# Patient Record
Sex: Female | Born: 1935 | Race: White | Hispanic: No | Smoking: Never smoker
Health system: Southern US, Community
[De-identification: ages and names within clinical notes are randomized; demographics above are authoritative.]

## PROBLEM LIST (undated history)

## (undated) DIAGNOSIS — R739 Hyperglycemia, unspecified: Secondary | ICD-10-CM

## (undated) DIAGNOSIS — I8390 Asymptomatic varicose veins of unspecified lower extremity: Secondary | ICD-10-CM

## (undated) DIAGNOSIS — G629 Polyneuropathy, unspecified: Secondary | ICD-10-CM

## (undated) DIAGNOSIS — K225 Diverticulum of esophagus, acquired: Secondary | ICD-10-CM

## (undated) DIAGNOSIS — R42 Dizziness and giddiness: Secondary | ICD-10-CM

## (undated) DIAGNOSIS — K219 Gastro-esophageal reflux disease without esophagitis: Secondary | ICD-10-CM

## (undated) DIAGNOSIS — I6529 Occlusion and stenosis of unspecified carotid artery: Secondary | ICD-10-CM

## (undated) DIAGNOSIS — R0609 Other forms of dyspnea: Secondary | ICD-10-CM

## (undated) DIAGNOSIS — L299 Pruritus, unspecified: Secondary | ICD-10-CM

## (undated) DIAGNOSIS — I4891 Unspecified atrial fibrillation: Secondary | ICD-10-CM

## (undated) DIAGNOSIS — H35039 Hypertensive retinopathy, unspecified eye: Secondary | ICD-10-CM

## (undated) DIAGNOSIS — R1313 Dysphagia, pharyngeal phase: Secondary | ICD-10-CM

## (undated) DIAGNOSIS — R51 Headache: Secondary | ICD-10-CM

## (undated) DIAGNOSIS — Y92009 Unspecified place in unspecified non-institutional (private) residence as the place of occurrence of the external cause: Secondary | ICD-10-CM

## (undated) DIAGNOSIS — R238 Other skin changes: Secondary | ICD-10-CM

## (undated) DIAGNOSIS — W19XXXA Unspecified fall, initial encounter: Secondary | ICD-10-CM

## (undated) DIAGNOSIS — R140 Abdominal distension (gaseous): Secondary | ICD-10-CM

## (undated) DIAGNOSIS — M419 Scoliosis, unspecified: Secondary | ICD-10-CM

## (undated) DIAGNOSIS — R233 Spontaneous ecchymoses: Secondary | ICD-10-CM

## (undated) DIAGNOSIS — E039 Hypothyroidism, unspecified: Secondary | ICD-10-CM

## (undated) DIAGNOSIS — H3530 Unspecified macular degeneration: Secondary | ICD-10-CM

## (undated) DIAGNOSIS — R490 Dysphonia: Secondary | ICD-10-CM

## (undated) DIAGNOSIS — E7800 Pure hypercholesterolemia, unspecified: Secondary | ICD-10-CM

## (undated) HISTORY — DX: Pruritus, unspecified: L29.9

## (undated) HISTORY — DX: Pure hypercholesterolemia, unspecified: E78.00

## (undated) HISTORY — DX: Hyperglycemia, unspecified: R73.9

## (undated) HISTORY — DX: Dysphonia: R49.0

## (undated) HISTORY — DX: Dizziness and giddiness: R42

## (undated) HISTORY — DX: Gastro-esophageal reflux disease without esophagitis: K21.9

## (undated) HISTORY — DX: Polyneuropathy, unspecified: G62.9

## (undated) HISTORY — DX: Occlusion and stenosis of unspecified carotid artery: I65.29

## (undated) HISTORY — DX: Unspecified macular degeneration: H35.30

## (undated) HISTORY — DX: Unspecified fall, initial encounter: W19.XXXA

## (undated) HISTORY — DX: Asymptomatic varicose veins of unspecified lower extremity: I83.90

## (undated) HISTORY — DX: Other skin changes: R23.8

## (undated) HISTORY — PX: EYE SURGERY: SHX253

## (undated) HISTORY — DX: Headache: R51

## (undated) HISTORY — DX: Spontaneous ecchymoses: R23.3

## (undated) HISTORY — DX: Scoliosis, unspecified: M41.9

## (undated) HISTORY — DX: Hypothyroidism, unspecified: E03.9

## (undated) HISTORY — DX: Other forms of dyspnea: R06.09

## (undated) HISTORY — DX: Hypertensive retinopathy, unspecified eye: H35.039

## (undated) HISTORY — DX: Dysphagia, pharyngeal phase: R13.13

## (undated) HISTORY — DX: Diverticulum of esophagus, acquired: K22.5

## (undated) HISTORY — PX: CATARACT EXTRACTION: SUR2

## (undated) HISTORY — DX: Unspecified atrial fibrillation: I48.91

## (undated) HISTORY — DX: Unspecified place in unspecified non-institutional (private) residence as the place of occurrence of the external cause: Y92.009

## (undated) HISTORY — DX: Abdominal distension (gaseous): R14.0

---
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
## 1995-01-12 HISTORY — PX: CHOLECYSTECTOMY: SHX55

## 1995-01-12 HISTORY — PX: SPINE SURGERY: SHX786

## 1997-06-14 ENCOUNTER — Other Ambulatory Visit: Admission: RE | Admit: 1997-06-14 | Discharge: 1997-06-14 | Payer: Self-pay | Admitting: Cardiology

## 1997-10-14 ENCOUNTER — Inpatient Hospital Stay (HOSPITAL_COMMUNITY): Admission: EM | Admit: 1997-10-14 | Discharge: 1997-10-16 | Payer: Self-pay | Admitting: Cardiology

## 1998-12-29 ENCOUNTER — Encounter: Payer: Self-pay | Admitting: Cardiology

## 1998-12-29 ENCOUNTER — Encounter: Admission: RE | Admit: 1998-12-29 | Discharge: 1998-12-29 | Payer: Self-pay | Admitting: Cardiology

## 1999-01-07 ENCOUNTER — Encounter: Payer: Self-pay | Admitting: Cardiology

## 1999-01-07 ENCOUNTER — Encounter: Admission: RE | Admit: 1999-01-07 | Discharge: 1999-01-07 | Payer: Self-pay | Admitting: Cardiology

## 2000-02-09 ENCOUNTER — Encounter: Admission: RE | Admit: 2000-02-09 | Discharge: 2000-02-09 | Payer: Self-pay | Admitting: Cardiology

## 2000-02-09 ENCOUNTER — Encounter: Payer: Self-pay | Admitting: Cardiology

## 2000-05-18 ENCOUNTER — Ambulatory Visit (HOSPITAL_BASED_OUTPATIENT_CLINIC_OR_DEPARTMENT_OTHER): Admission: RE | Admit: 2000-05-18 | Discharge: 2000-05-18 | Payer: Self-pay | Admitting: Plastic Surgery

## 2000-05-18 ENCOUNTER — Encounter (INDEPENDENT_AMBULATORY_CARE_PROVIDER_SITE_OTHER): Payer: Self-pay | Admitting: *Deleted

## 2000-07-04 ENCOUNTER — Other Ambulatory Visit: Admission: RE | Admit: 2000-07-04 | Discharge: 2000-07-04 | Payer: Self-pay | Admitting: Cardiology

## 2001-03-30 ENCOUNTER — Encounter: Admission: RE | Admit: 2001-03-30 | Discharge: 2001-03-30 | Payer: Self-pay | Admitting: Cardiology

## 2001-03-30 ENCOUNTER — Encounter: Payer: Self-pay | Admitting: Cardiology

## 2002-04-16 ENCOUNTER — Encounter: Payer: Self-pay | Admitting: Cardiology

## 2002-04-16 ENCOUNTER — Encounter: Admission: RE | Admit: 2002-04-16 | Discharge: 2002-04-16 | Payer: Self-pay | Admitting: Cardiology

## 2002-09-19 ENCOUNTER — Encounter: Payer: Self-pay | Admitting: Cardiology

## 2002-09-19 ENCOUNTER — Encounter: Admission: RE | Admit: 2002-09-19 | Discharge: 2002-09-19 | Payer: Self-pay | Admitting: Cardiology

## 2003-09-24 ENCOUNTER — Encounter: Admission: RE | Admit: 2003-09-24 | Discharge: 2003-09-24 | Payer: Self-pay | Admitting: Cardiology

## 2003-10-01 ENCOUNTER — Encounter: Admission: RE | Admit: 2003-10-01 | Discharge: 2003-10-01 | Payer: Self-pay | Admitting: Cardiology

## 2004-11-03 ENCOUNTER — Encounter: Admission: RE | Admit: 2004-11-03 | Discharge: 2004-11-03 | Payer: Self-pay | Admitting: Cardiology

## 2005-02-01 ENCOUNTER — Ambulatory Visit: Payer: Self-pay | Admitting: Physical Medicine & Rehabilitation

## 2005-02-01 ENCOUNTER — Encounter
Admission: RE | Admit: 2005-02-01 | Discharge: 2005-05-02 | Payer: Self-pay | Admitting: Physical Medicine & Rehabilitation

## 2005-03-08 ENCOUNTER — Ambulatory Visit: Payer: Self-pay | Admitting: Physical Medicine & Rehabilitation

## 2005-03-19 ENCOUNTER — Ambulatory Visit: Payer: Self-pay | Admitting: Physical Medicine & Rehabilitation

## 2005-05-07 ENCOUNTER — Encounter
Admission: RE | Admit: 2005-05-07 | Discharge: 2005-05-07 | Payer: Self-pay | Admitting: Physical Medicine and Rehabilitation

## 2005-08-13 ENCOUNTER — Ambulatory Visit (HOSPITAL_COMMUNITY): Admission: RE | Admit: 2005-08-13 | Discharge: 2005-08-13 | Payer: Self-pay | Admitting: Cardiology

## 2006-01-06 ENCOUNTER — Encounter: Admission: RE | Admit: 2006-01-06 | Discharge: 2006-01-06 | Payer: Self-pay | Admitting: Cardiology

## 2006-04-12 ENCOUNTER — Encounter: Admission: RE | Admit: 2006-04-12 | Discharge: 2006-04-12 | Payer: Self-pay | Admitting: Cardiology

## 2006-07-01 ENCOUNTER — Ambulatory Visit: Payer: Self-pay | Admitting: Vascular Surgery

## 2007-01-24 ENCOUNTER — Ambulatory Visit: Payer: Self-pay | Admitting: Vascular Surgery

## 2007-02-09 ENCOUNTER — Encounter: Admission: RE | Admit: 2007-02-09 | Discharge: 2007-02-09 | Payer: Self-pay | Admitting: Cardiology

## 2007-07-26 ENCOUNTER — Encounter: Admission: RE | Admit: 2007-07-26 | Discharge: 2007-07-26 | Payer: Self-pay | Admitting: Gastroenterology

## 2008-01-30 ENCOUNTER — Ambulatory Visit: Payer: Self-pay | Admitting: Vascular Surgery

## 2008-03-20 ENCOUNTER — Encounter: Admission: RE | Admit: 2008-03-20 | Discharge: 2008-03-20 | Payer: Self-pay | Admitting: Cardiology

## 2008-07-03 ENCOUNTER — Encounter: Admission: RE | Admit: 2008-07-03 | Discharge: 2008-07-03 | Payer: Self-pay | Admitting: Orthopedic Surgery

## 2008-09-19 ENCOUNTER — Encounter: Admission: RE | Admit: 2008-09-19 | Discharge: 2008-09-19 | Payer: Self-pay | Admitting: Cardiology

## 2009-01-30 ENCOUNTER — Ambulatory Visit: Payer: Self-pay | Admitting: Vascular Surgery

## 2009-08-06 ENCOUNTER — Encounter: Admission: RE | Admit: 2009-08-06 | Discharge: 2009-08-06 | Payer: Self-pay | Admitting: Emergency Medicine

## 2009-09-01 ENCOUNTER — Ambulatory Visit: Payer: Self-pay | Admitting: Cardiology

## 2009-09-11 ENCOUNTER — Ambulatory Visit: Payer: Self-pay | Admitting: Cardiology

## 2009-10-01 ENCOUNTER — Encounter: Admission: RE | Admit: 2009-10-01 | Discharge: 2009-10-01 | Payer: Self-pay | Admitting: Cardiology

## 2009-10-06 ENCOUNTER — Ambulatory Visit: Payer: Self-pay | Admitting: Cardiology

## 2009-10-13 ENCOUNTER — Ambulatory Visit: Payer: Self-pay | Admitting: Cardiology

## 2009-10-28 ENCOUNTER — Ambulatory Visit: Payer: Self-pay | Admitting: Cardiology

## 2009-11-12 ENCOUNTER — Ambulatory Visit: Payer: Self-pay | Admitting: Cardiology

## 2009-11-27 ENCOUNTER — Ambulatory Visit: Payer: Self-pay | Admitting: Cardiology

## 2009-12-29 ENCOUNTER — Ambulatory Visit: Payer: Self-pay | Admitting: Cardiology

## 2010-01-21 ENCOUNTER — Ambulatory Visit
Admission: RE | Admit: 2010-01-21 | Discharge: 2010-01-21 | Payer: Self-pay | Source: Home / Self Care | Attending: Vascular Surgery | Admitting: Vascular Surgery

## 2010-01-29 ENCOUNTER — Ambulatory Visit: Payer: Self-pay | Admitting: Cardiology

## 2010-01-31 NOTE — Procedures (Unsigned)
CAROTID DUPLEX EXAM  INDICATION:  Followup carotid artery disease.  HISTORY: Diabetes:  No. Cardiac:  Yes. Hypertension:  Yes. Smoking:  No. Previous Surgery:  No. CV History:  The patient is currently asymptomatic. Amaurosis Fugax No, Paresthesias No, Hemiparesis No                                      RIGHT             LEFT Brachial systolic pressure:         132               134 Brachial Doppler waveforms:         WNL               WNL Vertebral direction of flow:        Antegrade DUPLEX VELOCITIES (cm/sec) CCA peak systolic                   62 ECA peak systolic                   85 ICA peak systolic                   106 ICA end diastolic                   35 PLAQUE MORPHOLOGY:                  Heterogenous PLAQUE AMOUNT:                      Mild PLAQUE LOCATION:                    ICA  IMPRESSION:  No hemodynamically significant stenosis on the right internal carotid artery.  Tortuous ICA could be secondary to known occlusion of the left internal carotid artery.  Study is stable compared to previous.         ___________________________________________ Di Kindle. Edilia Bo, M.D.  OD/MEDQ  D:  01/21/2010  T:  01/21/2010  Job:  [id number]

## 2010-02-02 ENCOUNTER — Encounter: Payer: Self-pay | Admitting: Gastroenterology

## 2010-02-25 ENCOUNTER — Encounter (INDEPENDENT_AMBULATORY_CARE_PROVIDER_SITE_OTHER): Payer: Medicare Other

## 2010-02-25 DIAGNOSIS — Z7901 Long term (current) use of anticoagulants: Secondary | ICD-10-CM

## 2010-02-25 DIAGNOSIS — I4891 Unspecified atrial fibrillation: Secondary | ICD-10-CM

## 2010-03-25 ENCOUNTER — Encounter (INDEPENDENT_AMBULATORY_CARE_PROVIDER_SITE_OTHER): Payer: Medicare Other

## 2010-03-25 DIAGNOSIS — Z7901 Long term (current) use of anticoagulants: Secondary | ICD-10-CM

## 2010-03-25 DIAGNOSIS — I4891 Unspecified atrial fibrillation: Secondary | ICD-10-CM

## 2010-04-01 ENCOUNTER — Ambulatory Visit (INDEPENDENT_AMBULATORY_CARE_PROVIDER_SITE_OTHER): Payer: Medicare Other | Admitting: *Deleted

## 2010-04-01 DIAGNOSIS — I4891 Unspecified atrial fibrillation: Secondary | ICD-10-CM

## 2010-04-01 DIAGNOSIS — Z7901 Long term (current) use of anticoagulants: Secondary | ICD-10-CM

## 2010-04-01 LAB — POCT INR: INR: 2.2

## 2010-04-13 ENCOUNTER — Telehealth: Payer: Self-pay | Admitting: Cardiology

## 2010-04-13 NOTE — Telephone Encounter (Signed)
PT SAID HAS COME CONCERN, STAGE PAINS IN THE PAST FEW DAYS AND SOME OTHER THINGS. WANTS TO TALK TO MELINDA.

## 2010-04-14 NOTE — Telephone Encounter (Signed)
Called patient back yesterday (system down) and Dr Patty Sermons will see 4/4

## 2010-04-15 ENCOUNTER — Encounter: Payer: Self-pay | Admitting: Cardiology

## 2010-04-15 ENCOUNTER — Ambulatory Visit (INDEPENDENT_AMBULATORY_CARE_PROVIDER_SITE_OTHER): Payer: Medicare Other | Admitting: Cardiology

## 2010-04-15 VITALS — BP 120/72 | HR 84 | Wt 120.0 lb

## 2010-04-15 DIAGNOSIS — G609 Hereditary and idiopathic neuropathy, unspecified: Secondary | ICD-10-CM

## 2010-04-15 DIAGNOSIS — R233 Spontaneous ecchymoses: Secondary | ICD-10-CM | POA: Insufficient documentation

## 2010-04-15 DIAGNOSIS — R238 Other skin changes: Secondary | ICD-10-CM | POA: Insufficient documentation

## 2010-04-15 DIAGNOSIS — I4891 Unspecified atrial fibrillation: Secondary | ICD-10-CM | POA: Insufficient documentation

## 2010-04-15 DIAGNOSIS — R490 Dysphonia: Secondary | ICD-10-CM | POA: Insufficient documentation

## 2010-04-15 DIAGNOSIS — R140 Abdominal distension (gaseous): Secondary | ICD-10-CM | POA: Insufficient documentation

## 2010-04-15 DIAGNOSIS — L299 Pruritus, unspecified: Secondary | ICD-10-CM | POA: Insufficient documentation

## 2010-04-15 DIAGNOSIS — F419 Anxiety disorder, unspecified: Secondary | ICD-10-CM

## 2010-04-15 DIAGNOSIS — E7800 Pure hypercholesterolemia, unspecified: Secondary | ICD-10-CM

## 2010-04-15 DIAGNOSIS — R42 Dizziness and giddiness: Secondary | ICD-10-CM | POA: Insufficient documentation

## 2010-04-15 DIAGNOSIS — R519 Headache, unspecified: Secondary | ICD-10-CM | POA: Insufficient documentation

## 2010-04-15 DIAGNOSIS — G629 Polyneuropathy, unspecified: Secondary | ICD-10-CM | POA: Insufficient documentation

## 2010-04-15 DIAGNOSIS — E785 Hyperlipidemia, unspecified: Secondary | ICD-10-CM | POA: Insufficient documentation

## 2010-04-15 DIAGNOSIS — G589 Mononeuropathy, unspecified: Secondary | ICD-10-CM

## 2010-04-15 LAB — POCT INR: INR: 2.4

## 2010-04-15 MED ORDER — ALPRAZOLAM 0.25 MG PO TABS: 0.2500 mg | ORAL_TABLET | ORAL | Status: DC

## 2010-04-15 MED ORDER — GABAPENTIN 300 MG PO CAPS: 300.0000 mg | ORAL_CAPSULE | Freq: Two times a day (BID) | ORAL | Status: DC

## 2010-04-15 MED ORDER — ALPRAZOLAM 0.25 MG PO TABS: 0.2500 mg | ORAL_TABLET | Freq: Every day | ORAL | Status: AC

## 2010-04-15 MED ORDER — ALPRAZOLAM 0.25 MG PO TABS: 0.2500 mg | ORAL_TABLET | Freq: Every evening | ORAL | Status: DC | PRN

## 2010-04-15 NOTE — Assessment & Plan Note (Signed)
The patient remains on simvastatin for hypercholesterolemia.  She's not having any myalgias from The simvastatin.

## 2010-04-15 NOTE — Progress Notes (Signed)
History of Present Illness: This pleasant 77 year old widowed Caucasian female is seen for a scheduled followup office visit.  She has a history of established atrial fibrillation.  She has a past history of peripheral neuropathy.  She has a known left carotid artery occlusion.  She's had hypercholesterolemia.  She does not have any coronary disease and she had a normal Cardiolite stress test in 2008.  She's had a history of prior scoliosis and has a metal rod in her spine.  She stays hoarse.  She's had frequent headaches and some dizziness.  She notes occasional racing of her heart at night and takes an extra atenolol occasionally.  She's been having neuropathic shooting pains arising her feet and coming up her legs.  Current Outpatient Prescriptions  Medication Sig Dispense Refill  . aspirin 81 MG tablet Take 81 mg by mouth daily.        Marland Kitchen atenolol (TENORMIN) 25 MG tablet Take 25 mg by mouth daily. 25 MG IN THE AM AND 12.5 MG IN THE PM       . Calcium Carbonate (CALCIUM 500 PO) Take 500 mg by mouth daily.        . hydrOXYzine (ATARAX) 10 MG tablet Take 10 mg by mouth 3 (three) times daily.        . Ibuprofen (ADVIL PO) Take by mouth as needed.        . Multiple Vitamin (MULTIVITAMIN) tablet Take 1 tablet by mouth daily.        . simvastatin (ZOCOR) 20 MG tablet Take 20 mg by mouth at bedtime.        . triamcinolone (KENALOG) 0.1 % cream Apply topically as needed.        . warfarin (COUMADIN) 2 MG tablet Take 2 mg by mouth daily. AS DIRECTED       . DISCONTD: gabapentin (NEURONTIN) 300 MG capsule Take 300 mg by mouth daily.        Marland Kitchen ALPRAZolam (XANAX) 0.25 MG tablet Take 1 tablet (0.25 mg total) by mouth daily.  30 tablet  5  . gabapentin (NEURONTIN) 300 MG capsule Take 1 capsule (300 mg total) by mouth 2 (two) times daily.  60 capsule  11  . DISCONTD: ALPRAZolam (XANAX) 0.25 MG tablet Take 1 tablet (0.25 mg total) by mouth at bedtime as needed for sleep.  30 tablet  0  . DISCONTD: ALPRAZolam  (XANAX) 0.25 MG tablet Take 1 tablet (0.25 mg total) by mouth 1 dose over 46 hours.  30 tablet  5    No Known Allergies  Patient Active Problem List  Diagnoses  . Atrial fibrillation  . Neuropathy  . Carotid artery occlusion  . Hypercholesterolemia  . Scoliosis  . Hoarseness  . Headache  . Dizziness  . Abdominal bloating  . Bruises easily  . Pruritus    History  Smoking status  . Never Smoker   Smokeless tobacco  . Not on file    History  Alcohol Use No    Family History  Problem Relation Age of Onset  . Heart disease      Review of Systems: Constitutional: no fever chills diaphoresis or fatigue or change in weight.  Head and neck: no hearing loss, no epistaxis, no photophobia or visual disturbance. Respiratory: No cough, shortness of breath or wheezing. Cardiovascular: No chest pain peripheral edema, palpitations. Gastrointestinal: No abdominal distention, no abdominal pain, no change in bowel habits hematochezia or melena. Genitourinary: No dysuria, no frequency, no urgency, no nocturia. Musculoskeletal:No  arthralgias, no back pain, no gait disturbance or myalgias. Neurological: No dizziness, no headaches, no numbness, no seizures, no syncope, no weakness, no tremors. Hematologic: No lymphadenopathy, no easy bruising. Psychiatric: No confusion, no hallucinations, no sleep disturbance.    Physical Exam: Filed Vitals:   04/15/10 1506  BP: 120/72  Pulse: 84  Weight is 120, up 1 pound.  This is a well-developed thin woman in no acute distress.Pupils equal and reactive.   Extraocular Movements are full.  There is no scleral icterus.  The mouth and pharynx are normal.  The neck is supple.  The carotids reveal no bruits.  The jugular venous pressure is normal.  The thyroid is not enlarged.  There is no lymphadenopathy.The chest is clear to percussion and auscultation. There are no rales or rhonchi. Expansion of the chest is symmetrical.The precordium is quiet.  The  first heart sound is normal.  The second heart sound is physiologically split.  There is no murmur gallop rub or click.  There is no abnormal lift or heave.The abdomen is soft and nontender. Bowel sounds are normal. The liver and spleen are not enlarged. There Are no abdominal masses. There are no bruits. Normal extremity with good peripheral pulses.  She does have varicose veins bilaterally.  There is no significant edema.Strength is normal and symmetrical in all extremities.  There is no lateralizing weakness.  There are no sensory deficits.   Assessment / Plan: Continue same meds and be rechecked in one month for protime 2 months for protime and 3 months for an office visit and protime.  We did increase her gabapentin to 300 mg twice a day.

## 2010-04-15 NOTE — Assessment & Plan Note (Signed)
She is still having a lot of neuropathic symptoms in her legs.  We will try increasing her gabapentin to 300 mg twice a day

## 2010-04-15 NOTE — Assessment & Plan Note (Signed)
The patient has chronic atrial flutter fibrillation and is on long-term Coumadin.  INR today is therapeutic at 2.4 she has not had any TIA symptoms

## 2010-04-22 ENCOUNTER — Ambulatory Visit: Payer: PRIVATE HEALTH INSURANCE | Admitting: Cardiology

## 2010-04-28 ENCOUNTER — Telehealth: Payer: Self-pay | Admitting: *Deleted

## 2010-04-28 NOTE — Telephone Encounter (Signed)
Patient phoned stating she had bad night last night with her heart.  She said her heart was racing all night and could feel her heart pounding.  Today it is better.  Spoke with Dr. Patty Sermons and will increase her atenolol 25 mg from 1/2 am and 1 pm to 1/2 am, 1/2 at supper and 1 hs. Advised if no better or worse to call back

## 2010-04-29 NOTE — Telephone Encounter (Signed)
Agree with plan 

## 2010-05-13 ENCOUNTER — Ambulatory Visit (INDEPENDENT_AMBULATORY_CARE_PROVIDER_SITE_OTHER): Payer: Medicare Other | Admitting: *Deleted

## 2010-05-13 DIAGNOSIS — I4891 Unspecified atrial fibrillation: Secondary | ICD-10-CM

## 2010-05-13 LAB — POCT INR: INR: 3

## 2010-05-26 NOTE — Procedures (Signed)
CAROTID DUPLEX EXAM   INDICATION:  Followup, carotid artery disease.   HISTORY:  Diabetes:  No.  Cardiac:  Arrhythmia.  Hypertension:  Yes.  Smoking:  No.  Previous Surgery:  No.  CV History:  Amaurosis Fugax No, Paresthesias No, Hemiparesis No                                       RIGHT             LEFT  Brachial systolic pressure:         140               140  Brachial Doppler waveforms:         Biphasic          Biphasic  Vertebral direction of flow:        Antegrade         Antegrade  DUPLEX VELOCITIES (cm/sec)  CCA peak systolic                   102               56  ECA peak systolic                   71                50  ICA peak systolic                   67                Occluded  ICA end diastolic                   21  PLAQUE MORPHOLOGY:                  Mixed             Calcified  PLAQUE AMOUNT:                      Mild              Large  PLAQUE LOCATION:                    ICA               ICA   IMPRESSION:  1. 20-39% right internal carotid artery stenosis.  2. Known left internal carotid artery occlusion.  3. Study essentially unchanged from 02/09/2006.   ___________________________________________  Di Kindle. Edilia Bo, M.D.   DP/MEDQ  D:  01/24/2007  T:  01/24/2007  Job:  [id number]

## 2010-05-26 NOTE — Procedures (Signed)
CAROTID DUPLEX EXAM   INDICATION:  Known left ICA occlusion.   HISTORY:  Diabetes:  No  Cardiac:  Arrhythmia  Hypertension:  Yes  Smoking:  No  Previous Surgery:  No  CV History:  Currently asymptomatic  Amaurosis Fugax No, Paresthesias No, Hemiparesis No                                       RIGHT             LEFT  Brachial systolic pressure:         142               144  Brachial Doppler waveforms:         Normal            Normal  Vertebral direction of flow:        Antegrade         Antegrade  DUPLEX VELOCITIES (cm/sec)  CCA peak systolic                   104               54  ECA peak systolic                   81                56  ICA peak systolic                   66                Occluded  ICA end diastolic                   20  PLAQUE MORPHOLOGY:                  Heterogeneous     Mixed  PLAQUE AMOUNT:                      Mild              Occlusive  PLAQUE LOCATION:                    ICA               ICA   IMPRESSION:  1. 1-39% stenosis of the right internal carotid artery.  2. Known occlusion of the left internal carotid artery.  3. No significant change noted when compared to the previous      examination on January 24, 2007.      ___________________________________________  Di Kindle. Edilia Bo, M.D.   CH/MEDQ  D:  01/30/2008  T:  01/30/2008  Job:  [id number]

## 2010-05-26 NOTE — Consult Note (Signed)
VASCULAR SURGERY CONSULTATION   AMIRI, TRITCH H  DOB:  [birth year]                                       07/01/2006  GNFAO#:[id number]   VASCULAR SURGERY CONSULTATION   DATE OF CONSULTATION:  July 01, 2006.   REASON FOR VISIT:  Vanessa [name] presents today for evaluation of  bilateral venous varicosities.  She is an active, healthy 77 year old  white female with progressive varicosities over both legs.  These are  mostly pretibial.  She does have some new skin changes with diffuse  spotting over both legs from mainly her knees distally which I am not  sure is related to venous hypertension.  She reports no significant  swelling in her legs bilaterally.  She denies any history of  bleeding  or superficial thrombophlebitis.  She does have some generalized ankle  swelling at the end of the day.  She does have pain specifically over  the varicosities in the pretibial regions bilaterally.   PAST MEDICAL HISTORY:  Her past history is significant for cardiac  arrhythmias, no history of myocardial infarction.  She is not diabetic.  She does have hypertension.  She is followed by Dr. Edilia Bo in our  office for asymptomatic occluded left internal carotid artery.   PHYSICAL EXAMINATION:  General:  She is a well-developed, well-nourished  white female appearing younger than stated age of 67.  Vital signs:  Blood pressure 130/74, pulse 72, respirations 16.  Extremities:  Her  lower extremities are notable for no marked swelling.  She does have  tributary varicosities over both pretibial areas.   CLINICAL DATA:  She underwent bilateral venous duplex in our office  which showed no evidence of significant deep venous reflux and no deep  venous thrombosis.  Her saphenous vein was noted for reflux throughout  its course from the saphenofemoral junction from distally on the left.  She did not have any demonstration of reflux in the right leg.   I discussed this with Ms.  Otten with her husband present.  She had  worn compression garments in the past but none recently and I have  recommended the we initially try attempts at bilateral compression  garments to determine if this will show adequate relief.  She will begin  this now and we will see her in three months for further discussion of  this.  If she has adequate relief, we would stop at this.  She will also  elevate and try Advil for her discomfort.  If she does not have an  adequate response, I would recommend laser ablation of her left greater  saphenous vein and stab phlebectomy of her tributary varicosities.  Her  right leg would be treated with stab phlebectomies only since she is not  having any evidence of saphenous vein reflux.  She will attempt initial  conservative treatment and we will see her again in three months for  continued followup.   Larina Earthly, M.D.  Electronically Signed  TFE/MEDQ  D:  07/01/2006  T:  07/04/2006  Job:  [id number]   cc:   Cassell Clement, M.D.

## 2010-05-26 NOTE — Procedures (Signed)
CAROTID DUPLEX EXAM   INDICATION:  Carotid disease.   HISTORY:  Diabetes:  No  Cardiac:  Yes  Hypertension:  Yes  Smoking:  No  Previous Surgery:  No  CV History:  Currently asymptomatic  Amaurosis Fugax No, Paresthesias No, Hemiparesis No                                       RIGHT             LEFT  Brachial systolic pressure:         130               130  Brachial Doppler waveforms:         Normal            Normal  Vertebral direction of flow:        Antegrade         Antegrade  DUPLEX VELOCITIES (cm/sec)  CCA peak systolic                   80                43  ECA peak systolic                   77                103  ICA peak systolic                   72                0ccluded  ICA end diastolic                   17  PLAQUE MORPHOLOGY:                  Mild              Mixed  PLAQUE AMOUNT:                      Mild              Occlusive  PLAQUE LOCATION:                    ICA               ICA   IMPRESSION:  1. Stenosis, 1% to 39%, of the right internal carotid artery.  2. Known occlusion of the left internal carotid artery.  3. No significant change noted when compared to the previous exam on      01/30/2008.   ___________________________________________  Di Kindle. Edilia Bo, M.D.   CH/MEDQ  D:  02/03/2009  T:  02/03/2009  Job:  [id number]

## 2010-05-29 NOTE — Procedures (Signed)
Vanessa [name], Vanessa [name]             ACCOUNT NO.:  [id number]   MEDICAL RECORD NO.:  [id number]          PATIENT TYPE:  REC   LOCATION:  [facility]                          FACILITY:  [facility]   PHYSICIAN:  Erick Colace, M.D.DATE OF BIRTH:  Aug 28, 1920   DATE OF PROCEDURE:  03/22/2005  DATE OF DISCHARGE:                                 OPERATIVE REPORT   Acupuncture treatment #7.   She has low back and buttock pain, left posterior thigh and calf, 8/10 pain,  reduced after last visit to around 3/10 during the morning and 4 to 8 in the  afternoon.  Treatment consisted of bilateral BL21, BL23, BL25, BL27, and  bilateral GB30, electrical stimulation 4 Hz.  Patient tolerated the  procedure well. Weekly, will do five more visits.      Erick Colace, M.D.  Electronically Signed     AEK/MEDQ  D:  03/22/2005 13:44:17  T:  03/23/2005 14:06:15  Job:  [id number]   cc:   Caralyn Guile. Ethelene Hal, M.D.  Fax: [phone]   Cassell Clement, M.D.  Fax: [phone]

## 2010-05-29 NOTE — Procedures (Signed)
[name], [name]             ACCOUNT NO.:  [id number]   MEDICAL RECORD NO.:  [id number]          PATIENT TYPE:  REC   LOCATION:  [facility]                          FACILITY:  [facility]   PHYSICIAN:  Erick Colace, M.D.DATE OF BIRTH:  February 10, 1920   DATE OF PROCEDURE:  02/23/2005  DATE OF DISCHARGE:                                 OPERATIVE REPORT   PROCEDURE:  Acupuncture treatment #1.   Treatment consisted of bilateral BL21, bilateral BL23, BL25, BL27 and GB30.  Electrical stimulation between two sides at 4 Hz and additional stimulation  between GB30 and 27 with 5 Hz alternating with 25 Hz.  Treatment time 20  minutes.   Return next week for repeat treatment.      Erick Colace, M.D.  Electronically Signed     AEK/MEDQ  D:  02/23/2005 13:08:09  T:  02/24/2005 06:10:07  Job:  [id number]   cc:   Caralyn Guile. Ethelene Hal, M.D.  Fax: [phone]

## 2010-05-29 NOTE — Procedures (Signed)
Vanessa [name], Vanessa [name]             ACCOUNT NO.:  [id number]   MEDICAL RECORD NO.:  [id number]          PATIENT TYPE:  REC   LOCATION:  [facility]                          FACILITY:  [facility]   PHYSICIAN:  Erick Colace, M.D.DATE OF BIRTH:  1920-11-28   DATE OF PROCEDURE:  03/29/2005  DATE OF DISCHARGE:                                 OPERATIVE REPORT   Acupuncture treatment #8.   Low back and buttock pain.  Pain level 7 to 10/10.   Treatment consisted of bilateral BL21, BL23, BL25, BL27, bilateral BL30,  electrical stimulation 4 Hz.  Patient tolerated the procedure well, 30  minute treatment.  See her weekly x4 more visit.      Erick Colace, M.D.  Electronically Signed     AEK/MEDQ  D:  03/29/2005 14:54:52  T:  03/30/2005 14:28:19  Job:  [id number]   cc:   Caralyn Guile. Ethelene Hal, M.D.  Fax: [phone]   Cassell Clement, M.D.  Fax: [phone]

## 2010-05-29 NOTE — Procedures (Signed)
NAMEIVADELL, GAUL             ACCOUNT NO.:  [id number]   MEDICAL RECORD NO.:  [id number]          PATIENT TYPE:  REC   LOCATION:  [facility]                          FACILITY:  [facility]   PHYSICIAN:  Erick Colace, M.D.DATE OF BIRTH:  1920/10/13   DATE OF PROCEDURE:  03/04/2005  DATE OF DISCHARGE:                                 OPERATIVE REPORT   MEDICAL RECORD NUMBER:  [id number].   Ms. Whitefield returns for her third acupuncture treatment for pain. Levels  run 8 to 10/10, mainly across the back and left lower extremity. She drives.  She climbs steps with some difficulty.   Treatment today consists of needle placed bilaterally at BL21, BL23, BL25,  BL27 and BL53. Four hertz stimulation for 30 minutes. The patient tolerated  the procedure well. Will see her back next week. If she fails acupuncture  treatment, she may benefit from left sacroiliac joint injection under  fluoroscopic guidance.      Erick Colace, M.D.  Electronically Signed     AEK/MEDQ  D:  03/04/2005 17:14:26  T:  03/05/2005 11:38:45  Job:  [id number]

## 2010-05-29 NOTE — Procedures (Signed)
NAMELOUIS, GAW             ACCOUNT NO.:  [id number]   MEDICAL RECORD NO.:  [id number]          PATIENT TYPE:  REC   LOCATION:  [facility]                          FACILITY:  [facility]   PHYSICIAN:  Erick Colace, M.D.DATE OF BIRTH:  25-Jul-1920   DATE OF PROCEDURE:  04/05/2005  DATE OF DISCHARGE:                                 OPERATIVE REPORT   She follows up for low back, left upper buttock and left lower extremity  pain, pain complaining of 9-10/10.  Last visit it was 7/10.  She has some  increased lower extremity pain today.   Treatment consists of bilateral BL21, BL27, left BL23, left BL25, left BL37  and left BL60, electrical stimulation between needles at 4 Hz x30 minutes.  We will see her in two more weeks and then two more times after that.      Erick Colace, M.D.  Electronically Signed     AEK/MEDQ  D:  04/05/2005 14:27:28  T:  04/07/2005 06:43:54  Job:  [id number]   cc:   Caralyn Guile. Ethelene Hal, M.D.  Fax: [phone]

## 2010-05-29 NOTE — Procedures (Signed)
NAMELANDEN, BREELAND             ACCOUNT NO.:  [id number]   MEDICAL RECORD NO.:  [id number]          PATIENT TYPE:  REC   LOCATION:  [facility]                          FACILITY:  [facility]   PHYSICIAN:  Erick Colace, M.D.DATE OF BIRTH:  [birth year]   DATE OF PROCEDURE:  04/19/2005  DATE OF DISCHARGE:                                 OPERATIVE REPORT   PROCEDURE:  Acupuncture treatment #10.   INDICATIONS:  Low back pain, left buttock pain.  Last visit on March 26,  pain level was 9-10/10, currently 2/10 but averaging 8-9.   The patient placed prone on exam table.  A 40 mm acupuncture needle was  inserted to periosteal contact, PSIS on the left, as well as BL53 on the  left, additional point placed over the left greater trochanter, E-  stimulation 20 Hz alternating with 100 Hz x30 minutes.  The patient  tolerated the procedure well.  Postprocedure instructions given.  The  patient states she is going to [place] to have a neurosurgical  consultation and see if anything more can be done for her back pain.  She  planned to see Dr. Pryor Curia.  I will see her on a p.r.n. basis.  I have  recommended a left sacroiliac joint injection under fluoroscopic guidance as  next step should this last acupuncture treatment not prove to be beneficial.      Erick Colace, M.D.  Electronically Signed     AEK/MEDQ  D:  04/19/2005 15:33:10  T:  04/20/2005 09:07:26  Job:  [id number]   cc:   Caralyn Guile. Ethelene Hal, M.D.  Fax: [phone]

## 2010-05-29 NOTE — Procedures (Signed)
[name], Vanessa [name]             ACCOUNT NO.:  [id number]   MEDICAL RECORD NO.:  [id number]          PATIENT TYPE:  REC   LOCATION:  [facility]                          FACILITY:  [facility]   PHYSICIAN:  Erick Colace, M.D.DATE OF BIRTH:  1920-08-16   DATE OF PROCEDURE:  03/02/2005  DATE OF DISCHARGE:                                 OPERATIVE REPORT   PROCEDURE:  Acupuncture treatment #2 for left-sided low back and buttock  pain, left greater than right-sided buttock pain.   Needles placed bilaterally at BL21, BL23, BL25, BL27 and BL53, 4 Hz  stimulation x30 minutes.  The patient tolerated the procedure well.  Will  return later in the week for repeat treatment.      Erick Colace, M.D.  Electronically Signed     AEK/MEDQ  D:  03/02/2005 12:37:10  T:  03/03/2005 06:23:19  Job:  [id number]

## 2010-05-29 NOTE — Group Therapy Note (Signed)
Consult for acupuncture requested by Caralyn Guile. Ethelene Hal, M.D.   HISTORY OF PRESENT ILLNESS:  Vanessa [name] is a pleasant 77 year old female  who has a history of lumbar scoliosis under which an extensive L3 through  sacrum surgery in 1997 in [place] to correct her scoliosis and fuse her  spine.  She has had gradual recurrence of her back pain over time and has  had left S1 selective nerve root block per Dr. Ethelene Hal on October 19, 2004,  which reportedly was not very helpful.  She states that she had some other  injections (which I do not have records of) that were more helpful.  She has  had medication management including trial Duragesic patch which caused  excessive sedation.  She had ortho spine consult with Sharolyn Douglas, M.D. and no  further surgery was recommended other than perhaps trial of spinal cord  stimulator which the patient also did not want to pursue.  She was tried on  Lidoderm patch which was not particularly helpful for her.  She was  therefore referred for consideration for acupuncture treatment.   Last imaging studies with MRI of thoracic spine on January 13, 2005, minimal  central disk protrusion at T6-7, no cord changes, and no other significant  abnormalities.  MRI of the lumbar spine could not be interpreted due to her  instrumentation.   CURRENT MEDICATIONS:  1.  Atenolol 1/2 tablet p.o. daily.  2.  She has used Aleve.   Her pain is rated as 10 on average, currently 4, interferes with general  activity at 4/10 level, improves with resting, and worse with walking and  sitting.  Relief from medicines about 30%.  She climbs steps, she drives,  she is retired.  Does not require any help for her self-care skills.  Her  pain diagram indicates the pain down her left leg to her ankle and across  her low back buttock area.   PAST MEDICAL HISTORY:  In addition to above, hysterectomy and  cholecystectomy.   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   FAMILY  HISTORY:  Heart disease and high blood pressure.   PHYSICAL EXAMINATION:  GENERAL:  In no acute distress, mood and affect  appropriate.  Frail, elderly female.  BACK:  Shows a long midline scar which is nontender to palpation.  She has  no lumbar paraspinal tenderness.  She does have bilateral PSIS tenderness.  She has PSIS pain with Faber's maneuver bilaterally.  She has negative  straight leg raise test.  She has normal lower extremity reflexes and normal  lower extremity strength from knee and ankle range of motion.   IMPRESSION:  1.  Lumbar post laminectomy syndrome, chronic low back pain as well as left      lower extremity radiculitis.  Has a history of degenerative scoliosis.  2.  Buttock and PSIS pain.  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 Ethelene Hal.   PLAN:  I think it is reasonable to have a trial of acupuncture 4-6 visits at  1-2 per week frequency.  We will do a trial procedure today and see how she  tolerates it.  If acupuncture not successful, would pursue SI jt injections.  The plan was  discussed with Dr. Ethelene Hal.   ADDENDUM:  Needles placed at bilateral BL23, bilateral 52, muscle  stimulation between the two sides 2 HZ.  The patient tolerated the procedure  well, treatment time 20 minutes.  She will return next week.      Vanessa [name], M.D.  Electronically Signed     AEK/MedQ  D:  02/02/2005 13:46:27  T:  02/03/2005 00:53:30  Job #:  [id number]

## 2010-05-29 NOTE — Op Note (Signed)
NAMEDAMONI, ERKER             ACCOUNT NO.:  [id number]   MEDICAL RECORD NO.:  [id number]          PATIENT TYPE:  OIB   LOCATION:  2852                         FACILITY:  [facility]   PHYSICIAN:  Cassell Clement, M.D. DATE OF BIRTH:  04-Feb-1920   DATE OF PROCEDURE:  08/13/2005  DATE OF DISCHARGE:                                 OPERATIVE REPORT   PROCEDURE:  Elective cardioversion.   HISTORY:  This 77 year old woman was found to be in atrial fibrillation at  the end of May 2007.  She has been placed on Coumadin and has had  therapeutic levels.  She has failed to convert to sinus rhythm on outpatient  medications and is therefore brought in to short-stay for an elective  cardioversion.   After being given IV Pentothal by Dr. Sampson Goon and a suitable level of  anesthesia being obtained, the patient was given a single shock of 120  joules using the anterior-posterior pads. She converted promptly to normal  sinus rhythm with occasional PACs.  She tolerated the procedure well and  there were no immediate postanesthetic complications.           ______________________________  Cassell Clement, M.D.     TB/MEDQ  D:  08/13/2005  T:  08/13/2005  Job:  [id number]

## 2010-05-29 NOTE — Procedures (Signed)
NAMECOSTELLA, SCHWARZ             ACCOUNT NO.:  [id number]   MEDICAL RECORD NO.:  [id number]          PATIENT TYPE:  REC   LOCATION:  TPC                          FACILITY:  [facility]   PHYSICIAN:  Erick Colace, M.D.DATE OF BIRTH:  [birth year]   DATE OF PROCEDURE:  03/12/2005  DATE OF DISCHARGE:                                 OPERATIVE REPORT   MEDICAL RECORD NUMBER:  [id number].   Acupuncture treatment #6.   INDICATIONS:  Low back and left buttock pain going into the left posterior  thigh and calf, 8/10 pain, interference score.   Treatment today consisted of left BL21, BL23, BL25, BL27 as well as  bilateral BL53 and left GB30. Electrical stimulation between bilateral BL53  at 100 hertz and between BL21 and BL23, BL25, BL27 on the left at 4 hertz.  The patient tolerated the procedure well.   If no significant improvement after this treatment, would consider left  sacroiliac joint injection under fluoroscopic guidance to evaluate for SI  pain generator. Discussed other treatment options including RF of sacroiliac  joint should injections be temporary.      Erick Colace, M.D.  Electronically Signed     AEK/MEDQ  D:  03/12/2005 14:27:56  T:  03/13/2005 07:05:06  Job:  [id number]

## 2010-05-29 NOTE — Procedures (Signed)
Vanessa [name], FILLA             ACCOUNT NO.:  [id number]   MEDICAL RECORD NO.:  [id number]          PATIENT TYPE:  REC   LOCATION:  TPC                          FACILITY:  [facility]   PHYSICIAN:  Erick Colace, M.D.DATE OF BIRTH:  1920-09-26   DATE OF PROCEDURE:  03/09/2005  DATE OF DISCHARGE:                                 OPERATIVE REPORT   MEDICAL RECORD NUMBER:  [id number].   This is acupuncture treatment #5. Pain level still running around 8 to 10,  low back, buttock and left lower extremity. She continues to drive short  distances. She climbs steps. She ambulates without assistive device.   Treatment today consists of needles placed bilateral BL21 and BL27 and on  the left side at [facility] and GB30 and GB29. In addition, points placed at BL37  and BL40. Electrical stimulation 4 hertz x30 minutes. The patient tolerated  the procedure well and will return next week for another treatment. Should  she not start to show any reduction in her overall pain scores, would then  consider sacroiliac joint injection.      Erick Colace, M.D.  Electronically Signed     AEK/MEDQ  D:  03/09/2005 13:38:26  T:  03/10/2005 10:30:13  Job:  [id number]

## 2010-05-29 NOTE — Procedures (Signed)
NAMEKEMYA, SHED             ACCOUNT NO.:  [id number]   MEDICAL RECORD NO.:  [id number]          PATIENT TYPE:  REC   LOCATION:  TPC                          FACILITY:  [facility]   PHYSICIAN:  Erick Colace, M.D.DATE OF BIRTH:  11-15-1920   DATE OF PROCEDURE:  02/26/2005  DATE OF DISCHARGE:                                 OPERATIVE REPORT   MEDICAL RECORD NUMBER:  [id number].   Acupuncture treatment #2. Treatment consists of bilateral BL21, BL23, BL25,  BL27 and BL53. Electrical stimulation at 4 hertz for 30 minutes. The patient  tolerated the procedure well. Return next week.      Erick Colace, M.D.  Electronically Signed     AEK/MEDQ  D:  02/26/2005 13:31:31  T:  02/26/2005 15:15:37  Job:  [id number]

## 2010-06-10 ENCOUNTER — Ambulatory Visit (INDEPENDENT_AMBULATORY_CARE_PROVIDER_SITE_OTHER): Payer: Medicare Other | Admitting: *Deleted

## 2010-06-10 DIAGNOSIS — I4891 Unspecified atrial fibrillation: Secondary | ICD-10-CM

## 2010-06-10 LAB — POCT INR: INR: 2.5

## 2010-06-16 ENCOUNTER — Encounter: Payer: PRIVATE HEALTH INSURANCE | Admitting: *Deleted

## 2010-06-26 ENCOUNTER — Encounter: Payer: Self-pay | Admitting: Cardiology

## 2010-06-26 ENCOUNTER — Ambulatory Visit (INDEPENDENT_AMBULATORY_CARE_PROVIDER_SITE_OTHER): Payer: Medicare Other | Admitting: Cardiology

## 2010-06-26 DIAGNOSIS — G629 Polyneuropathy, unspecified: Secondary | ICD-10-CM

## 2010-06-26 DIAGNOSIS — M1990 Unspecified osteoarthritis, unspecified site: Secondary | ICD-10-CM | POA: Insufficient documentation

## 2010-06-26 DIAGNOSIS — I4891 Unspecified atrial fibrillation: Secondary | ICD-10-CM

## 2010-06-26 DIAGNOSIS — G589 Mononeuropathy, unspecified: Secondary | ICD-10-CM

## 2010-06-26 DIAGNOSIS — Z8673 Personal history of transient ischemic attack (TIA), and cerebral infarction without residual deficits: Secondary | ICD-10-CM

## 2010-06-26 DIAGNOSIS — I7300 Raynaud's syndrome without gangrene: Secondary | ICD-10-CM

## 2010-06-26 MED ORDER — ASPIRIN 81 MG PO TABS: 81.0000 mg | ORAL_TABLET | ORAL | Status: DC

## 2010-06-26 NOTE — Assessment & Plan Note (Signed)
The patient has established atrial flutter fibrillation.  She has not been expressing any chest pain or angina.  She's not having any symptoms of congestive heart failure.  She remains on long-term Coumadin.

## 2010-06-26 NOTE — Progress Notes (Signed)
Vanessa [name] Date of Birth:  11-03-1920 [facility] [address]   [address] [place], [place]  [address] [phone]           Fax   [phone]  History of Present Illness: This 77 year old Caucasian female returns for a four-month followup office visit.  She has a complex past medical history.  He has a history of established atrial fibrillation.  She also has a history of suspected peripheral neuropathy although she has not responded to gabapentin.  She's had a history of known left carotid artery occlusion which was asymptomatic.  She's had hypercholesterolemia.  She does not have any history of known coronary artery disease and she had a normal Cardiolite stress test in 2008.  She has a past history of scoliosis and has a metal rod in her spine for that.  She's had problems with her voice and she stays hoarse.  She's had occasional palpitations of her heart at night and she can take an extra beta blocker with relief.  Recently she's had some symptoms suggestive of Raynaud's phenomenon her last echocardiogram was 06/10/05 and showed an ejection fraction of 55-60% with normal systolic function and mild aortic sclerosis with trivial aortic insufficiency and normal pulmonary artery pressure.  Current Outpatient Prescriptions  Medication Sig Dispense Refill  . aspirin 81 MG tablet Take 1 tablet (81 mg total) by mouth every Monday, Wednesday, and Friday.      Marland Kitchen atenolol (TENORMIN) 25 MG tablet Take 25 mg by mouth daily. Taking 2 daily      . Calcium Carbonate (CALCIUM 500 PO) Take 500 mg by mouth daily.        . hydrOXYzine (ATARAX) 10 MG tablet Take 10 mg by mouth 3 (three) times daily.        . Ibuprofen (ADVIL PO) Take by mouth as needed.        . Multiple Vitamin (MULTIVITAMIN) tablet Take 1 tablet by mouth daily.        . simvastatin (ZOCOR) 20 MG tablet Take 20 mg by mouth at bedtime.        . triamcinolone (KENALOG) 0.1 % cream Apply topically as needed.         . warfarin (COUMADIN) 2 MG tablet Take 2 mg by mouth daily. AS DIRECTED       . DISCONTD: aspirin 81 MG tablet Take 81 mg by mouth daily.        Marland Kitchen DISCONTD: gabapentin (NEURONTIN) 300 MG capsule Take 1 capsule (300 mg total) by mouth 2 (two) times daily.  60 capsule  11  . DISCONTD: gabapentin (NEURONTIN) 300 MG capsule Take 300 mg by mouth 2 (two) times daily. Taking one at HS         No Known Allergies  Patient Active Problem List  Diagnoses  . Atrial fibrillation  . Neuropathy  . Carotid artery occlusion  . Hypercholesterolemia  . Scoliosis  . Hoarseness  . Headache  . Dizziness  . Abdominal bloating  . Bruises easily  . Pruritus  . Raynaud phenomenon  . Osteoarthritis    History  Smoking status  . Never Smoker   Smokeless tobacco  . Not on file    History  Alcohol Use No    Family History  Problem Relation Age of Onset  . Heart disease      Review of Systems: Constitutional: no fever chills diaphoresis or fatigue or change in weight.  Head and neck: no hearing loss,  no epistaxis, no photophobia or visual disturbance. Respiratory: No cough, shortness of breath or wheezing. Cardiovascular: No chest pain peripheral edema, palpitations. Gastrointestinal: No abdominal distention, no abdominal pain, no change in bowel habits hematochezia or melena. Genitourinary: No dysuria, no frequency, no urgency, no nocturia. Musculoskeletal:No arthralgias, no back pain, no gait disturbance or myalgias. Neurological: No dizziness, no headaches, no numbness, no seizures, no syncope, no weakness, no tremors. Hematologic: No lymphadenopathy, no easy bruising. Psychiatric: No confusion, no hallucinations, no sleep disturbance.    Physical Exam: Filed Vitals:   06/26/10 1439  BP: 120/80  Pulse: 84  The general appearance reveals a well-developed well-nourished thin woman in no acute distress.Pupils equal and reactive.   Extraocular Movements are full.  There is no scleral  icterus.  The mouth and pharynx are normal.  The neck is supple.  The carotids reveal no bruits.  The jugular venous pressure is normal.  The thyroid is not enlarged.  There is no lymphadenopathy.The chest is clear to percussion and auscultation. There are no rales or rhonchi. Expansion of the chest is symmetrical.  Heart reveals an irregular rhythm.  There is a soft systolic ejection murmur at the base.  No gallop or rub.The abdomen is soft and nontender. Bowel sounds are normal. The liver and spleen are not enlarged. There Are no abdominal masses. There are no bruits.  Extremities reveal trace edema.  Pedal pulses are present.  Integument reveals marked easy bruising.   Assessment / Plan: Continue same medication except stop gabapentin because it apparently has been ineffective in her symptoms.  Also we will cut back on her baby aspirin to just Monday Wednesday and Friday to cut back on easy spontaneous bruising.  Continue full dose Coumadin for atrial fibrillation

## 2010-06-26 NOTE — Assessment & Plan Note (Signed)
The patient has a history of osteoarthritis with painful bony protuberances in her feet.  She has an appointment soon to See Dr. Darrelyn Hillock concerning any further treatment which may be indicated.

## 2010-06-26 NOTE — Assessment & Plan Note (Signed)
The patient has been having recent symptoms suggestive of Raynaud's phenomenon in her fingers.  If she grabs a cold drink her terminal digits on the affected hand turn cold and white.  She has not been having any Raynaud's of her feet.  We talked about possibly adding amlodipine but decided against it because her blood pressure is not high enough to support it and she already has minimal ankle edema.  She will try to avoid cold objects

## 2010-06-26 NOTE — Assessment & Plan Note (Signed)
The patient has been told in the past that she has peripheral neuropathy.  We had placed her on gabapentin but she cannot tell that it has done any good.  It may also be making her sleepy.  We will stop the gabapentin at this point and observe.

## 2010-07-08 ENCOUNTER — Ambulatory Visit (INDEPENDENT_AMBULATORY_CARE_PROVIDER_SITE_OTHER): Payer: Medicare Other | Admitting: *Deleted

## 2010-07-08 DIAGNOSIS — I4891 Unspecified atrial fibrillation: Secondary | ICD-10-CM

## 2010-07-08 LAB — POCT INR: INR: 3.8

## 2010-07-16 ENCOUNTER — Ambulatory Visit (INDEPENDENT_AMBULATORY_CARE_PROVIDER_SITE_OTHER): Payer: Medicare Other | Admitting: *Deleted

## 2010-07-16 DIAGNOSIS — I4891 Unspecified atrial fibrillation: Secondary | ICD-10-CM

## 2010-07-16 LAB — POCT INR: INR: 2.4

## 2010-07-30 ENCOUNTER — Ambulatory Visit (INDEPENDENT_AMBULATORY_CARE_PROVIDER_SITE_OTHER): Payer: Medicare Other | Admitting: *Deleted

## 2010-07-30 DIAGNOSIS — I4891 Unspecified atrial fibrillation: Secondary | ICD-10-CM

## 2010-07-30 LAB — POCT INR: INR: 2.4

## 2010-08-06 ENCOUNTER — Telehealth: Payer: Self-pay | Admitting: Cardiology

## 2010-08-06 NOTE — Telephone Encounter (Signed)
PT SAID HAVING SOME ISSUES AND WANTS TO SEE DR BB BEFORE AUGUST. EXPLAIN NO AVAILABLE APPTS TO MOVE HER TO. SHE WANTS TO SW MELINDA. CALL PT BACK AT HOME # .

## 2010-08-06 NOTE — Telephone Encounter (Signed)
Patient just wanted to discuss several things.  Advised no appointment available.  Did offer her something a little sooner, but she will just keep scheduled appointment

## 2010-08-26 ENCOUNTER — Ambulatory Visit (INDEPENDENT_AMBULATORY_CARE_PROVIDER_SITE_OTHER): Payer: Medicare Other | Admitting: *Deleted

## 2010-08-26 DIAGNOSIS — I4891 Unspecified atrial fibrillation: Secondary | ICD-10-CM

## 2010-08-26 LAB — POCT INR: INR: 2.3

## 2010-09-18 ENCOUNTER — Telehealth: Payer: Self-pay | Admitting: Cardiology

## 2010-09-18 NOTE — Telephone Encounter (Signed)
Advised to try [facility]

## 2010-09-18 NOTE — Telephone Encounter (Signed)
Pt wants to let Juliette Alcide know that she got an appt at [facility].

## 2010-09-18 NOTE — Telephone Encounter (Signed)
Called wanting to know if you would give her a referral to an OB/GYN. Please call back. I have pulled her chart.

## 2010-09-30 ENCOUNTER — Ambulatory Visit (INDEPENDENT_AMBULATORY_CARE_PROVIDER_SITE_OTHER): Payer: Medicare Other | Admitting: *Deleted

## 2010-09-30 ENCOUNTER — Encounter: Payer: Self-pay | Admitting: Cardiology

## 2010-09-30 ENCOUNTER — Ambulatory Visit (INDEPENDENT_AMBULATORY_CARE_PROVIDER_SITE_OTHER): Payer: Medicare Other | Admitting: Cardiology

## 2010-09-30 ENCOUNTER — Encounter: Payer: Medicare Other | Admitting: *Deleted

## 2010-09-30 VITALS — BP 124/78 | HR 78 | Wt 118.0 lb

## 2010-09-30 DIAGNOSIS — R42 Dizziness and giddiness: Secondary | ICD-10-CM

## 2010-09-30 DIAGNOSIS — E7800 Pure hypercholesterolemia, unspecified: Secondary | ICD-10-CM

## 2010-09-30 DIAGNOSIS — I4891 Unspecified atrial fibrillation: Secondary | ICD-10-CM

## 2010-09-30 DIAGNOSIS — G589 Mononeuropathy, unspecified: Secondary | ICD-10-CM

## 2010-09-30 DIAGNOSIS — E785 Hyperlipidemia, unspecified: Secondary | ICD-10-CM

## 2010-09-30 DIAGNOSIS — G629 Polyneuropathy, unspecified: Secondary | ICD-10-CM

## 2010-09-30 LAB — POCT INR: INR: 2.5

## 2010-09-30 NOTE — Assessment & Plan Note (Signed)
Patient has a history of hypercholesterolemia.  She is on low dose simvastatin 20 mg daily.  She's not having any myalgias from the simvastatin.

## 2010-09-30 NOTE — Progress Notes (Signed)
Vanessa [name] Date of Birth:  September 14, 1920 [facility] / [facility] [address]   [address] [place], [place]  [address] [phone]           Fax   [phone]  History of Present Illness: This pleasant 77 year old Caucasian female is seen for a scheduled followup office visit.  She has a history of established atrial flutter fibrillation.  She is on long-term Coumadin.  Her INR today is therapeutic at 2.5.  She has not had any TIA symptoms.  She's had no recent chest pain.  She does complain of fatigue.  She's also had problems with vertigo.  She's also had problems with chronically enlarged bilateral varicose veins.  She was recently found to have a basal cell cancer in the right nasal labial fold and had surgery by Dr. Irene Limbo.  Lesion behind the left ear which apparently is going to require some future dermatologic surgery.  She said a painful right foot with a cystic growth on the dorsal surface of the right foot and she will see an orthopedic foot specialist.  She has seen a podiatrist who suggested surgery.  Current Outpatient Prescriptions  Medication Sig Dispense Refill  . ALPRAZolam (XANAX) 0.25 MG tablet Take 0.25 mg by mouth at bedtime as needed.        Marland Kitchen aspirin 81 MG tablet Take 1 tablet (81 mg total) by mouth every Monday, Wednesday, and Friday.      Marland Kitchen atenolol (TENORMIN) 25 MG tablet Take 25 mg by mouth daily. 1/2 in am and 1 pm      . Calcium Carbonate (CALCIUM 500 PO) Take 500 mg by mouth daily.        . hydrOXYzine (ATARAX) 10 MG tablet Take 10 mg by mouth 3 (three) times daily.        . Ibuprofen (ADVIL PO) Take by mouth as needed.        . Multiple Vitamin (MULTIVITAMIN) tablet Take 1 tablet by mouth daily.        . simvastatin (ZOCOR) 20 MG tablet Take 20 mg by mouth at bedtime.        . triamcinolone (KENALOG) 0.1 % cream Apply topically as needed.        . warfarin (COUMADIN) 2 MG tablet Take 2 mg by mouth daily. AS DIRECTED         No Known  Allergies  Patient Active Problem List  Diagnoses  . Atrial fibrillation  . Neuropathy  . Carotid artery occlusion  . Hypercholesterolemia  . Scoliosis  . Hoarseness  . Headache  . Dizziness  . Abdominal bloating  . Bruises easily  . Pruritus  . Raynaud phenomenon  . Osteoarthritis    History  Smoking status  . Never Smoker   Smokeless tobacco  . Not on file    History  Alcohol Use No    Family History  Problem Relation Age of Onset  . Heart disease      Review of Systems: Constitutional: no fever chills diaphoresis or fatigue or change in weight.  Head and neck: no hearing loss, no epistaxis, no photophobia or visual disturbance. Respiratory: No cough, shortness of breath or wheezing. Cardiovascular: No chest pain peripheral edema, palpitations. Gastrointestinal: No abdominal distention, no abdominal pain, no change in bowel habits hematochezia or melena. Genitourinary: No dysuria, no frequency, no urgency, no nocturia. Musculoskeletal:No arthralgias, no back pain, no gait disturbance or myalgias. Neurological: No dizziness, no headaches, no numbness, no seizures, no syncope,  no weakness, no tremors. Hematologic: No lymphadenopathy, no easy bruising. Psychiatric: No confusion, no hallucinations, no sleep disturbance.    Physical Exam: Filed Vitals:   09/30/10 1120  BP: 124/78  Pulse: 78  The general appearance feels a well-developed well-nourished thin woman in no acute distress.Pupils equal and reactive.   Extraocular Movements are full.  There is no scleral icterus.  The mouth and pharynx are normal.  The neck is supple.  The carotids reveal no bruits.  The jugular venous pressure is normal.  The thyroid is not enlarged.  There is no lymphadenopathy.The incision along the right nasolabial fold appears to be healing very well.The chest is clear to percussion and auscultation. There are no rales or rhonchi. Expansion of the chest is symmetrical.  The  precordium is quiet.  The first heart sound is normal.  The second heart sound is physiologically split.  There is no murmur gallop rub or click.  There is no abnormal lift or heave.The rhythm is irregular.The abdomen is soft and nontender. Bowel sounds are normal. The liver and spleen are not enlarged. There Are no abdominal masses. There are no bruits.  The pedal pulses are good.  There is no phlebitis or edema.  There is no cyanosis or clubbing.She has marked bilateral varicose veins but no phlebitis.  She has a small cystic growth on the dorsum of the right foot.Strength is normal and symmetrical in all extremities.  There is no lateralizing weakness.  There are no sensory deficits.           Assessment / Plan: Continue same medication.  Recheck in 4 months for followup office visit.

## 2010-09-30 NOTE — Assessment & Plan Note (Signed)
The patient continues to have spells of vertigo if she turns her neck quickly.  She has not had any recent falls.

## 2010-09-30 NOTE — Assessment & Plan Note (Signed)
She has a history of peripheral neuropathy.  She previously had been on gabapentin but was not convinced that it was helping at all and is presently off gabapentin per she will await the opinion of her orthopedic foot specialist before going on the medication again.

## 2010-10-05 ENCOUNTER — Telehealth: Payer: Self-pay | Admitting: Cardiology

## 2010-10-05 DIAGNOSIS — R42 Dizziness and giddiness: Secondary | ICD-10-CM

## 2010-10-05 MED ORDER — MECLIZINE HCL 25 MG PO TABS: 25.0000 mg | ORAL_TABLET | Freq: Three times a day (TID) | ORAL | Status: AC | PRN

## 2010-10-05 NOTE — Telephone Encounter (Signed)
Pt wants to talk to you about something wouldn't tell me what

## 2010-10-05 NOTE — Telephone Encounter (Signed)
Try adding meclizine 25 mg one every 8 hours as necessary for dizziness or vertigo #20 refill x2

## 2010-10-05 NOTE — Telephone Encounter (Signed)
Advised patient

## 2010-10-05 NOTE — Telephone Encounter (Signed)
Bad night last night.  Had a lot of dizziness last night when she would get up to go to restroom.  Stated you two discussed at last ov, but was worse last night.  Did happen sometimes when she would just roll over in bed.  The bad night was secondary to not being able to sleep.  Today seems to be some better.  Please advise

## 2010-10-12 ENCOUNTER — Other Ambulatory Visit: Payer: Self-pay | Admitting: Cardiology

## 2010-10-12 DIAGNOSIS — Z1231 Encounter for screening mammogram for malignant neoplasm of breast: Secondary | ICD-10-CM

## 2010-10-28 ENCOUNTER — Ambulatory Visit (INDEPENDENT_AMBULATORY_CARE_PROVIDER_SITE_OTHER): Payer: Medicare Other | Admitting: *Deleted

## 2010-10-28 DIAGNOSIS — I4891 Unspecified atrial fibrillation: Secondary | ICD-10-CM

## 2010-10-28 LAB — POCT INR: INR: 2.4

## 2010-11-04 ENCOUNTER — Ambulatory Visit
Admission: RE | Admit: 2010-11-04 | Discharge: 2010-11-04 | Disposition: A | Discharge status: Home / Self Care | Payer: Medicare Other | Source: Ambulatory Visit | Attending: Cardiology | Admitting: Cardiology

## 2010-11-04 DIAGNOSIS — Z1231 Encounter for screening mammogram for malignant neoplasm of breast: Secondary | ICD-10-CM

## 2010-11-10 ENCOUNTER — Telehealth: Payer: Self-pay | Admitting: *Deleted

## 2010-11-10 NOTE — Progress Notes (Signed)
Advised of mammogram results

## 2010-11-10 NOTE — Telephone Encounter (Signed)
Message copied by Burnell Blanks on Tue Nov 10, 2010 11:26 AM ------      Message from: Cassell Clement      Created: Sun Nov 08, 2010  3:28 PM       Please report.  The mammogram was normal.

## 2010-11-10 NOTE — Telephone Encounter (Signed)
Advised of mammogram results

## 2010-11-17 ENCOUNTER — Telehealth: Payer: Self-pay | Admitting: Cardiology

## 2010-11-17 NOTE — Telephone Encounter (Signed)
Advised would do for patient

## 2010-11-17 NOTE — Telephone Encounter (Signed)
Pt calling stating that her handicap renewal is coming up in December and pt wanted to check and make sure MD/nurse got it for her to sign.   If renewal form for handicap sticker is not received. Please return pt call to discuss further.

## 2010-11-25 ENCOUNTER — Other Ambulatory Visit: Payer: Self-pay | Admitting: Cardiology

## 2010-11-25 NOTE — Telephone Encounter (Signed)
Refilled warfarin

## 2010-12-09 ENCOUNTER — Ambulatory Visit (INDEPENDENT_AMBULATORY_CARE_PROVIDER_SITE_OTHER): Payer: Medicare Other | Admitting: *Deleted

## 2010-12-09 DIAGNOSIS — I4891 Unspecified atrial fibrillation: Secondary | ICD-10-CM

## 2010-12-09 LAB — POCT INR: INR: 2.4

## 2010-12-29 ENCOUNTER — Other Ambulatory Visit: Payer: Self-pay | Admitting: *Deleted

## 2010-12-29 DIAGNOSIS — F419 Anxiety disorder, unspecified: Secondary | ICD-10-CM

## 2010-12-29 MED ORDER — ALPRAZOLAM 0.25 MG PO TABS: 0.2500 mg | ORAL_TABLET | Freq: Every day | ORAL | Status: DC | PRN

## 2010-12-29 NOTE — Telephone Encounter (Signed)
Refill Rx per fax request

## 2011-01-12 ENCOUNTER — Ambulatory Visit (INDEPENDENT_AMBULATORY_CARE_PROVIDER_SITE_OTHER): Payer: Medicare Other

## 2011-01-12 DIAGNOSIS — J301 Allergic rhinitis due to pollen: Secondary | ICD-10-CM

## 2011-01-18 DIAGNOSIS — G909 Disorder of the autonomic nervous system, unspecified: Secondary | ICD-10-CM | POA: Diagnosis not present

## 2011-01-18 DIAGNOSIS — M19079 Primary osteoarthritis, unspecified ankle and foot: Secondary | ICD-10-CM | POA: Diagnosis not present

## 2011-01-18 DIAGNOSIS — L851 Acquired keratosis [keratoderma] palmaris et plantaris: Secondary | ICD-10-CM | POA: Diagnosis not present

## 2011-01-20 ENCOUNTER — Telehealth: Payer: Self-pay | Admitting: *Deleted

## 2011-01-20 ENCOUNTER — Other Ambulatory Visit: Payer: Self-pay | Admitting: *Deleted

## 2011-01-20 ENCOUNTER — Ambulatory Visit (INDEPENDENT_AMBULATORY_CARE_PROVIDER_SITE_OTHER): Payer: Medicare Other | Admitting: *Deleted

## 2011-01-20 DIAGNOSIS — I4891 Unspecified atrial fibrillation: Secondary | ICD-10-CM | POA: Diagnosis not present

## 2011-01-20 LAB — POCT INR: INR: 2.5

## 2011-01-20 NOTE — Telephone Encounter (Signed)
Advised patient

## 2011-01-20 NOTE — Telephone Encounter (Signed)
Patient stated the other evening she took her Metoprolol 25 mg and took a shower. When she got out of the shower she felt her heart was pounding very fast.  Episode lasted for about 5 minutes.  She takes Metoprolol 25 mg 1/2 in am and 1 pm.  Was concerned about this and frightened her. Any changes or suggestions

## 2011-01-20 NOTE — Telephone Encounter (Signed)
Try increasing the metoprolol to 25 mg tablet twice a day and see if rapid heart rate episodes subside.

## 2011-01-20 NOTE — Telephone Encounter (Signed)
Patient actually takes Atenolol 25 mg not metoprolol 25 mg as noted in previous phone call.  Will increase to Atenolol 25 mg twice daily per  Dr. Patty Sermons

## 2011-01-22 DIAGNOSIS — Z961 Presence of intraocular lens: Secondary | ICD-10-CM | POA: Diagnosis not present

## 2011-01-22 DIAGNOSIS — H02059 Trichiasis without entropian unspecified eye, unspecified eyelid: Secondary | ICD-10-CM | POA: Diagnosis not present

## 2011-01-22 DIAGNOSIS — D3130 Benign neoplasm of unspecified choroid: Secondary | ICD-10-CM | POA: Diagnosis not present

## 2011-02-02 ENCOUNTER — Ambulatory Visit (INDEPENDENT_AMBULATORY_CARE_PROVIDER_SITE_OTHER): Payer: Medicare Other | Admitting: Cardiology

## 2011-02-02 ENCOUNTER — Ambulatory Visit: Payer: Medicare Other | Admitting: Cardiology

## 2011-02-02 ENCOUNTER — Encounter: Payer: Self-pay | Admitting: Cardiology

## 2011-02-02 ENCOUNTER — Other Ambulatory Visit: Payer: Medicare Other | Admitting: *Deleted

## 2011-02-02 VITALS — BP 136/87 | HR 65 | Ht 62.0 in | Wt 122.0 lb

## 2011-02-02 DIAGNOSIS — E7800 Pure hypercholesterolemia, unspecified: Secondary | ICD-10-CM

## 2011-02-02 DIAGNOSIS — I4891 Unspecified atrial fibrillation: Secondary | ICD-10-CM | POA: Diagnosis not present

## 2011-02-02 DIAGNOSIS — L299 Pruritus, unspecified: Secondary | ICD-10-CM

## 2011-02-02 DIAGNOSIS — I6529 Occlusion and stenosis of unspecified carotid artery: Secondary | ICD-10-CM

## 2011-02-02 DIAGNOSIS — E785 Hyperlipidemia, unspecified: Secondary | ICD-10-CM

## 2011-02-02 LAB — LIPID PANEL
Cholesterol: 185 mg/dL (ref 0–200)
HDL: 76.9 mg/dL (ref >39.00)
LDL Cholesterol: 90 mg/dL (ref 0–99)
Total CHOL/HDL Ratio: 2
Triglycerides: 91 mg/dL (ref 0.0–149.0)
VLDL: 18.2 mg/dL (ref 0.0–40.0)

## 2011-02-02 LAB — CBC WITH DIFFERENTIAL/PLATELET
Basophils Absolute: 0.1 10*3/uL (ref 0.0–0.1)
Basophils Relative: 0.8 % (ref 0.0–3.0)
Eosinophils Absolute: 0.1 10*3/uL (ref 0.0–0.7)
Eosinophils Relative: 1.7 % (ref 0.0–5.0)
HCT: 38.5 % (ref 36.0–46.0)
Hemoglobin: 13.1 g/dL (ref 12.0–15.0)
Lymphocytes Relative: 20.2 % (ref 12.0–46.0)
Lymphs Abs: 1.5 10*3/uL (ref 0.7–4.0)
MCHC: 33.9 g/dL (ref 30.0–36.0)
MCV: 98.3 fl (ref 78.0–100.0)
Monocytes Absolute: 0.9 10*3/uL (ref 0.1–1.0)
Monocytes Relative: 12.6 % — ABNORMAL HIGH (ref 3.0–12.0)
Neutro Abs: 4.8 10*3/uL (ref 1.4–7.7)
Neutrophils Relative %: 64.7 % (ref 43.0–77.0)
Platelets: 218 10*3/uL (ref 150.0–400.0)
RBC: 3.91 Mil/uL (ref 3.87–5.11)
RDW: 14.1 % (ref 11.5–14.6)
WBC: 7.4 10*3/uL (ref 4.5–10.5)

## 2011-02-02 LAB — HEPATIC FUNCTION PANEL
ALT: 17 U/L (ref 0–35)
AST: 32 U/L (ref 0–37)
Albumin: 3.6 g/dL (ref 3.5–5.2)
Alkaline Phosphatase: 86 U/L (ref 39–117)
Bilirubin, Direct: 0 mg/dL (ref 0.0–0.3)
Total Bilirubin: 0.5 mg/dL (ref 0.3–1.2)
Total Protein: 6.8 g/dL (ref 6.0–8.3)

## 2011-02-02 LAB — BASIC METABOLIC PANEL
BUN: 19 mg/dL (ref 6–23)
CO2: 28 mEq/L (ref 19–32)
Calcium: 9.3 mg/dL (ref 8.4–10.5)
Chloride: 105 mEq/L (ref 96–112)
Creatinine, Ser: 0.9 mg/dL (ref 0.4–1.2)
GFR: 63.47 mL/min (ref >60.00)
Glucose, Bld: 83 mg/dL (ref 70–99)
Potassium: 4.5 mEq/L (ref 3.5–5.1)
Sodium: 140 mEq/L (ref 135–145)

## 2011-02-02 LAB — T4, FREE: Free T4: 0.81 ng/dL (ref 0.60–1.60)

## 2011-02-02 LAB — TSH: TSH: 3.34 u[IU]/mL (ref 0.35–5.50)

## 2011-02-02 NOTE — Assessment & Plan Note (Signed)
The patient has been seeing Dr. Amy Swaziland regarding her pruritus.  Presently she is using a cream containing Cetaphil and triamcinolone and 1 additional ingredient.  She has not had her thyroid functions checked in more than a year and we will update her thyroid function today.  She has also had hoarseness and we want to be sure that she is not hypothyroid.

## 2011-02-02 NOTE — Assessment & Plan Note (Signed)
She is followed on an annual basis by [facility] concerning followup Dopplers for her known left carotid artery occlusion.  No new neurologic signs or symptoms noted by patient

## 2011-02-02 NOTE — Progress Notes (Signed)
Vanessa [name] Date of Birth:  08/21/1920 [facility] [address] [place], [place]  [address] [phone]         Fax   [phone]  History of Present Illness: This pleasant 78 year old widowed Caucasian female is seen for a scheduled followup office visit.  She is a past history of established atrial flutter fibrillation.  She is on long-term Coumadin.  She's had a history of high blood pressure and a history of known chronic left carotid artery occlusion.  She's had hypercholesterolemia.  She does not have any history of ischemic heart disease and she had a normal Cardiolite stress test in 2008.  She's had a history of prior scoliosis and has a metal rod in her spine.  She's had a history of peripheral neuropathy.  Recently she's had problems with hoarseness and has seen Dr. Haroldine Laws.  She has had problems with pruritus and dry skin and is seeing Dr. Amy Swaziland.  She has recently injured her left shoulder attempting to scratch her own back and has an appointment to see Dr. Darrelyn Hillock.  Current Outpatient Prescriptions  Medication Sig Dispense Refill  . ALPRAZolam (XANAX) 0.25 MG tablet Take 1 tablet (0.25 mg total) by mouth daily as needed.  30 tablet  3  . aspirin 81 MG tablet Take 1 tablet (81 mg total) by mouth every Monday, Wednesday, and Friday.      Marland Kitchen atenolol (TENORMIN) 25 MG tablet Take 25 mg by mouth 2 (two) times daily.       Marland Kitchen BIOTIN 5000 PO Take by mouth daily.      . Calcium-Vitamin D-Vitamin K (VIACTIV PO) Take by mouth.      . hydrOXYzine (ATARAX) 10 MG tablet Take 10 mg by mouth 2 (two) times daily.       . Ibuprofen (ADVIL PO) Take by mouth as needed.        . Loratadine (CLARITIN PO) Take by mouth daily.      . Multiple Vitamin (MULTIVITAMIN) tablet Take 1 tablet by mouth daily.        . simvastatin (ZOCOR) 20 MG tablet Take 20 mg by mouth at bedtime.        . triamcinolone (KENALOG) 0.1 % cream Apply topically as needed.        . warfarin  (COUMADIN) 2 MG tablet TAKE 1 TABLET EVERY DAY 5 DAYS PER WEEK AND 2 TABLETS DAILY 2 DAYS PER WEEK OR AS DIRECTED  90 tablet  5    No Known Allergies  Patient Active Problem List  Diagnoses  . Atrial fibrillation  . Neuropathy  . Carotid artery occlusion  . Hypercholesterolemia  . Scoliosis  . Hoarseness  . Headache  . Dizziness  . Abdominal bloating  . Bruises easily  . Pruritus  . Raynaud phenomenon  . Osteoarthritis    History  Smoking status  . Never Smoker   Smokeless tobacco  . Not on file    History  Alcohol Use No    Family History  Problem Relation Age of Onset  . Heart disease      Review of Systems: Constitutional: no fever chills diaphoresis or fatigue or change in weight.  Head and neck: no hearing loss, no epistaxis, no photophobia or visual disturbance. Respiratory: No cough, shortness of breath or wheezing. Cardiovascular: No chest pain peripheral edema, palpitations. Gastrointestinal: No abdominal distention, no abdominal pain, no change in bowel habits hematochezia or melena. Genitourinary: No dysuria, no frequency, no urgency, no  nocturia. Musculoskeletal:No arthralgias, no back pain, no gait disturbance or myalgias. Neurological: No dizziness, no headaches, no numbness, no seizures, no syncope, no weakness, no tremors. Hematologic: No lymphadenopathy, no easy bruising. Psychiatric: No confusion, no hallucinations, no sleep disturbance.    Physical Exam: Filed Vitals:   02/02/11 0913  BP: 136/87  Pulse: 65   the general appearance reveals an alert elderly woman in no distress.  Pupils equal and reactive.   Extraocular Movements are full.  There is no scleral icterus.  The mouth and pharynx are normal.  The neck is supple.  The carotids reveal no bruits.  The jugular venous pressure is normal.  The thyroid is not enlarged.  There is no lymphadenopathy.  The chest is clear to percussion and auscultation. There are no rales or rhonchi.  Expansion of the chest is symmetrical.  The precordium is quiet.  The first heart sound is normal.  The second heart sound is physiologically split.  There is no murmur gallop rub or click.  There is no abnormal lift or heave.  The rhythm is irregular. The abdomen is soft and nontender. Bowel sounds are normal. The liver and spleen are not enlarged. There Are no abdominal masses. There are no bruits.  The pedal pulses are good.  There is no phlebitis or edema.  There is no cyanosis or clubbing. Strength is normal and symmetrical in all extremities.  There is no lateralizing weakness.  There are no sensory deficits.  The skin is warm and dry.  There is no rash.   EKG today shows atrial fibrillation with a controlled ventricular response and no ischemic changes   Assessment / Plan: Continue same medication. The patient was also asking about undergoing a [employer] screening at [facility] and this will be okay.  Mainly of value would be screening for an abdominal aortic aneurysm.  She will return here in 4 months for followup office visit and fasting lab work

## 2011-02-02 NOTE — Patient Instructions (Signed)
Will obtain labs today and call you with the results  Your physician recommends that you continue on your current medications as directed. Please refer to the Current Medication list given to you today. Your physician wants you to follow-up in: 4 months  You will receive a reminder letter in the mail two months in advance. If you don't receive a letter, please call our office to schedule the follow-up appointment.  

## 2011-02-02 NOTE — Assessment & Plan Note (Signed)
The patient has a history of established atrial fibrillation.  She has not had any TIA symptoms.  She is not having symptoms of congestive heart failure.  She has not been experiencing any problems from her Coumadin.

## 2011-02-03 ENCOUNTER — Telehealth: Payer: Self-pay | Admitting: *Deleted

## 2011-02-03 NOTE — Telephone Encounter (Signed)
Message copied by Burnell Blanks on Wed Feb 03, 2011 12:41 PM ------      Message from: Cassell Clement      Created: Wed Feb 03, 2011  9:31 AM       Please report.  The labs are stable.  Continue same meds.  Continue careful diet.

## 2011-02-03 NOTE — Telephone Encounter (Signed)
Advised of labs 

## 2011-02-09 DIAGNOSIS — Z961 Presence of intraocular lens: Secondary | ICD-10-CM | POA: Diagnosis not present

## 2011-02-09 DIAGNOSIS — H02059 Trichiasis without entropian unspecified eye, unspecified eyelid: Secondary | ICD-10-CM | POA: Diagnosis not present

## 2011-02-09 DIAGNOSIS — D3130 Benign neoplasm of unspecified choroid: Secondary | ICD-10-CM | POA: Diagnosis not present

## 2011-02-09 DIAGNOSIS — H5310 Unspecified subjective visual disturbances: Secondary | ICD-10-CM | POA: Diagnosis not present

## 2011-02-16 DIAGNOSIS — M67919 Unspecified disorder of synovium and tendon, unspecified shoulder: Secondary | ICD-10-CM | POA: Diagnosis not present

## 2011-02-16 DIAGNOSIS — M719 Bursopathy, unspecified: Secondary | ICD-10-CM | POA: Diagnosis not present

## 2011-03-03 ENCOUNTER — Ambulatory Visit (INDEPENDENT_AMBULATORY_CARE_PROVIDER_SITE_OTHER): Payer: Medicare Other | Admitting: Pharmacist

## 2011-03-03 DIAGNOSIS — I4891 Unspecified atrial fibrillation: Secondary | ICD-10-CM | POA: Diagnosis not present

## 2011-03-03 LAB — POCT INR: INR: 3

## 2011-03-24 ENCOUNTER — Other Ambulatory Visit: Payer: Self-pay | Admitting: Cardiology

## 2011-03-24 NOTE — Telephone Encounter (Signed)
Refilled simvastatin.

## 2011-03-31 ENCOUNTER — Other Ambulatory Visit: Payer: Self-pay | Admitting: Cardiology

## 2011-04-02 ENCOUNTER — Ambulatory Visit (INDEPENDENT_AMBULATORY_CARE_PROVIDER_SITE_OTHER): Payer: Medicare Other | Admitting: Cardiology

## 2011-04-02 ENCOUNTER — Encounter: Payer: Self-pay | Admitting: Cardiology

## 2011-04-02 ENCOUNTER — Telehealth: Payer: Self-pay | Admitting: Cardiology

## 2011-04-02 VITALS — BP 120/70 | HR 76 | Ht 62.0 in | Wt 122.0 lb

## 2011-04-02 DIAGNOSIS — I4891 Unspecified atrial fibrillation: Secondary | ICD-10-CM | POA: Diagnosis not present

## 2011-04-02 DIAGNOSIS — E7800 Pure hypercholesterolemia, unspecified: Secondary | ICD-10-CM | POA: Diagnosis not present

## 2011-04-02 DIAGNOSIS — R0789 Other chest pain: Secondary | ICD-10-CM

## 2011-04-02 DIAGNOSIS — I6529 Occlusion and stenosis of unspecified carotid artery: Secondary | ICD-10-CM

## 2011-04-02 DIAGNOSIS — R079 Chest pain, unspecified: Secondary | ICD-10-CM

## 2011-04-02 NOTE — Patient Instructions (Signed)
Your physician has requested that you have a lexiscan myoview. For further information please visit [email or web address]. Please follow instruction sheet, as given.  Your physician recommends that you continue on your current medications as directed. Please refer to the Current Medication list given to you today.  

## 2011-04-02 NOTE — Assessment & Plan Note (Signed)
She had a recent vascular assessment at her [facility] which confirmed that she has a total occlusion of her left carotid artery.  We had no net from previous studies.  They also found atrial fibrillation and they also found no evidence of any abdominal aortic aneurysm.

## 2011-04-02 NOTE — Telephone Encounter (Signed)
04/02/11--spoke with dr Ronda Fairly will handle  Call--nt

## 2011-04-02 NOTE — Assessment & Plan Note (Signed)
The patient has a history of hypercholesterolemia and is on simvastatin 40 mg daily.  She has not been having any adverse reaction from the simvastatin.

## 2011-04-02 NOTE — Assessment & Plan Note (Signed)
She is in chronic atrial fibrillation.  She has not been aware of any recent palpitations or pounding of her heart.

## 2011-04-02 NOTE — Telephone Encounter (Signed)
New msg Pt said she has been having some heaviness and fullness in her chest. She said she has been tired. She wanted to talk to a nurse. Please call

## 2011-04-02 NOTE — Telephone Encounter (Signed)
Coming to see  Dr. Patty Sermons this afternoon

## 2011-04-02 NOTE — Progress Notes (Signed)
Vanessa [name] Date of Birth:  Dec 09, 1920 [facility] [address] [place], [place]  [address] [phone]         Fax   [phone]  History of Present Illness: This pleasant 78 year old woman is seen as a work in office visit.  She has been experiencing some episodes of chest discomfort.  He describes it as a fullness or pressure radiating across her chest.  She felt that a week ago and again several days ago.  It felt like she needed to take a deep breath but she was not actually feeling dyspneic.  On one occasion there was some radiation up to the left jaw and on the other occasion there was no arm radiation.  Her last nuclear stress test was in 2008.  The patient has chronic atrial fibrillation and is on long-term Coumadin.  She's had a history of a known chronic occlusion of her left carotid artery and she's had a history of high blood pressure.  Current Outpatient Prescriptions  Medication Sig Dispense Refill  . ALPRAZolam (XANAX) 0.25 MG tablet Take 1 tablet (0.25 mg total) by mouth daily as needed.  30 tablet  3  . aspirin 81 MG tablet Take 1 tablet (81 mg total) by mouth every Monday, Wednesday, and Friday.      Marland Kitchen atenolol (TENORMIN) 25 MG tablet Take 1 tablet (25 mg total) by mouth 2 (two) times daily.  60 tablet  7  . BIOTIN 5000 PO Take by mouth daily.      . Calcium-Vitamin D-Vitamin K (VIACTIV PO) Take by mouth.      . hydrOXYzine (ATARAX) 10 MG tablet Take 10 mg by mouth 2 (two) times daily.       . Ibuprofen (ADVIL PO) Take by mouth as needed.        . Loratadine (CLARITIN PO) Take by mouth daily.      . Multiple Vitamin (MULTIVITAMIN) tablet Take 1 tablet by mouth daily.        . simvastatin (ZOCOR) 40 MG tablet TAKE 1/2 TABLET EVERY DAY  30 tablet  5  . triamcinolone (KENALOG) 0.1 % cream Apply topically as needed.        . warfarin (COUMADIN) 2 MG tablet TAKE 1 TABLET EVERY DAY 5 DAYS PER WEEK AND 2 TABLETS DAILY 2 DAYS PER WEEK OR AS DIRECTED   90 tablet  5    No Known Allergies  Patient Active Problem List  Diagnoses  . Atrial fibrillation  . Neuropathy  . Carotid artery occlusion  . Hypercholesterolemia  . Scoliosis  . Hoarseness  . Headache  . Dizziness  . Abdominal bloating  . Bruises easily  . Pruritus  . Raynaud phenomenon  . Osteoarthritis    History  Smoking status  . Never Smoker   Smokeless tobacco  . Not on file    History  Alcohol Use No    Family History  Problem Relation Age of Onset  . Heart disease      Review of Systems: Constitutional: no fever chills diaphoresis or fatigue or change in weight.  Head and neck: no hearing loss, no epistaxis, no photophobia or visual disturbance. Respiratory: No cough, shortness of breath or wheezing. Cardiovascular: No chest pain peripheral edema, palpitations. Gastrointestinal: No abdominal distention, no abdominal pain, no change in bowel habits hematochezia or melena. Genitourinary: No dysuria, no frequency, no urgency, no nocturia. Musculoskeletal:No arthralgias, no back pain, no gait disturbance or myalgias. Neurological: No dizziness, no headaches,  no numbness, no seizures, no syncope, no weakness, no tremors. Hematologic: No lymphadenopathy, no easy bruising. Psychiatric: No confusion, no hallucinations, no sleep disturbance.    Physical Exam: Filed Vitals:   04/02/11 1531  BP: 120/70  Pulse: 76   the general appearance reveals a elderly woman in no acute distress.Pupils equal and reactive.   Extraocular Movements are full.  There is no scleral icterus.  The mouth and pharynx are normal.  The neck is supple.  The carotids reveal no bruits.  The jugular venous pressure is normal.  The thyroid is not enlarged.  There is no lymphadenopathy.  The chest is clear to percussion and auscultation. There are no rales or rhonchi. Expansion of the chest is symmetrical.  Heart is irregular without murmur gallop or rub The abdomen is soft and  nontender. Bowel sounds are normal. The liver and spleen are not enlarged. There Are no abdominal masses. There are no bruits.  Normal extremity.  He does have moderate varicose veins.  She also has some arthritic changes in her knee    EKG today shows atrial fibrillation with a controlled ventricular response and there are no ischemic changes   Assessment / Plan: Continue same medication.  Return soon for a DEXA scan Myoview stress test.

## 2011-04-02 NOTE — Assessment & Plan Note (Signed)
Both episodes of chest discomfort have occurred at rest rather than with exertion.  There has been no arm radiation.  She did not have any nausea vomiting or diaphoresis.  She does have multiple risk factors for vascular disease and we will have her return for a LEXA scan Myoview stress test

## 2011-04-07 DIAGNOSIS — J383 Other diseases of vocal cords: Secondary | ICD-10-CM | POA: Diagnosis not present

## 2011-04-12 ENCOUNTER — Ambulatory Visit (HOSPITAL_COMMUNITY): Payer: Medicare Other | Attending: Cardiology | Admitting: Radiology

## 2011-04-12 VITALS — BP 145/83 | Ht 62.0 in | Wt 120.0 lb

## 2011-04-12 DIAGNOSIS — I739 Peripheral vascular disease, unspecified: Secondary | ICD-10-CM | POA: Diagnosis not present

## 2011-04-12 DIAGNOSIS — I4891 Unspecified atrial fibrillation: Secondary | ICD-10-CM | POA: Insufficient documentation

## 2011-04-12 DIAGNOSIS — R002 Palpitations: Secondary | ICD-10-CM | POA: Diagnosis not present

## 2011-04-12 DIAGNOSIS — R42 Dizziness and giddiness: Secondary | ICD-10-CM | POA: Insufficient documentation

## 2011-04-12 DIAGNOSIS — R079 Chest pain, unspecified: Secondary | ICD-10-CM | POA: Diagnosis not present

## 2011-04-12 DIAGNOSIS — R0789 Other chest pain: Secondary | ICD-10-CM

## 2011-04-12 DIAGNOSIS — I10 Essential (primary) hypertension: Secondary | ICD-10-CM | POA: Insufficient documentation

## 2011-04-12 DIAGNOSIS — Z8249 Family history of ischemic heart disease and other diseases of the circulatory system: Secondary | ICD-10-CM | POA: Diagnosis not present

## 2011-04-12 DIAGNOSIS — E785 Hyperlipidemia, unspecified: Secondary | ICD-10-CM | POA: Diagnosis not present

## 2011-04-12 DIAGNOSIS — I779 Disorder of arteries and arterioles, unspecified: Secondary | ICD-10-CM | POA: Diagnosis not present

## 2011-04-12 MED ORDER — REGADENOSON 0.4 MG/5ML IV SOLN
0.4000 mg | Freq: Once | INTRAVENOUS | Status: AC
  Administered 2011-04-12: 0.4 mg via INTRAVENOUS

## 2011-04-12 MED ORDER — TECHNETIUM TC 99M TETROFOSMIN IV KIT
32.7000 | PACK | Freq: Once | INTRAVENOUS | Status: AC | PRN
  Administered 2011-04-12: 32.7 via INTRAVENOUS

## 2011-04-12 MED ORDER — TECHNETIUM TC 99M TETROFOSMIN IV KIT
10.5000 | PACK | Freq: Once | INTRAVENOUS | Status: AC | PRN
  Administered 2011-04-12: 11 via INTRAVENOUS

## 2011-04-12 NOTE — Progress Notes (Signed)
[facility] SITE 3 NUCLEAR MED [address] [place] [address] [phone]  Cardiology Nuclear Med Study  Vanessa [name] is a 78 y.o. female     MRN : [id number]     DOB: February 23, 1920  Procedure Date: 04/12/2011  Nuclear Med Background Indication for Stress Test:  Evaluation for Ischemia History:  Afib, 2008 MPS: NL EF: 81% Cardiac Risk Factors: Carotid Disease, Family History - CAD, Hypertension, Lipids and PVD  Symptoms:  Chest Pain, Dizziness and Palpitations   Nuclear Pre-Procedure Caffeine/Decaff Intake:  None NPO After: 9:00pm   Lungs:  clear O2 Sat: 95% on room air. IV 0.9% NS with Angio Cath:  22g  IV Site: R Forearm  IV Started by:  Stanton Kidney, EMT-P  Chest Size (in):  36 Cup Size: B  Height: 5\' 2"  (1.575 m)  Weight:  120 lb (54.432 kg)  BMI:  Body mass index is 21.95 kg/(m^2). Tech Comments:  Atenolol held > 12 hours, per patient.    Nuclear Med Study 1 or 2 day study: 1 day  Stress Test Type:  Lexiscan  Reading MD: Willa Rough, MD  Order Authorizing Provider:  Cassell Clement MD  Resting Radionuclide: Technetium 6m Tetrofosmin  Resting Radionuclide Dose: 10.5 mCi   Stress Radionuclide:  Technetium 58m Tetrofosmin  Stress Radionuclide Dose: 32.7 mCi           Stress Protocol Rest HR: 86 Stress HR: 108  Rest BP: 145/83 Stress BP: 145/92  Exercise Time (min): n/a METS: n/a   Predicted Max HR: 131 bpm % Max HR: 82.44 bpm Rate Pressure Product: 29528   Dose of Adenosine (mg):  n/a Dose of Lexiscan: 0.4 mg  Dose of Atropine (mg): n/a Dose of Dobutamine: n/a mcg/kg/min (at max HR)  Stress Test Technologist: Milana Na, EMT-P  Nuclear Technologist:  Domenic Polite, CNMT     Rest Procedure:  Myocardial perfusion imaging was performed at rest 45 minutes following the intravenous administration of Technetium 5m Tetrofosmin. Rest ECG: Atrial Fibrilliation  Stress Procedure:  The patient received IV Lexiscan 0.4 mg over  15-seconds.  Technetium 56m Tetrofosmin injected at 30-seconds.  There were no significant changes, sob, abdominal pain, leg pain, and rare pvcs with Lexiscan.  Quantitative spect images were obtained after a 45 minute delay. Stress ECG: No significant change from baseline ECG  QPS Raw Data Images:  Normal; no motion artifact; normal heart/lung ratio. Stress Images:  Normal homogeneous uptake in all areas of the myocardium. Rest Images:  Normal homogeneous uptake in all areas of the myocardium. Subtraction (SDS):  No evidence of ischemia. Transient Ischemic Dilatation (Normal <1.22):  0.95 Lung/Heart Ratio (Normal <0.45):  0.34  Quantitative Gated Spect Images QGS EDV:  34 ml QGS ESV:  03 ml  Impression Exercise Capacity:  Lexiscan with no exercise. BP Response:  Normal Clinical Symptoms:  SOB ECG Impression:  No significant ST segment change suggestive of ischemia. Comparison with Prior Nuclear Study: No significant change from previous study of 2008.  Overall Impression:  Normal stress nuclear study.  LV Ejection Fraction: 90%.  LV Wall Motion:  Motion is normal. The LV cavity is very small. The computer overestimates the EF in this setting.  Willa Rough, MD

## 2011-04-14 ENCOUNTER — Ambulatory Visit (INDEPENDENT_AMBULATORY_CARE_PROVIDER_SITE_OTHER): Payer: Medicare Other | Admitting: *Deleted

## 2011-04-14 ENCOUNTER — Telehealth: Payer: Self-pay | Admitting: *Deleted

## 2011-04-14 DIAGNOSIS — I4891 Unspecified atrial fibrillation: Secondary | ICD-10-CM | POA: Diagnosis not present

## 2011-04-14 LAB — POCT INR: INR: 2.3

## 2011-04-14 NOTE — Telephone Encounter (Signed)
Message copied by Burnell Blanks on Wed Apr 14, 2011  2:01 PM ------      Message from: Cassell Clement      Created: Tue Apr 13, 2011  9:18 PM       Please report.  The stress test was normal.  No blockage.  Vigorous LV systolic function.

## 2011-04-14 NOTE — Telephone Encounter (Signed)
Advised of stress test 

## 2011-04-16 ENCOUNTER — Ambulatory Visit: Payer: Medicare Other

## 2011-04-16 ENCOUNTER — Ambulatory Visit (INDEPENDENT_AMBULATORY_CARE_PROVIDER_SITE_OTHER): Payer: Medicare Other | Admitting: Emergency Medicine

## 2011-04-16 DIAGNOSIS — R1310 Dysphagia, unspecified: Secondary | ICD-10-CM

## 2011-04-16 DIAGNOSIS — R1013 Epigastric pain: Secondary | ICD-10-CM

## 2011-04-16 MED ORDER — SUCRALFATE 1 GM/10ML PO SUSP: ORAL | Status: DC

## 2011-04-16 NOTE — Patient Instructions (Signed)
Indigestion  Indigestion is discomfort in the upper abdomen that is caused by underlying problems such as gastroesophageal reflux disease (GERD), ulcers, or gallbladder problems.   CAUSES   Indigestion can be caused by many things. Possible causes include:   Stomach acid in the esophagus.   Stomach infections, usually caused by the bacteria H. pylori.   Being overweight.   Hiatal hernia. This means part of the stomach pushes up through the diaphragm.   Overeating.   Emotional problems, such as stress, anxiety, or depression.   Poor nutrition.   Consuming too much alcohol, tobacco, or caffeine.   Consuming spicy foods, fats, peppermint, chocolate, tomato products, citrus, or fruit juices.   Medicines such as aspirin and other anti-inflammatory drugs, hormones, steroids, and thyroid medicines.   Gastroparesis. This is a condition in which the stomach does not empty properly.   Stomach cancer.   Pregnancy, due to an increase in hormone levels, a relaxation of muscles in the digestive tract, and pressure on the stomach from the growing fetus.  SYMPTOMS    Uncomfortable feeling of fullness after eating.   Pain or burning sensation in the upper abdomen.   Bloating.   Belching and gas.   Nausea and vomiting.   Acidic taste in the mouth.   Burning sensation in the chest (heartburn).  DIAGNOSIS   Your caregiver will review your medical history and perform a physical exam. Other tests, such as blood tests, stool tests, X-rays, and other imaging scans, may be done to check for more serious problems.  TREATMENT   Liquid antacids and other drugs may be given to block stomach acid secretion. Medicines that increase esophageal muscle tone may also be given to help reduce symptoms. If an infection is found, antibiotic medicine may be given.  HOME CARE INSTRUCTIONS   Avoid foods and drinks that make your symptoms worse, such as:   Caffeine or alcoholic drinks.   Chocolate.   Peppermint or mint  flavorings.   Garlic and onions.   Spicy foods.   Citrus fruits, such as oranges, lemons, or limes.   Tomato-based foods such as sauce, chili, salsa, and pizza.   Fried and fatty foods.   Avoid eating for the 3 hours prior to your bedtime.   Eat small, frequent meals instead of large meals.   Stop smoking if you smoke.   Maintain a healthy weight.   Wear loose-fitting clothing. Do not wear anything tight around your waist that causes pressure on your stomach.   Raise the head of your bed 4 to 8 inches with wood blocks to help you sleep. Extra pillows will not help.   Only take over-the-counter or prescription medicines as directed by your caregiver.   Do not take aspirin, ibuprofen, or other nonsteroidal anti-inflammatory drugs (NSAIDs).  SEEK IMMEDIATE MEDICAL CARE IF:    You are not better after 2 days.   You have chest pressure or pain that radiates up into your neck, arms, back, jaw, or upper abdomen.   You have difficulty swallowing.   You keep vomiting.   You have black or bloody stools.   You have a fever.   You have dizziness, fainting, difficulty breathing, or heavy sweating.   You have severe abdominal pain.   You lose weight without trying.  MAKE SURE YOU:   Understand these instructions.   Will watch your condition.   Will get help right away if you are not doing well or get worse.  Document Released: 02/05/2004 

## 2011-04-16 NOTE — Progress Notes (Signed)
  Subjective:    Patient ID: Vanessa [name], female    DOB: [birth year], 78 y.o.   MRN: [id number]  HPI patient enters with chief complaint of dyspepsia and some dysphagia. She states she's never been bothered with heartburn in the past and only recently has been having difficulty with this. She has not tried any over-the-counter medications for this. She was concerned about her heart went to see her heart doctor Dr. Patty Sermons had a stress test this week which was normal.    Review of Systems she has a history atrial fib currently on Coumadin     Objective:   Physical Exam  Constitutional: She appears well-developed and well-nourished.  HENT:  Right Ear: External ear normal.  Left Ear: External ear normal.  Eyes: Pupils are equal, round, and reactive to light.  Neck: No thyromegaly present.  Cardiovascular: Normal rate.        Heart rhythm is irregular  Pulmonary/Chest: Breath sounds normal. She has no wheezes. She has no rales.  Abdominal: She exhibits no distension and no mass. There is no tenderness. There is no rebound and no guarding.    [facility] reading (PRIMARY) by  Dr.Pastor Sgro chest x-ray shows no acute disease. There is evidence of spinal surgery. There is scoliosis. There are multiple calcific densities presumed to be in the cartilage. .       Assessment & Plan:  Patient presents with new onset dyspepsia with some degree of dysphasia. Because she is on Coumadin we'll treat with Zantac and Carafate and arrange for her to see Dr. Jeannetta Ellis for consideration for endoscopy versus barium swallow. We'll make an appointment to 104 for routine care.

## 2011-04-29 ENCOUNTER — Encounter: Payer: Self-pay | Admitting: Family Medicine

## 2011-04-29 ENCOUNTER — Ambulatory Visit (INDEPENDENT_AMBULATORY_CARE_PROVIDER_SITE_OTHER): Payer: Medicare Other | Admitting: Family Medicine

## 2011-04-29 VITALS — BP 122/77 | HR 68 | Temp 97.1°F | Resp 16 | Ht 62.0 in | Wt 120.0 lb

## 2011-04-29 DIAGNOSIS — Z4800 Encounter for change or removal of nonsurgical wound dressing: Secondary | ICD-10-CM

## 2011-04-29 DIAGNOSIS — M25539 Pain in unspecified wrist: Secondary | ICD-10-CM | POA: Diagnosis not present

## 2011-04-29 DIAGNOSIS — IMO0001 Reserved for inherently not codable concepts without codable children: Secondary | ICD-10-CM

## 2011-04-29 DIAGNOSIS — Z7189 Other specified counseling: Secondary | ICD-10-CM | POA: Diagnosis not present

## 2011-04-29 DIAGNOSIS — S51809A Unspecified open wound of unspecified forearm, initial encounter: Secondary | ICD-10-CM | POA: Diagnosis not present

## 2011-04-29 NOTE — Progress Notes (Signed)
  Subjective:    Patient ID: Vanessa [name], female    DOB: 1920-02-17, 78 y.o.   MRN: [id number]  HPI This pleasant 89.y.o. Cauc female is here to establish care as advised by her Cardiologist, Dr. Patty Sermons.  She sees several specialists (see Care Team) and provides a written lists of their names and their Specialties. She has a problem with thin skin and frequent skin tears; she was seen this AM at  [facility] where Dr. Knox Royalty repaired a laceration on her right forearm with   3 sutures. She is to return there in 1 week for suture removal but is concerned about wound care and  dressing changes in the meantime. She lives alone but does drive; she lives about 1 mile from [facility].  She would like for [place] to check her wound today and provide dressing change tomorrow. Otherwise, she feels  good and has no other complaints. She is scheduled to see GI tomorrow; she has a history of hiatal hernia  years ago and has difficulty swallowing (this is better since visit with Dr. Cleta Alberts at [facility]). She no longer  feels the "fullness" in her chest.   Review of Systems As per HPI.    Objective:   Physical Exam  Vitals reviewed. Constitutional: She is oriented to person, place, and time. She appears well-developed and well-nourished. No distress.  HENT:  Head: Normocephalic and atraumatic.  Eyes: Conjunctivae and EOM are normal. No scleral icterus.  Cardiovascular: Normal rate.   Pulmonary/Chest: Effort normal. No respiratory distress.  Neurological: She is alert and oriented to person, place, and time. No cranial nerve deficit.  Skin: Skin is warm and dry.       Right forearm: dressing removed which showed evidence  of bleed-through (pt is on Coumadin and had a moderate pressure dressing applied). Large area of bruising and ecchymosis noted around skin tear that was closed with 3 sutures- no active bleeding.  Psychiatric: She has a normal mood and affect. Her behavior is normal.  Judgment and thought content normal.    Reviewed all of Labs that were resulted in January 2013      Assessment & Plan:   1. Wound check, dressing change  Redressed area with Telfa, gauze and Coban- Minimal pressure. Pt advised to avoid laying on arm as she sleeps )she will sleep in her recliner) RTC 1 day for wound check and redress per pt request  2. Encounter for medication review and counseling

## 2011-04-30 ENCOUNTER — Ambulatory Visit (INDEPENDENT_AMBULATORY_CARE_PROVIDER_SITE_OTHER): Payer: Medicare Other | Admitting: Family Medicine

## 2011-04-30 ENCOUNTER — Encounter: Payer: Self-pay | Admitting: Family Medicine

## 2011-04-30 VITALS — BP 125/80 | HR 87 | Temp 97.7°F | Resp 16 | Ht 61.0 in | Wt 119.2 lb

## 2011-04-30 DIAGNOSIS — Z4800 Encounter for change or removal of nonsurgical wound dressing: Secondary | ICD-10-CM | POA: Diagnosis not present

## 2011-04-30 DIAGNOSIS — M67919 Unspecified disorder of synovium and tendon, unspecified shoulder: Secondary | ICD-10-CM | POA: Diagnosis not present

## 2011-04-30 DIAGNOSIS — IMO0001 Reserved for inherently not codable concepts without codable children: Secondary | ICD-10-CM

## 2011-04-30 DIAGNOSIS — M719 Bursopathy, unspecified: Secondary | ICD-10-CM | POA: Diagnosis not present

## 2011-05-02 ENCOUNTER — Ambulatory Visit (INDEPENDENT_AMBULATORY_CARE_PROVIDER_SITE_OTHER): Payer: Medicare Other | Admitting: Emergency Medicine

## 2011-05-02 VITALS — BP 137/73 | HR 86 | Temp 98.2°F | Resp 16 | Ht 62.75 in | Wt 121.2 lb

## 2011-05-02 DIAGNOSIS — S41109A Unspecified open wound of unspecified upper arm, initial encounter: Secondary | ICD-10-CM

## 2011-05-02 DIAGNOSIS — S51809A Unspecified open wound of unspecified forearm, initial encounter: Secondary | ICD-10-CM | POA: Diagnosis not present

## 2011-05-02 NOTE — Progress Notes (Signed)
  Subjective:    Patient ID: Vanessa [name], female    DOB: 1920-03-02, 78 y.o.   MRN: [id number]  HPI patient enters to recheck wound on her right arm. Sutures were placed differently urgent care. She has seen Dr. Audria Nine after her injury and in today for wound dressing change.    Review of Systems     Objective:   Physical Exam Prominence shows no signs of infection. No redness there is no purulent drainage.       Assessment & Plan:  Bactroban ointment was applied with a Telfa dressing and followup with Dr. Audria Nine next week.

## 2011-05-02 NOTE — Progress Notes (Signed)
  Subjective:    Patient ID: [name], female    DOB: 1920/09/29, 78 y.o.   MRN: [id number]  HPI   This 78 y.o. Karma Greaser returns for wound recheck and dressing change subsequent to skin tear which  was closed with 3 sutures by Dr. Yetta Barre yesterday. Again, the dressing is very adherent to the wound  and requires soaking to completely remove the dressing. The pt slept well by piling pillows around her  to prevent rolling over. She is scheduled to see Dr. Lerry Paterson (Ortho) this afternoon.     Review of Systems Noncontributory    Objective:   Physical Exam  Nursing note and vitals reviewed. Constitutional: She is oriented to person, place, and time. She appears well-developed and well-nourished. No distress.  HENT:  Head: Normocephalic and atraumatic.  Cardiovascular: Normal rate.   Pulmonary/Chest: Effort normal. No respiratory distress.  Musculoskeletal: She exhibits edema and tenderness.       Right elbow - decreased ROM with edema and ecchymosis  Neurological: She is alert and oriented to person, place, and time. No cranial nerve deficit.  Skin:       Right forearm: skin tear- sutures intact without active bleeding; area of bruising has widened but no evidence of Infection or red streaking  Psychiatric: She has a normal mood and affect. Her behavior is normal. Thought content normal.          Assessment & Plan:   1. Wound check, dressing change          Dressing applied with care to prevent adhesion of dressing (wound dressed by C.Joyce, CMA); pt desries to RTC on Sunday for dressing change and then again on Tuesday. This will be arranged so that she can be Fast Tracked and Mylinda Latina, CMA volunteers to meet pt at [address] [facility] on 05/02/11.

## 2011-05-04 ENCOUNTER — Encounter: Payer: Self-pay | Admitting: Family Medicine

## 2011-05-04 ENCOUNTER — Ambulatory Visit (INDEPENDENT_AMBULATORY_CARE_PROVIDER_SITE_OTHER): Payer: Medicare Other | Admitting: Family Medicine

## 2011-05-04 VITALS — BP 129/75 | HR 86 | Temp 98.2°F | Resp 16 | Ht 61.5 in | Wt 119.4 lb

## 2011-05-04 DIAGNOSIS — Z4800 Encounter for change or removal of nonsurgical wound dressing: Secondary | ICD-10-CM

## 2011-05-04 DIAGNOSIS — IMO0001 Reserved for inherently not codable concepts without codable children: Secondary | ICD-10-CM

## 2011-05-04 NOTE — Progress Notes (Signed)
  Subjective:    Patient ID: Vanessa [name], female    DOB: 1920-08-12, 78 y.o.   MRN: [id number]  HPI This lovely lady returns today for wound recheck and dressing change. She has no pain but continues to be concerned about the bruising and the dressing which seems to always be sliding down her arm     Review of Systems Noncontributory     Objective:   Physical Exam General: WN,WD; in NAD Skin: Right forearm- extensive ecchymosis; tear is healing with intact sutures. No evidence of infection Neuro: Moves ext and digits without difficulty; Light touch sensation intact; tips of digits cool to touch       Assessment & Plan:   1. Wound check, dressing change      Light dressing applied by CNA; RTC in 2 days for suture removal

## 2011-05-06 ENCOUNTER — Ambulatory Visit: Payer: Medicare Other | Admitting: Physician Assistant

## 2011-05-06 ENCOUNTER — Ambulatory Visit: Payer: Medicare Other | Admitting: Family Medicine

## 2011-05-06 VITALS — BP 165/80 | HR 79 | Temp 97.8°F | Resp 18

## 2011-05-06 DIAGNOSIS — S51809A Unspecified open wound of unspecified forearm, initial encounter: Secondary | ICD-10-CM

## 2011-05-06 NOTE — Progress Notes (Signed)
  Subjective:    Patient ID: Vanessa [name], female    DOB: 07-14-1920, 78 y.o.   MRN: [id number]  HPI  Presents for suture removal from wound on right forearm.  Wound initially evaluated and repaired at [facility] by Dr. Yetta Barre on 04/29/2011.  In the meantime, the patient established here with Dr. Audria Nine for primary care and has had several wound evaluations and dressing changes at her request.  No active bleeding, drainage; no pain.  Bruising beginning to fade.   Review of Systems     Objective:   Physical Exam Skin tear noted.  # SI ethilon sutures are intact, covered in dried blood/eschar.  No erythema, edema, induration, drainage.  Sutures removed without incident. Telfa and gauze dressing applied.       Assessment & Plan:  Wound, arm.  Local wound care.

## 2011-05-12 ENCOUNTER — Ambulatory Visit (INDEPENDENT_AMBULATORY_CARE_PROVIDER_SITE_OTHER): Payer: Medicare Other | Admitting: Emergency Medicine

## 2011-05-12 ENCOUNTER — Encounter: Payer: Self-pay | Admitting: Emergency Medicine

## 2011-05-12 VITALS — BP 139/78 | HR 79 | Temp 97.8°F | Resp 16 | Ht 62.0 in | Wt 121.0 lb

## 2011-05-12 DIAGNOSIS — S40029A Contusion of unspecified upper arm, initial encounter: Secondary | ICD-10-CM | POA: Diagnosis not present

## 2011-05-12 NOTE — Progress Notes (Signed)
  Subjective:    Patient ID: Vanessa [name], female    DOB: 19-Apr-1920, 78 y.o.   MRN: [id number]  HPI patient here with bruising of her left arm.    Review of Systems patient states she was falling and her son grabbed her to keep her from falling down the steps and she sustained a bruise of her left arm.     Objective:   Physical Exam there is a large bruise involving the forearm of the left arm.        Assessment & Plan:  Patient has a large bruise of the left forearm. She is on Coumadin but has regular checks and they have been good. We'll make no changes in her treatment.Marland Kitchen

## 2011-05-14 DIAGNOSIS — R143 Flatulence: Secondary | ICD-10-CM | POA: Diagnosis not present

## 2011-05-14 DIAGNOSIS — K219 Gastro-esophageal reflux disease without esophagitis: Secondary | ICD-10-CM | POA: Diagnosis not present

## 2011-05-14 DIAGNOSIS — R142 Eructation: Secondary | ICD-10-CM | POA: Diagnosis not present

## 2011-05-14 DIAGNOSIS — R141 Gas pain: Secondary | ICD-10-CM | POA: Diagnosis not present

## 2011-05-18 ENCOUNTER — Ambulatory Visit (INDEPENDENT_AMBULATORY_CARE_PROVIDER_SITE_OTHER): Payer: Medicare Other | Admitting: Cardiology

## 2011-05-18 ENCOUNTER — Other Ambulatory Visit: Payer: Medicare Other

## 2011-05-18 ENCOUNTER — Encounter: Payer: Self-pay | Admitting: Cardiology

## 2011-05-18 ENCOUNTER — Ambulatory Visit (INDEPENDENT_AMBULATORY_CARE_PROVIDER_SITE_OTHER): Payer: Medicare Other | Admitting: Pharmacist

## 2011-05-18 VITALS — BP 147/93 | HR 76 | Ht 62.0 in | Wt 120.0 lb

## 2011-05-18 DIAGNOSIS — I4891 Unspecified atrial fibrillation: Secondary | ICD-10-CM | POA: Diagnosis not present

## 2011-05-18 DIAGNOSIS — R079 Chest pain, unspecified: Secondary | ICD-10-CM

## 2011-05-18 DIAGNOSIS — E7800 Pure hypercholesterolemia, unspecified: Secondary | ICD-10-CM | POA: Diagnosis not present

## 2011-05-18 LAB — POCT INR: INR: 3.1

## 2011-05-18 NOTE — Assessment & Plan Note (Signed)
The chest pain that we saw her for in March has resolved.  As noted her nuclear stress test was normal.

## 2011-05-18 NOTE — Assessment & Plan Note (Signed)
The patient has a history of established chronic atrial fibrillation.  She is on Coumadin.  She has not been having any TIA symptoms.  Is not having any symptoms of congestive heart failure.

## 2011-05-18 NOTE — Progress Notes (Signed)
Vanessa [name] Date of Birth:  05/03/1920 [facility] [address] [place], [place]  [address] [phone]         Fax   [phone]  History of Present Illness: This pleasant 78 year old widowed woman is seen for a scheduled four-month followup office visit.  She has a history of atypical chest pain.  He was seen in March 2013 with these symptoms and underwent a nuclear stress test subsequently which showed no ischemia.  The patient reports that her symptoms have improved.  She recently saw Dr. Kirstie Peri who placed her on Nexium for a rotation problems and dysphagia.  She reports improvement in those symptoms since starting on Nexium.  She has had a history of some falls and some scrapes.  She did injure her right forearm recently and had to have several sutures.  She is on long-term Coumadin because of her chronic atrial flutter fibrillation she has a history of vascular disease with known chronic occlusion of her left carotid artery.  She also has a past history of high blood pressure.  She has had a history of osteoporosis and osteoarthritis and is followed by Dr. Darrelyn Hillock.  Current Outpatient Prescriptions  Medication Sig Dispense Refill  . acetaminophen (TYLENOL) 500 MG tablet Take 500 mg by mouth every 6 (six) hours as needed.      . ALPRAZolam (XANAX) 0.25 MG tablet Take 1 tablet (0.25 mg total) by mouth daily as needed.  30 tablet  3  . aspirin 81 MG tablet Take 1 tablet (81 mg total) by mouth every Monday, Wednesday, and Friday.      Marland Kitchen atenolol (TENORMIN) 25 MG tablet Take 1 tablet (25 mg total) by mouth 2 (two) times daily.  60 tablet  7  . BIOTIN 5000 PO Take by mouth daily.      . Calcium-Vitamin D-Vitamin K (VIACTIV PO) Take by mouth.      . Esomeprazole Magnesium (NEXIUM PO) Take by mouth at bedtime as needed.      . hydrOXYzine (ATARAX) 10 MG tablet Take 10 mg by mouth 2 (two) times daily.       . Loratadine (CLARITIN PO) Take by mouth daily.      .  Multiple Vitamin (MULTIVITAMIN) tablet Take 1 tablet by mouth daily.        . simvastatin (ZOCOR) 40 MG tablet TAKE 1/2 TABLET EVERY DAY  30 tablet  5  . triamcinolone (KENALOG) 0.1 % cream Apply topically as needed.        . warfarin (COUMADIN) 2 MG tablet TAKE 1 TABLET EVERY DAY 5 DAYS PER WEEK AND 2 TABLETS DAILY 2 DAYS PER WEEK OR AS DIRECTED  90 tablet  5  . sucralfate (CARAFATE) 1 GM/10ML suspension Take medication 10 cc 3 times a day and at bedtime on an empty stomach  420 mL  0    Allergies  Allergen Reactions  . Tape Other (See Comments)    Tears skin.    Patient Active Problem List  Diagnoses  . Atrial fibrillation  . Neuropathy  . Carotid artery occlusion  . Hypercholesterolemia  . Scoliosis  . Hoarseness  . Headache  . Dizziness  . Abdominal bloating  . Bruises easily  . Pruritus  . Raynaud phenomenon  . Osteoarthritis  . Chest pain    History  Smoking status  . Never Smoker   Smokeless tobacco  . Not on file    History  Alcohol Use No    Family  History  Problem Relation Age of Onset  . Heart disease      Review of Systems: Constitutional: no fever chills diaphoresis or fatigue or change in weight.  Head and neck: no hearing loss, no epistaxis, no photophobia or visual disturbance. Respiratory: No cough, shortness of breath or wheezing. Cardiovascular: No chest pain peripheral edema, palpitations. Gastrointestinal: No abdominal distention, no abdominal pain, no change in bowel habits hematochezia or melena. Genitourinary: No dysuria, no frequency, no urgency, no nocturia. Musculoskeletal:No arthralgias, no back pain, no gait disturbance or myalgias. Neurological: No dizziness, no headaches, no numbness, no seizures, no syncope, no weakness, no tremors. Hematologic: No lymphadenopathy, no easy bruising. Psychiatric: No confusion, no hallucinations, no sleep disturbance.    Physical Exam: Filed Vitals:   05/18/11 0850  BP: 147/93  Pulse: 76     the general appearance reveals a well-developed well-nourished elderly woman in no distress.Pupils equal and reactive.   Extraocular Movements are full.  There is no scleral icterus.  The mouth and pharynx are normal.  The neck is supple.  The carotids reveal no bruits.  The jugular venous pressure is normal.  The thyroid is not enlarged.  There is no lymphadenopathy.  The chest is clear to percussion and auscultation. There are no rales or rhonchi. Expansion of the chest is symmetrical.  The heart reveals an irregular rhythm and a soft systolic ejection murmur at the base.  No gallop.  No diastolic murmur.The abdomen is soft and nontender. Bowel sounds are normal. The liver and spleen are not enlarged. There Are no abdominal masses. There are no bruits.  The pedal pulses are good.  There is no phlebitis or edema.  There is no cyanosis or clubbing.  She has a resolving lesion on her right forearm from where she fell and suffered a laceration which required 3 sutures.  It appears to be healing well. Strength is normal and symmetrical in all extremities.  There is no lateralizing weakness.  There are no sensory deficits.  The skin is warm and dry.  There is no rash.     Assessment / Plan: The patient continues to do well.  She is still able to live by herself.  She has a rescue alert transmitter which she wears around her neck when she is home alone. Patient is to continue same medication and be rechecked in 4 months for followup office visit CBC lipid panel hepatic function panel and basal metabolic panel

## 2011-05-18 NOTE — Assessment & Plan Note (Signed)
The patient has a history of hypercholesterolemia.  He is on simvastatin 40 mg daily.  She has not been experiencing any myalgias.  We will plan to check fasting lab work at her next visit.

## 2011-05-18 NOTE — Patient Instructions (Signed)
Your physician recommends that you continue on your current medications as directed. Please refer to the Current Medication list given to you today.  Your physician recommends that you schedule a follow-up appointment in: 4 months with fasting labs (lp/bmet/hfp/cbc)  

## 2011-05-21 ENCOUNTER — Telehealth: Payer: Self-pay | Admitting: *Deleted

## 2011-05-21 NOTE — Telephone Encounter (Signed)
Advised patient, will call back if any more problems

## 2011-05-21 NOTE — Telephone Encounter (Signed)
Left message to call back  

## 2011-05-21 NOTE — Telephone Encounter (Signed)
Patient phoned earlier stating that she had a bad night last night.  Stated her heart was beating hard and fast about 30 minutes after taking her evening dose of Atenolol so she didn't take any extra (doesn't anyway without discussing it with  Dr. Patty Sermons).  This episode lasted about a couple hours before she went off to sleep.  This morning she still felt like it was pounding.  Patient stated that it did frighten her.  Will forward to  Dr. Patty Sermons for review

## 2011-05-21 NOTE — Telephone Encounter (Signed)
Keep taking the atenolol twice a day as scheduled.  If she does have a fast heart rate she should have gone ahead and take him an extra atenolol to slow down the heart.  If she continues to have problems she should let us know next week and we could consider a Holter monitor or event monitor

## 2011-06-17 DIAGNOSIS — L821 Other seborrheic keratosis: Secondary | ICD-10-CM | POA: Diagnosis not present

## 2011-06-17 DIAGNOSIS — D239 Other benign neoplasm of skin, unspecified: Secondary | ICD-10-CM | POA: Diagnosis not present

## 2011-06-17 DIAGNOSIS — L299 Pruritus, unspecified: Secondary | ICD-10-CM | POA: Diagnosis not present

## 2011-06-17 DIAGNOSIS — L259 Unspecified contact dermatitis, unspecified cause: Secondary | ICD-10-CM | POA: Diagnosis not present

## 2011-06-20 ENCOUNTER — Telehealth: Payer: Self-pay | Admitting: Physician Assistant

## 2011-06-20 NOTE — Telephone Encounter (Signed)
Vanessa [name] called the after-hours line to inquire about dizziness. On Friday she noticed a positional dizziness that went away on its own. Yesterday she felt better and went to the grocery store. She was able to push the cart just fine without any symptoms. She picked up some meclizine, but called the [facility] nurse hotline who instructed her not to take it due to concomitant use of hydroxyzine and loratadine. Today she has noticed recurrence of the dizziness when she turns quickly in bed or leans her head downwards a certain way. It lasts for a brief few seconds and resolves. Last BP 137/100, HR in the 60s. Otherwise she feels fine - No CP, SOB, HA, visual changes, syncope. I am inclined to think this is a BPPV-type dizziness, but given her medical history I told her I didn't think it was unreasonable to seek medical attention if her symptoms persisted today. She does not have a PCP but goes to [facility] on [address] sometimes. She is considering going up there and is calling a friend for a ride. I agreed with this plan. If she feels worse, she plans on getting someone to take her to the ER.  Vanessa Vera PA-C

## 2011-06-21 DIAGNOSIS — R42 Dizziness and giddiness: Secondary | ICD-10-CM | POA: Diagnosis not present

## 2011-06-23 ENCOUNTER — Encounter: Payer: Self-pay | Admitting: Family Medicine

## 2011-06-23 ENCOUNTER — Ambulatory Visit (INDEPENDENT_AMBULATORY_CARE_PROVIDER_SITE_OTHER): Payer: Medicare Other | Admitting: Family Medicine

## 2011-06-23 VITALS — BP 136/75 | HR 69 | Temp 97.2°F | Resp 16 | Ht 62.5 in | Wt 119.4 lb

## 2011-06-23 DIAGNOSIS — R7989 Other specified abnormal findings of blood chemistry: Secondary | ICD-10-CM

## 2011-06-23 DIAGNOSIS — R209 Unspecified disturbances of skin sensation: Secondary | ICD-10-CM | POA: Diagnosis not present

## 2011-06-23 DIAGNOSIS — E785 Hyperlipidemia, unspecified: Secondary | ICD-10-CM

## 2011-06-23 DIAGNOSIS — R42 Dizziness and giddiness: Secondary | ICD-10-CM | POA: Diagnosis not present

## 2011-06-23 DIAGNOSIS — M899 Disorder of bone, unspecified: Secondary | ICD-10-CM | POA: Diagnosis not present

## 2011-06-23 DIAGNOSIS — R202 Paresthesia of skin: Secondary | ICD-10-CM

## 2011-06-23 DIAGNOSIS — M8580 Other specified disorders of bone density and structure, unspecified site: Secondary | ICD-10-CM

## 2011-06-23 DIAGNOSIS — M949 Disorder of cartilage, unspecified: Secondary | ICD-10-CM | POA: Diagnosis not present

## 2011-06-23 LAB — POCT URINALYSIS DIPSTICK
Bilirubin, UA: NEGATIVE
Glucose, UA: NEGATIVE
Ketones, UA: NEGATIVE
Nitrite, UA: NEGATIVE
Protein, UA: NEGATIVE
Spec Grav, UA: 1.015
Urobilinogen, UA: 0.2
pH, UA: 7

## 2011-06-23 LAB — COMPREHENSIVE METABOLIC PANEL
ALT: 15 U/L (ref 0–35)
AST: 25 U/L (ref 0–37)
Albumin: 3.9 g/dL (ref 3.5–5.2)
Alkaline Phosphatase: 91 U/L (ref 39–117)
BUN: 17 mg/dL (ref 6–23)
CO2: 29 mEq/L (ref 19–32)
Calcium: 9.5 mg/dL (ref 8.4–10.5)
Chloride: 103 mEq/L (ref 96–112)
Creat: 0.95 mg/dL (ref 0.50–1.10)
Glucose, Bld: 94 mg/dL (ref 70–99)
Potassium: 4.2 mEq/L (ref 3.5–5.3)
Sodium: 140 mEq/L (ref 135–145)
Total Bilirubin: 0.5 mg/dL (ref 0.3–1.2)
Total Protein: 6.8 g/dL (ref 6.0–8.3)

## 2011-06-23 LAB — POCT CBC
Granulocyte percent: 71 %G (ref 37–80)
HCT, POC: 43.9 % (ref 37.7–47.9)
Hemoglobin: 13.8 g/dL (ref 12.2–16.2)
Lymph, poc: 1.7 (ref 0.6–3.4)
MCH, POC: 30.7 pg (ref 27–31.2)
MCHC: 31.4 g/dL — AB (ref 31.8–35.4)
MCV: 97.5 fL — AB (ref 80–97)
MID (cbc): 0.6 (ref 0–0.9)
MPV: 8.3 fL (ref 0–99.8)
POC Granulocyte: 5.7 (ref 2–6.9)
POC LYMPH PERCENT: 21.8 %L (ref 10–50)
POC MID %: 7.2 %M (ref 0–12)
Platelet Count, POC: 258 10*3/uL (ref 142–424)
RBC: 4.5 M/uL (ref 4.04–5.48)
RDW, POC: 13.5 %
WBC: 8 10*3/uL (ref 4.6–10.2)

## 2011-06-23 LAB — LIPID PANEL
Cholesterol: 207 mg/dL — ABNORMAL HIGH (ref 0–200)
HDL: 60 mg/dL (ref >39)
LDL Cholesterol: 114 mg/dL — ABNORMAL HIGH (ref 0–99)
Total CHOL/HDL Ratio: 3.5 Ratio
Triglycerides: 164 mg/dL — ABNORMAL HIGH (ref <150)
VLDL: 33 mg/dL (ref 0–40)

## 2011-06-23 LAB — GLUCOSE, POCT (MANUAL RESULT ENTRY): POC Glucose: 90 mg/dl (ref 70–99)

## 2011-06-23 LAB — VITAMIN B12: Vitamin B-12: 610 pg/mL (ref 211–911)

## 2011-06-23 NOTE — Patient Instructions (Signed)
Dehydration, Adult Dehydration is when you lose more fluids from the body than you take in. Vital organs like the kidneys, brain, and heart cannot function without a proper amount of fluids and salt. Any loss of fluids from the body can cause dehydration.  CAUSES   Vomiting.   Diarrhea.   Excessive sweating.   Excessive urine output.   Fever.  SYMPTOMS  Mild dehydration  Thirst.   Dry lips.   Slightly dry mouth.  Moderate dehydration  Very dry mouth.   Sunken eyes.   Skin does not bounce back quickly when lightly pinched and released.   Dark urine and decreased urine production.   Decreased tear production.   Headache.  Severe dehydration  Very dry mouth.   Extreme thirst.   Rapid, weak pulse (more than 100 beats per minute at rest).   Cold hands and feet.   Not able to sweat in spite of heat and temperature.   Rapid breathing.   Blue lips.   Confusion and lethargy.   Difficulty being awakened.   Minimal urine production.   No tears.  DIAGNOSIS  Your caregiver will diagnose dehydration based on your symptoms and your exam. Blood and urine tests will help confirm the diagnosis. The diagnostic evaluation should also identify the cause of dehydration. TREATMENT  Treatment of mild or moderate dehydration can often be done at home by increasing the amount of fluids that you drink. It is best to drink small amounts of fluid more often. Drinking too much at one time can make vomiting worse. Refer to the home care instructions below. Severe dehydration needs to be treated at the hospital where you will probably be given intravenous (IV) fluids that contain water and electrolytes. HOME CARE INSTRUCTIONS   Ask your caregiver about specific rehydration instructions.   Drink enough fluids to keep your urine clear or pale yellow.   Drink small amounts frequently if you have nausea and vomiting.   Eat as you normally do.   Avoid:   Foods or drinks high in  sugar.   Carbonated drinks.   Juice.   Extremely hot or cold fluids.   Drinks with caffeine.   Fatty, greasy foods.   Alcohol.   Tobacco.   Overeating.   Gelatin desserts.   Wash your hands well to avoid spreading bacteria and viruses.   Only take over-the-counter or prescription medicines for pain, discomfort, or fever as directed by your caregiver.   Ask your caregiver if you should continue all prescribed and over-the-counter medicines.   Keep all follow-up appointments with your caregiver.  SEEK MEDICAL CARE IF:  You have abdominal pain and it increases or stays in one area (localizes).   You have a rash, stiff neck, or severe headache.   You are irritable, sleepy, or difficult to awaken.   You are weak, dizzy, or extremely thirsty.  SEEK IMMEDIATE MEDICAL CARE IF:   You are unable to keep fluids down or you get worse despite treatment.   You have frequent episodes of vomiting or diarrhea.   You have blood or green matter (bile) in your vomit.   You have blood in your stool or your stool looks black and tarry.   You have not urinated in 6 to 8 hours, or you have only urinated a small amount of very dark urine.   You have a fever.   You faint.  MAKE SURE YOU:   Understand these instructions.   Will watch your condition.     Will get help right away if you are not doing well or get worse.  Document Released: 12/28/2004 Document Revised: 12/17/2010 Document Reviewed: 08/17/2010 [employer] Patient Information 2012 [employer]. 

## 2011-06-23 NOTE — Progress Notes (Signed)
  Subjective:    Patient ID: [name], female    DOB: 05/18/1920, 78 y.o.   MRN: [id number]  HPI    78 y.o. Cauc female c/o dizziness with nausea, sudden onset on Friday AM (5 days ago); pt  thinks it may be due to Nexium which was started ~ 3 weeks ago by Dr. Matthias Hughs.  Took last pill  yesterday AM. She was having vertigo-like symptoms which have resolved. Was evaluated by  Dr. Haroldine Laws ( ENT) 2 days ago and he could not find any problem and told her that it maybe "viral".  Always has a headache. Hydration not very good. Appetite- she  nibbles throughout the day.  She sees Dr. Annabell Howells next week for chronic bladder issues.    Review of Systems  Constitutional: Negative for fever, chills and fatigue.  Cardiovascular: Negative for chest pain and palpitations.  Gastrointestinal: Negative.   Neurological: Positive for dizziness and headaches. Negative for syncope, weakness and numbness.  Psychiatric/Behavioral: Negative.        Objective:   Physical Exam  Nursing note and vitals reviewed. Constitutional: She is oriented to person, place, and time. She appears well-developed and well-nourished. No distress.  HENT:  Head: Normocephalic and atraumatic.  Right Ear: External ear normal.  Left Ear: External ear normal.  Mouth/Throat: Oropharynx is clear and moist.  Eyes: Conjunctivae and EOM are normal. No scleral icterus.  Neck: No thyromegaly present.  Cardiovascular:       Irreg rate c/w Chronic Atrial Fib  Pulmonary/Chest: Effort normal and breath sounds normal. No respiratory distress.  Abdominal: Soft. She exhibits no distension and no mass. There is no tenderness.  Musculoskeletal:       Degenerative changes in majority of joints  Lymphadenopathy:    She has no cervical adenopathy.  Neurological: She is alert and oriented to person, place, and time. No cranial nerve deficit. Coordination normal.  Skin: Skin is warm and dry.  Psychiatric: She has a normal mood and affect.  Her behavior is normal.          Assessment & Plan:   1. Dizziness - pt feels better off Nexium; she notes that dizziness is one of the side effects of this medication POCT glucose (manual entry), POCT urinalysis dipstick, POCT CBC, Vitamin B12, Vitamin D, 25-hydroxy, Comprehensive metabolic panel  2. Abnormal CBC  Vitamin B12  3. Osteopenia  Vitamin D, 25-hydroxy  4. Hyperlipidemia  Comprehensive metabolic panel, Lipid panel  5. Paresthesia  Vitamin B12   Pt had  A screening done 02/24/2011 at [facility] which showed significant Carotid Artery Disease, ATrial Fibrillation, No Abdominal Aortic Aneurysm, and low risk for Osteoporosis. Thsi report is scanned into EMR.

## 2011-06-24 LAB — VITAMIN D 25 HYDROXY (VIT D DEFICIENCY, FRACTURES): Vit D, 25-Hydroxy: 42 ng/mL (ref 30–89)

## 2011-06-28 ENCOUNTER — Telehealth: Payer: Self-pay

## 2011-06-28 NOTE — Progress Notes (Signed)
Quick Note:  Please call pt and advise that the following labs are abnormal... Lipids (cholesterol and triglycerides) are a little above normal. Other labs are normal. No changes in health management except eating as healthy as possible.   Copy to pt. ______

## 2011-06-28 NOTE — Telephone Encounter (Signed)
PT WOULD LIKE FURTHER INSTRUCTIONS ON LAST WEEKS DIAGNOSIS   PLEASE ADVISE

## 2011-06-29 ENCOUNTER — Telehealth: Payer: Self-pay

## 2011-06-29 NOTE — Telephone Encounter (Signed)
PT STILL FEELS DIZZY--SHE SAYS IT ISN'T CONSTANT, BUT IT HASN'T COMPLETELY GONE AWAY. WHAT SHOULD SHE DO? IS THERE ANYTHING SHE CAN DO AT HOME TO HELP WITH THIS?

## 2011-06-29 NOTE — Telephone Encounter (Signed)
Pt still feels dizzy. She does seem to feel a little better but still has spells when she turns or gets up.  She wonders if there is anything else she can do to help feel better.

## 2011-06-30 ENCOUNTER — Ambulatory Visit (INDEPENDENT_AMBULATORY_CARE_PROVIDER_SITE_OTHER): Payer: Medicare Other | Admitting: *Deleted

## 2011-06-30 DIAGNOSIS — I4891 Unspecified atrial fibrillation: Secondary | ICD-10-CM | POA: Diagnosis not present

## 2011-06-30 DIAGNOSIS — N3941 Urge incontinence: Secondary | ICD-10-CM | POA: Diagnosis not present

## 2011-06-30 DIAGNOSIS — N83209 Unspecified ovarian cyst, unspecified side: Secondary | ICD-10-CM | POA: Diagnosis not present

## 2011-06-30 LAB — POCT INR: INR: 1.9

## 2011-06-30 NOTE — Telephone Encounter (Signed)
LMOM to call back

## 2011-06-30 NOTE — Telephone Encounter (Signed)
It looks like pt has an appt on 07/01/11 for re-check; perhaps I will have some advise or treatment options after tomorrow's visit.

## 2011-07-01 ENCOUNTER — Ambulatory Visit (INDEPENDENT_AMBULATORY_CARE_PROVIDER_SITE_OTHER): Payer: Medicare Other | Admitting: Family Medicine

## 2011-07-01 VITALS — BP 134/74 | HR 75 | Temp 98.0°F | Resp 16 | Ht 62.5 in | Wt 119.0 lb

## 2011-07-01 DIAGNOSIS — I679 Cerebrovascular disease, unspecified: Secondary | ICD-10-CM

## 2011-07-01 DIAGNOSIS — H8110 Benign paroxysmal vertigo, unspecified ear: Secondary | ICD-10-CM | POA: Diagnosis not present

## 2011-07-01 DIAGNOSIS — R1013 Epigastric pain: Secondary | ICD-10-CM

## 2011-07-01 DIAGNOSIS — J3489 Other specified disorders of nose and nasal sinuses: Secondary | ICD-10-CM | POA: Diagnosis not present

## 2011-07-01 DIAGNOSIS — K3189 Other diseases of stomach and duodenum: Secondary | ICD-10-CM | POA: Diagnosis not present

## 2011-07-01 NOTE — Telephone Encounter (Signed)
PT SEEN IN OFFICE AT [facility]

## 2011-07-01 NOTE — Patient Instructions (Signed)
I have given you some information about VERTIGO; You may need to have another Brain CT done because you do have small blood vessel disease throughout your brain matter.  Stop taking the Claritin and try OTC Cetirizine 5 mg chewable tablet to see if this helps your runny nose better than the Claritin. Continue the Hydroxyzine at bedtime for now. Contact Dr. Matthias Hughs about resuming the Nexium- maybe at a lower dose(20 mg instead of 40 mg).

## 2011-07-01 NOTE — Progress Notes (Signed)
  Subjective:    Patient ID: Vanessa [name], female    DOB: 10/31/1920, 78 y.o.   MRN: [id number]  HPI 78 y.o. Cauc female returns because of persistent dizziness, somewhat improved- only bothers  her in the AM and at night before bed. Notes symptom with sudden turning of head and with positions  changes in the bed at night. No nausea or vision disturbances. This is a chronic problem dating back  a few years (pt has been on Meclizine in past for this).   She is also concerned about symptoms related to ineffective medications- itching not relieved with  Claritin but making rhinorrhea worse, Hydroxyzine at bedtime not really effective for itching.  She is scheduled to see DERM within next month. Skin better with OTC topicals(Aveeno, etc).   Also GI symptoms slightly worse with bad taste in back of throat since stopping Nexium and sulcrafate.  She is scheduled to see Dr. Matthias Hughs soon     Review of Systems  Constitutional: Positive for appetite change. Negative for fever, chills, diaphoresis, activity change and unexpected weight change.  HENT: Positive for rhinorrhea, trouble swallowing and postnasal drip. Negative for ear pain, congestion, sore throat, sneezing and sinus pressure.   Eyes: Negative.   Respiratory: Negative for choking, chest tightness, shortness of breath and wheezing.   Cardiovascular: Negative.   Gastrointestinal: Negative for nausea, vomiting and abdominal pain.       Reflux since not taking Nexium   Skin: Positive for rash.       Pruritis  Neurological: Positive for dizziness. Negative for syncope, speech difficulty, weakness, light-headedness, numbness and headaches.  Psychiatric/Behavioral: The patient is nervous/anxious.        Objective:   Physical Exam  Nursing note and vitals reviewed. Constitutional: She is oriented to person, place, and time. She appears well-developed and well-nourished. No distress.  HENT:  Head: Normocephalic and atraumatic.    Right Ear: External ear normal.  Left Ear: External ear normal.  Mouth/Throat: Oropharynx is clear and moist.  Eyes: Conjunctivae and EOM are normal. No scleral icterus.  Pulmonary/Chest: Effort normal. No respiratory distress.  Musculoskeletal: She exhibits no edema.  Neurological: She is alert and oriented to person, place, and time. No cranial nerve deficit. Coordination normal.  Skin: Skin is warm and dry.  Psychiatric: She has a normal mood and affect. Her behavior is normal. Judgment and thought content normal.    Review of Imaging study from July 2011: CT head shows chronic microvascular ischemia and stable infarct                                                                     in right putamen      Assessment & Plan:   1. Vertigo, benign positional - symptoms improving   2. Cerebrovascular small vessel disease - pt has carotid disease which is monitored by Dr. Edilia Bo CT Head Wo Contrast for comparison with previous study; pt had small infarct noted 2 years ago.  3. Rhinorrhea - pt attributes this to medication; recently had eval by ENT Discontinue Claritin Try OTC Cetirizine 5 mg chewable tab available as pt has trouble with tablets  4. Dyspepsia  Follow-up with GI as scheduled

## 2011-07-03 ENCOUNTER — Encounter: Payer: Self-pay | Admitting: Family Medicine

## 2011-07-04 NOTE — Telephone Encounter (Signed)
Does not look like patient has been contacted, msg taken 6.18

## 2011-07-04 NOTE — Telephone Encounter (Signed)
Pt was seen in office on 07/01/11

## 2011-07-07 DIAGNOSIS — R42 Dizziness and giddiness: Secondary | ICD-10-CM | POA: Diagnosis not present

## 2011-07-16 DIAGNOSIS — R42 Dizziness and giddiness: Secondary | ICD-10-CM | POA: Diagnosis not present

## 2011-07-16 DIAGNOSIS — H903 Sensorineural hearing loss, bilateral: Secondary | ICD-10-CM | POA: Diagnosis not present

## 2011-07-21 ENCOUNTER — Ambulatory Visit
Admission: RE | Admit: 2011-07-21 | Discharge: 2011-07-21 | Disposition: A | Discharge status: Home / Self Care | Payer: Medicare Other | Source: Ambulatory Visit | Attending: Family Medicine | Admitting: Family Medicine

## 2011-07-21 ENCOUNTER — Ambulatory Visit (INDEPENDENT_AMBULATORY_CARE_PROVIDER_SITE_OTHER): Payer: Medicare Other | Admitting: *Deleted

## 2011-07-21 DIAGNOSIS — I4891 Unspecified atrial fibrillation: Secondary | ICD-10-CM

## 2011-07-21 DIAGNOSIS — R42 Dizziness and giddiness: Secondary | ICD-10-CM | POA: Diagnosis not present

## 2011-07-21 DIAGNOSIS — R51 Headache: Secondary | ICD-10-CM | POA: Diagnosis not present

## 2011-07-21 DIAGNOSIS — I679 Cerebrovascular disease, unspecified: Secondary | ICD-10-CM

## 2011-07-21 DIAGNOSIS — I6789 Other cerebrovascular disease: Secondary | ICD-10-CM | POA: Diagnosis not present

## 2011-07-21 LAB — POCT INR: INR: 1.9

## 2011-07-22 DIAGNOSIS — R42 Dizziness and giddiness: Secondary | ICD-10-CM | POA: Diagnosis not present

## 2011-07-22 NOTE — Progress Notes (Signed)
Quick Note:  Please call pt and advise her that head CT is the same as 2 years ago. ______

## 2011-07-27 ENCOUNTER — Ambulatory Visit: Payer: Medicare Other

## 2011-07-27 ENCOUNTER — Ambulatory Visit (INDEPENDENT_AMBULATORY_CARE_PROVIDER_SITE_OTHER): Payer: Medicare Other | Admitting: Family Medicine

## 2011-07-27 VITALS — BP 112/62 | HR 97 | Temp 98.5°F | Resp 17 | Ht 62.0 in | Wt 119.0 lb

## 2011-07-27 DIAGNOSIS — T148XXA Other injury of unspecified body region, initial encounter: Secondary | ICD-10-CM | POA: Diagnosis not present

## 2011-07-27 DIAGNOSIS — Z79899 Other long term (current) drug therapy: Secondary | ICD-10-CM | POA: Diagnosis not present

## 2011-07-27 DIAGNOSIS — W19XXXA Unspecified fall, initial encounter: Secondary | ICD-10-CM

## 2011-07-27 LAB — POCT CBC
Granulocyte percent: 69 %G (ref 37–80)
HCT, POC: 45.1 % (ref 37.7–47.9)
Hemoglobin: 14 g/dL (ref 12.2–16.2)
Lymph, poc: 1.8 (ref 0.6–3.4)
MCH, POC: 31.3 pg — AB (ref 27–31.2)
MCHC: 31 g/dL — AB (ref 31.8–35.4)
MCV: 101 fL — AB (ref 80–97)
MID (cbc): 0.7 (ref 0–0.9)
MPV: 9 fL (ref 0–99.8)
POC Granulocyte: 5.6 (ref 2–6.9)
POC LYMPH PERCENT: 21.9 %L (ref 10–50)
POC MID %: 9.1 %M (ref 0–12)
Platelet Count, POC: 263 10*3/uL (ref 142–424)
RBC: 4.47 M/uL (ref 4.04–5.48)
RDW, POC: 14.1 %
WBC: 8.1 10*3/uL (ref 4.6–10.2)

## 2011-07-27 LAB — PROTIME-INR
INR: 1.98 — ABNORMAL HIGH (ref <1.50)
Prothrombin Time: 23.2 seconds — ABNORMAL HIGH (ref 11.6–15.2)

## 2011-07-27 NOTE — Patient Instructions (Signed)
Home Safety and Preventing Falls Falls are a leading cause of injury and while they affect all age groups, falls have greater short-term and long-term impact on older age groups. However, falls should not be a part of life or aging. It is possible for individuals and their families to use preventive measures to significantly decrease the likelihood that anyone, especially an older adult, will fall. There are many simple measures which can make your home safer with respect to preventing falls. The following actions can help reduce falls among all members of your family and are especially important as you age, when your balance, lower limb strength, coordination, and eyesight may be declining. The use of preventive measures will help to reduce you and your family's risk of falls and serious medical consequences. OUTDOORS  Repair cracks and edges of walkways and driveways.   Remove high doorway thresholds and trim shrubbery on the main path into your home.   Ensure there is good outside lighting at main entrances and along main walkways.   Clear walkways of tools, rocks, debris, and clutter.   Check that handrails are not broken and are securely fastened. Both sides of steps should have handrails.   In the garage, be attentive to and clean up grease or oil spills on the cement. This can make the surface extremely slippery.   In winter, have leaves, snow, and ice cleared regularly.   Use sand or salt on walkways during winter months.  BATHROOM  Install grab bars by the toilet and in the tub and shower.   Use non-skid mats or decals in the tub or shower.   If unable to easily stand unsupported while showering, place a plastic non slip stool in the shower to sit on when needed.   Install night lights.   Keep floors dry and clean up all water on the floor immediately.   Remove soap buildup in tub or shower on a regular basis.   Secure bath mats with non-slip, double-sided rug tape.    Remove tripping hazards from the floors.  BEDROOMS  Install night lights.   Do not use oversized bedding.   Make sure a bedside light is easy to reach.   Keep a telephone by your bedside.   Make sure that you can get in and out of your bed easily.   Have a firm chair, with side arms, to use for getting dressed.   Remove clutter from around closets.   Store clothing, bed coverings, and other household items where you can reach them comfortably.   Remove tripping hazards from the floor.  LIVING AREAS AND STAIRWAYS  Turn on lights to avoid having to walk through dark areas.   Keep lighting uniform in each room. Place brighter lightbulbs in darker areas, including stairways.   Replace lightbulbs that burn out in stairways immediately.   Arrange furniture to provide for clear pathways.   Keep furniture in the same place.   Eliminate or tape down electrical cables in high traffic areas.   Place handrails on both sides of stairways. Use handrails when going up or down stairs.   Most falls occur on the top or bottom 3 steps.   Fix any loose handrails. Make sure handrails on both sides of the stairways are as long as the stairs.   Remove all walkway obstacles.   Coil or tape electrical cords off to the side of walking areas and out of the way. If using many extension cords, have an electrician   put in a new wall outlet to reduce or eliminate them.   Make sure spills are cleaned up quickly and allow time for drying before walking on freshly cleaned floors.   Firmly attach carpet with non-skid or two-sided tape.   Keep frequently used items within easy reach.   Remove tripping hazards such as throw rugs and clutter in walkways. Never leave objects on stairs.   Get rid of throw rugs elsewhere if possible.   Eliminate uneven floor surfaces.   Make sure couches and chairs are easy to get into and out of.   Check carpeting to make sure it is firmly attached along stairs.    Make repairs to worn or loose carpet promptly.   Select a carpet pattern that does not visually hide the edge of steps.   Avoid placing throw rugs or scatter rugs at the top or bottom of stairways, or properly secure with carpet tape to prevent slippage.   Have an electrician put in a light switch at the top and bottom of the stairs.   Get light switches that glow.   Avoid the following practices: hurrying, inattention, obscured vision, carrying large loads, and wearing slip-on shoes.   Be aware of all pets.  KITCHEN  Place items that are used frequently, such as dishes and food, within easy reach.   Keep handles on pots and pans toward the center of the stove. Use back burners when possible.   Make sure spills are cleaned up quickly and allow time for drying.   Avoid walking on wet floors.   Avoid hot utensils and knives.   Position shelves so they are not too high or low.   Place commonly used objects within easy reach.   If necessary, use a sturdy step stool with a grab bar when reaching.   Make sure electrical cables are out of the way.   Do not use floor polish or wax that makes floors slippery.  OTHER HOME FALL PREVENTION STRATEGIES  Wear low heel or rubber sole shoes that are supportive and fit well.   Wear closed toe shoes.   Know and watch for side effects of medications. Have your caregiver or pharmacist look at all your medicines, even over-the-counter medicines. Some medicines can make you sleepy or dizzy.   Exercise regularly. Exercise makes you stronger and improves your balance and coordination.   Limit use of alcohol.   Use eyeglasses if necessary and keep them clean. Have your vision checked every year.   Organize your household in a manner that minimizes the need to walk distances when hurried, or go up and down stairs unnecessarily. For example, have a phone placed on at least each floor of your home. If possible, have a phone beside each sitting  or lying area where you spend the most time at home. Keep emergency numbers posted at all phones.   Use non-skid floor wax.   When using a ladder, make sure:   The base is firm.   All ladder feet are on level ground.   The ladder is angled against the wall properly.   When climbing a ladder, face the ladder and hold the ladder rungs firmly.   If reaching, always keep your hips and body weight centered between the rails.   When using a stepladder, make sure it is fully opened and both spreaders are firmly locked.   Do not climb a closed stepladder.   Avoid climbing beyond the second step from the top   of a stepladder and the 4th rung from the top of an extension ladder.   Learn and use mobility aids as needed.   Change positions slowly. Arise slowly from sitting and lying positions. Sit on the edge of your bed before getting to your feet.   If you have a history of falls, ask someone to add color or contrast paint or tape to grab bars and handrails in your home.   If you have a history of falls, ask someone to place contrasting color strips on first and last steps.   Install an electrical emergency response system if you need one, and know how to use it.   If you have a medical or other condition that causes you to have limited physical strength, it is important that you reach out to family and friends for occasional help.  FOR CHILDREN:  If young children are in the home, use safety gates. At the top of stairs use screw-mounted gates; use pressure-mounted gates for the bottom of the stairs and doorways between rooms.   Young children should be taught to descend stairs on their stomachs, feet first, and later using the handrail.   Keep drawers fully closed to prevent them from being climbed on or pulled out entirely.   Move chairs, cribs, beds and other furniture away from windows.   Consider installing window guards on windows ground floor and up, unless they are emergency  fire exits. Make sure they have easy release mechanisms.   Consider installing special locks that only allow the window to be opened to a certain height.   Never rely on window screens to prevent falls.   Never leave babies alone on changing tables, beds or sofas. Use a changing table that has a restraining strap.   When a child can pull to a standing position, the crib mattress should be adjusted to its lowest position. There should be at least 26 inches between the top rails of the crib drop side and the mattress. Toys, bumper pads, and other objects that can be used as steps to climb out should be removed from the crib.   On bunk beds never allow a child under age 6 to sleep on the top bunk. For older children, if the upper bunk is not against a wall, use guard rails on both sides. No matter how old a child is, keep the guard rails in place on the top bunk since children roll during sleep. Do not permit horseplay on bunks.   Grass and soil surfaces beneath backyard playground equipment should be replaced with hardwood chips, shredded wood mulch, sand, pea gravel, rubber, crushed stone, or another safer material at depths of at least 9 to 12 inches.   When riding bikes or using skates, skateboards, skis, or snowboards, require children to wear helmets. Look for those that have stickers stating that they meet or exceed safety standards.   Vertical posts or pickets in deck, balcony, and stairway railings should be no more than 3 1/2 inches apart if a young baby will have access to the area. The space between horizontal rails or bars, and between the floor and the first horizontal rail or bar, should be no more than 3 1/2 inches.  Document Released: 12/18/2001 Document Revised: 12/17/2010 Document Reviewed: 10/17/2008 [employer] Patient Information 2012 [employer]. 

## 2011-07-27 NOTE — Progress Notes (Signed)
  Subjective:    Patient ID: Vanessa [name], female    DOB: [birth year], 78 y.o.   MRN: [id number]  HPI Pt presents with chief complaint of L leg pain s/p fall Pt fell out of chair last night while watching TV Pt currently lives by herself.  EMS was called.  Pt did not go to ER.  Pt also on coumadin.  Per pt, last INR 1.9 approx 4 weeks ago.  No head trauma.  No significant swelling. Has had anterior L shin pain with mild bruising.  Also with mild R elbow bruising. No elbow pain.   Review of Systems See HPI, otherwise ROS negative     Objective:   Physical Exam Gen: up in chair, NAD HEENT: NCAT, EOMI, TMs clear bilaterally CV: RRR, no murmurs auscultated PULM: CTAB, no wheezes, rales, rhoncii ABD: S/NT/+ bowel sounds  EXT: 2+ peripheral pulses + TTP across L distal shin + 5x3 area of ecchymoses + 2x3 cm area of ecchymoses across R elbow, no pain  Full elbow ROM    [facility] reading (PRIMARY) by  Dr. Alvester Morin. R elbow xray and L tib fib preliminarily negative for fracture.    Assessment & Plan:  Xrays preliminarily negative for fracture s/p fall.  CBC reassuring.  Will also check INR.  Will also refer for formal HH assessment as pt may need home health assistant at home.  Discussed general red flags.  Follow up with PCP  Handout given.     The patient and/or caregiver has been counseled thoroughly with regard to treatment plan and/or medications prescribed including dosage, schedule, interactions, rationale for use, and possible side effects and they verbalize understanding. Diagnoses and expected course of recovery discussed and will return if not improved as expected or if the condition worsens. Patient and/or caregiver verbalized understanding.

## 2011-07-28 ENCOUNTER — Ambulatory Visit (INDEPENDENT_AMBULATORY_CARE_PROVIDER_SITE_OTHER): Payer: Medicare Other | Admitting: Family Medicine

## 2011-07-28 ENCOUNTER — Encounter: Payer: Self-pay | Admitting: Family Medicine

## 2011-07-28 VITALS — BP 98/61 | HR 74 | Temp 98.6°F | Resp 16 | Ht 62.5 in | Wt 119.0 lb

## 2011-07-28 DIAGNOSIS — S8010XA Contusion of unspecified lower leg, initial encounter: Secondary | ICD-10-CM | POA: Diagnosis not present

## 2011-07-28 DIAGNOSIS — I10 Essential (primary) hypertension: Secondary | ICD-10-CM | POA: Diagnosis not present

## 2011-07-30 ENCOUNTER — Encounter: Payer: Self-pay | Admitting: Family Medicine

## 2011-07-30 NOTE — Progress Notes (Signed)
S:  This 78 y.o. Cauc female had a fall in her bedroom on the evening of 07/26/11 (she sat on the corner of the mattress at the foot of her bed and slid off). She has a large hematoma on left anterior shin; she states it has decreased a lot and is not     as painful. She has concerns in general about skin discolorations, thinnest of her skin and multiple lesions. She knows that this is partly due to being on Coumadin and because of her advanced age.     A Home Health evaluation (PT) was ordered when she was at [facility] on 07/27/11; pt does not feel that this is necessary or appropriate. She can perform all ADLs, drives daily, has "resources" (friends and neighbors who check on her) in the   community. (I advised her that when [facility] calls to set up an appointment to come to her home to do the assessment, she can inform the agency of her health status and her ability for self-care, etc. She thinks this is reasonable).  O: Vital signs reviewed   IN NAD; WN,WD   Weight is stable  Pt is A&O x 3      Left leg: large tender hematoma anterior lower shin; skin is intact (noted to be very thin and fragile with multiple areas of discoloration)   Neurovascular intact.      Pt can ambulate without difficult; is steady on feet.       Reviewed labs from yesterday. PT/INR is monitored every 3-4 weeks at Coumadin clinic by Cardiology Practice  A:P 1. Hematoma of leg - stable and slowly improving; expect slow resolution over several weeks                                  Pt acknowledges need to be very careful because of skin fragility/tears  +                                   Coumadin  2. HTN (hypertension) - maintain good hydration during this hot weather; continue current meds and follow-up with Cardiology as scheduled

## 2011-08-01 ENCOUNTER — Ambulatory Visit (INDEPENDENT_AMBULATORY_CARE_PROVIDER_SITE_OTHER): Payer: Medicare Other | Admitting: Family Medicine

## 2011-08-01 VITALS — BP 130/72 | HR 76 | Temp 97.8°F | Resp 16 | Ht 62.5 in | Wt 119.6 lb

## 2011-08-01 DIAGNOSIS — T148XXA Other injury of unspecified body region, initial encounter: Secondary | ICD-10-CM | POA: Diagnosis not present

## 2011-08-01 NOTE — Progress Notes (Signed)
[facility]:  Office Visit  Chief Complaint:  Chief Complaint  Patient presents with  . Follow-up     from fall  left lower  eg       HPI: Vanessa [name] is a 78 y.o. female who complains of check-up for left lower leg hematoma s/p fall. On warfarin for A. Fib. Xrays s/p fall negative  Past Medical History  Diagnosis Date  . Atrial fibrillation   . Neuropathy     PERIPHERAL  . Carotid artery occlusion     LEFT  . Hypercholesterolemia   . Scoliosis   . Hoarseness   . Headache   . Dizziness   . Abdominal bloating   . Bruises easily   . Pruritus    No past surgical history on file. History   Social History  . Marital Status: Widowed    Spouse Name: N/A    Number of Children: N/A  . Years of Education: N/A   Social History Main Topics  . Smoking status: Never Smoker   . Smokeless tobacco: None  . Alcohol Use: No  . Drug Use: No  . Sexually Active:    Other Topics Concern  . None   Social History Narrative  . None   Family History  Problem Relation Age of Onset  . Heart disease     Allergies  Allergen Reactions  . Tape Other (See Comments)    Tears skin.   Prior to Admission medications   Medication Sig Start Date End Date Taking? Authorizing Provider  acetaminophen (TYLENOL) 500 MG tablet Take 500 mg by mouth every 6 (six) hours as needed.   Yes Historical Provider, MD  ALPRAZolam (XANAX) 0.25 MG tablet Take 1 tablet (0.25 mg total) by mouth daily as needed. 12/29/10  Yes Cassell Clement, MD  aspirin 81 MG tablet Take 1 tablet (81 mg total) by mouth every Monday, Wednesday, and Friday. 06/26/10  Yes Cassell Clement, MD  atenolol (TENORMIN) 25 MG tablet Take 1 tablet (25 mg total) by mouth 2 (two) times daily. 03/31/11  Yes Cassell Clement, MD  BIOTIN 5000 PO Take by mouth daily.   Yes Historical Provider, MD  Calcium-Vitamin D-Vitamin K (VIACTIV PO) Take by mouth.   Yes Historical Provider, MD  cetirizine (ZYRTEC) 10 MG tablet  Take 10 mg by mouth daily.   Yes Historical Provider, MD  Esomeprazole Magnesium (NEXIUM PO) Take by mouth at bedtime as needed.   Yes Historical Provider, MD  hydrOXYzine (ATARAX) 10 MG tablet Take 10 mg by mouth 2 (two) times daily.    Yes Historical Provider, MD  Loratadine (CLARITIN PO) Take by mouth daily.   Yes Historical Provider, MD  Multiple Vitamin (MULTIVITAMIN) tablet Take 1 tablet by mouth daily.     Yes Historical Provider, MD  simvastatin (ZOCOR) 40 MG tablet TAKE 1/2 TABLET EVERY DAY 03/24/11  Yes Cassell Clement, MD  sucralfate (CARAFATE) 1 GM/10ML suspension Take medication 10 cc 3 times a day and at bedtime on an empty stomach 04/16/11  Yes Collene Gobble, MD  triamcinolone (KENALOG) 0.1 % cream Apply topically as needed.     Yes Historical Provider, MD  warfarin (COUMADIN) 2 MG tablet TAKE 1 TABLET EVERY DAY 5 DAYS PER WEEK AND 2 TABLETS DAILY 2 DAYS PER WEEK OR AS DIRECTED 11/25/10  Yes Cassell Clement, MD     ROS: The patient denies fevers, chills, night sweats, unintentional weight loss, chest pain, palpitations, wheezing, dyspnea on exertion, nausea,  vomiting, abdominal pain, dysuria, hematuria, melena, numbness, weakness, or tingling.   All other systems have been reviewed and were otherwise negative with the exception of those mentioned in the HPI and as above.    PHYSICAL EXAM: Filed Vitals:   08/01/11 1214  BP: 130/72  Pulse: 76  Temp: 97.8 F (36.6 C)  Resp: 16   Filed Vitals:   08/01/11 1214  Height: 5' 2.5" (1.588 m)  Weight: 119 lb 9.6 oz (54.25 kg)   Body mass index is 21.53 kg/(m^2).  General: Alert, no acute distress HEENT:  Normocephalic, atraumatic, oropharynx patent.  Cardiovascular:  Irregular rate and rhythm;  radial pulse intact. No pedal edema. + varicose veins Respiratory: Clear to auscultation bilaterally.  No wheezes, rales, or rhonchi.  No cyanosis, no use of accessory musculature GI: No organomegaly, abdomen is soft and non-tender,  positive bowel sounds.  No masses. Skin: + thin, ecchymosis Neurologic: Facial musculature symmetric. Psychiatric: Patient is appropriate throughout our interaction. Lymphatic: No cervical lymphadenopathy Musculoskeletal: Gait intact. Left lower leg warfarin blister, soft, no tenting of skin. No signs of infection.  LABS: Results for orders placed in visit on 07/27/11  POCT CBC      Component Value Range   WBC 8.1  4.6 - 10.2 K/uL   Lymph, poc 1.8  0.6 - 3.4   POC LYMPH PERCENT 21.9  10 - 50 %L   MID (cbc) 0.7  0 - 0.9   POC MID % 9.1  0 - 12 %M   POC Granulocyte 5.6  2 - 6.9   Granulocyte percent 69.0  37 - 80 %G   RBC 4.47  4.04 - 5.48 M/uL   Hemoglobin 14.0  12.2 - 16.2 g/dL   HCT, POC 16.1  09.6 - 47.9 %   MCV 101 (*) 80 - 97 fL   MCH, POC 31.3 (*) 27 - 31.2 pg   MCHC 31.0 (*) 31.8 - 35.4 g/dL   RDW, POC 04.5     Platelet Count, POC 263  142 - 424 K/uL   MPV 9.0  0 - 99.8 fL  PROTIME-INR      Component Value Range   Prothrombin Time 23.2 (*) 11.6 - 15.2 seconds   INR 1.98 (*) <1.50     EKG/XRAY:   Primary read interpreted by Dr. Conley Rolls at [facility].   ASSESSMENT/PLAN: Encounter Diagnosis  Name Primary?  . Hematoma Yes   Small warfarin hematoma Decline needle aspiration Monitor for s/sx infection Wrap with Kerlex and Nanetta Batty PHUONG, DO 08/01/2011 12:44 PM

## 2011-08-05 ENCOUNTER — Ambulatory Visit (INDEPENDENT_AMBULATORY_CARE_PROVIDER_SITE_OTHER): Payer: Medicare Other | Admitting: Family Medicine

## 2011-08-05 ENCOUNTER — Encounter: Payer: Self-pay | Admitting: Family Medicine

## 2011-08-05 VITALS — BP 132/60 | HR 89 | Temp 98.7°F | Resp 16 | Ht 61.0 in | Wt 118.6 lb

## 2011-08-05 DIAGNOSIS — M25569 Pain in unspecified knee: Secondary | ICD-10-CM | POA: Diagnosis not present

## 2011-08-05 DIAGNOSIS — S8010XA Contusion of unspecified lower leg, initial encounter: Secondary | ICD-10-CM

## 2011-08-05 NOTE — Patient Instructions (Signed)
Bone Bruise  A bone bruise is a small hidden fracture of the bone. It typically occurs with bones located close to the surface of the skin.  SYMPTOMS  The pain lasts longer than a normal bruise.   The bruised area is difficult to use.   There may be discoloration or swelling of the bruised area.   When a bone bruise is found with injury to the anterior cruciate ligament (in the knee) there is often an increased:   Amount of fluid in the knee   Time the fluid in the knee lasts.   Number of days until you are walking normally and regaining the motion you had before the injury.   Number of days with pain from the injury.  DIAGNOSIS  It can only be seen on X-rays known as MRIs. This stands for magnetic resonance imaging. A regular X-ray taken of a bone bruise would appear to be normal. A bone bruise is a common injury in the knee and the heel bone (calcaneus). The problems are similar to those produced by stress fractures, which are bone injuries caused by overuse. A bone bruise may also be a sign of other injuries. For example, bone bruises are commonly found where an anterior cruciate ligament (ACL) in the knee has been pulled away from the bone (ruptured). A ligament is a tough fibrous material that connects bones together to make our joints stable. Bruises of the bone last a lot longer than bruises of the muscle or tissues beneath the skin. Bone bruises can last from days to months and are often more severe and painful than other bruises. TREATMENT Because bone bruises are sudden injuries you cannot often prevent them, other than by being extremely careful. Some things you can do to improve the condition are:  Apply ice to the sore area for 15 to 20 minutes, 3 to 4 times per day while awake for the first 2 days. Put the ice in a plastic bag, and place a towel between the bag of ice and your skin.   Keep your bruised area raised (elevated) when possible to lessen swelling.   For activity:     Use crutches when necessary; do not put weight on the injured leg until you are no longer tender.   You may walk on your affected part as the pain allows, or as instructed.   Start weight bearing gradually on the bruised part.   Continue to use crutches or a cane until you can stand without causing pain, or as instructed.   If a plaster splint was applied, wear the splint until you are seen for a follow-up examination. Rest it on nothing harder than a pillow the first 24 hours. Do not put weight on it. Do not get it wet. You may take it off to take a shower or bath.   If an air splint was applied, more air may be blown into or out of the splint as needed for comfort. You may take it off at night and to take a shower or bath.   Wiggle your toes in the splint several times per day if you are able.   You may have been given an elastic bandage to use with the plaster splint or alone. The splint is too tight if you have numbness, tingling or if your foot becomes cold and blue. Adjust the bandage to make it comfortable.   Only take over-the-counter or prescription medicines for pain, discomfort, or fever as directed by   your caregiver.   Follow all instructions for follow up with your caregiver. This includes any orthopedic referrals, physical therapy, and rehabilitation. Any delay in obtaining necessary care could result in a delay or failure of the bones to heal.  SEEK MEDICAL CARE IF:   You have an increase in bruising, swelling, or pain.   You notice coldness of your toes.   You do not get pain relief with medications.  SEEK IMMEDIATE MEDICAL CARE IF:   Your toes are numb or blue.   You have severe pain not controlled with medications.   If any of the problems that caused you to seek care are becoming worse.  Document Released: 03/20/2003 Document Revised: 12/17/2010 Document Reviewed: 08/02/2007 [facility] Patient Information 2012 [place], [place].      For pain, you may try  Arnica gel which can be applied topically to help reduce pain.

## 2011-08-08 NOTE — Progress Notes (Signed)
S: This pleasant 78 y.o. Cauc female was seen one week ago for recheck of large anterior left leg contusion/hematoma. She takes Coumadin for cardiac disease. She is very concerned that the area will not improve and that it may spontaneously  bleed. She is very active but does try to get off her feet and elevated them for about 20-30 minutes every day. Otherwise, she has felt fine. No fever, CP or tightness, palpitations, dizziness, lightheadedness or weakness.  O: Vital signs and nurse notes reviewed      In NAD; WN, WD; A&O x3; dress and demeanor are appropriate      HENT: benign      NEURO: Nonfocal      EXT: Left anterior lower leg with egg-sized hematoma which appears to be resolving; bluish discoloration around medial aspect of ankle. Hematoma tender to light palpation. Severe varicosities again noted.No calf tenderness.  A/P:      1. Hematoma of leg - reassurance. Continue current care plan and moderate activity. Try OTC Arnica gel topically for discomfort. RTC 10 days for recheck.

## 2011-08-11 ENCOUNTER — Ambulatory Visit (INDEPENDENT_AMBULATORY_CARE_PROVIDER_SITE_OTHER): Payer: Medicare Other | Admitting: *Deleted

## 2011-08-11 DIAGNOSIS — I4891 Unspecified atrial fibrillation: Secondary | ICD-10-CM | POA: Diagnosis not present

## 2011-08-11 DIAGNOSIS — D239 Other benign neoplasm of skin, unspecified: Secondary | ICD-10-CM | POA: Diagnosis not present

## 2011-08-11 DIAGNOSIS — L821 Other seborrheic keratosis: Secondary | ICD-10-CM | POA: Diagnosis not present

## 2011-08-11 DIAGNOSIS — L819 Disorder of pigmentation, unspecified: Secondary | ICD-10-CM | POA: Diagnosis not present

## 2011-08-11 LAB — POCT INR: INR: 2.4

## 2011-08-13 ENCOUNTER — Encounter: Payer: Self-pay | Admitting: Family Medicine

## 2011-08-13 ENCOUNTER — Ambulatory Visit (INDEPENDENT_AMBULATORY_CARE_PROVIDER_SITE_OTHER): Payer: Medicare Other | Admitting: Family Medicine

## 2011-08-13 VITALS — BP 98/62 | HR 84 | Temp 98.0°F | Resp 16 | Ht 62.0 in | Wt 120.0 lb

## 2011-08-13 DIAGNOSIS — S8010XA Contusion of unspecified lower leg, initial encounter: Secondary | ICD-10-CM | POA: Diagnosis not present

## 2011-08-17 ENCOUNTER — Encounter: Payer: Self-pay | Admitting: Family Medicine

## 2011-08-17 NOTE — Progress Notes (Signed)
S: Pt returns today for re-evaluation of left lower leg/ contusion. Sh states it is very sore; she tries to elevate legs for more than 30 minutes a few times a day and is limiting ambulation. SHe is accompanied today by a friend, Dois Davenport, who has     some clinical experience. She monitors pt's activity and offers advise about care of skin lesion. Pt has been applying Arnica gel which helps relieve discomfort.   O:  Filed Vitals:   08/13/11 1406  BP: 98/62  Pulse: 84  Temp: 98 F (36.7 C)  Resp: 16   GEN: Pt in NAD; WN,WD. SKIN: Left lower leg- ecchymotic area about size of egg; blood-blister size of large kidney bean. Tender with lightest touch. No signs of cellulitis or infection. NEURO: CNs intact; nonfocal. Pt ambulating with cane. (Her friend, Dois Davenport, is instructing pt in best use of this device).   A/P: 1. Hematoma of leg - continue to monitor ; reassurance.

## 2011-08-19 ENCOUNTER — Ambulatory Visit (INDEPENDENT_AMBULATORY_CARE_PROVIDER_SITE_OTHER): Payer: Medicare Other | Admitting: Family Medicine

## 2011-08-19 VITALS — BP 110/90 | HR 80 | Temp 97.6°F | Resp 18 | Ht 61.5 in | Wt 120.0 lb

## 2011-08-19 DIAGNOSIS — S8010XA Contusion of unspecified lower leg, initial encounter: Secondary | ICD-10-CM | POA: Diagnosis not present

## 2011-08-19 NOTE — Progress Notes (Signed)
S: Pt worried because hematoma seems to have expands after shower yesterday. Pt applied a dressing and comes in today for assessment. The area is less tender to touch. She has had no fever. She is elevating legs as directed.  O:  Filed Vitals:   08/19/11 1053  BP: 110/90  Pulse: 80  Temp: 97.6 F (36.4 C)  Resp: 18   GEN: In NAD; WN, WD; well dressed and neat in appearance HENT: [place]/AT; EOMI, conj/scl clear; otherwise benign COR: RRR; no edema LUNGS: Normal resp rate and effort NEURO: A&O x 3; Nonfocal. SKIN: Left leg: anterior shin- hematoma is larger ( about size and color of plum); brawny edema surrounds  hematoma; no active bleeding or warmth or streaky redness.   A/P:    1. Hematoma of leg - reassurance; nonadhesive dressing applied today; continue to elevate legs (Chronic Coumadin therapy contributes to slow resolution) RTC as scheduled

## 2011-08-22 ENCOUNTER — Encounter: Payer: Self-pay | Admitting: Family Medicine

## 2011-08-24 ENCOUNTER — Ambulatory Visit (INDEPENDENT_AMBULATORY_CARE_PROVIDER_SITE_OTHER): Payer: Medicare Other | Admitting: Family Medicine

## 2011-08-24 ENCOUNTER — Telehealth: Payer: Self-pay

## 2011-08-24 ENCOUNTER — Encounter: Payer: Self-pay | Admitting: Family Medicine

## 2011-08-24 VITALS — BP 124/66 | HR 86 | Temp 97.7°F | Resp 17 | Ht 62.0 in | Wt 120.0 lb

## 2011-08-24 DIAGNOSIS — S8010XA Contusion of unspecified lower leg, initial encounter: Secondary | ICD-10-CM | POA: Diagnosis not present

## 2011-08-24 NOTE — Patient Instructions (Signed)
Return if the place opens up.

## 2011-08-24 NOTE — Progress Notes (Signed)
Subjective: Patient started oozing a little bit of blood from that hematoma on her shin and it concerned her so she came back in here. Waiting an hour and a half in the lobby she says her ankle and foot started swelling more she has been being followed for this for several weeks.  Objective: Hematoma right shin, clean, noninfected. It looks like the skin is going to use slough a little more soon and probably open the cavity into the hematoma. There is a small to moderate amount of edema of the foot.  Assessment: Hematoma right shin  Plan: Reassurance. Keep it clean and dry with a stick redraws on it. If it opens up and they clotted blood starts draining out of there she should come on back as we may need to try and express the hematoma then. I did not choose to open, though it looks like it is close to the articular as long as it is intact and not infected it was left along.

## 2011-08-26 ENCOUNTER — Ambulatory Visit (INDEPENDENT_AMBULATORY_CARE_PROVIDER_SITE_OTHER): Payer: Medicare Other | Admitting: Family Medicine

## 2011-08-26 ENCOUNTER — Telehealth: Payer: Self-pay

## 2011-08-26 VITALS — BP 106/62 | HR 62 | Temp 97.7°F | Resp 18 | Ht 62.0 in | Wt 121.0 lb

## 2011-08-26 DIAGNOSIS — L97909 Non-pressure chronic ulcer of unspecified part of unspecified lower leg with unspecified severity: Secondary | ICD-10-CM | POA: Diagnosis not present

## 2011-08-26 DIAGNOSIS — S8010XA Contusion of unspecified lower leg, initial encounter: Secondary | ICD-10-CM

## 2011-08-26 DIAGNOSIS — S81009A Unspecified open wound, unspecified knee, initial encounter: Secondary | ICD-10-CM | POA: Diagnosis not present

## 2011-08-26 DIAGNOSIS — S81809A Unspecified open wound, unspecified lower leg, initial encounter: Secondary | ICD-10-CM | POA: Diagnosis not present

## 2011-08-26 NOTE — Telephone Encounter (Signed)
I have advised patient if the blood is clotted or looks old she needs to come back, she is having increased swelling of her leg, I have told her to come in this afternoon to see Dr Alwyn Ren.

## 2011-08-26 NOTE — Progress Notes (Signed)
Patient is here for recheck of her leg which is draining more.  Objective: The wounds it looks like the skin is ready to peel-off and therefore black bloody drainage.  Wound was debrided. The surface skin was peeled off. Underneath there was a scar approximately 2.5 x 1.5 cm. This was debrided away. Underneath that was a large hematoma which is organized. It was pulled out gradually but was very jellylike and didn't want to come out. Irrigation and pressure irrigation was used to clean it on out. I had to numb it for a while with topical lidocaine, which helped considerably. The wound was then packed with wet to dry 1/2 inch gauze. She tolerated the procedure well.  Assessment: Leg ulceration and hematoma Debridement of ulceration  Plan: Return tomorrow for recheck.

## 2011-08-26 NOTE — Telephone Encounter (Signed)
The patient called to request return call concerning her leg hematoma.  The patient stated that the leg is bleeding and she is concerned.  Please call the patient at [phone].

## 2011-08-26 NOTE — Patient Instructions (Signed)
We will keep the wound dressed with wet to dry dressings for a few days.  Return in the a.m. to see Benny Lennert PA  [place] PA is on duty on Saturday and will be here all day Saturday and Sunday.  When your sitting around try to keep the leg elevated.  Tylenol for pain.

## 2011-08-27 ENCOUNTER — Ambulatory Visit (INDEPENDENT_AMBULATORY_CARE_PROVIDER_SITE_OTHER): Payer: Medicare Other | Admitting: Physician Assistant

## 2011-08-27 VITALS — BP 108/64 | HR 72 | Temp 98.1°F | Resp 18 | Ht 62.0 in | Wt 121.0 lb

## 2011-08-27 DIAGNOSIS — S8010XA Contusion of unspecified lower leg, initial encounter: Secondary | ICD-10-CM

## 2011-08-27 DIAGNOSIS — S91009A Unspecified open wound, unspecified ankle, initial encounter: Secondary | ICD-10-CM | POA: Diagnosis not present

## 2011-08-27 DIAGNOSIS — S81809A Unspecified open wound, unspecified lower leg, initial encounter: Secondary | ICD-10-CM

## 2011-08-27 DIAGNOSIS — S81009A Unspecified open wound, unspecified knee, initial encounter: Secondary | ICD-10-CM | POA: Diagnosis not present

## 2011-08-27 DIAGNOSIS — S81802A Unspecified open wound, left lower leg, initial encounter: Secondary | ICD-10-CM

## 2011-08-27 DIAGNOSIS — M79605 Pain in left leg: Secondary | ICD-10-CM

## 2011-08-27 DIAGNOSIS — M79609 Pain in unspecified limb: Secondary | ICD-10-CM | POA: Diagnosis not present

## 2011-08-27 NOTE — Progress Notes (Signed)
  Subjective:    Patient ID: Vanessa [name], female    DOB: [birth year], 78 y.o.   MRN: [id number]  HPI  Pt presents to clinic for wound recheck.  She bled through her drsg last pm.  She is still having pain in her leg.  She did try to keep her leg elevated today.  She came in with her neighbor who is a CMA who has worked in wound care to hopefully be able to help her with drsg changes.  Review of Systems     Objective:   Physical Exam  Constitutional: She is oriented to person, place, and time. She appears well-developed and well-nourished.  HENT:  Head: Normocephalic and atraumatic.  Right Ear: External ear normal.  Left Ear: External ear normal.  Pulmonary/Chest: Effort normal.  Neurological: She is alert and oriented to person, place, and time.  Skin: Skin is warm and dry.       Tegaderm drsg removed.  On L leg she has a  3-4 cm wound from a hematoma that was debrided yesterday.  There is small amount of thick yellow eschar on edges that is removed.  Wound is irrigated with 2% lido and then hematoma is debrided some more.  A wet to dry drsg placed.  Psychiatric: She has a normal mood and affect. Her behavior is normal. Judgment and thought content normal.       Assessment & Plan:   1. Traumatic hematoma of lower leg   2. Leg pain, left   3. Wound of left leg    The drsg I removed was not an affective wet to dry drsg due to tegaderm.  Pt should do wet to dry drsg q12h if possible to keep the dead tissue removed and allow the new granulation tissue to form.  Showed CMA with her how to do the drsg and when to do them.  Pt to either do these drsg at home tomorrow or rtc for [place] to do.  Pt should definitely RTC Monday to recheck with Dr. Alwyn Ren.

## 2011-08-28 ENCOUNTER — Ambulatory Visit (INDEPENDENT_AMBULATORY_CARE_PROVIDER_SITE_OTHER): Payer: Medicare Other | Admitting: Physician Assistant

## 2011-08-28 VITALS — BP 122/59 | HR 78 | Temp 97.4°F | Resp 18

## 2011-08-28 DIAGNOSIS — T148XXA Other injury of unspecified body region, initial encounter: Secondary | ICD-10-CM

## 2011-08-28 NOTE — Progress Notes (Signed)
  Subjective:    Patient ID: Vanessa [name], female    DOB: 1920/04/24, 78 y.o.   MRN: [id number]  HPI here for wound care.  See last OVs. She is having less pain and is doing well.  "Dois Davenport," a neighbor CMA is here with her and she is doing the dressings at home for the patient q 12hrs.    Review of Systems  All other systems reviewed and are negative.       Objective:   Physical Exam  Nursing note and vitals reviewed. Skin:       Wet to dry dressing removed and was effective in exposing viable tissue.  Granulation tissue appearing healthy lining the opening.  No black skin/tissue.  New wet to dry placed and ACE wrap on top to avoid tape.       Assessment & Plan:  Traumatic Hematoma-improving.  Continue with aggressive elevation.  Wet to dry dressings every 12 hrs and see Dr. Alwyn Ren after 2 pm [facility] sooner if needed.

## 2011-08-30 ENCOUNTER — Ambulatory Visit (INDEPENDENT_AMBULATORY_CARE_PROVIDER_SITE_OTHER): Payer: Medicare Other | Admitting: Family Medicine

## 2011-08-30 VITALS — BP 124/64 | HR 70 | Temp 98.1°F | Resp 16 | Ht 61.75 in | Wt 122.8 lb

## 2011-08-30 DIAGNOSIS — L97909 Non-pressure chronic ulcer of unspecified part of unspecified lower leg with unspecified severity: Secondary | ICD-10-CM | POA: Insufficient documentation

## 2011-08-30 DIAGNOSIS — S8010XA Contusion of unspecified lower leg, initial encounter: Secondary | ICD-10-CM | POA: Diagnosis not present

## 2011-08-30 DIAGNOSIS — S81009A Unspecified open wound, unspecified knee, initial encounter: Secondary | ICD-10-CM

## 2011-08-30 DIAGNOSIS — S81809A Unspecified open wound, unspecified lower leg, initial encounter: Secondary | ICD-10-CM

## 2011-08-30 DIAGNOSIS — S91009A Unspecified open wound, unspecified ankle, initial encounter: Secondary | ICD-10-CM | POA: Diagnosis not present

## 2011-08-30 NOTE — Progress Notes (Signed)
Subjective: Patient is here for recheck of the wound of her right leg. She has been changing the dressing every day.  Objective: Ulcerated area right shin is a clean. There is a little yellow off discharge in the base of it. This would not irrigate out. Areas purplish discoloration of the skin surrounding the wound but no cellulitis appearance.  Assessment: Wound right leg, improving  Plan: Return in one week. Continue same treatment at home.

## 2011-08-30 NOTE — Patient Instructions (Signed)
Return in one week. Continue same care. Come sooner if problems arise.

## 2011-09-01 ENCOUNTER — Telehealth: Payer: Self-pay

## 2011-09-01 NOTE — Telephone Encounter (Signed)
For Dr. Audria Nine: patient has appt this Friday 8/23, but has been seeing Dr. Alwyn Ren at [facility] for leg injury (is on Coumadin). Saw him last week and has fast track card to see him next week. He ordered CNA to come to her home for wound care and that is going well. She wants to know if she should still come in Friday for appt (was only for leg wound), or should she just skip it since she is getting home treatment and care from Dr. Alwyn Ren? Please call [phone]

## 2011-09-01 NOTE — Telephone Encounter (Signed)
I have called her to advise and have had appt scheduling cancel this

## 2011-09-01 NOTE — Telephone Encounter (Signed)
Since pt has seen Dr. Alwyn Ren at [address] and she has a Fast Track appt with him, it is okay to cancel the follow-up with me. I hope her leg is getting better.

## 2011-09-01 NOTE — Telephone Encounter (Signed)
I called patient she has two appts for a recheck on her leg. One is with Dr Audria Nine the other with Dr Alwyn Ren, she advised she prefers to see Dr Alwyn Ren for the leg since he has seen her most recently. She asked if there is anything else she needs to be seen for with Dr Audria Nine, I told her I will find out. It looks like the only reason she is due for follow up is the leg, is it okay for her to cancel the appt with you? (her cardiologist manages her Coumadin).

## 2011-09-03 ENCOUNTER — Ambulatory Visit: Payer: Medicare Other | Admitting: Family Medicine

## 2011-09-08 ENCOUNTER — Ambulatory Visit (INDEPENDENT_AMBULATORY_CARE_PROVIDER_SITE_OTHER): Payer: Medicare Other | Admitting: *Deleted

## 2011-09-08 DIAGNOSIS — I4891 Unspecified atrial fibrillation: Secondary | ICD-10-CM

## 2011-09-08 LAB — POCT INR: INR: 3.2

## 2011-09-09 ENCOUNTER — Ambulatory Visit (INDEPENDENT_AMBULATORY_CARE_PROVIDER_SITE_OTHER): Payer: Medicare Other | Admitting: Family Medicine

## 2011-09-09 VITALS — BP 138/76 | HR 77 | Temp 98.1°F | Resp 16 | Ht 61.5 in | Wt 122.6 lb

## 2011-09-09 DIAGNOSIS — L97309 Non-pressure chronic ulcer of unspecified ankle with unspecified severity: Secondary | ICD-10-CM | POA: Diagnosis not present

## 2011-09-09 DIAGNOSIS — M25579 Pain in unspecified ankle and joints of unspecified foot: Secondary | ICD-10-CM | POA: Diagnosis not present

## 2011-09-09 NOTE — Progress Notes (Signed)
Subjective: Patient is here for recheck of the ulcer ankle. He continues to hurt quite a lot. It is not draining. Her friend is is it every day for her. She is a Chief Strategy Officer, and would like to use some Silvadene on it. I think it's reasonable.  Objective: One now measures 2.4 x 1.5 cm. Very tender. Only a little yellowish debris in the base of it. Good granulation tissue. Surrounding tissues looking better.  Assessment: Leg ulcer and pain of ankle  Plan: Same treatment. See her back in 2 weeks.

## 2011-09-23 ENCOUNTER — Ambulatory Visit (INDEPENDENT_AMBULATORY_CARE_PROVIDER_SITE_OTHER): Payer: Medicare Other | Admitting: Family Medicine

## 2011-09-23 VITALS — BP 97/63 | HR 87 | Temp 97.7°F | Resp 16

## 2011-09-23 DIAGNOSIS — L97909 Non-pressure chronic ulcer of unspecified part of unspecified lower leg with unspecified severity: Secondary | ICD-10-CM

## 2011-09-23 NOTE — Progress Notes (Signed)
Subjective: Patient is here for recheck with regard to her leg ulcer  Objective: Ulcer now measures 1.0 cm x 2.0 cm. The proximal portion still has a deeper cavity, but overall he continues to heal in.  Assessment: Leg ulcer, much improved  Plan: Change dressing every other day. Return in 4 weeks for recheck.

## 2011-09-23 NOTE — Patient Instructions (Addendum)
Change dressing every other day. Return in 4 weeks.

## 2011-09-29 ENCOUNTER — Ambulatory Visit (INDEPENDENT_AMBULATORY_CARE_PROVIDER_SITE_OTHER): Payer: Medicare Other | Admitting: *Deleted

## 2011-09-29 DIAGNOSIS — I4891 Unspecified atrial fibrillation: Secondary | ICD-10-CM

## 2011-09-29 LAB — POCT INR: INR: 2.5

## 2011-10-06 ENCOUNTER — Encounter: Payer: Self-pay | Admitting: Cardiology

## 2011-10-06 ENCOUNTER — Other Ambulatory Visit (INDEPENDENT_AMBULATORY_CARE_PROVIDER_SITE_OTHER): Payer: Medicare Other

## 2011-10-06 ENCOUNTER — Ambulatory Visit (INDEPENDENT_AMBULATORY_CARE_PROVIDER_SITE_OTHER): Payer: Medicare Other | Admitting: Cardiology

## 2011-10-06 ENCOUNTER — Ambulatory Visit: Payer: Medicare Other | Admitting: Cardiology

## 2011-10-06 VITALS — BP 148/66 | HR 62 | Ht 61.5 in | Wt 121.8 lb

## 2011-10-06 DIAGNOSIS — I6529 Occlusion and stenosis of unspecified carotid artery: Secondary | ICD-10-CM

## 2011-10-06 DIAGNOSIS — R5381 Other malaise: Secondary | ICD-10-CM | POA: Insufficient documentation

## 2011-10-06 DIAGNOSIS — E7800 Pure hypercholesterolemia, unspecified: Secondary | ICD-10-CM

## 2011-10-06 DIAGNOSIS — I4891 Unspecified atrial fibrillation: Secondary | ICD-10-CM

## 2011-10-06 DIAGNOSIS — R5383 Other fatigue: Secondary | ICD-10-CM

## 2011-10-06 DIAGNOSIS — I4892 Unspecified atrial flutter: Secondary | ICD-10-CM

## 2011-10-06 LAB — LIPID PANEL
Cholesterol: 185 mg/dL (ref 0–200)
HDL: 70.3 mg/dL (ref >39.00)
LDL Cholesterol: 95 mg/dL (ref 0–99)
Total CHOL/HDL Ratio: 3
Triglycerides: 97 mg/dL (ref 0.0–149.0)
VLDL: 19.4 mg/dL (ref 0.0–40.0)

## 2011-10-06 LAB — HEPATIC FUNCTION PANEL
ALT: 17 U/L (ref 0–35)
AST: 33 U/L (ref 0–37)
Albumin: 3.8 g/dL (ref 3.5–5.2)
Alkaline Phosphatase: 87 U/L (ref 39–117)
Bilirubin, Direct: 0.1 mg/dL (ref 0.0–0.3)
Total Bilirubin: 0.8 mg/dL (ref 0.3–1.2)
Total Protein: 7.3 g/dL (ref 6.0–8.3)

## 2011-10-06 LAB — CBC WITH DIFFERENTIAL/PLATELET
Basophils Absolute: 0.1 10*3/uL (ref 0.0–0.1)
Basophils Relative: 0.6 % (ref 0.0–3.0)
Eosinophils Absolute: 0.1 10*3/uL (ref 0.0–0.7)
Eosinophils Relative: 1.3 % (ref 0.0–5.0)
HCT: 41.9 % (ref 36.0–46.0)
Hemoglobin: 13.7 g/dL (ref 12.0–15.0)
Lymphocytes Relative: 16.4 % (ref 12.0–46.0)
Lymphs Abs: 1.3 10*3/uL (ref 0.7–4.0)
MCHC: 32.7 g/dL (ref 30.0–36.0)
MCV: 99.5 fl (ref 78.0–100.0)
Monocytes Absolute: 0.8 10*3/uL (ref 0.1–1.0)
Monocytes Relative: 9.5 % (ref 3.0–12.0)
Neutro Abs: 5.7 10*3/uL (ref 1.4–7.7)
Neutrophils Relative %: 72.2 % (ref 43.0–77.0)
Platelets: 247 10*3/uL (ref 150.0–400.0)
RBC: 4.21 Mil/uL (ref 3.87–5.11)
RDW: 14.4 % (ref 11.5–14.6)
WBC: 7.9 10*3/uL (ref 4.5–10.5)

## 2011-10-06 LAB — BASIC METABOLIC PANEL
BUN: 13 mg/dL (ref 6–23)
CO2: 29 mEq/L (ref 19–32)
Calcium: 9.5 mg/dL (ref 8.4–10.5)
Chloride: 102 mEq/L (ref 96–112)
Creatinine, Ser: 0.9 mg/dL (ref 0.4–1.2)
GFR: 62.56 mL/min (ref >60.00)
Glucose, Bld: 89 mg/dL (ref 70–99)
Potassium: 4.8 mEq/L (ref 3.5–5.1)
Sodium: 140 mEq/L (ref 135–145)

## 2011-10-06 NOTE — Assessment & Plan Note (Signed)
Patient remains in atrial flutter fibrillation with a controlled ventricular response.  She has not been experiencing any TIA symptoms.  She has not been on any recent chest pain or angina

## 2011-10-06 NOTE — Assessment & Plan Note (Signed)
The patient has been feeling more fatigued.  She is tired in the morning but usually feels better in the afternoons.  She tends to sleep late in the morning.  We are checking lab work today

## 2011-10-06 NOTE — Progress Notes (Signed)
Vanessa [name] Date of Birth:  07-Jun-1920 [facility] [address] [place], [place]  [address] [phone]         Fax   [phone]  History of Present Illness: This pleasant 78 year old widowed woman is seen for a scheduled four-month followup office visit. She has a history of atypical chest pain. He was seen in March 2013 with these symptoms and underwent a nuclear stress test subsequently which showed no ischemia. The patient reports that her symptoms have improved. She recently saw Dr. Kirstie Peri who placed her on Nexium for a rotation problems and dysphagia. She reports improvement in those symptoms since starting on Nexium. She has had a history of some falls and some scrapes. She did injure her right forearm recently and had to have several sutures. She is on long-term Coumadin because of her chronic atrial flutter fibrillation she has a history of vascular disease with known chronic occlusion of her left carotid artery. She also has a past history of high blood pressure. She has had a history of osteoporosis and osteoarthritis and is followed by Dr. Darrelyn Hillock.  Since we last saw her she fell about 13 weeks ago and injured her left lower leg and has been under the care of her primary care physician Dr. Janace Hoard.  A home health CNA has been coming out to her house every other day to dress the wound and it is improving.   Current Outpatient Prescriptions  Medication Sig Dispense Refill  . ALPRAZolam (XANAX) 0.25 MG tablet Take 1 tablet (0.25 mg total) by mouth daily as needed.  30 tablet  3  . aspirin 81 MG tablet Take 1 tablet (81 mg total) by mouth every Monday, Wednesday, and Friday.      Marland Kitchen atenolol (TENORMIN) 25 MG tablet Take 1 tablet (25 mg total) by mouth 2 (two) times daily.  60 tablet  7  . BIOTIN 5000 PO Take by mouth daily.      . Calcium-Vitamin D-Vitamin K (VIACTIV PO) Take by mouth.      . hydrocortisone cream 1 % Apply topically 2 (two) times daily.       . hydrOXYzine (ATARAX) 10 MG tablet Take 10 mg by mouth 2 (two) times daily as needed.       . Multiple Vitamin (MULTIVITAMIN) tablet Take 1 tablet by mouth daily.        . simvastatin (ZOCOR) 40 MG tablet TAKE 1/2 TABLET EVERY DAY  30 tablet  5  . triamcinolone (KENALOG) 0.1 % cream Apply topically as needed.        . warfarin (COUMADIN) 2 MG tablet TAKE 1 TABLET EVERY DAY 5 DAYS PER WEEK AND 2 TABLETS DAILY 2 DAYS PER WEEK OR AS DIRECTED  90 tablet  5  . sucralfate (CARAFATE) 1 GM/10ML suspension Take medication 10 cc 3 times a day and at bedtime on an empty stomach  420 mL  0    Allergies  Allergen Reactions  . Tape Other (See Comments)    Tears skin.    Patient Active Problem List  Diagnosis  . Atrial fibrillation  . Neuropathy  . Carotid artery occlusion  . Hypercholesterolemia  . Scoliosis  . Hoarseness  . Headache  . Dizziness  . Abdominal bloating  . Bruises easily  . Pruritus  . Raynaud phenomenon  . Osteoarthritis  . Chest pain  . Leg ulcer  . Pain, ankle  . Ankle ulcer    History  Smoking status  .  Never Smoker   Smokeless tobacco  . Not on file    History  Alcohol Use No    Family History  Problem Relation Age of Onset  . Heart disease      Review of Systems: Constitutional: no fever chills diaphoresis or fatigue or change in weight.  Head and neck: no hearing loss, no epistaxis, no photophobia or visual disturbance. Respiratory: No cough, shortness of breath or wheezing. Cardiovascular: No chest pain peripheral edema, palpitations. Gastrointestinal: No abdominal distention, no abdominal pain, no change in bowel habits hematochezia or melena. Genitourinary: No dysuria, no frequency, no urgency, no nocturia. Musculoskeletal:No arthralgias, no back pain, no gait disturbance or myalgias. Neurological: No dizziness, no headaches, no numbness, no seizures, no syncope, no weakness, no tremors. Hematologic: No lymphadenopathy, no easy  bruising. Psychiatric: No confusion, no hallucinations, no sleep disturbance.    Physical Exam: Filed Vitals:   10/06/11 0900  BP: 148/66  Pulse: 62   the general appearance reveals a well-developed alert woman in no acute distress.The head and neck exam reveals pupils equal and reactive.  Extraocular movements are full.  There is no scleral icterus.  The mouth and pharynx are normal.  The neck is supple.  The carotids reveal no bruits.  The jugular venous pressure is normal.  The  thyroid is not enlarged.  There is no lymphadenopathy.  The chest is clear to percussion and auscultation.  There are no rales or rhonchi.  Expansion of the chest is symmetrical.  The precordium is quiet.  The rhythm is irregular The first heart sound is normal.  The second heart sound is physiologically split.  There is no murmur gallop rub or click.  There is no abnormal lift or heave.  The abdomen is soft and nontender.  The bowel sounds are normal.  The liver and spleen are not enlarged.  There are no abdominal masses.  There are no abdominal bruits.  Extremities reveal good pedal pulses.  There is no phlebitis or edema.  There is no cyanosis or clubbing.  Strength is normal and symmetrical in all extremities.  There is no lateralizing weakness.  There are no sensory deficits.  The skin is warm and dry.  There is no rash.     Assessment / Plan: Continue same medication.  She continues to have problems with a weak voice and plans to see her ENT physician Dr. Haroldine Laws again.  She states that the Zyrtec has not helped her and in may be contributing to some of her lethargy and she will stop the Zyrtec at this time.  She also remains on hydroxyzine twice a day from her dermatologist to treat pruritus.  She continues under the care of Dr. Matthias Hughs regarding her swallowing difficulties.  She finds that if she takes her pills with applesauce she does better. She will recheck here in 4 months for followup office visit lipid  panel hepatic function panel basal metabolic panel and TSH

## 2011-10-06 NOTE — Patient Instructions (Addendum)
Stop the Cetirizine (zyrtec)  Your physician wants you to follow-up in: 4 months with fasting labs (lp/bmet/hfp/tsh)  You will receive a reminder letter in the mail two months in advance. If you don't receive a letter, please call our office to schedule the follow-up appointment.

## 2011-10-06 NOTE — Assessment & Plan Note (Signed)
The patient has a past history of a carotid artery total occlusion of her left carotid artery.  She has not had any new symptoms referable to that.

## 2011-10-07 NOTE — Progress Notes (Signed)
Quick Note:  Please report to patient. The recent labs are stable. Continue same medication and careful diet. ______ 

## 2011-10-11 ENCOUNTER — Telehealth: Payer: Self-pay | Admitting: *Deleted

## 2011-10-11 NOTE — Telephone Encounter (Signed)
Advised patient of lab results  

## 2011-10-11 NOTE — Telephone Encounter (Signed)
Message copied by Burnell Blanks on Mon Oct 11, 2011  2:38 PM ------      Message from: Cassell Clement      Created: Thu Oct 07, 2011  7:13 PM       Please report to patient.  The recent labs are stable. Continue same medication and careful diet.

## 2011-10-20 ENCOUNTER — Ambulatory Visit (INDEPENDENT_AMBULATORY_CARE_PROVIDER_SITE_OTHER): Payer: Medicare Other | Admitting: Family Medicine

## 2011-10-20 VITALS — BP 114/80 | HR 82 | Temp 97.2°F | Resp 18 | Ht 62.0 in | Wt 120.0 lb

## 2011-10-20 DIAGNOSIS — Z23 Encounter for immunization: Secondary | ICD-10-CM | POA: Diagnosis not present

## 2011-10-20 DIAGNOSIS — L97909 Non-pressure chronic ulcer of unspecified part of unspecified lower leg with unspecified severity: Secondary | ICD-10-CM | POA: Diagnosis not present

## 2011-10-20 NOTE — Progress Notes (Signed)
Subjective: Patient is here for recheck of the ankle. It is continued to improve. She's been using a gel dressing on it.  Objective: The wound is almost completely healed. There is still is some scar that needs to firm up, however is not erythematous any longer. Just lateral to the wound is a varicosity that keeps a little swelling on the age of it, but I do not think this is from the wound or in the tissue infection in there.  Assessment: Abscess left shin, much improved  Plan: Return when necessary

## 2011-10-20 NOTE — Patient Instructions (Signed)
No special care he is needed at this time. Return if problems arise.

## 2011-10-27 ENCOUNTER — Ambulatory Visit (INDEPENDENT_AMBULATORY_CARE_PROVIDER_SITE_OTHER): Payer: Medicare Other | Admitting: *Deleted

## 2011-10-27 ENCOUNTER — Ambulatory Visit (INDEPENDENT_AMBULATORY_CARE_PROVIDER_SITE_OTHER): Payer: Medicare Other | Admitting: Family Medicine

## 2011-10-27 VITALS — BP 104/64 | HR 80 | Temp 98.0°F | Resp 16 | Ht 62.0 in | Wt 120.0 lb

## 2011-10-27 DIAGNOSIS — I4891 Unspecified atrial fibrillation: Secondary | ICD-10-CM

## 2011-10-27 DIAGNOSIS — R5381 Other malaise: Secondary | ICD-10-CM | POA: Diagnosis not present

## 2011-10-27 DIAGNOSIS — R5383 Other fatigue: Secondary | ICD-10-CM

## 2011-10-27 LAB — POCT INR: INR: 2.5

## 2011-10-28 ENCOUNTER — Encounter: Payer: Self-pay | Admitting: Family Medicine

## 2011-10-28 NOTE — Progress Notes (Signed)
S:  This pleasant 78 y.o. Cauc female is here to follow-up re: HTN and medications and chronic left anterior shin wound. She is doing every- other -day home care and she states the wound is slowly healing. She has no new wounds (these skin tears occur when pt has a minor accident or fall and skin is so fragile). She is more careful now as she ambulates. She is still driving w/o incident. Self-care is very good. Appetite is good. She st  ROS: Noncontributory; she denies diaphoresis, CP or tightness, palpitations, dizziness, numbness, weakness or syncope.   O: Filed Vitals:   10/27/11 1129  BP: 104/64                                      Weight is stable  Pulse: 80  Temp: 98 F (36.7 C)  Resp: 16   GEN: In NAD; WN,WD. HENT: [place]/AT; otherwise unremarkable. COR: RRR; no edema. LUNGS: Unlabored. SKIN: W&D; Thin and multiple areas of discoloration. Left lower leg wound is covered. NEURO: A&O x 3; CNs intact. Otherwise nonfocal.  A/P: 1. Fatigue      Reassurance; reviewed all labs from Sept 2013.  Encouraged to continue good self-care. RTC prn.

## 2011-11-02 DIAGNOSIS — J0100 Acute maxillary sinusitis, unspecified: Secondary | ICD-10-CM | POA: Diagnosis not present

## 2011-11-20 ENCOUNTER — Ambulatory Visit: Payer: Medicare Other

## 2011-11-23 ENCOUNTER — Other Ambulatory Visit: Payer: Self-pay | Admitting: *Deleted

## 2011-11-23 MED ORDER — ATENOLOL 25 MG PO TABS: 25.0000 mg | ORAL_TABLET | Freq: Two times a day (BID) | ORAL | Status: DC

## 2011-11-24 ENCOUNTER — Ambulatory Visit (INDEPENDENT_AMBULATORY_CARE_PROVIDER_SITE_OTHER): Payer: Medicare Other | Admitting: *Deleted

## 2011-11-24 ENCOUNTER — Ambulatory Visit (INDEPENDENT_AMBULATORY_CARE_PROVIDER_SITE_OTHER): Payer: Medicare Other | Admitting: Family Medicine

## 2011-11-24 ENCOUNTER — Other Ambulatory Visit: Payer: Self-pay | Admitting: Family Medicine

## 2011-11-24 ENCOUNTER — Telehealth: Payer: Self-pay

## 2011-11-24 VITALS — BP 124/74 | HR 80 | Temp 98.4°F | Resp 18 | Wt 122.0 lb

## 2011-11-24 DIAGNOSIS — I8390 Asymptomatic varicose veins of unspecified lower extremity: Secondary | ICD-10-CM

## 2011-11-24 DIAGNOSIS — I4891 Unspecified atrial fibrillation: Secondary | ICD-10-CM

## 2011-11-24 DIAGNOSIS — I83009 Varicose veins of unspecified lower extremity with ulcer of unspecified site: Secondary | ICD-10-CM

## 2011-11-24 LAB — POCT INR: INR: 2.5

## 2011-11-24 NOTE — Progress Notes (Signed)
78 yo woman with recent 3 month recovery from hematoma right lower shin.  She is noting swelling coming on without further bleeding or ecchymosis.  Objective:  NAD Varicose vein identified.  Nontender.  Hemosiderosis surrounds the previous wound which has healed.  Assessment:  Varicose vein threatening further problems  Plan:  C[place]an dressing Patient to call Dr. Durwin Nora, her circulation doctor.

## 2011-11-24 NOTE — Telephone Encounter (Signed)
Pt states she is unable to make an appointment with a vascular dr, Dr Durwin Nora, herself. She is requesting Dr Milus Glazier to make the referral on her behalf. States dr Dixon's number is [phone]

## 2011-11-24 NOTE — Telephone Encounter (Signed)
Dr L, is it OK to refer pt to Dr Durwin Nora?

## 2011-11-24 NOTE — Patient Instructions (Addendum)
Keep leg bandaged during the day and remove bandage at night

## 2011-11-25 NOTE — Addendum Note (Signed)
Addended byCaffie Damme on: 11/25/2011 01:17 PM   Modules accepted: Orders

## 2011-12-03 ENCOUNTER — Other Ambulatory Visit: Payer: Self-pay | Admitting: *Deleted

## 2011-12-03 DIAGNOSIS — I83009 Varicose veins of unspecified lower extremity with ulcer of unspecified site: Secondary | ICD-10-CM

## 2011-12-04 ENCOUNTER — Ambulatory Visit (INDEPENDENT_AMBULATORY_CARE_PROVIDER_SITE_OTHER): Payer: Medicare Other | Admitting: Family Medicine

## 2011-12-04 VITALS — BP 118/78 | HR 80 | Temp 97.8°F | Resp 16 | Ht 62.0 in | Wt 122.0 lb

## 2011-12-04 DIAGNOSIS — I8390 Asymptomatic varicose veins of unspecified lower extremity: Secondary | ICD-10-CM | POA: Diagnosis not present

## 2011-12-04 NOTE — Patient Instructions (Signed)
Keep your appointment with the other Dr. for his opinion.

## 2011-12-04 NOTE — Progress Notes (Signed)
Subjective: Patient is here for me to recheck that place on her left leg. The ulcer finally healed up completely. She saw Dr. Faustino Congress for a vein that it popped up just adjacent to where the wound had been. He referred to a vascular doctor, but she cannot get the appointment for them until 2 weeks from now. She wanted it looked at again to make sure it was doing okay.  Objective: Leg ulcer is healed Small varicosity is prominent just adjacent to the wound on the lateral aspect of the wound. She's been wearing a wrap on it.  Assessment: Varicose vein, new onset, and the patient has numerous other varicosities  Plan: I do not feel like anything urgent is necessary at this time. My opinion is that just cautious waiting and watching of this is all that she needs. Gave her an Ace wrap to use on it.

## 2011-12-21 ENCOUNTER — Encounter: Payer: Self-pay | Admitting: Vascular Surgery

## 2011-12-22 ENCOUNTER — Encounter (INDEPENDENT_AMBULATORY_CARE_PROVIDER_SITE_OTHER): Payer: Medicare Other | Admitting: *Deleted

## 2011-12-22 ENCOUNTER — Encounter: Payer: Self-pay | Admitting: Vascular Surgery

## 2011-12-22 ENCOUNTER — Ambulatory Visit (INDEPENDENT_AMBULATORY_CARE_PROVIDER_SITE_OTHER): Payer: Medicare Other | Admitting: Vascular Surgery

## 2011-12-22 VITALS — BP 128/61 | HR 77 | Ht 62.0 in | Wt 120.5 lb

## 2011-12-22 DIAGNOSIS — I83009 Varicose veins of unspecified lower extremity with ulcer of unspecified site: Secondary | ICD-10-CM

## 2011-12-22 DIAGNOSIS — I83893 Varicose veins of bilateral lower extremities with other complications: Secondary | ICD-10-CM | POA: Diagnosis not present

## 2011-12-22 DIAGNOSIS — I8390 Asymptomatic varicose veins of unspecified lower extremity: Secondary | ICD-10-CM | POA: Insufficient documentation

## 2011-12-22 DIAGNOSIS — J383 Other diseases of vocal cords: Secondary | ICD-10-CM | POA: Diagnosis not present

## 2011-12-22 NOTE — Progress Notes (Signed)
Vascular and Vein Specialist of [facility]  Patient name: Vanessa [name] MRN: [id number] DOB: [birth year] Sex: female  REASON FOR CONSULT: bilateral lower extremity varicose veins  HPI: Vanessa [name] is a 78 y.o. female who has had varicose veins for many years. She fell earlier in the year and developed a hematoma on her left pretibial area with a small wound. This wound took approximately 4 months to heal but ultimately healed. She's had some discoloration around this area with an adjacent varicose vein was sent for vascular consultation. She states that her varicose veins really do not cause her any significant symptoms. She denies aching pain heaviness or significant swelling. She is unable to wear compression stockings because she cannot bend over to put them on. She does elevate her legs intermittently. She has never had any bleeding problems from her varicose veins.  She does state that she had a carotid duplex scan elsewhere and brought that report to our office. I am unable to find that currently but she states that it showed that carotid was occluded and there was no significant problem on the opposite side. She denies any history of stroke, TIAs, expressive or receptive aphasia, or amaurosis fugax.  Past Medical History  Diagnosis Date  . Atrial fibrillation   . Neuropathy     PERIPHERAL  . Carotid artery occlusion     LEFT  . Scoliosis   . Hoarseness   . Headache   . Dizziness   . Abdominal bloating   . Bruises easily   . Pruritus   . Hypercholesterolemia     Family History  Problem Relation Age of Onset  . Heart disease    . Diabetes Mother   . Heart attack Mother   . Heart attack Father   . Cancer Sister     SOCIAL HISTORY: History  Substance Use Topics  . Smoking status: Never Smoker   . Smokeless tobacco: Never Used  . Alcohol Use: No    Allergies  Allergen Reactions  . Tape Other (See Comments)    Tears skin.    Current Outpatient Prescriptions   Medication Sig Dispense Refill  . aspirin 81 MG tablet Take 1 tablet (81 mg total) by mouth every Monday, Wednesday, and Friday.      Marland Kitchen atenolol (TENORMIN) 25 MG tablet Take 1 tablet (25 mg total) by mouth 2 (two) times daily.  60 tablet  11  . BIOTIN 5000 PO Take by mouth daily.      . Calcium-Vitamin D-Vitamin K (VIACTIV PO) Take by mouth.      . hydrOXYzine (ATARAX) 10 MG tablet Take 10 mg by mouth 2 (two) times daily as needed.       . Multiple Vitamin (MULTIVITAMIN) tablet Take 1 tablet by mouth daily.        . simvastatin (ZOCOR) 40 MG tablet TAKE 1/2 TABLET EVERY DAY  30 tablet  5  . triamcinolone (KENALOG) 0.1 % cream Apply topically as needed.        . warfarin (COUMADIN) 2 MG tablet TAKE 1 TABLET EVERY DAY 5 DAYS PER WEEK AND 2 TABLETS DAILY 2 DAYS PER WEEK OR AS DIRECTED  90 tablet  5  . ALPRAZolam (XANAX) 0.25 MG tablet Take 1 tablet (0.25 mg total) by mouth daily as needed.  30 tablet  3  . hydrocortisone cream 1 % Apply topically 2 (two) times daily.      . sucralfate (CARAFATE) 1 GM/10ML suspension Take medication 10 cc 3  times a day and at bedtime on an empty stomach  420 mL  0    REVIEW OF SYSTEMS: Arly.Keller ] denotes positive finding; [  ] denotes negative finding  CARDIOVASCULAR:  [ ]  chest pain   [ ]  chest pressure   [ ]  palpitations   [ ]  orthopnea   [ ]  dyspnea on exertion   [ ]  claudication   [ ]  rest pain   [ ]  DVT   [ ]  phlebitis PULMONARY:   [ ]  productive cough   [ ]  asthma   [ ]  wheezing NEUROLOGIC:   [ ]  weakness  [ ]  paresthesias  [ ]  aphasia  [ ]  amaurosis  [ ]  dizziness HEMATOLOGIC:   [ ]  bleeding problems   [ ]  clotting disorders MUSCULOSKELETAL:  [ ]  joint pain   [ ]  joint swelling [ ]  leg swelling GASTROINTESTINAL: [ ]   blood in stool  [ ]   hematemesis GENITOURINARY:  [ ]   dysuria  [ ]   hematuria PSYCHIATRIC:  [ ]  history of major depression INTEGUMENTARY:  [ ]  rashes  [ ]  ulcers CONSTITUTIONAL:  [ ]  fever   [ ]  chills  PHYSICAL EXAM: Filed Vitals:    12/22/11 0937  BP: 128/61  Pulse: 77  Height: 5\' 2"  (1.575 m)  Weight: 120 lb 8 oz (54.658 kg)  SpO2: 98%   Body mass index is 22.04 kg/(m^2). GENERAL: The patient is a well-nourished female, in no acute distress. The vital signs are documented above. CARDIOVASCULAR: There is a regular rate and rhythm. I do not detect carotid bruits. She has palpable femoral and dorsalis pedis pulses. PULMONARY: There is good air exchange bilaterally without wheezing or rales. ABDOMEN: Soft and non-tender with normal pitched bowel sounds.  MUSCULOSKELETAL: There are no major deformities or cyanosis. NEUROLOGIC: No focal weakness or paresthesias are detected. SKIN: There are no ulcers or rashes noted. PSYCHIATRIC: The patient has a normal affect.  DATA:  I have independently interpreted her venous duplex scan. This shows some incompetence of the proximal left greater saphenous vein and some incompetence in the deep system on the left. There is no evidence of DVT.  Our office was able to track down a carotid duplex report from August of 2013 which showed that the left carotid was occluded with no significant disease on the right side.  MEDICAL ISSUES: Varicose veins of lower extremities with other complications This patient does have bilateral varicose veins. She had a wound on her left leg which took approximately 4 months to heal but at this point it has completely healed. I do not see any evidence of active phlebitis. She does have some incompetence in her proximal left greater saphenous vein but given her age I would not recommend an aggressive approach to this. We have discussed the importance of keeping her skin well lubricated and also the importance of intermittent leg elevation. I was hoping she would wear a mild compression stocking however she states that she is unable to put on the stockings that she can't bend over that far. If she did develop bleeding problems from her varicose veins in the left  leg or a nonhealing ulcer she could be considered for laser ablation of the proximal left greater saphenous vein. She does tell me that she had a carotid duplex scan within the last year that showed 1 carotid artery was occluded with no disease on the other side. I am unable to find that study but we were are going  to track back down and then arrange for a carotid duplex scan in 1 year from that time. At that time of continue to keep an eye on her varicose veins.   DICKSON,CHRISTOPHER S [facility] [place] [facility]: [phone]

## 2011-12-22 NOTE — Assessment & Plan Note (Signed)
This patient does have bilateral varicose veins. She had a wound on her left leg which took approximately 4 months to heal but at this point it has completely healed. I do not see any evidence of active phlebitis. She does have some incompetence in her proximal left greater saphenous vein but given her age I would not recommend an aggressive approach to this. We have discussed the importance of keeping her skin well lubricated and also the importance of intermittent leg elevation. I was hoping she would wear a mild compression stocking however she states that she is unable to put on the stockings that she can't bend over that far. If she did develop bleeding problems from her varicose veins in the left leg or a nonhealing ulcer she could be considered for laser ablation of the proximal left greater saphenous vein. She does tell me that she had a carotid duplex scan within the last year that showed 1 carotid artery was occluded with no disease on the other side. I am unable to find that study but we were are going to track back down and then arrange for a carotid duplex scan in 1 year from that time. At that time of continue to keep an eye on her varicose veins.

## 2011-12-23 DIAGNOSIS — L821 Other seborrheic keratosis: Secondary | ICD-10-CM | POA: Diagnosis not present

## 2011-12-23 DIAGNOSIS — L299 Pruritus, unspecified: Secondary | ICD-10-CM | POA: Diagnosis not present

## 2011-12-23 DIAGNOSIS — Z85828 Personal history of other malignant neoplasm of skin: Secondary | ICD-10-CM | POA: Diagnosis not present

## 2011-12-23 DIAGNOSIS — L819 Disorder of pigmentation, unspecified: Secondary | ICD-10-CM | POA: Diagnosis not present

## 2011-12-23 DIAGNOSIS — D1801 Hemangioma of skin and subcutaneous tissue: Secondary | ICD-10-CM | POA: Diagnosis not present

## 2011-12-23 DIAGNOSIS — D239 Other benign neoplasm of skin, unspecified: Secondary | ICD-10-CM | POA: Diagnosis not present

## 2011-12-24 DIAGNOSIS — B351 Tinea unguium: Secondary | ICD-10-CM | POA: Diagnosis not present

## 2011-12-24 DIAGNOSIS — M19079 Primary osteoarthritis, unspecified ankle and foot: Secondary | ICD-10-CM | POA: Diagnosis not present

## 2011-12-29 ENCOUNTER — Ambulatory Visit (HOSPITAL_COMMUNITY)
Admission: RE | Admit: 2011-12-29 | Discharge: 2011-12-29 | Disposition: A | Discharge status: Home / Self Care | Payer: Medicare Other | Source: Ambulatory Visit | Attending: Otolaryngology | Admitting: Otolaryngology

## 2011-12-29 DIAGNOSIS — R1319 Other dysphagia: Secondary | ICD-10-CM | POA: Insufficient documentation

## 2011-12-29 DIAGNOSIS — K225 Diverticulum of esophagus, acquired: Secondary | ICD-10-CM | POA: Diagnosis not present

## 2011-12-29 DIAGNOSIS — R1310 Dysphagia, unspecified: Secondary | ICD-10-CM

## 2011-12-29 NOTE — Procedures (Signed)
Objective Swallowing Evaluation:    Patient Details  Name: Vanessa [name] MRN: [id number] Date of Birth: 04/25/1920  Today's Date: 12/29/2011 Time:  -     Past Medical History:  Past Medical History  Diagnosis Date  . Atrial fibrillation   . Neuropathy     PERIPHERAL  . Carotid artery occlusion     LEFT  . Scoliosis   . Hoarseness   . Headache   . Dizziness   . Abdominal bloating   . Bruises easily   . Pruritus   . Hypercholesterolemia    Past Surgical History:  Past Surgical History  Procedure Date  . Abdominal hysterectomy   . Spine surgery 1997  . Gallbladder surgery    HPI:  This 78 year old female was referred by Dr. Haroldine Laws secondary to concern for aspiration.  Patient reports having changes in her voice (hoarsness) and states she has a h/o reflux/indigestion, and was evaluated several years ago by Dr. Matthias Hughs.  Patient states she has problems swallowing pills.  Other PMH: Nonspecific esophageal motility disorder; HTN;  Neuropathy, atipical chest pain, cervical spondylosiswith anterior subluxation of C3on C4, and C4 on C5     Recommendation/Prognosis  Clinical Impression Dysphagia Diagnosis: Moderate cervical esophageal phase dysphagia Clinical impression: Patient exhibits a normal orophayrngeal swallow, but a moderated cervical esophageal dysphagia, with a prominent cricopharyngeus, and a small Zinkker's diverticulm below the UES.  The pill lodged sideways near the level of the Zinker's, causing discomfort and anxiety to the patient.  Patient reports this occurs with meats and occassionally pills at home.  After 5 minutes and multiple swallows of puree, water, and nectar, the pill finally dissolved enough to pass.  Previously documented esophageal motility disorder persists.  GI f/u with Dr. Matthias Hughs may be beneficial.   Dr. Gery Pray, Radiologist, observed view of pill that was lodged, and observed patient take 3 swallows of barium after pill passed.  He agrees with  above. Swallow Evaluation Recommendations Recommended Consults: Consider GI evaluation;Consider esophageal assessment Diet Recommendations: Dysphagia 3 (Mechanical Soft);Thin liquid Liquid Administration via: Cup;Straw Medication Administration: Crushed with puree Supervision: Patient able to self feed Compensations: Slow rate;Small sips/bites;Follow solids with liquid Postural Changes and/or Swallow Maneuvers: Seated upright 90 degrees;Upright 30-60 min after meal Oral Care Recommendations: Oral care BID;Patient independent with oral care Other Recommendations: Clarify dietary restrictions Follow up Recommendations: None   Individuals Consulted Consulted and Agree with Results and Recommendations: Patient  SLP Assessment/Plan Dysphagia Diagnosis: Moderate cervical esophageal phase dysphagia Clinical impression: Patient exhibits a normal orophayrngeal swallow, but a moderated cervical esophageal dysphagia, with a prominent cricopharyngeus, and a small Zinkker's diverticulm below the UES.  The pill lodged sideways near the level of the Zinker's, causing discomfort and anxiety to the patient.  Patient reports this occurs with meats and occassionally pills at home.  After 5 minutes and multiple swallows of puree, water, and nectar, the pill finally dissolved enough to pass.  Previously documented esophageal motility disorder persists.  GI f/u with Dr. Matthias Hughs may be beneficial.   Dr. Gery Pray, Radiologist, observed view of pill that was lodged, and observed patient take 3 swallows of barium after pill passed.  He agrees with above.    General:  HPI: This 78 year old female was referred by Dr. Haroldine Laws secondary to concern for aspiration.  Patient reports having changes in her voice (hoarsness) and states she has a h/o reflux/indigestion, and was evaluated several years ago by Dr. Matthias Hughs.  Patient states she has problems swallowing  pills.  Other PMH: Nonspecific esophageal motility disorder; HTN;   Neuropathy, atipical chest pain, cervical spondylosiswith anterior subluxation of C3on C4, and C4 on C5 Previous Swallow Assessment: UGI with Dr. Matthias Hughs 07/26/2007:  Non-specific esophageal motility d/o; Small Zenker's Diverticulum' small diverticulum near the GE juncture; Probable narrowing of the distal esophagus at the GE- juntion; spondylosis. Diet Prior to this Study: Thin liquids;Dysphagia 3 (soft) (Eats several small meals per day.) Temperature Spikes Noted: No Respiratory Status: Room air History of Recent Intubation: No Behavior/Cognition: Alert;Cooperative;Pleasant mood Oral Cavity - Dentition: Adequate natural dentition Oral Motor / Sensory Function: Within functional limits Self-Feeding Abilities: Able to feed self Patient Positioning: Upright in chair Baseline Vocal Quality: Hoarse;Low vocal intensity Volitional Cough: Weak Volitional Swallow: Able to elicit Anatomy: Within functional limits Pharyngeal Secretions: Not observed secondary MBS  Reason for Referral:      Oral Phase Oral Preparation/Oral Phase Oral Phase: WFL Pharyngeal Phase  Pharyngeal Phase Pharyngeal Phase: Within functional limits Cervical Esophageal Phase  Cervical Esophageal Phase Cervical Esophageal Phase: Impaired Cervical Esophageal Phase - Solids Pill: Prominent cricopharyngeal segment;Reduced cricopharyngeal relaxation (Lodged below the CP/UES (? in Zinker's)) Cervical Esophageal Phase - Comment Cervical Esophageal Comment: The 12.5 mm barium tablet lodged below the UES and was extremely uncomfortable for the patient.  After numerous swallows of puree, water, nectar thick barium, and over 5 minutes, the pill finally cleared (probably had begun to dissolve).    Vanessa [name] T 12/29/2011, 3:33 PM

## 2011-12-31 ENCOUNTER — Ambulatory Visit (INDEPENDENT_AMBULATORY_CARE_PROVIDER_SITE_OTHER): Payer: Medicare Other | Admitting: *Deleted

## 2011-12-31 DIAGNOSIS — I4891 Unspecified atrial fibrillation: Secondary | ICD-10-CM | POA: Diagnosis not present

## 2011-12-31 DIAGNOSIS — R1310 Dysphagia, unspecified: Secondary | ICD-10-CM | POA: Diagnosis not present

## 2011-12-31 LAB — POCT INR: INR: 2.6

## 2012-01-05 DIAGNOSIS — S79929A Unspecified injury of unspecified thigh, initial encounter: Secondary | ICD-10-CM | POA: Diagnosis not present

## 2012-01-05 DIAGNOSIS — M25559 Pain in unspecified hip: Secondary | ICD-10-CM | POA: Diagnosis not present

## 2012-01-05 DIAGNOSIS — S7000XA Contusion of unspecified hip, initial encounter: Secondary | ICD-10-CM | POA: Diagnosis not present

## 2012-01-05 DIAGNOSIS — S79919A Unspecified injury of unspecified hip, initial encounter: Secondary | ICD-10-CM | POA: Diagnosis not present

## 2012-01-10 ENCOUNTER — Ambulatory Visit (INDEPENDENT_AMBULATORY_CARE_PROVIDER_SITE_OTHER): Payer: Medicare Other | Admitting: Emergency Medicine

## 2012-01-10 VITALS — BP 120/58 | HR 72 | Temp 97.4°F | Resp 16 | Ht 61.25 in | Wt 122.2 lb

## 2012-01-10 DIAGNOSIS — S8010XA Contusion of unspecified lower leg, initial encounter: Secondary | ICD-10-CM | POA: Diagnosis not present

## 2012-01-10 DIAGNOSIS — S41109A Unspecified open wound of unspecified upper arm, initial encounter: Secondary | ICD-10-CM

## 2012-01-10 DIAGNOSIS — S41119A Laceration without foreign body of unspecified upper arm, initial encounter: Secondary | ICD-10-CM

## 2012-01-10 MED ORDER — TRAMADOL HCL 50 MG PO TABS: 50.0000 mg | ORAL_TABLET | Freq: Four times a day (QID) | ORAL | Status: DC | PRN

## 2012-01-10 NOTE — Patient Instructions (Signed)

## 2012-01-10 NOTE — Progress Notes (Signed)
[facility] [address], [place] [address] [phone]  Date:  01/10/2012   Name:  Vanessa [name]   DOB:  [birth year]   MRN:  [id number]  PCP:  Cassell Clement, MD    Chief Complaint: Follow-up   History of Present Illness:  Vanessa [name] is a 78 y.o. very pleasant female patient who presents with the following:  Tripped and fell on Christmas Eve and was seen in ER in [place].  Told to follow up on return home and has come in here.  Not taking prescribed vicodin.  Not sleeping due to pain and is not receiving pain relief with ibuprofen and aleve.  Denies injury other than substantial bruising on right hip and thigh.  Uses cane and walker for ambulation.  Denies syncope or dizziness as cause of fall, says she tripped.  Has an avulsion on right arm and is up to date on tetanus.  Patient Active Problem List  Diagnosis  . Atrial fibrillation  . Neuropathy  . Carotid artery occlusion  . Hypercholesterolemia  . Scoliosis  . Hoarseness  . Headache  . Dizziness  . Abdominal bloating  . Bruises easily  . Pruritus  . Raynaud phenomenon  . Osteoarthritis  . Chest pain  . Leg ulcer  . Pain, ankle  . Ankle ulcer  . Malaise and fatigue  . Varicose veins of lower extremities with other complications    Past Medical History  Diagnosis Date  . Atrial fibrillation   . Neuropathy     PERIPHERAL  . Carotid artery occlusion     LEFT  . Scoliosis   . Hoarseness   . Headache   . Dizziness   . Abdominal bloating   . Bruises easily   . Pruritus   . Hypercholesterolemia     Past Surgical History  Procedure Date  . Abdominal hysterectomy   . Spine surgery 1997  . Gallbladder surgery     History  Substance Use Topics  . Smoking status: Never Smoker   . Smokeless tobacco: Never Used  . Alcohol Use: No    Family History  Problem Relation Age of Onset  . Heart disease    . Diabetes Mother   . Heart attack Mother   . Heart attack Father    . Cancer Sister     Allergies  Allergen Reactions  . Tape Other (See Comments)    Tears skin.    Medication list has been reviewed and updated.  Current Outpatient Prescriptions on File Prior to Visit  Medication Sig Dispense Refill  . aspirin 81 MG tablet Take 1 tablet (81 mg total) by mouth every Monday, Wednesday, and Friday.      Marland Kitchen atenolol (TENORMIN) 25 MG tablet Take 1 tablet (25 mg total) by mouth 2 (two) times daily.  60 tablet  11  . BIOTIN 5000 PO Take by mouth daily.      . Calcium-Vitamin D-Vitamin K (VIACTIV PO) Take by mouth.      . simvastatin (ZOCOR) 40 MG tablet TAKE 1/2 TABLET EVERY DAY  30 tablet  5  . triamcinolone (KENALOG) 0.1 % cream Apply topically as needed.        . warfarin (COUMADIN) 2 MG tablet TAKE 1 TABLET EVERY DAY 5 DAYS PER WEEK AND 2 TABLETS DAILY 2 DAYS PER WEEK OR AS DIRECTED  90 tablet  5  . hydrocortisone cream 1 % Apply topically 2 (two) times daily.      Marland Kitchen  Multiple Vitamin (MULTIVITAMIN) tablet Take 1 tablet by mouth daily.          Review of Systems:  As per HPI, otherwise negative.    Physical Examination: Filed Vitals:   01/10/12 0845  BP: 120/58  Pulse: 72  Temp: 97.4 F (36.3 C)  Resp: 16   Filed Vitals:   01/10/12 0845  Height: 5' 1.25" (1.556 m)  Weight: 122 lb 3.2 oz (55.43 kg)   Body mass index is 22.90 kg/(m^2). Ideal Body Weight: Weight in (lb) to have BMI = 25: 133.1    GEN: WDWN, NAD, Non-toxic, Alert & Oriented x 3 HEENT: Atraumatic, Normocephalic.  Ears and Nose: No external deformity. EXTR: No clubbing/cyanosis/edema NEURO: Normal gait.  PSYCH: Normally interactive. Conversant. Not depressed or anxious appearing.  Calm demeanor.  RIGHT ARM:  Skin avulsion. No evidence cellulitis RIGHT LEG:  Massive bruising right thigh descending into calf.  Assessment and Plan: Multiple contusions Avulsion arm Tramadol Heating pad  Carmelina Dane, MD

## 2012-01-17 ENCOUNTER — Ambulatory Visit (INDEPENDENT_AMBULATORY_CARE_PROVIDER_SITE_OTHER): Payer: Medicare Other | Admitting: Emergency Medicine

## 2012-01-17 VITALS — BP 110/69 | HR 65 | Temp 97.6°F | Resp 16 | Ht 62.0 in | Wt 121.0 lb

## 2012-01-17 DIAGNOSIS — S8010XA Contusion of unspecified lower leg, initial encounter: Secondary | ICD-10-CM

## 2012-01-17 NOTE — Progress Notes (Signed)
[facility] [address], [place] [address] [phone]  Date:  01/17/2012   Name:  Vanessa [name]   DOB:  06-Dec-1920   MRN:  [id number]  PCP:  Cassell Clement, MD    Chief Complaint: Follow-up   History of Present Illness:  Vanessa [name] is a 79 y.o. very pleasant female patient who presents with the following:  Interval history of improvement in bruising in right leg after a fall.  No other complaints.  Ambulatory and not falling.    Patient Active Problem List  Diagnosis  . Atrial fibrillation  . Neuropathy  . Carotid artery occlusion  . Hypercholesterolemia  . Scoliosis  . Hoarseness  . Headache  . Dizziness  . Abdominal bloating  . Bruises easily  . Pruritus  . Raynaud phenomenon  . Osteoarthritis  . Chest pain  . Leg ulcer  . Pain, ankle  . Ankle ulcer  . Malaise and fatigue  . Varicose veins of lower extremities with other complications    Past Medical History  Diagnosis Date  . Atrial fibrillation   . Neuropathy     PERIPHERAL  . Carotid artery occlusion     LEFT  . Scoliosis   . Hoarseness   . Headache   . Dizziness   . Abdominal bloating   . Bruises easily   . Pruritus   . Hypercholesterolemia     Past Surgical History  Procedure Date  . Abdominal hysterectomy   . Spine surgery 1997  . Gallbladder surgery     History  Substance Use Topics  . Smoking status: Never Smoker   . Smokeless tobacco: Never Used  . Alcohol Use: No    Family History  Problem Relation Age of Onset  . Heart disease    . Diabetes Mother   . Heart attack Mother   . Heart attack Father   . Cancer Sister     Allergies  Allergen Reactions  . Tape Other (See Comments)    Tears skin.    Medication list has been reviewed and updated.  Current Outpatient Prescriptions on File Prior to Visit  Medication Sig Dispense Refill  . aspirin 81 MG tablet Take 1 tablet (81 mg total) by mouth every Monday, Wednesday, and Friday.       Marland Kitchen atenolol (TENORMIN) 25 MG tablet Take 1 tablet (25 mg total) by mouth 2 (two) times daily.  60 tablet  11  . BIOTIN 5000 PO Take by mouth daily.      . Calcium-Vitamin D-Vitamin K (VIACTIV PO) Take by mouth.      . hydrocortisone cream 1 % Apply topically 2 (two) times daily.      . Multiple Vitamin (MULTIVITAMIN) tablet Take 1 tablet by mouth daily.        Marland Kitchen OVER THE COUNTER MEDICATION Cetaphil Cream for skin      . simvastatin (ZOCOR) 40 MG tablet TAKE 1/2 TABLET EVERY DAY  30 tablet  5  . traMADol (ULTRAM) 50 MG tablet Take 1-2 tablets (50-100 mg total) by mouth every 6 (six) hours as needed for pain.  50 tablet  0  . triamcinolone (KENALOG) 0.1 % cream Apply topically as needed.        . warfarin (COUMADIN) 2 MG tablet TAKE 1 TABLET EVERY DAY 5 DAYS PER WEEK AND 2 TABLETS DAILY 2 DAYS PER WEEK OR AS DIRECTED  90 tablet  5    Review of Systems:  As per  HPI, otherwise negative.    Physical Examination: Filed Vitals:   01/17/12 1411  BP: 110/69  Pulse: 65  Temp: 97.6 F (36.4 C)  Resp: 16   Filed Vitals:   01/17/12 1411  Height: 5\' 2"  (1.575 m)  Weight: 121 lb (54.885 kg)   Body mass index is 22.13 kg/(m^2). Ideal Body Weight: Weight in (lb) to have BMI = 25: 136.4    GEN: WDWN, NAD, Non-toxic, Alert & Oriented x 3 HEENT: Atraumatic, Normocephalic.  Ears and Nose: No external deformity. EXTR: No clubbing/cyanosis/edema NEURO: Normal gait.  PSYCH: Normally interactive. Conversant. Not depressed or anxious appearing.  Calm demeanor.  Right Leg:  Tender and ecchymotic right thigh.  Ecchymosis coursing down leg.  Assessment and Plan: Contusion leg Follow up as needed  Vanessa Dane, MD

## 2012-01-24 ENCOUNTER — Ambulatory Visit (INDEPENDENT_AMBULATORY_CARE_PROVIDER_SITE_OTHER): Payer: Medicare Other | Admitting: Emergency Medicine

## 2012-01-24 VITALS — BP 116/66 | HR 83 | Temp 97.5°F | Resp 17 | Ht 62.5 in | Wt 118.0 lb

## 2012-01-24 DIAGNOSIS — S7000XA Contusion of unspecified hip, initial encounter: Secondary | ICD-10-CM | POA: Diagnosis not present

## 2012-01-24 NOTE — Progress Notes (Signed)
[facility] [address] [place] [address] [phone]  Date:  01/24/2012   Name:  Vanessa [name]   DOB:  July 26, 1920   MRN:  [id number]  PCP:  Cassell Clement, MD    Chief Complaint: Wound Check   History of Present Illness:  Vanessa [name] is a 79 y.o. very pleasant female patient who presents with the following:  Larey Seat with large contusion right hip.  Interval history of persistent pain but improvement.  Hematoma less tender and diminishing.  Patient Active Problem List  Diagnosis  . Atrial fibrillation  . Neuropathy  . Carotid artery occlusion  . Hypercholesterolemia  . Scoliosis  . Hoarseness  . Headache  . Dizziness  . Abdominal bloating  . Bruises easily  . Pruritus  . Raynaud phenomenon  . Osteoarthritis  . Chest pain  . Leg ulcer  . Pain, ankle  . Ankle ulcer  . Malaise and fatigue  . Varicose veins of lower extremities with other complications    Past Medical History  Diagnosis Date  . Atrial fibrillation   . Neuropathy     PERIPHERAL  . Carotid artery occlusion     LEFT  . Scoliosis   . Hoarseness   . Headache   . Dizziness   . Abdominal bloating   . Bruises easily   . Pruritus   . Hypercholesterolemia     Past Surgical History  Procedure Date  . Abdominal hysterectomy   . Spine surgery 1997  . Gallbladder surgery     History  Substance Use Topics  . Smoking status: Never Smoker   . Smokeless tobacco: Never Used  . Alcohol Use: No    Family History  Problem Relation Age of Onset  . Heart disease    . Diabetes Mother   . Heart attack Mother   . Heart attack Father   . Cancer Sister     Allergies  Allergen Reactions  . Tape Other (See Comments)    Tears skin.    Medication list has been reviewed and updated.  Current Outpatient Prescriptions on File Prior to Visit  Medication Sig Dispense Refill  . aspirin 81 MG tablet Take 1 tablet (81 mg total) by mouth every Monday, Wednesday, and  Friday.      Marland Kitchen atenolol (TENORMIN) 25 MG tablet Take 1 tablet (25 mg total) by mouth 2 (two) times daily.  60 tablet  11  . BIOTIN 5000 PO Take by mouth daily.      . Calcium-Vitamin D-Vitamin K (VIACTIV PO) Take by mouth.      . Multiple Vitamin (MULTIVITAMIN) tablet Take 1 tablet by mouth daily.        . simvastatin (ZOCOR) 40 MG tablet TAKE 1/2 TABLET EVERY DAY  30 tablet  5  . traMADol (ULTRAM) 50 MG tablet Take 1-2 tablets (50-100 mg total) by mouth every 6 (six) hours as needed for pain.  50 tablet  0  . warfarin (COUMADIN) 2 MG tablet TAKE 1 TABLET EVERY DAY 5 DAYS PER WEEK AND 2 TABLETS DAILY 2 DAYS PER WEEK OR AS DIRECTED  90 tablet  5  . hydrocortisone cream 1 % Apply topically 2 (two) times daily.      Marland Kitchen OVER THE COUNTER MEDICATION Cetaphil Cream for skin      . triamcinolone (KENALOG) 0.1 % cream Apply topically as needed.          Review of Systems:  As per HPI, otherwise  negative.    Physical Examination: Filed Vitals:   01/24/12 1055  BP: 116/66  Pulse: 83  Temp: 97.5 F (36.4 C)  Resp: 17   Filed Vitals:   01/24/12 1055  Height: 5' 2.5" (1.588 m)  Weight: 118 lb (53.524 kg)   Body mass index is 21.24 kg/(m^2). Ideal Body Weight: Weight in (lb) to have BMI = 25: 138.6    GEN: WDWN, NAD, Non-toxic, Alert & Oriented x 3 HEENT: Atraumatic, Normocephalic.  Ears and Nose: No external deformity. EXTR: No clubbing/cyanosis/edema NEURO: Normal gait.  PSYCH: Normally interactive. Conversant. Not depressed or anxious appearing.  Calm demeanor.  RIGHT HIP:  Improved range of motion  Assessment and Plan: Contusion right hip Follow up as needed  Carmelina Dane, MD

## 2012-01-28 ENCOUNTER — Other Ambulatory Visit: Payer: Self-pay

## 2012-01-28 MED ORDER — WARFARIN SODIUM 2 MG PO TABS: 2.0000 mg | ORAL_TABLET | ORAL | Status: DC

## 2012-01-31 ENCOUNTER — Other Ambulatory Visit: Payer: Self-pay | Admitting: *Deleted

## 2012-01-31 DIAGNOSIS — I6529 Occlusion and stenosis of unspecified carotid artery: Secondary | ICD-10-CM

## 2012-02-07 ENCOUNTER — Other Ambulatory Visit: Payer: Self-pay | Admitting: *Deleted

## 2012-02-07 DIAGNOSIS — I4891 Unspecified atrial fibrillation: Secondary | ICD-10-CM

## 2012-02-07 DIAGNOSIS — R5381 Other malaise: Secondary | ICD-10-CM

## 2012-02-07 DIAGNOSIS — R5383 Other fatigue: Secondary | ICD-10-CM

## 2012-02-07 DIAGNOSIS — E7800 Pure hypercholesterolemia, unspecified: Secondary | ICD-10-CM

## 2012-02-07 DIAGNOSIS — I6529 Occlusion and stenosis of unspecified carotid artery: Secondary | ICD-10-CM

## 2012-02-08 ENCOUNTER — Ambulatory Visit (INDEPENDENT_AMBULATORY_CARE_PROVIDER_SITE_OTHER): Payer: Medicare Other | Admitting: *Deleted

## 2012-02-08 ENCOUNTER — Other Ambulatory Visit (INDEPENDENT_AMBULATORY_CARE_PROVIDER_SITE_OTHER): Payer: Medicare Other

## 2012-02-08 ENCOUNTER — Ambulatory Visit (INDEPENDENT_AMBULATORY_CARE_PROVIDER_SITE_OTHER): Payer: Medicare Other | Admitting: Cardiology

## 2012-02-08 ENCOUNTER — Encounter: Payer: Self-pay | Admitting: Cardiology

## 2012-02-08 VITALS — BP 110/70 | HR 66 | Resp 18 | Ht 61.0 in | Wt 118.0 lb

## 2012-02-08 DIAGNOSIS — E785 Hyperlipidemia, unspecified: Secondary | ICD-10-CM

## 2012-02-08 DIAGNOSIS — I4892 Unspecified atrial flutter: Secondary | ICD-10-CM | POA: Diagnosis not present

## 2012-02-08 DIAGNOSIS — S0003XD Contusion of scalp, subsequent encounter: Secondary | ICD-10-CM | POA: Insufficient documentation

## 2012-02-08 DIAGNOSIS — R5383 Other fatigue: Secondary | ICD-10-CM | POA: Diagnosis not present

## 2012-02-08 DIAGNOSIS — R5381 Other malaise: Secondary | ICD-10-CM

## 2012-02-08 DIAGNOSIS — T148XXA Other injury of unspecified body region, initial encounter: Secondary | ICD-10-CM | POA: Insufficient documentation

## 2012-02-08 DIAGNOSIS — R238 Other skin changes: Secondary | ICD-10-CM | POA: Diagnosis not present

## 2012-02-08 DIAGNOSIS — I6529 Occlusion and stenosis of unspecified carotid artery: Secondary | ICD-10-CM | POA: Diagnosis not present

## 2012-02-08 DIAGNOSIS — E7800 Pure hypercholesterolemia, unspecified: Secondary | ICD-10-CM

## 2012-02-08 DIAGNOSIS — I4891 Unspecified atrial fibrillation: Secondary | ICD-10-CM | POA: Diagnosis not present

## 2012-02-08 LAB — LIPID PANEL
Cholesterol: 171 mg/dL (ref 0–200)
HDL: 64.1 mg/dL (ref >39.00)
LDL Cholesterol: 83 mg/dL (ref 0–99)
Total CHOL/HDL Ratio: 3
Triglycerides: 120 mg/dL (ref 0.0–149.0)
VLDL: 24 mg/dL (ref 0.0–40.0)

## 2012-02-08 LAB — BASIC METABOLIC PANEL
BUN: 18 mg/dL (ref 6–23)
CO2: 29 mEq/L (ref 19–32)
Calcium: 9.2 mg/dL (ref 8.4–10.5)
Chloride: 102 mEq/L (ref 96–112)
Creatinine, Ser: 0.9 mg/dL (ref 0.4–1.2)
GFR: 65.01 mL/min (ref >60.00)
Glucose, Bld: 91 mg/dL (ref 70–99)
Potassium: 4.3 mEq/L (ref 3.5–5.1)
Sodium: 137 mEq/L (ref 135–145)

## 2012-02-08 LAB — HEPATIC FUNCTION PANEL
ALT: 17 U/L (ref 0–35)
AST: 32 U/L (ref 0–37)
Albumin: 3.6 g/dL (ref 3.5–5.2)
Alkaline Phosphatase: 85 U/L (ref 39–117)
Bilirubin, Direct: 0.1 mg/dL (ref 0.0–0.3)
Total Bilirubin: 0.9 mg/dL (ref 0.3–1.2)
Total Protein: 6.8 g/dL (ref 6.0–8.3)

## 2012-02-08 LAB — TSH: TSH: 4.58 u[IU]/mL (ref 0.35–5.50)

## 2012-02-08 LAB — POCT INR: INR: 3.4

## 2012-02-08 NOTE — Assessment & Plan Note (Signed)
The patient has a history of atrial flutter fibrillation and is followed in our Coumadin clinic.  She has not been experiencing any TIA symptoms.  He does not have any history of syncope.

## 2012-02-08 NOTE — Assessment & Plan Note (Signed)
The patient has a history of asymptomatic total occlusion of the left internal carotid artery.  She is followed for this by vascular surgery

## 2012-02-08 NOTE — Progress Notes (Signed)
Quick Note:  Please report to patient. The recent labs are stable. Continue same medication and careful diet.Lipids are better. ______ 

## 2012-02-08 NOTE — Patient Instructions (Addendum)
Your physician has recommended you make the following change in your medication:  STOP ASPIRIN DUE TO FREQUESNT NOSE BLEEDS  Your physician recommends that you schedule a follow-up appointment in: 4 MONTHS  Your physician recommends that you return for lab work in: 4 MONTHS FOR FASTING LABS

## 2012-02-08 NOTE — Assessment & Plan Note (Signed)
The patient has a history of hypercholesterolemia and is on simvastatin 40 mg daily.  She's not having any myalgias from the simvastatin.  Blood work today is pending.

## 2012-02-08 NOTE — Assessment & Plan Note (Signed)
Her hematoma is now about the size of a softball on her right thigh.  She is unable to sleep on her right side because it is tender.  It is gradually getting smaller in size.  She needs to stay on her Coumadin as directed.we will stop her baby aspirin at this point since she does have a history of falls and has a history of easy bruising and does not have a strong indication for aspirin while she is on Coumadin.  She does not have ischemic heart disease.

## 2012-02-08 NOTE — Progress Notes (Signed)
[name] Date of Birth:  30-Dec-1920 [facility] [address] [place], [place]  [address] [phone]         Fax   [phone]  History of Present Illness: This pleasant 79 year old widowed woman is seen for a scheduled four-month followup office visit. She has a history of atypical chest pain.  She was seen in March 2013 with these symptoms and underwent a nuclear stress test subsequently which showed no ischemia. The patient reports that her symptoms have improved. She recently saw Dr. Matthias Hughs  who placed her on Nexium for dysphagia. She reports improvement in those symptoms since starting on Nexium. She has had a history of some falls and some scrapes. She did injure her right forearm recently and had to have several sutures. She is on long-term Coumadin because of her chronic atrial flutter fibrillation.  She has a history of vascular disease with known chronic occlusion of her left carotid artery. She also has a past history of high blood pressure. She has had a history of osteoporosis and osteoarthritis and is followed by Dr. Darrelyn Hillock.  On Christmas Eve she was visiting her son in [place].  She lost her balance and fell landing on her right lateral thigh.  Fortunately she did not break any bones.  They took her to a local emergency room.  However she did develop a very large hematoma on the right thigh which has been very slow to resolve.  She has been on baby aspirin and Coumadin.   Current Outpatient Prescriptions  Medication Sig Dispense Refill  . atenolol (TENORMIN) 25 MG tablet Take 1 tablet (25 mg total) by mouth 2 (two) times daily.  60 tablet  11  . BIOTIN 5000 PO Take by mouth daily.      . Calcium-Vitamin D-Vitamin K (VIACTIV PO) Take by mouth.      . Emollient (EUCERIN) lotion Apply topically as needed.      . hydrocortisone cream 1 % Apply topically 2 (two) times daily.      . Multiple Vitamin (MULTIVITAMIN) tablet Take 1 tablet by mouth daily.         Marland Kitchen OVER THE COUNTER MEDICATION Cetaphil Cream for skin      . simvastatin (ZOCOR) 40 MG tablet TAKE 1/2 TABLET EVERY DAY  30 tablet  5  . traMADol (ULTRAM) 50 MG tablet Take 1-2 tablets (50-100 mg total) by mouth every 6 (six) hours as needed for pain.  50 tablet  0  . triamcinolone (KENALOG) 0.1 % cream Apply topically as needed.        . warfarin (COUMADIN) 2 MG tablet Take 1 tablet (2 mg total) by mouth as directed.  90 tablet  1    Allergies  Allergen Reactions  . Tape Other (See Comments)    Tears skin.    Patient Active Problem List  Diagnosis  . Atrial fibrillation  . Neuropathy  . Carotid artery occlusion  . Hypercholesterolemia  . Scoliosis  . Hoarseness  . Headache  . Dizziness  . Abdominal bloating  . Bruises easily  . Pruritus  . Raynaud phenomenon  . Osteoarthritis  . Chest pain  . Leg ulcer  . Pain, ankle  . Ankle ulcer  . Malaise and fatigue  . Varicose veins of lower extremities with other complications    History  Smoking status  . Never Smoker   Smokeless tobacco  . Never Used    History  Alcohol Use No  Family History  Problem Relation Age of Onset  . Heart disease    . Diabetes Mother   . Heart attack Mother   . Heart attack Father   . Cancer Sister     Review of Systems: Constitutional: no fever chills diaphoresis or fatigue or change in weight.  Head and neck: no hearing loss, no epistaxis, no photophobia or visual disturbance. Respiratory: No cough, shortness of breath or wheezing. Cardiovascular: No chest pain peripheral edema, palpitations. Gastrointestinal: No abdominal distention, no abdominal pain, no change in bowel habits hematochezia or melena. Genitourinary: No dysuria, no frequency, no urgency, no nocturia. Musculoskeletal:No arthralgias, no back pain, no gait disturbance or myalgias. Neurological: No dizziness, no headaches, no numbness, no seizures, no syncope, no weakness, no tremors. Hematologic: No  lymphadenopathy, no easy bruising. Psychiatric: No confusion, no hallucinations, no sleep disturbance.    Physical Exam: Filed Vitals:   02/08/12 1002  BP: 110/70  Pulse: 66  Resp: 18   the general appearance reveals a well-developed well-nourished petite elderly woman in no distress.The head and neck exam reveals pupils equal and reactive.  Extraocular movements are full.  There is no scleral icterus.  The mouth and pharynx are normal.  The neck is supple.  The carotids reveal no bruits.  The jugular venous pressure is normal.  The  thyroid is not enlarged.  There is no lymphadenopathy.  The chest is clear to percussion and auscultation.  There are no rales or rhonchi.  Expansion of the chest is symmetrical.  The pulse is irregularly irregular The precordium is quiet.  The first heart sound is normal.  The second heart sound is physiologically split.  There is no murmur gallop rub or click.  There is no abnormal lift or heave.  The abdomen is soft and nontender.  The bowel sounds are normal.  The liver and spleen are not enlarged.  There are no abdominal masses.  There are no abdominal bruits.  Extremities reveal good pedal pulses.  There is no phlebitis or edema.  There is no cyanosis or clubbing.  Strength is normal and symmetrical in all extremities.  There is no lateralizing weakness.  There are no sensory deficits.  The skin is warm and dry.  There is a prominent chronic slowly resolving hematoma on the right hip which is the size of a softball.  There is evidence of migration of the subcutaneous blood down the lateral aspect of the right leg because of gravity causing an extended area of ecchymosis     Assessment / Plan: The patient was reassured.  We are stopping her baby aspirin today.  Continue Coumadin as directed.  Recheck in 4 months for followup office visit EKG lipid panel hepatic function panel basal metabolic panel and CBC

## 2012-02-09 ENCOUNTER — Telehealth: Payer: Self-pay | Admitting: Cardiology

## 2012-02-09 NOTE — Telephone Encounter (Signed)
Pt wanted to confirm that she was to dc asa, reconfirmed to her that yes she was to stop. Pt agreed to plan.

## 2012-02-09 NOTE — Telephone Encounter (Signed)
New Problem:    Patient called in wanting to verify her medications.  Please call back.

## 2012-02-10 ENCOUNTER — Telehealth: Payer: Self-pay | Admitting: *Deleted

## 2012-02-10 NOTE — Telephone Encounter (Signed)
Advised patient of lab results  

## 2012-02-10 NOTE — Telephone Encounter (Signed)
Message copied by Burnell Blanks on Thu Feb 10, 2012  4:18 PM ------      Message from: Cassell Clement      Created: Tue Feb 08, 2012  8:05 PM       Please report to patient.  The recent labs are stable. Continue same medication and careful diet. Lipids are better.

## 2012-02-15 DIAGNOSIS — J029 Acute pharyngitis, unspecified: Secondary | ICD-10-CM | POA: Diagnosis not present

## 2012-02-21 DIAGNOSIS — L723 Sebaceous cyst: Secondary | ICD-10-CM | POA: Diagnosis not present

## 2012-02-21 DIAGNOSIS — L819 Disorder of pigmentation, unspecified: Secondary | ICD-10-CM | POA: Diagnosis not present

## 2012-02-21 DIAGNOSIS — L299 Pruritus, unspecified: Secondary | ICD-10-CM | POA: Diagnosis not present

## 2012-02-21 DIAGNOSIS — L821 Other seborrheic keratosis: Secondary | ICD-10-CM | POA: Diagnosis not present

## 2012-02-21 DIAGNOSIS — D1801 Hemangioma of skin and subcutaneous tissue: Secondary | ICD-10-CM | POA: Diagnosis not present

## 2012-02-21 DIAGNOSIS — D239 Other benign neoplasm of skin, unspecified: Secondary | ICD-10-CM | POA: Diagnosis not present

## 2012-02-29 ENCOUNTER — Ambulatory Visit (INDEPENDENT_AMBULATORY_CARE_PROVIDER_SITE_OTHER): Payer: Medicare Other | Admitting: *Deleted

## 2012-02-29 DIAGNOSIS — J029 Acute pharyngitis, unspecified: Secondary | ICD-10-CM | POA: Diagnosis not present

## 2012-02-29 DIAGNOSIS — I4891 Unspecified atrial fibrillation: Secondary | ICD-10-CM | POA: Diagnosis not present

## 2012-02-29 LAB — POCT INR: INR: 3.2

## 2012-03-07 DIAGNOSIS — D3130 Benign neoplasm of unspecified choroid: Secondary | ICD-10-CM | POA: Diagnosis not present

## 2012-03-07 DIAGNOSIS — Z961 Presence of intraocular lens: Secondary | ICD-10-CM | POA: Diagnosis not present

## 2012-03-07 DIAGNOSIS — H40059 Ocular hypertension, unspecified eye: Secondary | ICD-10-CM | POA: Diagnosis not present

## 2012-03-13 ENCOUNTER — Ambulatory Visit (INDEPENDENT_AMBULATORY_CARE_PROVIDER_SITE_OTHER): Payer: Medicare Other | Admitting: *Deleted

## 2012-03-13 DIAGNOSIS — I4891 Unspecified atrial fibrillation: Secondary | ICD-10-CM

## 2012-03-13 LAB — POCT INR: INR: 2.8

## 2012-03-17 ENCOUNTER — Ambulatory Visit: Payer: Medicare Other | Admitting: Family Medicine

## 2012-03-20 ENCOUNTER — Other Ambulatory Visit: Payer: Self-pay | Admitting: *Deleted

## 2012-03-20 MED ORDER — SIMVASTATIN 40 MG PO TABS: 20.0000 mg | ORAL_TABLET | Freq: Every day | ORAL | Status: DC

## 2012-03-31 ENCOUNTER — Ambulatory Visit (INDEPENDENT_AMBULATORY_CARE_PROVIDER_SITE_OTHER): Payer: Medicare Other | Admitting: Pharmacist

## 2012-03-31 DIAGNOSIS — I4891 Unspecified atrial fibrillation: Secondary | ICD-10-CM

## 2012-03-31 LAB — POCT INR: INR: 3.7

## 2012-04-05 DIAGNOSIS — M1710 Unilateral primary osteoarthritis, unspecified knee: Secondary | ICD-10-CM | POA: Diagnosis not present

## 2012-04-05 DIAGNOSIS — M5030 Other cervical disc degeneration, unspecified cervical region: Secondary | ICD-10-CM | POA: Diagnosis not present

## 2012-04-05 DIAGNOSIS — IMO0002 Reserved for concepts with insufficient information to code with codable children: Secondary | ICD-10-CM | POA: Diagnosis not present

## 2012-04-06 ENCOUNTER — Telehealth: Payer: Self-pay

## 2012-04-06 NOTE — Telephone Encounter (Signed)
Phone call from pt. Reported she had episode of a " terrible shooting pain" to the left lower leg @ previous hematoma site on 3/25 and 3/26.  Denies any localized redness, warmth, or swelling.  Does report swelling of left ankle; states ankle swelling is not new.  Denies pain with standing.  Wanted to discuss w/ Dr. Edilia Bo.  Advised pt. would require an office exam for evaluation.  Stated her brother, out of state, is critically ill, and she cannot commit to an appt. At this time.  Advised to call and report any localized inflammation/tenderness, increase in swelling of left lower leg, or open sore.  Pt. Verb. Understanding, and will call back to schedule appt.

## 2012-04-09 ENCOUNTER — Ambulatory Visit (INDEPENDENT_AMBULATORY_CARE_PROVIDER_SITE_OTHER): Payer: Medicare Other | Admitting: Emergency Medicine

## 2012-04-09 VITALS — BP 155/72 | HR 85 | Temp 97.5°F | Resp 18 | Ht 62.0 in | Wt 120.0 lb

## 2012-04-09 DIAGNOSIS — S61409A Unspecified open wound of unspecified hand, initial encounter: Secondary | ICD-10-CM | POA: Diagnosis not present

## 2012-04-09 DIAGNOSIS — S61412A Laceration without foreign body of left hand, initial encounter: Secondary | ICD-10-CM

## 2012-04-09 NOTE — Patient Instructions (Signed)

## 2012-04-09 NOTE — Progress Notes (Signed)
[facility] [address], [place] [address] [phone]  Date:  04/09/2012   Name:  Vanessa [name]   DOB:  [birth year]   MRN:  [id number]  PCP:  Cassell Clement, MD    Chief Complaint: Fall   History of Present Illness:  Vanessa [name] is a 79 y.o. very pleasant female patient who presents with the following:  Injured her left hand and has a skin tear on the left dorsal hand.  Current on TD.  Denies other complaints.    Patient Active Problem List  Diagnosis  . Atrial fibrillation  . Neuropathy  . Carotid artery occlusion  . Hypercholesterolemia  . Scoliosis  . Hoarseness  . Headache  . Dizziness  . Abdominal bloating  . Bruises easily  . Pruritus  . Raynaud phenomenon  . Osteoarthritis  . Chest pain  . Leg ulcer  . Pain, ankle  . Ankle ulcer  . Malaise and fatigue  . Varicose veins of lower extremities with other complications  . Hematoma    Past Medical History  Diagnosis Date  . Atrial fibrillation   . Neuropathy     PERIPHERAL  . Carotid artery occlusion     LEFT  . Scoliosis   . Hoarseness   . Headache   . Dizziness   . Abdominal bloating   . Bruises easily   . Pruritus   . Hypercholesterolemia     Past Surgical History  Procedure Laterality Date  . Abdominal hysterectomy    . Spine surgery  1997  . Gallbladder surgery      History  Substance Use Topics  . Smoking status: Never Smoker   . Smokeless tobacco: Never Used  . Alcohol Use: No    Family History  Problem Relation Age of Onset  . Heart disease    . Diabetes Mother   . Heart attack Mother   . Heart attack Father   . Cancer Sister     Allergies  Allergen Reactions  . Tape Other (See Comments)    Tears skin.    Medication list has been reviewed and updated.  Current Outpatient Prescriptions on File Prior to Visit  Medication Sig Dispense Refill  . atenolol (TENORMIN) 25 MG tablet Take 1 tablet (25 mg total) by mouth 2 (two) times  daily.  60 tablet  11  . BIOTIN 5000 PO Take by mouth daily.      . Calcium-Vitamin D-Vitamin K (VIACTIV PO) Take by mouth.      . Emollient (EUCERIN) lotion Apply topically as needed.      . Multiple Vitamin (MULTIVITAMIN) tablet Take 1 tablet by mouth daily.        Marland Kitchen OVER THE COUNTER MEDICATION Cetaphil Cream for skin      . simvastatin (ZOCOR) 40 MG tablet Take 0.5 tablets (20 mg total) by mouth daily.  30 tablet  5  . traMADol (ULTRAM) 50 MG tablet Take 1-2 tablets (50-100 mg total) by mouth every 6 (six) hours as needed for pain.  50 tablet  0  . triamcinolone (KENALOG) 0.1 % cream Apply topically as needed.        . warfarin (COUMADIN) 2 MG tablet Take 1 tablet (2 mg total) by mouth as directed.  90 tablet  1   No current facility-administered medications on file prior to visit.    Review of Systems:  As per HPI, otherwise negative.    Physical Examination: Filed Vitals:   04/09/12  1412  BP: 155/72  Pulse: 85  Temp: 97.5 F (36.4 C)  Resp: 18   Filed Vitals:   04/09/12 1412  Height: 5\' 2"  (1.575 m)  Weight: 120 lb (54.432 kg)   Body mass index is 21.94 kg/(m^2). Ideal Body Weight: Weight in (lb) to have BMI = 25: 136.4   GEN: WDWN, NAD, Non-toxic, Alert & Oriented x 3 HEENT: Atraumatic, Normocephalic.  Ears and Nose: No external deformity. EXTR: No clubbing/cyanosis/edema NEURO: Normal gait.  PSYCH: Normally interactive. Conversant. Not depressed or anxious appearing.  Calm demeanor.  SKIN:  Three corner laceration of left hand dorsum.  NATI.  No foreign body.  Assessment and Plan: Laceration hand Treated with benzoin and steri strips Change dressing daily Follow up in one week   Signed,  Phillips Odor, MD

## 2012-04-10 ENCOUNTER — Telehealth: Payer: Self-pay

## 2012-04-10 NOTE — Telephone Encounter (Signed)
Patient states Dr Dareen Piano glued it for her then gave her bandage supplies. Her bandage is stuck,she is advised to try some water mixed with 1/4 peroxide. She will try this if not helpful she will call me back or come in.

## 2012-04-10 NOTE — Telephone Encounter (Signed)
PT REALLY WOULD LIKE TO SPEAK WITH SOMEONE REGARDING HER VISIT. PLEASE CALL [phone]. IT IS ABOUT HER FINGERS BEING GLUED

## 2012-04-13 ENCOUNTER — Telehealth: Payer: Self-pay

## 2012-04-13 NOTE — Telephone Encounter (Signed)
Pt had questions about her bandages.  Advised her to keep air to it on her down time and not doing anything and to keep it cover if she is out so it will not get dirty. Her brother just past away today and she is not sure if she be in to follow up on Sunday.  Gave her Dr. Dareen Piano schedule for the week just in case she could not make it on Sunday.

## 2012-04-13 NOTE — Telephone Encounter (Signed)
Needs some advice about bandages on her fingers. See previous message from 3/31. She is going out of town in the a.m. Please call cell phone on Friday [phone].  If calling on Thursday call her at home [phone]

## 2012-04-14 ENCOUNTER — Ambulatory Visit (INDEPENDENT_AMBULATORY_CARE_PROVIDER_SITE_OTHER): Payer: Medicare Other | Admitting: *Deleted

## 2012-04-14 DIAGNOSIS — I4891 Unspecified atrial fibrillation: Secondary | ICD-10-CM | POA: Diagnosis not present

## 2012-04-14 LAB — POCT INR: INR: 1.9

## 2012-04-16 ENCOUNTER — Ambulatory Visit (INDEPENDENT_AMBULATORY_CARE_PROVIDER_SITE_OTHER): Payer: Medicare Other | Admitting: Emergency Medicine

## 2012-04-16 ENCOUNTER — Encounter: Payer: Self-pay | Admitting: Emergency Medicine

## 2012-04-16 VITALS — BP 130/76 | HR 86 | Temp 98.5°F | Resp 16 | Ht 62.0 in | Wt 120.0 lb

## 2012-04-16 DIAGNOSIS — Z4801 Encounter for change or removal of surgical wound dressing: Secondary | ICD-10-CM

## 2012-04-16 MED ORDER — TRAMADOL HCL 50 MG PO TABS: 50.0000 mg | ORAL_TABLET | Freq: Four times a day (QID) | ORAL | Status: DC | PRN

## 2012-04-16 NOTE — Progress Notes (Signed)
[facility] [address] [place] [address] [phone]  Date:  04/16/2012   Name:  Vanessa [name]   DOB:  13-May-1920   MRN:  [id number]  PCP:  Vanessa Clement, MD    Chief Complaint: Follow-up   History of Present Illness:  Vanessa [name] is a 79 y.o. very pleasant female patient who presents with the following:  Treated a week ago for large left hand skin tear.  Now for recheck.  No evidence further bleeding.  No fever or chills. No pain or tenderness.  Denies other complaint or health concern today.   Patient Active Problem List  Diagnosis  . Atrial fibrillation  . Neuropathy  . Carotid artery occlusion  . Hypercholesterolemia  . Scoliosis  . Hoarseness  . Headache  . Dizziness  . Abdominal bloating  . Bruises easily  . Pruritus  . Raynaud phenomenon  . Osteoarthritis  . Chest pain  . Leg ulcer  . Pain, ankle  . Ankle ulcer  . Malaise and fatigue  . Varicose veins of lower extremities with other complications  . Hematoma    Past Medical History  Diagnosis Date  . Atrial fibrillation   . Neuropathy     PERIPHERAL  . Carotid artery occlusion     LEFT  . Scoliosis   . Hoarseness   . Headache   . Dizziness   . Abdominal bloating   . Bruises easily   . Pruritus   . Hypercholesterolemia     Past Surgical History  Procedure Laterality Date  . Abdominal hysterectomy    . Spine surgery  1997  . Gallbladder surgery      History  Substance Use Topics  . Smoking status: Never Smoker   . Smokeless tobacco: Never Used  . Alcohol Use: No    Family History  Problem Relation Age of Onset  . Heart disease    . Diabetes Mother   . Heart attack Mother   . Heart attack Father   . Cancer Sister     Allergies  Allergen Reactions  . Tape Other (See Comments)    Tears skin.    Medication list has been reviewed and updated.  Current Outpatient Prescriptions on File Prior to Visit  Medication Sig Dispense Refill  .  atenolol (TENORMIN) 25 MG tablet Take 1 tablet (25 mg total) by mouth 2 (two) times daily.  60 tablet  11  . BIOTIN 5000 PO Take by mouth daily.      . Calcium-Vitamin D-Vitamin K (VIACTIV PO) Take by mouth.      . Emollient (EUCERIN) lotion Apply topically as needed.      . Multiple Vitamin (MULTIVITAMIN) tablet Take 1 tablet by mouth daily.        Marland Kitchen OVER THE COUNTER MEDICATION Cetaphil Cream for skin      . simvastatin (ZOCOR) 40 MG tablet Take 0.5 tablets (20 mg total) by mouth daily.  30 tablet  5  . traMADol (ULTRAM) 50 MG tablet Take 1-2 tablets (50-100 mg total) by mouth every 6 (six) hours as needed for pain.  50 tablet  0  . triamcinolone (KENALOG) 0.1 % cream Apply topically as needed.        . warfarin (COUMADIN) 2 MG tablet Take 1 tablet (2 mg total) by mouth as directed.  90 tablet  1   No current facility-administered medications on file prior to visit.    Review of Systems:  As per  HPI, otherwise negative.    Physical Examination: Filed Vitals:   04/16/12 1109  BP: 130/76  Pulse: 86  Temp: 98.5 F (36.9 C)  Resp: 16   Filed Vitals:   04/16/12 1109  Height: 5\' 2"  (1.575 m)  Weight: 120 lb (54.432 kg)   Body mass index is 21.94 kg/(m^2). Ideal Body Weight: Weight in (lb) to have BMI = 25: 136.4   GEN: WDWN, NAD, Non-toxic, Alert & Oriented x 3 HEENT: Atraumatic, Normocephalic.  Ears and Nose: No external deformity. EXTR: No clubbing/cyanosis/edema NEURO: Normal gait.  PSYCH: Normally interactive. Conversant. Not depressed or anxious appearing.  Calm demeanor.  Left hand:  Wound dry with no erythema or swelling.  Assessment and Plan: Wound recheck  Signed,  Phillips Odor, MD

## 2012-04-16 NOTE — Patient Instructions (Addendum)

## 2012-04-20 ENCOUNTER — Encounter: Payer: Self-pay | Admitting: Family Medicine

## 2012-04-20 ENCOUNTER — Ambulatory Visit (INDEPENDENT_AMBULATORY_CARE_PROVIDER_SITE_OTHER): Payer: Medicare Other | Admitting: Family Medicine

## 2012-04-20 VITALS — BP 114/62 | HR 76 | Temp 99.1°F | Resp 16 | Ht 62.5 in | Wt 117.2 lb

## 2012-04-20 DIAGNOSIS — Z9181 History of falling: Secondary | ICD-10-CM | POA: Diagnosis not present

## 2012-04-20 DIAGNOSIS — I8390 Asymptomatic varicose veins of unspecified lower extremity: Secondary | ICD-10-CM | POA: Diagnosis not present

## 2012-04-20 DIAGNOSIS — M216X9 Other acquired deformities of unspecified foot: Secondary | ICD-10-CM | POA: Diagnosis not present

## 2012-04-20 DIAGNOSIS — Z0000 Encounter for general adult medical examination without abnormal findings: Secondary | ICD-10-CM | POA: Diagnosis not present

## 2012-04-20 DIAGNOSIS — R296 Repeated falls: Secondary | ICD-10-CM

## 2012-04-20 DIAGNOSIS — I8393 Asymptomatic varicose veins of bilateral lower extremities: Secondary | ICD-10-CM

## 2012-04-20 LAB — POCT URINALYSIS DIPSTICK
Bilirubin, UA: NEGATIVE
Glucose, UA: NEGATIVE
Nitrite, UA: NEGATIVE
Protein, UA: NEGATIVE
Spec Grav, UA: 1.02
Urobilinogen, UA: 0.2
pH, UA: 6.5

## 2012-04-20 NOTE — Progress Notes (Signed)
Subjective:    Patient ID: Vanessa [name], female    DOB: Feb 10, 1920, 79 y.o.   MRN: [id number]  HPI  This 79 y.o. Cauc female is her for annual [facility] CPE; she has several chronic medical  issues that are managed by various specialists. Most recently, she has been seen at [facility]  for treratment of skin tears and lacerations due to falls. She lives alone and has a walker that she  uses at times. She also ambulates w/ a cane when outside the home (she left it in the car today).  Pt recently seen by Podiatry for treatment of foot pain and callouses; she is wearing [employer]  tennis shoes today; this is the type of footwear she has been advised to wear.   Pt still drives and takes care of ADLs. She has a fair appetite and eats 2 meals daily w/ snacks.  She has reasonable concerns about aging. She has an electric chair lift in her home so that   she can navigate the stairs.    Review of Systems  Constitutional: Negative.   HENT: Positive for hearing loss, trouble swallowing, neck pain and dental problem.        Slight difficult swallowing-sees ENT Neck - arthritis (ORTHO advised her that she has severe DDD)  Eyes: Negative.   Respiratory: Negative.   Cardiovascular: Positive for palpitations.       "Occasional"- pt has Atrial fibrillation followed by Cardiology  Gastrointestinal: Negative.   Endocrine: Negative.   Genitourinary: Positive for frequency.        medicine  Musculoskeletal: Positive for back pain and gait problem.       Knee (deg joint disease)- sees ORTHO and had injection recently.  Skin: Positive for wound.       Itching dry skin with easy tears and laceration repair to L wrist on 04/09/12  Allergic/Immunologic: Negative.   Neurological: Negative.   Hematological: Bruises/bleeds easily.       Chronic anti-coagulation w/ Warfarin.  Psychiatric/Behavioral:       Recent death of 71 y.o. Brother this past week.       Objective:   Physical Exam  Nursing note and  vitals reviewed. Constitutional: She is oriented to person, place, and time. Vital signs are normal. She appears well-developed and well-nourished. No distress.  HENT:  Head: Normocephalic and atraumatic.  Right Ear: Hearing, external ear and ear canal normal. Tympanic membrane is scarred. Tympanic membrane is not injected, not erythematous and not retracted. No middle ear effusion.  Left Ear: Hearing, external ear and ear canal normal. Tympanic membrane is scarred. Tympanic membrane is not injected, not erythematous and not retracted.  No middle ear effusion.  Nose: Nose normal. No mucosal edema, nasal deformity or septal deviation.  Mouth/Throat: Uvula is midline and mucous membranes are normal. No oral lesions. Normal dentition. No dental caries. Posterior oropharyngeal erythema present. No oropharyngeal exudate or posterior oropharyngeal edema.  Eyes: Conjunctivae and EOM are normal. Pupils are equal, round, and reactive to light. No scleral icterus.  Wears corrective lenses.  Neck: No JVD present. No thyromegaly present.  Decreased ROM w/ rotation and extension.   Cardiovascular: Normal rate, normal heart sounds and intact distal pulses.  Exam reveals no gallop.   No murmur heard. Irreg rhythm.  Pulmonary/Chest: Effort normal and breath sounds normal. No respiratory distress. She has no wheezes. She has no rales.  Abdominal: Soft. Bowel sounds are normal. She exhibits no distension and no mass. There is  no tenderness. There is no guarding.  Musculoskeletal: She exhibits tenderness. She exhibits no edema.  Arthritic changes most notable in hands, wrists, knees and feet. R upper lateral thigh has fading bruise; firm 3-4 cm subcutaneous mass in area of previous large bruise.  Lymphadenopathy:    She has no cervical adenopathy.  Neurological: She is alert and oriented to person, place, and time. She has normal reflexes. No cranial nerve deficit. She exhibits normal muscle tone. Coordination  normal.  Skin: Skin is warm and dry.  Skin turgor is fair w/ paper-thin integrity. Lower ext w/ ropey varicose veins. Several vascular lesion (<5 mm) and Seb Ks on trunk/back- pt sees a DERM regularly.  Psychiatric: She has a normal mood and affect. Her behavior is normal. Judgment and thought content normal.    Results for orders placed in visit on 04/20/12  POCT URINALYSIS DIPSTICK      Result Value Range   Color, UA yellow     Clarity, UA clear     Glucose, UA neg     Bilirubin, UA neg     Ketones, UA trace     Spec Grav, UA 1.020     Blood, UA trace     pH, UA 6.5     Protein, UA neg     Urobilinogen, UA 0.2     Nitrite, UA neg     Leukocytes, UA small (1+)         Assessment & Plan:  Routine general medical examination at a health care facility - Plan: Follow-up w/ Specialists  Multiple falls- have advised pt have PT assessment for "Falls Risk"; she will "think about it". She will call if she feels the problem is getting worse.  Superficial varicosities, bilateral- pt will try to purchase some support hose that she can get on and off with ease.

## 2012-04-20 NOTE — Patient Instructions (Addendum)
For Healthier Skin- get an over-the-counter Magnesium and Zinc oral supplement to take along with your multivitamin and Vivactiv. Vitamin C is important for skin integrity; you can get an oral supplement (like in a chewy vitamin, lozenge or powder to add to liquid).  Also get a topical oil called Bio-Oil which you can add to your favorite body cream or just use it alone to help keep your skin healthy. This may prevent future skin tears and help healing.  Good nutrition is most important so make sure to get in enough calories every day; this includes healthy sources of protein.   Whenyou come back in 3 months, we will decide if you need to go to physical therapy for assessment for frequent falls.

## 2012-04-25 ENCOUNTER — Ambulatory Visit (INDEPENDENT_AMBULATORY_CARE_PROVIDER_SITE_OTHER): Payer: Medicare Other | Admitting: Emergency Medicine

## 2012-04-25 VITALS — BP 102/60 | HR 58 | Temp 97.8°F | Resp 16 | Ht 62.0 in | Wt 106.0 lb

## 2012-04-25 DIAGNOSIS — Z4800 Encounter for change or removal of nonsurgical wound dressing: Secondary | ICD-10-CM

## 2012-04-25 DIAGNOSIS — Z4889 Encounter for other specified surgical aftercare: Secondary | ICD-10-CM

## 2012-04-25 NOTE — Patient Instructions (Signed)

## 2012-04-25 NOTE — Progress Notes (Signed)
[facility] [address] [place] [address] [phone]  Date:  04/25/2012   Name:  Vanessa [name]   DOB:  1920/08/23   MRN:  [id number]  PCP:  Cassell Clement, MD    Chief Complaint: Hand Injury   History of Present Illness:  Vanessa [name] is a 79 y.o. very pleasant female patient who presents with the following:  2 weeks after steristrip wound left hand.   No sequelae.  No infection.    Patient Active Problem List  Diagnosis  . Atrial fibrillation  . Neuropathy  . Carotid artery occlusion  . Hypercholesterolemia  . Scoliosis  . Hoarseness  . Headache  . Dizziness  . Abdominal bloating  . Bruises easily  . Pruritus  . Raynaud phenomenon  . Osteoarthritis  . Chest pain  . Leg ulcer  . Pain, ankle  . Ankle ulcer  . Malaise and fatigue  . Varicose veins of lower extremities with other complications  . Hematoma    Past Medical History  Diagnosis Date  . Atrial fibrillation   . Neuropathy     PERIPHERAL  . Carotid artery occlusion     LEFT  . Scoliosis   . Hoarseness   . Headache   . Dizziness   . Abdominal bloating   . Bruises easily   . Pruritus   . Hypercholesterolemia     Past Surgical History  Procedure Laterality Date  . Abdominal hysterectomy    . Spine surgery  1997  . Gallbladder surgery      History  Substance Use Topics  . Smoking status: Never Smoker   . Smokeless tobacco: Never Used  . Alcohol Use: No    Family History  Problem Relation Age of Onset  . Heart disease    . Diabetes Mother   . Heart attack Mother   . Heart disease Mother   . Heart attack Father   . Stroke Father   . Cancer Sister   . Heart disease Maternal Grandmother     Allergies  Allergen Reactions  . Tape Other (See Comments)    Tears skin.    Medication list has been reviewed and updated.  Current Outpatient Prescriptions on File Prior to Visit  Medication Sig Dispense Refill  . atenolol (TENORMIN) 25 MG tablet  Take 1 tablet (25 mg total) by mouth 2 (two) times daily.  60 tablet  11  . BIOTIN 5000 PO Take by mouth daily.      . Calcium-Vitamin D-Vitamin K (VIACTIV PO) Take by mouth.      . Emollient (EUCERIN) lotion Apply topically as needed.      . Multiple Vitamin (MULTIVITAMIN) tablet Take 1 tablet by mouth daily.        Marland Kitchen OVER THE COUNTER MEDICATION Cetaphil Cream for skin      . simvastatin (ZOCOR) 40 MG tablet Take 0.5 tablets (20 mg total) by mouth daily.  30 tablet  5  . traMADol (ULTRAM) 50 MG tablet Take 1-2 tablets (50-100 mg total) by mouth every 6 (six) hours as needed for pain.  50 tablet  0  . triamcinolone (KENALOG) 0.1 % cream Apply topically as needed.        . warfarin (COUMADIN) 2 MG tablet Take 1 tablet (2 mg total) by mouth as directed.  90 tablet  1   No current facility-administered medications on file prior to visit.    Review of Systems:  As per HPI, otherwise  negative.    Physical Examination: Filed Vitals:   04/25/12 1103  BP: 102/60  Pulse: 58  Temp: 97.8 F (36.6 C)  Resp: 16   Filed Vitals:   04/25/12 1103  Height: 5\' 2"  (1.575 m)  Weight: 106 lb (48.081 kg)   Body mass index is 19.38 kg/(m^2). Ideal Body Weight: Weight in (lb) to have BMI = 25: 136.4   GEN: WDWN, NAD, Non-toxic, Alert & Oriented x 3 HEENT: Atraumatic, Normocephalic.  Ears and Nose: No external deformity. EXTR: No clubbing/cyanosis/edema NEURO: Normal gait.  PSYCH: Normally interactive. Conversant. Not depressed or anxious appearing.  Calm demeanor.  Healed wound  Assessment and Plan: steristrips removed  Follow up as needed   Signed,  Phillips Odor, MD

## 2012-04-30 ENCOUNTER — Encounter: Payer: Self-pay | Admitting: Emergency Medicine

## 2012-04-30 ENCOUNTER — Ambulatory Visit (INDEPENDENT_AMBULATORY_CARE_PROVIDER_SITE_OTHER): Payer: Medicare Other | Admitting: Emergency Medicine

## 2012-04-30 VITALS — BP 168/80 | HR 87 | Temp 97.8°F | Resp 16 | Ht 61.0 in | Wt 115.6 lb

## 2012-04-30 DIAGNOSIS — Z4889 Encounter for other specified surgical aftercare: Secondary | ICD-10-CM

## 2012-04-30 NOTE — Progress Notes (Signed)
[facility] [address] [place] [address] [phone]  Date:  04/30/2012   Name:  Vanessa [name]   DOB:  April 16, 1920   MRN:  [id number]  PCP:  Cassell Clement, MD    Chief Complaint: Follow-up   History of Present Illness:  Vanessa [name] is a 79 y.o. very pleasant female patient who presents with the following:  Follow up for wound on her left hand.  No fever or chills.  No drainage.  Patient Active Problem List  Diagnosis  . Atrial fibrillation  . Neuropathy  . Carotid artery occlusion  . Hypercholesterolemia  . Scoliosis  . Hoarseness  . Headache  . Dizziness  . Abdominal bloating  . Bruises easily  . Pruritus  . Raynaud phenomenon  . Osteoarthritis  . Chest pain  . Leg ulcer  . Pain, ankle  . Ankle ulcer  . Malaise and fatigue  . Varicose veins of lower extremities with other complications  . Hematoma    Past Medical History  Diagnosis Date  . Atrial fibrillation   . Neuropathy     PERIPHERAL  . Carotid artery occlusion     LEFT  . Scoliosis   . Hoarseness   . Headache   . Dizziness   . Abdominal bloating   . Bruises easily   . Pruritus   . Hypercholesterolemia     Past Surgical History  Procedure Laterality Date  . Abdominal hysterectomy    . Spine surgery  1997  . Gallbladder surgery      History  Substance Use Topics  . Smoking status: Never Smoker   . Smokeless tobacco: Never Used  . Alcohol Use: No    Family History  Problem Relation Age of Onset  . Heart disease    . Diabetes Mother   . Heart attack Mother   . Heart disease Mother   . Heart attack Father   . Stroke Father   . Cancer Sister   . Heart disease Maternal Grandmother     Allergies  Allergen Reactions  . Tape Other (See Comments)    Tears skin.    Medication list has been reviewed and updated.  Current Outpatient Prescriptions on File Prior to Visit  Medication Sig Dispense Refill  . atenolol (TENORMIN) 25 MG tablet  Take 1 tablet (25 mg total) by mouth 2 (two) times daily.  60 tablet  11  . BIOTIN 5000 PO Take by mouth daily.      . Calcium-Vitamin D-Vitamin K (VIACTIV PO) Take by mouth.      . Emollient (EUCERIN) lotion Apply topically as needed.      . Multiple Vitamin (MULTIVITAMIN) tablet Take 1 tablet by mouth daily.        Marland Kitchen OVER THE COUNTER MEDICATION Cetaphil Cream for skin      . simvastatin (ZOCOR) 40 MG tablet Take 0.5 tablets (20 mg total) by mouth daily.  30 tablet  5  . traMADol (ULTRAM) 50 MG tablet Take 1-2 tablets (50-100 mg total) by mouth every 6 (six) hours as needed for pain.  50 tablet  0  . triamcinolone (KENALOG) 0.1 % cream Apply topically as needed.        . warfarin (COUMADIN) 2 MG tablet Take 1 tablet (2 mg total) by mouth as directed.  90 tablet  1   No current facility-administered medications on file prior to visit.    Review of Systems:  As per HPI, otherwise negative.  Physical Examination: Filed Vitals:   04/30/12 1236  BP: 168/80  Pulse: 87  Temp: 97.8 F (36.6 C)  Resp: 16   Filed Vitals:   04/30/12 1236  Height: 5\' 1"  (1.549 m)  Weight: 115 lb 9.6 oz (52.436 kg)   Body mass index is 21.85 kg/(m^2). Ideal Body Weight: Weight in (lb) to have BMI = 25: 132   GEN: WDWN, NAD, Non-toxic, Alert & Oriented x 3 HEENT: Atraumatic, Normocephalic.  Ears and Nose: No external deformity. EXTR: No clubbing/cyanosis/edema NEURO: Normal gait.  PSYCH: Normally interactive. Conversant. Not depressed or anxious appearing.  Calm demeanor.  Left Hand:  Wound healing well no infection  Assessment and Plan: Wound check   Signed,  Phillips Odor, MD

## 2012-04-30 NOTE — Patient Instructions (Addendum)

## 2012-05-02 ENCOUNTER — Telehealth: Payer: Self-pay

## 2012-05-02 NOTE — Telephone Encounter (Signed)
We have rx'd Zinc for this pt. She has been "googling" and is concerned that she should not be taking zinc with her blood thinner. Also uses cough drops with zinc occasionally is concerned about this also.  Home  [phone] or cell [phone]

## 2012-05-02 NOTE — Telephone Encounter (Signed)
Ok to d/c zinc

## 2012-05-03 NOTE — Telephone Encounter (Signed)
Spoke to pt about discontinuing her zinc- she was concerned about a side effect of fatigue with taking both a blood thinner and zinc- per Lanora Manis ok to stop taking it. Pt reports no complaints or adverse effects. She was just concerned.

## 2012-05-05 ENCOUNTER — Ambulatory Visit (INDEPENDENT_AMBULATORY_CARE_PROVIDER_SITE_OTHER): Payer: Medicare Other

## 2012-05-05 DIAGNOSIS — I4891 Unspecified atrial fibrillation: Secondary | ICD-10-CM | POA: Diagnosis not present

## 2012-05-05 LAB — POCT INR: INR: 2.4

## 2012-05-15 ENCOUNTER — Telehealth: Payer: Self-pay

## 2012-05-15 NOTE — Telephone Encounter (Signed)
Called her, Dr Audria Nine is not in the office. Patient is asking about the Zinc and the Magnesium. She wants you to call at your convenience, states there is nothing I can help with.

## 2012-05-15 NOTE — Telephone Encounter (Signed)
Patient would like to talk with Dr. Audria Nine regarding her medicines.   Best#: [phone]

## 2012-05-16 NOTE — Telephone Encounter (Signed)
Left a message for pt letting her know that I got her message and will try calling again tomorrow.

## 2012-05-17 ENCOUNTER — Telehealth: Payer: Self-pay | Admitting: Family Medicine

## 2012-05-17 NOTE — Telephone Encounter (Signed)
Pt concerned about taking zinc and magnesium tablets; she has had choking on 1/2 tablet and wants to know if tablets can be crushed. She has discussed these supplements w/ [employer] nurse and her pharmacist who reassured her that these were safe to take. I advised that she check w/ pharmacist about crushing supplements for ease of swallowing; if this is not contraindicated, pt can do that and take supplement w/ applesauce or pudding, etc. She can hold these supplements until she has checked w/ pharmacist. She understands.

## 2012-05-18 DIAGNOSIS — R39198 Other difficulties with micturition: Secondary | ICD-10-CM | POA: Diagnosis not present

## 2012-05-18 DIAGNOSIS — R82998 Other abnormal findings in urine: Secondary | ICD-10-CM | POA: Diagnosis not present

## 2012-05-22 DIAGNOSIS — L821 Other seborrheic keratosis: Secondary | ICD-10-CM | POA: Diagnosis not present

## 2012-05-22 DIAGNOSIS — Z85828 Personal history of other malignant neoplasm of skin: Secondary | ICD-10-CM | POA: Diagnosis not present

## 2012-05-22 DIAGNOSIS — D1801 Hemangioma of skin and subcutaneous tissue: Secondary | ICD-10-CM | POA: Diagnosis not present

## 2012-05-22 DIAGNOSIS — D239 Other benign neoplasm of skin, unspecified: Secondary | ICD-10-CM | POA: Diagnosis not present

## 2012-05-22 DIAGNOSIS — L739 Follicular disorder, unspecified: Secondary | ICD-10-CM | POA: Diagnosis not present

## 2012-05-22 DIAGNOSIS — I789 Disease of capillaries, unspecified: Secondary | ICD-10-CM | POA: Diagnosis not present

## 2012-05-25 DIAGNOSIS — N760 Acute vaginitis: Secondary | ICD-10-CM | POA: Diagnosis not present

## 2012-06-01 ENCOUNTER — Ambulatory Visit (INDEPENDENT_AMBULATORY_CARE_PROVIDER_SITE_OTHER): Payer: Medicare Other | Admitting: Emergency Medicine

## 2012-06-01 ENCOUNTER — Ambulatory Visit: Payer: Medicare Other

## 2012-06-01 VITALS — BP 125/82 | HR 70 | Temp 98.2°F | Resp 18 | Wt 114.0 lb

## 2012-06-01 DIAGNOSIS — M25559 Pain in unspecified hip: Secondary | ICD-10-CM

## 2012-06-01 DIAGNOSIS — M25551 Pain in right hip: Secondary | ICD-10-CM

## 2012-06-01 NOTE — Progress Notes (Signed)
  Subjective:    Patient ID: Vanessa [name], female    DOB: 23-Dec-1920, 79 y.o.   MRN: [id number]  HPI patient states she fell back in December. She sustained a large hematoma over her right hip. She has been doing well until recently when she noticed increasing pain and swelling at the previous site of her injury.    Review of Systems     Objective:   Physical Exam physical exam reveals some discoloration to the skin over the greater trochanter of the right hip. She has no pain with flexion-extension. There is a firm area underneath the skin which measures approximately 2 x 3 cm. There is no large seroma noted.  [facility] reading (PRIMARY) by  Dr. Cleta Alberts patient has had previous back surgery. There is no myositis dissecans no fracture seen around the right hip  She also has an ecchymotic area over the left elbow but full range of motion of the left elbow.      Assessment & Plan:  Patient reassured about her situation.

## 2012-06-08 ENCOUNTER — Emergency Department (HOSPITAL_COMMUNITY): Payer: Medicare Other

## 2012-06-08 ENCOUNTER — Emergency Department (HOSPITAL_COMMUNITY)
Admission: EM | Admit: 2012-06-08 | Discharge: 2012-06-09 | Disposition: A | Discharge status: Home / Self Care | Payer: Medicare Other | Attending: Emergency Medicine | Admitting: Emergency Medicine

## 2012-06-08 ENCOUNTER — Encounter (HOSPITAL_COMMUNITY): Payer: Self-pay | Admitting: Emergency Medicine

## 2012-06-08 DIAGNOSIS — Z79899 Other long term (current) drug therapy: Secondary | ICD-10-CM | POA: Diagnosis not present

## 2012-06-08 DIAGNOSIS — S0003XA Contusion of scalp, initial encounter: Secondary | ICD-10-CM | POA: Insufficient documentation

## 2012-06-08 DIAGNOSIS — S6990XA Unspecified injury of unspecified wrist, hand and finger(s), initial encounter: Secondary | ICD-10-CM | POA: Diagnosis not present

## 2012-06-08 DIAGNOSIS — E7800 Pure hypercholesterolemia, unspecified: Secondary | ICD-10-CM | POA: Diagnosis not present

## 2012-06-08 DIAGNOSIS — S62639A Displaced fracture of distal phalanx of unspecified finger, initial encounter for closed fracture: Secondary | ICD-10-CM | POA: Insufficient documentation

## 2012-06-08 DIAGNOSIS — Z8669 Personal history of other diseases of the nervous system and sense organs: Secondary | ICD-10-CM | POA: Insufficient documentation

## 2012-06-08 DIAGNOSIS — Z8719 Personal history of other diseases of the digestive system: Secondary | ICD-10-CM | POA: Insufficient documentation

## 2012-06-08 DIAGNOSIS — Z8739 Personal history of other diseases of the musculoskeletal system and connective tissue: Secondary | ICD-10-CM | POA: Insufficient documentation

## 2012-06-08 DIAGNOSIS — W108XXA Fall (on) (from) other stairs and steps, initial encounter: Secondary | ICD-10-CM | POA: Insufficient documentation

## 2012-06-08 DIAGNOSIS — IMO0002 Reserved for concepts with insufficient information to code with codable children: Secondary | ICD-10-CM

## 2012-06-08 DIAGNOSIS — S6292XA Unspecified fracture of left wrist and hand, initial encounter for closed fracture: Secondary | ICD-10-CM

## 2012-06-08 DIAGNOSIS — Z7901 Long term (current) use of anticoagulants: Secondary | ICD-10-CM | POA: Diagnosis not present

## 2012-06-08 DIAGNOSIS — S0180XA Unspecified open wound of other part of head, initial encounter: Secondary | ICD-10-CM | POA: Insufficient documentation

## 2012-06-08 DIAGNOSIS — S0510XA Contusion of eyeball and orbital tissues, unspecified eye, initial encounter: Secondary | ICD-10-CM | POA: Diagnosis not present

## 2012-06-08 DIAGNOSIS — S60219A Contusion of unspecified wrist, initial encounter: Secondary | ICD-10-CM | POA: Insufficient documentation

## 2012-06-08 DIAGNOSIS — S0993XA Unspecified injury of face, initial encounter: Secondary | ICD-10-CM | POA: Diagnosis not present

## 2012-06-08 DIAGNOSIS — I4891 Unspecified atrial fibrillation: Secondary | ICD-10-CM | POA: Diagnosis not present

## 2012-06-08 DIAGNOSIS — T148XXA Other injury of unspecified body region, initial encounter: Secondary | ICD-10-CM

## 2012-06-08 DIAGNOSIS — S59909A Unspecified injury of unspecified elbow, initial encounter: Secondary | ICD-10-CM | POA: Diagnosis not present

## 2012-06-08 DIAGNOSIS — S298XXA Other specified injuries of thorax, initial encounter: Secondary | ICD-10-CM | POA: Diagnosis not present

## 2012-06-08 DIAGNOSIS — Z872 Personal history of diseases of the skin and subcutaneous tissue: Secondary | ICD-10-CM | POA: Diagnosis not present

## 2012-06-08 DIAGNOSIS — S59919A Unspecified injury of unspecified forearm, initial encounter: Secondary | ICD-10-CM | POA: Diagnosis not present

## 2012-06-08 DIAGNOSIS — S064X9A Epidural hemorrhage with loss of consciousness of unspecified duration, initial encounter: Secondary | ICD-10-CM | POA: Diagnosis not present

## 2012-06-08 DIAGNOSIS — T1490XA Injury, unspecified, initial encounter: Secondary | ICD-10-CM | POA: Diagnosis not present

## 2012-06-08 DIAGNOSIS — Y9389 Activity, other specified: Secondary | ICD-10-CM | POA: Insufficient documentation

## 2012-06-08 DIAGNOSIS — Y929 Unspecified place or not applicable: Secondary | ICD-10-CM | POA: Insufficient documentation

## 2012-06-08 LAB — PROTIME-INR
INR: 3.28 — ABNORMAL HIGH (ref 0.00–1.49)
Prothrombin Time: 31.6 seconds — ABNORMAL HIGH (ref 11.6–15.2)

## 2012-06-08 LAB — CBC WITH DIFFERENTIAL/PLATELET
Basophils Absolute: 0 10*3/uL (ref 0.0–0.1)
Basophils Relative: 0 % (ref 0–1)
Eosinophils Absolute: 0 10*3/uL (ref 0.0–0.7)
Eosinophils Relative: 0 % (ref 0–5)
HCT: 37 % (ref 36.0–46.0)
Hemoglobin: 12.7 g/dL (ref 12.0–15.0)
Lymphocytes Relative: 13 % (ref 12–46)
Lymphs Abs: 1.4 10*3/uL (ref 0.7–4.0)
MCH: 32.2 pg (ref 26.0–34.0)
MCHC: 34.3 g/dL (ref 30.0–36.0)
MCV: 93.7 fL (ref 78.0–100.0)
Monocytes Absolute: 1 10*3/uL (ref 0.1–1.0)
Monocytes Relative: 10 % (ref 3–12)
Neutro Abs: 8.2 10*3/uL — ABNORMAL HIGH (ref 1.7–7.7)
Neutrophils Relative %: 77 % (ref 43–77)
Platelets: 216 10*3/uL (ref 150–400)
RBC: 3.95 MIL/uL (ref 3.87–5.11)
RDW: 14.7 % (ref 11.5–15.5)
WBC: 10.6 10*3/uL — ABNORMAL HIGH (ref 4.0–10.5)

## 2012-06-08 NOTE — ED Notes (Addendum)
Per ems-- pt from home. Pt reports she fell over some steps earlier today with hematoma above left eye that was bleeding. Pt also reports pain to L wrist with swelling and bruising. Pt taking blood thinners. No loc. No back pain or sob. Pt is a&ox4.

## 2012-06-08 NOTE — ED Provider Notes (Signed)
History     CSN: [id number]  Arrival date & time 06/08/12  2258   First MD Initiated Contact with Patient 06/08/12 2304      Chief Complaint  Patient presents with  . Fall    (Consider location/radiation/quality/duration/timing/severity/associated sxs/prior treatment) Patient is a 79 y.o. female presenting with fall. The history is provided by the patient.  Fall This is a new problem. The current episode started 1 to 2 hours ago. The problem occurs constantly. The problem has not changed since onset.Pertinent negatives include no chest pain, no abdominal pain, no headaches and no shortness of breath. Nothing aggravates the symptoms. Nothing relieves the symptoms. She has tried nothing for the symptoms. The treatment provided no relief.    Past Medical History  Diagnosis Date  . Atrial fibrillation   . Neuropathy     PERIPHERAL  . Carotid artery occlusion     LEFT  . Scoliosis   . Hoarseness   . Headache(784.0)   . Dizziness   . Abdominal bloating   . Bruises easily   . Pruritus   . Hypercholesterolemia     Past Surgical History  Procedure Laterality Date  . Abdominal hysterectomy    . Spine surgery  1997  . Gallbladder surgery      Family History  Problem Relation Age of Onset  . Heart disease    . Diabetes Mother   . Heart attack Mother   . Heart disease Mother   . Heart attack Father   . Stroke Father   . Cancer Sister   . Heart disease Maternal Grandmother     History  Substance Use Topics  . Smoking status: Never Smoker   . Smokeless tobacco: Never Used  . Alcohol Use: No    OB History   Grav Para Term Preterm Abortions TAB SAB Ect Mult Living                  Review of Systems  Respiratory: Negative for shortness of breath.   Cardiovascular: Negative for chest pain.  Gastrointestinal: Negative for abdominal pain.  Neurological: Negative for headaches.  All other systems reviewed and are negative.    Allergies  Tape  Home  Medications   Current Outpatient Rx  Name  Route  Sig  Dispense  Refill  . atenolol (TENORMIN) 25 MG tablet   Oral   Take 1 tablet (25 mg total) by mouth 2 (two) times daily.   60 tablet   11   . BIOTIN 5000 PO   Oral   Take by mouth daily.         . Calcium-Vitamin D-Vitamin K (VIACTIV PO)   Oral   Take by mouth.         . Emollient (EUCERIN) lotion   Topical   Apply topically as needed.         . magnesium 30 MG tablet   Oral   Take 30 mg by mouth 2 (two) times daily.         . Multiple Vitamin (MULTIVITAMIN) tablet   Oral   Take 1 tablet by mouth daily.           . simvastatin (ZOCOR) 40 MG tablet   Oral   Take 0.5 tablets (20 mg total) by mouth daily.   30 tablet   5   . traMADol (ULTRAM) 50 MG tablet   Oral   Take 1-2 tablets (50-100 mg total) by mouth every 6 (six) hours as needed  for pain.   50 tablet   0   . triamcinolone (KENALOG) 0.1 % cream   Topical   Apply topically as needed.           . warfarin (COUMADIN) 2 MG tablet   Oral   Take 1 tablet (2 mg total) by mouth as directed.   90 tablet   1     90 tabs is 2 mth supply     BP 169/85  Pulse 62  Temp(Src) 97.6 F (36.4 C) (Oral)  Resp 24  SpO2 100%  Physical Exam  Constitutional: She appears well-developed and well-nourished.  HENT:  Head: Head is without raccoon's eyes and without Battle's sign.    Right Ear: No hemotympanum.  Left Ear: No hemotympanum.  Mouth/Throat: Oropharynx is clear and moist.  Eyes: Conjunctivae and EOM are normal. Pupils are equal, round, and reactive to light.  Neck: Normal range of motion. Neck supple. No tracheal deviation present.  Cardiovascular: Normal rate, regular rhythm and intact distal pulses.   Pulmonary/Chest: Effort normal and breath sounds normal. She has no wheezes. She has no rales.  Abdominal: Soft. There is no tenderness. There is no rebound and no guarding.  Musculoskeletal: Normal range of motion. She exhibits no edema.    bruising and pain of the left wrist  Neurological: She is alert. She has normal reflexes.  Skin: Skin is warm and dry.  Psychiatric: She has a normal mood and affect.    ED Course  Procedures (including critical care time)  Labs Reviewed  CBC WITH DIFFERENTIAL  PROTIME-INR   No results found.   No diagnosis found.    MDM  LACERATION REPAIR Performed by: Jasmine Awe Authorized by: Jasmine Awe Consent: Verbal consent obtained. Risks and benefits: risks, benefits and alternatives were discussed Consent given by: patient Patient identity confirmed: provided demographic data Prepped and Draped in normal sterile fashion Wound explored  Laceration Location: left forehead  Laceration Length: .5 cm  No Foreign Bodies seen or palpated  Irrigation method: syringe Amount of cleaning: standard  Skin closure: dermabond  Patient tolerance: Patient tolerated the procedure well with no immediate complications.      Follow up with your doctor in am and hand surgery next week regarding fracture of hand   Farhaan Mabee K Messi Twedt-Rasch, MD 06/09/12 0139

## 2012-06-09 ENCOUNTER — Ambulatory Visit: Payer: Medicare Other | Admitting: Cardiology

## 2012-06-09 ENCOUNTER — Other Ambulatory Visit: Payer: Medicare Other

## 2012-06-09 ENCOUNTER — Emergency Department (HOSPITAL_COMMUNITY): Payer: Medicare Other

## 2012-06-09 DIAGNOSIS — S0510XA Contusion of eyeball and orbital tissues, unspecified eye, initial encounter: Secondary | ICD-10-CM | POA: Diagnosis not present

## 2012-06-09 DIAGNOSIS — S6990XA Unspecified injury of unspecified wrist, hand and finger(s), initial encounter: Secondary | ICD-10-CM | POA: Diagnosis not present

## 2012-06-09 DIAGNOSIS — S0993XA Unspecified injury of face, initial encounter: Secondary | ICD-10-CM | POA: Diagnosis not present

## 2012-06-09 DIAGNOSIS — S298XXA Other specified injuries of thorax, initial encounter: Secondary | ICD-10-CM | POA: Diagnosis not present

## 2012-06-09 DIAGNOSIS — S59909A Unspecified injury of unspecified elbow, initial encounter: Secondary | ICD-10-CM | POA: Diagnosis not present

## 2012-06-09 LAB — POCT I-STAT, CHEM 8
BUN: 17 mg/dL (ref 6–23)
Calcium, Ion: 1.21 mmol/L (ref 1.13–1.30)
Chloride: 104 mEq/L (ref 96–112)
Creatinine, Ser: 0.7 mg/dL (ref 0.50–1.10)
Glucose, Bld: 120 mg/dL — ABNORMAL HIGH (ref 70–99)
HCT: 40 % (ref 36.0–46.0)
Hemoglobin: 13.6 g/dL (ref 12.0–15.0)
Potassium: 3.9 mEq/L (ref 3.5–5.1)
Sodium: 140 mEq/L (ref 135–145)
TCO2: 29 mmol/L (ref 0–100)

## 2012-06-09 MED ORDER — TRAMADOL HCL 50 MG PO TABS: 50.0000 mg | ORAL_TABLET | Freq: Four times a day (QID) | ORAL | Status: DC | PRN

## 2012-06-09 MED ORDER — TRAMADOL HCL 50 MG PO TABS
50.0000 mg | ORAL_TABLET | Freq: Once | ORAL | Status: DC
  Filled 2012-06-09: qty 1

## 2012-06-09 NOTE — ED Notes (Signed)
Pt. Will call neighbor in the morning to take her home.

## 2012-06-09 NOTE — Progress Notes (Signed)
Orthopedic Tech Progress Note Patient Details:  Vanessa [name] [birth year] [id number]  Ortho Devices Type of Ortho Device: Ulna gutter splint   Haskell Flirt 06/09/2012, 1:36 AM

## 2012-06-13 ENCOUNTER — Telehealth: Payer: Self-pay | Admitting: Pharmacist

## 2012-06-13 DIAGNOSIS — S1093XA Contusion of unspecified part of neck, initial encounter: Secondary | ICD-10-CM | POA: Diagnosis not present

## 2012-06-13 DIAGNOSIS — M169 Osteoarthritis of hip, unspecified: Secondary | ICD-10-CM | POA: Diagnosis not present

## 2012-06-13 DIAGNOSIS — S0003XA Contusion of scalp, initial encounter: Secondary | ICD-10-CM | POA: Diagnosis not present

## 2012-06-13 NOTE — Telephone Encounter (Signed)
Pt called to report fall last Thursday.  She went to ER and INR was 3.3.   She did not take her Coumadin on 5/29 nor 6/1.  She was scheduled to check INR on 5/30 as well as Dr. Patty Sermons.  Will have her continue current dose of Coumadin as INR is normally within range.  Rescheduled Dr. Yevonne Pax appt to 6/12.

## 2012-06-16 ENCOUNTER — Ambulatory Visit (INDEPENDENT_AMBULATORY_CARE_PROVIDER_SITE_OTHER): Payer: Medicare Other | Admitting: Emergency Medicine

## 2012-06-16 ENCOUNTER — Ambulatory Visit: Payer: Medicare Other

## 2012-06-16 VITALS — BP 108/64 | HR 85 | Temp 98.0°F | Resp 17 | Ht 62.5 in | Wt 117.0 lb

## 2012-06-16 DIAGNOSIS — M79642 Pain in left hand: Secondary | ICD-10-CM

## 2012-06-16 DIAGNOSIS — M79609 Pain in unspecified limb: Secondary | ICD-10-CM | POA: Diagnosis not present

## 2012-06-16 DIAGNOSIS — T07XXXA Unspecified multiple injuries, initial encounter: Secondary | ICD-10-CM

## 2012-06-16 NOTE — Patient Instructions (Addendum)
Contusion A contusion is a deep bruise. Contusions are the result of an injury that caused bleeding under the skin. The contusion may turn blue, purple, or yellow. Minor injuries will give you a painless contusion, but more severe contusions may stay painful and swollen for a few weeks.  CAUSES  A contusion is usually caused by a blow, trauma, or direct force to an area of the body. SYMPTOMS   Swelling and redness of the injured area.  Bruising of the injured area.  Tenderness and soreness of the injured area.  Pain. DIAGNOSIS  The diagnosis can be made by taking a history and physical exam. An X-ray, CT scan, or MRI may be needed to determine if there were any associated injuries, such as fractures. TREATMENT  Specific treatment will depend on what area of the body was injured. In general, the best treatment for a contusion is resting, icing, elevating, and applying cold compresses to the injured area. Over-the-counter medicines may also be recommended for pain control. Ask your caregiver what the best treatment is for your contusion. HOME CARE INSTRUCTIONS   Put ice on the injured area.  Put ice in a plastic bag.  Place a towel between your skin and the bag.  Leave the ice on for 15-20 minutes, 3-4 times a day.  Only take over-the-counter or prescription medicines for pain, discomfort, or fever as directed by your caregiver. Your caregiver may recommend avoiding anti-inflammatory medicines (aspirin, ibuprofen, and naproxen) for 48 hours because these medicines may increase bruising.  Rest the injured area.  If possible, elevate the injured area to reduce swelling. SEEK IMMEDIATE MEDICAL CARE IF:   You have increased bruising or swelling.  You have pain that is getting worse.  Your swelling or pain is not relieved with medicines. MAKE SURE YOU:   Understand these instructions.  Will watch your condition.  Will get help right away if you are not doing well or get  worse. Document Released: 10/07/2004 Document Revised: 03/22/2011 Document Reviewed: 11/02/2010 [employer] Patient Information 2014 [employer].  

## 2012-06-16 NOTE — Progress Notes (Signed)
[facility] [address] [place] [address] [id number]  Date:  06/16/2012   Name:  Vanessa [name]   DOB:  [birth year]   MRN:  [id number]  PCP:  Cassell Clement, MD    Chief Complaint: Follow-up   History of Present Illness:  Vanessa [name] is a 79 y.o. very pleasant female patient who presents with the following:  Injured in a fall and seen in ER and in follow up by ortho.  Has persistent hematoma left forehead and pain in left hand and fifth finger.  Has pain in the hand despite the splint.  Draining blood from hematoma on forehead.  Patient Active Problem List   Diagnosis Date Noted  . Hematoma 02/08/2012  . Varicose veins of lower extremities with other complications 12/22/2011  . Malaise and fatigue 10/06/2011  . Pain, ankle 09/09/2011  . Ankle ulcer 09/09/2011  . Leg ulcer 08/30/2011  . Chest pain 04/02/2011  . Raynaud phenomenon 06/26/2010  . Osteoarthritis 06/26/2010  . Atrial fibrillation   . Neuropathy   . Carotid artery occlusion   . Hypercholesterolemia   . Scoliosis   . Hoarseness   . Headache   . Dizziness   . Abdominal bloating   . Bruises easily   . Pruritus     Past Medical History  Diagnosis Date  . Atrial fibrillation   . Neuropathy     PERIPHERAL  . Carotid artery occlusion     LEFT  . Scoliosis   . Hoarseness   . Headache(784.0)   . Dizziness   . Abdominal bloating   . Bruises easily   . Pruritus   . Hypercholesterolemia     Past Surgical History  Procedure Laterality Date  . Abdominal hysterectomy    . Spine surgery  1997  . Gallbladder surgery      History  Substance Use Topics  . Smoking status: Never Smoker   . Smokeless tobacco: Never Used  . Alcohol Use: No    Family History  Problem Relation Age of Onset  . Heart disease    . Diabetes Mother   . Heart attack Mother   . Heart disease Mother   . Heart attack Father   . Stroke Father   . Cancer Sister   . Heart disease Maternal  Grandmother     Allergies  Allergen Reactions  . Tape Other (See Comments)    Tears skin.    Medication list has been reviewed and updated.  Current Outpatient Prescriptions on File Prior to Visit  Medication Sig Dispense Refill  . atenolol (TENORMIN) 25 MG tablet Take 1 tablet (25 mg total) by mouth 2 (two) times daily.  60 tablet  11  . BIOTIN 5000 PO Take by mouth daily.      . Calcium-Vitamin D-Vitamin K (VIACTIV PO) Take by mouth.      . Emollient (EUCERIN) lotion Apply topically as needed.      . magnesium 30 MG tablet Take 30 mg by mouth 2 (two) times daily.      . Multiple Vitamin (MULTIVITAMIN) tablet Take 1 tablet by mouth daily.        . simvastatin (ZOCOR) 40 MG tablet Take 0.5 tablets (20 mg total) by mouth daily.  30 tablet  5  . traMADol (ULTRAM) 50 MG tablet Take 1-2 tablets (50-100 mg total) by mouth every 6 (six) hours as needed for pain.  50 tablet  0  . traMADol (ULTRAM) 50 MG  tablet Take 1 tablet (50 mg total) by mouth every 6 (six) hours as needed for pain.  15 tablet  0  . triamcinolone (KENALOG) 0.1 % cream Apply topically as needed.        . warfarin (COUMADIN) 2 MG tablet Take 1 tablet (2 mg total) by mouth as directed.  90 tablet  1   No current facility-administered medications on file prior to visit.    Review of Systems:  As per HPI, otherwise negative.    Physical Examination: Filed Vitals:   06/16/12 0947  BP: 108/64  Pulse: 85  Temp: 98 F (36.7 C)  Resp: 17   Filed Vitals:   06/16/12 0947  Height: 5' 2.5" (1.588 m)  Weight: 117 lb (53.071 kg)   Body mass index is 21.05 kg/(m^2). Ideal Body Weight: Weight in (lb) to have BMI = 25: 138.6   GEN: WDWN, NAD, Non-toxic, Alert & Oriented x 3 HEENT: hematoma left forehead, Normocephalic.  Ears and Nose: No external deformity. EXTR: No clubbing/cyanosis.  Has tenderness in base of fifth finger and fifth MC NEURO: Normal gait.  PSYCH: Normally interactive. Conversant. Not depressed or  anxious appearing.  Calm demeanor.    Assessment and Plan: Multiple contusions Wrist sprain Hematoma face  Signed,  Phillips Odor, MD   [facility] reading (PRIMARY) by  Dr. Dareen Piano.  No osseous injury.  Rather extensive arthritis.

## 2012-06-18 ENCOUNTER — Ambulatory Visit (INDEPENDENT_AMBULATORY_CARE_PROVIDER_SITE_OTHER): Payer: Medicare Other | Admitting: Emergency Medicine

## 2012-06-18 VITALS — BP 114/78 | HR 92 | Temp 98.0°F | Resp 17 | Ht 63.0 in | Wt 116.0 lb

## 2012-06-18 DIAGNOSIS — M79609 Pain in unspecified limb: Secondary | ICD-10-CM | POA: Diagnosis not present

## 2012-06-18 DIAGNOSIS — T07XXXA Unspecified multiple injuries, initial encounter: Secondary | ICD-10-CM

## 2012-06-18 NOTE — Progress Notes (Signed)
[facility] [address], [place] [address] [phone]  Date:  06/18/2012   Name:  Vanessa [name]   DOB:  1920/02/14   MRN:  [id number]  PCP:  Cassell Clement, MD    Chief Complaint: Follow-up   History of Present Illness:  Vanessa [name] is a 79 y.o. very pleasant female patient who presents with the following:  Follow up following fall. Concerned that her hematoma on her forehead has continued to drain at night.  No fever or chills, no neuro or visual symptoms.  Ambulating normally.  No improvement with over the counter medications or other home remedies. Denies other complaint or health concern today.   Patient Active Problem List   Diagnosis Date Noted  . Hematoma 02/08/2012  . Varicose veins of lower extremities with other complications 12/22/2011  . Malaise and fatigue 10/06/2011  . Pain, ankle 09/09/2011  . Ankle ulcer 09/09/2011  . Leg ulcer 08/30/2011  . Chest pain 04/02/2011  . Raynaud phenomenon 06/26/2010  . Osteoarthritis 06/26/2010  . Atrial fibrillation   . Neuropathy   . Carotid artery occlusion   . Hypercholesterolemia   . Scoliosis   . Hoarseness   . Headache   . Dizziness   . Abdominal bloating   . Bruises easily   . Pruritus     Past Medical History  Diagnosis Date  . Atrial fibrillation   . Neuropathy     PERIPHERAL  . Carotid artery occlusion     LEFT  . Scoliosis   . Hoarseness   . Headache(784.0)   . Dizziness   . Abdominal bloating   . Bruises easily   . Pruritus   . Hypercholesterolemia     Past Surgical History  Procedure Laterality Date  . Abdominal hysterectomy    . Spine surgery  1997  . Gallbladder surgery      History  Substance Use Topics  . Smoking status: Never Smoker   . Smokeless tobacco: Never Used  . Alcohol Use: No    Family History  Problem Relation Age of Onset  . Heart disease    . Diabetes Mother   . Heart attack Mother   . Heart disease Mother   . Heart attack  Father   . Stroke Father   . Cancer Sister   . Heart disease Maternal Grandmother     Allergies  Allergen Reactions  . Tape Other (See Comments)    Tears skin.    Medication list has been reviewed and updated.  Current Outpatient Prescriptions on File Prior to Visit  Medication Sig Dispense Refill  . alum & mag hydroxide-simeth (GELUSIL) 200-200-25 MG suspension Chew by mouth every 6 (six) hours as needed for indigestion.      Marland Kitchen atenolol (TENORMIN) 25 MG tablet Take 1 tablet (25 mg total) by mouth 2 (two) times daily.  60 tablet  11  . BIOTIN 5000 PO Take by mouth daily.      . Calcium-Vitamin D-Vitamin K (VIACTIV PO) Take by mouth.      . Emollient (EUCERIN) lotion Apply topically as needed.      . magnesium 30 MG tablet Take 30 mg by mouth 2 (two) times daily.      . Multiple Vitamin (MULTIVITAMIN) tablet Take 1 tablet by mouth daily.        . simvastatin (ZOCOR) 40 MG tablet Take 0.5 tablets (20 mg total) by mouth daily.  30 tablet  5  . traMADol (  ULTRAM) 50 MG tablet Take 1-2 tablets (50-100 mg total) by mouth every 6 (six) hours as needed for pain.  50 tablet  0  . traMADol (ULTRAM) 50 MG tablet Take 1 tablet (50 mg total) by mouth every 6 (six) hours as needed for pain.  15 tablet  0  . triamcinolone (KENALOG) 0.1 % cream Apply topically as needed.        . warfarin (COUMADIN) 2 MG tablet Take 1 tablet (2 mg total) by mouth as directed.  90 tablet  1  . zinc gluconate 50 MG tablet Take 50 mg by mouth daily.       No current facility-administered medications on file prior to visit.    Review of Systems:  As per HPI, otherwise negative.    Physical Examination: Filed Vitals:   06/18/12 1409  BP: 114/78  Pulse: 92  Temp: 98 F (36.7 C)  Resp: 17   Filed Vitals:   06/18/12 1409  Height: 5\' 3"  (1.6 m)  Weight: 116 lb (52.617 kg)   Body mass index is 20.55 kg/(m^2). Ideal Body Weight: Weight in (lb) to have BMI = 25: 140.8   GEN: WDWN, NAD, Non-toxic, Alert &  Oriented x 3 HEENT: multiple contusions face and hematoma forehead, Normocephalic.  Ears and Nose: No external deformity. EXTR: No clubbing/cyanosis/edema NEURO: Normal gait with cane.  PSYCH: Normally interactive. Conversant. Not depressed or anxious appearing.  Calm demeanor.    Assessment and Plan: Multiple contusions face and forehead Local heat  Signed,  Phillips Odor, MD

## 2012-06-22 ENCOUNTER — Telehealth: Payer: Self-pay | Admitting: Cardiology

## 2012-06-22 ENCOUNTER — Encounter: Payer: Self-pay | Admitting: Cardiology

## 2012-06-22 ENCOUNTER — Ambulatory Visit (INDEPENDENT_AMBULATORY_CARE_PROVIDER_SITE_OTHER): Payer: Medicare Other

## 2012-06-22 ENCOUNTER — Ambulatory Visit (INDEPENDENT_AMBULATORY_CARE_PROVIDER_SITE_OTHER): Payer: Medicare Other | Admitting: Cardiology

## 2012-06-22 VITALS — BP 124/66 | HR 92 | Ht 62.0 in | Wt 114.0 lb

## 2012-06-22 DIAGNOSIS — L97309 Non-pressure chronic ulcer of unspecified ankle with unspecified severity: Secondary | ICD-10-CM

## 2012-06-22 DIAGNOSIS — L97329 Non-pressure chronic ulcer of left ankle with unspecified severity: Secondary | ICD-10-CM

## 2012-06-22 DIAGNOSIS — I4891 Unspecified atrial fibrillation: Secondary | ICD-10-CM

## 2012-06-22 DIAGNOSIS — E7800 Pure hypercholesterolemia, unspecified: Secondary | ICD-10-CM

## 2012-06-22 LAB — POCT INR: INR: 2.4

## 2012-06-22 NOTE — Progress Notes (Addendum)
Vanessa [name] Date of Birth:  12-29-1920 [facility] [address] [place], [place]  [address] [phone]         Fax   [phone]  History of Present Illness: This pleasant 79 year old widowed woman is seen for a scheduled four-month followup office visit. She has a history of atypical chest pain. She was seen in March 2013 with these symptoms and underwent a nuclear stress test subsequently which showed no ischemia. The patient reports that her symptoms have improved. She recently saw Dr. Matthias Hughs who placed her on Nexium for dysphagia. She reports improvement in those symptoms since starting on Nexium. She has had a history of frequent falls.  She is on long-term Coumadin because of her chronic atrial flutter fibrillation. She has a history of vascular disease with known chronic occlusion of her left carotid artery.  She has a past history of high blood pressure.  The patient had another fall recently.  She tripped across the threshold of her home as she was returning home.  She suffered an abrasion of the left side of her face.  She went to the emergency room where x-rays were taken which were negative.  She suffered a moderate amount of blood loss from the laceration is well. Her CHADSSVASC  score is 5 (for age 77, female sex, high blood pressure, and vascular disease with occluded left internal carotid artery).   Current Outpatient Prescriptions  Medication Sig Dispense Refill  . atenolol (TENORMIN) 25 MG tablet Take 1 tablet (25 mg total) by mouth 2 (two) times daily.  60 tablet  11  . BIOTIN 5000 PO Take by mouth daily.      . Calcium-Vitamin D-Vitamin K (VIACTIV PO) Take by mouth.      . cetaphil (CETAPHIL) cream Apply topically as needed.      . magnesium 30 MG tablet Take 30 mg by mouth 2 (two) times daily.      . Multiple Vitamin (MULTIVITAMIN) tablet Take 1 tablet by mouth daily.        . simvastatin (ZOCOR) 40 MG tablet Take 0.5 tablets (20 mg total) by  mouth daily.  30 tablet  5  . traMADol (ULTRAM) 50 MG tablet Take 1 tablet (50 mg total) by mouth every 6 (six) hours as needed for pain.  15 tablet  0  . warfarin (COUMADIN) 2 MG tablet Take 1 tablet (2 mg total) by mouth as directed.  90 tablet  1  . zinc gluconate 50 MG tablet Take 50 mg by mouth daily.       No current facility-administered medications for this visit.    Allergies  Allergen Reactions  . Tape Other (See Comments)    Tears skin.    Patient Active Problem List   Diagnosis Date Noted  . Raynaud phenomenon 06/26/2010    Priority: High  . Osteoarthritis 06/26/2010    Priority: High  . Neuropathy     Priority: High  . Hematoma 02/08/2012  . Varicose veins of lower extremities with other complications 12/22/2011  . Malaise and fatigue 10/06/2011  . Pain, ankle 09/09/2011  . Ankle ulcer 09/09/2011  . Leg ulcer 08/30/2011  . Chest pain 04/02/2011  . Atrial fibrillation   . Carotid artery occlusion   . Hypercholesterolemia   . Scoliosis   . Hoarseness   . Headache   . Dizziness   . Abdominal bloating   . Bruises easily   . Pruritus     History  Smoking  status  . Never Smoker   Smokeless tobacco  . Never Used    History  Alcohol Use No    Family History  Problem Relation Age of Onset  . Heart disease    . Diabetes Mother   . Heart attack Mother   . Heart disease Mother   . Heart attack Father   . Stroke Father   . Cancer Sister   . Heart disease Maternal Grandmother     Review of Systems: Constitutional: no fever chills diaphoresis or fatigue or change in weight.  Head and neck: no hearing loss, no epistaxis, no photophobia or visual disturbance. Respiratory: No cough, shortness of breath or wheezing. Cardiovascular: No chest pain peripheral edema, palpitations. Gastrointestinal: No abdominal distention, no abdominal pain, no change in bowel habits hematochezia or melena. Genitourinary: No dysuria, no frequency, no urgency, no  nocturia. Musculoskeletal:No arthralgias, no back pain, no gait disturbance or myalgias. Neurological: No dizziness, no headaches, no numbness, no seizures, no syncope, no weakness, no tremors. Hematologic: No lymphadenopathy, no easy bruising. Psychiatric: No confusion, no hallucinations, no sleep disturbance.    Physical Exam: Filed Vitals:   06/22/12 1427  BP: 124/66  Pulse: 92   the general appearance reveals an elderly woman in no distress.  She has an aide with her.  She has resolving ecchymosis of the left side of her face.The head and neck exam reveals pupils equal and reactive.  Extraocular movements are full.  There is no scleral icterus.  The mouth and pharynx are normal.  The neck is supple.  The carotids reveal no bruits.  The jugular venous pressure is normal.  The  thyroid is not enlarged.  There is no lymphadenopathy.  The chest is clear to percussion and auscultation.  There are no rales or rhonchi.  Expansion of the chest is symmetrical.  The precordium is quiet.  The pulse is irregularly irregular The first heart sound is normal.  The second heart sound is physiologically split.  There is no murmur gallop rub or click.  There is no abnormal lift or heave.  The abdomen is soft and nontender.  The bowel sounds are normal.  The liver and spleen are not enlarged.  There are no abdominal masses.  There are no abdominal bruits.  Extremities reveal good pedal pulses.  There is no phlebitis or edema.  There is no cyanosis or clubbing.  Strength is normal and symmetrical in all extremities.  There is no lateralizing weakness.  There are no sensory deficits.  The skin is warm and dry.  There is no rash.  EKG shows atrial fibrillation with controlled ventricular response.  Assessment / Plan: We discussed the pros and cons of anticoagulation with warfarin versus no anticoagulation.  She has a chadssvasc  score of 5.  I recommend continuing warfarin but if she continues to have falls we will  need to rethink this strategy. Recheck in 4 months for followup office visit and fasting lab work and a CBC

## 2012-06-22 NOTE — Assessment & Plan Note (Signed)
She has seen vascular surgery regarding her ankle ulcer.  No surgery is planned.  She is putting moisturizing cream on the lesion and this seems to be helping.

## 2012-06-22 NOTE — Assessment & Plan Note (Signed)
EKG today shows atrial fibrillation with a controlled ventricular response.  The patient has not been experiencing any TIA or stroke symptoms.  Protime today is pending

## 2012-06-22 NOTE — Telephone Encounter (Signed)
New Problem  Pt has a question about her visit today. She said Dr Patty Sermons told her what her risk factors are for a stroke.

## 2012-06-22 NOTE — Telephone Encounter (Signed)
Discussed with patient and her stopping Warfarin having her at increased risk of stroke

## 2012-06-22 NOTE — Patient Instructions (Addendum)
Your physician recommends that you continue on your current medications as directed. Please refer to the Current Medication list given to you today.  Your physician wants you to follow-up in: 4 months with fasting labs (lp/bmet/hfp/cbc)  You will receive a reminder letter in the mail two months in advance. If you don't receive a letter, please call our office to schedule the follow-up appointment.  

## 2012-06-22 NOTE — Assessment & Plan Note (Signed)
The patient has not been experiencing any chest pain or angina pectoris. 

## 2012-06-29 ENCOUNTER — Other Ambulatory Visit: Payer: Self-pay | Admitting: Cardiology

## 2012-06-30 ENCOUNTER — Telehealth: Payer: Self-pay | Admitting: Cardiology

## 2012-06-30 NOTE — Progress Notes (Signed)
ST Evaluation Addendum   12/29/11 1448  SLP G-Codes **NOT FOR INPATIENT CLASS**  Functional Assessment Tool Used MBS; skilled observation  Functional Limitations Swallowing  Swallow Current Status (Z6109) CI  Swallow Goal Status (U0454) CI  Swallow Discharge Status (U9811) CI  SLP Evaluations  $ SLP Speech Visit 1 Procedure  SLP Evaluations  $Swallowing Treatment 1 Procedure  $MBS Swallow Outpatient 1 Procedure  THIS NOTE IS A LATE ENTRY

## 2012-06-30 NOTE — Addendum Note (Signed)
Encounter addended by: Royce Macadamia, CCC-SLP on: 06/30/2012  3:40 PM<BR>     Documentation filed: Clinical Notes, Inpatient Document Flowsheet

## 2012-06-30 NOTE — Telephone Encounter (Signed)
New Problem  Pt wants to know what she can safely take to help her sleep at night.

## 2012-06-30 NOTE — Telephone Encounter (Signed)
Patient states the last couple of nights she has had difficulty sustaining sleep greater than two hours at a time due to restlessness. States she had been ordered Ultram but does not like how it makes her feel "funny" and nervous. Denies any other specific symptoms or complaints at this time. Requesting suggestion of what other medication she can take as a sleep aid.  Advised patient to call her PCP for recommendation. Offered her to call back today should she not be able to get in touch with her PCP for this problem. Patient verbalized understanding that she should go through her primary care MD first to see if they have recommendations.

## 2012-07-19 ENCOUNTER — Encounter: Payer: Self-pay | Admitting: Family Medicine

## 2012-07-19 ENCOUNTER — Ambulatory Visit (INDEPENDENT_AMBULATORY_CARE_PROVIDER_SITE_OTHER): Payer: Medicare Other | Admitting: Family Medicine

## 2012-07-19 VITALS — BP 138/74 | HR 69 | Temp 97.7°F | Resp 16 | Ht 62.0 in | Wt 115.2 lb

## 2012-07-19 DIAGNOSIS — T07XXXA Unspecified multiple injuries, initial encounter: Secondary | ICD-10-CM | POA: Diagnosis not present

## 2012-07-19 DIAGNOSIS — M25539 Pain in unspecified wrist: Secondary | ICD-10-CM | POA: Diagnosis not present

## 2012-07-19 DIAGNOSIS — F411 Generalized anxiety disorder: Secondary | ICD-10-CM

## 2012-07-19 DIAGNOSIS — F419 Anxiety disorder, unspecified: Secondary | ICD-10-CM

## 2012-07-19 DIAGNOSIS — F489 Nonpsychotic mental disorder, unspecified: Secondary | ICD-10-CM

## 2012-07-19 DIAGNOSIS — M25532 Pain in left wrist: Secondary | ICD-10-CM

## 2012-07-19 DIAGNOSIS — R202 Paresthesia of skin: Secondary | ICD-10-CM

## 2012-07-19 DIAGNOSIS — R209 Unspecified disturbances of skin sensation: Secondary | ICD-10-CM

## 2012-07-19 DIAGNOSIS — F5105 Insomnia due to other mental disorder: Secondary | ICD-10-CM

## 2012-07-19 MED ORDER — ALPRAZOLAM 0.25 MG PO TABS: ORAL_TABLET | ORAL | Status: DC

## 2012-07-19 NOTE — Patient Instructions (Addendum)
Take Tramadol with a meal or snack to avoid the stomach upset and dizziness that can occur with this medication.  You can take Alprazolam 1/2 tablet in the evening as it starts to get dark so that you feel less anxious and can relax better.  You can stop taking the magnesium and zinc once you have finished these tablets. There are traces of magnesium and zinc in your other vitamin supplements.

## 2012-07-20 ENCOUNTER — Ambulatory Visit (INDEPENDENT_AMBULATORY_CARE_PROVIDER_SITE_OTHER): Payer: Medicare Other | Admitting: *Deleted

## 2012-07-20 ENCOUNTER — Other Ambulatory Visit (INDEPENDENT_AMBULATORY_CARE_PROVIDER_SITE_OTHER): Payer: Medicare Other

## 2012-07-20 DIAGNOSIS — I4891 Unspecified atrial fibrillation: Secondary | ICD-10-CM

## 2012-07-20 DIAGNOSIS — R238 Other skin changes: Secondary | ICD-10-CM

## 2012-07-20 DIAGNOSIS — I4892 Unspecified atrial flutter: Secondary | ICD-10-CM

## 2012-07-20 DIAGNOSIS — E7800 Pure hypercholesterolemia, unspecified: Secondary | ICD-10-CM

## 2012-07-20 DIAGNOSIS — E785 Hyperlipidemia, unspecified: Secondary | ICD-10-CM | POA: Diagnosis not present

## 2012-07-20 LAB — HEPATIC FUNCTION PANEL
ALT: 17 U/L (ref 0–35)
AST: 27 U/L (ref 0–37)
Albumin: 3.7 g/dL (ref 3.5–5.2)
Alkaline Phosphatase: 95 U/L (ref 39–117)
Bilirubin, Direct: 0 mg/dL (ref 0.0–0.3)
Total Bilirubin: 0.8 mg/dL (ref 0.3–1.2)
Total Protein: 7.1 g/dL (ref 6.0–8.3)

## 2012-07-20 LAB — CBC WITH DIFFERENTIAL/PLATELET
Basophils Absolute: 0 10*3/uL (ref 0.0–0.1)
Basophils Relative: 0.5 % (ref 0.0–3.0)
Eosinophils Absolute: 0.1 10*3/uL (ref 0.0–0.7)
Eosinophils Relative: 1.1 % (ref 0.0–5.0)
HCT: 37.8 % (ref 36.0–46.0)
Hemoglobin: 12.5 g/dL (ref 12.0–15.0)
Lymphocytes Relative: 18.2 % (ref 12.0–46.0)
Lymphs Abs: 1.5 10*3/uL (ref 0.7–4.0)
MCHC: 33 g/dL (ref 30.0–36.0)
MCV: 97.2 fl (ref 78.0–100.0)
Monocytes Absolute: 1 10*3/uL (ref 0.1–1.0)
Monocytes Relative: 13.1 % — ABNORMAL HIGH (ref 3.0–12.0)
Neutro Abs: 5.3 10*3/uL (ref 1.4–7.7)
Neutrophils Relative %: 67.1 % (ref 43.0–77.0)
Platelets: 249 10*3/uL (ref 150.0–400.0)
RBC: 3.89 Mil/uL (ref 3.87–5.11)
RDW: 15.2 % — ABNORMAL HIGH (ref 11.5–14.6)
WBC: 8 10*3/uL (ref 4.5–10.5)

## 2012-07-20 LAB — BASIC METABOLIC PANEL
BUN: 15 mg/dL (ref 6–23)
CO2: 34 mEq/L — ABNORMAL HIGH (ref 19–32)
Calcium: 9 mg/dL (ref 8.4–10.5)
Chloride: 102 mEq/L (ref 96–112)
Creatinine, Ser: 0.9 mg/dL (ref 0.4–1.2)
GFR: 64.09 mL/min (ref >60.00)
Glucose, Bld: 87 mg/dL (ref 70–99)
Potassium: 4 mEq/L (ref 3.5–5.1)
Sodium: 134 mEq/L — ABNORMAL LOW (ref 135–145)

## 2012-07-20 LAB — POCT INR: INR: 2.5

## 2012-07-20 LAB — LIPID PANEL
Cholesterol: 174 mg/dL (ref 0–200)
HDL: 71.1 mg/dL (ref >39.00)
LDL Cholesterol: 73 mg/dL (ref 0–99)
Total CHOL/HDL Ratio: 2
Triglycerides: 148 mg/dL (ref 0.0–149.0)
VLDL: 29.6 mg/dL (ref 0.0–40.0)

## 2012-07-20 NOTE — Progress Notes (Signed)
S:  This 79 y.o. Cauc female returns for follow-up after a fall and eval at [facility] ED in late May 2014 and later at [facility]. When questioned about the fall, the pt denies any pre-syncopal symptoms; her sandal caught in the entry-way and she fell forward, sustaining a hematoma over L eyebrow and L wrist sprain. She continue to have L hand and wrist pain w/ paresthesia of thumb. Pt reports she has scheduled an eval w/ Dr. Teressa Senter. Sleep is interrupted by pain, not relieved by Tramadol. Pt stopped taking Tramadol because of "weird feeling" experienced one night after taking it ~ 1-2 AM. She agrees that taking this med on an empty stomach cause her to have medication side effect.  Pt  Is concerned about the resolving hematoma over L eye and the residual swelling that is still evident. Her L eyelid seems to be drooping since the fall. Vision is at baseline as is hearing.  Pt having mild anxiety which seems to increase in the evening hours darkness approaches.She lives alone and many of her friends are moving to assisted living or retirement homes. She is not interested in making this kind of transition. She values her independence but requests medication to help her relax at night so that she can sleep better.    PMHX, Soc Hx and Problem List reviewed.  O: Filed Vitals:   07/19/12 1145  BP: 138/74  Pulse: 69  Temp: 97.7 F (36.5 C)  Resp: 16   GEN: in NAD; slender body habitus (not frail). HENT: [place]/AT. EOMI w/ clear conj/sclerae. EACs/nose/ oroph normal. L eyebrow slightly swollen and minimally tender. COR: RRR. LUNGS: Normal resp rate and effort. SKIN: W&D;L eyebrow slightly discolored w/ fading bruise. Lower ext varicosities; hyperpigmentation and healed scars. MS: MAEs. No major deformities. L wrist- good ROM w/ minimal tenderness; deg changes in digits. Neurovasc intact. NEURO: A&O x 3; CNs intact. Ambulates w/ a cane. PSYCH: Pleasant demeanor, calm. Briefly tearful and anxious w/ discussion of  family death and living alone. Speech pattern and thought content normal (some difficulty w/ word finding and concentration). Judgement sound.  A/P: Wrist pain, left- Sprain resolving but advised to keep appt w/ Dr. Teressa Senter.  Insomnia secondary to anxiety- RX: Alprazolam 0.25 mg  1/2 tablet up to twice a day prn anxniety.  Multiple contusions- Pt advised to take Tramadol at bedtime w/ a snack. Do not take Alprazolam at same time.  Paresthesia of hand- See ORTHO as scheduled.

## 2012-07-21 NOTE — Progress Notes (Signed)
Quick Note:  Please report to patient. The recent labs are stable. Continue same medication and careful diet. ______ 

## 2012-07-24 DIAGNOSIS — M19039 Primary osteoarthritis, unspecified wrist: Secondary | ICD-10-CM | POA: Diagnosis not present

## 2012-07-25 ENCOUNTER — Telehealth: Payer: Self-pay | Admitting: Cardiology

## 2012-07-25 NOTE — Telephone Encounter (Signed)
Follow Up  Pt is calling regarding her lab results.

## 2012-07-25 NOTE — Telephone Encounter (Signed)
Advised patient of lab results  

## 2012-07-25 NOTE — Telephone Encounter (Signed)
Message copied by Burnell Blanks on Tue Jul 25, 2012 11:53 AM ------      Message from: Cassell Clement      Created: Fri Jul 21, 2012 12:26 PM       Please report to patient.  The recent labs are stable. Continue same medication and careful diet. ------

## 2012-08-03 ENCOUNTER — Telehealth: Payer: Self-pay

## 2012-08-03 NOTE — Telephone Encounter (Signed)
Called patient

## 2012-08-03 NOTE — Telephone Encounter (Signed)
Dr Audria Nine prescribed xanax for pt. Now she wants to wean off it and wants to talk to someone about this. She said the directions said not to stop taking all at once. I think she has just taken it for a few days, but wanted to go by the directions.

## 2012-08-03 NOTE — Telephone Encounter (Signed)
Called her advised she is okay to d/c this medication. No need to wean or decrease.

## 2012-08-04 DIAGNOSIS — H40059 Ocular hypertension, unspecified eye: Secondary | ICD-10-CM | POA: Diagnosis not present

## 2012-08-04 DIAGNOSIS — H5315 Visual distortions of shape and size: Secondary | ICD-10-CM | POA: Diagnosis not present

## 2012-08-04 DIAGNOSIS — D3130 Benign neoplasm of unspecified choroid: Secondary | ICD-10-CM | POA: Diagnosis not present

## 2012-08-04 DIAGNOSIS — Z961 Presence of intraocular lens: Secondary | ICD-10-CM | POA: Diagnosis not present

## 2012-08-11 ENCOUNTER — Telehealth: Payer: Self-pay

## 2012-08-11 NOTE — Telephone Encounter (Signed)
Have you gotten a note from Dr Dione Booze? Pended neurology appt.

## 2012-08-11 NOTE — Telephone Encounter (Signed)
Pt has been seen by dr Dione Booze and dr Dione Booze wants pt to see dr Audria Nine for what he suspects is a neurological issue. Pt wants a referral from dr Audria Nine and wants to talk with her directly

## 2012-08-15 ENCOUNTER — Encounter: Payer: Self-pay | Admitting: Vascular Surgery

## 2012-08-15 NOTE — Telephone Encounter (Signed)
I have no note from Dr. Dione Booze; pt was seen in the past at [facility]; I called and left a message that I would try calling back later to discuss this with her.

## 2012-08-15 NOTE — Telephone Encounter (Signed)
Spoke w/ pt about infrequent vision disturbance; recent exam by Dr. Dione Booze unremarkable. He suggested considering MRI or Neurology evaluation. Pt thinks that she should not undergo expensive testing at this time, given that episodic vision disturbance is not accompanied by other symptoms and they are occuring so infrequently. She agrees to monitor this symptoms. We reviewed recent CT of head at end of May 2014 (imaging done after pt had a fall with contusion to head). 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 Pt will schedule office visit in early Oct 2014 and come in sooner if needed.

## 2012-08-16 ENCOUNTER — Encounter: Payer: Self-pay | Admitting: Vascular Surgery

## 2012-08-16 ENCOUNTER — Other Ambulatory Visit (INDEPENDENT_AMBULATORY_CARE_PROVIDER_SITE_OTHER): Payer: Medicare Other | Admitting: *Deleted

## 2012-08-16 ENCOUNTER — Ambulatory Visit (INDEPENDENT_AMBULATORY_CARE_PROVIDER_SITE_OTHER): Payer: Medicare Other | Admitting: Vascular Surgery

## 2012-08-16 DIAGNOSIS — I8310 Varicose veins of unspecified lower extremity with inflammation: Secondary | ICD-10-CM | POA: Diagnosis not present

## 2012-08-16 DIAGNOSIS — I6529 Occlusion and stenosis of unspecified carotid artery: Secondary | ICD-10-CM

## 2012-08-16 DIAGNOSIS — I6522 Occlusion and stenosis of left carotid artery: Secondary | ICD-10-CM | POA: Insufficient documentation

## 2012-08-16 NOTE — Progress Notes (Signed)
Vascular and Vein Specialist of [facility]  Patient name: Vanessa [name] MRN: [id number] DOB: [birth year] Sex: female  REASON FOR VISIT: follow up of carotid disease.  HPI: Vanessa [name] is a 79 y.o. female who has a known left internal carotid artery occlusion. We follow her right carotid closely for this reason. She comes in for a yearly follow up visit. Since I saw her last she denies any history of stroke, TIAs, expressive or receptive aphasia, or amaurosis fugax. Her only complaint is some pain in her left leg especially related to her varicose veins. These seem to be more symptomatic over the last 6 months.  Past Medical History  Diagnosis Date  . Atrial fibrillation   . Neuropathy     PERIPHERAL  . Carotid artery occlusion     LEFT  . Scoliosis   . Hoarseness   . Headache(784.0)   . Dizziness   . Abdominal bloating   . Bruises easily   . Pruritus   . Hypercholesterolemia   . Fall at home Sept 2013, Dec. 2013  Jun 08, 2012   Family History  Problem Relation Age of Onset  . Heart disease    . Diabetes Mother   . Heart attack Mother   . Heart disease Mother   . Heart attack Father   . Stroke Father   . Cancer Sister   . Heart disease Maternal Grandmother    SOCIAL HISTORY: History  Substance Use Topics  . Smoking status: Never Smoker   . Smokeless tobacco: Never Used  . Alcohol Use: No   Allergies  Allergen Reactions  . Tape Other (See Comments)    Tears skin.   Current Outpatient Prescriptions  Medication Sig Dispense Refill  . ALPRAZolam (XANAX) 0.25 MG tablet Take 1/2 tablet at bedtime as needed for sleep.  30 tablet  1  . atenolol (TENORMIN) 25 MG tablet Take 1 tablet (25 mg total) by mouth 2 (two) times daily.  60 tablet  11  . BIOTIN 5000 PO Take by mouth daily.      . Calcium-Vitamin D-Vitamin K (VIACTIV PO) Take by mouth.      . cetaphil (CETAPHIL) cream Apply topically as needed.      . Multiple Vitamin (MULTIVITAMIN) tablet Take 1 tablet by  mouth daily.        . simvastatin (ZOCOR) 40 MG tablet Take 0.5 tablets (20 mg total) by mouth daily.  30 tablet  5  . traMADol (ULTRAM) 50 MG tablet Take 1 tablet (50 mg total) by mouth every 6 (six) hours as needed for pain.  15 tablet  0  . warfarin (COUMADIN) 2 MG tablet TAKE 1 TABLET BY MOUTH AS DIRECTED.  105 tablet  1  . zinc gluconate 50 MG tablet Take 50 mg by mouth daily.      . magnesium 30 MG tablet Take 30 mg by mouth 2 (two) times daily.       No current facility-administered medications for this visit.   REVIEW OF SYSTEMS: Arly.Keller ] denotes positive finding; [  ] denotes negative finding  CARDIOVASCULAR:  [ ]  chest pain   [ ]  chest pressure   Arly.Keller ] palpitations   [ ]  orthopnea   [ ]  dyspnea on exertion   [ ]  claudication   [ ]  rest pain   [ ]  DVT   [ ]  phlebitis PULMONARY:   [ ]  productive cough   [ ]  asthma   [ ]  wheezing NEUROLOGIC:   [ ]   weakness  [ ]  paresthesias  [ ]  aphasia  [ ]  amaurosis  [ ]  dizziness HEMATOLOGIC:   [ ]  bleeding problems   [ ]  clotting disorders MUSCULOSKELETAL:  [ ]  joint pain   [ ]  joint swelling Arly.Keller ] leg swelling GASTROINTESTINAL: [ ]   blood in stool  [ ]   hematemesis GENITOURINARY:  [ ]   dysuria  [ ]   hematuria PSYCHIATRIC:  [ ]  history of major depression INTEGUMENTARY:  [ ]  rashes  [ ]  ulcers CONSTITUTIONAL:  [ ]  fever   [ ]  chills  PHYSICAL EXAM: Filed Vitals:   08/16/12 1230 08/16/12 1233  BP: 140/80 130/62  Pulse: 73 76  Resp: 18   Height: 5\' 2"  (1.575 m)   Weight: 115 lb (52.164 kg)   SpO2: 94%    Body mass index is 21.03 kg/(m^2). GENERAL: The patient is a well-nourished female, in no acute distress. The vital signs are documented above. CARDIOVASCULAR: There is a regular rate and rhythm. I do not detect carotid bruits. PULMONARY: There is good air exchange bilaterally without wheezing or rales. ABDOMEN: Soft and non-tender with normal pitched bowel sounds.  MUSCULOSKELETAL: There are no major deformities or cyanosis. NEUROLOGIC: No  focal weakness or paresthesias are detected. SKIN: She has large varicose veins of both lower extremities. He has hyperpigmentation bilaterally consistent with chronic venous insufficiency. PSYCHIATRIC: The patient has a normal affect.  DATA:  I have independently interpret her carotid duplex scan which shows a less than 40% right carotid stenosis with a known left internal carotid artery occlusion. Both vertebral arteries are patent with normally directed flow.  MEDICAL ISSUES: She has a less than 40% right carotid stenosis. Given the left internal carotid artery occlusion, I think we should continue with yearly carotid duplex scans. I've ordered a carotid duplex can in one year and I'll see her back at that time. He knows to call sooner she has problems. She is on Coumadin and for this reason is not taking aspirin. She is on Zocor.  With respect to her varicose veins, we've discussed the importance of intermittent leg elevation. She has a problem getting on her compression stockings have not written her a prescription for compression stockings. I had previously seen her in December 2013 at that time she did have some incompetence of the proximal left greater saphenous vein and also some incompetence in the deep system on the left. That time I did not recommend aggressive approach to her incompetent greater saphenous vein on the left. I would still favor a conservative approach. I will see her back in one year. She knows to call sooner if she has problems.  Rubby Barbary S [facility] [place] [facility]: [phone]

## 2012-08-17 NOTE — Addendum Note (Signed)
Addended by: Sharee Pimple on: 08/17/2012 08:18 AM   Modules accepted: Orders

## 2012-08-22 DIAGNOSIS — N3941 Urge incontinence: Secondary | ICD-10-CM | POA: Diagnosis not present

## 2012-08-23 ENCOUNTER — Ambulatory Visit (INDEPENDENT_AMBULATORY_CARE_PROVIDER_SITE_OTHER): Payer: Medicare Other | Admitting: *Deleted

## 2012-08-23 DIAGNOSIS — I4891 Unspecified atrial fibrillation: Secondary | ICD-10-CM

## 2012-08-23 LAB — POCT INR: INR: 2.5

## 2012-08-25 DIAGNOSIS — M19039 Primary osteoarthritis, unspecified wrist: Secondary | ICD-10-CM | POA: Diagnosis not present

## 2012-08-28 DIAGNOSIS — Z85828 Personal history of other malignant neoplasm of skin: Secondary | ICD-10-CM | POA: Diagnosis not present

## 2012-08-28 DIAGNOSIS — L299 Pruritus, unspecified: Secondary | ICD-10-CM | POA: Diagnosis not present

## 2012-08-28 DIAGNOSIS — D239 Other benign neoplasm of skin, unspecified: Secondary | ICD-10-CM | POA: Diagnosis not present

## 2012-08-28 DIAGNOSIS — L821 Other seborrheic keratosis: Secondary | ICD-10-CM | POA: Diagnosis not present

## 2012-08-28 DIAGNOSIS — L723 Sebaceous cyst: Secondary | ICD-10-CM | POA: Diagnosis not present

## 2012-09-12 ENCOUNTER — Telehealth: Payer: Self-pay

## 2012-09-12 NOTE — Telephone Encounter (Signed)
Patient walked into [address] requesting that Dr. Audria Nine please complete her nerve study form. Patient says she dropped it off on Thursday at [address] Dr. Audria Nine is aware. Patient was checking on status of it so it can be complete.

## 2012-09-13 ENCOUNTER — Telehealth: Payer: Self-pay | Admitting: Cardiology

## 2012-09-13 NOTE — Telephone Encounter (Signed)
Patient had a recent fall and is still having problems with her wrist. Saw Dr Teressa Senter and he has recommended a nerve conduction study. Patient researched nerve conduction study and it said to make sure you check with your doctor if you take Warfarin. Patient wants to make sure ok to proceed since  Dr. Patty Sermons Rx'd Warfarin. Will forward to  Dr. Patty Sermons for review

## 2012-09-13 NOTE — Telephone Encounter (Signed)
Okay to proceed with nerve conduction study. Leave off warfarin for 2 days before the study to be sure no excessive bleeding.

## 2012-09-13 NOTE — Telephone Encounter (Signed)
New Problem  Pt states she has a question that she does not feel comfortable disclosing w/ Sch rep.

## 2012-09-13 NOTE — Telephone Encounter (Signed)
I called pt to discuss her request for NCV study. It is not clear from the print out (from [employer]) why the pt thinks she needs this test. I wll try contacting her again on Thursday (09/14/12).

## 2012-09-14 NOTE — Telephone Encounter (Signed)
Left message to call back  

## 2012-09-14 NOTE — Telephone Encounter (Signed)
Follow Up  ° °Returned call  °

## 2012-09-14 NOTE — Telephone Encounter (Signed)
Advised patient

## 2012-09-14 NOTE — Telephone Encounter (Signed)
I spoke with Vanessa [name] about NCS that ORTHO Engineer, building services) wanted to perform at her last visit. Pt felt uncomfortable about this nad test was scheduled for later date; in the meantime, pt read some info on [employer] that she wanted to discuss. She spoke w/ Dr. Patty Sermons (Cardiology) about having to stop Warfarin prior to having this test. Other concerns have evolved and pt eventually cancelled test. She has persistent numbness in her thumb since falling in May 2014. She feels like she can live with the "strange sensation" in her thumb and does not want to have any unnecessary testing or potential surgery. She will schedule a follow-up appt with ORTHO and inquire about long term prognosis of this problem. Certainly, she is advised to seek evaluation if weak grip or muscle atrophy develops or numbness or pain worsens. She understands and prefers "watchful waiting".

## 2012-09-29 ENCOUNTER — Ambulatory Visit (INDEPENDENT_AMBULATORY_CARE_PROVIDER_SITE_OTHER): Payer: Medicare Other | Admitting: Emergency Medicine

## 2012-09-29 VITALS — BP 118/84 | HR 82 | Temp 98.0°F | Resp 16 | Ht 61.0 in | Wt 114.0 lb

## 2012-09-29 DIAGNOSIS — L299 Pruritus, unspecified: Secondary | ICD-10-CM

## 2012-09-29 DIAGNOSIS — R21 Rash and other nonspecific skin eruption: Secondary | ICD-10-CM

## 2012-09-29 DIAGNOSIS — Z23 Encounter for immunization: Secondary | ICD-10-CM

## 2012-09-29 NOTE — Patient Instructions (Signed)
Please tricalm cream to apply in the morning and at bedtime. Please gets Zyrtec 10 mg and take one at night. Only use aveeno or dove soap

## 2012-09-29 NOTE — Progress Notes (Deleted)
  Subjective:    Patient ID: Vanessa [name], female    DOB: 04/10/1920, 79 y.o.   MRN: [id number]  HPI    Review of Systems     Objective:   Physical Exam        Assessment & Plan:

## 2012-09-29 NOTE — Progress Notes (Signed)
  Subjective:    Patient ID: Vanessa [name], female    DOB: [birth year], 79 y.o.   MRN: [id number]  Back Pain      Review of Systems  Musculoskeletal: Positive for back pain.   pt presents with chronic itchy back. Sx started four years ago . Cetaphil relieves the itching for 3-4 hours. Seen her dermatologist 2-3 weeks ago for this at [facility]. Was prescribed creams, but no relief. OTC does not help. Claritin didn't help.      Objective:   Physical Exam patient has some mild redness of her back. She has a seborrheic keratosis on right side of her 4 had an isolated seborrheic keratoses on the rest of her body.        Assessment & Plan:  She will try tricalm cream to apply twice a day. She will take Zyrtec 10 mg one at bedtime. Would consider a low dose Periactin at night if she does not respond to the above the

## 2012-10-04 ENCOUNTER — Ambulatory Visit (INDEPENDENT_AMBULATORY_CARE_PROVIDER_SITE_OTHER): Payer: Medicare Other | Admitting: *Deleted

## 2012-10-04 DIAGNOSIS — I4891 Unspecified atrial fibrillation: Secondary | ICD-10-CM | POA: Diagnosis not present

## 2012-10-04 LAB — POCT INR: INR: 2.3

## 2012-10-06 ENCOUNTER — Telehealth: Payer: Self-pay | Admitting: Radiology

## 2012-10-06 NOTE — Telephone Encounter (Signed)
She needs to just stay on them if she is doing well.

## 2012-10-06 NOTE — Telephone Encounter (Signed)
Patient state she is better with her back the tricalm and the zyrtec have been helpful. She wants to know how long she is to use these. Please advise. Phone [phone] or [phone]

## 2012-10-08 NOTE — Telephone Encounter (Signed)
Patient advised to continue for another couple weeks.

## 2012-10-11 ENCOUNTER — Ambulatory Visit (INDEPENDENT_AMBULATORY_CARE_PROVIDER_SITE_OTHER): Payer: Medicare Other | Admitting: Cardiology

## 2012-10-11 ENCOUNTER — Encounter: Payer: Self-pay | Admitting: Cardiology

## 2012-10-11 ENCOUNTER — Ambulatory Visit: Payer: Medicare Other | Admitting: Cardiology

## 2012-10-11 VITALS — BP 122/62 | HR 72 | Ht 62.25 in | Wt 115.0 lb

## 2012-10-11 DIAGNOSIS — E7800 Pure hypercholesterolemia, unspecified: Secondary | ICD-10-CM | POA: Diagnosis not present

## 2012-10-11 DIAGNOSIS — M19039 Primary osteoarthritis, unspecified wrist: Secondary | ICD-10-CM | POA: Diagnosis not present

## 2012-10-11 DIAGNOSIS — I4891 Unspecified atrial fibrillation: Secondary | ICD-10-CM | POA: Diagnosis not present

## 2012-10-11 DIAGNOSIS — I6529 Occlusion and stenosis of unspecified carotid artery: Secondary | ICD-10-CM | POA: Diagnosis not present

## 2012-10-11 DIAGNOSIS — I6522 Occlusion and stenosis of left carotid artery: Secondary | ICD-10-CM

## 2012-10-11 DIAGNOSIS — L97909 Non-pressure chronic ulcer of unspecified part of unspecified lower leg with unspecified severity: Secondary | ICD-10-CM | POA: Diagnosis not present

## 2012-10-11 DIAGNOSIS — G5600 Carpal tunnel syndrome, unspecified upper limb: Secondary | ICD-10-CM | POA: Diagnosis not present

## 2012-10-11 NOTE — Assessment & Plan Note (Signed)
Patient has history of hypercholesterolemia.  Her LDL cholesterol is at goal on low-dose simvastatin.  She is not having any clear cut myalgias.

## 2012-10-11 NOTE — Patient Instructions (Signed)
Your physician recommends that you continue on your current medications as directed. Please refer to the Current Medication list given to you today.  Your physician wants you to follow-up in: 4 months with fasting labs (lp/bmet/hfp) You will receive a reminder letter in the mail two months in advance. If you don't receive a letter, please call our office to schedule the follow-up appointment.  

## 2012-10-11 NOTE — Assessment & Plan Note (Signed)
The patient has not been experiencing any TIA symptoms.  She remains on long-term Coumadin

## 2012-10-11 NOTE — Assessment & Plan Note (Signed)
The patient remains in atrial fibrillation with a controlled ventricular response.  We did not do an EKG today.  She is not having any TIA symptoms.  She is not having any symptoms of CHF.

## 2012-10-11 NOTE — Progress Notes (Signed)
Vanessa [name] Date of Birth:  May 30, 1920 [facility] [address] [place], [place]  [address] [phone]         Fax   [phone]  History of Present Illness: This pleasant 79 year old widowed woman is seen for a scheduled four-month followup office visit. She has a history of atypical chest pain. She was seen in March 2013 with these symptoms and underwent a nuclear stress test subsequently which showed no ischemia. The patient reports that her symptoms have improved. She recently saw Dr. Matthias Hughs who placed her on Nexium for dysphagia. She reports improvement in those symptoms since starting on Nexium. She has had a history of frequent falls.  She has not fallen since we last saw her 4 months ago. She is on long-term Coumadin because of her chronic atrial flutter fibrillation. She has a history of vascular disease with known chronic occlusion of her left carotid artery.  She has a past history of high blood pressure.    Her CHADSSVASC  score is 5 (for age 11, female sex, high blood pressure, and vascular disease with occluded left internal carotid artery).  Since we last saw her she has been having a lot of dermatologic problems and has an appointment to see her new dermatologist Dr. Margo Aye soon.   Current Outpatient Prescriptions  Medication Sig Dispense Refill  . ALPRAZolam (XANAX) 0.25 MG tablet Take 1/2 tablet at bedtime as needed for sleep.  30 tablet  1  . atenolol (TENORMIN) 25 MG tablet Take 1 tablet (25 mg total) by mouth 2 (two) times daily.  60 tablet  11  . BIOTIN 5000 PO Take by mouth daily.      . Calcium-Vitamin D-Vitamin K (VIACTIV PO) Take by mouth.      . cetaphil (CETAPHIL) cream Apply topically as needed.      . Multiple Vitamin (MULTIVITAMIN) tablet Take 1 tablet by mouth daily.        . simvastatin (ZOCOR) 40 MG tablet Take 0.5 tablets (20 mg total) by mouth daily.  30 tablet  5  . traMADol (ULTRAM) 50 MG tablet Take 1 tablet (50 mg total) by  mouth every 6 (six) hours as needed for pain.  15 tablet  0  . warfarin (COUMADIN) 2 MG tablet Take as directed by coumadin clinic       No current facility-administered medications for this visit.    Allergies  Allergen Reactions  . Tape Other (See Comments)    Tears skin.    Patient Active Problem List   Diagnosis Date Noted  . Raynaud phenomenon 06/26/2010    Priority: High  . Osteoarthritis 06/26/2010    Priority: High  . Neuropathy     Priority: High  . Occlusion and stenosis of carotid artery without mention of cerebral infarction 08/16/2012  . Hematoma 02/08/2012  . Varicose veins of lower extremities with other complications 12/22/2011  . Malaise and fatigue 10/06/2011  . Pain, ankle 09/09/2011  . Ankle ulcer 09/09/2011  . Leg ulcer 08/30/2011  . Chest pain 04/02/2011  . Atrial fibrillation   . Carotid artery occlusion   . Hypercholesterolemia   . Scoliosis   . Hoarseness   . Headache   . Dizziness   . Abdominal bloating   . Bruises easily   . Pruritus     History  Smoking status  . Never Smoker   Smokeless tobacco  . Never Used    History  Alcohol Use No    Family  History  Problem Relation Age of Onset  . Heart disease    . Diabetes Mother   . Heart attack Mother   . Heart disease Mother   . Heart attack Father   . Stroke Father   . Cancer Sister   . Heart disease Maternal Grandmother     Review of Systems: Constitutional: no fever chills diaphoresis or fatigue or change in weight.  Head and neck: no hearing loss, no epistaxis, no photophobia or visual disturbance. Respiratory: No cough, shortness of breath or wheezing. Cardiovascular: No chest pain peripheral edema, palpitations. Gastrointestinal: No abdominal distention, no abdominal pain, no change in bowel habits hematochezia or melena. Genitourinary: No dysuria, no frequency, no urgency, no nocturia. Musculoskeletal:No arthralgias, no back pain, no gait disturbance or  myalgias. Neurological: No dizziness, no headaches, no numbness, no seizures, no syncope, no weakness, no tremors. Hematologic: No lymphadenopathy, no easy bruising. Psychiatric: No confusion, no hallucinations, no sleep disturbance.    Physical Exam: Filed Vitals:   10/11/12 1112  BP: 122/62  Pulse: 72   the general appearance reveals an elderly woman in no distress.  She has an aide with her.  She has resolving ecchymosis of the left side of her face.The head and neck exam reveals pupils equal and reactive.  Extraocular movements are full.  There is no scleral icterus.  The mouth and pharynx are normal.  The neck is supple.  The carotids reveal no bruits.  The jugular venous pressure is normal.  The  thyroid is not enlarged.  There is no lymphadenopathy.  The chest is clear to percussion and auscultation.  There are no rales or rhonchi.  Expansion of the chest is symmetrical.  The precordium is quiet.  The pulse is irregularly irregular The first heart sound is normal.  The second heart sound is physiologically split.  There is no murmur gallop rub or click.  There is no abnormal lift or heave.  The abdomen is soft and nontender.  The bowel sounds are normal.  The liver and spleen are not enlarged.  There are no abdominal masses.  There are no abdominal bruits.  Extremities reveal good pedal pulses.  There is no phlebitis or edema.  There is no cyanosis or clubbing.  Strength is normal and symmetrical in all extremities.  There is no lateralizing weakness.  There are no sensory deficits.  The skin is warm and dry.  There is no rash.  She does have several actinic areas on the face that she will be discussing with Dr. Margo Aye, her dermatologist    Assessment / Plan: We discussed the pros and cons of anticoagulation with warfarin versus no anticoagulation.  She has a chadssvasc  score of 5.  I recommend continuing warfarin.  Fortunately she has not had any further falls Continue same medication.   Return in 4 months for followup office visit fasting lipid panel hepatic function panel i and basal metabolic panel and CBC and EKG.

## 2012-10-11 NOTE — Assessment & Plan Note (Signed)
Since last visit her leg ulcer has healed.

## 2012-10-13 ENCOUNTER — Encounter: Payer: Self-pay | Admitting: Family Medicine

## 2012-10-13 ENCOUNTER — Ambulatory Visit (INDEPENDENT_AMBULATORY_CARE_PROVIDER_SITE_OTHER): Payer: Medicare Other | Admitting: Family Medicine

## 2012-10-13 VITALS — BP 118/63 | HR 85 | Temp 98.5°F | Resp 16 | Ht 62.0 in | Wt 116.0 lb

## 2012-10-13 DIAGNOSIS — L299 Pruritus, unspecified: Secondary | ICD-10-CM

## 2012-10-13 DIAGNOSIS — L989 Disorder of the skin and subcutaneous tissue, unspecified: Secondary | ICD-10-CM | POA: Diagnosis not present

## 2012-10-13 DIAGNOSIS — Z23 Encounter for immunization: Secondary | ICD-10-CM

## 2012-10-13 MED ORDER — ZOSTER VACCINE LIVE 19400 UNT/0.65ML ~~LOC~~ SOLR: 0.6500 mL | Freq: Once | SUBCUTANEOUS | Status: DC

## 2012-10-13 MED ORDER — CETIRIZINE HCL 5 MG/5ML PO SYRP: 5.0000 mg | ORAL_SOLUTION | Freq: Every day | ORAL | Status: DC

## 2012-10-13 NOTE — Patient Instructions (Addendum)
I have prescribed an antihistamine at a low dose for you to take in the evening; it is to help reduce or eliminate the itching. I will call you when I have the results of your labs.

## 2012-10-14 LAB — TSH: TSH: 1.746 u[IU]/mL (ref 0.350–4.500)

## 2012-10-14 LAB — T4, FREE: Free T4: 1.11 ng/dL (ref 0.80–1.80)

## 2012-10-14 LAB — T3, FREE: T3, Free: 2.7 pg/mL (ref 2.3–4.2)

## 2012-10-14 NOTE — Progress Notes (Signed)
S:  This 79 y.o. Cauc female is here for re-eval of persistent itchy rash on back, first evaluated by Dr. Cleta Alberts at [facility] on 09/29/12. Pt followed his advise and has used Tricalm topical for 2-3 weeks. She has not been using an antihistamine. Symptoms relief initially w/ cream after 1 week; pt discontinued use as per package instructions. Itchy returned within days so pt sought advise about resuming use of cream. Itchy subsided w/ repeated use of Tricalm. Pt aware of various skin lesions especially on back; DERM specialist periodically sees pt for sin problems. This new problem has not been evaluated by that physician). Pt is interested in Shingles vaccine but wants to be sure this is not shingles since itching and rash are localized to the mid upper back area. Pt does ot reports fever/chills, anorexia, sore throat, swollen glands, GI upset, myalgias/arthralgias, generalized rash, HA, dizziness, lightheadedness or weakness.  PMHx, Soc Hx and Problem List reviewed. Medications reconciled.  ROS: As per HPI.  O: Filed Vitals:   10/13/12 1119  BP: 118/63  Pulse: 85  Temp: 98.5 F (36.9 C)  Resp: 16   GEN: In NAD; WN,WD. HENT: [place]/AT; EOMI w/ clear conj/sclerae. EACs/ nose/ oroph unremarkable. NECK: No LAN or TMG. COR: RRR. LUNGS. Normal resp rate and effort. SKIN: W&D; intact but thin and translucent. Multiple lesion- Seb Ks, strawberry hemagiomas and nevi.            Mid-back area between scapulae/ mid thoracic region- faint red macules w/o vesicular appearance; larger                 Salmon-pink lesion measuring 1x2 cm below L scapula. Increased dryness in general. NEURO: A&O x 3; CNs intact. Nonfocal.  A/P: Pruritus - RX: Cetirizine syrup  5 mg/5 mL  1 tsp (5 mL) every evening for itching.  Plan: TSH, T3, Free, T4, free  Skin lesions, generalized- Atopic dermatitis vs Neurodermatitis vs Pityriasis rosea.  Reassured pt that this did not look like Shingles.  Need for zoster vaccination -  Plan: zoster vaccine live, PF, (ZOSTAVAX) 40347 UNT/0.65ML injection  Meds ordered this encounter  Medications  . zoster vaccine live, PF, (ZOSTAVAX) 42595 UNT/0.65ML injection    Sig: Inject 19,400 Units into the skin once.    Dispense:  1 each    Refill:  0  . cetirizine HCl (ZYRTEC) 5 MG/5ML SYRP    Sig: Take 5 mLs (5 mg total) by mouth daily. Take this medication in the evening.    Dispense:  300 mL    Refill:  1

## 2012-10-15 NOTE — Progress Notes (Signed)
Quick Note:  Please notify pt that results are normal.   Provide pt with copy of labs. ______ 

## 2012-10-20 DIAGNOSIS — N3941 Urge incontinence: Secondary | ICD-10-CM | POA: Diagnosis not present

## 2012-10-20 DIAGNOSIS — N949 Unspecified condition associated with female genital organs and menstrual cycle: Secondary | ICD-10-CM | POA: Diagnosis not present

## 2012-10-20 DIAGNOSIS — N83209 Unspecified ovarian cyst, unspecified side: Secondary | ICD-10-CM | POA: Diagnosis not present

## 2012-10-24 DIAGNOSIS — L821 Other seborrheic keratosis: Secondary | ICD-10-CM | POA: Diagnosis not present

## 2012-10-24 DIAGNOSIS — L723 Sebaceous cyst: Secondary | ICD-10-CM | POA: Diagnosis not present

## 2012-10-24 DIAGNOSIS — D235 Other benign neoplasm of skin of trunk: Secondary | ICD-10-CM | POA: Diagnosis not present

## 2012-10-24 DIAGNOSIS — L299 Pruritus, unspecified: Secondary | ICD-10-CM | POA: Diagnosis not present

## 2012-11-01 ENCOUNTER — Ambulatory Visit: Payer: Medicare Other | Admitting: Family Medicine

## 2012-11-03 ENCOUNTER — Telehealth: Payer: Self-pay | Admitting: Cardiology

## 2012-11-03 NOTE — Telephone Encounter (Signed)
Follow up    Cancel call---pt has to leave

## 2012-11-03 NOTE — Telephone Encounter (Signed)
Left message to call back  

## 2012-11-03 NOTE — Telephone Encounter (Signed)
New Problem  Pt states that she has had Vibrations over her body starting with her feet and legs.. When she sits down she vibrates all over and she wants to know if it has to do with her heart.. She requests a call back to discuss.Marland Kitchen

## 2012-11-03 NOTE — Telephone Encounter (Signed)
Advised patient keep ov for her neuropathy

## 2012-11-03 NOTE — Telephone Encounter (Signed)
Has appointment with Dr Hewitt's PA 11/16/12. Vibrations all over for a week or so and wanted to make sure with  Dr. Patty Sermons that she is seeing correct doctor. Will forward

## 2012-11-03 NOTE — Telephone Encounter (Signed)
Is she seeing him about her feet? He is an excellent foot orthopedist.

## 2012-11-06 ENCOUNTER — Encounter: Payer: Self-pay | Admitting: Family Medicine

## 2012-11-15 ENCOUNTER — Ambulatory Visit (INDEPENDENT_AMBULATORY_CARE_PROVIDER_SITE_OTHER): Payer: Medicare Other | Admitting: *Deleted

## 2012-11-15 DIAGNOSIS — I4891 Unspecified atrial fibrillation: Secondary | ICD-10-CM | POA: Diagnosis not present

## 2012-11-15 LAB — POCT INR: INR: 2.4

## 2012-11-16 DIAGNOSIS — M169 Osteoarthritis of hip, unspecified: Secondary | ICD-10-CM | POA: Diagnosis not present

## 2012-11-21 ENCOUNTER — Other Ambulatory Visit: Payer: Self-pay | Admitting: Cardiology

## 2012-12-04 DIAGNOSIS — IMO0002 Reserved for concepts with insufficient information to code with codable children: Secondary | ICD-10-CM | POA: Diagnosis not present

## 2012-12-04 DIAGNOSIS — M5137 Other intervertebral disc degeneration, lumbosacral region: Secondary | ICD-10-CM | POA: Diagnosis not present

## 2012-12-26 ENCOUNTER — Other Ambulatory Visit: Payer: Self-pay | Admitting: *Deleted

## 2012-12-26 DIAGNOSIS — K3189 Other diseases of stomach and duodenum: Secondary | ICD-10-CM | POA: Diagnosis not present

## 2012-12-26 DIAGNOSIS — R1013 Epigastric pain: Secondary | ICD-10-CM | POA: Diagnosis not present

## 2012-12-26 MED ORDER — WARFARIN SODIUM 2 MG PO TABS: 2.0000 mg | ORAL_TABLET | ORAL | Status: DC

## 2012-12-27 ENCOUNTER — Ambulatory Visit (INDEPENDENT_AMBULATORY_CARE_PROVIDER_SITE_OTHER): Payer: Medicare Other | Admitting: *Deleted

## 2012-12-27 DIAGNOSIS — I4891 Unspecified atrial fibrillation: Secondary | ICD-10-CM

## 2012-12-27 LAB — POCT INR: INR: 2.8

## 2012-12-29 ENCOUNTER — Encounter: Payer: Self-pay | Admitting: Neurology

## 2012-12-29 ENCOUNTER — Ambulatory Visit (INDEPENDENT_AMBULATORY_CARE_PROVIDER_SITE_OTHER): Payer: Medicare Other | Admitting: Neurology

## 2012-12-29 VITALS — BP 156/73 | HR 81 | Ht 62.0 in | Wt 116.0 lb

## 2012-12-29 DIAGNOSIS — H539 Unspecified visual disturbance: Secondary | ICD-10-CM

## 2012-12-29 NOTE — Patient Instructions (Signed)
Overall you are doing fairly well but I do want to suggest a few things today:   Remember to drink plenty of fluid, eat healthy meals and do not skip any meals. Try to eat protein with a every meal and eat a healthy snack such as fruit or nuts in between meals. Try to keep a regular sleep-wake schedule and try to exercise daily, particularly in the form of walking, 20-30 minutes a day, if you can.   Your symptoms may be related to a small stroke you had in the back part of your brain  As far as your medications are concerned, I would like to suggest  As far as diagnostic testing:   I would like to see you back in XXXXX, sooner if we need to. Please call us with any interim questions, concerns, problems, updates or refill requests.   Please also call us for any test results so we can go over those with you on the phone.  My clinical assistant and will answer any of your questions and relay your messages to me and also relay most of my messages to you.   Our phone number is [phone]. We also have an after hours call service for urgent matters and there is a physician on-call for urgent questions. For any emergencies you know to call 911 or go to the nearest emergency room

## 2012-12-29 NOTE — Progress Notes (Signed)
[facility]    Provider:  Dr Hosie Poisson Referring Provider: Maurice March, MD Primary Care Physician:  Dow Adolph, MD  CC:  Visual changes  HPI:  Vanessa [name] is a 79 y.o. female here as a referral from Dr. Audria Nine for visual changes. Has not had an episode in 2 weeks.   For the past few months has been noticing episodes of seeing wavy lines, like ocean waves lasting <1 minute, also seeing checkerboard pattern. Was happening a few times per week. Altered mental status during event, no oral ultimate systems no eye blinking no lip smacking no extremity twitching. No focal weakness or motor or sensory changes. No vision changes otherwise during this event or other times. Had formal eye exam recently which was unremarkable.  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   Review of Systems: Out of a complete 14 system review, the patient complains of only the following symptoms, and all other reviewed systems are negative. Positive joint pain hearing loss allergies runny nose skin sensitivity itching  History   Social History  . Marital Status: Widowed    Spouse Name: N/A    Number of Children: N/A  . Years of Education: 12+   Occupational History  . Not on file.   Social History Main Topics  . Smoking status: Never Smoker   . Smokeless tobacco: Never Used  . Alcohol Use: No  . Drug Use: No  . Sexual Activity: No   Other Topics Concern  . Not on file   Social History Narrative   Patient lives at home alone.    Patient is a widowed.    Patient is retired.    Patient has no children but adopted twin sons.    Patient has a college degree.     Family History  Problem Relation Age of Onset  . Heart disease    . Diabetes Mother   . Heart attack Mother   . Heart disease Mother   . Heart attack Father   . Stroke Father   . Cancer Sister   . Heart disease Maternal  Grandmother     Past Medical History  Diagnosis Date  . Atrial fibrillation   . Neuropathy     PERIPHERAL  . Carotid artery occlusion     LEFT  . Scoliosis   . Hoarseness   . Headache(784.0)   . Dizziness   . Abdominal bloating   . Bruises easily   . Pruritus   . Hypercholesterolemia   . Fall at home Sept 2013, Dec. 2013  Jun 08, 2012    Past Surgical History  Procedure Laterality Date  . Abdominal hysterectomy    . Spine surgery  1997  . Gallbladder surgery    . Cholecystectomy      Gal Bladder    Current Outpatient Prescriptions  Medication Sig Dispense Refill  . ALPRAZolam (XANAX) 0.25 MG tablet Take 1/2 tablet at bedtime as needed for sleep.  30 tablet  1  . atenolol (TENORMIN) 25 MG tablet TAKE 1 TABLET TWICE A DAY  60 tablet  2  . BIOTIN 5000 PO Take by mouth daily.      . Calcium-Vitamin D-Vitamin K (VIACTIV PO) Take by mouth.      . cetaphil (CETAPHIL) cream Apply topically as needed.      . cetirizine HCl (ZYRTEC) 5 MG/5ML SYRP Take 5 mLs (5 mg total) by mouth daily. Take  this medication in the evening.  300 mL  1  . Multiple Vitamin (MULTIVITAMIN) tablet Take 1 tablet by mouth daily.        . simvastatin (ZOCOR) 40 MG tablet Take 0.5 tablets (20 mg total) by mouth daily.  30 tablet  5  . traMADol (ULTRAM) 50 MG tablet Take 1 tablet (50 mg total) by mouth every 6 (six) hours as needed for pain.  15 tablet  0  . warfarin (COUMADIN) 2 MG tablet Take 1 tablet (2 mg total) by mouth as directed.  40 tablet  1  . zoster vaccine live, PF, (ZOSTAVAX) [address] UNT/0.65ML injection Inject 19,400 Units into the skin once.  1 each  0   No current facility-administered medications for this visit.    Allergies as of 12/29/2012 - Review Complete 12/29/2012  Allergen Reaction Noted  . Tape Other (See Comments) 04/29/2011    Vitals: BP 156/73  Pulse 81  Ht 5\' 2"  (1.575 m)  Wt 116 lb (52.617 kg)  BMI 21.21 kg/m2 Last Weight:  Wt Readings from Last 1 Encounters:    12/29/12 116 lb (52.617 kg)   Last Height:   Ht Readings from Last 1 Encounters:  12/29/12 5\' 2"  (1.575 m)     Physical exam: Exam: Gen: NAD, conversant Eyes: anicteric sclerae, moist conjunctivae HENT: Atraumatic, oropharynx clear Neck: Trachea midline; supple,  Lungs: CTA, no wheezing, rales, rhonic                          CV: RRR, no MRG Abdomen: Soft, non-tender;  Extremities: No peripheral edema  Skin: Normal temperature, no rash,  Psych: Appropriate affect, pleasant  Neuro: MS: AA&Ox3, appropriately interactive, normal affect   Speech: fluent w/o paraphasic error  Memory: good recent and remote recall  CN: PERRL, EOMI no nystagmus, visual fields full to finger count bilaterally, unable to fully visualize fundus due to pupil size, no ptosis, sensation intact to LT V1-V3 bilat, face symmetric, no weakness, hearing grossly intact, palate elevates symmetrically, shoulder shrug 5/5 bilat,  tongue protrudes midline, no fasiculations noted.  Motor: normal bulk and tone Strength: 5/5  In all extremities  Coord: rapid alternating and point-to-point (FNF, HTS) movements intact.  Reflexes: symmetrical, bilat downgoing toes  Sens: LT intact in all extremities  Gait: posture, stance, stride and arm-swing normal. Tandem gait intact. Able to walk on heels and toes. Romberg absent.   Assessment:  After physical and neurologic examination, review of laboratory studies, imaging, neurophysiology testing and pre-existing records, assessment will be reviewed on the problem list.  Plan:  Treatment plan and additional workup will be reviewed under Problem List.  1)visual changes  37 -year-old woman presenting for initial evaluation of visual changes, described as transient episodes of wavy lines and checkerboard pattern. Typically last seconds to less than 1 minute. She has not had one of these episodes in 2 weeks. Prior to that they were happening a few times a day. No altered  mental status during these events. She does have a  history of a left parietal occipital infarct based on old MRI imaging. Reports having a unremarkable eye exam recently. From a neurological standpoint the differential would include new posterior infarct or partial seizure related to prior occipital parietal infarct. Discussed these possibilities with the patient, as she is currently asymptomatic we will not proceed with further testing at this time. If symptoms return can consider EEG and or brain MRI. Followup as needed.  Jim Like, DO  [facility] [address] [place], [place] [address]  Phone [phone] Fax [phone]

## 2013-01-08 DIAGNOSIS — M19079 Primary osteoarthritis, unspecified ankle and foot: Secondary | ICD-10-CM | POA: Diagnosis not present

## 2013-01-24 DIAGNOSIS — M549 Dorsalgia, unspecified: Secondary | ICD-10-CM | POA: Diagnosis not present

## 2013-01-24 DIAGNOSIS — R82998 Other abnormal findings in urine: Secondary | ICD-10-CM | POA: Diagnosis not present

## 2013-02-05 ENCOUNTER — Ambulatory Visit (INDEPENDENT_AMBULATORY_CARE_PROVIDER_SITE_OTHER): Payer: Medicare Other | Admitting: Cardiology

## 2013-02-05 ENCOUNTER — Encounter: Payer: Self-pay | Admitting: Cardiology

## 2013-02-05 ENCOUNTER — Ambulatory Visit (INDEPENDENT_AMBULATORY_CARE_PROVIDER_SITE_OTHER): Payer: Medicare Other

## 2013-02-05 VITALS — BP 124/58 | HR 84 | Ht 62.0 in | Wt 117.0 lb

## 2013-02-05 DIAGNOSIS — I6529 Occlusion and stenosis of unspecified carotid artery: Secondary | ICD-10-CM

## 2013-02-05 DIAGNOSIS — E7800 Pure hypercholesterolemia, unspecified: Secondary | ICD-10-CM

## 2013-02-05 DIAGNOSIS — R5381 Other malaise: Secondary | ICD-10-CM | POA: Diagnosis not present

## 2013-02-05 DIAGNOSIS — I4891 Unspecified atrial fibrillation: Secondary | ICD-10-CM

## 2013-02-05 DIAGNOSIS — Z5181 Encounter for therapeutic drug level monitoring: Secondary | ICD-10-CM

## 2013-02-05 DIAGNOSIS — R5383 Other fatigue: Secondary | ICD-10-CM

## 2013-02-05 DIAGNOSIS — Z7901 Long term (current) use of anticoagulants: Secondary | ICD-10-CM

## 2013-02-05 LAB — POCT INR: INR: 2.8

## 2013-02-05 NOTE — Assessment & Plan Note (Signed)
The patient has had no TIA symptoms.  She remains in atrial fibrillation controlled ventricular response.  Chadsvasc score is 5

## 2013-02-05 NOTE — Assessment & Plan Note (Signed)
Patient continues to have a lot of malaise and fatigue although for her age she does accomplish quite a lot for herself.  She is still able to drive and handle her own affairs.

## 2013-02-05 NOTE — Patient Instructions (Signed)
Your physician recommends that you continue on your current medications as directed. Please refer to the Current Medication list given to you today.  Your physician wants you to follow-up in: 4 months with fasting labs (lp/bmet/hfp/cbc)  You will receive a reminder letter in the mail two months in advance. If you don't receive a letter, please call our office to schedule the follow-up appointment.  

## 2013-02-05 NOTE — Assessment & Plan Note (Signed)
Patient has hypercholesterolemia and is on simvastatin 20 mg daily.  She is not having any myalgias from the simvastatin.

## 2013-02-05 NOTE — Progress Notes (Signed)
Vanessa [name] Date of Birth:  [birth year] [address] [place], [place]  [address] [phone]         Fax   [phone]  History of Present Illness: This pleasant 80 year old widowed woman is seen for a scheduled four-month followup office visit. She has a history of atypical chest pain. She was seen in March 2013 with these symptoms and underwent a nuclear stress test subsequently which showed no ischemia. The patient reports that her symptoms have improved. She recently saw Dr. Cristina Gong who placed her on Nexium for dysphagia. She reports improvement in those symptoms since starting on Nexium. She has had a history of frequent falls.  She has not fallen since we last saw her 4 months ago. She is on long-term Coumadin because of her chronic atrial flutter fibrillation. She has a history of vascular disease with known chronic occlusion of her left carotid artery.  She has a past history of high blood pressure.    Her CHADSSVASC  score is 5 (for age 34, female sex, high blood pressure, and vascular disease with occluded left internal carotid artery).  Since last visit she has been having a lot of problems with her varicose veins in with her peripheral neuropathy which frequently wakes her up at night.  She has also been having some problems with her teeth and is going to have to have some additional dental work.  Since we last saw her she has not had any more severe falls.   Current Outpatient Prescriptions  Medication Sig Dispense Refill  . ALPRAZolam (XANAX) 0.25 MG tablet Take 1/2 tablet at bedtime as needed for sleep.  30 tablet  1  . atenolol (TENORMIN) 25 MG tablet TAKE 1 TABLET TWICE A DAY  60 tablet  2  . BIOTIN 5000 PO Take by mouth daily.      . Calcium-Vitamin D-Vitamin K (VIACTIV PO) Take by mouth.      . cetaphil (CETAPHIL) cream Apply topically as needed.      . cetirizine HCl (ZYRTEC) 5 MG/5ML SYRP Take 5 mLs (5 mg total) by mouth daily. Take this medication in  the evening.  300 mL  1  . Multiple Vitamin (MULTIVITAMIN) tablet Take 1 tablet by mouth daily.        Marland Kitchen omeprazole (PRILOSEC) 20 MG capsule Take 20 mg by mouth daily.      . simvastatin (ZOCOR) 40 MG tablet Take 0.5 tablets (20 mg total) by mouth daily.  30 tablet  5  . traMADol (ULTRAM) 50 MG tablet Take 1 tablet (50 mg total) by mouth every 6 (six) hours as needed for pain.  15 tablet  0  . warfarin (COUMADIN) 2 MG tablet Take 1 tablet (2 mg total) by mouth as directed.  40 tablet  1  . zoster vaccine live, PF, (ZOSTAVAX) 29562 UNT/0.65ML injection Inject 19,400 Units into the skin once.  1 each  0   No current facility-administered medications for this visit.    Allergies  Allergen Reactions  . Tape Other (See Comments)    Tears skin.    Patient Active Problem List   Diagnosis Date Noted  . Raynaud phenomenon 06/26/2010    Priority: High  . Osteoarthritis 06/26/2010    Priority: High  . Neuropathy     Priority: High  . Long term (current) use of anticoagulants 02/05/2013  . Encounter for therapeutic drug monitoring 02/05/2013  . Occlusion and stenosis of carotid artery without mention of cerebral  infarction 08/16/2012  . Varicose veins of lower extremities with other complications 07/04/7626  . Malaise and fatigue 10/06/2011  . Chest pain 04/02/2011  . Atrial fibrillation   . Hypercholesterolemia   . Scoliosis   . Hoarseness   . Headache   . Dizziness   . Bruises easily   . Pruritus     History  Smoking status  . Never Smoker   Smokeless tobacco  . Never Used    History  Alcohol Use No    Family History  Problem Relation Age of Onset  . Heart disease    . Diabetes Mother   . Heart attack Mother   . Heart disease Mother   . Heart attack Father   . Stroke Father   . Cancer Sister   . Heart disease Maternal Grandmother     Review of Systems: Constitutional: no fever chills diaphoresis or fatigue or change in weight.  Head and neck: no hearing loss,  no epistaxis, no photophobia or visual disturbance. Respiratory: No cough, shortness of breath or wheezing. Cardiovascular: No chest pain peripheral edema, palpitations. Gastrointestinal: No abdominal distention, no abdominal pain, no change in bowel habits hematochezia or melena. Genitourinary: No dysuria, no frequency, no urgency, no nocturia. Musculoskeletal:No arthralgias, no back pain, no gait disturbance or myalgias. Neurological: No dizziness, no headaches, no numbness, no seizures, no syncope, no weakness, no tremors. Hematologic: No lymphadenopathy, no easy bruising. Psychiatric: No confusion, no hallucinations, no sleep disturbance.    Physical Exam: Filed Vitals:   02/05/13 1428  BP: 124/58  Pulse: 84   the general appearance reveals an elderly woman in no distress.  She has an aide with her.  She has resolving ecchymosis of the left side of her face.The head and neck exam reveals pupils equal and reactive.  Extraocular movements are full.  There is no scleral icterus.  The mouth and pharynx are normal.  The neck is supple.  The carotids reveal no bruits.  The jugular venous pressure is normal.  The  thyroid is not enlarged.  There is no lymphadenopathy.  The chest is clear to percussion and auscultation.  There are no rales or rhonchi.  Expansion of the chest is symmetrical.  The precordium is quiet.  The pulse is irregularly irregular The first heart sound is normal.  The second heart sound is physiologically split.  There is no murmur gallop rub or click.  There is no abnormal lift or heave.  The abdomen is soft and nontender.  The bowel sounds are normal.  The liver and spleen are not enlarged.  There are no abdominal masses.  There are no abdominal bruits.  Extremities reveal good pedal pulses.  There is no phlebitis or edema.  There is no cyanosis or clubbing.  Strength is normal and symmetrical in all extremities.  There is no lateralizing weakness.  There are no sensory deficits.   The skin is warm and dry.  There is no rash.  She does have several actinic areas on the face that she will be discussing with Dr. Nevada Crane, her dermatologist    Assessment / Plan: We discussed the pros and cons of anticoagulation with warfarin versus no anticoagulation.  She has a chadssvasc  score of 5.  I recommend continuing warfarin.  Fortunately she has not had any further falls Continue same medication.  Return in 4 months for followup office visit fasting lipid panel hepatic function panel i and basal metabolic panel and CBC and EKG.  Vanessa [name] Date of Birth:  Jan 01, 1921 [facility] [address] [place], [place]  [address] [phone]         Fax   [phone]  History of Present Illness: This pleasant 80 year old widowed woman is seen for a scheduled four-month followup office visit. She has a history of atypical chest pain. She was seen in March 2013 with these symptoms and underwent a nuclear stress test subsequently which showed no ischemia. The patient reports that her symptoms have improved. She recently saw Dr. Cristina Gong who placed her on Nexium for dysphagia. She reports improvement in those symptoms since starting on Nexium. She has had a history of frequent falls.  She has not fallen since we last saw her 4 months ago. She is on long-term Coumadin because of her chronic atrial flutter fibrillation. She has a history of vascular disease with known chronic occlusion of her left carotid artery.  She has a past history of high blood pressure.    Her CHADSSVASC  score is 5 (for age 84, female sex, high blood pressure, and vascular disease with occluded left internal carotid artery).  Since we last saw her she has been having a lot of dermatologic problems and has an appointment to see her new dermatologist Dr. Nevada Crane soon.   Current Outpatient Prescriptions  Medication Sig Dispense Refill  . ALPRAZolam (XANAX) 0.25 MG tablet Take 1/2 tablet at bedtime  as needed for sleep.  30 tablet  1  . atenolol (TENORMIN) 25 MG tablet TAKE 1 TABLET TWICE A DAY  60 tablet  2  . BIOTIN 5000 PO Take by mouth daily.      . Calcium-Vitamin D-Vitamin K (VIACTIV PO) Take by mouth.      . cetaphil (CETAPHIL) cream Apply topically as needed.      . cetirizine HCl (ZYRTEC) 5 MG/5ML SYRP Take 5 mLs (5 mg total) by mouth daily. Take this medication in the evening.  300 mL  1  . Multiple Vitamin (MULTIVITAMIN) tablet Take 1 tablet by mouth daily.        Marland Kitchen omeprazole (PRILOSEC) 20 MG capsule Take 20 mg by mouth daily.      . simvastatin (ZOCOR) 40 MG tablet Take 0.5 tablets (20 mg total) by mouth daily.  30 tablet  5  . traMADol (ULTRAM) 50 MG tablet Take 1 tablet (50 mg total) by mouth every 6 (six) hours as needed for pain.  15 tablet  0  . warfarin (COUMADIN) 2 MG tablet Take 1 tablet (2 mg total) by mouth as directed.  40 tablet  1  . zoster vaccine live, PF, (ZOSTAVAX) 13086 UNT/0.65ML injection Inject 19,400 Units into the skin once.  1 each  0   No current facility-administered medications for this visit.    Allergies  Allergen Reactions  . Tape Other (See Comments)    Tears skin.    Patient Active Problem List   Diagnosis Date Noted  . Raynaud phenomenon 06/26/2010    Priority: High  . Osteoarthritis 06/26/2010    Priority: High  . Neuropathy     Priority: High  . Long term (current) use of anticoagulants 02/05/2013  . Encounter for therapeutic drug monitoring 02/05/2013  . Occlusion and stenosis of carotid artery without mention of cerebral infarction 08/16/2012  . Varicose veins of lower extremities with other complications 57/84/6962  . Malaise and fatigue 10/06/2011  . Chest pain 04/02/2011  . Atrial fibrillation   . Hypercholesterolemia   . Scoliosis   .  Hoarseness   . Headache   . Dizziness   . Bruises easily   . Pruritus     History  Smoking status  . Never Smoker   Smokeless tobacco  . Never Used    History  Alcohol Use  No    Family History  Problem Relation Age of Onset  . Heart disease    . Diabetes Mother   . Heart attack Mother   . Heart disease Mother   . Heart attack Father   . Stroke Father   . Cancer Sister   . Heart disease Maternal Grandmother     Review of Systems: Constitutional: no fever chills diaphoresis or fatigue or change in weight.  Head and neck: no hearing loss, no epistaxis, no photophobia or visual disturbance. Respiratory: No cough, shortness of breath or wheezing. Cardiovascular: No chest pain peripheral edema, palpitations. Gastrointestinal: No abdominal distention, no abdominal pain, no change in bowel habits hematochezia or melena. Genitourinary: No dysuria, no frequency, no urgency, no nocturia. Musculoskeletal:No arthralgias, no back pain, no gait disturbance or myalgias. Neurological: No dizziness, no headaches, no numbness, no seizures, no syncope, no weakness, no tremors. Hematologic: No lymphadenopathy, no easy bruising. Psychiatric: No confusion, no hallucinations, no sleep disturbance.    Physical Exam: Filed Vitals:   02/05/13 1428  BP: 124/58  Pulse: 84   the general appearance reveals an elderly woman in no distress.  She has an aide with her.  She has resolving ecchymosis of the left side of her face.The head and neck exam reveals pupils equal and reactive.  Extraocular movements are full.  There is no scleral icterus.  The mouth and pharynx are normal.  The neck is supple.  The carotids reveal no bruits.  The jugular venous pressure is normal.  The  thyroid is not enlarged.  There is no lymphadenopathy.  The chest is clear to percussion and auscultation.  There are no rales or rhonchi.  Expansion of the chest is symmetrical.  The precordium is quiet.  The pulse is irregularly irregular The first heart sound is normal.  The second heart sound is physiologically split.  There is no murmur gallop rub or click.  There is no abnormal lift or heave.  The abdomen  is soft and nontender.  The bowel sounds are normal.  The liver and spleen are not enlarged.  There are no abdominal masses.  There are no abdominal bruits.  Extremities reveal good pedal pulses.  She does have significant varicose veins on her lower legs and knees are being followed by vascular There is no phlebitis or edema.  There is no cyanosis or clubbing.  Strength is normal and symmetrical in all extremities.  There is no lateralizing weakness.  There are no sensory deficits.  The skin is warm and dry.  There is no rash.  She does have several actinic areas on the face that she will be discussing with Dr. Nevada Crane, her dermatologist  EKG today shows atrial fibrillation with moderate voltage criteria for LVH.  Assessment / Plan: Overall the patient has been stable.  Continue same medication.  Recheck in 4 months for office visit CBC lipid panel hepatic function panel and basal metabolic panel

## 2013-02-08 ENCOUNTER — Telehealth: Payer: Self-pay | Admitting: Cardiology

## 2013-02-08 ENCOUNTER — Telehealth: Payer: Self-pay

## 2013-02-08 DIAGNOSIS — I739 Peripheral vascular disease, unspecified: Secondary | ICD-10-CM

## 2013-02-08 NOTE — Telephone Encounter (Signed)
New message  Patient wants to speak with you regarding a upcoming dental surgery she is having. She wouldn't give me a lot of information other than she needed to speak with you.

## 2013-02-08 NOTE — Telephone Encounter (Signed)
Phone call from pt. with c/o a sensation like blood is "pumping/ gushing through the legs".  States the pumping/gushing sensation is also noticeable up to the buttocks, at times, and "it keeps me awake at night".  Denies any pain associated with this.  Denies any other symptoms.  Reports she is scheduled to undergo a major dental procedure next week, and questions if there is a reason to postpone this, with the symptoms in her legs.  Discussed with Dr. Oneida Alar, in the office today, and    he recommended to schedule a new patient visit, and ABI's to evaluate for PAD; he advised she did not need to postpone her upcoming dental procedure.  Attempted to contact the pt.  Left voice message re: Dr. Oneida Alar recommendation, and also that she will be contacted with an appt.

## 2013-02-08 NOTE — Telephone Encounter (Signed)
Spoke with patient and she concerned about the dental surgery and the leg problems she is having. Discussed with  Dr. Mare Ferrari and should not be any problems. Left message to call back

## 2013-02-12 ENCOUNTER — Telehealth: Payer: Self-pay | Admitting: *Deleted

## 2013-02-12 NOTE — Telephone Encounter (Signed)
Follow Up:  Pt is returning Melinda's call.

## 2013-02-12 NOTE — Telephone Encounter (Signed)
Follow up:  Pt is calling again to speak w/ Rip Harbour.

## 2013-02-12 NOTE — Telephone Encounter (Signed)
Patient wants to know if ok to have dental procedure done this week.  Last office visit was 12/29/2012.  Please advise.

## 2013-02-12 NOTE — Telephone Encounter (Signed)
To WID____Patient wants to know if she is cleared to have a dental procedure on Wednesday.  Patient states she has a tooth that has broken off and they will be digging it out.  Last office visit was 12/29/2012.  Please call back today or early tomorrow morning has another appointment tomorrow at 10:00 am.

## 2013-02-12 NOTE — Telephone Encounter (Signed)
Spoke with patient and  Dr. Sherryl Barters recommendations given. Also scheduled her INR for Wednesday before dental procedure

## 2013-02-12 NOTE — Telephone Encounter (Signed)
F/u   Pt need to speak to [facility] about coumadin check.

## 2013-02-13 NOTE — Telephone Encounter (Signed)
Pt called and would like to speak with Dr. Janann Colonel regarding her eye problem.  She stated that Saturday morning while she was eating breakfast everything in the room seemed jumbled up.  She does have dental surgery tomorrow and will not be able to talk.  So if someone could please call her today.

## 2013-02-14 ENCOUNTER — Ambulatory Visit (INDEPENDENT_AMBULATORY_CARE_PROVIDER_SITE_OTHER): Payer: Medicare Other | Admitting: Pharmacist

## 2013-02-14 DIAGNOSIS — I4891 Unspecified atrial fibrillation: Secondary | ICD-10-CM | POA: Diagnosis not present

## 2013-02-14 DIAGNOSIS — Z5181 Encounter for therapeutic drug level monitoring: Secondary | ICD-10-CM | POA: Diagnosis not present

## 2013-02-14 LAB — POCT INR: INR: 2.5

## 2013-02-14 NOTE — Telephone Encounter (Signed)
PT called back again.  The spell she had on Saturday morning really frightened her and she wants to know what she should do.  Her ophthalmologist stated that her eyes were ok.  The spell last maybe 2 minutes.  She would like someone, either the nurse or Dr. Janann Colonel to call her and let her know what she should do. She asked if someone could call her today or the very first thing in the morning.  Thank you

## 2013-02-19 ENCOUNTER — Ambulatory Visit (INDEPENDENT_AMBULATORY_CARE_PROVIDER_SITE_OTHER): Payer: Medicare Other | Admitting: Neurology

## 2013-02-19 ENCOUNTER — Encounter: Payer: Self-pay | Admitting: Neurology

## 2013-02-19 VITALS — BP 152/73 | HR 77 | Ht 62.0 in | Wt 116.0 lb

## 2013-02-19 DIAGNOSIS — H538 Other visual disturbances: Secondary | ICD-10-CM | POA: Diagnosis not present

## 2013-02-19 NOTE — Progress Notes (Signed)
[facility]    Provider:  Dr Janann Colonel Referring Provider: Barton Fanny, MD Primary Care Physician:  Ellsworth Lennox, MD  CC:  Visual changes  HPI:  Vanessa [name] is a 80 y.o. female here as a follow up from Dr. Leward Quan for visual changes. Returns today after having a further episode of visual disturbance. Continues to describe episode as a "checkerboard" pattern that disrupted her vision. Lasted around 1 minute, no LOC, no focal motor or sensory changes. No other acute concerns at this time.      Initial visit: For the past few months has been noticing episodes of seeing wavy lines, like ocean waves lasting <1 minute, also seeing checkerboard pattern. Was happening a few times per week. Altered mental status during event, no oral ultimate systems no eye blinking no lip smacking no extremity twitching. No focal weakness or motor or sensory changes. No vision changes otherwise during this event or other times. Had formal eye exam recently which was unremarkable.  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   Review of Systems: Out of a complete 14 system review, the patient complains of only the following symptoms, and all other reviewed systems are negative. Positive cold intolerance, allergies, bruise, incontinence of bladder  History   Social History  . Marital Status: Widowed    Spouse Name: N/A    Number of Children: 2  . Years of Education: 12+   Occupational History  . Not on file.   Social History Main Topics  . Smoking status: Never Smoker   . Smokeless tobacco: Never Used  . Alcohol Use: No  . Drug Use: No  . Sexual Activity: No   Other Topics Concern  . Not on file   Social History Narrative   Patient lives at home alone.    Patient is a widowed.    Patient is retired.    Patient has no children but adopted twin sons.    Patient has a college degree.      Family History  Problem Relation Age of Onset  . Heart disease    . Diabetes Mother   . Heart attack Mother   . Heart disease Mother   . Heart attack Father   . Stroke Father   . Cancer Sister   . Heart disease Maternal Grandmother     Past Medical History  Diagnosis Date  . Atrial fibrillation   . Neuropathy     PERIPHERAL  . Carotid artery occlusion     LEFT  . Scoliosis   . Hoarseness   . Headache(784.0)   . Dizziness   . Abdominal bloating   . Bruises easily   . Pruritus   . Hypercholesterolemia   . Fall at home Sept 2013, Dec. 2013  Jun 08, 2012    Past Surgical History  Procedure Laterality Date  . Abdominal hysterectomy    . Spine surgery  1997  . Gallbladder surgery    . Cholecystectomy      Gal Bladder    Current Outpatient Prescriptions  Medication Sig Dispense Refill  . ALPRAZolam (XANAX) 0.25 MG tablet Take 1/2 tablet at bedtime as needed for sleep.  30 tablet  1  . atenolol (TENORMIN) 25 MG tablet TAKE 1 TABLET TWICE A DAY  60 tablet  2  . BIOTIN 5000 PO Take by mouth daily.      . Calcium-Vitamin D-Vitamin K (VIACTIV PO) Take by  mouth.      . cetaphil (CETAPHIL) cream Apply topically as needed.      . cetirizine HCl (ZYRTEC) 5 MG/5ML SYRP Take 5 mLs (5 mg total) by mouth daily. Take this medication in the evening.  300 mL  1  . Multiple Vitamin (MULTIVITAMIN) tablet Take 1 tablet by mouth daily.        Marland Kitchen omeprazole (PRILOSEC) 20 MG capsule Take 20 mg by mouth daily.      . simvastatin (ZOCOR) 40 MG tablet Take 0.5 tablets (20 mg total) by mouth daily.  30 tablet  5  . traMADol (ULTRAM) 50 MG tablet Take 1 tablet (50 mg total) by mouth every 6 (six) hours as needed for pain.  15 tablet  0  . warfarin (COUMADIN) 2 MG tablet Take 1 tablet (2 mg total) by mouth as directed.  40 tablet  1  . zoster vaccine live, PF, (ZOSTAVAX) 01093 UNT/0.65ML injection Inject 19,400 Units into the skin once.  1 each  0   No current facility-administered  medications for this visit.    Allergies as of 02/19/2013 - Review Complete 02/19/2013  Allergen Reaction Noted  . Tape Other (See Comments) 04/29/2011    Vitals: BP 152/73  Pulse 77  Ht 5\' 2"  (1.575 m)  Wt 116 lb (52.617 kg)  BMI 21.21 kg/m2 Last Weight:  Wt Readings from Last 1 Encounters:  02/19/13 116 lb (52.617 kg)   Last Height:   Ht Readings from Last 1 Encounters:  02/19/13 5\' 2"  (1.575 m)     Physical exam: Exam: Gen: NAD, conversant Eyes: anicteric sclerae, moist conjunctivae HENT: Atraumatic, oropharynx clear Neck: Trachea midline; supple,  Lungs: CTA, no wheezing, rales, rhonic                          CV: RRR, no MRG Abdomen: Soft, non-tender;  Extremities: No peripheral edema  Skin: Normal temperature, no rash,  Psych: Appropriate affect, pleasant  Neuro: MS: AA&Ox3, appropriately interactive, normal affect   Speech: fluent w/o paraphasic error  Memory: good recent and remote recall  CN: PERRL, EOMI no nystagmus, visual fields full to finger count bilaterally, unable to fully visualize fundus due to pupil size, no ptosis, sensation intact to LT V1-V3 bilat, face symmetric, no weakness, hearing grossly intact, palate elevates symmetrically, shoulder shrug 5/5 bilat,  tongue protrudes midline, no fasiculations noted.  Motor: normal bulk and tone Strength: 5/5  In all extremities  Coord: rapid alternating and point-to-point (FNF, HTS) movements intact.  Reflexes: symmetrical, bilat downgoing toes  Sens: LT intact in all extremities  Gait: posture, stance, stride and arm-swing normal. Tandem gait intact. Able to walk on heels and toes. Romberg absent.   Assessment:  After physical and neurologic examination, review of laboratory studies, imaging, neurophysiology testing and pre-existing records, assessment will be reviewed on the problem list.  Plan:  Treatment plan and additional workup will be reviewed under Problem List.  1)visual  changes  46 -year-old woman presenting for follow up evaluation of visual changes, described as transient episodes of wavy lines and checkerboard pattern. Typically last seconds to less than 1 minute. Returns today after having had a repeat episode. Had recent eye exam which was unremarkable. From a neurological standpoint the differential would include new posterior infarct or partial seizure related to prior occipital parietal infarct. Discussed these possibilities with the patient, at this time she wishes to proceed with further testing. Will order CT head  and EEG. Follow up once workup completed.    Jim Like, DO  [facility] [address] [place], [place] [address]  Phone [phone] Fax [phone]

## 2013-02-19 NOTE — Patient Instructions (Signed)
Overall you are doing fairly well but I do want to suggest a few things today:   As far as diagnostic testing:  1)Please schedule an EEG when you checkout 2)I would like you to have a CT scan of your head. Someone will call you to schedule this  We will follow up once the above is completed. Please call us with any interim questions, concerns, problems, updates or refill requests.   Please also call us for any test results so we can go over those with you on the phone.  My clinical assistant and will answer any of your questions and relay your messages to me and also relay most of my messages to you.   Our phone number is [phone]. We also have an after hours call service for urgent matters and there is a physician on-call for urgent questions. For any emergencies you know to call 911 or go to the nearest emergency room

## 2013-02-20 ENCOUNTER — Encounter: Payer: Self-pay | Admitting: Vascular Surgery

## 2013-02-21 ENCOUNTER — Encounter: Payer: Self-pay | Admitting: Vascular Surgery

## 2013-02-21 ENCOUNTER — Ambulatory Visit (INDEPENDENT_AMBULATORY_CARE_PROVIDER_SITE_OTHER): Payer: Medicare Other | Admitting: Vascular Surgery

## 2013-02-21 ENCOUNTER — Ambulatory Visit (HOSPITAL_COMMUNITY)
Admission: RE | Admit: 2013-02-21 | Discharge: 2013-02-21 | Disposition: A | Discharge status: Home / Self Care | Payer: Medicare Other | Source: Ambulatory Visit | Attending: Vascular Surgery | Admitting: Vascular Surgery

## 2013-02-21 VITALS — BP 125/74 | HR 65 | Ht 62.0 in | Wt 117.0 lb

## 2013-02-21 DIAGNOSIS — I739 Peripheral vascular disease, unspecified: Secondary | ICD-10-CM

## 2013-02-21 DIAGNOSIS — I10 Essential (primary) hypertension: Secondary | ICD-10-CM | POA: Insufficient documentation

## 2013-02-21 DIAGNOSIS — I6529 Occlusion and stenosis of unspecified carotid artery: Secondary | ICD-10-CM

## 2013-02-21 DIAGNOSIS — I83893 Varicose veins of bilateral lower extremities with other complications: Secondary | ICD-10-CM | POA: Diagnosis not present

## 2013-02-21 DIAGNOSIS — E785 Hyperlipidemia, unspecified: Secondary | ICD-10-CM | POA: Insufficient documentation

## 2013-02-21 NOTE — Progress Notes (Signed)
[facility]  Patient name: Vanessa [name] MRN: [id number] DOB: Jan 20, 1920 Sex: female  REASON FOR VISIT: Pain in the legs  HPI: Vanessa [name] is a 80 y.o. female who I last saw on 08/16/2012. She has varicose veins and when I saw her last discussed the importance of intermittent leg elevation. She is unable to wear compression stockings as she has a difficult time getting them on. She comes in today because of multiple complaints in her lower extremities. She describes pain in her right hip which sounds like arthritic pain. She describes an aching pain in her legs likely related to her chronic venous insufficiency. There is no history of rest pain or nonhealing ulcers.  In addition, she has a known left internal carotid artery occlusion. We've been following her right carotid artery closely. When I last saw her on 08/16/2012, she had a less than 40% right internal carotid artery stenosis. She was to come back in one year for a duplex scan which would be August of 2015. She remains asymptomatic. She denies any history of stroke, TIAs, expressive or receptive aphasia, or amaurosis fugax.  Past Medical History  Diagnosis Date  . Atrial fibrillation   . Neuropathy     PERIPHERAL  . Carotid artery occlusion     LEFT  . Scoliosis   . Hoarseness   . Headache(784.0)   . Dizziness   . Abdominal bloating   . Bruises easily   . Pruritus   . Hypercholesterolemia   . Fall at home Sept 2013, Dec. 2013  Jun 08, 2012   Family History  Problem Relation Age of Onset  . Heart disease    . Diabetes Mother   . Heart attack Mother   . Heart disease Mother   . Heart attack Father   . Stroke Father   . Cancer Sister   . Heart disease Maternal Grandmother    SOCIAL HISTORY: History  Substance Use Topics  . Smoking status: Never Smoker   . Smokeless tobacco: Never Used  . Alcohol Use: No   Allergies  Allergen Reactions  . Tape Other (See Comments)   Tears skin.   Current Outpatient Prescriptions  Medication Sig Dispense Refill  . ALPRAZolam (XANAX) 0.25 MG tablet Take 1/2 tablet at bedtime as needed for sleep.  30 tablet  1  . atenolol (TENORMIN) 25 MG tablet TAKE 1 TABLET TWICE A DAY  60 tablet  2  . BIOTIN 5000 PO Take by mouth daily.      . Calcium-Vitamin D-Vitamin K (VIACTIV PO) Take by mouth.      . cetaphil (CETAPHIL) cream Apply topically as needed.      . cetirizine HCl (ZYRTEC) 5 MG/5ML SYRP Take 5 mLs (5 mg total) by mouth daily. Take this medication in the evening.  300 mL  1  . Multiple Vitamin (MULTIVITAMIN) tablet Take 1 tablet by mouth daily.        Marland Kitchen omeprazole (PRILOSEC) 20 MG capsule Take 20 mg by mouth daily.      . simvastatin (ZOCOR) 40 MG tablet Take 0.5 tablets (20 mg total) by mouth daily.  30 tablet  5  . traMADol (ULTRAM) 50 MG tablet Take 1 tablet (50 mg total) by mouth every 6 (six) hours as needed for pain.  15 tablet  0  . warfarin (COUMADIN) 2 MG tablet Take 1 tablet (2 mg total) by mouth as directed.  40 tablet  1  . zoster  vaccine live, PF, (ZOSTAVAX) 19147 UNT/0.65ML injection Inject 19,400 Units into the skin once.  1 each  0   No current facility-administered medications for this visit.   REVIEW OF SYSTEMS: Valu.Nieves ] denotes positive finding; [  ] denotes negative finding  CARDIOVASCULAR:  [ ]  chest pain   [ ]  chest pressure   [ ]  palpitations   [ ]  orthopnea   [ ]  dyspnea on exertion   [ ]  claudication   [ ]  rest pain   [ ]  DVT   [ ]  phlebitis PULMONARY:   [ ]  productive cough   [ ]  asthma   [ ]  wheezing NEUROLOGIC:   [ ]  weakness  [ ]  paresthesias  [ ]  aphasia  [ ]  amaurosis  [ ]  dizziness HEMATOLOGIC:   [ ]  bleeding problems   [ ]  clotting disorders MUSCULOSKELETAL:  [ ]  joint pain   [ ]  joint swelling [ ]  leg swelling GASTROINTESTINAL: [ ]   blood in stool  [ ]   hematemesis GENITOURINARY:  [ ]   dysuria  [ ]   hematuria PSYCHIATRIC:  [ ]  history of major depression INTEGUMENTARY:  Valu.Nieves ] rashes  [ ]   ulcers CONSTITUTIONAL:  [ ]  fever   [ ]  chills  PHYSICAL EXAM: Filed Vitals:   02/21/13 1422  BP: 125/74  Pulse: 65  Height: 5\' 2"  (1.575 m)  Weight: 117 lb (53.071 kg)  SpO2: 100%   Body mass index is 21.39 kg/(m^2). GENERAL: The patient is a well-nourished female, in no acute distress. The vital signs are documented above. CARDIOVASCULAR: There is a regular rate and rhythm. I do not detect carotid bruits. PULMONARY: There is good air exchange bilaterally without wheezing or rales. ABDOMEN: Soft and non-tender with normal pitched bowel sounds.  MUSCULOSKELETAL: There are no major deformities or cyanosis. NEUROLOGIC: No focal weakness or paresthesias are detected. SKIN: she has multiple varicosities bilaterally but especially on the right lower extremity. There is no evidence of active phlebitis. PSYCHIATRIC: The patient has a normal affect.  DATA:  I have independently interpreted her arterial Doppler study which shows biphasic Doppler signals in the right posterior tibial and dorsalis pedis positions. ABI on the right is 100%. On the left side she has a triphasic dorsalis pedis and posterior tibial position. ABI on the left is 100%.  MEDICAL ISSUES:  Occlusion and stenosis of carotid artery without mention of cerebral infarction She remains asymptomatic. She will be due for her follow up carotid duplex scan in August of this year. She knows to call sooner if she has problems.  PVD (peripheral vascular disease) I reassured her that she has excellent arterial flow bilaterally with no evidence of significant peripheral vascular disease.  Varicose veins of lower extremities with other complications She has known chronic venous insufficiency. We have again discussed the importance of intermittent leg elevation. She is unable to get on compression stockings. I would be reluctant to consider a more aggressive approach to her varicose veins given her age.   [facility] [place]: [phone]

## 2013-02-21 NOTE — Assessment & Plan Note (Signed)
I reassured her that she has excellent arterial flow bilaterally with no evidence of significant peripheral vascular disease.

## 2013-02-21 NOTE — Assessment & Plan Note (Signed)
She remains asymptomatic. She will be due for her follow up carotid duplex scan in August of this year. She knows to call sooner if she has problems.

## 2013-02-21 NOTE — Assessment & Plan Note (Signed)
She has known chronic venous insufficiency. We have again discussed the importance of intermittent leg elevation. She is unable to get on compression stockings. I would be reluctant to consider a more aggressive approach to her varicose veins given her age.

## 2013-02-22 ENCOUNTER — Other Ambulatory Visit: Payer: Self-pay

## 2013-02-22 MED ORDER — ATENOLOL 25 MG PO TABS: ORAL_TABLET | ORAL | Status: DC

## 2013-02-26 ENCOUNTER — Inpatient Hospital Stay: Admission: RE | Admit: 2013-02-26 | Payer: Medicare Other | Source: Ambulatory Visit

## 2013-02-28 ENCOUNTER — Other Ambulatory Visit: Payer: Self-pay | Admitting: Vascular Surgery

## 2013-02-28 DIAGNOSIS — I6529 Occlusion and stenosis of unspecified carotid artery: Secondary | ICD-10-CM

## 2013-03-02 ENCOUNTER — Other Ambulatory Visit: Payer: Medicare Other

## 2013-03-05 ENCOUNTER — Ambulatory Visit (INDEPENDENT_AMBULATORY_CARE_PROVIDER_SITE_OTHER): Payer: Medicare Other | Admitting: Radiology

## 2013-03-05 ENCOUNTER — Other Ambulatory Visit: Payer: Medicare Other

## 2013-03-05 DIAGNOSIS — H538 Other visual disturbances: Secondary | ICD-10-CM

## 2013-03-05 NOTE — Procedures (Signed)
    History:  Vanessa [name] is a 80 year old patient with a history of episodes of visual disturbance associated with wavy lines and a "checkerboard pattern". The episodes may last several seconds to up to 1 minute. The patient being evaluated for these events.  This is a routine EEG. No skull defects are noted. Medications include alprazolam, atenolol, calcium, Zyrtec, multivitamins, Prilosec, simvastatin, Coumadin, and Ultram.   EEG classification: Normal awake  Description of the recording: The background rhythms of this recording consists of a fairly well modulated medium amplitude alpha rhythm of 9 Hz that is reactive to eye opening and closure. As the record progresses, the patient appears to remain in the waking state throughout the recording. Photic stimulation was performed, resulting in a bilateral and symmetric photic driving response. Hyperventilation was not performed. At no time during the recording does there appear to be evidence of spike or spike wave discharges or evidence of focal slowing. EKG monitor shows evidence of an irregular heart rhythm, with a heart rate of 72.  Impression: This is a normal EEG recording in the waking state. No evidence of ictal or interictal discharges are seen.

## 2013-03-06 ENCOUNTER — Other Ambulatory Visit: Payer: Medicare Other

## 2013-03-06 NOTE — Progress Notes (Signed)
Quick Note:  Called patient to inform her of normal EEG, patient states that she will get the CT scan done as soon as she can, no further questions or concerns. ______

## 2013-03-09 DIAGNOSIS — D485 Neoplasm of uncertain behavior of skin: Secondary | ICD-10-CM | POA: Diagnosis not present

## 2013-03-09 DIAGNOSIS — L820 Inflamed seborrheic keratosis: Secondary | ICD-10-CM | POA: Diagnosis not present

## 2013-03-09 DIAGNOSIS — Z85828 Personal history of other malignant neoplasm of skin: Secondary | ICD-10-CM | POA: Diagnosis not present

## 2013-03-11 ENCOUNTER — Other Ambulatory Visit: Payer: Self-pay | Admitting: Cardiology

## 2013-03-12 ENCOUNTER — Other Ambulatory Visit: Payer: Medicare Other

## 2013-03-14 ENCOUNTER — Ambulatory Visit
Admission: RE | Admit: 2013-03-14 | Discharge: 2013-03-14 | Disposition: A | Discharge status: Home / Self Care | Payer: Medicare Other | Source: Ambulatory Visit | Attending: Neurology | Admitting: Neurology

## 2013-03-14 DIAGNOSIS — H538 Other visual disturbances: Secondary | ICD-10-CM

## 2013-03-16 ENCOUNTER — Telehealth: Payer: Self-pay | Admitting: Neurology

## 2013-03-16 NOTE — Telephone Encounter (Signed)
Called patient and informed her of normal CT results, no MRI was ordered by Dr. Janann Colonel, patient expressed understanding, per Dr. Hazle Quant notes patient is to f/u with him after testing complete, patient states she will call back to schedule f/u appointment

## 2013-03-16 NOTE — Progress Notes (Signed)
Quick Note:  Called patient to inform her of normal CT (head) scan, per Dr. Hazle Quant notes he wants to see her once testing was complete, patient states that she will call back the first of the week to schedule an appointment. ______

## 2013-03-16 NOTE — Telephone Encounter (Signed)
Pt called to get the results of the MRI she had on 03-14-13.  She asked if Dr. Janann Colonel or his nurse would give her a call sometime this morning with them.  Please contact. Thank you.

## 2013-03-21 DIAGNOSIS — R1013 Epigastric pain: Secondary | ICD-10-CM | POA: Diagnosis not present

## 2013-03-21 DIAGNOSIS — K3189 Other diseases of stomach and duodenum: Secondary | ICD-10-CM | POA: Diagnosis not present

## 2013-03-28 ENCOUNTER — Ambulatory Visit (INDEPENDENT_AMBULATORY_CARE_PROVIDER_SITE_OTHER): Payer: Medicare Other | Admitting: *Deleted

## 2013-03-28 DIAGNOSIS — Z5181 Encounter for therapeutic drug level monitoring: Secondary | ICD-10-CM

## 2013-03-28 DIAGNOSIS — I4891 Unspecified atrial fibrillation: Secondary | ICD-10-CM

## 2013-03-28 LAB — POCT INR: INR: 3.5

## 2013-04-04 DIAGNOSIS — Z961 Presence of intraocular lens: Secondary | ICD-10-CM | POA: Diagnosis not present

## 2013-04-04 DIAGNOSIS — H43819 Vitreous degeneration, unspecified eye: Secondary | ICD-10-CM | POA: Diagnosis not present

## 2013-04-04 DIAGNOSIS — D3130 Benign neoplasm of unspecified choroid: Secondary | ICD-10-CM | POA: Diagnosis not present

## 2013-04-04 DIAGNOSIS — H40059 Ocular hypertension, unspecified eye: Secondary | ICD-10-CM | POA: Diagnosis not present

## 2013-04-11 ENCOUNTER — Ambulatory Visit (INDEPENDENT_AMBULATORY_CARE_PROVIDER_SITE_OTHER): Payer: Medicare Other | Admitting: *Deleted

## 2013-04-11 DIAGNOSIS — I4891 Unspecified atrial fibrillation: Secondary | ICD-10-CM

## 2013-04-11 DIAGNOSIS — Z5181 Encounter for therapeutic drug level monitoring: Secondary | ICD-10-CM | POA: Diagnosis not present

## 2013-04-11 LAB — POCT INR: INR: 2.2

## 2013-04-25 DIAGNOSIS — Z85828 Personal history of other malignant neoplasm of skin: Secondary | ICD-10-CM | POA: Diagnosis not present

## 2013-04-25 DIAGNOSIS — D1801 Hemangioma of skin and subcutaneous tissue: Secondary | ICD-10-CM | POA: Diagnosis not present

## 2013-04-25 DIAGNOSIS — L739 Follicular disorder, unspecified: Secondary | ICD-10-CM | POA: Diagnosis not present

## 2013-04-25 DIAGNOSIS — D239 Other benign neoplasm of skin, unspecified: Secondary | ICD-10-CM | POA: Diagnosis not present

## 2013-04-25 DIAGNOSIS — L299 Pruritus, unspecified: Secondary | ICD-10-CM | POA: Diagnosis not present

## 2013-04-25 DIAGNOSIS — L821 Other seborrheic keratosis: Secondary | ICD-10-CM | POA: Diagnosis not present

## 2013-04-30 ENCOUNTER — Ambulatory Visit (INDEPENDENT_AMBULATORY_CARE_PROVIDER_SITE_OTHER): Payer: Medicare Other | Admitting: Pharmacist

## 2013-04-30 DIAGNOSIS — I4891 Unspecified atrial fibrillation: Secondary | ICD-10-CM

## 2013-04-30 DIAGNOSIS — Z5181 Encounter for therapeutic drug level monitoring: Secondary | ICD-10-CM

## 2013-04-30 LAB — POCT INR: INR: 2.9

## 2013-05-25 DIAGNOSIS — J309 Allergic rhinitis, unspecified: Secondary | ICD-10-CM | POA: Diagnosis not present

## 2013-05-30 ENCOUNTER — Ambulatory Visit (INDEPENDENT_AMBULATORY_CARE_PROVIDER_SITE_OTHER): Payer: Medicare Other | Admitting: Surgery

## 2013-05-30 DIAGNOSIS — Z5181 Encounter for therapeutic drug level monitoring: Secondary | ICD-10-CM | POA: Diagnosis not present

## 2013-05-30 DIAGNOSIS — I4891 Unspecified atrial fibrillation: Secondary | ICD-10-CM | POA: Diagnosis not present

## 2013-05-30 LAB — POCT INR: INR: 2.3

## 2013-06-02 ENCOUNTER — Ambulatory Visit (INDEPENDENT_AMBULATORY_CARE_PROVIDER_SITE_OTHER): Payer: Medicare Other | Admitting: Emergency Medicine

## 2013-06-02 VITALS — BP 132/74 | HR 96 | Temp 98.1°F | Resp 16 | Ht 64.0 in | Wt 114.6 lb

## 2013-06-02 DIAGNOSIS — IMO0002 Reserved for concepts with insufficient information to code with codable children: Secondary | ICD-10-CM

## 2013-06-02 NOTE — Progress Notes (Signed)
[facility] [address] [place] [address] [phone]  Date:  06/02/2013   Name:  Vanessa [name]   DOB:  Sep 16, 1920   MRN:  [id number]  PCP:  Ellsworth Lennox, MD    Chief Complaint: Fall   History of Present Illness:  Vanessa [name] is a 80 y.o. very pleasant female patient who presents with the following:  Patient was visiting her husband's grave and she lost her balance and fell and struck her head on the grass.  She had no LOC and now has no neuro or visual symptoms.  No neck or back pain.  No improvement with over the counter medications or other home remedies. Denies other complaint or health concern today.   Did not pass out and has no chest pain, tightness, pressure in her chest.    Patient Active Problem List   Diagnosis Date Noted  . PVD (peripheral vascular disease) 02/21/2013  . Long term (current) use of anticoagulants 02/05/2013  . Encounter for therapeutic drug monitoring 02/05/2013  . Occlusion and stenosis of carotid artery without mention of cerebral infarction 08/16/2012  . Varicose veins of lower extremities with other complications 33/29/5188  . Malaise and fatigue 10/06/2011  . Chest pain 04/02/2011  . Raynaud phenomenon 06/26/2010  . Osteoarthritis 06/26/2010  . Atrial fibrillation   . Neuropathy   . Hypercholesterolemia   . Scoliosis   . Hoarseness   . Headache   . Dizziness   . Bruises easily   . Pruritus     Past Medical History  Diagnosis Date  . Atrial fibrillation   . Neuropathy     PERIPHERAL  . Carotid artery occlusion     LEFT  . Scoliosis   . Hoarseness   . Headache(784.0)   . Dizziness   . Abdominal bloating   . Bruises easily   . Pruritus   . Hypercholesterolemia   . Fall at home Sept 2013, Dec. 2013  Jun 08, 2012    Past Surgical History  Procedure Laterality Date  . Abdominal hysterectomy    . Spine surgery  1997  . Gallbladder surgery    . Cholecystectomy      Gal Bladder     History  Substance Use Topics  . Smoking status: Never Smoker   . Smokeless tobacco: Never Used  . Alcohol Use: No    Family History  Problem Relation Age of Onset  . Heart disease    . Diabetes Mother   . Heart attack Mother   . Heart disease Mother   . Heart attack Father   . Stroke Father   . Cancer Sister   . Heart disease Maternal Grandmother     Allergies  Allergen Reactions  . Tape Other (See Comments)    Tears skin.    Medication list has been reviewed and updated.  Current Outpatient Prescriptions on File Prior to Visit  Medication Sig Dispense Refill  . ALPRAZolam (XANAX) 0.25 MG tablet Take 1/2 tablet at bedtime as needed for sleep.  30 tablet  1  . atenolol (TENORMIN) 25 MG tablet TAKE 1 TABLET TWICE A DAY  60 tablet  6  . BIOTIN 5000 PO Take by mouth daily.      . Calcium-Vitamin D-Vitamin K (VIACTIV PO) Take by mouth.      . cetaphil (CETAPHIL) cream Apply topically as needed.      . cetirizine HCl (ZYRTEC) 5 MG/5ML SYRP Take 5 mLs (5 mg total) by  mouth daily. Take this medication in the evening.  300 mL  1  . fexofenadine (ALLEGRA) 180 MG tablet Take 90 mg by mouth daily.      Marland Kitchen lactobacillus acidophilus & bulgar (LACTINEX) chewable tablet Chew 1 tablet by mouth 2 (two) times daily.      . Multiple Vitamin (MULTIVITAMIN) tablet Take 1 tablet by mouth daily.        Marland Kitchen omeprazole (PRILOSEC) 20 MG capsule Take 20 mg by mouth daily.      . simvastatin (ZOCOR) 40 MG tablet TAKE 0.5 TABLETS BY MOUTH DAILY.  30 tablet  3  . traMADol (ULTRAM) 50 MG tablet Take 1 tablet (50 mg total) by mouth every 6 (six) hours as needed for pain.  15 tablet  0  . warfarin (COUMADIN) 2 MG tablet Take as directed by coumadin clinic  40 tablet  3  . zoster vaccine live, PF, (ZOSTAVAX) 91478 UNT/0.65ML injection Inject 19,400 Units into the skin once.  1 each  0   No current facility-administered medications on file prior to visit.    Review of Systems:  As per HPI, otherwise  negative.    Physical Examination: Filed Vitals:   06/02/13 1643  BP: 132/74  Pulse: 96  Temp: 98.1 F (36.7 C)  Resp: 16   Filed Vitals:   06/02/13 1643  Height: 5\' 4"  (1.626 m)  Weight: 114 lb 9.6 oz (51.982 kg)   Body mass index is 19.66 kg/(m^2). Ideal Body Weight: Weight in (lb) to have BMI = 25: 145.3  GEN: WDWN, NAD, Non-toxic, A & O x 3 HEENT: abrasion left malar eminence, Normocephalic. Neck supple. No masses, No LAD. Ears and Nose: No external deformity. CV: RRR, No M/G/R. No JVD. No thrill. No extra heart sounds. PULM: CTA B, no wheezes, crackles, rhonchi. No retractions. No resp. distress. No accessory muscle use. ABD: S, NT, ND, +BS. No rebound. No HSM. EXTR: No c/c/e NEURO Normal gait.  PSYCH: Normally interactive. Conversant. Not depressed or anxious appearing.  Calm demeanor.    Assessment and Plan: Abrasion cheek Ice  Signed,  Ellison Carwin, MD

## 2013-06-02 NOTE — Patient Instructions (Signed)
Head Injury, Adult  You have received a head injury. It does not appear serious at this time. Headaches and vomiting are common following head injury. It should be easy to awaken from sleeping. Sometimes it is necessary for you to stay in the emergency department for a while for observation. Sometimes admission to the hospital may be needed. After injuries such as yours, most problems occur within the first 24 hours, but side effects may occur up to 7 10 days after the injury. It is important for you to carefully monitor your condition and contact your health care provider or seek immediate medical care if there is a change in your condition.  WHAT ARE THE TYPES OF HEAD INJURIES?  Head injuries can be as minor as a bump. Some head injuries can be more severe. More severe head injuries include:  · A jarring injury to the brain (concussion).  · A bruise of the brain (contusion). This mean there is bleeding in the brain that can cause swelling.  · A cracked skull (skull fracture).  · Bleeding in the brain that collects, clots, and forms a bump (hematoma).  WHAT CAUSES A HEAD INJURY?  A serious head injury is most likely to happen to someone who is in a car wreck and is not wearing a seat belt. Other causes of major head injuries include bicycle or motorcycle accidents, sports injuries, and falls.  HOW ARE HEAD INJURIES DIAGNOSED?  A complete history of the event leading to the injury and your current symptoms will be helpful in diagnosing head injuries. Many times, pictures of the brain, such as CT or MRI are needed to see the extent of the injury. Often, an overnight hospital stay is necessary for observation.   WHEN SHOULD I SEEK IMMEDIATE MEDICAL CARE?   You should get help right away if:  · You have confusion or drowsiness.  · You feel sick to your stomach (nauseous) or have continued, forceful vomiting.  · You have dizziness or unsteadiness that is getting worse.  · You have severe, continued headaches not  relieved by medicine. Only take over-the-counter or prescription medicines for pain, fever, or discomfort as directed by your health care provider.  · You do not have normal function of the arms or legs or are unable to walk.  · You notice changes in the black spots in the center of the colored part of your eye (pupil).  · You have a clear or bloody fluid coming from your nose or ears.  · You have a loss of vision.  During the next 24 hours after the injury, you must stay with someone who can watch you for the warning signs. This person should contact local emergency services (911 in the [place] if you have seizures, you become unconscious, or you are unable to wake up.  HOW CAN I PREVENT A HEAD INJURY IN THE FUTURE?  The most important factor for preventing major head injuries is avoiding motor vehicle accidents.  To minimize the potential for damage to your head, it is crucial to wear seat belts while riding in motor vehicles. Wearing helmets while bike riding and playing collision sports (like football) is also helpful. Also, avoiding dangerous activities around the house will further help reduce your risk of head injury.   WHEN CAN I RETURN TO NORMAL ACTIVITIES AND ATHLETICS?  You should be reevaluated by your health care provider before returning to these activities. If you have any of the following symptoms, you should not return   to activities or contact sports until 1 week after the symptoms have stopped:  · Persistent headache.  · Dizziness or vertigo.  · Poor attention and concentration.  · Confusion.  · Memory problems.  · Nausea or vomiting.  · Fatigue or tire easily.  · Irritability.  · Intolerant of bright lights or loud noises.  · Anxiety or depression.  · Disturbed sleep.  MAKE SURE YOU:   · Understand these instructions.  · Will watch your condition.  · Will get help right away if you are not doing well or get worse.  Document Released: 12/28/2004 Document Revised: 10/18/2012 Document Reviewed:  09/04/2012  [employer]® Patient Information ©2014 [employer].

## 2013-06-04 ENCOUNTER — Ambulatory Visit: Payer: Medicare Other

## 2013-06-05 DIAGNOSIS — J322 Chronic ethmoidal sinusitis: Secondary | ICD-10-CM | POA: Diagnosis not present

## 2013-06-08 ENCOUNTER — Ambulatory Visit
Admission: RE | Admit: 2013-06-08 | Discharge: 2013-06-08 | Disposition: A | Discharge status: Home / Self Care | Payer: Medicare Other | Source: Ambulatory Visit | Attending: Emergency Medicine | Admitting: Emergency Medicine

## 2013-06-08 ENCOUNTER — Telehealth: Payer: Self-pay

## 2013-06-08 DIAGNOSIS — R51 Headache: Secondary | ICD-10-CM | POA: Diagnosis not present

## 2013-06-08 DIAGNOSIS — S0990XA Unspecified injury of head, initial encounter: Secondary | ICD-10-CM | POA: Diagnosis not present

## 2013-06-08 NOTE — Telephone Encounter (Signed)
Error/ pt decided to come in

## 2013-06-27 ENCOUNTER — Ambulatory Visit (INDEPENDENT_AMBULATORY_CARE_PROVIDER_SITE_OTHER): Payer: Medicare Other | Admitting: *Deleted

## 2013-06-27 DIAGNOSIS — Z5181 Encounter for therapeutic drug level monitoring: Secondary | ICD-10-CM | POA: Diagnosis not present

## 2013-06-27 DIAGNOSIS — I4891 Unspecified atrial fibrillation: Secondary | ICD-10-CM | POA: Diagnosis not present

## 2013-06-27 LAB — POCT INR: INR: 2.2

## 2013-06-29 ENCOUNTER — Ambulatory Visit: Payer: Medicare Other

## 2013-06-29 DIAGNOSIS — I6529 Occlusion and stenosis of unspecified carotid artery: Secondary | ICD-10-CM

## 2013-07-05 ENCOUNTER — Telehealth: Payer: Self-pay

## 2013-07-05 DIAGNOSIS — I83893 Varicose veins of bilateral lower extremities with other complications: Secondary | ICD-10-CM

## 2013-07-05 NOTE — Telephone Encounter (Signed)
Pt. called to report episode of increased "sensitivity" in area of varicose veins protruding (L) inner ankle.  Denies any open sores.  Reports has "some" (L) ankle swelling that "goes down at night."  Stated she elevates her legs, but has not been able to apply compression hose, due to difficulty of putting them on.  Requesting to have someone look at the varicose veins today.  Advised that this would not be considered an emergent situation, that would be necessary to work-in today.  Encouraged to wrap her (L) leg with an Ace Bandage, from the left foot to just below the knee, elevate her legs above the level of the heart, and to apply ice to the tender area.  Pt. is not able to take Ibuprofen, due to being on a blood thinner.   Advised will schedule a future appt., with Dr. Scot Dock to check her varicose veins.  Verb. Understanding. Agrees w/ plan.

## 2013-07-07 DIAGNOSIS — M5090 Cervical disc disorder, unspecified, unspecified cervical region: Secondary | ICD-10-CM | POA: Diagnosis not present

## 2013-07-09 ENCOUNTER — Other Ambulatory Visit (INDEPENDENT_AMBULATORY_CARE_PROVIDER_SITE_OTHER): Payer: Medicare Other

## 2013-07-09 DIAGNOSIS — R5383 Other fatigue: Secondary | ICD-10-CM

## 2013-07-09 DIAGNOSIS — R5381 Other malaise: Secondary | ICD-10-CM

## 2013-07-09 DIAGNOSIS — E7800 Pure hypercholesterolemia, unspecified: Secondary | ICD-10-CM

## 2013-07-09 LAB — HEPATIC FUNCTION PANEL
ALT: 14 U/L (ref 0–35)
AST: 28 U/L (ref 0–37)
Albumin: 3.9 g/dL (ref 3.5–5.2)
Alkaline Phosphatase: 92 U/L (ref 39–117)
Bilirubin, Direct: 0.1 mg/dL (ref 0.0–0.3)
Total Bilirubin: 0.9 mg/dL (ref 0.2–1.2)
Total Protein: 7.5 g/dL (ref 6.0–8.3)

## 2013-07-09 LAB — BASIC METABOLIC PANEL
BUN: 18 mg/dL (ref 6–23)
CO2: 27 mEq/L (ref 19–32)
Calcium: 9.8 mg/dL (ref 8.4–10.5)
Chloride: 103 mEq/L (ref 96–112)
Creatinine, Ser: 0.9 mg/dL (ref 0.4–1.2)
GFR: 63.96 mL/min (ref >60.00)
Glucose, Bld: 97 mg/dL (ref 70–99)
Potassium: 4.4 mEq/L (ref 3.5–5.1)
Sodium: 140 mEq/L (ref 135–145)

## 2013-07-09 LAB — CBC WITH DIFFERENTIAL/PLATELET
Basophils Absolute: 0 10*3/uL (ref 0.0–0.1)
Basophils Relative: 0.3 % (ref 0.0–3.0)
Eosinophils Absolute: 0.1 10*3/uL (ref 0.0–0.7)
Eosinophils Relative: 1.1 % (ref 0.0–5.0)
HCT: 39.8 % (ref 36.0–46.0)
Hemoglobin: 13.2 g/dL (ref 12.0–15.0)
Lymphocytes Relative: 18.3 % (ref 12.0–46.0)
Lymphs Abs: 1.3 10*3/uL (ref 0.7–4.0)
MCHC: 33.1 g/dL (ref 30.0–36.0)
MCV: 95.8 fl (ref 78.0–100.0)
Monocytes Absolute: 0.9 10*3/uL (ref 0.1–1.0)
Monocytes Relative: 12.6 % — ABNORMAL HIGH (ref 3.0–12.0)
Neutro Abs: 4.9 10*3/uL (ref 1.4–7.7)
Neutrophils Relative %: 67.7 % (ref 43.0–77.0)
Platelets: 196 10*3/uL (ref 150.0–400.0)
RBC: 4.16 Mil/uL (ref 3.87–5.11)
RDW: 14.6 % (ref 11.5–15.5)
WBC: 7.2 10*3/uL (ref 4.0–10.5)

## 2013-07-09 LAB — LIPID PANEL
Cholesterol: 197 mg/dL (ref 0–200)
HDL: 77.2 mg/dL (ref >39.00)
LDL Cholesterol: 95 mg/dL (ref 0–99)
NonHDL: 119.8
Total CHOL/HDL Ratio: 3
Triglycerides: 126 mg/dL (ref 0.0–149.0)
VLDL: 25.2 mg/dL (ref 0.0–40.0)

## 2013-07-09 NOTE — Progress Notes (Signed)
Quick Note:  Please make copy of labs for patient visit. ______ 

## 2013-07-11 ENCOUNTER — Encounter: Payer: Self-pay | Admitting: Cardiology

## 2013-07-11 ENCOUNTER — Other Ambulatory Visit: Payer: Self-pay | Admitting: *Deleted

## 2013-07-11 ENCOUNTER — Ambulatory Visit (INDEPENDENT_AMBULATORY_CARE_PROVIDER_SITE_OTHER): Payer: Medicare Other | Admitting: Cardiology

## 2013-07-11 VITALS — BP 133/68 | HR 52 | Ht 62.5 in | Wt 115.0 lb

## 2013-07-11 DIAGNOSIS — I482 Chronic atrial fibrillation, unspecified: Secondary | ICD-10-CM

## 2013-07-11 DIAGNOSIS — I4891 Unspecified atrial fibrillation: Secondary | ICD-10-CM

## 2013-07-11 DIAGNOSIS — E7800 Pure hypercholesterolemia, unspecified: Secondary | ICD-10-CM | POA: Diagnosis not present

## 2013-07-11 DIAGNOSIS — I6529 Occlusion and stenosis of unspecified carotid artery: Secondary | ICD-10-CM

## 2013-07-11 DIAGNOSIS — I6522 Occlusion and stenosis of left carotid artery: Secondary | ICD-10-CM

## 2013-07-11 DIAGNOSIS — I83893 Varicose veins of bilateral lower extremities with other complications: Secondary | ICD-10-CM

## 2013-07-11 NOTE — Assessment & Plan Note (Signed)
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  She is not having any new neurologic symptoms.

## 2013-07-11 NOTE — Progress Notes (Signed)
Vanessa [name] Date of Birth:  05/30/1920 [facility] [address] [place], [place]  [address] [phone]        Fax   [phone]   History of Present Illness: This pleasant 80 year old widowed woman is seen for a scheduled four-month followup office visit. She has a history of atypical chest pain. She was seen in March 2013 with these symptoms and underwent a nuclear stress test subsequently which showed no ischemia. The patient reports that her symptoms have improved. She recently saw Dr. Cristina Gong who placed her on Nexium for dysphagia. She reports improvement in those symptoms since starting on Nexium. She has had a history of frequent falls. She has a cane but sometimes forget to lose a cane and she does notice that her balance is not as good as it used to be. She is on long-term Coumadin because of her chronic atrial flutter fibrillation. She has a history of vascular disease with known chronic occlusion of her left carotid artery. She has a past history of high blood pressure.  Her CHADSSVASC score is 5 (for age 26, female sex, high blood pressure, and vascular disease with occluded left internal carotid artery). Since last visit she has been having a lot of problems with her varicose veins in with her peripheral neuropathy which frequently wakes her up at night. She has also been having some problems with her teeth and is going to have to have some additional dental work. Since we last saw her she has not had any more severe falls. She is followed by Dr. Gladstone Lighter for cervical arthritis, by Dr. Gae Gallop and by Dr. Si Gaul for her GI issues.  She uses,[facility] for her PCP.   Current Outpatient Prescriptions  Medication Sig Dispense Refill  . ALPRAZolam (XANAX) 0.25 MG tablet Take 1/2 tablet at bedtime as needed for sleep.  30 tablet  1  . atenolol (TENORMIN) 25 MG tablet TAKE 1 TABLET TWICE A DAY  60 tablet  6  . BIOTIN 5000 PO Take by mouth daily.       . Calcium-Vitamin D-Vitamin K (VIACTIV PO) Take by mouth.      . cetaphil (CETAPHIL) cream Apply topically as needed.      . Multiple Vitamin (MULTIVITAMIN) tablet Take 1 tablet by mouth daily.        Marland Kitchen omeprazole (PRILOSEC) 20 MG capsule Take 20 mg by mouth daily.      . simvastatin (ZOCOR) 40 MG tablet TAKE 0.5 TABLETS BY MOUTH DAILY.  30 tablet  3  . warfarin (COUMADIN) 2 MG tablet Take as directed by coumadin clinic  40 tablet  3  . zoster vaccine live, PF, (ZOSTAVAX) 42683 UNT/0.65ML injection Inject 19,400 Units into the skin once.  1 each  0   No current facility-administered medications for this visit.    Allergies  Allergen Reactions  . Tape Other (See Comments)    Tears skin.    Patient Active Problem List   Diagnosis Date Noted  . Raynaud phenomenon 06/26/2010    Priority: High  . Osteoarthritis 06/26/2010    Priority: High  . Neuropathy     Priority: High  . PVD (peripheral vascular disease) 02/21/2013  . Long term (current) use of anticoagulants 02/05/2013  . Encounter for therapeutic drug monitoring 02/05/2013  . Occlusion and stenosis of carotid artery without mention of cerebral infarction 08/16/2012  . Varicose veins of lower extremities with other complications 41/96/2229  . Malaise  and fatigue 10/06/2011  . Chest pain 04/02/2011  . Atrial fibrillation   . Hypercholesterolemia   . Scoliosis   . Hoarseness   . Headache   . Dizziness   . Bruises easily   . Pruritus     History  Smoking status  . Never Smoker   Smokeless tobacco  . Never Used    History  Alcohol Use No    Family History  Problem Relation Age of Onset  . Heart disease    . Diabetes Mother   . Heart attack Mother   . Heart disease Mother   . Heart attack Father   . Stroke Father   . Cancer Sister   . Heart disease Maternal Grandmother     Review of Systems: Constitutional: no fever chills diaphoresis or fatigue or change in weight.  Head and neck: no hearing loss, no  epistaxis, no photophobia or visual disturbance. Respiratory: No cough, shortness of breath or wheezing. Cardiovascular: No chest pain peripheral edema, palpitations. Gastrointestinal: No abdominal distention, no abdominal pain, no change in bowel habits hematochezia or melena. Genitourinary: No dysuria, no frequency, no urgency, no nocturia. Musculoskeletal:No arthralgias, no back pain, no gait disturbance or myalgias. Neurological: No dizziness, no headaches, no numbness, no seizures, no syncope, no weakness, no tremors. Hematologic: No lymphadenopathy, no easy bruising. Psychiatric: No confusion, no hallucinations, no sleep disturbance.    Physical Exam: Filed Vitals:   07/11/13 1022  BP: 133/68  Pulse: 52   the general appearance reveals an alert elderly woman in no acute distress.The head and neck exam reveals pupils equal and reactive.  Extraocular movements are full.  There is no scleral icterus.  The mouth and pharynx are normal.  The neck is supple.  The carotids reveal no bruits.  The jugular venous pressure is normal.  The  thyroid is not enlarged.  There is no lymphadenopathy.  The chest is clear to percussion and auscultation.  There are no rales or rhonchi.  Expansion of the chest is symmetrical.  The precordium is quiet.  The pulse is irregularly irregular  The first heart sound is normal.  The second heart sound is physiologically split.  There is soft basilar systolic murmur. There is no abnormal lift or heave.  The abdomen is soft and nontender.  The bowel sounds are normal.  The liver and spleen are not enlarged.  There are no abdominal masses.  There are no abdominal bruits.  Extremities reveal good pedal pulses.  There is no phlebitis or edema.  She does have significant varicose veins on both lower extremities. There is no cyanosis or clubbing.  Strength is normal and symmetrical in all extremities.  There is no lateralizing weakness.  There are no sensory deficits.  The skin  is warm and dry.  There is no rash.    Assessment / Plan: 1.  Prominent atrial fibrillation.  Chadsvasc score is 5 2. known left carotid artery occlusion. 3. Hypercholesterolemia. 4.  Essential hypertension without heart failure 5.  Osteoarthritis  Continue same medication.  She mentioned that she will be moving soon  to friend's home [place] to an apartment there. Recheck here in 4 months for office visit lipid panel hepatic function panel and basal metabolic panel.

## 2013-07-11 NOTE — Assessment & Plan Note (Signed)
She remains on long-term Coumadin for her atrial fibrillation.  She has not been having any TIA or stroke symptoms.  She has not had any signs of GI or GU bleeding from her anticoagulation.

## 2013-07-11 NOTE — Patient Instructions (Signed)
Your physician recommends that you continue on your current medications as directed. Please refer to the Current Medication list given to you today.  Your physician wants you to follow-up in: 4 months with fasting labs (lp/bmet/hfp) You will receive a reminder letter in the mail two months in advance. If you don't receive a letter, please call our office to schedule the follow-up appointment.  

## 2013-07-11 NOTE — Assessment & Plan Note (Signed)
The patient has a history of hypercholesterolemia.  She is on simvastatin.  Lab work is satisfactory and we reviewed that with her today.  She is not having any side effects from the simvastatin in terms of myalgias.

## 2013-07-19 ENCOUNTER — Encounter: Payer: Self-pay | Admitting: Cardiology

## 2013-07-24 DIAGNOSIS — K219 Gastro-esophageal reflux disease without esophagitis: Secondary | ICD-10-CM | POA: Diagnosis not present

## 2013-07-25 ENCOUNTER — Encounter: Payer: Self-pay | Admitting: Vascular Surgery

## 2013-07-26 ENCOUNTER — Ambulatory Visit (INDEPENDENT_AMBULATORY_CARE_PROVIDER_SITE_OTHER): Payer: Medicare Other | Admitting: Vascular Surgery

## 2013-07-26 ENCOUNTER — Encounter: Payer: Self-pay | Admitting: Vascular Surgery

## 2013-07-26 ENCOUNTER — Ambulatory Visit (HOSPITAL_COMMUNITY)
Admission: RE | Admit: 2013-07-26 | Discharge: 2013-07-26 | Disposition: A | Discharge status: Home / Self Care | Payer: Medicare Other | Source: Ambulatory Visit | Attending: Vascular Surgery | Admitting: Vascular Surgery

## 2013-07-26 VITALS — BP 137/52 | HR 71 | Resp 18 | Ht 62.5 in | Wt 115.0 lb

## 2013-07-26 DIAGNOSIS — I739 Peripheral vascular disease, unspecified: Secondary | ICD-10-CM | POA: Diagnosis not present

## 2013-07-26 DIAGNOSIS — I83893 Varicose veins of bilateral lower extremities with other complications: Secondary | ICD-10-CM

## 2013-07-26 NOTE — Progress Notes (Signed)
[facility]  Patient name: [name] MRN: [id number] DOB: 10-28-1920 Sex: female  REASON FOR VISIT: Follow up of varicose veins  HPI: Vanessa [name] is a 80 y.o. female who I last saw on 02/21/2013 with pain in her legs. At that time, she describes pain in her right hip which sounded like arthritic pain. She described aching pain in her legs likely consistent with chronic venous insufficiency. At that time we discussed the importance of intermittent leg elevation. She's not able to wear compression stockings. I did not want to consider a more aggressive approach to her varicose veins at that time.  In addition, she was being followed with asymptomatic carotid disease. She has a known left internal carotid artery occlusion and we have been continuing to follow the right side closely which has a less than 40% stenosis. Her next carotid duplex scan was due in August of 2015.  She has been a little bit anxious recently had some mild chest tightness which resolved quickly.  Past Medical History  Diagnosis Date  . Atrial fibrillation   . Neuropathy     PERIPHERAL  . Carotid artery occlusion     LEFT  . Scoliosis   . Hoarseness   . Headache(784.0)   . Dizziness   . Abdominal bloating   . Bruises easily   . Pruritus   . Hypercholesterolemia   . Fall at home Sept 2013, Dec. 2013  Jun 08, 2012  . Varicose veins    Family History  Problem Relation Age of Onset  . Heart disease    . Diabetes Mother   . Heart attack Mother   . Heart disease Mother   . Heart attack Father   . Stroke Father   . Cancer Sister   . Heart disease Maternal Grandmother    SOCIAL HISTORY: History  Substance Use Topics  . Smoking status: Never Smoker   . Smokeless tobacco: Never Used  . Alcohol Use: No   Allergies  Allergen Reactions  . Tape Other (See Comments)    Tears skin.   Current Outpatient Prescriptions  Medication Sig Dispense Refill  . ALPRAZolam  (XANAX) 0.25 MG tablet Take 1/2 tablet at bedtime as needed for sleep.  30 tablet  1  . atenolol (TENORMIN) 25 MG tablet TAKE 1 TABLET TWICE A DAY  60 tablet  6  . BIOTIN 5000 PO Take by mouth daily.      . Calcium-Vitamin D-Vitamin K (VIACTIV PO) Take by mouth.      . cetaphil (CETAPHIL) cream Apply topically as needed.      . Multiple Vitamin (MULTIVITAMIN) tablet Take 1 tablet by mouth daily.        Marland Kitchen omeprazole (PRILOSEC) 20 MG capsule Take 20 mg by mouth daily.      . simvastatin (ZOCOR) 40 MG tablet TAKE 0.5 TABLETS BY MOUTH DAILY.  30 tablet  3  . warfarin (COUMADIN) 2 MG tablet Take as directed by coumadin clinic  40 tablet  3  . zoster vaccine live, PF, (ZOSTAVAX) 43154 UNT/0.65ML injection Inject 19,400 Units into the skin once.  1 each  0   No current facility-administered medications for this visit.   REVIEW OF SYSTEMS: Valu.Nieves ] denotes positive finding; [  ] denotes negative finding  CARDIOVASCULAR:  [ ]  chest pain   [ ]  chest pressure   [ ]  palpitations   [ ]  orthopnea   [ ]  dyspnea on exertion   [ ]   claudication   [ ]  rest pain   [ ]  DVT   [ ]  phlebitis PULMONARY:   [ ]  productive cough   [ ]  asthma   [ ]  wheezing NEUROLOGIC:   [ ]  weakness  [ ]  paresthesias  [ ]  aphasia  [ ]  amaurosis  [ ]  dizziness HEMATOLOGIC:   [ ]  bleeding problems   [ ]  clotting disorders MUSCULOSKELETAL:  [ ]  joint pain   [ ]  joint swelling [ ]  leg swelling GASTROINTESTINAL: [ ]   blood in stool  [ ]   hematemesis GENITOURINARY:  [ ]   dysuria  [ ]   hematuria PSYCHIATRIC:  [ ]  history of major depression INTEGUMENTARY:  [ ]  rashes  [ ]  ulcers CONSTITUTIONAL:  [ ]  fever   [ ]  chills  PHYSICAL EXAM: Filed Vitals:   07/26/13 1608  BP: 137/52  Pulse: 71  Resp: 18  Height: 5' 2.5" (1.588 m)  Weight: 115 lb (52.164 kg)   Body mass index is 20.69 kg/(m^2). GENERAL: The patient is a well-nourished female, in no acute distress. The vital signs are documented above. CARDIOVASCULAR: There is a regular rate  and rhythm. She has a left carotid bruit. PULMONARY: There is good air exchange bilaterally without wheezing or rales. ABDOMEN: Soft and non-tender with normal pitched bowel sounds.  MUSCULOSKELETAL: There are no major deformities or cyanosis. NEUROLOGIC: No focal weakness or paresthesias are detected. SKIN: There are no ulcers or rashes noted. She has significant varicose veins bilaterally. PSYCHIATRIC: The patient has a normal affect.  DATA:  I have independently interpreted her venous duplex scan of the left lower extremity which shows no evidence of DVT and no deep vein reflux on the left. 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 There is a segment of reflux in the greater saphenous vein around the knee and also a segment of the small saphenous vein with reflux.  MEDICAL ISSUES: With respect to her venous disease, we have again discussed the importance of intermittent leg elevation. She did find some stockings with a zipper and she was given a try wearing these which I think is reasonable. He's had only a mild gradient and so should be easier to get on.  She is due for a one year carotid duplex scan in August and assuming that there has been no significant change at that time will continue with yearly follow up studies of the right carotid artery given that she has a known left carotid occlusion.  I have encouraged her to follow up with Dr. Carmel Sacramento if she ever has any chest pain.  I plan on seeing her back in one year. She knows to call sooner if she has problems.   [facility]: [phone]

## 2013-07-29 ENCOUNTER — Other Ambulatory Visit: Payer: Self-pay | Admitting: Cardiology

## 2013-07-30 ENCOUNTER — Telehealth: Payer: Self-pay | Admitting: Cardiology

## 2013-07-30 NOTE — Telephone Encounter (Signed)
New message     Patient is on warfarin-----can she take tylenol extra strength for a headache?

## 2013-07-30 NOTE — Telephone Encounter (Signed)
Patient advised that it would be okay to take extra strength tylenol 2-3 times daily for pain.

## 2013-08-01 ENCOUNTER — Encounter: Payer: Self-pay | Admitting: Cardiology

## 2013-08-01 DIAGNOSIS — M5090 Cervical disc disorder, unspecified, unspecified cervical region: Secondary | ICD-10-CM | POA: Diagnosis not present

## 2013-08-08 ENCOUNTER — Ambulatory Visit (INDEPENDENT_AMBULATORY_CARE_PROVIDER_SITE_OTHER): Payer: Medicare Other | Admitting: Surgery

## 2013-08-08 DIAGNOSIS — Z5181 Encounter for therapeutic drug level monitoring: Secondary | ICD-10-CM

## 2013-08-08 DIAGNOSIS — I4891 Unspecified atrial fibrillation: Secondary | ICD-10-CM | POA: Diagnosis not present

## 2013-08-08 DIAGNOSIS — M542 Cervicalgia: Secondary | ICD-10-CM | POA: Diagnosis not present

## 2013-08-08 LAB — POCT INR: INR: 2.6

## 2013-08-11 DIAGNOSIS — M5090 Cervical disc disorder, unspecified, unspecified cervical region: Secondary | ICD-10-CM | POA: Diagnosis not present

## 2013-08-21 ENCOUNTER — Ambulatory Visit (INDEPENDENT_AMBULATORY_CARE_PROVIDER_SITE_OTHER): Payer: Medicare Other | Admitting: Family Medicine

## 2013-08-21 ENCOUNTER — Encounter: Payer: Self-pay | Admitting: Family Medicine

## 2013-08-21 ENCOUNTER — Encounter: Payer: Self-pay | Admitting: Family

## 2013-08-21 VITALS — BP 115/56 | HR 80 | Temp 98.2°F | Resp 16 | Ht 61.0 in | Wt 115.4 lb

## 2013-08-21 DIAGNOSIS — L819 Disorder of pigmentation, unspecified: Secondary | ICD-10-CM | POA: Diagnosis not present

## 2013-08-21 DIAGNOSIS — Z111 Encounter for screening for respiratory tuberculosis: Secondary | ICD-10-CM | POA: Diagnosis not present

## 2013-08-21 DIAGNOSIS — Z23 Encounter for immunization: Secondary | ICD-10-CM

## 2013-08-21 DIAGNOSIS — Z0000 Encounter for general adult medical examination without abnormal findings: Secondary | ICD-10-CM

## 2013-08-21 NOTE — Progress Notes (Signed)
   Subjective:    Patient ID: Vanessa [name], female    DOB: 1920/04/29, 80 y.o.   MRN: [id number]  HPI    Review of Systems     Objective:   Physical Exam        Assessment & Plan:   Tuberculosis Risk Questionnaire  1. no Were you born outside the [place] in one of the following parts of the world: [place], [place], [place], [place] or [place]?    2. No Have you traveled outside the [place] and lived for more than one month in one of the following parts of the world: [place], [place], [place], [place] or [place]?    3. No Do you have a compromised immune system such as from any of the following conditions:HIV/AIDS, organ or bone marrow transplantation, diabetes, immunosuppressive medicines (e.g. Prednisone, Remicaide), leukemia, lymphoma, cancer of the head or neck, gastrectomy or jejunal bypass, end-stage renal disease (on dialysis), or silicosis?     4. No Have you ever or do you plan on working in: a residential care center, a health care facility, a jail or prison or homeless shelter?    5. No Have you ever: injected illegal drugs, used crack cocaine, lived in a homeless shelter  or been in jail or prison?     6. No Have you ever been exposed to anyone with infectious tuberculosis?    Tuberculosis Symptom Questionnaire  Do you currently have any of the following symptoms?  1. No Unexplained cough lasting more than 3 weeks?   2. No Unexplained fever lasting more than 3 weeks.   3. No Night Sweats (sweating that leaves the bedclothes and sheets wet)     4.no} Shortness of Breath   5. No Chest Pain   6. No Unintentional weight loss    7. No Unexplained fatigue (very tired for no reason)

## 2013-08-21 NOTE — Patient Instructions (Signed)
For lip lesions- Try BLISTEX which you can get at [facility]; it is usually near the check out and comes in a little pink pot. Wash your hands thoroughly before applying this product to your lips.  I have discussed with you the fact that your symptoms of itching and multiple skin lesions may be an indication of some underlying disease process. The diagnosis and treatment may be more probative and costly than coping with these symptoms. You agree that you do not want a lot of diagnostic testing going forward.   I will see you in October prior to administration of the Flu vaccine this fall. We will try to have all documentation completed in order for you to transition into Assisted Living as smoothly as possible.

## 2013-08-21 NOTE — Progress Notes (Signed)
Subjective:    Patient ID: Vanessa [name], female    DOB: Mar 05, 1920, 80 y.o.   MRN: [id number]  HPI  This 80 y.o. 9 female is here for [facility] Subsequent CPE; she recently had OV with Dr. Mare Ferrari for Atrial Fib follow-up and complete cardiac assessment and labs. Pt is compliant w/ all medications. Her main concern is about chronic skin lesions and condition of her skin; DERM has not satisfactorily relieved her itching or agreed to remove large "unsightly" seb Ks. She had temporary relief of itching w/ topical steroid and TriCalm.   Pt has "specialist for every body part" and is scheduled for visit w/ Vascular Physician later this week.  Pt is planning to move into a retirement community later this year. She reports paperwork has been faxed to our facility twice for completion (I have not seen any forms from [facility]). Pt requests PPD today and any necessary vaccines.  Patient Active Problem List   Diagnosis Date Noted  . PVD (peripheral vascular disease) 02/21/2013  . Long term (current) use of anticoagulants 02/05/2013  . Encounter for therapeutic drug monitoring 02/05/2013  . Occlusion and stenosis of carotid artery without mention of cerebral infarction 08/16/2012  . Varicose veins of lower extremities with other complications 15/17/6160  . Malaise and fatigue 10/06/2011  . Chest pain 04/02/2011  . Raynaud phenomenon 06/26/2010  . Osteoarthritis 06/26/2010  . Atrial fibrillation   . Neuropathy   . Hypercholesterolemia   . Scoliosis   . Hoarseness   . Headache   . Dizziness   . Bruises easily   . Pruritus     Prior to Admission medications   Medication Sig Start Date End Date Taking? Authorizing Provider  ALPRAZolam Duanne Moron) 0.25 MG tablet Take 1/2 tablet at bedtime as needed for sleep. 07/19/12  Yes Barton Fanny, MD  atenolol (TENORMIN) 25 MG tablet TAKE 1 TABLET TWICE A DAY 02/22/13  Yes [name], MD  BIOTIN 5000 PO Take by mouth daily.   Yes Historical  Provider, MD  Calcium-Vitamin D-Vitamin K (VIACTIV PO) Take by mouth.   Yes Historical Provider, MD  cetaphil (CETAPHIL) cream Apply topically as needed.   Yes Historical Provider, MD  Multiple Vitamin (MULTIVITAMIN) tablet Take 1 tablet by mouth daily.     Yes Historical Provider, MD  simvastatin (ZOCOR) 40 MG tablet TAKE 0.5 TABLETS BY MOUTH DAILY.   Yes [name], MD  warfarin (COUMADIN) 2 MG tablet TAKE AS DIRECTED BY COUMADIN CLINIC   Yes [name], MD  zoster vaccine live, PF, (ZOSTAVAX) 73710 UNT/0.65ML injection Inject 19,400 Units into the skin once. 10/13/12  Yes Barton Fanny, MD  omeprazole (PRILOSEC) 20 MG capsule Take 20 mg by mouth daily.    Historical Provider, MD    Allergies  Allergen Reactions  . Tape Other (See Comments)    Tears skin.    Past Surgical History  Procedure Laterality Date  . Abdominal hysterectomy    . Spine surgery  1997  . Gallbladder surgery    . Cholecystectomy      Gal Bladder    History   Social History  . Marital Status: Widowed    Spouse Name: N/A    Number of Children: 2  . Years of Education: 12+   Occupational History  . Not on file.   Social History Main Topics  . Smoking status: Never Smoker   . Smokeless tobacco: Never Used  . Alcohol Use: No  . Drug Use:  No  . Sexual Activity: No   Other Topics Concern  . Not on file   Social History Narrative   Patient lives at home alone.    Patient is a widowed.    Patient is retired.    Patient has no children but adopted twin sons.    Patient has a college degree.     Family History  Problem Relation Age of Onset  . Heart disease    . Diabetes Mother   . Heart attack Mother   . Heart disease Mother   . Heart attack Father   . Stroke Father   . Cancer Sister   . Heart disease Maternal Grandmother     Review of Systems  Constitutional: Negative.   HENT: Positive for dental problem, hearing loss and postnasal drip.   Eyes: Negative.     Respiratory: Negative.   Cardiovascular: Positive for palpitations and leg swelling.       Has ankle edema and chronic A.Fib.  Endocrine: Negative.   Genitourinary: Positive for frequency.  Musculoskeletal: Positive for arthralgias, back pain and neck pain.       Has C- spine DDD and mild T-Spine scoliosis.  Skin: Positive for color change.  Allergic/Immunologic: Negative.   Neurological: Positive for numbness and headaches.  Hematological: Bruises/bleeds easily.       Objective:   Physical Exam  Nursing note and vitals reviewed. Constitutional: She is oriented to person, place, and time. Vital signs are normal. She appears well-developed and well-nourished. No distress.  HENT:  Head: Normocephalic and atraumatic.  Right Ear: Hearing, tympanic membrane and external ear normal.  Left Ear: Hearing, tympanic membrane, external ear and ear canal normal.  Nose: Nose normal. No nasal deformity or septal deviation.  Mouth/Throat: Uvula is midline, oropharynx is clear and moist and mucous membranes are normal. No oral lesions. Normal dentition.  Eyes: Conjunctivae, EOM and lids are normal. Pupils are equal, round, and reactive to light. No scleral icterus.  Neck: Trachea normal. Neck supple. No spinous process tenderness and no muscular tenderness present. Decreased range of motion present. No mass and no thyromegaly present.  Cardiovascular: Normal rate, S1 normal, S2 normal, normal heart sounds and normal pulses.  An irregularly irregular rhythm present.  No extrasystoles are present. PMI is not displaced.  Exam reveals no friction rub.   No murmur heard. Pulmonary/Chest: Effort normal and breath sounds normal. No respiratory distress. She has no decreased breath sounds. She has no wheezes. She has no rales.  Abdominal: Soft. Normal appearance and bowel sounds are normal. She exhibits no distension, no pulsatile midline mass and no mass. There is no hepatosplenomegaly. There is no  tenderness. There is no guarding and no CVA tenderness.  Genitourinary:  Deferred.  Musculoskeletal:       Cervical back: She exhibits decreased range of motion and deformity. She exhibits no bony tenderness, no pain and no spasm.       Thoracic back: She exhibits deformity. She exhibits no tenderness, no bony tenderness, no pain and no spasm.       Lumbar back: Normal.  General DJD changes in major joints and digits.  Lymphadenopathy:       Head (right side): No submental, no submandibular, no tonsillar, no preauricular, no posterior auricular and no occipital adenopathy present.       Head (left side): No submental, no submandibular, no tonsillar, no preauricular, no posterior auricular and no occipital adenopathy present.    She has no cervical adenopathy.  She has no axillary adenopathy.       Right: No inguinal and no supraclavicular adenopathy present.       Left: No inguinal and no supraclavicular adenopathy present.  Neurological: She is alert and oriented to person, place, and time. She has normal strength. She displays no tremor. No cranial nerve deficit or sensory deficit. She exhibits normal muscle tone. Coordination normal. She displays no Babinski's sign on the right side. She displays no Babinski's sign on the left side.  Reflex Scores:      Tricep reflexes are 1+ on the right side and 1+ on the left side.      Bicep reflexes are 2+ on the right side and 2+ on the left side.      Brachioradialis reflexes are 1+ on the right side and 1+ on the left side.      Patellar reflexes are 1+ on the right side and 1+ on the left side. Skin: Skin is warm, dry and intact. She is not diaphoretic.  Skin is very thin and fragile; turgor is fair. Multiple Seb K lesions on ext and trunk; 2 lesions on R forehead. Large varicosities on lower extremities.  Psychiatric: She has a normal mood and affect. Her speech is normal and behavior is normal. Judgment and thought content normal. Cognition and  memory are normal.  Pt is anxious when speaking about skin issues and changes.    REVIEWED LAB RESULTS DRAWN AT DR. BRACKBILL'S OFFICE on 07/09/2013.     Assessment & Plan:  Routine general medical examination at a health care facility  Pigmented skin lesions- Mostly likely benign Seb Ks but other scarring noted.  Discussed with pt that chronic itching and skin lesions could be an indication of an internal problem that would require further testing and unknown treatment. She declines additional blood work or other testing at this time or in the future. She may fllow-up w/ DERM re: itching.  Screening for tuberculosis - Plan: TB Skin Test  Need for prophylactic vaccination against Streptococcus pneumoniae (pneumococcus) - Plan: Pneumococcal conjugate vaccine 13-valent IM

## 2013-08-22 ENCOUNTER — Ambulatory Visit (INDEPENDENT_AMBULATORY_CARE_PROVIDER_SITE_OTHER): Payer: Medicare Other | Admitting: Family

## 2013-08-22 ENCOUNTER — Encounter: Payer: Self-pay | Admitting: Family

## 2013-08-22 ENCOUNTER — Ambulatory Visit (HOSPITAL_COMMUNITY)
Admission: RE | Admit: 2013-08-22 | Discharge: 2013-08-22 | Disposition: A | Discharge status: Home / Self Care | Payer: Medicare Other | Source: Ambulatory Visit | Attending: Family | Admitting: Family

## 2013-08-22 VITALS — BP 100/68 | HR 70 | Resp 16 | Ht 62.5 in | Wt 116.0 lb

## 2013-08-22 DIAGNOSIS — I6529 Occlusion and stenosis of unspecified carotid artery: Secondary | ICD-10-CM

## 2013-08-22 DIAGNOSIS — I6523 Occlusion and stenosis of bilateral carotid arteries: Secondary | ICD-10-CM

## 2013-08-22 NOTE — Addendum Note (Signed)
Addended by: Mena Goes on: 08/22/2013 05:23 PM   Modules accepted: Orders

## 2013-08-22 NOTE — Progress Notes (Signed)
Established Carotid Patient   History of Present Illness  Vanessa [name] is a 80 y.o. female patient that Dr. Scot Dock has been monitoring for carotid artery stenosis; she has a known left internal carotid artery occlusion and minimal right ICA stenosis. She returns today for follow up. She spoke with our vein clinic nurse in the past re her varicosities and was given a pair of compression hose, but cannot get them on due to the weakness in her wrists.  Patient has not had previous carotid artery intervention.  Patient has Negative history of TIA or stroke symptom.  The patient denies amaurosis fugax or monocular blindness.  The patient  denies facial drooping.  Pt. denies hemiplegia.  The patient denies receptive or expressive aphasia.    Pt reports New Medical or Surgical History: development of extreme arthritis in her neck, finished taking prednisone for this.  The hematoma on her left lower leg has healed. She also has varicose veins in her legs.  Pt Diabetic: No Pt smoker: non-smoker  Pt meds include: Statin : Yes ASA: No Other anticoagulants/antiplatelets: coumadin for atrial fib   Past Medical History  Diagnosis Date  . Atrial fibrillation   . Neuropathy     PERIPHERAL  . Carotid artery occlusion     LEFT  . Scoliosis   . Hoarseness   . Headache(784.0)   . Dizziness   . Abdominal bloating   . Bruises easily   . Pruritus   . Hypercholesterolemia   . Fall at home Sept 2013, Dec. 2013  Jun 08, 2012  . Varicose veins     Social History History  Substance Use Topics  . Smoking status: Never Smoker   . Smokeless tobacco: Never Used  . Alcohol Use: No    Family History Family History  Problem Relation Age of Onset  . Heart disease    . Diabetes Mother   . Heart attack Mother   . Heart disease Mother   . Heart attack Father   . Stroke Father   . Cancer Sister   . Heart disease Maternal Grandmother     Surgical History Past Surgical History   Procedure Laterality Date  . Abdominal hysterectomy    . Spine surgery  1997  . Gallbladder surgery    . Cholecystectomy      Gal Bladder    Allergies  Allergen Reactions  . Tape Other (See Comments)    Tears skin.    Current Outpatient Prescriptions  Medication Sig Dispense Refill  . ALPRAZolam (XANAX) 0.25 MG tablet Take 1/2 tablet at bedtime as needed for sleep.  30 tablet  1  . atenolol (TENORMIN) 25 MG tablet TAKE 1 TABLET TWICE A DAY  60 tablet  6  . BIOTIN 5000 PO Take by mouth daily.      . Calcium-Vitamin D-Vitamin K (VIACTIV PO) Take by mouth.      . cetaphil (CETAPHIL) cream Apply topically as needed.      . Multiple Vitamin (MULTIVITAMIN) tablet Take 1 tablet by mouth daily.        Marland Kitchen omeprazole (PRILOSEC) 20 MG capsule Take 20 mg by mouth daily.      . simvastatin (ZOCOR) 40 MG tablet TAKE 0.5 TABLETS BY MOUTH DAILY.  30 tablet  3  . warfarin (COUMADIN) 2 MG tablet TAKE AS DIRECTED BY COUMADIN CLINIC  40 tablet  3  . zoster vaccine live, PF, (ZOSTAVAX) 44034 UNT/0.65ML injection Inject 19,400 Units into the skin once.  1  each  0   No current facility-administered medications for this visit.    Review of Systems : See HPI for pertinent positives and negatives.  Physical Examination  Filed Vitals:   08/22/13 1444 08/22/13 1455  BP: 110/70 100/68  Pulse: 70 70  Resp:  16  Height:  5' 2.5" (1.588 m)  Weight:  116 lb (52.617 kg)  SpO2:  100%   Body mass index is 20.87 kg/(m^2).  General: WDWN female in NAD GAIT: normal Eyes: PERRLA Pulmonary:  Non-labored, CTAB, Negative  Rales, Negative rhonchi, & Negative wheezing.  Cardiac: irregular Rhythm ,  Negative detected murmur.  VASCULAR EXAM Carotid Bruits Right Left   Negative Negative    Radial pulses are 2+ palpable and equal.                                                                                                                            LE Pulses Right Left       POPLITEAL  not palpable    not palpable       POSTERIOR TIBIAL  1+ palpable   2+ palpable        DORSALIS PEDIS      ANTERIOR TIBIAL 2+ palpable  2+ palpable     Gastrointestinal: soft, nontender, BS WNL, no r/g,  negative masses.  Musculoskeletal: Negative muscle atrophy/wasting. M/S 4/5 throughout, Extremities without ischemic changes, varicosities in both lower legs.  Neurologic: A&O X 3; Appropriate Affect ; SENSATION ;normal;  Speech is normal CN 2-12 intact, mild hearing loss, Pain and light touch intact in extremities, Motor exam as listed above.   Non-Invasive Vascular Imaging CAROTID DUPLEX 08/22/2013   CEREBROVASCULAR DUPLEX EVALUATION    INDICATION: Follow-up known carotid disease     PREVIOUS INTERVENTION(S): Left internal carotid artery occlusion    DUPLEX EXAM:     RIGHT  LEFT  Peak Systolic Velocities (cm/s) End Diastolic Velocities (cm/s) Plaque LOCATION Peak Systolic Velocities (cm/s) End Diastolic Velocities (cm/s) Plaque  127 20  CCA PROXIMAL 84 0   108 24  CCA MID 47 0   84 18  CCA DISTAL 34 0   83 0  ECA 68 0   96 24 HT ICA PROXIMAL 0 0 Occluded  66 25  ICA MID 0 0   97 26  ICA DISTAL 0 0     1.1 ICA / CCA Ratio (PSV) NA  Antegrade  Vertebral Flow Antegrade   962 Brachial Systolic Pressure (mmHg) 836  Within normal limits  Brachial Artery Waveforms Within normal limits     Plaque Morphology:  HM = Homogeneous, HT = Heterogeneous, CP = Calcific Plaque, SP = Smooth Plaque, IP = Irregular Plaque     ADDITIONAL FINDINGS:     IMPRESSION: 1. Minimal plaque (<40%) in the right internal carotid artery. 2. Confirmed left internal carotid artery occlusion. 3. Bilateral vertebral artery is within normal limits.    Compared to the previous exam:  No significant change compared to prior exam.     Assessment: Vanessa [name] is a 80 y.o. female who presents with asymptomatic minimal right ICA stenosis and confirmed left internal carotid artery occlusion. No significant change  compared to prior exam.   Plan: Follow-up in 1 year with Carotid Duplex scan.   I discussed in depth with the patient the nature of atherosclerosis, and emphasized the importance of maximal medical management including strict control of blood pressure, blood glucose, and lipid levels, obtaining regular exercise, and continued cessation of smoking.  The patient is aware that without maximal medical management the underlying atherosclerotic disease process will progress, limiting the benefit of any interventions. The patient was given information about stroke prevention and what symptoms should prompt the patient to seek immediate medical care. Thank you for allowing [place] to participate in this patient's care.  Clemon Chambers, RN, MSN, FNP-C [facility] Office: [phone]  Clinic Physician: Scot Dock  08/22/2013 2:05 PM

## 2013-08-22 NOTE — Patient Instructions (Signed)
Stroke Prevention Some medical conditions and behaviors are associated with an increased chance of having a stroke. You may prevent a stroke by making healthy choices and managing medical conditions. HOW CAN I REDUCE MY RISK OF HAVING A STROKE?   Stay physically active. Get at least 30 minutes of activity on most or all days.  Do not smoke. It may also be helpful to avoid exposure to secondhand smoke.  Limit alcohol use. Moderate alcohol use is considered to be:  No more than 2 drinks per day for men.  No more than 1 drink per day for nonpregnant women.  Eat healthy foods. This involves:  Eating 5 or more servings of fruits and vegetables a day.  Making dietary changes that address high blood pressure (hypertension), high cholesterol, diabetes, or obesity.  Manage your cholesterol levels.  Making food choices that are high in fiber and low in saturated fat, trans fat, and cholesterol may control cholesterol levels.  Take any prescribed medicines to control cholesterol as directed by your health care provider.  Manage your diabetes.  Controlling your carbohydrate and sugar intake is recommended to manage diabetes.  Take any prescribed medicines to control diabetes as directed by your health care provider.  Control your hypertension.  Making food choices that are low in salt (sodium), saturated fat, trans fat, and cholesterol is recommended to manage hypertension.  Take any prescribed medicines to control hypertension as directed by your health care provider.  Maintain a healthy weight.  Reducing calorie intake and making food choices that are low in sodium, saturated fat, trans fat, and cholesterol are recommended to manage weight.  Stop drug abuse.  Avoid taking birth control pills.  Talk to your health care provider about the risks of taking birth control pills if you are over 35 years old, smoke, get migraines, or have ever had a blood clot.  Get evaluated for sleep  disorders (sleep apnea).  Talk to your health care provider about getting a sleep evaluation if you snore a lot or have excessive sleepiness.  Take medicines only as directed by your health care provider.  For some people, aspirin or blood thinners (anticoagulants) are helpful in reducing the risk of forming abnormal blood clots that can lead to stroke. If you have the irregular heart rhythm of atrial fibrillation, you should be on a blood thinner unless there is a good reason you cannot take them.  Understand all your medicine instructions.  Make sure that other conditions (such as anemia or atherosclerosis) are addressed. SEEK IMMEDIATE MEDICAL CARE IF:   You have sudden weakness or numbness of the face, arm, or leg, especially on one side of the body.  Your face or eyelid droops to one side.  You have sudden confusion.  You have trouble speaking (aphasia) or understanding.  You have sudden trouble seeing in one or both eyes.  You have sudden trouble walking.  You have dizziness.  You have a loss of balance or coordination.  You have a sudden, severe headache with no known cause.  You have new chest pain or an irregular heartbeat. Any of these symptoms may represent a serious problem that is an emergency. Do not wait to see if the symptoms will go away. Get medical help at once. Call your local emergency services (911 in [place] Do not drive yourself to the hospital. Document Released: 02/05/2004 Document Revised: 05/14/2013 Document Reviewed: 06/30/2012 [employer] Patient Information 2015 [employer]. This information is not intended to replace advice given   to you by your health care provider. Make sure you discuss any questions you have with your health care provider.  

## 2013-08-24 ENCOUNTER — Telehealth: Payer: Self-pay | Admitting: *Deleted

## 2013-08-24 ENCOUNTER — Ambulatory Visit (INDEPENDENT_AMBULATORY_CARE_PROVIDER_SITE_OTHER): Payer: Medicare Other | Admitting: *Deleted

## 2013-08-24 DIAGNOSIS — Z111 Encounter for screening for respiratory tuberculosis: Secondary | ICD-10-CM

## 2013-08-24 LAB — TB SKIN TEST
Induration: 0 mm
TB Skin Test: NEGATIVE

## 2013-08-24 NOTE — Telephone Encounter (Signed)
Pt was in the office today for TB skin test read, results negative with 0 induration. She had a question regarding paperwork that will need to be filled out for living facility that she will need.

## 2013-08-28 ENCOUNTER — Inpatient Hospital Stay (HOSPITAL_COMMUNITY)
Admission: EM | Admit: 2013-08-28 | Discharge: 2013-09-03 | DRG: 183 | Disposition: A | Discharge status: Skilled Nursing Facility | Payer: Medicare Other | Attending: Surgery | Admitting: Surgery

## 2013-08-28 ENCOUNTER — Encounter (HOSPITAL_COMMUNITY): Payer: Self-pay | Admitting: Emergency Medicine

## 2013-08-28 ENCOUNTER — Emergency Department (HOSPITAL_COMMUNITY): Payer: Medicare Other

## 2013-08-28 DIAGNOSIS — Z823 Family history of stroke: Secondary | ICD-10-CM

## 2013-08-28 DIAGNOSIS — I739 Peripheral vascular disease, unspecified: Secondary | ICD-10-CM | POA: Diagnosis present

## 2013-08-28 DIAGNOSIS — I4891 Unspecified atrial fibrillation: Secondary | ICD-10-CM | POA: Diagnosis present

## 2013-08-28 DIAGNOSIS — Z79899 Other long term (current) drug therapy: Secondary | ICD-10-CM | POA: Diagnosis not present

## 2013-08-28 DIAGNOSIS — Z8249 Family history of ischemic heart disease and other diseases of the circulatory system: Secondary | ICD-10-CM

## 2013-08-28 DIAGNOSIS — G589 Mononeuropathy, unspecified: Secondary | ICD-10-CM | POA: Diagnosis present

## 2013-08-28 DIAGNOSIS — Z5189 Encounter for other specified aftercare: Secondary | ICD-10-CM | POA: Diagnosis not present

## 2013-08-28 DIAGNOSIS — E7800 Pure hypercholesterolemia, unspecified: Secondary | ICD-10-CM | POA: Diagnosis present

## 2013-08-28 DIAGNOSIS — R071 Chest pain on breathing: Secondary | ICD-10-CM | POA: Diagnosis not present

## 2013-08-28 DIAGNOSIS — S2249XA Multiple fractures of ribs, unspecified side, initial encounter for closed fracture: Principal | ICD-10-CM | POA: Diagnosis present

## 2013-08-28 DIAGNOSIS — Z9181 History of falling: Secondary | ICD-10-CM | POA: Diagnosis not present

## 2013-08-28 DIAGNOSIS — I509 Heart failure, unspecified: Secondary | ICD-10-CM | POA: Diagnosis not present

## 2013-08-28 DIAGNOSIS — G609 Hereditary and idiopathic neuropathy, unspecified: Secondary | ICD-10-CM | POA: Diagnosis not present

## 2013-08-28 DIAGNOSIS — R52 Pain, unspecified: Secondary | ICD-10-CM | POA: Diagnosis not present

## 2013-08-28 DIAGNOSIS — J811 Chronic pulmonary edema: Secondary | ICD-10-CM | POA: Diagnosis not present

## 2013-08-28 DIAGNOSIS — I209 Angina pectoris, unspecified: Secondary | ICD-10-CM | POA: Diagnosis not present

## 2013-08-28 DIAGNOSIS — M1990 Unspecified osteoarthritis, unspecified site: Secondary | ICD-10-CM | POA: Diagnosis present

## 2013-08-28 DIAGNOSIS — S271XXA Traumatic hemothorax, initial encounter: Secondary | ICD-10-CM | POA: Diagnosis present

## 2013-08-28 DIAGNOSIS — F411 Generalized anxiety disorder: Secondary | ICD-10-CM | POA: Diagnosis not present

## 2013-08-28 DIAGNOSIS — D62 Acute posthemorrhagic anemia: Secondary | ICD-10-CM | POA: Diagnosis present

## 2013-08-28 DIAGNOSIS — S2239XA Fracture of one rib, unspecified side, initial encounter for closed fracture: Secondary | ICD-10-CM | POA: Diagnosis not present

## 2013-08-28 DIAGNOSIS — W010XXA Fall on same level from slipping, tripping and stumbling without subsequent striking against object, initial encounter: Secondary | ICD-10-CM | POA: Diagnosis present

## 2013-08-28 DIAGNOSIS — Z833 Family history of diabetes mellitus: Secondary | ICD-10-CM

## 2013-08-28 DIAGNOSIS — Z7901 Long term (current) use of anticoagulants: Secondary | ICD-10-CM

## 2013-08-28 DIAGNOSIS — J9819 Other pulmonary collapse: Secondary | ICD-10-CM | POA: Diagnosis not present

## 2013-08-28 DIAGNOSIS — R51 Headache: Secondary | ICD-10-CM | POA: Diagnosis not present

## 2013-08-28 DIAGNOSIS — I6529 Occlusion and stenosis of unspecified carotid artery: Secondary | ICD-10-CM | POA: Diagnosis present

## 2013-08-28 DIAGNOSIS — R1310 Dysphagia, unspecified: Secondary | ICD-10-CM | POA: Diagnosis not present

## 2013-08-28 DIAGNOSIS — M4120 Other idiopathic scoliosis, site unspecified: Secondary | ICD-10-CM | POA: Diagnosis present

## 2013-08-28 DIAGNOSIS — W19XXXA Unspecified fall, initial encounter: Secondary | ICD-10-CM | POA: Diagnosis present

## 2013-08-28 DIAGNOSIS — D649 Anemia, unspecified: Secondary | ICD-10-CM | POA: Diagnosis not present

## 2013-08-28 DIAGNOSIS — T1490XA Injury, unspecified, initial encounter: Secondary | ICD-10-CM | POA: Diagnosis not present

## 2013-08-28 DIAGNOSIS — R079 Chest pain, unspecified: Secondary | ICD-10-CM | POA: Diagnosis not present

## 2013-08-28 DIAGNOSIS — S2232XA Fracture of one rib, left side, initial encounter for closed fracture: Secondary | ICD-10-CM

## 2013-08-28 DIAGNOSIS — J90 Pleural effusion, not elsewhere classified: Secondary | ICD-10-CM | POA: Diagnosis not present

## 2013-08-28 DIAGNOSIS — IMO0001 Reserved for inherently not codable concepts without codable children: Secondary | ICD-10-CM | POA: Diagnosis not present

## 2013-08-28 LAB — PROTIME-INR
INR: 2.4 — ABNORMAL HIGH (ref 0.00–1.49)
Prothrombin Time: 26.2 seconds — ABNORMAL HIGH (ref 11.6–15.2)

## 2013-08-28 LAB — CBC WITH DIFFERENTIAL/PLATELET
Basophils Absolute: 0 10*3/uL (ref 0.0–0.1)
Basophils Relative: 0 % (ref 0–1)
Eosinophils Absolute: 0.1 10*3/uL (ref 0.0–0.7)
Eosinophils Relative: 1 % (ref 0–5)
HCT: 41 % (ref 36.0–46.0)
Hemoglobin: 13.6 g/dL (ref 12.0–15.0)
Lymphocytes Relative: 14 % (ref 12–46)
Lymphs Abs: 1.1 10*3/uL (ref 0.7–4.0)
MCH: 31.9 pg (ref 26.0–34.0)
MCHC: 33.2 g/dL (ref 30.0–36.0)
MCV: 96 fL (ref 78.0–100.0)
Monocytes Absolute: 0.9 10*3/uL (ref 0.1–1.0)
Monocytes Relative: 11 % (ref 3–12)
Neutro Abs: 5.7 10*3/uL (ref 1.7–7.7)
Neutrophils Relative %: 74 % (ref 43–77)
Platelets: 194 10*3/uL (ref 150–400)
RBC: 4.27 MIL/uL (ref 3.87–5.11)
RDW: 14.3 % (ref 11.5–15.5)
WBC: 7.7 10*3/uL (ref 4.0–10.5)

## 2013-08-28 LAB — BASIC METABOLIC PANEL
Anion gap: 11 (ref 5–15)
BUN: 17 mg/dL (ref 6–23)
CO2: 27 mEq/L (ref 19–32)
Calcium: 9.5 mg/dL (ref 8.4–10.5)
Chloride: 103 mEq/L (ref 96–112)
Creatinine, Ser: 0.87 mg/dL (ref 0.50–1.10)
GFR calc Af Amer: 65 mL/min — ABNORMAL LOW (ref >90)
GFR calc non Af Amer: 57 mL/min — ABNORMAL LOW (ref >90)
Glucose, Bld: 145 mg/dL — ABNORMAL HIGH (ref 70–99)
Potassium: 4.3 mEq/L (ref 3.7–5.3)
Sodium: 141 mEq/L (ref 137–147)

## 2013-08-28 LAB — MRSA PCR SCREENING: MRSA by PCR: NEGATIVE

## 2013-08-28 LAB — I-STAT TROPONIN, ED: Troponin i, poc: 0 ng/mL (ref 0.00–0.08)

## 2013-08-28 MED ORDER — ATENOLOL 25 MG PO TABS
25.0000 mg | ORAL_TABLET | Freq: Two times a day (BID) | ORAL | Status: DC
  Administered 2013-08-28 – 2013-09-03 (×6): 25 mg via ORAL
  Filled 2013-08-28 (×18): qty 1

## 2013-08-28 MED ORDER — HYDROCODONE-ACETAMINOPHEN 5-325 MG PO TABS: 1.0000 | ORAL_TABLET | ORAL | Status: DC | PRN

## 2013-08-28 MED ORDER — ONDANSETRON HCL 4 MG/2ML IJ SOLN: 4.0000 mg | Freq: Four times a day (QID) | INTRAMUSCULAR | Status: DC | PRN

## 2013-08-28 MED ORDER — MORPHINE SULFATE 2 MG/ML IJ SOLN
1.0000 mg | INTRAMUSCULAR | Status: DC | PRN
  Administered 2013-08-28: 4 mg via INTRAVENOUS
  Filled 2013-08-28: qty 2

## 2013-08-28 MED ORDER — DOCUSATE SODIUM 100 MG PO CAPS
100.0000 mg | ORAL_CAPSULE | Freq: Two times a day (BID) | ORAL | Status: DC
  Administered 2013-08-28 – 2013-08-31 (×4): 100 mg via ORAL
  Filled 2013-08-28 (×9): qty 1

## 2013-08-28 MED ORDER — SODIUM CHLORIDE 0.9 % IV SOLN
INTRAVENOUS | Status: DC
  Administered 2013-08-28: 50 mL/h via INTRAVENOUS

## 2013-08-28 MED ORDER — MORPHINE SULFATE 4 MG/ML IJ SOLN
4.0000 mg | Freq: Once | INTRAMUSCULAR | Status: AC
  Administered 2013-08-28: 4 mg via INTRAVENOUS
  Filled 2013-08-28: qty 1

## 2013-08-28 MED ORDER — SODIUM CHLORIDE 0.9 % IJ SOLN: 9.0000 mL | INTRAMUSCULAR | Status: DC | PRN

## 2013-08-28 MED ORDER — NALOXONE HCL 0.4 MG/ML IJ SOLN: 0.4000 mg | INTRAMUSCULAR | Status: DC | PRN

## 2013-08-28 MED ORDER — DIPHENHYDRAMINE HCL 12.5 MG/5ML PO ELIX
12.5000 mg | ORAL_SOLUTION | Freq: Four times a day (QID) | ORAL | Status: DC | PRN
  Filled 2013-08-28: qty 5

## 2013-08-28 MED ORDER — SIMVASTATIN 20 MG PO TABS
20.0000 mg | ORAL_TABLET | Freq: Every day | ORAL | Status: DC
  Administered 2013-08-29 – 2013-09-02 (×5): 20 mg via ORAL
  Filled 2013-08-28: qty 1
  Filled 2013-08-28: qty 2
  Filled 2013-08-28 (×5): qty 1

## 2013-08-28 MED ORDER — ONDANSETRON HCL 4 MG PO TABS: 4.0000 mg | ORAL_TABLET | Freq: Four times a day (QID) | ORAL | Status: DC | PRN

## 2013-08-28 MED ORDER — DIPHENHYDRAMINE HCL 50 MG/ML IJ SOLN: 12.5000 mg | Freq: Four times a day (QID) | INTRAMUSCULAR | Status: DC | PRN

## 2013-08-28 MED ORDER — HYDROMORPHONE 0.3 MG/ML IV SOLN
INTRAVENOUS | Status: DC
  Administered 2013-08-28: 0.3 mg via INTRAVENOUS
  Administered 2013-08-29: 0.4 mg via INTRAVENOUS
  Administered 2013-08-29: 0.2 mg via INTRAVENOUS
  Administered 2013-08-29: 0.999 mg via INTRAVENOUS
  Filled 2013-08-28: qty 25

## 2013-08-28 MED ORDER — ALPRAZOLAM 0.25 MG PO TABS
0.2500 mg | ORAL_TABLET | Freq: Every evening | ORAL | Status: DC | PRN
  Administered 2013-08-31 – 2013-09-02 (×2): 0.25 mg via ORAL
  Filled 2013-08-28 (×2): qty 1

## 2013-08-28 NOTE — ED Notes (Signed)
Arrives [facility] from home. Pt got up to answer her cell phone, lost her balance, fell and hit her left flank on a chair. No LOC. GCS 15.

## 2013-08-28 NOTE — H&P (Signed)
History   Vanessa [name] is an 80 y.o. female.   Chief Complaint:  Chief Complaint  Patient presents with  . Fall    Fall Associated symptoms include chest pain.  this is a 80 year old female who presents status post falling. She got up from her chair to answer the phone and fell against a chair hitting her left side. She did not hurt her neck. There was no loss of consciousness or head trauma. She presents only complaining of left chest pain. She does have mild shortness of breath as well. She reports chronic headaches.  Past Medical History  Diagnosis Date  . Atrial fibrillation   . Neuropathy     PERIPHERAL  . Carotid artery occlusion     LEFT  . Scoliosis   . Hoarseness   . Headache(784.0)   . Dizziness   . Abdominal bloating   . Bruises easily   . Pruritus   . Hypercholesterolemia   . Fall at home Sept 2013, Dec. 2013  Jun 08, 2012  . Varicose veins     Past Surgical History  Procedure Laterality Date  . Gallbladder surgery    . Abdominal hysterectomy  1954  . Spine surgery  1997  . Cholecystectomy  1997    GalL Bladder    Family History  Problem Relation Age of Onset  . Heart disease    . Diabetes Mother   . Heart attack Mother   . Heart disease Mother   . Heart attack Father   . Stroke Father   . Cancer Sister   . Heart disease Maternal Grandmother    Social History:  reports that she has never smoked. She has never used smokeless tobacco. She reports that she does not drink alcohol or use illicit drugs.  Allergies   Allergies  Allergen Reactions  . Tape Other (See Comments)    Tears skin. Band-Aids    Home Medications   (Not in a hospital admission)  Trauma Course   Results for orders placed during the hospital encounter of 08/28/13 (from the past 48 hour(s))  CBC WITH DIFFERENTIAL     Status: None   Collection Time    08/28/13  3:47 PM      Result Value Ref Range   WBC 7.7  4.0 - 10.5 K/uL   RBC 4.27  3.87 - 5.11 MIL/uL    Hemoglobin 13.6  12.0 - 15.0 g/dL   HCT 41.0  36.0 - 46.0 %   MCV 96.0  78.0 - 100.0 fL   MCH 31.9  26.0 - 34.0 pg   MCHC 33.2  30.0 - 36.0 g/dL   RDW 14.3  11.5 - 15.5 %   Platelets 194  150 - 400 K/uL   Neutrophils Relative % 74  43 - 77 %   Neutro Abs 5.7  1.7 - 7.7 K/uL   Lymphocytes Relative 14  12 - 46 %   Lymphs Abs 1.1  0.7 - 4.0 K/uL   Monocytes Relative 11  3 - 12 %   Monocytes Absolute 0.9  0.1 - 1.0 K/uL   Eosinophils Relative 1  0 - 5 %   Eosinophils Absolute 0.1  0.0 - 0.7 K/uL   Basophils Relative 0  0 - 1 %   Basophils Absolute 0.0  0.0 - 0.1 K/uL  BASIC METABOLIC PANEL     Status: Abnormal   Collection Time    08/28/13  3:47 PM      Result Value Ref  Range   Sodium 141  137 - 147 mEq/L   Potassium 4.3  3.7 - 5.3 mEq/L   Chloride 103  96 - 112 mEq/L   CO2 27  19 - 32 mEq/L   Glucose, Bld 145 (*) 70 - 99 mg/dL   BUN 17  6 - 23 mg/dL   Creatinine, Ser 0.87  0.50 - 1.10 mg/dL   Calcium 9.5  8.4 - 10.5 mg/dL   GFR calc non Af Amer 57 (*) >90 mL/min   GFR calc Af Amer 65 (*) >90 mL/min   Comment: (NOTE)     The eGFR has been calculated using the CKD EPI equation.     This calculation has not been validated in all clinical situations.     eGFR's persistently <90 mL/min signify possible Chronic Kidney     Disease.   Anion gap 11  5 - 15  PROTIME-INR     Status: Abnormal   Collection Time    08/28/13  3:47 PM      Result Value Ref Range   Prothrombin Time 26.2 (*) 11.6 - 15.2 seconds   INR 2.40 (*) 0.00 - 1.49  I-STAT TROPOININ, ED     Status: None   Collection Time    08/28/13  4:22 PM      Result Value Ref Range   Troponin i, poc 0.00  0.00 - 0.08 ng/mL   Comment 3            Comment: Due to the release kinetics of cTnI,     a negative result within the first hours     of the onset of symptoms does not rule out     myocardial infarction with certainty.     If myocardial infarction is still suspected,     repeat the test at appropriate intervals.   Dg  Ribs Unilateral W/chest Left  08/28/2013   CLINICAL DATA:  [place] today at after losing her balance, LEFT flank pain  EXAM: LEFT RIBS AND CHEST - 3+ VIEW  COMPARISON:  Chest radiographs 06/09/2012  FINDINGS: Enlargement of cardiac silhouette.  Mild tortuosity of thoracic aorta.  Mediastinal contours and pulmonary vascularity otherwise normal.  Chronic bronchitic changes with minimal LEFT basilar atelectasis.  No acute infiltrate, pleural effusion or pneumothorax.  Diffuse osseous demineralization.  BB placed at site of symptoms lower lateral LEFT chest.  Multiple LEFT rib fractures identified at the LEFT fifth, sixth, seventh, eighth and ninth ribs, lateral to posterolateral in location.  Degenerative disc disease changes and scoliosis of the thoracolumbar spine with evidence of prior lumbar spinal fixation.  Superimposed costal cartilaginous calcifications identified.  IMPRESSION: Chronic bronchitic changes and minimal LEFT basilar atelectasis.  Displaced fractures of the LEFT fifth through ninth ribs as above.   Electronically Signed   By: Lavonia Dana M.D.   On: 08/28/2013 16:54    Review of Systems  Respiratory: Positive for shortness of breath.   Cardiovascular: Positive for chest pain.  All other systems reviewed and are negative.   Blood pressure 112/69, pulse 96, temperature 98.2 F (36.8 C), temperature source Oral, resp. rate 17, height _0  (1.575 m), weight 115 lb (52.164 kg), SpO2 94.00%. Physical Exam  Constitutional: She is oriented to person, place, and time. She appears distressed.  Elderly, thin female mildly uncomfortable  HENT:  Head: Normocephalic and atraumatic.  Right Ear: External ear normal.  Left Ear: External ear normal.  Nose: Nose normal.  Mouth/Throat: Oropharynx is clear and moist.  Eyes: Conjunctivae are normal. Pupils are equal, round, and reactive to light.  Neck: Normal range of motion. Neck supple. No tracheal deviation present.  No cervical tenderness   Cardiovascular: Normal heart sounds and intact distal pulses.   No murmur heard. Irregular rhythm  Respiratory: Breath sounds normal. No respiratory distress. She has no wheezes. She exhibits tenderness.  GI: Soft. There is no tenderness.  Musculoskeletal: Normal range of motion. She exhibits no edema and no tenderness.  Neurological: She is alert and oriented to person, place, and time.  Skin: Skin is warm and dry.  Psychiatric: Her behavior is normal. Judgment normal.     Assessment/Plan 80 year old female status post fall with multiple left rib fractures including ribs 5 through 9.  Currently, there is no obvious pneumothorax. She appears to have no other injuries. Because of her advanced age and pulmonary status, she needs admission to the intensive care unit for close pulmonary monitoring and pain control. She may need her ribs injected or and epidural if she develops any respiratory distress. I explained this to her in detail.  Chrissy Ealey A 08/28/2013, 6:57 PM   Procedures

## 2013-08-28 NOTE — ED Notes (Signed)
Patient transported to X-ray 

## 2013-08-28 NOTE — ED Provider Notes (Signed)
CSN: [id number]     Arrival date & time 08/28/13  1544 History   First MD Initiated Contact with Patient 08/28/13 1545     Chief Complaint  Patient presents with  . Fall     (Consider location/radiation/quality/duration/timing/severity/associated sxs/prior Treatment) The history is provided by the patient.  Vanessa [name] is a 80 y.o. female hx of afib on coumadin, frequent falls here with fall. She got up to answer the phone and lost her balance and hit left side of her ribs on a chair. Denies head or neck injury. Denies passing out. Denies abdominal pain. Denies other extremity trauma.    Past Medical History  Diagnosis Date  . Atrial fibrillation   . Neuropathy     PERIPHERAL  . Carotid artery occlusion     LEFT  . Scoliosis   . Hoarseness   . Headache(784.0)   . Dizziness   . Abdominal bloating   . Bruises easily   . Pruritus   . Hypercholesterolemia   . Fall at home Sept 2013, Dec. 2013  Jun 08, 2012  . Varicose veins    Past Surgical History  Procedure Laterality Date  . Gallbladder surgery    . Abdominal hysterectomy  1954  . Spine surgery  1997  . Cholecystectomy  1997    GalL Bladder   Family History  Problem Relation Age of Onset  . Heart disease    . Diabetes Mother   . Heart attack Mother   . Heart disease Mother   . Heart attack Father   . Stroke Father   . Cancer Sister   . Heart disease Maternal Grandmother    History  Substance Use Topics  . Smoking status: Never Smoker   . Smokeless tobacco: Never Used  . Alcohol Use: No   OB History   Grav Para Term Preterm Abortions TAB SAB Ect Mult Living                 Review of Systems  Musculoskeletal:       L rib pain   All other systems reviewed and are negative.     Allergies  Tape  Home Medications   Prior to Admission medications   Medication Sig Start Date End Date Taking? Authorizing Provider  ALPRAZolam Duanne Moron) 0.25 MG tablet Take 0.25 mg by mouth at bedtime as needed for  anxiety.   Yes Historical Provider, MD  atenolol (TENORMIN) 25 MG tablet Take 25 mg by mouth 2 (two) times daily.   Yes Historical Provider, MD  BIOTIN 5000 PO Take by mouth daily.   Yes Historical Provider, MD  Calcium-Vitamin D-Vitamin K (VIACTIV PO) Take 1 tablet by mouth daily.    Yes Historical Provider, MD  cetaphil (CETAPHIL) cream Apply topically as needed.   Yes Historical Provider, MD  Multiple Vitamin (MULTIVITAMIN) tablet Take 1 tablet by mouth daily.     Yes Historical Provider, MD  simvastatin (ZOCOR) 40 MG tablet Take 20 mg by mouth daily.   Yes Historical Provider, MD  warfarin (COUMADIN) 2 MG tablet Take 2-4 mg by mouth daily. 2mg  daily except 4mg  on Sunday   Yes Historical Provider, MD   BP 112/69  Pulse 96  Temp(Src) 98.2 F (36.8 C) (Oral)  Resp 17  Ht 5\' 2"  (1.575 m)  Wt 115 lb (52.164 kg)  BMI 21.03 kg/m2  SpO2 94% Physical Exam  Nursing note and vitals reviewed. Constitutional: She is oriented to person, place, and time.  Uncomfortable  HENT:  Head: Normocephalic and atraumatic.  Mouth/Throat: Oropharynx is clear and moist.  Eyes: Conjunctivae and EOM are normal. Pupils are equal, round, and reactive to light.  Neck: Normal range of motion. Neck supple.  Cardiovascular: Normal rate, regular rhythm and normal heart sounds.   Pulmonary/Chest:  Dec breath sounds L side. + L chest tenderness. ? Flail segment.   Abdominal: Soft. Bowel sounds are normal. She exhibits no distension. There is no tenderness. There is no rebound and no guarding.  Musculoskeletal: Normal range of motion. She exhibits no edema and no tenderness.  Neurological: She is alert and oriented to person, place, and time. No cranial nerve deficit. Coordination normal.  Skin: Skin is warm and dry.  Psychiatric: She has a normal mood and affect. Her behavior is normal. Judgment and thought content normal.    ED Course  Procedures (including critical care time) Labs Review Labs Reviewed   BASIC METABOLIC PANEL - Abnormal; Notable for the following:    Glucose, Bld 145 (*)    GFR calc non Af Amer 57 (*)    GFR calc Af Amer 65 (*)    All other components within normal limits  PROTIME-INR - Abnormal; Notable for the following:    Prothrombin Time 26.2 (*)    INR 2.40 (*)    All other components within normal limits  CBC WITH DIFFERENTIAL  I-STAT TROPOININ, ED    Imaging Review Dg Ribs Unilateral W/chest Left  08/28/2013   CLINICAL DATA:  [place] today at after losing her balance, LEFT flank pain  EXAM: LEFT RIBS AND CHEST - 3+ VIEW  COMPARISON:  Chest radiographs 06/09/2012  FINDINGS: Enlargement of cardiac silhouette.  Mild tortuosity of thoracic aorta.  Mediastinal contours and pulmonary vascularity otherwise normal.  Chronic bronchitic changes with minimal LEFT basilar atelectasis.  No acute infiltrate, pleural effusion or pneumothorax.  Diffuse osseous demineralization.  BB placed at site of symptoms lower lateral LEFT chest.  Multiple LEFT rib fractures identified at the LEFT fifth, sixth, seventh, eighth and ninth ribs, lateral to posterolateral in location.  Degenerative disc disease changes and scoliosis of the thoracolumbar spine with evidence of prior lumbar spinal fixation.  Superimposed costal cartilaginous calcifications identified.  IMPRESSION: Chronic bronchitic changes and minimal LEFT basilar atelectasis.  Displaced fractures of the LEFT fifth through ninth ribs as above.   Electronically Signed   By: Lavonia Dana M.D.   On: 08/28/2013 16:54     EKG Interpretation   Date/Time:  Tuesday August 28 2013 15:52:36 EDT Ventricular Rate:  76 PR Interval:    QRS Duration: 86 QT Interval:  384 QTC Calculation: 432 R Axis:   80 Text Interpretation:  Atrial fibrillation RSR' in V1 or V2, probably  normal variant No significant change since last tracing Confirmed by Phares Zaccone   MD, Zyad Boomer ([id number]) on 08/28/2013 4:05:56 PM      MDM   Final diagnoses:  None    Vanessa [name] is a 80 y.o. female here with fall. Will get xray to r/o fracture. Will give pain meds.   6pm Xray showed displaced fracture 5th through 9th. Trauma called and will admit.     Wandra Arthurs, MD 08/28/13 [phone]

## 2013-08-28 NOTE — ED Notes (Signed)
MD at bedside. Dr. Darl Householder.

## 2013-08-29 ENCOUNTER — Inpatient Hospital Stay (HOSPITAL_COMMUNITY): Payer: Medicare Other

## 2013-08-29 LAB — BASIC METABOLIC PANEL
Anion gap: 10 (ref 5–15)
BUN: 17 mg/dL (ref 6–23)
CO2: 27 mEq/L (ref 19–32)
Calcium: 9.3 mg/dL (ref 8.4–10.5)
Chloride: 101 mEq/L (ref 96–112)
Creatinine, Ser: 0.85 mg/dL (ref 0.50–1.10)
GFR calc Af Amer: 67 mL/min — ABNORMAL LOW (ref >90)
GFR calc non Af Amer: 58 mL/min — ABNORMAL LOW (ref >90)
Glucose, Bld: 144 mg/dL — ABNORMAL HIGH (ref 70–99)
Potassium: 4.7 mEq/L (ref 3.7–5.3)
Sodium: 138 mEq/L (ref 137–147)

## 2013-08-29 LAB — CBC
HCT: 37.5 % (ref 36.0–46.0)
Hemoglobin: 12.3 g/dL (ref 12.0–15.0)
MCH: 31.1 pg (ref 26.0–34.0)
MCHC: 32.8 g/dL (ref 30.0–36.0)
MCV: 94.9 fL (ref 78.0–100.0)
Platelets: 191 10*3/uL (ref 150–400)
RBC: 3.95 MIL/uL (ref 3.87–5.11)
RDW: 14.5 % (ref 11.5–15.5)
WBC: 13.3 10*3/uL — ABNORMAL HIGH (ref 4.0–10.5)

## 2013-08-29 LAB — PROTIME-INR
INR: 2.33 — ABNORMAL HIGH (ref 0.00–1.49)
Prothrombin Time: 25.6 seconds — ABNORMAL HIGH (ref 11.6–15.2)

## 2013-08-29 MED ORDER — TRAMADOL HCL 50 MG PO TABS
100.0000 mg | ORAL_TABLET | Freq: Four times a day (QID) | ORAL | Status: DC
  Administered 2013-08-29 – 2013-09-01 (×10): 100 mg via ORAL
  Filled 2013-08-29 (×11): qty 2

## 2013-08-29 MED ORDER — HYDROCODONE-ACETAMINOPHEN 5-325 MG PO TABS: 0.5000 | ORAL_TABLET | ORAL | Status: DC | PRN

## 2013-08-29 MED ORDER — VITAMIN K1 10 MG/ML IJ SOLN
5.0000 mg | Freq: Once | INTRAVENOUS | Status: AC
  Administered 2013-08-29: 5 mg via INTRAVENOUS
  Filled 2013-08-29: qty 0.5

## 2013-08-29 MED ORDER — NAPROXEN 500 MG PO TABS
500.0000 mg | ORAL_TABLET | Freq: Two times a day (BID) | ORAL | Status: DC
  Administered 2013-08-29 – 2013-09-03 (×8): 500 mg via ORAL
  Filled 2013-08-29 (×2): qty 1
  Filled 2013-08-29: qty 2
  Filled 2013-08-29 (×10): qty 1
  Filled 2013-08-29: qty 2

## 2013-08-29 MED ORDER — WARFARIN SODIUM 2 MG PO TABS: 2.0000 mg | ORAL_TABLET | Freq: Every day | ORAL | Status: DC

## 2013-08-29 MED ORDER — HYDROCODONE-ACETAMINOPHEN 5-325 MG PO TABS: 2.0000 | ORAL_TABLET | ORAL | Status: DC | PRN

## 2013-08-29 MED ORDER — MORPHINE SULFATE 2 MG/ML IJ SOLN
2.0000 mg | INTRAMUSCULAR | Status: DC | PRN
  Administered 2013-08-29: 2 mg via INTRAVENOUS
  Filled 2013-08-29: qty 1

## 2013-08-29 MED ORDER — HYDROCODONE-ACETAMINOPHEN 5-325 MG PO TABS
1.0000 | ORAL_TABLET | ORAL | Status: DC | PRN
  Administered 2013-08-29: 1 via ORAL
  Filled 2013-08-29: qty 1

## 2013-08-29 NOTE — Progress Notes (Signed)
UR completed.  Daxx Tiggs, RN BSN [facility] CCM Trauma/Neuro ICU Case Manager [phone]  

## 2013-08-29 NOTE — Progress Notes (Signed)
Patient still having a significant amount of pain, not taking deep breaths.  There has been a significant increase in the hemothorax on the left.  Not doing well with IS.  Not using PCA.  Patient to have meds adjusted to control pain better.  Aggressive with PT and IS, get OOB with PT.  Reverse coumadin.  This patient has been seen and I agree with the findings and treatment plan.  Kathryne Eriksson. Dahlia Bailiff, MD, [facility] [phone] (pager) [phone] (direct pager) Trauma Surgeon

## 2013-08-29 NOTE — Progress Notes (Signed)
Pt OOB to chair with assist. C/o pain. Explained plan of pain meds to pt. Stepped out of room and heard pt struggling to cough. Pt had secretions dripping from mouth, was leaning forward with hands on chest. Asked pt if she was having difficulty breathing. She nodded yes. Noticed pt had moved fork from fruit cup. Asked pt if she was choking. She nodded yes. Attempted to dislodge from chair position. Yelled for assistance. Pt was helped by other nursing staff to standing position. She began speaking and said she was having a hard time getting oxygen. [place] placed back on pt. PT returned to chair. Pt states she has had difficultly with eating and was being seen by ENT. PA at bedside. Orders written. Son informed in waiting room and brought to bedside. Will cont to monitor.

## 2013-08-29 NOTE — Progress Notes (Signed)
Patient ID: Vanessa [name], female   DOB: 1920-08-09, 80 y.o.   MRN: [id number]   LOS: 1 day   Subjective: C/o severe pain.   Objective: Vital signs in last 24 hours: Temp:  [97.5 F (36.4 C)-98.4 F (36.9 C)] 97.5 F (36.4 C) (08/19 0800) Pulse Rate:  [74-111] 79 (08/19 1000) Resp:  [12-27] 16 (08/19 0800) BP: (80-155)/(45-93) 80/60 mmHg (08/19 1000) SpO2:  [93 %-99 %] 96 % (08/19 1000) Weight:  [115 lb (52.164 kg)-117 lb 4.6 oz (53.2 kg)] 117 lb 4.6 oz (53.2 kg) (08/18 2000) Last BM Date: 08/28/13   IS: 246ml   Laboratory  CBC  Recent Labs  08/28/13 1547 08/29/13 0223  WBC 7.7 13.3*  HGB 13.6 12.3  HCT 41.0 37.5  PLT 194 191   BMET  Recent Labs  08/28/13 1547 08/29/13 0223  NA 141 138  K 4.3 4.7  CL 103 101  CO2 27 27  GLUCOSE 145* 144*  BUN 17 17  CREATININE 0.87 0.85  CALCIUM 9.5 9.3    Radiology Results PORTABLE CHEST - 1 VIEW  COMPARISON: 08/28/2013; 06/09/2012  FINDINGS:  Grossly unchanged enlarged cardiac silhouette and mediastinal  contours with atherosclerotic plaque within the thoracic aorta. The  pulmonary vasculature appears less distinct on present examination  with cephalization of flow. Grossly unchanged small bilateral  pleural effusions. Slight worsening of bibasilar heterogeneous  opacities, left greater than right. No pneumothorax. Minimally  displaced fractures involving the posterior and lateral aspects of  the left 5th through 9th ribs are re- demonstrated.  IMPRESSION:  1. Findings suggestive of worsening pulmonary edema and bibasilar  opacities, left greater than right, atelectasis versus infiltrate.  2. Re- demonstrated minimally displaced fractures involving the  posterior and lateral aspects of the left 5th through 9th ribs.  Electronically Signed  By: Sandi Mariscal M.D.  On: 08/29/2013 07:39   Physical Exam General appearance: alert and mild distress Resp: clear to auscultation bilaterally Cardio: irregularly  irregular rhythm GI: normal findings: bowel sounds normal and soft, non-tender   Assessment/Plan: Fall Multiple left rib fxs -- Aggressive pulmonary toilet. D/C PCA, maximize non-narcotic analgesia. PT/OT. Multiple medical problems -- Home meds except coumadin FEN -- SL IV VTE -- SCD's Dispo -- Continue ICU today for aggressive pulmonary toilet    Lisette Abu, PA-C Pager: [phone] General Trauma PA Pager: [phone]  08/29/2013

## 2013-08-30 ENCOUNTER — Inpatient Hospital Stay (HOSPITAL_COMMUNITY): Payer: Medicare Other

## 2013-08-30 DIAGNOSIS — R1310 Dysphagia, unspecified: Secondary | ICD-10-CM

## 2013-08-30 LAB — CBC
HCT: 33.5 % — ABNORMAL LOW (ref 36.0–46.0)
Hemoglobin: 11 g/dL — ABNORMAL LOW (ref 12.0–15.0)
MCH: 31.3 pg (ref 26.0–34.0)
MCHC: 32.8 g/dL (ref 30.0–36.0)
MCV: 95.4 fL (ref 78.0–100.0)
Platelets: 155 10*3/uL (ref 150–400)
RBC: 3.51 MIL/uL — ABNORMAL LOW (ref 3.87–5.11)
RDW: 14.8 % (ref 11.5–15.5)
WBC: 11.4 10*3/uL — ABNORMAL HIGH (ref 4.0–10.5)

## 2013-08-30 LAB — BASIC METABOLIC PANEL
Anion gap: 10 (ref 5–15)
BUN: 18 mg/dL (ref 6–23)
CO2: 25 mEq/L (ref 19–32)
Calcium: 9 mg/dL (ref 8.4–10.5)
Chloride: 101 mEq/L (ref 96–112)
Creatinine, Ser: 0.86 mg/dL (ref 0.50–1.10)
GFR calc Af Amer: 66 mL/min — ABNORMAL LOW (ref >90)
GFR calc non Af Amer: 57 mL/min — ABNORMAL LOW (ref >90)
Glucose, Bld: 141 mg/dL — ABNORMAL HIGH (ref 70–99)
Potassium: 4.2 mEq/L (ref 3.7–5.3)
Sodium: 136 mEq/L — ABNORMAL LOW (ref 137–147)

## 2013-08-30 LAB — PROTIME-INR
INR: 1.55 — ABNORMAL HIGH (ref 0.00–1.49)
Prothrombin Time: 18.6 seconds — ABNORMAL HIGH (ref 11.6–15.2)

## 2013-08-30 MED ORDER — IPRATROPIUM-ALBUTEROL 0.5-2.5 (3) MG/3ML IN SOLN
3.0000 mL | Freq: Four times a day (QID) | RESPIRATORY_TRACT | Status: DC
  Administered 2013-08-30: 3 mL via RESPIRATORY_TRACT
  Filled 2013-08-30: qty 3

## 2013-08-30 MED ORDER — IPRATROPIUM-ALBUTEROL 0.5-2.5 (3) MG/3ML IN SOLN: 3.0000 mL | RESPIRATORY_TRACT | Status: DC | PRN

## 2013-08-30 MED ORDER — POTASSIUM CHLORIDE 2 MEQ/ML IV SOLN
INTRAVENOUS | Status: DC
  Administered 2013-08-30 – 2013-09-02 (×2): via INTRAVENOUS
  Filled 2013-08-30 (×5): qty 1000

## 2013-08-30 NOTE — Clinical Social Work Placement (Addendum)
    Clinical Social Work Department CLINICAL SOCIAL WORK PLACEMENT NOTE 08/30/2013  Patient:  Vanessa [name], Vanessa [name]  Account Number:  [id number] Admit date:  08/28/2013  Clinical Social Worker:  Butch Penny CROWDER, LCSWA  Date/time:  08/30/2013 10:05 PM  Clinical Social Work is seeking post-discharge placement for this patient at the following level of care:      (*CSW will update this form in Epic as items are completed)   08/30/2013  Patient/family provided with [place] Department of Clinical Social Work's list of facilities offering this level of care within the geographic area requested by the patient (or if unable, by the patient's family).  08/30/2013  Patient/family informed of their freedom to choose among providers that offer the needed level of care, that participate in Medicare, Medicaid or managed care program needed by the patient, have an available bed and are willing to accept the patient.    Patient/family informed of [facility]' ownership interest in [facility], as well as of the fact that they are under no obligation to receive care at this facility.  PASARR submitted to EDS on  PASARR number received on   FL2 transmitted to all facilities in geographic area requested by pt/family on  08/31/13 FL2 transmitted to all facilities within larger geographic area on   Patient informed that his/her managed care company has contracts with or will negotiate with  certain facilities, including the following:     Patient/family informed of bed offers received:  08/31/13 - 09/03/13 ([facility] [place], [place], [place]) Patient chooses bed at [facility] 09/03/13 [facility] [place], [place], [place]) Physician recommends and patient chooses bed at    Patient to be transferred to [facility] on  09/03/13 [facility] [place], [place], [place]) Patient to be transferred to facility by [facility] 09/03/13 [facility] [place], [place], [phone]) Patient and family notified of transfer on 09/03/13(Latondra Gebhart  [place], [place], [phone]) Name of family member notified:  Emree Locicero 09/03/13 [facility] [place], [place], [phone])  The following physician request were entered in Epic:   Additional Comments:   CSW called for [place],  [place] signing off.  Vanessa [name], Vanessa [name] Social Worker [phone]

## 2013-08-30 NOTE — Evaluation (Signed)
Clinical/Bedside Swallow Evaluation Patient Details  Name: Vanessa [name] MRN: [id number] Date of Birth: 07/17/1920  Today's Date: 08/30/2013 Time: 0952-1020 SLP Time Calculation (min): 28 min  Past Medical History:  Past Medical History  Diagnosis Date  . Atrial fibrillation   . Neuropathy     PERIPHERAL  . Carotid artery occlusion     LEFT  . Scoliosis   . Hoarseness   . Headache(784.0)   . Dizziness   . Abdominal bloating   . Bruises easily   . Pruritus   . Hypercholesterolemia   . Fall at home Sept 2013, Dec. 2013  Jun 08, 2012  . Varicose veins    Past Surgical History:  Past Surgical History  Procedure Laterality Date  . Gallbladder surgery    . Abdominal hysterectomy  1954  . Spine surgery  1997  . Cholecystectomy  1997    GalL Bladder   HPI:  80 year old female status post fall with multiple left rib fractures including ribs 5 through 9. Pt started on a diet after admit, but observed to choke with a piece of fruit. Pt has a history of dysphagia with OP MBS on 12/23/11 documenting normal oropharyngeal swallow, but with a moderate cervical esophageal dysphagia, with a prominent cricopharyngeus, and a small Zenker's diverticulm below the UES. Pt recommended to consume a dys 3 diet/thin liquids and meds crushed in puree. Other PMH: Nonspecific esophageal motility disorder; HTN; Neuropathy, atipical chest pain, cervical spondylosiswith anterior subluxation of C3on C4, and C4 on C5 Previous Swallow Assessment: UGI with Dr. Cristina Gong 07/26/2007: Non-specific esophageal motility d/o; Small Zenker's Diverticulum' small diverticulum near the GE juncture; Probable narrowing of the distal esophagus at the [place]- juntion; spondylosis.   Assessment / Plan / Recommendation Clinical Impression  Vanessa [name] shows evidence of a moderate esophageal dysphagia consistent with hx. Pt exhibits an intermittently wet voice and multiple swallows/ throat clearing indicating probable backflow from  the cervical esophagus. Pt has multiple broken ribs resulting in a decreased ability to cough and protect her airway. Pt is medically deconditioned due to injury. Furthermore, pt unable to masticate solids sufficiently due dental abscess and inability to wear dentures putting pt at a moderate to high risk of aspiration with solid textures.  Pt was recommended for a dysphagia 1(puree) diet with thin liquids until respiratory function and ability to masticate improve. SLP educated pt about clinical reasoning and precautions. Recommend dietary checks to ensure adherence and tolerance as well as to assess the  appropriateness of texture progression.       Aspiration Risk  Mild    Diet Recommendation Dysphagia 1 (Puree);Thin liquid   Liquid Administration via: Cup;Straw Medication Administration: Crushed with puree Supervision: Patient able to self feed;Intermittent supervision to cue for compensatory strategies Postural Changes and/or Swallow Maneuvers: Seated upright 90 degrees;Out of bed for meals    Other  Recommendations Oral Care Recommendations: Oral care Q4 per protocol   Follow Up Recommendations  24 hour supervision/assistance    Frequency and Duration min 2x/week  1 week   Pertinent Vitals/Pain rib pain    SLP Swallow Goals     Swallow Study Prior Functional Status       General HPI: 80 year old female status post fall with multiple left rib fractures including ribs 5 through 9. Pt started on a diet after admit, but observed to choke with a piece of fruit. Pt has a history of dysphagia with OP MBS on 12/23/11 documenting normal oropharyngeal swallow, but with a moderate cervical  esophageal dysphagia, with a prominent cricopharyngeus, and a small Zinkker's diverticulm below the UES. Pt recommended to consume a dys 3 diet/thin liquids and meds crushed in puree. Other PMH: Nonspecific esophageal motility disorder; HTN; Neuropathy, atipical chest pain, cervical spondylosiswith  anterior subluxation of C3on C4, and C4 on C5 Previous Swallow Assessment: UGI with Dr. Cristina Gong 07/26/2007: Non-specific esophageal motility d/o; Small Zenker's Diverticulum' small diverticulum near the GE juncture; Probable narrowing of the distal esophagus at the [place]- juntion; spondylosis. Type of Study: Bedside swallow evaluation Previous Swallow Assessment: see HPI Diet Prior to this Study: Dysphagia 3 (soft);Thin liquids Temperature Spikes Noted: No Respiratory Status: Nasal cannula Behavior/Cognition: Alert;Cooperative;Pleasant mood Oral Cavity - Dentition: Dentures, not available (bottom partial ) Self-Feeding Abilities: Able to feed self Patient Positioning: Upright in bed Baseline Vocal Quality: Low vocal intensity;Clear Volitional Cough: Weak (impaired due to broken ribs) Volitional Swallow: Able to elicit    Oral/Motor/Sensory Function Overall Oral Motor/Sensory Function: Appears within functional limits for tasks assessed Labial ROM: Within Functional Limits Labial Symmetry: Abnormal symmetry left (slight droop) Labial Strength: Within Functional Limits Labial Sensation: Within Functional Limits Lingual ROM: Within Functional Limits Lingual Symmetry: Within Functional Limits Lingual Strength: Within Functional Limits Lingual Sensation: Within Functional Limits Facial ROM: Within Functional Limits Facial Symmetry: Within Functional Limits Facial Strength: Within Functional Limits Facial Sensation: Within Functional Limits Velum: Within Functional Limits Mandible: Within Functional Limits   Ice Chips     Thin Liquid Thin Liquid: Impaired Presentation: Straw Pharyngeal  Phase Impairments: Wet Vocal Quality;Throat Clearing - Delayed;Multiple swallows    Nectar Thick Nectar Thick Liquid: Not tested   Honey Thick Honey Thick Liquid: Not tested   Puree Puree: Impaired Presentation: Self Fed;Spoon Pharyngeal Phase Impairments: Multiple swallows;Throat Clearing - Delayed    Solid   GO    Solid: Not tested       Eden Emms 08/30/2013,11:42 AM

## 2013-08-30 NOTE — Progress Notes (Signed)
Occupational Therapy Evaluation Patient Details Name: Vanessa [name] MRN: [id number] DOB: [birth year] Today's Date: 08/30/2013    History of Present Illness 80 yo s/p fall with resulting L 5-9 rib fractures   Clinical Impression   PTA, pt lived alone and was independent with ADL and mod I with mobility @ RW/cane level. Pt had caregiver assistance if needed, but still drove and did her own cooking. Pt states that she has had difficulty with her balance and last fall was 5/15. Pt presents with deficits listed below and will need rehab at SNF to facilitate return home @ mod I level. Pt will benefit from skilled OT services to facilitate D/C to next venue due to below deficits. Pt in agreement to SNF and would like to look into [facility] as she has talked to them about living at their facility.    Follow Up Recommendations  SNF;Supervision/Assistance - 24 hour    Equipment Recommendations  3 in 1 bedside comode    Recommendations for Other Services       Precautions / Restrictions Precautions Precautions: Fall      Mobility Bed Mobility Overal bed mobility: Needs Assistance Bed Mobility: Supine to Sit     Supine to sit: Max assist;HOB elevated     General bed mobility comments: encouraged holding pillow/splinting L side for pain control  Transfers Overall transfer level: Needs assistance Equipment used: Rolling walker (2 wheeled) Transfers: Sit to/from [facility] Sit to Stand: Mod assist;+2 physical assistance Stand pivot transfers: Mod assist;+2 physical assistance       General transfer comment: significant posterior lean during mobility    Balance Overall balance assessment: Needs assistance Sitting-balance support: Feet supported;Bilateral upper extremity supported Sitting balance-Leahy Scale: Poor   Postural control: Posterior lean Standing balance support: During functional activity;Bilateral upper extremity supported Standing  balance-Leahy Scale: Poor Standing balance comment: posterior lean                            ADL Overall ADL's : Needs assistance/impaired Eating/Feeding: Set up;Supervision/ safety;Sitting (dysphagia 1 diet)   Grooming: Minimal assistance;Sitting   Upper Body Bathing: Minimal assitance;Sitting   Lower Body Bathing: Moderate assistance;Sit to/from stand   Upper Body Dressing : Minimal assistance;Sitting   Lower Body Dressing: Moderate assistance;Sit to/from stand   Toilet Transfer: Moderate assistance;+2 for physical assistance   Toileting- Clothing Manipulation and Hygiene: Moderate assistance;Sit to/from stand       Functional mobility during ADLs: Moderate assistance;+2 for physical assistance General ADL Comments: limited by pain and apparent balance deficits     Vision                     Perception     Praxis      Pertinent Vitals/Pain Pain Assessment: 0-10 Pain Score: 5  Pain Location: L side Pain Descriptors / Indicators: Aching Pain Intervention(s): Monitored during session;Limited activity within patient's tolerance;Repositioned     Hand Dominance     Extremity/Trunk Assessment Upper Extremity Assessment Upper Extremity Assessment: Generalized weakness   Lower Extremity Assessment Lower Extremity Assessment: Generalized weakness   Cervical / Trunk Assessment Cervical / Trunk Assessment: Kyphotic;Other exceptions (accomodating for rib pain)   Communication Communication Communication: HOH (wears glasses for distance)   Cognition Arousal/Alertness: Awake/alert Behavior During Therapy: WFL for tasks assessed/performed Overall Cognitive Status: Within Functional Limits for tasks assessed       Memory: Decreased short-term memory (most likely STM  deficits at [place])             General Comments       Exercises       Shoulder Instructions      Home Living Family/patient expects to be discharged to:: Skilled  nursing facility Living Arrangements: Alone Available Help at Discharge: Available 24 hours/day;Personal care attendant Type of Home: House Home Access: Stairs to enter [employer] of Steps: 2 Entrance Stairs-Rails: Can reach both Home Layout: Two level Alternate Level Stairs-Number of Steps: 12             Home Equipment: Clinical cytogeneticist - 2 wheels;Cane - single point (chair lift)          Prior Functioning/Environment Level of Independence: Independent with assistive device(s)        Comments: walker at night, cane for community (has assistance as needed)    OT Diagnosis: Generalized weakness;Acute pain   OT Problem List: Decreased strength;Decreased activity tolerance;Impaired balance (sitting and/or standing);Decreased safety awareness;Decreased knowledge of use of DME or AE;Decreased knowledge of precautions;Cardiopulmonary status limiting activity;Pain   OT Treatment/Interventions: Self-care/ADL training;Therapeutic exercise;Energy conservation;DME and/or AE instruction;Therapeutic activities;Patient/family education;Balance training    OT Goals(Current goals can be found in the care plan section) Acute Rehab OT Goals Patient Stated Goal: to be able to take care of myself again OT Goal Formulation: With patient Time For Goal Achievement: 09/13/13 Potential to Achieve Goals: Good  OT Frequency: Min 2X/week   Barriers to D/C:            Co-evaluation PT/OT/SLP Co-Evaluation/Treatment: Yes Reason for Co-Treatment: For patient/therapist safety          End of Session Equipment Utilized During Treatment: Rolling walker;Oxygen Nurse Communication: Mobility status  Activity Tolerance: Patient limited by pain Patient left: in chair;with call bell/phone within reach;with family/visitor present   Time: 0093-8182 OT Time Calculation (min): 31 min Charges:  OT General Charges $OT Visit: 1 Procedure OT Evaluation $Initial OT Evaluation Tier I: 1  Procedure OT Treatments $Self Care/Home Management : 8-22 mins G-Codes:    Mry Lamia,HILLARY 2013/09/09, 12:58 PM   [facility], OTR/L  [phone] Sep 09, 2013

## 2013-08-30 NOTE — Progress Notes (Addendum)
Patient ID: Vanessa [name], female   DOB: 02-02-1920, 80 y.o.   MRN: [id number]    Subjective: Sore L ribs, has chronic difficulty swallowing  Objective: Vital signs in last 24 hours: Temp:  [97.2 F (36.2 C)-98.2 F (36.8 C)] 97.2 F (36.2 C) (08/20 0757) Pulse Rate:  [46-88] 46 (08/20 0700) Resp:  [10-22] 11 (08/20 0700) BP: (79-116)/(35-79) 97/49 mmHg (08/20 0700) SpO2:  [87 %-100 %] 100 % (08/20 0700) Last BM Date: 08/28/13  Intake/Output from previous day: 08/19 0701 - 08/20 0700 In: 316.3 [P.O.:160; I.V.:106.3; IV Piggyback:50] Out: 75 [Urine:75] Intake/Output this shift:    General appearance: alert and cooperative Resp: clear to auscultation bilaterally and decreased at bases Chest wall: left sided chest wall tenderness Cardio: regular rate and rhythm GI: soft, NT, active BS Extremities: calves soft IS < 500  Lab Results: CBC   Recent Labs  08/29/13 0223 08/30/13 0250  WBC 13.3* 11.4*  HGB 12.3 11.0*  HCT 37.5 33.5*  PLT 191 155   BMET  Recent Labs  08/29/13 0223 08/30/13 0250  NA 138 136*  K 4.7 4.2  CL 101 101  CO2 27 25  GLUCOSE 144* 141*  BUN 17 18  CREATININE 0.85 0.86  CALCIUM 9.3 9.0   PT/INR  Recent Labs  08/29/13 0223 08/30/13 0250  LABPROT 25.6* 18.6*  INR 2.33* 1.55*   ABG No results found for this basename: PHART, PCO2, PO2, HCO3,  in the last 72 hours  Studies/Results: Dg Ribs Unilateral W/chest Left  08/28/2013   CLINICAL DATA:  [place] today at after losing her balance, LEFT flank pain  EXAM: LEFT RIBS AND CHEST - 3+ VIEW  COMPARISON:  Chest radiographs 06/09/2012  FINDINGS: Enlargement of cardiac silhouette.  Mild tortuosity of thoracic aorta.  Mediastinal contours and pulmonary vascularity otherwise normal.  Chronic bronchitic changes with minimal LEFT basilar atelectasis.  No acute infiltrate, pleural effusion or pneumothorax.  Diffuse osseous demineralization.  BB placed at site of symptoms lower lateral LEFT chest.   Multiple LEFT rib fractures identified at the LEFT fifth, sixth, seventh, eighth and ninth ribs, lateral to posterolateral in location.  Degenerative disc disease changes and scoliosis of the thoracolumbar spine with evidence of prior lumbar spinal fixation.  Superimposed costal cartilaginous calcifications identified.  IMPRESSION: Chronic bronchitic changes and minimal LEFT basilar atelectasis.  Displaced fractures of the LEFT fifth through ninth ribs as above.   Electronically Signed   By: Lavonia Dana M.D.   On: 08/28/2013 16:54   Dg Chest Port 1 View  08/30/2013   CLINICAL DATA:  Rib fractures.  EXAM: PORTABLE CHEST - 1 VIEW  COMPARISON:  Single view of the chest 08/29/2013.  FINDINGS: Moderately large bilateral pleural effusions, worse on the left, have increased. There is associated compressive atelectasis. No pneumothorax identified. Heart size is enlarged. Left rib fractures are again seen.  IMPRESSION: Increased left greater than right pleural effusions and atelectasis.   Electronically Signed   By: Inge Rise M.D.   On: 08/30/2013 07:51   Dg Chest Port 1 View  08/29/2013   CLINICAL DATA:  follow rib fractures  EXAM: PORTABLE CHEST - 1 VIEW  COMPARISON:  08/28/2013; 06/09/2012  FINDINGS: Grossly unchanged enlarged cardiac silhouette and mediastinal contours with atherosclerotic plaque within the thoracic aorta. The pulmonary vasculature appears less distinct on present examination with cephalization of flow. Grossly unchanged small bilateral pleural effusions. Slight worsening of bibasilar heterogeneous opacities, left greater than right. No pneumothorax. Minimally displaced fractures involving  the posterior and lateral aspects of the left 5th through 9th ribs are re- demonstrated.  IMPRESSION: 1. Findings suggestive of worsening pulmonary edema and bibasilar opacities, left greater than right, atelectasis versus infiltrate. 2. Re- demonstrated minimally displaced fractures involving the  posterior and lateral aspects of the left 5th through 9th ribs.   Electronically Signed   By: Sandi Mariscal M.D.   On: 08/29/2013 07:39    Anti-infectives: Anti-infectives   None      Assessment/Plan: Fall Multiple left rib fxs -- B effusions L>R. Aggressive pulmonary toilet. Add bronchodilators. PT/OT. Multiple medical problems -- Home meds except coumadin FEN -- add IVF as not taking in enough PO. Speech to see for chronic swallowing issue. VTE -- SCD's Dispo -- ICU as was on NRB mask overnight   LOS: 2 days    Georganna Skeans, MD, MPH, FACS Trauma: [phone] General Surgery: [phone]  08/30/2013

## 2013-08-30 NOTE — Clinical Social Work Psychosocial (Signed)
Clinical Social Work Department BRIEF PSYCHOSOCIAL ASSESSMENT 08/30/2013  Patient:  Vanessa [name],Vanessa [name]     Account Number:  [id number]     Admit date:  08/28/2013  Clinical Social Worker:  ,, LCSWA  Date/Time:  08/30/2013 04:30 PM  Referred by:  RN  Date Referred:  08/30/2013 Referred for  Other - See comment   Other Referral:   SNF vs ALF   Interview type:  Other - See comment Other interview type:   Patient and friend Sandra at bedside,  telephone call to son Ruffin    PSYCHOSOCIAL DATA Living Status:  ALONE Admitted from facility:   Level of care:   Primary support name:  Ruffin [place] (c) [phone] Primary support relationship to patient:  CHILD, ADULT Degree of support available:   Strong support    Patient also has a son in [place] who is very supportive    CURRENT CONCERNS Current Concerns  Post-Acute Placement   Other Concerns:    SOCIAL WORK ASSESSMENT / PLAN 80 year old female admitted through trauma services from home following a fall resulting in rib fractures.  CSW met with patient and with her permission- her friend and part time sitter Sandra. Patient related that she has been living alone but has been considering care options at [facility]. She states that she has had several past falls but "would prefer not to discuss them further." CSW discussed OT's recommendation for short term SNF; awaiting PT's recommendation with patient. She states that she has been in frequent contact with [facility] and has been assured that she would have an apartment there.  CSW discussed benefits of short term SNF and she was very hesitant.  She continued to talk about the apartments and the ALF.  During the visit- patient's friend contacted her son Ruffin via telephone.  He stated that all arrangements have been made for patient to go into the ALF unit at [facility]. CSW later spoke with patient about this and it was confirmed.  Also discussed OT's  recommendation for SNF and probaby PT's recommendation for same.  Son is "open" to this idea but is doubtful that his mother will agree.  CSW will need to discuss with admissions at [facility] tomorrow as well as await PT's recommendation.  Patient and son were agreeable with this. Patient relates that she wants to go to AL at [facility] and will have hired helped and home health as needed. She plans to eventually go into independent living at facility.  Fl2 initiated and will complete once final level of care has been determined. SBIRT completed and documented.   Assessment/plan status:  Psychosocial Support/Ongoing Assessment of Needs Other assessment/ plan:   Information/referral to community resources:   SNF list left for patient to review and consider    PATIENTS/FAMILYS RESPONSE TO PLAN OF CARE: Patient is alert, oriented and very pleasant. She stated that she has had extensive head and rib pain since she was admitted but has requested to cut back on the pain medication as she feels that it makes her more confused. "I don't like that feeling".  Patient was very talkative and is very proud of her past level of independence.  She only wants to be placed at [facility]. Her son is very supportive of whatever level of care she requires.        

## 2013-08-30 NOTE — Evaluation (Signed)
Reviewed by Herbie Baltimore, [place] CCC-SLP [phone]

## 2013-08-30 NOTE — Progress Notes (Signed)
CSW met briefly with patient and per her request- her friend and part time sitter Katharine Look this afternoon. After long discussion and call to her son Novella Rob- it appears that the family has arranged for placement at [facility] in their ALF unit.  OT is currently recommending SNF and awaiting PT eval/note.  Patient remains adamant that the ALF is the only arrangement that she wants. Son is to call CSW when he gets to the hospital and will need to follow up with admissions at [facility] tomorrow.  SW Psychosocial Assessment to follow.  Fl2 has been initiated.  Lorie Phenix. Pauline Good, [place]

## 2013-08-31 ENCOUNTER — Inpatient Hospital Stay (HOSPITAL_COMMUNITY): Payer: Medicare Other

## 2013-08-31 NOTE — Clinical Social Work Note (Signed)
CSW spoke with Novella Rob, son, who confirms choice of [place], SNF.  CSW spoke with Claiborne Billings at [facility] who tentative has offered a bed pending review (electronic system down- clinicals hardfaxed).  CSW has arranged possible weekend discharge with SNF pending medical stability and MD order.    Nonnie Done, [place] [phone]  Clinical Social Work

## 2013-08-31 NOTE — Progress Notes (Signed)
Pt has no IV access.I called IV to attempt restart. IV team said they had stuck her multiple  every day and that the IV blows soon after. I will discuss with team at rounds.

## 2013-08-31 NOTE — Progress Notes (Signed)
Trauma Service Note  Subjective: Patient is awake and alert.  For the most part seems to be very oriented.  Objective: Vital signs in last 24 hours: Temp:  [97.6 F (36.4 C)-98.9 F (37.2 C)] 97.9 F (36.6 C) (08/21 0800) Pulse Rate:  [70-104] 104 (08/21 0800) Resp:  [9-23] 19 (08/21 0800) BP: (72-143)/(34-123) 97/50 mmHg (08/21 0800) SpO2:  [89 %-100 %] 99 % (08/21 0800) Last BM Date: 08/28/13  Intake/Output from previous day: 08/20 0701 - 08/21 0700 In: 337.5 [P.O.:30; I.V.:307.5] Out: 300 [Urine:300] Intake/Output this shift:    General: No acute distress.  Eating breakfast.  Lungs: Clear.  Sats are okay with 2L being delivered.  Abd: Benign  Extremities: No changes  Neuro: Intact  Lab Results: CBC   Recent Labs  08/29/13 0223 08/30/13 0250  WBC 13.3* 11.4*  HGB 12.3 11.0*  HCT 37.5 33.5*  PLT 191 155   BMET  Recent Labs  08/29/13 0223 08/30/13 0250  NA 138 136*  K 4.7 4.2  CL 101 101  CO2 27 25  GLUCOSE 144* 141*  BUN 17 18  CREATININE 0.85 0.86  CALCIUM 9.3 9.0   PT/INR  Recent Labs  08/29/13 0223 08/30/13 0250  LABPROT 25.6* 18.6*  INR 2.33* 1.55*   ABG No results found for this basename: PHART, PCO2, PO2, HCO3,  in the last 72 hours  Studies/Results: Dg Chest Port 1 View  08/31/2013   CLINICAL DATA:  Pleural effusion.  Left rib fractures.  EXAM: PORTABLE CHEST - 1 VIEW  COMPARISON:  08/30/2013.  FINDINGS: Mediastinum hilar structures normal. Cardiomegaly with pulmonary vascular prominence, interstitial prominence and bilateral pleural effusions again noted. These findings consistent with congestive heart failure. There has been progression of pulmonary edema on today's exam. No pneumothorax. Multiple left lower rib fractures are again noted.  IMPRESSION: 1. Findings consistent with congestive heart failure with pulmonary edema and bilateral pleural effusions. Pulmonary edema has increased slightly on today's exam .  2.  Stable left  lower rib fractures.  No pneumothorax.   Electronically Signed   By: Marcello Moores  Register   On: 08/31/2013 07:54   Dg Chest Port 1 View  08/30/2013   CLINICAL DATA:  Rib fractures.  EXAM: PORTABLE CHEST - 1 VIEW  COMPARISON:  Single view of the chest 08/29/2013.  FINDINGS: Moderately large bilateral pleural effusions, worse on the left, have increased. There is associated compressive atelectasis. No pneumothorax identified. Heart size is enlarged. Left rib fractures are again seen.  IMPRESSION: Increased left greater than right pleural effusions and atelectasis.   Electronically Signed   By: Inge Rise M.D.   On: 08/30/2013 07:51    Anti-infectives: Anti-infectives   None      Assessment/Plan: s/p  Transfer to floor. DC IV  LOS: 3 days   Kathryne Eriksson. Dahlia Bailiff, MD, FACS [phone] Trauma Surgeon 08/31/2013

## 2013-08-31 NOTE — Progress Notes (Signed)
Pt transferred by NT to 6N bed 14. LM with son Novella Rob with room #. Report called to [facility] RN. Personal items including dentures and wallet transferred with pt.

## 2013-09-01 MED ORDER — DOCUSATE SODIUM 50 MG/5ML PO LIQD
100.0000 mg | Freq: Two times a day (BID) | ORAL | Status: DC
  Administered 2013-09-01 – 2013-09-03 (×4): 100 mg via ORAL
  Filled 2013-09-01 (×8): qty 10

## 2013-09-01 MED ORDER — ACETAMINOPHEN 160 MG/5ML PO SOLN
650.0000 mg | Freq: Four times a day (QID) | ORAL | Status: DC | PRN
  Administered 2013-09-01 – 2013-09-03 (×6): 650 mg via ORAL
  Filled 2013-09-01 (×6): qty 20.3

## 2013-09-01 NOTE — Telephone Encounter (Signed)
I will review forms that need to be completed and find out whta her question is. Pt is in hospital as of 08/28/2013.

## 2013-09-01 NOTE — Progress Notes (Signed)
  Subjective: Reporting rib pain Denies SOB  Objective: Vital signs in last 24 hours: Temp:  [97.9 F (36.6 C)-98.3 F (36.8 C)] 98.1 F (36.7 C) (08/22 0639) Pulse Rate:  [92-104] 93 (08/22 0639) Resp:  [15-19] 16 (08/22 0639) BP: (97-151)/(43-86) 151/75 mmHg (08/22 0639) SpO2:  [97 %-99 %] 97 % (08/22 0639) Last BM Date: 08/28/13  Intake/Output from previous day: 08/21 0701 - 08/22 0700 In: 200 [P.O.:200] Out: -  Intake/Output this shift:    Lungs mostly clear with decrease in left base cv IRR  Lab Results:   Recent Labs  08/30/13 0250  WBC 11.4*  HGB 11.0*  HCT 33.5*  PLT 155   BMET  Recent Labs  08/30/13 0250  NA 136*  K 4.2  CL 101  CO2 25  GLUCOSE 141*  BUN 18  CREATININE 0.86  CALCIUM 9.0   PT/INR  Recent Labs  08/30/13 0250  LABPROT 18.6*  INR 1.55*   ABG No results found for this basename: PHART, PCO2, PO2, HCO3,  in the last 72 hours  Studies/Results: Dg Chest Port 1 View  08/31/2013   CLINICAL DATA:  Pleural effusion.  Left rib fractures.  EXAM: PORTABLE CHEST - 1 VIEW  COMPARISON:  08/30/2013.  FINDINGS: Mediastinum hilar structures normal. Cardiomegaly with pulmonary vascular prominence, interstitial prominence and bilateral pleural effusions again noted. These findings consistent with congestive heart failure. There has been progression of pulmonary edema on today's exam. No pneumothorax. Multiple left lower rib fractures are again noted.  IMPRESSION: 1. Findings consistent with congestive heart failure with pulmonary edema and bilateral pleural effusions. Pulmonary edema has increased slightly on today's exam .  2.  Stable left lower rib fractures.  No pneumothorax.   Electronically Signed   By: Marcello Moores  Register   On: 08/31/2013 07:54    Anti-infectives: Anti-infectives   None      Assessment/Plan: S/p fall with multiple left rib fractures  Continue pain control and pulmonary toilet PT Repeat CXR in the am  LOS: 4 days     Daylah Sayavong A 09/01/2013

## 2013-09-02 ENCOUNTER — Inpatient Hospital Stay (HOSPITAL_COMMUNITY): Payer: Medicare Other

## 2013-09-02 LAB — GLUCOSE, CAPILLARY: Glucose-Capillary: 123 mg/dL — ABNORMAL HIGH (ref 70–99)

## 2013-09-02 MED ORDER — WARFARIN - PHARMACIST DOSING INPATIENT
Freq: Every day | Status: DC
  Administered 2013-09-02: 4

## 2013-09-02 MED ORDER — WARFARIN SODIUM 4 MG PO TABS
4.0000 mg | ORAL_TABLET | Freq: Once | ORAL | Status: AC
  Administered 2013-09-02: 4 mg via ORAL
  Filled 2013-09-02: qty 1

## 2013-09-02 NOTE — Progress Notes (Signed)
ANTICOAGULATION CONSULT NOTE - Initial Consult  Pharmacy Consult for warfarin Indication: atrial fibrillation  Allergies  Allergen Reactions  . Tape Other (See Comments)    Tears skin. Band-Aids    Patient Measurements: Height: 5\' 2"  (157.5 cm) Weight: 117 lb 4.6 oz (53.2 kg) IBW/kg (Calculated) : 50.1  Vital Signs: Temp: 98.1 F (36.7 C) (08/23 0631) Temp src: Oral (08/23 0631) BP: 142/83 mmHg (08/23 0631) Pulse Rate: 93 (08/23 0631)  Labs: No results found for this basename: HGB, HCT, PLT, APTT, LABPROT, INR, HEPARINUNFRC, CREATININE, CKTOTAL, CKMB, TROPONINI,  in the last 72 hours  Estimated Creatinine Clearance: 33.7 ml/min (by C-G formula based on Cr of 0.86).   Medical History: Past Medical History  Diagnosis Date  . Atrial fibrillation   . Neuropathy     PERIPHERAL  . Carotid artery occlusion     LEFT  . Scoliosis   . Hoarseness   . Headache(784.0)   . Dizziness   . Abdominal bloating   . Bruises easily   . Pruritus   . Hypercholesterolemia   . Fall at home Sept 2013, Dec. 2013  Jun 08, 2012  . Varicose veins     Medications:  Warfarin 2mg  daily except 4mg  on Sundays  Assessment: 29 yof on chronic coumadin for afib, presented to the hospital s/p fall resulting in a hemothorax. Coumadin had been on hold but will be resumed today. Last INR was 1.55 on 8/20. H/H was 11/33.5 and ptls were 155 at that time. No overt bleeding noted. Pt received vitamin K 5mg  IV on 8/19.   Of note, pt has been started on naproxen during her hospitalization. The combination of NSAIDs + warfarin may increase the risk of bleeding.   Goal of Therapy:  INR 2-3   Plan:  1. Warfarin 4mg  PO x 1 tonight 2. Daily INR 3. CBC in AM 4. Consider DC naproxen  Tinika Bucknam, Rande Lawman 09/02/2013,8:54 AM

## 2013-09-02 NOTE — Progress Notes (Signed)
  Subjective: Complains of nausea Minimal SOB, but still with considerable rib pain  Objective: Vital signs in last 24 hours: Temp:  [97.7 F (36.5 C)-98.8 F (37.1 C)] 98.1 F (36.7 C) (08/23 0631) Pulse Rate:  [79-105] 93 (08/23 0631) Resp:  [16-19] 19 (08/23 0631) BP: (114-146)/(66-92) 142/83 mmHg (08/23 0631) SpO2:  [96 %-97 %] 96 % (08/23 0631) Last BM Date: 08/28/13  Intake/Output from previous day:   Intake/Output this shift:    Lungs mostly clear except at bases Not in resp distress CV tachy  Lab Results:  No results found for this basename: WBC, HGB, HCT, PLT,  in the last 72 hours BMET No results found for this basename: NA, K, CL, CO2, GLUCOSE, BUN, CREATININE, CALCIUM,  in the last 72 hours PT/INR No results found for this basename: LABPROT, INR,  in the last 72 hours ABG No results found for this basename: PHART, PCO2, PO2, HCO3,  in the last 72 hours  Studies/Results: No results found.  Anti-infectives: Anti-infectives   None      Assessment/Plan:  S/p fall with multiple left rib fractures  Continue pain control and pulmonary toilet Suspect she will need placement eventually Resume coumadin  LOS: 5 days    Vanessa [name] 09/02/2013

## 2013-09-03 DIAGNOSIS — Z9181 History of falling: Secondary | ICD-10-CM | POA: Diagnosis not present

## 2013-09-03 DIAGNOSIS — G9349 Other encephalopathy: Secondary | ICD-10-CM | POA: Diagnosis not present

## 2013-09-03 DIAGNOSIS — E785 Hyperlipidemia, unspecified: Secondary | ICD-10-CM | POA: Diagnosis present

## 2013-09-03 DIAGNOSIS — Z7901 Long term (current) use of anticoagulants: Secondary | ICD-10-CM | POA: Diagnosis not present

## 2013-09-03 DIAGNOSIS — T7589XS Other specified effects of external causes, sequela: Secondary | ICD-10-CM | POA: Diagnosis not present

## 2013-09-03 DIAGNOSIS — IMO0001 Reserved for inherently not codable concepts without codable children: Secondary | ICD-10-CM | POA: Diagnosis not present

## 2013-09-03 DIAGNOSIS — M1990 Unspecified osteoarthritis, unspecified site: Secondary | ICD-10-CM | POA: Diagnosis not present

## 2013-09-03 DIAGNOSIS — T45515A Adverse effect of anticoagulants, initial encounter: Secondary | ICD-10-CM | POA: Diagnosis present

## 2013-09-03 DIAGNOSIS — K922 Gastrointestinal hemorrhage, unspecified: Secondary | ICD-10-CM | POA: Diagnosis not present

## 2013-09-03 DIAGNOSIS — N83209 Unspecified ovarian cyst, unspecified side: Secondary | ICD-10-CM | POA: Diagnosis present

## 2013-09-03 DIAGNOSIS — K2970 Gastritis, unspecified, without bleeding: Secondary | ICD-10-CM | POA: Diagnosis not present

## 2013-09-03 DIAGNOSIS — J189 Pneumonia, unspecified organism: Secondary | ICD-10-CM | POA: Diagnosis present

## 2013-09-03 DIAGNOSIS — I369 Nonrheumatic tricuspid valve disorder, unspecified: Secondary | ICD-10-CM | POA: Diagnosis not present

## 2013-09-03 DIAGNOSIS — R52 Pain, unspecified: Secondary | ICD-10-CM | POA: Diagnosis not present

## 2013-09-03 DIAGNOSIS — I6529 Occlusion and stenosis of unspecified carotid artery: Secondary | ICD-10-CM | POA: Diagnosis present

## 2013-09-03 DIAGNOSIS — Z515 Encounter for palliative care: Secondary | ICD-10-CM | POA: Diagnosis not present

## 2013-09-03 DIAGNOSIS — J90 Pleural effusion, not elsewhere classified: Secondary | ICD-10-CM | POA: Diagnosis present

## 2013-09-03 DIAGNOSIS — J9819 Other pulmonary collapse: Secondary | ICD-10-CM | POA: Diagnosis not present

## 2013-09-03 DIAGNOSIS — R0789 Other chest pain: Secondary | ICD-10-CM | POA: Diagnosis present

## 2013-09-03 DIAGNOSIS — G609 Hereditary and idiopathic neuropathy, unspecified: Secondary | ICD-10-CM | POA: Diagnosis present

## 2013-09-03 DIAGNOSIS — R110 Nausea: Secondary | ICD-10-CM | POA: Diagnosis not present

## 2013-09-03 DIAGNOSIS — E7800 Pure hypercholesterolemia, unspecified: Secondary | ICD-10-CM | POA: Diagnosis not present

## 2013-09-03 DIAGNOSIS — Z5189 Encounter for other specified aftercare: Secondary | ICD-10-CM | POA: Diagnosis not present

## 2013-09-03 DIAGNOSIS — R6510 Systemic inflammatory response syndrome (SIRS) of non-infectious origin without acute organ dysfunction: Secondary | ICD-10-CM | POA: Diagnosis present

## 2013-09-03 DIAGNOSIS — D72829 Elevated white blood cell count, unspecified: Secondary | ICD-10-CM | POA: Diagnosis present

## 2013-09-03 DIAGNOSIS — R079 Chest pain, unspecified: Secondary | ICD-10-CM | POA: Diagnosis not present

## 2013-09-03 DIAGNOSIS — M4120 Other idiopathic scoliosis, site unspecified: Secondary | ICD-10-CM | POA: Diagnosis not present

## 2013-09-03 DIAGNOSIS — S2239XA Fracture of one rib, unspecified side, initial encounter for closed fracture: Secondary | ICD-10-CM | POA: Diagnosis not present

## 2013-09-03 DIAGNOSIS — R10819 Abdominal tenderness, unspecified site: Secondary | ICD-10-CM | POA: Diagnosis not present

## 2013-09-03 DIAGNOSIS — I9589 Other hypotension: Secondary | ICD-10-CM | POA: Diagnosis not present

## 2013-09-03 DIAGNOSIS — D62 Acute posthemorrhagic anemia: Secondary | ICD-10-CM | POA: Diagnosis not present

## 2013-09-03 DIAGNOSIS — I209 Angina pectoris, unspecified: Secondary | ICD-10-CM | POA: Diagnosis not present

## 2013-09-03 DIAGNOSIS — R51 Headache: Secondary | ICD-10-CM | POA: Diagnosis not present

## 2013-09-03 DIAGNOSIS — Z79899 Other long term (current) drug therapy: Secondary | ICD-10-CM | POA: Diagnosis not present

## 2013-09-03 DIAGNOSIS — F411 Generalized anxiety disorder: Secondary | ICD-10-CM | POA: Diagnosis not present

## 2013-09-03 DIAGNOSIS — S2249XA Multiple fractures of ribs, unspecified side, initial encounter for closed fracture: Secondary | ICD-10-CM | POA: Diagnosis not present

## 2013-09-03 DIAGNOSIS — K921 Melena: Secondary | ICD-10-CM | POA: Diagnosis not present

## 2013-09-03 DIAGNOSIS — E8809 Other disorders of plasma-protein metabolism, not elsewhere classified: Secondary | ICD-10-CM | POA: Diagnosis present

## 2013-09-03 DIAGNOSIS — D473 Essential (hemorrhagic) thrombocythemia: Secondary | ICD-10-CM | POA: Diagnosis present

## 2013-09-03 DIAGNOSIS — I959 Hypotension, unspecified: Secondary | ICD-10-CM | POA: Diagnosis present

## 2013-09-03 DIAGNOSIS — W19XXXA Unspecified fall, initial encounter: Secondary | ICD-10-CM | POA: Diagnosis present

## 2013-09-03 DIAGNOSIS — Z452 Encounter for adjustment and management of vascular access device: Secondary | ICD-10-CM | POA: Diagnosis not present

## 2013-09-03 DIAGNOSIS — K254 Chronic or unspecified gastric ulcer with hemorrhage: Secondary | ICD-10-CM | POA: Diagnosis present

## 2013-09-03 DIAGNOSIS — T394X5A Adverse effect of antirheumatics, not elsewhere classified, initial encounter: Secondary | ICD-10-CM | POA: Diagnosis present

## 2013-09-03 DIAGNOSIS — Z66 Do not resuscitate: Secondary | ICD-10-CM | POA: Diagnosis not present

## 2013-09-03 DIAGNOSIS — K2961 Other gastritis with bleeding: Secondary | ICD-10-CM | POA: Diagnosis present

## 2013-09-03 DIAGNOSIS — D649 Anemia, unspecified: Secondary | ICD-10-CM | POA: Diagnosis not present

## 2013-09-03 DIAGNOSIS — R0682 Tachypnea, not elsewhere classified: Secondary | ICD-10-CM | POA: Diagnosis present

## 2013-09-03 DIAGNOSIS — IMO0002 Reserved for concepts with insufficient information to code with codable children: Secondary | ICD-10-CM | POA: Diagnosis not present

## 2013-09-03 DIAGNOSIS — J9600 Acute respiratory failure, unspecified whether with hypoxia or hypercapnia: Secondary | ICD-10-CM | POA: Diagnosis present

## 2013-09-03 DIAGNOSIS — I4891 Unspecified atrial fibrillation: Secondary | ICD-10-CM | POA: Diagnosis present

## 2013-09-03 LAB — PROTIME-INR
INR: 1.21 (ref 0.00–1.49)
Prothrombin Time: 15.3 seconds — ABNORMAL HIGH (ref 11.6–15.2)

## 2013-09-03 LAB — CBC
HCT: 36.5 % (ref 36.0–46.0)
Hemoglobin: 11.9 g/dL — ABNORMAL LOW (ref 12.0–15.0)
MCH: 31.1 pg (ref 26.0–34.0)
MCHC: 32.6 g/dL (ref 30.0–36.0)
MCV: 95.3 fL (ref 78.0–100.0)
Platelets: 248 10*3/uL (ref 150–400)
RBC: 3.83 MIL/uL — ABNORMAL LOW (ref 3.87–5.11)
RDW: 14.7 % (ref 11.5–15.5)
WBC: 7.6 10*3/uL (ref 4.0–10.5)

## 2013-09-03 MED ORDER — ACETAMINOPHEN 160 MG/5ML PO SOLN: 1000.0000 mg | Freq: Four times a day (QID) | ORAL | Status: DC | PRN

## 2013-09-03 MED ORDER — NAPROXEN 500 MG PO TABS: 500.0000 mg | ORAL_TABLET | Freq: Two times a day (BID) | ORAL | Status: DC

## 2013-09-03 NOTE — Progress Notes (Signed)
Physical Therapy Treatment Patient Details Name: DUBLIN CANTERO MRN: [id number] DOB: 1920-06-05 Today's Date: 09/03/2013    History of Present Illness 80 yo s/p fall with resulting L 5-9 rib fractures    PT Comments    *Pt ambulated 6' x 2 with RW and min assist for balance. Pain/fatigue limit activity tolerance. **  Follow Up Recommendations  SNF;Supervision/Assistance - 24 hour     Equipment Recommendations  Other (comment) (tbd)    Recommendations for Other Services       Precautions / Restrictions Precautions Precautions: Fall Restrictions Weight Bearing Restrictions: No    Mobility  Bed Mobility Overal bed mobility: Needs Assistance Bed Mobility: Sit to Supine       Sit to supine: Mod assist   General bed mobility comments: assist to control trunk descent and to bring BLEs into bed  Transfers Overall transfer level: Needs assistance Equipment used: Rolling walker (2 wheeled) Transfers: Sit to/from Stand Sit to Stand: Mod assist;+2 physical assistance         General transfer comment:  posterior lean in standing, pt fatigues quickly, cues for hand placement  Ambulation/Gait Ambulation/Gait assistance: Min assist Ambulation Distance (Feet): 12 Feet (6' x 2) Assistive device: Rolling walker (2 wheeled) Gait Pattern/deviations: Decreased step length - right;Decreased step length - left;Trunk flexed;Leaning posteriorly;Narrow base of support Gait velocity: decreased   General Gait Details: posterior lean requiring min A; pain limits activity tolerance, pt had been up in chair 2 hours prior to session and stated she was tired. Assisted pt to [facility], then to bed.    Stairs            Wheelchair Mobility    Modified Rankin (Stroke Patients Only)       Balance   Sitting-balance support: Feet supported Sitting balance-Leahy Scale: Fair       Standing balance-Leahy Scale: Poor Standing balance comment: posterior lean                     Cognition Arousal/Alertness: Awake/alert Behavior During Therapy: WFL for tasks assessed/performed Overall Cognitive Status: Within Functional Limits for tasks assessed                      Exercises      General Comments General comments (skin integrity, edema, etc.): bruising L flank      Pertinent Vitals/Pain Pain Score: 6  Pain Location: L side Pain Descriptors / Indicators: Sore Pain Intervention(s): Monitored during session;Repositioned;Premedicated before session;Patient requesting pain meds-RN notified    Home Living                      Prior Function            PT Goals (current goals can now be found in the care plan section) Acute Rehab PT Goals Patient Stated Goal: to be able to take care of myself again PT Goal Formulation: With patient Time For Goal Achievement: 09/13/13 Potential to Achieve Goals: Good Progress towards PT goals: Progressing toward goals    Frequency  Min 3X/week    PT Plan Frequency needs to be updated    Co-evaluation             End of Session Equipment Utilized During Treatment: Gait belt Activity Tolerance: Patient limited by pain Patient left: with call bell/phone within reach;with family/visitor present;in bed     Time: 4944-9675 PT Time Calculation (min): 18 min  Charges:  $Gait Training: 8-22  mins                    G Codes:      Philomena Doheny 09/03/2013, 11:05 AM [phone]

## 2013-09-03 NOTE — Discharge Summary (Signed)
Physician Discharge Summary  Patient ID: Vanessa [name] MRN: [id number] DOB/AGE: 1920/11/14 80 y.o.  Admit date: 08/28/2013 Discharge date: 09/03/2013  Discharge Diagnoses Patient Active Problem List   Diagnosis Date Noted  . Fall 09/03/2013  . Multiple rib fractures 08/28/2013  . PVD (peripheral vascular disease) 02/21/2013  . Long term (current) use of anticoagulants 02/05/2013  . Encounter for therapeutic drug monitoring 02/05/2013  . Occlusion and stenosis of carotid artery without mention of cerebral infarction 08/16/2012  . Varicose veins of lower extremities with other complications 79/02/4095  . Malaise and fatigue 10/06/2011  . Chest pain 04/02/2011  . Raynaud phenomenon 06/26/2010  . Osteoarthritis 06/26/2010  . Atrial fibrillation   . Neuropathy   . Hypercholesterolemia   . Scoliosis   . Hoarseness   . Headache   . Dizziness   . Bruises easily   . Pruritus     Consultants None   Procedures None   HPI: Vanessa [name] presented status post falling. She got up from her chair to answer the phone and fell against a chair hitting her left side. She did not hurt her neck. There was no loss of consciousness or head trauma. Plain radiographs of her chest showed multiple rib fractures and she was admitted to the trauma service for pulmonary toilet and mobilization.   Hospital Course: The patient had an unremarkable hospital course. Initially her coumadin was held but since she dropped her hemoglobin she was given vitamin K as well. Her anemia stabilized and she did not require transfusion. Despite a significant amount of pain she did not suffer any respiratory compromise from her fractures. Her pain was initially controlled with tramadol and NSAID's though the tramadol had to be stopped as she did not tolerate it well. She was mobilized with physical and occupational therapies who recommended skilled nursing facility placement. She was transferred there in stable condition. Of  note, her coumadin was restarted but as this represents her second fall in 3 months I will encourage her PCP to have a conversation regarding risk vs benefit of continued anticoagulation.      Medication List         acetaminophen 160 MG/5ML solution  Commonly known as:  TYLENOL  Take 31.3 mLs (1,000 mg total) by mouth every 6 (six) hours as needed (Pain).     ALPRAZolam 0.25 MG tablet  Commonly known as:  XANAX  Take 0.25 mg by mouth at bedtime as needed for anxiety.     atenolol 25 MG tablet  Commonly known as:  TENORMIN  Take 25 mg by mouth 2 (two) times daily.     BIOTIN 5000 PO  Take by mouth daily.     cetaphil cream  Apply topically as needed.     multivitamin tablet  Take 1 tablet by mouth daily.     naproxen 500 MG tablet  Commonly known as:  NAPROSYN  Take 1 tablet (500 mg total) by mouth 2 (two) times daily with a meal.     simvastatin 40 MG tablet  Commonly known as:  ZOCOR  Take 20 mg by mouth daily.     VIACTIV PO  Take 1 tablet by mouth daily.     warfarin 2 MG tablet  Commonly known as:  COUMADIN  Take 2-4 mg by mouth daily. 2mg  daily except 4mg  on Sunday             Follow-up Information   Schedule an appointment as soon as possible for a visit with  Vanessa Lennox, MD.   Specialty:  Family Medicine   Contact information:   [place] [address] [phone]       Call [place]. (As needed)    Contact information:   [address] [place] [address] [phone]       Signed: Lisette Abu, PA-C Pager: [phone] General Trauma PA Pager: [phone] 09/03/2013, 1:01 PM

## 2013-09-03 NOTE — Progress Notes (Signed)
Attempted to give report to [facility] nurse but went to voicemail.  I left my name and number for the nurse to call me back for report.  Will try calling later if I have not heard anything. Syliva Overman

## 2013-09-03 NOTE — Progress Notes (Signed)
Resting comfortably without oxygen in place.  SNF placement soon.  FL-2 has been signed.  This patient has been seen and I agree with the findings and treatment plan.  Kathryne Eriksson. Dahlia Bailiff, MD, [place] [phone] (pager) [phone] (direct pager) Trauma Surgeon

## 2013-09-03 NOTE — Clinical Social Work Note (Signed)
Patient going to [facility] on 09/03/13 Patient being transported by [facility]- called at 2pm Report #: [place], [place] Worker [phone]

## 2013-09-03 NOTE — Progress Notes (Signed)
Patient ID: Vanessa [name], female   DOB: 26-Feb-1920, 80 y.o.   MRN: [id number]   LOS: 6 days   Subjective: Feels terrible, NSC   Objective: Vital signs in last 24 hours: Temp:  [98.1 F (36.7 C)] 98.1 F (36.7 C) (08/24 0511) Pulse Rate:  [67-91] 85 (08/24 0511) Resp:  [17-19] 18 (08/24 0511) BP: (136-153)/(75-86) 152/75 mmHg (08/24 0511) SpO2:  [96 %-98 %] 96 % (08/24 0511) Last BM Date: 09/02/13   IS: 527ml (+277ml)   Laboratory  CBC  Recent Labs  09/03/13 0605  WBC 7.6  HGB 11.9*  HCT 36.5  PLT 248   Lab Results  Component Value Date   INR 1.21 09/03/2013   INR 1.55* 08/30/2013   INR 2.33* 08/29/2013    Physical Exam General appearance: alert and no distress Resp: clear to auscultation bilaterally Cardio: irregularly irregular rhythm GI: normal findings: bowel sounds normal and soft, non-tender   Assessment/Plan: Fall  Multiple left rib fxs -- Aggressive pulmonary toilet Multiple medical problems -- Home meds. Will need to have a discussion with PCP on risk/benefit ratio of continuing coumadin with increased fall risk. Note pharmacy concerns with NSAID + coumadin but think benefit outweighs risk in short term. Only plan to continue that 1-2 months. ABL anemia -- Improved FEN -- SL IV  VTE -- SCD's  Dispo -- SNF placement when bed available    Lisette Abu, PA-C Pager: [phone] General Trauma PA Pager: [phone]  09/03/2013

## 2013-09-03 NOTE — Progress Notes (Signed)
Report called to Mardene Celeste, RN at [facility]. RN denies any questions at this time but was instructed to call back if she thinks of any.  IV removed by NT.  VSS.  All documents sent with pt. Pt discharged via ambulance to [facility]. Syliva Overman

## 2013-09-03 NOTE — Progress Notes (Signed)
Speech Language Pathology Treatment: Dysphagia  Patient Details Name: Vanessa [name] MRN: [id number] DOB: Mar 18, 1920 Today's Date: 09/03/2013 Time: 4696-2952 SLP Time Calculation (min): 26 min  Assessment / Plan / Recommendation Clinical Impression  SLP provided skilled observation of advanced Dys 2 textures. Pt appears to have been tolerating current Dys 1 diet, although reports that she does not like the food and has limited intake. Demonstrated an intermittent second swallow, throughout trials, but without overt signs of aspiration. Pt utilized recommended swallowing strategies to maximize safety with Mod I. Recommend to advance textures to Dys 2 with continued SLP f/u to assess for tolerance.   HPI HPI: 80 year old female status post fall with multiple left rib fractures including ribs 5 through 9. Pt started on a diet after admit, but observed to choke with a piece of fruit. Pt has a history of dysphagia with OP MBS on 12/23/11 documenting normal oropharyngeal swallow, but with a moderate cervical esophageal dysphagia, with a prominent cricopharyngeus, and a small Zinkker's diverticulm below the UES. Pt recommended to consume a dys 3 diet/thin liquids and meds crushed in puree. Other PMH: Nonspecific esophageal motility disorder; HTN; Neuropathy, atipical chest pain, cervical spondylosiswith anterior subluxation of C3on C4, and C4 on C5 Previous Swallow Assessment: UGI with Dr. Cristina Gong 07/26/2007: Non-specific esophageal motility d/o; Small Zenker's Diverticulum' small diverticulum near the GE juncture; Probable narrowing of the distal esophagus at the Wineglass- juntion; spondylosis.   Pertinent Vitals Pain Assessment: No/denies pain Pain Score: 6  Pain Location: L side Pain Descriptors / Indicators: Sore Pain Intervention(s): Monitored during session;Repositioned;Premedicated before session;Patient requesting pain meds-RN notified  SLP Plan  Continue with current plan of care    Recommendations  Diet recommendations: Dysphagia 2 (fine chop);Thin liquid Liquids provided via: Cup;Straw Medication Administration: Crushed with puree Supervision: Patient able to self feed;Intermittent supervision to cue for compensatory strategies Compensations: Slow rate;Small sips/bites;Follow solids with liquid Postural Changes and/or Swallow Maneuvers: Out of bed for meals;Seated upright 90 degrees;Upright 30-60 min after meal              Oral Care Recommendations: Oral care BID Follow up Recommendations: 24 hour supervision/assistance;Home health SLP Plan: Continue with current plan of care    GO      Germain Osgood, M.A. CCC-SLP [phone]  Germain Osgood 09/03/2013, 12:52 PM

## 2013-09-04 DIAGNOSIS — IMO0001 Reserved for inherently not codable concepts without codable children: Secondary | ICD-10-CM | POA: Diagnosis not present

## 2013-09-04 DIAGNOSIS — I4891 Unspecified atrial fibrillation: Secondary | ICD-10-CM | POA: Diagnosis not present

## 2013-09-10 ENCOUNTER — Telehealth: Payer: Self-pay | Admitting: Family Medicine

## 2013-09-10 ENCOUNTER — Encounter (HOSPITAL_COMMUNITY): Payer: Self-pay | Admitting: Emergency Medicine

## 2013-09-10 ENCOUNTER — Inpatient Hospital Stay (HOSPITAL_COMMUNITY)
Admission: EM | Admit: 2013-09-10 | Discharge: 2013-09-15 | DRG: 377 | Disposition: A | Discharge status: Hospice | Payer: Medicare Other | Attending: Internal Medicine | Admitting: Internal Medicine

## 2013-09-10 ENCOUNTER — Inpatient Hospital Stay (HOSPITAL_COMMUNITY): Payer: Medicare Other

## 2013-09-10 ENCOUNTER — Emergency Department (HOSPITAL_COMMUNITY): Payer: Medicare Other

## 2013-09-10 DIAGNOSIS — Z9181 History of falling: Secondary | ICD-10-CM | POA: Diagnosis not present

## 2013-09-10 DIAGNOSIS — I739 Peripheral vascular disease, unspecified: Secondary | ICD-10-CM | POA: Diagnosis not present

## 2013-09-10 DIAGNOSIS — R0609 Other forms of dyspnea: Secondary | ICD-10-CM | POA: Diagnosis not present

## 2013-09-10 DIAGNOSIS — R5383 Other fatigue: Secondary | ICD-10-CM | POA: Diagnosis not present

## 2013-09-10 DIAGNOSIS — Z515 Encounter for palliative care: Secondary | ICD-10-CM | POA: Diagnosis not present

## 2013-09-10 DIAGNOSIS — I959 Hypotension, unspecified: Secondary | ICD-10-CM | POA: Diagnosis present

## 2013-09-10 DIAGNOSIS — E8809 Other disorders of plasma-protein metabolism, not elsewhere classified: Secondary | ICD-10-CM | POA: Diagnosis present

## 2013-09-10 DIAGNOSIS — F411 Generalized anxiety disorder: Secondary | ICD-10-CM | POA: Diagnosis not present

## 2013-09-10 DIAGNOSIS — R1310 Dysphagia, unspecified: Secondary | ICD-10-CM | POA: Diagnosis not present

## 2013-09-10 DIAGNOSIS — T45515A Adverse effect of anticoagulants, initial encounter: Secondary | ICD-10-CM | POA: Diagnosis present

## 2013-09-10 DIAGNOSIS — E785 Hyperlipidemia, unspecified: Secondary | ICD-10-CM | POA: Diagnosis present

## 2013-09-10 DIAGNOSIS — T394X5A Adverse effect of antirheumatics, not elsewhere classified, initial encounter: Secondary | ICD-10-CM | POA: Diagnosis present

## 2013-09-10 DIAGNOSIS — J189 Pneumonia, unspecified organism: Secondary | ICD-10-CM | POA: Diagnosis present

## 2013-09-10 DIAGNOSIS — Z452 Encounter for adjustment and management of vascular access device: Secondary | ICD-10-CM | POA: Diagnosis not present

## 2013-09-10 DIAGNOSIS — D72829 Elevated white blood cell count, unspecified: Secondary | ICD-10-CM | POA: Diagnosis present

## 2013-09-10 DIAGNOSIS — J9600 Acute respiratory failure, unspecified whether with hypoxia or hypercapnia: Secondary | ICD-10-CM

## 2013-09-10 DIAGNOSIS — R5381 Other malaise: Secondary | ICD-10-CM | POA: Diagnosis not present

## 2013-09-10 DIAGNOSIS — Z66 Do not resuscitate: Secondary | ICD-10-CM | POA: Diagnosis not present

## 2013-09-10 DIAGNOSIS — R0789 Other chest pain: Secondary | ICD-10-CM | POA: Diagnosis present

## 2013-09-10 DIAGNOSIS — K254 Chronic or unspecified gastric ulcer with hemorrhage: Secondary | ICD-10-CM | POA: Diagnosis present

## 2013-09-10 DIAGNOSIS — I9589 Other hypotension: Secondary | ICD-10-CM

## 2013-09-10 DIAGNOSIS — Z5181 Encounter for therapeutic drug level monitoring: Secondary | ICD-10-CM

## 2013-09-10 DIAGNOSIS — D62 Acute posthemorrhagic anemia: Secondary | ICD-10-CM

## 2013-09-10 DIAGNOSIS — S2242XS Multiple fractures of ribs, left side, sequela: Secondary | ICD-10-CM

## 2013-09-10 DIAGNOSIS — R6510 Systemic inflammatory response syndrome (SIRS) of non-infectious origin without acute organ dysfunction: Secondary | ICD-10-CM | POA: Diagnosis present

## 2013-09-10 DIAGNOSIS — W19XXXS Unspecified fall, sequela: Secondary | ICD-10-CM

## 2013-09-10 DIAGNOSIS — G9349 Other encephalopathy: Secondary | ICD-10-CM | POA: Diagnosis not present

## 2013-09-10 DIAGNOSIS — R531 Weakness: Secondary | ICD-10-CM

## 2013-09-10 DIAGNOSIS — D473 Essential (hemorrhagic) thrombocythemia: Secondary | ICD-10-CM | POA: Diagnosis present

## 2013-09-10 DIAGNOSIS — R0682 Tachypnea, not elsewhere classified: Secondary | ICD-10-CM | POA: Diagnosis present

## 2013-09-10 DIAGNOSIS — G609 Hereditary and idiopathic neuropathy, unspecified: Secondary | ICD-10-CM | POA: Diagnosis present

## 2013-09-10 DIAGNOSIS — J9601 Acute respiratory failure with hypoxia: Secondary | ICD-10-CM | POA: Diagnosis present

## 2013-09-10 DIAGNOSIS — Z7901 Long term (current) use of anticoagulants: Secondary | ICD-10-CM

## 2013-09-10 DIAGNOSIS — J90 Pleural effusion, not elsewhere classified: Secondary | ICD-10-CM | POA: Diagnosis not present

## 2013-09-10 DIAGNOSIS — R42 Dizziness and giddiness: Secondary | ICD-10-CM

## 2013-09-10 DIAGNOSIS — D500 Iron deficiency anemia secondary to blood loss (chronic): Secondary | ICD-10-CM | POA: Diagnosis not present

## 2013-09-10 DIAGNOSIS — R10819 Abdominal tenderness, unspecified site: Secondary | ICD-10-CM | POA: Diagnosis not present

## 2013-09-10 DIAGNOSIS — I482 Chronic atrial fibrillation, unspecified: Secondary | ICD-10-CM

## 2013-09-10 DIAGNOSIS — IMO0002 Reserved for concepts with insufficient information to code with codable children: Secondary | ICD-10-CM | POA: Diagnosis not present

## 2013-09-10 DIAGNOSIS — K921 Melena: Secondary | ICD-10-CM

## 2013-09-10 DIAGNOSIS — I4891 Unspecified atrial fibrillation: Secondary | ICD-10-CM | POA: Diagnosis not present

## 2013-09-10 DIAGNOSIS — E46 Unspecified protein-calorie malnutrition: Secondary | ICD-10-CM | POA: Diagnosis not present

## 2013-09-10 DIAGNOSIS — N83209 Unspecified ovarian cyst, unspecified side: Secondary | ICD-10-CM | POA: Diagnosis not present

## 2013-09-10 DIAGNOSIS — T7589XS Other specified effects of external causes, sequela: Secondary | ICD-10-CM

## 2013-09-10 DIAGNOSIS — I5030 Unspecified diastolic (congestive) heart failure: Secondary | ICD-10-CM | POA: Diagnosis not present

## 2013-09-10 DIAGNOSIS — I6529 Occlusion and stenosis of unspecified carotid artery: Secondary | ICD-10-CM | POA: Diagnosis present

## 2013-09-10 DIAGNOSIS — R110 Nausea: Secondary | ICD-10-CM | POA: Diagnosis not present

## 2013-09-10 DIAGNOSIS — K922 Gastrointestinal hemorrhage, unspecified: Secondary | ICD-10-CM | POA: Diagnosis present

## 2013-09-10 DIAGNOSIS — K2961 Other gastritis with bleeding: Principal | ICD-10-CM | POA: Diagnosis present

## 2013-09-10 DIAGNOSIS — S2249XA Multiple fractures of ribs, unspecified side, initial encounter for closed fracture: Secondary | ICD-10-CM | POA: Diagnosis present

## 2013-09-10 DIAGNOSIS — I4819 Other persistent atrial fibrillation: Secondary | ICD-10-CM

## 2013-09-10 DIAGNOSIS — I369 Nonrheumatic tricuspid valve disorder, unspecified: Secondary | ICD-10-CM

## 2013-09-10 DIAGNOSIS — R0600 Dyspnea, unspecified: Secondary | ICD-10-CM

## 2013-09-10 DIAGNOSIS — K2970 Gastritis, unspecified, without bleeding: Secondary | ICD-10-CM | POA: Diagnosis not present

## 2013-09-10 DIAGNOSIS — J9819 Other pulmonary collapse: Secondary | ICD-10-CM | POA: Diagnosis not present

## 2013-09-10 LAB — URINALYSIS, ROUTINE W REFLEX MICROSCOPIC
Bilirubin Urine: NEGATIVE
Glucose, UA: NEGATIVE mg/dL
Ketones, ur: NEGATIVE mg/dL
Leukocytes, UA: NEGATIVE
Nitrite: NEGATIVE
Protein, ur: NEGATIVE mg/dL
Specific Gravity, Urine: >1.046 — ABNORMAL HIGH (ref 1.005–1.030)
Urobilinogen, UA: 0.2 mg/dL (ref 0.0–1.0)
pH: 5.5 (ref 5.0–8.0)

## 2013-09-10 LAB — BASIC METABOLIC PANEL
Anion gap: 11 (ref 5–15)
BUN: 37 mg/dL — ABNORMAL HIGH (ref 6–23)
CO2: 25 mEq/L (ref 19–32)
Calcium: 8.3 mg/dL — ABNORMAL LOW (ref 8.4–10.5)
Chloride: 105 mEq/L (ref 96–112)
Creatinine, Ser: 0.76 mg/dL (ref 0.50–1.10)
GFR calc Af Amer: 83 mL/min — ABNORMAL LOW (ref >90)
GFR calc non Af Amer: 72 mL/min — ABNORMAL LOW (ref >90)
Glucose, Bld: 118 mg/dL — ABNORMAL HIGH (ref 70–99)
Potassium: 4.4 mEq/L (ref 3.7–5.3)
Sodium: 141 mEq/L (ref 137–147)

## 2013-09-10 LAB — COMPREHENSIVE METABOLIC PANEL
ALT: 15 U/L (ref 0–35)
AST: 19 U/L (ref 0–37)
Albumin: 2.4 g/dL — ABNORMAL LOW (ref 3.5–5.2)
Alkaline Phosphatase: 105 U/L (ref 39–117)
Anion gap: 11 (ref 5–15)
BUN: 28 mg/dL — ABNORMAL HIGH (ref 6–23)
CO2: 30 mEq/L (ref 19–32)
Calcium: 9.1 mg/dL (ref 8.4–10.5)
Chloride: 98 mEq/L (ref 96–112)
Creatinine, Ser: 0.88 mg/dL (ref 0.50–1.10)
GFR calc Af Amer: 65 mL/min — ABNORMAL LOW (ref >90)
GFR calc non Af Amer: 56 mL/min — ABNORMAL LOW (ref >90)
Glucose, Bld: 163 mg/dL — ABNORMAL HIGH (ref 70–99)
Potassium: 4.5 mEq/L (ref 3.7–5.3)
Sodium: 139 mEq/L (ref 137–147)
Total Bilirubin: 0.3 mg/dL (ref 0.3–1.2)
Total Protein: 6 g/dL (ref 6.0–8.3)

## 2013-09-10 LAB — CBC WITH DIFFERENTIAL/PLATELET
Basophils Absolute: 0 10*3/uL (ref 0.0–0.1)
Basophils Absolute: 0 10*3/uL (ref 0.0–0.1)
Basophils Relative: 0 % (ref 0–1)
Basophils Relative: 0 % (ref 0–1)
Eosinophils Absolute: 0.1 10*3/uL (ref 0.0–0.7)
Eosinophils Absolute: 0.1 10*3/uL (ref 0.0–0.7)
Eosinophils Relative: 1 % (ref 0–5)
Eosinophils Relative: 1 % (ref 0–5)
HCT: 30.3 % — ABNORMAL LOW (ref 36.0–46.0)
HCT: 21.1 % — ABNORMAL LOW (ref 36.0–46.0)
Hemoglobin: 10.1 g/dL — ABNORMAL LOW (ref 12.0–15.0)
Hemoglobin: 7.1 g/dL — ABNORMAL LOW (ref 12.0–15.0)
Lymphocytes Relative: 5 % — ABNORMAL LOW (ref 12–46)
Lymphocytes Relative: 11 % — ABNORMAL LOW (ref 12–46)
Lymphs Abs: 1 10*3/uL (ref 0.7–4.0)
Lymphs Abs: 2 10*3/uL (ref 0.7–4.0)
MCH: 31.7 pg (ref 26.0–34.0)
MCH: 32.1 pg (ref 26.0–34.0)
MCHC: 33.3 g/dL (ref 30.0–36.0)
MCHC: 33.6 g/dL (ref 30.0–36.0)
MCV: 95 fL (ref 78.0–100.0)
MCV: 95.5 fL (ref 78.0–100.0)
Monocytes Absolute: 1.4 10*3/uL — ABNORMAL HIGH (ref 0.1–1.0)
Monocytes Absolute: 1.7 10*3/uL — ABNORMAL HIGH (ref 0.1–1.0)
Monocytes Relative: 8 % (ref 3–12)
Monocytes Relative: 9 % (ref 3–12)
Neutro Abs: 15.3 10*3/uL — ABNORMAL HIGH (ref 1.7–7.7)
Neutro Abs: 14.8 10*3/uL — ABNORMAL HIGH (ref 1.7–7.7)
Neutrophils Relative %: 86 % — ABNORMAL HIGH (ref 43–77)
Neutrophils Relative %: 79 % — ABNORMAL HIGH (ref 43–77)
Platelets: 444 10*3/uL — ABNORMAL HIGH (ref 150–400)
Platelets: 379 10*3/uL (ref 150–400)
RBC: 3.19 MIL/uL — ABNORMAL LOW (ref 3.87–5.11)
RBC: 2.21 MIL/uL — ABNORMAL LOW (ref 3.87–5.11)
RDW: 15 % (ref 11.5–15.5)
RDW: 15.4 % (ref 11.5–15.5)
WBC: 17.9 10*3/uL — ABNORMAL HIGH (ref 4.0–10.5)
WBC: 18.7 10*3/uL — ABNORMAL HIGH (ref 4.0–10.5)

## 2013-09-10 LAB — PREPARE RBC (CROSSMATCH)

## 2013-09-10 LAB — HEMOGLOBIN AND HEMATOCRIT, BLOOD
HCT: 25.1 % — ABNORMAL LOW (ref 36.0–46.0)
HCT: 21.2 % — ABNORMAL LOW (ref 36.0–46.0)
Hemoglobin: 8.4 g/dL — ABNORMAL LOW (ref 12.0–15.0)
Hemoglobin: 7 g/dL — ABNORMAL LOW (ref 12.0–15.0)

## 2013-09-10 LAB — I-STAT CG4 LACTIC ACID, ED: Lactic Acid, Venous: 1.41 mmol/L (ref 0.5–2.2)

## 2013-09-10 LAB — PROTIME-INR
INR: 3.02 — ABNORMAL HIGH (ref 0.00–1.49)
INR: 1.93 — ABNORMAL HIGH (ref 0.00–1.49)
Prothrombin Time: 31.3 seconds — ABNORMAL HIGH (ref 11.6–15.2)
Prothrombin Time: 22.1 seconds — ABNORMAL HIGH (ref 11.6–15.2)

## 2013-09-10 LAB — URINE MICROSCOPIC-ADD ON

## 2013-09-10 LAB — SAMPLE TO BLOOD BANK

## 2013-09-10 LAB — ABO/RH: ABO/RH(D): B NEG

## 2013-09-10 LAB — LACTIC ACID, PLASMA: Lactic Acid, Venous: 0.9 mmol/L (ref 0.5–2.2)

## 2013-09-10 LAB — MRSA PCR SCREENING: MRSA by PCR: NEGATIVE

## 2013-09-10 LAB — MAGNESIUM: Magnesium: 1.8 mg/dL (ref 1.5–2.5)

## 2013-09-10 LAB — POC OCCULT BLOOD, ED: Fecal Occult Bld: POSITIVE — AB

## 2013-09-10 MED ORDER — PANTOPRAZOLE SODIUM 40 MG IV SOLR
40.0000 mg | Freq: Once | INTRAVENOUS | Status: AC
  Administered 2013-09-10: 40 mg via INTRAVENOUS
  Filled 2013-09-10: qty 40

## 2013-09-10 MED ORDER — PANTOPRAZOLE SODIUM 40 MG IV SOLR: 40.0000 mg | Freq: Two times a day (BID) | INTRAVENOUS | Status: DC

## 2013-09-10 MED ORDER — ADULT MULTIVITAMIN W/MINERALS CH
1.0000 | ORAL_TABLET | Freq: Every day | ORAL | Status: DC
  Administered 2013-09-10: 1 via ORAL
  Filled 2013-09-10: qty 1

## 2013-09-10 MED ORDER — ONDANSETRON HCL 4 MG/2ML IJ SOLN
4.0000 mg | Freq: Once | INTRAMUSCULAR | Status: AC
  Administered 2013-09-10: 4 mg via INTRAVENOUS
  Filled 2013-09-10: qty 2

## 2013-09-10 MED ORDER — VITAMIN K1 10 MG/ML IJ SOLN
5.0000 mg | Freq: Once | INTRAMUSCULAR | Status: AC
  Administered 2013-09-10: 5 mg via SUBCUTANEOUS
  Filled 2013-09-10: qty 0.5

## 2013-09-10 MED ORDER — SODIUM CHLORIDE 0.9 % IV SOLN: Freq: Once | INTRAVENOUS | Status: DC

## 2013-09-10 MED ORDER — SUCRALFATE 1 GM/10ML PO SUSP
1.0000 g | Freq: Three times a day (TID) | ORAL | Status: DC
  Administered 2013-09-10 – 2013-09-13 (×10): 1 g via ORAL
  Filled 2013-09-10 (×20): qty 10

## 2013-09-10 MED ORDER — SODIUM CHLORIDE 0.9 % IJ SOLN
3.0000 mL | Freq: Two times a day (BID) | INTRAMUSCULAR | Status: DC
  Administered 2013-09-10 – 2013-09-13 (×5): 3 mL via INTRAVENOUS

## 2013-09-10 MED ORDER — IOHEXOL 300 MG/ML  SOLN
100.0000 mL | Freq: Once | INTRAMUSCULAR | Status: AC | PRN
  Administered 2013-09-10: 100 mL via INTRAVENOUS

## 2013-09-10 MED ORDER — ONDANSETRON HCL 4 MG/2ML IJ SOLN: 4.0000 mg | Freq: Four times a day (QID) | INTRAMUSCULAR | Status: DC | PRN

## 2013-09-10 MED ORDER — SODIUM CHLORIDE 0.9 % IV SOLN
80.0000 mg | Freq: Once | INTRAVENOUS | Status: DC
  Filled 2013-09-10: qty 80

## 2013-09-10 MED ORDER — ALPRAZOLAM 0.25 MG PO TABS: 0.2500 mg | ORAL_TABLET | Freq: Every evening | ORAL | Status: DC | PRN

## 2013-09-10 MED ORDER — FENTANYL CITRATE 0.05 MG/ML IJ SOLN: 12.5000 ug | INTRAMUSCULAR | Status: DC | PRN

## 2013-09-10 MED ORDER — SODIUM CHLORIDE 0.9 % IV SOLN
80.0000 mg | Freq: Two times a day (BID) | INTRAVENOUS | Status: DC
  Administered 2013-09-11: 80 mg via INTRAVENOUS
  Filled 2013-09-10 (×4): qty 80

## 2013-09-10 MED ORDER — ONDANSETRON HCL 4 MG PO TABS: 4.0000 mg | ORAL_TABLET | Freq: Four times a day (QID) | ORAL | Status: DC | PRN

## 2013-09-10 MED ORDER — OXYCODONE HCL 5 MG PO TABS: 5.0000 mg | ORAL_TABLET | ORAL | Status: DC | PRN

## 2013-09-10 MED ORDER — SODIUM CHLORIDE 0.9 % IV SOLN
8.0000 mg/h | INTRAVENOUS | Status: DC
  Administered 2013-09-10: 8 mg/h via INTRAVENOUS
  Filled 2013-09-10 (×3): qty 80

## 2013-09-10 MED ORDER — IOHEXOL 300 MG/ML  SOLN
50.0000 mL | Freq: Once | INTRAMUSCULAR | Status: AC | PRN
  Administered 2013-09-10: 50 mL via ORAL

## 2013-09-10 MED ORDER — ACETAMINOPHEN 160 MG/5ML PO SOLN: 1000.0000 mg | Freq: Four times a day (QID) | ORAL | Status: DC | PRN

## 2013-09-10 MED ORDER — SIMVASTATIN 10 MG PO TABS
20.0000 mg | ORAL_TABLET | Freq: Every day | ORAL | Status: DC
  Administered 2013-09-10: 20 mg via ORAL
  Filled 2013-09-10: qty 1

## 2013-09-10 MED ORDER — SODIUM CHLORIDE 0.9 % IV SOLN: 20.0000 mL | INTRAVENOUS | Status: DC

## 2013-09-10 MED ORDER — SODIUM CHLORIDE 0.9 % IV BOLUS (SEPSIS)
500.0000 mL | Freq: Once | INTRAVENOUS | Status: AC
  Administered 2013-09-10: 500 mL via INTRAVENOUS

## 2013-09-10 MED ORDER — PHYTONADIONE 5 MG PO TABS
5.0000 mg | ORAL_TABLET | Freq: Every day | ORAL | Status: DC
  Administered 2013-09-11 – 2013-09-13 (×3): 5 mg via ORAL
  Filled 2013-09-10 (×3): qty 1

## 2013-09-10 NOTE — ED Notes (Signed)
Per EMS pt presents d/t c/o abdominal pain, tarry stools, nausea and coffee ground emesis.  Pt is A&O x 4.  Pt is from [facility].

## 2013-09-10 NOTE — Consult Note (Signed)
Subjective:   HPI  The patient is a 80 year old female who sees Dr. Cristina Gong who is her gastroenterologist. She has a history of chronic atrial fibrillation and has been on chronic anticoagulation with Coumadin. She was admitted to the hospital from August 18 2 August 24 after falling and suffering multiple rib fractures. Her Coumadin was reversed at that time but it was presumed at the time of discharge. She took Tylenol as well as Naprosyn for rib cage pain after discharge. Last evening she started to experience melena and coffee-ground emesis. She has no complaints of abdominal pain. Her most recent hemoglobin and hematocrit were 8.4 and 25.1 respectively. Her prothrombin time is 31.3 with INR 3.02. Her Coumadin has been stopped. I spoke with Dr. Sheran Fava who asked that we come and evaluate. She is going to be transfused blood as well as fresh frozen plasma and transferred to step down.  Review of Systems Denies chest pain or shortness of breath at this time.  Past Medical History  Diagnosis Date  . Atrial fibrillation   . Neuropathy     PERIPHERAL  . Carotid artery occlusion     LEFT  . Scoliosis   . Hoarseness   . Headache(784.0)   . Dizziness   . Abdominal bloating   . Bruises easily   . Pruritus   . Hypercholesterolemia   . Fall at home Sept 2013, Dec. 2013  Jun 08, 2012  . Varicose veins    Past Surgical History  Procedure Laterality Date  . Gallbladder surgery    . Abdominal hysterectomy  1954  . Spine surgery  1997  . Cholecystectomy  1997    GalL Bladder   History   Social History  . Marital Status: Widowed    Spouse Name: N/A    Number of Children: 2  . Years of Education: 12+   Occupational History  . Not on file.   Social History Main Topics  . Smoking status: Never Smoker   . Smokeless tobacco: Never Used  . Alcohol Use: No  . Drug Use: No  . Sexual Activity: No   Other Topics Concern  . Not on file   Social History Narrative   Patient lives at home  alone.    Patient is a widowed.    Patient is retired.    Patient has no children but adopted twin sons.    Patient has a college degree.    family history includes Cancer in her sister; Diabetes in her mother; Heart attack in her father and mother; Heart disease in her maternal grandmother, mother, and another family member; Stroke in her father. Current facility-administered medications:0.9 %  sodium chloride infusion, , Intravenous, Once, Janece Canterbury, MD;  0.9 %  sodium chloride infusion, , Intravenous, Once, Janece Canterbury, MD;  acetaminophen (TYLENOL) solution 1,000 mg, 1,000 mg, Oral, Q6H PRN, Janece Canterbury, MD;  ALPRAZolam Duanne Moron) tablet 0.25 mg, 0.25 mg, Oral, QHS PRN, Janece Canterbury, MD multivitamin with minerals tablet 1 tablet, 1 tablet, Oral, Daily, Janece Canterbury, MD, 1 tablet at 09/10/13 1357;  ondansetron (ZOFRAN) injection 4 mg, 4 mg, Intravenous, Q6H PRN, Janece Canterbury, MD;  ondansetron [facility]) tablet 4 mg, 4 mg, Oral, Q6H PRN, Janece Canterbury, MD;  oxyCODONE (Oxy IR/ROXICODONE) immediate release tablet 5 mg, 5 mg, Oral, Q4H PRN, Janece Canterbury, MD pantoprazole (PROTONIX) 80 mg in sodium chloride 0.9 % 100 mL IVPB, 80 mg, Intravenous, Once, Janece Canterbury, MD;  pantoprazole (PROTONIX) 80 mg in sodium chloride 0.9 % 250  mL (0.32 mg/mL) infusion, 8 mg/hr, Intravenous, Continuous, Janece Canterbury, MD, Last Rate: 25 mL/hr at 09/10/13 1353, 8 mg/hr at 09/10/13 1353;  [START ON 09/13/2013] pantoprazole (PROTONIX) injection 40 mg, 40 mg, Intravenous, Q12H, Janece Canterbury, MD Derrill Memo ON 09/11/2013] phytonadione (VITAMIN K) tablet 5 mg, 5 mg, Oral, Daily, Janece Canterbury, MD;  simvastatin (ZOCOR) tablet 20 mg, 20 mg, Oral, Daily, Janece Canterbury, MD, 20 mg at 09/10/13 1357;  sodium chloride 0.9 % injection 3 mL, 3 mL, Intravenous, Q12H, Janece Canterbury, MD;  sucralfate (CARAFATE) 1 GM/10ML suspension 1 g, 1 g, Oral, TID WC & HS, Janece Canterbury, MD, 1 g at 09/10/13 1357 Allergies   Allergen Reactions  . Tape Other (See Comments)    Tears skin. Band-Aids     Objective:     BP 108/49  Pulse 91  Temp(Src) 99.2 F (37.3 C) (Oral)  Resp 20  Ht 5\' 2"  (1.575 m)  Wt 50.803 kg (112 lb)  BMI 20.48 kg/m2  SpO2 96%  She is in no distress  Heart irregular rhythm  Lungs with rales at bases  Abdomen: All sounds present, soft, nontender  Laboratory No components found with this basename: d1      Assessment:     #1. Upper GI bleed characterized by coffee-ground emesis and melena  #2. Coagulopathy secondary to Coumadin  #3. Multiple medical problems as mentioned above      Plan:     Agree with transfer to step down, PPI therapy, reversing effects of Coumadin with FFP, blood transfusion as necessary. Monitor closely for continued active bleeding. She will probably need EGD, but I will wait at this time until coagulopathy is corrected. We will keep n.p.o. for now in case emergent endoscopy is required.    Component Value Date/Time   WBC 17.9* 09/10/2013 0449   WBC 8.1 07/27/2011 1149   HGB 8.4* 09/10/2013 1152   HGB 14.0 07/27/2011 1149   HCT 25.1* 09/10/2013 1152   HCT 45.1 07/27/2011 1149   PLT 444* 09/10/2013 0449   ALT 15 09/10/2013 0449   AST 19 09/10/2013 0449   NA 139 09/10/2013 0449   K 4.5 09/10/2013 0449   CL 98 09/10/2013 0449   CREATININE 0.88 09/10/2013 0449   CREATININE 0.95 06/23/2011 1056   BUN 28* 09/10/2013 0449   CO2 30 09/10/2013 0449   CALCIUM 9.1 09/10/2013 0449   ALKPHOS 105 09/10/2013 0449    Lab Results  Component Value Date   HGB 8.4* 09/10/2013   HGB 10.1* 09/10/2013   HGB 11.9* 09/03/2013   HGB 14.0 07/27/2011   HGB 13.8 06/23/2011   HCT 25.1* 09/10/2013   HCT 30.3* 09/10/2013   HCT 36.5 09/03/2013   HCT 45.1 07/27/2011   HCT 43.9 06/23/2011   ALKPHOS 105 09/10/2013   ALKPHOS 92 07/09/2013   ALKPHOS 95 07/20/2012   AST 19 09/10/2013   AST 28 07/09/2013   AST 27 07/20/2012   ALT 15 09/10/2013   ALT 14 07/09/2013   ALT 17 07/20/2012

## 2013-09-10 NOTE — ED Provider Notes (Signed)
Results for orders placed during the hospital encounter of 09/10/13  CBC WITH DIFFERENTIAL      Result Value Ref Range   WBC 17.9 (*) 4.0 - 10.5 K/uL   RBC 3.19 (*) 3.87 - 5.11 MIL/uL   Hemoglobin 10.1 (*) 12.0 - 15.0 g/dL   HCT 30.3 (*) 36.0 - 46.0 %   MCV 95.0  78.0 - 100.0 fL   MCH 31.7  26.0 - 34.0 pg   MCHC 33.3  30.0 - 36.0 g/dL   RDW 15.0  11.5 - 15.5 %   Platelets 444 (*) 150 - 400 K/uL   Neutrophils Relative % 86 (*) 43 - 77 %   Neutro Abs 15.3 (*) 1.7 - 7.7 K/uL   Lymphocytes Relative 5 (*) 12 - 46 %   Lymphs Abs 1.0  0.7 - 4.0 K/uL   Monocytes Relative 8  3 - 12 %   Monocytes Absolute 1.4 (*) 0.1 - 1.0 K/uL   Eosinophils Relative 1  0 - 5 %   Eosinophils Absolute 0.1  0.0 - 0.7 K/uL   Basophils Relative 0  0 - 1 %   Basophils Absolute 0.0  0.0 - 0.1 K/uL  COMPREHENSIVE METABOLIC PANEL      Result Value Ref Range   Sodium 139  137 - 147 mEq/L   Potassium 4.5  3.7 - 5.3 mEq/L   Chloride 98  96 - 112 mEq/L   CO2 30  19 - 32 mEq/L   Glucose, Bld 163 (*) 70 - 99 mg/dL   BUN 28 (*) 6 - 23 mg/dL   Creatinine, Ser 0.88  0.50 - 1.10 mg/dL   Calcium 9.1  8.4 - 10.5 mg/dL   Total Protein 6.0  6.0 - 8.3 g/dL   Albumin 2.4 (*) 3.5 - 5.2 g/dL   AST 19  0 - 37 U/L   ALT 15  0 - 35 U/L   Alkaline Phosphatase 105  39 - 117 U/L   Total Bilirubin 0.3  0.3 - 1.2 mg/dL   GFR calc non Af Amer 56 (*) >90 mL/min   GFR calc Af Amer 65 (*) >90 mL/min   Anion gap 11  5 - 15  PROTIME-INR      Result Value Ref Range   Prothrombin Time 31.3 (*) 11.6 - 15.2 seconds   INR 3.02 (*) 0.00 - 1.49  POC OCCULT BLOOD, ED      Result Value Ref Range   Fecal Occult Bld POSITIVE (*) NEGATIVE  I-STAT CG4 LACTIC ACID, ED      Result Value Ref Range   Lactic Acid, Venous 1.41  0.5 - 2.2 mmol/L  SAMPLE TO BLOOD BANK      Result Value Ref Range   Blood Bank Specimen SAMPLE AVAILABLE FOR TESTING     Sample Expiration 09/13/2013     Dg Ribs Unilateral W/chest Left  08/28/2013   CLINICAL DATA:   [place] today at after losing her balance, LEFT flank pain  EXAM: LEFT RIBS AND CHEST - 3+ VIEW  COMPARISON:  Chest radiographs 06/09/2012  FINDINGS: Enlargement of cardiac silhouette.  Mild tortuosity of thoracic aorta.  Mediastinal contours and pulmonary vascularity otherwise normal.  Chronic bronchitic changes with minimal LEFT basilar atelectasis.  No acute infiltrate, pleural effusion or pneumothorax.  Diffuse osseous demineralization.  BB placed at site of symptoms lower lateral LEFT chest.  Multiple LEFT rib fractures identified at the LEFT fifth, sixth, seventh, eighth and ninth ribs, lateral to posterolateral in  location.  Degenerative disc disease changes and scoliosis of the thoracolumbar spine with evidence of prior lumbar spinal fixation.  Superimposed costal cartilaginous calcifications identified.  IMPRESSION: Chronic bronchitic changes and minimal LEFT basilar atelectasis.  Displaced fractures of the LEFT fifth through ninth ribs as above.   Electronically Signed   By: Lavonia Dana M.D.   On: 08/28/2013 16:54   Ct Abdomen Pelvis W Contrast  09/10/2013   CLINICAL DATA:  Elevated white blood cell count. Abdominal distention. Tarry stools  EXAM: CT ABDOMEN AND PELVIS WITH CONTRAST  TECHNIQUE: Multidetector CT imaging of the abdomen and pelvis was performed using the standard protocol following bolus administration of intravenous contrast.  CONTRAST:  159mL OMNIPAQUE IOHEXOL 300 MG/ML  SOLN  COMPARISON:  Chest radiograph 09/02/2013  FINDINGS: Left lateral rib fractures again noted. There bilateral pleural effusions greater on the left. There is bibasilar atelectasis associated these moderate effusions. No pericardial fluid.  There is no focal hepatic lesion. Gallbladder is not identified. Pancreas, spleen, adrenal glands, kidneys are normal. There is significant streak artifact through the abdomen from the posterior lumbar fixation.  Stomach, small bowel, and cecum are normal. Appendix is not identified.  The sigmoid colon has multiple diverticula. No acute diverticulitis.  Abdominal aorta is normal caliber with intimal calcification. No retroperitoneal periportal lymphadenopathy.  No free fluid in the pelvis. There is a 5.7 cm cyst associated with the right ovary. Post hysterectomy anatomy. Bladder is normal. No aggressive osseous lesion. Posterior lumbar fusion again noted.  IMPRESSION: 1. No acute abdominal or pelvic findings. No explanation for elevated white blood cell count. 2. Bilateral moderate to large pleural effusions with associated atelectasis. 3. Posterior left rib fractures. 4. Large benign appearing cyst associated with the right ovary. In late postmenopausal female, recommend abdominal ultrasound further evaluation. This recommendation follows ACR consensus guidelines: White Paper of the ACR Incidental Findings Committee II on Adnexal Findings. J Am Coll Radiol [phone].   Electronically Signed   By: Suzy Bouchard M.D.   On: 09/10/2013 08:36   Dg Chest Port 1 View  09/02/2013   CLINICAL DATA:  Left fifth rib fracture with pleural effusion and CHF  EXAM: PORTABLE CHEST - 1 VIEW  COMPARISON:  Portable chest x-ray of August 31, 2013  FINDINGS: The lungs are adequately inflated. There is a moderate-sized left pleural effusion and small right pleural effusion which have increased slightly in volume. There is no pneumothorax. There is apical pleural thickening bilaterally. There are fractures of the lateral aspects of the fourth, fifth the mediastinum is normal in width. The cardiac silhouette is not enlarged. The pulmonary vascularity is not clearly engorged. , sixth, and seventh ribs laterally on the left.  IMPRESSION: Multiple left lateral rib fractures are present with bilateral pleural effusions but no pneumothorax or pneumomediastinum. When compared to yesterday's study allowing for differences in positioning there has been slight increase in the volume of the pleural effusions.    Electronically Signed   By: David  Martinique   On: 09/02/2013 08:51   Dg Chest Port 1 View  08/31/2013   CLINICAL DATA:  Pleural effusion.  Left rib fractures.  EXAM: PORTABLE CHEST - 1 VIEW  COMPARISON:  08/30/2013.  FINDINGS: Mediastinum hilar structures normal. Cardiomegaly with pulmonary vascular prominence, interstitial prominence and bilateral pleural effusions again noted. These findings consistent with congestive heart failure. There has been progression of pulmonary edema on today's exam. No pneumothorax. Multiple left lower rib fractures are again noted.  IMPRESSION: 1. Findings consistent  with congestive heart failure with pulmonary edema and bilateral pleural effusions. Pulmonary edema has increased slightly on today's exam .  2.  Stable left lower rib fractures.  No pneumothorax.   Electronically Signed   By: Marcello Moores  Register   On: 08/31/2013 07:54   Dg Chest Port 1 View  08/30/2013   CLINICAL DATA:  Rib fractures.  EXAM: PORTABLE CHEST - 1 VIEW  COMPARISON:  Single view of the chest 08/29/2013.  FINDINGS: Moderately large bilateral pleural effusions, worse on the left, have increased. There is associated compressive atelectasis. No pneumothorax identified. Heart size is enlarged. Left rib fractures are again seen.  IMPRESSION: Increased left greater than right pleural effusions and atelectasis.   Electronically Signed   By: Inge Rise M.D.   On: 08/30/2013 07:51   Dg Chest Port 1 View  08/29/2013   CLINICAL DATA:  follow rib fractures  EXAM: PORTABLE CHEST - 1 VIEW  COMPARISON:  08/28/2013; 06/09/2012  FINDINGS: Grossly unchanged enlarged cardiac silhouette and mediastinal contours with atherosclerotic plaque within the thoracic aorta. The pulmonary vasculature appears less distinct on present examination with cephalization of flow. Grossly unchanged small bilateral pleural effusions. Slight worsening of bibasilar heterogeneous opacities, left greater than right. No pneumothorax. Minimally  displaced fractures involving the posterior and lateral aspects of the left 5th through 9th ribs are re- demonstrated.  IMPRESSION: 1. Findings suggestive of worsening pulmonary edema and bibasilar opacities, left greater than right, atelectasis versus infiltrate. 2. Re- demonstrated minimally displaced fractures involving the posterior and lateral aspects of the left 5th through 9th ribs.   Electronically Signed   By: Sandi Mariscal M.D.   On: 08/29/2013 07:39    Pt with melena, small drop in hgb.  Ct neg for acute findings.  Discussed with Dr. Sheran Fava who will admit pt.  Malvin Johns, MD 09/10/13 [phone]

## 2013-09-10 NOTE — Telephone Encounter (Signed)
Pt wanted Dr. Leward Quan to know she is being admitted into [facility] on 09/10/2013 for GI Bleeding - Dr. Janece Canterbury is the admitting Dr. Abbott Pao was also admitted to [facility] on 08/28/2013 for fracture ribs, but did not follow up after visit.  If Dr. Leward Quan needs any clinical data she can call Kim @ [facility]  Pt will be in [facility]

## 2013-09-10 NOTE — H&P (Signed)
[facility] History and Physical  Vanessa [name] QQV:[id number] DOB: 1920-05-18 DOA: 09/10/2013  Referring physician:  Jerilynn Mages. [place] PCP:  Ellsworth Lennox, MD   Chief Complaint:  Melena, coffee ground emesis  HPI:  The patient is a 80 y.o. year-old female with history of a-fib on chronic anticoagulation, carotid artery occlusion, HLD, and recurrent falls who presents with hematemesis and melena.  The patient was admitted from 8/18 to 8/24 after a fall which resulted in multiple rib fractures.  Her coumadin was stopped and reversed at that time but resumed at discharge.  She continued to have rib pain and was receiving tylenol and naproxen 500mg  BID for her pain.  She has had abdominal bloating x 1 week.  Overnight last night, she developed dark tarry stools and coffee ground emesis, multiple episodes.  She denies abdominal pain, fevers, chills.  She did not eat well yesterday.  Denies dysuria.  Has new wheeze/SOB today.    In the ER, mild hypotension to 94/61, HR 125, R 30, 89% on RA.  WBC 17.9, hgb 10.1 (down from 12.2 on 8/26), plt 444, BUN 28, INR 30.2.  CT abd/pelvis demonstrated no acute finding, bilateral moderate to large pleural effusions, posterior left rib fractures, and a benign appearing right ovarian cyst.     Review of Systems:  General:  Denies fevers, chills, weight loss or gain HEENT:  Denies changes to hearing and vision, rhinorrhea, sinus congestion, sore throat CV:  Denies chest pain and palpitations, lower extremity edema.  PULM:  + SOB, wheezing, today.  denies cough.   GI:  Per HPI   GU:  Denies dysuria, frequency, urgency ENDO:  Denies polyuria, polydipsia.   HEME:  Denies hematemesis, blood in stools, melena, abnormal bruising or bleeding.  LYMPH:  Denies lymphadenopathy.   MSK:  Severe posterior left rib pain DERM:  Denies skin rash or ulcer.   NEURO:  Denies focal numbness, weakness, slurred speech, confusion, facial droop.  PSYCH:  Denies anxiety and  depression.    Past Medical History  Diagnosis Date  . Atrial fibrillation   . Neuropathy     PERIPHERAL  . Carotid artery occlusion     LEFT  . Scoliosis   . Hoarseness   . Headache(784.0)   . Dizziness   . Abdominal bloating   . Bruises easily   . Pruritus   . Hypercholesterolemia   . Fall at home Sept 2013, Dec. 2013  Jun 08, 2012  . Varicose veins    Past Surgical History  Procedure Laterality Date  . Gallbladder surgery    . Abdominal hysterectomy  1954  . Spine surgery  1997  . Cholecystectomy  1997    GalL Bladder   Social History:  reports that she has never smoked. She has never used smokeless tobacco. She reports that she does not drink alcohol or use illicit drugs. Patient lives at home alone.  Patient is a widowed.  Patient is retired.  Patient has no children but adopted twin sons.  Patient has a college degree.   Allergies  Allergen Reactions  . Tape Other (See Comments)    Tears skin. Band-Aids    Family History  Problem Relation Age of Onset  . Heart disease    . Diabetes Mother   . Heart attack Mother   . Heart disease Mother   . Heart attack Father   . Stroke Father   . Cancer Sister   . Heart disease Maternal Grandmother  Prior to Admission medications   Medication Sig Start Date End Date Taking? Authorizing Provider  acetaminophen (TYLENOL) 160 MG/5ML solution Take 31.3 mLs (1,000 mg total) by mouth every 6 (six) hours as needed (Pain). 09/03/13  Yes Lisette Abu, PA-C  ALPRAZolam Duanne Moron) 0.25 MG tablet Take 0.25 mg by mouth at bedtime as needed for anxiety.   Yes Historical Provider, MD  atenolol (TENORMIN) 25 MG tablet Take 25 mg by mouth 2 (two) times daily.   Yes Historical Provider, MD  Calcium-Vitamin D-Vitamin K (VIACTIV PO) Take 1 tablet by mouth daily.    Yes Historical Provider, MD  Multiple Vitamin (MULTIVITAMIN) tablet Take 1 tablet by mouth daily.     Yes Historical Provider, MD  naproxen (NAPROSYN) 500 MG tablet  Take 1 tablet (500 mg total) by mouth 2 (two) times daily with a meal. 09/03/13  Yes Lisette Abu, PA-C  simvastatin (ZOCOR) 40 MG tablet Take 20 mg by mouth daily.   Yes Historical Provider, MD  warfarin (COUMADIN) 2 MG tablet Take 2-4 mg by mouth daily. 2mg  daily except 4mg  on Sunday   Yes Historical Provider, MD   Physical Exam: Filed Vitals:   09/10/13 0657 09/10/13 0730 09/10/13 0800 09/10/13 0900  BP: 112/47 108/58 112/55 124/56  Pulse:  125 95   Temp:      TempSrc:      Resp: 21 25 21 30   Height:      Weight:      SpO2: 93% 97% 97%      General:  Thin WF, NAD  Eyes:  PERRL, anicteric, non-injected.  ENT:  Nares clear.  OP with blood staining, non-erythematous without plaques or exudates.  Dry lips.  MMM but eating ice chips.  Neck:  Supple without TM or JVD.    Lymph:  No cervical, supraclavicular, or submandibular LAD.  Cardiovascular:  IRRR and tachycardic, normal S1, S2, without m/r/g.  2+ pulses, warm extremities  Respiratory:  Diminished with rales at bilateral bases and faint wheeze, no rhonchi, no increased WOB.  Abdomen:  NABS.  Soft, mildly distended, NT.    Skin:  No rashes or focal lesions.  Musculoskeletal:  Normal bulk and tone.  No LE edema.  Psychiatric:  A & O x 4.  Appropriate affect.  Neurologic:  CN 3-12 intact.  5-/5 strength.  Sensation intact.  Labs on Admission:  Basic Metabolic Panel:  Recent Labs Lab 09/10/13 0449  NA 139  K 4.5  CL 98  CO2 30  GLUCOSE 163*  BUN 28*  CREATININE 0.88  CALCIUM 9.1   Liver Function Tests:  Recent Labs Lab 09/10/13 0449  AST 19  ALT 15  ALKPHOS 105  BILITOT 0.3  PROT 6.0  ALBUMIN 2.4*   No results found for this basename: LIPASE, AMYLASE,  in the last 168 hours No results found for this basename: AMMONIA,  in the last 168 hours CBC:  Recent Labs Lab 09/10/13 0449  WBC 17.9*  NEUTROABS 15.3*  HGB 10.1*  HCT 30.3*  MCV 95.0  PLT 444*   Cardiac Enzymes: No results found  for this basename: CKTOTAL, CKMB, CKMBINDEX, TROPONINI,  in the last 168 hours  BNP (last 3 results) No results found for this basename: PROBNP,  in the last 8760 hours CBG: No results found for this basename: GLUCAP,  in the last 168 hours  Radiological Exams on Admission: Ct Abdomen Pelvis W Contrast  09/10/2013   CLINICAL DATA:  Elevated white blood cell count.  Abdominal distention. Tarry stools  EXAM: CT ABDOMEN AND PELVIS WITH CONTRAST  TECHNIQUE: Multidetector CT imaging of the abdomen and pelvis was performed using the standard protocol following bolus administration of intravenous contrast.  CONTRAST:  156mL OMNIPAQUE IOHEXOL 300 MG/ML  SOLN  COMPARISON:  Chest radiograph 09/02/2013  FINDINGS: Left lateral rib fractures again noted. There bilateral pleural effusions greater on the left. There is bibasilar atelectasis associated these moderate effusions. No pericardial fluid.  There is no focal hepatic lesion. Gallbladder is not identified. Pancreas, spleen, adrenal glands, kidneys are normal. There is significant streak artifact through the abdomen from the posterior lumbar fixation.  Stomach, small bowel, and cecum are normal. Appendix is not identified. The sigmoid colon has multiple diverticula. No acute diverticulitis.  Abdominal aorta is normal caliber with intimal calcification. No retroperitoneal periportal lymphadenopathy.  No free fluid in the pelvis. There is a 5.7 cm cyst associated with the right ovary. Post hysterectomy anatomy. Bladder is normal. No aggressive osseous lesion. Posterior lumbar fusion again noted.  IMPRESSION: 1. No acute abdominal or pelvic findings. No explanation for elevated white blood cell count. 2. Bilateral moderate to large pleural effusions with associated atelectasis. 3. Posterior left rib fractures. 4. Large benign appearing cyst associated with the right ovary. In late postmenopausal female, recommend abdominal ultrasound further evaluation. This  recommendation follows ACR consensus guidelines: White Paper of the ACR Incidental Findings Committee II on Adnexal Findings. J Am Coll Radiol [phone].   Electronically Signed   By: Suzy Bouchard M.D.   On: 09/10/2013 08:36    EKG: Independently reviewed. A-fib, rate in low 100s  Assessment/Plan Active Problems:   GI bleed  ---  Upper GIB likely due to gastritis or peptic ulcer from naproxen  -  Protonix gtt -  CLD -  hgb q6 hour for now -  Consider GI consult  -  Vitamin K given in ER -  Hold coumadin  -  Dr. Cristina Gong is primary GI MD  Mild hypotension -  Hold BP medications -  As needed IVF  Acute hypoxic respiratory failure likely secondary to bilateral pleural effusions noted on CT which developed post-rib fractures during last admission.  Mild hypoalbuminemia -  CXR -  Consider diagnostic thoracentesis once INR < 1.6 -  D/c IVF unless needed for blood pressure and consider diuresis if BP improve -  ECHO -  IS  Ovarian cyst, will need pelvic US eventually.  Hold due to acute illness   Rib fractures -  Tylenol and narcotics as needed for pain  Recurrent falls -  PT/OT  A-fib, tachycardic.  Consider digoxin temporarily for rate control while borderline hypotensive -  Hold warfarin -  Consider FFP if needed -  Continue vitamin K -  Recommend against resume warfarin anytime soon  HLD, stable, continue statin  Peripheral neuropathy, stable, continue tylenol prn  Anxiety, stable continue prn xanax  Leukocytosis likely reactive from acute anemia -  CXR -  UA  Acute blood loss anemia -  Transfuse for symptomatic anemia or hgb < 7  Thrombocytosis, likely reactive from GIB -  Trend platelets  Diet:  CLD Access:  PIV IVF:  off Proph:  SCD  Code Status: Full (but will confer with sons and living will) Family Communication: patient and her medical assistant Disposition Plan: Admit to telemetry  Time spent: 60 min Janece Canterbury [facility] Pager [phone]  If 7PM-7AM, please contact night-coverage [email or web address] Password [facility] 09/10/2013, 9:45 AM

## 2013-09-10 NOTE — ED Provider Notes (Signed)
CSN: [id number]     Arrival date & time 09/10/13  0407 History   First MD Initiated Contact with Patient 09/10/13 [phone]     Chief Complaint  Patient presents with  . GI Bleeding     (Consider location/radiation/quality/duration/timing/severity/associated sxs/prior Treatment) HPI 80 year old female presents to the emergency department via EMS from nursing facility with complaint of onset of nausea vomiting of dark black material and passage of dark melenic stools around 2 AM today.  Over the last few days, patient is complaining of abdominal distention and pain.  Patient has been having bowel movements.  Patient recently discharged from the hospital on August 25 after having a fall with 5 rib fractures on the left side.  Patient is on Coumadin for A. fib, this was held and reversed with vitamin K secondary to drop in hemoglobin and hematocrit.  She has been restarted on her Coumadin.  No prior history of GI bleed. Past Medical History  Diagnosis Date  . Atrial fibrillation   . Neuropathy     PERIPHERAL  . Carotid artery occlusion     LEFT  . Scoliosis   . Hoarseness   . Headache(784.0)   . Dizziness   . Abdominal bloating   . Bruises easily   . Pruritus   . Hypercholesterolemia   . Fall at home Sept 2013, Dec. 2013  Jun 08, 2012  . Varicose veins    Past Surgical History  Procedure Laterality Date  . Gallbladder surgery    . Abdominal hysterectomy  1954  . Spine surgery  1997  . Cholecystectomy  1997    GalL Bladder   Family History  Problem Relation Age of Onset  . Heart disease    . Diabetes Mother   . Heart attack Mother   . Heart disease Mother   . Heart attack Father   . Stroke Father   . Cancer Sister   . Heart disease Maternal Grandmother    History  Substance Use Topics  . Smoking status: Never Smoker   . Smokeless tobacco: Never Used  . Alcohol Use: No   OB History   Grav Para Term Preterm Abortions TAB SAB Ect Mult Living                 Review of  Systems   See History of Present Illness; otherwise all other systems are reviewed and negative  Allergies  Tape  Home Medications   Prior to Admission medications   Medication Sig Start Date End Date Taking? Authorizing Provider  acetaminophen (TYLENOL) 160 MG/5ML solution Take 31.3 mLs (1,000 mg total) by mouth every 6 (six) hours as needed (Pain). 09/03/13  Yes Lisette Abu, PA-C  ALPRAZolam Duanne Moron) 0.25 MG tablet Take 0.25 mg by mouth at bedtime as needed for anxiety.   Yes Historical Provider, MD  atenolol (TENORMIN) 25 MG tablet Take 25 mg by mouth 2 (two) times daily.   Yes Historical Provider, MD  Calcium-Vitamin D-Vitamin K (VIACTIV PO) Take 1 tablet by mouth daily.    Yes Historical Provider, MD  Multiple Vitamin (MULTIVITAMIN) tablet Take 1 tablet by mouth daily.     Yes Historical Provider, MD  naproxen (NAPROSYN) 500 MG tablet Take 1 tablet (500 mg total) by mouth 2 (two) times daily with a meal. 09/03/13  Yes Lisette Abu, PA-C  simvastatin (ZOCOR) 40 MG tablet Take 20 mg by mouth daily.   Yes Historical Provider, MD  warfarin (COUMADIN) 2 MG tablet Take 2-4 mg  by mouth daily. 2mg  daily except 4mg  on Sunday   Yes Historical Provider, MD   BP 94/61  Temp(Src) 98.4 F (36.9 C) (Oral)  Resp 25  Ht 5\' 2"  (1.575 m)  Wt 112 lb (50.803 kg)  BMI 20.48 kg/m2  SpO2 93% Physical Exam  Nursing note and vitals reviewed. Constitutional: She is oriented to person, place, and time. She appears well-developed and well-nourished.  HENT:  Head: Normocephalic and atraumatic.  Nose: Nose normal.  Mouth/Throat: Oropharynx is clear and moist.  Eyes: Conjunctivae and EOM are normal. Pupils are equal, round, and reactive to light.  Neck: Normal range of motion. Neck supple. No JVD present. No tracheal deviation present. No thyromegaly present.  Cardiovascular: Normal rate, regular rhythm, normal heart sounds and intact distal pulses.  Exam reveals no gallop and no friction rub.    No murmur heard. Pulmonary/Chest: Effort normal and breath sounds normal. No stridor. No respiratory distress. She has no wheezes. She has no rales. She exhibits tenderness (tenderness over the left rib cage).  Abdominal: Soft. She exhibits no distension and no mass. There is tenderness. There is no rebound and no guarding.  Hypoactive bowel sounds.  Patient has diffuse tenderness to palpation worse in left upper quadrant.  Musculoskeletal: Normal range of motion. She exhibits no edema and no tenderness.  Lymphadenopathy:    She has no cervical adenopathy.  Neurological: She is alert and oriented to person, place, and time. She exhibits normal muscle tone. Coordination normal.  Skin: Skin is warm and dry. No rash noted. No erythema. No pallor.  Psychiatric: She has a normal mood and affect. Her behavior is normal. Judgment and thought content normal.    ED Course  Procedures (including critical care time) Labs Review Labs Reviewed  CBC WITH DIFFERENTIAL - Abnormal; Notable for the following:    WBC 17.9 (*)    RBC 3.19 (*)    Hemoglobin 10.1 (*)    HCT 30.3 (*)    Platelets 444 (*)    Neutrophils Relative % 86 (*)    Neutro Abs 15.3 (*)    Lymphocytes Relative 5 (*)    Monocytes Absolute 1.4 (*)    All other components within normal limits  PROTIME-INR - Abnormal; Notable for the following:    Prothrombin Time 31.3 (*)    INR 3.02 (*)    All other components within normal limits  COMPREHENSIVE METABOLIC PANEL  I-STAT CG4 LACTIC ACID, ED  POC OCCULT BLOOD, ED  SAMPLE TO BLOOD BANK    Imaging Review No results found.   EKG Interpretation None      Date: 09/10/2013  Rate: 105  Rhythm: atrial fibrillation  QRS Axis: normal  Intervals: afib  ST/T Wave abnormalities: normal  Conduction Disutrbances:afib  Narrative Interpretation:   Old EKG Reviewed: unchanged    MDM   Final diagnoses:  None   80 year old female history of A. fib on Coumadin, recent falls with  left-sided rib fractures who presents with possible GI bleed with vomiting and stool with dark color.  Plan for labs, Protonix, CT abdomen pelvis.  Pt to receive vit K for elevated inr, slight drop in h/h and melenotic stools.  Awaiting CT scan.  Care passed to Dr Tamera Punt, will need admission.  Pt is a [facility] patient.    Kalman Drape, MD 09/11/13 [phone]

## 2013-09-10 NOTE — Progress Notes (Signed)
UR completed 

## 2013-09-10 NOTE — Progress Notes (Signed)
  Echocardiogram 2D Echocardiogram has been performed.  Vanessa [name] 09/10/2013, 3:43 PM

## 2013-09-10 NOTE — ED Notes (Signed)
Admitting MD at bedside.

## 2013-09-10 NOTE — Consult Note (Signed)
PULMONARY / CRITICAL CARE MEDICINE   Name: Vanessa [name] MRN: [id number] DOB: 1920/06/22    ADMISSION DATE:  09/10/2013 CONSULTATION DATE:  09/10/2013  REFERRING MD :  Sheran Fava  CHIEF COMPLAINT:  GI bleed  INITIAL PRESENTATION: 80 year old female presented 8/31 to [facility] ED for dark stools and coffee ground emesis. She takes coumadin for AF. In ED hemoglobin was stable and she was admitted to tele. Now hemoglobin 7. She is being moved to ICU, PCCM asked to see.   STUDIES:  CT abd 8/31 > No acute abdominal or pelvic findings. Bilateral moderate to large pleural effusions with associated atelectasis.Posterior left rib fractures. Large benign appearing cyst associated with the right ovary.   SIGNIFICANT EVENTS: 8/30 admitted 8/31 hgb 7, to ICU. Transfused.  HISTORY OF PRESENT ILLNESS:  80 y.o. femalewhich includes a-fib on chronic anticoagulation, carotid artery occlusion, and recurrent falls .   The patient was admitted from 8/18 to 8/24 after a fall which resulted in multiple rib fractures. Her coumadin was stopped and reversed at that time but resumed at discharge. She has had abdominal bloating x 1 week. She now presents with hematemesis and melena.  8/30 she developed dark tarry stools and coffee ground emesis, multiple episodes. She presented to ED 8/31. She was stable and admitted to tele. 8/31 PM hemoglobin continued to fall (7) with bp/hr instability but improved with fluids. . She was moved to ICU and PCCM asked to see and time of evaluation had not received any blood products and has difficult access. Of note, CXR at admission 09/10/2013 showed new left pleural effusion since 08/30/13  compared to 08/28/13 when she was admitted with fractures on same side   PAST MEDICAL HISTORY :  Past Medical History  Diagnosis Date  . Atrial fibrillation   . Neuropathy     PERIPHERAL  . Carotid artery occlusion     LEFT  . Scoliosis   . Hoarseness   . Headache(784.0)   . Dizziness   . Abdominal  bloating   . Bruises easily   . Pruritus   . Hypercholesterolemia   . Fall at home Sept 2013, Dec. 2013  Jun 08, 2012  . Varicose veins    Past Surgical History  Procedure Laterality Date  . Gallbladder surgery    . Abdominal hysterectomy  1954  . Spine surgery  1997  . Cholecystectomy  1997    GalL Bladder   Prior to Admission medications   Medication Sig Start Date End Date Taking? Authorizing Provider  acetaminophen (TYLENOL) 160 MG/5ML solution Take 31.3 mLs (1,000 mg total) by mouth every 6 (six) hours as needed (Pain). 09/03/13  Yes Lisette Abu, PA-C  ALPRAZolam Duanne Moron) 0.25 MG tablet Take 0.25 mg by mouth at bedtime as needed for anxiety.   Yes Historical Provider, MD  atenolol (TENORMIN) 25 MG tablet Take 25 mg by mouth 2 (two) times daily.   Yes Historical Provider, MD  Calcium-Vitamin D-Vitamin K (VIACTIV PO) Take 1 tablet by mouth daily.    Yes Historical Provider, MD  Multiple Vitamin (MULTIVITAMIN) tablet Take 1 tablet by mouth daily.     Yes Historical Provider, MD  naproxen (NAPROSYN) 500 MG tablet Take 1 tablet (500 mg total) by mouth 2 (two) times daily with a meal. 09/03/13  Yes Lisette Abu, PA-C  simvastatin (ZOCOR) 40 MG tablet Take 20 mg by mouth daily.   Yes Historical Provider, MD  warfarin (COUMADIN) 2 MG tablet Take 2-4 mg by  mouth daily. 2mg  daily except 4mg  on Sunday   Yes Historical Provider, MD   Allergies  Allergen Reactions  . Tape Other (See Comments)    Tears skin. Band-Aids    FAMILY HISTORY:  Family History  Problem Relation Age of Onset  . Heart disease    . Diabetes Mother   . Heart attack Mother   . Heart disease Mother   . Heart attack Father   . Stroke Father   . Cancer Sister   . Heart disease Maternal Grandmother    SOCIAL HISTORY:  reports that she has never smoked. She has never used smokeless tobacco. She reports that she does not drink alcohol or use illicit drugs.  REVIEW OF SYSTEMS:   Bolds are positive   Constitutional: weight loss, gain, night sweats, Fevers, chills, fatigue .  HEENT: headaches, Sore throat, sneezing, nasal congestion, post nasal drip, Difficulty swallowing, Tooth/dental problems, visual complaints visual changes, ear ache CV:  chest pain, radiates: ,Orthopnea, PND, swelling in lower extremities, dizziness, palpitations, syncope.  GI  heartburn, indigestion, abdominal pain, nausea, vomiting, melena diarrhea, change in bowel habits, loss of appetite. Resp: cough, productive: , hemoptysis, dyspnea, chest pain, pleuritic.  Skin: rash or itching or icterus GU: dysuria, change in color of urine, urgency or frequency. flank pain, hematuria  MS: joint pain or swelling. decreased range of motion  Psych: change in mood or affect. depression or anxiety.  Neuro: difficulty with speech, weakness, numbness, ataxia     VITAL SIGNS: Temp:  [98.4 F (36.9 C)-99.2 F (37.3 C)] 99.2 F (37.3 C) (08/31 1400) Pulse Rate:  [91-125] 91 (08/31 1440) Resp:  [20-30] 20 (08/31 1400) BP: (82-124)/(47-80) 108/49 mmHg (08/31 1440) SpO2:  [89 %-98 %] 96 % (08/31 1400) Weight:  [50.803 kg (112 lb)] 50.803 kg (112 lb) (08/31 1053) HEMODYNAMICS:   VENTILATOR SETTINGS:   INTAKE / OUTPUT:  Intake/Output Summary (Last 24 hours) at 09/10/13 1756 Last data filed at 09/10/13 1702  Gross per 24 hour  Intake      0 ml  Output    585 ml  Net   -585 ml    PHYSICAL EXAMINATION: General:  Elderly female in NAD Neuro:  Alert, oriented. Responds appropriately HEENT:  [place]/AT, no JVD noted Cardiovascular:  RRR Lungs:  Resp's even, unlabored. Diminished L>R Abdomen:  Soft, non-tender, mildly distended, hyperactive BS.  Musculoskeletal:  No acute deformity Skin:  Intact  LABS: PULMONARY No results found for this basename: PHART, PCO2, PCO2ART, PO2, PO2ART, HCO3, TCO2, O2SAT,  in the last 168 hours  CBC  Recent Labs Lab 09/10/13 0449 09/10/13 1152 09/10/13 1711  HGB 10.1* 8.4* 7.0*  HCT  30.3* 25.1* 21.2*  WBC 17.9*  --   --   PLT 444*  --   --     COAGULATION  Recent Labs Lab 09/10/13 0449  INR 3.02*    CARDIAC  No results found for this basename: TROPONINI,  in the last 168 hours No results found for this basename: PROBNP,  in the last 168 hours   CHEMISTRY  Recent Labs Lab 09/10/13 0449  NA 139  K 4.5  CL 98  CO2 30  GLUCOSE 163*  BUN 28*  CREATININE 0.88  CALCIUM 9.1   Estimated Creatinine Clearance: 32.9 ml/min (by C-G formula based on Cr of 0.88).   LIVER  Recent Labs Lab 09/10/13 0449  AST 19  ALT 15  ALKPHOS 105  BILITOT 0.3  PROT 6.0  ALBUMIN 2.4*  INR  3.02*     INFECTIOUS  Recent Labs Lab 09/10/13 0507  LATICACIDVEN 1.41     ENDOCRINE CBG (last 3)  No results found for this basename: GLUCAP,  in the last 72 hours       IMAGING x48h Ct Abdomen Pelvis W Contrast  09/10/2013   CLINICAL DATA:  Elevated white blood cell count. Abdominal distention. Tarry stools  EXAM: CT ABDOMEN AND PELVIS WITH CONTRAST  TECHNIQUE: Multidetector CT imaging of the abdomen and pelvis was performed using the standard protocol following bolus administration of intravenous contrast.  CONTRAST:  171mL OMNIPAQUE IOHEXOL 300 MG/ML  SOLN  COMPARISON:  Chest radiograph 09/02/2013  FINDINGS: Left lateral rib fractures again noted. There bilateral pleural effusions greater on the left. There is bibasilar atelectasis associated these moderate effusions. No pericardial fluid.  There is no focal hepatic lesion. Gallbladder is not identified. Pancreas, spleen, adrenal glands, kidneys are normal. There is significant streak artifact through the abdomen from the posterior lumbar fixation.  Stomach, small bowel, and cecum are normal. Appendix is not identified. The sigmoid colon has multiple diverticula. No acute diverticulitis.  Abdominal aorta is normal caliber with intimal calcification. No retroperitoneal periportal lymphadenopathy.  No free fluid in the  pelvis. There is a 5.7 cm cyst associated with the right ovary. Post hysterectomy anatomy. Bladder is normal. No aggressive osseous lesion. Posterior lumbar fusion again noted.  IMPRESSION: 1. No acute abdominal or pelvic findings. No explanation for elevated white blood cell count. 2. Bilateral moderate to large pleural effusions with associated atelectasis. 3. Posterior left rib fractures. 4. Large benign appearing cyst associated with the right ovary. In late postmenopausal female, recommend abdominal ultrasound further evaluation. This recommendation follows ACR consensus guidelines: White Paper of the ACR Incidental Findings Committee II on Adnexal Findings. J Am Coll Radiol [phone].   Electronically Signed   By: Suzy Bouchard M.D.   On: 09/10/2013 08:36   Dg Chest Port 1 View  09/10/2013   CLINICAL DATA:  Recent left-sided rib fractures.  EXAM: PORTABLE CHEST - 1 VIEW  COMPARISON:  09/02/2013.  FINDINGS: Interval increase in left-sided pleural effusion noted with fluid now seen in the left apex. Ms. raises the question of a degree of loculation. Numerous left-sided rib fractures are evident. There is right base atelectasis with probable small right pleural effusion associated. Interstitial markings are diffusely coarsened with chronic features. The cardiopericardial silhouette is within normal limits for size. Bones are diffusely demineralized. Telemetry leads overlie the chest.  IMPRESSION: Interval increase in left-sided pleural effusion which is now moderate to large in size and may be loculated. No evidence for associated pneumothorax.  Persistent right base atelectasis with small right pleural effusion.   Electronically Signed   By: Misty Stanley M.D.   On: 09/10/2013 11:02     ASSESSMENT / PLAN:  PULMONARY A: Bilateral pleural effusions L>R since 08/30/13 following left rib fracture on 08/28/13  - since then dyspnea and pain   - suspect left sided new hemothorax  P:    Supplemental O2 as needed to keep SpO2 > 92% Consider diagnostic thora once coagulopathy resolves. And depending on result possibly chest tube Follow CXR  CARDIOVASCULAR CVL A:  Hypotension 2nd to hypovolemia and acute blood loss (GIB) Chronic AF on coumadin  P:  IVF hydration Transfuse per ICU guidelines Check lactic acid Place CVL  RENAL A:   No acute issues but at risk for ATN  P:   Follow Bmet  GASTROINTESTINAL A:  Acute upper GI bleed - has melena  P:   Protonix gtt Transfuse as below  GI following, will scope once coagulopathy is corrected. May need sooner GI intervention if bp/hr instability get worse NPO  HEMATOLOGIC A:   Acute blood loss anemia 2nd to GIB Coumadin coagulopathy (Vit K given in ED)  P:  Hold coumadin Transfuse per normal ICU guidelines 2 units PRBC, 1 unit FFP now Trend H&H, q4h Trend Coags PRBC for hgb < 7gm% or unless there is hemodynamic instability  INFECTIOUS A:   SIRS, no obvious source  P:   UC 8/31 >>> Monitor clinically   ENDOCRINE A:   No acute issues    P:   Monitor  NEUROLOGIC A:   Acute encephalopathy, lethargy  P:   RASS goal: 0 Monitor   Family 09/10/13: Updated by NP Celene Skeen, ACNP [place] [facility] Pager [phone] or [phone]    STAFF NOTE: I, Dr Ann Lions have personally reviewed patient's available data, including medical history, events of note, physical examination and test results as part of my evaluation. I have discussed with resident/NP and other care providers such as pharmacist, RN and RRT.  In addition,  I personally evaluated patient and elicited key findings of  A) acute GI bleed with 3g% hgb drop in 12h and transient bp/hr instability resuscitated with fluids. CVL placed by [place] and will give PRBC/FFP. D/w Dr Watt Climes of GI: if continued bp/hr instability or active bleed - [facility] will scope tonight. B) new left pleural effusion  - suspect  hemothorax but stable since 08/30/13. Will need thora once coags corrected and depending on results chest tube.  Rest per NP/medical resident whose note is outlined above and that I agree with  The patient is critically ill with multiple organ systems failure and requires high complexity decision making for assessment and support, frequent evaluation and titration of therapies, application of advanced monitoring technologies and extensive interpretation of multiple databases.   Critical Care Time devoted to patient care services described in this note is  45  Minutes.  Dr. Brand Males, M.D., [facility].C.P Pulmonary and Critical Care Medicine Staff Physician [place] [facility] Pager: [phone], If no answer or between  15:00h - 7:00h: call [phone]  09/10/2013 7:36 PM

## 2013-09-10 NOTE — Procedures (Signed)
Central Venous Catheter Insertion Procedure Note CARALEE MOREA [id number] 08/15/1920  Procedure: Insertion of Central Venous Catheter Indications: Drug and/or fluid administration  Procedure Details Consent: Risks of procedure as well as the alternatives and risks of each were explained to the (patient/caregiver).  Consent for procedure obtained. Time Out: Verified patient identification, verified procedure, site/side was marked, verified correct patient position, special equipment/implants available, medications/allergies/relevent history reviewed, required imaging and test results available.  Performed  Maximum sterile technique was used including antiseptics, cap, gloves, gown, hand hygiene, mask and sheet. Skin prep: Chlorhexidine; local anesthetic administered A antimicrobial bonded/coated triple lumen catheter was placed in the left internal jugular vein using the Seldinger technique. Ultrasound guidance used.Yes.   Catheter placed to 18 cm. Blood aspirated via all 3 ports and then flushed x 3. Line sutured x 2 and dressing applied.  Evaluation Blood flow good Complications: No apparent complications Patient did tolerate procedure well. Chest X-ray ordered to verify placement.  CXR: pending.  Georgann Housekeeper, ACNP [place] [facility] Pager [phone] or [phone]    STAFF NOTE   Supervised procedure   Real time 2D ultrasound used for vein site selection, patency assessment, and needle entry. A record of image was made but could not be submitted for filing due to malfunction of printing device  Dr. Brand Males, M.D., [facility].C.P Pulmonary and Critical Care Medicine Staff Physician [place] [facility] Pager: [phone], If no answer or between  15:00h - 7:00h: call [phone]  09/10/2013 7:56 PM

## 2013-09-10 NOTE — ED Notes (Signed)
Bed: PJ12 Expected date:  Expected time:  Means of arrival:  Comments: EMS 31F GIB

## 2013-09-11 ENCOUNTER — Inpatient Hospital Stay (HOSPITAL_COMMUNITY): Payer: Medicare Other

## 2013-09-11 DIAGNOSIS — R5381 Other malaise: Secondary | ICD-10-CM

## 2013-09-11 DIAGNOSIS — Z515 Encounter for palliative care: Secondary | ICD-10-CM

## 2013-09-11 DIAGNOSIS — R5383 Other fatigue: Secondary | ICD-10-CM

## 2013-09-11 DIAGNOSIS — F411 Generalized anxiety disorder: Secondary | ICD-10-CM

## 2013-09-11 LAB — PREPARE FRESH FROZEN PLASMA: Unit division: 0

## 2013-09-11 LAB — CBC WITH DIFFERENTIAL/PLATELET
Basophils Absolute: 0 10*3/uL (ref 0.0–0.1)
Basophils Relative: 0 % (ref 0–1)
Eosinophils Absolute: 0.3 10*3/uL (ref 0.0–0.7)
Eosinophils Relative: 2 % (ref 0–5)
HCT: 28.3 % — ABNORMAL LOW (ref 36.0–46.0)
Hemoglobin: 9.6 g/dL — ABNORMAL LOW (ref 12.0–15.0)
Lymphocytes Relative: 15 % (ref 12–46)
Lymphs Abs: 1.9 10*3/uL (ref 0.7–4.0)
MCH: 31.2 pg (ref 26.0–34.0)
MCHC: 33.9 g/dL (ref 30.0–36.0)
MCV: 91.9 fL (ref 78.0–100.0)
Monocytes Absolute: 1.4 10*3/uL — ABNORMAL HIGH (ref 0.1–1.0)
Monocytes Relative: 11 % (ref 3–12)
Neutro Abs: 9.5 10*3/uL — ABNORMAL HIGH (ref 1.7–7.7)
Neutrophils Relative %: 73 % (ref 43–77)
Platelets: 258 10*3/uL (ref 150–400)
RBC: 3.08 MIL/uL — ABNORMAL LOW (ref 3.87–5.11)
RDW: 16.3 % — ABNORMAL HIGH (ref 11.5–15.5)
WBC: 13 10*3/uL — ABNORMAL HIGH (ref 4.0–10.5)

## 2013-09-11 LAB — HEMOGLOBIN AND HEMATOCRIT, BLOOD
HCT: 31.4 % — ABNORMAL LOW (ref 36.0–46.0)
HCT: 28 % — ABNORMAL LOW (ref 36.0–46.0)
Hemoglobin: 10.6 g/dL — ABNORMAL LOW (ref 12.0–15.0)
Hemoglobin: 9.8 g/dL — ABNORMAL LOW (ref 12.0–15.0)

## 2013-09-11 LAB — PROTIME-INR
INR: 1.27 (ref 0.00–1.49)
Prothrombin Time: 15.9 seconds — ABNORMAL HIGH (ref 11.6–15.2)

## 2013-09-11 LAB — BASIC METABOLIC PANEL
Anion gap: 9 (ref 5–15)
BUN: 30 mg/dL — ABNORMAL HIGH (ref 6–23)
CO2: 27 mEq/L (ref 19–32)
Calcium: 8.5 mg/dL (ref 8.4–10.5)
Chloride: 106 mEq/L (ref 96–112)
Creatinine, Ser: 0.75 mg/dL (ref 0.50–1.10)
GFR calc Af Amer: 83 mL/min — ABNORMAL LOW (ref >90)
GFR calc non Af Amer: 72 mL/min — ABNORMAL LOW (ref >90)
Glucose, Bld: 98 mg/dL (ref 70–99)
Potassium: 4 mEq/L (ref 3.7–5.3)
Sodium: 142 mEq/L (ref 137–147)

## 2013-09-11 LAB — URINE CULTURE: Colony Count: 50000

## 2013-09-11 LAB — APTT: aPTT: 32 seconds (ref 24–37)

## 2013-09-11 LAB — PHOSPHORUS: Phosphorus: 2.6 mg/dL (ref 2.3–4.6)

## 2013-09-11 LAB — TROPONIN I: Troponin I: <0.3 ng/mL (ref <0.30)

## 2013-09-11 LAB — MAGNESIUM: Magnesium: 1.9 mg/dL (ref 1.5–2.5)

## 2013-09-11 MED ORDER — FUROSEMIDE 10 MG/ML IJ SOLN
40.0000 mg | Freq: Once | INTRAMUSCULAR | Status: AC
  Administered 2013-09-11: 40 mg via INTRAVENOUS

## 2013-09-11 MED ORDER — MORPHINE SULFATE 2 MG/ML IJ SOLN
1.0000 mg | INTRAMUSCULAR | Status: DC | PRN
  Administered 2013-09-11 (×2): 2 mg via INTRAVENOUS
  Filled 2013-09-11: qty 1

## 2013-09-11 MED ORDER — FUROSEMIDE 10 MG/ML IJ SOLN
40.0000 mg | Freq: Once | INTRAMUSCULAR | Status: AC
  Administered 2013-09-11: 40 mg via INTRAVENOUS
  Filled 2013-09-11: qty 4

## 2013-09-11 MED ORDER — ONDANSETRON HCL 4 MG/2ML IJ SOLN
4.0000 mg | Freq: Four times a day (QID) | INTRAMUSCULAR | Status: DC | PRN
  Administered 2013-09-11: 4 mg via INTRAVENOUS

## 2013-09-11 MED ORDER — PANTOPRAZOLE SODIUM 40 MG IV SOLR
40.0000 mg | Freq: Two times a day (BID) | INTRAVENOUS | Status: DC
Start: 2013-09-14 — End: 2013-09-13

## 2013-09-11 MED ORDER — FUROSEMIDE 10 MG/ML IJ SOLN
INTRAMUSCULAR | Status: AC
  Filled 2013-09-11: qty 4

## 2013-09-11 MED ORDER — ONDANSETRON HCL 4 MG/2ML IJ SOLN
INTRAMUSCULAR | Status: AC
  Filled 2013-09-11: qty 2

## 2013-09-11 MED ORDER — SODIUM CHLORIDE 0.9 % IV SOLN
8.0000 mg/h | INTRAVENOUS | Status: DC
  Administered 2013-09-11 – 2013-09-13 (×5): 8 mg/h via INTRAVENOUS
  Filled 2013-09-11 (×11): qty 80

## 2013-09-11 MED ORDER — FUROSEMIDE 10 MG/ML IJ SOLN
20.0000 mg | Freq: Once | INTRAMUSCULAR | Status: AC
  Administered 2013-09-11: 20 mg via INTRAVENOUS
  Filled 2013-09-11: qty 2

## 2013-09-11 MED ORDER — MORPHINE SULFATE 2 MG/ML IJ SOLN
INTRAMUSCULAR | Status: AC
  Filled 2013-09-11: qty 1

## 2013-09-11 NOTE — Progress Notes (Addendum)
[facility] PROGRESS NOTE  Vanessa [name] GEX:[id number] DOB: December 15, 1920 DOA: 09/10/2013 PCP: Ellsworth Lennox, MD  Assessment/Plan  Upper GIB likely due to gastritis or peptic ulcer from naproxen, hgb now increased to 9.6 after blood transfusion - resume protonix infusion now that IV access established - NPO - hgb q6 hour for now  - Appreciate GI assistance - Vitamin K given in ER  - continue to hold coumadin   Mild hypotension, now resolved after blood transfusions - Hold BP medications   Acute hypoxic respiratory failure likely secondary to bilateral pleural effusions noted on CT which developed post-rib fractures during last admission. Mild hypoalbuminemia.  Placed on bipap overnight and received lasix. - CXR with left greater than right pleural effusions and airspace consolidations, possible pneumonia - Consider diagnostic thoracentesis once INR < 1.6  - ECHO:  EF 60-65% with normal wall motion and difficult to discern diastolic dysfunction - IS  -  Lasix 40mg  IV once:  Responded well but still on 4L so will redose lasix 40mg  IV once now (4PM)  Ovarian cyst, will need pelvic US eventually. Hold due to acute illness   Rib fractures, still in pain - IV morphine as needed   Recurrent falls  - PT/OT if she clinically improves  A-fib, Tele:  A-fib around 100bpm -  Consider digoxin temporarily for rate control  - Hold warfarin  - received 1 unit FFP - Continue vitamin K  - Recommend against resuming warfarin anytime soon   HLD, stable, continue statin   Peripheral neuropathy, stable, continue tylenol prn   Anxiety, stable continue prn xanax   Leukocytosis likely reactive from acute anemia  - CXR:  Possible pneumonia on repeat CXR (although more likely due to pulmonary edema) - UA:  Neg  Acute blood loss anemia, improved with blood transfusions - Transfuse for symptomatic anemia or hgb < 7   Thrombocytosis, likely reactive from GIB, resolved.  Diet:   NPO Access:  PIV IVF:  off Proph:  SCD  Code Status: DNR/DNI Family Communication: patient and her two sons.  We discussed code status at length and patient and family are in agreement about do not resuscitate because of pain from already existing rib fractures and her large left pleural effusion.  They were also in agreement about do not intubate.  Explained bipap mask can be life saving but some people do not tolerate the mask.  The alternative is using medications like morphine to treat symptoms of SOB but they do not treat the underlying problem and this can lead to death.  Patient repeatedly asked for face mask to be removed.  Sons would still like her to recover and are worried about what will happen when the mask is removed.  They asked if the preacher should come and I told them yes.  I assured the patient that she ultimately has the final say in her care and her sons share her healthcare POA and if she wants to try the mask again she can.   Disposition Plan: continue SDU   Consultants:  GI, Dr. Penelope Coop  PCCM, Dr. Chase Caller  Procedures:  CVC left IJ on 8/31  Bipap trial 9/1  Antibiotics:  None   HPI/Subjective:  Respiratory distress overnight and had bipap mask placed.  Wants face mask off.  Has some abdominal tenderness and nausea.  No Bms since yesterday and has not vomited.  Has posterior left rib pain.    Objective: Filed Vitals:   09/11/13 0400 09/11/13 0500 09/11/13 0600  09/11/13 0700  BP: 118/51 132/50 153/86 144/68  Pulse: 88 91 103 112  Temp: 98.1 F (36.7 C) 98.2 F (36.8 C) 98.2 F (36.8 C)   TempSrc: Axillary Axillary    Resp: 19 28 31 27   Height:      Weight:      SpO2: 95% 99% 97% 99%    Intake/Output Summary (Last 24 hours) at 09/11/13 0746 Last data filed at 09/11/13 0600  Gross per 24 hour  Intake 1168.75 ml  Output   1635 ml  Net -466.25 ml   Filed Weights   09/10/13 0417 09/10/13 1053  Weight: 50.803 kg (112 lb) 50.803 kg (112 lb)     Exam:   General:  Thin WF, clearly uncomfortable with face mask in place  HEENT:  NCAT, cracked lips  Cardiovascular:  IRRR, nl S1, S2 no mrg, 2+ pulses, warm extremities  Respiratory:  bipap in place.  Diminished at left base, no obvious rales heard, no increased WOB  Abdomen:   Hyperactive BS, soft, mildly distended and diffuse TTP without rebound or guarding.  Has nausea with abdominal palpation.  MSK:   Normal tone and bulk, no LEE.  Pain with movement of the left shoulder due to posterior rib pain.  Neuro:  Grossly moves all extremities  Data Reviewed: Basic Metabolic Panel:  Recent Labs Lab 09/10/13 0449 09/10/13 1921 09/11/13 0500  NA 139 141 142  K 4.5 4.4 4.0  CL 98 105 106  CO2 30 25 27   GLUCOSE 163* 118* 98  BUN 28* 37* 30*  CREATININE 0.88 0.76 0.75  CALCIUM 9.1 8.3* 8.5  MG  --  1.8 1.9  PHOS  --   --  2.6   Liver Function Tests:  Recent Labs Lab 09/10/13 0449  AST 19  ALT 15  ALKPHOS 105  BILITOT 0.3  PROT 6.0  ALBUMIN 2.4*   No results found for this basename: LIPASE, AMYLASE,  in the last 168 hours No results found for this basename: AMMONIA,  in the last 168 hours CBC:  Recent Labs Lab 09/10/13 0449 09/10/13 1152 09/10/13 1711 09/10/13 1921 09/11/13 0500  WBC 17.9*  --   --  18.7* 13.0*  NEUTROABS 15.3*  --   --  14.8* 9.5*  HGB 10.1* 8.4* 7.0* 7.1* 9.6*  HCT 30.3* 25.1* 21.2* 21.1* 28.3*  MCV 95.0  --   --  95.5 91.9  PLT 444*  --   --  379 258   Cardiac Enzymes:  Recent Labs Lab 09/11/13 0500  TROPONINI <0.30   BNP (last 3 results) No results found for this basename: PROBNP,  in the last 8760 hours CBG: No results found for this basename: GLUCAP,  in the last 168 hours  Recent Results (from the past 240 hour(s))  MRSA PCR SCREENING     Status: None   Collection Time    09/10/13  6:43 PM      Result Value Ref Range Status   MRSA by PCR NEGATIVE  NEGATIVE Final   Comment:            The GeneXpert MRSA Assay  (FDA     approved for NASAL specimens     only), is one component of a     comprehensive MRSA colonization     surveillance program. It is not     intended to diagnose MRSA     infection nor to guide or     monitor treatment for  MRSA infections.     Studies: Ct Abdomen Pelvis W Contrast  09/10/2013   CLINICAL DATA:  Elevated white blood cell count. Abdominal distention. Tarry stools  EXAM: CT ABDOMEN AND PELVIS WITH CONTRAST  TECHNIQUE: Multidetector CT imaging of the abdomen and pelvis was performed using the standard protocol following bolus administration of intravenous contrast.  CONTRAST:  145mL OMNIPAQUE IOHEXOL 300 MG/ML  SOLN  COMPARISON:  Chest radiograph 09/02/2013  FINDINGS: Left lateral rib fractures again noted. There bilateral pleural effusions greater on the left. There is bibasilar atelectasis associated these moderate effusions. No pericardial fluid.  There is no focal hepatic lesion. Gallbladder is not identified. Pancreas, spleen, adrenal glands, kidneys are normal. There is significant streak artifact through the abdomen from the posterior lumbar fixation.  Stomach, small bowel, and cecum are normal. Appendix is not identified. The sigmoid colon has multiple diverticula. No acute diverticulitis.  Abdominal aorta is normal caliber with intimal calcification. No retroperitoneal periportal lymphadenopathy.  No free fluid in the pelvis. There is a 5.7 cm cyst associated with the right ovary. Post hysterectomy anatomy. Bladder is normal. No aggressive osseous lesion. Posterior lumbar fusion again noted.  IMPRESSION: 1. No acute abdominal or pelvic findings. No explanation for elevated white blood cell count. 2. Bilateral moderate to large pleural effusions with associated atelectasis. 3. Posterior left rib fractures. 4. Large benign appearing cyst associated with the right ovary. In late postmenopausal female, recommend abdominal ultrasound further evaluation. This recommendation  follows ACR consensus guidelines: White Paper of the ACR Incidental Findings Committee II on Adnexal Findings. J Am Coll Radiol [phone].   Electronically Signed   By: Suzy Bouchard M.D.   On: 09/10/2013 08:36   Dg Chest Port 1 View  09/11/2013   CLINICAL DATA:  Respiratory distress  EXAM: PORTABLE CHEST - 1 VIEW  COMPARISON:  09/10/2013  FINDINGS: Moderate to large left pleural effusion and small to moderate right pleural effusion with associated airspace consolidations, similar to prior. Interstitial and hazy airspace opacities, similar to prior. Mild upper lobe bronchiectasis. No pneumothorax. Left IJ catheter tip projects over the mid SVC. No interval osseous change. Multilevel degenerative changes and curvature of the spine. Multiple left lateral rib fractures.  IMPRESSION: Similar appearance to prior with left greater than right pleural effusions and associated airspace consolidations ; atelectasis versus pneumonia.  Interstitial and hazy airspace opacities may reflect edema superimposed on chronic changes.   Electronically Signed   By: Carlos Levering M.D.   On: 09/11/2013 06:38   Dg Chest Port 1 View  09/10/2013   CLINICAL DATA:  Central line placement.  EXAM: PORTABLE CHEST - 1 VIEW  COMPARISON:  09/10/2013 at 10:44 a.m.  FINDINGS: Left IJ central line tip:  SVC.  Large left pleural effusion, bilateral airspace opacities, and obscured bilateral hemidiaphragms again noted. Left cardiac margin obscured.  IMPRESSION: 1. Left IJ line tip: Lower SVC. Otherwise stable appearance of the chest.   Electronically Signed   By: Sherryl Barters M.D.   On: 09/10/2013 19:57   Dg Chest Port 1 View  09/10/2013   CLINICAL DATA:  Recent left-sided rib fractures.  EXAM: PORTABLE CHEST - 1 VIEW  COMPARISON:  09/02/2013.  FINDINGS: Interval increase in left-sided pleural effusion noted with fluid now seen in the left apex. Ms. raises the question of a degree of loculation. Numerous left-sided rib fractures  are evident. There is right base atelectasis with probable small right pleural effusion associated. Interstitial markings are diffusely coarsened with chronic features.  The cardiopericardial silhouette is within normal limits for size. Bones are diffusely demineralized. Telemetry leads overlie the chest.  IMPRESSION: Interval increase in left-sided pleural effusion which is now moderate to large in size and may be loculated. No evidence for associated pneumothorax.  Persistent right base atelectasis with small right pleural effusion.   Electronically Signed   By: Misty Stanley M.D.   On: 09/10/2013 11:02    Scheduled Meds: . sodium chloride   Intravenous Once  . sodium chloride   Intravenous Once  . pantoprazole (PROTONIX) IV  80 mg Intravenous BID  . phytonadione  5 mg Oral Daily  . sodium chloride  3 mL Intravenous Q12H  . sucralfate  1 g Oral TID WC & HS   Continuous Infusions:   Active Problems:   Atrial fibrillation   Multiple rib fractures   GI bleed   Acute blood loss anemia   Pleural effusion   Acute respiratory failure with hypoxia   Other and unspecified ovarian cyst   Hypotension (arterial)   Pleural effusion, left    Time spent: 35 min    Othella Slappey, [facility]  [facility] Pager [phone]. If 7PM-7AM, please contact night-coverage at [email or web address], password [facility] 09/11/2013, 7:46 AM  LOS: 1 day

## 2013-09-11 NOTE — Progress Notes (Signed)
Patient seen and examined with NP S. Minor.  Lab, images, and vitals reviewed. Agree with NP S. Minor assessment and plan with the following exceptions:   1. Dyspnea - multifactorial - rib fx, deconditioning, b\l pleural effusion, anemia, possible L hemothorax - cont with supplemental O2, currently on anticoagulation for A.fib, currently being reversed. - will consider thora once INR is corrected, and possibly chest tube (this was discussed with patient, she does not want CT at this time) - patient is now DNR  2. Anemia - blood loss - possible GIB and hemothorax - s\p 2u prbc - follow H\H    Initial Consult Time = 70mins  Vilinda Boehringer, MD [facility] Pager [phone] On Call Pager [phone]

## 2013-09-11 NOTE — Progress Notes (Signed)
Update Spoke with patient and her sons Education officer, community). Discussed current diagnosis and differentials. Patient with recent fall and has a steady decline in health status over the past 2 weeks. She does not want any prolonged life saving measures. Currently admitted with suspected blood loss anemia, rib fractures, possible GIB, increasing pleural effusion with concern for hemothorax.  We did place an U\S probe on her posterior thorax and visualized a pocket of fluid, but the patient stated that getting to the bedside was uncomfortable and she would not be able to tolerate a thoracentesis or chest tube placement.  Discussed thoracentesis with possible chest tube placement (if needed) with patient and her sons; patient has declined this intervention, and her sons are in agreement.   At this time from pulmonary standpoint, pain management would be best suited to assist with her breathing and her level of comfort, given her rib fractures.   Discussed comfort\hospice care with family, they would requested further information.  Palliative care consult placed.  Vilinda Boehringer, MD [facility] Pager [phone] On Call Pager [phone]   Critical Care time - 69mins

## 2013-09-11 NOTE — Progress Notes (Signed)
RT placed patient on BIPAP via Servo i per MD for respiratory distress. Patient is on NIV pressure control 10/5 . Patient is tolerating well. RT will continue to monitor.

## 2013-09-11 NOTE — Consult Note (Signed)
Patient Vanessa [name]      DOB: 1920-04-21      DPO:[id number]     Consult Note from the Palliative Medicine Team at [facility] Requested by: Dr Sheran Fava     PCP: Ellsworth Lennox, MD Reason for Consultation:Clarification of [place] and options     Phone [phone]  Assessment of patients Current state:  Patient is faced with decisions regarding agressiveness of  medical treatment options.    Patient and family faced with advanced directive decisions and anticipatory care needs.  Consult is for review of medical treatment options, clarification of goals of care and end of life issues, disposition and options, and symptom recommendation.  This NP Wadie Lessen reviewed medical records, received report from team, assessed the patient and then meet at the patient's bedside along with her two sons  to discuss diagnosis prognosis, [place], EOL wishes disposition and options.  A detailed discussion was had today regarding advanced directives.  Concepts specific to code status, artifical feeding and hydration, continued IV antibiotics and rehospitalization was had.  The difference between a aggressive medical intervention path  and a palliative comfort care path for this patient at this time was had.  Values and goals of care important to patient and family were attempted to be elicited.  Concept of Hospice and Palliative Care were discussed  Natural trajectory and expectations at EOL were discussed.  Questions and concerns addressed.  Hard Choices booklet left for review. Family encouraged to call with questions or concerns.  PMT will continue to support holistically.   Goals of Care: 1.  Code Status: DNR/DNi   2. Scope of Treatment:  Continue present medical interventions, hopeful for improvement    1. Vital Signs: per unit 2. Respiratory/Oxygen:yes 3. Nutritional Support/Tube Feeds:no artifical feeding now or in the future 4. Antibiotics: yes 5. Blood Products:  yes 6. IVF:yes 7. Labs:yes 8. Telemetry:yes    3. Disposition:  Dependant on outcomes.  Family is considering home with hospice services   4. Symptom Management:   Weakness:  Medical management of treatable illness  5. Psychosocial:    Emotional support offered to patient and her family.  Patient clearly verbalizes her understanding that  her life expectany is limited, "I am 80 years old".   Both sons are hopeful for improvement but understand that her prognosis is likely limited and that the focus of care is comfort and quality  6. Spiritual:  Strong community church support    Brief HPI: The patient is a 80 y.o. year-old female with history of a-fib on chronic anticoagulation, carotid artery occlusion, HLD, and recurrent falls who presents with hematemesis and melena. The patient was admitted from 8/18 to 8/24 after a fall which resulted in multiple rib fractures. Her coumadin was stopped and reversed at that time but resumed at discharge. She continued to have rib pain and was receiving tylenol and naproxen 500mg  BID for her pain. She has had abdominal bloating x 1 week. Overnight last night, she developed dark tarry stools and coffee ground emesis, multiple episodes. She denies abdominal pain, fevers, chills. She did not eat well yesterday. Denies dysuria. Has new wheeze/SOB today.  In the ER, mild hypotension to 94/61, HR 125, R 30, 89% on RA. WBC 17.9, hgb 10.1 (down from 12.2 on 8/26), plt 444, BUN 28, INR 30.2. CT abd/pelvis demonstrated no acute finding, bilateral moderate to large pleural effusions, posterior left rib fractures, and a benign appearing right ovarian cyst.   Treatments  to stabilize in place.  Watchful waiting, hopeful for improvement.  Disposition dependant on outcomes    ROS: weakness, dry mouth   PMH:  Past Medical History  Diagnosis Date  . Atrial fibrillation   . Neuropathy     PERIPHERAL  . Carotid artery occlusion     LEFT  . Scoliosis   .  Hoarseness   . Headache(784.0)   . Dizziness   . Abdominal bloating   . Bruises easily   . Pruritus   . Hypercholesterolemia   . Fall at home Sept 2013, Dec. 2013  Jun 08, 2012  . Varicose veins      PSH: Past Surgical History  Procedure Laterality Date  . Gallbladder surgery    . Abdominal hysterectomy  1954  . Spine surgery  1997  . Cholecystectomy  1997    GalL Bladder   I have reviewed the FH and SH and  If appropriate update it with new information. Allergies  Allergen Reactions  . Tape Other (See Comments)    Tears skin. Band-Aids   Scheduled Meds: . sodium chloride   Intravenous Once  . sodium chloride   Intravenous Once  . furosemide      . furosemide  40 mg Intravenous Once  . morphine      . ondansetron      . [START ON 09/14/2013] pantoprazole (PROTONIX) IV  40 mg Intravenous Q12H  . phytonadione  5 mg Oral Daily  . sodium chloride  3 mL Intravenous Q12H  . sucralfate  1 g Oral TID WC & HS   Continuous Infusions: . pantoprozole (PROTONIX) infusion 8 mg/hr (09/11/13 1030)   PRN Meds:.morphine injection, ondansetron (ZOFRAN) IV    BP 129/36  Pulse 97  Temp(Src) 97.9 F (36.6 C) (Axillary)  Resp 27  Ht 5\' 2"  (1.575 m)  Wt 50.803 kg (112 lb)  BMI 20.48 kg/m2  SpO2 100%   PPS:30 %    Intake/Output Summary (Last 24 hours) at 09/11/13 1611 Last data filed at 09/11/13 1500  Gross per 24 hour  Intake 1408.75 ml  Output   3180 ml  Net -1771.25 ml     Physical Exam:  General: chronically ill appearing HEENT:  Dry buccal membranes, no exudate Chest:   Decreased in bases, CTA CVS: RRR Abdomen:soft NT +BS Ext: without edema Neuro: alert and oriented X3, with insight into her situation  Labs: CBC    Component Value Date/Time   WBC 13.0* 09/11/2013 0500   WBC 8.1 07/27/2011 1149   RBC 3.08* 09/11/2013 0500   RBC 4.47 07/27/2011 1149   HGB 10.6* 09/11/2013 0911   HGB 14.0 07/27/2011 1149   HCT 31.4* 09/11/2013 0911   HCT 45.1 07/27/2011 1149    PLT 258 09/11/2013 0500   MCV 91.9 09/11/2013 0500   MCV 101* 07/27/2011 1149   MCH 31.2 09/11/2013 0500   MCH 31.3* 07/27/2011 1149   MCHC 33.9 09/11/2013 0500   MCHC 31.0* 07/27/2011 1149   RDW 16.3* 09/11/2013 0500   LYMPHSABS 1.9 09/11/2013 0500   MONOABS 1.4* 09/11/2013 0500   EOSABS 0.3 09/11/2013 0500   BASOSABS 0.0 09/11/2013 0500    BMET    Component Value Date/Time   NA 142 09/11/2013 0500   K 4.0 09/11/2013 0500   CL 106 09/11/2013 0500   CO2 27 09/11/2013 0500   GLUCOSE 98 09/11/2013 0500   BUN 30* 09/11/2013 0500   CREATININE 0.75 09/11/2013 0500   CREATININE 0.95 06/23/2011 1056  CALCIUM 8.5 09/11/2013 0500   GFRNONAA 72* 09/11/2013 0500   GFRAA 83* 09/11/2013 0500    CMP     Component Value Date/Time   NA 142 09/11/2013 0500   K 4.0 09/11/2013 0500   CL 106 09/11/2013 0500   CO2 27 09/11/2013 0500   GLUCOSE 98 09/11/2013 0500   BUN 30* 09/11/2013 0500   CREATININE 0.75 09/11/2013 0500   CREATININE 0.95 06/23/2011 1056   CALCIUM 8.5 09/11/2013 0500   PROT 6.0 09/10/2013 0449   ALBUMIN 2.4* 09/10/2013 0449   AST 19 09/10/2013 0449   ALT 15 09/10/2013 0449   ALKPHOS 105 09/10/2013 0449   BILITOT 0.3 09/10/2013 0449   GFRNONAA 72* 09/11/2013 0500   GFRAA 83* 09/11/2013 0500     Time In Time Out Total Time Spent with Patient Total Overall Time  1230 1345 70 min 75 min    Greater than 50%  of this time was spent counseling and coordinating care related to the above assessment and plan.   Wadie Lessen NP  Palliative Medicine Team Team Phone # [phone] Pager [phone]  Discussed with Dr Sheran Fava

## 2013-09-11 NOTE — Progress Notes (Signed)
INITIAL NUTRITION ASSESSMENT  DOCUMENTATION CODES Per approved criteria  -Not Applicable   INTERVENTION: - Diet advancement per MD, recommend dysphagia 1/thin as pt on this diet PTA - RD to continue to monitor   NUTRITION DIAGNOSIS: Inadequate oral intake related to inability to eat as evidenced by NPO.    Goal: Advance diet as tolerated to dysphagia/1thin  Monitor:  Weights, labs, diet advancement  Reason for Assessment: Malnutrition screening tool   80 y.o. female  Admitting Dx: Melena, coffee ground emesis   ASSESSMENT: Pt with history of a-fib on chronic anticoagulation, carotid artery occlusion, HLD, and recurrent falls who presents with hematemesis and melena. The patient was admitted from 8/18 to 8/24 after a fall which resulted in multiple rib fractures. She has had abdominal bloating x 1 week. On the night PTA, she developed dark tarry stools and coffee ground emesis, multiple episodes. Had new wheeze/SOB day of admission.   - Found to have GI bleed and bilateral pleural effusions - GI following - Met with pt and caregiver who report pt's intake has been down x 3 weeks - Pt on puree/thin diet PTA with 25% meal intake - Was drinking 1 Ensure/day - Caregiver reports pt's weight relatively stable    Height: Ht Readings from Last 1 Encounters:  09/10/13 '5\' 2"'  (1.575 m)    Weight: Wt Readings from Last 1 Encounters:  09/10/13 112 lb (50.803 kg)    Ideal Body Weight: 110 lbs   % Ideal Body Weight: 102%  Wt Readings from Last 10 Encounters:  09/10/13 112 lb (50.803 kg)  08/28/13 117 lb 4.6 oz (53.2 kg)  08/22/13 116 lb (52.617 kg)  08/21/13 115 lb 6.4 oz (52.345 kg)  07/26/13 115 lb (52.164 kg)  07/11/13 115 lb (52.164 kg)  06/29/13 117 lb (53.071 kg)  06/02/13 114 lb 9.6 oz (51.982 kg)  02/21/13 117 lb (53.071 kg)  02/19/13 116 lb (52.617 kg)    Usual Body Weight: 117 lbs earlier this month  % Usual Body Weight: 96%  BMI:  Body mass index is  20.48 kg/(m^2).  Estimated Nutritional Needs: Kcal: 1250-1450 Protein: 60-80g Fluid: 1.2-1.4L/day   Skin: intact   Diet Order: NPO  EDUCATION NEEDS: -No education needs identified at this time   Intake/Output Summary (Last 24 hours) at 09/11/13 1351 Last data filed at 09/11/13 1200  Gross per 24 hour  Intake 1303.75 ml  Output   2480 ml  Net -1176.25 ml    Last BM: 8/30  Labs:   Recent Labs Lab 09/10/13 0449 09/10/13 1921 09/11/13 0500  NA 139 141 142  K 4.5 4.4 4.0  CL 98 105 106  CO2 '30 25 27  ' BUN 28* 37* 30*  CREATININE 0.88 0.76 0.75  CALCIUM 9.1 8.3* 8.5  MG  --  1.8 1.9  PHOS  --   --  2.6  GLUCOSE 163* 118* 98    CBG (last 3)  No results found for this basename: GLUCAP,  in the last 72 hours  Scheduled Meds: . sodium chloride   Intravenous Once  . sodium chloride   Intravenous Once  . furosemide      . morphine      . ondansetron      . [START ON 09/14/2013] pantoprazole (PROTONIX) IV  40 mg Intravenous Q12H  . phytonadione  5 mg Oral Daily  . sodium chloride  3 mL Intravenous Q12H  . sucralfate  1 g Oral TID WC & HS    Continuous  Infusions: . pantoprozole (PROTONIX) infusion 8 mg/hr (09/11/13 1030)    Past Medical History  Diagnosis Date  . Atrial fibrillation   . Neuropathy     PERIPHERAL  . Carotid artery occlusion     LEFT  . Scoliosis   . Hoarseness   . Headache(784.0)   . Dizziness   . Abdominal bloating   . Bruises easily   . Pruritus   . Hypercholesterolemia   . Fall at home Sept 2013, Dec. 2013  Jun 08, 2012  . Varicose veins     Past Surgical History  Procedure Laterality Date  . Gallbladder surgery    . Abdominal hysterectomy  1954  . Spine surgery  1997  . Cholecystectomy  1997    GalL Bladder    [place], [place], [place] Pager [phone] Weekend/After Hours Pager

## 2013-09-11 NOTE — Progress Notes (Signed)
PCCM Interval Progress Note  S:  Asked by Warren Lacy MD to assess pt at bedside for respiratory distress.  Per RN, pt has been experiencing SOB and tachypnea since roughly 5:30AM.  Is now having moderate respiratory distress.  She received 2u PRBC and 1u FFP overnight.  O:  BP 132/50  Pulse 91  Temp(Src) 98.2 F (36.8 C) (Axillary)  Resp 28  Ht 5\' 2"  (1.575 m)  Wt 50.803 kg (112 lb)  BMI 20.48 kg/m2  SpO2 99%  Gen: Elderly, ill appearing female, in moderate respiratory distress. Neuro:  A&O x 3, MAE's. Heart:  Tachy but regular, no M/R/G. Lungs:  Respirations shallow and rapid, tachypneic into high 30's, paradoxical breathing. Abd: BS hypoactive, soft, NT/ND. Ext: No edema.   CXR 8/31:  Bilateral pleural effusions, L > R, possible hemothorax.  A: Acute respiratory failure Bilateral pleural effusions L > R Concern for pulmonary edema following PRBC transfusions  P: Lasix 20mg  administered. Will start BiPAP now. STAT CXR. If worsens, will need to discuss goals of care further with family (see discussion below).  Global:  Pt admitted with GI bleed, received 2u PRBC and 1u FFP overnight.  Developed acute SOB and respiratory distress early AM 9/1.  Received lasix 20mg  x 1 and started on BiPAP.  CXR obtained.  Note, pt is listed as full code; however, when asked whether she wished for intubation if BiPAP fails, she adamantly refused.  Additionally, when asked if she wished for CPR / electric shocks if her heart were to stop, she once again refused.   Phone call to pt's son, Novella Rob attempted at 6:00AM by RN >>> no answer.  AM team to try again as goals of care discussion is needed.   Montey Hora, [facility] Pgr: [phone]  or [phone]

## 2013-09-11 NOTE — Progress Notes (Signed)
**Note De-Identified Vanessa [name] Obfuscation** Clinical Social Work Department BRIEF PSYCHOSOCIAL ASSESSMENT 09/11/2013  Patient:  Vanessa [name], Vanessa [name]     Account Number:  [id number]     Admit date:  09/10/2013  Clinical Social Worker:  Earlie Server  Date/Time:  09/11/2013 09:30 AM  Referred by:  Physician  Date Referred:  09/11/2013 Referred for  SNF Placement   Other Referral:   Interview type:  Family Other interview type:    PSYCHOSOCIAL DATA Living Status:  FACILITY Admitted from facility:  [facility] Level of care:  [place] Primary support name:  Novella Rob Primary support relationship to patient:  CHILD, ADULT Degree of support available:   Strong    CURRENT CONCERNS Current Concerns  Post-Acute Placement   Other Concerns:    SOCIAL WORK ASSESSMENT / PLAN CSW received referral due to patient being admitted from SNF. CSW reviewed chart and spoke with bedside RN prior to meeting with patient and family. RN entered room to complete procedure on patient so CSW met with sons outside of room. CSW introduced myself and explained role.    Patient was living at [facility] prior to admission. Patient has two sons who live out of town. One son lives in [place] and one son lives in [place] but both were at the hospital to visit patient. Sons report that this is a difficult time and they are hopeful that patient will recover. Sons report they plan on staying to provide support for patient but they also have a close family friend that has assisted patient as well. CSW offered emotional support and agreeable to continue to follow throughout patient's stay.    CSW completed FL2 and placed on chart. CSW spoke with SNF who is agreeable to accept patient when medically stable and son has completed a bed hold.   Assessment/plan status:  Psychosocial Support/Ongoing Assessment of Needs Other assessment/ plan:   Information/referral to community resources:   Will return to SNF    PATIENT'S/FAMILY'S  RESPONSE TO PLAN OF CARE: Patient working with RN so did not participate in assessment. Son's have flat affect and worried about patient's wellbeing. Son reports being tired and working over 20 hours yesterday in order to be able to get to the hospital. Sons report they will support one another and will continue to visit patient. Sons thanked CSW for visit.       Sindy Messing, LCSW (Coverage for [employer])

## 2013-09-11 NOTE — Progress Notes (Signed)
[facility] Progress Note  Subjective: Patient feels short of breath. RN reports she had melena but no hematemesis.HGB up to 9.6 after transfusion of PRCs. INR down to 1.93 after vitamin K and FFP. CXR shows large left pleural effusion.  Objective: Vital signs in last 24 hours: Temp:  [97.8 F (36.6 C)-99.2 F (37.3 C)] 98.2 F (36.8 C) (09/01 0600) Pulse Rate:  [50-121] 107 (09/01 0900) Resp:  [19-31] 19 (09/01 0900) BP: (82-153)/(22-86) 109/47 mmHg (09/01 0900) SpO2:  [91 %-100 %] 93 % (09/01 0900) FiO2 (%):  [40 %] 40 % (09/01 0618) Weight:  [50.803 kg (112 lb)] 50.803 kg (112 lb) (08/31 1053) Weight change: 0 kg (0 lb)   PE  Short of breath Heart irregular. Lungs with decreased breath sounds on left. Abdomen non tender.  Lab Results: Results for orders placed during the hospital encounter of 09/10/13 (from the past 24 hour(s))  URINALYSIS, ROUTINE W REFLEX MICROSCOPIC     Status: Abnormal   Collection Time    09/10/13 11:09 AM      Result Value Ref Range   Color, Urine YELLOW  YELLOW   APPearance CLOUDY (*) CLEAR   Specific Gravity, Urine >1.046 (*) 1.005 - 1.030   pH 5.5  5.0 - 8.0   Glucose, UA NEGATIVE  NEGATIVE mg/dL   Hgb urine dipstick SMALL (*) NEGATIVE   Bilirubin Urine NEGATIVE  NEGATIVE   Ketones, ur NEGATIVE  NEGATIVE mg/dL   Protein, ur NEGATIVE  NEGATIVE mg/dL   Urobilinogen, UA 0.2  0.0 - 1.0 mg/dL   Nitrite NEGATIVE  NEGATIVE   Leukocytes, UA NEGATIVE  NEGATIVE  URINE MICROSCOPIC-ADD ON     Status: Abnormal   Collection Time    09/10/13 11:09 AM      Result Value Ref Range   Squamous Epithelial / LPF FEW (*) RARE   WBC, UA 0-2  <3 WBC/hpf   RBC / HPF 3-6  <3 RBC/hpf   Bacteria, UA FEW (*) RARE   Crystals CA OXALATE CRYSTALS (*) NEGATIVE  TYPE AND SCREEN     Status: None   Collection Time    09/10/13 11:30 AM      Result Value Ref Range   ABO/RH(D) B NEG     Antibody Screen NEG     Sample Expiration 09/13/2013     Unit Number  [id number]     Blood Component Type RED CELLS,LR     Unit division 00     Status of Unit ISSUED,FINAL     Transfusion Status OK TO TRANSFUSE     Crossmatch Result Compatible     Unit Number [id number]     Blood Component Type RBC LR PHER1     Unit division 00     Status of Unit ISSUED     Transfusion Status OK TO TRANSFUSE     Crossmatch Result Compatible    ABO/RH     Status: None   Collection Time    09/10/13 11:30 AM      Result Value Ref Range   ABO/RH(D) B NEG    HEMOGLOBIN AND HEMATOCRIT, BLOOD     Status: Abnormal   Collection Time    09/10/13 11:52 AM      Result Value Ref Range   Hemoglobin 8.4 (*) 12.0 - 15.0 g/dL   HCT 25.1 (*) 36.0 - 46.0 %  PREPARE RBC (CROSSMATCH)     Status: None   Collection Time    09/10/13  3:00 PM  Result Value Ref Range   Order Confirmation ORDER PROCESSED BY BLOOD BANK    PREPARE FRESH FROZEN PLASMA     Status: None   Collection Time    09/10/13  3:00 PM      Result Value Ref Range   Unit Number [id number]     Blood Component Type THAWED PLASMA     Unit division 00     Status of Unit ISSUED,FINAL     Transfusion Status OK TO TRANSFUSE    HEMOGLOBIN AND HEMATOCRIT, BLOOD     Status: Abnormal   Collection Time    09/10/13  5:11 PM      Result Value Ref Range   Hemoglobin 7.0 (*) 12.0 - 15.0 g/dL   HCT 21.2 (*) 36.0 - 46.0 %  MRSA PCR SCREENING     Status: None   Collection Time    09/10/13  6:43 PM      Result Value Ref Range   MRSA by PCR NEGATIVE  NEGATIVE  PROTIME-INR     Status: Abnormal   Collection Time    09/10/13  7:21 PM      Result Value Ref Range   Prothrombin Time 22.1 (*) 11.6 - 15.2 seconds   INR 1.93 (*) 0.00 - 1.49  CBC WITH DIFFERENTIAL     Status: Abnormal   Collection Time    09/10/13  7:21 PM      Result Value Ref Range   WBC 18.7 (*) 4.0 - 10.5 K/uL   RBC 2.21 (*) 3.87 - 5.11 MIL/uL   Hemoglobin 7.1 (*) 12.0 - 15.0 g/dL   HCT 21.1 (*) 36.0 - 46.0 %   MCV 95.5  78.0 - 100.0 fL   MCH  32.1  26.0 - 34.0 pg   MCHC 33.6  30.0 - 36.0 g/dL   RDW 15.4  11.5 - 15.5 %   Platelets 379  150 - 400 K/uL   Neutrophils Relative % 79 (*) 43 - 77 %   Neutro Abs 14.8 (*) 1.7 - 7.7 K/uL   Lymphocytes Relative 11 (*) 12 - 46 %   Lymphs Abs 2.0  0.7 - 4.0 K/uL   Monocytes Relative 9  3 - 12 %   Monocytes Absolute 1.7 (*) 0.1 - 1.0 K/uL   Eosinophils Relative 1  0 - 5 %   Eosinophils Absolute 0.1  0.0 - 0.7 K/uL   Basophils Relative 0  0 - 1 %   Basophils Absolute 0.0  0.0 - 0.1 K/uL  BASIC METABOLIC PANEL     Status: Abnormal   Collection Time    09/10/13  7:21 PM      Result Value Ref Range   Sodium 141  137 - 147 mEq/L   Potassium 4.4  3.7 - 5.3 mEq/L   Chloride 105  96 - 112 mEq/L   CO2 25  19 - 32 mEq/L   Glucose, Bld 118 (*) 70 - 99 mg/dL   BUN 37 (*) 6 - 23 mg/dL   Creatinine, Ser 0.76  0.50 - 1.10 mg/dL   Calcium 8.3 (*) 8.4 - 10.5 mg/dL   GFR calc non Af Amer 72 (*) >90 mL/min   GFR calc Af Amer 83 (*) >90 mL/min   Anion gap 11  5 - 15  MAGNESIUM     Status: None   Collection Time    09/10/13  7:21 PM      Result Value Ref Range   Magnesium 1.8  1.5 -  2.5 mg/dL  LACTIC ACID, PLASMA     Status: None   Collection Time    09/10/13  7:22 PM      Result Value Ref Range   Lactic Acid, Venous 0.9  0.5 - 2.2 mmol/L  TROPONIN I     Status: None   Collection Time    09/11/13  5:00 AM      Result Value Ref Range   Troponin I <0.30  <0.30 ng/mL  PHOSPHORUS     Status: None   Collection Time    09/11/13  5:00 AM      Result Value Ref Range   Phosphorus 2.6  2.3 - 4.6 mg/dL  BASIC METABOLIC PANEL     Status: Abnormal   Collection Time    09/11/13  5:00 AM      Result Value Ref Range   Sodium 142  137 - 147 mEq/L   Potassium 4.0  3.7 - 5.3 mEq/L   Chloride 106  96 - 112 mEq/L   CO2 27  19 - 32 mEq/L   Glucose, Bld 98  70 - 99 mg/dL   BUN 30 (*) 6 - 23 mg/dL   Creatinine, Ser 0.75  0.50 - 1.10 mg/dL   Calcium 8.5  8.4 - 10.5 mg/dL   GFR calc non Af Amer 72 (*) >90  mL/min   GFR calc Af Amer 83 (*) >90 mL/min   Anion gap 9  5 - 15  MAGNESIUM     Status: None   Collection Time    09/11/13  5:00 AM      Result Value Ref Range   Magnesium 1.9  1.5 - 2.5 mg/dL  CBC WITH DIFFERENTIAL     Status: Abnormal   Collection Time    09/11/13  5:00 AM      Result Value Ref Range   WBC 13.0 (*) 4.0 - 10.5 K/uL   RBC 3.08 (*) 3.87 - 5.11 MIL/uL   Hemoglobin 9.6 (*) 12.0 - 15.0 g/dL   HCT 28.3 (*) 36.0 - 46.0 %   MCV 91.9  78.0 - 100.0 fL   MCH 31.2  26.0 - 34.0 pg   MCHC 33.9  30.0 - 36.0 g/dL   RDW 16.3 (*) 11.5 - 15.5 %   Platelets 258  150 - 400 K/uL   Neutrophils Relative % 73  43 - 77 %   Neutro Abs 9.5 (*) 1.7 - 7.7 K/uL   Lymphocytes Relative 15  12 - 46 %   Lymphs Abs 1.9  0.7 - 4.0 K/uL   Monocytes Relative 11  3 - 12 %   Monocytes Absolute 1.4 (*) 0.1 - 1.0 K/uL   Eosinophils Relative 2  0 - 5 %   Eosinophils Absolute 0.3  0.0 - 0.7 K/uL   Basophils Relative 0  0 - 1 %   Basophils Absolute 0.0  0.0 - 0.1 K/uL  HEMOGLOBIN AND HEMATOCRIT, BLOOD     Status: Abnormal   Collection Time    09/11/13  9:11 AM      Result Value Ref Range   Hemoglobin 10.6 (*) 12.0 - 15.0 g/dL   HCT 31.4 (*) 36.0 - 46.0 %    Studies/Results: Ct Abdomen Pelvis W Contrast  09/10/2013   CLINICAL DATA:  Elevated white blood cell count. Abdominal distention. Tarry stools  EXAM: CT ABDOMEN AND PELVIS WITH CONTRAST  TECHNIQUE: Multidetector CT imaging of the abdomen and pelvis was performed using the standard protocol following  bolus administration of intravenous contrast.  CONTRAST:  111mL OMNIPAQUE IOHEXOL 300 MG/ML  SOLN  COMPARISON:  Chest radiograph 09/02/2013  FINDINGS: Left lateral rib fractures again noted. There bilateral pleural effusions greater on the left. There is bibasilar atelectasis associated these moderate effusions. No pericardial fluid.  There is no focal hepatic lesion. Gallbladder is not identified. Pancreas, spleen, adrenal glands, kidneys are normal.  There is significant streak artifact through the abdomen from the posterior lumbar fixation.  Stomach, small bowel, and cecum are normal. Appendix is not identified. The sigmoid colon has multiple diverticula. No acute diverticulitis.  Abdominal aorta is normal caliber with intimal calcification. No retroperitoneal periportal lymphadenopathy.  No free fluid in the pelvis. There is a 5.7 cm cyst associated with the right ovary. Post hysterectomy anatomy. Bladder is normal. No aggressive osseous lesion. Posterior lumbar fusion again noted.  IMPRESSION: 1. No acute abdominal or pelvic findings. No explanation for elevated white blood cell count. 2. Bilateral moderate to large pleural effusions with associated atelectasis. 3. Posterior left rib fractures. 4. Large benign appearing cyst associated with the right ovary. In late postmenopausal female, recommend abdominal ultrasound further evaluation. This recommendation follows ACR consensus guidelines: White Paper of the ACR Incidental Findings Committee II on Adnexal Findings. J Am Coll Radiol [phone].   Electronically Signed   By: Suzy Bouchard M.D.   On: 09/10/2013 08:36   Dg Chest Port 1 View  09/10/2013   CLINICAL DATA:  Central line placement.  EXAM: PORTABLE CHEST - 1 VIEW  COMPARISON:  09/10/2013 at 10:44 a.m.  FINDINGS: Left IJ central line tip:  SVC.  Large left pleural effusion, bilateral airspace opacities, and obscured bilateral hemidiaphragms again noted. Left cardiac margin obscured.  IMPRESSION: 1. Left IJ line tip: Lower SVC. Otherwise stable appearance of the chest.   Electronically Signed   By: Sherryl Barters M.D.   On: 09/10/2013 19:57   Dg Chest Port 1 View  09/10/2013   CLINICAL DATA:  Recent left-sided rib fractures.  EXAM: PORTABLE CHEST - 1 VIEW  COMPARISON:  09/02/2013.  FINDINGS: Interval increase in left-sided pleural effusion noted with fluid now seen in the left apex. Ms. raises the question of a degree of loculation.  Numerous left-sided rib fractures are evident. There is right base atelectasis with probable small right pleural effusion associated. Interstitial markings are diffusely coarsened with chronic features. The cardiopericardial silhouette is within normal limits for size. Bones are diffusely demineralized. Telemetry leads overlie the chest.  IMPRESSION: Interval increase in left-sided pleural effusion which is now moderate to large in size and may be loculated. No evidence for associated pneumothorax.  Persistent right base atelectasis with small right pleural effusion.   Electronically Signed   By: Misty Stanley M.D.   On: 09/10/2013 11:02      Assessment: UGI bleed. Coagulopathy due to Coumadin improving. Dyspnea with large left pleural effusion  Plan: From a GI standpoint we will continue to treat medically. I discussed EGD with her but she does not want it, and with her shortness of breath sedation poses increased risk. Continue PPI therapy.     Wonda Horner 09/11/2013, 9:36 AM  Lab Results  Component Value Date   HGB 10.6* 09/11/2013   HGB 9.6* 09/11/2013   HGB 7.1* 09/10/2013   HGB 14.0 07/27/2011   HGB 13.8 06/23/2011   HCT 31.4* 09/11/2013   HCT 28.3* 09/11/2013   HCT 21.1* 09/10/2013   HCT 45.1 07/27/2011   HCT 43.9 06/23/2011  ALKPHOS 105 09/10/2013   ALKPHOS 92 07/09/2013   ALKPHOS 95 07/20/2012   AST 19 09/10/2013   AST 28 07/09/2013   AST 27 07/20/2012   ALT 15 09/10/2013   ALT 14 07/09/2013   ALT 17 07/20/2012

## 2013-09-11 NOTE — Progress Notes (Signed)
PULMONARY / CRITICAL CARE MEDICINE   Name: Vanessa [name] MRN: [id number] DOB: 06/10/1920    ADMISSION DATE:  09/10/2013 CONSULTATION DATE:  09/10/2013  REFERRING MD :  Sheran Fava  CHIEF COMPLAINT:  GI bleed  INITIAL PRESENTATION: 80 year old female presented 8/31 to [facility] ED for dark stools and coffee ground emesis. She takes coumadin for AF. In ED hemoglobin was stable and she was admitted to tele. Now hemoglobin 7. She is being moved to ICU, PCCM asked to see.   STUDIES:  CT abd 8/31 > No acute abdominal or pelvic findings. Bilateral moderate to large pleural effusions with associated atelectasis.Posterior left rib fractures. Large benign appearing cyst associated with the right ovary.   SIGNIFICANT EVENTS: 8/30 admitted 8/31 hgb 7, to ICU. Transfused. 9/1 placed on nimvs 9/1 code staus changed to DNR  HISTORY OF PRESENT ILLNESS:  [name] with extensive PMH which includes a-fib on chronic anticoagulation, carotid artery occlusion, and recurrent falls .   The patient was admitted from 8/18 to 8/24 after a fall which resulted in multiple rib fractures. Her coumadin was stopped and reversed at that time but resumed at discharge. She has had abdominal bloating x 1 week. She now presents with hematemesis and melena.  8/30 she developed dark tarry stools and coffee ground emesis, multiple episodes. She presented to ED 8/31. She was stable and admitted to tele. 8/31 PM hemoglobin continued to fall (7) with bp/hr instability but improved with fluids. . She was moved to ICU and PCCM asked to see and time of evaluation had not received any blood products and has difficult access. Of note, CXR at admission 09/10/2013 showed new left pleural effusion since 08/30/13  compared to 08/28/13 when she was admitted with fractures on same side   VITAL SIGNS: Temp:  [97.8 F (36.6 C)-99.2 F (37.3 C)] 98.2 F (36.8 C) (09/01 0600) Pulse Rate:  [50-121] 99 (09/01 1000) Resp:  [18-31] 18 (09/01 1000) BP:  (82-153)/(22-86) 119/52 mmHg (09/01 1000) SpO2:  [90 %-100 %] 90 % (09/01 1000) FiO2 (%):  [40 %] 40 % (09/01 0618) Weight:  [112 lb (50.803 kg)] 112 lb (50.803 kg) (08/31 1053) HEMODYNAMICS:   VENTILATOR SETTINGS: Vent Mode:  [-] BIPAP FiO2 (%):  [40 %] 40 % Set Rate:  [15 bmp] 15 bmp PEEP:  [5 cmH20] 5 cmH20 INTAKE / OUTPUT:  Intake/Output Summary (Last 24 hours) at 09/11/13 1045 Last data filed at 09/11/13 1000  Gross per 24 hour  Intake 1208.75 ml  Output   2590 ml  Net -1381.25 ml    PHYSICAL EXAMINATION: General:  Elderly female in NAD, weak, off nimvs. Neuro:  Alert, oriented. Responds appropriately HEENT:  [place]/AT, no JVD noted Cardiovascular:  RRR Lungs:  Resp's even, unlabored. Diminished L>R, sob with extensive conversations. Abdomen:  Soft, non-tender, mildly distended, hyperactive BS.  Musculoskeletal:  No acute deformity Skin:  Intact, multiple ecchymotic areas.   LABS: PULMONARY No results found for this basename: PHART, PCO2, PCO2ART, PO2, PO2ART, HCO3, TCO2, O2SAT,  in the last 168 hours  CBC  Recent Labs Lab 09/10/13 0449  09/10/13 1921 09/11/13 0500 09/11/13 0911  HGB 10.1*  < > 7.1* 9.6* 10.6*  HCT 30.3*  < > 21.1* 28.3* 31.4*  WBC 17.9*  --  18.7* 13.0*  --   PLT 444*  --  379 258  --   < > = values in this interval not displayed.  COAGULATION  Recent Labs Lab 09/10/13 0449 09/10/13 1921  INR 3.02* 1.93*    CARDIAC    Recent Labs Lab 09/11/13 0500  TROPONINI <0.30   No results found for this basename: PROBNP,  in the last 168 hours   CHEMISTRY  Recent Labs Lab 09/10/13 0449 09/10/13 1921 09/11/13 0500  NA 139 141 142  K 4.5 4.4 4.0  CL 98 105 106  CO2 30 25 27   GLUCOSE 163* 118* 98  BUN 28* 37* 30*  CREATININE 0.88 0.76 0.75  CALCIUM 9.1 8.3* 8.5  MG  --  1.8 1.9  PHOS  --   --  2.6   Estimated Creatinine Clearance: 36.2 ml/min (by C-G formula based on Cr of 0.75).   LIVER  Recent Labs Lab 09/10/13 0449  09/10/13 1921  AST 19  --   ALT 15  --   ALKPHOS 105  --   BILITOT 0.3  --   PROT 6.0  --   ALBUMIN 2.4*  --   INR 3.02* 1.93*     INFECTIOUS  Recent Labs Lab 09/10/13 0507 09/10/13 1922  LATICACIDVEN 1.41 0.9     ENDOCRINE CBG (last 3)  No results found for this basename: GLUCAP,  in the last 72 hours       IMAGING x48h Ct Abdomen Pelvis W Contrast  09/10/2013   CLINICAL DATA:  Elevated white blood cell count. Abdominal distention. Tarry stools  EXAM: CT ABDOMEN AND PELVIS WITH CONTRAST  TECHNIQUE: Multidetector CT imaging of the abdomen and pelvis was performed using the standard protocol following bolus administration of intravenous contrast.  CONTRAST:  154mL OMNIPAQUE IOHEXOL 300 MG/ML  SOLN  COMPARISON:  Chest radiograph 09/02/2013  FINDINGS: Left lateral rib fractures again noted. There bilateral pleural effusions greater on the left. There is bibasilar atelectasis associated these moderate effusions. No pericardial fluid.  There is no focal hepatic lesion. Gallbladder is not identified. Pancreas, spleen, adrenal glands, kidneys are normal. There is significant streak artifact through the abdomen from the posterior lumbar fixation.  Stomach, small bowel, and cecum are normal. Appendix is not identified. The sigmoid colon has multiple diverticula. No acute diverticulitis.  Abdominal aorta is normal caliber with intimal calcification. No retroperitoneal periportal lymphadenopathy.  No free fluid in the pelvis. There is a 5.7 cm cyst associated with the right ovary. Post hysterectomy anatomy. Bladder is normal. No aggressive osseous lesion. Posterior lumbar fusion again noted.  IMPRESSION: 1. No acute abdominal or pelvic findings. No explanation for elevated white blood cell count. 2. Bilateral moderate to large pleural effusions with associated atelectasis. 3. Posterior left rib fractures. 4. Large benign appearing cyst associated with the right ovary. In late postmenopausal  female, recommend abdominal ultrasound further evaluation. This recommendation follows ACR consensus guidelines: White Paper of the ACR Incidental Findings Committee II on Adnexal Findings. J Am Coll Radiol [id number].   Electronically Signed   By: Suzy Bouchard M.D.   On: 09/10/2013 08:36   Dg Chest Port 1 View  09/11/2013   CLINICAL DATA:  Respiratory distress  EXAM: PORTABLE CHEST - 1 VIEW  COMPARISON:  09/10/2013  FINDINGS: Moderate to large left pleural effusion and small to moderate right pleural effusion with associated airspace consolidations, similar to prior. Interstitial and hazy airspace opacities, similar to prior. Mild upper lobe bronchiectasis. No pneumothorax. Left IJ catheter tip projects over the mid SVC. No interval osseous change. Multilevel degenerative changes and curvature of the spine. Multiple left lateral rib fractures.  IMPRESSION: Similar appearance to prior with left greater than right pleural  effusions and associated airspace consolidations ; atelectasis versus pneumonia.  Interstitial and hazy airspace opacities may reflect edema superimposed on chronic changes.   Electronically Signed   By: Carlos Levering M.D.   On: 09/11/2013 06:38   Dg Chest Port 1 View  09/10/2013   CLINICAL DATA:  Central line placement.  EXAM: PORTABLE CHEST - 1 VIEW  COMPARISON:  09/10/2013 at 10:44 a.m.  FINDINGS: Left IJ central line tip:  SVC.  Large left pleural effusion, bilateral airspace opacities, and obscured bilateral hemidiaphragms again noted. Left cardiac margin obscured.  IMPRESSION: 1. Left IJ line tip: Lower SVC. Otherwise stable appearance of the chest.   Electronically Signed   By: Sherryl Barters M.D.   On: 09/10/2013 19:57   Dg Chest Port 1 View  09/10/2013   CLINICAL DATA:  Recent left-sided rib fractures.  EXAM: PORTABLE CHEST - 1 VIEW  COMPARISON:  09/02/2013.  FINDINGS: Interval increase in left-sided pleural effusion noted with fluid now seen in the left apex. Ms.  raises the question of a degree of loculation. Numerous left-sided rib fractures are evident. There is right base atelectasis with probable small right pleural effusion associated. Interstitial markings are diffusely coarsened with chronic features. The cardiopericardial silhouette is within normal limits for size. Bones are diffusely demineralized. Telemetry leads overlie the chest.  IMPRESSION: Interval increase in left-sided pleural effusion which is now moderate to large in size and may be loculated. No evidence for associated pneumothorax.  Persistent right base atelectasis with small right pleural effusion.   Electronically Signed   By: Misty Stanley M.D.   On: 09/10/2013 11:02     ASSESSMENT / PLAN:  PULMONARY A: Bilateral pleural effusions L>R since 08/30/13 following left rib fracture on 08/28/13  - since then dyspnea and pain   - suspect left sided new hemothorax  P:   Supplemental O2 as needed to keep SpO2 > 92% Consider diagnostic thora once coagulopathy resolves. And depending on result possibly chest tube if patient can tolerate. Now DNR and comfort may be a better focus. No procedures till INR <1.3. Follow CXR  CARDIOVASCULAR CVL A:  Hypotension 2nd to hypovolemia and acute blood loss (GIB) Chronic AF on coumadin  P:  IVF hydration Transfuse per ICU guidelines Check lactic acid  CVL 8/31>>  RENAL A:   No acute issues but at risk for ATN  P:   Follow Bmet  GASTROINTESTINAL A:   Acute upper GI bleed - has melena  P:   Protonix gtt Transfuse as below  GI following, will scope once coagulopathy is corrected. May need sooner GI intervention if bp/hr instability get worse unless comfort is main focus NPO  HEMATOLOGIC Lab Results  Component Value Date   INR 1.93* 09/10/2013   INR 3.02* 09/10/2013   INR 1.21 09/03/2013    A:   Acute blood loss anemia 2nd to GIB Coumadin coagulopathy (Vit K given in ED)  P:  Hold coumadin Transfuse per normal ICU  guidelines 2 units PRBC, 1 unit FFP  Trend H&H, q4h Trend Coags PRBC for hgb < 7gm% or unless there is hemodynamic instability  INFECTIOUS A:   SIRS, no obvious source  P:   UC 8/31 >>> Monitor clinically   ENDOCRINE A:   No acute issues    P:   Monitor  NEUROLOGIC A:   Acute encephalopathy, lethargy(resolved 9/1) awake and alert but weak  P:   RASS goal: 0 Monitor   Discussion: Very difficult case in extremely  frail 80 yo female who has been declining since a fall last month. Now a DNR and does not want a chest tube. If we do a thoracentesis and she has a pnx then would expire most likely without chest tube. Comfort may be a better focus for her but MD will discus with family and patient.  Richardson Landry Minor ACNP [facility] PCCM Pager [phone] till 3 pm If no answer page [phone] 09/11/2013, 10:52 AM

## 2013-09-11 NOTE — Progress Notes (Signed)
eLink Physician-Brief Progress Note Patient Name: Vanessa [name] DOB: 09/03/1920 MRN: [id number]   Date of Service  09/11/2013  HPI/Events of Note  Patient admitted with GIB.  Received 3 units pRBC.  Now with some SOB and wheezing.  RR os 28 with sats of 97%.  BP earlier had been soft, now 132/50 (81)  eICU Interventions  Plan: 20 mg lasix IV times one dose now Continue to monitor     Intervention Category Intermediate Interventions: Respiratory distress - evaluation and management  DETERDING,ELIZABETH 09/11/2013, 5:30 AM

## 2013-09-11 NOTE — Progress Notes (Signed)
09/11/13 0500  Vitals  Temp 98.2 F (36.8 C)  Temp src Axillary  BP ! 132/50 mmHg  MAP (mmHg) 81  Pulse Rate 91  ECG Heart Rate ! 101  Resp ! 28  Oxygen Therapy  SpO2 99 %   Dr. Jimmy Footman notified of patient experiencing episode of shortness of breath, tachypnea and wheezing. 3 units of blood products given during shift. 20 mg of IV Lasix ordered. Will give med and continue to monitor patient respiratory status.

## 2013-09-12 DIAGNOSIS — Z5181 Encounter for therapeutic drug level monitoring: Secondary | ICD-10-CM

## 2013-09-12 DIAGNOSIS — Z515 Encounter for palliative care: Secondary | ICD-10-CM

## 2013-09-12 DIAGNOSIS — F411 Generalized anxiety disorder: Secondary | ICD-10-CM

## 2013-09-12 DIAGNOSIS — R42 Dizziness and giddiness: Secondary | ICD-10-CM

## 2013-09-12 DIAGNOSIS — R531 Weakness: Secondary | ICD-10-CM

## 2013-09-12 LAB — TYPE AND SCREEN
ABO/RH(D): B NEG
Antibody Screen: NEGATIVE
Unit division: 0
Unit division: 0

## 2013-09-12 LAB — HEMOGLOBIN AND HEMATOCRIT, BLOOD
HCT: 26.6 % — ABNORMAL LOW (ref 36.0–46.0)
Hemoglobin: 9 g/dL — ABNORMAL LOW (ref 12.0–15.0)

## 2013-09-12 LAB — CBC WITH DIFFERENTIAL/PLATELET
Basophils Absolute: 0 10*3/uL (ref 0.0–0.1)
Basophils Relative: 0 % (ref 0–1)
Eosinophils Absolute: 0.4 10*3/uL (ref 0.0–0.7)
Eosinophils Relative: 3 % (ref 0–5)
HCT: 26.6 % — ABNORMAL LOW (ref 36.0–46.0)
Hemoglobin: 9.1 g/dL — ABNORMAL LOW (ref 12.0–15.0)
Lymphocytes Relative: 13 % (ref 12–46)
Lymphs Abs: 1.8 10*3/uL (ref 0.7–4.0)
MCH: 30.8 pg (ref 26.0–34.0)
MCHC: 34.2 g/dL (ref 30.0–36.0)
MCV: 90.2 fL (ref 78.0–100.0)
Monocytes Absolute: 1.4 10*3/uL — ABNORMAL HIGH (ref 0.1–1.0)
Monocytes Relative: 10 % (ref 3–12)
Neutro Abs: 10.4 10*3/uL — ABNORMAL HIGH (ref 1.7–7.7)
Neutrophils Relative %: 74 % (ref 43–77)
Platelets: 309 10*3/uL (ref 150–400)
RBC: 2.95 MIL/uL — ABNORMAL LOW (ref 3.87–5.11)
RDW: 16.6 % — ABNORMAL HIGH (ref 11.5–15.5)
WBC: 13.9 10*3/uL — ABNORMAL HIGH (ref 4.0–10.5)

## 2013-09-12 LAB — BASIC METABOLIC PANEL
Anion gap: 9 (ref 5–15)
BUN: 33 mg/dL — ABNORMAL HIGH (ref 6–23)
CO2: 30 mEq/L (ref 19–32)
Calcium: 8.6 mg/dL (ref 8.4–10.5)
Chloride: 105 mEq/L (ref 96–112)
Creatinine, Ser: 0.9 mg/dL (ref 0.50–1.10)
GFR calc Af Amer: 63 mL/min — ABNORMAL LOW (ref >90)
GFR calc non Af Amer: 54 mL/min — ABNORMAL LOW (ref >90)
Glucose, Bld: 90 mg/dL (ref 70–99)
Potassium: 3.7 mEq/L (ref 3.7–5.3)
Sodium: 144 mEq/L (ref 137–147)

## 2013-09-12 LAB — MAGNESIUM: Magnesium: 2 mg/dL (ref 1.5–2.5)

## 2013-09-12 LAB — PHOSPHORUS: Phosphorus: 3.2 mg/dL (ref 2.3–4.6)

## 2013-09-12 MED ORDER — ACETAMINOPHEN 325 MG PO TABS: 650.0000 mg | ORAL_TABLET | ORAL | Status: DC | PRN

## 2013-09-12 MED ORDER — ACETAMINOPHEN 160 MG/5ML PO SOLN
650.0000 mg | ORAL | Status: DC | PRN
  Administered 2013-09-12: 650 mg via ORAL
  Filled 2013-09-12: qty 20.3

## 2013-09-12 MED ORDER — ACETAMINOPHEN 160 MG/5ML PO SOLN
650.0000 mg | ORAL | Status: DC | PRN
  Administered 2013-09-12 – 2013-09-14 (×3): 650 mg via ORAL
  Filled 2013-09-12 (×3): qty 20.3

## 2013-09-12 MED ORDER — ENSURE COMPLETE PO LIQD
237.0000 mL | Freq: Two times a day (BID) | ORAL | Status: DC
  Administered 2013-09-12 – 2013-09-15 (×4): 237 mL via ORAL

## 2013-09-12 MED ORDER — LORAZEPAM 0.5 MG PO TABS
0.5000 mg | ORAL_TABLET | Freq: Four times a day (QID) | ORAL | Status: DC | PRN
  Administered 2013-09-12 – 2013-09-15 (×6): 0.5 mg via ORAL
  Filled 2013-09-12 (×6): qty 1

## 2013-09-12 NOTE — Progress Notes (Signed)
PULMONARY / CRITICAL CARE MEDICINE   Name: Vanessa [name] MRN: [id number] DOB: 04-06-1920    ADMISSION DATE:  09/10/2013 CONSULTATION DATE:  09/10/2013  REFERRING MD :  Sheran Fava  CHIEF COMPLAINT:  GI bleed  INITIAL PRESENTATION: 80 year old female presented 8/31 to [facility] ED for dark stools and coffee ground emesis. She takes coumadin for AF. In ED hemoglobin was stable and she was admitted to tele. Now hemoglobin 7. She is being moved to ICU, PCCM asked to see.   STUDIES:  CT abd 8/31 > No acute abdominal or pelvic findings. Bilateral moderate to large pleural effusions with associated atelectasis.Posterior left rib fractures. Large benign appearing cyst associated with the right ovary.   SIGNIFICANT EVENTS: 8/30 admitted 8/31 hgb 7, to ICU. Transfused. 9/1 placed on nimvs 9/1 code staus changed to DNR 9/1 refuses invasive procedures.  HISTORY OF PRESENT ILLNESS:  [name] with extensive PMH which includes a-fib on chronic anticoagulation, carotid artery occlusion, and recurrent falls .   The patient was admitted from 8/18 to 8/24 after a fall which resulted in multiple rib fractures. Her coumadin was stopped and reversed at that time but resumed at discharge. She has had abdominal bloating x 1 week. She now presents with hematemesis and melena.  8/30 she developed dark tarry stools and coffee ground emesis, multiple episodes. She presented to ED 8/31. She was stable and admitted to tele. 8/31 PM hemoglobin continued to fall (7) with bp/hr instability but improved with fluids. . She was moved to ICU and PCCM asked to see and time of evaluation had not received any blood products and has difficult access. Of note, CXR at admission 09/10/2013 showed new left pleural effusion since 08/30/13  compared to 08/28/13 when she was admitted with fractures on same side   VITAL SIGNS: Temp:  [97.5 F (36.4 C)-98.6 F (37 C)] 97.5 F (36.4 C) (09/02 0400) Pulse Rate:  [81-119] 97 (09/02  0800) Resp:  [18-27] 20 (09/02 0800) BP: (92-159)/(34-116) 148/64 mmHg (09/02 0800) SpO2:  [90 %-100 %] 100 % (09/02 0800) Weight:  [115 lb 15.4 oz (52.6 kg)] 115 lb 15.4 oz (52.6 kg) (09/02 0400) HEMODYNAMICS:   VENTILATOR SETTINGS:   INTAKE / OUTPUT:  Intake/Output Summary (Last 24 hours) at 09/12/13 0955 Last data filed at 09/12/13 0900  Gross per 24 hour  Intake    720 ml  Output   2155 ml  Net  -1435 ml    PHYSICAL EXAMINATION: General:  Elderly female in NAD, weak. Neuro:  Alert, oriented. Responds appropriately HEENT:  [place]/AT, no JVD noted Cardiovascular:  RRR Lungs:  Resp's even, unlabored. Diminished L>R, sob with extensive conversations. Abdomen:  Soft, non-tender, mildly distended, hyperactive BS.  Musculoskeletal:  No acute deformity Skin:  Intact, multiple ecchymotic areas.   LABS: PULMONARY No results found for this basename: PHART, PCO2, PCO2ART, PO2, PO2ART, HCO3, TCO2, O2SAT,  in the last 168 hours  CBC  Recent Labs Lab 09/10/13 1921 09/11/13 0500 09/11/13 0911 09/11/13 1600 09/12/13 0530  HGB 7.1* 9.6* 10.6* 9.8* 9.1*  HCT 21.1* 28.3* 31.4* 28.0* 26.6*  WBC 18.7* 13.0*  --   --  13.9*  PLT 379 258  --   --  309    COAGULATION  Recent Labs Lab 09/10/13 0449 09/10/13 1921 09/11/13 1022  INR 3.02* 1.93* 1.27    CARDIAC    Recent Labs Lab 09/11/13 0500  TROPONINI <0.30   No results found for this basename: PROBNP,  in  the last 168 hours   CHEMISTRY  Recent Labs Lab 09/10/13 0449 09/10/13 1921 09/11/13 0500 09/12/13 0530  NA 139 141 142 144  K 4.5 4.4 4.0 3.7  CL 98 105 106 105  CO2 30 25 27 30   GLUCOSE 163* 118* 98 90  BUN 28* 37* 30* 33*  CREATININE 0.88 0.76 0.75 0.90  CALCIUM 9.1 8.3* 8.5 8.6  MG  --  1.8 1.9 2.0  PHOS  --   --  2.6 3.2   Estimated Creatinine Clearance: 32.2 ml/min (by C-G formula based on Cr of 0.9).   LIVER  Recent Labs Lab 09/10/13 0449 09/10/13 1921 09/11/13 1022  AST 19  --   --    ALT 15  --   --   ALKPHOS 105  --   --   BILITOT 0.3  --   --   PROT 6.0  --   --   ALBUMIN 2.4*  --   --   INR 3.02* 1.93* 1.27     INFECTIOUS  Recent Labs Lab 09/10/13 0507 09/10/13 1922  LATICACIDVEN 1.41 0.9     ENDOCRINE CBG (last 3)  No results found for this basename: GLUCAP,  in the last 72 hours       IMAGING x48h Dg Chest Port 1 View  09/11/2013   CLINICAL DATA:  Respiratory distress  EXAM: PORTABLE CHEST - 1 VIEW  COMPARISON:  09/10/2013  FINDINGS: Moderate to large left pleural effusion and small to moderate right pleural effusion with associated airspace consolidations, similar to prior. Interstitial and hazy airspace opacities, similar to prior. Mild upper lobe bronchiectasis. No pneumothorax. Left IJ catheter tip projects over the mid SVC. No interval osseous change. Multilevel degenerative changes and curvature of the spine. Multiple left lateral rib fractures.  IMPRESSION: Similar appearance to prior with left greater than right pleural effusions and associated airspace consolidations ; atelectasis versus pneumonia.  Interstitial and hazy airspace opacities may reflect edema superimposed on chronic changes.   Electronically Signed   By: Carlos Levering M.D.   On: 09/11/2013 06:38   Dg Chest Port 1 View  09/10/2013   CLINICAL DATA:  Central line placement.  EXAM: PORTABLE CHEST - 1 VIEW  COMPARISON:  09/10/2013 at 10:44 a.m.  FINDINGS: Left IJ central line tip:  SVC.  Large left pleural effusion, bilateral airspace opacities, and obscured bilateral hemidiaphragms again noted. Left cardiac margin obscured.  IMPRESSION: 1. Left IJ line tip: Lower SVC. Otherwise stable appearance of the chest.   Electronically Signed   By: Sherryl Barters M.D.   On: 09/10/2013 19:57   Dg Chest Port 1 View  09/10/2013   CLINICAL DATA:  Recent left-sided rib fractures.  EXAM: PORTABLE CHEST - 1 VIEW  COMPARISON:  09/02/2013.  FINDINGS: Interval increase in left-sided pleural effusion  noted with fluid now seen in the left apex. Ms. raises the question of a degree of loculation. Numerous left-sided rib fractures are evident. There is right base atelectasis with probable small right pleural effusion associated. Interstitial markings are diffusely coarsened with chronic features. The cardiopericardial silhouette is within normal limits for size. Bones are diffusely demineralized. Telemetry leads overlie the chest.  IMPRESSION: Interval increase in left-sided pleural effusion which is now moderate to large in size and may be loculated. No evidence for associated pneumothorax.  Persistent right base atelectasis with small right pleural effusion.   Electronically Signed   By: Misty Stanley M.D.   On: 09/10/2013 11:02  ASSESSMENT / PLAN:  PULMONARY A: Bilateral pleural effusions L>R since 08/30/13 following left rib fracture on 08/28/13  - since then dyspnea and pain   - suspect left sided new hemothorax  P:   Supplemental O2 as needed to keep SpO2 > 92% Consider diagnostic thora once coagulopathy resolves. And depending on result possibly chest tube if patient can tolerate. Now DNR and comfort may be a better focus. No procedures per Pt request Follow CXR  CARDIOVASCULAR CVL A:  Hypotension 2nd to hypovolemia and acute blood loss (GIB) Chronic AF on coumadin  P:  IVF hydration Transfuse per ICU guidelines  CVL 8/31>>  RENAL A:   No acute issues but at risk for ATN  P:   Follow Bmet  GASTROINTESTINAL A:   Acute upper GI bleed - has melena  P:   Protonix gtt Transfuse as below  GI following, no scope planned   HEMATOLOGIC Lab Results  Component Value Date   INR 1.27 09/11/2013   INR 1.93* 09/10/2013   INR 3.02* 09/10/2013    A:   Acute blood loss anemia 2nd to GIB Coumadin coagulopathy (Vit K given in ED)  P:  Hold coumadin Transfuse per normal ICU guidelines 2 units PRBC, 1 unit FFP  Trend H&H, q4h Trend Coags PRBC for hgb < 7gm% or unless  there is hemodynamic instability  INFECTIOUS A:   SIRS, no obvious source  P:   UC 8/31 >>>neg Monitor clinically   ENDOCRINE A:   No acute issues    P:   Monitor  NEUROLOGIC A:   Acute encephalopathy, lethargy(resolved 9/1) awake and alert but weak  P:   RASS goal: 0 Monitor   Discussion: Very difficult case in extremely frail 80 yo female who has been declining since a fall last month. Now a DNR and does not want a chest tube. No further invasive procedures. PCCM will; sign off.   Richardson Landry Lysle Yero ACNP [facility] P[facility] Pager [phone] till 3 pm If no answer page [phone] 09/12/2013, 9:55 AM

## 2013-09-12 NOTE — Progress Notes (Addendum)
Patient seen and examined with NP S. Minor.  Lab, images, and vitals reviewed. Agree with NP S. Minor assessment and plan with the following exceptions:  1. Dyspnea - multifactorial  - rib fx, deconditioning, b\l pleural effusion, anemia, possible L hemothorax  - cont with supplemental O2, currently on anticoagulation for A.fib, now reversed - patient and family does not want any further intervention (see previous note for family discussion). - pain management for rib fx  - patient is now DNR   2. Anemia - blood loss  - possible GIB and hemothorax  - s\p 2u prbc  - follow H\H  3. A.fib - stop coumadin - consult cardiology   Thank you for consulting [facility], we will signoff today, please feel free to contact us with any questions.    Consult Time = 35 mins  Vilinda Boehringer, MD [facility] Pager [phone] On Call Pager [phone]

## 2013-09-12 NOTE — Progress Notes (Signed)
NUTRITION FOLLOW UP  Intervention:   - Ensure Complete BID - Diet advancement per MD - RD to continue to monitor   Nutrition Dx:   Inadequate oral intake related to inability to eat as evidenced by NPO - ongoing now related to poor appetite as evidenced by 25% meal intake   Goal:   Advance diet as tolerated to dysphagia/1thin - not met, only advanced to full liquids  New goal: Pt to consume >90% of meals/supplements    Monitor:   Weights, labs, intake, diet advancement, nausea   Assessment:   Pt with history of a-fib on chronic anticoagulation, carotid artery occlusion, HLD, and recurrent falls who presents with hematemesis and melena. The patient was admitted from 8/18 to 8/24 after a fall which resulted in multiple rib fractures. She has had abdominal bloating x 1 week. On the night PTA, she developed dark tarry stools and coffee ground emesis, multiple episodes. Had new wheeze/SOB day of admission.   9/1: - Found to have GI bleed and bilateral pleural effusions  - GI following  - Met with pt and caregiver who report pt's intake has been down x 3 weeks  - Pt on puree/thin diet PTA with 25% meal intake  - Was drinking 1 Ensure/day  - Caregiver reports pt's weight relatively stable   9/2: - Palliative care met with pt this morning - Plan is for DNR/DNI with no artifical feeding now or in the future - Diet advanced to full liquid yesterday  - Met with pt who reports all she wanted this morning was some milk which she was consuming - Denied any nausea   Height: Ht Readings from Last 1 Encounters:  09/10/13 5' 2" (1.575 m)    Weight Status:   Wt Readings from Last 1 Encounters:  09/12/13 115 lb 15.4 oz (52.6 kg)  Admit wt         112 lb (50.8 kg)  Re-estimated needs:  Kcal: 1250-1450  Protein: 60-80g  Fluid: 1.2-1.4L/day    Skin: intact    Diet Order: Full Liquid   Intake/Output Summary (Last 24 hours) at 09/12/13 1102 Last data filed at 09/12/13 1000  Gross per 24 hour  Intake   1130 ml  Output   2280 ml  Net  -1150 ml    Last BM: 9/1   Labs:   Recent Labs Lab 09/10/13 1921 09/11/13 0500 09/12/13 0530  NA 141 142 144  K 4.4 4.0 3.7  CL 105 106 105  CO2 _0 BUN 37* 30* 33*  CREATININE 0.76 0.75 0.90  CALCIUM 8.3* 8.5 8.6  MG 1.8 1.9 2.0  PHOS  --  2.6 3.2  GLUCOSE 118* 98 90    CBG (last 3)  No results found for this basename: GLUCAP,  in the last 72 hours  Scheduled Meds: . sodium chloride   Intravenous Once  . sodium chloride   Intravenous Once  . [START ON 09/14/2013] pantoprazole (PROTONIX) IV  40 mg Intravenous Q12H  . phytonadione  5 mg Oral Daily  . sodium chloride  3 mL Intravenous Q12H  . sucralfate  1 g Oral TID WC & HS    Continuous Infusions: . pantoprozole (PROTONIX) infusion 8 mg/hr (09/12/13 0600)     Carlis Stable MS, RD, LDN [phone] Pager [phone] Weekend/After Hours Pager

## 2013-09-12 NOTE — Progress Notes (Signed)
[facility] Progress Note  Subjective: No signs of active GI bleeding today. Discussed medical management of GI bleeding with patient and her son. They are in agreement and we also to avoid endoscopy. We will not plan endoscopy unless acute intervention needed.  Objective: Vital signs in last 24 hours: Temp:  [97.5 F (36.4 C)-98.6 F (37 C)] 97.5 F (36.4 C) (09/02 0400) Pulse Rate:  [81-119] 97 (09/02 0800) Resp:  [18-27] 20 (09/02 0800) BP: (92-159)/(34-116) 148/64 mmHg (09/02 0800) SpO2:  [94 %-100 %] 99 % (09/02 1000) Weight:  [52.6 kg (115 lb 15.4 oz)] 52.6 kg (115 lb 15.4 oz) (09/02 0400) Weight change: 1.797 kg (3 lb 15.4 oz)   PE:  Alert.  Heart irregular rhythm  Lungs clear  Abdomen soft nontender    Lab Results: Results for orders placed during the hospital encounter of 09/10/13 (from the past 24 hour(s))  HEMOGLOBIN AND HEMATOCRIT, BLOOD     Status: Abnormal   Collection Time    09/11/13  4:00 PM      Result Value Ref Range   Hemoglobin 9.8 (*) 12.0 - 15.0 g/dL   HCT 28.0 (*) 36.0 - 46.0 %  PHOSPHORUS     Status: None   Collection Time    09/12/13  5:30 AM      Result Value Ref Range   Phosphorus 3.2  2.3 - 4.6 mg/dL  BASIC METABOLIC PANEL     Status: Abnormal   Collection Time    09/12/13  5:30 AM      Result Value Ref Range   Sodium 144  137 - 147 mEq/L   Potassium 3.7  3.7 - 5.3 mEq/L   Chloride 105  96 - 112 mEq/L   CO2 30  19 - 32 mEq/L   Glucose, Bld 90  70 - 99 mg/dL   BUN 33 (*) 6 - 23 mg/dL   Creatinine, Ser 0.90  0.50 - 1.10 mg/dL   Calcium 8.6  8.4 - 10.5 mg/dL   GFR calc non Af Amer 54 (*) >90 mL/min   GFR calc Af Amer 63 (*) >90 mL/min   Anion gap 9  5 - 15  MAGNESIUM     Status: None   Collection Time    09/12/13  5:30 AM      Result Value Ref Range   Magnesium 2.0  1.5 - 2.5 mg/dL  CBC WITH DIFFERENTIAL     Status: Abnormal   Collection Time    09/12/13  5:30 AM      Result Value Ref Range   WBC 13.9 (*) 4.0 -  10.5 K/uL   RBC 2.95 (*) 3.87 - 5.11 MIL/uL   Hemoglobin 9.1 (*) 12.0 - 15.0 g/dL   HCT 26.6 (*) 36.0 - 46.0 %   MCV 90.2  78.0 - 100.0 fL   MCH 30.8  26.0 - 34.0 pg   MCHC 34.2  30.0 - 36.0 g/dL   RDW 16.6 (*) 11.5 - 15.5 %   Platelets 309  150 - 400 K/uL   Neutrophils Relative % 74  43 - 77 %   Neutro Abs 10.4 (*) 1.7 - 7.7 K/uL   Lymphocytes Relative 13  12 - 46 %   Lymphs Abs 1.8  0.7 - 4.0 K/uL   Monocytes Relative 10  3 - 12 %   Monocytes Absolute 1.4 (*) 0.1 - 1.0 K/uL   Eosinophils Relative 3  0 - 5 %   Eosinophils Absolute 0.4  0.0 - 0.7 K/uL   Basophils Relative 0  0 - 1 %   Basophils Absolute 0.0  0.0 - 0.1 K/uL    Studies/Results: Dg Chest Port 1 View  09/11/2013   CLINICAL DATA:  Respiratory distress  EXAM: PORTABLE CHEST - 1 VIEW  COMPARISON:  09/10/2013  FINDINGS: Moderate to large left pleural effusion and small to moderate right pleural effusion with associated airspace consolidations, similar to prior. Interstitial and hazy airspace opacities, similar to prior. Mild upper lobe bronchiectasis. No pneumothorax. Left IJ catheter tip projects over the mid SVC. No interval osseous change. Multilevel degenerative changes and curvature of the spine. Multiple left lateral rib fractures.  IMPRESSION: Similar appearance to prior with left greater than right pleural effusions and associated airspace consolidations ; atelectasis versus pneumonia.  Interstitial and hazy airspace opacities may reflect edema superimposed on chronic changes.   Electronically Signed   By: Carlos Levering M.D.   On: 09/11/2013 06:38      Assessment: Gastrointestinal bleeding. This is most likely secondary to irritation in the stomach from NSAIDs coupled with the fact that she was on Coumadin.  Plan: Continue medical management. PPI therapy. Avoid endoscopy unless acute therapeutic intervention required    Eston Heslin F 09/12/2013, 11:22 AM  Lab Results  Component Value Date   HGB 9.1* 09/12/2013    HGB 9.8* 09/11/2013   HGB 10.6* 09/11/2013   HGB 14.0 07/27/2011   HGB 13.8 06/23/2011   HCT 26.6* 09/12/2013   HCT 28.0* 09/11/2013   HCT 31.4* 09/11/2013   HCT 45.1 07/27/2011   HCT 43.9 06/23/2011   ALKPHOS 105 09/10/2013   ALKPHOS 92 07/09/2013   ALKPHOS 95 07/20/2012   AST 19 09/10/2013   AST 28 07/09/2013   AST 27 07/20/2012   ALT 15 09/10/2013   ALT 14 07/09/2013   ALT 17 07/20/2012

## 2013-09-12 NOTE — Progress Notes (Signed)
Progress Note from the Palliative Medicine Team at [facility]:  -pateint is alert and oriented, c/o "feeling very nervous"  -son Michaella Imai at bedside, questions and concerns addressed, family remains hopeful for improvement   -PMT will continue to support holistically  Objective: Allergies  Allergen Reactions  . Tape Other (See Comments)    Tears skin. Band-Aids   Scheduled Meds: . sodium chloride   Intravenous Once  . sodium chloride   Intravenous Once  . [START ON 09/14/2013] pantoprazole (PROTONIX) IV  40 mg Intravenous Q12H  . phytonadione  5 mg Oral Daily  . sodium chloride  3 mL Intravenous Q12H  . sucralfate  1 g Oral TID WC & HS   Continuous Infusions: . pantoprozole (PROTONIX) infusion 8 mg/hr (09/12/13 0600)   PRN Meds:.acetaminophen (TYLENOL) oral liquid 160 mg/5 mL, acetaminophen, LORazepam, morphine injection, ondansetron (ZOFRAN) IV  BP 148/64  Pulse 97  Temp(Src) 97.5 F (36.4 C) (Oral)  Resp 20  Ht 5\' 2"  (1.575 m)  Wt 52.6 kg (115 lb 15.4 oz)  BMI 21.20 kg/m2  SpO2 99%   PPS:30 % at best  Pain Score:denies    Intake/Output Summary (Last 24 hours) at 09/12/13 1104 Last data filed at 09/12/13 1000  Gross per 24 hour  Intake   1130 ml  Output   2280 ml  Net  -1150 ml       Physical Exam:  General: chronically ill appearing, restless  HEENT: Dry buccal membranes, no exudate  Chest: Decreased in bases, CTA  CVS: tachycardic Abdomen:soft NT +BS  Ext: without edema  Neuro: alert and oriented X3, with insight into her situation   Labs: CBC    Component Value Date/Time   WBC 13.9* 09/12/2013 0530   WBC 8.1 07/27/2011 1149   RBC 2.95* 09/12/2013 0530   RBC 4.47 07/27/2011 1149   HGB 9.1* 09/12/2013 0530   HGB 14.0 07/27/2011 1149   HCT 26.6* 09/12/2013 0530   HCT 45.1 07/27/2011 1149   PLT 309 09/12/2013 0530   MCV 90.2 09/12/2013 0530   MCV 101* 07/27/2011 1149   MCH 30.8 09/12/2013 0530   MCH 31.3* 07/27/2011 1149   MCHC 34.2  09/12/2013 0530   MCHC 31.0* 07/27/2011 1149   RDW 16.6* 09/12/2013 0530   LYMPHSABS 1.8 09/12/2013 0530   MONOABS 1.4* 09/12/2013 0530   EOSABS 0.4 09/12/2013 0530   BASOSABS 0.0 09/12/2013 0530    BMET    Component Value Date/Time   NA 144 09/12/2013 0530   K 3.7 09/12/2013 0530   CL 105 09/12/2013 0530   CO2 30 09/12/2013 0530   GLUCOSE 90 09/12/2013 0530   BUN 33* 09/12/2013 0530   CREATININE 0.90 09/12/2013 0530   CREATININE 0.95 06/23/2011 1056   CALCIUM 8.6 09/12/2013 0530   GFRNONAA 54* 09/12/2013 0530   GFRAA 63* 09/12/2013 0530    CMP     Component Value Date/Time   NA 144 09/12/2013 0530   K 3.7 09/12/2013 0530   CL 105 09/12/2013 0530   CO2 30 09/12/2013 0530   GLUCOSE 90 09/12/2013 0530   BUN 33* 09/12/2013 0530   CREATININE 0.90 09/12/2013 0530   CREATININE 0.95 06/23/2011 1056   CALCIUM 8.6 09/12/2013 0530   PROT 6.0 09/10/2013 0449   ALBUMIN 2.4* 09/10/2013 0449   AST 19 09/10/2013 0449   ALT 15 09/10/2013 0449   ALKPHOS 105 09/10/2013 0449   BILITOT 0.3 09/10/2013 0449   GFRNONAA 54* 09/12/2013 0530  GFRAA 63* 09/12/2013 0530     Assessment and Plan: 1. Code Status:DNR/DNI 2. Symptom Control:         Anxiety: Ativan 0.5 mg every 6 hrs prn  3. Psycho/Social: Emotional support offered to patient and her son 4. Spiritual:  strong community church support 5. Disposition: Dependant on outcomes  Patient Documents Completed or Given: Document Given Completed  Advanced Directives Pkt    MOST    DNR    Gone from My Sight    Hard Choices yes     Time In Time Out Total Time Spent with Patient Total Overall Time  1025 1045 20 min 20 min    Greater than 50%  of this time was spent counseling and coordinating care related to the above assessment and plan.  Wadie Lessen NP  Palliative Medicine Team Team Phone # [phone] Pager [phone]  Discussed with Dr Hartford Poli 1

## 2013-09-12 NOTE — Progress Notes (Signed)
[facility] PROGRESS NOTE  Vanessa [name] QIO:[id number] DOB: 1920-12-20 DOA: 09/10/2013 PCP: Ellsworth Lennox, MD  HPI/Subjective: Seen with son at bedside, denies any complaints. Await palliative medicine team recommendations.  Assessment/Plan  Upper GIB likely due to gastritis or peptic ulcer from naproxen, hgb now increased to 9.6 after blood transfusion - resume protonix infusion now that IV access established - NPO - hgb q6 hour for now  - Appreciate GI assistance, patient does not want EGD to be done. - Vitamin K given in ER, continue to hold coumadin, INR is 1.27 today.  Mild hypotension, now resolved after blood transfusions - Hold BP medications   Acute hypoxic respiratory failure likely secondary to bilateral pleural effusions noted on CT which developed post-rib fractures during last admission. Mild hypoalbuminemia.  Placed on bipap overnight and received lasix. - CXR with left greater than right pleural effusions and airspace consolidations, possible pneumonia - Consider diagnostic thoracentesis once INR < 1.6  - ECHO:  EF 60-65% with normal wall motion and difficult to discern diastolic dysfunction - IS  -1 dose of IV Lasix was given yesterday, oxygen list of his today.  Ovarian cyst, will need pelvic US eventually. Hold due to acute illness   Rib fractures, still in pain - IV morphine as needed   Recurrent falls  - PT/OT if she clinically improves  A-fib, Tele:  A-fib around 100bpm -  Consider digoxin temporarily for rate control  - Hold warfarin  - received 1 unit FFP - Continue vitamin K  - Recommend against resuming warfarin anytime soon   HLD, stable, continue statin   Peripheral neuropathy, stable, continue tylenol prn   Anxiety, stable continue prn xanax   Leukocytosis likely reactive from acute anemia  - CXR:  Possible pneumonia on repeat CXR (although more likely due to pulmonary edema) - UA:  Neg  Acute blood loss anemia, improved  with blood transfusions - Transfuse for symptomatic anemia or hgb < 7   Thrombocytosis, likely reactive from GIB, resolved.  Diet:  NPO Access:  PIV IVF:  off Proph:  SCD  Code Status: DNR/DNI Family Communication: Spoke with the son at bedside, waiting palliative care recommendation Disposition Plan: continue SDU   Consultants:  GI, Dr. Penelope Coop  PCCM, Dr. Chase Caller  Procedures:  CVC left IJ on 8/31  Bipap trial 9/1  Antibiotics:  None     Objective: Filed Vitals:   09/12/13 0000 09/12/13 0400 09/12/13 0600 09/12/13 0800  BP: 150/116 92/35 159/75 148/64  Pulse: 100 81 114 97  Temp: 98.6 F (37 C) 97.5 F (36.4 C)    TempSrc: Oral Oral    Resp: 21 18 22 20   Height:      Weight:  52.6 kg (115 lb 15.4 oz)    SpO2: 100% 96% 100% 100%    Intake/Output Summary (Last 24 hours) at 09/12/13 1043 Last data filed at 09/12/13 0956  Gross per 24 hour  Intake   1040 ml  Output   2155 ml  Net  -1115 ml   Filed Weights   09/10/13 0417 09/10/13 1053 09/12/13 0400  Weight: 50.803 kg (112 lb) 50.803 kg (112 lb) 52.6 kg (115 lb 15.4 oz)    Exam:   General:  Thin WF, clearly uncomfortable with face mask in place  HEENT:  NCAT, cracked lips  Cardiovascular:  IRRR, nl S1, S2 no mrg, 2+ pulses, warm extremities  Respiratory:  bipap in place.  Diminished at left base, no obvious rales heard, no increased  WOB  Abdomen:   Hyperactive BS, soft, mildly distended and diffuse TTP without rebound or guarding.  Has nausea with abdominal palpation.  MSK:   Normal tone and bulk, no LEE.  Pain with movement of the left shoulder due to posterior rib pain.  Neuro:  Grossly moves all extremities  Data Reviewed: Basic Metabolic Panel:  Recent Labs Lab 09/10/13 0449 09/10/13 1921 09/11/13 0500 09/12/13 0530  NA 139 141 142 144  K 4.5 4.4 4.0 3.7  CL 98 105 106 105  CO2 30 25 27 30   GLUCOSE 163* 118* 98 90  BUN 28* 37* 30* 33*  CREATININE 0.88 0.76 0.75 0.90  CALCIUM  9.1 8.3* 8.5 8.6  MG  --  1.8 1.9 2.0  PHOS  --   --  2.6 3.2   Liver Function Tests:  Recent Labs Lab 09/10/13 0449  AST 19  ALT 15  ALKPHOS 105  BILITOT 0.3  PROT 6.0  ALBUMIN 2.4*   No results found for this basename: LIPASE, AMYLASE,  in the last 168 hours No results found for this basename: AMMONIA,  in the last 168 hours CBC:  Recent Labs Lab 09/10/13 0449  09/10/13 1921 09/11/13 0500 09/11/13 0911 09/11/13 1600 09/12/13 0530  WBC 17.9*  --  18.7* 13.0*  --   --  13.9*  NEUTROABS 15.3*  --  14.8* 9.5*  --   --  10.4*  HGB 10.1*  < > 7.1* 9.6* 10.6* 9.8* 9.1*  HCT 30.3*  < > 21.1* 28.3* 31.4* 28.0* 26.6*  MCV 95.0  --  95.5 91.9  --   --  90.2  PLT 444*  --  379 258  --   --  309  < > = values in this interval not displayed. Cardiac Enzymes:  Recent Labs Lab 09/11/13 0500  TROPONINI <0.30   BNP (last 3 results) No results found for this basename: PROBNP,  in the last 8760 hours CBG: No results found for this basename: GLUCAP,  in the last 168 hours  Recent Results (from the past 240 hour(s))  URINE CULTURE     Status: None   Collection Time    09/10/13 11:09 AM      Result Value Ref Range Status   Specimen Description URINE, CLEAN CATCH   Final   Special Requests NONE   Final   Culture  Setup Time     Final   Value: 09/10/2013 16:00     Performed at [facility]     Final   Value: 50,000 COLONIES/ML     Performed at [facility]   Culture     Final   Value: Multiple bacterial morphotypes present, none predominant. Suggest appropriate recollection if clinically indicated.     Performed at [facility]   Report Status 09/11/2013 FINAL   Final  MRSA PCR SCREENING     Status: None   Collection Time    09/10/13  6:43 PM      Result Value Ref Range Status   MRSA by PCR NEGATIVE  NEGATIVE Final   Comment:            The GeneXpert MRSA Assay (FDA     approved for NASAL specimens     only), is one component of  a     comprehensive MRSA colonization     surveillance program. It is not     intended to diagnose MRSA  infection nor to guide or     monitor treatment for     MRSA infections.     Studies: Dg Chest Port 1 View  09/11/2013   CLINICAL DATA:  Respiratory distress  EXAM: PORTABLE CHEST - 1 VIEW  COMPARISON:  09/10/2013  FINDINGS: Moderate to large left pleural effusion and small to moderate right pleural effusion with associated airspace consolidations, similar to prior. Interstitial and hazy airspace opacities, similar to prior. Mild upper lobe bronchiectasis. No pneumothorax. Left IJ catheter tip projects over the mid SVC. No interval osseous change. Multilevel degenerative changes and curvature of the spine. Multiple left lateral rib fractures.  IMPRESSION: Similar appearance to prior with left greater than right pleural effusions and associated airspace consolidations ; atelectasis versus pneumonia.  Interstitial and hazy airspace opacities may reflect edema superimposed on chronic changes.   Electronically Signed   By: Carlos Levering M.D.   On: 09/11/2013 06:38   Dg Chest Port 1 View  09/10/2013   CLINICAL DATA:  Central line placement.  EXAM: PORTABLE CHEST - 1 VIEW  COMPARISON:  09/10/2013 at 10:44 a.m.  FINDINGS: Left IJ central line tip:  SVC.  Large left pleural effusion, bilateral airspace opacities, and obscured bilateral hemidiaphragms again noted. Left cardiac margin obscured.  IMPRESSION: 1. Left IJ line tip: Lower SVC. Otherwise stable appearance of the chest.   Electronically Signed   By: Sherryl Barters M.D.   On: 09/10/2013 19:57   Dg Chest Port 1 View  09/10/2013   CLINICAL DATA:  Recent left-sided rib fractures.  EXAM: PORTABLE CHEST - 1 VIEW  COMPARISON:  09/02/2013.  FINDINGS: Interval increase in left-sided pleural effusion noted with fluid now seen in the left apex. Ms. raises the question of a degree of loculation. Numerous left-sided rib fractures are evident. There is  right base atelectasis with probable small right pleural effusion associated. Interstitial markings are diffusely coarsened with chronic features. The cardiopericardial silhouette is within normal limits for size. Bones are diffusely demineralized. Telemetry leads overlie the chest.  IMPRESSION: Interval increase in left-sided pleural effusion which is now moderate to large in size and may be loculated. No evidence for associated pneumothorax.  Persistent right base atelectasis with small right pleural effusion.   Electronically Signed   By: Misty Stanley M.D.   On: 09/10/2013 11:02    Scheduled Meds: . sodium chloride   Intravenous Once  . sodium chloride   Intravenous Once  . [START ON 09/14/2013] pantoprazole (PROTONIX) IV  40 mg Intravenous Q12H  . phytonadione  5 mg Oral Daily  . sodium chloride  3 mL Intravenous Q12H  . sucralfate  1 g Oral TID WC & HS   Continuous Infusions: . pantoprozole (PROTONIX) infusion 8 mg/hr (09/12/13 0600)    Active Problems:   Atrial fibrillation   Multiple rib fractures   GI bleed   Acute blood loss anemia   Pleural effusion   Acute respiratory failure with hypoxia   Other and unspecified ovarian cyst   Hypotension (arterial)   Pleural effusion, left    Time spent: 35 min    [facility] Pager [phone]. If 7PM-7AM, please contact night-coverage at [email or web address], password [facility] 09/12/2013, 10:43 AM  LOS: 2 days

## 2013-09-13 DIAGNOSIS — R0609 Other forms of dyspnea: Secondary | ICD-10-CM

## 2013-09-13 DIAGNOSIS — K922 Gastrointestinal hemorrhage, unspecified: Secondary | ICD-10-CM

## 2013-09-13 DIAGNOSIS — R0989 Other specified symptoms and signs involving the circulatory and respiratory systems: Secondary | ICD-10-CM

## 2013-09-13 LAB — BASIC METABOLIC PANEL
Anion gap: 9 (ref 5–15)
BUN: 25 mg/dL — ABNORMAL HIGH (ref 6–23)
CO2: 28 mEq/L (ref 19–32)
Calcium: 8.7 mg/dL (ref 8.4–10.5)
Chloride: 105 mEq/L (ref 96–112)
Creatinine, Ser: 0.78 mg/dL (ref 0.50–1.10)
GFR calc Af Amer: 82 mL/min — ABNORMAL LOW (ref >90)
GFR calc non Af Amer: 71 mL/min — ABNORMAL LOW (ref >90)
Glucose, Bld: 109 mg/dL — ABNORMAL HIGH (ref 70–99)
Potassium: 3.6 mEq/L — ABNORMAL LOW (ref 3.7–5.3)
Sodium: 142 mEq/L (ref 137–147)

## 2013-09-13 LAB — CBC WITH DIFFERENTIAL/PLATELET
Basophils Absolute: 0 10*3/uL (ref 0.0–0.1)
Basophils Relative: 0 % (ref 0–1)
Eosinophils Absolute: 0.4 10*3/uL (ref 0.0–0.7)
Eosinophils Relative: 3 % (ref 0–5)
HCT: 25.5 % — ABNORMAL LOW (ref 36.0–46.0)
Hemoglobin: 8.6 g/dL — ABNORMAL LOW (ref 12.0–15.0)
Lymphocytes Relative: 10 % — ABNORMAL LOW (ref 12–46)
Lymphs Abs: 1.1 10*3/uL (ref 0.7–4.0)
MCH: 31 pg (ref 26.0–34.0)
MCHC: 33.7 g/dL (ref 30.0–36.0)
MCV: 92.1 fL (ref 78.0–100.0)
Monocytes Absolute: 1 10*3/uL (ref 0.1–1.0)
Monocytes Relative: 9 % (ref 3–12)
Neutro Abs: 9.1 10*3/uL — ABNORMAL HIGH (ref 1.7–7.7)
Neutrophils Relative %: 78 % — ABNORMAL HIGH (ref 43–77)
Platelets: 293 10*3/uL (ref 150–400)
RBC: 2.77 MIL/uL — ABNORMAL LOW (ref 3.87–5.11)
RDW: 16.2 % — ABNORMAL HIGH (ref 11.5–15.5)
WBC: 11.7 10*3/uL — ABNORMAL HIGH (ref 4.0–10.5)

## 2013-09-13 LAB — MAGNESIUM: Magnesium: 1.9 mg/dL (ref 1.5–2.5)

## 2013-09-13 LAB — PHOSPHORUS: Phosphorus: 3.1 mg/dL (ref 2.3–4.6)

## 2013-09-13 MED ORDER — PANTOPRAZOLE SODIUM 40 MG PO PACK
40.0000 mg | PACK | Freq: Two times a day (BID) | ORAL | Status: DC
  Administered 2013-09-13 – 2013-09-14 (×2): 40 mg
  Filled 2013-09-13 (×8): qty 20

## 2013-09-13 MED ORDER — POTASSIUM CHLORIDE 20 MEQ/15ML (10%) PO LIQD: 40.0000 meq | Freq: Once | ORAL | Status: DC

## 2013-09-13 MED ORDER — MORPHINE SULFATE (CONCENTRATE) 10 MG /0.5 ML PO SOLN: 5.0000 mg | ORAL | Status: DC | PRN

## 2013-09-13 MED ORDER — FUROSEMIDE 10 MG/ML IJ SOLN
40.0000 mg | Freq: Once | INTRAMUSCULAR | Status: AC
Start: 2013-09-13 — End: 2013-09-13
  Administered 2013-09-13: 40 mg via INTRAVENOUS
  Filled 2013-09-13: qty 4

## 2013-09-13 MED ORDER — POTASSIUM CHLORIDE CRYS ER 20 MEQ PO TBCR: 40.0000 meq | EXTENDED_RELEASE_TABLET | Freq: Once | ORAL | Status: DC

## 2013-09-13 MED ORDER — PANTOPRAZOLE SODIUM 40 MG PO TBEC: 40.0000 mg | DELAYED_RELEASE_TABLET | Freq: Two times a day (BID) | ORAL | Status: DC

## 2013-09-13 NOTE — Progress Notes (Signed)
Pt trans from ICU/Stepdown to 1522 today. Pt is from [facility]. Bed is not being held. PN reviewed. Palliative Care Team following. MD notes on 9/3 pt will most likely d/c home with hospice/palliative care  when stable. CSW will continue to follow in case plan changes and placement back at [facility] is needed.  Werner Lean LCSW [phone]

## 2013-09-13 NOTE — Progress Notes (Signed)
CARE MANAGEMENT NOTE 09/13/2013  Patient:  Vanessa [name], Vanessa [name]   Account Number:  [id number]  Date Initiated:  09/13/2013  Documentation initiated by:  Olivene Cookston  Subjective/Objective Assessment:   80 year old female with active gi bleed and anemia requiring bld products     Action/Plan:   [facility]   Anticipated DC Date:  09/14/2013   Anticipated DC Plan:  ASSISTED LIVING / REST HOME  In-house referral  Clinical Social Worker      DC Planning Services  NA      [facility] Choice  NA   Choice offered to / List presented to:  NA   DME arranged  NA      DME agency  NA     [place] arranged  NA      [place] agency  NA   Status of service:  In process, will continue to follow Medicare Important Message given?  NA - LOS <3 / Initial given by admissions (If response is "NO", the following Medicare IM given date fields will be blank) Date Medicare IM given:   Medicare IM given by:   Date Additional Medicare IM given:   Additional Medicare IM given by:    Discharge Disposition:    Per UR Regulation:  Reviewed for med. necessity/level of care/duration of stay  If discussed at [place] of Stay Meetings, dates discussed:    Comments:  Suanne Marker Rosey Eide,RN,BSN,CCM

## 2013-09-13 NOTE — Progress Notes (Signed)
[facility] PROGRESS NOTE  Vanessa [name] KDT:[id number] DOB: 10-Oct-1920 DOA: 09/10/2013 PCP: Ellsworth Lennox, MD  HPI/Subjective: Patient complaining about pain in the left side. She is also hungry and wants it more food.  Assessment/Plan  Upper GIB likely due to gastritis or peptic ulcer from naproxen, hgb now increased to 9.6 after blood transfusion - On Protonix drip, will switch to oral twice a day. - NPO - Check hemoglobin in a.m. It appears that she is not actively bleeding. - Appreciate GI assistance, patient does not want EGD to be done. - Vitamin K given in ER, continue to hold coumadin, INR is 1.27 today.  Acute blood loss anemia, improved with blood transfusions - Transfuse for symptomatic anemia or hgb < 7   Mild hypotension, now resolved after blood transfusions - Hold BP medications   Acute hypoxic respiratory failure likely secondary to bilateral pleural effusions noted on CT which developed post-rib fractures during last admission. Mild hypoalbuminemia.  Placed on bipap overnight and received lasix. - CXR with left greater than right pleural effusions and airspace consolidations, possible pneumonia - Consider diagnostic thoracentesis once INR < 1.6  - ECHO:  EF 60-65% with normal wall motion and difficult to discern diastolic dysfunction  Ovarian cyst, will need pelvic US eventually. Hold due to acute illness   Rib fractures, still in pain - IV morphine as needed   Recurrent falls  - PT/OT if she clinically improves  A-fib, Tele:  A-fib around 100bpm -  Consider digoxin temporarily for rate control  - Hold warfarin  - received 1 unit FFP - Continue vitamin K  - Recommend against resuming warfarin anytime soon   Peripheral neuropathy, stable, continue tylenol prn   Anxiety, stable continue prn xanax   Leukocytosis likely reactive from acute anemia  - CXR:  Possible pneumonia on repeat CXR (although more likely due to pulmonary edema) - UA:   Neg  Thrombocytosis, likely reactive from GIB, resolved.  End-of-life, goals of care -Patient seen by palliative and hospice team, discussed with the family. -Patient will probably go home with palliative/hospice care, palliative team following. -DNR/DNI for now.  Diet:  Will advance to soft diet. Access:  PIV IVF:  off Proph:  SCD  Code Status: DNR/DNI Family Communication:  Disposition Plan: Transfer to [facility].   Consultants:  GI, Dr. Penelope Coop  PCCM, Dr. Chase Caller  Procedures:  CVC left IJ on 8/31  Bipap trial 9/1  Antibiotics:  None     Objective: Filed Vitals:   09/13/13 0452 09/13/13 0643 09/13/13 0800 09/13/13 1000  BP: 148/49 112/37 132/50 134/61  Pulse:      Temp:  97.9 F (36.6 C) 97.9 F (36.6 C)   TempSrc:  Oral Oral   Resp: 21 23 18 30   Height:      Weight:      SpO2: 99% 100% 100% 98%    Intake/Output Summary (Last 24 hours) at 09/13/13 1127 Last data filed at 09/13/13 1000  Gross per 24 hour  Intake   1015 ml  Output    245 ml  Net    770 ml   Filed Weights   09/10/13 1053 09/12/13 0400 09/13/13 0400  Weight: 50.803 kg (112 lb) 52.6 kg (115 lb 15.4 oz) 53.1 kg (117 lb 1 oz)    Exam:   General:  Thin WF, clearly uncomfortable with face mask in place  HEENT:  NCAT, cracked lips  Cardiovascular:  IRRR, nl S1, S2 no mrg, 2+ pulses, warm extremities  Respiratory:  bipap in place.  Diminished at left base, no obvious rales heard, no increased WOB  Abdomen:   Hyperactive BS, soft, mildly distended and diffuse TTP without rebound or guarding.  Has nausea with abdominal palpation.  MSK:   Normal tone and bulk, no LEE.  Pain with movement of the left shoulder due to posterior rib pain.  Neuro:  Grossly moves all extremities  Data Reviewed: Basic Metabolic Panel:  Recent Labs Lab 09/10/13 0449 09/10/13 1921 09/11/13 0500 09/12/13 0530 09/13/13 0600  NA 139 141 142 144 142  K 4.5 4.4 4.0 3.7 3.6*  CL 98 105 106 105 105  CO2  30 25 27 30 28   GLUCOSE 163* 118* 98 90 109*  BUN 28* 37* 30* 33* 25*  CREATININE 0.88 0.76 0.75 0.90 0.78  CALCIUM 9.1 8.3* 8.5 8.6 8.7  MG  --  1.8 1.9 2.0 1.9  PHOS  --   --  2.6 3.2 3.1   Liver Function Tests:  Recent Labs Lab 09/10/13 0449  AST 19  ALT 15  ALKPHOS 105  BILITOT 0.3  PROT 6.0  ALBUMIN 2.4*   No results found for this basename: LIPASE, AMYLASE,  in the last 168 hours No results found for this basename: AMMONIA,  in the last 168 hours CBC:  Recent Labs Lab 09/10/13 0449  09/10/13 1921 09/11/13 0500 09/11/13 0911 09/11/13 1600 09/12/13 0530 09/12/13 1600 09/13/13 0600  WBC 17.9*  --  18.7* 13.0*  --   --  13.9*  --  11.7*  NEUTROABS 15.3*  --  14.8* 9.5*  --   --  10.4*  --  9.1*  HGB 10.1*  < > 7.1* 9.6* 10.6* 9.8* 9.1* 9.0* 8.6*  HCT 30.3*  < > 21.1* 28.3* 31.4* 28.0* 26.6* 26.6* 25.5*  MCV 95.0  --  95.5 91.9  --   --  90.2  --  92.1  PLT 444*  --  379 258  --   --  309  --  293  < > = values in this interval not displayed. Cardiac Enzymes:  Recent Labs Lab 09/11/13 0500  TROPONINI <0.30   BNP (last 3 results) No results found for this basename: PROBNP,  in the last 8760 hours CBG: No results found for this basename: GLUCAP,  in the last 168 hours  Recent Results (from the past 240 hour(s))  URINE CULTURE     Status: None   Collection Time    09/10/13 11:09 AM      Result Value Ref Range Status   Specimen Description URINE, CLEAN CATCH   Final   Special Requests NONE   Final   Culture  Setup Time     Final   Value: 09/10/2013 16:00     Performed at [place]     Final   Value: 50,000 COLONIES/ML     Performed at [facility]   Culture     Final   Value: Multiple bacterial morphotypes present, none predominant. Suggest appropriate recollection if clinically indicated.     Performed at [facility]   Report Status 09/11/2013 FINAL   Final  MRSA PCR SCREENING     Status: None   Collection  Time    09/10/13  6:43 PM      Result Value Ref Range Status   MRSA by PCR NEGATIVE  NEGATIVE Final   Comment:  The GeneXpert MRSA Assay (FDA     approved for NASAL specimens     only), is one component of a     comprehensive MRSA colonization     surveillance program. It is not     intended to diagnose MRSA     infection nor to guide or     monitor treatment for     MRSA infections.     Studies: No results found.  Scheduled Meds: . sodium chloride   Intravenous Once  . sodium chloride   Intravenous Once  . feeding supplement (ENSURE COMPLETE)  237 mL Oral BID BM  . [START ON 09/14/2013] pantoprazole (PROTONIX) IV  40 mg Intravenous Q12H  . phytonadione  5 mg Oral Daily  . sodium chloride  3 mL Intravenous Q12H  . sucralfate  1 g Oral TID WC & HS   Continuous Infusions: . pantoprozole (PROTONIX) infusion 8 mg/hr (09/13/13 0600)    Principal Problem:   GI bleed Active Problems:   Atrial fibrillation   Multiple rib fractures   Acute blood loss anemia   Pleural effusion   Acute respiratory failure with hypoxia   Other and unspecified ovarian cyst   Hypotension (arterial)   Pleural effusion, left   Palliative care encounter   Weakness generalized   Anxiety state, unspecified    Time spent: 35 min    [facility] Pager [phone]. If 7PM-7AM, please contact night-coverage at [email or web address], password [facility] 09/13/2013, 11:27 AM  LOS: 3 days

## 2013-09-13 NOTE — Progress Notes (Signed)
[facility] Progress Note  Subjective: No signs of active GI bleeding  Objective: Vital signs in last 24 hours: Temp:  [97.7 F (36.5 C)-99.2 F (37.3 C)] 97.9 F (36.6 C) (09/03 0800) Pulse Rate:  [99-102] 99 (09/02 2234) Resp:  [18-23] 18 (09/03 0800) BP: (109-148)/(36-50) 132/50 mmHg (09/03 0800) SpO2:  [98 %-100 %] 100 % (09/03 0800) Weight:  [53.1 kg (117 lb 1 oz)] 53.1 kg (117 lb 1 oz) (09/03 0400) Weight change: 0.5 kg (1 lb 1.6 oz)   PE: No distress, but short of breath Abdomen soft  Lab Results: Results for orders placed during the hospital encounter of 09/10/13 (from the past 24 hour(s))  HEMOGLOBIN AND HEMATOCRIT, BLOOD     Status: Abnormal   Collection Time    09/12/13  4:00 PM      Result Value Ref Range   Hemoglobin 9.0 (*) 12.0 - 15.0 g/dL   HCT 26.6 (*) 36.0 - 46.0 %  PHOSPHORUS     Status: None   Collection Time    09/13/13  6:00 AM      Result Value Ref Range   Phosphorus 3.1  2.3 - 4.6 mg/dL  BASIC METABOLIC PANEL     Status: Abnormal   Collection Time    09/13/13  6:00 AM      Result Value Ref Range   Sodium 142  137 - 147 mEq/L   Potassium 3.6 (*) 3.7 - 5.3 mEq/L   Chloride 105  96 - 112 mEq/L   CO2 28  19 - 32 mEq/L   Glucose, Bld 109 (*) 70 - 99 mg/dL   BUN 25 (*) 6 - 23 mg/dL   Creatinine, Ser 0.78  0.50 - 1.10 mg/dL   Calcium 8.7  8.4 - 10.5 mg/dL   GFR calc non Af Amer 71 (*) >90 mL/min   GFR calc Af Amer 82 (*) >90 mL/min   Anion gap 9  5 - 15  MAGNESIUM     Status: None   Collection Time    09/13/13  6:00 AM      Result Value Ref Range   Magnesium 1.9  1.5 - 2.5 mg/dL  CBC WITH DIFFERENTIAL     Status: Abnormal   Collection Time    09/13/13  6:00 AM      Result Value Ref Range   WBC 11.7 (*) 4.0 - 10.5 K/uL   RBC 2.77 (*) 3.87 - 5.11 MIL/uL   Hemoglobin 8.6 (*) 12.0 - 15.0 g/dL   HCT 25.5 (*) 36.0 - 46.0 %   MCV 92.1  78.0 - 100.0 fL   MCH 31.0  26.0 - 34.0 pg   MCHC 33.7  30.0 - 36.0 g/dL   RDW 16.2 (*) 11.5 -  15.5 %   Platelets 293  150 - 400 K/uL   Neutrophils Relative % 78 (*) 43 - 77 %   Neutro Abs 9.1 (*) 1.7 - 7.7 K/uL   Lymphocytes Relative 10 (*) 12 - 46 %   Lymphs Abs 1.1  0.7 - 4.0 K/uL   Monocytes Relative 9  3 - 12 %   Monocytes Absolute 1.0  0.1 - 1.0 K/uL   Eosinophils Relative 3  0 - 5 %   Eosinophils Absolute 0.4  0.0 - 0.7 K/uL   Basophils Relative 0  0 - 1 %   Basophils Absolute 0.0  0.0 - 0.1 K/uL    Studies/Results: No results found.    Assessment: GI bleed due  to gastric irritation from NSAIDS, and Coumadin.   Plan: Continue to treat medically. Avoid endoscopy unless acute therapeutic intervention needed. Discussed with family and they agree.    Vanessa [name] F 09/13/2013, 10:33 AM

## 2013-09-13 NOTE — Progress Notes (Signed)
Progress Note from the Palliative Medicine Team at [facility]:  -pateint is lethargic and sleepy  -meet with sons Donnah Levert and Ashanna Heinsohn at bedside, continued conversation regarding [place] and options  -plan is to shift to full comfort path     Objective: Allergies  Allergen Reactions  . Tape Other (See Comments)    Tears skin. Band-Aids   Scheduled Meds: . sodium chloride   Intravenous Once  . sodium chloride   Intravenous Once  . feeding supplement (ENSURE COMPLETE)  237 mL Oral BID BM  . [START ON 09/14/2013] pantoprazole (PROTONIX) IV  40 mg Intravenous Q12H  . phytonadione  5 mg Oral Daily  . sodium chloride  3 mL Intravenous Q12H  . sucralfate  1 g Oral TID WC & HS   Continuous Infusions: . pantoprozole (PROTONIX) infusion 8 mg/hr (09/13/13 0600)   PRN Meds:.acetaminophen (TYLENOL) oral liquid 160 mg/5 mL, acetaminophen, LORazepam, morphine injection, ondansetron (ZOFRAN) IV  BP 132/50  Pulse 99  Temp(Src) 97.9 F (36.6 C) (Oral)  Resp 18  Ht 5\' 2"  (1.575 m)  Wt 53.1 kg (117 lb 1 oz)  BMI 21.41 kg/m2  SpO2 100%   PPS:30 % at best  Pain Score:denies    Intake/Output Summary (Last 24 hours) at 09/13/13 1029 Last data filed at 09/13/13 0900  Gross per 24 hour  Intake    980 ml  Output    125 ml  Net    855 ml       Physical Exam:  General: chronically ill appearing, lethargic HEENT: Dry buccal membranes, no exudate  Chest: Decreased in bases, CTA  CVS: tachycardic Abdomen:soft NT +BS  Ext: without edema  Neuro: alert and oriented X3, with insight into her situation   Labs: CBC    Component Value Date/Time   WBC 11.7* 09/13/2013 0600   WBC 8.1 07/27/2011 1149   RBC 2.77* 09/13/2013 0600   RBC 4.47 07/27/2011 1149   HGB 8.6* 09/13/2013 0600   HGB 14.0 07/27/2011 1149   HCT 25.5* 09/13/2013 0600   HCT 45.1 07/27/2011 1149   PLT 293 09/13/2013 0600   MCV 92.1 09/13/2013 0600   MCV 101* 07/27/2011 1149   MCH 31.0 09/13/2013 0600   MCH  31.3* 07/27/2011 1149   MCHC 33.7 09/13/2013 0600   MCHC 31.0* 07/27/2011 1149   RDW 16.2* 09/13/2013 0600   LYMPHSABS 1.1 09/13/2013 0600   MONOABS 1.0 09/13/2013 0600   EOSABS 0.4 09/13/2013 0600   BASOSABS 0.0 09/13/2013 0600    BMET    Component Value Date/Time   NA 142 09/13/2013 0600   K 3.6* 09/13/2013 0600   CL 105 09/13/2013 0600   CO2 28 09/13/2013 0600   GLUCOSE 109* 09/13/2013 0600   BUN 25* 09/13/2013 0600   CREATININE 0.78 09/13/2013 0600   CREATININE 0.95 06/23/2011 1056   CALCIUM 8.7 09/13/2013 0600   GFRNONAA 71* 09/13/2013 0600   GFRAA 82* 09/13/2013 0600    CMP     Component Value Date/Time   NA 142 09/13/2013 0600   K 3.6* 09/13/2013 0600   CL 105 09/13/2013 0600   CO2 28 09/13/2013 0600   GLUCOSE 109* 09/13/2013 0600   BUN 25* 09/13/2013 0600   CREATININE 0.78 09/13/2013 0600   CREATININE 0.95 06/23/2011 1056   CALCIUM 8.7 09/13/2013 0600   PROT 6.0 09/10/2013 0449   ALBUMIN 2.4* 09/10/2013 0449   AST 19 09/10/2013 0449   ALT 15 09/10/2013 0449  ALKPHOS 105 09/10/2013 0449   BILITOT 0.3 09/10/2013 0449   GFRNONAA 71* 09/13/2013 0600   GFRAA 82* 09/13/2013 0600     Assessment and Plan: 1. Code Status:DNR/DNI-  Focus of care is full comfort--MOST form completed -no further diagnostics, IV fluids, labs -minimize medications, dc central line, leave foley in place for comfort -diet as tolerated  2. Symptom Control:         Anxiety: Ativan 0.5 mg every 6 hrs prn         Pain/Dyspnea: Roxanol 5 mg evry 2 hrs prn  3. Psycho/Social: Emotional support offered to patient and her son 4. Spiritual:  strong community church support 5. Disposition: Evaluate in morning, assess for hospice eligibility.  Family is contemplating logistics of variuos disposition plans. Home with hospice vs residential vs SNF  Patient Documents Completed or Given: Document Given Completed  Advanced Directives Pkt    MOST  yes  DNR    Gone from My Sight    Hard Choices yes     Time In Time Out Total Time Spent with Patient  Total Overall Time  0800 0900 60 min 60 min    Greater than 50%  of this time was spent counseling and coordinating care related to the above assessment and plan.  Wadie Lessen NP  Palliative Medicine Team Team Phone # [phone] Pager [phone]  Discussed with Dr Hartford Poli 1

## 2013-09-14 MED ORDER — MAGIC MOUTHWASH
5.0000 mL | Freq: Three times a day (TID) | ORAL | Status: DC
  Administered 2013-09-14 – 2013-09-15 (×3): 5 mL via ORAL
  Filled 2013-09-14 (×5): qty 5

## 2013-09-14 NOTE — Telephone Encounter (Signed)
I phoned Hospice to speak w/ Vanessa [name] about Vanessa [name]; left a message. Will call back on Monday during their regular operating hours during the week.

## 2013-09-14 NOTE — Telephone Encounter (Signed)
Hospice called and wanted to ask if you could be this pt's attending physician for Hosp[facility]. Please call Vickie from Hospice back at [phone]. Thanks

## 2013-09-14 NOTE — Progress Notes (Signed)
Telephone order from [facility] for magic mouthwash, order entered Neta Mends RN 09-14-2013 16:33pm

## 2013-09-14 NOTE — Progress Notes (Signed)
Notified by Deneen Harts, patient and family request services of [facility]) after discharge.   Spoke with patient at bedside and son Delsa Bern via telephone to initiate education related to hospice services, philosophy and team approach to care; they voiced good understanding of information provided.  Son Delsa Bern voiced they have decided on comfort care only at this time and have arranged 24/7 caregivers from [place] to start once patient arrives home Per notes and discussion plan is to d/c home tomorrow, Saturday 09/15/13 by Corey Harold  *Discussed with son Hospice admission RN is available to be at the home, Saturday between 12-12:30 pm and he is hopeful staff can arrange for pt to discharge to be at the home by this time * Per PMT 09/14/13 symptom management recommendations please send RX home with pt for:  Ativan 0.5 mg q 6 hrs prn anxiety and Liquid Conc. Morphine 10 mg /0.5 ml, give 5 mg q 2 hours PRN pain, SOB, * Dispense Quantity = 30 ml* *Please send completed GOLD DNR Form home with pt;  *Pt/family requests Foley to be left in place for comfort at discharge  DME needs discussed pt currently on Oxygen at [facility] - this was discussed with attending Dr Hartford Poli who will order this to continue at discharge for comfort; has electric Tempurpedic bed in the home; she declines a hospital bed at this time- son has asked about guard rails for the bed - [facility] equipment manager Jewel Ysidro Evert to contact [facility] with requests  *[facility] representative to contact son Dannah Ryles at [phone] to arrange delivery time this evening  Initial paperwork faxed to [place]. * Completed d/c summary will need to be faxed to [place] @ [phone] when final Please notify [facility] when patient is ready to leave unit at d/c call [phone]   [facility] information and contact numbers also given to son Ruffin during visit.   Above information shared with Dr Hartford Poli and staff RN  Gaylyn Rong Please call with any questions or concerns   Danton Sewer, RN 09/14/2013, 1:45 PM [facility] Liaison [phone]

## 2013-09-14 NOTE — Care Management Note (Unsigned)
    Page 1 of 1   09/14/2013     1:10:39 PM CARE MANAGEMENT NOTE 09/14/2013  Patient:  Vanessa [name], Vanessa [name]   Account Number:  [id number]  Date Initiated:  09/13/2013  Documentation initiated by:  DAVIS,RHONDA  Subjective/Objective Assessment:   80 year old female with active gi bleed and anemia requiring bld products     Action/Plan:   Home with home hospice services.   Anticipated DC Date:  09/17/2013   Anticipated DC Plan:  [place] referral  Clinical Social Worker      DC Planning Services  CM consult      Choice offered to / List presented to:             Status of service:  In process, will continue to follow Medicare Important Message given?  YES (If response is "NO", the following Medicare IM given date fields will be blank) Date Medicare IM given:  09/14/2013 Medicare IM given by:  [facility] Date Additional Medicare IM given:   Additional Medicare IM given by:    Discharge Disposition:    Per UR Regulation:  Reviewed for med. necessity/level of care/duration of stay  If discussed at [place] of Stay Meetings, dates discussed:    Comments:  09/14/13 Allene Dillon RN BSN [phone] CM received referral for home hospice choice. I met with pt briefly at bedside and she asked me to speak with her sons. Called and spoke with Tressie Stalker, he informed me they want to use [facility] for the services. Referral made to [facility] with [facility].

## 2013-09-14 NOTE — Progress Notes (Signed)
Progress Note from the Palliative Medicine Team at [facility]:  -pateint is lethargic and sleepy  -spoke  with son Kyria Bumgardner and Shavonn Convey at bedside, continued conversation regarding [place] and options  -plan is for  full comfort path and to dc home with hospice services if eligible, family will provide 24/7 caregivers, they have already set this up  (poor po intake, no antibiotic use, nonambulatory, h/o GI bleed and continued dark stools, weakness/fatigue, recent falls and respiratory failure I believe she has a prognosis of less than six months) Will write for choice     Objective: Allergies  Allergen Reactions  . Tape Other (See Comments)    Tears skin. Band-Aids   Scheduled Meds: . feeding supplement (ENSURE COMPLETE)  237 mL Oral BID BM  . pantoprazole sodium  40 mg Per Tube BID  . sodium chloride  3 mL Intravenous Q12H  . sucralfate  1 g Oral TID WC & HS   Continuous Infusions:   PRN Meds:.acetaminophen (TYLENOL) oral liquid 160 mg/5 mL, LORazepam, morphine injection, morphine CONCENTRATE, ondansetron (ZOFRAN) IV  BP 135/79  Pulse 106  Temp(Src) 98.1 F (36.7 C) (Oral)  Resp 16  Ht 5\' 2"  (1.575 m)  Wt 53.1 kg (117 lb 1 oz)  BMI 21.41 kg/m2  SpO2 96%   PPS:30 % at best  Pain Score:denies    Intake/Output Summary (Last 24 hours) at 09/14/13 1001 Last data filed at 09/14/13 0600  Gross per 24 hour  Intake     60 ml  Output   1402 ml  Net  -1342 ml       Physical Exam:  General: chronically ill appearing, more lethargic today HEENT: Dry buccal membranes, no exudate  Chest: Decreased in bases, CTA  CVS: tachycardic Abdomen:soft NT +BS  Ext: without edema  Neuro: alert and oriented X3, much weaker today  Labs: CBC    Component Value Date/Time   WBC 11.7* 09/13/2013 0600   WBC 8.1 07/27/2011 1149   RBC 2.77* 09/13/2013 0600   RBC 4.47 07/27/2011 1149   HGB 8.6* 09/13/2013 0600   HGB 14.0 07/27/2011 1149   HCT 25.5* 09/13/2013 0600   HCT 45.1 07/27/2011 1149   PLT 293 09/13/2013 0600   MCV 92.1 09/13/2013 0600   MCV 101* 07/27/2011 1149   MCH 31.0 09/13/2013 0600   MCH 31.3* 07/27/2011 1149   MCHC 33.7 09/13/2013 0600   MCHC 31.0* 07/27/2011 1149   RDW 16.2* 09/13/2013 0600   LYMPHSABS 1.1 09/13/2013 0600   MONOABS 1.0 09/13/2013 0600   EOSABS 0.4 09/13/2013 0600   BASOSABS 0.0 09/13/2013 0600    BMET    Component Value Date/Time   NA 142 09/13/2013 0600   K 3.6* 09/13/2013 0600   CL 105 09/13/2013 0600   CO2 28 09/13/2013 0600   GLUCOSE 109* 09/13/2013 0600   BUN 25* 09/13/2013 0600   CREATININE 0.78 09/13/2013 0600   CREATININE 0.95 06/23/2011 1056   CALCIUM 8.7 09/13/2013 0600   GFRNONAA 71* 09/13/2013 0600   GFRAA 82* 09/13/2013 0600    CMP     Component Value Date/Time   NA 142 09/13/2013 0600   K 3.6* 09/13/2013 0600   CL 105 09/13/2013 0600   CO2 28 09/13/2013 0600   GLUCOSE 109* 09/13/2013 0600   BUN 25* 09/13/2013 0600   CREATININE 0.78 09/13/2013 0600   CREATININE 0.95 06/23/2011 1056   CALCIUM 8.7 09/13/2013 0600   PROT 6.0 09/10/2013 0449  ALBUMIN 2.4* 09/10/2013 0449   AST 19 09/10/2013 0449   ALT 15 09/10/2013 0449   ALKPHOS 105 09/10/2013 0449   BILITOT 0.3 09/10/2013 0449   GFRNONAA 71* 09/13/2013 0600   GFRAA 82* 09/13/2013 0600     Assessment and Plan: 1. Code Status:DNR/DNI-  Focus of care is full comfort--MOST form completed -no further diagnostics, IV fluids, labs -minimize medications, dc central line, leave foley in place for comfort -diet as tolerated  2. Symptom Control:         Anxiety: Ativan 0.5 mg every 6 hrs prn         Pain/Dyspnea: Roxanol 5 mg evry 2 hrs prn   3. Psycho/Social: Emotional support offered to patient and her son  4. Spiritual:  strong community church support  Disposition: Home with hospice in the am   Patient Documents Completed or Given: Document Given Completed  Advanced Directives Pkt    MOST  yes  DNR    Gone from My Sight    Hard Choices yes     Time In Time Out Total Time Spent  with Patient Total Overall Time  1035 1100 25  min 25 min    Greater than 50%  of this time was spent counseling and coordinating care related to the above assessment and plan.  Wadie Lessen NP  Palliative Medicine Team Team Phone # [phone] Pager [phone]  Discussed with Dr Hartford Poli 1

## 2013-09-14 NOTE — Progress Notes (Signed)
[facility] PROGRESS NOTE  Vanessa [name] CZY:[id number] DOB: Sep 21, 1920 DOA: 09/10/2013 PCP: Ellsworth Lennox, MD  HPI/Subjective: Seen with her 2 sons at bedside, denies significant complaints. Wants to go home not to a facility. had dark bowel movement, DC in a.m. to home with hospice  Assessment/Plan  Upper GIB likely due to gastritis or peptic ulcer from naproxen, hgb now increased to 9.6 after blood transfusion - On Protonix drip, will switch to oral twice a day. - NPO - Check hemoglobin in a.m. It appears that she is not actively bleeding. - Appreciate GI assistance, patient does not want EGD to be done. - Vitamin K given in ER, continue to hold coumadin, INR is 1.27 today.  Acute blood loss anemia, improved with blood transfusions - Transfuse for symptomatic anemia or hgb < 7   Mild hypotension, now resolved after blood transfusions - Hold BP medications   Acute hypoxic respiratory failure likely secondary to bilateral pleural effusions noted on CT which developed post-rib fractures during last admission. Mild hypoalbuminemia.  Placed on bipap overnight and received lasix. - CXR with left greater than right pleural effusions and airspace consolidations, possible pneumonia - Consider diagnostic thoracentesis once INR < 1.6  - ECHO:  EF 60-65% with normal wall motion and difficult to discern diastolic dysfunction  Ovarian cyst, will need pelvic US eventually. Hold due to acute illness   Rib fractures, still in pain - IV morphine as needed   Recurrent falls  - PT/OT if she clinically improves  A-fib, Tele:  A-fib around 100bpm -  Consider digoxin temporarily for rate control  - Hold warfarin  - received 1 unit FFP - Continue vitamin K  - Recommend against resuming warfarin anytime soon   Peripheral neuropathy, stable, continue tylenol prn   Anxiety, stable continue prn xanax   Leukocytosis likely reactive from acute anemia  - CXR:  Possible pneumonia on  repeat CXR (although more likely due to pulmonary edema) - UA:  Neg  Thrombocytosis, likely reactive from GIB, resolved.  End-of-life, goals of care -Patient seen by palliative and hospice team, discussed with the family. -Patient will probably go home with palliative/hospice care, palliative team following. -DNR/DNI for now.  Diet:  Will advance to soft diet. Access:  PIV IVF:  off Proph:  SCD  Code Status: DNR/DNI Family Communication:  Disposition Plan: Transfer to [facility].   Consultants:  GI, Dr. Penelope Coop  PCCM, Dr. Chase Caller  Procedures:  CVC left IJ on 8/31  Bipap trial 9/1  Antibiotics:  None     Objective: Filed Vitals:   09/13/13 1200 09/13/13 1347 09/13/13 2126 09/14/13 0600  BP: 147/72 150/54 124/106 135/79  Pulse:   107 106  Temp:  98.7 F (37.1 C) 98.5 F (36.9 C) 98.1 F (36.7 C)  TempSrc:  Oral Oral Oral  Resp: 20  16 16   Height:      Weight:      SpO2: 98% 100% 95% 96%    Intake/Output Summary (Last 24 hours) at 09/14/13 1451 Last data filed at 09/14/13 0900  Gross per 24 hour  Intake      0 ml  Output    903 ml  Net   -903 ml   Filed Weights   09/10/13 1053 09/12/13 0400 09/13/13 0400  Weight: 50.803 kg (112 lb) 52.6 kg (115 lb 15.4 oz) 53.1 kg (117 lb 1 oz)    Exam:   General:  Thin WF, clearly uncomfortable with face mask in place  HEENT:  NCAT, cracked lips  Cardiovascular:  IRRR, nl S1, S2 no mrg, 2+ pulses, warm extremities  Respiratory:  bipap in place.  Diminished at left base, no obvious rales heard, no increased WOB  Abdomen:   Hyperactive BS, soft, mildly distended and diffuse TTP without rebound or guarding.  Has nausea with abdominal palpation.  MSK:   Normal tone and bulk, no LEE.  Pain with movement of the left shoulder due to posterior rib pain.  Neuro:  Grossly moves all extremities  Data Reviewed: Basic Metabolic Panel:  Recent Labs Lab 09/10/13 0449 09/10/13 1921 09/11/13 0500 09/12/13 0530  09/13/13 0600  NA 139 141 142 144 142  K 4.5 4.4 4.0 3.7 3.6*  CL 98 105 106 105 105  CO2 30 25 27 30 28   GLUCOSE 163* 118* 98 90 109*  BUN 28* 37* 30* 33* 25*  CREATININE 0.88 0.76 0.75 0.90 0.78  CALCIUM 9.1 8.3* 8.5 8.6 8.7  MG  --  1.8 1.9 2.0 1.9  PHOS  --   --  2.6 3.2 3.1   Liver Function Tests:  Recent Labs Lab 09/10/13 0449  AST 19  ALT 15  ALKPHOS 105  BILITOT 0.3  PROT 6.0  ALBUMIN 2.4*   No results found for this basename: LIPASE, AMYLASE,  in the last 168 hours No results found for this basename: AMMONIA,  in the last 168 hours CBC:  Recent Labs Lab 09/10/13 0449  09/10/13 1921 09/11/13 0500 09/11/13 0911 09/11/13 1600 09/12/13 0530 09/12/13 1600 09/13/13 0600  WBC 17.9*  --  18.7* 13.0*  --   --  13.9*  --  11.7*  NEUTROABS 15.3*  --  14.8* 9.5*  --   --  10.4*  --  9.1*  HGB 10.1*  < > 7.1* 9.6* 10.6* 9.8* 9.1* 9.0* 8.6*  HCT 30.3*  < > 21.1* 28.3* 31.4* 28.0* 26.6* 26.6* 25.5*  MCV 95.0  --  95.5 91.9  --   --  90.2  --  92.1  PLT 444*  --  379 258  --   --  309  --  293  < > = values in this interval not displayed. Cardiac Enzymes:  Recent Labs Lab 09/11/13 0500  TROPONINI <0.30   BNP (last 3 results) No results found for this basename: PROBNP,  in the last 8760 hours CBG: No results found for this basename: GLUCAP,  in the last 168 hours  Recent Results (from the past 240 hour(s))  URINE CULTURE     Status: None   Collection Time    09/10/13 11:09 AM      Result Value Ref Range Status   Specimen Description URINE, CLEAN CATCH   Final   Special Requests NONE   Final   Culture  Setup Time     Final   Value: 09/10/2013 16:00     Performed at [facility]     Final   Value: 50,000 COLONIES/ML     Performed at [facility]   Culture     Final   Value: Multiple bacterial morphotypes present, none predominant. Suggest appropriate recollection if clinically indicated.     Performed at [facility]   Report Status 09/11/2013 FINAL   Final  MRSA PCR SCREENING     Status: None   Collection Time    09/10/13  6:43 PM      Result Value Ref Range Status   MRSA by PCR  NEGATIVE  NEGATIVE Final   Comment:            The GeneXpert MRSA Assay (FDA     approved for NASAL specimens     only), is one component of a     comprehensive MRSA colonization     surveillance program. It is not     intended to diagnose MRSA     infection nor to guide or     monitor treatment for     MRSA infections.     Studies: No results found.  Scheduled Meds: . feeding supplement (ENSURE COMPLETE)  237 mL Oral BID BM  . pantoprazole sodium  40 mg Per Tube BID  . sodium chloride  3 mL Intravenous Q12H  . sucralfate  1 g Oral TID WC & HS   Continuous Infusions:    Principal Problem:   GI bleed Active Problems:   Atrial fibrillation   Multiple rib fractures   Acute blood loss anemia   Pleural effusion   Acute respiratory failure with hypoxia   Other and unspecified ovarian cyst   Hypotension (arterial)   Pleural effusion, left   Palliative care encounter   Weakness generalized   Anxiety state, unspecified    Time spent: 35 min    [facility] Pager [phone]. If 7PM-7AM, please contact night-coverage at [email or web address], password [facility] 09/14/2013, 2:51 PM  LOS: 4 days

## 2013-09-14 NOTE — Progress Notes (Signed)
Paged MD Elmahi that patient complaining of painful red tongue and would like something ordered, awaiting callback Neta Mends RN 09-14-2013 15:59pm

## 2013-09-14 NOTE — Progress Notes (Signed)
[facility] Progress Note  Subjective: No major complaints. Patient transferred to floor. Nurse reports stools are dark.  Objective: Vital signs in last 24 hours: Temp:  [98.1 F (36.7 C)-98.7 F (37.1 C)] 98.1 F (36.7 C) (09/04 0600) Pulse Rate:  [106-107] 106 (09/04 0600) Resp:  [16-30] 16 (09/04 0600) BP: (124-150)/(54-106) 135/79 mmHg (09/04 0600) SpO2:  [95 %-100 %] 96 % (09/04 0600) Weight change:    PE:  She is in no distress  Abdomen soft nontender  Lab Results: No results found for this or any previous visit (from the past 24 hour(s)).  Studies/Results: No results found.    Assessment: GI bleed due to gastric irritation from NSAIDs and effects of Coumadin  Plan: Continue medical treatment. As per prior notes avoid endoscopy unless acute intervention needed.    Caytlin Better F 09/14/2013, 9:21 AM

## 2013-09-15 MED ORDER — MORPHINE SULFATE (CONCENTRATE) 10 MG /0.5 ML PO SOLN: 5.0000 mg | ORAL | Status: DC | PRN

## 2013-09-15 MED ORDER — MAGIC MOUTHWASH: 5.0000 mL | Freq: Three times a day (TID) | ORAL | Status: DC

## 2013-09-15 MED ORDER — PANTOPRAZOLE SODIUM 40 MG PO PACK: 40.0000 mg | PACK | Freq: Two times a day (BID) | ORAL | Status: DC

## 2013-09-15 NOTE — Clinical Social Work Note (Signed)
Family has requested ambulance transport pick patient up between 11AM and 11:30AM. CSW has scheduled transport for this time. DC packet is on patient's chart with signed DNR. Family in room aware of DC. CSW signing off at this time.  Liz Beach MSW, [place], [place], [phone]

## 2013-09-15 NOTE — Progress Notes (Signed)
Assessment unchanged. Family and pt verbalized understanding of dc instructions through teach back. Foley catheter left in place per palliative care recommendation based on family request for discharge home. Order obtained from Dr. Hartford Poli to leave foley in for dc home. Hospice to follow pt upon arrival home. Perineal and catheter care done this am. Dressing removed from left neck at IJ IV site dc'd 3 days ago. Skin red yet intact. Cool cloth applied as well as lotion. Discharged via stretcher accompanied by PTAR attendants x 2 to meet awaiting ambulance to transport back to home. Sons x 2 to meet ambulance and pt at the home.

## 2013-09-15 NOTE — Discharge Summary (Signed)
Physician Discharge Summary  Vanessa [name]:[id number] DOB: 16-May-1920 DOA: 09/10/2013  PCP: Ellsworth Lennox, MD  Admit date: 09/10/2013 Discharge date: 09/15/2013  Time spent: 40 minutes  Recommendations for Outpatient Follow-up:  1. Followup with hospice service at home.  Discharge Diagnoses:  Principal Problem:   GI bleed Active Problems:   Atrial fibrillation   Multiple rib fractures   Acute blood loss anemia   Pleural effusion   Acute respiratory failure with hypoxia   Other and unspecified ovarian cyst   Hypotension (arterial)   Pleural effusion, left   Palliative care encounter   Weakness generalized   Anxiety state, unspecified   Discharge Condition: Fair  Diet recommendation: Regular soft diet  Filed Weights   09/10/13 1053 09/12/13 0400 09/13/13 0400  Weight: 50.803 kg (112 lb) 52.6 kg (115 lb 15.4 oz) 53.1 kg (117 lb 1 oz)    History of present illness:  The patient is a 80 y.o. year-old female with history of a-fib on chronic anticoagulation, carotid artery occlusion, HLD, and recurrent falls who presents with hematemesis and melena. The patient was admitted from 8/18 to 8/24 after a fall which resulted in multiple rib fractures. Her coumadin was stopped and reversed at that time but resumed at discharge. She continued to have rib pain and was receiving tylenol and naproxen 500mg  BID for her pain. She has had abdominal bloating x 1 week. Overnight last night, she developed dark tarry stools and coffee ground emesis, multiple episodes. She denies abdominal pain, fevers, chills. She did not eat well yesterday. Denies dysuria. Has new wheeze/SOB today.  In the ER, mild hypotension to 94/61, HR 125, R 30, 89% on RA. WBC 17.9, hgb 10.1 (down from 12.2 on 8/26), plt 444, BUN 28, INR 30.2. CT abd/pelvis demonstrated no acute finding, bilateral moderate to large pleural effusions, posterior left rib fractures, and a benign appearing right ovarian cyst.    Hospital  Course:   Upper GIB -Likely due to gastritis or peptic ulcer from naproxen.  -Started initially on Protonix drip, was n.p.o. -GI consulted, patient and family declined EGD to be done. -Bleeding improved after her INR normalized with vitamin K and twice a day Protonix -Discharge on Protonix twice a day. Warfarin discontinued at the time of discharge.  Acute blood loss anemia -Baseline of 10, presents to the hospital with hemoglobin of 7.1 -Improved after blood transfusion.  Mild hypotension -Likely secondary to the bleeding, hypotension resolved after bleeding stopped and transfusion.  Acute hypoxic respiratory failure -Likely secondary to bilateral pleural effusions noted on CT which developed post-rib fractures during last admission. Mild hypoalbuminemia. Placed on bipap overnight and received lasix.  - CXR with left greater than right pleural effusions and airspace consolidations, possible pneumonia  - Consider diagnostic thoracentesis once INR < 1.6  - ECHO: EF 60-65% with normal wall motion and difficult to discern diastolic dysfunction.   Ovarian cyst -Will need pelvic US eventually. Hold due to acute illness.   Rib fractures, still in pain  - IV morphine as needed   Recurrent falls  - PT/OT if she clinically improves   A-fib - Hold warfarin  - received 1 unit FFP  - Coumadin discontinued - Recommend against resuming warfarin anytime soon   Peripheral neuropathy, stable, continue tylenol prn   Anxiety, stable continue prn xanax   Leukocytosis likely reactive from acute anemia  - CXR: Possible pneumonia on repeat CXR (although more likely due to pulmonary edema)  - UA: Neg   Thrombocytosis, likely  reactive from GIB, resolved.   End-of-life, goals of care  -Patient seen by palliative and hospice medicine team, after discussion with the patient and family patient will be going home with home hospice.  -DNR/DNI, the goal of health care is  comfort.   Procedures:  None  Consultations:  Pulmonary and critical care medicine.  Gastroenterology.  Palliative and hospice medicine  Discharge Exam: Filed Vitals:   09/15/13 0600  BP: 150/73  Pulse: 109  Temp: 98.3 F (36.8 C)  Resp: 16   General: Alert and awake, oriented x3, not in any acute distress. HEENT: anicteric sclera, pupils reactive to light and accommodation, EOMI CVS: S1-S2 clear, no murmur rubs or gallops Chest: clear to auscultation bilaterally, no wheezing, rales or rhonchi Abdomen: soft nontender, nondistended, normal bowel sounds, no organomegaly Extremities: no cyanosis, clubbing or edema noted bilaterally Neuro: Cranial nerves II-XII intact, no focal neurological deficits  Discharge Instructions You were cared for by a hospitalist during your hospital stay. If you have any questions about your discharge medications or the care you received while you were in the hospital after you are discharged, you can call the unit and asked to speak with the hospitalist on call if the hospitalist that took care of you is not available. Once you are discharged, your primary care physician will handle any further medical issues. Please note that NO REFILLS for any discharge medications will be authorized once you are discharged, as it is imperative that you return to your primary care physician (or establish a relationship with a primary care physician if you do not have one) for your aftercare needs so that they can reassess your need for medications and monitor your lab values.  Discharge Instructions   Diet - low sodium heart healthy    Complete by:  As directed      Increase activity slowly    Complete by:  As directed           Current Discharge Medication List    START taking these medications   Details  Alum & Mag Hydroxide-Simeth (MAGIC MOUTHWASH) SOLN Take 5 mLs by mouth 3 (three) times daily. Qty: 15 mL, Refills: 30    Morphine Sulfate (MORPHINE  CONCENTRATE) 10 mg / 0.5 ml concentrated solution Take 0.25 mLs (5 mg total) by mouth every 2 (two) hours as needed for moderate pain or shortness of breath. Qty: 30 mL, Refills: 0    pantoprazole sodium (PROTONIX) 40 mg/20 mL PACK Place 20 mLs (40 mg total) into feeding tube 2 (two) times daily. Qty: 30 each, Refills: 0      CONTINUE these medications which have NOT CHANGED   Details  acetaminophen (TYLENOL) 160 MG/5ML solution Take 31.3 mLs (1,000 mg total) by mouth every 6 (six) hours as needed (Pain). Qty: 120 mL, Refills: 0    ALPRAZolam (XANAX) 0.25 MG tablet Take 0.25 mg by mouth at bedtime as needed for anxiety.    atenolol (TENORMIN) 25 MG tablet Take 25 mg by mouth 2 (two) times daily.    Calcium-Vitamin D-Vitamin K (VIACTIV PO) Take 1 tablet by mouth daily.     Multiple Vitamin (MULTIVITAMIN) tablet Take 1 tablet by mouth daily.      simvastatin (ZOCOR) 40 MG tablet Take 20 mg by mouth daily.      STOP taking these medications     naproxen (NAPROSYN) 500 MG tablet      warfarin (COUMADIN) 2 MG tablet        Allergies  Allergen Reactions  . Tape Other (See Comments)    Tears skin. Band-Aids   Follow-up Information   Follow up with [facility], MD On 09/15/2013.   Specialty:  Family Medicine   Contact information:   [place] [address] [phone]        The results of significant diagnostics from this hospitalization (including imaging, microbiology, ancillary and laboratory) are listed below for reference.    Significant Diagnostic Studies: Dg Ribs Unilateral W/chest Left  08/28/2013   CLINICAL DATA:  [place] today at after losing her balance, LEFT flank pain  EXAM: LEFT RIBS AND CHEST - 3+ VIEW  COMPARISON:  Chest radiographs 06/09/2012  FINDINGS: Enlargement of cardiac silhouette.  Mild tortuosity of thoracic aorta.  Mediastinal contours and pulmonary vascularity otherwise normal.  Chronic bronchitic changes with minimal LEFT  basilar atelectasis.  No acute infiltrate, pleural effusion or pneumothorax.  Diffuse osseous demineralization.  BB placed at site of symptoms lower lateral LEFT chest.  Multiple LEFT rib fractures identified at the LEFT fifth, sixth, seventh, eighth and ninth ribs, lateral to posterolateral in location.  Degenerative disc disease changes and scoliosis of the thoracolumbar spine with evidence of prior lumbar spinal fixation.  Superimposed costal cartilaginous calcifications identified.  IMPRESSION: Chronic bronchitic changes and minimal LEFT basilar atelectasis.  Displaced fractures of the LEFT fifth through ninth ribs as above.   Electronically Signed   By: Lavonia Dana M.D.   On: 08/28/2013 16:54   Ct Abdomen Pelvis W Contrast  09/10/2013   CLINICAL DATA:  Elevated white blood cell count. Abdominal distention. Tarry stools  EXAM: CT ABDOMEN AND PELVIS WITH CONTRAST  TECHNIQUE: Multidetector CT imaging of the abdomen and pelvis was performed using the standard protocol following bolus administration of intravenous contrast.  CONTRAST:  113mL OMNIPAQUE IOHEXOL 300 MG/ML  SOLN  COMPARISON:  Chest radiograph 09/02/2013  FINDINGS: Left lateral rib fractures again noted. There bilateral pleural effusions greater on the left. There is bibasilar atelectasis associated these moderate effusions. No pericardial fluid.  There is no focal hepatic lesion. Gallbladder is not identified. Pancreas, spleen, adrenal glands, kidneys are normal. There is significant streak artifact through the abdomen from the posterior lumbar fixation.  Stomach, small bowel, and cecum are normal. Appendix is not identified. The sigmoid colon has multiple diverticula. No acute diverticulitis.  Abdominal aorta is normal caliber with intimal calcification. No retroperitoneal periportal lymphadenopathy.  No free fluid in the pelvis. There is a 5.7 cm cyst associated with the right ovary. Post hysterectomy anatomy. Bladder is normal. No aggressive  osseous lesion. Posterior lumbar fusion again noted.  IMPRESSION: 1. No acute abdominal or pelvic findings. No explanation for elevated white blood cell count. 2. Bilateral moderate to large pleural effusions with associated atelectasis. 3. Posterior left rib fractures. 4. Large benign appearing cyst associated with the right ovary. In late postmenopausal female, recommend abdominal ultrasound further evaluation. This recommendation follows ACR consensus guidelines: White Paper of the ACR Incidental Findings Committee II on Adnexal Findings. J Am Coll Radiol [phone].   Electronically Signed   By: Suzy Bouchard M.D.   On: 09/10/2013 08:36   Dg Chest Port 1 View  09/11/2013   CLINICAL DATA:  Respiratory distress  EXAM: PORTABLE CHEST - 1 VIEW  COMPARISON:  09/10/2013  FINDINGS: Moderate to large left pleural effusion and small to moderate right pleural effusion with associated airspace consolidations, similar to prior. Interstitial and hazy airspace opacities, similar to prior. Mild upper lobe bronchiectasis. No pneumothorax. Left  IJ catheter tip projects over the mid SVC. No interval osseous change. Multilevel degenerative changes and curvature of the spine. Multiple left lateral rib fractures.  IMPRESSION: Similar appearance to prior with left greater than right pleural effusions and associated airspace consolidations ; atelectasis versus pneumonia.  Interstitial and hazy airspace opacities may reflect edema superimposed on chronic changes.   Electronically Signed   By: Carlos Levering M.D.   On: 09/11/2013 06:38   Dg Chest Port 1 View  09/10/2013   CLINICAL DATA:  Central line placement.  EXAM: PORTABLE CHEST - 1 VIEW  COMPARISON:  09/10/2013 at 10:44 a.m.  FINDINGS: Left IJ central line tip:  SVC.  Large left pleural effusion, bilateral airspace opacities, and obscured bilateral hemidiaphragms again noted. Left cardiac margin obscured.  IMPRESSION: 1. Left IJ line tip: Lower SVC. Otherwise stable  appearance of the chest.   Electronically Signed   By: Sherryl Barters M.D.   On: 09/10/2013 19:57   Dg Chest Port 1 View  09/10/2013   CLINICAL DATA:  Recent left-sided rib fractures.  EXAM: PORTABLE CHEST - 1 VIEW  COMPARISON:  09/02/2013.  FINDINGS: Interval increase in left-sided pleural effusion noted with fluid now seen in the left apex. Ms. raises the question of a degree of loculation. Numerous left-sided rib fractures are evident. There is right base atelectasis with probable small right pleural effusion associated. Interstitial markings are diffusely coarsened with chronic features. The cardiopericardial silhouette is within normal limits for size. Bones are diffusely demineralized. Telemetry leads overlie the chest.  IMPRESSION: Interval increase in left-sided pleural effusion which is now moderate to large in size and may be loculated. No evidence for associated pneumothorax.  Persistent right base atelectasis with small right pleural effusion.   Electronically Signed   By: Misty Stanley M.D.   On: 09/10/2013 11:02   Dg Chest Port 1 View  09/02/2013   CLINICAL DATA:  Left fifth rib fracture with pleural effusion and CHF  EXAM: PORTABLE CHEST - 1 VIEW  COMPARISON:  Portable chest x-ray of August 31, 2013  FINDINGS: The lungs are adequately inflated. There is a moderate-sized left pleural effusion and small right pleural effusion which have increased slightly in volume. There is no pneumothorax. There is apical pleural thickening bilaterally. There are fractures of the lateral aspects of the fourth, fifth the mediastinum is normal in width. The cardiac silhouette is not enlarged. The pulmonary vascularity is not clearly engorged. , sixth, and seventh ribs laterally on the left.  IMPRESSION: Multiple left lateral rib fractures are present with bilateral pleural effusions but no pneumothorax or pneumomediastinum. When compared to yesterday's study allowing for differences in positioning there has been  slight increase in the volume of the pleural effusions.   Electronically Signed   By: David  Martinique   On: 09/02/2013 08:51   Dg Chest Port 1 View  08/31/2013   CLINICAL DATA:  Pleural effusion.  Left rib fractures.  EXAM: PORTABLE CHEST - 1 VIEW  COMPARISON:  08/30/2013.  FINDINGS: Mediastinum hilar structures normal. Cardiomegaly with pulmonary vascular prominence, interstitial prominence and bilateral pleural effusions again noted. These findings consistent with congestive heart failure. There has been progression of pulmonary edema on today's exam. No pneumothorax. Multiple left lower rib fractures are again noted.  IMPRESSION: 1. Findings consistent with congestive heart failure with pulmonary edema and bilateral pleural effusions. Pulmonary edema has increased slightly on today's exam .  2.  Stable left lower rib fractures.  No pneumothorax.  Electronically Signed   By: Marcello Moores  Register   On: 08/31/2013 07:54   Dg Chest Port 1 View  08/30/2013   CLINICAL DATA:  Rib fractures.  EXAM: PORTABLE CHEST - 1 VIEW  COMPARISON:  Single view of the chest 08/29/2013.  FINDINGS: Moderately large bilateral pleural effusions, worse on the left, have increased. There is associated compressive atelectasis. No pneumothorax identified. Heart size is enlarged. Left rib fractures are again seen.  IMPRESSION: Increased left greater than right pleural effusions and atelectasis.   Electronically Signed   By: Inge Rise M.D.   On: 08/30/2013 07:51   Dg Chest Port 1 View  08/29/2013   CLINICAL DATA:  follow rib fractures  EXAM: PORTABLE CHEST - 1 VIEW  COMPARISON:  08/28/2013; 06/09/2012  FINDINGS: Grossly unchanged enlarged cardiac silhouette and mediastinal contours with atherosclerotic plaque within the thoracic aorta. The pulmonary vasculature appears less distinct on present examination with cephalization of flow. Grossly unchanged small bilateral pleural effusions. Slight worsening of bibasilar heterogeneous  opacities, left greater than right. No pneumothorax. Minimally displaced fractures involving the posterior and lateral aspects of the left 5th through 9th ribs are re- demonstrated.  IMPRESSION: 1. Findings suggestive of worsening pulmonary edema and bibasilar opacities, left greater than right, atelectasis versus infiltrate. 2. Re- demonstrated minimally displaced fractures involving the posterior and lateral aspects of the left 5th through 9th ribs.   Electronically Signed   By: Sandi Mariscal M.D.   On: 08/29/2013 07:39    Microbiology: Recent Results (from the past 240 hour(s))  URINE CULTURE     Status: None   Collection Time    09/10/13 11:09 AM      Result Value Ref Range Status   Specimen Description URINE, CLEAN CATCH   Final   Special Requests NONE   Final   Culture  Setup Time     Final   Value: 09/10/2013 16:00     Performed at [facility] Count     Final   Value: 50,000 COLONIES/ML     Performed at [facility]   Culture     Final   Value: Multiple bacterial morphotypes present, none predominant. Suggest appropriate recollection if clinically indicated.     Performed at [facility]   Report Status 09/11/2013 FINAL   Final  MRSA PCR SCREENING     Status: None   Collection Time    09/10/13  6:43 PM      Result Value Ref Range Status   MRSA by PCR NEGATIVE  NEGATIVE Final   Comment:            The GeneXpert MRSA Assay (FDA     approved for NASAL specimens     only), is one component of a     comprehensive MRSA colonization     surveillance program. It is not     intended to diagnose MRSA     infection nor to guide or     monitor treatment for     MRSA infections.     Labs: Basic Metabolic Panel:  Recent Labs Lab 09/10/13 0449 09/10/13 1921 09/11/13 0500 09/12/13 0530 09/13/13 0600  NA 139 141 142 144 142  K 4.5 4.4 4.0 3.7 3.6*  CL 98 105 106 105 105  CO2 30 25 27 30 28   GLUCOSE 163* 118* 98 90 109*  BUN 28* 37* 30* 33*  25*  CREATININE 0.88 0.76 0.75 0.90 0.78  CALCIUM 9.1 8.3*  8.5 8.6 8.7  MG  --  1.8 1.9 2.0 1.9  PHOS  --   --  2.6 3.2 3.1   Liver Function Tests:  Recent Labs Lab 09/10/13 0449  AST 19  ALT 15  ALKPHOS 105  BILITOT 0.3  PROT 6.0  ALBUMIN 2.4*   No results found for this basename: LIPASE, AMYLASE,  in the last 168 hours No results found for this basename: AMMONIA,  in the last 168 hours CBC:  Recent Labs Lab 09/10/13 0449  09/10/13 1921 09/11/13 0500 09/11/13 0911 09/11/13 1600 09/12/13 0530 09/12/13 1600 09/13/13 0600  WBC 17.9*  --  18.7* 13.0*  --   --  13.9*  --  11.7*  NEUTROABS 15.3*  --  14.8* 9.5*  --   --  10.4*  --  9.1*  HGB 10.1*  < > 7.1* 9.6* 10.6* 9.8* 9.1* 9.0* 8.6*  HCT 30.3*  < > 21.1* 28.3* 31.4* 28.0* 26.6* 26.6* 25.5*  MCV 95.0  --  95.5 91.9  --   --  90.2  --  92.1  PLT 444*  --  379 258  --   --  309  --  293  < > = values in this interval not displayed. Cardiac Enzymes:  Recent Labs Lab 09/11/13 0500  TROPONINI <0.30   BNP: BNP (last 3 results) No results found for this basename: PROBNP,  in the last 8760 hours CBG: No results found for this basename: GLUCAP,  in the last 168 hours     Signed:  Donold Marotto A  [facility] 09/15/2013, 10:17 AM

## 2013-09-16 NOTE — Progress Notes (Signed)
09/16/2013 1450 Faxed dc summary to [facility]. Jonnie Finner RN CCM Case Mgmt phone [phone]

## 2013-09-18 NOTE — Telephone Encounter (Signed)
Completed authorization process to be listed as PCP of record for this pt. Hospice MDs will provide management of symptoms/ comfort care, etc

## 2013-09-26 DIAGNOSIS — S2249XA Multiple fractures of ribs, unspecified side, initial encounter for closed fracture: Secondary | ICD-10-CM | POA: Diagnosis not present

## 2013-09-26 DIAGNOSIS — R634 Abnormal weight loss: Secondary | ICD-10-CM | POA: Diagnosis not present

## 2013-09-26 DIAGNOSIS — R110 Nausea: Secondary | ICD-10-CM | POA: Diagnosis not present

## 2013-09-26 DIAGNOSIS — I4891 Unspecified atrial fibrillation: Secondary | ICD-10-CM | POA: Diagnosis not present

## 2013-10-09 DIAGNOSIS — I6789 Other cerebrovascular disease: Secondary | ICD-10-CM | POA: Diagnosis not present

## 2013-10-11 DIAGNOSIS — D500 Iron deficiency anemia secondary to blood loss (chronic): Secondary | ICD-10-CM | POA: Diagnosis not present

## 2013-10-11 DIAGNOSIS — I4891 Unspecified atrial fibrillation: Secondary | ICD-10-CM | POA: Diagnosis not present

## 2013-10-11 DIAGNOSIS — J9600 Acute respiratory failure, unspecified whether with hypoxia or hypercapnia: Secondary | ICD-10-CM | POA: Diagnosis not present

## 2013-10-11 DIAGNOSIS — J90 Pleural effusion, not elsewhere classified: Secondary | ICD-10-CM | POA: Diagnosis not present

## 2013-10-11 DIAGNOSIS — K922 Gastrointestinal hemorrhage, unspecified: Secondary | ICD-10-CM | POA: Diagnosis not present

## 2013-10-11 DIAGNOSIS — R1310 Dysphagia, unspecified: Secondary | ICD-10-CM | POA: Diagnosis not present

## 2013-10-11 DIAGNOSIS — E785 Hyperlipidemia, unspecified: Secondary | ICD-10-CM | POA: Diagnosis not present

## 2013-10-11 DIAGNOSIS — E46 Unspecified protein-calorie malnutrition: Secondary | ICD-10-CM | POA: Diagnosis not present

## 2013-10-11 DIAGNOSIS — I5030 Unspecified diastolic (congestive) heart failure: Secondary | ICD-10-CM | POA: Diagnosis not present

## 2013-10-11 DIAGNOSIS — I739 Peripheral vascular disease, unspecified: Secondary | ICD-10-CM | POA: Diagnosis not present

## 2013-10-13 ENCOUNTER — Other Ambulatory Visit: Payer: Self-pay | Admitting: Cardiology

## 2013-10-23 ENCOUNTER — Ambulatory Visit: Payer: Medicare Other | Admitting: Family Medicine

## 2013-10-31 DIAGNOSIS — R0902 Hypoxemia: Secondary | ICD-10-CM | POA: Diagnosis not present

## 2013-10-31 DIAGNOSIS — R2681 Unsteadiness on feet: Secondary | ICD-10-CM | POA: Diagnosis not present

## 2013-10-31 DIAGNOSIS — I480 Paroxysmal atrial fibrillation: Secondary | ICD-10-CM | POA: Diagnosis not present

## 2013-10-31 DIAGNOSIS — R634 Abnormal weight loss: Secondary | ICD-10-CM | POA: Diagnosis not present

## 2013-11-06 DIAGNOSIS — R2689 Other abnormalities of gait and mobility: Secondary | ICD-10-CM | POA: Diagnosis not present

## 2013-11-06 DIAGNOSIS — R296 Repeated falls: Secondary | ICD-10-CM | POA: Diagnosis not present

## 2013-11-06 DIAGNOSIS — I482 Chronic atrial fibrillation: Secondary | ICD-10-CM | POA: Diagnosis not present

## 2013-11-06 DIAGNOSIS — Z9981 Dependence on supplemental oxygen: Secondary | ICD-10-CM | POA: Diagnosis not present

## 2013-11-06 DIAGNOSIS — R634 Abnormal weight loss: Secondary | ICD-10-CM | POA: Diagnosis not present

## 2013-11-06 DIAGNOSIS — S2242XS Multiple fractures of ribs, left side, sequela: Secondary | ICD-10-CM | POA: Diagnosis not present

## 2013-11-07 DIAGNOSIS — R2689 Other abnormalities of gait and mobility: Secondary | ICD-10-CM | POA: Diagnosis not present

## 2013-11-07 DIAGNOSIS — R634 Abnormal weight loss: Secondary | ICD-10-CM | POA: Diagnosis not present

## 2013-11-07 DIAGNOSIS — S2242XS Multiple fractures of ribs, left side, sequela: Secondary | ICD-10-CM | POA: Diagnosis not present

## 2013-11-07 DIAGNOSIS — R296 Repeated falls: Secondary | ICD-10-CM | POA: Diagnosis not present

## 2013-11-07 DIAGNOSIS — I482 Chronic atrial fibrillation: Secondary | ICD-10-CM | POA: Diagnosis not present

## 2013-11-07 DIAGNOSIS — Z9981 Dependence on supplemental oxygen: Secondary | ICD-10-CM | POA: Diagnosis not present

## 2013-11-09 ENCOUNTER — Ambulatory Visit (INDEPENDENT_AMBULATORY_CARE_PROVIDER_SITE_OTHER): Payer: Medicare Other

## 2013-11-09 DIAGNOSIS — Z23 Encounter for immunization: Secondary | ICD-10-CM | POA: Diagnosis not present

## 2013-11-12 ENCOUNTER — Other Ambulatory Visit: Payer: Self-pay | Admitting: Cardiology

## 2013-11-13 DIAGNOSIS — Z9981 Dependence on supplemental oxygen: Secondary | ICD-10-CM | POA: Diagnosis not present

## 2013-11-13 DIAGNOSIS — S2242XS Multiple fractures of ribs, left side, sequela: Secondary | ICD-10-CM | POA: Diagnosis not present

## 2013-11-13 DIAGNOSIS — I482 Chronic atrial fibrillation: Secondary | ICD-10-CM | POA: Diagnosis not present

## 2013-11-13 DIAGNOSIS — R2689 Other abnormalities of gait and mobility: Secondary | ICD-10-CM | POA: Diagnosis not present

## 2013-11-13 DIAGNOSIS — R634 Abnormal weight loss: Secondary | ICD-10-CM | POA: Diagnosis not present

## 2013-11-13 DIAGNOSIS — R296 Repeated falls: Secondary | ICD-10-CM | POA: Diagnosis not present

## 2013-11-14 DIAGNOSIS — Z9981 Dependence on supplemental oxygen: Secondary | ICD-10-CM | POA: Diagnosis not present

## 2013-11-14 DIAGNOSIS — R2689 Other abnormalities of gait and mobility: Secondary | ICD-10-CM | POA: Diagnosis not present

## 2013-11-14 DIAGNOSIS — I482 Chronic atrial fibrillation: Secondary | ICD-10-CM | POA: Diagnosis not present

## 2013-11-14 DIAGNOSIS — S2242XS Multiple fractures of ribs, left side, sequela: Secondary | ICD-10-CM | POA: Diagnosis not present

## 2013-11-14 DIAGNOSIS — R296 Repeated falls: Secondary | ICD-10-CM | POA: Diagnosis not present

## 2013-11-14 DIAGNOSIS — R634 Abnormal weight loss: Secondary | ICD-10-CM | POA: Diagnosis not present

## 2013-11-16 ENCOUNTER — Encounter: Payer: Self-pay | Admitting: Family Medicine

## 2013-11-16 ENCOUNTER — Encounter (HOSPITAL_COMMUNITY): Payer: Self-pay | Admitting: Emergency Medicine

## 2013-11-16 ENCOUNTER — Ambulatory Visit (INDEPENDENT_AMBULATORY_CARE_PROVIDER_SITE_OTHER): Payer: Medicare Other | Admitting: Family Medicine

## 2013-11-16 ENCOUNTER — Ambulatory Visit (INDEPENDENT_AMBULATORY_CARE_PROVIDER_SITE_OTHER): Payer: Medicare Other

## 2013-11-16 ENCOUNTER — Emergency Department (HOSPITAL_COMMUNITY)
Admission: EM | Admit: 2013-11-16 | Discharge: 2013-11-16 | Disposition: A | Discharge status: Home / Self Care | Payer: Medicare Other | Attending: Emergency Medicine | Admitting: Emergency Medicine

## 2013-11-16 VITALS — BP 120/71 | HR 80 | Temp 98.1°F | Resp 16 | Ht 62.5 in | Wt 103.6 lb

## 2013-11-16 DIAGNOSIS — R0602 Shortness of breath: Secondary | ICD-10-CM | POA: Diagnosis not present

## 2013-11-16 DIAGNOSIS — Z7982 Long term (current) use of aspirin: Secondary | ICD-10-CM | POA: Insufficient documentation

## 2013-11-16 DIAGNOSIS — Z8679 Personal history of other diseases of the circulatory system: Secondary | ICD-10-CM | POA: Diagnosis not present

## 2013-11-16 DIAGNOSIS — Z9981 Dependence on supplemental oxygen: Secondary | ICD-10-CM | POA: Insufficient documentation

## 2013-11-16 DIAGNOSIS — Z79899 Other long term (current) drug therapy: Secondary | ICD-10-CM | POA: Insufficient documentation

## 2013-11-16 DIAGNOSIS — I509 Heart failure, unspecified: Secondary | ICD-10-CM | POA: Diagnosis not present

## 2013-11-16 DIAGNOSIS — R072 Precordial pain: Secondary | ICD-10-CM

## 2013-11-16 DIAGNOSIS — Z872 Personal history of diseases of the skin and subcutaneous tissue: Secondary | ICD-10-CM | POA: Diagnosis not present

## 2013-11-16 DIAGNOSIS — R197 Diarrhea, unspecified: Secondary | ICD-10-CM

## 2013-11-16 DIAGNOSIS — J90 Pleural effusion, not elsewhere classified: Secondary | ICD-10-CM | POA: Insufficient documentation

## 2013-11-16 DIAGNOSIS — Z8739 Personal history of other diseases of the musculoskeletal system and connective tissue: Secondary | ICD-10-CM | POA: Diagnosis not present

## 2013-11-16 DIAGNOSIS — I6529 Occlusion and stenosis of unspecified carotid artery: Secondary | ICD-10-CM | POA: Diagnosis not present

## 2013-11-16 DIAGNOSIS — E780 Pure hypercholesterolemia: Secondary | ICD-10-CM | POA: Insufficient documentation

## 2013-11-16 DIAGNOSIS — Z8669 Personal history of other diseases of the nervous system and sense organs: Secondary | ICD-10-CM | POA: Insufficient documentation

## 2013-11-16 LAB — I-STAT TROPONIN, ED: Troponin i, poc: 0 ng/mL (ref 0.00–0.08)

## 2013-11-16 LAB — CBC
HCT: 36.5 % (ref 36.0–46.0)
Hemoglobin: 11.4 g/dL — ABNORMAL LOW (ref 12.0–15.0)
MCH: 28.6 pg (ref 26.0–34.0)
MCHC: 31.2 g/dL (ref 30.0–36.0)
MCV: 91.5 fL (ref 78.0–100.0)
Platelets: 298 10*3/uL (ref 150–400)
RBC: 3.99 MIL/uL (ref 3.87–5.11)
RDW: 14.3 % (ref 11.5–15.5)
WBC: 9.7 10*3/uL (ref 4.0–10.5)

## 2013-11-16 LAB — BASIC METABOLIC PANEL
Anion gap: 14 (ref 5–15)
BUN: 13 mg/dL (ref 6–23)
CO2: 26 mEq/L (ref 19–32)
Calcium: 10 mg/dL (ref 8.4–10.5)
Chloride: 99 mEq/L (ref 96–112)
Creatinine, Ser: 0.86 mg/dL (ref 0.50–1.10)
GFR calc Af Amer: 66 mL/min — ABNORMAL LOW (ref >90)
GFR calc non Af Amer: 57 mL/min — ABNORMAL LOW (ref >90)
Glucose, Bld: 94 mg/dL (ref 70–99)
Potassium: 4.1 mEq/L (ref 3.7–5.3)
Sodium: 139 mEq/L (ref 137–147)

## 2013-11-16 LAB — PRO B NATRIURETIC PEPTIDE: Pro B Natriuretic peptide (BNP): 878.2 pg/mL — ABNORMAL HIGH (ref 0–450)

## 2013-11-16 NOTE — Progress Notes (Signed)
Chief Complaint:  Chief Complaint  Patient presents with  . Diarrhea    took a coloase last night  . chest congestion    started today felt like she has congestion building up  . Nasal Congestion    HPI: Vanessa [name] is a 80 y.o. female who is here for 4 day history rhinorrhea, has had loose stools for the last 2 days She had a flu vaccine 11/09/13 and has felt not herself since then. She is here today because she was worried about not feeling well over the weekend since she lives alone and has had some midepigastric CP, she denies cough or facial pain or ear pain.  She deneis any leg swelling, denies weight gain, she normally has SOB. She was previously on Lasix 40 and per patient has been off Lasix 40 mg , and is on Lasix 20 mg She denies CP but has had some "chest congestio"n. She has had chronic SOB so does not know if she is having acute onset of SOB, she does use oxygen at home prn  She is a DNI/DNR   Of pertinent interest she was at [facility] for recent hospitalization from 09/10/2013-09/2013  for GIB due to NSAID use after  rib fracture, she also had right >left pleural effusion vs PNA, Acute respiratory failure  CBC Latest Ref Rng 09/13/2013 09/12/2013 09/12/2013  WBC 4.0 - 10.5 K/uL 11.7(H) - 13.9(H)  Hemoglobin 12.0 - 15.0 g/dL 8.6(L) 9.0(L) 9.1(L)  Hematocrit 36.0 - 46.0 % 25.5(L) 26.6(L) 26.6(L)  Platelets 150 - 400 K/uL 293 - 309        Discharge Hospital Course from 8/31-09/15/13 The patient is a 80 y.o. year-old female with history of a-fib on chronic anticoagulation, carotid artery occlusion, HLD, and recurrent falls who presents with hematemesis and melena. The patient was admitted from 8/18 to 8/24 after a fall which resulted in multiple rib fractures. Her coumadin was stopped and reversed at that time but resumed at discharge. She continued to have rib pain and was receiving tylenol and naproxen 500mg  BID for her pain. She has had abdominal bloating x 1  week. Overnight last night, she developed dark tarry stools and coffee ground emesis, multiple episodes. She denies abdominal pain, fevers, chills. She did not eat well yesterday. Denies dysuria. Has new wheeze/SOB today.  In the ER, mild hypotension to 94/61, HR 125, R 30, 89% on RA. WBC 17.9, hgb 10.1 (down from 12.2 on 8/26), plt 444, BUN 28, INR 30.2. CT abd/pelvis demonstrated no acute finding, bilateral moderate to large pleural effusions, posterior left rib fractures, and a benign appearing right ovarian cyst.    Hospital Course:   Upper GIB -Likely due to gastritis or peptic ulcer from naproxen.  -Started initially on Protonix drip, was n.p.o. -GI consulted, patient and family declined EGD to be done. -Bleeding improved after her INR normalized with vitamin K and twice a day Protonix -Discharge on Protonix twice a day. Warfarin discontinued at the time of discharge.  Acute blood loss anemia -Baseline of 10, presents to the hospital with hemoglobin of 7.1 -Improved after blood transfusion.  Mild hypotension -Likely secondary to the bleeding, hypotension resolved after bleeding stopped and transfusion.  Acute hypoxic respiratory failure -Likely secondary to bilateral pleural effusions noted on CT which developed post-rib fractures during last admission. Mild hypoalbuminemia. Placed on bipap overnight and received lasix.  - CXR with left greater than right pleural effusions and airspace consolidations, possible pneumonia  -  Consider diagnostic thoracentesis once INR < 1.6  - ECHO: EF 60-65% with normal wall motion and difficult to discern diastolic dysfunction.  Ovarian cyst -Will need pelvic US eventually. Hold due to acute illness.  Rib fractures, still in pain  - IV morphine as needed   Recurrent falls  - PT/OT if she clinically improves   A-fib - Hold warfarin  - received 1 unit FFP  - Coumadin discontinued - Recommend against resuming warfarin anytime soon    Peripheral neuropathy, stable, continue tylenol prn   Anxiety, stable continue prn xanax   Leukocytosis likely reactive from acute anemia  - CXR: Possible pneumonia on repeat CXR (although more likely due to pulmonary edema)  - UA: Neg   Thrombocytosis, likely reactive from GIB, resolved.   End-of-life, goals of care  -Patient seen by palliative and hospice medicine team, after discussion with the patient and family patient will be going home with home hospice.  -DNR/DNI, the goal of health care is comfort.   Procedures:  None  Consultations:  Pulmonary and critical care medicine.  Gastroenterology.  Palliative and hospice medicine    Wt Readings from Last 3 Encounters:  11/16/13 103 lb 9.6 oz (46.993 kg)  09/13/13 117 lb 1 oz (53.1 kg)  08/28/13 117 lb 4.6 oz (53.2 kg)   SpO2 Readings from Last 3 Encounters:  11/16/13 95%  09/15/13 96%  09/03/13 96%     Past Medical History  Diagnosis Date  . Atrial fibrillation   . Neuropathy     PERIPHERAL  . Carotid artery occlusion     LEFT  . Scoliosis   . Hoarseness   . Headache(784.0)   . Dizziness   . Abdominal bloating   . Bruises easily   . Pruritus   . Hypercholesterolemia   . Fall at home Sept 2013, Dec. 2013  Jun 08, 2012  . Varicose veins    Past Surgical History  Procedure Laterality Date  . Gallbladder surgery    . Abdominal hysterectomy  1954  . Spine surgery  1997  . Cholecystectomy  1997    GalL Bladder   History   Social History  . Marital Status: Widowed    Spouse Name: N/A    Number of Children: 2  . Years of Education: 12+   Social History Main Topics  . Smoking status: Never Smoker   . Smokeless tobacco: Never Used  . Alcohol Use: No  . Drug Use: No  . Sexual Activity: No   Other Topics Concern  . None   Social History Narrative   Patient lives at home alone.    Patient is a widowed.    Patient is retired.    Patient has no children but adopted twin sons.     Patient has a college degree.    Family History  Problem Relation Age of Onset  . Heart disease    . Diabetes Mother   . Heart attack Mother   . Heart disease Mother   . Heart attack Father   . Stroke Father   . Cancer Sister   . Heart disease Maternal Grandmother    Allergies  Allergen Reactions  . Tape Other (See Comments)    Tears skin. Band-Aids   Prior to Admission medications   Medication Sig Start Date End Date Taking? Authorizing Provider  acetaminophen (TYLENOL) 160 MG/5ML solution Take 31.3 mLs (1,000 mg total) by mouth every 6 (six) hours as needed (Pain). 09/03/13  Yes Lisette Abu,  PA-C  ALPRAZolam (XANAX) 0.25 MG tablet Take 0.25 mg by mouth at bedtime as needed for anxiety.   Yes Historical Provider, MD  Alum & Mag Hydroxide-Simeth (MAGIC MOUTHWASH) SOLN Take 5 mLs by mouth 3 (three) times daily. 09/15/13  Yes Verlee Monte, MD  atenolol (TENORMIN) 25 MG tablet TAKE 1 TABLET TWICE A DAY 11/13/13  Yes [name], MD  simvastatin (ZOCOR) 40 MG tablet Take 20 mg by mouth daily.   Yes Historical Provider, MD  Calcium-Vitamin D-Vitamin K (VIACTIV PO) Take 1 tablet by mouth daily.     Historical Provider, MD  Morphine Sulfate (MORPHINE CONCENTRATE) 10 mg / 0.5 ml concentrated solution Take 0.25 mLs (5 mg total) by mouth every 2 (two) hours as needed for moderate pain or shortness of breath. 09/15/13   Verlee Monte, MD  Multiple Vitamin (MULTIVITAMIN) tablet Take 1 tablet by mouth daily.      Historical Provider, MD  pantoprazole sodium (PROTONIX) 40 mg/20 mL PACK Place 20 mLs (40 mg total) into feeding tube 2 (two) times daily. 09/15/13   Verlee Monte, MD     ROS: The patient denies fevers, chills, night sweats, unintentional weight loss, chest pain, palpitations, wheezing,  nausea, vomiting, abdominal pain, dysuria, hematuria, melena, numbness, weakness, or tingling.   All other systems have been reviewed and were otherwise negative with the exception of those mentioned  in the HPI and as above.    PHYSICAL EXAM: Filed Vitals:   11/16/13 1559  BP: 120/71  Pulse: 80  Temp: 98.1 F (36.7 C)  Resp: 16   Filed Vitals:   11/16/13 1559  Height: 5' 2.5" (1.588 m)  Weight: 103 lb 9.6 oz (46.993 kg)  spo2 95% Body mass index is 18.64 kg/(m^2).  General: Alert, no acute distress HEENT:  Normocephalic, atraumatic, oropharynx patent. EOMI, PERRLA Cardiovascular:   no rubs murmurs or gallops.  Radial pulse intact. No pedal edema.  Respiratory: Decrease BS at bases otherwise  No wheezes, rales, or rhonchi.  No cyanosis, no use of accessory musculature GI: No organomegaly, abdomen is soft and non-tender, positive bowel sounds.  No masses. Skin: No rashes. Neurologic: Facial musculature symmetric. Psychiatric: Patient is appropriate throughout our interaction. Lymphatic: No cervical lymphadenopathy Musculoskeletal: Gait uses walker   LABS:    EKG/XRAY:   Primary read interpreted by Dr. Marin Comment at [facility] Bilateral pleural effusion, acute vs less  Possible chronic    ASSESSMENT/PLAN: Encounter Diagnoses  Name Primary?  . chest congestion   . Diarrhea   . Pleural effusion   . Acute on chronic congestive heart failure, unspecified congestive heart failure type Yes   This is a 80 y/o female with a PMH of CHF, Hyperlipidemia, Afib who was on coumadin but has not been on it since recent hospitalization from 8/31/-95/2015 for GIB d/t NSAID use for rib fracture from fall in August 2015 who is here for a 4 day history of worsening chest congestion, baseline vs acute SOB , and also rhinorrhea. She has not followed up with her PCP since her dc from 09/15/13.   She Has Afib and has not been on coumadin d/t GIB She was anemic d/t GIB and has not had her Hgb rechecked,last Hgb on 9/3 was 8.6  She has a hx of CHF and on today's  Chest xray images showed bilateral pleural effusion which I think may be acute  With early sxs of CHF, vs residual from prior  hospitalization She needs further evaluation in ER for labs to  see if she is in early failure and if she needs IV lasix or be put back on Lasix, anemia, creatinine fxn etc  She declines to go by EMS to ER which is ok at this time due to fact that her VSS,  She is not sure of her code status at this point since she had to decided her code status under duress last hospitalization.  I am unable to get stat labs at this time of day, Patient lives alone and has home aide . For all these factors I am sending her to get evaluated in ER. Patient prefers to go to ER.  F/u prn    Gross sideeffects, risk and benefits, and alternatives of medications d/w patient. Patient is aware that all medications have potential sideeffects and we are unable to predict every sideeffect or drug-drug interaction that may occur.  LE, [place], DO 11/16/2013 5:13 PM

## 2013-11-16 NOTE — Discharge Instructions (Signed)
Follow-up with your primary care doctor.  Return here as needed °

## 2013-11-16 NOTE — ED Provider Notes (Signed)
The patient presented to the emergency room with complaints of runny nose, shortness of breath and some chest discomfort. She denied any chest pain. She thought she was coming down with a cold she went to urgent care. They noted an abnormal chest x-ray and sent her to the emergency department Physical Exam  BP 152/73 mmHg  Pulse 85  Temp(Src) 98.9 F (37.2 C) (Oral)  Resp 18  SpO2 100%  Physical Exam  Constitutional: She appears well-developed and well-nourished. No distress.  HENT:  Head: Normocephalic and atraumatic.  Right Ear: External ear normal.  Left Ear: External ear normal.  Eyes: Conjunctivae are normal. Right eye exhibits no discharge. Left eye exhibits no discharge. No scleral icterus.  Neck: Neck supple. No tracheal deviation present.  Cardiovascular: Normal rate.   Pulmonary/Chest: Effort normal. No stridor. No respiratory distress. She has no wheezes. She has no rales.  Musculoskeletal: She exhibits no edema.  Neurological: She is alert. Cranial nerve deficit: no gross deficits.  Skin: Skin is warm and dry. No rash noted.  Psychiatric: She has a normal mood and affect.  Nursing note and vitals reviewed.   ED Course  Procedures  MDM The patient has minimal symptoms. It is most suggestive of an upper respiratory infection. The urgent care was worried about the chest x-ray findings. Comparison of her x-rays show that this is significantly better compared to the x-ray in September. Patient still has some mild system pleural effusions.  Slight increase BNP but overall doubt congestive heart failure. She has no pedal edema. Stable for outpatient follow up     Dorie Rank, MD 11/16/13 2056

## 2013-11-16 NOTE — ED Provider Notes (Signed)
CSN: [id number]     Arrival date & time 11/16/13  1726 History   First MD Initiated Contact with Patient 11/16/13 1746     Chief Complaint  Patient presents with  . Shortness of Breath     (Consider location/radiation/quality/duration/timing/severity/associated sxs/prior Treatment) HPI Patient presents to the emergency department with some mild shortness of breath and heaviness when she breathes deep.  The patient states she went to an urgent care for an evaluation of this sensation.  The urgent care, found that she has bilateral effusions on chest x-ray and sent her into the emergency department for further evaluation.  Patient states that she normally wears oxygen at night and she has the discretion to use it during the day as needed. The patient states that she broke 5 ribs in September and since that time has had some discomfort with breathing. the patient denies headache, blurred vision, neck pain, back pain, fever, cough, rash, dizziness, lightheadedness, anorexia or syncope.  The patient states that she never had true chest pain with this episode.  Past Medical History  Diagnosis Date  . Atrial fibrillation   . Neuropathy     PERIPHERAL  . Carotid artery occlusion     LEFT  . Scoliosis   . Hoarseness   . Headache(784.0)   . Dizziness   . Abdominal bloating   . Bruises easily   . Pruritus   . Hypercholesterolemia   . Fall at home Sept 2013, Dec. 2013  Jun 08, 2012  . Varicose veins    Past Surgical History  Procedure Laterality Date  . Gallbladder surgery    . Abdominal hysterectomy  1954  . Spine surgery  1997  . Cholecystectomy  1997    GalL Bladder   Family History  Problem Relation Age of Onset  . Heart disease    . Diabetes Mother   . Heart attack Mother   . Heart disease Mother   . Heart attack Father   . Stroke Father   . Cancer Sister   . Heart disease Maternal Grandmother    History  Substance Use Topics  . Smoking status: Never Smoker   . Smokeless  tobacco: Never Used  . Alcohol Use: No   OB History    No data available     Review of Systems  All other systems negative except as documented in the HPI. All pertinent positives and negatives as reviewed in the HPI.  Allergies  Tape  Home Medications   Prior to Admission medications   Medication Sig Start Date End Date Taking? Authorizing Provider  ALPRAZolam Duanne Moron) 0.25 MG tablet Take 0.25 mg by mouth at bedtime as needed for anxiety.   Yes Historical Provider, MD  aspirin 81 MG chewable tablet Chew 81 mg by mouth daily.   Yes Historical Provider, MD  atenolol (TENORMIN) 25 MG tablet Take 25 mg by mouth 2 (two) times daily.   Yes Historical Provider, MD  Calcium-Vitamin D-Vitamin K (VIACTIV PO) Take 1 tablet by mouth daily.    Yes Historical Provider, MD  furosemide (LASIX) 20 MG tablet Take 20 mg by mouth every morning.   Yes Historical Provider, MD  Multiple Vitamin (MULTIVITAMIN) tablet Take 1 tablet by mouth daily.     Yes Historical Provider, MD  omeprazole (PRILOSEC) 20 MG capsule Take 20 mg by mouth daily.   Yes Historical Provider, MD  simvastatin (ZOCOR) 40 MG tablet Take 20 mg by mouth every evening.    Yes Historical Provider, MD  acetaminophen (TYLENOL) 160 MG/5ML solution Take 31.3 mLs (1,000 mg total) by mouth every 6 (six) hours as needed (Pain). 09/03/13   Lisette Abu, PA-C  Alum & Mag Hydroxide-Simeth (MAGIC MOUTHWASH) SOLN Take 5 mLs by mouth 3 (three) times daily. 09/15/13   Verlee Monte, MD  atenolol (TENORMIN) 25 MG tablet TAKE 1 TABLET TWICE A DAY 11/13/13   [name], MD  Morphine Sulfate (MORPHINE CONCENTRATE) 10 mg / 0.5 ml concentrated solution Take 0.25 mLs (5 mg total) by mouth every 2 (two) hours as needed for moderate pain or shortness of breath. 09/15/13   Verlee Monte, MD  pantoprazole sodium (PROTONIX) 40 mg/20 mL PACK Place 20 mLs (40 mg total) into feeding tube 2 (two) times daily. 09/15/13   Mutaz Elmahi, MD   BP 152/73 mmHg  Pulse 85   Temp(Src) 98.9 F (37.2 C) (Oral)  Resp 18  SpO2 100% Physical Exam  Constitutional: She is oriented to person, place, and time. She appears well-developed and well-nourished. No distress.  HENT:  Head: Normocephalic and atraumatic.  Mouth/Throat: Oropharynx is clear and moist.  Eyes: Pupils are equal, round, and reactive to light.  Neck: Normal range of motion. Neck supple.  Cardiovascular: Normal rate, regular rhythm and normal heart sounds.  Exam reveals no gallop and no friction rub.   No murmur heard. Pulmonary/Chest: Effort normal and breath sounds normal. No respiratory distress. She has no wheezes. She exhibits no tenderness.  Musculoskeletal: She exhibits no edema.  Neurological: She is alert and oriented to person, place, and time. She exhibits normal muscle tone. Coordination normal.  Skin: Skin is warm and dry.  Psychiatric: She has a normal mood and affect. Her behavior is normal. Judgment and thought content normal.  Nursing note and vitals reviewed.   ED Course  Procedures (including critical care time) Labs Review Labs Reviewed  CBC - Abnormal; Notable for the following:    Hemoglobin 11.4 (*)    All other components within normal limits  BASIC METABOLIC PANEL - Abnormal; Notable for the following:    GFR calc non Af Amer 57 (*)    GFR calc Af Amer 66 (*)    All other components within normal limits  PRO B NATRIURETIC PEPTIDE - Abnormal; Notable for the following:    Pro B Natriuretic peptide (BNP) 878.2 (*)    All other components within normal limits  I-STAT TROPOININ, ED    Imaging Review Dg Chest 2 View  11/16/2013   CLINICAL DATA:  80 year old female with chest congestion and shortness of breath  EXAM: CHEST  2 VIEW  COMPARISON:  Prior chest x-ray 09/11/2013  FINDINGS: Markedly decreased left pleural effusion compared to the prior radiograph. There are moderate bilateral layering effusions and associated bibasilar atelectasis. Interstitial prominence and  central bronchitic changes consistent with a background of COPD are again noted. Biapical pleural parenchymal scarring. No acute osseous abnormality. Suspect subacute fracture of the left eighth rib. Cardiac and mediastinal contours are within normal limits. No evidence of pulmonary edema.  IMPRESSION: 1. Significantly improved left pleural effusion compared to 09/11/2013. 2. Moderate bilateral residual layering pleural effusions with associated bibasilar atelectasis. 3. Suspect subacute left eighth rib fracture.   Electronically Signed   By: Jacqulynn Cadet M.D.   On: 11/16/2013 17:45     EKG Interpretation   Date/Time:  Friday November 16 2013 17:57:03 EST Ventricular Rate:  90 PR Interval:    QRS Duration: 76 QT Interval:  367 QTC Calculation: 449 R  Axis:   77 Text Interpretation:  Atrial fibrillation RSR' in V1 or V2, probably  normal variant No significant change since last tracing Confirmed by KNAPP   MD-J, JON ([id number]) on 11/16/2013 6:31:18 PM      Patient has been stable here in the emergency department.  She is advised to follow-up with her primary care doctor.  I advised her that the pleural effusions have proved since her x-ray in September.  The patient agrees to the plan at all questions were answered.  The patient never had chest pressure and heaviness was more with deep breathing and not a central chest pressure or heaviness. This presentation seems atypical for cardiac chest pain but cannot be totally excluded. The patient is advised to follow up with her primary doctor along with her cardiologist    The patient has been seen by the attending Physician.  MDM   Final diagnoses:  None       Brent General, PA-C 11/16/13 2037

## 2013-11-16 NOTE — ED Notes (Signed)
Irena Cords PA at bedside speaking with patient and caregiver

## 2013-11-16 NOTE — ED Notes (Addendum)
Pt reports heaviness in chest, shortness of breath, and runny nose starting today around noon. Pt denies CP. Pt reports flu shot given last Friday. Pt hx of CHF. Pt was seen at urgent care and told to be evaluated here for fluid on lungs.

## 2013-11-19 DIAGNOSIS — R2689 Other abnormalities of gait and mobility: Secondary | ICD-10-CM | POA: Diagnosis not present

## 2013-11-19 DIAGNOSIS — S2242XS Multiple fractures of ribs, left side, sequela: Secondary | ICD-10-CM | POA: Diagnosis not present

## 2013-11-19 DIAGNOSIS — R634 Abnormal weight loss: Secondary | ICD-10-CM | POA: Diagnosis not present

## 2013-11-19 DIAGNOSIS — R296 Repeated falls: Secondary | ICD-10-CM | POA: Diagnosis not present

## 2013-11-19 DIAGNOSIS — I482 Chronic atrial fibrillation: Secondary | ICD-10-CM | POA: Diagnosis not present

## 2013-11-19 DIAGNOSIS — Z9981 Dependence on supplemental oxygen: Secondary | ICD-10-CM | POA: Diagnosis not present

## 2013-11-21 DIAGNOSIS — S2242XS Multiple fractures of ribs, left side, sequela: Secondary | ICD-10-CM | POA: Diagnosis not present

## 2013-11-21 DIAGNOSIS — Z9981 Dependence on supplemental oxygen: Secondary | ICD-10-CM | POA: Diagnosis not present

## 2013-11-21 DIAGNOSIS — I482 Chronic atrial fibrillation: Secondary | ICD-10-CM | POA: Diagnosis not present

## 2013-11-21 DIAGNOSIS — R296 Repeated falls: Secondary | ICD-10-CM | POA: Diagnosis not present

## 2013-11-21 DIAGNOSIS — R2689 Other abnormalities of gait and mobility: Secondary | ICD-10-CM | POA: Diagnosis not present

## 2013-11-21 DIAGNOSIS — R634 Abnormal weight loss: Secondary | ICD-10-CM | POA: Diagnosis not present

## 2013-11-23 DIAGNOSIS — R2689 Other abnormalities of gait and mobility: Secondary | ICD-10-CM | POA: Diagnosis not present

## 2013-11-23 DIAGNOSIS — S2242XS Multiple fractures of ribs, left side, sequela: Secondary | ICD-10-CM | POA: Diagnosis not present

## 2013-11-23 DIAGNOSIS — R634 Abnormal weight loss: Secondary | ICD-10-CM | POA: Diagnosis not present

## 2013-11-23 DIAGNOSIS — I482 Chronic atrial fibrillation: Secondary | ICD-10-CM | POA: Diagnosis not present

## 2013-11-23 DIAGNOSIS — R296 Repeated falls: Secondary | ICD-10-CM | POA: Diagnosis not present

## 2013-11-23 DIAGNOSIS — Z9981 Dependence on supplemental oxygen: Secondary | ICD-10-CM | POA: Diagnosis not present

## 2013-11-28 DIAGNOSIS — S2242XS Multiple fractures of ribs, left side, sequela: Secondary | ICD-10-CM | POA: Diagnosis not present

## 2013-11-28 DIAGNOSIS — I482 Chronic atrial fibrillation: Secondary | ICD-10-CM | POA: Diagnosis not present

## 2013-11-28 DIAGNOSIS — I480 Paroxysmal atrial fibrillation: Secondary | ICD-10-CM | POA: Diagnosis not present

## 2013-11-28 DIAGNOSIS — Z9981 Dependence on supplemental oxygen: Secondary | ICD-10-CM | POA: Diagnosis not present

## 2013-11-28 DIAGNOSIS — R634 Abnormal weight loss: Secondary | ICD-10-CM | POA: Diagnosis not present

## 2013-11-28 DIAGNOSIS — R0902 Hypoxemia: Secondary | ICD-10-CM | POA: Diagnosis not present

## 2013-11-28 DIAGNOSIS — R296 Repeated falls: Secondary | ICD-10-CM | POA: Diagnosis not present

## 2013-11-28 DIAGNOSIS — S2242XA Multiple fractures of ribs, left side, initial encounter for closed fracture: Secondary | ICD-10-CM | POA: Diagnosis not present

## 2013-11-28 DIAGNOSIS — R2689 Other abnormalities of gait and mobility: Secondary | ICD-10-CM | POA: Diagnosis not present

## 2013-11-28 DIAGNOSIS — F419 Anxiety disorder, unspecified: Secondary | ICD-10-CM | POA: Diagnosis not present

## 2013-11-30 DIAGNOSIS — R2689 Other abnormalities of gait and mobility: Secondary | ICD-10-CM | POA: Diagnosis not present

## 2013-11-30 DIAGNOSIS — Z9981 Dependence on supplemental oxygen: Secondary | ICD-10-CM | POA: Diagnosis not present

## 2013-11-30 DIAGNOSIS — I482 Chronic atrial fibrillation: Secondary | ICD-10-CM | POA: Diagnosis not present

## 2013-11-30 DIAGNOSIS — R296 Repeated falls: Secondary | ICD-10-CM | POA: Diagnosis not present

## 2013-11-30 DIAGNOSIS — R634 Abnormal weight loss: Secondary | ICD-10-CM | POA: Diagnosis not present

## 2013-11-30 DIAGNOSIS — S2242XS Multiple fractures of ribs, left side, sequela: Secondary | ICD-10-CM | POA: Diagnosis not present

## 2013-12-04 DIAGNOSIS — Z9981 Dependence on supplemental oxygen: Secondary | ICD-10-CM | POA: Diagnosis not present

## 2013-12-04 DIAGNOSIS — I482 Chronic atrial fibrillation: Secondary | ICD-10-CM | POA: Diagnosis not present

## 2013-12-04 DIAGNOSIS — R2689 Other abnormalities of gait and mobility: Secondary | ICD-10-CM | POA: Diagnosis not present

## 2013-12-04 DIAGNOSIS — R296 Repeated falls: Secondary | ICD-10-CM | POA: Diagnosis not present

## 2013-12-04 DIAGNOSIS — R634 Abnormal weight loss: Secondary | ICD-10-CM | POA: Diagnosis not present

## 2013-12-04 DIAGNOSIS — S2242XS Multiple fractures of ribs, left side, sequela: Secondary | ICD-10-CM | POA: Diagnosis not present

## 2013-12-07 DIAGNOSIS — S2242XS Multiple fractures of ribs, left side, sequela: Secondary | ICD-10-CM | POA: Diagnosis not present

## 2013-12-07 DIAGNOSIS — R296 Repeated falls: Secondary | ICD-10-CM | POA: Diagnosis not present

## 2013-12-07 DIAGNOSIS — Z9981 Dependence on supplemental oxygen: Secondary | ICD-10-CM | POA: Diagnosis not present

## 2013-12-07 DIAGNOSIS — R634 Abnormal weight loss: Secondary | ICD-10-CM | POA: Diagnosis not present

## 2013-12-07 DIAGNOSIS — R2689 Other abnormalities of gait and mobility: Secondary | ICD-10-CM | POA: Diagnosis not present

## 2013-12-07 DIAGNOSIS — I482 Chronic atrial fibrillation: Secondary | ICD-10-CM | POA: Diagnosis not present

## 2013-12-09 ENCOUNTER — Other Ambulatory Visit: Payer: Self-pay | Admitting: Cardiology

## 2013-12-10 DIAGNOSIS — R2689 Other abnormalities of gait and mobility: Secondary | ICD-10-CM | POA: Diagnosis not present

## 2013-12-10 DIAGNOSIS — R296 Repeated falls: Secondary | ICD-10-CM | POA: Diagnosis not present

## 2013-12-10 DIAGNOSIS — Z9981 Dependence on supplemental oxygen: Secondary | ICD-10-CM | POA: Diagnosis not present

## 2013-12-10 DIAGNOSIS — R634 Abnormal weight loss: Secondary | ICD-10-CM | POA: Diagnosis not present

## 2013-12-10 DIAGNOSIS — I482 Chronic atrial fibrillation: Secondary | ICD-10-CM | POA: Diagnosis not present

## 2013-12-10 DIAGNOSIS — S2242XS Multiple fractures of ribs, left side, sequela: Secondary | ICD-10-CM | POA: Diagnosis not present

## 2013-12-12 DIAGNOSIS — R634 Abnormal weight loss: Secondary | ICD-10-CM | POA: Diagnosis not present

## 2013-12-12 DIAGNOSIS — S2242XS Multiple fractures of ribs, left side, sequela: Secondary | ICD-10-CM | POA: Diagnosis not present

## 2013-12-12 DIAGNOSIS — R296 Repeated falls: Secondary | ICD-10-CM | POA: Diagnosis not present

## 2013-12-12 DIAGNOSIS — R2689 Other abnormalities of gait and mobility: Secondary | ICD-10-CM | POA: Diagnosis not present

## 2013-12-12 DIAGNOSIS — Z9981 Dependence on supplemental oxygen: Secondary | ICD-10-CM | POA: Diagnosis not present

## 2013-12-12 DIAGNOSIS — I482 Chronic atrial fibrillation: Secondary | ICD-10-CM | POA: Diagnosis not present

## 2013-12-14 DIAGNOSIS — R296 Repeated falls: Secondary | ICD-10-CM | POA: Diagnosis not present

## 2013-12-14 DIAGNOSIS — R634 Abnormal weight loss: Secondary | ICD-10-CM | POA: Diagnosis not present

## 2013-12-14 DIAGNOSIS — Z9981 Dependence on supplemental oxygen: Secondary | ICD-10-CM | POA: Diagnosis not present

## 2013-12-14 DIAGNOSIS — S2242XS Multiple fractures of ribs, left side, sequela: Secondary | ICD-10-CM | POA: Diagnosis not present

## 2013-12-14 DIAGNOSIS — I482 Chronic atrial fibrillation: Secondary | ICD-10-CM | POA: Diagnosis not present

## 2013-12-14 DIAGNOSIS — R2689 Other abnormalities of gait and mobility: Secondary | ICD-10-CM | POA: Diagnosis not present

## 2013-12-17 DIAGNOSIS — S2242XS Multiple fractures of ribs, left side, sequela: Secondary | ICD-10-CM | POA: Diagnosis not present

## 2013-12-17 DIAGNOSIS — R2689 Other abnormalities of gait and mobility: Secondary | ICD-10-CM | POA: Diagnosis not present

## 2013-12-17 DIAGNOSIS — R634 Abnormal weight loss: Secondary | ICD-10-CM | POA: Diagnosis not present

## 2013-12-17 DIAGNOSIS — Z9981 Dependence on supplemental oxygen: Secondary | ICD-10-CM | POA: Diagnosis not present

## 2013-12-17 DIAGNOSIS — I482 Chronic atrial fibrillation: Secondary | ICD-10-CM | POA: Diagnosis not present

## 2013-12-17 DIAGNOSIS — R296 Repeated falls: Secondary | ICD-10-CM | POA: Diagnosis not present

## 2013-12-19 ENCOUNTER — Other Ambulatory Visit: Payer: Self-pay | Admitting: *Deleted

## 2013-12-19 ENCOUNTER — Ambulatory Visit: Payer: Medicare Other | Admitting: Cardiology

## 2013-12-19 DIAGNOSIS — Z9981 Dependence on supplemental oxygen: Secondary | ICD-10-CM | POA: Diagnosis not present

## 2013-12-19 DIAGNOSIS — R634 Abnormal weight loss: Secondary | ICD-10-CM | POA: Diagnosis not present

## 2013-12-19 DIAGNOSIS — R296 Repeated falls: Secondary | ICD-10-CM | POA: Diagnosis not present

## 2013-12-19 DIAGNOSIS — I482 Chronic atrial fibrillation: Secondary | ICD-10-CM | POA: Diagnosis not present

## 2013-12-19 DIAGNOSIS — R2689 Other abnormalities of gait and mobility: Secondary | ICD-10-CM | POA: Diagnosis not present

## 2013-12-19 DIAGNOSIS — S2242XS Multiple fractures of ribs, left side, sequela: Secondary | ICD-10-CM | POA: Diagnosis not present

## 2013-12-19 NOTE — Progress Notes (Signed)
ERROR

## 2013-12-20 DIAGNOSIS — I482 Chronic atrial fibrillation: Secondary | ICD-10-CM | POA: Diagnosis not present

## 2013-12-20 DIAGNOSIS — R2689 Other abnormalities of gait and mobility: Secondary | ICD-10-CM | POA: Diagnosis not present

## 2013-12-20 DIAGNOSIS — R634 Abnormal weight loss: Secondary | ICD-10-CM | POA: Diagnosis not present

## 2013-12-20 DIAGNOSIS — R296 Repeated falls: Secondary | ICD-10-CM | POA: Diagnosis not present

## 2013-12-20 DIAGNOSIS — S2242XS Multiple fractures of ribs, left side, sequela: Secondary | ICD-10-CM | POA: Diagnosis not present

## 2013-12-20 DIAGNOSIS — Z9981 Dependence on supplemental oxygen: Secondary | ICD-10-CM | POA: Diagnosis not present

## 2013-12-20 NOTE — Progress Notes (Signed)
error 

## 2013-12-20 NOTE — Progress Notes (Deleted)
error 

## 2013-12-24 DIAGNOSIS — R2689 Other abnormalities of gait and mobility: Secondary | ICD-10-CM | POA: Diagnosis not present

## 2013-12-24 DIAGNOSIS — S2242XS Multiple fractures of ribs, left side, sequela: Secondary | ICD-10-CM | POA: Diagnosis not present

## 2013-12-24 DIAGNOSIS — I482 Chronic atrial fibrillation: Secondary | ICD-10-CM | POA: Diagnosis not present

## 2013-12-24 DIAGNOSIS — R634 Abnormal weight loss: Secondary | ICD-10-CM | POA: Diagnosis not present

## 2013-12-24 DIAGNOSIS — R296 Repeated falls: Secondary | ICD-10-CM | POA: Diagnosis not present

## 2013-12-24 DIAGNOSIS — Z9981 Dependence on supplemental oxygen: Secondary | ICD-10-CM | POA: Diagnosis not present

## 2013-12-25 ENCOUNTER — Other Ambulatory Visit: Payer: Self-pay | Admitting: *Deleted

## 2013-12-25 ENCOUNTER — Telehealth: Payer: Self-pay | Admitting: *Deleted

## 2013-12-25 NOTE — Telephone Encounter (Signed)
Dr Leward Quan this chart has been opened in error

## 2013-12-26 DIAGNOSIS — R634 Abnormal weight loss: Secondary | ICD-10-CM | POA: Diagnosis not present

## 2013-12-26 DIAGNOSIS — I482 Chronic atrial fibrillation: Secondary | ICD-10-CM | POA: Diagnosis not present

## 2013-12-26 DIAGNOSIS — S2242XS Multiple fractures of ribs, left side, sequela: Secondary | ICD-10-CM | POA: Diagnosis not present

## 2013-12-26 DIAGNOSIS — R2689 Other abnormalities of gait and mobility: Secondary | ICD-10-CM | POA: Diagnosis not present

## 2013-12-26 DIAGNOSIS — Z9981 Dependence on supplemental oxygen: Secondary | ICD-10-CM | POA: Diagnosis not present

## 2013-12-26 DIAGNOSIS — R296 Repeated falls: Secondary | ICD-10-CM | POA: Diagnosis not present

## 2013-12-28 DIAGNOSIS — Z9981 Dependence on supplemental oxygen: Secondary | ICD-10-CM | POA: Diagnosis not present

## 2013-12-28 DIAGNOSIS — S2242XS Multiple fractures of ribs, left side, sequela: Secondary | ICD-10-CM | POA: Diagnosis not present

## 2013-12-28 DIAGNOSIS — R634 Abnormal weight loss: Secondary | ICD-10-CM | POA: Diagnosis not present

## 2013-12-28 DIAGNOSIS — R2689 Other abnormalities of gait and mobility: Secondary | ICD-10-CM | POA: Diagnosis not present

## 2013-12-28 DIAGNOSIS — R296 Repeated falls: Secondary | ICD-10-CM | POA: Diagnosis not present

## 2013-12-28 DIAGNOSIS — I482 Chronic atrial fibrillation: Secondary | ICD-10-CM | POA: Diagnosis not present

## 2013-12-29 DIAGNOSIS — R2689 Other abnormalities of gait and mobility: Secondary | ICD-10-CM | POA: Diagnosis not present

## 2013-12-29 DIAGNOSIS — S2242XS Multiple fractures of ribs, left side, sequela: Secondary | ICD-10-CM | POA: Diagnosis not present

## 2013-12-29 DIAGNOSIS — Z9981 Dependence on supplemental oxygen: Secondary | ICD-10-CM | POA: Diagnosis not present

## 2013-12-29 DIAGNOSIS — I482 Chronic atrial fibrillation: Secondary | ICD-10-CM | POA: Diagnosis not present

## 2013-12-29 DIAGNOSIS — R296 Repeated falls: Secondary | ICD-10-CM | POA: Diagnosis not present

## 2013-12-29 DIAGNOSIS — R634 Abnormal weight loss: Secondary | ICD-10-CM | POA: Diagnosis not present

## 2014-01-02 DIAGNOSIS — R2681 Unsteadiness on feet: Secondary | ICD-10-CM | POA: Diagnosis not present

## 2014-01-02 DIAGNOSIS — F419 Anxiety disorder, unspecified: Secondary | ICD-10-CM | POA: Diagnosis not present

## 2014-01-02 DIAGNOSIS — I509 Heart failure, unspecified: Secondary | ICD-10-CM | POA: Diagnosis not present

## 2014-01-02 DIAGNOSIS — M5489 Other dorsalgia: Secondary | ICD-10-CM | POA: Diagnosis not present

## 2014-01-05 ENCOUNTER — Other Ambulatory Visit: Payer: Self-pay | Admitting: Cardiology

## 2014-01-07 NOTE — Telephone Encounter (Signed)
Rx refill sent to patient pharmacy   

## 2014-01-14 DIAGNOSIS — R296 Repeated falls: Secondary | ICD-10-CM | POA: Diagnosis not present

## 2014-01-14 DIAGNOSIS — I482 Chronic atrial fibrillation: Secondary | ICD-10-CM | POA: Diagnosis not present

## 2014-01-14 DIAGNOSIS — R2689 Other abnormalities of gait and mobility: Secondary | ICD-10-CM | POA: Diagnosis not present

## 2014-01-14 DIAGNOSIS — Z9981 Dependence on supplemental oxygen: Secondary | ICD-10-CM | POA: Diagnosis not present

## 2014-01-14 DIAGNOSIS — R634 Abnormal weight loss: Secondary | ICD-10-CM | POA: Diagnosis not present

## 2014-01-14 DIAGNOSIS — S2242XS Multiple fractures of ribs, left side, sequela: Secondary | ICD-10-CM | POA: Diagnosis not present

## 2014-01-16 DIAGNOSIS — R2689 Other abnormalities of gait and mobility: Secondary | ICD-10-CM | POA: Diagnosis not present

## 2014-01-16 DIAGNOSIS — Z9981 Dependence on supplemental oxygen: Secondary | ICD-10-CM | POA: Diagnosis not present

## 2014-01-16 DIAGNOSIS — S2242XS Multiple fractures of ribs, left side, sequela: Secondary | ICD-10-CM | POA: Diagnosis not present

## 2014-01-16 DIAGNOSIS — R296 Repeated falls: Secondary | ICD-10-CM | POA: Diagnosis not present

## 2014-01-16 DIAGNOSIS — R634 Abnormal weight loss: Secondary | ICD-10-CM | POA: Diagnosis not present

## 2014-01-16 DIAGNOSIS — I482 Chronic atrial fibrillation: Secondary | ICD-10-CM | POA: Diagnosis not present

## 2014-01-17 ENCOUNTER — Encounter: Payer: Self-pay | Admitting: Cardiology

## 2014-01-17 ENCOUNTER — Ambulatory Visit (INDEPENDENT_AMBULATORY_CARE_PROVIDER_SITE_OTHER): Payer: Medicare Other | Admitting: Cardiology

## 2014-01-17 VITALS — BP 122/60 | HR 72 | Ht 62.5 in | Wt 101.1 lb

## 2014-01-17 DIAGNOSIS — I482 Chronic atrial fibrillation, unspecified: Secondary | ICD-10-CM

## 2014-01-17 NOTE — Progress Notes (Addendum)
Vanessa [name] Date of Birth:  [birth year] [place] [address] [place], [place]  [address] [phone]        Fax   [phone]   History of Present Illness: This pleasant 81 year old widowed woman is seen for a scheduled four-month followup office visit. She has a history of atypical chest pain. She was seen in March 2013 with these symptoms and underwent a nuclear stress test subsequently which showed no ischemia. The patient reports that her symptoms have improved. She recently saw Dr. Cristina Gong who placed her on Nexium for dysphagia. She reports improvement in those symptoms since starting on Nexium.  She has a history of chronic established atrial fibrillation.  He previously had been on Coumadin for many years.  However she has had several devastating falls.  He had another fall this summer and was hospitalized and then transferred to skilled nursing facility for rehabilitation.  As a result of that fall she broke 5 ribs.  The fall occurred when she just lost her balance.  She has a history of vascular disease with known chronic occlusion of her left carotid artery. She has a past history of high blood pressure.  Her CHADSSVASC score is 5 (for age 86, female sex, high blood pressure, and vascular disease with occluded left internal carotid artery).  However she is no longer on warfarin because of her falls.  She has a known left carotid artery chronic occlusion.  She is followed for  vascular disease by Dr. Gae Gallop.  She also has a history of varicose veins. She brought in a list of her blood pressure readings from home for the last several weeks.  Her blood pressure has been remaining stable. She has 24-hour care presently in her home.  She is getting ready to move [facility] the [facility] living [facility] of [facility] [place].   Current Outpatient Prescriptions  Medication Sig Dispense Refill  . acetaminophen (TYLENOL) 160 MG/5ML solution Take 31.3 mLs (1,000 mg  total) by mouth every 6 (six) hours as needed (Pain). 120 mL 0  . ALPRAZolam (XANAX) 0.25 MG tablet Take 0.25 mg by mouth at bedtime as needed for anxiety.    Marland Kitchen aspirin 81 MG chewable tablet Chew 81 mg by mouth daily.    Marland Kitchen atenolol (TENORMIN) 25 MG tablet TAKE 1 TABLET TWICE A DAY 60 tablet 2  . furosemide (LASIX) 20 MG tablet Take 20 mg by mouth every morning.    . Morphine Sulfate (MORPHINE CONCENTRATE) 10 mg / 0.5 ml concentrated solution Take 0.25 mLs (5 mg total) by mouth every 2 (two) hours as needed for moderate pain or shortness of breath. 30 mL 0  . omeprazole (PRILOSEC) 20 MG capsule Take 20 mg by mouth daily.    . potassium chloride (K-DUR) 10 MEQ tablet Take 10 mEq by mouth daily.    . simvastatin (ZOCOR) 40 MG tablet Take 20 mg by mouth every evening.     . Calcium-Vitamin D-Vitamin K (VIACTIV PO) Take 1 tablet by mouth daily.     . Multiple Vitamin (MULTIVITAMIN) tablet Take 1 tablet by mouth daily.       No current facility-administered medications for this visit.    Allergies  Allergen Reactions  . Tape Other (See Comments)    Tears skin. Band-Aids    Patient Active Problem List   Diagnosis Date Noted  . Raynaud phenomenon 06/26/2010    Priority: High  . Osteoarthritis 06/26/2010    Priority:  High  . Neuropathy     Priority: High  . Palliative care encounter 09/12/2013  . Weakness generalized 09/12/2013  . Anxiety state, unspecified 09/12/2013  . GI bleed 09/10/2013  . Acute blood loss anemia 09/10/2013  . Pleural effusion 09/10/2013  . Acute respiratory failure with hypoxia 09/10/2013  . Other and unspecified ovarian cyst 09/10/2013  . Hypotension (arterial) 09/10/2013  . Pleural effusion, left 09/10/2013  . Fall 09/03/2013  . Multiple rib fractures 08/28/2013  . PVD (peripheral vascular disease) 02/21/2013  . Long term (current) use of anticoagulants 02/05/2013  . Encounter for therapeutic drug monitoring 02/05/2013  . Occlusion and stenosis of carotid  artery without mention of cerebral infarction 08/16/2012  . Varicose veins of lower extremities with other complications 19/50/9326  . Malaise and fatigue 10/06/2011  . Chest pain 04/02/2011  . Atrial fibrillation   . Hypercholesterolemia   . Scoliosis   . Hoarseness   . Headache   . Dizziness   . Bruises easily   . Pruritus     History  Smoking status  . Never Smoker   Smokeless tobacco  . Never Used    History  Alcohol Use No    Family History  Problem Relation Age of Onset  . Heart disease    . Diabetes Mother   . Heart attack Mother   . Heart disease Mother   . Heart attack Father   . Stroke Father   . Cancer Sister   . Heart disease Maternal Grandmother     Review of Systems: Constitutional: no fever chills diaphoresis or fatigue or change in weight.  Head and neck: no hearing loss, no epistaxis, no photophobia or visual disturbance. Respiratory: No cough, shortness of breath or wheezing. Cardiovascular: No chest pain peripheral edema, palpitations. Gastrointestinal: No abdominal distention, no abdominal pain, no change in bowel habits hematochezia or melena. Genitourinary: No dysuria, no frequency, no urgency, no nocturia. Musculoskeletal:No arthralgias, no back pain, no gait disturbance or myalgias. Neurological: No dizziness, no headaches, no numbness, no seizures, no syncope, no weakness, no tremors. Hematologic: No lymphadenopathy, no easy bruising. Psychiatric: No confusion, no hallucinations, no sleep disturbance.    Physical Exam: Filed Vitals:   01/17/14 1603  BP: 122/60  Pulse: 72   the general appearance reveals an alert elderly woman in no acute distress.The head and neck exam reveals pupils equal and reactive.  Extraocular movements are full.  There is no scleral icterus.  The mouth and pharynx are normal.  The neck is supple.  The carotids reveal no bruits.  The jugular venous pressure is normal.  The  thyroid is not enlarged.  There is no  lymphadenopathy.  The chest is clear to percussion and auscultation.  There are no rales or rhonchi.  Expansion of the chest is symmetrical.  The precordium is quiet.  The pulse is irregularly irregular  The first heart sound is normal.  The second heart sound is physiologically split.  There is soft basilar systolic murmur. There is no abnormal lift or heave.  The abdomen is soft and nontender.  The bowel sounds are normal.  The liver and spleen are not enlarged.  There are no abdominal masses.  There are no abdominal bruits.  Extremities reveal good pedal pulses.  There is no phlebitis or edema.  She does have significant varicose veins on both lower extremities. There is no cyanosis or clubbing.  Strength is normal and symmetrical in all extremities.  There is no lateralizing weakness.  There are no sensory deficits.  The skin is warm and dry.  There is no rash.  EKG today shows atrial fibrillation with controlled ventricular rate of 85/m.  No ischemic changes.  Assessment / Plan: 1.  Permanent atrial fibrillation.  Chadsvasc score is 5.  Not an anticoagulation candidate because of repeated severe falls.  Continue baby aspirin daily 2. known left carotid artery occlusion. 3. Hypercholesterolemia. 4.  Essential hypertension without heart failure 5.  Osteoarthritis  Continue same medication.  Recheck in 4 months for follow-up office visit.  When she moves to [facility] [place],, Dr. Jeanmarie Hubert will be her PCP.

## 2014-01-17 NOTE — Patient Instructions (Signed)
Your physician recommends that you continue on your current medications as directed. Please refer to the Current Medication list given to you today.  Your physician wants you to follow-up in: 4 month ov You will receive a reminder letter in the mail two months in advance. If you don't receive a letter, please call our office to schedule the follow-up appointment.  

## 2014-01-18 ENCOUNTER — Telehealth: Payer: Self-pay | Admitting: Cardiology

## 2014-01-18 DIAGNOSIS — R2689 Other abnormalities of gait and mobility: Secondary | ICD-10-CM | POA: Diagnosis not present

## 2014-01-18 DIAGNOSIS — Z9981 Dependence on supplemental oxygen: Secondary | ICD-10-CM | POA: Diagnosis not present

## 2014-01-18 DIAGNOSIS — R296 Repeated falls: Secondary | ICD-10-CM | POA: Diagnosis not present

## 2014-01-18 DIAGNOSIS — I482 Chronic atrial fibrillation: Secondary | ICD-10-CM | POA: Diagnosis not present

## 2014-01-18 DIAGNOSIS — S2242XS Multiple fractures of ribs, left side, sequela: Secondary | ICD-10-CM | POA: Diagnosis not present

## 2014-01-18 DIAGNOSIS — R634 Abnormal weight loss: Secondary | ICD-10-CM | POA: Diagnosis not present

## 2014-01-18 NOTE — Telephone Encounter (Signed)
Blood pressure from yesterday given to patient

## 2014-01-18 NOTE — Telephone Encounter (Signed)
New Msg           Please contact pt to inform her of BP reading from yesterday.

## 2014-01-21 DIAGNOSIS — Z9981 Dependence on supplemental oxygen: Secondary | ICD-10-CM | POA: Diagnosis not present

## 2014-01-21 DIAGNOSIS — R2689 Other abnormalities of gait and mobility: Secondary | ICD-10-CM | POA: Diagnosis not present

## 2014-01-21 DIAGNOSIS — S2242XS Multiple fractures of ribs, left side, sequela: Secondary | ICD-10-CM | POA: Diagnosis not present

## 2014-01-21 DIAGNOSIS — I482 Chronic atrial fibrillation: Secondary | ICD-10-CM | POA: Diagnosis not present

## 2014-01-21 DIAGNOSIS — R634 Abnormal weight loss: Secondary | ICD-10-CM | POA: Diagnosis not present

## 2014-01-21 DIAGNOSIS — R296 Repeated falls: Secondary | ICD-10-CM | POA: Diagnosis not present

## 2014-01-22 ENCOUNTER — Emergency Department (HOSPITAL_COMMUNITY)
Admission: EM | Admit: 2014-01-22 | Discharge: 2014-01-22 | Disposition: A | Discharge status: Home / Self Care | Payer: Medicare Other | Attending: Emergency Medicine | Admitting: Emergency Medicine

## 2014-01-22 ENCOUNTER — Emergency Department (HOSPITAL_COMMUNITY): Payer: Medicare Other

## 2014-01-22 ENCOUNTER — Encounter (HOSPITAL_COMMUNITY): Payer: Self-pay | Admitting: Emergency Medicine

## 2014-01-22 DIAGNOSIS — Z79899 Other long term (current) drug therapy: Secondary | ICD-10-CM | POA: Insufficient documentation

## 2014-01-22 DIAGNOSIS — E780 Pure hypercholesterolemia: Secondary | ICD-10-CM | POA: Diagnosis not present

## 2014-01-22 DIAGNOSIS — Z872 Personal history of diseases of the skin and subcutaneous tissue: Secondary | ICD-10-CM | POA: Diagnosis not present

## 2014-01-22 DIAGNOSIS — Z8669 Personal history of other diseases of the nervous system and sense organs: Secondary | ICD-10-CM | POA: Diagnosis not present

## 2014-01-22 DIAGNOSIS — R42 Dizziness and giddiness: Secondary | ICD-10-CM | POA: Diagnosis not present

## 2014-01-22 DIAGNOSIS — Z7982 Long term (current) use of aspirin: Secondary | ICD-10-CM | POA: Diagnosis not present

## 2014-01-22 DIAGNOSIS — Z8739 Personal history of other diseases of the musculoskeletal system and connective tissue: Secondary | ICD-10-CM | POA: Insufficient documentation

## 2014-01-22 DIAGNOSIS — Z8679 Personal history of other diseases of the circulatory system: Secondary | ICD-10-CM | POA: Insufficient documentation

## 2014-01-22 LAB — URINALYSIS, ROUTINE W REFLEX MICROSCOPIC
Bilirubin Urine: NEGATIVE
Glucose, UA: NEGATIVE mg/dL
Ketones, ur: NEGATIVE mg/dL
Nitrite: NEGATIVE
Protein, ur: NEGATIVE mg/dL
Specific Gravity, Urine: 1.014 (ref 1.005–1.030)
Urobilinogen, UA: 0.2 mg/dL (ref 0.0–1.0)
pH: 5.5 (ref 5.0–8.0)

## 2014-01-22 LAB — URINE MICROSCOPIC-ADD ON

## 2014-01-22 LAB — BASIC METABOLIC PANEL
Anion gap: 5 (ref 5–15)
BUN: 15 mg/dL (ref 6–23)
CO2: 27 mmol/L (ref 19–32)
Calcium: 8.8 mg/dL (ref 8.4–10.5)
Chloride: 103 mEq/L (ref 96–112)
Creatinine, Ser: 0.76 mg/dL (ref 0.50–1.10)
GFR calc Af Amer: 83 mL/min — ABNORMAL LOW (ref >90)
GFR calc non Af Amer: 72 mL/min — ABNORMAL LOW (ref >90)
Glucose, Bld: 178 mg/dL — ABNORMAL HIGH (ref 70–99)
Potassium: 3.9 mmol/L (ref 3.5–5.1)
Sodium: 135 mmol/L (ref 135–145)

## 2014-01-22 LAB — CBC
HCT: 34.8 % — ABNORMAL LOW (ref 36.0–46.0)
Hemoglobin: 10.7 g/dL — ABNORMAL LOW (ref 12.0–15.0)
MCH: 27 pg (ref 26.0–34.0)
MCHC: 30.7 g/dL (ref 30.0–36.0)
MCV: 87.9 fL (ref 78.0–100.0)
Platelets: 366 10*3/uL (ref 150–400)
RBC: 3.96 MIL/uL (ref 3.87–5.11)
RDW: 15.7 % — ABNORMAL HIGH (ref 11.5–15.5)
WBC: 8.8 10*3/uL (ref 4.0–10.5)

## 2014-01-22 LAB — I-STAT TROPONIN, ED: Troponin i, poc: 0 ng/mL (ref 0.00–0.08)

## 2014-01-22 MED ORDER — SODIUM CHLORIDE 0.9 % IV BOLUS (SEPSIS)
250.0000 mL | Freq: Once | INTRAVENOUS | Status: AC
  Administered 2014-01-22: 250 mL via INTRAVENOUS

## 2014-01-22 MED ORDER — MECLIZINE HCL 50 MG PO TABS: 50.0000 mg | ORAL_TABLET | Freq: Three times a day (TID) | ORAL | Status: DC | PRN

## 2014-01-22 NOTE — ED Provider Notes (Signed)
CSN: [id number]     Arrival date & time 01/22/14  1611 History   First MD Initiated Contact with Patient 01/22/14 1707     Chief Complaint  Patient presents with  . Dizziness     (Consider location/radiation/quality/duration/timing/severity/associated sxs/prior Treatment) Patient is a 81 y.o. female presenting with dizziness. The history is provided by the patient.  Dizziness Quality:  Head spinning Severity:  Moderate Onset quality:  Sudden Duration: several weeks. Timing:  Intermittent Progression:  Improving Chronicity:  Recurrent Context comment:  Worse with standing and rolling over in bed. Relieved by:  Nothing Exacerbated by: walking, standing, rolling over in bed. Ineffective treatments: ativan. Associated symptoms: no chest pain, no diarrhea, no headaches, no nausea, no shortness of breath and no vomiting     Past Medical History  Diagnosis Date  . Atrial fibrillation   . Neuropathy     PERIPHERAL  . Carotid artery occlusion     LEFT  . Scoliosis   . Hoarseness   . Headache(784.0)   . Dizziness   . Abdominal bloating   . Bruises easily   . Pruritus   . Hypercholesterolemia   . Fall at home Sept 2013, Dec. 2013  Jun 08, 2012  . Varicose veins    Past Surgical History  Procedure Laterality Date  . Gallbladder surgery    . Abdominal hysterectomy  1954  . Spine surgery  1997  . Cholecystectomy  1997    GalL Bladder   Family History  Problem Relation Age of Onset  . Heart disease    . Diabetes Mother   . Heart attack Mother   . Heart disease Mother   . Heart attack Father   . Stroke Father   . Cancer Sister   . Heart disease Maternal Grandmother    History  Substance Use Topics  . Smoking status: Never Smoker   . Smokeless tobacco: Never Used  . Alcohol Use: No   OB History    No data available     Review of Systems  Constitutional: Negative for fever and fatigue.  HENT: Negative for congestion and drooling.   Eyes: Negative for pain.   Respiratory: Negative for cough and shortness of breath.   Cardiovascular: Negative for chest pain.  Gastrointestinal: Negative for nausea, vomiting, abdominal pain and diarrhea.  Genitourinary: Negative for dysuria and hematuria.  Musculoskeletal: Negative for back pain, gait problem and neck pain.  Skin: Negative for color change.  Neurological: Positive for dizziness. Negative for headaches.  Hematological: Negative for adenopathy.  Psychiatric/Behavioral: Negative for behavioral problems.  All other systems reviewed and are negative.     Allergies  Tape  Home Medications   Prior to Admission medications   Medication Sig Start Date End Date Taking? Authorizing Provider  acetaminophen (TYLENOL) 160 MG/5ML solution Take 31.3 mLs (1,000 mg total) by mouth every 6 (six) hours as needed (Pain). 09/03/13  Yes Lisette Abu, PA-C  ALPRAZolam Duanne Moron) 0.25 MG tablet Take 0.25 mg by mouth at bedtime as needed for anxiety.   Yes Historical Provider, MD  aspirin 81 MG chewable tablet Chew 81 mg by mouth daily.   Yes Historical Provider, MD  atenolol (TENORMIN) 25 MG tablet TAKE 1 TABLET TWICE A DAY 11/13/13  Yes [name], MD  furosemide (LASIX) 20 MG tablet Take 20 mg by mouth every morning.   Yes Historical Provider, MD  Morphine Sulfate (MORPHINE CONCENTRATE) 10 mg / 0.5 ml concentrated solution Take 0.25 mLs (5 mg total) by  mouth every 2 (two) hours as needed for moderate pain or shortness of breath. 09/15/13  Yes Verlee Monte, MD  omeprazole (PRILOSEC) 20 MG capsule Take 20 mg by mouth daily.   Yes Historical Provider, MD  potassium chloride (K-DUR) 10 MEQ tablet Take 10 mEq by mouth daily. 01/05/14  Yes Historical Provider, MD  simvastatin (ZOCOR) 40 MG tablet Take 20 mg by mouth every evening.    Yes Historical Provider, MD   BP 133/55 mmHg  Pulse 107  Temp(Src) 97.6 F (36.4 C) (Oral)  Resp 19  SpO2 100% Physical Exam  Constitutional: She is oriented to person, place,  and time. She appears well-developed and well-nourished.  HENT:  Head: Normocephalic.  Mouth/Throat: Oropharynx is clear and moist. No oropharyngeal exudate.  Tympanic membranes clear bilaterally.  Eyes: Conjunctivae and EOM are normal. Pupils are equal, round, and reactive to light.  Neck: Normal range of motion. Neck supple.  Cardiovascular: Normal rate, regular rhythm, normal heart sounds and intact distal pulses.  Exam reveals no gallop and no friction rub.   No murmur heard. Pulmonary/Chest: Effort normal and breath sounds normal. No respiratory distress. She has no wheezes.  Abdominal: Soft. Bowel sounds are normal. There is no tenderness. There is no rebound and no guarding.  Musculoskeletal: Normal range of motion. She exhibits no edema or tenderness.  Neurological: She is alert and oriented to person, place, and time.  alert, oriented x3 speech: normal in context and clarity memory: intact grossly cranial nerves II-XII: intact motor strength: full proximally and distally no involuntary movements or tremors sensation: intact to light touch diffusely  cerebellar: finger-to-nose and heel-to-shin intact gait: normal forwards w minimal assistance  Skin: Skin is warm and dry.  Psychiatric: She has a normal mood and affect. Her behavior is normal.  Nursing note and vitals reviewed.   ED Course  Procedures (including critical care time) Labs Review Labs Reviewed  CBC - Abnormal; Notable for the following:    Hemoglobin 10.7 (*)    HCT 34.8 (*)    RDW 15.7 (*)    All other components within normal limits  BASIC METABOLIC PANEL - Abnormal; Notable for the following:    Glucose, Bld 178 (*)    GFR calc non Af Amer 72 (*)    GFR calc Af Amer 83 (*)    All other components within normal limits  URINALYSIS, ROUTINE W REFLEX MICROSCOPIC - Abnormal; Notable for the following:    Hgb urine dipstick TRACE (*)    Leukocytes, UA MODERATE (*)    All other components within normal  limits  URINE CULTURE  URINE MICROSCOPIC-ADD ON  Randolm Idol, ED    Imaging Review Ct Head Wo Contrast  01/22/2014   CLINICAL DATA:  Intermittent dizziness for 2 weeks, worsening today.  EXAM: CT HEAD WITHOUT CONTRAST  TECHNIQUE: Contiguous axial images were obtained from the base of the skull through the vertex without intravenous contrast.  COMPARISON:  06/08/2013  FINDINGS: The brainstem, cerebellum, cerebral peduncles, thalamus, basal ganglia, basilar cisterns, and ventricular system appear within normal limits. Periventricular white matter and corona radiata hypodensities favor chronic ischemic microvascular white matter disease.  No intracranial hemorrhage, mass lesion, or acute CVA. Middle ears unremarkable. Mastoid air cells appear normal. Internal auditory canals normal and symmetric.  IMPRESSION: 1. No findings to explain the patient's intermittent dizziness. 2. Periventricular white matter and corona radiata hypodensities favor chronic ischemic microvascular white matter disease.   Electronically Signed   By: Sherryl Barters  M.D.   On: 01/22/2014 19:03     EKG Interpretation   Date/Time:  Tuesday January 22 2014 18:06:40 EST Ventricular Rate:  88 PR Interval:    QRS Duration: 83 QT Interval:  390 QTC Calculation: 472 R Axis:   84 Text Interpretation:  Atrial fibrillation Borderline right axis deviation  RSR' in V1 or V2, probably normal variant No significant change since last  tracing Confirmed by Deserea Bordley  MD, Aleksandr Pellow ([id number]) on 01/22/2014 6:18:23 PM      MDM   Final diagnoses:  Dizziness    5:46 PM 81 y.o. female w hx of afib, dizziness, HLP who presents with intermittent dizziness over the last several weeks. She denies any headaches, fevers, vomiting, or diarrhea. She woke this morning with dizziness consistent with the dizziness she's been having intermittently over the last few weeks. Has persisted throughout the day but seems to be improving. She does ambulate  at baseline with a walker and is able to ambulate without a walker and minimal assistance with me. She is afebrile and vital signs are unremarkable on my exam. We'll get screening labs and imaging. Low suspicion for TIA versus CVA  given the intermittent symptoms. She also notes that it seems to be worse and prompted by rolling over in bed. It also seems to be positional and worse with movement. Likely peripheral cause of vertigo.   8:11 PM: Pt continues to appear well. She has an ENT but requested a new referral. I prescribed meclizine but warned her and her caregiver that this medication is sedating, should only be used on a prn basis, and can lead to falls if she is ambulatory on this medication and is concurrently dizzy. They understand.  I have discussed the diagnosis/risks/treatment options with the patient and caregiver and believe the pt to be eligible for discharge home to follow-up with ENT. We also discussed returning to the ED immediately if new or worsening sx occur. We discussed the sx which are most concerning (e.g., worsening dizziness, weakness, numbness, fever, HA) that necessitate immediate return. Medications administered to the patient during their visit and any new prescriptions provided to the patient are listed below.  Medications given during this visit Medications  sodium chloride 0.9 % bolus 250 mL (0 mLs Intravenous Stopped 01/22/14 2010)    New Prescriptions   MECLIZINE (ANTIVERT) 50 MG TABLET    Take 1 tablet (50 mg total) by mouth 3 (three) times daily as needed for dizziness.     Pamella Pert, MD 01/23/14 1048

## 2014-01-22 NOTE — ED Notes (Signed)
AVS explained in detail. Given number for medical records for access to all health information for assisted living transfer. No other questions/concerns. Ambulatory with walker with home health assist.

## 2014-01-22 NOTE — Discharge Instructions (Signed)

## 2014-01-22 NOTE — ED Notes (Signed)
Pt /o dizziness that has been off and on, cleared up and this past week has had some more dizziness but today got worse and decided to come in. Denies pain, SOB, n/v.

## 2014-01-23 DIAGNOSIS — F419 Anxiety disorder, unspecified: Secondary | ICD-10-CM | POA: Diagnosis not present

## 2014-01-23 DIAGNOSIS — I350 Nonrheumatic aortic (valve) stenosis: Secondary | ICD-10-CM | POA: Diagnosis not present

## 2014-01-23 DIAGNOSIS — I480 Paroxysmal atrial fibrillation: Secondary | ICD-10-CM | POA: Diagnosis not present

## 2014-01-23 DIAGNOSIS — I509 Heart failure, unspecified: Secondary | ICD-10-CM | POA: Diagnosis not present

## 2014-01-23 LAB — URINE CULTURE
Colony Count: NO GROWTH
Culture: NO GROWTH

## 2014-01-28 DIAGNOSIS — Z9981 Dependence on supplemental oxygen: Secondary | ICD-10-CM | POA: Diagnosis not present

## 2014-01-28 DIAGNOSIS — I482 Chronic atrial fibrillation: Secondary | ICD-10-CM | POA: Diagnosis not present

## 2014-01-28 DIAGNOSIS — R2689 Other abnormalities of gait and mobility: Secondary | ICD-10-CM | POA: Diagnosis not present

## 2014-01-28 DIAGNOSIS — R634 Abnormal weight loss: Secondary | ICD-10-CM | POA: Diagnosis not present

## 2014-01-28 DIAGNOSIS — S2242XS Multiple fractures of ribs, left side, sequela: Secondary | ICD-10-CM | POA: Diagnosis not present

## 2014-01-28 DIAGNOSIS — R296 Repeated falls: Secondary | ICD-10-CM | POA: Diagnosis not present

## 2014-02-04 DIAGNOSIS — R296 Repeated falls: Secondary | ICD-10-CM | POA: Diagnosis not present

## 2014-02-04 DIAGNOSIS — R634 Abnormal weight loss: Secondary | ICD-10-CM | POA: Diagnosis not present

## 2014-02-04 DIAGNOSIS — R2689 Other abnormalities of gait and mobility: Secondary | ICD-10-CM | POA: Diagnosis not present

## 2014-02-04 DIAGNOSIS — S2242XS Multiple fractures of ribs, left side, sequela: Secondary | ICD-10-CM | POA: Diagnosis not present

## 2014-02-04 DIAGNOSIS — Z9981 Dependence on supplemental oxygen: Secondary | ICD-10-CM | POA: Diagnosis not present

## 2014-02-04 DIAGNOSIS — I482 Chronic atrial fibrillation: Secondary | ICD-10-CM | POA: Diagnosis not present

## 2014-02-05 ENCOUNTER — Other Ambulatory Visit: Payer: Self-pay | Admitting: Cardiology

## 2014-02-06 DIAGNOSIS — R296 Repeated falls: Secondary | ICD-10-CM | POA: Diagnosis not present

## 2014-02-06 DIAGNOSIS — R634 Abnormal weight loss: Secondary | ICD-10-CM | POA: Diagnosis not present

## 2014-02-06 DIAGNOSIS — S2242XS Multiple fractures of ribs, left side, sequela: Secondary | ICD-10-CM | POA: Diagnosis not present

## 2014-02-06 DIAGNOSIS — I482 Chronic atrial fibrillation: Secondary | ICD-10-CM | POA: Diagnosis not present

## 2014-02-06 DIAGNOSIS — Z9981 Dependence on supplemental oxygen: Secondary | ICD-10-CM | POA: Diagnosis not present

## 2014-02-06 DIAGNOSIS — R2689 Other abnormalities of gait and mobility: Secondary | ICD-10-CM | POA: Diagnosis not present

## 2014-02-08 DIAGNOSIS — Z9981 Dependence on supplemental oxygen: Secondary | ICD-10-CM | POA: Diagnosis not present

## 2014-02-08 DIAGNOSIS — R2689 Other abnormalities of gait and mobility: Secondary | ICD-10-CM | POA: Diagnosis not present

## 2014-02-08 DIAGNOSIS — I482 Chronic atrial fibrillation: Secondary | ICD-10-CM | POA: Diagnosis not present

## 2014-02-08 DIAGNOSIS — S2242XS Multiple fractures of ribs, left side, sequela: Secondary | ICD-10-CM | POA: Diagnosis not present

## 2014-02-08 DIAGNOSIS — R634 Abnormal weight loss: Secondary | ICD-10-CM | POA: Diagnosis not present

## 2014-02-08 DIAGNOSIS — R296 Repeated falls: Secondary | ICD-10-CM | POA: Diagnosis not present

## 2014-02-11 DIAGNOSIS — R634 Abnormal weight loss: Secondary | ICD-10-CM | POA: Diagnosis not present

## 2014-02-11 DIAGNOSIS — R296 Repeated falls: Secondary | ICD-10-CM | POA: Diagnosis not present

## 2014-02-11 DIAGNOSIS — I482 Chronic atrial fibrillation: Secondary | ICD-10-CM | POA: Diagnosis not present

## 2014-02-11 DIAGNOSIS — Z9981 Dependence on supplemental oxygen: Secondary | ICD-10-CM | POA: Diagnosis not present

## 2014-02-11 DIAGNOSIS — R2689 Other abnormalities of gait and mobility: Secondary | ICD-10-CM | POA: Diagnosis not present

## 2014-02-11 DIAGNOSIS — S2242XS Multiple fractures of ribs, left side, sequela: Secondary | ICD-10-CM | POA: Diagnosis not present

## 2014-02-13 DIAGNOSIS — S2242XS Multiple fractures of ribs, left side, sequela: Secondary | ICD-10-CM | POA: Diagnosis not present

## 2014-02-13 DIAGNOSIS — Z9981 Dependence on supplemental oxygen: Secondary | ICD-10-CM | POA: Diagnosis not present

## 2014-02-13 DIAGNOSIS — I482 Chronic atrial fibrillation: Secondary | ICD-10-CM | POA: Diagnosis not present

## 2014-02-13 DIAGNOSIS — R2689 Other abnormalities of gait and mobility: Secondary | ICD-10-CM | POA: Diagnosis not present

## 2014-02-13 DIAGNOSIS — R634 Abnormal weight loss: Secondary | ICD-10-CM | POA: Diagnosis not present

## 2014-02-13 DIAGNOSIS — R296 Repeated falls: Secondary | ICD-10-CM | POA: Diagnosis not present

## 2014-02-14 ENCOUNTER — Telehealth: Payer: Self-pay | Admitting: Cardiology

## 2014-02-14 NOTE — Telephone Encounter (Signed)
Patient needing FL2 filled out and signed unitl she gets into see Dr Nyoka Cowden at [facility] Discussed with  Dr. Mare Ferrari and he will fill out form if she brings to our office

## 2014-02-14 NOTE — Telephone Encounter (Signed)
New message      Talk to [facility] before you leave for the day.  Pt would not tell me what she wanted.

## 2014-02-15 DIAGNOSIS — S2242XS Multiple fractures of ribs, left side, sequela: Secondary | ICD-10-CM | POA: Diagnosis not present

## 2014-02-15 DIAGNOSIS — R2689 Other abnormalities of gait and mobility: Secondary | ICD-10-CM | POA: Diagnosis not present

## 2014-02-15 DIAGNOSIS — I482 Chronic atrial fibrillation: Secondary | ICD-10-CM | POA: Diagnosis not present

## 2014-02-15 DIAGNOSIS — R634 Abnormal weight loss: Secondary | ICD-10-CM | POA: Diagnosis not present

## 2014-02-15 DIAGNOSIS — Z9981 Dependence on supplemental oxygen: Secondary | ICD-10-CM | POA: Diagnosis not present

## 2014-02-15 DIAGNOSIS — R296 Repeated falls: Secondary | ICD-10-CM | POA: Diagnosis not present

## 2014-02-18 ENCOUNTER — Telehealth: Payer: Self-pay | Admitting: Cardiology

## 2014-02-18 DIAGNOSIS — R296 Repeated falls: Secondary | ICD-10-CM | POA: Diagnosis not present

## 2014-02-18 DIAGNOSIS — S2242XS Multiple fractures of ribs, left side, sequela: Secondary | ICD-10-CM | POA: Diagnosis not present

## 2014-02-18 DIAGNOSIS — Z9981 Dependence on supplemental oxygen: Secondary | ICD-10-CM | POA: Diagnosis not present

## 2014-02-18 DIAGNOSIS — I482 Chronic atrial fibrillation: Secondary | ICD-10-CM | POA: Diagnosis not present

## 2014-02-18 DIAGNOSIS — R634 Abnormal weight loss: Secondary | ICD-10-CM | POA: Diagnosis not present

## 2014-02-18 DIAGNOSIS — R2689 Other abnormalities of gait and mobility: Secondary | ICD-10-CM | POA: Diagnosis not present

## 2014-02-18 NOTE — Telephone Encounter (Signed)
New message      Want to know status on Dr Mare Ferrari completing paper for pt to move to an assistant living facility

## 2014-02-18 NOTE — Telephone Encounter (Signed)
FL2 form faxed to [facility], confirmation received Patient aware

## 2014-02-26 DIAGNOSIS — Z9981 Dependence on supplemental oxygen: Secondary | ICD-10-CM | POA: Diagnosis not present

## 2014-02-26 DIAGNOSIS — I482 Chronic atrial fibrillation: Secondary | ICD-10-CM | POA: Diagnosis not present

## 2014-02-26 DIAGNOSIS — R634 Abnormal weight loss: Secondary | ICD-10-CM | POA: Diagnosis not present

## 2014-02-26 DIAGNOSIS — S2242XS Multiple fractures of ribs, left side, sequela: Secondary | ICD-10-CM | POA: Diagnosis not present

## 2014-02-26 DIAGNOSIS — R296 Repeated falls: Secondary | ICD-10-CM | POA: Diagnosis not present

## 2014-02-26 DIAGNOSIS — R2689 Other abnormalities of gait and mobility: Secondary | ICD-10-CM | POA: Diagnosis not present

## 2014-03-01 DIAGNOSIS — R2689 Other abnormalities of gait and mobility: Secondary | ICD-10-CM | POA: Diagnosis not present

## 2014-03-01 DIAGNOSIS — Z9981 Dependence on supplemental oxygen: Secondary | ICD-10-CM | POA: Diagnosis not present

## 2014-03-01 DIAGNOSIS — S2242XS Multiple fractures of ribs, left side, sequela: Secondary | ICD-10-CM | POA: Diagnosis not present

## 2014-03-01 DIAGNOSIS — I482 Chronic atrial fibrillation: Secondary | ICD-10-CM | POA: Diagnosis not present

## 2014-03-01 DIAGNOSIS — R634 Abnormal weight loss: Secondary | ICD-10-CM | POA: Diagnosis not present

## 2014-03-01 DIAGNOSIS — R296 Repeated falls: Secondary | ICD-10-CM | POA: Diagnosis not present

## 2014-03-04 ENCOUNTER — Other Ambulatory Visit: Payer: Self-pay | Admitting: *Deleted

## 2014-03-04 MED ORDER — FUROSEMIDE 20 MG PO TABS: 20.0000 mg | ORAL_TABLET | Freq: Every morning | ORAL | Status: DC

## 2014-03-07 ENCOUNTER — Telehealth: Payer: Self-pay | Admitting: Cardiology

## 2014-03-07 DIAGNOSIS — R2689 Other abnormalities of gait and mobility: Secondary | ICD-10-CM | POA: Diagnosis not present

## 2014-03-07 DIAGNOSIS — S2242XS Multiple fractures of ribs, left side, sequela: Secondary | ICD-10-CM | POA: Diagnosis not present

## 2014-03-07 DIAGNOSIS — R634 Abnormal weight loss: Secondary | ICD-10-CM | POA: Diagnosis not present

## 2014-03-07 DIAGNOSIS — Z9981 Dependence on supplemental oxygen: Secondary | ICD-10-CM | POA: Diagnosis not present

## 2014-03-07 DIAGNOSIS — I482 Chronic atrial fibrillation: Secondary | ICD-10-CM | POA: Diagnosis not present

## 2014-03-07 DIAGNOSIS — R296 Repeated falls: Secondary | ICD-10-CM | POA: Diagnosis not present

## 2014-03-07 NOTE — Telephone Encounter (Signed)
Advised evening nurse ok to mail. Patient does have appointment with Dr Carlota Raspberry 03/12/14

## 2014-03-07 NOTE — Telephone Encounter (Signed)
New Msg       Elmyra Ricks from [facility] calling, can pt admitting orders be mailed to Dr. Mare Ferrari for completion?   Please call at [phone] ext [phone]. Or receptionist can direct call to assisted living.

## 2014-03-11 NOTE — Progress Notes (Deleted)
   Subjective:    Patient ID: Vanessa [name], female    DOB: May 25, 1920, 81 y.o.   MRN: [id number]  HPI    Review of Systems     Objective:   Physical Exam        Assessment & Plan:

## 2014-03-12 ENCOUNTER — Encounter: Payer: PRIVATE HEALTH INSURANCE | Admitting: Internal Medicine

## 2014-03-14 DIAGNOSIS — D509 Iron deficiency anemia, unspecified: Secondary | ICD-10-CM | POA: Insufficient documentation

## 2014-04-02 ENCOUNTER — Encounter: Payer: Self-pay | Admitting: Internal Medicine

## 2014-04-02 ENCOUNTER — Non-Acute Institutional Stay: Payer: Medicare Other | Admitting: Internal Medicine

## 2014-04-02 VITALS — BP 126/68 | HR 60 | Temp 97.5°F | Ht 60.0 in | Wt 98.5 lb

## 2014-04-02 DIAGNOSIS — R1310 Dysphagia, unspecified: Secondary | ICD-10-CM | POA: Insufficient documentation

## 2014-04-02 DIAGNOSIS — M4120 Other idiopathic scoliosis, site unspecified: Secondary | ICD-10-CM | POA: Diagnosis not present

## 2014-04-02 DIAGNOSIS — D62 Acute posthemorrhagic anemia: Secondary | ICD-10-CM | POA: Diagnosis not present

## 2014-04-02 DIAGNOSIS — R1314 Dysphagia, pharyngoesophageal phase: Secondary | ICD-10-CM

## 2014-04-02 DIAGNOSIS — L820 Inflamed seborrheic keratosis: Secondary | ICD-10-CM | POA: Diagnosis not present

## 2014-04-02 DIAGNOSIS — I482 Chronic atrial fibrillation, unspecified: Secondary | ICD-10-CM

## 2014-04-02 DIAGNOSIS — R0609 Other forms of dyspnea: Secondary | ICD-10-CM | POA: Diagnosis not present

## 2014-04-02 DIAGNOSIS — H9190 Unspecified hearing loss, unspecified ear: Secondary | ICD-10-CM | POA: Insufficient documentation

## 2014-04-02 DIAGNOSIS — F411 Generalized anxiety disorder: Secondary | ICD-10-CM | POA: Diagnosis not present

## 2014-04-02 DIAGNOSIS — E7800 Pure hypercholesterolemia, unspecified: Secondary | ICD-10-CM

## 2014-04-02 DIAGNOSIS — R42 Dizziness and giddiness: Secondary | ICD-10-CM

## 2014-04-02 DIAGNOSIS — R739 Hyperglycemia, unspecified: Secondary | ICD-10-CM

## 2014-04-02 DIAGNOSIS — R634 Abnormal weight loss: Secondary | ICD-10-CM | POA: Diagnosis not present

## 2014-04-02 DIAGNOSIS — H9193 Unspecified hearing loss, bilateral: Secondary | ICD-10-CM

## 2014-04-02 DIAGNOSIS — R0600 Dyspnea, unspecified: Secondary | ICD-10-CM

## 2014-04-02 HISTORY — DX: Other forms of dyspnea: R06.09

## 2014-04-02 HISTORY — DX: Dyspnea, unspecified: R06.00

## 2014-04-02 HISTORY — DX: Hyperglycemia, unspecified: R73.9

## 2014-04-02 NOTE — Progress Notes (Signed)
Patient ID: Vanessa [name], female   DOB: [birth year], 81 y.o.   MRN: [id number]    HISTORY AND PHYSICAL  Location:  [facility] Room Number: 1 Place of Service: Clinic (12)   Extended Emergency Contact Information Primary Emergency Contact: Voigt,Russ Address: [address] [place] [address] Johnnette Litter of [place] Phone: [phone] Relation: Son Secondary Emergency Contact: Larina Bras [place] of [place] Phone: [phone] Mobile Phone: [phone] Relation: Son  Advanced Directive information Does patient have an advance directive?: Yes, Type of Advance Directive: Living will, Does patient want to make changes to advanced directive?: No - Patient declined  Chief Complaint  Patient presents with  . Medical Management of Chronic Issues    New Patient - switching doctors    HPI:  Patient has been under the care of Dr. Mare Ferrari for both general medical care as well as cardiology follow-up. She is adding [place] now as her PCP. He will continue to see her in cardiology.  Patient moved to [facility] assisted living to 2416. Patient fell August 2015. She fractured 5 ribs and was hospitalized and then went to [facility]. Following discharge from there she had home care at her own home 24/7. Costs increased and it was causing her $3000 a week for her caretakers. She moved to assisted living at [facility] 03/05/2014. Her plan currently is to move to independent living once she is more stable.  Dizziness: Chronic condition. Worse with movement. Turning over in bed can cause her to have problems. Has used meclizine in the past, but it was no help.  Chronic atrial fibrillation: Had been anticoagulated with warfarin, but this was stopped during the course of her hospitalization 09/10/2013 through 09/15/2013. She is now on aspirin 81 mg daily. Because of a GI bleed during her hospitalization, it is not  anticipated that she will resume taking warfarin.  Acute blood loss anemia: Hemoglobin fell to 8.6 g percent during her hospitalization. It substantively rose to 11.4, but in January 2016 was noted a 10.7 g percent. There has been no further acute blood loss that was noted.  Anxiety state: Mild and response to Xanax.  Hypercholesterolemia: Currently on simvastatin 20 mg daily  Hyperglycemia: Glucose 178 mg percent in January 2016.  Loss of weight: Has lost 18 pounds in the last 6-8 months.  DOE (dyspnea on exertion): Comfortable at rest but gets short of breath if she " does too much". This would seem to be walking more than 2-300 feet.  Idiopathic scoliosis: Remote history of surgery for partial correction of adult-onset idiopathic scoliosis. Continues to have some scoliosis evident and chronic back pains.  Hearing loss, bilateral: Wearing aids  Dysphagia, pharyngoesophageal phase: Capsules and large tablets stick in the posterior pharynx.  Seborrheic keratoses, inflamed: Chronic pruritus of the back and irritated multiple seborrheic keratoses. None bleeding.    Past Medical History  Diagnosis Date  . Atrial fibrillation   . Neuropathy     PERIPHERAL  . Carotid artery occlusion     LEFT  . Scoliosis   . Hoarseness   . Headache(784.0)   . Dizziness   . Abdominal bloating   . Bruises easily   . Pruritus   . Hypercholesterolemia   . Fall at home Sept 2013, Dec. 2013  Jun 08, 2012  . Varicose veins     Past Surgical History  Procedure Laterality Date  .  Abdominal hysterectomy  1954  . Spine surgery  1997    correct scoliosis  . Cholecystectomy  1997    Patient Care Team: Estill Dooms, MD as PCP - General (Internal Medicine) Angelia Mould, MD as Attending Physician (Vascular Surgery) Ronald Lobo, MD (Gastroenterology) Harle Battiest, MD as Attending Physician (Obstetrics and Gynecology) Amy Martinique, MD as Consulting Physician (Dermatology) Thornell Sartorius, MD (Otolaryngology) [name], MD (Orthopedic Surgery) [name], MD (Ophthalmology) [name], MD as Consulting Physician (Urology) [name], MD as Consulting Physician (Cardiology)  History   Social History  . Marital Status: Widowed    Spouse Name: N/A  . Number of Children: 2  . Years of Education: 12+   Occupational History  . 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 Smoking status: Never Smoker   . Smokeless tobacco: Never Used  . Alcohol Use: No  . Drug Use: No  . Sexual Activity: No   Other Topics Concern  . Not on file   Social History Narrative   Patient lives at [facility] 03/05/2014   Patient is a widowed.    Patient is retired.    Patient has no children but adopted twin sons.    Patient has a college degree.    Never smoked   Alcohol none   Exercise with therapy   Wants with cane   POA      reports that she has never smoked. She has never used smokeless tobacco. She reports that she does not drink alcohol or use illicit drugs.  Family History  Problem Relation Age of Onset  . Heart disease    . Diabetes Mother   . Heart attack Mother   . Heart disease Mother   . Heart attack Father   . Stroke Father   . Cancer Sister   . Heart disease Maternal Grandmother    Family Status  Relation Status Death Age  . Mother Deceased 7  . Father Deceased 59  . Sister Deceased 41  . Maternal Grandmother Deceased   . Brother Deceased 54    per health survey form-multi  . Maternal Grandfather Deceased   . Paternal Grandmother Deceased   . Paternal Grandfather Deceased     Immunization History  Administered Date(s) Administered  . Influenza Split 10/20/2011, 11/03/2012  . Influenza,inj,Quad PF,36+ Mos 09/29/2012, 11/09/2013  . PPD Test 08/21/2013  . Pneumococcal Conjugate-13 08/21/2013  . Tdap 05/20/2008    Allergies  Allergen Reactions  . Tape Other (See  Comments)    Tears skin. Band-Aids    Medications: Patient's Medications  New Prescriptions   No medications on file  Previous Medications   ALPRAZOLAM (XANAX) 0.25 MG TABLET    Take 0.25 mg by mouth at bedtime as needed for anxiety.   ASPIRIN 81 MG CHEWABLE TABLET    Chew 81 mg by mouth daily.   ATENOLOL (TENORMIN) 25 MG TABLET    TAKE 1 TABLET TWICE A DAY   CALCIUM CARBONATE 200 MG CAPSULE    Take 250 mg by mouth.   DOCUSATE SODIUM (COLACE) 100 MG CAPSULE    Take 100 mg by mouth. As needed for constipation   FUROSEMIDE (LASIX) 20 MG TABLET    Take 1 tablet (20 mg total) by mouth every morning.   MULTIPLE VITAMIN (MULTIVITAMIN) TABLET    Take 1 tablet by mouth daily.   OMEPRAZOLE (PRILOSEC) 20 MG CAPSULE  Take 20 mg by mouth daily.   POTASSIUM CHLORIDE (K-DUR) 10 MEQ TABLET    Take 10 mEq by mouth daily.   SIMVASTATIN (ZOCOR) 40 MG TABLET    Take 20 mg by mouth every evening.   Modified Medications   No medications on file  Discontinued Medications   ACETAMINOPHEN (TYLENOL) 160 MG/5ML SOLUTION    Take 31.3 mLs (1,000 mg total) by mouth every 6 (six) hours as needed (Pain).   MECLIZINE (ANTIVERT) 50 MG TABLET    Take 1 tablet (50 mg total) by mouth 3 (three) times daily as needed for dizziness.   MORPHINE SULFATE (MORPHINE CONCENTRATE) 10 MG / 0.5 ML CONCENTRATED SOLUTION    Take 0.25 mLs (5 mg total) by mouth every 2 (two) hours as needed for moderate pain or shortness of breath.    Review of Systems  Constitutional: Positive for appetite change, fatigue and unexpected weight change. Negative for fever, chills, diaphoresis and activity change.  HENT: Positive for hearing loss and trouble swallowing. Negative for congestion, ear discharge, ear pain, postnasal drip, rhinorrhea, sore throat, tinnitus and voice change.        Chronic sinus congestion. Dentures.  Eyes: Negative for pain, redness, itching and visual disturbance (Corrective lenses).  Respiratory: Positive for shortness  of breath (On exertion). Negative for cough, choking and wheezing.   Cardiovascular: Positive for palpitations. Negative for chest pain and leg swelling.       Hx AF  Gastrointestinal: Negative for nausea, abdominal pain, diarrhea, constipation and abdominal distention.       HX GI bleed In Sept 2015.  Endocrine: Negative for cold intolerance, heat intolerance, polydipsia, polyphagia and polyuria.       Elevated glucose 178 in Jan 2017.  Genitourinary: Negative for dysuria, urgency, frequency, hematuria, flank pain, vaginal discharge, difficulty urinating and pelvic pain.  Musculoskeletal: Positive for back pain and gait problem. Negative for myalgias, arthralgias, neck pain and neck stiffness.       Hx scoliosis and surgery of the lower back to partially correct it.  Skin: Negative for color change, pallor and rash.       Chronic pruritus the specimen back. Has multiple lesions of the back including irritated seborrheic keratoses, atrophic plaques, multiple angiomas.  Allergic/Immunologic: Negative.   Neurological: Positive for dizziness. Negative for tremors, seizures, syncope, weakness, numbness and headaches.       History of neuropathy with some numbness in the feet.  Hematological: Negative for adenopathy. Does not bruise/bleed easily.       Anemia for the last 6 months. Initially blamed on acute blood loss anemia secondary to GI bleed. Anemia has persisted since September 2015.  Psychiatric/Behavioral: Negative for suicidal ideas, hallucinations, behavioral problems, confusion, sleep disturbance, dysphoric mood and agitation. The patient is not nervous/anxious and is not hyperactive.     Filed Vitals:   04/02/14 1135  BP: 126/68  Pulse: 60  Temp: 97.5 F (36.4 C)  TempSrc: Oral  Height: 5' (1.524 m)  Weight: 98 lb 8 oz (44.679 kg)  SpO2: 91%   Body mass index is 19.24 kg/(m^2).  Physical Exam  Constitutional: She is oriented to person, place, and time. She appears  well-developed and well-nourished. No distress.  Thin, frail.  HENT:  Right Ear: External ear normal.  Left Ear: External ear normal.  Nose: Nose normal.  Mouth/Throat: Oropharynx is clear and moist. No oropharyngeal exudate.  Eyes: Conjunctivae and EOM are normal. Pupils are equal, round, and reactive to light. No scleral  icterus.  Neck: No JVD present. No tracheal deviation present. No thyromegaly present.  Cardiovascular: Normal rate, normal heart sounds and intact distal pulses.  Exam reveals no gallop and no friction rub.   No murmur heard. Atrial fibrillation. Controlled rate.  Pulmonary/Chest: Effort normal. No respiratory distress. She has no wheezes. She has no rales. She exhibits no tenderness.  Abdominal: She exhibits no distension and no mass. There is no tenderness.  Musculoskeletal: Normal range of motion. She exhibits no edema or tenderness.  Scar in lumbar area secondary prior surgery. Some difficulty lying down and getting up from the exam table.  Lymphadenopathy:    She has no cervical adenopathy.  Neurological: She is alert and oriented to person, place, and time. No cranial nerve deficit. Coordination normal.  Skin: No rash noted. She is not diaphoretic. No erythema. No pallor.  Psychiatric: She has a normal mood and affect. Her behavior is normal. Judgment and thought content normal.     Labs reviewed: Admission on 01/22/2014, Discharged on 01/22/2014  Component Date Value Ref Range Status  . WBC 01/22/2014 8.8  4.0 - 10.5 K/uL Final  . RBC 01/22/2014 3.96  3.87 - 5.11 MIL/uL Final  . Hemoglobin 01/22/2014 10.7* 12.0 - 15.0 g/dL Final  . HCT 01/22/2014 34.8* 36.0 - 46.0 % Final  . MCV 01/22/2014 87.9  78.0 - 100.0 fL Final  . MCH 01/22/2014 27.0  26.0 - 34.0 pg Final  . MCHC 01/22/2014 30.7  30.0 - 36.0 g/dL Final  . RDW 01/22/2014 15.7* 11.5 - 15.5 % Final  . Platelets 01/22/2014 366  150 - 400 K/uL Final  . Sodium 01/22/2014 135  135 - 145 mmol/L Final    Please note change in reference range.  . Potassium 01/22/2014 3.9  3.5 - 5.1 mmol/L Final   Please note change in reference range.  . Chloride 01/22/2014 103  96 - 112 mEq/L Final  . CO2 01/22/2014 27  19 - 32 mmol/L Final  . Glucose, Bld 01/22/2014 178* 70 - 99 mg/dL Final  . BUN 01/22/2014 15  6 - 23 mg/dL Final  . Creatinine, Ser 01/22/2014 0.76  0.50 - 1.10 mg/dL Final  . Calcium 01/22/2014 8.8  8.4 - 10.5 mg/dL Final  . GFR calc non Af Amer 01/22/2014 72* >90 mL/min Final  . GFR calc Af Amer 01/22/2014 83* >90 mL/min Final   Comment: (NOTE) The eGFR has been calculated using the CKD EPI equation. This calculation has not been validated in all clinical situations. eGFR's persistently <90 mL/min signify possible Chronic Kidney Disease.   . Anion gap 01/22/2014 5  5 - 15 Final  . Troponin i, poc 01/22/2014 0.00  0.00 - 0.08 ng/mL Final  . Comment 3 01/22/2014          Final   Comment: Due to the release kinetics of cTnI, a negative result within the first hours of the onset of symptoms does not rule out myocardial infarction with certainty. If myocardial infarction is still suspected, repeat the test at appropriate intervals.   . Color, Urine 01/22/2014 YELLOW  YELLOW Final  . APPearance 01/22/2014 CLEAR  CLEAR Final  . Specific Gravity, Urine 01/22/2014 1.014  1.005 - 1.030 Final  . pH 01/22/2014 5.5  5.0 - 8.0 Final  . Glucose, UA 01/22/2014 NEGATIVE  NEGATIVE mg/dL Final  . Hgb urine dipstick 01/22/2014 TRACE* NEGATIVE Final  . Bilirubin Urine 01/22/2014 NEGATIVE  NEGATIVE Final  . Ketones, ur 01/22/2014 NEGATIVE  NEGATIVE mg/dL  Final  . Protein, ur 01/22/2014 NEGATIVE  NEGATIVE mg/dL Final  . Urobilinogen, UA 01/22/2014 0.2  0.0 - 1.0 mg/dL Final  . Nitrite 01/22/2014 NEGATIVE  NEGATIVE Final  . Leukocytes, UA 01/22/2014 MODERATE* NEGATIVE Final  . Squamous Epithelial / LPF 01/22/2014 RARE  RARE Final  . WBC, UA 01/22/2014 21-50  <3 WBC/hpf Final  . RBC / HPF  01/22/2014 0-2  <3 RBC/hpf Final  . Bacteria, UA 01/22/2014 RARE  RARE Final  . Specimen Description 01/22/2014 URINE, CLEAN CATCH   Final  . Special Requests 01/22/2014 NONE   Final  . Colony Count 01/22/2014    Final                   Value:NO GROWTH Performed at [facility]   . Culture 01/22/2014    Final                   Value:NO GROWTH Performed at [facility]   . Report Status 01/22/2014 01/23/2014 FINAL   Final     Assessment/Plan  1. Dizziness History most consistent with BPPV  2. Chronic atrial fibrillation Controlled rate. Not anticoagulated secondary to GI bleed September 2015  3. Acute blood loss anemia Uncertain as to the etiology of her anemia. -CBC, B12, folate, reticulocyte count, serum iron, iron-binding capacity  4. Anxiety state Continue Xanax as needed  5. Hypercholesterolemia Continue simvastatin 20 mg daily -Lipid panel  6. Hyperglycemia -A1c, CMP  7. Loss of weight -CMP, TSH, A1c  8. DOE (dyspnea on exertion) Follow-up lab work. Had pleural effusion following fractured ribs in September. May need follow-up chest x-ray and spirometry.  9. Idiopathic scoliosis Unchanged  10. Hearing loss, bilateral Stable  11. Dysphagia, pharyngoesophageal phase Consider whether she needs esophagogram/ MBSS  12. Seborrheic keratoses, inflamed -1% hydrocortisone cream applied daily sparingly  I discontinued furosemide. She has been off KCl for at least a month because she cannot swallow the tablet.

## 2014-04-04 ENCOUNTER — Telehealth: Payer: Self-pay | Admitting: *Deleted

## 2014-04-04 DIAGNOSIS — D649 Anemia, unspecified: Secondary | ICD-10-CM | POA: Diagnosis not present

## 2014-04-04 DIAGNOSIS — R739 Hyperglycemia, unspecified: Secondary | ICD-10-CM | POA: Diagnosis not present

## 2014-04-04 DIAGNOSIS — E039 Hypothyroidism, unspecified: Secondary | ICD-10-CM | POA: Diagnosis not present

## 2014-04-04 DIAGNOSIS — J069 Acute upper respiratory infection, unspecified: Secondary | ICD-10-CM | POA: Diagnosis not present

## 2014-04-05 LAB — HEPATIC FUNCTION PANEL
ALT: 9 U/L (ref 7–35)
AST: 18 U/L (ref 13–35)
Alkaline Phosphatase: 102 U/L (ref 25–125)
Bilirubin, Total: 0.5 mg/dL

## 2014-04-05 LAB — CBC AND DIFFERENTIAL
HCT: 32 % — AB (ref 36–46)
Hemoglobin: 10.1 g/dL — AB (ref 12.0–16.0)
Platelets: 283 10*3/uL (ref 150–399)
WBC: 5.9 10*3/mL

## 2014-04-05 LAB — BASIC METABOLIC PANEL WITH GFR
BUN: 16 mg/dL (ref 4–21)
Creatinine: 0.8 mg/dL (ref 0.5–1.1)
Glucose: 85 mg/dL
Potassium: 4.2 mmol/L (ref 3.4–5.3)
Sodium: 138 mmol/L (ref 137–147)

## 2014-04-05 LAB — TSH: TSH: 2.22 u[IU]/mL (ref 0.41–5.90)

## 2014-04-05 LAB — LIPID PANEL
Cholesterol: 148 mg/dL (ref 0–200)
HDL: 76 mg/dL — AB (ref 35–70)
LDL Cholesterol: 53 mg/dL
Triglycerides: 94 mg/dL (ref 40–160)

## 2014-04-05 LAB — HEMOGLOBIN A1C: Hgb A1c MFr Bld: 6.6 % — AB (ref 4.0–6.0)

## 2014-04-10 NOTE — Telephone Encounter (Signed)
Opened in error--[place]

## 2014-04-11 ENCOUNTER — Encounter: Payer: Self-pay | Admitting: *Deleted

## 2014-04-11 NOTE — Telephone Encounter (Signed)
This encounter was created in error - please disregard.

## 2014-04-12 ENCOUNTER — Other Ambulatory Visit: Payer: Self-pay | Admitting: Internal Medicine

## 2014-04-12 ENCOUNTER — Non-Acute Institutional Stay: Payer: Medicare Other | Admitting: Nurse Practitioner

## 2014-04-12 DIAGNOSIS — I482 Chronic atrial fibrillation, unspecified: Secondary | ICD-10-CM

## 2014-04-12 DIAGNOSIS — F411 Generalized anxiety disorder: Secondary | ICD-10-CM

## 2014-04-12 DIAGNOSIS — D62 Acute posthemorrhagic anemia: Secondary | ICD-10-CM | POA: Diagnosis not present

## 2014-04-12 DIAGNOSIS — K922 Gastrointestinal hemorrhage, unspecified: Secondary | ICD-10-CM

## 2014-04-12 DIAGNOSIS — E7800 Pure hypercholesterolemia, unspecified: Secondary | ICD-10-CM

## 2014-04-12 DIAGNOSIS — R739 Hyperglycemia, unspecified: Secondary | ICD-10-CM

## 2014-04-12 NOTE — Progress Notes (Signed)
Patient ID: Vanessa [name], female   DOB: [birth year], 81 y.o.   MRN: [id number]   Code Status: DNR  Allergies  Allergen Reactions  . Tape Other (See Comments)    Tears skin. Band-Aids    Chief Complaint  Patient presents with  . Medical Management of Chronic Issues    HPI: Patient is a 81 y.o. female seen in the AL at [facility] today for  evaluation of chronic medical conditions Problem List Items Addressed This Visit    Acute blood loss anemia    04/05/14 Hgb 10.1, Fe 30 Adding Fe 325mg  po daily update CBC one month.        Anxiety state    Daily prn Xanax 0.25mg  is adequate for her.       Atrial fibrillation    Heart rate is in control, takes Atenolol 25mg  bid.       GI bleed - Primary    Hx of GI bleed. Low serum Fe 30 04/05/14, continue Omeprazole 20mg  bid po.       Hypercholesterolemia    04/05/14 LDL 53, continue Simvastatin 20mg  daily.        Hyperglycemia    Glucose 178 mg percent on 01/22/2014 04/05/14 Hgb A1c 6.6 Diet controlled.          Review of Systems:  Review of Systems  Constitutional: Positive for appetite change, fatigue and unexpected weight change. Negative for fever, chills, diaphoresis and activity change.  HENT: Positive for hearing loss and trouble swallowing. Negative for congestion, ear discharge, ear pain, postnasal drip, rhinorrhea, sore throat, tinnitus and voice change.        Chronic sinus congestion. Dentures.  Eyes: Negative for pain, redness, itching and visual disturbance (Corrective lenses).  Respiratory: Positive for shortness of breath (On exertion). Negative for cough, choking and wheezing.   Cardiovascular: Positive for palpitations. Negative for chest pain and leg swelling.       Hx AF  Gastrointestinal: Negative for nausea, abdominal pain, diarrhea, constipation and abdominal distention.       HX GI bleed In Sept 2015.  Endocrine: Negative for cold intolerance, heat intolerance, polydipsia, polyphagia and  polyuria.       Elevated glucose 178 in Jan 2017.  Genitourinary: Negative for dysuria, urgency, frequency, hematuria, flank pain, vaginal discharge, difficulty urinating and pelvic pain.  Musculoskeletal: Positive for back pain and gait problem. Negative for myalgias, arthralgias, neck pain and neck stiffness.       Hx scoliosis and surgery of the lower back to partially correct it.  Skin: Negative for color change, pallor and rash.       Chronic pruritus the specimen back. Has multiple lesions of the back including irritated seborrheic keratoses, atrophic plaques, multiple angiomas.  Allergic/Immunologic: Negative.   Neurological: Positive for dizziness. Negative for tremors, seizures, syncope, weakness, numbness and headaches.       History of neuropathy with some numbness in the feet.  Hematological: Negative for adenopathy. Does not bruise/bleed easily.       Anemia for the last 6 months. Initially blamed on acute blood loss anemia secondary to GI bleed. Anemia has persisted since September 2015.  Psychiatric/Behavioral: Negative for suicidal ideas, hallucinations, behavioral problems, confusion, sleep disturbance, dysphoric mood and agitation. The patient is not nervous/anxious and is not hyperactive.      Past Medical History  Diagnosis Date  . Atrial fibrillation   . Neuropathy     PERIPHERAL  . Carotid artery occlusion  LEFT  . Scoliosis   . Hoarseness   . Headache(784.0)   . Dizziness   . Abdominal bloating   . Bruises easily   . Pruritus   . Hypercholesterolemia   . Fall at home Sept 2013, Dec. 2013  Jun 08, 2012  . Varicose veins    Past Surgical History  Procedure Laterality Date  . Abdominal hysterectomy  1954  . Spine surgery  1997    correct scoliosis  . Cholecystectomy  1997   Social History:   reports that she has never smoked. She has never used smokeless tobacco. She reports that she does not drink alcohol or use illicit drugs.  Family History    Problem Relation Age of Onset  . Heart disease    . Diabetes Mother   . Heart attack Mother   . Heart disease Mother   . Heart attack Father   . Stroke Father   . Cancer Sister   . Heart disease Maternal Grandmother     Medications: Patient's Medications  New Prescriptions   No medications on file  Previous Medications   ALPRAZOLAM (XANAX) 0.25 MG TABLET    Take 0.25 mg by mouth at bedtime as needed for anxiety.   ASPIRIN 81 MG CHEWABLE TABLET    Chew 81 mg by mouth daily.   ATENOLOL (TENORMIN) 25 MG TABLET    TAKE 1 TABLET TWICE A DAY   CALCIUM CARBONATE 200 MG CAPSULE    Take 250 mg by mouth.   DOCUSATE SODIUM (COLACE) 100 MG CAPSULE    Take 100 mg by mouth. As needed for constipation   FERROUS SULFATE 220 (44 FE) MG/5ML SOLUTION    TAKE 7ML ONCE DAILY TO GIVE EQUIVALENT DOSE OF FESO4 325MG ( 65MG  OF ELEMENTAL IRON).   MULTIPLE VITAMIN (MULTIVITAMIN) TABLET    Take 1 tablet by mouth daily.   OMEPRAZOLE (PRILOSEC) 20 MG CAPSULE    Take 20 mg by mouth daily.   SIMVASTATIN (ZOCOR) 40 MG TABLET    Take 20 mg by mouth every evening.   Modified Medications   No medications on file  Discontinued Medications   No medications on file     Physical Exam: Physical Exam  Constitutional: She is oriented to person, place, and time. She appears well-developed and well-nourished. No distress.  Thin, frail.  HENT:  Right Ear: External ear normal.  Left Ear: External ear normal.  Nose: Nose normal.  Mouth/Throat: Oropharynx is clear and moist. No oropharyngeal exudate.  Eyes: Conjunctivae and EOM are normal. Pupils are equal, round, and reactive to light. No scleral icterus.  Neck: No JVD present. No tracheal deviation present. No thyromegaly present.  Cardiovascular: Normal rate, normal heart sounds and intact distal pulses.  Exam reveals no gallop and no friction rub.   No murmur heard. Atrial fibrillation. Controlled rate.  Pulmonary/Chest: Effort normal. No respiratory distress. She  has no wheezes. She has no rales. She exhibits no tenderness.  Abdominal: She exhibits no distension and no mass. There is no tenderness.  Musculoskeletal: Normal range of motion. She exhibits no edema or tenderness.  Scar in lumbar area secondary prior surgery. Some difficulty lying down and getting up from the exam table.  Lymphadenopathy:    She has no cervical adenopathy.  Neurological: She is alert and oriented to person, place, and time. No cranial nerve deficit. Coordination normal.  Skin: No rash noted. She is not diaphoretic. No erythema. No pallor.  Psychiatric: She has a normal mood and affect.  Her behavior is normal. Judgment and thought content normal.    Filed Vitals:   04/12/14 1538  BP: 126/80  Pulse: 80  Temp: 97.2 F (36.2 C)  TempSrc: Tympanic  Resp: 20      Labs reviewed: Basic Metabolic Panel:  Recent Labs  09/11/13 0500 09/12/13 0530 09/13/13 0600 11/16/13 1754 01/22/14 1632 04/05/14  NA 142 144 142 139 135 138  K 4.0 3.7 3.6* 4.1 3.9 4.2  CL 106 105 105 99 103  --   CO2 27 30 28 26 27   --   GLUCOSE 98 90 109* 94 178*  --   BUN 30* 33* 25* 13 15 16   CREATININE 0.75 0.90 0.78 0.86 0.76 0.8  CALCIUM 8.5 8.6 8.7 10.0 8.8  --   MG 1.9 2.0 1.9  --   --   --   PHOS 2.6 3.2 3.1  --   --   --   TSH  --   --   --   --   --  2.22   Liver Function Tests:  Recent Labs  07/09/13 0849 09/10/13 0449 04/05/14  AST 28 19 18   ALT 14 15 9   ALKPHOS 92 105 102  BILITOT 0.9 0.3  --   PROT 7.5 6.0  --   ALBUMIN 3.9 2.4*  --    No results for input(s): LIPASE, AMYLASE in the last 8760 hours. No results for input(s): AMMONIA in the last 8760 hours. CBC:  Recent Labs  09/11/13 0500  09/12/13 0530  09/13/13 0600 11/16/13 1754 01/22/14 1632 04/05/14  WBC 13.0*  --  13.9*  --  11.7* 9.7 8.8 5.9  NEUTROABS 9.5*  --  10.4*  --  9.1*  --   --   --   HGB 9.6*  < > 9.1*  < > 8.6* 11.4* 10.7* 10.1*  HCT 28.3*  < > 26.6*  < > 25.5* 36.5 34.8* 32*  MCV 91.9   --  90.2  --  92.1 91.5 87.9  --   PLT 258  --  309  --  293 298 366 283  < > = values in this interval not displayed. Lipid Panel:  Recent Labs  07/09/13 0849 04/05/14  CHOL 197 148  HDL 77.20 76*  LDLCALC 95 53  TRIG 126.0 94  CHOLHDL 3  --      Past Procedures:  09/10/13 CT abd and pelvis with contrast:   IMPRESSION: 1. No acute abdominal or pelvic findings. No explanation for elevated white blood cell count. 2. Bilateral moderate to large pleural effusions with associated atelectasis. 3. Posterior left rib fractures. 4. Large benign appearing cyst associated with the right ovary. In late postmenopausal female, recommend abdominal ultrasound further evaluation.   09/11/14 echocardiogram: EF 60-65%  01/22/14 CT head wo contrast:   IMPRESSION: 1. No findings to explain the patient's intermittent dizziness. 2. Periventricular white matter and corona radiata hypodensities favor chronic ischemic microvascular white matter disease.   Assessment/Plan GI bleed Hx of GI bleed. Low serum Fe 30 04/05/14, continue Omeprazole 20mg  bid po.    Acute blood loss anemia 04/05/14 Hgb 10.1, Fe 30 Adding Fe 325mg  po daily update CBC one month.     Hyperglycemia Glucose 178 mg percent on 01/22/2014 04/05/14 Hgb A1c 6.6 Diet controlled.    Hypercholesterolemia 04/05/14 LDL 53, continue Simvastatin 20mg  daily.     Atrial fibrillation Heart rate is in control, takes Atenolol 25mg  bid.    Anxiety state Daily prn Xanax  0.25mg  is adequate for her.      Family/ Staff Communication: observe the patient.   Goals of Care: AL  Labs/tests ordered: CBC one month.

## 2014-04-18 ENCOUNTER — Encounter: Payer: Self-pay | Admitting: *Deleted

## 2014-04-18 NOTE — Telephone Encounter (Signed)
This encounter was created in error - please disregard.

## 2014-04-21 ENCOUNTER — Encounter: Payer: Self-pay | Admitting: Nurse Practitioner

## 2014-04-21 NOTE — Assessment & Plan Note (Signed)
Glucose 178 mg percent on 01/22/2014 04/05/14 Hgb A1c 6.6 Diet controlled.

## 2014-04-21 NOTE — Assessment & Plan Note (Signed)
04/05/14 LDL 53, continue Simvastatin 20mg  daily.

## 2014-04-21 NOTE — Assessment & Plan Note (Signed)
Heart rate is in control, takes Atenolol 25mg  bid.

## 2014-04-21 NOTE — Assessment & Plan Note (Signed)
Daily prn Xanax 0.25mg  is adequate for her.

## 2014-04-21 NOTE — Assessment & Plan Note (Signed)
04/05/14 Hgb 10.1, Fe 30 Adding Fe 325mg  po daily update CBC one month.

## 2014-04-21 NOTE — Assessment & Plan Note (Signed)
Hx of GI bleed. Low serum Fe 30 04/05/14, continue Omeprazole 20mg  bid po.

## 2014-04-29 DIAGNOSIS — J069 Acute upper respiratory infection, unspecified: Secondary | ICD-10-CM | POA: Diagnosis not present

## 2014-04-29 LAB — CBC AND DIFFERENTIAL
HCT: 36 % (ref 36–46)
Hemoglobin: 11.6 g/dL — AB (ref 12.0–16.0)
Platelets: 306 10*3/uL (ref 150–399)
WBC: 7.9 10^3/mL

## 2014-04-30 ENCOUNTER — Encounter: Payer: Self-pay | Admitting: Internal Medicine

## 2014-04-30 ENCOUNTER — Non-Acute Institutional Stay: Payer: Medicare Other | Admitting: Internal Medicine

## 2014-04-30 VITALS — BP 140/82 | HR 80 | Temp 97.5°F | Wt 99.0 lb

## 2014-04-30 DIAGNOSIS — L820 Inflamed seborrheic keratosis: Secondary | ICD-10-CM

## 2014-04-30 DIAGNOSIS — K922 Gastrointestinal hemorrhage, unspecified: Secondary | ICD-10-CM | POA: Diagnosis not present

## 2014-04-30 DIAGNOSIS — R739 Hyperglycemia, unspecified: Secondary | ICD-10-CM | POA: Diagnosis not present

## 2014-04-30 DIAGNOSIS — D509 Iron deficiency anemia, unspecified: Secondary | ICD-10-CM

## 2014-04-30 DIAGNOSIS — R42 Dizziness and giddiness: Secondary | ICD-10-CM

## 2014-04-30 DIAGNOSIS — E7800 Pure hypercholesterolemia, unspecified: Secondary | ICD-10-CM

## 2014-04-30 DIAGNOSIS — K5900 Constipation, unspecified: Secondary | ICD-10-CM | POA: Insufficient documentation

## 2014-04-30 DIAGNOSIS — K5901 Slow transit constipation: Secondary | ICD-10-CM

## 2014-04-30 DIAGNOSIS — Z7901 Long term (current) use of anticoagulants: Secondary | ICD-10-CM | POA: Diagnosis not present

## 2014-04-30 MED ORDER — PSYLLIUM 58.6 % PO POWD: ORAL | Status: DC

## 2014-04-30 NOTE — Progress Notes (Signed)
Patient ID: Vanessa [name], female   DOB: 1920-12-22, 81 y.o.   MRN: [id number]    Wise Room Number: 01  Place of Service: Clinic (12)     Allergies  Allergen Reactions  . Tape Other (See Comments)    Tears skin. Band-Aids    Chief Complaint  Patient presents with  . Medical Management of Chronic Issues    3 week follow-up dizziness, anemia, weight loss  . Constipation    since on iron pills. Stools are little round balls, passing gas.     HPI:  Iron deficiency anemia: Hemoglobin 11.6. Has risen a gram. Back to normal.  Long term current use of anticoagulant therapy: Therapeutic values  Seborrheic keratoses, inflamed: Several on the face and trunk. She thinks she would like to go to the [facility].  Slow transit constipation: Insufficient fluid intake. Her sulfate may be aggravating the condition.  Dizziness: Improved  Hyperglycemia: Glucose 85 mg percent on March lab  Hypercholesterolemia    Medications: Patient's Medications  New Prescriptions   No medications on file  Previous Medications   ALPRAZOLAM (XANAX) 0.25 MG TABLET    Take 0.25 mg by mouth at bedtime as needed for anxiety.   ASPIRIN 81 MG CHEWABLE TABLET    Chew 81 mg by mouth daily.   ATENOLOL (TENORMIN) 25 MG TABLET    TAKE 1 TABLET TWICE A DAY   CALCIUM CARBONATE 200 MG CAPSULE    Take 250 mg by mouth.   DOCUSATE SODIUM (COLACE) 100 MG CAPSULE    Take 100 mg by mouth. As needed for constipation   FERROUS SULFATE 220 (44 FE) MG/5ML SOLUTION    TAKE 7ML ONCE DAILY TO GIVE EQUIVALENT DOSE OF FESO4 325MG ( 65MG  OF ELEMENTAL IRON).   MULTIPLE VITAMIN (MULTIVITAMIN) TABLET    Take 1 tablet by mouth daily.   OMEPRAZOLE (PRILOSEC) 20 MG CAPSULE    Take 20 mg by mouth daily.   SIMVASTATIN (ZOCOR) 40 MG TABLET    Take 20 mg by mouth every evening.   Modified Medications   No medications on file  Discontinued Medications   No medications on file     Review  of Systems  Constitutional: Positive for appetite change, fatigue and unexpected weight change. Negative for fever, chills, diaphoresis and activity change.  HENT: Positive for hearing loss and trouble swallowing. Negative for congestion, ear discharge, ear pain, postnasal drip, rhinorrhea, sore throat, tinnitus and voice change.        Chronic sinus congestion. Dentures.  Eyes: Negative for pain, redness, itching and visual disturbance (Corrective lenses).  Respiratory: Positive for shortness of breath (On exertion). Negative for cough, choking and wheezing.   Cardiovascular: Positive for palpitations. Negative for chest pain and leg swelling.       Hx AF  Gastrointestinal: Negative for nausea, abdominal pain, diarrhea, constipation and abdominal distention.       HX GI bleed In Sept 2015.  Endocrine: Negative for cold intolerance, heat intolerance, polydipsia, polyphagia and polyuria.       Elevated glucose 178 in Jan 2017.  Genitourinary: Negative for dysuria, urgency, frequency, hematuria, flank pain, vaginal discharge, difficulty urinating and pelvic pain.  Musculoskeletal: Positive for back pain and gait problem. Negative for myalgias, arthralgias, neck pain and neck stiffness.       Hx scoliosis and surgery of the lower back to partially correct it.  Skin: Negative for color change, pallor and rash.  Chronic pruritus the specimen back. Has multiple lesions of the back including irritated seborrheic keratoses, atrophic plaques, multiple angiomas.  Allergic/Immunologic: Negative.   Neurological: Positive for dizziness. Negative for tremors, seizures, syncope, weakness, numbness and headaches.       History of neuropathy with some numbness in the feet.  Hematological: Negative for adenopathy. Does not bruise/bleed easily.       Anemia for the last 6 months. Initially blamed on acute blood loss anemia secondary to GI bleed. Anemia has persisted since September 2015.    Psychiatric/Behavioral: Negative for suicidal ideas, hallucinations, behavioral problems, confusion, sleep disturbance, dysphoric mood and agitation. The patient is not nervous/anxious and is not hyperactive.     Filed Vitals:   04/30/14 1145  BP: 140/82  Pulse: 80  Temp: 97.5 F (36.4 C)  TempSrc: Oral  Weight: 99 lb (44.906 kg)  SpO2: 91%   Body mass index is 19.33 kg/(m^2).  Physical Exam  Constitutional: She is oriented to person, place, and time. She appears well-developed and well-nourished. No distress.  Thin, frail.  HENT:  Right Ear: External ear normal.  Left Ear: External ear normal.  Nose: Nose normal.  Mouth/Throat: Oropharynx is clear and moist. No oropharyngeal exudate.  Eyes: Conjunctivae and EOM are normal. Pupils are equal, round, and reactive to light. No scleral icterus.  Neck: No JVD present. No tracheal deviation present. No thyromegaly present.  Cardiovascular: Normal rate, normal heart sounds and intact distal pulses.  Exam reveals no gallop and no friction rub.   No murmur heard. Atrial fibrillation. Controlled rate.  Pulmonary/Chest: Effort normal. No respiratory distress. She has no wheezes. She has no rales. She exhibits no tenderness.  Abdominal: She exhibits no distension and no mass. There is no tenderness.  Musculoskeletal: Normal range of motion. She exhibits no edema or tenderness.  Scar in lumbar area secondary prior surgery. Some difficulty lying down and getting up from the exam table.  Lymphadenopathy:    She has no cervical adenopathy.  Neurological: She is alert and oriented to person, place, and time. No cranial nerve deficit. Coordination normal.  Skin: No rash noted. She is not diaphoretic. No erythema. No pallor.  Psychiatric: She has a normal mood and affect. Her behavior is normal. Judgment and thought content normal.     Labs reviewed: Nursing Home on 04/30/2014  Component Date Value Ref Range Status  . Hemoglobin 04/29/2014  11.6* 12.0 - 16.0 g/dL Final  . HCT 04/29/2014 36  36 - 46 % Final  . Platelets 04/29/2014 306  150 - 399 K/L Final  . WBC 04/29/2014 7.9   Final  Nursing Home on 04/12/2014  Component Date Value Ref Range Status  . Hemoglobin 04/05/2014 10.1* 12.0 - 16.0 g/dL Final  . HCT 04/05/2014 32* 36 - 46 % Final  . Platelets 04/05/2014 283  150 - 399 K/L Final  . WBC 04/05/2014 5.9   Final  . Glucose 04/05/2014 85   Final  . BUN 04/05/2014 16  4 - 21 mg/dL Final  . Creatinine 04/05/2014 0.8  0.5 - 1.1 mg/dL Final  . Potassium 04/05/2014 4.2  3.4 - 5.3 mmol/L Final  . Sodium 04/05/2014 138  137 - 147 mmol/L Final  . Triglycerides 04/05/2014 94  40 - 160 mg/dL Final  . Cholesterol 04/05/2014 148  0 - 200 mg/dL Final  . HDL 04/05/2014 76* 35 - 70 mg/dL Final  . LDL Cholesterol 04/05/2014 53   Final  . Alkaline Phosphatase 04/05/2014 102  25 - 125 U/L Final  . ALT 04/05/2014 9  7 - 35 U/L Final  . AST 04/05/2014 18  13 - 35 U/L Final  . Bilirubin, Total 04/05/2014 0.5   Final  . Hgb A1c MFr Bld 04/05/2014 6.6* 4.0 - 6.0 % Final  . TSH 04/05/2014 2.22  0.41 - 5.90 uIU/mL Final     Assessment/Plan  1. Iron deficiency anemia Discontinue ferrous sulfate CBC prior to next visit  2. Long term current use of anticoagulant therapy Continue current medication: Aspirin daily  3. Seborrheic keratoses, inflamed She will try castor oil daily for a month and then decide on referral to dermatology surgeon.  4. Slow transit constipation Increase fluid intake. Add Metamucil 1 tablespoon in 8 ounces of fluid daily  5. Dizziness Resolved  6. Hyperglycemia Last value normal in March 2016  7. Hypercholesterolemia Satisfactorily controlled in March 2016  8. Gastrointestinal hemorrhage, unspecified gastritis, unspecified gastrointestinal hemorrhage type Patient seems to be doing okay on omeprazole 20 mg once daily. She has questions not on it twice daily anymore and I advised her about relative  contraindications the lady her age. She seems satisfied to try to continue take it once daily but says that she feels bloated. I think this may be more due to her constipation. She will continue once daily omeprazole and call if she is not feeling better in a few weeks since we are discontinuing the ferrous sulfate.

## 2014-05-07 ENCOUNTER — Other Ambulatory Visit: Payer: Self-pay | Admitting: Cardiology

## 2014-05-09 ENCOUNTER — Other Ambulatory Visit: Payer: Self-pay | Admitting: Cardiology

## 2014-05-22 ENCOUNTER — Encounter: Payer: Self-pay | Admitting: Internal Medicine

## 2014-05-23 NOTE — Progress Notes (Unsigned)
This encounter was created in error - please disregard.  This encounter was created in error - please disregard.

## 2014-05-27 DIAGNOSIS — I240 Acute coronary thrombosis not resulting in myocardial infarction: Secondary | ICD-10-CM | POA: Diagnosis not present

## 2014-05-27 DIAGNOSIS — I482 Chronic atrial fibrillation: Secondary | ICD-10-CM | POA: Diagnosis not present

## 2014-05-27 LAB — CBC AND DIFFERENTIAL
HCT: 38 % (ref 36–46)
Hemoglobin: 12.2 g/dL (ref 12.0–16.0)
Platelets: 288 10*3/uL (ref 150–399)
WBC: 6.9 10^3/mL

## 2014-05-28 ENCOUNTER — Other Ambulatory Visit: Payer: Self-pay | Admitting: Nurse Practitioner

## 2014-05-28 ENCOUNTER — Encounter: Payer: Self-pay | Admitting: Cardiology

## 2014-05-28 ENCOUNTER — Ambulatory Visit (INDEPENDENT_AMBULATORY_CARE_PROVIDER_SITE_OTHER): Payer: Medicare Other | Admitting: Cardiology

## 2014-05-28 VITALS — BP 120/42 | HR 69 | Ht 62.0 in | Wt 101.1 lb

## 2014-05-28 DIAGNOSIS — I482 Chronic atrial fibrillation, unspecified: Secondary | ICD-10-CM

## 2014-05-28 DIAGNOSIS — E7800 Pure hypercholesterolemia, unspecified: Secondary | ICD-10-CM

## 2014-05-28 DIAGNOSIS — D62 Acute posthemorrhagic anemia: Secondary | ICD-10-CM

## 2014-05-28 DIAGNOSIS — I6522 Occlusion and stenosis of left carotid artery: Secondary | ICD-10-CM | POA: Diagnosis not present

## 2014-05-28 NOTE — Progress Notes (Signed)
Cardiology Office Note   Date:  05/28/2014   ID:  Vanessa [name], DOB 04-05-1920, MRN [id number]  PCP:  Estill Dooms, MD  Cardiologist: [name] MD  No chief complaint on file.     History of Present Illness: Vanessa [name] is a 81 y.o. female who presents for scheduled follow-up office visit  This pleasant 81 year old widowed woman is seen for a scheduled four-month followup office visit. She has a history of atypical chest pain. She was seen in March 2013 with these symptoms and underwent a nuclear stress test subsequently which showed no ischemia. The patient reports that her symptoms have improved. She recently saw Dr. Cristina Gong who placed her on Nexium for dysphagia. She reports improvement in those symptoms since starting on Nexium.  The Nexium was subsequently switched to omeprazole. She has a history of chronic established atrial fibrillation. He previously had been on Coumadin for many years. However she has had several devastating falls. He had another fall this summer and was hospitalized and then transferred to skilled nursing facility for rehabilitation. As a result of that fall she broke 5 ribs. The fall occurred when she just lost her balance. She has a history of vascular disease with known chronic occlusion of her left carotid artery. She has a past history of high blood pressure.  Her CHADSSVASC score is 5 (for age 32, female sex, high blood pressure, and vascular disease with occluded left internal carotid artery). However she is no longer on warfarin because of her falls. She has a known left carotid artery chronic occlusion. She is followed for vascular disease by Dr. Gae Gallop. Since we last saw her she has moved to a [facility] [place] [facility].  She still drives her car back to her other home each day to pick up her mail and check on her other home.  She takes her meals at [facility] [place]. Since last visit patient has  not had any severe chest discomfort.  Has had a lot of problems with skin irritation and has seen 3 different dermatologists.  She has also seen Dr. Ernesto Rutherford about her sinuses and Dr. Cristina Gong  checks her about her GI tract and recently decreased her omeprazole to just one a day Past Medical History  Diagnosis Date  . Atrial fibrillation   . Neuropathy     PERIPHERAL  . Carotid artery occlusion     LEFT  . Scoliosis   . Hoarseness   . Headache(784.0)   . Dizziness   . Abdominal bloating   . Bruises easily   . Pruritus   . Hypercholesterolemia   . Fall at home Sept 2013, Dec. 2013  Jun 08, 2012  . Varicose veins     Past Surgical History  Procedure Laterality Date  . Abdominal hysterectomy  1954  . Spine surgery  1997    correct scoliosis  . Cholecystectomy  1997     Current Outpatient Prescriptions  Medication Sig Dispense Refill  . ALPRAZolam (XANAX) 0.25 MG tablet Take 0.25 mg by mouth at bedtime as needed for anxiety.    Marland Kitchen aspirin 81 MG chewable tablet Chew 81 mg by mouth daily.    Marland Kitchen atenolol (TENORMIN) 25 MG tablet TAKE 1 TABLET TWICE DAILY. 60 tablet 6  . calcium carbonate 200 MG capsule Take 250 mg by mouth.    . docusate sodium (COLACE) 100 MG capsule Take 100 mg by mouth. As needed for constipation    .  Multiple Vitamin (MULTIVITAMIN) tablet Take 1 tablet by mouth daily.    Marland Kitchen omeprazole (PRILOSEC) 20 MG capsule Take 20 mg by mouth daily.    . simvastatin (ZOCOR) 40 MG tablet Take 20 mg by mouth every evening.      No current facility-administered medications for this visit.    Allergies:   Tape    Social History:  The patient  reports that she has never smoked. She has never used smokeless tobacco. She reports that she does not drink alcohol or use illicit drugs.   Family History:  The patient's family history includes Cancer in her sister; Diabetes in her mother; Heart attack in her father and mother; Heart disease in her maternal grandmother, mother, and  another family member; Stroke in her father.    ROS:  Please see the history of present illness.   Otherwise, review of systems are positive for none.   All other systems are reviewed and negative.    PHYSICAL EXAM: VS:  BP 120/42 mmHg  Pulse 69  Ht 5\' 2"  (1.575 m)  Wt 101 lb 1.9 oz (45.868 kg)  BMI 18.49 kg/m2 , BMI Body mass index is 18.49 kg/(m^2). GEN: Well nourished, well developed, in no acute distress HEENT: normal Neck: no JVD, carotid bruits, or masses Cardiac: Irregularly irregular.;  Soft systolic murmur at base. No rubs, or gallops,no edema  Respiratory:  clear to auscultation bilaterally, normal work of breathing GI: soft, nontender, nondistended, + BS MS: no deformity or atrophy Skin: warm and dry, no rash Neuro:  Strength and sensation are intact Psych: euthymic mood, full affect   EKG:  EKG is ordered today. The ekg ordered today demonstrates atrial fibrillation with controlled ventricular response.  Heart rate is 69 bpm.  No ischemic changes.    Recent Labs: 09/13/2013: Magnesium 1.9 11/16/2013: Pro B Natriuretic peptide (BNP) 878.2* 04/05/2014: ALT 9; BUN 16; Creatinine 0.8; Potassium 4.2; Sodium 138; TSH 2.22 05/27/2014: Hemoglobin 12.2; Platelets 288    Lipid Panel    Component Value Date/Time   CHOL 148 04/05/2014   TRIG 94 04/05/2014   HDL 76* 04/05/2014   CHOLHDL 3 07/09/2013 0849   VLDL 25.2 07/09/2013 0849   LDLCALC 53 04/05/2014      Wt Readings from Last 3 Encounters:  05/28/14 101 lb 1.9 oz (45.868 kg)  04/30/14 99 lb (44.906 kg)  04/02/14 98 lb 8 oz (44.679 kg)         ASSESSMENT AND PLAN:  1. Permanent atrial fibrillation. Chadsvasc score is 5. Not an anticoagulation candidate because of repeated severe falls. Continue baby aspirin daily 2. known left carotid artery occlusion. 3. Hypercholesterolemia. 4. Essential hypertension without heart failure 5. Osteoarthritis  Continue same medication. Recheck in 4 months for  follow-up office visit. Dr. Jeanmarie Hubert is now her new PCP at [facility] [place]   Current medicines are reviewed at length with the patient today.  The patient does not have concerns regarding medicines.  The following changes have been made:  no change  Labs/ tests ordered today include:   Orders Placed This Encounter  Procedures  . EKG 12-Lead       Signed, [name] MD 05/28/2014 5:12 PM    [facility] [address], [place], [place]  [address] Phone: [phone]; Fax: [phone]

## 2014-05-28 NOTE — Patient Instructions (Signed)
Medication Instructions:  Your physician recommends that you continue on your current medications as directed. Please refer to the Current Medication list given to you today.  Labwork: none  Testing/Procedures: none  Follow-Up: Your physician recommends that you schedule a follow-up appointment in: 4 month ov   

## 2014-06-04 ENCOUNTER — Telehealth: Payer: Self-pay | Admitting: *Deleted

## 2014-06-04 NOTE — Progress Notes (Unsigned)
This encounter was created in error - please disregard.

## 2014-06-04 NOTE — Telephone Encounter (Signed)
Vanessa [name] states she experienced an intermittent "electrical shock feeling" in her left leg 06-03-2014 (evening hours) into the early morning hours of 06-04-2014.  She said she has not experienced the sensation today but is very concerned and would like to be seen and evaluated.  She denies left leg pain or swelling.  She is not currently wearing compression hose as she has difficulty putting them on.  Recommended that she have caregiver at the facility where she lives to put compression hose on her and keep her left leg elevated.  Appointment made with Cresenciano Lick NP on 06-05-2014 at 10:30 AM.  [name] appears reassured and states she will be able to come to [facility] for the appointment tomorrow.

## 2014-06-05 ENCOUNTER — Ambulatory Visit (INDEPENDENT_AMBULATORY_CARE_PROVIDER_SITE_OTHER): Payer: Medicare Other | Admitting: Family

## 2014-06-05 ENCOUNTER — Encounter: Payer: Self-pay | Admitting: Family

## 2014-06-05 VITALS — BP 140/62 | HR 45 | Temp 97.7°F | Resp 12 | Ht 62.0 in | Wt 99.0 lb

## 2014-06-05 DIAGNOSIS — I6521 Occlusion and stenosis of right carotid artery: Secondary | ICD-10-CM | POA: Diagnosis not present

## 2014-06-05 DIAGNOSIS — I6522 Occlusion and stenosis of left carotid artery: Secondary | ICD-10-CM | POA: Diagnosis not present

## 2014-06-05 DIAGNOSIS — R202 Paresthesia of skin: Secondary | ICD-10-CM | POA: Insufficient documentation

## 2014-06-05 DIAGNOSIS — I8393 Asymptomatic varicose veins of bilateral lower extremities: Secondary | ICD-10-CM

## 2014-06-05 DIAGNOSIS — M25473 Effusion, unspecified ankle: Secondary | ICD-10-CM | POA: Insufficient documentation

## 2014-06-05 NOTE — Progress Notes (Signed)
Established Carotid Patient   History of Present Illness  Vanessa [name] is a 81 y.o. female  patient that Dr. Scot Dock has been monitoring for carotid artery stenosis; she has a known left internal carotid artery occlusion and minimal right ICA stenosis. She returns today with c/o one episode of feeling a transient shock feeling in her left lower leg while lying in bed, did not happen again. Has lower legs varicosities with no edema. She spoke with our vein clinic nurse in the past re her varicosities and was given a pair of compression hose, but cannot get them on due to the weakness in her wrists. Vanessa [name] spoke with pt again today and discussed donning compression hose.  Patient has not had previous carotid artery intervention.  Patient has Negative history of TIA or stroke symptom. The patient denies amaurosis fugax or monocular blindness. The patient denies facial drooping. Pt. denies hemiplegia. The patient denies receptive or expressive aphasia.   Pt reports New Medical or Surgical History: development of extreme arthritis in her neck, finished taking prednisone for this.  The hematoma on her left lower leg has healed.   Pt Diabetic: No Pt smoker: non-smoker  Pt meds include: Statin : Yes ASA: No Other anticoagulants/antiplatelets: coumadin for atrial fib   Past Medical History  Diagnosis Date  . Atrial fibrillation   . Neuropathy     PERIPHERAL  . Carotid artery occlusion     LEFT  . Scoliosis   . Hoarseness   . Headache(784.0)   . Dizziness   . Abdominal bloating   . Bruises easily   . Pruritus   . Hypercholesterolemia   . Fall at home Sept 2013, Dec. 2013  Jun 08, 2012  . Varicose veins     Social History History  Substance Use Topics  . Smoking status: Never Smoker   . Smokeless tobacco: Never Used  . Alcohol Use: No    Family History Family History  Problem Relation Age of Onset  . Heart disease    . Diabetes Mother   . Heart attack  Mother   . Heart disease Mother     After age 41  . Heart attack Father   . Stroke Father   . Cancer Sister     Breast  . Heart disease Maternal Grandmother     Surgical History Past Surgical History  Procedure Laterality Date  . Abdominal hysterectomy  1954  . Spine surgery  1997    correct scoliosis  . Cholecystectomy  1997    Allergies  Allergen Reactions  . Tape Other (See Comments)    Tears skin. Band-Aids    Current Outpatient Prescriptions  Medication Sig Dispense Refill  . ALPRAZolam (XANAX) 0.25 MG tablet Take 0.25 mg by mouth at bedtime as needed for anxiety.    Marland Kitchen aspirin 81 MG chewable tablet Chew 81 mg by mouth daily.    Marland Kitchen atenolol (TENORMIN) 25 MG tablet TAKE 1 TABLET TWICE DAILY. 60 tablet 6  . BIOTIN 5000 PO Take by mouth daily.    . calcium carbonate 200 MG capsule Take 250 mg by mouth.    . docusate sodium (COLACE) 100 MG capsule Take 100 mg by mouth. As needed for constipation    . Multiple Vitamin (MULTIVITAMIN) tablet Take 1 tablet by mouth daily.    Marland Kitchen omeprazole (PRILOSEC) 20 MG capsule Take 20 mg by mouth daily.    . simvastatin (ZOCOR) 40 MG tablet Take 20 mg by mouth every evening.  No current facility-administered medications for this visit.    Review of Systems : See HPI for pertinent positives and negatives.  Physical Examination  Filed Vitals:   06/05/14 1056  BP: 140/62  Pulse: 45  Temp: 97.7 F (36.5 C)  Resp: 12  Height: 5\' 2"  (1.575 m)  Weight: 99 lb (44.906 kg)   Body mass index is 18.1 kg/(m^2).  General: WDWN elderly female in NAD GAIT: normal Eyes: PERRLA Pulmonary: Non-labored, CTAB, Negative Rales, Negative rhonchi, & Negative wheezing.  Cardiac: irregular Rhythm , Negative detected murmur.  VASCULAR EXAM Carotid Bruits Right Left   Negative Negative   Radial pulses are 2+ palpable and equal.       LE Pulses Right Left   POPLITEAL not palpable  not palpable   POSTERIOR TIBIAL 1+ palpable  2+ palpable    DORSALIS PEDIS  ANTERIOR TIBIAL 2+ palpable  2+ palpable     Gastrointestinal: soft, nontender, BS WNL, no r/g, no palpable masses.  Musculoskeletal: Negative muscle atrophy/wasting. M/S 4/5 throughout, Extremities without ischemic changes, varicosities in both lower legs.  Neurologic: A&O X 3; Appropriate Affect, Speech is normal CN 2-12 intact, mild hearing loss, Pain and light touch intact in extremities, Motor exam as listed above.          Assessment: Vanessa [name] is a 81 y.o. female who has a known left internal carotid artery occlusion and minimal right ICA stenosis. She returns today with c/o one episode of feeling a transient shock feeling in her left lower leg while lying in bed, did not happen again. Has lower legs varicosities with no edema. She spoke with our vein clinic nurse in the past re her varicosities and was given a pair of compression hose, but cannot get them on due to the weakness in her wrists. Vanessa [name] spoke with pt again today and discussed donning compression hose. Since pt spends the night in an assisted living facility, and is elsewhere during the day, the staff at the facility will assist pt to donn the compression hose in the mornings and remove at bedtime. Pt was reassured that the one time feeling of shock that she had in her leg was not due to poor arterial perfusion nor venous ulcers as she has no venous ulcers, no open wounds on her lower legs; her pedal pulses are strongly palpable.   Plan: Follow-up in August 2016 as already scheduled.   I discussed in depth with the patient the nature of atherosclerosis, and emphasized the importance of maximal medical management including strict control of blood  pressure, blood glucose, and lipid levels, obtaining regular exercise, and continued cessation of smoking.  The patient is aware that without maximal medical management the underlying atherosclerotic disease process will progress, limiting the benefit of any interventions. The patient was given information about stroke prevention and what symptoms should prompt the patient to seek immediate medical care. Thank you for allowing [place] to participate in this patient's care.  Clemon Chambers, RN, MSN, FNP-C [facility] Office: [phone]  Clinic Physician: Scot Dock  06/05/2014 11:32 AM

## 2014-06-12 DIAGNOSIS — R490 Dysphonia: Secondary | ICD-10-CM | POA: Diagnosis not present

## 2014-06-12 DIAGNOSIS — J310 Chronic rhinitis: Secondary | ICD-10-CM | POA: Diagnosis not present

## 2014-06-12 DIAGNOSIS — H903 Sensorineural hearing loss, bilateral: Secondary | ICD-10-CM | POA: Diagnosis not present

## 2014-06-12 DIAGNOSIS — R1312 Dysphagia, oropharyngeal phase: Secondary | ICD-10-CM | POA: Diagnosis not present

## 2014-06-18 ENCOUNTER — Encounter: Payer: Self-pay | Admitting: Internal Medicine

## 2014-06-18 ENCOUNTER — Non-Acute Institutional Stay: Payer: Medicare Other | Admitting: Internal Medicine

## 2014-06-18 VITALS — BP 100/62 | HR 60 | Wt 99.0 lb

## 2014-06-18 DIAGNOSIS — L03011 Cellulitis of right finger: Secondary | ICD-10-CM | POA: Diagnosis not present

## 2014-06-18 DIAGNOSIS — M25472 Effusion, left ankle: Secondary | ICD-10-CM

## 2014-06-18 DIAGNOSIS — R682 Dry mouth, unspecified: Secondary | ICD-10-CM

## 2014-06-18 DIAGNOSIS — L299 Pruritus, unspecified: Secondary | ICD-10-CM

## 2014-06-18 DIAGNOSIS — I8390 Asymptomatic varicose veins of unspecified lower extremity: Secondary | ICD-10-CM

## 2014-06-18 DIAGNOSIS — IMO0002 Reserved for concepts with insufficient information to code with codable children: Secondary | ICD-10-CM | POA: Insufficient documentation

## 2014-06-18 DIAGNOSIS — I6522 Occlusion and stenosis of left carotid artery: Secondary | ICD-10-CM

## 2014-06-18 DIAGNOSIS — K5901 Slow transit constipation: Secondary | ICD-10-CM | POA: Diagnosis not present

## 2014-06-18 DIAGNOSIS — K117 Disturbances of salivary secretion: Secondary | ICD-10-CM

## 2014-06-18 DIAGNOSIS — I868 Varicose veins of other specified sites: Secondary | ICD-10-CM

## 2014-06-18 MED ORDER — PRAMOXINE-HC 1-1 % EX LOTN: TOPICAL_LOTION | CUTANEOUS | Status: DC

## 2014-06-18 NOTE — Progress Notes (Signed)
Patient ID: Vanessa [name], female   DOB: [birth year], 81 y.o.   MRN: [id number]    [facility] Room Number: AL 01  Place of Service: Clinic (12)     Allergies  Allergen Reactions  . Tape Other (See Comments)    Tears skin. Band-Aids    Chief Complaint  Patient presents with  . Medical Management of Chronic Issues    anemia, constipation  . Edema    left ankle at night  . Recurrent Skin Infections    ring finger right hand, gets infected off and on  . dry mouth  . Pruritis    on back been to 3 dermatiologist -no help  . toes    feel swollen and numb, but they aren't   . Varicose Veins    veins in legs large, concern her, told to wear Ted hose by vascular doctor, only wore them 2 days    HPI:  Pruritus: Worse on the back. Has been a problem for many years. Currently using 1% hydrocortisone in Aveeno cream.  Varicose veins: Bilateral and was quite large veins at times. Denies pain.  Paronychia, right: Right third finger. Healing.  Xerostomia: Over-the-counter saliva substitutes have not been helpful.  Slow transit constipation: Resolved  Swelling of ankle, left: As a result of her varicose veins and upright position during the day. No swelling in the morning.    Medications: Patient's Medications  New Prescriptions   No medications on file  Previous Medications   ALPRAZOLAM (XANAX) 0.25 MG TABLET    Take 0.25 mg by mouth at bedtime as needed for anxiety.   ASPIRIN 81 MG CHEWABLE TABLET    Chew 81 mg by mouth daily.   ATENOLOL (TENORMIN) 25 MG TABLET    TAKE 1 TABLET TWICE DAILY.   CALCIUM-VITAMIN D-VITAMIN K [id number] MG-UNT-MCG CHEW    Chew by mouth. One at bedtime for calcium   DOCUSATE SODIUM (COLACE) 100 MG CAPSULE    Take 100 mg by mouth. As needed for constipation   MULTIPLE VITAMIN (MULTIVITAMIN) TABLET    Take 1 tablet by mouth daily.   OMEPRAZOLE (PRILOSEC) 20 MG CAPSULE    Take 20 mg by mouth daily.   PSYLLIUM  (METAMUCIL PO)    Take by mouth. Take one tablespoon with 8 oz of water daily for constipation   SIMVASTATIN (ZOCOR) 40 MG TABLET    Take 20 mg by mouth every evening.   Modified Medications   No medications on file  Discontinued Medications   BIOTIN 5000 PO    Take by mouth daily.   CALCIUM CARBONATE 200 MG CAPSULE    Take 250 mg by mouth.     Review of Systems  Constitutional: Positive for appetite change, fatigue and unexpected weight change. Negative for fever, chills, diaphoresis and activity change.  HENT: Positive for hearing loss and trouble swallowing. Negative for congestion, ear discharge, ear pain, postnasal drip, rhinorrhea, sore throat, tinnitus and voice change.        Chronic sinus congestion. Dentures. Xerostomia.  Eyes: Negative for pain, redness, itching and visual disturbance (Corrective lenses).  Respiratory: Positive for shortness of breath (On exertion). Negative for cough, choking and wheezing.   Cardiovascular: Positive for palpitations. Negative for chest pain and leg swelling.       Hx AF  Gastrointestinal: Negative for nausea, abdominal pain, diarrhea, constipation and abdominal distention.       HX GI bleed In Sept 2015.  Endocrine: Negative  for cold intolerance, heat intolerance, polydipsia, polyphagia and polyuria.       Elevated glucose 178 in Jan 2017.  Genitourinary: Negative for dysuria, urgency, frequency, hematuria, flank pain, vaginal discharge, difficulty urinating and pelvic pain.  Musculoskeletal: Positive for back pain and gait problem. Negative for myalgias, arthralgias, neck pain and neck stiffness.       Hx scoliosis and surgery of the lower back to partially correct it.  Skin: Negative for color change, pallor and rash.       Chronic pruritus the specimen back. Has multiple lesions of the back including irritated seborrheic keratoses, atrophic plaques, multiple angiomas. Healing pattern issue of the right third finger.  Allergic/Immunologic:  Negative.   Neurological: Positive for dizziness. Negative for tremors, seizures, syncope, weakness, numbness and headaches.       History of neuropathy with some numbness in the feet.  Hematological: Negative for adenopathy. Does not bruise/bleed easily.       Anemia for the last 6 months. Initially blamed on acute blood loss anemia secondary to GI bleed. Anemia has persisted since September 2015.  Psychiatric/Behavioral: Negative for suicidal ideas, hallucinations, behavioral problems, confusion, sleep disturbance, dysphoric mood and agitation. The patient is not nervous/anxious and is not hyperactive.     Filed Vitals:   06/18/14 1022  BP: 100/62  Pulse: 60  Weight: 99 lb (44.906 kg)  SpO2: 91%   Body mass index is 18.1 kg/(m^2).  Physical Exam  Constitutional: She is oriented to person, place, and time. She appears well-developed and well-nourished. No distress.  Thin, frail.  HENT:  Right Ear: External ear normal.  Left Ear: External ear normal.  Nose: Nose normal.  Mouth/Throat: Oropharynx is clear and moist. No oropharyngeal exudate.  Eyes: Conjunctivae and EOM are normal. Pupils are equal, round, and reactive to light. No scleral icterus.  Neck: No JVD present. No tracheal deviation present. No thyromegaly present.  Cardiovascular: Normal rate, normal heart sounds and intact distal pulses.  Exam reveals no gallop and no friction rub.   No murmur heard. Atrial fibrillation. Controlled rate.  Pulmonary/Chest: Effort normal. No respiratory distress. She has no wheezes. She has no rales. She exhibits no tenderness.  Abdominal: She exhibits no distension and no mass. There is no tenderness.  Musculoskeletal: Normal range of motion. She exhibits no edema or tenderness.  Scar in lumbar area secondary prior surgery. Some difficulty lying down and getting up from the exam table.  Lymphadenopathy:    She has no cervical adenopathy.  Neurological: She is alert and oriented to person,  place, and time. No cranial nerve deficit. Coordination normal.  Skin: No rash noted. She is not diaphoretic. No erythema. No pallor.  Healing paronychia at the right third finger. Multiple seborrheic keratoses the temple and on the back.  Psychiatric: She has a normal mood and affect. Her behavior is normal. Judgment and thought content normal.     Labs reviewed: Lab on 05/28/2014  Component Date Value Ref Range Status  . Hemoglobin 05/27/2014 12.2  12.0 - 16.0 g/dL Final  . HCT 05/27/2014 38  36 - 46 % Final  . Platelets 05/27/2014 288  150 - 399 K/L Final  . WBC 05/27/2014 6.9   Final  Nursing Home on 04/30/2014  Component Date Value Ref Range Status  . Hemoglobin 04/29/2014 11.6* 12.0 - 16.0 g/dL Final  . HCT 04/29/2014 36  36 - 46 % Final  . Platelets 04/29/2014 306  150 - 399 K/L Final  . WBC  04/29/2014 7.9   Final  Nursing Home on 04/12/2014  Component Date Value Ref Range Status  . Hemoglobin 04/05/2014 10.1* 12.0 - 16.0 g/dL Final  . HCT 04/05/2014 32* 36 - 46 % Final  . Platelets 04/05/2014 283  150 - 399 K/L Final  . WBC 04/05/2014 5.9   Final  . Glucose 04/05/2014 85   Final  . BUN 04/05/2014 16  4 - 21 mg/dL Final  . Creatinine 04/05/2014 0.8  0.5 - 1.1 mg/dL Final  . Potassium 04/05/2014 4.2  3.4 - 5.3 mmol/L Final  . Sodium 04/05/2014 138  137 - 147 mmol/L Final  . Triglycerides 04/05/2014 94  40 - 160 mg/dL Final  . Cholesterol 04/05/2014 148  0 - 200 mg/dL Final  . HDL 04/05/2014 76* 35 - 70 mg/dL Final  . LDL Cholesterol 04/05/2014 53   Final  . Alkaline Phosphatase 04/05/2014 102  25 - 125 U/L Final  . ALT 04/05/2014 9  7 - 35 U/L Final  . AST 04/05/2014 18  13 - 35 U/L Final  . Bilirubin, Total 04/05/2014 0.5   Final  . Hgb A1c MFr Bld 04/05/2014 6.6* 4.0 - 6.0 % Final  . TSH 04/05/2014 2.22  0.41 - 5.90 uIU/mL Final     Assessment/Plan  1. Pruritus - pramoxine-hydrocortisone 1-1 % lotion; Apply to areas that itch up to twice daily  Dispense: 118  mL; Refill: 5  2. Varicose veins Use light to medium weight compression stockings  3. Paronychia, right Used liquid soap to 3 times daily  4. Xerostomia Advised patient this was going to persist  5. Slow transit constipation - Psyllium (METAMUCIL PO); Take by mouth. Take one tablespoon with 8 oz of water daily for constipation  6. Swelling of ankle, left Due to varicose veins. Use compression stockings.

## 2014-07-12 ENCOUNTER — Encounter: Payer: Self-pay | Admitting: Nurse Practitioner

## 2014-07-12 ENCOUNTER — Non-Acute Institutional Stay: Payer: Medicare Other | Admitting: Nurse Practitioner

## 2014-07-12 DIAGNOSIS — I482 Chronic atrial fibrillation, unspecified: Secondary | ICD-10-CM

## 2014-07-12 DIAGNOSIS — R42 Dizziness and giddiness: Secondary | ICD-10-CM | POA: Diagnosis not present

## 2014-07-12 DIAGNOSIS — I10 Essential (primary) hypertension: Secondary | ICD-10-CM | POA: Diagnosis not present

## 2014-07-12 DIAGNOSIS — R1314 Dysphagia, pharyngoesophageal phase: Secondary | ICD-10-CM | POA: Diagnosis not present

## 2014-07-12 DIAGNOSIS — F411 Generalized anxiety disorder: Secondary | ICD-10-CM

## 2014-07-12 NOTE — Assessment & Plan Note (Signed)
Heart rate is in control, takes Atenolol 25mg  bid. Update EKG

## 2014-07-12 NOTE — Assessment & Plan Note (Signed)
Not new, last episode was 2-3 days ago, denied change in vision, HA, palpitation, focal weakness, nausea, vomiting, urinary frequency, or constipation associated with the event. Will update CBC, CMP, TSH, UA C/S, and EKG. May consider CT head or carotid doppler or neurology if recurs.

## 2014-07-12 NOTE — Progress Notes (Signed)
Patient ID: Vanessa [name], female   DOB: 07-26-1920, 81 y.o.   MRN: [id number]   Code Status: DNR  Allergies  Allergen Reactions  . Tape Other (See Comments)    Tears skin. Band-Aids    Chief Complaint  Patient presents with  . Medical Management of Chronic Issues  . Acute Visit    dizziness episode    HPI: Patient is a 81 y.o. female seen in the [facility] at [facility] today for  evaluation of dizziness episode and chronic medical conditions Problem List Items Addressed This Visit    Atrial fibrillation    Heart rate is in control, takes Atenolol 25mg  bid. Update EKG       Dizziness - Primary    Not new, last episode was 2-3 days ago, denied change in vision, HA, palpitation, focal weakness, nausea, vomiting, urinary frequency, or constipation associated with the event. Will update CBC, CMP, TSH, UA C/S, and EKG. May consider CT head or carotid doppler or neurology if recurs.       Anxiety state    Daily prn Xanax 0.25mg  is adequate for her.       Dysphagia, pharyngoesophageal phase    Pending barium swallow study, saw ENT         Review of Systems:  Review of Systems  Constitutional: Positive for fatigue and unexpected weight change. Negative for fever, chills, diaphoresis, activity change and appetite change.  HENT: Positive for hearing loss and trouble swallowing. Negative for congestion, ear discharge, ear pain, postnasal drip, rhinorrhea, sore throat, tinnitus and voice change.        Chronic sinus congestion. Dentures.  Eyes: Negative for pain, redness, itching and visual disturbance (Corrective lenses).  Respiratory: Positive for shortness of breath (On exertion). Negative for cough, choking and wheezing.   Cardiovascular: Positive for palpitations. Negative for chest pain and leg swelling.       Hx AF  Gastrointestinal: Negative for nausea, abdominal pain, diarrhea, constipation and abdominal distention.       HX GI bleed In Sept 2015.  Endocrine:  Negative for cold intolerance, heat intolerance, polydipsia, polyphagia and polyuria.       Elevated glucose 178 in Jan 2017.  Genitourinary: Negative for dysuria, urgency, frequency, hematuria, flank pain, vaginal discharge, difficulty urinating and pelvic pain.  Musculoskeletal: Positive for back pain and gait problem. Negative for myalgias, arthralgias, neck pain and neck stiffness.       Hx scoliosis and surgery of the lower back to partially correct it.  Skin: Negative for color change, pallor and rash.       Chronic pruritus the specimen back. Has multiple lesions of the back including irritated seborrheic keratoses, atrophic plaques, multiple angiomas.  Allergic/Immunologic: Negative.   Neurological: Positive for dizziness. Negative for tremors, seizures, syncope, weakness, numbness and headaches.       History of neuropathy with some numbness in the feet.  Hematological: Negative for adenopathy. Does not bruise/bleed easily.       Anemia for the last 6 months. Initially blamed on acute blood loss anemia secondary to GI bleed. Anemia has persisted since September 2015.  Psychiatric/Behavioral: Negative for suicidal ideas, hallucinations, behavioral problems, confusion, sleep disturbance, dysphoric mood and agitation. The patient is not nervous/anxious and is not hyperactive.      Past Medical History  Diagnosis Date  . Atrial fibrillation   . Neuropathy     PERIPHERAL  . Carotid artery occlusion     LEFT  . Scoliosis   .  Hoarseness   . Headache(784.0)   . Dizziness   . Abdominal bloating   . Bruises easily   . Pruritus   . Hypercholesterolemia   . Fall at home Sept 2013, Dec. 2013  Jun 08, 2012  . Varicose veins    Past Surgical History  Procedure Laterality Date  . Abdominal hysterectomy  1954  . Spine surgery  1997    correct scoliosis  . Cholecystectomy  1997   Social History:   reports that she has never smoked. She has never used smokeless tobacco. She reports  that she does not drink alcohol or use illicit drugs.  Family History  Problem Relation Age of Onset  . Heart disease    . Diabetes Mother   . Heart attack Mother   . Heart disease Mother     After age 36  . Heart attack Father   . Stroke Father   . Cancer Sister     Breast  . Heart disease Maternal Grandmother     Medications: Patient's Medications  New Prescriptions   No medications on file  Previous Medications   ALPRAZOLAM (XANAX) 0.25 MG TABLET    Take 0.25 mg by mouth at bedtime as needed for anxiety.   ASPIRIN 81 MG CHEWABLE TABLET    Chew 81 mg by mouth daily.   ATENOLOL (TENORMIN) 25 MG TABLET    TAKE 1 TABLET TWICE DAILY.   CALCIUM-VITAMIN D-VITAMIN K 235-573-22 MG-UNT-MCG CHEW    Chew by mouth. One at bedtime for calcium   DOCUSATE SODIUM (COLACE) 100 MG CAPSULE    Take 100 mg by mouth. As needed for constipation   MULTIPLE VITAMIN (MULTIVITAMIN) TABLET    Take 1 tablet by mouth daily.   OMEPRAZOLE (PRILOSEC) 20 MG CAPSULE    Take 20 mg by mouth daily.   PRAMOXINE-HYDROCORTISONE 1-1 % LOTION    Apply to areas that itch up to twice daily   PSYLLIUM (METAMUCIL PO)    Take by mouth. Take one tablespoon with 8 oz of water daily for constipation   SIMVASTATIN (ZOCOR) 40 MG TABLET    Take 20 mg by mouth every evening.   Modified Medications   No medications on file  Discontinued Medications   No medications on file     Physical Exam: Physical Exam  Constitutional: She is oriented to person, place, and time. She appears well-developed and well-nourished. No distress.  Thin, frail.  HENT:  Right Ear: External ear normal.  Left Ear: External ear normal.  Nose: Nose normal.  Mouth/Throat: Oropharynx is clear and moist. No oropharyngeal exudate.  Eyes: Conjunctivae and EOM are normal. Pupils are equal, round, and reactive to light. No scleral icterus.  Neck: No JVD present. No tracheal deviation present. No thyromegaly present.  Cardiovascular: Normal rate, normal  heart sounds and intact distal pulses.  Exam reveals no gallop and no friction rub.   No murmur heard. Atrial fibrillation. Controlled rate.  Pulmonary/Chest: Effort normal. No respiratory distress. She has no wheezes. She has no rales. She exhibits no tenderness.  Abdominal: She exhibits no distension and no mass. There is no tenderness.  Musculoskeletal: Normal range of motion. She exhibits no edema or tenderness.  Scar in lumbar area secondary prior surgery. Some difficulty lying down and getting up from the exam table.  Lymphadenopathy:    She has no cervical adenopathy.  Neurological: She is alert and oriented to person, place, and time. No cranial nerve deficit. Coordination normal.  Skin: No rash  noted. She is not diaphoretic. No erythema. No pallor.  Psychiatric: She has a normal mood and affect. Her behavior is normal. Judgment and thought content normal.    Filed Vitals:   07/12/14 1252  BP: 120/72  Pulse: 62  Temp: 97.5 F (36.4 C)  TempSrc: Tympanic  Resp: 18      Labs reviewed: Basic Metabolic Panel:  Recent Labs  09/11/13 0500 09/12/13 0530 09/13/13 0600 11/16/13 1754 01/22/14 1632 04/05/14  NA 142 144 142 139 135 138  K 4.0 3.7 3.6* 4.1 3.9 4.2  CL 106 105 105 99 103  --   CO2 27 30 28 26 27   --   GLUCOSE 98 90 109* 94 178*  --   BUN 30* 33* 25* 13 15 16   CREATININE 0.75 0.90 0.78 0.86 0.76 0.8  CALCIUM 8.5 8.6 8.7 10.0 8.8  --   MG 1.9 2.0 1.9  --   --   --   PHOS 2.6 3.2 3.1  --   --   --   TSH  --   --   --   --   --  2.22   Liver Function Tests:  Recent Labs  09/10/13 0449 04/05/14  AST 19 18  ALT 15 9  ALKPHOS 105 102  BILITOT 0.3  --   PROT 6.0  --   ALBUMIN 2.4*  --    No results for input(s): LIPASE, AMYLASE in the last 8760 hours. No results for input(s): AMMONIA in the last 8760 hours. CBC:  Recent Labs  09/11/13 0500  09/12/13 0530  09/13/13 0600 11/16/13 1754 01/22/14 1632 04/05/14 04/29/14 05/27/14  WBC 13.0*  --   13.9*  --  11.7* 9.7 8.8 5.9 7.9 6.9  NEUTROABS 9.5*  --  10.4*  --  9.1*  --   --   --   --   --   HGB 9.6*  < > 9.1*  < > 8.6* 11.4* 10.7* 10.1* 11.6* 12.2  HCT 28.3*  < > 26.6*  < > 25.5* 36.5 34.8* 32* 36 38  MCV 91.9  --  90.2  --  92.1 91.5 87.9  --   --   --   PLT 258  --  309  --  293 298 366 283 306 288  < > = values in this interval not displayed. Lipid Panel:  Recent Labs  04/05/14  CHOL 148  HDL 76*  LDLCALC 53  TRIG 94     Past Procedures:  09/10/13 CT abd and pelvis with contrast:   IMPRESSION: 1. No acute abdominal or pelvic findings. No explanation for elevated white blood cell count. 2. Bilateral moderate to large pleural effusions with associated atelectasis. 3. Posterior left rib fractures. 4. Large benign appearing cyst associated with the right ovary. In late postmenopausal female, recommend abdominal ultrasound further evaluation.   09/11/14 echocardiogram: EF 60-65%  01/22/14 CT head wo contrast:   IMPRESSION: 1. No findings to explain the patient's intermittent dizziness. 2. Periventricular white matter and corona radiata hypodensities favor chronic ischemic microvascular white matter disease.  Assessment/Plan Dizziness Not new, last episode was 2-3 days ago, denied change in vision, HA, palpitation, focal weakness, nausea, vomiting, urinary frequency, or constipation associated with the event. Will update CBC, CMP, TSH, UA C/S, and EKG. May consider CT head or carotid doppler or neurology if recurs.   Atrial fibrillation Heart rate is in control, takes Atenolol 25mg  bid. Update EKG   Anxiety state Daily prn Xanax  0.25mg  is adequate for her.   Dysphagia, pharyngoesophageal phase Pending barium swallow study, saw ENT    Family/ Staff Communication: observe the patient.   Goals of Care: AL  Labs/tests ordered: CBC, CMP, TSH, UA C/S, EKG.

## 2014-07-12 NOTE — Assessment & Plan Note (Signed)
Pending barium swallow study, saw ENT

## 2014-07-12 NOTE — Assessment & Plan Note (Signed)
Daily prn Xanax 0.25mg  is adequate for her.

## 2014-07-13 DIAGNOSIS — N390 Urinary tract infection, site not specified: Secondary | ICD-10-CM | POA: Diagnosis not present

## 2014-07-16 ENCOUNTER — Other Ambulatory Visit: Payer: Self-pay | Admitting: Nurse Practitioner

## 2014-07-16 DIAGNOSIS — I4891 Unspecified atrial fibrillation: Secondary | ICD-10-CM | POA: Diagnosis not present

## 2014-07-16 DIAGNOSIS — K922 Gastrointestinal hemorrhage, unspecified: Secondary | ICD-10-CM

## 2014-07-16 DIAGNOSIS — R42 Dizziness and giddiness: Secondary | ICD-10-CM | POA: Diagnosis not present

## 2014-07-16 LAB — HEPATIC FUNCTION PANEL
ALT: 10 U/L (ref 7–35)
AST: 20 U/L (ref 13–35)
Alkaline Phosphatase: 99 U/L (ref 25–125)
Bilirubin, Total: 0.5 mg/dL

## 2014-07-16 LAB — CBC AND DIFFERENTIAL
HCT: 35 % — AB (ref 36–46)
Hemoglobin: 11.4 g/dL — AB (ref 12.0–16.0)
Platelets: 265 10*3/uL (ref 150–399)
WBC: 7 10^3/mL

## 2014-07-16 LAB — TSH: TSH: 3.38 u[IU]/mL (ref 0.41–5.90)

## 2014-07-16 LAB — BASIC METABOLIC PANEL
BUN: 15 mg/dL (ref 4–21)
Creatinine: 0.5 mg/dL (ref 0.5–1.1)
Glucose: 83 mg/dL
Potassium: 4.6 mmol/L (ref 3.4–5.3)
Sodium: 138 mmol/L (ref 137–147)

## 2014-07-17 DIAGNOSIS — N390 Urinary tract infection, site not specified: Secondary | ICD-10-CM | POA: Diagnosis not present

## 2014-07-29 DIAGNOSIS — Z85828 Personal history of other malignant neoplasm of skin: Secondary | ICD-10-CM | POA: Diagnosis not present

## 2014-07-29 DIAGNOSIS — L821 Other seborrheic keratosis: Secondary | ICD-10-CM | POA: Diagnosis not present

## 2014-07-29 DIAGNOSIS — Z86018 Personal history of other benign neoplasm: Secondary | ICD-10-CM | POA: Diagnosis not present

## 2014-07-29 DIAGNOSIS — R202 Paresthesia of skin: Secondary | ICD-10-CM | POA: Diagnosis not present

## 2014-08-05 ENCOUNTER — Other Ambulatory Visit (INDEPENDENT_AMBULATORY_CARE_PROVIDER_SITE_OTHER): Payer: Self-pay | Admitting: Otolaryngology

## 2014-08-05 DIAGNOSIS — R1310 Dysphagia, unspecified: Secondary | ICD-10-CM

## 2014-08-09 ENCOUNTER — Ambulatory Visit (HOSPITAL_COMMUNITY)
Admission: RE | Admit: 2014-08-09 | Discharge: 2014-08-09 | Disposition: A | Discharge status: Home / Self Care | Payer: Medicare Other | Source: Ambulatory Visit | Attending: Otolaryngology | Admitting: Otolaryngology

## 2014-08-09 ENCOUNTER — Ambulatory Visit (HOSPITAL_COMMUNITY): Payer: Medicare Other

## 2014-08-09 DIAGNOSIS — K219 Gastro-esophageal reflux disease without esophagitis: Secondary | ICD-10-CM | POA: Insufficient documentation

## 2014-08-09 DIAGNOSIS — R1310 Dysphagia, unspecified: Secondary | ICD-10-CM | POA: Insufficient documentation

## 2014-08-12 DIAGNOSIS — R070 Pain in throat: Secondary | ICD-10-CM | POA: Diagnosis not present

## 2014-08-12 DIAGNOSIS — R1312 Dysphagia, oropharyngeal phase: Secondary | ICD-10-CM | POA: Diagnosis not present

## 2014-08-12 DIAGNOSIS — R490 Dysphonia: Secondary | ICD-10-CM | POA: Diagnosis not present

## 2014-08-13 ENCOUNTER — Non-Acute Institutional Stay: Payer: Medicare Other | Admitting: Internal Medicine

## 2014-08-13 ENCOUNTER — Encounter: Payer: Self-pay | Admitting: Internal Medicine

## 2014-08-13 VITALS — BP 122/64 | HR 73 | Temp 98.0°F | Resp 20 | Ht 62.0 in | Wt 99.0 lb

## 2014-08-13 DIAGNOSIS — L299 Pruritus, unspecified: Secondary | ICD-10-CM | POA: Diagnosis not present

## 2014-08-13 DIAGNOSIS — G8929 Other chronic pain: Secondary | ICD-10-CM | POA: Insufficient documentation

## 2014-08-13 DIAGNOSIS — M545 Low back pain, unspecified: Secondary | ICD-10-CM

## 2014-08-13 DIAGNOSIS — R490 Dysphonia: Secondary | ICD-10-CM | POA: Diagnosis not present

## 2014-08-13 DIAGNOSIS — I6523 Occlusion and stenosis of bilateral carotid arteries: Secondary | ICD-10-CM | POA: Diagnosis not present

## 2014-08-13 DIAGNOSIS — R1314 Dysphagia, pharyngoesophageal phase: Secondary | ICD-10-CM | POA: Diagnosis not present

## 2014-08-13 DIAGNOSIS — I6529 Occlusion and stenosis of unspecified carotid artery: Secondary | ICD-10-CM | POA: Insufficient documentation

## 2014-08-13 NOTE — Progress Notes (Signed)
Patient ID: Vanessa [name], female   DOB: 1920/07/08, 81 y.o.   MRN: [id number]    [place] Room Number: AL 1  Place of Service:  [facility]    Allergies  Allergen Reactions  . Tape Other (See Comments)    Tears skin. Band-Aids    Chief Complaint  Patient presents with  . Medical Management of Chronic Issues    medication?    HPI:   PruritusSaw Dr. Raiford Simmonds. Gave her sample of Atrapro. It helped the way her skin feels. It is expensive. Has also used Aveeno with hydrocortisone. Sarna burned her skin.  Dysphagia, pharyngoesophageal phase; has seen Dr. Benjamine Mola. He recommended barium swallow. This was done 08/09/2014. It showed normal swallowing function without aspiration. She also has a tight cricopharyngeus muscle with narrowing of the lumen which was smooth and benign appearing. There was no significant diverticulum. Barium tablet did not pass this area for approximately 5 minutes despite multiple sips of barium and water. There was mild distal esophageal dysmotility. No hiatal hernia. Mild gastroesophageal reflux.  Midline low back pain without sciatica - chronic problem for which she liked take something mild. She can use liquid Tylenol which does seem to help.  Carotid stenosis, bilateral- routine follow-up of a 40% right carotid occlusion with ultrasound of the carotids has been scheduled for late August 2016. The last ultrasound of the carotid was in 2014. It disclosed a total left carotid occlusion as well as the 40% right carotid occlusion. Patient has had no recent symptoms of dizziness.  Hoarse - she believes this is related to the type cricopharyngeus muscle. Is equally possible to be related to the GERD recently disclosed on barium swallow.    Medications: Patient's Medications  New Prescriptions   No medications on file  Previous Medications   ACETAMINOPHEN-CODEINE 120-12 MG/5ML SOLUTION       ALPRAZOLAM (XANAX) 0.25 MG TABLET     Take 0.25 mg by mouth at bedtime as needed for anxiety.   ASPIRIN 81 MG CHEWABLE TABLET    Chew 81 mg by mouth daily.   ATENOLOL (TENORMIN) 25 MG TABLET    TAKE 1 TABLET TWICE DAILY.   CALCIUM-VITAMIN D-VITAMIN K 272-536-64 MG-UNT-MCG CHEW    Chew by mouth. One at bedtime for calcium   DOCUSATE SODIUM (COLACE) 100 MG CAPSULE    Take 100 mg by mouth. As needed for constipation   MULTIPLE VITAMIN (MULTIVITAMIN) TABLET    Take 1 tablet by mouth daily.   OMEPRAZOLE (PRILOSEC) 20 MG CAPSULE    Take 20 mg by mouth daily.   PRAMOXINE-HYDROCORTISONE 1-1 % LOTION    Apply to areas that itch up to twice daily   PSYLLIUM (METAMUCIL PO)    Take by mouth. Take one tablespoon with 8 oz of water daily for constipation   SIMVASTATIN (ZOCOR) 40 MG TABLET    Take 20 mg by mouth every evening.   Modified Medications   No medications on file  Discontinued Medications   No medications on file     Review of Systems  Constitutional: Positive for appetite change, fatigue and unexpected weight change. Negative for fever, chills, diaphoresis and activity change.  HENT: Positive for hearing loss and trouble swallowing. Negative for congestion, ear discharge, ear pain, postnasal drip, rhinorrhea, sore throat, tinnitus and voice change.        Chronic sinus congestion. Dentures. Xerostomia.  Eyes: Negative for pain, redness, itching and visual disturbance (Corrective lenses).  Respiratory: Positive  for shortness of breath (On exertion). Negative for cough, choking and wheezing.   Cardiovascular: Positive for palpitations. Negative for chest pain and leg swelling.       Hx AF  Gastrointestinal: Negative for nausea, abdominal pain, diarrhea, constipation and abdominal distention.       HX GI bleed In Sept 2015. Difficulty swallowing solids. Avoids beef. Recent barium swallow 08/09/14 showed tight cricopharyngeus muscle, GERD, and mild esophageal dysmotility.  Endocrine: Negative for cold intolerance, heat intolerance,  polydipsia, polyphagia and polyuria.       Elevated glucose 178 in Jan 2017.  Genitourinary: Negative for dysuria, urgency, frequency, hematuria, flank pain, vaginal discharge, difficulty urinating and pelvic pain.  Musculoskeletal: Positive for back pain and gait problem. Negative for myalgias, arthralgias, neck pain and neck stiffness.       Hx scoliosis and surgery of the lower back to partially correct it.  Skin: Negative for color change, pallor and rash.       Chronic pruritus.  Has multiple lesions of the back including irritated seborrheic keratoses, atrophic plaques, multiple angiomas.  Allergic/Immunologic: Negative.   Neurological: Positive for dizziness. Negative for tremors, seizures, syncope, weakness, numbness and headaches.       History of neuropathy with some numbness in the feet.  Hematological: Negative for adenopathy. Does not bruise/bleed easily.       Anemia resolved in May 2016  Psychiatric/Behavioral: Negative for suicidal ideas, hallucinations, behavioral problems, confusion, sleep disturbance, dysphoric mood and agitation. The patient is not nervous/anxious and is not hyperactive.     Filed Vitals:   08/13/14 1122  BP: 122/64  Pulse: 73  Temp: 98 F (36.7 C)  TempSrc: Oral  Resp: 20  Height: 5\' 2"  (1.575 m)  Weight: 99 lb (44.906 kg)  SpO2: 91%   Body mass index is 18.1 kg/(m^2).  Physical Exam  Constitutional: She is oriented to person, place, and time. She appears well-developed and well-nourished. No distress.  Thin, frail.  HENT:  Right Ear: External ear normal.  Left Ear: External ear normal.  Nose: Nose normal.  Mouth/Throat: Oropharynx is clear and moist. No oropharyngeal exudate.  Eyes: Conjunctivae and EOM are normal. Pupils are equal, round, and reactive to light. No scleral icterus.  Neck: No JVD present. No tracheal deviation present. No thyromegaly present.  Cardiovascular: Normal rate, normal heart sounds and intact distal pulses.  Exam  reveals no gallop and no friction rub.   No murmur heard. Atrial fibrillation. Controlled rate.  Pulmonary/Chest: Effort normal. No respiratory distress. She has no wheezes. She has no rales. She exhibits no tenderness.  Hoarse   Abdominal: She exhibits no distension and no mass. There is no tenderness.  Musculoskeletal: Normal range of motion. She exhibits no edema or tenderness.  Scar in lumbar area secondary prior surgery. Some difficulty lying down and getting up from the exam table.  Lymphadenopathy:    She has no cervical adenopathy.  Neurological: She is alert and oriented to person, place, and time. No cranial nerve deficit. Coordination normal.  Skin: No rash noted. She is not diaphoretic. No erythema. No pallor.  Multiple seborrheic keratoses the temple and on the back.  Psychiatric: She has a normal mood and affect. Her behavior is normal. Judgment and thought content normal.     Labs reviewed: Lab on 07/16/2014  Component Date Value Ref Range Status  . Hemoglobin 07/16/2014 11.4* 12.0 - 16.0 g/dL Final  . HCT 07/16/2014 35* 36 - 46 % Final  . Platelets 07/16/2014  265  150 - 399 K/L Final  . WBC 07/16/2014 7.0   Final  . Glucose 07/16/2014 83   Final  . BUN 07/16/2014 15  4 - 21 mg/dL Final  . Creatinine 07/16/2014 0.5  0.5 - 1.1 mg/dL Final  . Potassium 07/16/2014 4.6  3.4 - 5.3 mmol/L Final  . Sodium 07/16/2014 138  137 - 147 mmol/L Final  . Alkaline Phosphatase 07/16/2014 99  25 - 125 U/L Final  . ALT 07/16/2014 10  7 - 35 U/L Final  . AST 07/16/2014 20  13 - 35 U/L Final  . Bilirubin, Total 07/16/2014 0.5   Final  . TSH 07/16/2014 3.38  0.41 - 5.90 uIU/mL Final  Lab on 05/28/2014  Component Date Value Ref Range Status  . Hemoglobin 05/27/2014 12.2  12.0 - 16.0 g/dL Final  . HCT 05/27/2014 38  36 - 46 % Final  . Platelets 05/27/2014 288  150 - 399 K/L Final  . WBC 05/27/2014 6.9   Final     Assessment/Plan  1. Pruritus Patient will continue the Aveeno  with hydrocortisone until she can work out something better for pricing on the Atrapro  2. Dysphagia, pharyngoesophageal phase I recommended patient follow through with seeing a specialist in [facility] that was recommended by Dr. Benjamine Mola.  3. Midline low back pain without sciatica Continue Tylenol  4. Carotid stenosis, bilateral Advised her to show up for the follow-up carotid ultrasound in late August 2016  5. Hoarse No further advice at this time.

## 2014-08-15 ENCOUNTER — Other Ambulatory Visit: Payer: Self-pay | Admitting: Cardiology

## 2014-08-27 DIAGNOSIS — Z90710 Acquired absence of both cervix and uterus: Secondary | ICD-10-CM | POA: Diagnosis not present

## 2014-08-27 DIAGNOSIS — Z79899 Other long term (current) drug therapy: Secondary | ICD-10-CM | POA: Diagnosis not present

## 2014-08-27 DIAGNOSIS — Z9181 History of falling: Secondary | ICD-10-CM | POA: Diagnosis not present

## 2014-08-27 DIAGNOSIS — Z9049 Acquired absence of other specified parts of digestive tract: Secondary | ICD-10-CM | POA: Diagnosis not present

## 2014-08-27 DIAGNOSIS — I6523 Occlusion and stenosis of bilateral carotid arteries: Secondary | ICD-10-CM | POA: Diagnosis not present

## 2014-08-27 DIAGNOSIS — R490 Dysphonia: Secondary | ICD-10-CM | POA: Insufficient documentation

## 2014-08-27 DIAGNOSIS — J383 Other diseases of vocal cords: Secondary | ICD-10-CM | POA: Diagnosis not present

## 2014-08-27 DIAGNOSIS — IMO0001 Reserved for inherently not codable concepts without codable children: Secondary | ICD-10-CM | POA: Insufficient documentation

## 2014-08-27 DIAGNOSIS — R634 Abnormal weight loss: Secondary | ICD-10-CM | POA: Diagnosis not present

## 2014-08-27 DIAGNOSIS — Z8249 Family history of ischemic heart disease and other diseases of the circulatory system: Secondary | ICD-10-CM | POA: Diagnosis not present

## 2014-08-27 DIAGNOSIS — Z809 Family history of malignant neoplasm, unspecified: Secondary | ICD-10-CM | POA: Diagnosis not present

## 2014-08-27 DIAGNOSIS — T17908A Unspecified foreign body in respiratory tract, part unspecified causing other injury, initial encounter: Secondary | ICD-10-CM | POA: Diagnosis not present

## 2014-08-27 DIAGNOSIS — J392 Other diseases of pharynx: Secondary | ICD-10-CM | POA: Diagnosis not present

## 2014-08-27 DIAGNOSIS — I4891 Unspecified atrial fibrillation: Secondary | ICD-10-CM | POA: Diagnosis not present

## 2014-08-27 DIAGNOSIS — Z823 Family history of stroke: Secondary | ICD-10-CM | POA: Diagnosis not present

## 2014-08-27 DIAGNOSIS — R479 Unspecified speech disturbances: Secondary | ICD-10-CM | POA: Insufficient documentation

## 2014-08-27 DIAGNOSIS — Z7982 Long term (current) use of aspirin: Secondary | ICD-10-CM | POA: Diagnosis not present

## 2014-08-27 DIAGNOSIS — R1310 Dysphagia, unspecified: Secondary | ICD-10-CM | POA: Diagnosis not present

## 2014-08-27 DIAGNOSIS — R1314 Dysphagia, pharyngoesophageal phase: Secondary | ICD-10-CM | POA: Diagnosis not present

## 2014-09-03 ENCOUNTER — Encounter: Payer: Self-pay | Admitting: Internal Medicine

## 2014-09-03 ENCOUNTER — Other Ambulatory Visit: Payer: Self-pay | Admitting: Family

## 2014-09-03 ENCOUNTER — Encounter: Payer: Self-pay | Admitting: Family

## 2014-09-03 DIAGNOSIS — I6522 Occlusion and stenosis of left carotid artery: Secondary | ICD-10-CM

## 2014-09-04 ENCOUNTER — Ambulatory Visit (HOSPITAL_COMMUNITY)
Admission: RE | Admit: 2014-09-04 | Discharge: 2014-09-04 | Disposition: A | Discharge status: Home / Self Care | Payer: Medicare Other | Source: Ambulatory Visit | Attending: Family | Admitting: Family

## 2014-09-04 ENCOUNTER — Encounter: Payer: Self-pay | Admitting: Family

## 2014-09-04 ENCOUNTER — Ambulatory Visit (INDEPENDENT_AMBULATORY_CARE_PROVIDER_SITE_OTHER): Payer: Medicare Other | Admitting: Family

## 2014-09-04 VITALS — BP 126/72 | HR 75 | Temp 97.8°F | Resp 16 | Ht 62.0 in | Wt 102.0 lb

## 2014-09-04 DIAGNOSIS — I8393 Asymptomatic varicose veins of bilateral lower extremities: Secondary | ICD-10-CM

## 2014-09-04 DIAGNOSIS — I6523 Occlusion and stenosis of bilateral carotid arteries: Secondary | ICD-10-CM | POA: Diagnosis not present

## 2014-09-04 DIAGNOSIS — I6522 Occlusion and stenosis of left carotid artery: Secondary | ICD-10-CM | POA: Diagnosis not present

## 2014-09-04 DIAGNOSIS — I6521 Occlusion and stenosis of right carotid artery: Secondary | ICD-10-CM | POA: Diagnosis not present

## 2014-09-04 NOTE — Progress Notes (Signed)
Established Carotid Patient   History of Present Illness  Vanessa [name] is a 81 y.o. female patient that Dr. Scot Dock has been monitoring for carotid artery stenosis; she has a known left internal carotid artery occlusion and minimal right ICA stenosis. She returns today for routine follow up. Has lower legs varicosities with no edema. She spoke with our vein clinic nurse in the past re her varicosities and was given a pair of compression hose, but cannot get them on due to the weakness in her wrists. [name] spoke with pt again today and discussed donning compression hose.  Patient has not had previous carotid artery intervention.  Patient has Negative history of TIA or stroke symptom. The patient denies amaurosis fugax or monocular blindness. The patient denies facial drooping. Pt. denies hemiplegia. The patient denies receptive or expressive aphasia.   Pt reports New Medical or Surgical History: tooth extracted. Also has had some recent presyncopal episodes; states she has not seen Dr. Mare Ferrari since this has happened. 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  The hematoma on her left lower leg has healed.  Pt Diabetic: No Pt smoker: non-smoker  Pt meds include: Statin : Yes ASA: No Other anticoagulants/antiplatelets: coumadin for atrial fib  Past Medical History  Diagnosis Date  . Atrial fibrillation   . Neuropathy     PERIPHERAL  . Carotid artery occlusion     LEFT  . Scoliosis   . Hoarseness   . Headache(784.0)   . Dizziness   . Abdominal bloating   . Bruises easily   . Pruritus   . Hypercholesterolemia   . Fall at home Sept 2013, Dec. 2013  Jun 08, 2012  . Varicose veins     Social History Social History  Substance Use Topics  . Smoking status: Never Smoker   . Smokeless tobacco: Never Used  . Alcohol Use: No    Family History Family History  Problem Relation Age of Onset  . Heart disease    .  Diabetes Mother   . Heart attack Mother   . Heart disease Mother     After age 54  . Heart attack Father   . Stroke Father   . Cancer Sister     Breast  . Heart disease Maternal Grandmother     Surgical History Past Surgical History  Procedure Laterality Date  . Abdominal hysterectomy  1954  . Spine surgery  1997    correct scoliosis  . Cholecystectomy  1997    Allergies  Allergen Reactions  . Tape Other (See Comments)    Tears skin. Band-Aids    Current Outpatient Prescriptions  Medication Sig Dispense Refill  . acetaminophen-codeine 120-12 MG/5ML solution     . ALPRAZolam (XANAX) 0.25 MG tablet Take 0.25 mg by mouth at bedtime as needed for anxiety.    Marland Kitchen aspirin 81 MG chewable tablet Chew 81 mg by mouth daily.    Marland Kitchen atenolol (TENORMIN) 25 MG tablet TAKE 1 TABLET TWICE DAILY. 60 tablet 6  . Calcium-Vitamin D-Vitamin K 017-510-25 MG-UNT-MCG CHEW Chew by mouth. One at bedtime for calcium    . docusate sodium (COLACE) 100 MG capsule Take 100 mg by mouth. As needed for constipation    . Multiple Vitamin (MULTIVITAMIN) tablet Take 1 tablet by mouth daily.    Marland Kitchen omeprazole (PRILOSEC) 20 MG capsule Take 20 mg by mouth daily.    . pramoxine-hydrocortisone 1-1 % lotion Apply to areas that itch up to twice  daily (Patient not taking: Reported on 08/13/2014) 118 mL 5  . Psyllium (METAMUCIL PO) Take by mouth. Take one tablespoon with 8 oz of water daily for constipation    . simvastatin (ZOCOR) 40 MG tablet TAKE (1/2) TABLET DAILY. 15 tablet 2   No current facility-administered medications for this visit.    Review of Systems : See HPI for pertinent positives and negatives.  Physical Examination  Filed Vitals:   09/04/14 1345  BP: 137/62  Pulse: 78  Temp: 97.8 F (36.6 C)  Resp: 16  Height: 5\' 2"  (1.575 m)  Weight: 102 lb (46.267 kg)   Body mass index is 18.65 kg/(m^2).   General: WDWN elderly female in NAD GAIT: normal Eyes: PERRLA Pulmonary: Non-labored, CTAB, no  rales, no rhonchi, & no wheezing.  Cardiac: irregular Rhythm, no detected murmur.  VASCULAR EXAM Carotid Bruits Right Left   Negative Negative   Radial pulses are 2+ palpable and equal.      LE Pulses Right Left   POPLITEAL not palpable  not palpable   POSTERIOR TIBIAL 1+ palpable  2+ palpable    DORSALIS PEDIS  ANTERIOR TIBIAL 2+ palpable  2+ palpable     Gastrointestinal: soft, nontender, BS WNL, no r/g, no palpable masses.  Musculoskeletal: Negative muscle atrophy/wasting. M/S 4/5 throughout, Extremities without ischemic changes, varicosities in both lower legs.  Neurologic: A&O X 3; Appropriate Affect, Speech is normal CN 2-12 intact, mild hearing loss, Pain and light touch intact in extremities, Motor exam as listed above.                Non-Invasive Vascular Imaging CAROTID DUPLEX 09/04/2014   CEREBROVASCULAR DUPLEX EVALUATION    INDICATION: Left internal carotid artery occlusion    PREVIOUS INTERVENTION(S): NA    DUPLEX EXAM:     RIGHT  LEFT  Peak Systolic Velocities (cm/s) End Diastolic Velocities (cm/s) Plaque LOCATION Peak Systolic Velocities (cm/s) End Diastolic Velocities (cm/s) Plaque  107 30  CCA PROXIMAL 79 0   72 18  CCA MID 46 0   75 21 HT CCA DISTAL 26 0 HT  77 5 HT ECA 62 6 HT  93 28 HT ICA PROXIMAL 53 0 occluded  88 26  ICA MID 11 0 occluded  110 24  ICA DISTAL 14 0 occluded     ICA / CCA Ratio (PSV) ICA occlusion  Antegrade Vertebral Flow Antegrade  324 Brachial Systolic Pressure (mmHg) 401  Triphasic Brachial Artery Waveforms Triphasic    Plaque Morphology:  HM = Homogeneous, HT = Heterogeneous, CP = Calcific Plaque, SP = Smooth Plaque, IP = Irregular Plaque     ADDITIONAL FINDINGS: Right subclavian artery PSV124cm/sec; Left subclavian  artery PSV130cm/sec    IMPRESSION: Right internal carotid artery stenosis present in the less than 40% range. Known left internal carotid artery occlusion present with minimal abnormal blunted flow intermittently present.    Compared to the previous exam:  No change since previous study on 08/22/2013.      Assessment: Vanessa [name] is a 81 y.o. female who has a known left internal carotid artery occlusion and minimal right ICA stenosis. She has no history of stroke or TIA. Today's carotid Duplex suggests  less than 40% right ICA stenosis and known left internal carotid artery occlusion present with minimal abnormal blunted flow intermittently present. No change since previous study on 08/22/2013.  Pt advised to see Dr. Mare Ferrari ASAP re her recent pre-syncopal episodes.   Plan: Follow-up in 1 year with  Carotid Duplex.   I discussed in depth with the patient the nature of atherosclerosis, and emphasized the importance of maximal medical management including strict control of blood pressure, blood glucose, and lipid levels, obtaining regular exercise, and continued cessation of smoking.  The patient is aware that without maximal medical management the underlying atherosclerotic disease process will progress, limiting the benefit of any interventions. The patient was given information about stroke prevention and what symptoms should prompt the patient to seek immediate medical care. Thank you for allowing [place] to participate in this patient's care.  Clemon Chambers, RN, MSN, FNP-C [facility] Office: [phone]  Clinic Physician: Scot Dock  09/04/2014 1:42 PM

## 2014-09-04 NOTE — Patient Instructions (Signed)
Stroke Prevention Some medical conditions and behaviors are associated with an increased chance of having a stroke. You may prevent a stroke by making healthy choices and managing medical conditions. HOW CAN I REDUCE MY RISK OF HAVING A STROKE?   Stay physically active. Get at least 30 minutes of activity on most or all days.  Do not smoke. It may also be helpful to avoid exposure to secondhand smoke.  Limit alcohol use. Moderate alcohol use is considered to be:  No more than 2 drinks per day for men.  No more than 1 drink per day for nonpregnant women.  Eat healthy foods. This involves:  Eating 5 or more servings of fruits and vegetables a day.  Making dietary changes that address high blood pressure (hypertension), high cholesterol, diabetes, or obesity.  Manage your cholesterol levels.  Making food choices that are high in fiber and low in saturated fat, trans fat, and cholesterol may control cholesterol levels.  Take any prescribed medicines to control cholesterol as directed by your health care provider.  Manage your diabetes.  Controlling your carbohydrate and sugar intake is recommended to manage diabetes.  Take any prescribed medicines to control diabetes as directed by your health care provider.  Control your hypertension.  Making food choices that are low in salt (sodium), saturated fat, trans fat, and cholesterol is recommended to manage hypertension.  Take any prescribed medicines to control hypertension as directed by your health care provider.  Maintain a healthy weight.  Reducing calorie intake and making food choices that are low in sodium, saturated fat, trans fat, and cholesterol are recommended to manage weight.  Stop drug abuse.  Avoid taking birth control pills.  Talk to your health care provider about the risks of taking birth control pills if you are over 35 years old, smoke, get migraines, or have ever had a blood clot.  Get evaluated for sleep  disorders (sleep apnea).  Talk to your health care provider about getting a sleep evaluation if you snore a lot or have excessive sleepiness.  Take medicines only as directed by your health care provider.  For some people, aspirin or blood thinners (anticoagulants) are helpful in reducing the risk of forming abnormal blood clots that can lead to stroke. If you have the irregular heart rhythm of atrial fibrillation, you should be on a blood thinner unless there is a good reason you cannot take them.  Understand all your medicine instructions.  Make sure that other conditions (such as anemia or atherosclerosis) are addressed. SEEK IMMEDIATE MEDICAL CARE IF:   You have sudden weakness or numbness of the face, arm, or leg, especially on one side of the body.  Your face or eyelid droops to one side.  You have sudden confusion.  You have trouble speaking (aphasia) or understanding.  You have sudden trouble seeing in one or both eyes.  You have sudden trouble walking.  You have dizziness.  You have a loss of balance or coordination.  You have a sudden, severe headache with no known cause.  You have new chest pain or an irregular heartbeat. Any of these symptoms may represent a serious problem that is an emergency. Do not wait to see if the symptoms will go away. Get medical help at once. Call your local emergency services (911 in [place] Do not drive yourself to the hospital. Document Released: 02/05/2004 Document Revised: 05/14/2013 Document Reviewed: 06/30/2012 [employer] Patient Information 2015 [employer]. This information is not intended to replace advice given   to you by your health care provider. Make sure you discuss any questions you have with your health care provider.  

## 2014-09-05 ENCOUNTER — Telehealth: Payer: Self-pay | Admitting: Cardiology

## 2014-09-05 NOTE — Telephone Encounter (Signed)
Follow up   Pt is calling to speak to [facility], pt states she will be here at 11:00am 09/06/14  i do not see her scheduled for that time, she states that the RN will fit her in.  Please call pt, thank you

## 2014-09-05 NOTE — Telephone Encounter (Signed)
Pt c/o Shortness Of Breath: STAT if SOB developed within the last 24 hours or pt is noticeably SOB on the phone   1. Are you currently SOB (can you hear that pt is SOB on the phone)? Yes  2. How long have you been experiencing SOB? Yesterday at Dr. Toney Sang office   3. Are you SOB when sitting or when up moving around? both  4. Are you currently experiencing any other symptoms? Light headed

## 2014-09-05 NOTE — Telephone Encounter (Signed)
Patient saw Dr. Scot Dock yesterday, and in office visit note he stated " Pt advised to see Dr. Mare Ferrari ASAP for her recent pre-syncopal episodes." Patient has given [place] her cell phone number in case she can't be reached with other number, [phone]. Patient stated she has dizziness when she goes from laying on her back to standing up. Patient is not SOB at this time, but stated that she gets SOB at times. Patient wants to be seen by Dr. Mare Ferrari ASAP. Patient is living in an assisted living community and stated that her HR and BP is fine according to readings this am. Will forward to Dr. Mare Ferrari and his nurse, Rip Harbour, to see if they can squeeze her in. Patient can get a ride tomorrow after 1 pm.

## 2014-09-05 NOTE — Telephone Encounter (Signed)
F/u         Pt wants to add to her previous message, pt can get a ride tomorrow afternoon after 1pm

## 2014-09-05 NOTE — Addendum Note (Signed)
Addended by: Dorthula Rue L on: 09/05/2014 09:47 AM   Modules accepted: Orders

## 2014-09-05 NOTE — Telephone Encounter (Signed)
Patient coming tomorrow

## 2014-09-06 ENCOUNTER — Ambulatory Visit (INDEPENDENT_AMBULATORY_CARE_PROVIDER_SITE_OTHER): Payer: Medicare Other | Admitting: Cardiology

## 2014-09-06 ENCOUNTER — Encounter: Payer: Self-pay | Admitting: Cardiology

## 2014-09-06 VITALS — BP 110/56 | HR 71 | Ht 62.0 in | Wt 100.8 lb

## 2014-09-06 DIAGNOSIS — I482 Chronic atrial fibrillation, unspecified: Secondary | ICD-10-CM

## 2014-09-06 DIAGNOSIS — I6522 Occlusion and stenosis of left carotid artery: Secondary | ICD-10-CM

## 2014-09-06 DIAGNOSIS — I6523 Occlusion and stenosis of bilateral carotid arteries: Secondary | ICD-10-CM

## 2014-09-06 DIAGNOSIS — R42 Dizziness and giddiness: Secondary | ICD-10-CM | POA: Diagnosis not present

## 2014-09-06 NOTE — Progress Notes (Signed)
Cardiology Office Note   Date:  09/06/2014   ID:  Vanessa [name], DOB 01-22-1920, MRN [id number]  PCP:  Vanessa Dooms, MD  Cardiologist: [name] MD  No chief complaint on file.     History of Present Illness: Vanessa [name] is a 81 y.o. female who presents for a work in office visit.  She is being seen because of unusual dizzy spells.  She has intolerable dizziness if she tries to lie flat on her back.  She had a spell several weeks ago in the dentist office when they tried to lay her back to work on her teeth.  She had another spell yesterday at the vascular surgeon office as she was getting her carotid Dopplers and had to lie flat on her back.  Today she had a similar milder episode again when I had her lie back.  There is no associated nystagmus during the episode today.  There is no sensation of spinning of the room to suggest vertigo.  She feels as if there is a pressure at the back of her cervical spine when she tries to lie flat. . She has a history of atypical chest pain. She was seen in March 2013 with these symptoms and underwent a nuclear stress test subsequently which showed no ischemia. The patient reports that her symptoms have improved. She recently saw Dr. Cristina [name] who placed her on Nexium for dysphagia. She reports improvement in those symptoms since starting on Nexium. The Nexium was subsequently switched to omeprazole. She has a history of chronic established atrial fibrillation. He previously had been on Coumadin for many years. However she has had several devastating falls. He had another fall this summer and was hospitalized and then transferred to skilled nursing facility for rehabilitation. As a result of that fall she broke 5 ribs. The fall occurred when she just lost her balance. She has a history of vascular disease with known chronic occlusion of her left carotid artery. She has a past history of high blood pressure.  Her CHADSSVASC score is 5  (for age 48, female sex, high blood pressure, and vascular disease with occluded left internal carotid artery). However she is no longer on warfarin because of her falls. She has a known left carotid artery chronic occlusion. She is followed for vascular disease by Dr. Gae [name]. Since we last saw her she has moved to a [facility] [place] assisted living facility. She still drives her car back to her other home each day to pick up her mail and check on her other home. She takes her meals at [facility] [place]. Since last visit patient has not had any severe chest discomfort. Has had a lot of problems with skin irritation and has seen 3 different dermatologists. She has also seen Dr. Ernesto [name] about her sinuses and Dr. Cristina [name] checks her about her GI tract and recently decreased her omeprazole to just one a day   Past Medical History  Diagnosis Date  . Atrial fibrillation   . Neuropathy     PERIPHERAL  . Carotid artery occlusion     LEFT  . Scoliosis   . Hoarseness   . Headache(784.0)   . Dizziness   . Abdominal bloating   . Bruises easily   . Pruritus   . Hypercholesterolemia   . Fall at home Sept 2013, Dec. 2013  Jun 08, 2012  . Varicose veins     Past Surgical History  Procedure Laterality Date  .  Abdominal hysterectomy  1954  . Spine surgery  1997    correct scoliosis  . Cholecystectomy  1997     Current Outpatient Prescriptions  Medication Sig Dispense Refill  . ALPRAZolam (XANAX) 0.25 MG tablet Take 0.25 mg by mouth at bedtime as needed for anxiety.    Marland Kitchen aspirin 81 MG chewable tablet Chew 81 mg by mouth daily.    Marland Kitchen atenolol (TENORMIN) 25 MG tablet TAKE 1 TABLET TWICE DAILY. 60 tablet 6  . Biotin (BIOTIN 5000) 5 MG CAPS Take 5,000 mg by mouth daily.    . Calcium-Vitamin D-Vitamin K 254-270-62 MG-UNT-MCG CHEW Chew by mouth. One at bedtime for calcium    . docusate sodium (COLACE) 100 MG capsule Take 100 mg by mouth. As needed for constipation    . Multiple  Vitamin (MULTIVITAMIN) tablet Take 1 tablet by mouth daily.    . Multiple Vitamins-Minerals (ANTIOXIDANT FORMULA SG) capsule Take 1 capsule by mouth daily.    Marland Kitchen omeprazole (PRILOSEC) 20 MG capsule Take 20 mg by mouth daily.    . pramoxine-hydrocortisone 1-1 % lotion Apply to areas that itch up to twice daily 118 mL 5  . Psyllium (METAMUCIL PO) Take by mouth. Take one tablespoon with 8 oz of water daily for constipation    . simvastatin (ZOCOR) 40 MG tablet TAKE (1/2) TABLET DAILY. 15 tablet 2   No current facility-administered medications for this visit.    Allergies:   Tape    Social History:  The patient  reports that she has never smoked. She has never used smokeless tobacco. She reports that she does not drink alcohol or use illicit drugs.   Family History:  The patient's family history includes Cancer in her sister; Diabetes in her mother; Heart attack in her father and mother; Heart disease in her maternal grandmother, mother, and another family member; Stroke in her father.    ROS:  Please see the history of present illness.   Otherwise, review of systems are positive for none.   All other systems are reviewed and negative.    PHYSICAL EXAM: VS:  BP 110/56 mmHg  Pulse 71  Ht 5\' 2"  (1.575 m)  Wt 100 lb 12.8 oz (45.723 kg)  BMI 18.43 kg/m2  SpO2 98% , BMI Body mass index is 18.43 kg/(m^2). GEN: Well nourished, well developed, in no acute distress HEENT: normal Neck: no JVD, carotid bruits, or masses Cardiac: Is irregularly irregular rhythm and atrial fibrillation with ventricular response 71 bpm. no murmurs, rubs, or gallops,no edema  Respiratory:  clear to auscultation bilaterally, normal work of breathing GI: soft, nontender, nondistended, + BS MS: no deformity or atrophy Skin: warm and dry, no rash Neuro:  Strength and sensation are intact Psych: euthymic mood, full affect   EKG:  EKG is ordered today. The ekg ordered today demonstrates atrial fibrillation with  controlled ventricular response.  No ischemic changes.   Recent Labs: 09/13/2013: Magnesium 1.9 11/16/2013: Pro B Natriuretic peptide (BNP) 878.2* 07/16/2014: ALT 10; BUN 15; Creatinine 0.5; Hemoglobin 11.4*; Platelets 265; Potassium 4.6; Sodium 138; TSH 3.38    Lipid Panel    Component Value Date/Time   CHOL 148 04/05/2014   TRIG 94 04/05/2014   HDL 76* 04/05/2014   CHOLHDL 3 07/09/2013 0849   VLDL 25.2 07/09/2013 0849   LDLCALC 53 04/05/2014      Wt Readings from Last 3 Encounters:  09/06/14 100 lb 12.8 oz (45.723 kg)  09/04/14 102 lb (46.267 kg)  08/13/14 99 lb (  44.906 kg)        ASSESSMENT AND PLAN:  1. Permanent atrial fibrillation. Chadsvasc score is 5. Not an anticoagulation candidate because of repeated severe falls. Continue baby aspirin daily 2. known left carotid artery occlusion. 3. Hypercholesterolemia. 4. Essential hypertension without heart failure 5. Osteoarthritis 6.  Dizziness when patient lies flat on her back.  Continue same medication. Recheck in 4 months for follow-up office visit. Dr. Jeanmarie Hubert is now her new PCP at [facility] [place]. The episodes of intolerable sensation of dizziness and weakness when she lies flat on her back are of uncertain etiology.  They do not appear to be cardiac.  Her vital signs remained stable.  She did not have any visible nystagmus.. We will refer her to neurology for further evaluation. The patient is also in the middle of an evaluation by Dr.Teoh regarding the need for esophageal dilatation.  The patient has difficulty swallowing pills.  Dr. Benjamine Mola has referred her to an endoscopist at [facility].  She is very concerned that she will not be able to lie flat for any endoscopy procedure. Her vital signs are stable today.  No change in medications.   Current medicines are reviewed at length with the patient today.  The patient does not have concerns regarding medicines.  The following changes have been made:   no change  Labs/ tests ordered today include:   Orders Placed This Encounter  Procedures  . Ambulatory referral to Neurology  . EKG 12-Lead    Disposition: Recheck in 3 months for office visit  Signed, [name] MD 09/06/2014 1:37 PM    [address], [place], [place]  [id number] Phone: [phone]; Fax: [phone]

## 2014-09-06 NOTE — Patient Instructions (Signed)
Medication Instructions:  Your physician recommends that you continue on your current medications as directed. Please refer to the Current Medication list given to you today.  Labwork: NONE  Testing/Procedures: NONE  Follow-Up: Your physician recommends that you schedule a follow-up appointment in: 3 MONTH OV   Any Other Special Instructions Will Be Listed Below (If Applicable). REFERRAL HAS BEEN MADE FOR YOU TO [facility]. IF YOU DO NOT HEAR FROM THE OFFICE BY MID WEEK NEXT WEEK CALL Marice Guidone AT [phone]

## 2014-09-17 ENCOUNTER — Encounter: Payer: Self-pay | Admitting: Internal Medicine

## 2014-09-18 ENCOUNTER — Encounter: Payer: Self-pay | Admitting: Neurology

## 2014-09-18 ENCOUNTER — Ambulatory Visit (INDEPENDENT_AMBULATORY_CARE_PROVIDER_SITE_OTHER): Payer: Medicare Other | Admitting: Neurology

## 2014-09-18 VITALS — BP 140/72 | HR 64 | Ht 62.0 in | Wt 102.0 lb

## 2014-09-18 DIAGNOSIS — R42 Dizziness and giddiness: Secondary | ICD-10-CM

## 2014-09-18 DIAGNOSIS — I6523 Occlusion and stenosis of bilateral carotid arteries: Secondary | ICD-10-CM | POA: Diagnosis not present

## 2014-09-18 NOTE — Progress Notes (Signed)
Reason for visit: Dizziness  Referring physician: Dr. Joycelyn Das is a 81 y.o. female  History of present illness:  [name] is a 81 year old right-handed white female with a history of cerebrovascular disease, and a history of left internal carotid artery occlusion. She has had episodes of dizziness off and on over the last several years, at least since 2013. The patient herself does not actually recall this, but she has had several CT head scan evaluations of the brain for dizziness and headaches. The patient indicates that one month ago, she began having episodes of dizziness again. She reports that the dizziness is not a true spinning sensation, but it is positional in nature. She gets dizzy when she lies flat on her back. She sleeps at night on one side or the other, and she is able to avoid the dizziness. She has had dizziness while doing her hair, during a dental appointment, and while getting a carotid Doppler study. The patient has a history of atrial fibrillation. She indicates that if she is still, the dizziness may go away in several moments. She is not bothered by dizziness while driving. She denies any blackout episodes. She has not had any focal numbness or weakness of the face, arms, or legs. She does have some chronic gait instability, she last fell in August 2015 fracturing some ribs. She uses a cane for ambulation. The patient does have some decrease in hearing, no ear pain or ringing in the ears. She is sent to this office for an evaluation.  Past Medical History  Diagnosis Date  . Atrial fibrillation   . Neuropathy     PERIPHERAL  . Carotid artery occlusion     LEFT  . Scoliosis   . Hoarseness   . Headache(784.0)   . Dizziness   . Abdominal bloating   . Bruises easily   . Pruritus   . Hypercholesterolemia   . Fall at home Sept 2013, Dec. 2013  Jun 08, 2012  . Varicose veins     Past Surgical History  Procedure Laterality Date  . Abdominal  hysterectomy  1954  . Spine surgery  1997    correct scoliosis  . Cholecystectomy  1997    Family History  Problem Relation Age of Onset  . Heart disease    . Diabetes Mother   . Heart attack Mother   . Heart disease Mother     After age 8  . Heart attack Father   . Stroke Father   . Cancer Sister     Breast  . Heart disease Maternal Grandmother     Social history:  reports that she has never smoked. She has never used smokeless tobacco. She reports that she does not drink alcohol or use illicit drugs.  Medications:  Prior to Admission medications   Medication Sig Start Date End Date Taking? Authorizing Provider  ALPRAZolam Duanne Moron) 0.25 MG tablet Take 0.25 mg by mouth at bedtime as needed for anxiety.    Historical Provider, MD  aspirin 81 MG chewable tablet Chew 81 mg by mouth daily.    Historical Provider, MD  atenolol (TENORMIN) 25 MG tablet TAKE 1 TABLET TWICE DAILY. 05/10/14   [name], MD  Biotin (BIOTIN 5000) 5 MG CAPS Take 5,000 mg by mouth daily.    Historical Provider, MD  Calcium-Vitamin D-Vitamin K [id number] MG-UNT-MCG CHEW Chew by mouth. One at bedtime for calcium    Historical Provider, MD  docusate sodium (COLACE) 100  MG capsule Take 100 mg by mouth. As needed for constipation    Historical Provider, MD  Multiple Vitamin (MULTIVITAMIN) tablet Take 1 tablet by mouth daily.    Historical Provider, MD  Multiple Vitamins-Minerals (ANTIOXIDANT FORMULA SG) capsule Take 1 capsule by mouth daily.    Historical Provider, MD  omeprazole (PRILOSEC) 20 MG capsule Take 20 mg by mouth daily.    Historical Provider, MD  pramoxine-hydrocortisone 1-1 % lotion Apply to areas that itch up to twice daily 06/18/14   Estill Dooms, MD  Psyllium (METAMUCIL PO) Take by mouth. Take one tablespoon with 8 oz of water daily for constipation    Historical Provider, MD  simvastatin (ZOCOR) 40 MG tablet TAKE (1/2) TABLET DAILY. 08/15/14   [name], MD      Allergies  Allergen  Reactions  . Tape Other (See Comments)    Tears skin. Band-Aids    ROS:  Out of a complete 14 system review of symptoms, the patient complains only of the following symptoms, and all other reviewed systems are negative.  Palpitations of the heart Swelling of the legs Hearing loss, difficulty swallowing Easy bruising Feeling cold Joint pain  Blood pressure 140/72, pulse 64, height 5\' 2"  (1.575 m), weight 102 lb (46.267 kg).  Physical Exam  General: The patient is alert and cooperative at the time of the examination.  Eyes: Pupils are equal, round, and reactive to light. Discs are flat bilaterally.  Ears: Tympanic membranes are clear.  Neck: The neck is supple, no carotid bruits are noted.  Respiratory: The respiratory examination is clear.  Cardiovascular: The cardiovascular examination reveals an irregular heart rhythm, no obvious murmurs or rubs are noted.  Skin: Extremities are without significant edema. The patient has varicose veins on both legs.  Neurologic Exam  Mental status: The patient is alert and oriented x 3 at the time of the examination. The patient has apparent normal recent and remote memory, with an apparently normal attention span and concentration ability.  Cranial nerves: Facial symmetry is present. There is good sensation of the face to pinprick and soft touch bilaterally. The strength of the facial muscles and the muscles to head turning and shoulder shrug are normal bilaterally. Speech is well enunciated, no aphasia or dysarthria is noted. Extraocular movements are full. Visual fields are full. The tongue is midline, and the patient has symmetric elevation of the soft palate. No obvious hearing deficits are noted.  Motor: The motor testing reveals 5 over 5 strength of all 4 extremities. Good symmetric motor tone is noted throughout.  Sensory: Sensory testing is intact to pinprick, soft touch, vibration sensation, and position sense on all 4  extremities, with exception of some decrease in position sense on the right foot. No evidence of extinction is noted.  Coordination: Cerebellar testing reveals good finger-nose-finger and heel-to-shin bilaterally. The Nyan-Barrany procedure was performed, the patient reported dizziness with the head tilted back, minimal horizontal and rotatory nystagmus was seen when looking to the right. The dizziness passed after sitting after 3 or 4 minutes.  Gait and station: Gait is slightly wide-based, slightly unsteady. Tandem gait was not attempted. Romberg is negative. No drift is seen.  Reflexes: Deep tendon reflexes are symmetric, but are depressed bilaterally. Toes are downgoing bilaterally.   CT head 01/22/14:  IMPRESSION: 1. No findings to explain the patient's intermittent dizziness. 2. Periventricular white matter and corona radiata hypodensities favor chronic ischemic microvascular white matter disease.  * CT scan images were reviewed online.  I agree with the written report.     Assessment/Plan:  1. Positional vertigo  2. Cerebrovascular disease  The patient is having episodes of dizziness that are clearly positional. This likely resents benign positional vertigo, but the patient does not describe true vertigo. She has had episodes over the last several years. She will be set up for MRI of the brain, MRA of the head. If the studies are relatively unremarkable, we will refer her for physical therapy for vestibular rehabilitation. The patient will follow-up if needed.  Jill Alexanders MD 09/18/2014 8:07 PM  [facility] [address] [place], [place] [address]  Phone [phone] Fax [phone]

## 2014-09-18 NOTE — Patient Instructions (Addendum)
We will set you up for MRI evaluation of the brain, and do MRA of the head looking at the blood vessel circulation to the head. If this is unremarkable, we will consider physical therapy for vestibular rehabilitation.  Dizziness Dizziness is a common problem. It is a feeling of unsteadiness or light-headedness. You may feel like you are about to faint. Dizziness can lead to injury if you stumble or fall. A person of any age group can suffer from dizziness, but dizziness is more common in older adults. CAUSES  Dizziness can be caused by many different things, including:  Middle ear problems.  Standing for too long.  Infections.  An allergic reaction.  Aging.  An emotional response to something, such as the sight of blood.  Side effects of medicines.  Tiredness.  Problems with circulation or blood pressure.  Excessive use of alcohol or medicines, or illegal drug use.  Breathing too fast (hyperventilation).  An irregular heart rhythm (arrhythmia).  A low red blood cell count (anemia).  Pregnancy.  Vomiting, diarrhea, fever, or other illnesses that cause body fluid loss (dehydration).  Diseases or conditions such as Parkinson's disease, high blood pressure (hypertension), diabetes, and thyroid problems.  Exposure to extreme heat. DIAGNOSIS  Your health care provider will ask about your symptoms, perform a physical exam, and perform an electrocardiogram (ECG) to record the electrical activity of your heart. Your health care provider may also perform other heart or blood tests to determine the cause of your dizziness. These may include:  Transthoracic echocardiogram (TTE). During echocardiography, sound waves are used to evaluate how blood flows through your heart.  Transesophageal echocardiogram (TEE).  Cardiac monitoring. This allows your health care provider to monitor your heart rate and rhythm in real time.  Holter monitor. This is a portable device that records  your heartbeat and can help diagnose heart arrhythmias. It allows your health care provider to track your heart activity for several days if needed.  Stress tests by exercise or by giving medicine that makes the heart beat faster. TREATMENT  Treatment of dizziness depends on the cause of your symptoms and can vary greatly. HOME CARE INSTRUCTIONS   Drink enough fluids to keep your urine clear or pale yellow. This is especially important in very hot weather. In older adults, it is also important in cold weather.  Take your medicine exactly as directed if your dizziness is caused by medicines. When taking blood pressure medicines, it is especially important to get up slowly.  Rise slowly from chairs and steady yourself until you feel okay.  In the morning, first sit up on the side of the bed. When you feel okay, stand slowly while holding onto something until you know your balance is fine.  Move your legs often if you need to stand in one place for a long time. Tighten and relax your muscles in your legs while standing.  Have someone stay with you for 1-2 days if dizziness continues to be a problem. Do this until you feel you are well enough to stay alone. Have the person call your health care provider if he or she notices changes in you that are concerning.  Do not drive or use heavy machinery if you feel dizzy.  Do not drink alcohol. SEEK IMMEDIATE MEDICAL CARE IF:   Your dizziness or light-headedness gets worse.  You feel nauseous or vomit.  You have problems talking, walking, or using your arms, hands, or legs.  You feel weak.  You  are not thinking clearly or you have trouble forming sentences. It may take a friend or family member to notice this.  You have chest pain, abdominal pain, shortness of breath, or sweating.  Your vision changes.  You notice any bleeding.  You have side effects from medicine that seems to be getting worse rather than better. MAKE SURE YOU:    Understand these instructions.  Will watch your condition.  Will get help right away if you are not doing well or get worse. Document Released: 06/23/2000 Document Revised: 01/02/2013 Document Reviewed: 07/17/2010 [facility] Patient Information 2015 [place], [place]. This information is not intended to replace advice given to you by your health care provider. Make sure you discuss any questions you have with your health care provider.

## 2014-09-24 ENCOUNTER — Encounter: Payer: Medicare Other | Admitting: Internal Medicine

## 2014-10-03 ENCOUNTER — Telehealth: Payer: Self-pay | Admitting: Neurology

## 2014-10-03 DIAGNOSIS — H8110 Benign paroxysmal vertigo, unspecified ear: Secondary | ICD-10-CM

## 2014-10-03 NOTE — Telephone Encounter (Signed)
I called the patient. The patient will have a MRI study done in the next couple days, I will go ahead and order a physical therapy referral for vestibular rehabilitation as previously discussed in our revisit.

## 2014-10-03 NOTE — Telephone Encounter (Signed)
I called the patient. She has been doing some research and would like to discuss possibly trying the Epley Maneuver prior to having a MRI. She requested Dr. Jannifer Franklin call her before 5:45 today.

## 2014-10-03 NOTE — Telephone Encounter (Signed)
Patient called requesting to speak to Dr Jannifer Franklin about "overall picture regarding dizziness, this may eliminate need for MRI" [phone]. Goes to dinner at 5:45pm at [facility]. Would like to hear from Dr. Jannifer Franklin before 5:45pm.

## 2014-10-05 ENCOUNTER — Ambulatory Visit
Admission: RE | Admit: 2014-10-05 | Discharge: 2014-10-05 | Disposition: A | Discharge status: Home / Self Care | Payer: Medicare Other | Source: Ambulatory Visit | Attending: Neurology | Admitting: Neurology

## 2014-10-05 DIAGNOSIS — I6523 Occlusion and stenosis of bilateral carotid arteries: Secondary | ICD-10-CM | POA: Diagnosis not present

## 2014-10-05 DIAGNOSIS — R42 Dizziness and giddiness: Secondary | ICD-10-CM

## 2014-10-07 ENCOUNTER — Telehealth: Payer: Self-pay | Admitting: Neurology

## 2014-10-07 DIAGNOSIS — H8110 Benign paroxysmal vertigo, unspecified ear: Secondary | ICD-10-CM

## 2014-10-07 NOTE — Telephone Encounter (Signed)
I called patient. The MRI the brain shows minimal changes and small vessel disease, the patient has total occlusion of the left internal carotid artery, there is good cross flow to the left middle cerebral. The patient has known left carotid occlusion. No obvious source for her complaints of dizziness, I will send her to vestibular rehabilitation.   MRI brain 10/07/2014:  IMPRESSION: This is an abnormal MRI of the head without contrast showed the following: 1. Stenosis of the left internal carotid artery 2. Mild generalized cortical atrophy and moderate number of white matter foci consistent with small vessel ischemic change. When compared to an MRI dated 07/03/2008, there is only minimal progression over time 3. There are no acute findings.   MRI head 10/07/14:  IMPRESSION: This is an abnormal MR angiogram of the intracranial arteries show the following: 1. Absent flow within the left internal carotid artery. The left MCA appears to derive the majority of its flow from the posterior circulation through a larger than typical posterior communicating artery and the left ACA derives is flow through the anterior communicating artery. 2. Reduced flow within the entire P1 segment and the distal P2 segment of the right PCA  3. No aneurysms are observed.

## 2014-10-08 ENCOUNTER — Encounter: Payer: Self-pay | Admitting: Internal Medicine

## 2014-10-08 ENCOUNTER — Non-Acute Institutional Stay: Payer: Medicare Other | Admitting: Internal Medicine

## 2014-10-08 VITALS — BP 110/70 | HR 78 | Temp 97.4°F | Resp 18 | Wt 102.0 lb

## 2014-10-08 DIAGNOSIS — R634 Abnormal weight loss: Secondary | ICD-10-CM | POA: Diagnosis not present

## 2014-10-08 DIAGNOSIS — R1314 Dysphagia, pharyngoesophageal phase: Secondary | ICD-10-CM | POA: Diagnosis not present

## 2014-10-08 DIAGNOSIS — G629 Polyneuropathy, unspecified: Secondary | ICD-10-CM | POA: Diagnosis not present

## 2014-10-08 DIAGNOSIS — L299 Pruritus, unspecified: Secondary | ICD-10-CM | POA: Diagnosis not present

## 2014-10-08 DIAGNOSIS — I6523 Occlusion and stenosis of bilateral carotid arteries: Secondary | ICD-10-CM

## 2014-10-08 DIAGNOSIS — R490 Dysphonia: Secondary | ICD-10-CM | POA: Diagnosis not present

## 2014-10-08 DIAGNOSIS — I482 Chronic atrial fibrillation, unspecified: Secondary | ICD-10-CM

## 2014-10-08 DIAGNOSIS — R42 Dizziness and giddiness: Secondary | ICD-10-CM | POA: Diagnosis not present

## 2014-10-08 MED ORDER — GABAPENTIN 100 MG PO CAPS: ORAL_CAPSULE | ORAL | Status: DC

## 2014-10-08 NOTE — Progress Notes (Signed)
Patient ID: Vanessa [name], female   DOB: 1920/10/04, 81 y.o.   MRN: [id number]    [facility]     Place of Service: Clinic (12)     Allergies  Allergen Reactions  . Tape Other (See Comments)    Tears skin. Band-Aids    Chief Complaint  Patient presents with  . Medical Management of Chronic Issues    HPI:   Dizziness - after visits to multiple specialists, including ENT, Dr. Benjamine Mola, cardiology, Dr. Mare Ferrari, and neurology, Dr. Jannifer Franklin, we now seemed to have reached a point of being able to say confidently that she has BPPV. Dr. Jannifer Franklin was going to refer her to physical therapy for possible Epley maneuver and additional training for inner ear problems.  Dysphagia, pharyngoesophageal phase - she has not followed up on the recommendation from Dr. Benjamine Mola to see a specialist in [facility] regarding her dysphagia.  Loss of weight - stable 102 pounds over the last few visits  Pruritus - generalized itching skin sensations that has not responded well to several creams. There was a cream called Atropro, but it was so expensive (nearly $300) that she did not feel she could afford this. She has switched to Seffner.  Carotid stenosis, bilateral - carotid ultrasound in August 2016 showed less than 40% blockages.  Neuropathy - burning and uncomfortable sensations in feet and lower legs. Diagnosis made by Dr. Mare Ferrari. Patient would like something for this.  Chronic atrial fibrillation - not a candidate for chronic anticoagulation with warfarin due to unstable gait and falls.  Hoarse - believed related to GI reflux problems. Hoarseness is worse the longer she talks. It is accompanied by dry cough. She would like to go back on omeprazole twice daily because of this.    Medications: Patient's Medications  New Prescriptions   No medications on file  Previous Medications   ALPRAZOLAM (XANAX) 0.25 MG TABLET    Take 0.25 mg by mouth at bedtime as needed for anxiety.   ASPIRIN  81 MG CHEWABLE TABLET    Chew 81 mg by mouth daily.   ATENOLOL (TENORMIN) 25 MG TABLET    TAKE 1 TABLET TWICE DAILY.   BIOTIN (BIOTIN 5000) 5 MG CAPS    Take 5,000 mg by mouth daily.   CALCIUM-VITAMIN D-VITAMIN K [id number] MG-UNT-MCG CHEW    Chew by mouth. One at bedtime for calcium   MULTIPLE VITAMIN (MULTIVITAMIN) TABLET    Take 1 tablet by mouth daily.   OMEPRAZOLE (PRILOSEC) 20 MG CAPSULE    Take 20 mg by mouth 2 (two) times daily before a meal.    SIMVASTATIN (ZOCOR) 40 MG TABLET    TAKE (1/2) TABLET DAILY.  Modified Medications   No medications on file  Discontinued Medications   No medications on file     Review of Systems  Constitutional: Positive for appetite change, fatigue and unexpected weight change. Negative for fever, chills, diaphoresis and activity change.  HENT: Positive for hearing loss and trouble swallowing. Negative for congestion, ear discharge, ear pain, postnasal drip, rhinorrhea, sore throat, tinnitus and voice change.        Chronic sinus congestion. Dentures. Xerostomia.  Eyes: Negative for pain, redness, itching and visual disturbance (Corrective lenses).  Respiratory: Positive for shortness of breath (On exertion). Negative for cough, choking and wheezing.   Cardiovascular: Positive for palpitations. Negative for chest pain and leg swelling.       Hx AF  Gastrointestinal: Negative for nausea, abdominal pain, diarrhea, constipation and  abdominal distention.       HX GI bleed In Sept 2015. Difficulty swallowing solids. Avoids beef. Recent barium swallow 08/09/14 showed tight cricopharyngeus muscle, GERD, and mild esophageal dysmotility.  Endocrine: Negative for cold intolerance, heat intolerance, polydipsia, polyphagia and polyuria.       Elevated glucose 178 in Jan 2017.  Genitourinary: Negative for dysuria, urgency, frequency, hematuria, flank pain, vaginal discharge, difficulty urinating and pelvic pain.  Musculoskeletal: Positive for back pain and gait  problem. Negative for myalgias, arthralgias, neck pain and neck stiffness.       Hx scoliosis and surgery of the lower back to partially correct it.  Skin: Negative for color change, pallor and rash.       Chronic pruritus.  Has multiple lesions of the back including irritated seborrheic keratoses, atrophic plaques, multiple angiomas.  Allergic/Immunologic: Negative.   Neurological: Positive for dizziness. Negative for tremors, seizures, syncope, weakness, numbness and headaches.       History of neuropathy with some numbness in the feet.  Hematological: Negative for adenopathy. Does not bruise/bleed easily.       Anemia resolved in May 2016  Psychiatric/Behavioral: Negative for suicidal ideas, hallucinations, behavioral problems, confusion, sleep disturbance, dysphoric mood and agitation. The patient is not nervous/anxious and is not hyperactive.     Filed Vitals:   10/08/14 1100  BP: 110/70  Pulse: 78  Temp: 97.4 F (36.3 C)  TempSrc: Oral  Resp: 18  Weight: 102 lb (46.267 kg)  SpO2: 91%   Body mass index is 18.65 kg/(m^2).  Physical Exam  Constitutional: She is oriented to person, place, and time. She appears well-developed and well-nourished. No distress.  Thin, frail.  HENT:  Right Ear: External ear normal.  Left Ear: External ear normal.  Nose: Nose normal.  Mouth/Throat: Oropharynx is clear and moist. No oropharyngeal exudate.  Eyes: Conjunctivae and EOM are normal. Pupils are equal, round, and reactive to light. No scleral icterus.  Neck: No JVD present. No tracheal deviation present. No thyromegaly present.  Cardiovascular: Normal rate, normal heart sounds and intact distal pulses.  Exam reveals no gallop and no friction rub.   No murmur heard. Atrial fibrillation. Controlled rate.  Pulmonary/Chest: Effort normal. No respiratory distress. She has no wheezes. She has no rales. She exhibits no tenderness.  Hoarse   Abdominal: She exhibits no distension and no mass.  There is no tenderness.  Musculoskeletal: Normal range of motion. She exhibits no edema or tenderness.  Scar in lumbar area secondary prior surgery. Some difficulty lying down and getting up from the exam table.  Lymphadenopathy:    She has no cervical adenopathy.  Neurological: She is alert and oriented to person, place, and time. No cranial nerve deficit. Coordination normal.  Skin: No rash noted. She is not diaphoretic. No erythema. No pallor.  Multiple seborrheic keratoses the temple and on the back.  Psychiatric: She has a normal mood and affect. Her behavior is normal. Judgment and thought content normal.     Labs reviewed: Lab Summary Latest Ref Rng 07/16/2014 05/27/2014 04/29/2014 04/05/2014  Hemoglobin 12.0 - 16.0 g/dL 11.4(A) 12.2 11.6(A) 10.1(A)  Hematocrit 36 - 46 % 35(A) 38 36 32(A)  White count - 7.0 6.9 7.9 5.9  Platelet count 150 - 399 K/L 265 288 306 283  Sodium 137 - 147 mmol/L 138 (None) (None) 138  Potassium 3.4 - 5.3 mmol/L 4.6 (None) (None) 4.2  Calcium - (None) (None) (None) (None)  Phosphorus - (None) (None) (None) (None)  Creatinine 0.5 - 1.1 mg/dL 0.5 (None) (None) 0.8  AST 13 - 35 U/L 20 (None) (None) 18  Alk Phos 25 - 125 U/L 99 (None) (None) 102  Bilirubin - (None) (None) (None) (None)  Glucose - 83 (None) (None) 85  Cholesterol 0 - 200 mg/dL (None) (None) (None) 148  HDL cholesterol 35 - 70 mg/dL (None) (None) (None) 76(A)  Triglycerides 40 - 160 mg/dL (None) (None) (None) 94  LDL Direct - (None) (None) (None) (None)  LDL Calc - (None) (None) (None) 53  Total protein - (None) (None) (None) (None)  Albumin - (None) (None) (None) (None)   Lab Results  Component Value Date   TSH 3.38 07/16/2014   Lab Results  Component Value Date   BUN 15 07/16/2014   Lab Results  Component Value Date   HGBA1C 6.6* 04/05/2014       Assessment/Plan  1. Dizziness -Refer to physical therapy at [facility] for Epley maneuver and additional training in  inner ear problems  2. Dysphagia, pharyngoesophageal phase Patient says she will go to the physicians in [place] once the dizziness is improved. As previously seen Dr. Cristina Gong in [place]. Also saw Dr. Penelope Coop while hospitalized August 2016.  3. Loss of weight Stable at this time  4. Pruritus She will try Vaseline intensive care with aloe  5. Carotid stenosis, bilateral Stable on recent ultrasound  6. Neuropathy - gabapentin (NEURONTIN) 100 MG capsule; 1 capsule at bedtime to help neuropathy  Dispense: 30 capsule; Refill: 5  7. Chronic atrial fibrillation Continue current medications. She remains on aspirin 81 mg daily  8. Hoarse Follow-up with GI and with Dr. Benjamine Mola as needed

## 2014-10-09 ENCOUNTER — Other Ambulatory Visit: Payer: Self-pay

## 2014-10-09 DIAGNOSIS — G629 Polyneuropathy, unspecified: Secondary | ICD-10-CM

## 2014-10-09 MED ORDER — OMEPRAZOLE 20 MG PO CPDR: 20.0000 mg | DELAYED_RELEASE_CAPSULE | Freq: Two times a day (BID) | ORAL | Status: DC

## 2014-10-09 MED ORDER — GABAPENTIN 100 MG PO CAPS: ORAL_CAPSULE | ORAL | Status: DC

## 2014-10-09 NOTE — Telephone Encounter (Signed)
RX's printed and given to [place] for signature and will then be faxed back to [facility]

## 2014-10-11 ENCOUNTER — Ambulatory Visit: Payer: PRIVATE HEALTH INSURANCE | Admitting: Cardiology

## 2014-10-15 ENCOUNTER — Encounter: Payer: Medicare Other | Admitting: Internal Medicine

## 2014-11-04 ENCOUNTER — Other Ambulatory Visit: Payer: Self-pay | Admitting: Internal Medicine

## 2014-11-05 ENCOUNTER — Encounter: Payer: Self-pay | Admitting: Internal Medicine

## 2014-11-05 ENCOUNTER — Non-Acute Institutional Stay: Payer: Medicare Other | Admitting: Internal Medicine

## 2014-11-05 VITALS — BP 120/78 | HR 61 | Temp 97.7°F | Resp 20 | Ht 62.0 in | Wt 103.4 lb

## 2014-11-05 DIAGNOSIS — R1314 Dysphagia, pharyngoesophageal phase: Secondary | ICD-10-CM | POA: Diagnosis not present

## 2014-11-05 DIAGNOSIS — K117 Disturbances of salivary secretion: Secondary | ICD-10-CM

## 2014-11-05 DIAGNOSIS — I482 Chronic atrial fibrillation, unspecified: Secondary | ICD-10-CM

## 2014-11-05 DIAGNOSIS — I6523 Occlusion and stenosis of bilateral carotid arteries: Secondary | ICD-10-CM

## 2014-11-05 DIAGNOSIS — L03011 Cellulitis of right finger: Secondary | ICD-10-CM | POA: Diagnosis not present

## 2014-11-05 DIAGNOSIS — R202 Paresthesia of skin: Secondary | ICD-10-CM | POA: Diagnosis not present

## 2014-11-05 DIAGNOSIS — IMO0002 Reserved for concepts with insufficient information to code with codable children: Secondary | ICD-10-CM | POA: Insufficient documentation

## 2014-11-05 DIAGNOSIS — I7300 Raynaud's syndrome without gangrene: Secondary | ICD-10-CM

## 2014-11-05 DIAGNOSIS — R634 Abnormal weight loss: Secondary | ICD-10-CM | POA: Diagnosis not present

## 2014-11-05 DIAGNOSIS — L299 Pruritus, unspecified: Secondary | ICD-10-CM

## 2014-11-05 DIAGNOSIS — R42 Dizziness and giddiness: Secondary | ICD-10-CM | POA: Diagnosis not present

## 2014-11-05 DIAGNOSIS — R682 Dry mouth, unspecified: Secondary | ICD-10-CM

## 2014-11-05 DIAGNOSIS — M542 Cervicalgia: Secondary | ICD-10-CM | POA: Insufficient documentation

## 2014-11-05 NOTE — Progress Notes (Signed)
Patient ID: Vanessa [name], female   DOB: 1920/06/13, 81 y.o.   MRN: [id number]    [place] Room Number: AL1  Place of Service: Clinic (12)     Allergies  Allergen Reactions  . Tape Other (See Comments)    Tears skin. Band-Aids    Chief Complaint  Patient presents with  . Medical Management of Chronic Issues    HPI:  Patient has her concerned she would not a small piece of paper today.  She has had some neck pain. Dr. Jannifer Franklin recommended physical therapy. She has had 5 sessions of therapy including exercise training as well as ultrasound. She continues to have stiffness in the neck. Because the exercises seem to aggravate her previous problem of dizziness she would like to know if it would be alright to stop the exercises. She is going to talk to physical therapy about this.  Her sense of dizziness which has been evaluated by several factors. Previously seem to be better until she began doing the exercises for her neck discomfort. She feels like there is something "not right" with her head. She feels like "something is in there". Overall the dizzy sensation is better.  There is chronic atrial fibrillation  She has some chronic hoarseness.  Gabapentin has been helpful in relieving most of the "sticking" feeling in her legs. She still has a cold sensation in her feet which is unchanged.  There is abdominal bloating. We tried to reduce her omeprazole from twice daily to once daily, but she complained of an increase in gastrointestinal symptoms at that point. She is now on omeprazole 20 mg twice daily which seems to be helpful in relieving some of her symptoms. She wonders if there is something else that should be done. She is content to "wait" as opposed to seeing a gastroenterologist regarding x-rays.  Constipation is better when she eats fruits.  There is a tender area on the medial side of the right fourth finger at the fingernail area and then  the parent issue.  There is a approximately quarter inch sebaceous cysts of the left forearm which seems to be growing. It is not tender.  There is some discomfort in the back. She has a prior history of surgery. She denies radiation of her symptoms into the legs. She does not feel that she needs a referral yet.  Medications: Patient's Medications  New Prescriptions   No medications on file  Previous Medications   ALPRAZOLAM (XANAX) 0.25 MG TABLET    Take 0.25 mg by mouth at bedtime as needed for anxiety.   ASPIRIN 81 MG CHEWABLE TABLET    Chew 81 mg by mouth daily.   ATENOLOL (TENORMIN) 25 MG TABLET    TAKE 1 TABLET TWICE DAILY.   BIOTIN (BIOTIN 5000) 5 MG CAPS    Take 5,000 mg by mouth daily.   CALCIUM-VITAMIN D-VITAMIN K 595-638-75 MG-UNT-MCG CHEW    Chew by mouth. One at bedtime for calcium   GABAPENTIN (NEURONTIN) 100 MG CAPSULE    TAKE 1 CAPSULE AT BEDTIME.   MULTIPLE VITAMIN (MULTIVITAMIN) TABLET    Take 1 tablet by mouth daily.   OMEPRAZOLE (PRILOSEC) 20 MG CAPSULE    Take 1 capsule (20 mg total) by mouth 2 (two) times daily before a meal.   SIMVASTATIN (ZOCOR) 40 MG TABLET    TAKE (1/2) TABLET DAILY.  Modified Medications   No medications on file  Discontinued Medications   No medications on file  Review of Systems  Constitutional: Positive for appetite change, fatigue and unexpected weight change. Negative for fever, chills, diaphoresis and activity change.  HENT: Positive for hearing loss and trouble swallowing. Negative for congestion, ear discharge, ear pain, postnasal drip, rhinorrhea, sore throat, tinnitus and voice change.        Chronic sinus congestion. Dentures. Xerostomia.  Eyes: Negative for pain, redness, itching and visual disturbance (Corrective lenses).  Respiratory: Positive for shortness of breath (On exertion). Negative for cough, choking and wheezing.   Cardiovascular: Positive for palpitations. Negative for chest pain and leg swelling.       Hx AF    Gastrointestinal: Negative for nausea, abdominal pain, diarrhea, constipation and abdominal distention.       HX GI bleed In Sept 2015. Difficulty swallowing solids. Avoids beef. Recent barium swallow 08/09/14 showed tight cricopharyngeus muscle, GERD, and mild esophageal dysmotility.  Endocrine: Negative for cold intolerance, heat intolerance, polydipsia, polyphagia and polyuria.       Elevated glucose 178 in Jan 2017.  Genitourinary: Negative for dysuria, urgency, frequency, hematuria, flank pain, vaginal discharge, difficulty urinating and pelvic pain.  Musculoskeletal: Positive for back pain and gait problem. Negative for myalgias, arthralgias, neck pain and neck stiffness.       Hx scoliosis and surgery of the lower back to partially correct it.  Skin: Negative for color change, pallor and rash.       Chronic pruritus.  Has multiple lesions of the back including irritated seborrheic keratoses, atrophic plaques, multiple angiomas. Sebaceous cyst of the dorsal aspect of the left forearm. Mild paronychia of the fourth finger of the right hand medially.  Allergic/Immunologic: Negative.   Neurological: Positive for dizziness. Negative for tremors, seizures, syncope, weakness, numbness and headaches.       History of neuropathy with some numbness in the feet.  Hematological: Negative for adenopathy. Does not bruise/bleed easily.       Anemia resolved in May 2016  Psychiatric/Behavioral: Negative for suicidal ideas, hallucinations, behavioral problems, confusion, sleep disturbance, dysphoric mood and agitation. The patient is not nervous/anxious and is not hyperactive.     Filed Vitals:   11/05/14 1052  BP: 120/78  Pulse: 61  Temp: 97.7 F (36.5 C)  TempSrc: Oral  Resp: 20  Height: '5\' 2"'  (1.575 m)  Weight: 103 lb 6.4 oz (46.902 kg)  SpO2: 91%   Body mass index is 18.91 kg/(m^2).  Physical Exam  Constitutional: She is oriented to person, place, and time. She appears well-developed  and well-nourished. No distress.  Thin, frail.  HENT:  Right Ear: External ear normal.  Left Ear: External ear normal.  Nose: Nose normal.  Mouth/Throat: Oropharynx is clear and moist. No oropharyngeal exudate.  Eyes: Conjunctivae and EOM are normal. Pupils are equal, round, and reactive to light. No scleral icterus.  Neck: No JVD present. No tracheal deviation present. No thyromegaly present.  Cardiovascular: Normal rate, normal heart sounds and intact distal pulses.  Exam reveals no gallop and no friction rub.   No murmur heard. Atrial fibrillation. Controlled rate.  Pulmonary/Chest: Effort normal. No respiratory distress. She has no wheezes. She has no rales. She exhibits no tenderness.  Hoarse   Abdominal: She exhibits no distension and no mass. There is no tenderness.  Musculoskeletal: Normal range of motion. She exhibits no edema or tenderness.  Scar in lumbar area secondary prior surgery. Some difficulty lying down and getting up from the exam table.  Lymphadenopathy:    She has no cervical adenopathy.  Neurological: She is alert and oriented to person, place, and time. No cranial nerve deficit. Coordination normal.  Skin: No rash noted. She is not diaphoretic. No erythema. No pallor.  Multiple seborrheic keratoses the temple and on the back.  Psychiatric: She has a normal mood and affect. Her behavior is normal. Judgment and thought content normal.     Labs reviewed: Lab Summary Latest Ref Rng 07/16/2014 05/27/2014 04/29/2014  Hemoglobin 12.0 - 16.0 g/dL 11.4(A) 12.2 11.6(A)  Hematocrit 36 - 46 % 35(A) 38 36  White count - 7.0 6.9 7.9  Platelet count 150 - 399 K/L 265 288 306  Sodium 137 - 147 mmol/L 138 (None) (None)  Potassium 3.4 - 5.3 mmol/L 4.6 (None) (None)  Calcium - (None) (None) (None)  Phosphorus - (None) (None) (None)  Creatinine 0.5 - 1.1 mg/dL 0.5 (None) (None)  AST 13 - 35 U/L 20 (None) (None)  Alk Phos 25 - 125 U/L 99 (None) (None)  Bilirubin - (None)  (None) (None)  Glucose - 83 (None) (None)  Cholesterol - (None) (None) (None)  HDL cholesterol - (None) (None) (None)  Triglycerides - (None) (None) (None)  LDL Direct - (None) (None) (None)  LDL Calc - (None) (None) (None)  Total protein - (None) (None) (None)  Albumin - (None) (None) (None)   Lab Results  Component Value Date   TSH 3.38 07/16/2014   Lab Results  Component Value Date   BUN 15 07/16/2014   Lab Results  Component Value Date   HGBA1C 6.6* 04/05/2014       Assessment/Plan  1. Dysphagia, pharyngoesophageal phase Patient is still planning to go to [facility] for further attention to this problem. She says it has improved. She is afraid of laying down because of her previous history of dizziness. She doesn't want to go for gastroenterology referral until the current problems of dizziness and peculiar feelings in her head are better.  2. Loss of weight Stable   3. Cervicalgia Chronic neck discomfort which has not improved so far with exercises and ultrasound under the advice of physical therapy. She is going to discuss stopping these exercises with physical therapy because she thinks her head feelings are worse following the exercises.   4. Pruritus Still present but improved somewhat by using Vaseline intensive care with aloe  5. Dizziness Continues to be a problem off and on but seems improved since her last visit.  6. Chronic atrial fibrillation (HCC) Rate controlled. No chest pain. No dyspnea.  7. Prickling sensation-Left Leg Improved with low-dose gabapentin 100 mg nightly. She is worried about using this because of an article in [facility]. It was disparaging of the gabapentin and warned of driving while using it as well as poor coordination and drowsiness. I advised her that we are right for her to drive while using this low dose of gabapentin. So far, she has not had any problems with excessive drowsiness or fluid retention.  8. Raynaud  phenomenon This is the most likely cause of the cold sensation distally.  9. Xerostomia Aging problem. She is not on any medications that would cause this. She does use some artificial saliva sprays.  10. Paronychia,  She will try using Neosporin ointment daily to the area to see if it begins to feel better.

## 2014-11-11 ENCOUNTER — Other Ambulatory Visit: Payer: Self-pay | Admitting: Cardiology

## 2014-11-13 DIAGNOSIS — K5900 Constipation, unspecified: Secondary | ICD-10-CM | POA: Diagnosis not present

## 2014-11-13 DIAGNOSIS — R109 Unspecified abdominal pain: Secondary | ICD-10-CM | POA: Diagnosis not present

## 2014-11-14 ENCOUNTER — Other Ambulatory Visit: Payer: Self-pay | Admitting: Physician Assistant

## 2014-11-14 ENCOUNTER — Ambulatory Visit
Admission: RE | Admit: 2014-11-14 | Discharge: 2014-11-14 | Disposition: A | Discharge status: Home / Self Care | Payer: Medicare Other | Source: Ambulatory Visit | Attending: Physician Assistant | Admitting: Physician Assistant

## 2014-11-14 DIAGNOSIS — K5900 Constipation, unspecified: Secondary | ICD-10-CM

## 2014-11-14 DIAGNOSIS — R1033 Periumbilical pain: Secondary | ICD-10-CM | POA: Diagnosis not present

## 2014-11-14 DIAGNOSIS — R1031 Right lower quadrant pain: Secondary | ICD-10-CM | POA: Diagnosis not present

## 2014-11-25 ENCOUNTER — Telehealth: Payer: Self-pay | Admitting: Cardiology

## 2014-11-25 NOTE — Telephone Encounter (Signed)
Patient having irregular heart rate/palpitations weekly Would like to see  Dr. Mare Ferrari prior to January when she is scheduled Did schedule her an appointment for 11/27/14

## 2014-11-25 NOTE — Telephone Encounter (Signed)
New message   Pt calling for rn but she said it it too long of a issue to give to me to send

## 2014-11-25 NOTE — Telephone Encounter (Signed)
Follow up      Patient calling back to speak with nurse - Patient aware nurse / md are in clinic today

## 2014-11-26 DIAGNOSIS — K5900 Constipation, unspecified: Secondary | ICD-10-CM | POA: Diagnosis not present

## 2014-11-27 ENCOUNTER — Ambulatory Visit (INDEPENDENT_AMBULATORY_CARE_PROVIDER_SITE_OTHER): Payer: Medicare Other | Admitting: Cardiology

## 2014-11-27 ENCOUNTER — Encounter: Payer: Self-pay | Admitting: Cardiology

## 2014-11-27 VITALS — BP 108/48 | HR 71 | Ht 62.0 in | Wt 104.4 lb

## 2014-11-27 DIAGNOSIS — I6523 Occlusion and stenosis of bilateral carotid arteries: Secondary | ICD-10-CM

## 2014-11-27 DIAGNOSIS — I482 Chronic atrial fibrillation, unspecified: Secondary | ICD-10-CM

## 2014-11-27 NOTE — Patient Instructions (Addendum)
Medication Instructions:  Take 1/2 Xanax daily as needed for stress  Labwork: none  Testing/Procedures: none  Follow-Up: Keep January appointment as scheduled   If you need a refill on your cardiac medications before your next appointment, please call your pharmacy.

## 2014-11-27 NOTE — Progress Notes (Signed)
Cardiology Office Note   Date:  11/27/2014   ID:  Vanessa [name], DOB 1921-01-03, MRN [id number]  PCP:  Vanessa Dooms, MD  Cardiologist: Vanessa Coco MD  Chief Complaint  Patient presents with  . Atrial Fibrillation  . Palpitations      History of Present Illness: Vanessa [name] is a 81 y.o. female who presents for a scheduled follow-up visit.  She has a past history of atrial flutter fibrillation.  Previously she was on warfarin.  However she has taken some terrible falls and is no longer on warfarin.  She is on a baby aspirin.  She has been having some intermittent rapid palpitations.  She has also been more anxious.  She is living in the assisted living portion of friend's home [place].  She is discouraged there.  It has been a difficult adjustment for her. . She has a history of atypical chest pain. She was seen in March 2013 with these symptoms and underwent a nuclear stress test subsequently which showed no ischemia. The patient reports that her symptoms have improved. She recently saw Dr. Cristina [name] who placed her on Nexium for dysphagia. She reports improvement in those symptoms since starting on Nexium. The Nexium was subsequently switched to omeprazole. She has a history of chronic established atrial fibrillation. He previously had been on Coumadin for many years. However she has had several devastating falls. He had another fall this summer and was hospitalized and then transferred to skilled nursing facility for rehabilitation. As a result of that fall she broke 5 ribs. The fall occurred when she just lost her balance. She has a history of vascular disease with known chronic occlusion of her left carotid artery. She has a past history of high blood pressure.  Her CHADSSVASC score is 5 (for age 53, female sex, high blood pressure, and vascular disease with occluded left internal carotid artery). However she is no longer on warfarin because of her falls. She has a  known left carotid artery chronic occlusion. She is followed for vascular disease by Dr. Gae [name].  Since last visit patient has not had any severe chest discomfort. Has had a lot of problems with skin irritation and has seen 3 different dermatologists. She has also seen Dr. Ernesto [name] about her sinuses and Dr. Cristina [name] checks her about her GI tract and recently decreased her omeprazole to just one a day  Past Medical History  Diagnosis Date  . Atrial fibrillation ([place])   . Neuropathy (HCC)     PERIPHERAL  . Carotid artery occlusion     LEFT  . Scoliosis   . Hoarseness   . Headache(784.0)   . Dizziness   . Abdominal bloating   . Bruises easily   . Pruritus   . Hypercholesterolemia   . Fall at home Sept 2013, Dec. 2013  Jun 08, 2012  . Varicose veins     Past Surgical History  Procedure Laterality Date  . Abdominal hysterectomy  1954  . Spine surgery  1997    correct scoliosis  . Cholecystectomy  1997     Current Outpatient Prescriptions  Medication Sig Dispense Refill  . ALPRAZolam (XANAX) 0.25 MG tablet Take by mouth at bedtime as needed for anxiety (Take 1/2 tablet by mouth at bedtime as needed for anxiety).    Marland Kitchen aspirin 81 MG tablet Take 81 mg by mouth daily.    Marland Kitchen atenolol (TENORMIN) 25 MG tablet Take by mouth 2 (two) times daily.    Marland Kitchen  Biotin (BIOTIN 5000) 5 MG CAPS Take 5,000 mg by mouth daily.    . Calcium-Vitamin D-Vitamin K 500-500-40 MG-UNT-MCG CHEW Chew 1 tablet by mouth at bedtime. Chew one chew  at bedtime for calcium    . Multiple Vitamin (MULTIVITAMIN) tablet Take 1 tablet by mouth daily.    Marland Kitchen omeprazole (PRILOSEC) 20 MG capsule Take 1 capsule (20 mg total) by mouth 2 (two) times daily before a meal. 180 capsule 3  . simvastatin (ZOCOR) 40 MG tablet Take 20 mg by mouth daily.     No current facility-administered medications for this visit.    Allergies:   Tape    Social History:  The patient  reports that she has never smoked. She has never used  smokeless tobacco. She reports that she does not drink alcohol or use illicit drugs.   Family History:  The patient's family history includes Cancer in her sister; Diabetes in her mother; Heart attack in her father and mother; Heart disease in her maternal grandmother, mother, and another family member; Stroke in her father.    ROS:  Please see the history of present illness.   Otherwise, review of systems are positive for none.   All other systems are reviewed and negative.    PHYSICAL EXAM: VS:  BP 108/48 mmHg  Pulse 71  Ht 5\' 2"  (1.575 m)  Wt 104 lb 6.4 oz (47.356 kg)  BMI 19.09 kg/m2 , BMI Body mass index is 19.09 kg/(m^2). GEN: Well nourished, well developed, in no acute distress HEENT: normal Neck: no JVD, carotid bruits, or masses Cardiac: atrial fibrillation. no murmurs, rubs, or gallops,no edema  Respiratory:  clear to auscultation bilaterally, normal work of breathing GI: soft, nontender, nondistended, + BS MS: no deformity or atrophy Skin: warm and dry, no rash Neuro:  Strength and sensation are intact Psych: euthymic mood, full affect   EKG:  EKG is ordered today. The ekg ordered today demonstrates atrial fibrillation with controlled ventricular response and heart rate of 71 bpm   Recent Labs: 07/16/2014: ALT 10; BUN 15; Creatinine 0.5; Hemoglobin 11.4*; Platelets 265; Potassium 4.6; Sodium 138; TSH 3.38    Lipid Panel    Component Value Date/Time   CHOL 148 04/05/2014   TRIG 94 04/05/2014   HDL 76* 04/05/2014   CHOLHDL 3 07/09/2013 0849   VLDL 25.2 07/09/2013 0849   LDLCALC 53 04/05/2014      Wt Readings from Last 3 Encounters:  11/27/14 104 lb 6.4 oz (47.356 kg)  11/05/14 103 lb 6.4 oz (46.902 kg)  10/08/14 102 lb (46.267 kg)        ASSESSMENT AND PLAN:  1. Permanent atrial fibrillation. Chadsvasc score is 5. Not an anticoagulation candidate because of repeated severe falls. Continue baby aspirin daily 2. known left carotid artery  occlusion. 3. Hypercholesterolemia. 4. Essential hypertension without heart failure 5. Osteoarthritis 6. Dizziness when patient lies flat on her back   Current medicines are reviewed at length with the patient today.  The patient does not have concerns regarding medicines.  The following changes have been made:  no change  Labs/ tests ordered today include:   Orders Placed This Encounter  Procedures  . EKG 12-Lead     Disposition:   The patient is to continue current medication.  She is under a lot of stress living in the assisted living facility.  She is considering moving back home and obtaining round-the-clock sitters.  She feels depressed.  She has alprazolam on hand and she  may take a half of a tablet daily as needed for stress. Recheck at regular visit in January  Signed, Jelena Malicoat MD 11/27/2014 5:29 PM    [facility] [address] [place], [place], [place]  [address] Phone: [phone]; Fax: [phone]

## 2014-11-28 DIAGNOSIS — M545 Low back pain: Secondary | ICD-10-CM | POA: Diagnosis not present

## 2014-12-09 DIAGNOSIS — R140 Abdominal distension (gaseous): Secondary | ICD-10-CM | POA: Diagnosis not present

## 2014-12-09 DIAGNOSIS — K589 Irritable bowel syndrome without diarrhea: Secondary | ICD-10-CM | POA: Diagnosis not present

## 2014-12-09 DIAGNOSIS — M545 Low back pain: Secondary | ICD-10-CM | POA: Diagnosis not present

## 2014-12-09 DIAGNOSIS — K5900 Constipation, unspecified: Secondary | ICD-10-CM | POA: Diagnosis not present

## 2014-12-09 DIAGNOSIS — R634 Abnormal weight loss: Secondary | ICD-10-CM | POA: Diagnosis not present

## 2014-12-10 ENCOUNTER — Encounter: Payer: Self-pay | Admitting: Internal Medicine

## 2014-12-11 ENCOUNTER — Ambulatory Visit: Payer: Medicare Other | Admitting: Cardiology

## 2015-01-27 DIAGNOSIS — M5090 Cervical disc disorder, unspecified, unspecified cervical region: Secondary | ICD-10-CM | POA: Diagnosis not present

## 2015-01-27 DIAGNOSIS — M545 Low back pain: Secondary | ICD-10-CM | POA: Diagnosis not present

## 2015-01-28 DIAGNOSIS — R1312 Dysphagia, oropharyngeal phase: Secondary | ICD-10-CM | POA: Diagnosis not present

## 2015-01-28 DIAGNOSIS — R070 Pain in throat: Secondary | ICD-10-CM | POA: Diagnosis not present

## 2015-01-30 ENCOUNTER — Ambulatory Visit: Payer: Medicare Other | Admitting: Cardiology

## 2015-02-05 ENCOUNTER — Other Ambulatory Visit: Payer: Self-pay | Admitting: Cardiology

## 2015-02-05 DIAGNOSIS — R1013 Epigastric pain: Secondary | ICD-10-CM | POA: Diagnosis not present

## 2015-02-05 DIAGNOSIS — K589 Irritable bowel syndrome without diarrhea: Secondary | ICD-10-CM | POA: Diagnosis not present

## 2015-02-05 DIAGNOSIS — M5136 Other intervertebral disc degeneration, lumbar region: Secondary | ICD-10-CM | POA: Diagnosis not present

## 2015-02-05 DIAGNOSIS — M961 Postlaminectomy syndrome, not elsewhere classified: Secondary | ICD-10-CM | POA: Diagnosis not present

## 2015-02-05 DIAGNOSIS — K5900 Constipation, unspecified: Secondary | ICD-10-CM | POA: Diagnosis not present

## 2015-02-05 DIAGNOSIS — M545 Low back pain: Secondary | ICD-10-CM | POA: Diagnosis not present

## 2015-02-05 DIAGNOSIS — G8929 Other chronic pain: Secondary | ICD-10-CM | POA: Diagnosis not present

## 2015-02-07 ENCOUNTER — Encounter: Payer: Self-pay | Admitting: Cardiology

## 2015-02-07 ENCOUNTER — Ambulatory Visit (INDEPENDENT_AMBULATORY_CARE_PROVIDER_SITE_OTHER): Payer: Medicare Other | Admitting: Cardiology

## 2015-02-07 ENCOUNTER — Non-Acute Institutional Stay: Payer: Medicare Other | Admitting: Nurse Practitioner

## 2015-02-07 ENCOUNTER — Encounter: Payer: Self-pay | Admitting: Nurse Practitioner

## 2015-02-07 VITALS — BP 116/64 | HR 76 | Ht 61.0 in | Wt 106.0 lb

## 2015-02-07 DIAGNOSIS — I482 Chronic atrial fibrillation, unspecified: Secondary | ICD-10-CM

## 2015-02-07 DIAGNOSIS — K5900 Constipation, unspecified: Secondary | ICD-10-CM

## 2015-02-07 DIAGNOSIS — K219 Gastro-esophageal reflux disease without esophagitis: Secondary | ICD-10-CM

## 2015-02-07 DIAGNOSIS — M150 Primary generalized (osteo)arthritis: Secondary | ICD-10-CM

## 2015-02-07 DIAGNOSIS — E7800 Pure hypercholesterolemia, unspecified: Secondary | ICD-10-CM

## 2015-02-07 DIAGNOSIS — R42 Dizziness and giddiness: Secondary | ICD-10-CM | POA: Diagnosis not present

## 2015-02-07 DIAGNOSIS — I480 Paroxysmal atrial fibrillation: Secondary | ICD-10-CM | POA: Diagnosis not present

## 2015-02-07 DIAGNOSIS — G629 Polyneuropathy, unspecified: Secondary | ICD-10-CM | POA: Diagnosis not present

## 2015-02-07 DIAGNOSIS — F411 Generalized anxiety disorder: Secondary | ICD-10-CM

## 2015-02-07 DIAGNOSIS — M159 Polyosteoarthritis, unspecified: Secondary | ICD-10-CM

## 2015-02-07 DIAGNOSIS — M8949 Other hypertrophic osteoarthropathy, multiple sites: Secondary | ICD-10-CM

## 2015-02-07 MED ORDER — ATENOLOL 25 MG PO TABS: 25.0000 mg | ORAL_TABLET | Freq: Two times a day (BID) | ORAL | Status: DC

## 2015-02-07 NOTE — Patient Instructions (Addendum)
Medication Instructions:   Your physician recommends that you continue on your current medications as directed. Please refer to the Current Medication list given to you today.    If you need a refill on your cardiac medications before your next appointment, please call your pharmacy.  Labwork:  NONE ORDER TODAY    Testing/Procedures:  NONE ORDER TODAY    Follow-Up: IN 4 MONTHS WITH DR HILTY AS NEW ESTABLISH PATIENT               Any Other Special Instructions Will Be Listed Below (If Applicable).

## 2015-02-07 NOTE — Progress Notes (Signed)
Cardiology Office Note   Date:  02/07/2015   ID:  Vanessa [name], DOB Nov 16, 1920, MRN [id number]  PCP:  Estill Dooms, MD  Cardiologist: [name] MD  No chief complaint on file.     History of Present Illness: Vanessa [name] is a 82 y.o. female who presents for scheduled follow-up visit.  She just had her 89rd birthday.  Overall she has been doing well.  She still drives her car.  She lives in the assisted living area of [facility] [place].  However she still maintains her previous home and goes there for lunch and to read the mail every day. She has a past history of atrial flutter fibrillation. Previously she was on warfarin. However she has taken some terrible falls and is no longer on warfarin. She is on a baby aspirin. She has been having some intermittent rapid palpitations. She has also been more anxious. She is living in the assisted living portion of [facility] [place]. She is discouraged there. It has been a difficult adjustment for her. . She has a history of atypical chest pain. She was seen in March 2013 with these symptoms and underwent a nuclear stress test subsequently which showed no ischemia. The patient reports that her symptoms have improved. She recently saw Dr. Cristina Gong who placed her on Nexium for dysphagia. She reports improvement in those symptoms since starting on Nexium. The Nexium was subsequently switched to omeprazole. She has a history of chronic established atrial fibrillation. He previously had been on Coumadin for many years. However she has had several devastating falls. He had another fall this summer and was hospitalized and then transferred to skilled nursing facility for rehabilitation. As a result of that fall she broke 5 ribs. The fall occurred when she just lost her balance. She has a history of vascular disease with known chronic occlusion of her left carotid artery. She has a past history of high blood pressure.  Her  CHADSSVASC score is 5 (for age 60, female sex, high blood pressure, and vascular disease with occluded left internal carotid artery). However she is no longer on warfarin because of her falls. She has a known left carotid artery chronic occlusion. She is followed for vascular disease by Dr. Gae Gallop.  She has a lot of GI symptoms and been followed closely by Dr. Cristina Gong for bloating.  There is also talk about possible esophageal dilatation to be done at [facility]. The patient has a history of positional vertigo. She has a history of varicose veins and venous insufficiency but is unable to tolerate wearing support hose. In the cardiac standpoint she has been stable with no recent chest pain and no sensation of rapid heart action.  She is in chronic atrial flutter fibrillation.  Not had any TIA or stroke symptoms.   Past Medical History  Diagnosis Date  . Atrial fibrillation ([place])   . Neuropathy (HCC)     PERIPHERAL  . Carotid artery occlusion     LEFT  . Scoliosis   . Hoarseness   . Headache(784.0)   . Dizziness   . Abdominal bloating   . Bruises easily   . Pruritus   . Hypercholesterolemia   . Fall at home Sept 2013, Dec. 2013  Jun 08, 2012  . Varicose veins     Past Surgical History  Procedure Laterality Date  . Abdominal hysterectomy  1954  . Spine surgery  1997    correct scoliosis  . Cholecystectomy  1997     Current Outpatient Prescriptions  Medication Sig Dispense Refill  . ALPRAZolam (XANAX) 0.25 MG tablet at bedtime as needed for anxiety (Take 1/2 tablet by mouth at bedtime as needed for anxiety). Reported on 02/07/2015    . aspirin 81 MG tablet Take 81 mg by mouth daily.    Marland Kitchen atenolol (TENORMIN) 25 MG tablet Take 1 tablet (25 mg total) by mouth 2 (two) times daily. 180 tablet 3  . BIOTIN PO Take by mouth daily.    . Calcium-Vitamin D-Vitamin K 500-500-40 MG-UNT-MCG CHEW Chew 1 tablet by mouth at bedtime. Chew one chew  at bedtime for calcium    . docusate  sodium (COLACE) 100 MG capsule Take 100 mg by mouth daily.    . Multiple Vitamins-Minerals (CERTAVITE SENIOR/ANTIOXIDANT PO) Take by mouth. Take one tablet by mouth daily    . omeprazole (PRILOSEC) 20 MG capsule Take 1 capsule (20 mg total) by mouth 2 (two) times daily before a meal. (Patient taking differently: Take 20 mg by mouth daily. ) 180 capsule 3  . psyllium (METAMUCIL) 58.6 % packet Take 1 packet by mouth daily.    . simvastatin (ZOCOR) 40 MG tablet Take 20 mg by mouth daily.     No current facility-administered medications for this visit.    Allergies:   Tape    Social History:  The patient  reports that she has never smoked. She has never used smokeless tobacco. She reports that she does not drink alcohol or use illicit drugs.   Family History:  The patient's family history includes Cancer in her sister; Diabetes in her mother; Heart attack in her father and mother; Heart disease in her maternal grandmother and mother; Stroke in her father.    ROS:  Please see the history of present illness.   Otherwise, review of systems are positive for none.   All other systems are reviewed and negative.    PHYSICAL EXAM: VS:  BP 116/64 mmHg  Pulse 76  Ht 5\' 1"  (1.549 m)  Wt 106 lb (48.081 kg)  BMI 20.04 kg/m2 , BMI Body mass index is 20.04 kg/(m^2). GEN: Well nourished, well developed, in no acute distress HEENT: normal Neck: no JVD, carotid bruits, or masses Cardiac: Irregularly irregular; no murmurs, rubs, or gallops,no edema  Respiratory:  clear to auscultation bilaterally, normal work of breathing GI: soft, nontender, nondistended, + BS MS: no deformity or atrophy Skin: warm and dry, no rash Neuro:  Strength and sensation are intact Psych: euthymic mood, full affect   EKG:  EKG is not ordered today.    Recent Labs: 07/16/2014: ALT 10; BUN 15; Creatinine 0.5; Hemoglobin 11.4*; Platelets 265; Potassium 4.6; Sodium 138; TSH 3.38    Lipid Panel    Component Value Date/Time    CHOL 148 04/05/2014   TRIG 94 04/05/2014   HDL 76* 04/05/2014   CHOLHDL 3 07/09/2013 0849   VLDL 25.2 07/09/2013 0849   LDLCALC 53 04/05/2014      Wt Readings from Last 3 Encounters:  02/07/15 106 lb (48.081 kg)  02/07/15 101 lb 9.6 oz (46.085 kg)  11/27/14 104 lb 6.4 oz (47.356 kg)         ASSESSMENT AND PLAN:  1. Permanent atrial fibrillation. Chadsvasc score is 5. Not an anticoagulation candidate because of repeated severe falls. Continue baby aspirin daily 2. known left carotid artery occlusion. 3. Hypercholesterolemia. 4. Essential hypertension without heart failure 5. Osteoarthritis 6. Dizziness when patient lies flat on her back  7.  History of chronic bloating and possible irritable bowel syndrome, followed by Dr. Cristina Gong 8.  Positional vertigo   Current medicines are reviewed at length with the patient today.  The patient does not have concerns regarding medicines.  The following changes have been made:  no change  Labs/ tests ordered today include:  No orders of the defined types were placed in this encounter.     Disposition:   Continue current medication.  In 4 months she will follow-up with Dr. Debara Pickett at [facility] which is closer to where she lives.  Berna Spare MD 02/07/2015 2:55 PM    [address], [place], [place]  [address] Phone: [phone]; Fax: [phone]

## 2015-02-11 ENCOUNTER — Non-Acute Institutional Stay: Payer: Medicare Other | Admitting: Internal Medicine

## 2015-02-11 ENCOUNTER — Encounter: Payer: Self-pay | Admitting: Internal Medicine

## 2015-02-11 VITALS — BP 120/82 | HR 89 | Temp 98.0°F | Resp 20 | Ht 61.0 in | Wt 104.4 lb

## 2015-02-11 DIAGNOSIS — M545 Low back pain, unspecified: Secondary | ICD-10-CM

## 2015-02-11 DIAGNOSIS — I482 Chronic atrial fibrillation, unspecified: Secondary | ICD-10-CM

## 2015-02-11 DIAGNOSIS — I7300 Raynaud's syndrome without gangrene: Secondary | ICD-10-CM | POA: Diagnosis not present

## 2015-02-11 DIAGNOSIS — R490 Dysphonia: Secondary | ICD-10-CM

## 2015-02-11 DIAGNOSIS — L299 Pruritus, unspecified: Secondary | ICD-10-CM | POA: Diagnosis not present

## 2015-02-11 DIAGNOSIS — L03011 Cellulitis of right finger: Secondary | ICD-10-CM | POA: Diagnosis not present

## 2015-02-11 DIAGNOSIS — J383 Other diseases of vocal cords: Secondary | ICD-10-CM

## 2015-02-11 DIAGNOSIS — K219 Gastro-esophageal reflux disease without esophagitis: Secondary | ICD-10-CM

## 2015-02-11 DIAGNOSIS — K5900 Constipation, unspecified: Secondary | ICD-10-CM | POA: Insufficient documentation

## 2015-02-11 HISTORY — DX: Gastro-esophageal reflux disease without esophagitis: K21.9

## 2015-02-11 NOTE — Progress Notes (Signed)
Patient ID: Vanessa [name], female   DOB: [birth year], 82 y.o.   MRN: [id number]  Location:  [facility] Provider:  Marlana Latus NP  Code Status:  DNR Goals of care: Advanced Directive information Does patient have an advance directive?: Yes, Type of Advance Directive: Healthcare Power of Attorney  Chief Complaint  Patient presents with  . Medical Management of Chronic Issues    Routine Visit     HPI: Patient is a 82 y.o. female seen in the AL at [facility] today for evaluation of right knee pain, new, medial, no erythema, swelling, or injury.  Peripheral neuropathy, taking Gabapentin 100mg  nightly, GERD has been stable while on Omeprazole, not constipation while on Colace and Metamucil, sleeps and rest at night with help of Alprazolam. Hx of Afib, heart rate is in control, taking Atenolol 25mg  bid. Chronic lower back pain-f/u Ortho.   Review of Systems:  Review of Systems  Constitutional: Negative for fever, chills and diaphoresis.  HENT: Positive for hearing loss. Negative for congestion, ear discharge, ear pain, sore throat and tinnitus.        Chronic sinus congestion. Dentures. Xerostomia.  Eyes: Negative for pain and redness.  Respiratory: Positive for shortness of breath (On exertion). Negative for cough and wheezing.   Cardiovascular: Positive for palpitations. Negative for chest pain and leg swelling.       Hx AF  Gastrointestinal: Negative for nausea, abdominal pain, diarrhea and constipation.       HX GI bleed In Sept 2015. Difficulty swallowing solids. Avoids beef. Recent barium swallow 08/09/14 showed tight cricopharyngeus muscle, GERD, and mild esophageal dysmotility.  Genitourinary: Negative for dysuria, urgency, frequency, hematuria and flank pain.  Musculoskeletal: Positive for back pain and joint pain. Negative for myalgias and neck pain.       Hx scoliosis and surgery of the lower back to partially correct it. Medial right knee pain  Skin: Negative for rash.     Chronic pruritus.  Has multiple lesions of the back including irritated seborrheic keratoses, atrophic plaques, multiple angiomas. Sebaceous cyst of the dorsal aspect of the left forearm. Mild paronychia of the fourth finger of the right hand medially.  Neurological: Positive for dizziness. Negative for tremors, seizures, weakness and headaches.       History of neuropathy with some numbness in the feet.  Endo/Heme/Allergies: Negative for polydipsia. Does not bruise/bleed easily.       Anemia resolved in May 2016  Psychiatric/Behavioral: Negative for suicidal ideas and hallucinations. The patient is not nervous/anxious.     Past Medical History  Diagnosis Date  . Atrial fibrillation ([place])   . Neuropathy (HCC)     PERIPHERAL  . Carotid artery occlusion     LEFT  . Scoliosis   . Hoarseness   . Headache(784.0)   . Dizziness   . Abdominal bloating   . Bruises easily   . Pruritus   . Hypercholesterolemia   . Fall at home Sept 2013, Dec. 2013  Jun 08, 2012  . Varicose veins     Patient Active Problem List   Diagnosis Date Noted  . GERD (gastroesophageal reflux disease) 02/11/2015  . Constipation 02/11/2015  . Cervicalgia 11/05/2014  . Paronychia 11/05/2014  . Aspiration into respiratory tract 08/27/2014  . Disease of pharynx 08/27/2014  . Dysphonia 08/27/2014  . Difficulty speaking 08/27/2014  . Can't get food down 08/27/2014  . 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 Low back pain 08/13/2014  . Carotid stenosis 08/13/2014  .  Hoarse 08/13/2014  . Xerostomia 06/18/2014  . Prickling sensation-Left Leg 06/05/2014  . Tingling sensation-Left Leg 06/05/2014  . Swelling of ankle 06/05/2014  . Hyperglycemia 04/02/2014  . Loss of weight 04/02/2014  . DOE (dyspnea on exertion) 04/02/2014  . Idiopathic scoliosis 04/02/2014  . Hearing loss 04/02/2014  . Dysphagia, pharyngoesophageal phase 04/02/2014  . Seborrheic keratoses, inflamed 04/02/2014  . Palliative care encounter  09/12/2013  . Weakness generalized 09/12/2013  . Anxiety state 09/12/2013  . GI bleed 09/10/2013  . Other and unspecified ovarian cyst 09/10/2013  . Fall 09/03/2013  . PVD (peripheral vascular disease) ([place]) 02/21/2013  . Occlusion and stenosis of carotid artery without mention of cerebral infarction 08/16/2012  . Varicose veins 12/22/2011  . Malaise and fatigue 10/06/2011  . Chest pain 04/02/2011  . Raynaud phenomenon 06/26/2010  . Osteoarthritis 06/26/2010  . Atrial fibrillation ([place])   . Neuropathy ([place])   . Hypercholesterolemia   . Headache(784.0)   . Dizziness   . Pruritus     Allergies  Allergen Reactions  . Tape Other (See Comments)    Tears skin. Band-Aids    Medications: Patient's Medications  New Prescriptions   No medications on file  Previous Medications   ALPRAZOLAM (XANAX) 0.25 MG TABLET    at bedtime as needed for anxiety (Take 1/2 tablet by mouth at bedtime as needed for anxiety). Reported on 02/11/2015   ASPIRIN 81 MG TABLET    Take 81 mg by mouth daily.   BIOTIN PO    Take by mouth daily.   CALCIUM-VITAMIN D-VITAMIN K 500-500-40 MG-UNT-MCG CHEW    Chew 1 tablet by mouth at bedtime. Chew one chew  at bedtime for calcium   DOCUSATE SODIUM (COLACE) 100 MG CAPSULE    Take 100 mg by mouth daily. Reported on 02/11/2015   GABAPENTIN (NEURONTIN) 100 MG CAPSULE    Take 100 mg by mouth at bedtime. Reported on 02/11/2015   MULTIPLE VITAMINS-MINERALS (CERTAVITE SENIOR/ANTIOXIDANT PO)    Take by mouth. Take one tablet by mouth daily   OMEPRAZOLE (PRILOSEC) 20 MG CAPSULE    Take 1 capsule (20 mg total) by mouth 2 (two) times daily before a meal.   PSYLLIUM (METAMUCIL) 58.6 % PACKET    Take 1 packet by mouth daily. Reported on 02/11/2015   SIMVASTATIN (ZOCOR) 40 MG TABLET    Take 20 mg by mouth daily. Take 1/2 tablet to equal 20  Mg.  Modified Medications   Modified Medication Previous Medication   ATENOLOL (TENORMIN) 25 MG TABLET atenolol (TENORMIN) 25 MG tablet       Take 1 tablet (25 mg total) by mouth 2 (two) times daily.    Take 25 mg by mouth 2 (two) times daily.  Discontinued Medications   BIOTIN (BIOTIN 5000) 5 MG CAPS    Take 5,000 mg by mouth daily.   MULTIPLE VITAMIN (MULTIVITAMIN) TABLET    Take 1 tablet by mouth daily.    Physical Exam: Filed Vitals:   02/07/15 1211  BP: 115/79  Pulse: 70  Temp: 96.3 F (35.7 C)  TempSrc: Oral  Resp: 18  Height: 5\' 2"  (1.575 m)  Weight: 101 lb 9.6 oz (46.085 kg)  SpO2: 87%   Body mass index is 18.58 kg/(m^2).  Physical Exam  Constitutional: She is oriented to person, place, and time. She appears well-developed and well-nourished. No distress.  Thin, frail.  HENT:  Right Ear: External ear normal.  Left Ear: External ear normal.  Nose: Nose normal.  Mouth/Throat:  Oropharynx is clear and moist. No oropharyngeal exudate.  Eyes: Conjunctivae and EOM are normal. Pupils are equal, round, and reactive to light. No scleral icterus.  Neck: No JVD present. No tracheal deviation present. No thyromegaly present.  Cardiovascular: Normal rate, normal heart sounds and intact distal pulses.  Exam reveals no gallop and no friction rub.   No murmur heard. Atrial fibrillation. Controlled rate.  Pulmonary/Chest: Effort normal. No respiratory distress. She has no wheezes. She has no rales. She exhibits no tenderness.  Hoarse   Abdominal: She exhibits no distension and no mass. There is no tenderness.  Musculoskeletal: Normal range of motion. She exhibits no edema or tenderness.  Scar in lumbar area secondary prior surgery. Some difficulty lying down and getting up from the exam table.  Lymphadenopathy:    She has no cervical adenopathy.  Neurological: She is alert and oriented to person, place, and time. No cranial nerve deficit. Coordination normal.  Skin: No rash noted. She is not diaphoretic. No erythema. No pallor.  Multiple seborrheic keratoses the temple and on the back.  Psychiatric: She has a normal mood  and affect. Her behavior is normal. Judgment and thought content normal.    Labs reviewed: Basic Metabolic Panel:  Recent Labs  04/05/14 07/16/14  NA 138 138  K 4.2 4.6  BUN 16 15  CREATININE 0.8 0.5    Liver Function Tests:  Recent Labs  04/05/14 07/16/14  AST 18 20  ALT 9 10  ALKPHOS 102 99    CBC:  Recent Labs  04/29/14 05/27/14 07/16/14  WBC 7.9 6.9 7.0  HGB 11.6* 12.2 11.4*  HCT 36 38 35*  PLT 306 288 265    Lab Results  Component Value Date   TSH 3.38 07/16/2014   Lab Results  Component Value Date   HGBA1C 6.6* 04/05/2014   Lab Results  Component Value Date   CHOL 148 04/05/2014   HDL 76* 04/05/2014   LDLCALC 53 04/05/2014   TRIG 94 04/05/2014   CHOLHDL 3 07/09/2013    Significant Diagnostic Results since last visit: none  Patient Care Team: Estill Dooms, MD as PCP - General (Internal Medicine) Angelia Mould, MD as Attending Physician (Vascular Surgery) Ronald Lobo, MD (Gastroenterology) Harle Battiest, MD as Attending Physician (Obstetrics and Gynecology) Amy Martinique, MD as Consulting Physician (Dermatology) Thornell Sartorius, MD (Otolaryngology) [name], MD (Orthopedic Surgery) [name], MD (Ophthalmology) [name], MD as Consulting Physician (Urology) [name], MD as Consulting Physician (Cardiology) [name], PA-C as Physician Assistant (Otolaryngology) [name], MD as Consulting Physician (Otolaryngology) [name], MD as Consulting Physician (Dermatology) [name], MD as Consulting Physician (Otolaryngology) [name], MD as Consulting Physician (Physical Medicine and Rehabilitation)  Assessment/Plan Problem List Items Addressed This Visit    Atrial fibrillation ([place]) (Chronic)    Heart rate is in control, continue Atenolol 25mg  bid.      Neuropathy (HCC) - Primary (Chronic)    Chronic, continue Gabapentin 100mg  nightly.       Osteoarthritis    Medial right knee pain, f/u ortho, warm  compress for now.       Anxiety state    Daily prn Xanax 0.25mg  is adequate for her to rest at night.       GERD (gastroesophageal reflux disease)    Stable presently, continue Omeprazole 20mg  daily.       Relevant Medications   psyllium (METAMUCIL) 58.6 % packet   docusate sodium (COLACE) 100 MG capsule  Constipation    Stable, continue Colace, Metamucil. Observe.           Family/ staff Communication: continue AL for care needs.   Labs/tests ordered: none  ManXie Shann Merrick NP [facility] [address], [place] [address] On Call:  [phone] & follow prompts after 5pm & weekends Office Phone:  [phone] Office Fax:  [phone]

## 2015-02-11 NOTE — Assessment & Plan Note (Signed)
Daily prn Xanax 0.25mg is adequate for her to rest at night.   

## 2015-02-11 NOTE — Assessment & Plan Note (Signed)
Stable presently, continue Omeprazole 20mg  daily.

## 2015-02-11 NOTE — Assessment & Plan Note (Signed)
Chronic, continue Gabapentin 100mg  nightly.

## 2015-02-11 NOTE — Assessment & Plan Note (Signed)
Stable, continue Colace, Metamucil. Observe.  

## 2015-02-11 NOTE — Assessment & Plan Note (Signed)
Heart rate is in control, continue Atenolol 25mg bid. 

## 2015-02-11 NOTE — Progress Notes (Signed)
Patient ID: Vanessa [name], female   DOB: Oct 21, 1920, 82 y.o.   MRN: [id number]    [place] Room Number: AL1  Place of Service: [facility] (31)     Allergies  Allergen Reactions  . Tape Other (See Comments)    Tears skin. Band-Aids    Chief Complaint  Patient presents with  . Medical Management of Chronic Issues    HPI:  Seen by Dr. Mare Ferrari 02/07/2015. Chronic atrial flutter/fibrillation. Previously on warfarin but because of falls this was discontinued. Remains on baby aspirin.  History of atypical chest pain. Nuclear stress study March 2013 showed no ischemia.  Known left carotid artery chronic occlusion followed by Dr. Gae Gallop.   Dr. Mare Ferrari did not recommend any changes.  Saw Dr. Nelva Bush 02/05/15 for back pains. Dr. Gladstone Lighter had given he injections x 2 in the right SI joint area.  Continues with pharyngitis. Dr. Cameron Proud thinks due to open mouth breathing at night and drying of the throat. Recommended a visit in [place], but she has not felt like going.  Got dizzy at Dr. Sherryl Barters office when she laid back on the exam table. Seems to have a positional vertigo. Sleeps on her side.   Medications: Patient's Medications  New Prescriptions   No medications on file  Previous Medications   ALPRAZOLAM (XANAX) 0.25 MG TABLET    at bedtime as needed for anxiety (Take 1/2 tablet by mouth at bedtime as needed for anxiety). Reported on 02/11/2015   ASPIRIN 81 MG TABLET    Take 81 mg by mouth daily.   ATENOLOL (TENORMIN) 25 MG TABLET    Take 1 tablet (25 mg total) by mouth 2 (two) times daily.   BIOTIN PO    Take by mouth daily.   CALCIUM-VITAMIN D-VITAMIN K 500-500-40 MG-UNT-MCG CHEW    Chew 1 tablet by mouth at bedtime. Chew one chew  at bedtime for calcium   DOCUSATE SODIUM (COLACE) 100 MG CAPSULE    Take 100 mg by mouth daily. Reported on 02/11/2015   GABAPENTIN (NEURONTIN) 100 MG CAPSULE    Take 100 mg by mouth at bedtime. Reported on  02/11/2015   MULTIPLE VITAMINS-MINERALS (CERTAVITE SENIOR/ANTIOXIDANT PO)    Take by mouth. Take one tablet by mouth daily   OMEPRAZOLE (PRILOSEC) 20 MG CAPSULE    Take 1 capsule (20 mg total) by mouth 2 (two) times daily before a meal.   PSYLLIUM (METAMUCIL) 58.6 % PACKET    Take 1 packet by mouth daily. Reported on 02/11/2015   SIMVASTATIN (ZOCOR) 40 MG TABLET    Take 20 mg by mouth daily. Take 1/2 tablet to equal 20  Mg.  Modified Medications   No medications on file  Discontinued Medications   No medications on file     Review of Systems  Constitutional: Positive for appetite change, fatigue and unexpected weight change. Negative for fever, chills, diaphoresis and activity change.  HENT: Positive for hearing loss and trouble swallowing. Negative for congestion, ear discharge, ear pain, postnasal drip, rhinorrhea, sore throat, tinnitus and voice change.        Chronic sinus congestion. Dentures. Xerostomia.  Eyes: Negative for pain, redness, itching and visual disturbance (Corrective lenses).  Respiratory: Positive for shortness of breath (On exertion). Negative for cough, choking and wheezing.   Cardiovascular: Positive for palpitations. Negative for chest pain and leg swelling.       Hx AF  Gastrointestinal: Negative for nausea, abdominal pain, diarrhea, constipation and abdominal distention.  HX GI bleed In Sept 2015. Difficulty swallowing solids. Avoids beef. Recent barium swallow 08/09/14 showed tight cricopharyngeus muscle, GERD, and mild esophageal dysmotility.  Endocrine: Negative for cold intolerance, heat intolerance, polydipsia, polyphagia and polyuria.       Elevated glucose 178 in Jan 2017.  Genitourinary: Negative for dysuria, urgency, frequency, hematuria, flank pain, vaginal discharge, difficulty urinating and pelvic pain.  Musculoskeletal: Positive for back pain and gait problem (Using cane). Negative for myalgias, arthralgias, neck pain and neck stiffness.       Hx  scoliosis and surgery of the lower back to partially correct it.  Skin: Negative for color change, pallor and rash.       Chronic pruritus.  Has multiple lesions of the back including irritated seborrheic keratoses, atrophic plaques, multiple angiomas. Sebaceous cyst of the dorsal aspect of the left forearm. Mild paronychia of the fourth finger of the right hand medially.  Allergic/Immunologic: Negative.   Neurological: Positive for dizziness. Negative for tremors, seizures, syncope, weakness, numbness and headaches.       History of neuropathy with some numbness in the feet.  Hematological: Negative for adenopathy. Does not bruise/bleed easily.       Anemia resolved in May 2016  Psychiatric/Behavioral: Negative for suicidal ideas, hallucinations, behavioral problems, confusion, sleep disturbance, dysphoric mood and agitation. The patient is not nervous/anxious and is not hyperactive.     Filed Vitals:   02/11/15 0929  BP: 120/82  Pulse: 89  Temp: 98 F (36.7 C)  TempSrc: Oral  Resp: 20  Height: '5\' 1"'  (1.549 m)  Weight: 104 lb 6.4 oz (47.356 kg)  SpO2: 91%   Wt Readings from Last 3 Encounters:  02/11/15 104 lb 6.4 oz (47.356 kg)  02/07/15 106 lb (48.081 kg)  02/07/15 101 lb 9.6 oz (46.085 kg)    Body mass index is 19.74 kg/(m^2).  Physical Exam  Constitutional: She is oriented to person, place, and time. She appears well-developed and well-nourished. No distress.  Thin, frail.  HENT:  Right Ear: External ear normal.  Left Ear: External ear normal.  Nose: Nose normal.  Mouth/Throat: Oropharynx is clear and moist. No oropharyngeal exudate.  Eyes: Conjunctivae and EOM are normal. Pupils are equal, round, and reactive to light. No scleral icterus.  Neck: No JVD present. No tracheal deviation present. No thyromegaly present.  Cardiovascular: Normal rate, normal heart sounds and intact distal pulses.  Exam reveals no gallop and no friction rub.   No murmur heard. Atrial  fibrillation. Controlled rate.  Pulmonary/Chest: Effort normal. No respiratory distress. She has no wheezes. She has no rales. She exhibits no tenderness.  Hoarse  Abdominal: She exhibits no distension and no mass. There is no tenderness.  Musculoskeletal: Normal range of motion. She exhibits no edema or tenderness.  Scar in lumbar area secondary prior surgery. Some difficulty lying down and getting up from the exam table. Unstable gait. Uses cane to improve balance.  Lymphadenopathy:    She has no cervical adenopathy.  Neurological: She is alert and oriented to person, place, and time. No cranial nerve deficit. Coordination normal.  Skin: No rash noted. She is not diaphoretic. No erythema. No pallor.  Multiple seborrheic keratoses the temple and on the back. Tender and irritated right 4th finger at nail.  Psychiatric: She has a normal mood and affect. Her behavior is normal. Judgment and thought content normal.     Labs reviewed: Lab Summary Latest Ref Rng 07/16/2014  Hemoglobin 12.0 - 16.0 g/dL 11.4(A)  Hematocrit  36 - 46 % 35(A)  White count - 7.0  Platelet count 150 - 399 K/L 265  Sodium 137 - 147 mmol/L 138  Potassium 3.4 - 5.3 mmol/L 4.6  Calcium - (None)  Phosphorus - (None)  Creatinine 0.5 - 1.1 mg/dL 0.5  AST 13 - 35 U/L 20  Alk Phos 25 - 125 U/L 99  Bilirubin - (None)  Glucose - 83  Cholesterol - (None)  HDL cholesterol - (None)  Triglycerides - (None)  LDL Direct - (None)  LDL Calc - (None)  Total protein - (None)  Albumin - (None)   Lab Results  Component Value Date   TSH 3.38 07/16/2014   Lab Results  Component Value Date   BUN 15 07/16/2014   BUN 16 04/05/2014   BUN 15 01/22/2014   Lab Results  Component Value Date   CREATININE 0.5 07/16/2014   CREATININE 0.8 04/05/2014   CREATININE 0.76 01/22/2014   Lab Results  Component Value Date   HGBA1C 6.6* 04/05/2014       Assessment/Plan  1. Chronic atrial fibrillation (HCC) She is not  anticoagulated due to his fall  2. Hoarse I see nothing abnormal in the posterior pharynx. Recommended she continue follow-up with Dr. Mellissa Kohut.  3. Midline low back pain without sciatica Dr. Nelva Bush didn't have anything in particular to recommend she change. She is just continuing to live with" this problem.  4. Paronychia, right Low-grade irritation around the right fourth she pushes the cuticle back repeatedly. I recommended that she stop doing this because it's keeping an irritated.  5. Pruritus She asked about seeing a new dermatologist. I recommended Dr. Syble Creek.  She says she is getting some relief from the [facility] intensive care with a low. I will hold her that this is not a curable condition.  6. Raynaud phenomenon Chronic condition. Patient asked about this about every visit. 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 Dysphonia Warbling voice related atrophy of vocal cord.

## 2015-02-28 NOTE — Assessment & Plan Note (Signed)
Medial right knee pain, f/u ortho, warm compress for now.

## 2015-03-14 ENCOUNTER — Encounter: Payer: Self-pay | Admitting: Nurse Practitioner

## 2015-03-14 ENCOUNTER — Non-Acute Institutional Stay: Payer: Medicare Other | Admitting: Nurse Practitioner

## 2015-03-14 DIAGNOSIS — M150 Primary generalized (osteo)arthritis: Secondary | ICD-10-CM

## 2015-03-14 DIAGNOSIS — M8949 Other hypertrophic osteoarthropathy, multiple sites: Secondary | ICD-10-CM

## 2015-03-14 DIAGNOSIS — K5900 Constipation, unspecified: Secondary | ICD-10-CM | POA: Diagnosis not present

## 2015-03-14 DIAGNOSIS — F411 Generalized anxiety disorder: Secondary | ICD-10-CM

## 2015-03-14 DIAGNOSIS — G629 Polyneuropathy, unspecified: Secondary | ICD-10-CM

## 2015-03-14 DIAGNOSIS — I482 Chronic atrial fibrillation, unspecified: Secondary | ICD-10-CM

## 2015-03-14 DIAGNOSIS — K219 Gastro-esophageal reflux disease without esophagitis: Secondary | ICD-10-CM | POA: Diagnosis not present

## 2015-03-14 DIAGNOSIS — M159 Polyosteoarthritis, unspecified: Secondary | ICD-10-CM

## 2015-03-14 NOTE — Assessment & Plan Note (Signed)
Heart rate is in control, continue Atenolol 25mg bid. 

## 2015-03-14 NOTE — Progress Notes (Signed)
Patient ID: Vanessa [name], female   DOB: 1920/04/13, 82 y.o.   MRN: [id number]  Location:  [facility] Room Number: AL 1 Place of Service:  [facility] Provider:  Lennie Odor Alohilani Levenhagen NP  Estill Dooms, MD  Patient Care Team: Estill Dooms, MD as PCP - General (Internal Medicine) Angelia Mould, MD as Attending Physician (Vascular Surgery) Ronald Lobo, MD (Gastroenterology) Harle Battiest, MD as Attending Physician (Obstetrics and Gynecology) Amy Martinique, MD as Consulting Physician (Dermatology) Thornell Sartorius, MD (Otolaryngology) [name], MD (Orthopedic Surgery) [name], MD (Ophthalmology) [name], MD as Consulting Physician (Urology) [name], MD as Consulting Physician (Cardiology) [name], PA-C as Physician Assistant (Otolaryngology) [name], MD as Consulting Physician (Otolaryngology) [name], MD as Consulting Physician (Dermatology) [name], MD as Consulting Physician (Otolaryngology) [name], MD as Consulting Physician (Physical Medicine and Rehabilitation)  Extended Emergency Contact Information Primary Emergency Contact: Heiss,Russ Address: [address] [place] [address] Johnnette Litter of [place] Phone: [phone] Relation: Son Secondary Emergency Contact: Larina Bras [place] of [place] Phone: [phone] Mobile Phone: [phone] Relation: Son  Code Status:  DNR Goals of care: Advanced Directive information Advanced Directives 03/14/2015  Does patient have an advance directive? Yes  Type of Advance Directive [place]  Does patient want to make changes to advanced directive? No - Patient declined  Copy of advanced directive(s) in chart? Yes     Chief Complaint  Patient presents with  . Medical Management of Chronic Issues    Routine Visit    HPI:  Pt is a 82 y.o. female seen today for medical management of chronic diseases.   Resolved right knee pain, medial, no erythema, swelling, or injury.  Peripheral neuropathy, taking Gabapentin 100mg  nightly, GERD has been stable except occasional heartburn symptoms while on Omeprazole, not constipation while on Colace and Metamucil, sleeps and rest at night with help of Alprazolam. Hx of Afib, heart rate is in control, taking Atenolol 25mg  bid. Chronic lower back pain-f/u Ortho.     Past Medical History  Diagnosis Date  . Atrial fibrillation ([place])   . Neuropathy (HCC)     PERIPHERAL  . Carotid artery occlusion     LEFT  . Scoliosis   . Hoarseness   . Headache(784.0)   . Dizziness   . Abdominal bloating   . Bruises easily   . Pruritus   . Hypercholesterolemia   . Fall at home Sept 2013, Dec. 2013  Jun 08, 2012  . Varicose veins    Past Surgical History  Procedure Laterality Date  . Abdominal hysterectomy  1954  . Spine surgery  1997    correct scoliosis  . Cholecystectomy  1997    Allergies  Allergen Reactions  . Tape Other (See Comments)    Tears skin. Band-Aids      Medication List       This list is accurate as of: 03/14/15  5:29 PM.  Always use your most recent med list.               ALPRAZolam 0.25 MG tablet  Commonly known as:  XANAX  Take 0.25 mg by mouth daily as needed for anxiety. Reported on 02/11/2015     aspirin 81 MG tablet  Take 81 mg by mouth daily.     atenolol 25 MG tablet  Commonly known as:  TENORMIN  Take 1 tablet (  25 mg total) by mouth 2 (two) times daily.     Calcium-Vitamin D-Vitamin K [id number] MG-UNT-MCG Chew  Chew 1 tablet by mouth at bedtime. Chew one chew  at bedtime for calcium     CERTAVITE SENIOR/ANTIOXIDANT PO  Take by mouth. Take one tablet by mouth daily     COLACE 100 MG capsule  Generic drug:  docusate sodium  Take 100 mg by mouth daily. Reported on 02/11/2015     gabapentin 100 MG capsule  Commonly known as:  NEURONTIN  Take 100 mg by mouth at bedtime. Reported on 02/11/2015     omeprazole 20  MG capsule  Commonly known as:  PRILOSEC  Take 1 capsule (20 mg total) by mouth 2 (two) times daily before a meal.     psyllium 58.6 % packet  Commonly known as:  METAMUCIL  Take 1 packet by mouth daily. Reported on 02/11/2015     simvastatin 40 MG tablet  Commonly known as:  ZOCOR  Take 20 mg by mouth daily. Take 1/2 tablet to equal 20  Mg.        Review of Systems  Constitutional: Negative for fever, chills and diaphoresis.  HENT: Positive for hearing loss. Negative for congestion, ear discharge, ear pain, sore throat and tinnitus.        Chronic sinus congestion. Dentures. Xerostomia.  Eyes: Negative for pain and redness.  Respiratory: Positive for shortness of breath (On exertion). Negative for cough and wheezing.   Cardiovascular: Positive for palpitations. Negative for chest pain and leg swelling.       Hx AF  Gastrointestinal: Negative for nausea, abdominal pain, diarrhea and constipation.       HX GI bleed In Sept 2015. Difficulty swallowing solids. Avoids beef. Recent barium swallow 08/09/14 showed tight cricopharyngeus muscle, GERD, and mild esophageal dysmotility.  Endocrine: Negative for polydipsia.  Genitourinary: Negative for dysuria, urgency, frequency, hematuria and flank pain.  Musculoskeletal: Positive for back pain. Negative for myalgias and neck pain.       Hx scoliosis and surgery of the lower back to partially correct it. Medial right knee pain  Skin: Negative for rash.       Chronic pruritus.  Has multiple lesions of the back including irritated seborrheic keratoses, atrophic plaques, multiple angiomas. Sebaceous cyst of the dorsal aspect of the left forearm. BLE varicose veins  Neurological: Positive for dizziness. Negative for tremors, seizures, weakness and headaches.       History of neuropathy with some numbness in the feet.  Hematological: Does not bruise/bleed easily.       Anemia resolved in May 2016  Psychiatric/Behavioral: Negative for suicidal  ideas and hallucinations. The patient is not nervous/anxious.     Immunization History  Administered Date(s) Administered  . Influenza Split 10/20/2011, 11/03/2012  . Influenza,inj,Quad PF,36+ Mos 09/29/2012, 11/09/2013  . Influenza-Unspecified 10/17/2014  . PPD Test 08/21/2013, 03/04/2014  . Pneumococcal Conjugate-13 08/21/2013  . Tdap 05/20/2008   Pertinent  Health Maintenance Due  Topic Date Due  . DEXA SCAN  01/31/1987  . PNA vac Low Risk Adult (2 of 2 - PPSV23) 08/22/2014  . INFLUENZA VACCINE  08/12/2015   Fall Risk  02/11/2015 11/05/2014 10/08/2014 04/02/2014 08/21/2013  Falls in the past year? No No No Yes Yes  Number falls in past yr: - - - 1 1  Injury with Fall? - - - Yes Yes  Risk Factor Category  - - - - -  Risk for fall due to : - - - - -  Risk for fall due to (comments): - - - - -   Functional Status Survey:    Filed Vitals:   03/14/15 1014  BP: 99/78  Pulse: 72  Temp: 95.5 F (35.3 C)  TempSrc: Oral  Resp: 18  Height: 5\' 1"  (1.549 m)  Weight: 104 lb (47.174 kg)   Body mass index is 19.66 kg/(m^2). Physical Exam  Constitutional: She is oriented to person, place, and time. She appears well-developed and well-nourished. No distress.  Thin, frail.  HENT:  Right Ear: External ear normal.  Left Ear: External ear normal.  Nose: Nose normal.  Mouth/Throat: Oropharynx is clear and moist. No oropharyngeal exudate.  Eyes: Conjunctivae and EOM are normal. Pupils are equal, round, and reactive to light. No scleral icterus.  Neck: No JVD present. No tracheal deviation present. No thyromegaly present.  Cardiovascular: Normal rate, normal heart sounds and intact distal pulses.  Exam reveals no gallop and no friction rub.   No murmur heard. Atrial fibrillation. Controlled rate.  Pulmonary/Chest: Effort normal. No respiratory distress. She has no wheezes. She has no rales. She exhibits no tenderness.  Hoarse   Abdominal: She exhibits no distension and no mass. There  is no tenderness.  Musculoskeletal: Normal range of motion. She exhibits no edema or tenderness.  Scar in lumbar area secondary prior surgery. Some difficulty lying down and getting up from the exam table.  Lymphadenopathy:    She has no cervical adenopathy.  Neurological: She is alert and oriented to person, place, and time. No cranial nerve deficit. Coordination normal.  Skin: No rash noted. She is not diaphoretic. No erythema. No pallor.  Multiple seborrheic keratoses the temple and on the back. Varicose veins.   Psychiatric: She has a normal mood and affect. Her behavior is normal. Judgment and thought content normal.    Labs reviewed:  Recent Labs  04/05/14 07/16/14  NA 138 138  K 4.2 4.6  BUN 16 15  CREATININE 0.8 0.5    Recent Labs  04/05/14 07/16/14  AST 18 20  ALT 9 10  ALKPHOS 102 99    Recent Labs  04/29/14 05/27/14 07/16/14  WBC 7.9 6.9 7.0  HGB 11.6* 12.2 11.4*  HCT 36 38 35*  PLT 306 288 265   Lab Results  Component Value Date   TSH 3.38 07/16/2014   Lab Results  Component Value Date   HGBA1C 6.6* 04/05/2014   Lab Results  Component Value Date   CHOL 148 04/05/2014   HDL 76* 04/05/2014   LDLCALC 53 04/05/2014   TRIG 94 04/05/2014   CHOLHDL 3 07/09/2013    Significant Diagnostic Results in last 30 days:  No results found.  Assessment/Plan  Atrial fibrillation Heart rate is in control, continue Atenolol 25mg  bid.  Neuropathy Chronic, continue Gabapentin 100mg  nightly  Osteoarthritis Resolved medial right knee pain, f/u ortho  Anxiety state Daily prn Xanax 0.25mg  is adequate for her to rest at night.   Constipation Stable, continue Colace, Metamucil. Observe.   GERD (gastroesophageal reflux disease) Occasional heartburn symptoms, continue Omeprazole 20mg  daily.     Family/ staff Communication: continue AL for care needs  Labs/tests ordered:  none

## 2015-03-14 NOTE — Assessment & Plan Note (Signed)
Stable, continue Colace, Metamucil. Observe.  

## 2015-03-14 NOTE — Assessment & Plan Note (Signed)
Chronic, continue Gabapentin 100mg  nightly

## 2015-03-14 NOTE — Assessment & Plan Note (Signed)
Daily prn Xanax 0.25mg is adequate for her to rest at night.   

## 2015-03-14 NOTE — Assessment & Plan Note (Signed)
Resolved medial right knee pain, f/u ortho

## 2015-03-14 NOTE — Assessment & Plan Note (Signed)
Occasional heartburn symptoms, continue Omeprazole 20mg  daily.

## 2015-04-01 ENCOUNTER — Encounter: Payer: Self-pay | Admitting: Internal Medicine

## 2015-04-01 ENCOUNTER — Other Ambulatory Visit: Payer: Self-pay | Admitting: Cardiology

## 2015-04-01 ENCOUNTER — Non-Acute Institutional Stay: Payer: Medicare Other | Admitting: Internal Medicine

## 2015-04-01 VITALS — BP 110/80 | HR 56 | Temp 97.3°F | Resp 20 | Ht 61.0 in | Wt 104.6 lb

## 2015-04-01 DIAGNOSIS — R634 Abnormal weight loss: Secondary | ICD-10-CM | POA: Diagnosis not present

## 2015-04-01 DIAGNOSIS — R1030 Lower abdominal pain, unspecified: Secondary | ICD-10-CM

## 2015-04-01 DIAGNOSIS — M545 Low back pain, unspecified: Secondary | ICD-10-CM

## 2015-04-01 DIAGNOSIS — R1314 Dysphagia, pharyngoesophageal phase: Secondary | ICD-10-CM | POA: Diagnosis not present

## 2015-04-01 DIAGNOSIS — G629 Polyneuropathy, unspecified: Secondary | ICD-10-CM | POA: Diagnosis not present

## 2015-04-01 DIAGNOSIS — G8929 Other chronic pain: Secondary | ICD-10-CM | POA: Insufficient documentation

## 2015-04-01 DIAGNOSIS — R490 Dysphonia: Secondary | ICD-10-CM

## 2015-04-01 DIAGNOSIS — R1013 Epigastric pain: Secondary | ICD-10-CM

## 2015-04-01 DIAGNOSIS — R102 Pelvic and perineal pain: Secondary | ICD-10-CM | POA: Insufficient documentation

## 2015-04-01 MED ORDER — SUCRALFATE 1 GM/10ML PO SUSP: ORAL | Status: DC

## 2015-04-01 NOTE — Progress Notes (Signed)
Patient ID: Vanessa [name], female   DOB: August 09, 1920, 82 y.o.   MRN: [id number]   [facility]  Provider:  Jonetta Speak.  Code Status: full Goals of Care:  Advanced Directives 04/01/2015  Does patient have an advance directive? Yes  Type of Advance Directive [place]  Does patient want to make changes to advanced directive? No - Patient declined  Copy of advanced directive(s) in chart? Yes     Chief Complaint  Patient presents with  . Acute Visit    stomach issues    HPI: Patient is a 82 y.o. female seen today for an acute visit for eppigastric and suprapubic discomfort.  Abdominal pain, chronic, epigastric - epigastric discomfort has been present for several months. She has been seeing Dr. Cristina Gong and a PA at his office. She doesn't feel like anything is being done to give her adequate relief. Patient had upper GI bleed about 18 months ago and was hospitalized. She did not want EGD at that time. She has been taking omeprazole 20 mg daily since then.  Patient is now concerned that she might have cancer of the pancreas. She has however gained weight. She has gone up 5 pounds since June 2000. She has no history of previous EGD or colonoscopy. In 08/09/2014 esophageal x-rays showed a tight cricopharyngeal muscle.On 09/10/2013 CT of the abdomen showed a cyst in the right ovary, but was otherwise unremarkable.On 12/29/2011 she had a modified barium swallowing study during which a tablet lodged in the upper esophagus. Patient has seen Dr. Benjamine Mola and Dr. Ernesto Rutherford for complaints about swallowing.he was to be seen in [place] at one point, but has not followed through on that.  Patient denies change in bowel habits. No diarrhea or cold food particles in stool. No diarrhea.  Suprapubic pain, unspecified laterality - pain in the suprapubic area and some mild dysuria  Dysphagia, pharyngoesophageal phase - chronic difficulty with swallowing. Note the weight gainof 5 pounds  since June 2016.  Hoarse - chronic problem for which she has seen 2 ENT doctors  Loss of weight - resolved  Neuropathy (HCC) - prickling sensations resolved with a low-dose of gabapentin 100 mg nightly. She now thinks that some of her symptoms in regards to her abdomen got worse after starting this medication   and asks whether she should stop it. If she should stop it, she would like something to take its place since it did seem to help.  Midline low back pain without sciatica - Dr. Gladstone Lighter has been giving her injections for sacroiliitis. He has referred her to Dr. Nelva Bush to see if a better response could be obtained with him.    Past Medical History  Diagnosis Date  . Atrial fibrillation ([place])   . Neuropathy (HCC)     PERIPHERAL  . Carotid artery occlusion     LEFT  . Scoliosis   . Hoarseness   . Headache(784.0)   . Dizziness   . Abdominal bloating   . Bruises easily   . Pruritus   . Hypercholesterolemia   . Fall at home Sept 2013, Dec. 2013  Jun 08, 2012  . Varicose veins     Past Surgical History  Procedure Laterality Date  . Abdominal hysterectomy  1954  . Spine surgery  1997    correct scoliosis  . Cholecystectomy  1997    Allergies  Allergen Reactions  . Tape Other (See Comments)    Tears skin. Band-Aids      Medication  List       This list is accurate as of: 04/01/15 10:58 AM.  Always use your most recent med list.               ALPRAZolam 0.25 MG tablet  Commonly known as:  XANAX  Take 0.25 mg by mouth daily as needed for anxiety. Reported on 02/11/2015     aspirin 81 MG tablet  Take 81 mg by mouth daily.     atenolol 25 MG tablet  Commonly known as:  TENORMIN  Take 1 tablet (25 mg total) by mouth 2 (two) times daily.     Biotin 5000 MCG Subl  Place 1 tablet under the tongue daily.     Calcium-Vitamin D-Vitamin K [id number] MG-UNT-MCG Chew  Chew 1 tablet by mouth at bedtime. Chew one chew  at bedtime for calcium     CERTAVITE  SENIOR/ANTIOXIDANT PO  Take by mouth. Take one tablet by mouth daily     COLACE 100 MG capsule  Generic drug:  docusate sodium  Take 100 mg by mouth daily. Reported on 02/11/2015     gabapentin 100 MG capsule  Commonly known as:  NEURONTIN  Take 100 mg by mouth at bedtime. Reported on 02/11/2015     omeprazole 20 MG capsule  Commonly known as:  PRILOSEC  Take 1 capsule (20 mg total) by mouth 2 (two) times daily before a meal.     simvastatin 40 MG tablet  Commonly known as:  ZOCOR  Take 20 mg by mouth daily. Take 1/2 tablet to equal 20  Mg.        Review of Systems:  Review of Systems  Constitutional: Negative for fever, chills and diaphoresis.  HENT: Positive for hearing loss. Negative for congestion, ear discharge, ear pain, sore throat and tinnitus.        Chronic sinus congestion. Dentures. Xerostomia.  Eyes: Negative for pain and redness.  Respiratory: Positive for shortness of breath (On exertion). Negative for cough and wheezing.   Cardiovascular: Positive for palpitations. Negative for chest pain and leg swelling.       Hx AF  Gastrointestinal: Negative for nausea, abdominal pain, diarrhea and constipation.       HX GI bleed In Sept 2015. Difficulty swallowing solids. Avoids beef. Barium swallow 08/09/14 showed tight cricopharyngeus muscle, GERD, and mild esophageal dysmotility.  Endocrine: Negative for polydipsia.  Genitourinary: Negative for dysuria, urgency, frequency, hematuria and flank pain.  Musculoskeletal: Positive for back pain. Negative for myalgias and neck pain.       Hx scoliosis and surgery of the lower back to partially correct it. Medial right knee pain  Skin: Negative for rash.       Chronic pruritus.  Has multiple lesions of the back including irritated seborrheic keratoses, atrophic plaques, multiple angiomas. Sebaceous cyst of the dorsal aspect of the left forearm. BLE varicose veins  Neurological: Positive for dizziness. Negative for tremors,  seizures, weakness and headaches.       History of neuropathy with some numbness in the feet.  Hematological: Does not bruise/bleed easily.       Anemia resolved in May 2016  Psychiatric/Behavioral: Negative for suicidal ideas and hallucinations. The patient is not nervous/anxious.     Health Maintenance  Topic Date Due  . ZOSTAVAX  01/30/1982  . DEXA SCAN  01/31/1987  . PNA vac Low Risk Adult (2 of 2 - PPSV23) 08/22/2014  . INFLUENZA VACCINE  08/12/2015  . TETANUS/TDAP  05/21/2018  Physical Exam: Filed Vitals:   04/01/15 1045  BP: 110/80  Pulse: 56  Temp: 97.3 F (36.3 C)  TempSrc: Oral  Resp: 20  Height: 5\' 1"  (1.549 m)  Weight: 104 lb 9.6 oz (47.446 kg)  SpO2: 91%   Body mass index is 19.77 kg/(m^2). Physical Exam  Constitutional: She is oriented to person, place, and time. She appears well-developed and well-nourished. No distress.  Thin, frail.  HENT:  Right Ear: External ear normal.  Left Ear: External ear normal.  Nose: Nose normal.  Mouth/Throat: Oropharynx is clear and moist. No oropharyngeal exudate.  Eyes: Conjunctivae and EOM are normal. Pupils are equal, round, and reactive to light. No scleral icterus.  Neck: No JVD present. No tracheal deviation present. No thyromegaly present.  Cardiovascular: Normal rate, normal heart sounds and intact distal pulses.  Exam reveals no gallop and no friction rub.   No murmur heard. Atrial fibrillation. Controlled rate.  Pulmonary/Chest: Effort normal. No respiratory distress. She has no wheezes. She has no rales. She exhibits no tenderness.  Hoarse   Abdominal: She exhibits no distension and no mass. There is no tenderness.  Musculoskeletal: Normal range of motion. She exhibits no edema or tenderness.  Scar in lumbar area secondary prior surgery. Some difficulty lying down and getting up from the exam table.  Lymphadenopathy:    She has no cervical adenopathy.  Neurological: She is alert and oriented to person,  place, and time. No cranial nerve deficit. Coordination normal.  Skin: No rash noted. She is not diaphoretic. No erythema. No pallor.  Multiple seborrheic keratoses the temple and on the back. Varicose veins.   Psychiatric: She has a normal mood and affect. Her behavior is normal. Judgment and thought content normal.    Labs reviewed: Basic Metabolic Panel:  Recent Labs  04/05/14 07/16/14  NA 138 138  K 4.2 4.6  BUN 16 15  CREATININE 0.8 0.5  TSH 2.22 3.38   Liver Function Tests:  Recent Labs  04/05/14 07/16/14  AST 18 20  ALT 9 10  ALKPHOS 102 99   No results for input(s): LIPASE, AMYLASE in the last 8760 hours. No results for input(s): AMMONIA in the last 8760 hours. CBC:  Recent Labs  04/29/14 05/27/14 07/16/14  WBC 7.9 6.9 7.0  HGB 11.6* 12.2 11.4*  HCT 36 38 35*  PLT 306 288 265   Lipid Panel:  Recent Labs  04/05/14  CHOL 148  HDL 76*  LDLCALC 53  TRIG 94   Lab Results  Component Value Date   HGBA1C 6.6* 04/05/2014    Assessment/Plan  1. Abdominal pain, chronic, epigastric -discontinue omeprazole since it does not seem to have helped her symptoms - sucralfate (CARAFATE) 1 GM/10ML suspension; 10 ml between meals and at bedtime to help stomach.  Dispense: 300 mL; Refill: 0 -amylase, lipase, CMP, CBC  2. Suprapubic pain, unspecified laterality -cath urinalysis and culture  3. Dysphagia, pharyngoesophageal phase - sucralfate (CARAFATE) 1 GM/10ML suspension; 10 ml between meals and at bedtime to help stomach.  Dispense: 300 mL; Refill: 0  4. Hoarse See ENT as needed  5. Loss of weight May be resolved  6. Neuropathy (HCC) Improved on 100 mg gabapentin. Consider stopping this drug next visit  7. Midline low back pain without sciatica See Dr. Nelva Bush as planned

## 2015-04-02 ENCOUNTER — Other Ambulatory Visit: Payer: Self-pay | Admitting: *Deleted

## 2015-04-02 DIAGNOSIS — R1013 Epigastric pain: Principal | ICD-10-CM

## 2015-04-02 DIAGNOSIS — R1314 Dysphagia, pharyngoesophageal phase: Secondary | ICD-10-CM

## 2015-04-02 DIAGNOSIS — G8929 Other chronic pain: Secondary | ICD-10-CM

## 2015-04-02 MED ORDER — SUCRALFATE 1 GM/10ML PO SUSP: ORAL | Status: DC

## 2015-04-02 NOTE — Telephone Encounter (Signed)
Received fax from [facility] requesting Rx to be faxed to [facility]. Faxed.

## 2015-04-03 DIAGNOSIS — R748 Abnormal levels of other serum enzymes: Secondary | ICD-10-CM | POA: Diagnosis not present

## 2015-04-03 DIAGNOSIS — I10 Essential (primary) hypertension: Secondary | ICD-10-CM | POA: Diagnosis not present

## 2015-04-03 DIAGNOSIS — D649 Anemia, unspecified: Secondary | ICD-10-CM | POA: Diagnosis not present

## 2015-04-03 LAB — CBC AND DIFFERENTIAL
HCT: 36 % (ref 36–46)
Hemoglobin: 12 g/dL (ref 12.0–16.0)
Platelets: 269 10*3/uL (ref 150–399)
WBC: 7.1 10^3/mL

## 2015-04-03 LAB — BASIC METABOLIC PANEL
BUN: 14 mg/dL (ref 4–21)
Creatinine: 0.9 mg/dL (ref 0.5–1.1)
Glucose: 71 mg/dL
Potassium: 4.6 mmol/L (ref 3.4–5.3)
Sodium: 139 mmol/L (ref 137–147)

## 2015-04-03 LAB — HEPATIC FUNCTION PANEL
ALT: 9 U/L (ref 7–35)
AST: 18 U/L (ref 13–35)
Alkaline Phosphatase: 85 U/L (ref 25–125)
Bilirubin, Total: 0.6 mg/dL

## 2015-04-15 ENCOUNTER — Encounter: Payer: Self-pay | Admitting: Internal Medicine

## 2015-04-15 ENCOUNTER — Non-Acute Institutional Stay: Payer: Medicare Other | Admitting: Internal Medicine

## 2015-04-15 VITALS — BP 90/70 | HR 69 | Temp 97.1°F | Resp 20 | Ht 61.0 in | Wt 103.4 lb

## 2015-04-15 DIAGNOSIS — I482 Chronic atrial fibrillation, unspecified: Secondary | ICD-10-CM

## 2015-04-15 DIAGNOSIS — L299 Pruritus, unspecified: Secondary | ICD-10-CM | POA: Diagnosis not present

## 2015-04-15 DIAGNOSIS — I8390 Asymptomatic varicose veins of unspecified lower extremity: Secondary | ICD-10-CM

## 2015-04-15 DIAGNOSIS — G8929 Other chronic pain: Secondary | ICD-10-CM | POA: Diagnosis not present

## 2015-04-15 DIAGNOSIS — E039 Hypothyroidism, unspecified: Secondary | ICD-10-CM

## 2015-04-15 DIAGNOSIS — R1013 Epigastric pain: Secondary | ICD-10-CM | POA: Diagnosis not present

## 2015-04-15 DIAGNOSIS — R490 Dysphonia: Secondary | ICD-10-CM | POA: Diagnosis not present

## 2015-04-15 DIAGNOSIS — I868 Varicose veins of other specified sites: Secondary | ICD-10-CM | POA: Diagnosis not present

## 2015-04-15 DIAGNOSIS — IMO0001 Reserved for inherently not codable concepts without codable children: Secondary | ICD-10-CM

## 2015-04-15 DIAGNOSIS — R1314 Dysphagia, pharyngoesophageal phase: Secondary | ICD-10-CM | POA: Diagnosis not present

## 2015-04-15 DIAGNOSIS — R1310 Dysphagia, unspecified: Secondary | ICD-10-CM | POA: Diagnosis not present

## 2015-04-15 HISTORY — DX: Hypothyroidism, unspecified: E03.9

## 2015-04-15 NOTE — Progress Notes (Signed)
Patient ID: Vanessa [name], female   DOB: 08-27-1920, 82 y.o.   MRN: [id number]   Location:  [place] [facility]  Provider:  Jeanmarie Hubert, MD  Code Status: full Goals of Care:  Advanced Directives 04/15/2015  Does patient have an advance directive? Yes  Type of Advance Directive [place]  Does patient want to make changes to advanced directive? No - Patient declined  Copy of advanced directive(s) in chart? Yes     Chief Complaint  Patient presents with  . Medical Management of Chronic Issues    2-3 week f/u    HPI: Patient is a 82 y.o. female seen today for medical management of chronic diseases.    Last seen 04/01/15 for abd pain that is now better off the omeprazole and on the Carafate. Swallowing is doing a little better. She attributes this to chewing her food better.  Feels like there may be a bladder problem. Thinks she does not urinate a sufficient amount sometimes. She admits she does not drink enough fluids.  Has episodes of feeling a vibration in her body. Has chronic AF atrial flutter. She thinks it is related to circulation. The sensation makes her feel uneasy. Bothers her. Has an appt with NP at Dr. Nicole Cella office.      Past Medical History  Diagnosis Date  . Atrial fibrillation ([place])   . Neuropathy (HCC)     PERIPHERAL  . Carotid artery occlusion     LEFT  . Scoliosis   . Hoarseness   . Headache(784.0)   . Dizziness   . Abdominal bloating   . Bruises easily   . Pruritus   . Hypercholesterolemia   . Fall at home Sept 2013, Dec. 2013  Jun 08, 2012  . Varicose veins     Past Surgical History  Procedure Laterality Date  . Abdominal hysterectomy  1954  . Spine surgery  1997    correct scoliosis  . Cholecystectomy  1997    Allergies  Allergen Reactions  . Tape Other (See Comments)    Tears skin. Band-Aids      Medication List       This list is accurate as of: 04/15/15 11:06 AM.  Always use your most recent med list.               ALPRAZolam 0.25 MG tablet  Commonly known as:  XANAX  Take 0.25 mg by mouth daily as needed for anxiety. Reported on 02/11/2015     aspirin 81 MG tablet  Take 81 mg by mouth daily.     atenolol 25 MG tablet  Commonly known as:  TENORMIN  Take 1 tablet (25 mg total) by mouth 2 (two) times daily.     Biotin 5000 MCG Subl  Place 1 tablet under the tongue daily.     Calcium-Vitamin D-Vitamin K [id number] MG-UNT-MCG Chew  Chew 1 tablet by mouth at bedtime. Chew one chew  at bedtime for calcium     CERTAVITE SENIOR/ANTIOXIDANT PO  Take by mouth. Take one tablet by mouth daily     COLACE 100 MG capsule  Generic drug:  docusate sodium  Take 100 mg by mouth daily. Reported on 02/11/2015     gabapentin 100 MG capsule  Commonly known as:  NEURONTIN  Take 100 mg by mouth at bedtime. Reported on 02/11/2015     simvastatin 40 MG tablet  Commonly known as:  ZOCOR  Take 20 mg by mouth daily. Take 1/2 tablet to  equal 20  Mg.     sucralfate 1 GM/10ML suspension  Commonly known as:  CARAFATE  10 ml between meals and at bedtime to help stomach.        Review of Systems:  Review of Systems  Constitutional: Negative for fever, chills and diaphoresis.  HENT: Positive for hearing loss. Negative for congestion, ear discharge, ear pain, sore throat and tinnitus.        Chronic sinus congestion. Dentures. Xerostomia.  Eyes: Negative for pain and redness.  Respiratory: Positive for shortness of breath (On exertion). Negative for cough and wheezing.   Cardiovascular: Positive for palpitations. Negative for chest pain and leg swelling.       Hx AF  Gastrointestinal: Negative for nausea, abdominal pain, diarrhea and constipation.       HX GI bleed In Sept 2015. Difficulty swallowing solids. Avoids beef. Barium swallow 08/09/14 showed tight cricopharyngeus muscle, GERD, and mild esophageal dysmotility.  Endocrine: Negative for polydipsia.  Genitourinary: Negative for dysuria, urgency,  frequency, hematuria and flank pain.  Musculoskeletal: Positive for back pain. Negative for myalgias and neck pain.       Hx scoliosis and surgery of the lower back to partially correct it. Medial right knee pain  Skin: Negative for rash.       Chronic pruritus.  Has multiple lesions of the back including irritated seborrheic keratoses, atrophic plaques, multiple angiomas. Sebaceous cyst of the dorsal aspect of the left forearm. BLE varicose veins  Neurological: Positive for dizziness. Negative for tremors, seizures, weakness and headaches.       History of neuropathy with some numbness in the feet.  Hematological: Does not bruise/bleed easily.       Anemia resolved in May 2016  Psychiatric/Behavioral: Negative for suicidal ideas and hallucinations. The patient is not nervous/anxious.     Health Maintenance  Topic Date Due  . ZOSTAVAX  01/30/1982  . DEXA SCAN  01/31/1987  . PNA vac Low Risk Adult (2 of 2 - PPSV23) 08/22/2014  . INFLUENZA VACCINE  08/12/2015  . TETANUS/TDAP  05/21/2018    Physical Exam: Filed Vitals:   04/15/15 1053  BP: 90/70  Pulse: 69  Temp: 97.1 F (36.2 C)  TempSrc: Oral  Resp: 20  Height: 5\' 1"  (1.549 m)  Weight: 103 lb 6.4 oz (46.902 kg)  SpO2: 91%   Body mass index is 19.55 kg/(m^2). Physical Exam  Constitutional: She is oriented to person, place, and time. She appears well-developed and well-nourished. No distress.  Thin, frail.  HENT:  Right Ear: External ear normal.  Left Ear: External ear normal.  Nose: Nose normal.  Mouth/Throat: Oropharynx is clear and moist. No oropharyngeal exudate.  Eyes: Conjunctivae and EOM are normal. Pupils are equal, round, and reactive to light. No scleral icterus.  Neck: No JVD present. No tracheal deviation present. No thyromegaly present.  Cardiovascular: Normal rate, normal heart sounds and intact distal pulses.  Exam reveals no gallop and no friction rub.   No murmur heard. Atrial fibrillation. Controlled  rate. Varicose veins in both legs.  Pulmonary/Chest: Effort normal. No respiratory distress. She has no wheezes. She has no rales. She exhibits no tenderness.  Hoarse   Abdominal: She exhibits no distension and no mass. There is no tenderness.  Musculoskeletal: Normal range of motion. She exhibits no edema or tenderness.  Scar in lumbar area secondary prior surgery. Some difficulty lying down and getting up from the exam table.  Lymphadenopathy:    She has no cervical adenopathy.  Neurological: She is alert and oriented to person, place, and time. No cranial nerve deficit. Coordination normal.  Skin: No rash noted. She is not diaphoretic. No erythema. No pallor.  Multiple seborrheic keratoses the temple and on the back. Varicose veins.   Psychiatric: She has a normal mood and affect. Her behavior is normal. Judgment and thought content normal.    Labs reviewed: Basic Metabolic Panel:  Recent Labs  07/16/14  NA 138  K 4.6  BUN 15  CREATININE 0.5  TSH 3.38   Liver Function Tests:  Recent Labs  07/16/14  AST 20  ALT 10  ALKPHOS 99   No results for input(s): LIPASE, AMYLASE in the last 8760 hours. No results for input(s): AMMONIA in the last 8760 hours. CBC:  Recent Labs  04/29/14 05/27/14 07/16/14  WBC 7.9 6.9 7.0  HGB 11.6* 12.2 11.4*  HCT 36 38 35*  PLT 306 288 265   Lipid Panel: No results for input(s): CHOL, HDL, LDLCALC, TRIG, CHOLHDL, LDLDIRECT in the last 8760 hours. Lab Results  Component Value Date   HGBA1C 6.6* 04/05/2014     Assessment/Plan 1. Can't get food down improved  2. Dysphonia stable  3. Chronic atrial fibrillation (HCC) His may explain her symptoms are moving regularly. I discussed adding diltiazemto her atenolol. Blood pressures are running low, so she was warned that it couldcause dizziness. She does not want to start another medication at this time.  4. Dysphagia, pharyngoesophageal phase Improved if she chews her food well  5.  Abdominal pain, chronic, epigastric Improved on Carafate  6. Pruritus improved  7. Hypothyroidism, unspecified hypothyroidism type ompensated    Next appt:  06/10/2015

## 2015-04-22 ENCOUNTER — Encounter: Payer: Self-pay | Admitting: Internal Medicine

## 2015-04-29 ENCOUNTER — Non-Acute Institutional Stay: Payer: Medicare Other | Admitting: Internal Medicine

## 2015-04-29 ENCOUNTER — Encounter: Payer: Self-pay | Admitting: Internal Medicine

## 2015-04-29 VITALS — BP 124/64 | HR 59 | Temp 97.4°F | Ht 61.0 in | Wt 102.0 lb

## 2015-04-29 DIAGNOSIS — R490 Dysphonia: Secondary | ICD-10-CM | POA: Diagnosis not present

## 2015-04-29 DIAGNOSIS — R1314 Dysphagia, pharyngoesophageal phase: Secondary | ICD-10-CM

## 2015-04-29 DIAGNOSIS — R634 Abnormal weight loss: Secondary | ICD-10-CM | POA: Diagnosis not present

## 2015-04-29 NOTE — Progress Notes (Signed)
Patient ID: Vanessa [name], female   DOB: [birth year], 82 y.o.   MRN: [id number]    [place] Room Number: AL 01  Place of Service: Clinic (12)     Allergies  Allergen Reactions  . Tape Other (See Comments)    Tears skin. Band-Aids    Chief Complaint  Patient presents with  . Abdominal Pain    finished second bottle of Carafate, pain comes and goes, wants to know what to do next.     HPI:  Patient was last seen 04/15/2015. 2 weeks ago she said she was doing better on Carafate and that the abd swelling was doing a little bit better. She now says that she is not satisfied with the partial resolution of her symptoms. She continues to feel abdominal discomfort and swelling. Has improved, but still there off and on.   Her adopted son has just been told he has intra-abdominal cancer. She has no further details, except that he is going to have surgery and probable chemotherapy and/or radiation therapy. This is creating increased anxiety she denies depression. She previously took alprazolam 0.25 mg as needed, but hasn't used it in the year. She is requesting a refill on this medication  Medications: Patient's Medications  New Prescriptions   No medications on file  Previous Medications   ALPRAZOLAM (XANAX) 0.25 MG TABLET    Take 0.25 mg by mouth daily as needed for anxiety. Reported on 02/11/2015   ASPIRIN 81 MG TABLET    Take 81 mg by mouth daily.   ATENOLOL (TENORMIN) 25 MG TABLET    Take 1 tablet (25 mg total) by mouth 2 (two) times daily.   BIOTIN 5000 MCG SUBL    Place 1 tablet under the tongue daily.   CALCIUM-VITAMIN D-VITAMIN K 500-500-40 MG-UNT-MCG CHEW    Chew 1 tablet by mouth at bedtime. Chew one chew  at bedtime for calcium   GABAPENTIN (NEURONTIN) 100 MG CAPSULE    Take 100 mg by mouth at bedtime. Reported on 02/11/2015   MULTIPLE VITAMINS-MINERALS (CERTAVITE SENIOR/ANTIOXIDANT PO)    Take by mouth. Take one tablet by mouth daily   PSYLLIUM  (METAMUCIL PO)    Take by mouth. Once daily mix in 8 oz of liquid   SIMVASTATIN (ZOCOR) 40 MG TABLET    Take 20 mg by mouth daily. Take 1/2 tablet to equal 20  Mg.   SUCRALFATE (CARAFATE) 1 GM/10ML SUSPENSION    10 ml between meals and at bedtime to help stomach.  Modified Medications   No medications on file  Discontinued Medications   DOCUSATE SODIUM (COLACE) 100 MG CAPSULE    Take 100 mg by mouth daily. Reported on 02/11/2015     Review of Systems  Constitutional: Negative for fever, chills and diaphoresis.  HENT: Positive for hearing loss. Negative for congestion, ear discharge, ear pain, sore throat and tinnitus.        Chronic sinus congestion. Dentures. Xerostomia.  Eyes: Negative for pain and redness.  Respiratory: Positive for shortness of breath (On exertion). Negative for cough and wheezing.   Cardiovascular: Positive for palpitations. Negative for chest pain and leg swelling.       Hx AF  Gastrointestinal: Negative for nausea, abdominal pain, diarrhea and constipation.       HX GI bleed In Sept 2015. Difficulty swallowing solids. Avoids beef. Barium swallow 08/09/14 showed tight cricopharyngeus muscle, GERD, and mild esophageal dysmotility.  Endocrine: Negative for polydipsia.  Genitourinary: Negative  for dysuria, urgency, frequency, hematuria and flank pain.  Musculoskeletal: Positive for back pain. Negative for myalgias and neck pain.       Hx scoliosis and surgery of the lower back to partially correct it. Medial right knee pain  Skin: Negative for rash.       Chronic pruritus.  Has multiple lesions of the back including irritated seborrheic keratoses, atrophic plaques, multiple angiomas. Sebaceous cyst of the dorsal aspect of the left forearm. BLE varicose veins  Neurological: Positive for dizziness. Negative for tremors, seizures, weakness and headaches.       History of neuropathy with some numbness in the feet.  Hematological: Does not bruise/bleed easily.        Anemia resolved in May 2016  Psychiatric/Behavioral: Negative for suicidal ideas and hallucinations. The patient is nervous/anxious.     Filed Vitals:   04/29/15 1156  BP: 124/64  Pulse: 59  Temp: 97.4 F (36.3 C)  TempSrc: Oral  Height: _0  (1.549 m)  Weight: 102 lb (46.267 kg)  SpO2: 91%   Wt Readings from Last 3 Encounters:  04/29/15 102 lb (46.267 kg)  04/15/15 103 lb 6.4 oz (46.902 kg)  04/01/15 104 lb 9.6 oz (47.446 kg)    Body mass index is 19.28 kg/(m^2).  Physical Exam  Constitutional: She is oriented to person, place, and time. She appears well-developed and well-nourished. No distress.  Thin, frail.  HENT:  Right Ear: External ear normal.  Left Ear: External ear normal.  Nose: Nose normal.  Mouth/Throat: Oropharynx is clear and moist. No oropharyngeal exudate.  Eyes: Conjunctivae and EOM are normal. Pupils are equal, round, and reactive to light. No scleral icterus.  Neck: No JVD present. No tracheal deviation present. No thyromegaly present.  Cardiovascular: Normal rate, normal heart sounds and intact distal pulses.  Exam reveals no gallop and no friction rub.   No murmur heard. Atrial fibrillation. Controlled rate. Varicose veins in both legs.  Pulmonary/Chest: Effort normal. No respiratory distress. She has no wheezes. She has no rales. She exhibits no tenderness.  Hoarse  Abdominal: She exhibits no distension and no mass. There is no tenderness.  Musculoskeletal: Normal range of motion. She exhibits no edema or tenderness.  Scar in lumbar area secondary prior surgery. Some difficulty lying down and getting up from the exam table.  Lymphadenopathy:    She has no cervical adenopathy.  Neurological: She is alert and oriented to person, place, and time. No cranial nerve deficit. Coordination normal.  Skin: No rash noted. She is not diaphoretic. No erythema. No pallor.  Multiple seborrheic keratoses the temple and on the back. Varicose veins.   Psychiatric:  She has a normal mood and affect. Her behavior is normal. Judgment and thought content normal.     Labs reviewed: Lab Summary Latest Ref Rng 04/03/2015  Hemoglobin 12.0 - 16.0 g/dL 12.0  Hematocrit 36 - 46 % 36  White count - 7.1  Platelet count 150 - 399 K/L 269  Sodium 137 - 147 mmol/L 139  Potassium 3.4 - 5.3 mmol/L 4.6  Calcium - (None)  Phosphorus - (None)  Creatinine 0.5 - 1.1 mg/dL 0.9  AST 13 - 35 U/L 18  Alk Phos 25 - 125 U/L 85  Bilirubin - (None)  Glucose - 71  Cholesterol - (None)  HDL cholesterol - (None)  Triglycerides - (None)  LDL Direct - (None)  LDL Calc - (None)  Total protein - (None)  Albumin - (None)   Lab Results  Component Value Date   TSH 3.38 07/16/2014   Lab Results  Component Value Date   BUN 14 04/03/2015   BUN 15 07/16/2014   BUN 16 04/05/2014   Lab Results  Component Value Date   CREATININE 0.9 04/03/2015   CREATININE 0.5 07/16/2014   CREATININE 0.8 04/05/2014   Lab Results  Component Value Date   HGBA1C 6.6* 04/05/2014       Assessment/Plan  1. Dysphagia, pharyngoesophageal phase -Discontinue Carafate -Start Gaviscon 30 mL between meals and again at bedtime. -GI referral. Will attempt to refer to the [facility] per patient request. Patient may need EGD to determine etiology of current problems. She had no response to PPI and only partial relief of symptoms by using Carafate.  2. Hoarseness Unchanged. Patient has seen Dr. Benjamine Mola in the past. He referred her to gastroenterology in [place], but she has not scheduled this appointment.  3. Loss of weight Stable over the last 3 months

## 2015-05-26 ENCOUNTER — Encounter: Payer: Self-pay | Admitting: Internal Medicine

## 2015-05-26 ENCOUNTER — Ambulatory Visit (INDEPENDENT_AMBULATORY_CARE_PROVIDER_SITE_OTHER): Payer: Medicare Other | Admitting: Internal Medicine

## 2015-05-26 VITALS — BP 141/65 | HR 70 | Ht 61.0 in | Wt 104.2 lb

## 2015-05-26 DIAGNOSIS — I6523 Occlusion and stenosis of bilateral carotid arteries: Secondary | ICD-10-CM

## 2015-05-26 DIAGNOSIS — I868 Varicose veins of other specified sites: Secondary | ICD-10-CM | POA: Diagnosis not present

## 2015-05-26 DIAGNOSIS — E7800 Pure hypercholesterolemia, unspecified: Secondary | ICD-10-CM | POA: Diagnosis not present

## 2015-05-26 DIAGNOSIS — I482 Chronic atrial fibrillation, unspecified: Secondary | ICD-10-CM

## 2015-05-26 DIAGNOSIS — I8390 Asymptomatic varicose veins of unspecified lower extremity: Secondary | ICD-10-CM

## 2015-05-26 NOTE — Patient Instructions (Signed)
Your physician recommends that you schedule a follow-up appointment in FOUR MONTHS with Dr. Hilty.  

## 2015-05-27 NOTE — Progress Notes (Signed)
OFFICE NOTE  Chief Complaint:  Establish new cardiologist  Primary Care Physician: Estill Dooms, MD  HPI:  Vanessa [name] is a 82 y.o. female who is a new patient of mine and was previously followed by Dr. [name]. She had a long-standing relationship with Dr. Mare Ferrari and since he recently retired has establish care with me. I have reviewed his extensive notes which indicate Vanessa [name] has a history of atrial fibrillation, GI bleeding in the past, 100% occlusion of the left internal carotid artery (followed by vascular surgery, and varicose veins. She also has dyslipidemia and a number of other noncardiac problems. In general, however she seems to do quite well. She is in assisted living at friend's home but is quite independent. Unfortunately, one of her sons currently has a cancer and does not seem to have a good prognosis. She does have another son who lives in [place] who she visited with over the Mother's Day weekend. She denies any chest pain or worsening shortness of breath recently. Her A. fib is chronic and she is rate controlled. She is not on anticoagulation again due to GI bleeding in the past and high risk for future bleeding and therefore only takes aspirin. Given her age, she is not a candidate for the Watchman left atrial appendage occluder device.  PMHx:  Past Medical History  Diagnosis Date  . Atrial fibrillation ([facility])   . Neuropathy ([facility])     PERIPHERAL  . Carotid artery occlusion     LEFT  . Scoliosis   . Hoarseness   . Headache(784.0)   . Dizziness   . Abdominal bloating   . Bruises easily   . Pruritus   . Hypercholesterolemia   . Fall at home Sept 2013, Dec. 2013  Jun 08, 2012  . Varicose veins     Past Surgical History  Procedure Laterality Date  . Abdominal hysterectomy  1954  . Spine surgery  1997    correct scoliosis  . Cholecystectomy  1997    FAMHx:  Family History  Problem Relation Age of Onset  . Heart disease    .  Diabetes Mother   . Heart attack Mother   . Heart disease Mother     After age 24  . Heart attack Father   . Stroke Father   . Cancer Sister     Breast  . Heart disease Maternal Grandmother     SOCHx:   reports that she has never smoked. She has never used smokeless tobacco. She reports that she does not drink alcohol or use illicit drugs.  ALLERGIES:  Allergies  Allergen Reactions  . Tape Other (See Comments)    Tears skin. Band-Aids    ROS: Pertinent items noted in HPI and remainder of comprehensive ROS otherwise negative.  HOME MEDS: Current Outpatient Prescriptions  Medication Sig Dispense Refill  . ALPRAZolam (XANAX) 0.25 MG tablet Take 0.25 mg by mouth daily as needed for anxiety. Reported on 02/11/2015    . aspirin 81 MG tablet Take 81 mg by mouth daily.    Marland Kitchen atenolol (TENORMIN) 25 MG tablet Take 1 tablet (25 mg total) by mouth 2 (two) times daily. 180 tablet 3  . Biotin 5000 MCG SUBL Place 1 tablet under the tongue daily.    . Calcium-Vitamin D-Vitamin K 500-500-40 MG-UNT-MCG CHEW Chew 1 tablet by mouth at bedtime. Chew one chew  at bedtime for calcium    . gabapentin (NEURONTIN) 100 MG capsule Take 100 mg  by mouth at bedtime. Reported on 02/11/2015    . Multiple Vitamins-Minerals (CERTAVITE SENIOR/ANTIOXIDANT PO) Take by mouth. Take one tablet by mouth daily    . simvastatin (ZOCOR) 40 MG tablet Take 20 mg by mouth daily. Take 1/2 tablet to equal 20  Mg.     No current facility-administered medications for this visit.    LABS/IMAGING: No results found for this or any previous visit (from the past 48 hour(s)). No results found.  WEIGHTS: Wt Readings from Last 3 Encounters:  05/26/15 104 lb 3.2 oz (47.265 kg)  04/29/15 102 lb (46.267 kg)  04/15/15 103 lb 6.4 oz (46.902 kg)    VITALS: BP 141/65 mmHg  Pulse 70  Ht 5\' 1"  (1.549 m)  Wt 104 lb 3.2 oz (47.265 kg)  BMI 19.70 kg/m2  EXAM: General appearance: alert, appears stated age and no distress Neck: no  carotid bruit and no JVD Lungs: clear to auscultation bilaterally Heart: irregularly irregular rhythm Abdomen: soft, non-tender; bowel sounds normal; no masses,  no organomegaly Extremities: extremities normal, atraumatic, no cyanosis or edema and varicose veins noted Pulses: 2+ and symmetric Skin: Skin color, texture, turgor normal. No rashes or lesions Neurologic: Grossly normal Psych: Pleasant  EKG: Atrial fibrillation at 70  ASSESSMENT: 1. Chronic atrial fibrillation 2. CHADSVASC score of 6-not anticoagulated due to GI bleeding in the past 3. Dyslipidemia 4. Occluded left carotid artery  PLAN: 1.   Overall Vanessa [name] seems to be doing well despite her age and multiple medical problems. She is in chronic A. fib but not aware of it. She is not anticoagulated for her A. fib due to GI bleeding in the past. She is on low-dose aspirin. She has a chronically occluded left carotid artery which is followed by vascular surgery. There is also dyslipidemia which has been assessed by Dr. Nyoka Cowden and associates. Plan to see her back in 4 months per her request.  Pixie Casino, MD, [facility] Attending Cardiologist [place] 05/27/2015, 6:48 PM

## 2015-05-28 ENCOUNTER — Ambulatory Visit (INDEPENDENT_AMBULATORY_CARE_PROVIDER_SITE_OTHER): Payer: Medicare Other | Admitting: Gastroenterology

## 2015-05-28 ENCOUNTER — Encounter: Payer: Self-pay | Admitting: Gastroenterology

## 2015-05-28 VITALS — BP 108/70 | HR 62 | Ht 61.0 in | Wt 103.0 lb

## 2015-05-28 DIAGNOSIS — R1013 Epigastric pain: Secondary | ICD-10-CM

## 2015-05-28 MED ORDER — OMEPRAZOLE 40 MG PO CPDR: 40.0000 mg | DELAYED_RELEASE_CAPSULE | Freq: Every day | ORAL | Status: DC

## 2015-05-28 NOTE — Patient Instructions (Addendum)
We will get records sent from your previous gastroenterologist Dr. Cristina Gong for review.  This will include any endoscopic (colonoscopy or upper endoscopy) procedures and any associated pathology reports.  Also any recent office notes. Please restart omeprazole 40mg  one pill once daily. A new prescription.  This is best taken 20-30 min prior to breakfast meal. Please return to see Dr. Ardis Hughs in 2 months, please call [phone] in a few weeks to set up this appointment.

## 2015-05-28 NOTE — Progress Notes (Signed)
HPI: This is a   pleasant 82 year old woman    who was referred to me by Estill Dooms, MD  to evaluate  dyspepsia .    Chief complaint is dyspepsia, dysphagia   Tells me she has a belly ache.  This has been going on for several weeks. carafate helped somewhat, 'calmed it down.'    This morning after her usual one bowl of oatmeal she felt bloated, full.  That improved by the time of this visit.  Her son has Stage IV cancer of some sort and does not want any treatments I offer to interfere with her visiting him.  She has had long history of swallowing difficulty.  But has learned how to chew better, swallow well, takes her medicines with applesauce.  She feels she needs upper endoscopy.  NOt sure if she's ever had that.    She has seen Dr. Cristina Gong in the past, not really sure when she last saw him or what he's done for her.  She said he turned her over to his PA and she doesn't like that, wants to see a doctor.  She reviewed her calendar and knows she was seen at [facility] just a few months ago actually.   07/2014 Barium esophagram: Tight cricopharyngeus muscle causing narrowing of the lumen. This impeded passage of barium tablet for 5 minutes.Mild gastroesophageal reflux.   The following is cut-pasted from 03/2015 PCP note by Dr. Elder Negus: "Abdominal pain, chronic, epigastric - epigastric discomfort has been present for several months. She has been seeing Dr. Cristina Gong and a PA at his office. She doesn't feel like anything is being done to give her adequate relief. Patient had upper GI bleed about 18 months ago and was hospitalized. She did not want EGD at that time. She has been taking omeprazole 20 mg daily since then. Patient is now concerned that she might have cancer of the pancreas. She has however gained weight. She has gone up 5 pounds since June 2000. She has no history of previous EGD or colonoscopy. In 08/09/2014 esophageal x-rays showed a tight cricopharyngeal muscle.On  09/10/2013 CT of the abdomen showed a cyst in the right ovary, but was otherwise unremarkable.On 12/29/2011 she had a modified barium swallowing study during which a tablet lodged in the upper esophagus. Patient has seen Dr. Benjamine Mola and Dr. Ernesto Rutherford for complaints about swallowing.he was to be seen in [place] at one point, but has not followed through on that."   12/2011 Speech, language notes: SLP Assessment/Plan Dysphagia Diagnosis: Moderate cervical esophageal phase dysphagia Clinical impression: Patient exhibits a normal orophayrngeal swallow, but a moderated cervical esophageal dysphagia, with a prominent cricopharyngeus, and a small Zenkker's diverticulm below the UES. The pill lodged sideways near the level of the Zenker's, causing discomfort and anxiety to the patient. Patient reports this occurs with meats and occassionally pills at home. After 5 minutes and multiple swallows of puree, water, and nectar, the pill finally dissolved enough to pass. Previously documented esophageal motility disorder persists. GI f/u with Dr. Cristina Gong may be beneficial. Dr. Alvester Chou, Radiologist, observed view of pill that was lodged, and observed patient take 3 swallows of barium after pill passed. He agrees with above.  Review of systems: Pertinent positive and negative review of systems were noted in the above HPI section. Complete review of systems was performed and was otherwise normal.   Past Medical History  Diagnosis Date  . Atrial fibrillation ([place])   . Neuropathy ([facility])     PERIPHERAL  .  Carotid artery occlusion     LEFT  . Scoliosis   . Hoarseness   . Headache(784.0)   . Dizziness   . Abdominal bloating   . Bruises easily   . Pruritus   . Hypercholesterolemia   . Fall at home Sept 2013, Dec. 2013  Jun 08, 2012  . Varicose veins     Past Surgical History  Procedure Laterality Date  . Abdominal hysterectomy  1954  . Spine surgery  1997    correct scoliosis  . Cholecystectomy   1997    Current Outpatient Prescriptions  Medication Sig Dispense Refill  . ALPRAZolam (XANAX) 0.25 MG tablet Take 0.25 mg by mouth daily as needed for anxiety. Reported on 02/11/2015    . aspirin 81 MG tablet Take 81 mg by mouth daily.    Marland Kitchen atenolol (TENORMIN) 25 MG tablet Take 1 tablet (25 mg total) by mouth 2 (two) times daily. 180 tablet 3  . Biotin 5000 MCG SUBL Place 1 tablet under the tongue daily.    . Calcium-Vitamin D-Vitamin K 500-500-40 MG-UNT-MCG CHEW Chew 1 tablet by mouth at bedtime. Chew one chew  at bedtime for calcium    . gabapentin (NEURONTIN) 100 MG capsule Take 100 mg by mouth at bedtime. Reported on 02/11/2015    . Multiple Vitamins-Minerals (CERTAVITE SENIOR/ANTIOXIDANT PO) Take by mouth. Take one tablet by mouth daily    . simvastatin (ZOCOR) 40 MG tablet Take 20 mg by mouth daily. Take 1/2 tablet to equal 20  Mg.     No current facility-administered medications for this visit.    Allergies as of 05/28/2015 - Review Complete 05/28/2015  Allergen Reaction Noted  . Tape Other (See Comments) 04/29/2011    Family History  Problem Relation Age of Onset  . Heart disease    . Diabetes Mother   . Heart attack Mother   . Heart disease Mother     After age 32  . Heart attack Father   . Stroke Father   . Cancer Sister     Breast  . Heart disease Maternal Grandmother     Social History   Social History  . Marital Status: Widowed    Spouse Name: N/A  . Number of Children: 2  . Years of Education: 12+   Occupational History  . 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 Smoking status: Never Smoker   . Smokeless tobacco: Never Used  . Alcohol Use: No  . Drug Use: No  . Sexual Activity: No   Other Topics Concern  . Not on file   Social History Narrative   Patient lives at [facility] 03/05/2014   Patient is a widowed.    Patient is retired.    Patient has no children but adopted twin  sons.    Patient has a college degree.    Never smoked   Alcohol none   Exercise with therapy   Wants with cane   POA       Patient only drinks de-caf drinks.   Patient is right handed.     Physical Exam: BP 108/70 mmHg  Pulse 62  Ht 5\' 1"  (1.549 m)  Wt 103 lb (46.72 kg)  BMI 19.47 kg/m2 Constitutional: generally well-appearing Psychiatric: alert and oriented x3 Eyes: extraocular movements intact Mouth: oral pharynx moist, no lesions Neck: supple no lymphadenopathy Cardiovascular: heart regular rate and rhythm Lungs: clear to auscultation bilaterally Abdomen: soft, nontender,  nondistended, no obvious ascites, no peritoneal signs, normal bowel sounds Extremities: no lower extremity edema bilaterally Skin: no lesions on visible extremities   Assessment and plan: 82 y.o. female with  dyspepsia, dysphagia  First she tells me that her dysphasia is chronic. She is not very bothered by it since she has started doing some compensation strategies with chewing and eating slowly. She takes her a pills with applesauce. Think that is a great solution for her overall issue of dysphagia. She did have a small Zenker's diverticulum and narrowing in her cervical esophagus by her upper esophageal sphincter. That is chronic and very unlikely related to malignancy. She has been offered ENT tertiary referrals but she has not followed through with those. For her dyspepsia she is pretty clear that Carafate helped but she does not want to take anymore due to cost. I recommended she get back on proton pump inhibitor one pill once daily and she'll return to see me in 2 months to see how she responds to that. We have given her a new prescription. We will get records from her previous gastroenterologist at [place] for review here. At 82 years old I explained to her that I would like to proceed with invasive procedures only if very clear indication, urgent issues.   Owens Loffler, MD [facility] 05/28/2015, 11:16 AM  Cc: Estill Dooms, MD

## 2015-06-10 ENCOUNTER — Encounter: Payer: Medicare Other | Admitting: Internal Medicine

## 2015-06-12 ENCOUNTER — Telehealth: Payer: Self-pay | Admitting: Internal Medicine

## 2015-06-12 ENCOUNTER — Ambulatory Visit (INDEPENDENT_AMBULATORY_CARE_PROVIDER_SITE_OTHER): Payer: Medicare Other | Admitting: Internal Medicine

## 2015-06-12 ENCOUNTER — Encounter: Payer: Self-pay | Admitting: Internal Medicine

## 2015-06-12 VITALS — BP 116/74 | HR 60 | Ht 62.0 in | Wt 102.0 lb

## 2015-06-12 DIAGNOSIS — R0789 Other chest pain: Secondary | ICD-10-CM | POA: Diagnosis not present

## 2015-06-12 DIAGNOSIS — I6523 Occlusion and stenosis of bilateral carotid arteries: Secondary | ICD-10-CM | POA: Diagnosis not present

## 2015-06-12 DIAGNOSIS — E7800 Pure hypercholesterolemia, unspecified: Secondary | ICD-10-CM

## 2015-06-12 DIAGNOSIS — I482 Chronic atrial fibrillation, unspecified: Secondary | ICD-10-CM

## 2015-06-12 DIAGNOSIS — R079 Chest pain, unspecified: Secondary | ICD-10-CM | POA: Insufficient documentation

## 2015-06-12 NOTE — Progress Notes (Signed)
OFFICE NOTE  Chief Complaint:  Chest pain  Primary Care Physician: Estill Dooms, MD  HPI:  Vanessa [name] is a 82 y.o. female who is a new patient of mine and was previously followed by Dr. [name]. She had a long-standing relationship with Dr. Mare Ferrari and since he recently retired has establish care with me. I have reviewed his extensive notes which indicate Vanessa [name] has a history of atrial fibrillation, GI bleeding in the past, 100% occlusion of the left internal carotid artery (followed by vascular surgery, and varicose veins. She also has dyslipidemia and a number of other noncardiac problems. In general, however she seems to do quite well. She is in assisted living at friend's home but is quite independent. Unfortunately, one of her sons currently has a cancer and does not seem to have a good prognosis. She does have another son who lives in [place] who she visited with over the Mother's Day weekend. She denies any chest pain or worsening shortness of breath recently. Her A. fib is chronic and she is rate controlled. She is not on anticoagulation again due to GI bleeding in the past and high risk for future bleeding and therefore only takes aspirin. Given her age, she is not a candidate for the [name] left atrial appendage occluder device.  06/12/2015  Vanessa [name] was seen today for an urgent visit for chest pain. This morning she called into the office and she was noted to be having some chest discomfort. She is been notably anxious recently due to her son who is in [place] and has been having some health issues. Based on the stress and anxiety she was advised to try to take some Xanax to see if it improved her symptoms. She reports some mild improvement but still has some uneasiness in her chest. I asked her to come in for evaluation as she does have a strong family history of coronary disease without any coronary testing in the recent past. EKG today was  reassuring demonstrated no ischemic changes, atrial fibrillation with rate control at 60. She currently reports her pain as a 2-3 out of 10, nonradiating, present at rest and unchanged.  PMHx:  Past Medical History  Diagnosis Date  . Atrial fibrillation ([place])   . Neuropathy ([facility])     PERIPHERAL  . Carotid artery occlusion     LEFT  . Scoliosis   . Hoarseness   . Headache(784.0)   . Dizziness   . Abdominal bloating   . Bruises easily   . Pruritus   . Hypercholesterolemia   . Fall at home Sept 2013, Dec. 2013  Jun 08, 2012  . Varicose veins     Past Surgical History  Procedure Laterality Date  . Abdominal hysterectomy  1954  . Spine surgery  1997    correct scoliosis  . Cholecystectomy  1997    FAMHx:  Family History  Problem Relation Age of Onset  . Heart disease    . Diabetes Mother   . Heart attack Mother   . Heart disease Mother     After age 38  . Heart attack Father   . Stroke Father   . Cancer Sister     Breast  . Heart disease Maternal Grandmother     SOCHx:   reports that she has never smoked. She has never used smokeless tobacco. She reports that she does not drink alcohol or use illicit drugs.  ALLERGIES:  Allergies  Allergen Reactions  .  Tape Other (See Comments)    Tears skin. Band-Aids    ROS: Pertinent items noted in HPI and remainder of comprehensive ROS otherwise negative.  HOME MEDS: Current Outpatient Prescriptions  Medication Sig Dispense Refill  . ALPRAZolam (XANAX) 0.25 MG tablet Take 0.25 mg by mouth daily as needed for anxiety. Reported on 02/11/2015    . aspirin 81 MG tablet Take 81 mg by mouth daily.    Marland Kitchen atenolol (TENORMIN) 25 MG tablet Take 1 tablet (25 mg total) by mouth 2 (two) times daily. 180 tablet 3  . Biotin 5000 MCG SUBL Place 1 tablet under the tongue daily.    . Calcium-Vitamin D-Vitamin K 500-500-40 MG-UNT-MCG CHEW Chew 1 tablet by mouth at bedtime. Chew one chew  at bedtime for calcium    . gabapentin (NEURONTIN)  100 MG capsule Take 100 mg by mouth at bedtime. Reported on 02/11/2015    . Multiple Vitamins-Minerals (CERTAVITE SENIOR/ANTIOXIDANT PO) Take by mouth. Take one tablet by mouth daily    . omeprazole (PRILOSEC) 40 MG capsule Take 1 capsule (40 mg total) by mouth daily. 90 capsule 3  . simvastatin (ZOCOR) 40 MG tablet Take 20 mg by mouth daily. Take 1/2 tablet to equal 20  Mg.     No current facility-administered medications for this visit.    LABS/IMAGING: No results found for this or any previous visit (from the past 48 hour(s)). No results found.  WEIGHTS: Wt Readings from Last 3 Encounters:  06/12/15 102 lb (46.267 kg)  05/28/15 103 lb (46.72 kg)  05/26/15 104 lb 3.2 oz (47.265 kg)    VITALS: BP 116/74 mmHg  Pulse 60  Ht 5\' 2"  (1.575 m)  Wt 102 lb (46.267 kg)  BMI 18.65 kg/m2  EXAM: General appearance: alert, appears stated age and no distress Neck: no carotid bruit and no JVD Lungs: clear to auscultation bilaterally Heart: irregularly irregular rhythm Abdomen: soft, non-tender; bowel sounds normal; no masses,  no organomegaly Extremities: extremities normal, atraumatic, no cyanosis or edema and varicose veins noted Pulses: 2+ and symmetric Skin: Skin color, texture, turgor normal. No rashes or lesions Neurologic: Grossly normal Psych: Pleasant  EKG: Atrial fibrillation at 60  ASSESSMENT: 1. Atypical chest pain 2. Anxiety 3. Chronic atrial fibrillation 4. CHADSVASC score of 6-not anticoagulated due to GI bleeding in the past 5. Dyslipidemia 6. Occluded left carotid artery  PLAN: 1.   Overall Vanessa [name] is describing some atypical type chest pain which was partially improved by anxiolytics. I think this is anxiety related particularly with concern about her family member who is been ill. Her EKG shows no ischemic changes. I asked her to monitor her symptoms and if they worsen to call us back and we could consider a stress test. At this time I would not recommend  any further treatment other than continuing her current medications. She is scheduled to follow-up with me in September.  Pixie Casino, MD, [facility] Attending Cardiologist [facility] 06/12/2015, 5:59 PM

## 2015-06-12 NOTE — Telephone Encounter (Signed)
Pt calling re SOB-off and on starting today- chest heaviness-no pain-denies any other symptoms -pt having some anxiety over her son having cancer-feels symptoms may be related to anxiety -pls call  [phone]

## 2015-06-12 NOTE — Patient Instructions (Signed)
Follow up with Dr. Debara Pickett in September  Your physician recommends that you continue on your current medications as directed. Please refer to the Current Medication list given to you today.

## 2015-06-12 NOTE — Telephone Encounter (Signed)
Returned pt call. She is c/o nervousness, "vibrating all over"/jittery, and "chest a little heavy". She does note hx of anxiety and ongoing concern for her son, who is very ill and lives out of state.  Patient herself has no noted CAD but does have heart disease hx in family. She has hx of A Fib.  Discussed w/ her, she wants to try one of her Xanax tablets, which she has not taken but has on-hand PRN for anxiety symptoms. I gave her recommendation initially to go to ER if her symptoms don't improve. Pt does not wish to go to the ER, felt she should be seen in office though. I discussed w/ Dr. Debara Pickett who is agreeable to have her back in office for available afternoon appt (last seen 5/15) - and have EKG done.  I have sent a msg to Billie to add patient to spot (had blocked for tentative addition). I have left msg at patient home to call back.

## 2015-06-12 NOTE — Telephone Encounter (Signed)
Called patient again and got in touch w/ her. Spoke w/ her. She acknowledged appt time - will come in for eval. States the Xanax taken earlier helped a bit.

## 2015-06-12 NOTE — Telephone Encounter (Addendum)
Operator: Please make sure pt is aware of today's appt info if she calls back. [sees Dr. Debara Pickett 3:30pm]

## 2015-06-24 DIAGNOSIS — L570 Actinic keratosis: Secondary | ICD-10-CM | POA: Diagnosis not present

## 2015-06-24 DIAGNOSIS — L820 Inflamed seborrheic keratosis: Secondary | ICD-10-CM | POA: Diagnosis not present

## 2015-06-24 DIAGNOSIS — L299 Pruritus, unspecified: Secondary | ICD-10-CM | POA: Diagnosis not present

## 2015-06-24 DIAGNOSIS — D1801 Hemangioma of skin and subcutaneous tissue: Secondary | ICD-10-CM | POA: Diagnosis not present

## 2015-06-24 DIAGNOSIS — L853 Xerosis cutis: Secondary | ICD-10-CM | POA: Diagnosis not present

## 2015-07-07 DIAGNOSIS — I10 Essential (primary) hypertension: Secondary | ICD-10-CM | POA: Diagnosis not present

## 2015-07-07 DIAGNOSIS — E119 Type 2 diabetes mellitus without complications: Secondary | ICD-10-CM | POA: Diagnosis not present

## 2015-07-07 DIAGNOSIS — E785 Hyperlipidemia, unspecified: Secondary | ICD-10-CM | POA: Diagnosis not present

## 2015-07-07 LAB — LIPID PANEL
Cholesterol: 148 mg/dL (ref 0–200)
HDL: 70 mg/dL (ref 35–70)
LDL Cholesterol: 58 mg/dL
Triglycerides: 100 mg/dL (ref 40–160)

## 2015-07-07 LAB — BASIC METABOLIC PANEL
BUN: 14 mg/dL (ref 4–21)
Creatinine: 0.9 mg/dL (ref 0.5–1.1)
Glucose: 84 mg/dL
Potassium: 4.7 mmol/L (ref 3.4–5.3)
Sodium: 139 mmol/L (ref 137–147)

## 2015-07-07 LAB — HEMOGLOBIN A1C: Hemoglobin A1C: 6.4

## 2015-07-08 ENCOUNTER — Encounter: Payer: Medicare Other | Admitting: Internal Medicine

## 2015-07-09 ENCOUNTER — Telehealth: Payer: Self-pay | Admitting: Gastroenterology

## 2015-07-10 NOTE — Telephone Encounter (Signed)
Pt notified of appt 09/17/15 and she is on the wait list I will also send a note to Dr Ardis Hughs to see if we can refill her carafate and try and get samples or pt assistance.  The pt stated that she had good results when Dr Cristina Gong prescribed it but could not afford it.  She is also taking omeprazole 20-30 min prior to breakfast.  Please advise

## 2015-07-10 NOTE — Telephone Encounter (Signed)
Left message on machine to call back  

## 2015-07-13 NOTE — Telephone Encounter (Signed)
Ok, if it's helped in the past lets try carafate 4gm liquid, one dose tid with meals, disp 1 month, 6 refills    Thanks

## 2015-07-14 NOTE — Telephone Encounter (Signed)
Pt notified that I am working on trying to get her carafate samples, she in not able to afford carafate prescription.  She will begin omeprazole twice daily.  20-30 min prior to meals.  She was added to the wait list.

## 2015-07-16 ENCOUNTER — Other Ambulatory Visit: Payer: Self-pay | Admitting: Internal Medicine

## 2015-07-18 ENCOUNTER — Other Ambulatory Visit: Payer: Self-pay

## 2015-07-18 NOTE — Telephone Encounter (Signed)
I spoke with Magda Paganini and she will call and try and get samples of carfate for the pt.

## 2015-07-18 NOTE — Telephone Encounter (Signed)
-----   Message from Barron Alvine, RN sent at 07/14/2015 10:03 AM EDT ----- Waiting for carafate samples

## 2015-07-21 NOTE — Telephone Encounter (Signed)
Magda Paganini any news on the carafate samples?

## 2015-07-22 DIAGNOSIS — L821 Other seborrheic keratosis: Secondary | ICD-10-CM | POA: Diagnosis not present

## 2015-07-22 DIAGNOSIS — L570 Actinic keratosis: Secondary | ICD-10-CM | POA: Diagnosis not present

## 2015-07-22 DIAGNOSIS — D1801 Hemangioma of skin and subcutaneous tissue: Secondary | ICD-10-CM | POA: Diagnosis not present

## 2015-07-22 DIAGNOSIS — L820 Inflamed seborrheic keratosis: Secondary | ICD-10-CM | POA: Diagnosis not present

## 2015-07-24 NOTE — Telephone Encounter (Signed)
Magda Paganini notified the pt to come in for carafate samples.

## 2015-07-25 ENCOUNTER — Telehealth: Payer: Self-pay | Admitting: Gastroenterology

## 2015-07-25 NOTE — Telephone Encounter (Signed)
Pt has been notified that the Carafate samples have been ordered and will arrive the first of next week.  She is going out of town to see her son who has cancer.  I will leave a message on her machine to pick up when she returns

## 2015-08-12 ENCOUNTER — Telehealth: Payer: Self-pay | Admitting: Gastroenterology

## 2015-08-12 DIAGNOSIS — L309 Dermatitis, unspecified: Secondary | ICD-10-CM | POA: Diagnosis not present

## 2015-08-12 NOTE — Telephone Encounter (Signed)
Colonoscopy 10/2002 Dr. Cristina Gong; done for change in bowels, colon cancer screening: findings 70mm polyp; sigmoid muscular thickening with tics- pathology no adenomatous changes EGD 10/2002 Dr. Cristina Gong; "hemorragic gastritis, small hiatal herni with ring" pathology "unremarkable small bowel."

## 2015-08-13 ENCOUNTER — Telehealth: Payer: Self-pay | Admitting: Gastroenterology

## 2015-08-13 NOTE — Telephone Encounter (Signed)
Left message on machine to call back  

## 2015-08-14 NOTE — Telephone Encounter (Signed)
Patient returned phone call. Best# [phone]

## 2015-08-14 NOTE — Telephone Encounter (Signed)
Left message on machine to call back  

## 2015-08-15 NOTE — Telephone Encounter (Signed)
Left message on machine to call back  

## 2015-08-19 ENCOUNTER — Other Ambulatory Visit: Payer: Self-pay | Admitting: Internal Medicine

## 2015-08-19 NOTE — Telephone Encounter (Signed)
Left message on machine to call back  

## 2015-08-22 NOTE — Telephone Encounter (Signed)
Unable to reach pt message left for pt to return call if she has any questions or concerns.

## 2015-08-25 ENCOUNTER — Telehealth: Payer: Self-pay | Admitting: *Deleted

## 2015-08-25 NOTE — Telephone Encounter (Signed)
Patient called requesting to see Dr Scot Dock for her swollen varicosity in her lower leg.  She states that it is very sore to touch and she had to discontinue wearing the compression stockings due to the soreness  and pain.  An appointment was scheduled with Vinnie Level Nickel  NP 08/27/2015 AT 2:45.

## 2015-08-26 ENCOUNTER — Encounter: Payer: Self-pay | Admitting: Family

## 2015-08-27 ENCOUNTER — Ambulatory Visit (INDEPENDENT_AMBULATORY_CARE_PROVIDER_SITE_OTHER): Payer: Medicare Other | Admitting: Family

## 2015-08-27 ENCOUNTER — Encounter: Payer: Self-pay | Admitting: Family

## 2015-08-27 VITALS — BP 114/58 | HR 112 | Temp 97.8°F | Resp 14 | Ht 61.0 in | Wt 100.0 lb

## 2015-08-27 DIAGNOSIS — I8003 Phlebitis and thrombophlebitis of superficial vessels of lower extremities, bilateral: Secondary | ICD-10-CM | POA: Diagnosis not present

## 2015-08-27 DIAGNOSIS — I6523 Occlusion and stenosis of bilateral carotid arteries: Secondary | ICD-10-CM | POA: Diagnosis not present

## 2015-08-27 DIAGNOSIS — I6522 Occlusion and stenosis of left carotid artery: Secondary | ICD-10-CM | POA: Diagnosis not present

## 2015-08-27 DIAGNOSIS — I8393 Asymptomatic varicose veins of bilateral lower extremities: Secondary | ICD-10-CM

## 2015-08-27 DIAGNOSIS — I6521 Occlusion and stenosis of right carotid artery: Secondary | ICD-10-CM | POA: Diagnosis not present

## 2015-08-27 NOTE — Progress Notes (Signed)
Chief Complaint: Follow up Extracranial Carotid Artery Stenosis   History of Present Illness  Vanessa [name] is a 82 y.o. female patient that Dr. Scot Dock has been monitoring for carotid artery stenosis; she has a known left internal carotid artery occlusion and minimal right ICA stenosis.  She returns today with c/o small patches of pruritic rash at both ankles, and at her left wrist under her watch. These seem to be improving slightly. She also reports resolving tenderness over about 2-3 inches of superficial varicosities of both feet/ankles.  She has lower legs varicosities with no edema. She spoke with our vein clinic nurse in the past re her varicosities and was given a pair of compression hose, but cannot get them on due to the weakness in her wrists. Vanessa [name], vein clinic nurse, spoke with pt again and discussed donning compression hose.  Patient has not had previous carotid artery intervention.  She denies any history of TIA or stroke symptoms.Specifically she denies a history of amaurosis fugax or monocular blindness, unilateral facial drooping, hemiplegia, or receptive or expressive aphasia.  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  The hematoma on her left lower leg has resolved.  She resides at [facility].   Pt Diabetic: No Pt smoker: non-smoker  Pt meds include: Statin : Yes ASA: yes Other anticoagulants/antiplatelets: none, since she has a history of GI bleed, has chronic atrial fib  Past Medical History:  Diagnosis Date  . Abdominal bloating   . Atrial fibrillation ([place])   . Bruises easily   . Carotid artery occlusion    LEFT  . Dizziness   . Fall at home Sept 2013, Dec. 2013  Jun 08, 2012  . GERD (gastroesophageal reflux disease) 02/11/2015  . Headache(784.0)   . Hoarseness   . Hypercholesterolemia   . Hyperglycemia 04/02/2014   Glucose 178 mg percent on 01/22/2014  04/05/14 Hgb A1c 6.6 Diet controlled.    . Hypothyroidism 04/15/2015  . Neuropathy ([facility])    PERIPHERAL  . Pruritus   . Scoliosis   . Varicose veins     Social History Social History  Substance Use Topics  . Smoking status: Never Smoker  . Smokeless tobacco: Never Used  . Alcohol use No    Family History Family History  Problem Relation Age of Onset  . Heart disease    . Diabetes Mother   . Heart attack Mother   . Heart disease Mother     After age 53  . Heart attack Father   . Stroke Father   . Cancer Sister     Breast  . Heart disease Maternal Grandmother     Surgical History Past Surgical History:  Procedure Laterality Date  . ABDOMINAL HYSTERECTOMY  1954  . CHOLECYSTECTOMY  1997  . SPINE SURGERY  1997   correct scoliosis    Allergies  Allergen Reactions  . Tape Other (See Comments)    Tears skin. Band-Aids    Current Outpatient Prescriptions  Medication Sig Dispense Refill  . ALPRAZolam (XANAX) 0.25 MG tablet Take 0.25 mg by mouth daily as needed for anxiety. Reported on 02/11/2015    . aspirin 81 MG tablet Take 81 mg by mouth daily.    Marland Kitchen atenolol (TENORMIN) 25 MG tablet Take 1 tablet (25 mg total) by mouth 2 (two) times daily. 180 tablet 3  . Biotin 5000 MCG SUBL Place 1 tablet under the tongue daily.    Marland Kitchen  Calcium-Vitamin D-Vitamin K [id number] MG-UNT-MCG CHEW Chew 1 tablet by mouth at bedtime. Chew one chew  at bedtime for calcium    . gabapentin (NEURONTIN) 100 MG capsule TAKE 1 CAPSULE AT BEDTIME. 30 capsule 3  . Multiple Vitamins-Minerals (CERTAVITE SENIOR/ANTIOXIDANT PO) Take by mouth. Take one tablet by mouth daily    . omeprazole (PRILOSEC) 40 MG capsule Take 1 capsule (40 mg total) by mouth daily. 90 capsule 3  . simvastatin (ZOCOR) 40 MG tablet TAKE (1/2) TABLET DAILY. 45 tablet 0   No current facility-administered medications for this visit.     Review of Systems : See HPI for pertinent positives and negatives.  Physical  Examination  Vitals:   08/27/15 1449  BP: (!) 114/58  Pulse: (!) 112  Resp: 14  Temp: 97.8 F (36.6 C)  SpO2: 96%  Weight: 100 lb (45.4 kg)  Height: 5\' 1"  (1.549 m)   Body mass index is 18.89 kg/m.  General: WDWN elderly female in NAD GAIT: normal Eyes: PERRLA Pulmonary: Respirations are non-labored  Cardiac: Irregular rhythm.  VASCULAR EXAM Carotid Bruits Right Left   Negative Negative   Radial pulses are 2+ palpable and equal.      LE Pulses Right Left   POPLITEAL not palpable  not palpable   POSTERIOR TIBIAL 1+ palpable  2+ palpable    DORSALIS PEDIS  ANTERIOR TIBIAL 2+ palpable  2+ palpable     Gastrointestinal: soft, nontender, BS WNL, no r/g, no palpable masses.  Musculoskeletal: Age appropriate generalized muscle atrophy/wasting. M/S 4/5 throughout, Extremities without ischemic changes, varicosities in both lower legs. Small patch of pruritic rash with a center at both ankles and her left wrist under her watch.   Neurologic: A&O X 3; Appropriate Affect, Speech is normal CN 2-12 intact, mild hearing loss, Pain and light touch intact in extremities, Motor exam as listed above.    Assessment: Vanessa [name] is a 82 y.o. female who presents with likely resolved superificial vein phlebitis in both feet/ankles. If these recur, I suggested warm compresses and elevation of her legs.  The small pruritic patches with centers at both ankles seem to be improving according to pt: eczema vs insect bites vs latex reaction from compression hose. The pruritic patch on her left wrist under her watch seems to be from an irritation from her watch. Caladryl suggested to these pruritic areas. She has some triamcinolone which she has been using and seems to be helping. She is  scheduled to return in a couple of weeks for carotid duplex and see me afterward.    Plan: Follow-up on 09/10/15 with Carotid Duplex scan and see me as already scheduled.   I discussed in depth with the patient the nature of atherosclerosis, and emphasized the importance of maximal medical management including strict control of blood pressure, blood glucose, and lipid levels, obtaining regular exercise, and continued cessation of smoking.  The patient is aware that without maximal medical management the underlying atherosclerotic disease process will progress, limiting the benefit of any interventions. The patient was given information about stroke prevention and what symptoms should prompt the patient to seek immediate medical care. Thank you for allowing [place] to participate in this patient's care.  Clemon Chambers, RN, MSN, FNP-C [facility] Office: [phone]  Clinic Physician: Scot Dock  08/27/15 3:05 PM

## 2015-08-27 NOTE — Patient Instructions (Signed)
Phlebitis  Phlebitis is soreness and swelling (inflammation) of a vein. This can occur in your arms, legs, or torso (trunk), as well as deeper inside your body. Phlebitis is usually not serious when it occurs close to the surface of the body. However, it can cause serious problems when it occurs in a vein deeper inside the body.  CAUSES   Phlebitis can be triggered by various things, including:    Reduced blood flow through your veins. This can happen with:    Bed rest over a long period.    Long-distance travel.    Injury.    Surgery.    Being overweight (obese) or pregnant.   Having an IV tube put in the vein and getting certain medicines through the vein.   Cancer and cancer treatment.   Use of illegal drugs taken through the vein.   Inflammatory diseases.   Inherited (genetic) diseases that increase the risk of blood clots.   Hormone therapy, such as birth control pills.  SIGNS AND SYMPTOMS    Red, tender, swollen, and painful area on your skin. Usually, the area will be long and narrow.   Firmness along the center of the affected area. This can indicate that a blood clot has formed.   Low-grade fever.  DIAGNOSIS   A health care provider can usually diagnose phlebitis by examining the affected area and asking about your symptoms. To check for infection or blood clots, your health care provider may order blood tests or an ultrasound exam of the area. Blood tests and your family history may also indicate if you have an underlying genetic disease that causes blood clots. Occasionally, a piece of tissue is taken from the body (biopsy sample) if an unusual cause of phlebitis is suspected.  TREATMENT   Treatment will vary depending on the severity of the condition and the area of the body affected. Treatment may include:   Use of a warm compress or heating pad.   Use of compression stockings or bandages.   Anti-inflammatory medicines.   Removal of any IV tube that may be causing the problem.   Medicines  that kill germs (antibiotics) if an infection is present.   Blood-thinning medicines if a blood clot is suspected or present.   In rare cases, surgery may be needed to remove damaged sections of vein.  HOME CARE INSTRUCTIONS    Only take over-the-counter or prescription medicines as directed by your health care provider. Take all medicines exactly as prescribed.   Raise (elevate) the affected area above the level of your heart as directed by your health care provider.   Apply a warm compress or heating pad to the affected area as directed by your health care provider. Do not sleep with the heating pad.   Use compression stockings or bandages as directed. These will speed healing and prevent the condition from coming back.   If you are on blood thinners:    Get follow-up blood tests as directed by your health care provider.    Check with your health care provider before using any new medicines.    Carry a medical alert card or wear your medical alert jewelry to show that you are on blood thinners.   For phlebitis in the legs:    Avoid prolonged standing or bed rest.    Keep your legs moving. Raise your legs when sitting or lying.   Do not smoke.   Women, particularly those over the age of 35, should consider   the risks and benefits of taking the contraceptive pill. This kind of hormone treatment can increase your risk for blood clots.   Follow up with your health care provider as directed.  SEEK MEDICAL CARE IF:    You have unusual bruising or any bleeding problems.   Your swelling or pain in the affected area is not improving.   You are on anti-inflammatory medicine, and you develop belly (abdominal) pain.  SEEK IMMEDIATE MEDICAL CARE IF:    You have a sudden onset of chest pain or difficulty breathing.   You have a fever or persistent symptoms for more than 2-3 days.   You have a fever and your symptoms suddenly get worse.  MAKE SURE YOU:   Understand these instructions.   Will watch your  condition.   Will get help right away if you are not doing well or get worse.     This information is not intended to replace advice given to you by your health care provider. Make sure you discuss any questions you have with your health care provider.     Document Released: 12/22/2000 Document Revised: 10/18/2012 Document Reviewed: 09/04/2012  Elsevier Interactive Patient Education 2016 [employer]

## 2015-09-02 ENCOUNTER — Encounter: Payer: Self-pay | Admitting: Internal Medicine

## 2015-09-02 ENCOUNTER — Non-Acute Institutional Stay: Payer: Medicare Other | Admitting: Internal Medicine

## 2015-09-02 VITALS — BP 120/62 | HR 68 | Temp 97.8°F | Ht 61.0 in | Wt 99.0 lb

## 2015-09-02 DIAGNOSIS — R634 Abnormal weight loss: Secondary | ICD-10-CM

## 2015-09-02 DIAGNOSIS — M545 Low back pain, unspecified: Secondary | ICD-10-CM

## 2015-09-02 DIAGNOSIS — E039 Hypothyroidism, unspecified: Secondary | ICD-10-CM

## 2015-09-02 DIAGNOSIS — I6523 Occlusion and stenosis of bilateral carotid arteries: Secondary | ICD-10-CM | POA: Diagnosis not present

## 2015-09-02 DIAGNOSIS — E7800 Pure hypercholesterolemia, unspecified: Secondary | ICD-10-CM

## 2015-09-02 DIAGNOSIS — R490 Dysphonia: Secondary | ICD-10-CM

## 2015-09-02 DIAGNOSIS — R1314 Dysphagia, pharyngoesophageal phase: Secondary | ICD-10-CM

## 2015-09-02 DIAGNOSIS — I868 Varicose veins of other specified sites: Secondary | ICD-10-CM | POA: Diagnosis not present

## 2015-09-02 DIAGNOSIS — K219 Gastro-esophageal reflux disease without esophagitis: Secondary | ICD-10-CM

## 2015-09-02 DIAGNOSIS — I482 Chronic atrial fibrillation, unspecified: Secondary | ICD-10-CM

## 2015-09-02 DIAGNOSIS — I8390 Asymptomatic varicose veins of unspecified lower extremity: Secondary | ICD-10-CM

## 2015-09-02 DIAGNOSIS — R739 Hyperglycemia, unspecified: Secondary | ICD-10-CM

## 2015-09-02 DIAGNOSIS — R0789 Other chest pain: Secondary | ICD-10-CM

## 2015-09-02 DIAGNOSIS — F429 Obsessive-compulsive disorder, unspecified: Secondary | ICD-10-CM

## 2015-09-02 NOTE — Progress Notes (Signed)
HISTORY AND PHYSICAL  Location:    [facility] Room Number: QK86 Place of Service: Clinic (12)   Extended Emergency Contact Information Primary Emergency Contact: [place] Phone: [phone] Relation: Other Secondary Emergency Contact: Simmering,Ruffin  [place] Phone: [phone] Mobile Phone: [phone] Relation: Son  Advanced Directive information Does patient have an advance directive?: Yes, Type of Advance Directive: Healthcare Power of Geographical information systems officer Complaint  Patient presents with  . Annual Exam    Wellness exam   . Medical Management of Chronic Issues    thyroid, A-Fib, cholesterol, GERD  . Other    29/30 passed clock drawing    HPI:  Son died of cancer in the esophagus, stomach, and liver.  Now seeing Dr. Ardis Hughs for her stomach problems. Took Carafate from 08/16/15 to 08/23/15. Has not resumed omeprazole. Still has a gurgling in the left lower abdafter she drinks something. She thinks she may be doing a little better. Never feels comfortable in stomachj.  SOB and fatigue are a little better. She is now of the opinion that her son's illness and death were related.  Saw Dr. Scot Dock at [facility]. Has varicosities. She thinks the compression hose were too tight. Also being followed for her carotid occlusion.  Chronic atrial fibrillation (HCC) - rate controlled  Hypothyroidism, unspecified hypothyroidism type - compensated  Carotid stenosis, bilateral - hx of <40% occlusion RICA and LICA  Dysphagia, pharyngoesophageal phase - improved  Dysphonia - chronically lax vocal cords  Loss of weight - Patient has not been eating well due to the problems of her gut  Midline low back pain without sciatica - chronic. Patient can get comfortable if sitting or laying.  Hyperglycemia - normal 07/07/2015  Hypercholesterolemia - controlled  Gastroesophageal reflux disease without esophagitis - patient should  probably be on some sort of acid blocking medication. I asked her to try ranitidine or famotidine.  Other chest pain - resolved  Varicose veins - quite prominent in the right lower leg.  OCD (obsessive compulsive disorder) - no "rituals" but she dwells on things a lot.    Past Medical History:  Diagnosis Date  . Abdominal bloating   . Atrial fibrillation ([place])   . Bruises easily   . Carotid artery occlusion    LEFT  . Dizziness   . Fall at home Sept 2013, Dec. 2013  Jun 08, 2012  . GERD (gastroesophageal reflux disease) 02/11/2015  . Headache(784.0)   . Hoarseness   . Hypercholesterolemia   . Hyperglycemia 04/02/2014   Glucose 178 mg percent on 01/22/2014 04/05/14 Hgb A1c 6.6 Diet controlled.    . Hypothyroidism 04/15/2015  . Neuropathy (HCC)    PERIPHERAL  . Pruritus   . Scoliosis   . Varicose veins     Past Surgical History:  Procedure Laterality Date  . ABDOMINAL HYSTERECTOMY  1954  . CHOLECYSTECTOMY  1997  . [place]   correct scoliosis    Patient Care Team: Estill Dooms, MD as PCP - General (Internal Medicine) Angelia Mould, MD as Attending Physician (Vascular Surgery) Ronald Lobo, MD (Gastroenterology) Harle Battiest, MD as Attending Physician (Obstetrics and Gynecology) Amy Martinique, MD as Consulting Physician (Dermatology) Thornell Sartorius, MD (Otolaryngology) [name], MD (Orthopedic Surgery) [name], MD (Ophthalmology) [name], MD as Consulting Physician (Urology) [name], MD as Consulting Physician (Cardiology) [name], PA-C as Physician Assistant (Otolaryngology) [name], MD as Consulting Physician (Otolaryngology) [facility]  Tonia Brooms, MD as Consulting Physician (Dermatology) [name], MD as Consulting Physician (Otolaryngology) [name], MD as Consulting Physician (Physical Medicine and Rehabilitation) [name], MD as Attending Physician (Gastroenterology)  Social History   Social History  .  Marital status: Widowed    Spouse name: N/A  . Number of children: 2  . Years of education: 12+   Occupational History  . 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 Smoking status: Never Smoker  . Smokeless tobacco: Never Used  . Alcohol use No  . Drug use: No  . Sexual activity: No   Other Topics Concern  . Not on file   Social History Narrative   Patient lives at [facility] 03/05/2014   Patient is a widowed.    Patient is retired.    Patient has no children but adopted twin sons.    Patient has a college degree.    Never smoked   Alcohol none   Exercise with therapy   Wants with cane   POA       Patient only drinks de-caf drinks.   Patient is right handed.    reports that she has never smoked. She has never used smokeless tobacco. She reports that she does not drink alcohol or use drugs.  Family History  Problem Relation Age of Onset  . Diabetes Mother   . Heart attack Mother   . Heart disease Mother     After age 61  . Heart attack Father   . Stroke Father   . Cancer Sister     Breast  . Heart disease Maternal Grandmother   . Heart disease    . Cancer Daughter     stomach/liver  . Cancer Son    Family Status  Relation Status  . Mother Deceased at age 81  . Father Deceased at age 66   Stroke- [facility]  . Sister Deceased at age 67  . Maternal Grandmother Deceased  . Brother Deceased at age 65   per health survey form-multi  . Maternal Grandfather Deceased  . Paternal Grandmother Deceased  . Paternal Grandfather Deceased  .    Marland Kitchen Daughter   . Son Deceased at age 67  . Son Alive    Immunization History  Administered Date(s) Administered  . Influenza Split 10/20/2011, 11/03/2012  . Influenza,inj,Quad PF,36+ Mos 09/29/2012, 11/09/2013  . Influenza-Unspecified 10/17/2014  . PPD Test 08/21/2013, 03/04/2014  . Pneumococcal Conjugate-13 08/21/2013  . Tdap 05/20/2008    Allergies   Allergen Reactions  . Tape Other (See Comments)    Tears skin. Band-Aids    Medications: Patient's Medications  New Prescriptions   No medications on file  Previous Medications   ALPRAZOLAM (XANAX) 0.25 MG TABLET    Take 0.25 mg by mouth daily as needed for anxiety. Reported on 02/11/2015   ASPIRIN 81 MG TABLET    Take 81 mg by mouth daily.   ATENOLOL (TENORMIN) 25 MG TABLET    Take 1 tablet (25 mg total) by mouth 2 (two) times daily.   BIOTIN 5000 MCG SUBL    Place 1 tablet under the tongue daily.   CALCIUM-VITAMIN D-VITAMIN K 500-500-40 MG-UNT-MCG CHEW    Chew 1 tablet by mouth at bedtime. Chew one chew  at bedtime for calcium   GABAPENTIN (NEURONTIN) 100 MG CAPSULE    TAKE 1 CAPSULE AT BEDTIME.   MULTIPLE VITAMINS-MINERALS (CERTAVITE SENIOR/ANTIOXIDANT PO)  Take by mouth. Take one tablet by mouth daily   OMEPRAZOLE (PRILOSEC) 40 MG CAPSULE    Take 1 capsule (40 mg total) by mouth daily.   SIMVASTATIN (ZOCOR) 40 MG TABLET    TAKE (1/2) TABLET DAILY.  Modified Medications   No medications on file  Discontinued Medications   No medications on file    Review of Systems  Constitutional: Negative for chills, diaphoresis and fever.  HENT: Positive for hearing loss. Negative for congestion, ear discharge, ear pain, sore throat and tinnitus.        Chronic sinus congestion. Dentures. Xerostomia.  Eyes: Negative for pain and redness.  Respiratory: Positive for shortness of breath (On exertion). Negative for cough and wheezing.   Cardiovascular: Positive for palpitations. Negative for chest pain and leg swelling.       Hx AF  Gastrointestinal: Negative for abdominal pain, constipation, diarrhea and nausea.       HX GI bleed In Sept 2015. Difficulty swallowing solids. Avoids beef. Barium swallow 08/09/14 showed tight cricopharyngeus muscle, GERD, and mild esophageal dysmotility.  Endocrine: Negative for polydipsia.  Genitourinary: Negative for dysuria, flank pain, frequency, hematuria  and urgency.  Musculoskeletal: Positive for back pain. Negative for myalgias and neck pain.       Hx scoliosis and surgery of the lower back to partially correct it. Medial right knee pain  Skin: Negative for rash.       Chronic pruritus.  Has multiple lesions of the back including irritated seborrheic keratoses, atrophic plaques, multiple angiomas. Sebaceous cyst of the dorsal aspect of the left forearm. BLE varicose veins  Neurological: Positive for dizziness. Negative for tremors, seizures, weakness and headaches.       History of neuropathy with some numbness in the feet.  Hematological: Does not bruise/bleed easily.       Anemia resolved in May 2016  Psychiatric/Behavioral: Negative for hallucinations and suicidal ideas. The patient is nervous/anxious.     Vitals:   09/02/15 1038  BP: 120/62  Pulse: 68  Temp: 97.8 F (36.6 C)  TempSrc: Oral  SpO2: 91%  Weight: 99 lb (44.9 kg)  Height: '5\' 1"'  (1.549 m)   Body mass index is 18.71 kg/m. Filed Weights   09/02/15 1038  Weight: 99 lb (44.9 kg)     Physical Exam  Constitutional: She is oriented to person, place, and time. She appears well-developed and well-nourished. No distress.  Thin, frail.  HENT:  Right Ear: External ear normal.  Left Ear: External ear normal.  Nose: Nose normal.  Mouth/Throat: Oropharynx is clear and moist. No oropharyngeal exudate.  Eyes: Conjunctivae and EOM are normal. Pupils are equal, round, and reactive to light. No scleral icterus.  Neck: No JVD present. No tracheal deviation present. No thyromegaly present.  Cardiovascular: Normal rate, normal heart sounds and intact distal pulses.  Exam reveals no gallop and no friction rub.   No murmur heard. Atrial fibrillation. Controlled rate. Varicose veins in both legs.  Pulmonary/Chest: Effort normal. No respiratory distress. She has no wheezes. She has no rales. She exhibits no tenderness.  Hoarse  Abdominal: She exhibits no distension and no  mass. There is no tenderness.  Musculoskeletal: Normal range of motion. She exhibits no edema or tenderness.  Scar in lumbar area secondary prior surgery. Some difficulty lying down and getting up from the exam table.  Lymphadenopathy:    She has no cervical adenopathy.  Neurological: She is alert and oriented to person, place, and time. No cranial nerve  deficit. Coordination normal.  09/02/2015 MMSE 29/30. Passed clock drawing..  Skin: No rash noted. She is not diaphoretic. No erythema. No pallor.  Multiple seborrheic keratoses the temple and on the back. Varicose veins.   Psychiatric: She has a normal mood and affect. Her behavior is normal. Judgment and thought content normal.    Labs reviewed: Lab Summary Latest Ref Rng & Units 07/07/2015 04/03/2015  Hemoglobin 12.0 - 16.0 g/dL (None) 12.0  Hematocrit 36 - 46 % (None) 36  White count 10:3/mL (None) 7.1  Platelet count 150 - 399 K/L (None) 269  Sodium 137 - 147 mmol/L 139 139  Potassium 3.4 - 5.3 mmol/L 4.7 4.6  Calcium - (None) (None)  Phosphorus - (None) (None)  Creatinine 0.5 - 1.1 mg/dL 0.9 0.9  AST 13 - 35 U/L (None) 18  Alk Phos 25 - 125 U/L (None) 85  Bilirubin - (None) (None)  Glucose mg/dL 84 71  Cholesterol 0 - 200 mg/dL 148 (None)  HDL cholesterol 35 - 70 mg/dL 70 (None)  Triglycerides 40 - 160 mg/dL 100 (None)  LDL Direct - (None) (None)  LDL Calc mg/dL 58 (None)  Total protein - (None) (None)  Albumin - (None) (None)  Some recent data might be hidden   Lab Results  Component Value Date   BUN 14 07/07/2015   Lab Results  Component Value Date   HGBA1C 6.4 07/07/2015   Lab Results  Component Value Date   TSH 3.38 07/16/2014    Assessment/Plan  1. Chronic atrial fibrillation (HCC) Rate controlled but not anticoagulated  2. Hypothyroidism, unspecified hypothyroidism type Compensated  3. Carotid stenosis, bilateral Stable  4. Dysphagia, pharyngoesophageal phase Improved  5. Dysphonia Chronic  and unchanged  6. Loss of weight Weight has been stable  7. Midline low back pain without sciatica Chronic condition unchanged  8. Hyperglycemia Normal glucose of 84 in June 2017  9. Hypercholesterolemia Controlled  10. Gastroesophageal reflux disease without esophagitis Asymptomatic  11. Other chest pain Result  12 varicose veins  Chronic and unchanged  13. OCD (obsessive compulsive disorder) Mild

## 2015-09-05 ENCOUNTER — Encounter: Payer: Self-pay | Admitting: Family

## 2015-09-10 ENCOUNTER — Ambulatory Visit (HOSPITAL_COMMUNITY)
Admission: RE | Admit: 2015-09-10 | Discharge: 2015-09-10 | Disposition: A | Discharge status: Home / Self Care | Payer: Medicare Other | Source: Ambulatory Visit | Attending: Family | Admitting: Family

## 2015-09-10 ENCOUNTER — Encounter: Payer: Self-pay | Admitting: Family

## 2015-09-10 ENCOUNTER — Ambulatory Visit (INDEPENDENT_AMBULATORY_CARE_PROVIDER_SITE_OTHER): Payer: Medicare Other | Admitting: Family

## 2015-09-10 VITALS — BP 122/58 | HR 70 | Temp 97.8°F | Resp 16 | Ht 61.0 in | Wt 100.0 lb

## 2015-09-10 DIAGNOSIS — I6522 Occlusion and stenosis of left carotid artery: Secondary | ICD-10-CM | POA: Diagnosis not present

## 2015-09-10 DIAGNOSIS — I6523 Occlusion and stenosis of bilateral carotid arteries: Secondary | ICD-10-CM

## 2015-09-10 DIAGNOSIS — I8393 Asymptomatic varicose veins of bilateral lower extremities: Secondary | ICD-10-CM | POA: Diagnosis not present

## 2015-09-10 DIAGNOSIS — I6521 Occlusion and stenosis of right carotid artery: Secondary | ICD-10-CM

## 2015-09-10 LAB — VAS US CAROTID
RIGHT CCA MID DIAS: 13 cm/s
Right CCA prox dias: -14 cm/s
Right CCA prox sys: -116 cm/s
Right cca dist sys: -84 cm/s

## 2015-09-10 NOTE — Progress Notes (Signed)
Chief Complaint: Follow up Extracranial Carotid Artery Stenosis   History of Present Illness  Vanessa [name] is a 82 y.o. female patient that Dr. Scot Dock has been monitoring for carotid artery stenosis; she has a known left internal carotid artery occlusion and minimal right ICA stenosis.  I saw her on 08/27/15 with c/o small patches of pruritic rash at both ankles, and at her left wrist under her watch. These have resolved.  She has lower legs varicosities with no edema. She spoke with our vein clinic nurse in the past re her varicosities and was given a pair of compression hose, but cannot get them on due to the weakness in her wrists. Vanessa [name], vein clinic nurse, spoke with pt subsequently and discussed donning compression hose.  Patient has not had previous carotid artery intervention.  She denies any history of TIA or stroke symptoms.Specifically she denies a history of amaurosis fugax or monocular blindness, unilateral facial drooping, hemiplegia, or receptive or expressive aphasia.  She had some presyncopal episodes, has fallen several times. 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   She resides at [facility].   Pt Diabetic: No Pt smoker: non-smoker  Pt meds include: Statin : Yes ASA: yes Other anticoagulants/antiplatelets: none, since she has a history of GI bleed or has fallen several times, has chronic atrial fib   Past Medical History:  Diagnosis Date  . Abdominal bloating   . Atrial fibrillation ([place])   . Bruises easily   . Carotid artery occlusion    LEFT  . Dizziness   . Fall at home Sept 2013, Dec. 2013  Jun 08, 2012  . GERD (gastroesophageal reflux disease) 02/11/2015  . Headache(784.0)   . Hoarseness   . Hypercholesterolemia   . Hyperglycemia 04/02/2014   Glucose 178 mg percent on 01/22/2014 04/05/14 Hgb A1c 6.6 Diet controlled.    . Hypothyroidism 04/15/2015  . Neuropathy (HCC)    PERIPHERAL  . Pruritus    . Scoliosis   . Varicose veins     Social History Social History  Substance Use Topics  . Smoking status: Never Smoker  . Smokeless tobacco: Never Used  . Alcohol use No    Family History Family History  Problem Relation Age of Onset  . Adopted: Yes  . Diabetes Mother   . Heart attack Mother   . Heart disease Mother     After age 58  . Heart attack Father   . Stroke Father   . Cancer Sister     Breast  . Heart disease Maternal Grandmother   . Cancer Son     adopted son. esophagus, stomach, liver    Surgical History Past Surgical History:  Procedure Laterality Date  . ABDOMINAL HYSTERECTOMY  1954  . CHOLECYSTECTOMY  1997  . SPINE SURGERY  1997   correct scoliosis    Allergies  Allergen Reactions  . Tape Other (See Comments)    Tears skin. Band-Aids    Current Outpatient Prescriptions  Medication Sig Dispense Refill  . ALPRAZolam (XANAX) 0.25 MG tablet Take 0.25 mg by mouth daily as needed for anxiety. Reported on 02/11/2015    . aspirin 81 MG tablet Take 81 mg by mouth daily.    Marland Kitchen atenolol (TENORMIN) 25 MG tablet Take 1 tablet (25 mg total) by mouth 2 (two) times daily. 180 tablet 3  . Biotin 5000 MCG SUBL Place 1 tablet under the tongue daily.    . Calcium-Vitamin D-Vitamin  K 500-500-40 MG-UNT-MCG CHEW Chew 1 tablet by mouth at bedtime. Chew one chew  at bedtime for calcium    . gabapentin (NEURONTIN) 100 MG capsule TAKE 1 CAPSULE AT BEDTIME. 30 capsule 3  . Multiple Vitamins-Minerals (CERTAVITE SENIOR/ANTIOXIDANT PO) Take by mouth. Take one tablet by mouth daily    . simvastatin (ZOCOR) 40 MG tablet TAKE (1/2) TABLET DAILY. 45 tablet 0  . omeprazole (PRILOSEC) 40 MG capsule Take 1 capsule (40 mg total) by mouth daily. (Patient not taking: Reported on 09/10/2015) 90 capsule 3   No current facility-administered medications for this visit.     Review of Systems : See HPI for pertinent positives and negatives.  Physical Examination  Vitals:   09/10/15  1435 09/10/15 1438  BP: (!) 118/54 (!) 122/58  Pulse: 70   Resp: 16   Temp: 97.8 F (36.6 C)   TempSrc: Oral   Weight: 100 lb (45.4 kg)   Height: 5\' 1"  (1.549 m)    Body mass index is 18.89 kg/m.  General: WDWN elderly female in NAD GAIT:normal Eyes: PERRLA Pulmonary: Respirations are non-labored, adequate air movement, CTAB  Cardiac: Irregular rhythm.  VASCULAR EXAM Carotid Bruits Right Left   Negative Negative   Radial pulses are 2+ palpable and equal.      LE Pulses Right Left   POPLITEAL not palpable  not palpable   POSTERIOR TIBIAL 1+ palpable  2+ palpable    DORSALIS PEDIS  ANTERIOR TIBIAL 2+ palpable  2+ palpable     Gastrointestinal: soft, nontender, BS WNL, no r/g, no palpable masses.  Musculoskeletal: Age appropriate generalized muscle atrophy/wasting. M/S 4/5 throughout, Extremities without ischemic changes, varicosities in both lower legs.  Neurologic: A&O X 3; Appropriate Affect, Speech is normal CN 2-12 intact, mild hearing loss, Pain and light touch intact in extremities, Motor exam as listed above.    Assessment: Vanessa [name] is a 82 y.o. female who has a known left internal carotid artery occlusion and minimal right ICA stenosis. She has no history of stroke or TIA.  DATA Today's carotid Duplex suggests  less than 40% right ICA stenosis and known left internal carotid artery occlusion. Both vertebral arteries have antegrade flow. Both subclavian arteries have multiphasic waveforms.  No change since previous study on 08/22/2013 and 09/04/14.    Plan: Follow-up in 1 year with Carotid Duplex scan.   I discussed in depth with the patient the nature of atherosclerosis, and emphasized the importance of maximal medical management including strict  control of blood pressure, blood glucose, and lipid levels, obtaining regular exercise, and continued cessation of smoking.  The patient is aware that without maximal medical management the underlying atherosclerotic disease process will progress, limiting the benefit of any interventions. The patient was given information about stroke prevention and what symptoms should prompt the patient to seek immediate medical care. Thank you for allowing [place] to participate in this patient's care.  Clemon Chambers, RN, MSN, FNP-C [facility] Office: [phone]  Clinic Physician: Scot Dock  09/10/15 3:16 PM

## 2015-09-10 NOTE — Patient Instructions (Signed)
Stroke Prevention Some medical conditions and behaviors are associated with an increased chance of having a stroke. You may prevent a stroke by making healthy choices and managing medical conditions. HOW CAN I REDUCE MY RISK OF HAVING A STROKE?   Stay physically active. Get at least 30 minutes of activity on most or all days.  Do not smoke. It may also be helpful to avoid exposure to secondhand smoke.  Limit alcohol use. Moderate alcohol use is considered to be:  No more than 2 drinks per day for men.  No more than 1 drink per day for nonpregnant women.  Eat healthy foods. This involves:  Eating 5 or more servings of fruits and vegetables a day.  Making dietary changes that address high blood pressure (hypertension), high cholesterol, diabetes, or obesity.  Manage your cholesterol levels.  Making food choices that are high in fiber and low in saturated fat, trans fat, and cholesterol may control cholesterol levels.  Take any prescribed medicines to control cholesterol as directed by your health care provider.  Manage your diabetes.  Controlling your carbohydrate and sugar intake is recommended to manage diabetes.  Take any prescribed medicines to control diabetes as directed by your health care provider.  Control your hypertension.  Making food choices that are low in salt (sodium), saturated fat, trans fat, and cholesterol is recommended to manage hypertension.  Ask your health care provider if you need treatment to lower your blood pressure. Take any prescribed medicines to control hypertension as directed by your health care provider.  If you are 18-39 years of age, have your blood pressure checked every 3-5 years. If you are 40 years of age or older, have your blood pressure checked every year.  Maintain a healthy weight.  Reducing calorie intake and making food choices that are low in sodium, saturated fat, trans fat, and cholesterol are recommended to manage  weight.  Stop drug abuse.  Avoid taking birth control pills.  Talk to your health care provider about the risks of taking birth control pills if you are over 35 years old, smoke, get migraines, or have ever had a blood clot.  Get evaluated for sleep disorders (sleep apnea).  Talk to your health care provider about getting a sleep evaluation if you snore a lot or have excessive sleepiness.  Take medicines only as directed by your health care provider.  For some people, aspirin or blood thinners (anticoagulants) are helpful in reducing the risk of forming abnormal blood clots that can lead to stroke. If you have the irregular heart rhythm of atrial fibrillation, you should be on a blood thinner unless there is a good reason you cannot take them.  Understand all your medicine instructions.  Make sure that other conditions (such as anemia or atherosclerosis) are addressed. SEEK IMMEDIATE MEDICAL CARE IF:   You have sudden weakness or numbness of the face, arm, or leg, especially on one side of the body.  Your face or eyelid droops to one side.  You have sudden confusion.  You have trouble speaking (aphasia) or understanding.  You have sudden trouble seeing in one or both eyes.  You have sudden trouble walking.  You have dizziness.  You have a loss of balance or coordination.  You have a sudden, severe headache with no known cause.  You have new chest pain or an irregular heartbeat. Any of these symptoms may represent a serious problem that is an emergency. Do not wait to see if the symptoms will   go away. Get medical help at once. Call your local emergency services (911 in [place] Do not drive yourself to the hospital.   This information is not intended to replace advice given to you by your health care provider. Make sure you discuss any questions you have with your health care provider.   Document Released: 02/05/2004 Document Revised: 01/18/2014 Document Reviewed:  06/30/2012 Elsevier Interactive Patient Education 2016 [employer]  

## 2015-09-11 ENCOUNTER — Other Ambulatory Visit: Payer: Self-pay | Admitting: Family

## 2015-09-11 DIAGNOSIS — I6529 Occlusion and stenosis of unspecified carotid artery: Secondary | ICD-10-CM

## 2015-09-17 ENCOUNTER — Ambulatory Visit: Payer: Medicare Other | Admitting: Gastroenterology

## 2015-09-17 ENCOUNTER — Encounter: Payer: Self-pay | Admitting: Gastroenterology

## 2015-09-17 ENCOUNTER — Ambulatory Visit (INDEPENDENT_AMBULATORY_CARE_PROVIDER_SITE_OTHER): Payer: Medicare Other | Admitting: Gastroenterology

## 2015-09-17 VITALS — BP 104/68 | HR 72 | Ht 63.0 in | Wt 99.0 lb

## 2015-09-17 DIAGNOSIS — I6523 Occlusion and stenosis of bilateral carotid arteries: Secondary | ICD-10-CM

## 2015-09-17 DIAGNOSIS — R1013 Epigastric pain: Secondary | ICD-10-CM | POA: Diagnosis not present

## 2015-09-17 DIAGNOSIS — R140 Abdominal distension (gaseous): Secondary | ICD-10-CM | POA: Diagnosis not present

## 2015-09-17 NOTE — Progress Notes (Signed)
HPI: This is a   Very pleasant 82 year old woman whom I last saw 4 or 5 months ago.   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 See previous GI note. She was a long-term patient of Dr. Ernest Haber , she had been offered evaluation at the nose and throat declined.  Chief complaint is  Bloating, intermittent abdominal pains  "Very hard to explain".  sometimes she has bloating.  Sometimes hunger type pains before eating, but a bit more 'aggressive.'  carafate helped a bit but not completely.  "very hard to pinpoint"  Her son died and she wonders if that could  ROS: complete GI ROS as described in HPI.  Constitutional:  No unintentional weight loss   Past Medical History:  Diagnosis Date  . Abdominal bloating   . Atrial fibrillation ([place])   . Bruises easily   . Carotid artery occlusion    LEFT  . Dizziness   . Fall at home Sept 2013, Dec. 2013  Jun 08, 2012  . GERD (gastroesophageal reflux disease) 02/11/2015  . Headache(784.0)   . Hoarseness   . Hypercholesterolemia   . Hyperglycemia 04/02/2014   Glucose 178 mg percent on 01/22/2014 04/05/14 Hgb A1c 6.6 Diet controlled.    . Hypothyroidism 04/15/2015  . Neuropathy ([facility])    PERIPHERAL  . Pruritus   . Scoliosis   . Varicose veins     Past Surgical History:  Procedure Laterality Date  . ABDOMINAL HYSTERECTOMY  1954  . CHOLECYSTECTOMY  1997  . SPINE SURGERY  1997   correct scoliosis    Current Outpatient Prescriptions  Medication Sig Dispense Refill  . ALPRAZolam (XANAX) 0.25 MG tablet Take 0.25 mg by mouth daily as needed for anxiety. Reported on 02/11/2015    . aspirin 81 MG tablet Take 81 mg by mouth daily.    Marland Kitchen atenolol (TENORMIN) 25 MG tablet Take 1 tablet (25 mg total) by mouth 2 (two) times daily. 180 tablet 3  . Biotin 5000 MCG SUBL Place 1 tablet under the tongue daily.    . Calcium-Vitamin D-Vitamin K 500-500-40 MG-UNT-MCG CHEW Chew 1  tablet by mouth at bedtime. Chew one chew  at bedtime for calcium    . gabapentin (NEURONTIN) 100 MG capsule TAKE 1 CAPSULE AT BEDTIME. 30 capsule 3  . Multiple Vitamins-Minerals (CERTAVITE SENIOR/ANTIOXIDANT PO) Take by mouth. Take one tablet by mouth daily    . omeprazole (PRILOSEC) 40 MG capsule Take 1 capsule (40 mg total) by mouth daily. (Patient not taking: Reported on 09/10/2015) 90 capsule 3  . simvastatin (ZOCOR) 40 MG tablet TAKE (1/2) TABLET DAILY. 45 tablet 0   No current facility-administered medications for this visit.     Allergies as of 09/17/2015 - Review Complete 09/17/2015  Allergen Reaction Noted  . Tape Other (See Comments) 04/29/2011    Family History  Problem Relation Age of Onset  . Adopted: Yes  . Diabetes Mother   . Heart attack Mother   . Heart disease Mother     After age 49  . Heart attack Father   . Stroke Father   . Cancer Sister     Breast  . Heart disease Maternal Grandmother   . Cancer Son     adopted son. esophagus, stomach, liver    Social History   Social History  . Marital status: Widowed    Spouse name: N/A  . Number of children: 2  . Years of education: 12+  Occupational History  . 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 Smoking status: Never Smoker  . Smokeless tobacco: Never Used  . Alcohol use No  . Drug use: No  . Sexual activity: No   Other Topics Concern  . Not on file   Social History Narrative   Patient lives at [facility] 03/05/2014   Patient is a widowed.    Patient is retired.    Patient has no children but adopted twin sons.    Patient has a college degree.    Never smoked   Alcohol none   Exercise with therapy   Wants with cane   POA       Patient only drinks de-caf drinks.   Patient is right handed.     Physical Exam: BP 104/68   Pulse 72   Ht 5\' 3"  (1.6 m)   Wt 99 lb (44.9 kg)   BMI 17.54 kg/m  Constitutional: generally  well-appearing Psychiatric: alert and oriented x3 Abdomen: soft, nontender, nondistended, no obvious ascites, no peritoneal signs, normal bowel sounds No peripheral edema noted in lower extremities  Assessment and plan: 82 y.o. female with  Bloating, intermittent abdominal pains   she is quite elderly, frail. Recommended continued symptomatic empiric approach here with Gas-X for her bloating and also restarting proton pump inhibitor once daily. She knows to chew her food well eating slowly and take small bites. She will call to report on her response to these changes in 5-6 weeks and sooner if needed.   Owens Loffler, MD [facility] 09/17/2015, 1:40 PM

## 2015-09-17 NOTE — Patient Instructions (Addendum)
Please start taking one (OTC) gas-ex pill with every meal. Chew your food well, eat slowly and take small bites. Restart omeprazole 40mg  pill, one pill once daily. This is best take 20-30 min before breakfast meal daily. Please call Dr. Ardis Hughs' office in 5-6 weeks to report on your progress.

## 2015-09-19 DIAGNOSIS — M961 Postlaminectomy syndrome, not elsewhere classified: Secondary | ICD-10-CM | POA: Diagnosis not present

## 2015-09-19 DIAGNOSIS — M5136 Other intervertebral disc degeneration, lumbar region: Secondary | ICD-10-CM | POA: Diagnosis not present

## 2015-09-19 DIAGNOSIS — M533 Sacrococcygeal disorders, not elsewhere classified: Secondary | ICD-10-CM | POA: Diagnosis not present

## 2015-09-30 ENCOUNTER — Encounter: Payer: Self-pay | Admitting: Internal Medicine

## 2015-09-30 ENCOUNTER — Ambulatory Visit (INDEPENDENT_AMBULATORY_CARE_PROVIDER_SITE_OTHER): Payer: Medicare Other | Admitting: Internal Medicine

## 2015-09-30 ENCOUNTER — Ambulatory Visit: Payer: Medicare Other | Admitting: Internal Medicine

## 2015-09-30 VITALS — BP 120/56 | HR 69 | Ht 61.0 in | Wt 99.0 lb

## 2015-09-30 DIAGNOSIS — I482 Chronic atrial fibrillation, unspecified: Secondary | ICD-10-CM

## 2015-09-30 DIAGNOSIS — I868 Varicose veins of other specified sites: Secondary | ICD-10-CM | POA: Diagnosis not present

## 2015-09-30 DIAGNOSIS — I6523 Occlusion and stenosis of bilateral carotid arteries: Secondary | ICD-10-CM

## 2015-09-30 DIAGNOSIS — I6522 Occlusion and stenosis of left carotid artery: Secondary | ICD-10-CM | POA: Diagnosis not present

## 2015-09-30 DIAGNOSIS — E7800 Pure hypercholesterolemia, unspecified: Secondary | ICD-10-CM

## 2015-09-30 DIAGNOSIS — I8390 Asymptomatic varicose veins of unspecified lower extremity: Secondary | ICD-10-CM

## 2015-09-30 NOTE — Patient Instructions (Signed)
Medication Instructions:  Your physician recommends that you continue on your current medications as directed. Please refer to the Current Medication list given to you today.  Labwork: None Ordered  Testing/Procedures: None Ordered  Follow-Up: Your physician wants you to follow-up in: 6 months with DR HILTY. You will receive a reminder letter in the mail two months in advance. If you don't receive a letter, please call our office to schedule the follow-up appointment.  Any Other Special Instructions Will Be Listed Below (If Applicable).     If you need a refill on your cardiac medications before your next appointment, please call your pharmacy.  

## 2015-09-30 NOTE — Progress Notes (Signed)
OFFICE NOTE  Chief Complaint:  Chest pain  Primary Care Physician: Jeanmarie Hubert, MD  HPI:  Vanessa [name] is a 82 y.o. female who is a new patient of mine and was previously followed by Dr. [name]. She had a long-standing relationship with Dr. Mare Ferrari and since he recently retired has establish care with me. I have reviewed his extensive notes which indicate Vanessa [name] has a history of atrial fibrillation, GI bleeding in the past, 100% occlusion of the left internal carotid artery (followed by vascular surgery, and varicose veins. She also has dyslipidemia and a number of other noncardiac problems. In general, however she seems to do quite well. She is in assisted living at friend's home but is quite independent. Unfortunately, one of her sons currently has a cancer and does not seem to have a good prognosis. She does have another son who lives in [place] who she visited with over the Mother's Day weekend. She denies any chest pain or worsening shortness of breath recently. Her A. fib is chronic and she is rate controlled. She is not on anticoagulation again due to GI bleeding in the past and high risk for future bleeding and therefore only takes aspirin. Given her age, she is not a candidate for the Watchman left atrial appendage occluder device.  06/12/2015  Vanessa [name] was seen today for an urgent visit for chest pain. This morning she called into the office and she was noted to be having some chest discomfort. She is been notably anxious recently due to her son who is in [place] and has been having some health issues. Based on the stress and anxiety she was advised to try to take some Xanax to see if it improved her symptoms. She reports some mild improvement but still has some uneasiness in her chest. I asked her to come in for evaluation as she does have a strong family history of coronary disease without any coronary testing in the recent past. EKG today was reassuring  demonstrated no ischemic changes, atrial fibrillation with rate control at 60. She currently reports her pain as a 2-3 out of 10, nonradiating, present at rest and unchanged.  09/30/2015  Vanessa [name] was seen today for follow-up. Unfortunately the interim her son died this summer after a short illness. There is a significant amount of stress related to this which I think was causing her chest pain. That seems to resolve somewhat. Blood pressure appears normal today 120/56. EKG is unchanged and shows persistent A. fib. Again she is not anticoagulated due to history of falls and GI bleeding in the past.  PMHx:  Past Medical History:  Diagnosis Date  . Abdominal bloating   . Atrial fibrillation ([place])   . Bruises easily   . Carotid artery occlusion    LEFT  . Dizziness   . Fall at home Sept 2013, Dec. 2013  Jun 08, 2012  . GERD (gastroesophageal reflux disease) 02/11/2015  . Headache(784.0)   . Hoarseness   . Hypercholesterolemia   . Hyperglycemia 04/02/2014   Glucose 178 mg percent on 01/22/2014 04/05/14 Hgb A1c 6.6 Diet controlled.    . Hypothyroidism 04/15/2015  . Neuropathy (HCC)    PERIPHERAL  . Pruritus   . Scoliosis   . Varicose veins     Past Surgical History:  Procedure Laterality Date  . ABDOMINAL HYSTERECTOMY  1954  . CHOLECYSTECTOMY  1997  . SPINE SURGERY  1997   correct scoliosis    FAMHx:  Family History  Problem Relation Age of Onset  . Adopted: Yes  . Diabetes Mother   . Heart attack Mother   . Heart disease Mother     After age 37  . Heart attack Father   . Stroke Father   . Cancer Sister     Breast  . Heart disease Maternal Grandmother   . Cancer Son     adopted son. esophagus, stomach, liver    SOCHx:   reports that she has never smoked. She has never used smokeless tobacco. She reports that she does not drink alcohol or use drugs.  ALLERGIES:  Allergies  Allergen Reactions  . Tape Other (See Comments)    Tears skin. Band-Aids     ROS: Pertinent items noted in HPI and remainder of comprehensive ROS otherwise negative.  HOME MEDS: Current Outpatient Prescriptions  Medication Sig Dispense Refill  . ALPRAZolam (XANAX) 0.25 MG tablet Take 0.25 mg by mouth daily as needed for anxiety. Reported on 02/11/2015    . aspirin 81 MG tablet Take 81 mg by mouth daily.    Marland Kitchen atenolol (TENORMIN) 25 MG tablet Take 1 tablet (25 mg total) by mouth 2 (two) times daily. 180 tablet 3  . Biotin 5000 MCG SUBL Place 1 tablet under the tongue daily.    . Calcium-Vitamin D-Vitamin K 500-500-40 MG-UNT-MCG CHEW Chew 1 tablet by mouth at bedtime. Chew one chew  at bedtime for calcium    . gabapentin (NEURONTIN) 100 MG capsule TAKE 1 CAPSULE AT BEDTIME. 30 capsule 3  . Multiple Vitamins-Minerals (CERTAVITE SENIOR/ANTIOXIDANT PO) Take by mouth. Take one tablet by mouth daily    . omeprazole (PRILOSEC) 40 MG capsule Take 1 capsule (40 mg total) by mouth daily. 90 capsule 3  . simvastatin (ZOCOR) 40 MG tablet TAKE (1/2) TABLET DAILY. 45 tablet 0   No current facility-administered medications for this visit.     LABS/IMAGING: No results found for this or any previous visit (from the past 48 hour(s)). No results found.  WEIGHTS: Wt Readings from Last 3 Encounters:  09/30/15 99 lb (44.9 kg)  09/17/15 99 lb (44.9 kg)  09/10/15 100 lb (45.4 kg)    VITALS: BP (!) 120/56   Pulse 69   Ht 5\' 1"  (1.549 m)   Wt 99 lb (44.9 kg)   BMI 18.71 kg/m   EXAM: General appearance: alert, appears stated age and no distress Neck: no carotid bruit and no JVD Lungs: clear to auscultation bilaterally Heart: irregularly irregular rhythm Abdomen: soft, non-tender; bowel sounds normal; no masses,  no organomegaly Extremities: extremities normal, atraumatic, no cyanosis or edema and varicose veins noted Pulses: 2+ and symmetric Skin: Skin color, texture, turgor normal. No rashes or lesions Neurologic: Grossly normal Psych: Pleasant  EKG: Atrial  fibrillation at 69  ASSESSMENT: 1. Atypical chest pain 2. Anxiety 3. Chronic atrial fibrillation 4. CHADSVASC score of 6-not anticoagulated due to GI bleeding in the past and falls 5. Dyslipidemia 6. Occluded left carotid artery  PLAN: 1.   Overall Vanessa [name] is doing better, but still grieving over the death of her son. She's not had any further recurrent chest pain. I do not think she needs any ischemia testing at this time. Again we'll continue aspirin for mild reduction in risk for A. fib as she is not a candidate for anticoagulation. She has been having some hip problems and is considering hip injections. It would be okay to stop aspirin for those if necessary.  Follow-up 6 months.  Pixie Casino, MD, [facility] Attending Cardiologist [facility] [place] 09/30/2015, 2:49 PM

## 2015-10-07 ENCOUNTER — Encounter: Payer: Self-pay | Admitting: Nurse Practitioner

## 2015-10-07 ENCOUNTER — Non-Acute Institutional Stay: Payer: Medicare Other | Admitting: Nurse Practitioner

## 2015-10-07 DIAGNOSIS — M150 Primary generalized (osteo)arthritis: Secondary | ICD-10-CM

## 2015-10-07 DIAGNOSIS — K219 Gastro-esophageal reflux disease without esophagitis: Secondary | ICD-10-CM

## 2015-10-07 DIAGNOSIS — M8949 Other hypertrophic osteoarthropathy, multiple sites: Secondary | ICD-10-CM

## 2015-10-07 DIAGNOSIS — I480 Paroxysmal atrial fibrillation: Secondary | ICD-10-CM | POA: Diagnosis not present

## 2015-10-07 DIAGNOSIS — F411 Generalized anxiety disorder: Secondary | ICD-10-CM | POA: Diagnosis not present

## 2015-10-07 DIAGNOSIS — M159 Polyosteoarthritis, unspecified: Secondary | ICD-10-CM

## 2015-10-07 DIAGNOSIS — E039 Hypothyroidism, unspecified: Secondary | ICD-10-CM | POA: Diagnosis not present

## 2015-10-07 DIAGNOSIS — K5900 Constipation, unspecified: Secondary | ICD-10-CM | POA: Diagnosis not present

## 2015-10-07 DIAGNOSIS — I739 Peripheral vascular disease, unspecified: Secondary | ICD-10-CM | POA: Diagnosis not present

## 2015-10-07 NOTE — Assessment & Plan Note (Signed)
Heart rate is in control, continue Atenolol 25mg bid. 

## 2015-10-07 NOTE — Assessment & Plan Note (Signed)
Occasional heartburn symptoms, continue Omeprazole 20mg  daily.

## 2015-10-07 NOTE — Assessment & Plan Note (Signed)
Chronic, no significant change, no open wounds

## 2015-10-07 NOTE — Assessment & Plan Note (Signed)
Daily prn Xanax 0.25mg  is adequate for her to rest at night.

## 2015-10-07 NOTE — Assessment & Plan Note (Signed)
TSH 3.38 07/16/14

## 2015-10-07 NOTE — Progress Notes (Signed)
Location:  [facility] Room Number: 01 Place of Service:  ALF [phone]) Provider:  Moriah Loughry, Manxie  NP  Jeanmarie Hubert, MD  Patient Care Team: Estill Dooms, MD as PCP - General (Internal Medicine) Angelia Mould, MD as Attending Physician (Vascular Surgery) Ronald Lobo, MD (Gastroenterology) Harle Battiest, MD as Attending Physician (Obstetrics and Gynecology) Amy Martinique, MD as Consulting Physician (Dermatology) Thornell Sartorius, MD (Otolaryngology) [name], MD (Orthopedic Surgery) [name], MD (Ophthalmology) [name], MD as Consulting Physician (Urology) [name], MD as Consulting Physician (Cardiology) [name], PA-C as Physician Assistant (Otolaryngology) [name], MD as Consulting Physician (Otolaryngology) [name], MD as Consulting Physician (Dermatology) [name], MD as Consulting Physician (Otolaryngology) [name], MD as Consulting Physician (Physical Medicine and Rehabilitation) [name], MD as Attending Physician (Gastroenterology)  Extended Emergency Contact Information Primary Emergency Contact: [place] Phone: [phone] Relation: Other Secondary Emergency Contact: [name] of [place] Phone: [phone] Mobile Phone: [phone] Relation: Son  Code Status:  Full Code Goals of care: Advanced Directive information Advanced Directives 10/07/2015  Does patient have an advance directive? Yes  Type of Advance Directive [place]  Does patient want to make changes to advanced directive? No - Patient declined  Copy of advanced directive(s) in chart? Yes  Would patient like information on creating an advanced directive? -     Chief Complaint  Patient presents with  . Medical Management of Chronic Issues    HPI:  Pt is a 82 y.o. female seen today for medical management of chronic diseases.      Peripheral  neuropathy, taking Gabapentin 100mg  nightly, GERD has been stable except occasional heartburn symptoms while on Omeprazole, not constipation while on Colace and Metamucil, sleeps and rest at night with help of Alprazolam. Hx of Afib, heart rate is in control, taking Atenolol 25mg  bid. Chronic lower back pain-f/u Ortho.  Past Medical History:  Diagnosis Date  . Abdominal bloating   . Atrial fibrillation ([place])   . Bruises easily   . Carotid artery occlusion    LEFT  . Dizziness   . Fall at home Sept 2013, Dec. 2013  Jun 08, 2012  . GERD (gastroesophageal reflux disease) 02/11/2015  . Headache(784.0)   . Hoarseness   . Hypercholesterolemia   . Hyperglycemia 04/02/2014   Glucose 178 mg percent on 01/22/2014 04/05/14 Hgb A1c 6.6 Diet controlled.    . Hypothyroidism 04/15/2015  . Neuropathy (HCC)    PERIPHERAL  . Pruritus   . Scoliosis   . Varicose veins    Past Surgical History:  Procedure Laterality Date  . ABDOMINAL HYSTERECTOMY  1954  . CHOLECYSTECTOMY  1997  . SPINE SURGERY  1997   correct scoliosis    Allergies  Allergen Reactions  . Tape Other (See Comments)    Tears skin. Band-Aids      Medication List       Accurate as of 10/07/15  1:00 PM. Always use your most recent med list.          ALPRAZolam 0.25 MG tablet Commonly known as:  XANAX Take 0.25 mg by mouth daily as needed for anxiety. Reported on 02/11/2015   aspirin 81 MG tablet Take 81 mg by mouth daily.   atenolol 25 MG tablet Commonly known as:  TENORMIN Take 1 tablet (25 mg total) by mouth 2 (two) times daily.   Biotin 5000 MCG Subl Place 1 tablet  under the tongue daily.   Calcium-Vitamin D-Vitamin K [id number] MG-UNT-MCG Chew Chew 1 tablet by mouth at bedtime. Chew one chew  at bedtime for calcium   CERTAVITE SENIOR/ANTIOXIDANT PO Take by mouth. Take one tablet by mouth daily   gabapentin 100 MG capsule Commonly known as:  NEURONTIN TAKE 1 CAPSULE AT BEDTIME.   omeprazole 40 MG  capsule Commonly known as:  PRILOSEC Take 1 capsule (40 mg total) by mouth daily.   simvastatin 40 MG tablet Commonly known as:  ZOCOR TAKE (1/2) TABLET DAILY.       Review of Systems  Constitutional: Negative for chills, diaphoresis and fever.  HENT: Positive for hearing loss. Negative for congestion, ear discharge, ear pain, sore throat and tinnitus.        Chronic sinus congestion. Dentures. Xerostomia.  Eyes: Negative for pain and redness.  Respiratory: Positive for shortness of breath (On exertion). Negative for cough and wheezing.   Cardiovascular: Positive for palpitations. Negative for chest pain and leg swelling.       Hx AF  Gastrointestinal: Negative for abdominal pain, constipation, diarrhea and nausea.       HX GI bleed In Sept 2015. Difficulty swallowing solids. Avoids beef. Barium swallow 08/09/14 showed tight cricopharyngeus muscle, GERD, and mild esophageal dysmotility.  Endocrine: Negative for polydipsia.  Genitourinary: Negative for dysuria, flank pain, frequency, hematuria and urgency.  Musculoskeletal: Positive for back pain. Negative for myalgias and neck pain.       Hx scoliosis and surgery of the lower back to partially correct it. Medial right knee pain  Skin: Negative for rash.       Chronic pruritus.  Has multiple lesions of the back including irritated seborrheic keratoses, atrophic plaques, multiple angiomas. Sebaceous cyst of the dorsal aspect of the left forearm. BLE varicose veins  Neurological: Positive for dizziness. Negative for tremors, seizures, weakness and headaches.       History of neuropathy with some numbness in the feet.  Hematological: Does not bruise/bleed easily.       Anemia resolved in May 2016  Psychiatric/Behavioral: Negative for hallucinations and suicidal ideas. The patient is nervous/anxious.     Immunization History  Administered Date(s) Administered  . Influenza Split 10/20/2011, 11/03/2012  . Influenza,inj,Quad PF,36+  Mos 09/29/2012, 11/09/2013  . Influenza-Unspecified 10/17/2014  . PPD Test 08/21/2013, 03/04/2014  . Pneumococcal Conjugate-13 08/21/2013  . Tdap 05/20/2008   Pertinent  Health Maintenance Due  Topic Date Due  . DEXA SCAN  01/31/1987  . PNA vac Low Risk Adult (2 of 2 - PPSV23) 08/22/2014  . INFLUENZA VACCINE  08/12/2015   Fall Risk  09/02/2015 04/29/2015 04/01/2015 02/11/2015 11/05/2014  Falls in the past year? No No No No No  Number falls in past yr: - - - - -  Injury with Fall? - - - - -  Risk Factor Category  - - - - -  Risk for fall due to : - - - - -  Risk for fall due to (comments): - - - - -   Functional Status Survey:    Vitals:   10/07/15 1237  BP: 118/76  Pulse: 82  Resp: 20  Temp: 97.8 F (36.6 C)  Weight: 98 lb 12.8 oz (44.8 kg)  Height: 5\' 1"  (1.549 m)   Body mass index is 18.67 kg/m. Physical Exam  Constitutional: She is oriented to person, place, and time. She appears well-developed and well-nourished. No distress.  Thin, frail.  HENT:  Right Ear: External ear  normal.  Left Ear: External ear normal.  Nose: Nose normal.  Mouth/Throat: Oropharynx is clear and moist. No oropharyngeal exudate.  Eyes: Conjunctivae and EOM are normal. Pupils are equal, round, and reactive to light. No scleral icterus.  Neck: No JVD present. No tracheal deviation present. No thyromegaly present.  Cardiovascular: Normal rate, normal heart sounds and intact distal pulses.  Exam reveals no gallop and no friction rub.   No murmur heard. Atrial fibrillation. Controlled rate. Varicose veins in both legs.  Pulmonary/Chest: Effort normal. No respiratory distress. She has no wheezes. She has no rales. She exhibits no tenderness.  Hoarse  Abdominal: She exhibits no distension and no mass. There is no tenderness.  Musculoskeletal: Normal range of motion. She exhibits no edema or tenderness.  Scar in lumbar area secondary prior surgery. Some difficulty lying down and getting up from the  exam table.  Lymphadenopathy:    She has no cervical adenopathy.  Neurological: She is alert and oriented to person, place, and time. No cranial nerve deficit. Coordination normal.  09/02/2015 MMSE 29/30. Passed clock drawing..  Skin: No rash noted. She is not diaphoretic. No erythema. No pallor.  Multiple seborrheic keratoses the temple and on the back. Varicose veins.   Psychiatric: She has a normal mood and affect. Her behavior is normal. Judgment and thought content normal.    Labs reviewed:  Recent Labs  04/03/15 07/07/15  NA 139 139  K 4.6 4.7  BUN 14 14  CREATININE 0.9 0.9    Recent Labs  04/03/15  AST 18  ALT 9  ALKPHOS 85    Recent Labs  04/03/15  WBC 7.1  HGB 12.0  HCT 36  PLT 269   Lab Results  Component Value Date   TSH 3.38 07/16/2014   Lab Results  Component Value Date   HGBA1C 6.4 07/07/2015   Lab Results  Component Value Date   CHOL 148 07/07/2015   HDL 70 07/07/2015   LDLCALC 58 07/07/2015   TRIG 100 07/07/2015   CHOLHDL 3 07/09/2013    Significant Diagnostic Results in last 30 days:  No results found.  Assessment/Plan Atrial fibrillation Heart rate is in control, continue Atenolol 25mg  bid.   PVD (peripheral vascular disease) Chronic, no significant change, no open wounds  GERD (gastroesophageal reflux disease) Occasional heartburn symptoms, continue Omeprazole 20mg  daily.    Constipation Stable, continue Colace, Metamucil. Observe.    Hypothyroidism TSH 3.38 07/16/14  Osteoarthritis Resolved medial right knee pain, f/u ortho  Anxiety state Daily prn Xanax 0.25mg  is adequate for her to rest at night.     Family/ staff Communication: continue AL for care assistance.   Labs/tests ordered:  none

## 2015-10-07 NOTE — Assessment & Plan Note (Signed)
Resolved medial right knee pain, f/u ortho

## 2015-10-07 NOTE — Assessment & Plan Note (Signed)
Stable, continue Colace, Metamucil. Observe.  

## 2015-10-27 ENCOUNTER — Telehealth: Payer: Self-pay | Admitting: Gastroenterology

## 2015-10-27 NOTE — Telephone Encounter (Signed)
Pt calling back about previous msg

## 2015-10-28 MED ORDER — OMEPRAZOLE 40 MG PO CPDR: 40.0000 mg | DELAYED_RELEASE_CAPSULE | Freq: Every day | ORAL | 3 refills | Status: DC

## 2015-10-28 NOTE — Telephone Encounter (Signed)
Patient is calling back. Best # [phone]

## 2015-10-28 NOTE — Telephone Encounter (Signed)
Prescription sent for 90 day supply of omeprazole and ROV scheduled for tomorrow.  Pt aware

## 2015-10-28 NOTE — Telephone Encounter (Signed)
Pt is calling back

## 2015-10-29 ENCOUNTER — Ambulatory Visit (INDEPENDENT_AMBULATORY_CARE_PROVIDER_SITE_OTHER): Payer: Medicare Other | Admitting: Gastroenterology

## 2015-10-29 ENCOUNTER — Encounter: Payer: Self-pay | Admitting: Gastroenterology

## 2015-10-29 ENCOUNTER — Encounter (INDEPENDENT_AMBULATORY_CARE_PROVIDER_SITE_OTHER): Payer: Self-pay

## 2015-10-29 VITALS — BP 98/60 | HR 72 | Ht 61.0 in | Wt 98.0 lb

## 2015-10-29 DIAGNOSIS — R140 Abdominal distension (gaseous): Secondary | ICD-10-CM | POA: Diagnosis not present

## 2015-10-29 DIAGNOSIS — I6523 Occlusion and stenosis of bilateral carotid arteries: Secondary | ICD-10-CM

## 2015-10-29 DIAGNOSIS — R1013 Epigastric pain: Secondary | ICD-10-CM

## 2015-10-29 NOTE — Patient Instructions (Addendum)
You were offered EGD but declined, so instead proceed with the following. Restart gas-ex; take one pill with each meal. Continue omeprazole once daily. Call in in 3-4 weeks to report on your response.

## 2015-10-29 NOTE — Progress Notes (Signed)
HPI: This is a  pleasant 82 year old woman  Chief complaint is borborygmi, dyspepsia, bloating  I last saw her a month ago.  She made the changes to her medicines.  The gas ex might have helped a bit but she says it is OTC and so not a big deal.  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 See previous GI note. She was a long-term patient of Dr. Cristina Gong , she had been offered evaluation at the nose and throat declined.    She says these problems have been going  I saw her one month ago with bloating, intermittent abd pains.  Given her frailty, age I recommended she restart PPI and also start gas-ex with every meal.    She is still having intermittent bloating, gurgling, pressure in her abdomen.  She says she cannot pinpoint any specific foods.  She is just 'aware of her stomach all the time'.  She feels bloated very often.  She hurts before she eats, she is bloated after she eats.  She has pressure in her abdomen.  Moves her bowels regularly without much problem.  She takes omeprazole before breakfast meal.  She is taking gas-ex  ROS: complete GI ROS as described in HPI.  Constitutional:  No unintentional weight loss   Past Medical History:  Diagnosis Date  . Abdominal bloating   . Atrial fibrillation ([place])   . Bruises easily   . Carotid artery occlusion    LEFT  . Dizziness   . Fall at home Sept 2013, Dec. 2013  Jun 08, 2012  . GERD (gastroesophageal reflux disease) 02/11/2015  . Headache(784.0)   . Hoarseness   . Hypercholesterolemia   . Hyperglycemia 04/02/2014   Glucose 178 mg percent on 01/22/2014 04/05/14 Hgb A1c 6.6 Diet controlled.    . Hypothyroidism 04/15/2015  . Neuropathy ([facility])    PERIPHERAL  . Pruritus   . Scoliosis   . Varicose veins     Past Surgical History:  Procedure Laterality Date  . ABDOMINAL HYSTERECTOMY  1954  . CHOLECYSTECTOMY  1997  . SPINE SURGERY  1997   correct scoliosis    Current Outpatient Prescriptions  Medication Sig Dispense Refill  . ALPRAZolam (XANAX) 0.25 MG tablet Take 0.25 mg by mouth daily as needed for anxiety. Reported on 02/11/2015    . aspirin 81 MG tablet Take 81 mg by mouth daily.    Marland Kitchen atenolol (TENORMIN) 25 MG tablet Take 1 tablet (25 mg total) by mouth 2 (two) times daily. 180 tablet 3  . Biotin 5000 MCG SUBL Place 1 tablet under the tongue daily.    Marland Kitchen gabapentin (NEURONTIN) 100 MG capsule TAKE 1 CAPSULE AT BEDTIME. 30 capsule 3  . Multiple Vitamins-Calcium (VIACTIV MULTI-VITAMIN PO) Take 1 tablet by mouth daily.    . Multiple Vitamins-Minerals (CERTAVITE SENIOR/ANTIOXIDANT PO) Take by mouth. Take one tablet by mouth daily    . omeprazole (PRILOSEC) 40 MG capsule Take 1 capsule (40 mg total) by mouth daily. 90 capsule 3  . simvastatin (ZOCOR) 40 MG tablet TAKE (1/2) TABLET DAILY. 45 tablet 0   No current facility-administered medications for this visit.     Allergies as of 10/29/2015  . (No Active Allergies)    Family History  Problem Relation Age of Onset  . Adopted: Yes  . Diabetes Mother   . Heart attack Mother   . Heart disease Mother     After age 11  .  Heart attack Father   . Stroke Father   . Cancer Sister     Breast  . Heart disease Maternal Grandmother   . Cancer Son     adopted son. esophagus, stomach, liver    Social History   Social History  . Marital status: Widowed    Spouse name: N/A  . Number of children: 2  . Years of education: 12+   Occupational History  . 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 Smoking status: Never Smoker  . Smokeless tobacco: Never Used  . Alcohol use No  . Drug use: No  . Sexual activity: No   Other Topics Concern  . Not on file   Social History Narrative   Patient lives at [facility] 03/05/2014   Patient is a widowed.    Patient is retired.    Patient has no children but adopted  twin sons.    Patient has a college degree.    Never smoked   Alcohol none   Exercise with therapy   Wants with cane   POA       Patient only drinks de-caf drinks.   Patient is right handed.     Physical Exam: BP 98/60   Pulse 72   Ht 5\' 1"  (1.549 m)   Wt 98 lb (44.5 kg)   BMI 18.52 kg/m  Constitutional: generally well-appearing Psychiatric: alert and oriented x3 Abdomen: soft, nontender, nondistended, no obvious ascites, no peritoneal signs, normal bowel sounds No peripheral edema noted in lower extremities  Assessment and plan: 82 y.o. female with borborygmi, dyspepsia, bloating  I offered upper endoscopy to evaluate the above symptoms that she was not interested. Instead she prefers to restart her Gas-X, she will take 1 pill with every meal. She did notice some improvement with this when she was taking it past few weeks. She is going to continue on her omeprazole. She will call to report on her response, symptoms in 3-4 weeks and sooner if needed.   Owens Loffler, MD [facility] 10/29/2015, 9:11 AM

## 2015-11-04 ENCOUNTER — Non-Acute Institutional Stay: Payer: Medicare Other | Admitting: Nurse Practitioner

## 2015-11-04 ENCOUNTER — Encounter: Payer: Self-pay | Admitting: Nurse Practitioner

## 2015-11-04 DIAGNOSIS — K5900 Constipation, unspecified: Secondary | ICD-10-CM

## 2015-11-04 DIAGNOSIS — G629 Polyneuropathy, unspecified: Secondary | ICD-10-CM

## 2015-11-04 DIAGNOSIS — R1314 Dysphagia, pharyngoesophageal phase: Secondary | ICD-10-CM

## 2015-11-04 DIAGNOSIS — I480 Paroxysmal atrial fibrillation: Secondary | ICD-10-CM

## 2015-11-04 DIAGNOSIS — F411 Generalized anxiety disorder: Secondary | ICD-10-CM

## 2015-11-04 DIAGNOSIS — E039 Hypothyroidism, unspecified: Secondary | ICD-10-CM

## 2015-11-04 NOTE — Assessment & Plan Note (Signed)
Stable presently, continue Omeprazole 20mg  daily, Gas X, f/u GI

## 2015-11-04 NOTE — Assessment & Plan Note (Signed)
Heart rate is in control, continue Atenolol 25mg bid. 

## 2015-11-04 NOTE — Assessment & Plan Note (Signed)
Daily prn Xanax 0.25mg  is adequate for her to rest at night.

## 2015-11-04 NOTE — Assessment & Plan Note (Signed)
Stable, continue Colace, Metamucil. Observe.  

## 2015-11-04 NOTE — Progress Notes (Signed)
Location:  [facility] Room Number: 01 Place of Service:  ALF [phone]) Provider:  Mast, Manxie  NP  Jeanmarie Hubert, MD  Patient Care Team: Estill Dooms, MD as PCP - General (Internal Medicine) Angelia Mould, MD as Attending Physician (Vascular Surgery) Ronald Lobo, MD (Gastroenterology) [name], MD as Attending Physician (Obstetrics and Gynecology) Amy Martinique, MD as Consulting Physician (Dermatology) Thornell Sartorius, MD (Otolaryngology) [name], MD (Orthopedic Surgery) [name], MD (Ophthalmology) [name], MD as Consulting Physician (Urology) [name], MD as Consulting Physician (Cardiology) [name], PA-C as Physician Assistant (Otolaryngology) [name], MD as Consulting Physician (Otolaryngology) [name], MD as Consulting Physician (Dermatology) [name], MD as Consulting Physician (Otolaryngology) [name], MD as Consulting Physician (Physical Medicine and Rehabilitation) [name], MD as Attending Physician (Gastroenterology) Man Otho Darner, NP as Nurse Practitioner (Internal Medicine)  Extended Emergency Contact Information Primary Emergency Contact: Keys of [place] Phone: [phone] Relation: Other Secondary Emergency Contact: [name] of [place] Phone: [phone] Mobile Phone: [phone] Relation: Son  Code Status: Full Code Goals of care: Advanced Directive information Advanced Directives 11/04/2015  Does patient have an advance directive? -  Type of Advance Directive [place]  Does patient want to make changes to advanced directive? No - Patient declined  Copy of advanced directive(s) in chart? Yes  Would patient like information on creating an advanced directive? -     Chief Complaint  Patient presents with  . Medical Management of Chronic Issues    HPI:  Pt is a 82 y.o. female seen today for medical  management of chronic diseases.    Hx of Peripheral neuropathy, taking Gabapentin 100mg  nightly, GERD has been stable except occasional heartburn symptoms while on Omeprazole, gas X, not constipation while on Colace and Metamucil, sleeps and rest at night with help of Alprazolam. Hx of Afib, heart rate is in control, taking Atenolol 25mg  bid. Chronic lower back pain-f/u Ortho.   Past Medical History:  Diagnosis Date  . Abdominal bloating   . Atrial fibrillation ([place])   . Bruises easily   . Carotid artery occlusion    LEFT  . Dizziness   . DOE (dyspnea on exertion) 04/02/2014  . Fall at home Sept 2013, Dec. 2013  Jun 08, 2012  . GERD (gastroesophageal reflux disease) 02/11/2015  . Headache(784.0)   . Hoarseness   . Hypercholesterolemia   . Hyperglycemia 04/02/2014   Glucose 178 mg percent on 01/22/2014 04/05/14 Hgb A1c 6.6 Diet controlled.    . Hypothyroidism 04/15/2015  . Neuropathy ([facility])    PERIPHERAL  . Pruritus   . Scoliosis   . Varicose veins    Past Surgical History:  Procedure Laterality Date  . ABDOMINAL HYSTERECTOMY  1954  . CHOLECYSTECTOMY  1997  . SPINE SURGERY  1997   correct scoliosis    No Known Allergies    Medication List       Accurate as of 11/04/15  1:04 PM. Always use your most recent med list.          ALPRAZolam 0.25 MG tablet Commonly known as:  XANAX Take 0.25 mg by mouth daily as needed for anxiety. Reported on 02/11/2015   amoxicillin 250 MG/5ML suspension Commonly known as:  AMOXIL Taking for dental appointments.   aspirin 81 MG tablet Take 81 mg by mouth daily.   atenolol 25 MG tablet Commonly known as:  TENORMIN Take 1 tablet (  25 mg total) by mouth 2 (two) times daily.   CERTAVITE SENIOR/ANTIOXIDANT PO Take by mouth. Take one tablet by mouth daily   docusate sodium 100 MG capsule Commonly known as:  COLACE Take 100 mg by mouth daily as needed for mild constipation.   gabapentin 100 MG capsule Commonly known as:  NEURONTIN TAKE  1 CAPSULE AT BEDTIME.   psyllium 58.6 % powder Commonly known as:  METAMUCIL Take 1 packet by mouth daily. Mix 1 tablespoon of powder in 8 oz of water or juice.   simvastatin 20 MG tablet Commonly known as:  ZOCOR Take 20 mg by mouth daily.   VIACTIV MULTI-VITAMIN PO Take 1 tablet by mouth daily.       Review of Systems  Constitutional: Negative for chills, diaphoresis and fever.  HENT: Positive for hearing loss. Negative for congestion, ear discharge, ear pain, sore throat and tinnitus.        Chronic sinus congestion. Dentures. Xerostomia.  Eyes: Negative for pain and redness.  Respiratory: Positive for shortness of breath (On exertion). Negative for cough and wheezing.   Cardiovascular: Positive for palpitations. Negative for chest pain and leg swelling.       Hx AF  Gastrointestinal: Negative for abdominal pain, constipation, diarrhea and nausea.       HX GI bleed In Sept 2015. Difficulty swallowing solids. Avoids beef. Barium swallow 08/09/14 showed tight cricopharyngeus muscle, GERD, and mild esophageal dysmotility.  Endocrine: Negative for polydipsia.  Genitourinary: Negative for dysuria, flank pain, frequency, hematuria and urgency.  Musculoskeletal: Positive for back pain. Negative for myalgias and neck pain.       Hx scoliosis and surgery of the lower back to partially correct it. Medial right knee pain  Skin: Negative for rash.       Chronic pruritus.  Has multiple lesions of the back including irritated seborrheic keratoses, atrophic plaques, multiple angiomas. Sebaceous cyst of the dorsal aspect of the left forearm. BLE varicose veins  Neurological: Positive for dizziness. Negative for tremors, seizures, weakness and headaches.       History of neuropathy with some numbness in the feet.  Hematological: Does not bruise/bleed easily.       Anemia resolved in May 2016  Psychiatric/Behavioral: Negative for hallucinations and suicidal ideas. The patient is  nervous/anxious.     Immunization History  Administered Date(s) Administered  . Influenza Split 10/20/2011, 11/03/2012  . Influenza,inj,Quad PF,36+ Mos 09/29/2012, 11/09/2013  . Influenza-Unspecified 10/17/2014, 10/23/2015  . PPD Test 08/21/2013, 03/04/2014  . Pneumococcal Conjugate-13 08/21/2013  . Tdap 05/20/2008   Pertinent  Health Maintenance Due  Topic Date Due  . DEXA SCAN  01/31/1987  . PNA vac Low Risk Adult (2 of 2 - PPSV23) 08/22/2014  . INFLUENZA VACCINE  Completed   Fall Risk  09/02/2015 04/29/2015 04/01/2015 02/11/2015 11/05/2014  Falls in the past year? No No No No No  Number falls in past yr: - - - - -  Injury with Fall? - - - - -  Risk Factor Category  - - - - -  Risk for fall due to : - - - - -  Risk for fall due to (comments): - - - - -   Functional Status Survey:    Vitals:   11/04/15 1003  BP: 130/66  Pulse: 75  Resp: 20  Temp: 98.4 F (36.9 C)  Weight: 99 lb 3.2 oz (45 kg)  Height: 5\' 1"  (1.549 m)   Body mass index is 18.74 kg/m. Physical  Exam  Constitutional: She is oriented to person, place, and time. She appears well-developed and well-nourished. No distress.  Thin, frail.  HENT:  Right Ear: External ear normal.  Left Ear: External ear normal.  Nose: Nose normal.  Mouth/Throat: Oropharynx is clear and moist. No oropharyngeal exudate.  Eyes: Conjunctivae and EOM are normal. Pupils are equal, round, and reactive to light. No scleral icterus.  Neck: No JVD present. No tracheal deviation present. No thyromegaly present.  Cardiovascular: Normal rate, normal heart sounds and intact distal pulses.  Exam reveals no gallop and no friction rub.   No murmur heard. Atrial fibrillation. Controlled rate. Varicose veins in both legs.  Pulmonary/Chest: Effort normal. No respiratory distress. She has no wheezes. She has no rales. She exhibits no tenderness.  Hoarse  Abdominal: She exhibits no distension and no mass. There is no tenderness.    Musculoskeletal: Normal range of motion. She exhibits no edema or tenderness.  Scar in lumbar area secondary prior surgery. Some difficulty lying down and getting up from the exam table.  Lymphadenopathy:    She has no cervical adenopathy.  Neurological: She is alert and oriented to person, place, and time. No cranial nerve deficit. Coordination normal.  09/02/2015 MMSE 29/30. Passed clock drawing..  Skin: No rash noted. She is not diaphoretic. No erythema. No pallor.  Multiple seborrheic keratoses the temple and on the back. Varicose veins.   Psychiatric: She has a normal mood and affect. Her behavior is normal. Judgment and thought content normal.    Labs reviewed:  Recent Labs  04/03/15 07/07/15  NA 139 139  K 4.6 4.7  BUN 14 14  CREATININE 0.9 0.9    Recent Labs  04/03/15  AST 18  ALT 9  ALKPHOS 85    Recent Labs  04/03/15  WBC 7.1  HGB 12.0  HCT 36  PLT 269   Lab Results  Component Value Date   TSH 3.38 07/16/2014   Lab Results  Component Value Date   HGBA1C 6.4 07/07/2015   Lab Results  Component Value Date   CHOL 148 07/07/2015   HDL 70 07/07/2015   LDLCALC 58 07/07/2015   TRIG 100 07/07/2015   CHOLHDL 3 07/09/2013    Significant Diagnostic Results in last 30 days:  No results found.  Assessment/Plan Atrial fibrillation Heart rate is in control, continue Atenolol 25mg  bid.    Dysphagia, pharyngoesophageal phase Stable presently, continue Omeprazole 20mg  daily, Gas X, f/u GI   Constipation Stable, continue Colace, Metamucil. Observe.     Hypothyroidism TSH 3.38 07/16/14  Neuropathy Chronic, continue Gabapentin 100mg  nightly, update CBC, CMP, TSH   Anxiety state Daily prn Xanax 0.25mg  is adequate for her to rest at night.       Family/ staff Communication: continue AL for care assistance.   Labs/tests ordered:  CBC, CMP, TSH

## 2015-11-04 NOTE — Assessment & Plan Note (Signed)
TSH 3.38 07/16/14

## 2015-11-04 NOTE — Assessment & Plan Note (Signed)
Chronic, continue Gabapentin 100mg  nightly, update CBC, CMP, TSH

## 2015-11-06 DIAGNOSIS — E039 Hypothyroidism, unspecified: Secondary | ICD-10-CM | POA: Diagnosis not present

## 2015-11-06 DIAGNOSIS — I4891 Unspecified atrial fibrillation: Secondary | ICD-10-CM | POA: Diagnosis not present

## 2015-11-06 LAB — BASIC METABOLIC PANEL
BUN: 16 mg/dL (ref 4–21)
Creatinine: 0.8 mg/dL (ref <=1.1)
Glucose: 86 mg/dL
Potassium: 4.5 mmol/L (ref 3.4–5.3)
Sodium: 138 mmol/L (ref 137–147)

## 2015-11-06 LAB — CBC AND DIFFERENTIAL
HCT: 37 % (ref 36–46)
Hemoglobin: 12.1 g/dL (ref 12.0–16.0)
Platelets: 270 10*3/uL (ref 150–399)
WBC: 6.9 10^3/mL

## 2015-11-06 LAB — HEPATIC FUNCTION PANEL
ALT: 10 U/L (ref 7–35)
AST: 24 U/L (ref 13–35)
Alkaline Phosphatase: 99 U/L (ref 25–125)
Bilirubin, Total: 0.6 mg/dL

## 2015-11-06 LAB — TSH: TSH: 3.08 u[IU]/mL (ref <=5.90)

## 2015-11-10 ENCOUNTER — Other Ambulatory Visit: Payer: Self-pay | Admitting: Internal Medicine

## 2015-11-11 ENCOUNTER — Encounter: Payer: Self-pay | Admitting: Nurse Practitioner

## 2015-11-11 ENCOUNTER — Other Ambulatory Visit: Payer: Self-pay | Admitting: *Deleted

## 2015-11-18 DIAGNOSIS — M533 Sacrococcygeal disorders, not elsewhere classified: Secondary | ICD-10-CM | POA: Diagnosis not present

## 2015-11-18 DIAGNOSIS — M961 Postlaminectomy syndrome, not elsewhere classified: Secondary | ICD-10-CM | POA: Diagnosis not present

## 2015-11-24 ENCOUNTER — Telehealth: Payer: Self-pay | Admitting: Gastroenterology

## 2015-11-25 ENCOUNTER — Other Ambulatory Visit: Payer: Self-pay | Admitting: Internal Medicine

## 2015-11-25 NOTE — Telephone Encounter (Signed)
Patient calling back regarding this.  °

## 2015-11-25 NOTE — Telephone Encounter (Signed)
Upset she had not heard back from [facility] yet. I asked her if maybe I could help her. Says she needs to see Dr Ardis Hughs but did not want to wait until next available. Advised her she could see one of our APP sooner. Tells me she only wants an APP that will consult with Dr Ardis Hughs before treating her. I assured her all of our APP's consults with their supervising providers. She has agreed to come next week Monday for an appointment. She no longer needs Patti to call back.

## 2015-11-26 NOTE — Telephone Encounter (Signed)
Noted thanks.  Of note; I was in clinic all day with Dr Ardis Hughs the day she left the message and was not able to make phone calls.

## 2015-12-01 ENCOUNTER — Ambulatory Visit (INDEPENDENT_AMBULATORY_CARE_PROVIDER_SITE_OTHER): Payer: Medicare Other | Admitting: Physician Assistant

## 2015-12-01 ENCOUNTER — Encounter: Payer: Self-pay | Admitting: Physician Assistant

## 2015-12-01 VITALS — BP 110/70 | HR 88 | Ht 61.0 in | Wt 100.0 lb

## 2015-12-01 DIAGNOSIS — R140 Abdominal distension (gaseous): Secondary | ICD-10-CM

## 2015-12-01 DIAGNOSIS — I6523 Occlusion and stenosis of bilateral carotid arteries: Secondary | ICD-10-CM

## 2015-12-01 DIAGNOSIS — R1013 Epigastric pain: Secondary | ICD-10-CM | POA: Diagnosis not present

## 2015-12-01 DIAGNOSIS — R198 Other specified symptoms and signs involving the digestive system and abdomen: Secondary | ICD-10-CM

## 2015-12-01 NOTE — Progress Notes (Signed)
Chief Complaint: Borborygmi, Dyspepsia, Bloating  HPI:  Vanessa [name] is a 82 year old Caucasian female who returns to clinic today with a continued complaint of borborygmi, dyspepsia and bloating. She has been following with Dr. Ardis Hughs regarding these complaints for the last 6 months, she has been seen on 3 separate occasions. 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 She was a long-term patient of Dr. Cristina Gong previously. When patient was seen in September she was restarted on Omeprazole 20 mg daily and told to use Gas-X. At time of last visit on 10/29/15 she continued to complain of intermittent bloating and gurgling. At that visit she was offered an upper endoscopy to evaluate her symptoms and she declined. She opted to take her Gas-X 1 pill with every meal and continue her Omeprazole.   Today, the patient presents to clinic failry frustrated that she continues to have all of her symptoms.  She tells me this has been ongoing for the past year and nobody has been able to tell her an etiology. Currently, she is using Omeprazole 20 mg daily and Gas-X after eating meals. She tells me sometimes this helps and sometimes it doesn't. She currently lives in the assisted living facility and eats well for 3 meals of the day. She tells me that her abdomen will "hurt" before eating and afterward she feels severely bloated. She also complains of a "loud gurgling noise in my left lower side", which occurs throughout the day and is sometimes embarrassing during a meal. Patient tells me that when her abdomen becomes generally uncomfortable the only way to really relieve this is try to sleep and it will go away. She does not know if her Omeprazole really helps her symptoms but is scared to stop this as it was started so long ago. The patient continues to describe regular daily bowel movements with the help of prune juice.   Patient's  social history is positive for having twin sons, one of them apparently passed away from a stomach cancer a year ago, which was diagnosed after he started with stomach pain. This makes the patient somewhat more anxious about her symptoms.   The patient denies fever, chills, blood in her stool, weight loss, fatigue, anorexia, nausea, vomiting, heartburn, reflux or symptoms that awaken her at night.  Past Medical History:  Diagnosis Date  . Abdominal bloating   . Atrial fibrillation ([place])   . Bruises easily   . Carotid artery occlusion    LEFT  . Dizziness   . DOE (dyspnea on exertion) 04/02/2014  . Fall at home Sept 2013, Dec. 2013  Jun 08, 2012  . GERD (gastroesophageal reflux disease) 02/11/2015  . Headache(784.0)   . Hoarseness   . Hypercholesterolemia   . Hyperglycemia 04/02/2014   Glucose 178 mg percent on 01/22/2014 04/05/14 Hgb A1c 6.6 Diet controlled.    . Hypothyroidism 04/15/2015  . Neuropathy (HCC)    PERIPHERAL  . Pruritus   . Scoliosis   . Varicose veins     Past Surgical History:  Procedure Laterality Date  . ABDOMINAL HYSTERECTOMY  1954  . CHOLECYSTECTOMY  1997  . SPINE SURGERY  1997   correct scoliosis    Current Outpatient Prescriptions  Medication Sig Dispense Refill  . ALPRAZolam (XANAX) 0.25 MG tablet Take 0.25 mg by mouth daily as needed for anxiety. Reported on 02/11/2015    . aspirin 81 MG tablet Take 81 mg by mouth daily.    Marland Kitchen  atenolol (TENORMIN) 25 MG tablet Take 1 tablet (25 mg total) by mouth 2 (two) times daily. 180 tablet 3  . gabapentin (NEURONTIN) 100 MG capsule TAKE 1 CAPSULE AT BEDTIME. 30 capsule 3  . Multiple Vitamins-Calcium (VIACTIV MULTI-VITAMIN PO) Take 1 tablet by mouth daily.    . Multiple Vitamins-Minerals (CERTAVITE SENIOR/ANTIOXIDANT PO) Take by mouth. Take one tablet by mouth daily    . simvastatin (ZOCOR) 40 MG tablet TAKE (1/2) TABLET DAILY. 45 tablet 1  . triamcinolone cream (KENALOG) 0.1 % as needed.     No current  facility-administered medications for this visit.     Allergies as of 12/01/2015  . (No Known Allergies)    Family History  Problem Relation Age of Onset  . Adopted: Yes  . Diabetes Mother   . Heart attack Mother   . Heart disease Mother     After age 14  . Heart attack Father   . Stroke Father   . Cancer Sister     Breast  . Heart disease Maternal Grandmother   . Cancer Son     adopted son. esophagus, stomach, liver    Social History   Social History  . Marital status: Widowed    Spouse name: N/A  . Number of children: 2  . Years of education: 12+   Occupational History  . 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 Smoking status: Never Smoker  . Smokeless tobacco: Never Used  . Alcohol use No  . Drug use: No  . Sexual activity: No   Other Topics Concern  . Not on file   Social History Narrative   Patient lives at [facility] 03/05/2014   Patient is a widowed.    Patient is retired.    Patient has no children but adopted twin sons.    Patient has a college degree.    Never smoked   Alcohol none   Exercise with therapy   Wants with cane   POA       Patient only drinks de-caf drinks.   Patient is right handed.    Review of Systems:     Constitutional: No weight loss Cardiovascular: No chest pain Respiratory: No SOB  Gastrointestinal: See HPI and otherwise negative   Physical Exam:  Vital signs: BP 110/70   Pulse 88 Comment: irreg  Ht 5\' 1"  (1.549 m)   Wt 100 lb (45.4 kg)   BMI 18.89 kg/m   Constitutional:   Pleasant Frail elderly Caucasian female appears to be in NAD, Well developed, Well nourished, alert and cooperative Head:  Normocephalic and atraumatic. Eyes:   PEERL, EOMI. No icterus. Conjunctiva pink. Ears:  Normal auditory acuity. Neck:  Supple Throat: Oral cavity and pharynx without inflammation, swelling or lesion.  Respiratory: Respirations even and unlabored. Lungs  clear to auscultation bilaterally.   No wheezes, crackles, or rhonchi.  Cardiovascular: Normal S1, S2. No MRG. Regular rate and rhythm. No peripheral edema, cyanosis or pallor.  Gastrointestinal:  Soft, nondistended, nontender. No rebound or guarding. Increased bowel sounds all 4 quadrants. No appreciable masses or hepatomegaly. Msk:  Symmetrical without gross deformities. Without edema, no deformity or joint abnormality.  Psychiatric:  Demonstrates good judgement and reason without abnormal affect or behaviors.  MOST RECENT LABS: CBC    Component Value Date/Time   WBC 6.9 11/06/2015   WBC 8.8 01/22/2014 1632   RBC 3.96 01/22/2014 1632   HGB 12.1 11/06/2015  HCT 37 11/06/2015   PLT 270 11/06/2015   MCV 87.9 01/22/2014 1632   MCV 101 (A) 07/27/2011 1149   MCH 27.0 01/22/2014 1632   MCHC 30.7 01/22/2014 1632   RDW 15.7 (H) 01/22/2014 1632   LYMPHSABS 1.1 09/13/2013 0600   MONOABS 1.0 09/13/2013 0600   EOSABS 0.4 09/13/2013 0600   BASOSABS 0.0 09/13/2013 0600    CMP     Component Value Date/Time   NA 138 11/06/2015   K 4.5 11/06/2015   CL 103 01/22/2014 1632   CO2 27 01/22/2014 1632   GLUCOSE 178 (H) 01/22/2014 1632   BUN 16 11/06/2015   CREATININE 0.8 11/06/2015   CREATININE 0.76 01/22/2014 1632   CREATININE 0.95 06/23/2011 1056   CALCIUM 8.8 01/22/2014 1632   PROT 6.0 09/10/2013 0449   ALBUMIN 2.4 (L) 09/10/2013 0449   AST 24 11/06/2015   ALT 10 11/06/2015   ALKPHOS 99 11/06/2015   BILITOT 0.3 09/10/2013 0449   GFRNONAA 72 (L) 01/22/2014 1632   GFRAA 83 (L) 01/22/2014 1632    Assessment: 1. Borborygmi: Patient complains of a loud gurgling noise in her left lower quadrant which she can feel air moving around, happens intermittently throughout the day, no apparent change with Gas-X 3 times a day after meals and Omeprazole daily; consider relation to diet versus IBS versus functional symptoms 2. Dyspepsia: Patient does continue with some epigastric discomfort  regardless of Omeprazole 20 mg daily, likely this is functional in nature 3. Bloating: Continues per patient, makes her uncomfortable, consider relation to diet vs IBS  Plan: 1. Discussed an EGD at great length today during patient's visit. Patient is frustrated as she has no etiology for her symptoms but is aware that she is at high risk for anesthesia at her advanced age. This is a difficult decision for her to make. At this time she would like more time to think about this, but is aware that this option is available if she wishes to proceed. 2. Recommend the patient increase her Omeprazole to 20 mg twice a day, 30-60 minutes before eating. 3. Recommend the patient take her Gas-X 3 times a day before eating. 4. Started the patient on FD Guard which she will take 2 tablets twice a day 30 minutes before or after eating. Patient was given samples today in clinic as well as a coupon. Did discuss that she should stay on this for the next 4-6 weeks as it can take that long to see changes. 5. Patient will call our clinic if she decides to proceed with EGD in the meantime. 6. Patient to follow with Dr. Ardis Hughs in 2 months.  Vanessa Newer, PA-C [facility] 12/01/2015, 1:54 PM  Cc: Estill Dooms, MD

## 2015-12-01 NOTE — Patient Instructions (Signed)
Please use the FD GARD 2 capsules twice a day 30 min before meals. You can open the capsules and put in applesauce.  Increase the Omeprazole 20 mg from once a day to twice a day 30-60 min before meals.   Take your GAS X three times a day before meals.  If you are age 80 or older, your body mass index should be between 23-30. Your Body mass index is 18.89 kg/m. If this is out of the aforementioned range listed, please consider follow up with your Primary Care Provider.  If you are age 3 or younger, your body mass index should be between 19-25. Your Body mass index is 18.89 kg/m. If this is out of the aformentioned range listed, please consider follow up with your Primary Care Provider.   Thank you for choosing [facility]  J. Lemmon, P.A

## 2015-12-02 NOTE — Progress Notes (Signed)
I agree with the above note, plan 

## 2015-12-08 DIAGNOSIS — L820 Inflamed seborrheic keratosis: Secondary | ICD-10-CM | POA: Diagnosis not present

## 2015-12-08 DIAGNOSIS — L299 Pruritus, unspecified: Secondary | ICD-10-CM | POA: Diagnosis not present

## 2015-12-08 DIAGNOSIS — L814 Other melanin hyperpigmentation: Secondary | ICD-10-CM | POA: Diagnosis not present

## 2015-12-08 DIAGNOSIS — D225 Melanocytic nevi of trunk: Secondary | ICD-10-CM | POA: Diagnosis not present

## 2015-12-08 DIAGNOSIS — L821 Other seborrheic keratosis: Secondary | ICD-10-CM | POA: Diagnosis not present

## 2015-12-08 DIAGNOSIS — D1801 Hemangioma of skin and subcutaneous tissue: Secondary | ICD-10-CM | POA: Diagnosis not present

## 2015-12-08 DIAGNOSIS — L03019 Cellulitis of unspecified finger: Secondary | ICD-10-CM | POA: Diagnosis not present

## 2015-12-09 ENCOUNTER — Non-Acute Institutional Stay: Payer: Medicare Other | Admitting: Nurse Practitioner

## 2015-12-09 ENCOUNTER — Encounter: Payer: Self-pay | Admitting: Nurse Practitioner

## 2015-12-09 DIAGNOSIS — G629 Polyneuropathy, unspecified: Secondary | ICD-10-CM

## 2015-12-09 DIAGNOSIS — K219 Gastro-esophageal reflux disease without esophagitis: Secondary | ICD-10-CM

## 2015-12-09 DIAGNOSIS — E039 Hypothyroidism, unspecified: Secondary | ICD-10-CM | POA: Diagnosis not present

## 2015-12-09 DIAGNOSIS — K5900 Constipation, unspecified: Secondary | ICD-10-CM

## 2015-12-09 DIAGNOSIS — F422 Mixed obsessional thoughts and acts: Secondary | ICD-10-CM | POA: Diagnosis not present

## 2015-12-09 DIAGNOSIS — I480 Paroxysmal atrial fibrillation: Secondary | ICD-10-CM

## 2015-12-09 DIAGNOSIS — F411 Generalized anxiety disorder: Secondary | ICD-10-CM

## 2015-12-09 DIAGNOSIS — M961 Postlaminectomy syndrome, not elsewhere classified: Secondary | ICD-10-CM | POA: Diagnosis not present

## 2015-12-09 DIAGNOSIS — R634 Abnormal weight loss: Secondary | ICD-10-CM

## 2015-12-09 DIAGNOSIS — M5440 Lumbago with sciatica, unspecified side: Secondary | ICD-10-CM

## 2015-12-09 DIAGNOSIS — R350 Frequency of micturition: Secondary | ICD-10-CM | POA: Insufficient documentation

## 2015-12-09 NOTE — Assessment & Plan Note (Signed)
Stable, continue Colace, Metamucil. Observe.  

## 2015-12-09 NOTE — Assessment & Plan Note (Signed)
Chronic, continue Gabapentin 100mg  nightly,

## 2015-12-09 NOTE — Assessment & Plan Note (Signed)
Hesitancy and frequency, UA C/S

## 2015-12-09 NOTE — Assessment & Plan Note (Addendum)
Occasional heartburn symptoms, continue Omeprazole 20mg  daily, gas x ac meals, GI 12/01/15

## 2015-12-09 NOTE — Assessment & Plan Note (Signed)
11/06/15 wbc 6.9, Hgb 12.1, plt 270, Na 138, K 4.5, Bun 16, creat 0.83, TSH 3.08 Wt 100Ibs, weight q 2 weeks

## 2015-12-09 NOTE — Assessment & Plan Note (Signed)
Daily prn Xanax 0.25mg  is adequate for her to rest at night.

## 2015-12-09 NOTE — Assessment & Plan Note (Signed)
TSH 3.08 11/06/15

## 2015-12-09 NOTE — Progress Notes (Signed)
Location:  [place] Room Number: 1 Place of Service:  ALF [id number]) Provider:  Omaria Plunk, Manxie NP  Jeanmarie Hubert, MD  Patient Care Team: Estill Dooms, MD as PCP - General (Internal Medicine) Angelia Mould, MD as Attending Physician (Vascular Surgery) Ronald Lobo, MD (Gastroenterology) [name], MD as Attending Physician (Obstetrics and Gynecology) Amy Martinique, MD as Consulting Physician (Dermatology) Thornell Sartorius, MD (Otolaryngology) [name], MD (Orthopedic Surgery) [name], MD (Ophthalmology) [name], MD as Consulting Physician (Urology) [name], MD as Consulting Physician (Cardiology) [name], PA-C as Physician Assistant (Otolaryngology) [name], MD as Consulting Physician (Otolaryngology) [name], MD as Consulting Physician (Dermatology) [name], MD as Consulting Physician (Otolaryngology) [name], MD as Consulting Physician (Physical Medicine and Rehabilitation) [name], MD as Attending Physician (Gastroenterology) Clotine Heiner Otho Darner, NP as Nurse Practitioner (Internal Medicine)  Extended Emergency Contact Information Primary Emergency Contact: [place] of [place] Phone: [phone] Relation: Other Secondary Emergency Contact: [name] of [place] Phone: [phone] Mobile Phone: [phone] Relation: Son  Code Status:  Full Code Goals of care: Advanced Directive information Advanced Directives 12/09/2015  Does Patient Have a Medical Advance Directive? Yes  Type of Advance Directive [place]  Does patient want to make changes to medical advance directive? No - Patient declined  Copy of [place] in Chart? Yes  Would patient like information on creating a medical advance directive? -     Chief Complaint  Patient presents with  . Medical Management of Chronic Issues    HPI:  Pt is a 82 y.o.  female seen today for medical management of chronic diseases.    Hx of Peripheral neuropathy, taking Gabapentin 100mg  nightly, GERD has been stable except occasional heartburn symptoms while on Omeprazole, gas X, not constipation while on Colace and Metamucil, sleeps and rest at night with help of Alprazolam. Hx of Afib, heart rate is in control, taking Atenolol 25mg  bid. Chronic lower back pain-f/u Ortho.    Past Medical History:  Diagnosis Date  . Abdominal bloating   . Atrial fibrillation ([place])   . Bruises easily   . Carotid artery occlusion    LEFT  . Dizziness   . DOE (dyspnea on exertion) 04/02/2014  . Fall at home Sept 2013, Dec. 2013  Jun 08, 2012  . GERD (gastroesophageal reflux disease) 02/11/2015  . Headache(784.0)   . Hoarseness   . Hypercholesterolemia   . Hyperglycemia 04/02/2014   Glucose 178 mg percent on 01/22/2014 04/05/14 Hgb A1c 6.6 Diet controlled.    . Hypothyroidism 04/15/2015  . Neuropathy (HCC)    PERIPHERAL  . Pruritus   . Scoliosis   . Varicose veins    Past Surgical History:  Procedure Laterality Date  . ABDOMINAL HYSTERECTOMY  1954  . CHOLECYSTECTOMY  1997  . SPINE SURGERY  1997   correct scoliosis    No Known Allergies    Medication List       Accurate as of 12/09/15  5:11 PM. Always use your most recent med list.          ALPRAZolam 0.25 MG tablet Commonly known as:  XANAX Take 0.25 mg by mouth daily as needed for anxiety. Reported on 02/11/2015   aspirin 81 MG tablet Take 81 mg by mouth daily.   atenolol 25 MG tablet Commonly known as:  TENORMIN Take 1 tablet (25 mg total) by mouth 2 (two) times daily.  CERTAVITE SENIOR/ANTIOXIDANT PO Take by mouth. Take one tablet by mouth daily   gabapentin 100 MG capsule Commonly known as:  NEURONTIN TAKE 1 CAPSULE AT BEDTIME.   simvastatin 40 MG tablet Commonly known as:  ZOCOR TAKE (1/2) TABLET DAILY.   triamcinolone cream 0.1 % Commonly known as:  KENALOG as needed.   VIACTIV  MULTI-VITAMIN PO Take 1 tablet by mouth daily.       Review of Systems  Constitutional: Negative for chills, diaphoresis and fever.  HENT: Positive for hearing loss. Negative for congestion, ear discharge, ear pain, sore throat and tinnitus.        Chronic sinus congestion. Dentures. Xerostomia.  Eyes: Negative for pain and redness.  Respiratory: Positive for shortness of breath (On exertion). Negative for cough and wheezing.   Cardiovascular: Positive for palpitations. Negative for chest pain and leg swelling.       Hx AF  Gastrointestinal: Negative for abdominal pain, constipation, diarrhea and nausea.       HX GI bleed In Sept 2015. Difficulty swallowing solids. Avoids beef. Barium swallow 08/09/14 showed tight cricopharyngeus muscle, GERD, and mild esophageal dysmotility.  Endocrine: Negative for polydipsia.  Genitourinary: Positive for frequency. Negative for dysuria, flank pain, hematuria and urgency.  Musculoskeletal: Positive for back pain. Negative for myalgias and neck pain.       Hx scoliosis and surgery of the lower back to partially correct it. Medial right knee pain  Skin: Negative for rash.       Chronic pruritus.  Has multiple lesions of the back including irritated seborrheic keratoses, atrophic plaques, multiple angiomas. Sebaceous cyst of the dorsal aspect of the left forearm. BLE varicose veins  Neurological: Positive for dizziness. Negative for tremors, seizures, weakness and headaches.       History of neuropathy with some numbness in the feet.  Hematological: Does not bruise/bleed easily.       Anemia resolved in May 2016  Psychiatric/Behavioral: Negative for hallucinations and suicidal ideas. The patient is nervous/anxious.     Immunization History  Administered Date(s) Administered  . Influenza Split 10/20/2011, 11/03/2012  . Influenza,inj,Quad PF,36+ Mos 09/29/2012, 11/09/2013  . Influenza-Unspecified 10/17/2014, 10/23/2015  . PPD Test 08/21/2013,  03/04/2014  . Pneumococcal Conjugate-13 08/21/2013  . Tdap 05/20/2008   Pertinent  Health Maintenance Due  Topic Date Due  . DEXA SCAN  01/31/1987  . PNA vac Low Risk Adult (2 of 2 - PPSV23) 08/22/2014  . INFLUENZA VACCINE  Completed   Fall Risk  09/02/2015 04/29/2015 04/01/2015 02/11/2015 11/05/2014  Falls in the past year? No No No No No  Number falls in past yr: - - - - -  Injury with Fall? - - - - -  Risk Factor Category  - - - - -  Risk for fall due to : - - - - -  Risk for fall due to (comments): - - - - -   Functional Status Survey:    Vitals:   12/09/15 1152  BP: 112/70  Pulse: 82  Resp: 16  Temp: 97.2 F (36.2 C)  SpO2: 98%  Weight: 100 lb 9.6 oz (45.6 kg)  Height: 5\' 1"  (1.549 m)   Body mass index is 19.01 kg/m. Physical Exam  Constitutional: She is oriented to person, place, and time. She appears well-developed and well-nourished. No distress.  Thin, frail.  HENT:  Right Ear: External ear normal.  Left Ear: External ear normal.  Nose: Nose normal.  Mouth/Throat: Oropharynx is clear and moist. No  oropharyngeal exudate.  Eyes: Conjunctivae and EOM are normal. Pupils are equal, round, and reactive to light. No scleral icterus.  Neck: No JVD present. No tracheal deviation present. No thyromegaly present.  Cardiovascular: Normal rate, normal heart sounds and intact distal pulses.  Exam reveals no gallop and no friction rub.   No murmur heard. Atrial fibrillation. Controlled rate. Varicose veins in both legs.  Pulmonary/Chest: Effort normal. No respiratory distress. She has no wheezes. She has no rales. She exhibits no tenderness.  Hoarse  Abdominal: She exhibits no distension and no mass. There is no tenderness.  Musculoskeletal: Normal range of motion. She exhibits no edema or tenderness.  Scar in lumbar area secondary prior surgery. Some difficulty lying down and getting up from the exam table.  Lymphadenopathy:    She has no cervical adenopathy.    Neurological: She is alert and oriented to person, place, and time. No cranial nerve deficit. Coordination normal.  09/02/2015 MMSE 29/30. Passed clock drawing..  Skin: No rash noted. She is not diaphoretic. No erythema. No pallor.  Multiple seborrheic keratoses the temple and on the back. Varicose veins.   Psychiatric: She has a normal mood and affect. Her behavior is normal. Judgment and thought content normal.    Labs reviewed:  Recent Labs  04/03/15 07/07/15 11/06/15  NA 139 139 138  K 4.6 4.7 4.5  BUN 14 14 16   CREATININE 0.9 0.9 0.8    Recent Labs  04/03/15 11/06/15  AST 18 24  ALT 9 10  ALKPHOS 85 99    Recent Labs  04/03/15 11/06/15  WBC 7.1 6.9  HGB 12.0 12.1  HCT 36 37  PLT 269 270   Lab Results  Component Value Date   TSH 3.08 11/06/2015   Lab Results  Component Value Date   HGBA1C 6.4 07/07/2015   Lab Results  Component Value Date   CHOL 148 07/07/2015   HDL 70 07/07/2015   LDLCALC 58 07/07/2015   TRIG 100 07/07/2015   CHOLHDL 3 07/09/2013    Significant Diagnostic Results in last 30 days:  No results found.  Assessment/Plan Atrial fibrillation (HCC) Heart rate is in control, continue Atenolol 25mg  bid.    GERD (gastroesophageal reflux disease) Occasional heartburn symptoms, continue Omeprazole 20mg  daily, gas x ac meals, GI 12/01/15   Constipation Stable, continue Colace, Metamucil. Observe.     Hypothyroidism TSH 3.08 11/06/15  Neuropathy Chronic, continue Gabapentin 100mg  nightly,  Low back pain Chronic, not disabling, f/u Ortho, inj 12/09/15  Anxiety state Daily prn Xanax 0.25mg  is adequate for her to rest at night.     OCD (obsessive compulsive disorder) Functional in AL  Loss of weight 11/06/15 wbc 6.9, Hgb 12.1, plt 270, Na 138, K 4.5, Bun 16, creat 0.83, TSH 3.08 Wt 100Ibs, weight q 2 weeks  Urinary frequency Hesitancy and frequency, UA C/S      Family/ staff Communication: AL  Labs/tests ordered:   UA C/S

## 2015-12-09 NOTE — Assessment & Plan Note (Signed)
Heart rate is in control, continue Atenolol 25mg bid. 

## 2015-12-09 NOTE — Assessment & Plan Note (Addendum)
Chronic, not disabling, f/u Ortho, inj 12/09/15

## 2015-12-09 NOTE — Assessment & Plan Note (Signed)
Functional in AL

## 2015-12-10 DIAGNOSIS — N390 Urinary tract infection, site not specified: Secondary | ICD-10-CM | POA: Diagnosis not present

## 2015-12-16 ENCOUNTER — Telehealth: Payer: Self-pay | Admitting: Physician Assistant

## 2015-12-17 DIAGNOSIS — H40053 Ocular hypertension, bilateral: Secondary | ICD-10-CM | POA: Diagnosis not present

## 2015-12-17 DIAGNOSIS — D3131 Benign neoplasm of right choroid: Secondary | ICD-10-CM | POA: Diagnosis not present

## 2015-12-17 DIAGNOSIS — Z961 Presence of intraocular lens: Secondary | ICD-10-CM | POA: Diagnosis not present

## 2015-12-17 DIAGNOSIS — H10413 Chronic giant papillary conjunctivitis, bilateral: Secondary | ICD-10-CM | POA: Diagnosis not present

## 2015-12-17 DIAGNOSIS — H04123 Dry eye syndrome of bilateral lacrimal glands: Secondary | ICD-10-CM | POA: Diagnosis not present

## 2015-12-17 NOTE — Telephone Encounter (Signed)
Patient notified that samples and coupons are out front to pick up

## 2015-12-17 NOTE — Telephone Encounter (Signed)
Pt tells me that she does feel some better, but has called around town to find [place] and it is far too expensive to take long term, over 1$ per pill. I advised her that she should continue taking this medicine 2 pills twice a day for now. When she sees Dr. Ardis Hughs in follow-up he can discuss titrating down this medicine to possibly one pill twice a day. We also discussed that if she finds relief of her symptoms long-term and needs to stay on this, we can possibly talk with the Rep about getting further coupons/samples. She thanked me for calling and was happy to be feeling some better. Ellouise Newer, PA-C

## 2015-12-23 ENCOUNTER — Other Ambulatory Visit: Payer: Self-pay | Admitting: Gastroenterology

## 2015-12-24 ENCOUNTER — Other Ambulatory Visit: Payer: Self-pay

## 2015-12-24 MED ORDER — OMEPRAZOLE 40 MG PO CPDR: 40.0000 mg | DELAYED_RELEASE_CAPSULE | Freq: Two times a day (BID) | ORAL | 3 refills | Status: DC

## 2015-12-25 ENCOUNTER — Telehealth: Payer: Self-pay | Admitting: Physician Assistant

## 2015-12-26 NOTE — Telephone Encounter (Signed)
Spoke with pt. She just wanted to confirm to stay on Omperazole 20mg  bid and FD Guard 2 tabs BID. Confirmed this. She can talk about decreasing this with Dr. Ardis Hughs at time of follow up in January.  JLL

## 2016-01-06 ENCOUNTER — Encounter: Payer: Self-pay | Admitting: Nurse Practitioner

## 2016-01-06 NOTE — Progress Notes (Signed)
Location:  [facility] Room Number: 1 Place of Service:  [id number] [phone]) Provider:  Mast, Manxie  NP  Jeanmarie Hubert, MD  Patient Care Team: Estill Dooms, MD as PCP - General (Internal Medicine) Angelia Mould, MD as Attending Physician (Vascular Surgery) Ronald Lobo, MD (Gastroenterology) [name], MD as Attending Physician (Obstetrics and Gynecology) Amy Martinique, MD as Consulting Physician (Dermatology) Thornell Sartorius, MD (Otolaryngology) [name], MD (Orthopedic Surgery) [name], MD (Ophthalmology) [name], MD as Consulting Physician (Urology) [name], MD as Consulting Physician (Cardiology) [name], PA-C as Physician Assistant (Otolaryngology) [name], MD as Consulting Physician (Otolaryngology) [name], MD as Consulting Physician (Dermatology) [name], MD as Consulting Physician (Otolaryngology) [name], MD as Consulting Physician (Physical Medicine and Rehabilitation) [name], MD as Attending Physician (Gastroenterology) Man Otho Darner, NP as Nurse Practitioner (Internal Medicine)  Extended Emergency Contact Information Primary Emergency Contact: [place] Phone: [phone] Relation: Other Secondary Emergency Contact: [name] of [place] Phone: [phone] Mobile Phone: [phone] Relation: Son  Code Status:  Full Code Goals of care: Advanced Directive information Advanced Directives 01/06/2016  Does Patient Have a Medical Advance Directive? Yes  Type of Advance Directive [place]  Does patient want to make changes to medical advance directive? No - Patient declined  Copy of [place] in Chart? Yes  Would patient like information on creating a medical advance directive? -     Chief Complaint  Patient presents with  . Medical Management of Chronic Issues    HPI:  Pt is a 82 y.o.  female seen today for medical management of chronic diseases.     Past Medical History:  Diagnosis Date  . Abdominal bloating   . Atrial fibrillation ([place])   . Bruises easily   . Carotid artery occlusion    LEFT  . Dizziness   . DOE (dyspnea on exertion) 04/02/2014  . Fall at home Sept 2013, Dec. 2013  Jun 08, 2012  . GERD (gastroesophageal reflux disease) 02/11/2015  . Headache(784.0)   . Hoarseness   . Hypercholesterolemia   . Hyperglycemia 04/02/2014   Glucose 178 mg percent on 01/22/2014 04/05/14 Hgb A1c 6.6 Diet controlled.    . Hypothyroidism 04/15/2015  . Neuropathy (HCC)    PERIPHERAL  . Pruritus   . Scoliosis   . Varicose veins    Past Surgical History:  Procedure Laterality Date  . ABDOMINAL HYSTERECTOMY  1954  . CHOLECYSTECTOMY  1997  . SPINE SURGERY  1997   correct scoliosis    No Known Allergies  Allergies as of 01/06/2016   No Known Allergies     Medication List       Accurate as of 01/06/16 10:14 AM. Always use your most recent med list.          ALPRAZolam 0.25 MG tablet Commonly known as:  XANAX Take 0.25 mg by mouth daily as needed for anxiety. Reported on 02/11/2015   aspirin 81 MG tablet Take 81 mg by mouth daily.   atenolol 25 MG tablet Commonly known as:  TENORMIN Take 1 tablet (25 mg total) by mouth 2 (two) times daily.   CERTAVITE SENIOR/ANTIOXIDANT PO Take by mouth. Take one tablet by mouth daily   docusate sodium 100 MG capsule Commonly known as:  COLACE Take 100 mg by mouth daily. Take 1 capsule daily for constipation   gabapentin 100 MG capsule Commonly  known as:  NEURONTIN TAKE 1 CAPSULE AT BEDTIME.   psyllium 58.6 % powder Commonly known as:  METAMUCIL Take 1 packet by mouth daily. Mix with 8 oz of water or juice.   simvastatin 40 MG tablet Commonly known as:  ZOCOR TAKE (1/2) TABLET DAILY.   VIACTIV MULTI-VITAMIN PO Take 1 tablet by mouth daily.       Review of Systems  Immunization History  Administered  Date(s) Administered  . Influenza Split 10/20/2011, 11/03/2012  . Influenza,inj,Quad PF,36+ Mos 09/29/2012, 11/09/2013  . Influenza-Unspecified 10/17/2014, 10/23/2015  . PPD Test 08/21/2013, 03/04/2014  . Pneumococcal Conjugate-13 08/21/2013  . Tdap 05/20/2008   Pertinent  Health Maintenance Due  Topic Date Due  . DEXA SCAN  01/31/1987  . PNA vac Low Risk Adult (2 of 2 - PPSV23) 08/22/2014  . INFLUENZA VACCINE  Completed   Fall Risk  09/02/2015 04/29/2015 04/01/2015 02/11/2015 11/05/2014  Falls in the past year? No No No No No  Number falls in past yr: - - - - -  Injury with Fall? - - - - -  Risk Factor Category  - - - - -  Risk for fall due to : - - - - -  Risk for fall due to (comments): - - - - -   Functional Status Survey:    Vitals:   01/06/16 0946  BP: 113/76  Pulse: 77  Resp: 16  Temp: 97 F (36.1 C)  SpO2: 90%  Weight: 98 lb 8 oz (44.7 kg)  Height: 5\' 1"  (1.549 m)   Body mass index is 18.61 kg/m. Physical Exam  Labs reviewed:  Recent Labs  04/03/15 07/07/15 11/06/15  NA 139 139 138  K 4.6 4.7 4.5  BUN 14 14 16   CREATININE 0.9 0.9 0.8    Recent Labs  04/03/15 11/06/15  AST 18 24  ALT 9 10  ALKPHOS 85 99    Recent Labs  04/03/15 11/06/15  WBC 7.1 6.9  HGB 12.0 12.1  HCT 36 37  PLT 269 270   Lab Results  Component Value Date   TSH 3.08 11/06/2015   Lab Results  Component Value Date   HGBA1C 6.4 07/07/2015   Lab Results  Component Value Date   CHOL 148 07/07/2015   HDL 70 07/07/2015   LDLCALC 58 07/07/2015   TRIG 100 07/07/2015   CHOLHDL 3 07/09/2013    Significant Diagnostic Results in last 30 days:  No results found.  Assessment/Plan There are no diagnoses linked to this encounter.   Family/ staff Communication:   Labs/tests ordered:      This encounter was created in error - please disregard.

## 2016-01-13 ENCOUNTER — Telehealth: Payer: Self-pay | Admitting: Gastroenterology

## 2016-01-13 NOTE — Telephone Encounter (Signed)
Patient states that she has had two more "episodes" since talking to the nurse.

## 2016-01-13 NOTE — Telephone Encounter (Signed)
Pt states she is having issues with epigastric pain that is followed by burping for about 63minutes. She states she is taking omeprazole 20mg  BID along with FD GUARD BID and gas-x TID. She states that the gas-x was a capsule form and she doesn't know if that made a difference or not but she feels this needs to be addressed. Please advise of any new recommendations.

## 2016-01-14 ENCOUNTER — Other Ambulatory Visit: Payer: Self-pay

## 2016-01-14 DIAGNOSIS — R1013 Epigastric pain: Secondary | ICD-10-CM

## 2016-01-14 NOTE — Telephone Encounter (Signed)
Lets check for underlying H. Pylori with stool antigen test.

## 2016-01-14 NOTE — Telephone Encounter (Signed)
Spoke with pt and she is aware, orders in epic. 

## 2016-01-14 NOTE — Telephone Encounter (Signed)
Pt is calling back and wants to know what she needs to do

## 2016-01-15 ENCOUNTER — Other Ambulatory Visit: Payer: Medicare Other

## 2016-01-15 DIAGNOSIS — R1013 Epigastric pain: Secondary | ICD-10-CM

## 2016-01-16 ENCOUNTER — Telehealth: Payer: Self-pay | Admitting: Gastroenterology

## 2016-01-16 DIAGNOSIS — M545 Low back pain: Secondary | ICD-10-CM | POA: Diagnosis not present

## 2016-01-16 DIAGNOSIS — M533 Sacrococcygeal disorders, not elsewhere classified: Secondary | ICD-10-CM | POA: Diagnosis not present

## 2016-01-16 DIAGNOSIS — M961 Postlaminectomy syndrome, not elsewhere classified: Secondary | ICD-10-CM | POA: Diagnosis not present

## 2016-01-16 DIAGNOSIS — G8929 Other chronic pain: Secondary | ICD-10-CM | POA: Diagnosis not present

## 2016-01-16 LAB — HELICOBACTER PYLORI  SPECIAL ANTIGEN: H. PYLORI Antigen: NOT DETECTED

## 2016-01-16 NOTE — Telephone Encounter (Signed)
Patient notified that results will be faxed when they result.

## 2016-01-19 NOTE — Telephone Encounter (Signed)
Results faxed.

## 2016-01-22 ENCOUNTER — Telehealth: Payer: Self-pay | Admitting: Gastroenterology

## 2016-01-22 MED ORDER — RANITIDINE HCL 150 MG/10ML PO SYRP: 150.0000 mg | ORAL_SOLUTION | Freq: Two times a day (BID) | ORAL | 1 refills | Status: DC

## 2016-01-22 NOTE — Addendum Note (Signed)
Addended by: Marlon Pel on: 01/22/2016 10:21 AM   Modules accepted: Orders

## 2016-01-22 NOTE — Telephone Encounter (Signed)
Questions answered for patient about ranitidine.  She will call back for any additional questions or concerns.

## 2016-01-22 NOTE — Telephone Encounter (Signed)
Patient states that she is returning Sheri's call

## 2016-01-22 NOTE — Telephone Encounter (Signed)
Patient states that she can't swallow the ranitidine.  I have sent a rx for the liquid to her pharmacy

## 2016-01-30 ENCOUNTER — Ambulatory Visit: Payer: Medicare Other | Admitting: Gastroenterology

## 2016-02-02 ENCOUNTER — Telehealth: Payer: Self-pay | Admitting: Gastroenterology

## 2016-02-02 NOTE — Telephone Encounter (Signed)
Pt has been notified that coupons are at the front desk

## 2016-02-03 ENCOUNTER — Encounter: Payer: Self-pay | Admitting: Internal Medicine

## 2016-02-03 ENCOUNTER — Non-Acute Institutional Stay: Payer: Medicare Other | Admitting: Internal Medicine

## 2016-02-03 VITALS — BP 110/58 | HR 68 | Temp 97.3°F | Ht 61.0 in | Wt 98.0 lb

## 2016-02-03 DIAGNOSIS — K219 Gastro-esophageal reflux disease without esophagitis: Secondary | ICD-10-CM | POA: Diagnosis not present

## 2016-02-03 DIAGNOSIS — R634 Abnormal weight loss: Secondary | ICD-10-CM | POA: Diagnosis not present

## 2016-02-03 DIAGNOSIS — E039 Hypothyroidism, unspecified: Secondary | ICD-10-CM

## 2016-02-03 DIAGNOSIS — R1314 Dysphagia, pharyngoesophageal phase: Secondary | ICD-10-CM | POA: Diagnosis not present

## 2016-02-03 DIAGNOSIS — L299 Pruritus, unspecified: Secondary | ICD-10-CM | POA: Diagnosis not present

## 2016-02-03 DIAGNOSIS — I6523 Occlusion and stenosis of bilateral carotid arteries: Secondary | ICD-10-CM | POA: Diagnosis not present

## 2016-02-03 DIAGNOSIS — I480 Paroxysmal atrial fibrillation: Secondary | ICD-10-CM | POA: Diagnosis not present

## 2016-02-03 DIAGNOSIS — R0600 Dyspnea, unspecified: Secondary | ICD-10-CM

## 2016-02-03 DIAGNOSIS — E7800 Pure hypercholesterolemia, unspecified: Secondary | ICD-10-CM | POA: Diagnosis not present

## 2016-02-03 DIAGNOSIS — R0609 Other forms of dyspnea: Secondary | ICD-10-CM | POA: Diagnosis not present

## 2016-02-03 DIAGNOSIS — R739 Hyperglycemia, unspecified: Secondary | ICD-10-CM

## 2016-02-03 NOTE — Progress Notes (Signed)
[facility] Room Number: AL1  Place of Service: Clinic (12)     No Known Allergies  Chief Complaint  Patient presents with  . Medical Management of Chronic Issues    medication management A-Fib, thyroid, hyperglycemia    HPI:  Last seen by me 09/02/15.  Now seeing Dr. Ardis Hughs for her stomach problems. Took Carafate from 08/16/15 to 08/23/15. Has not resumed omeprazole. Still has a gurgling in the left lower abdafter she drinks something. She thinks she may be doing a little better. Never feels comfortable in stomach. Has done a little better on FD Guard, gas-X, and ranitidine. Omeprazole was discontined by Dr. Ardis Hughs. Continues to have a gas buildup that stops in her neck. Better if she burps.  SOB and fatigue are a little better. She is now of the opinion that her son's illness and death were related.Son died of cancer in the esophagus, stomach, and liver.  Saw Dr. Scot Dock at [facility]. Has varicosities. She thinks the compression hose were too tight. Also being followed for her carotid occlusion.  Chronic atrial fibrillation (HCC) - rate controlled  Hypothyroidism, unspecified hypothyroidism type - compensated  Carotid stenosis, bilateral - hx of <40% occlusion RICA and LICA  Dysphagia, pharyngoesophageal phase - improved, but still has some symptoms.  Dysphonia - chronically lax vocal cords  Loss of weight - Patient has not been eating well due to the problems of her gut. Weight is about the same as 5 mo ago.  Midline low back pain without sciatica - chronic. Dr. Nelva Bush is to do injection in her back neck week. Patient can get comfortable if sitting or laying.  Hyperglycemia - normal 07/07/2015  Hypercholesterolemia - controlled  Varicose veins - quite prominent in the right lower leg.  OCD (obsessive compulsive disorder) - no "rituals" but she dwells on things a lot.  Mild paronychia of the right 2nd finger.  Continues to have itching of her  back. Using TCA cream. Sarna was no help. .  Medications: Patient's Medications  New Prescriptions   No medications on file  Previous Medications   ALPRAZOLAM (XANAX) 0.25 MG TABLET    Take 0.25 mg by mouth daily as needed for anxiety. Reported on 02/11/2015   ASPIRIN 81 MG TABLET    Take 81 mg by mouth daily.   ATENOLOL (TENORMIN) 25 MG TABLET    Take 1 tablet (25 mg total) by mouth 2 (two) times daily.   DOCUSATE SODIUM (COLACE) 100 MG CAPSULE    Take 100 mg by mouth daily. Take 1 capsule daily for constipation   GABAPENTIN (NEURONTIN) 100 MG CAPSULE    TAKE 1 CAPSULE AT BEDTIME.   MULTIPLE VITAMINS-CALCIUM (VIACTIV MULTI-VITAMIN PO)    Take 1 tablet by mouth daily.   MULTIPLE VITAMINS-MINERALS (CERTAVITE SENIOR/ANTIOXIDANT PO)    Take by mouth. Take one tablet by mouth daily   PSYLLIUM (METAMUCIL) 58.6 % POWDER    Take 1 packet by mouth daily. Mix with 8 oz of water or juice.   RANITIDINE (ZANTAC) 150 MG/10ML SYRUP    Take 10 mLs (150 mg total) by mouth 2 (two) times daily.   SIMVASTATIN (ZOCOR) 40 MG TABLET    TAKE (1/2) TABLET DAILY.  Modified Medications   No medications on file  Discontinued Medications   No medications on file     Review of Systems  Constitutional: Negative for chills, diaphoresis and fever.  HENT: Positive for hearing loss. Negative for congestion, ear discharge, ear  pain, sore throat and tinnitus.        Chronic sinus congestion. Dentures. Xerostomia.  Eyes: Negative for pain and redness.  Respiratory: Positive for shortness of breath (On exertion). Negative for cough and wheezing.   Cardiovascular: Positive for palpitations. Negative for chest pain and leg swelling.       Hx AF  Gastrointestinal: Negative for abdominal pain, constipation, diarrhea and nausea.       HX GI bleed In Sept 2015. Difficulty swallowing solids. Avoids beef. Barium swallow 08/09/14 showed tight cricopharyngeus muscle, GERD, and mild esophageal dysmotility.  Endocrine: Negative  for polydipsia.  Genitourinary: Positive for frequency. Negative for dysuria, flank pain, hematuria and urgency.  Musculoskeletal: Positive for back pain. Negative for myalgias and neck pain.       Hx scoliosis and surgery of the lower back to partially correct it. Medial right knee pain  Skin: Negative for rash.       Chronic pruritus.  Has multiple lesions of the back including irritated seborrheic keratoses, atrophic plaques, multiple angiomas. Sebaceous cyst of the dorsal aspect of the left forearm. BLE varicose veins  Neurological: Positive for dizziness. Negative for tremors, seizures, weakness and headaches.       History of neuropathy with some numbness in the feet.  Hematological: Does not bruise/bleed easily.       Anemia resolved in May 2016  Psychiatric/Behavioral: Negative for hallucinations and suicidal ideas. The patient is nervous/anxious.     Vitals:   02/03/16 1120  BP: (!) 110/58  Pulse: 68  Temp: 97.3 F (36.3 C)  TempSrc: Oral  SpO2: 91%  Weight: 98 lb (44.5 kg)  Height: _0  (1.549 m)   Wt Readings from Last 3 Encounters:  02/03/16 98 lb (44.5 kg)  01/06/16 98 lb 8 oz (44.7 kg)  12/09/15 100 lb 9.6 oz (45.6 kg)    Body mass index is 18.52 kg/m.  Physical Exam  Constitutional: She is oriented to person, place, and time. She appears well-developed and well-nourished. No distress.  Thin, frail.  HENT:  Right Ear: External ear normal.  Left Ear: External ear normal.  Nose: Nose normal.  Mouth/Throat: Oropharynx is clear and moist. No oropharyngeal exudate.  Eyes: Conjunctivae and EOM are normal. Pupils are equal, round, and reactive to light. No scleral icterus.  Neck: No JVD present. No tracheal deviation present. No thyromegaly present.  Cardiovascular: Normal rate, normal heart sounds and intact distal pulses.  Exam reveals no gallop and no friction rub.   No murmur heard. Atrial fibrillation. Controlled rate. Varicose veins in both legs.    Pulmonary/Chest: Effort normal. No respiratory distress. She has no wheezes. She has no rales. She exhibits no tenderness.  Hoarse  Abdominal: She exhibits no distension and no mass. There is no tenderness.  Musculoskeletal: Normal range of motion. She exhibits no edema or tenderness.  Scar in lumbar area secondary prior surgery. Some difficulty lying down and getting up from the exam table.  Lymphadenopathy:    She has no cervical adenopathy.  Neurological: She is alert and oriented to person, place, and time. No cranial nerve deficit. Coordination normal.  09/02/2015 MMSE 29/30. Passed clock drawing..  Skin: No rash noted. She is not diaphoretic. No erythema. No pallor.  Multiple seborrheic keratoses the temple and on the back. Varicose veins.  Mild paronychia of the right 2nd finger.  Psychiatric: She has a normal mood and affect. Her behavior is normal. Judgment and thought content normal.     Labs  reviewed: Lab Summary Latest Ref Rng & Units 11/06/2015  Hemoglobin 12.0 - 16.0 g/dL 12.1  Hematocrit 36 - 46 % 37  White count 10:3/mL 6.9  Platelet count 150 - 399 K/L 270  Sodium 137 - 147 mmol/L 138  Potassium 3.4 - 5.3 mmol/L 4.5  Calcium - (None)  Phosphorus - (None)  Creatinine 0.5 - 1.1 mg/dL 0.8  AST 13 - 35 U/L 24  Alk Phos 25 - 125 U/L 99  Bilirubin - (None)  Glucose mg/dL 86  Cholesterol - (None)  HDL cholesterol - (None)  Triglycerides - (None)  LDL Direct - (None)  LDL Calc - (None)  Total protein - (None)  Albumin - (None)  Some recent data might be hidden   Lab Results  Component Value Date   TSH 3.08 11/06/2015   Lab Results  Component Value Date   BUN 16 11/06/2015   BUN 14 07/07/2015   BUN 14 04/03/2015   Lab Results  Component Value Date   CREATININE 0.8 11/06/2015   CREATININE 0.9 07/07/2015   CREATININE 0.9 04/03/2015   Lab Results  Component Value Date   HGBA1C 6.4 07/07/2015   HGBA1C 6.6 (A) 04/05/2014        Assessment/Plan  1. Gastroesophageal reflux disease without esophagitis A little better. Continue to see Dr. Ardis Hughs.  2. DOE (dyspnea on exertion) stable  3. Paroxysmal atrial fibrillation (HCC) controlled  4. Hypothyroidism, unspecified type - TSH; Future  5. Hyperglycemia - CMP; Future  6. Hypercholesterolemia - Lipid panel; Future  7. Bilateral carotid artery stenosis unchanged  8. Dysphagia, pharyngoesophageal phase improved  9. Pruritus -continue TCA cream  10. Loss of weight stable - tsh, CMP; Future

## 2016-02-10 DIAGNOSIS — M5136 Other intervertebral disc degeneration, lumbar region: Secondary | ICD-10-CM | POA: Diagnosis not present

## 2016-02-13 ENCOUNTER — Telehealth: Payer: Self-pay | Admitting: Gastroenterology

## 2016-02-16 NOTE — Telephone Encounter (Signed)
FDguard coupons left at the front desk.  Pt also set up for follow up with Dr Ardis Hughs.  Pt will keep appt and pick up coupons.

## 2016-02-17 ENCOUNTER — Telehealth: Payer: Self-pay | Admitting: Gastroenterology

## 2016-02-17 NOTE — Telephone Encounter (Signed)
The pt states she has 1 refill left of zantac and will let her pharmacy know when she needs another refill.  She is anxious about her appt being in March.  She was put on the wait list for sooner appt is we have cancellations.  She thanked me for calling and will call with any further concerns prior to appt

## 2016-03-01 DIAGNOSIS — M961 Postlaminectomy syndrome, not elsewhere classified: Secondary | ICD-10-CM | POA: Diagnosis not present

## 2016-03-01 DIAGNOSIS — M533 Sacrococcygeal disorders, not elsewhere classified: Secondary | ICD-10-CM | POA: Diagnosis not present

## 2016-03-01 DIAGNOSIS — M5136 Other intervertebral disc degeneration, lumbar region: Secondary | ICD-10-CM | POA: Diagnosis not present

## 2016-03-05 ENCOUNTER — Encounter: Payer: Self-pay | Admitting: Nurse Practitioner

## 2016-03-05 ENCOUNTER — Non-Acute Institutional Stay: Payer: Medicare Other | Admitting: Nurse Practitioner

## 2016-03-05 DIAGNOSIS — F411 Generalized anxiety disorder: Secondary | ICD-10-CM

## 2016-03-05 DIAGNOSIS — K5900 Constipation, unspecified: Secondary | ICD-10-CM | POA: Diagnosis not present

## 2016-03-05 DIAGNOSIS — G629 Polyneuropathy, unspecified: Secondary | ICD-10-CM | POA: Diagnosis not present

## 2016-03-05 DIAGNOSIS — K219 Gastro-esophageal reflux disease without esophagitis: Secondary | ICD-10-CM

## 2016-03-05 DIAGNOSIS — E039 Hypothyroidism, unspecified: Secondary | ICD-10-CM | POA: Diagnosis not present

## 2016-03-05 DIAGNOSIS — I480 Paroxysmal atrial fibrillation: Secondary | ICD-10-CM | POA: Diagnosis not present

## 2016-03-05 NOTE — Assessment & Plan Note (Signed)
Daily prn Xanax 0.25mg  is adequate for her to rest at night.

## 2016-03-05 NOTE — Assessment & Plan Note (Signed)
TSH 3.08 11/06/15, not on med 

## 2016-03-05 NOTE — Assessment & Plan Note (Signed)
Heart rate is in control, continue Atenolol 25mg bid. 

## 2016-03-05 NOTE — Assessment & Plan Note (Addendum)
Chronic, pain an d feel cold BLE at night, dc Gabapentin 100mg  nightly when current supplies exhausted-mary try Tylenol 500mg  q6h prn.

## 2016-03-05 NOTE — Progress Notes (Signed)
Location:  [facility] Room Number: 1 Place of Service:  ALF [phone]) Provider:  Destry Dauber, Manxie  NP  Jeanmarie Hubert, MD  Patient Care Team: Estill Dooms, MD as PCP - General (Internal Medicine) Angelia Mould, MD as Attending Physician (Vascular Surgery) Ronald Lobo, MD (Gastroenterology) [name], MD as Attending Physician (Obstetrics and Gynecology) Amy Martinique, MD as Consulting Physician (Dermatology) Thornell Sartorius, MD (Otolaryngology) [name], MD (Orthopedic Surgery) [name], MD (Ophthalmology) [name], MD as Consulting Physician (Urology) [name], MD as Consulting Physician (Cardiology) [name], PA-C as Physician Assistant (Otolaryngology) [name], MD as Consulting Physician (Otolaryngology) [name], MD as Consulting Physician (Dermatology) [name], MD as Consulting Physician (Otolaryngology) [name], MD as Consulting Physician (Physical Medicine and Rehabilitation) [name], MD as Attending Physician (Gastroenterology) Russel Morain Otho Darner, NP as Nurse Practitioner (Internal Medicine)  Extended Emergency Contact Information Primary Emergency Contact: [place] Phone: [phone] Relation: Other Secondary Emergency Contact: [name] of [place] Phone: [phone] Mobile Phone: [phone] Relation: Son  Code Status:  Full Code Goals of care: Advanced Directive information Advanced Directives 03/05/2016  Does Patient Have a Medical Advance Directive? Yes  Type of Advance Directive [place]  Does patient want to make changes to medical advance directive? No - Patient declined  Copy of [place] in Chart? Yes  Would patient like information on creating a medical advance directive? -     Chief Complaint  Patient presents with  . Medical Management of Chronic Issues    HPI:  Pt is a 83 y.o.  female seen today for medical management of chronic diseases.      Hx of Peripheral neuropathy, taking Gabapentin 100mg  nightly, not working, mainly pain and cool feeling in BLE at night, f/u Neurology,  GERD has been stable except occasional heartburn symptoms while on Ranitidine 150mg  bid, DF Gard II bid, f/u GI, not constipation while on Colace and Metamucil, sleeps and rest at night with help of Alprazolam prn. Hx of Afib, heart rate is in control, taking Atenolol 25mg  bid. Chronic lower back pain-f/u Ortho, mainly in SIJ R+L, had a few inj, pending left SIJ inj.    Past Medical History:  Diagnosis Date  . Abdominal bloating   . Atrial fibrillation ([place])   . Bruises easily   . Carotid artery occlusion    LEFT  . Dizziness   . DOE (dyspnea on exertion) 04/02/2014  . Fall at home Sept 2013, Dec. 2013  Jun 08, 2012  . GERD (gastroesophageal reflux disease) 02/11/2015  . Headache(784.0)   . Hoarseness   . Hypercholesterolemia   . Hyperglycemia 04/02/2014   Glucose 178 mg percent on 01/22/2014 04/05/14 Hgb A1c 6.6 Diet controlled.    . Hypothyroidism 04/15/2015  . Neuropathy (HCC)    PERIPHERAL  . Pruritus   . Scoliosis   . Varicose veins    Past Surgical History:  Procedure Laterality Date  . ABDOMINAL HYSTERECTOMY  1954  . CHOLECYSTECTOMY  1997  . SPINE SURGERY  1997   correct scoliosis    No Known Allergies  Allergies as of 03/05/2016   No Known Allergies     Medication List       Accurate as of 03/05/16  3:56 PM. Always use your most recent med list.          ALPRAZolam 0.25 MG tablet Commonly known as:  Duanne Moron Take  0.25 mg by mouth daily as needed for anxiety. Reported on 02/11/2015   aspirin 81 MG tablet Take 81 mg by mouth daily.   atenolol 25 MG tablet Commonly known as:  TENORMIN Take 1 tablet (25 mg total) by mouth 2 (two) times daily.   CERTAVITE SENIOR/ANTIOXIDANT PO Take by mouth. Take one tablet by mouth daily   docusate sodium 100 MG  capsule Commonly known as:  COLACE Take 100 mg by mouth daily. Take 1 capsule daily for constipation   gabapentin 100 MG capsule Commonly known as:  NEURONTIN TAKE 1 CAPSULE AT BEDTIME.   psyllium 58.6 % powder Commonly known as:  METAMUCIL Take 1 packet by mouth daily. Mix with 8 oz of water or juice.   ranitidine 150 MG/10ML syrup Commonly known as:  ZANTAC Take 10 mLs (150 mg total) by mouth 2 (two) times daily.   simvastatin 40 MG tablet Commonly known as:  ZOCOR TAKE (1/2) TABLET DAILY.   VIACTIV MULTI-VITAMIN PO Take 1 tablet by mouth daily.       Review of Systems  Constitutional: Negative for chills, diaphoresis and fever.  HENT: Positive for hearing loss. Negative for congestion, ear discharge, ear pain, sore throat and tinnitus.        Chronic sinus congestion. Dentures. Xerostomia.  Eyes: Negative for pain and redness.  Respiratory: Positive for shortness of breath (On exertion). Negative for cough and wheezing.   Cardiovascular: Positive for palpitations. Negative for chest pain and leg swelling.       Hx AF  Gastrointestinal: Negative for abdominal pain, constipation, diarrhea and nausea.       HX GI bleed In Sept 2015. Difficulty swallowing solids. Avoids beef. Barium swallow 08/09/14 showed tight cricopharyngeus muscle, GERD, and mild esophageal dysmotility.  Endocrine: Negative for polydipsia.  Genitourinary: Positive for frequency. Negative for dysuria, flank pain, hematuria and urgency.  Musculoskeletal: Positive for back pain. Negative for myalgias and neck pain.       Hx scoliosis and surgery of the lower back to partially correct it. Medial right knee pain  Skin: Negative for rash.       Chronic pruritus.  Has multiple lesions of the back including irritated seborrheic keratoses, atrophic plaques, multiple angiomas. Sebaceous cyst of the dorsal aspect of the left forearm. BLE varicose veins  Neurological: Positive for dizziness. Negative for  tremors, seizures, weakness and headaches.       History of neuropathy with some numbness in the feet.  Hematological: Does not bruise/bleed easily.       Anemia resolved in May 2016  Psychiatric/Behavioral: Negative for hallucinations and suicidal ideas. The patient is nervous/anxious.     Immunization History  Administered Date(s) Administered  . Influenza Split 10/20/2011, 11/03/2012  . Influenza,inj,Quad PF,36+ Mos 09/29/2012, 11/09/2013  . Influenza-Unspecified 10/17/2014, 10/23/2015  . PPD Test 08/21/2013, 03/04/2014  . Pneumococcal Conjugate-13 08/21/2013  . Tdap 05/20/2008   Pertinent  Health Maintenance Due  Topic Date Due  . DEXA SCAN  01/31/1987  . PNA vac Low Risk Adult (2 of 2 - PPSV23) 08/22/2014  . INFLUENZA VACCINE  Completed   Fall Risk  09/02/2015 04/29/2015 04/01/2015 02/11/2015 11/05/2014  Falls in the past year? No No No No No  Number falls in past yr: - - - - -  Injury with Fall? - - - - -  Risk Factor Category  - - - - -  Risk for fall due to : - - - - -  Risk for fall due  to (comments): - - - - -   Functional Status Survey:    Vitals:   03/05/16 1336  BP: 138/68  Pulse: 78  Resp: 18  Temp: 97.3 F (36.3 C)  SpO2: 98%  Weight: 100 lb 6.4 oz (45.5 kg)  Height: 5\' 1"  (1.549 m)   Body mass index is 18.97 kg/m. Physical Exam  Constitutional: She is oriented to person, place, and time. She appears well-developed and well-nourished. No distress.  Thin, frail.  HENT:  Right Ear: External ear normal.  Left Ear: External ear normal.  Nose: Nose normal.  Mouth/Throat: Oropharynx is clear and moist. No oropharyngeal exudate.  Eyes: Conjunctivae and EOM are normal. Pupils are equal, round, and reactive to light. No scleral icterus.  Neck: No JVD present. No tracheal deviation present. No thyromegaly present.  Cardiovascular: Normal rate, normal heart sounds and intact distal pulses.  Exam reveals no gallop and no friction rub.   No murmur heard. Atrial  fibrillation. Controlled rate. Varicose veins in both legs.  Pulmonary/Chest: Effort normal. No respiratory distress. She has no wheezes. She has no rales. She exhibits no tenderness.  Hoarse  Abdominal: She exhibits no distension and no mass. There is no tenderness.  Musculoskeletal: Normal range of motion. She exhibits no edema or tenderness.  Scar in lumbar area secondary prior surgery. Some difficulty lying down and getting up from the exam table.  Lymphadenopathy:    She has no cervical adenopathy.  Neurological: She is alert and oriented to person, place, and time. No cranial nerve deficit. Coordination normal.  09/02/2015 MMSE 29/30. Passed clock drawing..  Skin: No rash noted. She is not diaphoretic. No erythema. No pallor.  Multiple seborrheic keratoses the temple and on the back. Varicose veins.  Mild paronychia of the right 2nd finger.  Psychiatric: She has a normal mood and affect. Her behavior is normal. Judgment and thought content normal.    Labs reviewed:  Recent Labs  04/03/15 07/07/15 11/06/15  NA 139 139 138  K 4.6 4.7 4.5  BUN 14 14 16   CREATININE 0.9 0.9 0.8    Recent Labs  04/03/15 11/06/15  AST 18 24  ALT 9 10  ALKPHOS 85 99    Recent Labs  04/03/15 11/06/15  WBC 7.1 6.9  HGB 12.0 12.1  HCT 36 37  PLT 269 270   Lab Results  Component Value Date   TSH 3.08 11/06/2015   Lab Results  Component Value Date   HGBA1C 6.4 07/07/2015   Lab Results  Component Value Date   CHOL 148 07/07/2015   HDL 70 07/07/2015   LDLCALC 58 07/07/2015   TRIG 100 07/07/2015   CHOLHDL 3 07/09/2013    Significant Diagnostic Results in last 30 days:  No results found.  Assessment/Plan Atrial fibrillation (HCC) Heart rate is in control, continue Atenolol 25mg  bid.  GERD (gastroesophageal reflux disease) Occasional heartburn symptoms, continue Ranitidine 150mg  bid, FDGard II bid, GI 12/01/15   Constipation Stable, continue Colace, Metamucil. Observe.     Hypothyroidism TSH 3.08 11/06/15, not on med  Neuropathy Chronic, pain an d feel cold BLE at night, dc Gabapentin 100mg  nightly when current supplies exhausted-mary try Tylenol 500mg  q6h prn.   Anxiety state Daily prn Xanax 0.25mg  is adequate for her to rest at night.      Family/ staff Communication: AL  Labs/tests ordered:  none

## 2016-03-05 NOTE — Assessment & Plan Note (Addendum)
Occasional heartburn symptoms, continue Ranitidine 150mg bid, FDGard II bid, GI 12/01/15   

## 2016-03-05 NOTE — Assessment & Plan Note (Signed)
Stable, continue Colace, Metamucil. Observe.  

## 2016-03-12 ENCOUNTER — Telehealth: Payer: Self-pay | Admitting: Gastroenterology

## 2016-03-12 NOTE — Telephone Encounter (Signed)
Pt calling back regarding this. Best call back # is [phone].

## 2016-03-15 ENCOUNTER — Other Ambulatory Visit: Payer: Self-pay | Admitting: Gastroenterology

## 2016-03-15 MED ORDER — RANITIDINE HCL 150 MG/10ML PO SYRP: 150.0000 mg | ORAL_SOLUTION | Freq: Two times a day (BID) | ORAL | 3 refills | Status: DC

## 2016-03-15 NOTE — Telephone Encounter (Signed)
Pt needed refill on zantac, this was sent to her pharmacy as directed.

## 2016-03-15 NOTE — Telephone Encounter (Signed)
Pt is calling again requesting someone give her a call back about her medication concern.

## 2016-03-22 ENCOUNTER — Telehealth: Payer: Self-pay

## 2016-03-22 NOTE — Telephone Encounter (Signed)
Phone call from pt.  Reported she has had "spells" with numbness in her toes on both feet.  Reported the numbness is intermittent.  Denied pain in toes associated with the numbness.  Stated that she is able to move the toes without difficulty.  Denied any pain in lower extremities associated with walking, or at rest.  Stated she has an occasional "shooting pain" in her legs. Stated "the leg pain happens very infrequently."  Denied any open, nonhealing sores of LE's.  Also, reported an episode with middle finger of left hand, that became "white and tingling", when experienced coldness of the hand.  Reported she is keeping her hands warm with gloves, to prevent this from reoccurring.  Advised that with the toe numbness, and known peripheral neuropathy, she should f/u with Neurology.  Pt. Verb. Understanding.

## 2016-03-23 ENCOUNTER — Ambulatory Visit: Payer: Medicare Other | Admitting: Gastroenterology

## 2016-03-23 ENCOUNTER — Telehealth: Payer: Self-pay

## 2016-03-23 ENCOUNTER — Telehealth: Payer: Self-pay | Admitting: Gastroenterology

## 2016-03-23 NOTE — Telephone Encounter (Signed)
Patient had called to see if she could talk with Dr. Nyoka Cowden, about Dr. Edison Nasuti. She had called their office yesterday but the office was closed, "closed". I reminded her that it did snow yesterday and that our office closed also. "It did"!. She had appt with Dr. Edison Nasuti this morning but had to cancel because [facility] wasn't getting the Lucianne Lei out. Next  appt is not until 05/21/16. Asked her what she wanted Dr. Nyoka Cowden to do for her, she wasn't sure, she is just upset and wants to see Dr Edison Nasuti. I asked if she wanted [place] to call their office to get her in sooner, "No". She said she would call and see if she could get appt with PA. She said that maybe she would get appt with Dr. Nyoka Cowden before he leaves to talk over things. I offered to make her an appt, but she said she would call back.

## 2016-03-23 NOTE — Telephone Encounter (Signed)
Pt has been notified that refill was sent on 03/15/16.

## 2016-03-29 ENCOUNTER — Other Ambulatory Visit: Payer: Self-pay | Admitting: Internal Medicine

## 2016-03-31 ENCOUNTER — Telehealth: Payer: Self-pay | Admitting: Gastroenterology

## 2016-04-01 ENCOUNTER — Ambulatory Visit (INDEPENDENT_AMBULATORY_CARE_PROVIDER_SITE_OTHER): Payer: Medicare Other | Admitting: Internal Medicine

## 2016-04-01 VITALS — BP 112/58 | HR 80 | Ht 61.0 in | Wt 101.0 lb

## 2016-04-01 DIAGNOSIS — I6523 Occlusion and stenosis of bilateral carotid arteries: Secondary | ICD-10-CM

## 2016-04-01 DIAGNOSIS — E7800 Pure hypercholesterolemia, unspecified: Secondary | ICD-10-CM

## 2016-04-01 DIAGNOSIS — I6522 Occlusion and stenosis of left carotid artery: Secondary | ICD-10-CM

## 2016-04-01 DIAGNOSIS — I4891 Unspecified atrial fibrillation: Secondary | ICD-10-CM

## 2016-04-01 NOTE — Telephone Encounter (Signed)
The pt advised that she had another refill on her ranitidine and would like to be called if we have a cancellation.  She is on the cancellation list

## 2016-04-01 NOTE — Patient Instructions (Signed)
Dr. Louis Meckel - urologist  You can HOLD aspirin 5 days prior to back injection   Your physician wants you to follow-up in: 6 months with Dr. Debara Pickett. You will receive a reminder letter in the mail two months in advance. If you don't receive a letter, please call our office to schedule the follow-up appointment.

## 2016-04-01 NOTE — Telephone Encounter (Signed)
Patient calling back regarding this. Best # [phone]

## 2016-04-02 DIAGNOSIS — R339 Retention of urine, unspecified: Secondary | ICD-10-CM | POA: Diagnosis not present

## 2016-04-02 NOTE — Progress Notes (Signed)
OFFICE NOTE  Chief Complaint:  Joint pain  Primary Care Physician: Jeanmarie Hubert, MD  HPI:  Vanessa [name] is a 83 y.o. female who is a new patient of mine and was previously followed by Dr. Darlin [name]. She had a long-standing relationship with Dr. Mare Ferrari and since he recently retired has establish care with me. I have reviewed his extensive notes which indicate Vanessa [name] has a history of atrial fibrillation, GI bleeding in the past, 100% occlusion of the left internal carotid artery (followed by vascular surgery, and varicose veins. She also has dyslipidemia and a number of other noncardiac problems. In general, however she seems to do quite well. She is in assisted living at friend's home but is quite independent. Unfortunately, one of her sons currently has a cancer and does not seem to have a good prognosis. She does have another son who lives in [place] who she visited with over the Mother's Day weekend. She denies any chest pain or worsening shortness of breath recently. Her A. fib is chronic and she is rate controlled. She is not on anticoagulation again due to GI bleeding in the past and high risk for future bleeding and therefore only takes aspirin. Given her age, she is not a candidate for the Watchman left atrial appendage occluder device.  06/12/2015  Vanessa [name] was seen today for an urgent visit for chest pain. This morning she called into the office and she was noted to be having some chest discomfort. She is been notably anxious recently due to her son who is in [place] and has been having some health issues. Based on the stress and anxiety she was advised to try to take some Xanax to see if it improved her symptoms. She reports some mild improvement but still has some uneasiness in her chest. I asked her to come in for evaluation as she does have a strong family history of coronary disease without any coronary testing in the recent past. EKG today was reassuring  demonstrated no ischemic changes, atrial fibrillation with rate control at 60. She currently reports her pain as a 2-3 out of 10, nonradiating, present at rest and unchanged.  09/30/2015  Vanessa [name] was seen today for follow-up. Unfortunately the interim her son died this summer after a short illness. There is a significant amount of stress related to this which I think was causing her chest pain. That seems to resolve somewhat. Blood pressure appears normal today 120/56. EKG is unchanged and shows persistent A. fib. Again she is not anticoagulated due to history of falls and GI bleeding in the past.  04/02/2016  Vanessa [name] seen today in follow-up. She continues to have some joint pain and lower extremity pain which is likely neuropathy. She is pre-C on gabapentin only 100 mg daily at bedtime and this was discontinued. It may be that it was not effective because she was on such a low dose. She also wondered if it was okay for her to stop aspirin for 7 days prior to injections. Apparently Dr. Dossie Der with [facility] orthopedics is interested in doing some injections. I don't see any problems with her doing that. She does have atrial fibrillation which is persistence however it would only be for short period of time patches not been a candidate for anticoagulation due to falls and bleeding risk.  PMHx:  Past Medical History:  Diagnosis Date  . Abdominal bloating   . Atrial fibrillation ([place])   . Bruises easily   .  Carotid artery occlusion    LEFT  . Dizziness   . DOE (dyspnea on exertion) 04/02/2014  . Fall at home Sept 2013, Dec. 2013  Jun 08, 2012  . GERD (gastroesophageal reflux disease) 02/11/2015  . Headache(784.0)   . Hoarseness   . Hypercholesterolemia   . Hyperglycemia 04/02/2014   Glucose 178 mg percent on 01/22/2014 04/05/14 Hgb A1c 6.6 Diet controlled.    . Hypothyroidism 04/15/2015  . Neuropathy (HCC)    PERIPHERAL  . Pruritus   . Scoliosis   . Varicose veins     Past Surgical  History:  Procedure Laterality Date  . ABDOMINAL HYSTERECTOMY  1954  . CHOLECYSTECTOMY  1997  . SPINE SURGERY  1997   correct scoliosis    FAMHx:  Family History  Problem Relation Age of Onset  . Adopted: Yes  . Diabetes Mother   . Heart attack Mother   . Heart disease Mother     After age 48  . Heart attack Father   . Stroke Father   . Cancer Sister     Breast  . Heart disease Maternal Grandmother   . Cancer Son     adopted son. esophagus, stomach, liver    SOCHx:   reports that she has never smoked. She has never used smokeless tobacco. She reports that she does not drink alcohol or use drugs.  ALLERGIES:  No Known Allergies  ROS: Pertinent items noted in HPI and remainder of comprehensive ROS otherwise negative.  HOME MEDS: Current Outpatient Prescriptions  Medication Sig Dispense Refill  . ALPRAZolam (XANAX) 0.25 MG tablet Take 0.25 mg by mouth daily as needed for anxiety. Reported on 02/11/2015    . aspirin 81 MG tablet Take 81 mg by mouth daily.    Marland Kitchen atenolol (TENORMIN) 25 MG tablet TAKE 1 TABLET TWICE DAILY. 180 tablet 0  . gabapentin (NEURONTIN) 100 MG capsule TAKE 1 CAPSULE AT BEDTIME. 30 capsule 3  . Multiple Vitamins-Calcium (VIACTIV MULTI-VITAMIN PO) Take 1 tablet by mouth daily.    . Multiple Vitamins-Minerals (CERTAVITE SENIOR/ANTIOXIDANT PO) Take by mouth. Take one tablet by mouth daily    . ranitidine (ZANTAC) 75 MG/5ML syrup TAKE 2 TEASPOONFULS(10 MLS) TWICE DAILY. 600 mL 0  . simvastatin (ZOCOR) 40 MG tablet TAKE (1/2) TABLET DAILY. 45 tablet 1   No current facility-administered medications for this visit.     LABS/IMAGING: No results found for this or any previous visit (from the past 48 hour(s)). No results found.  WEIGHTS: Wt Readings from Last 3 Encounters:  04/01/16 101 lb (45.8 kg)  03/05/16 100 lb 6.4 oz (45.5 kg)  02/03/16 98 lb (44.5 kg)    VITALS: BP (!) 112/58   Pulse 80   Ht 5\' 1"  (1.549 m)   Wt 101 lb (45.8 kg)   BMI 19.08  kg/m   EXAM: General appearance: alert, appears stated age and no distress Neck: no carotid bruit and no JVD Lungs: clear to auscultation bilaterally Heart: irregularly irregular rhythm Abdomen: soft, non-tender; bowel sounds normal; no masses,  no organomegaly Extremities: extremities normal, atraumatic, no cyanosis or edema and varicose veins noted Pulses: 2+ and symmetric Skin: Skin color, texture, turgor normal. No rashes or lesions Neurologic: Grossly normal Psych: Pleasant  EKG: Atrial fibrillation at 80  ASSESSMENT: 1. Atypical chest pain 2. Anxiety 3. Chronic atrial fibrillation 4. CHADSVASC score of 6-not anticoagulated due to GI bleeding in the past and falls 5. Dyslipidemia 6. Occluded left carotid artery  PLAN: 1.  Mrs. Ken is doing well and remains in atrial fibrillation which is chronic. She's not anticoagulated due to GI bleeding and falls in the past. She can come off aspirin as needed for at least 7 days prior to injections. She may want to consider talking to her primary care provider about restarting gabapentin perhaps even 3 times daily for more neuropathy the benefit.  Follow-up in 6 months.  Pixie Casino, MD, [facility] Attending Cardiologist [place] C Alanis Clift 04/02/2016, 1:31 PM

## 2016-04-06 DIAGNOSIS — R3912 Poor urinary stream: Secondary | ICD-10-CM | POA: Diagnosis not present

## 2016-04-06 DIAGNOSIS — N952 Postmenopausal atrophic vaginitis: Secondary | ICD-10-CM | POA: Diagnosis not present

## 2016-04-16 ENCOUNTER — Telehealth: Payer: Self-pay | Admitting: Gastroenterology

## 2016-04-16 NOTE — Telephone Encounter (Signed)
FD guard coupons left at the front desk

## 2016-04-19 ENCOUNTER — Non-Acute Institutional Stay: Payer: Medicare Other | Admitting: Internal Medicine

## 2016-04-19 ENCOUNTER — Encounter: Payer: Self-pay | Admitting: Internal Medicine

## 2016-04-19 DIAGNOSIS — R269 Unspecified abnormalities of gait and mobility: Secondary | ICD-10-CM

## 2016-04-19 DIAGNOSIS — G629 Polyneuropathy, unspecified: Secondary | ICD-10-CM | POA: Diagnosis not present

## 2016-04-19 DIAGNOSIS — E039 Hypothyroidism, unspecified: Secondary | ICD-10-CM

## 2016-04-19 DIAGNOSIS — I4891 Unspecified atrial fibrillation: Secondary | ICD-10-CM | POA: Diagnosis not present

## 2016-04-19 DIAGNOSIS — R2681 Unsteadiness on feet: Secondary | ICD-10-CM | POA: Insufficient documentation

## 2016-04-19 DIAGNOSIS — R634 Abnormal weight loss: Secondary | ICD-10-CM | POA: Diagnosis not present

## 2016-04-19 DIAGNOSIS — R739 Hyperglycemia, unspecified: Secondary | ICD-10-CM | POA: Diagnosis not present

## 2016-04-19 DIAGNOSIS — M5440 Lumbago with sciatica, unspecified side: Secondary | ICD-10-CM

## 2016-04-19 DIAGNOSIS — R1314 Dysphagia, pharyngoesophageal phase: Secondary | ICD-10-CM | POA: Diagnosis not present

## 2016-04-19 NOTE — Progress Notes (Signed)
Progress Note    Location:  [facility] Room Number: [place] of Service:  ALF [phone]) Provider:  Jeanmarie Hubert, MD  Patient Care Team: Estill Dooms, MD as PCP - General (Internal Medicine) Angelia Mould, MD as Attending Physician (Vascular Surgery) [name], MD as Attending Physician (Obstetrics and Gynecology) [name], MD (Orthopedic Surgery) [name], MD (Ophthalmology) [name], MD as Consulting Physician (Urology) [name], MD as Consulting Physician (Cardiology) [name], PA-C as Physician Assistant (Otolaryngology) [name], MD as Consulting Physician (Otolaryngology) [name], MD as Consulting Physician (Dermatology) [name], MD as Consulting Physician (Physical Medicine and Rehabilitation) [name], MD as Attending Physician (Gastroenterology) Man Otho Darner, NP as Nurse Practitioner (Internal Medicine) Kathrynn Ducking, MD as Consulting Physician (Neurology) Pixie Casino, MD as Consulting Physician (Cardiology) Druscilla Brownie, MD as Consulting Physician (Dermatology)  Extended Emergency Contact Information Primary Emergency Contact: [place] Phone: [phone] Relation: Other Secondary Emergency Contact: [name] of [place] Phone: [phone] Mobile Phone: [phone] Relation: Son  Code Status:  Full Code Goals of care: Advanced Directive information Advanced Directives 04/19/2016  Does Patient Have a Medical Advance Directive? Yes  Type of Paramedic of [place];Living will  Does patient want to make changes to medical advance directive? -  Copy of [place] in Chart? Yes  Would patient like information on creating a medical advance directive? -     Chief Complaint  Patient presents with  . Medical Management of Chronic Issues    routine visit    HPI:  Pt is a 83  y.o. female seen today for medical management of chronic diseases.    Atrial fibrillation, unspecified type ([place]) - rate controlled and anticoagulated wit  ASA.  Dysphagia, pharyngoesophageal phase - improved  Hyperglycemia - normal glucose Oct 2017  Hypothyroidism, unspecified type - hx elevated TSH. Normal TSH in Fall 2017 on no medications.  Neuropathy ([place]) - would like to see Dr. Jannifer Franklin again. She has been off gabapentin since Feb 2018.  Loss of weight - stable for several months  Low back pain with sciatica, sciatica laterality unspecified, unspecified back pain laterality, unspecified chronicity - stable  Gait abnormality with feelings of being off balance.    Past Medical History:  Diagnosis Date  . Abdominal bloating   . Atrial fibrillation ([place])   . Bruises easily   . Carotid artery occlusion    LEFT  . Dizziness   . DOE (dyspnea on exertion) 04/02/2014  . Fall at home Sept 2013, Dec. 2013  Jun 08, 2012  . GERD (gastroesophageal reflux disease) 02/11/2015  . Headache(784.0)   . Hoarseness   . Hypercholesterolemia   . Hyperglycemia 04/02/2014   Glucose 178 mg percent on 01/22/2014 04/05/14 Hgb A1c 6.6 Diet controlled.    . Hypothyroidism 04/15/2015  . Neuropathy (HCC)    PERIPHERAL  . Pruritus   . Scoliosis   . Varicose veins    Past Surgical History:  Procedure Laterality Date  . ABDOMINAL HYSTERECTOMY  1954  . CHOLECYSTECTOMY  1997  . SPINE SURGERY  1997   correct scoliosis    No Known Allergies  Allergies as of 04/19/2016   No Known Allergies     Medication List       Accurate as of 04/19/16  2:15 PM. Always use your most recent med list.  acetaminophen 500 MG tablet Commonly known as:  TYLENOL Take 500 mg by mouth. Take one tablet every 6 hours as needed   ALPRAZolam 0.25 MG tablet Commonly known as:  XANAX Take 0.25 mg by mouth. Take one tablet twice a day as needed for anxiety   aspirin 81 MG tablet Take 81 mg by mouth daily.     atenolol 25 MG tablet Commonly known as:  TENORMIN TAKE 1 TABLET TWICE DAILY.   CERTAVITE SENIOR/ANTIOXIDANT PO Take by mouth. Take one tablet by mouth daily   docusate sodium 100 MG capsule Commonly known as:  COLACE Take 100 mg by mouth. Take one capsule every day as needed for constipation   METAMUCIL PO Take by mouth. Take one tablespoon mix in 8 oz of water or juice.   ranitidine 150 MG tablet Commonly known as:  ZANTAC Take 150 mg by mouth. Take one tablet at at breakfast and one at bedtime   simvastatin 40 MG tablet Commonly known as:  ZOCOR TAKE (1/2) TABLET DAILY.   VIACTIV MULTI-VITAMIN PO Take 1 tablet by mouth daily.       Review of Systems  Constitutional: Negative for chills, diaphoresis and fever.  HENT: Positive for hearing loss. Negative for congestion, ear discharge, ear pain, sore throat and tinnitus.        Chronic sinus congestion. Dentures. Xerostomia.  Eyes: Negative for pain and redness.  Respiratory: Positive for shortness of breath (On exertion). Negative for cough and wheezing.   Cardiovascular: Positive for palpitations. Negative for chest pain and leg swelling.       Hx AF  Gastrointestinal: Negative for abdominal pain, constipation, diarrhea and nausea.       HX GI bleed In Sept 2015. Difficulty swallowing solids. Avoids beef. Barium swallow 08/09/14 showed tight cricopharyngeus muscle, GERD, and mild esophageal dysmotility.  Endocrine: Negative for polydipsia.  Genitourinary: Positive for frequency. Negative for dysuria, flank pain, hematuria and urgency.  Musculoskeletal: Positive for back pain and gait problem (feeling off balance). Negative for myalgias and neck pain.       Hx scoliosis and surgery of the lower back to partially correct it. Medial right knee pain  Skin: Negative for rash.       Chronic pruritus.  Has multiple lesions of the back including irritated seborrheic keratoses, atrophic plaques, multiple angiomas. Sebaceous  cyst of the dorsal aspect of the left forearm. BLE varicose veins  Neurological: Negative for dizziness, tremors, seizures, weakness and headaches.       History of neuropathy with some numbness and heavy sensations in the feet.  Hematological: Does not bruise/bleed easily.       Anemia resolved in May 2016  Psychiatric/Behavioral: Negative for hallucinations and suicidal ideas. The patient is nervous/anxious.     Immunization History  Administered Date(s) Administered  . Influenza Split 10/20/2011, 11/03/2012  . Influenza,inj,Quad PF,36+ Mos 09/29/2012, 11/09/2013  . Influenza-Unspecified 10/17/2014, 10/23/2015  . PPD Test 08/21/2013, 03/04/2014  . Pneumococcal Conjugate-13 08/21/2013  . Tdap 05/20/2008   Pertinent  Health Maintenance Due  Topic Date Due  . DEXA SCAN  01/31/1987  . PNA vac Low Risk Adult (2 of 2 - PPSV23) 08/22/2014  . INFLUENZA VACCINE  08/11/2016   Fall Risk  09/02/2015 04/29/2015 04/01/2015 02/11/2015 11/05/2014  Falls in the past year? No No No No No  Number falls in past yr: - - - - -  Injury with Fall? - - - - -  Risk Factor Category  - - - - -  Risk for fall due to : - - - - -  Risk for fall due to (comments): - - - - -     Vitals:   04/19/16 1357  BP: 127/74  Pulse: 77  Resp: 18  Temp: 97.7 F (36.5 C)  SpO2: (!) 83%  Weight: 99 lb 6.4 oz (45.1 kg)  Height: 5\' 1"  (1.549 m)   Body mass index is 18.78 kg/m.  Wt Readings from Last 3 Encounters:  04/19/16 99 lb 6.4 oz (45.1 kg)  04/01/16 101 lb (45.8 kg)  03/05/16 100 lb 6.4 oz (45.5 kg)    Physical Exam  Constitutional: She is oriented to person, place, and time. She appears well-developed and well-nourished. No distress.  Thin, frail.  HENT:  Right Ear: External ear normal.  Left Ear: External ear normal.  Nose: Nose normal.  Mouth/Throat: Oropharynx is clear and moist. No oropharyngeal exudate.  Eyes: Conjunctivae and EOM are normal. Pupils are equal, round, and reactive to light. No  scleral icterus.  Neck: No JVD present. No tracheal deviation present. No thyromegaly present.  Cardiovascular: Normal rate, normal heart sounds and intact distal pulses.  Exam reveals no gallop and no friction rub.   No murmur heard. Atrial fibrillation. Controlled rate. Varicose veins in both legs.  Pulmonary/Chest: Effort normal. No respiratory distress. She has no wheezes. She has no rales. She exhibits no tenderness.  Hoarse  Abdominal: She exhibits no distension and no mass. There is no tenderness.  Musculoskeletal: Normal range of motion. She exhibits no edema, tenderness or deformity.  Scar in lumbar area secondary prior surgery. Some difficulty lying down and getting up from the exam table.  Lymphadenopathy:    She has no cervical adenopathy.  Neurological: She is alert and oriented to person, place, and time. No cranial nerve deficit. Coordination normal.  09/02/2015 MMSE 29/30. Passed clock drawing..  Skin: No rash noted. She is not diaphoretic. No erythema. No pallor.  Multiple seborrheic keratoses the temple and on the back. Varicose veins.  Mild paronychia of the right 2nd finger.  Psychiatric: She has a normal mood and affect. Her behavior is normal. Judgment and thought content normal.    Labs reviewed:  Recent Labs  07/07/15 11/06/15  NA 139 138  K 4.7 4.5  BUN 14 16  CREATININE 0.9 0.8    Recent Labs  11/06/15  AST 24  ALT 10  ALKPHOS 99    Recent Labs  11/06/15  WBC 6.9  HGB 12.1  HCT 37  PLT 270   Lab Results  Component Value Date   TSH 3.08 11/06/2015   Lab Results  Component Value Date   HGBA1C 6.4 07/07/2015   Lab Results  Component Value Date   CHOL 148 07/07/2015   HDL 70 07/07/2015   LDLCALC 58 07/07/2015   TRIG 100 07/07/2015   CHOLHDL 3 07/09/2013     Assessment/Plan  1. Atrial fibrillation, unspecified type ([place]) rate controlled and anticoagulated with ASA  2. Dysphagia, pharyngoesophageal phase improved  3.  Hyperglycemia Glucose normal when checked Fall 2017  4. Hypothyroidism, unspecified type Normal unmedicated.   5. Neuropathy (HCC) Off gabapentin. Refer back to Dr. Jannifer Franklin.  6. Loss of weight Stable for several months.  7. Low back pain with sciatica, sciatica laterality unspecified, unspecified back pain laterality, unspecified chronicity improved  8. Abnormality of gait She believes this has gotten worse.

## 2016-04-19 NOTE — Addendum Note (Signed)
Addended by: Estill Dooms on: 04/19/2016 02:44 PM   Modules accepted: Level of Service

## 2016-04-28 ENCOUNTER — Telehealth: Payer: Self-pay | Admitting: Neurology

## 2016-04-28 NOTE — Telephone Encounter (Signed)
We'll try to get the patient worked in.

## 2016-04-28 NOTE — Telephone Encounter (Signed)
Debbie with Dr. Rolly Salter office is calling to see about getting the patient in to see Dr. Jannifer Franklin before 11-03-16 when she is scheduled. The patient is having sharp pain and cold sensation in feet. Please call patient to schedule at  [phone].

## 2016-04-28 NOTE — Telephone Encounter (Signed)
Called and LVM for pt letting her know we received message from [place] at Dr Rolly Salter office to schedule a sooner appt than 11/03/16. Asked her to call back to schedule.  *If she calls, can offer appt 05/12/16 at 12pm, check in 1130am. I can schedule if she wants this appt

## 2016-04-29 DIAGNOSIS — L728 Other follicular cysts of the skin and subcutaneous tissue: Secondary | ICD-10-CM | POA: Diagnosis not present

## 2016-04-29 DIAGNOSIS — D1800 Hemangioma unspecified site: Secondary | ICD-10-CM | POA: Diagnosis not present

## 2016-04-29 DIAGNOSIS — L820 Inflamed seborrheic keratosis: Secondary | ICD-10-CM | POA: Diagnosis not present

## 2016-04-29 DIAGNOSIS — L299 Pruritus, unspecified: Secondary | ICD-10-CM | POA: Diagnosis not present

## 2016-04-29 NOTE — Telephone Encounter (Signed)
Pt would like to accept 5-2 appointment

## 2016-04-29 NOTE — Telephone Encounter (Signed)
Scheduled patient for 05/12/16 at 12pm as requested.

## 2016-05-07 ENCOUNTER — Encounter: Payer: Self-pay | Admitting: Gastroenterology

## 2016-05-07 ENCOUNTER — Ambulatory Visit (INDEPENDENT_AMBULATORY_CARE_PROVIDER_SITE_OTHER): Payer: Medicare Other | Admitting: Gastroenterology

## 2016-05-07 VITALS — BP 102/60 | HR 68 | Ht 60.0 in | Wt 101.0 lb

## 2016-05-07 DIAGNOSIS — R140 Abdominal distension (gaseous): Secondary | ICD-10-CM

## 2016-05-07 DIAGNOSIS — I6523 Occlusion and stenosis of bilateral carotid arteries: Secondary | ICD-10-CM

## 2016-05-07 DIAGNOSIS — R1013 Epigastric pain: Secondary | ICD-10-CM

## 2016-05-07 NOTE — Progress Notes (Signed)
Review of pertinent gastrointestinal problems: 1. Dyspepsia, bloating, chronic:  Many years patient of Dr. Cristina Gong established care [facility] 2017; Symptoms possibly functional. I offered several times but she declined, higher risk given advanced age, frailty. Conservative measures: Gas ex. PPI. FD Guard samples helped but she has trouble affording them.  H. Pylori stool Ag 01/2016 negative; CBC, CMET normal 2017.   HPI: This is a pleasant 83 year old woman whom I last saw several months ago.    Chief complaint is mild constipation, bloating, lower abdominal discomfort  Has good and bad days.   Started on FD guard 2 pills twice daily, and gas ex bid, and ranitidine bid.  Stopped her omeprazole.  She could not swallow the ranitidine and so changed to a liquid form; 2 tsp bid. She overall feels better than prior to starting this regimen.    She's had a lot of discomfort, good days and bad days.  Death in family recently, heading to beach after this appointment.  She eats oatmeal in AM.  Then nutrition drink later in the morning.  This is comforting to her. She has abd discomfort before she eats and after she eats and then better.  Weight stable on our scale here (vs 5 months ago)  She has occasional constipation, prune juice helps.  The FD guard is very expensive, she has difficulty getting it outside of our office.  Gas ex is also expensive.  She saw her urologist because she just doesn't feel she's urinating enough.  She admits she doesn't drink enough.  ROS: complete GI ROS as described in HPI.  Constitutional:  No unintentional weight loss   Past Medical History:  Diagnosis Date  . Abdominal bloating   . Atrial fibrillation ([place])   . Bruises easily   . Carotid artery occlusion    LEFT  . Dizziness   . DOE (dyspnea on exertion) 04/02/2014  . Fall at home Sept 2013, Dec. 2013  Jun 08, 2012  . GERD (gastroesophageal reflux disease) 02/11/2015  . Headache(784.0)   . Hoarseness    . Hypercholesterolemia   . Hyperglycemia 04/02/2014   Glucose 178 mg percent on 01/22/2014 04/05/14 Hgb A1c 6.6 Diet controlled.    . Hypothyroidism 04/15/2015  . Neuropathy    PERIPHERAL  . Pruritus   . Scoliosis   . Varicose veins     Past Surgical History:  Procedure Laterality Date  . ABDOMINAL HYSTERECTOMY  1954  . CHOLECYSTECTOMY  1997  . SPINE SURGERY  1997   correct scoliosis    Current Outpatient Prescriptions  Medication Sig Dispense Refill  . acetaminophen (TYLENOL) 500 MG tablet Take 500 mg by mouth. Take one tablet every 6 hours as needed    . ALPRAZolam (XANAX) 0.25 MG tablet Take 0.25 mg by mouth. Take one tablet twice a day as needed for anxiety    . aspirin 81 MG tablet Take 81 mg by mouth daily.    Marland Kitchen atenolol (TENORMIN) 25 MG tablet TAKE 1 TABLET TWICE DAILY. 180 tablet 0  . Multiple Vitamins-Calcium (VIACTIV MULTI-VITAMIN PO) Take 1 tablet by mouth daily.    . Multiple Vitamins-Minerals (CERTAVITE SENIOR/ANTIOXIDANT PO) Take by mouth. Take one tablet by mouth daily    . ranitidine (ZANTAC) 150 MG tablet Take 150 mg by mouth. Take one tablet at at breakfast and one at bedtime    . simvastatin (ZOCOR) 40 MG tablet TAKE (1/2) TABLET DAILY. 45 tablet 1   No current facility-administered medications for this visit.  Allergies as of 05/07/2016  . (No Known Allergies)    Family History  Problem Relation Age of Onset  . Adopted: Yes  . Diabetes Mother   . Heart attack Mother   . Heart disease Mother     After age 47  . Heart attack Father   . Stroke Father   . Breast cancer Sister   . Heart disease Maternal Grandmother   . Cancer Son     adopted son. esophagus, stomach, liver  . Stomach cancer Neg Hx   . Colon cancer Neg Hx     Social History   Social History  . Marital status: Widowed    Spouse name: N/A  . Number of children: 2  . Years of education: 12+   Occupational History  . 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 Smoking status: Never Smoker  . Smokeless tobacco: Never Used  . Alcohol use No  . Drug use: No  . Sexual activity: No   Other Topics Concern  . Not on file   Social History Narrative   Patient lives at [facility] 03/05/2014   Patient is a widowed.    Patient is retired.    Patient has no children but adopted twin sons.    Patient has a college degree.    Never smoked   Alcohol none   Exercise with therapy   Walks with cane   POA       Patient only drinks de-caf drinks.   Patient is right handed.     Physical Exam: Ht 5' (1.524 m) Comment: w/o shoes  Wt 101 lb (45.8 kg)   BMI 19.73 kg/m  Constitutional: generally well-appearing Psychiatric: alert and oriented x3 Abdomen: soft, nontender, nondistended, no obvious ascites, no peritoneal signs, normal bowel sounds No peripheral edema noted in lower extremities  Assessment and plan: 83 y.o. female with Mild constipation, bloating, lower abdominal discomfort  I think her symptoms are functional, perhaps related to the fact that she is 83 and slowing down. She does not seem to stay hydrated very well. She would like to try to cut back on her current bowel regimen due to cost and I outlined a plan for her to cut down slowly from her FD guard, her gas ex, her ranitidine.  I recommended she try to stay hydrated with extra water or Gatorade throughout the day. She will continue prune juice as needed. I offered other testing, specifically CT scan 2 rule out significant problems such as neoplasm I told her I think it is unlikely that she has any of those type of problems. She elected against that. She will return to see me in 3-4 months.  Please see the "Patient Instructions" section for addition details about the plan.  Greater than 50% of this visit was spent in direct face-to-face counseling.  Total time of this visit was 45 min.   Owens Loffler, MD [facility] 05/07/2016, 10:35 AM

## 2016-05-07 NOTE — Patient Instructions (Addendum)
Decrease your FD guard to one pill twice daily for 3 weeks, then try one pill once daily.  At different time you can try tapering gas ex and ranitidine slowly over time.  Return to see Dr. Ardis Hughs on 08/09/16 at 11 am   Continue taking prune juice as needed for bowel trouble.  Try drinking gatorade for hydration (with equal part water).

## 2016-05-12 ENCOUNTER — Encounter: Payer: Self-pay | Admitting: Neurology

## 2016-05-12 ENCOUNTER — Ambulatory Visit (INDEPENDENT_AMBULATORY_CARE_PROVIDER_SITE_OTHER): Payer: Medicare Other | Admitting: Neurology

## 2016-05-12 VITALS — BP 145/60 | HR 56 | Ht 60.0 in | Wt 101.5 lb

## 2016-05-12 DIAGNOSIS — R202 Paresthesia of skin: Secondary | ICD-10-CM | POA: Insufficient documentation

## 2016-05-12 DIAGNOSIS — E538 Deficiency of other specified B group vitamins: Secondary | ICD-10-CM | POA: Diagnosis not present

## 2016-05-12 DIAGNOSIS — I6523 Occlusion and stenosis of bilateral carotid arteries: Secondary | ICD-10-CM | POA: Diagnosis not present

## 2016-05-12 MED ORDER — DULOXETINE HCL 20 MG PO CPEP: 20.0000 mg | ORAL_CAPSULE | Freq: Every day | ORAL | 3 refills | Status: DC

## 2016-05-12 NOTE — Progress Notes (Signed)
Reason for visit: Foot discomfort  Referring physician: Dr. Rance Muir is a 83 y.o. female  History of present illness:  Vanessa [name] is a 83 year old right-handed white female with a history of positional vertigo, seen through this office previously. Vanessa [name] comes in today with a new problem. Over Vanessa last 3 months or so she has developed some sensations of coldness of Vanessa hands and feet. Vanessa [name] will occasionally have sharp lancinating pains in Vanessa feet and pins and needles paresthesias. Vanessa symptoms are worse in Vanessa evening hours when she is trying to get rest, she does not notice Vanessa symptoms as much during Vanessa day when she is active. Vanessa [name] does have gait instability, she uses a cane for ambulation, she has not had any recent falls. She does have some chronic neck and shoulder discomfort and some occasional low back pain, she has had prior lumbosacral spine surgery. Vanessa [name] does have some constipation issues, she denies any problems with bladder control. She does not note any true weakness of Vanessa extremities. She has been given a trial on gabapentin, Vanessa dose is unknown. She was taken off of Vanessa gabapentin because it did not help, not because of intolerance. She is sent to this office for an evaluation.  Past Medical History:  Diagnosis Date  . Abdominal bloating   . Atrial fibrillation ([place])   . Bruises easily   . Carotid artery occlusion    LEFT  . Dizziness   . DOE (dyspnea on exertion) 04/02/2014  . Fall at home Sept 2013, Dec. 2013  Jun 08, 2012  . GERD (gastroesophageal reflux disease) 02/11/2015  . Headache(784.0)   . Hoarseness   . Hypercholesterolemia   . Hyperglycemia 04/02/2014   Glucose 178 mg percent on 01/22/2014 04/05/14 Hgb A1c 6.6 Diet controlled.    . Hypothyroidism 04/15/2015  . Neuropathy    PERIPHERAL  . Pruritus   . Scoliosis   . Varicose veins     Past Surgical History:  Procedure Laterality Date  . ABDOMINAL HYSTERECTOMY   1954  . CHOLECYSTECTOMY  1997  . SPINE SURGERY  1997   correct scoliosis    Family History  Problem Relation Age of Onset  . Adopted: Yes  . Diabetes Mother   . Heart attack Mother   . Heart disease Mother     After age 76  . Heart attack Father   . Stroke Father   . Breast cancer Sister   . Heart disease Maternal Grandmother   . Cancer Son     adopted son. esophagus, stomach, liver  . Stomach cancer Neg Hx   . Colon cancer Neg Hx     Social history:  reports that she has never smoked. She has never used smokeless tobacco. She reports that she does not drink alcohol or use drugs.  Medications:  Prior to Admission medications   Medication Sig Start Date End Date Taking? Authorizing Provider  acetaminophen (TYLENOL) 500 MG tablet Take 500 mg by mouth. Take one tablet every 6 hours as needed   Yes Historical Provider, MD  ALPRAZolam (XANAX) 0.25 MG tablet Take 0.25 mg by mouth. Take one tablet twice a day as needed for anxiety   Yes Historical Provider, MD  aspirin 81 MG tablet Take 81 mg by mouth daily.   Yes Historical Provider, MD  atenolol (TENORMIN) 25 MG tablet TAKE 1 TABLET TWICE DAILY. 03/29/16  Yes Pixie Casino, MD  Multiple Vitamins-Calcium (VIACTIV  MULTI-VITAMIN PO) Take 1 tablet by mouth daily.   Yes Historical Provider, MD  ranitidine (ZANTAC) 150 MG tablet Take 150 mg by mouth. Take one tablet at at breakfast and one at bedtime   Yes Historical Provider, MD  simvastatin (ZOCOR) 40 MG tablet TAKE (1/2) TABLET DAILY. 11/11/15  Yes Estill Dooms, MD     No Known Allergies  ROS:  Out of a complete 14 system review of symptoms, Vanessa [name] complains only of Vanessa following symptoms, and all other reviewed systems are negative.  Hearing loss, difficulty swallowing Eye discharge Abdominal pain, constipation Restless legs Rash, moles Dizziness, numbness  Blood pressure (!) 145/60, pulse (!) 56, height 5' (1.524 m), weight 101 lb 8 oz (46 kg).  Physical  Exam  General: Vanessa [name] is alert and cooperative at Vanessa time of Vanessa examination.  Eyes: Pupils are equal, round, and reactive to light. Discs are flat bilaterally.  Neck: Vanessa neck is supple, no carotid bruits are noted.  Respiratory: Vanessa respiratory examination is clear.  Cardiovascular: Vanessa cardiovascular examination reveals a regular rate and rhythm, no obvious murmurs or rubs are noted.  Skin: Extremities are without significant edema.  Neurologic Exam  Mental status: Vanessa [name] is alert and oriented x 3 at Vanessa time of Vanessa examination. Vanessa [name] has apparent normal recent and remote memory, with an apparently normal attention span and concentration ability.  Cranial nerves: Facial symmetry is present. There is good sensation of Vanessa face to pinprick and soft touch bilaterally. Vanessa strength of Vanessa facial muscles and Vanessa muscles to head turning and shoulder shrug are normal bilaterally. Speech is well enunciated, no aphasia or dysarthria is noted. Extraocular movements are full. Visual fields are full. Vanessa tongue is midline, and Vanessa [name] has symmetric elevation of Vanessa soft palate. No obvious hearing deficits are noted.  Motor: Vanessa motor testing reveals 5 over 5 strength of all 4 extremities. Good symmetric motor tone is noted throughout.  Sensory: Sensory testing is intact to pinprick, soft touch, vibration sensation, and position sense on all 4 extremities, but Vanessa [name] does have a stocking pattern pinprick sensory deficit one half way up Vanessa legs bilaterally. No evidence of extinction is noted.  Coordination: Cerebellar testing reveals good finger-nose-finger and heel-to-shin bilaterally.  Gait and station: Gait is slightly wide-based, unsteady. Vanessa [name] uses a cane for ambulation. Tandem gait was not attempted. Romberg is unsteady, Vanessa [name] has a tendency to fall. No drift is seen.  Reflexes: Deep tendon reflexes are symmetric, but are depressed bilaterally. Toes are  downgoing bilaterally.   Assessment/Plan:  1. Probable peripheral neuropathy  2. Gait instability  3. History of positional vertigo  Vanessa [name] has developed symptoms consistent with a peripheral neuropathy. Vanessa [name] reports symptoms that are worse in Vanessa evening hours. She will be given a trial on low-dose Cymbalta, she will contact our office for any dose adjustments. Blood work will be done today looking for treatable causes of peripheral neuropathy. She will follow-up in 4 months.  Jill Alexanders MD 05/12/2016 12:23 PM  [facility] [address] [place], [place] [address]  Phone [phone] Fax [phone]

## 2016-05-12 NOTE — Patient Instructions (Signed)
   We will start Cymbalta for the peripheral neuropathy symptoms.

## 2016-05-13 ENCOUNTER — Other Ambulatory Visit: Payer: Self-pay | Admitting: Internal Medicine

## 2016-05-17 ENCOUNTER — Telehealth: Payer: Self-pay | Admitting: Neurology

## 2016-05-17 NOTE — Telephone Encounter (Signed)
Patient called office in reference to DULoxetine (CYMBALTA) 20 MG capsule and a couple other questions.  Per patient she would like to speak with a nurse directly did not give details.  Please call

## 2016-05-17 NOTE — Telephone Encounter (Signed)
Pt called back, asking that you call her in the morning before 9:45 in the morning

## 2016-05-17 NOTE — Telephone Encounter (Signed)
Called and spoke with patient. She stated she is very sensitive to medications and she was reading about the side effects of the cymbalta. She is concerned after reading them. Since she is also  being treated by gastroenterologist, she wanted to wait until her stomach problems cleared up before starting the medication. She is afraid it will exacerbate her stomach sx. She did pick up the medication but is going to hold off on starting it. She wanted Dr Jannifer Franklin to be aware. Advised I will give him the message. She verbalized understanding.

## 2016-05-17 NOTE — Telephone Encounter (Signed)
Called and spoke with patient. She was driving and will call back.

## 2016-05-18 ENCOUNTER — Encounter: Payer: Self-pay | Admitting: Nurse Practitioner

## 2016-05-18 ENCOUNTER — Telehealth: Payer: Self-pay | Admitting: Neurology

## 2016-05-18 ENCOUNTER — Non-Acute Institutional Stay: Payer: Medicare Other | Admitting: Nurse Practitioner

## 2016-05-18 DIAGNOSIS — R1314 Dysphagia, pharyngoesophageal phase: Secondary | ICD-10-CM | POA: Diagnosis not present

## 2016-05-18 DIAGNOSIS — E039 Hypothyroidism, unspecified: Secondary | ICD-10-CM

## 2016-05-18 DIAGNOSIS — M150 Primary generalized (osteo)arthritis: Secondary | ICD-10-CM | POA: Diagnosis not present

## 2016-05-18 DIAGNOSIS — F411 Generalized anxiety disorder: Secondary | ICD-10-CM | POA: Diagnosis not present

## 2016-05-18 DIAGNOSIS — K5900 Constipation, unspecified: Secondary | ICD-10-CM | POA: Diagnosis not present

## 2016-05-18 DIAGNOSIS — M8949 Other hypertrophic osteoarthropathy, multiple sites: Secondary | ICD-10-CM

## 2016-05-18 DIAGNOSIS — G629 Polyneuropathy, unspecified: Secondary | ICD-10-CM | POA: Diagnosis not present

## 2016-05-18 DIAGNOSIS — I4891 Unspecified atrial fibrillation: Secondary | ICD-10-CM

## 2016-05-18 DIAGNOSIS — M159 Polyosteoarthritis, unspecified: Secondary | ICD-10-CM

## 2016-05-18 LAB — MULTIPLE MYELOMA PANEL, SERUM
Albumin SerPl Elph-Mcnc: 3.6 g/dL (ref 2.9–4.4)
Albumin/Glob SerPl: 1.3 (ref 0.7–1.7)
Alpha 1: 0.2 g/dL (ref 0.0–0.4)
Alpha2 Glob SerPl Elph-Mcnc: 0.7 g/dL (ref 0.4–1.0)
B-Globulin SerPl Elph-Mcnc: 1 g/dL (ref 0.7–1.3)
Gamma Glob SerPl Elph-Mcnc: 0.9 g/dL (ref 0.4–1.8)
Globulin, Total: 2.9 g/dL (ref 2.2–3.9)
IgA/Immunoglobulin A, Serum: 188 mg/dL (ref 64–422)
IgG (Immunoglobin G), Serum: 932 mg/dL (ref 700–1600)
IgM (Immunoglobulin M), Srm: 87 mg/dL (ref 26–217)
Total Protein: 6.5 g/dL (ref 6.0–8.5)

## 2016-05-18 LAB — B. BURGDORFI ANTIBODIES: Lyme IgG/IgM Ab: <0.91 {ISR} (ref 0.00–0.90)

## 2016-05-18 LAB — ENA+DNA/DS+SJORGEN'S
ENA RNP Ab: <0.2 AI (ref 0.0–0.9)
ENA SM Ab Ser-aCnc: <0.2 AI (ref 0.0–0.9)
ENA SSA (RO) Ab: <0.2 AI (ref 0.0–0.9)
ENA SSB (LA) Ab: <0.2 AI (ref 0.0–0.9)
dsDNA Ab: 17 IU/mL — ABNORMAL HIGH (ref 0–9)

## 2016-05-18 LAB — VITAMIN B12: Vitamin B-12: 619 pg/mL (ref 232–1245)

## 2016-05-18 LAB — ANA W/REFLEX: Anti Nuclear Antibody(ANA): POSITIVE — AB

## 2016-05-18 NOTE — Assessment & Plan Note (Signed)
Seems better, continue Cymbalta 20mg  qhs and Alprazolam.

## 2016-05-18 NOTE — Assessment & Plan Note (Addendum)
Chronic, pain, tingling, pricking,  feel cold BLE at night, off Gabapentin 100mg  nightly, Tylenol 500mg  q6h prn. Dr. Jannifer Franklin suggested Cymbalta 20mg  daily, the patient is debating R vs B

## 2016-05-18 NOTE — Assessment & Plan Note (Signed)
Occasional heartburn symptoms, continue Ranitidine 150mg  bid, FDGard II bid, GI 12/01/15

## 2016-05-18 NOTE — Assessment & Plan Note (Signed)
Heart rate is in control, continue Atenolol 25mg  bid.

## 2016-05-18 NOTE — Progress Notes (Signed)
Location:  [facility] Room Number: 01 Place of Service:  ALF [id number]) Provider:  Mast, Manxie  NP  Estill Dooms, MD  Patient Care Team: Estill Dooms, MD as PCP - General (Internal Medicine) Angelia Mould, MD as Attending Physician (Vascular Surgery) [name], MD as Attending Physician (Obstetrics and Gynecology) [name], MD (Orthopedic Surgery) [name], MD (Ophthalmology) [name], MD as Consulting Physician (Urology) [name], MD as Consulting Physician (Cardiology) [name], PA-C as Physician Assistant (Otolaryngology) [name], MD as Consulting Physician (Otolaryngology) [name], MD as Consulting Physician (Dermatology) [name], MD as Consulting Physician (Physical Medicine and Rehabilitation) [name], MD as Attending Physician (Gastroenterology) Mast, Man X, NP as Nurse Practitioner (Internal Medicine) Kathrynn Ducking, MD as Consulting Physician (Neurology) Debara Pickett Nadean Corwin, MD as Consulting Physician (Cardiology) Druscilla Brownie, MD as Consulting Physician (Dermatology)  Extended Emergency Contact Information Primary Emergency Contact: [place] Phone: [phone] Relation: Other Secondary Emergency Contact: Larina Bras [place] of [place] Phone: [phone] Mobile Phone: [phone] Relation: Son  Code Status:  Full Code Goals of care: Advanced Directive information Advanced Directives 05/18/2016  Does Patient Have a Medical Advance Directive? Yes  Type of Paramedic of [place];Living will  Does patient want to make changes to medical advance directive? No - Patient declined  Copy of [place] in Chart? Yes  Would patient like information on creating a medical advance directive? -     Chief Complaint  Patient presents with  . Medical Management of Chronic Issues     HPI:  Pt is a 83 y.o. Vanessa [name] seen today for medical management of chronic diseases.     Past Medical History:  Diagnosis Date  . Abdominal bloating   . Atrial fibrillation ([place])   . Bruises easily   . Carotid artery occlusion    LEFT  . Dizziness   . DOE (dyspnea on exertion) 04/02/2014  . Fall at home Sept 2013, Dec. 2013  Jun 08, 2012  . GERD (gastroesophageal reflux disease) 02/11/2015  . Headache(784.0)   . Hoarseness   . Hypercholesterolemia   . Hyperglycemia 04/02/2014   Glucose 178 mg percent on 01/22/2014 04/05/14 Hgb A1c 6.6 Diet controlled.    . Hypothyroidism 04/15/2015  . Neuropathy    PERIPHERAL  . Pruritus   . Scoliosis   . Varicose veins    Past Surgical History:  Procedure Laterality Date  . ABDOMINAL HYSTERECTOMY  1954  . CHOLECYSTECTOMY  1997  . SPINE SURGERY  1997   correct scoliosis    No Known Allergies  Outpatient Encounter Prescriptions as of 05/18/2016  Medication Sig  . acetaminophen (TYLENOL) 500 MG tablet Take 500 mg by mouth. Take one tablet every 6 hours as needed  . ALPRAZolam (XANAX) 0.25 MG tablet Take 0.25 mg by mouth. Take one tablet twice a day as needed for anxiety  . aspirin 81 MG tablet Take 81 mg by mouth daily.  Marland Kitchen atenolol (TENORMIN) 25 MG tablet TAKE 1 TABLET TWICE DAILY.  Marland Kitchen docusate sodium (COLACE) 100 MG capsule Take 100 mg by mouth 2 (two) times daily.  . Multiple Vitamins-Calcium (VIACTIV MULTI-VITAMIN PO) Take 1 tablet by mouth daily.  . psyllium (METAMUCIL) 58.6 % packet Take 1 packet by mouth daily.  . ranitidine (ZANTAC) 150 MG tablet Take 150 mg by mouth. Take one tablet at at breakfast and one at bedtime  . simvastatin (ZOCOR)  40 MG tablet TAKE (1/2) TABLET DAILY.  . [DISCONTINUED] DULoxetine (CYMBALTA) 20 MG capsule Take 1 capsule (20 mg total) by mouth at bedtime.   No facility-administered encounter medications on file as of 05/18/2016.     Review of Systems  Immunization History  Administered Date(s)  Administered  . Influenza Split 10/20/2011, 11/03/2012  . Influenza,inj,Quad PF,36+ Mos 09/29/2012, 11/09/2013  . Influenza-Unspecified 10/17/2014, 10/23/2015  . PPD Test 08/21/2013, 03/04/2014  . Pneumococcal Conjugate-13 08/21/2013  . Tdap 05/20/2008   Pertinent  Health Maintenance Due  Topic Date Due  . DEXA SCAN  01/31/1987  . PNA vac Low Risk Adult (2 of 2 - PPSV23) 08/22/2014  . INFLUENZA VACCINE  08/11/2016   Fall Risk  09/02/2015 04/29/2015 04/01/2015 02/11/2015 11/05/2014  Falls in the past year? No No No No No  Number falls in past yr: - - - - -  Injury with Fall? - - - - -  Risk Factor Category  - - - - -  Risk for fall due to : - - - - -  Risk for fall due to (comments): - - - - -   Functional Status Survey:    Vitals:   05/18/16 1452  BP: 106/63  Pulse: Vanessa  Resp: 18  Temp: 98.1 F (36.7 C)  SpO2: 95%  Weight: 100 lb 12.8 oz (45.7 kg)  Height: 5' (1.524 m)   Body mass index is 19.69 kg/m. Physical Exam  Labs reviewed:  Recent Labs  07/07/15 11/06/15  NA 139 138  K 4.7 4.5  BUN 14 16  CREATININE 0.9 0.8    Recent Labs  11/06/15 05/12/16 1304  AST 24  --   ALT 10  --   ALKPHOS 99  --   PROT  --  WILL FOLLOW    Recent Labs  11/06/15  WBC 6.9  HGB 12.1  HCT 37  PLT 270   Lab Results  Component Value Date   TSH 3.08 11/06/2015   Lab Results  Component Value Date   HGBA1C 6.4 07/07/2015   Lab Results  Component Value Date   CHOL 148 07/07/2015   HDL Vanessa 07/07/2015   LDLCALC 58 07/07/2015   TRIG 100 07/07/2015   CHOLHDL 3 07/09/2013    Significant Diagnostic Results in last 30 days:  No results found.  Assessment/Plan 1. Atrial fibrillation, unspecified type ([place])   2. Dysphagia, pharyngoesophageal phase   3. Constipation, unspecified constipation type   4. Hypothyroidism, unspecified type   5. Neuropathy   6. Anxiety state    Family/ staff Communication:    Labs/tests ordered:

## 2016-05-18 NOTE — Telephone Encounter (Signed)
I called patient. The blood work was unremarkable with exception of a positive ANA, there is minor elevation of the double stranded DNA on the panel, this is not likely to be clinically significant. I discussed this with the patient.  The patient was given a prescription for Cymbalta, she does not with to take it for fear of side effects. If her symptoms worsen with discomfort in the feet, she may rethink this position.

## 2016-05-18 NOTE — Assessment & Plan Note (Signed)
Stable, continue Colace, Metamucil. Observe.

## 2016-05-18 NOTE — Progress Notes (Signed)
Location:  [facility] Room Number: 01 Place of Service:  ALF [phone]) Provider:  Standley Bargo, Manxie  NP  Estill Dooms, MD  Patient Care Team: Estill Dooms, MD as PCP - General (Internal Medicine) Angelia Mould, MD as Attending Physician (Vascular Surgery) [name], MD as Attending Physician (Obstetrics and Gynecology) [name], MD (Orthopedic Surgery) [name], MD (Ophthalmology) [name], MD as Consulting Physician (Urology) [name], MD as Consulting Physician (Cardiology) [name], PA-C as Physician Assistant (Otolaryngology) [name], MD as Consulting Physician (Otolaryngology) [name], MD as Consulting Physician (Dermatology) [name], MD as Consulting Physician (Physical Medicine and Rehabilitation) [name], MD as Attending Physician (Gastroenterology) Lydia Meng X, NP as Nurse Practitioner (Internal Medicine) Kathrynn Ducking, MD as Consulting Physician (Neurology) Debara Pickett Nadean Corwin, MD as Consulting Physician (Cardiology) Druscilla Brownie, MD as Consulting Physician (Dermatology)  Extended Emergency Contact Information Primary Emergency Contact: [place] Phone: [phone] Relation: Other Secondary Emergency Contact: Larina Bras [place] of [place] Phone: [phone] Mobile Phone: [phone] Relation: Son  Code Status:  Full Code Goals of care: Advanced Directive information Advanced Directives 05/18/2016  Does Patient Have a Medical Advance Directive? Yes  Type of Paramedic of [place];Living will  Does patient want to make changes to medical advance directive? No - Patient declined  Copy of [place] in Chart? Yes  Would patient like information on creating a medical advance directive? -     Chief Complaint  Patient presents with  . Medical Management of Chronic Issues     HPI:  Pt is a 83 y.o. female seen today for medical management of chronic diseases.      Hx of Peripheral neuropathy, off Gabapentin 100mg  nightly, not working, mainly pain and cool feeling in BLE at night, f/u Neurology,  GERD has been stable except occasional heartburn symptoms while on Ranitidine 150mg  bid, DF Gard II bid, f/u GI, not constipation while on Colace and Metamucil, sleeps and rest at night with help of Alprazolam prn. Hx of Afib, heart rate is in control, taking Atenolol 25mg  bid. Chronic lower back pain-f/u Ortho, mainly in SIJ R+L, had a few inj, pending left SIJ inj. Taking Cymbalta 20mg  qhs   Past Medical History:  Diagnosis Date  . Abdominal bloating   . Atrial fibrillation ([place])   . Bruises easily   . Carotid artery occlusion    LEFT  . Dizziness   . DOE (dyspnea on exertion) 04/02/2014  . Fall at home Sept 2013, Dec. 2013  Jun 08, 2012  . GERD (gastroesophageal reflux disease) 02/11/2015  . Headache(784.0)   . Hoarseness   . Hypercholesterolemia   . Hyperglycemia 04/02/2014   Glucose 178 mg percent on 01/22/2014 04/05/14 Hgb A1c 6.6 Diet controlled.    . Hypothyroidism 04/15/2015  . Neuropathy    PERIPHERAL  . Pruritus   . Scoliosis   . Varicose veins    Past Surgical History:  Procedure Laterality Date  . ABDOMINAL HYSTERECTOMY  1954  . CHOLECYSTECTOMY  1997  . SPINE SURGERY  1997   correct scoliosis    No Known Allergies  Allergies as of 05/18/2016   No Known Allergies     Medication List       Accurate as of 05/18/16 11:59 PM. Always use your most recent med list.          acetaminophen 500 MG tablet Commonly known as:  TYLENOL Take 500 mg by mouth. Take one tablet every 6 hours as needed   ALPRAZolam 0.25 MG tablet Commonly known as:  XANAX Take 0.25 mg by mouth. Take one tablet twice a day as needed for anxiety   aspirin 81 MG tablet Take 81 mg by mouth daily.   atenolol 25 MG tablet Commonly known as:  TENORMIN TAKE 1 TABLET  TWICE DAILY.   docusate sodium 100 MG capsule Commonly known as:  COLACE Take 100 mg by mouth 2 (two) times daily.   psyllium 58.6 % packet Commonly known as:  METAMUCIL Take 1 packet by mouth daily.   ranitidine 150 MG tablet Commonly known as:  ZANTAC Take 150 mg by mouth. Take one tablet at at breakfast and one at bedtime   simvastatin 40 MG tablet Commonly known as:  ZOCOR TAKE (1/2) TABLET DAILY.   VIACTIV MULTI-VITAMIN PO Take 1 tablet by mouth daily.       Review of Systems  Constitutional: Negative for chills, diaphoresis and fever.  HENT: Positive for hearing loss. Negative for congestion, ear discharge, ear pain, sore throat and tinnitus.        Chronic sinus congestion. Dentures. Xerostomia.  Eyes: Negative for pain and redness.  Respiratory: Positive for shortness of breath (On exertion). Negative for cough and wheezing.   Cardiovascular: Positive for palpitations. Negative for chest pain and leg swelling.       Hx AF  Gastrointestinal: Negative for abdominal pain, constipation, diarrhea and nausea.       HX GI bleed In Sept 2015. Difficulty swallowing solids. Avoids beef. Barium swallow 08/09/14 showed tight cricopharyngeus muscle, GERD, and mild esophageal dysmotility.  Endocrine: Negative for polydipsia.  Genitourinary: Positive for frequency. Negative for dysuria, flank pain, hematuria and urgency.  Musculoskeletal: Positive for back pain. Negative for myalgias and neck pain.       Hx scoliosis and surgery of the lower back to partially correct it. Medial right knee pain  Skin: Negative for rash.       Chronic pruritus.  Has multiple lesions of the back including irritated seborrheic keratoses, atrophic plaques, multiple angiomas. Sebaceous cyst of the dorsal aspect of the left forearm. BLE varicose veins  Neurological: Positive for dizziness. Negative for tremors, seizures, weakness and headaches.       History of neuropathy with some numbness in the  feet.  Hematological: Does not bruise/bleed easily.       Anemia resolved in May 2016  Psychiatric/Behavioral: Negative for hallucinations and suicidal ideas. The patient is nervous/anxious.     Immunization History  Administered Date(s) Administered  . Influenza Split 10/20/2011, 11/03/2012  . Influenza,inj,Quad PF,36+ Mos 09/29/2012, 11/09/2013  . Influenza-Unspecified 10/17/2014, 10/23/2015  . PPD Test 08/21/2013, 03/04/2014  . Pneumococcal Conjugate-13 08/21/2013  . Tdap 05/20/2008   Pertinent  Health Maintenance Due  Topic Date Due  . DEXA SCAN  01/31/1987  . PNA vac Low Risk Adult (2 of 2 - PPSV23) 08/22/2014  . INFLUENZA VACCINE  08/11/2016   Fall Risk  09/02/2015 04/29/2015 04/01/2015 02/11/2015 11/05/2014  Falls in the past year? No No No No No  Number falls in past yr: - - - - -  Injury with Fall? - - - - -  Risk Factor Category  - - - - -  Risk for fall due to : - - - - -  Risk for fall due to (comments): - - - - -   Functional Status Survey:    Vitals:  05/18/16 1452  BP: 106/63  Pulse: 70  Resp: 18  Temp: 98.1 F (36.7 C)  SpO2: 95%  Weight: 100 lb 12.8 oz (45.7 kg)  Height: 5' (1.524 m)   Body mass index is 19.69 kg/m. Physical Exam  Constitutional: She is oriented to person, place, and time. She appears well-developed and well-nourished. No distress.  Thin, frail.  HENT:  Right Ear: External ear normal.  Left Ear: External ear normal.  Nose: Nose normal.  Mouth/Throat: Oropharynx is clear and moist. No oropharyngeal exudate.  Eyes: Conjunctivae and EOM are normal. Pupils are equal, round, and reactive to light. No scleral icterus.  Neck: No JVD present. No tracheal deviation present. No thyromegaly present.  Cardiovascular: Normal rate, normal heart sounds and intact distal pulses.  Exam reveals no gallop and no friction rub.   No murmur heard. Atrial fibrillation. Controlled rate. Varicose veins in both legs.  Pulmonary/Chest: Effort normal. No  respiratory distress. She has no wheezes. She has no rales. She exhibits no tenderness.  Hoarse  Abdominal: She exhibits no distension and no mass. There is no tenderness.  Musculoskeletal: Normal range of motion. She exhibits no edema or tenderness.  Scar in lumbar area secondary prior surgery. Some difficulty lying down and getting up from the exam table.  Lymphadenopathy:    She has no cervical adenopathy.  Neurological: She is alert and oriented to person, place, and time. No cranial nerve deficit. Coordination normal.  09/02/2015 MMSE 29/30. Passed clock drawing..  Skin: No rash noted. She is not diaphoretic. No erythema. No pallor.  Multiple seborrheic keratoses the temple and on the back. Varicose veins.    Psychiatric: She has a normal mood and affect. Her behavior is normal. Judgment and thought content normal.    Labs reviewed:  Recent Labs  07/07/15 11/06/15  NA 139 138  K 4.7 4.5  BUN 14 16  CREATININE 0.9 0.8    Recent Labs  11/06/15 05/12/16 1304  AST 24  --   ALT 10  --   ALKPHOS 99  --   PROT  --  6.5    Recent Labs  11/06/15  WBC 6.9  HGB 12.1  HCT 37  PLT 270   Lab Results  Component Value Date   TSH 3.08 11/06/2015   Lab Results  Component Value Date   HGBA1C 6.4 07/07/2015   Lab Results  Component Value Date   CHOL 148 07/07/2015   HDL 70 07/07/2015   LDLCALC 58 07/07/2015   TRIG 100 07/07/2015   CHOLHDL 3 07/09/2013    Significant Diagnostic Results in last 30 days:  No results found.  Assessment/Plan Atrial fibrillation (HCC) Heart rate is in control, continue Atenolol 25mg  bid.  Dysphagia, pharyngoesophageal phase Occasional heartburn symptoms, continue Ranitidine 150mg  bid, FDGard II bid, GI 12/01/15    Constipation Stable, continue Colace, Metamucil. Observe.   Hypothyroidism TSH 3.08 11/06/15, not on med  Neuropathy Chronic, pain, tingling, pricking,  feel cold BLE at night, off Gabapentin 100mg  nightly, Tylenol  500mg  q6h prn. Dr. Jannifer Franklin suggested Cymbalta 20mg  daily, the patient is debating R vs B  Anxiety state Seems better, continue Cymbalta 20mg  qhs and Alprazolam.   Osteoarthritis F/u Ortho routinely, pending back inj    Family/ staff Communication: AL  Labs/tests ordered:  none

## 2016-05-18 NOTE — Assessment & Plan Note (Signed)
TSH 3.08 11/06/15, not on med

## 2016-05-21 ENCOUNTER — Ambulatory Visit: Payer: Medicare Other | Admitting: Gastroenterology

## 2016-05-25 ENCOUNTER — Encounter: Payer: Self-pay | Admitting: Internal Medicine

## 2016-05-25 ENCOUNTER — Telehealth: Payer: Self-pay | Admitting: Gastroenterology

## 2016-05-25 NOTE — Telephone Encounter (Signed)
Letter faxed to the fax number provided for OTC FDgard

## 2016-05-28 NOTE — Assessment & Plan Note (Signed)
F/u Ortho routinely, pending back inj

## 2016-05-31 ENCOUNTER — Ambulatory Visit (INDEPENDENT_AMBULATORY_CARE_PROVIDER_SITE_OTHER): Payer: Medicare Other | Admitting: Family Medicine

## 2016-05-31 VITALS — BP 103/66 | HR 83 | Temp 98.5°F | Resp 16 | Ht 60.0 in | Wt 98.6 lb

## 2016-05-31 DIAGNOSIS — I6523 Occlusion and stenosis of bilateral carotid arteries: Secondary | ICD-10-CM

## 2016-05-31 DIAGNOSIS — K5900 Constipation, unspecified: Secondary | ICD-10-CM | POA: Diagnosis not present

## 2016-05-31 DIAGNOSIS — K5641 Fecal impaction: Secondary | ICD-10-CM

## 2016-05-31 NOTE — Patient Instructions (Addendum)
Unfortunately we were not able to remove the entire fecal impaction in the office today. I did remove a small amount of stool, but there is still a large amount of stool present that is beyond the reach of our exam.   Try fleet glycerin suppository tonight (ask pharmacist as this is over the counter), MiraLAX one to 2 times per day to help promote bowel movement. Warm water enema would be safest for now if that can be done through nurse at [facility], or this may need to be done through the hospital tomorrow if you are unable to pass a bowel movement.  If unable to pass bowel movement with that approach, may also need to have sigmoidoscopy with gastroenterologist to remove stool. If nausea/vomiting, increasing abdominal pain, or increasing abdominal swelling, go to the emergency room for further evaluation and treatment.   Fecal Impaction A fecal impaction is a large, firm amount of stool (feces) that will not pass out of the body. A fecal impaction usually occurs in the end of the large intestine (rectum). It can block the large intestine and cause significant problems. What are the causes? This condition may be caused by anything that slows down bowel movements, including:  Long-term use of medicines that help you have a bowel movement (laxatives).  Constipation.  Pain in the rectum. Fecal impaction can occur if you avoid having bowel movements due to the pain. Pain in the rectum can result from a medical condition, such as hemorrhoids or anal fissures.  Narcotic pain-relieving medicines, such as methadone, morphine, or codeine.  Not drinking enough fluids.  Being inactive for a long period of time.  Diseases of the brain or nervous system that damage nerves that control the muscles of the intestines. What are the signs or symptoms? Symptoms of this condition include:  Breathing problems.  Nausea, vomiting, and dehydration.  Dizziness.  Confusion.  Rapid  heartbeat.  Fever.  Sweating.  Changes in blood pressure.  Not having a normal number of bowel movements.  Changes in bowel patterns. This may include going to the bathroom less often or not at all.  A sense of fullness in the rectum but being unable to pass stool.  Pain or cramps in the abdominal area. These often happen after meals.  Thin, watery discharge from the rectum. How is this diagnosed? This condition may be diagnosed based on your symptoms and an exam of your rectum. Sometimes X-rays or lab tests are done to confirm the diagnosis and to check for other problems. How is this treated? This condition may be treated by:  Having your health care provider remove the stool using a gloved finger.  Taking medicine.  A suppository or enema given in the rectum to soften the stool, which can stimulate a bowel movement. Follow these instructions at home: Eating and drinking   Drink enough fluid to keep your urine clear or pale yellow.  Include a lot of fiber in your diet. Foods with a lot of fiber include fruits, vegetables, and oatmeal.  If you begin to get constipated, increase the amount of fiber in your diet. General instructions   Develop bowel habits. An example of a bowel habit is having a bowel movement right after breakfast every day. Be sure to give yourself enough time on the toilet. This may require using enemas, bowel softeners, or suppositories at home, as directed by your health care provider. It may also include using mineral oil or olive oil.  Exercise regularly.  Take over-the-counter and prescription medicines only as told by your health care provider. Contact a health care provider if:  You have ongoing pain in your rectum.  You need to use an enema or a suppository more than 2 times a week.  You have rectal bleeding.  You continue to have problems. The problems may include not being able to go to the bathroom and long-term (chronic)  constipation.  You have pain in your abdomen.  You have thin, pencil-like stools. Get help right away if:  You have black or tarry stools. This information is not intended to replace advice given to you by your health care provider. Make sure you discuss any questions you have with your health care provider. Document Released: 09/20/2003 Document Revised: 08/01/2015 Document Reviewed: 07/03/2015 Elsevier Interactive Patient Education  2017 [employer].  IF you received an x-ray today, you will receive an invoice from [facility]. Please contact [facility] at [phone] with questions or concerns regarding your invoice.   IF you received labwork today, you will receive an invoice from [facility]. Please contact [facility] at [phone] with questions or concerns regarding your invoice.   Our billing staff will not be able to assist you with questions regarding bills from these companies.  You will be contacted with the lab results as soon as they are available. The fastest way to get your results is to activate your My Chart account. Instructions are located on the last page of this paperwork. If you have not heard from [place] regarding the results in 2 weeks, please contact this office.

## 2016-05-31 NOTE — Progress Notes (Signed)
Subjective:  By signing my name below, I, Moises Blood, attest that this documentation has been prepared under the direction and in the presence of Merri Ray, MD. Electronically Signed: Moises Blood, [place]. 05/31/2016 , 4:11 PM .  Patient was seen in Room 1 .   Patient ID: Vanessa [name], female    DOB: 10-01-1920, 83 y.o.   MRN: [id number] Chief Complaint  Patient presents with  . Constipation    Pt had a BM yesterday - today she has a hard ball of stool that she cannot pass    HPI Vanessa [name] is a 83 y.o. female Patient complains of possible constipation. She has multiple medical problems per problem list. She was seen by her GI, Dr. Ardis Hughs, on April 27th for bloating and lower abdominal discomfort, noted to have constipation at that time. She was recommended to stay hydrated and prune juice as needed; tapering off zantac and Gas-X slowly over time. Recheck in 3-4 months.   Patient states she had a normal stool yesterday. But today around noon, she had the urge to have a bowel movement. She only had small dry stools, and then felt a larger stool that she wasn't able to push through. She informed us that her GI, Dr. Ardis Hughs, recommended her to drink prune juice, so she notes taking 2oz prune juice last night. She's been taking it more recently at night to help have movements in the morning. She hasn't taken colace and miralax in several. She hasn't felt constipation in the past few days.   She did try to have another bowel movement after in the noon today with vaseline. She wasn't able to get the hard stool out, but only small dry amounts again. She also mentions discomfort at her rectum. She denies abdominal pain.   She lives at [facility], assisted living facility.   Patient Active Problem List   Diagnosis Date Noted  . Paresthesia 05/12/2016  . Abnormality of gait 04/19/2016  . Urinary frequency 12/09/2015  . OCD (obsessive compulsive disorder) 09/02/2015  .  Chest pain 06/12/2015  . Hypothyroidism 04/15/2015  . Suprapubic pain 04/01/2015  . GERD (gastroesophageal reflux disease) 02/11/2015  . Constipation 02/11/2015  . Aspiration into respiratory tract 08/27/2014  . Cricopharyngeal hypertrophy 08/27/2014  . Muscle tension dysphonia 08/27/2014  . 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 Low back pain 08/13/2014  . Carotid stenosis 08/13/2014  . Hoarse 08/13/2014  . Xerostomia 06/18/2014  . Prickling sensation-Left Leg 06/05/2014  . Tingling sensation-Left Leg 06/05/2014  . Swelling of ankle 06/05/2014  . Hyperglycemia 04/02/2014  . Loss of weight 04/02/2014  . DOE (dyspnea on exertion) 04/02/2014  . Idiopathic scoliosis 04/02/2014  . Hearing loss 04/02/2014  . Dysphagia, pharyngoesophageal phase 04/02/2014  . Seborrheic keratoses, inflamed 04/02/2014  . Palliative care encounter 09/12/2013  . Weakness generalized 09/12/2013  . Anxiety state 09/12/2013  . GI bleed 09/10/2013  . Other and unspecified ovarian cyst 09/10/2013  . Fall 09/03/2013  . PVD (peripheral vascular disease) ([place]) 02/21/2013  . Occlusion of left internal carotid artery 08/16/2012  . Varicose veins 12/22/2011  . Malaise and fatigue 10/06/2011  . Raynaud phenomenon 06/26/2010  . Osteoarthritis 06/26/2010  . Atrial fibrillation ([place])   . Neuropathy   . Hypercholesterolemia   . Headache   . Pruritus    Past Medical History:  Diagnosis Date  . Abdominal bloating   . Atrial fibrillation ([place])   . Bruises easily   . Carotid artery occlusion  LEFT  . Dizziness   . DOE (dyspnea on exertion) 04/02/2014  . Fall at home Sept 2013, Dec. 2013  Jun 08, 2012  . GERD (gastroesophageal reflux disease) 02/11/2015  . Headache(784.0)   . Hoarseness   . Hypercholesterolemia   . Hyperglycemia 04/02/2014   Glucose 178 mg percent on 01/22/2014 04/05/14 Hgb A1c 6.6 Diet controlled.    . Hypothyroidism 04/15/2015  . Neuropathy    PERIPHERAL  . Pruritus   . Scoliosis   .  Varicose veins    Past Surgical History:  Procedure Laterality Date  . ABDOMINAL HYSTERECTOMY  1954  . CHOLECYSTECTOMY  1997  . SPINE SURGERY  1997   correct scoliosis   No Known Allergies Prior to Admission medications   Medication Sig Start Date End Date Taking? Authorizing Provider  acetaminophen (TYLENOL) 500 MG tablet Take 500 mg by mouth. Take one tablet every 6 hours as needed    [provider]  ALPRAZolam (XANAX) 0.25 MG tablet Take 0.25 mg by mouth. Take one tablet twice a day as needed for anxiety    [provider]  aspirin 81 MG tablet Take 81 mg by mouth daily.    [provider]  atenolol (TENORMIN) 25 MG tablet TAKE 1 TABLET TWICE DAILY. 03/29/16   Hilty, Nadean Corwin, MD  docusate sodium (COLACE) 100 MG capsule Take 100 mg by mouth 2 (two) times daily.    [provider]  Multiple Vitamins-Calcium (VIACTIV MULTI-VITAMIN PO) Take 1 tablet by mouth daily.    [provider]  psyllium (METAMUCIL) 58.6 % packet Take 1 packet by mouth daily.    [provider]  ranitidine (ZANTAC) 150 MG tablet Take 150 mg by mouth. Take one tablet at at breakfast and one at bedtime    [provider]  simvastatin (ZOCOR) 40 MG tablet TAKE (1/2) TABLET DAILY. 05/13/16   Estill Dooms, MD   Social History   Social History  . Marital status: Widowed    Spouse name: N/A  . Number of children: 2  . Years of education: 12+   Occupational History  . 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 Smoking status: Never Smoker  . Smokeless tobacco: Never Used  . Alcohol use No  . Drug use: No  . Sexual activity: No   Other Topics Concern  . Not on file   Social History Narrative   Patient lives at [facility]2014   Patient is a widowed.    Patient is retired.    Patient has no children but adopted twin sons.    Patient has a college degree.    Never smoked     Alcohol none   Exercise with therapy   Walks with cane   POA       Patient only drinks de-caf drinks.   Patient is right handed.   Review of Systems  Constitutional: Negative for chills, fatigue, fever and unexpected weight change.  Respiratory: Negative for cough.   Gastrointestinal: Positive for constipation and rectal pain. Negative for abdominal pain, diarrhea, nausea and vomiting.  Skin: Negative for rash and wound.  Neurological: Negative for dizziness, weakness and headaches.       Objective:   Physical Exam  Constitutional: She is oriented to person, place, and time. She appears well-developed and well-nourished. No distress.  HENT:  Head: Normocephalic and atraumatic.  Eyes: EOM are normal. Pupils are equal, round,  and reactive to light.  Neck: Neck supple.  Cardiovascular: Normal rate.   Pulmonary/Chest: Effort normal. No respiratory distress.  Genitourinary:  Genitourinary Comments: There is large hard stool burden in the rectum; attempted disimpaction/breaking up stool, but difficulty with fragmentation Multiple attempts at disimpaction with secondary MD, the impaction appears to be too far superior in rectal vault, unable to break with digital exam/fragmentation  Musculoskeletal: Normal range of motion.  Neurological: She is alert and oriented to person, place, and time.  Skin: Skin is warm and dry.  Psychiatric: She has a normal mood and affect. Her behavior is normal.  Nursing note and vitals reviewed.   Vitals:   05/31/16 1525  BP: 103/66  Pulse: 83  Resp: 16  Temp: 98.5 F (36.9 C)  TempSrc: Oral  SpO2: 97%  Weight: 98 lb 9.6 oz (44.7 kg)  Height: 5' (1.524 m)      Assessment & Plan:    Vanessa [name] is a 83 y.o. female Constipation, unspecified constipation type  Fecal impaction (HCC)  - hx of bloating, constipation, now with fecal impaction. Multiple attempts at fragmentation with only small amount of stool withdrawn.   - will try  glycerin suppository, miralax QD to BID.   - warm water enemas may be ok temporarily, may need repeat attempt at disimpaction or possible sigmoidoscopy for disimpaction with proximal impaction.   Meds ordered this encounter  Medications  . OVER THE COUNTER MEDICATION  . DULoxetine (CYMBALTA) 20 MG capsule    Sig: Take 20 mg by mouth daily.   Patient Instructions    Unfortunately we were not able to remove the entire fecal impaction in the office today. I did remove a small amount of stool, but there is still a large amount of stool present that is beyond the reach of our exam.   Try fleet glycerin suppository tonight (ask pharmacist as this is over the counter), MiraLAX one to 2 times per day to help promote bowel movement. Warm water enema would be safest for now if that can be done through nurse at [facility], or this may need to be done through the hospital tomorrow if you are unable to pass a bowel movement.  If unable to pass bowel movement with that approach, may also need to have sigmoidoscopy with gastroenterologist to remove stool. If nausea/vomiting, increasing abdominal pain, or increasing abdominal swelling, go to the emergency room for further evaluation and treatment.   Fecal Impaction A fecal impaction is a large, firm amount of stool (feces) that will not pass out of the body. A fecal impaction usually occurs in the end of the large intestine (rectum). It can block the large intestine and cause significant problems. What are the causes? This condition may be caused by anything that slows down bowel movements, including:  Long-term use of medicines that help you have a bowel movement (laxatives).  Constipation.  Pain in the rectum. Fecal impaction can occur if you avoid having bowel movements due to the pain. Pain in the rectum can result from a medical condition, such as hemorrhoids or anal fissures.  Narcotic pain-relieving medicines, such as methadone, morphine, or  codeine.  Not drinking enough fluids.  Being inactive for a long period of time.  Diseases of the brain or nervous system that damage nerves that control the muscles of the intestines. What are the signs or symptoms? Symptoms of this condition include:  Breathing problems.  Nausea, vomiting, and dehydration.  Dizziness.  Confusion.  Rapid  heartbeat.  Fever.  Sweating.  Changes in blood pressure.  Not having a normal number of bowel movements.  Changes in bowel patterns. This may include going to the bathroom less often or not at all.  A sense of fullness in the rectum but being unable to pass stool.  Pain or cramps in the abdominal area. These often happen after meals.  Thin, watery discharge from the rectum. How is this diagnosed? This condition may be diagnosed based on your symptoms and an exam of your rectum. Sometimes X-rays or lab tests are done to confirm the diagnosis and to check for other problems. How is this treated? This condition may be treated by:  Having your health care provider remove the stool using a gloved finger.  Taking medicine.  A suppository or enema given in the rectum to soften the stool, which can stimulate a bowel movement. Follow these instructions at home: Eating and drinking   Drink enough fluid to keep your urine clear or pale yellow.  Include a lot of fiber in your diet. Foods with a lot of fiber include fruits, vegetables, and oatmeal.  If you begin to get constipated, increase the amount of fiber in your diet. General instructions   Develop bowel habits. An example of a bowel habit is having a bowel movement right after breakfast every day. Be sure to give yourself enough time on the toilet. This may require using enemas, bowel softeners, or suppositories at home, as directed by your health care provider. It may also include using mineral oil or olive oil.  Exercise regularly.  Take over-the-counter and prescription  medicines only as told by your health care provider. Contact a health care provider if:  You have ongoing pain in your rectum.  You need to use an enema or a suppository more than 2 times a week.  You have rectal bleeding.  You continue to have problems. The problems may include not being able to go to the bathroom and long-term (chronic) constipation.  You have pain in your abdomen.  You have thin, pencil-like stools. Get help right away if:  You have black or tarry stools. This information is not intended to replace advice given to you by your health care provider. Make sure you discuss any questions you have with your health care provider. Document Released: 09/20/2003 Document Revised: 08/01/2015 Document Reviewed: 07/03/2015 Elsevier Interactive Patient Education  2017 [employer].  IF you received an x-ray today, you will receive an invoice from [facility]. Please contact [facility] at [phone] with questions or concerns regarding your invoice.   IF you received labwork today, you will receive an invoice from [facility]. Please contact [facility] at [phone] with questions or concerns regarding your invoice.   Our billing staff will not be able to assist you with questions regarding bills from these companies.  You will be contacted with the lab results as soon as they are available. The fastest way to get your results is to activate your My Chart account. Instructions are located on the last page of this paperwork. If you have not heard from [place] regarding the results in 2 weeks, please contact this office.       I personally performed the services described in this documentation, which was scribed in my presence. The recorded information has been reviewed and considered for accuracy and completeness, addended by me as needed, and agree with information above.  Signed,   Merri Ray, MD Primary Care at [facility].    06/01/16 8:42 AM

## 2016-06-01 ENCOUNTER — Telehealth: Payer: Self-pay | Admitting: Family Medicine

## 2016-06-01 NOTE — Telephone Encounter (Signed)
fyi

## 2016-06-01 NOTE — Telephone Encounter (Signed)
No answer left message to call back.

## 2016-06-01 NOTE — Telephone Encounter (Signed)
Pt NEED DR Carlota Raspberry TO GIVE HER A CALL BACK REGARDING INSTRUCTIONS SHE HAS AN APPOINTMENT THIS MORNING

## 2016-06-03 NOTE — Telephone Encounter (Signed)
Pt is returning our call   Best number [phone]

## 2016-06-04 NOTE — Telephone Encounter (Signed)
Patient stopped by to discuss status. She had a very large bowel movement the night after leaving our office, and then more bowel movement 2 days later. She has been using MiraLAX every night, did not end up needing the glycerin suppository. She has not had a bowel movement yet today or yesterday, but is comfortable, denies any bloating or abdominal pain.  Advised to continue MiraLAX nightly, unless she does not have a bowel movement within 2 days. Therefore in the morning if she has not yet had a bowel movement, increase to twice per day of MiraLAX, continue to increase fluid intake, and follow-up if any signs or symptoms of impaction again. Understanding expressed

## 2016-06-09 ENCOUNTER — Encounter: Payer: Self-pay | Admitting: Internal Medicine

## 2016-06-16 ENCOUNTER — Non-Acute Institutional Stay: Payer: Medicare Other | Admitting: Internal Medicine

## 2016-06-16 ENCOUNTER — Encounter: Payer: Self-pay | Admitting: Internal Medicine

## 2016-06-16 DIAGNOSIS — R2681 Unsteadiness on feet: Secondary | ICD-10-CM | POA: Diagnosis not present

## 2016-06-16 DIAGNOSIS — W19XXXA Unspecified fall, initial encounter: Secondary | ICD-10-CM | POA: Diagnosis not present

## 2016-06-16 DIAGNOSIS — M545 Low back pain: Secondary | ICD-10-CM | POA: Diagnosis not present

## 2016-06-16 DIAGNOSIS — M546 Pain in thoracic spine: Secondary | ICD-10-CM

## 2016-06-16 DIAGNOSIS — R079 Chest pain, unspecified: Secondary | ICD-10-CM | POA: Diagnosis not present

## 2016-06-16 DIAGNOSIS — R0789 Other chest pain: Secondary | ICD-10-CM

## 2016-06-16 NOTE — Progress Notes (Signed)
Location:  [place] Room Number: 1 Place of Service:  ALF [phone]) Provider:    Blanchie Serve, MD  Patient Care Team: Blanchie Serve, MD as PCP - General (Internal Medicine) Angelia Mould, MD as Attending Physician (Vascular Surgery) [name], MD as Attending Physician (Obstetrics and Gynecology) [name], MD (Orthopedic Surgery) [name], MD (Ophthalmology) [name], MD as Consulting Physician (Urology) [name], MD as Consulting Physician (Cardiology) [name], PA-C as Physician Assistant (Otolaryngology) [name], MD as Consulting Physician (Otolaryngology) [name], MD as Consulting Physician (Dermatology) [name], MD as Consulting Physician (Physical Medicine and Rehabilitation) [name], MD as Attending Physician (Gastroenterology) Mast, Man X, NP as Nurse Practitioner (Internal Medicine) Kathrynn Ducking, MD as Consulting Physician (Neurology) Debara Pickett Nadean Corwin, MD as Consulting Physician (Cardiology) Druscilla Brownie, MD as Consulting Physician (Dermatology)  Extended Emergency Contact Information Primary Emergency Contact: [place] Phone: [phone] Relation: Other Secondary Emergency Contact: Larina Bras [place] Phone: [phone] Mobile Phone: [phone] Relation: Son  Code Status:  Full code  Goals of care: Advanced Directive information Advanced Directives 05/18/2016  Does Patient Have a Medical Advance Directive? Yes  Type of Paramedic of [place];Living will  Does patient want to make changes to medical advance directive? No - Patient declined  Copy of [place] in Chart? Yes  Would patient like information on creating a medical advance directive? -     Chief Complaint  Patient presents with  . Acute Visit    Fall     HPI:  Patient is a 83 y.o.  female seen today for an acute visit for fall. She had a fall last night in her bathroom and fell backwards and hit her head and back. She denies being dizzy or having loss of consciousness. She lost her balance. She denies any headache. She complaints of pain to her right back area and right shoulder. She complaints of pain to chest and back area with deep breath.    Past Medical History:  Diagnosis Date  . Abdominal bloating   . Atrial fibrillation ([place])   . Bruises easily   . Carotid artery occlusion    LEFT  . Dizziness   . DOE (dyspnea on exertion) 04/02/2014  . Fall at home Sept 2013, Dec. 2013  Jun 08, 2012  . GERD (gastroesophageal reflux disease) 02/11/2015  . Headache(784.0)   . Hoarseness   . Hypercholesterolemia   . Hyperglycemia 04/02/2014   Glucose 178 mg percent on 01/22/2014 04/05/14 Hgb A1c 6.6 Diet controlled.    . Hypothyroidism 04/15/2015  . Neuropathy    PERIPHERAL  . Pruritus   . Scoliosis   . Varicose veins    Past Surgical History:  Procedure Laterality Date  . ABDOMINAL HYSTERECTOMY  1954  . CHOLECYSTECTOMY  1997  . SPINE SURGERY  1997   correct scoliosis    No Known Allergies  Outpatient Encounter Prescriptions as of 06/16/2016  Medication Sig  . acetaminophen (TYLENOL) 500 MG tablet Take 500 mg by mouth. Take one tablet every 6 hours as needed  . ALPRAZolam (XANAX) 0.25 MG tablet Take 0.25 mg by mouth. Take one tablet twice a day as needed for anxiety  . aspirin 81 MG tablet Take 81 mg by mouth daily.  Marland Kitchen atenolol (TENORMIN) 25 MG tablet TAKE 1 TABLET TWICE DAILY.  Marland Kitchen docusate sodium (COLACE) 100 MG capsule Take 100 mg by  mouth daily as needed.   . Multiple Vitamins-Calcium (VIACTIV MULTI-VITAMIN PO) Take 1 tablet by mouth daily.  . Multiple Vitamins-Minerals (CENTRUM ADULTS PO) Take 1 tablet by mouth daily.  Marland Kitchen OVER THE COUNTER MEDICATION   . polyethylene glycol (MIRALAX / GLYCOLAX) packet Take 17 g by mouth daily.  . psyllium (METAMUCIL) 58.6 % packet  Take 1 packet by mouth daily.  . ranitidine (ZANTAC) 150 MG tablet Take 150 mg by mouth. Take one tablet at at breakfast and one at bedtime  . simvastatin (ZOCOR) 20 MG tablet Take 20 mg by mouth daily.  . [DISCONTINUED] DULoxetine (CYMBALTA) 20 MG capsule Take 20 mg by mouth daily.  . [DISCONTINUED] simvastatin (ZOCOR) 40 MG tablet TAKE (1/2) TABLET DAILY.   No facility-administered encounter medications on file as of 06/16/2016.     Review of Systems  Constitutional: Negative for appetite change, diaphoresis, fatigue and fever.  HENT: Negative for congestion and rhinorrhea.   Eyes: Negative.   Respiratory: Negative for cough and shortness of breath.   Cardiovascular: Negative for chest pain and palpitations.  Genitourinary: Negative for dysuria.  Musculoskeletal: Positive for back pain.       Uses a walker to get around. No known history of recent fall.  Skin: Negative for rash and wound.    Immunization History  Administered Date(s) Administered  . Influenza Split 10/20/2011, 11/03/2012  . Influenza,inj,Quad PF,36+ Mos 09/29/2012, 11/09/2013  . Influenza-Unspecified 10/17/2014, 10/23/2015  . PPD Test 08/21/2013, 03/04/2014  . Pneumococcal Conjugate-13 08/21/2013  . Tdap 05/20/2008   Pertinent  Health Maintenance Due  Topic Date Due  . DEXA SCAN  01/31/1987  . PNA vac Low Risk Adult (2 of 2 - PPSV23) 08/22/2014  . INFLUENZA VACCINE  08/11/2016   Fall Risk  05/31/2016 09/02/2015 04/29/2015 04/01/2015 02/11/2015  Falls in the past year? No No No No No  Number falls in past yr: - - - - -  Injury with Fall? - - - - -  Risk Factor Category  - - - - -  Risk for fall due to : - - - - -  Risk for fall due to (comments): - - - - -   Functional Status Survey:    Vitals:   06/16/16 1033  BP: (!) 114/94  Pulse: 72  Resp: 20  Temp: (!) 96.2 F (35.7 C)  TempSrc: Oral  Weight: 98 lb (44.5 kg)  Height: 5' (1.524 m)   Body mass index is 19.14 kg/m. Physical Exam    Constitutional: She is oriented to person, place, and time.  Frail, thin built, in no acute distress  HENT:  Head: Normocephalic and atraumatic.  Mouth/Throat: Oropharynx is clear and moist.  Eyes: Conjunctivae are normal. Pupils are equal, round, and reactive to light.  Neck: Normal range of motion. Neck supple.  Cardiovascular:  Irregular heart rate  Pulmonary/Chest: Effort normal and breath sounds normal. She has no wheezes. She has no rales.  Reproducible right posterior chest wall tenderness  Abdominal: Soft. Bowel sounds are normal.  Musculoskeletal: Normal range of motion.  Normal ROM with right shoulder, kyphosis and scoliosis present, tenderness to right thoracic lower paraspinal area, no spinal tenderness  Lymphadenopathy:    She has no cervical adenopathy.  Neurological: She is alert and oriented to person, place, and time.  Skin: Skin is warm and dry.  Psychiatric: She has a normal mood and affect. Her behavior is normal.    Labs reviewed:  Recent Labs  07/07/15 11/06/15  NA  139 138  K 4.7 4.5  BUN 14 16  CREATININE 0.9 0.8    Recent Labs  11/06/15 05/12/16 1304  AST 24  --   ALT 10  --   ALKPHOS 99  --   PROT  --  6.5    Recent Labs  11/06/15  WBC 6.9  HGB 12.1  HCT 37  PLT 270   Lab Results  Component Value Date   TSH 3.08 11/06/2015   Lab Results  Component Value Date   HGBA1C 6.4 07/07/2015   Lab Results  Component Value Date   CHOL 148 07/07/2015   HDL 70 07/07/2015   LDLCALC 58 07/07/2015   TRIG 100 07/07/2015   CHOLHDL 3 07/09/2013    Significant Diagnostic Results in last 30 days:  No results found.  Assessment/Plan   Chest wall pain Post fall from trauma. likely musculoskeletal pain. Given her age and reproducible pain, get chest xray with rib view to rule out fracture. Tylenol 500 mg tid x 5 days and then prn for pain.  Right paraspinal pain Normal ROM of her neck. Get xray of thoracic and lumbar spine to rule out  fracture/ dislocation with recent fall, pain and history of scoliosis. Fall precautions  Fall initial encounter Mechanical fall with loss of balance. No signs of infection on exam. aao x 3. Xray to rule out fracture as above. Educated on need to use her walker all the time. During fall, she had been to bathroom without any assisitive device leading to mechanical fall. Patient understands and agrees to use her assistive device for transfer. Fall precautions.   Unsteady gait Encouraged to use her walker during transfer. If has further decline or antoher fall, will have therapy team to evaluate for gait training.    Family/ staff Communication: reviewed care plan with patient and charge nurse  Labs/tests ordered: rib xray, thoracic and lumbar spine xray.   Blanchie Serve, MD Internal Medicine [facility] [address] [place], [place] [address] Cell Phone (Monday-Friday 8 am - 5 pm): [phone] On Call: [phone] and follow prompts after 5 pm and on weekends Office Phone: [phone] Office Fax: [phone]

## 2016-06-17 ENCOUNTER — Encounter: Payer: Self-pay | Admitting: Family

## 2016-06-17 ENCOUNTER — Non-Acute Institutional Stay: Payer: Medicare Other | Admitting: Family

## 2016-06-17 DIAGNOSIS — S2241XS Multiple fractures of ribs, right side, sequela: Secondary | ICD-10-CM

## 2016-06-17 DIAGNOSIS — M8000XS Age-related osteoporosis with current pathological fracture, unspecified site, sequela: Secondary | ICD-10-CM

## 2016-06-17 NOTE — Progress Notes (Signed)
Location:  [place] Room Number: 1 Place of Service:  ALF [phone]) Provider: Juliette Standre FNP-C   Blanchie Serve, MD  Patient Care Team: Blanchie Serve, MD as PCP - General (Internal Medicine) Angelia Mould, MD as Attending Physician (Vascular Surgery) [name], MD as Attending Physician (Obstetrics and Gynecology) [name], MD (Orthopedic Surgery) [name], MD (Ophthalmology) [name], MD as Consulting Physician (Urology) [name], MD as Consulting Physician (Cardiology) [name], PA-C as Physician Assistant (Otolaryngology) [name], MD as Consulting Physician (Otolaryngology) [name], MD as Consulting Physician (Dermatology) [name], MD as Consulting Physician (Physical Medicine and Rehabilitation) [name], MD as Attending Physician (Gastroenterology) Mast, Man X, NP as Nurse Practitioner (Internal Medicine) Kathrynn Ducking, MD as Consulting Physician (Neurology) Debara Pickett Nadean Corwin, MD as Consulting Physician (Cardiology) Druscilla Brownie, MD as Consulting Physician (Dermatology)  Extended Emergency Contact Information Primary Emergency Contact: [place] Phone: [phone] Relation: Other Secondary Emergency Contact: Larina Bras [place] of [place] Phone: [phone] Mobile Phone: [phone] Relation: Son  Code Status: Full Code  Goals of care: Advanced Directive information Advanced Directives 06/17/2016  Does Patient Have a Medical Advance Directive? Yes  Type of Advance Directive [place]  Does patient want to make changes to medical advance directive? -  Copy of [place] in Chart? Yes  Would patient like information on creating a medical advance directive? -    Chief Complaint  Patient presents with  . Acute Visit    medication management; abnormal CXR results.     HPI:  Pt is  a 83 y.o. female seen today at [facility] for evaluation of acute issues.She has a medical history of Afib, PVD,Hypothyroidism, Neuropathy, OA among other conditions. She is seen in her room today. She denies any acute issues this visit. She states right side chest wall pain under control. " I don't like to take a lot of medication". Her recent CXR results done 06/16/2016 showed posterior rib fractures on 8th,9th and 10 th ribs. Also reported a nondisplaced fracture of the 7th rib undetermined age.Osteoporosis and scoliosis was also noted. She states currently taking own vitamins and calcium in her room. Nurse supervisor already aware and order given to add medication to [facility]. She denies any chest pain, cough, shortness of breath or wheezing. She has been up walking though forgets to use her front wheel walker at times.  Past Medical History:  Diagnosis Date  . Abdominal bloating   . Atrial fibrillation ([place])   . Bruises easily   . Carotid artery occlusion    LEFT  . Dizziness   . DOE (dyspnea on exertion) 04/02/2014  . Fall at home Sept 2013, Dec. 2013  Jun 08, 2012  . GERD (gastroesophageal reflux disease) 02/11/2015  . Headache(784.0)   . Hoarseness   . Hypercholesterolemia   . Hyperglycemia 04/02/2014   Glucose 178 mg percent on 01/22/2014 04/05/14 Hgb A1c 6.6 Diet controlled.    . Hypothyroidism 04/15/2015  . Neuropathy    PERIPHERAL  . Pruritus   . Scoliosis   . Varicose veins    Past Surgical History:  Procedure Laterality Date  . ABDOMINAL HYSTERECTOMY  1954  . CHOLECYSTECTOMY  1997  . SPINE SURGERY  1997   correct scoliosis    No Known Allergies  Allergies as of 06/17/2016   No Known Allergies     Medication List  Accurate as of 06/17/16  9:50 PM. Always use your most recent med list.          acetaminophen 500 MG tablet Commonly known as:  TYLENOL Take 500 mg by mouth. Take one tablet every 6 hours as needed   TYLENOL 167 MG/5ML Liqd Generic drug:   Acetaminophen Take 500 mg by mouth 3 (three) times daily.   ALPRAZolam 0.25 MG tablet Commonly known as:  XANAX Take 0.25 mg by mouth. Take one tablet twice a day as needed for anxiety   aspirin 81 MG tablet Take 81 mg by mouth daily.   atenolol 25 MG tablet Commonly known as:  TENORMIN TAKE 1 TABLET TWICE DAILY.   Biotin 5000 MCG Caps Take 1 capsule by mouth daily.   CENTRUM ADULTS PO Take 1 tablet by mouth daily.   docusate sodium 100 MG capsule Commonly known as:  COLACE Take 100 mg by mouth daily as needed.   DULoxetine 20 MG capsule Commonly known as:  CYMBALTA Take 20 mg by mouth at bedtime.   OVER THE COUNTER MEDICATION   polyethylene glycol packet Commonly known as:  MIRALAX / GLYCOLAX Take 17 g by mouth daily.   psyllium 58.6 % packet Commonly known as:  METAMUCIL Take 1 packet by mouth daily.   ranitidine 150 MG tablet Commonly known as:  ZANTAC Take 150 mg by mouth. Take one tablet at at breakfast and one at bedtime   simethicone 125 MG chewable tablet Commonly known as:  MYLICON Chew 161 mg by mouth 4 (four) times daily - after meals and at bedtime.   simvastatin 20 MG tablet Commonly known as:  ZOCOR Take 20 mg by mouth daily.   traMADol 50 MG tablet Commonly known as:  ULTRAM Take 50 mg by mouth every 6 (six) hours as needed for moderate pain.   VIACTIV MULTI-VITAMIN PO Take 1 tablet by mouth daily.       Review of Systems  Constitutional: Negative for activity change, appetite change, chills, fatigue and fever.  HENT: Negative for congestion, rhinorrhea, sinus pain, sinus pressure, sneezing and sore throat.   Eyes: Negative.   Respiratory: Negative for cough, chest tightness, shortness of breath and wheezing.        Right chest wall pain under control  Cardiovascular: Negative for chest pain, palpitations and leg swelling.  Gastrointestinal: Negative for abdominal distention, abdominal pain, constipation, diarrhea, nausea and vomiting.   Endocrine: Negative.   Genitourinary: Negative for dysuria, flank pain, frequency and urgency.  Musculoskeletal: Positive for gait problem.  Skin: Negative for color change, pallor and rash.  Neurological: Negative for dizziness, seizures, syncope, light-headedness and headaches.  Hematological: Does not bruise/bleed easily.  Psychiatric/Behavioral: Negative for agitation, confusion, hallucinations and sleep disturbance. The patient is not nervous/anxious.     Immunization History  Administered Date(s) Administered  . Influenza Split 10/20/2011, 11/03/2012  . Influenza,inj,Quad PF,36+ Mos 09/29/2012, 11/09/2013  . Influenza-Unspecified 10/17/2014, 10/23/2015  . PPD Test 08/21/2013, 03/04/2014  . Pneumococcal Conjugate-13 08/21/2013  . Tdap 05/20/2008  . Zoster 11/01/2012   Pertinent  Health Maintenance Due  Topic Date Due  . DEXA SCAN  01/31/1987  . PNA vac Low Risk Adult (2 of 2 - PPSV23) 08/22/2014  . INFLUENZA VACCINE  08/11/2016   Fall Risk  05/31/2016 09/02/2015 04/29/2015 04/01/2015 02/11/2015  Falls in the past year? No No No No No  Number falls in past yr: - - - - -  Injury with Fall? - - - - -  Risk Factor Category  - - - - -  Risk for fall due to : - - - - -  Risk for fall due to (comments): - - - - -    Vitals:   06/17/16 1408  BP: 140/81  Pulse: 76  Resp: 20  Temp: 98 F (36.7 C)  SpO2: 95%  Weight: 98 lb (44.5 kg)  Height: 5' (1.524 m)   Body mass index is 19.14 kg/m. Physical Exam  Constitutional: She is oriented to person, place, and time.  Thin frail Elderly in no acute distress   HENT:  Head: Normocephalic.  Mouth/Throat: Oropharynx is clear and moist. No oropharyngeal exudate.  Eyes: Conjunctivae and EOM are normal. Pupils are equal, round, and reactive to light. Right eye exhibits no discharge. Left eye exhibits no discharge. No scleral icterus.  Neck: Normal range of motion. No JVD present. No thyromegaly present.  Cardiovascular: Intact distal  pulses.  Exam reveals no gallop and no friction rub.   Murmur heard. Irregular Heart rate   Pulmonary/Chest: Effort normal and breath sounds normal. No respiratory distress. She has no wheezes. She has no rales. She exhibits no tenderness.  Abdominal: Soft. Bowel sounds are normal. She exhibits no distension. There is no tenderness. There is no rebound and no guarding.  Musculoskeletal: She exhibits no edema, tenderness or deformity.  Moves x 4 extremities.Unsteady gait.Uses FWW but forgets it as times. Right side wall slight tender to touch   Lymphadenopathy:    She has no cervical adenopathy.  Neurological: She is oriented to person, place, and time.  Skin: Skin is warm and dry. No rash noted. No erythema. No pallor.  Psychiatric: She has a normal mood and affect.   Labs reviewed:  Recent Labs  07/07/15 11/06/15  NA 139 138  K 4.7 4.5  BUN 14 16  CREATININE 0.9 0.8    Recent Labs  11/06/15 05/12/16 1304  AST 24  --   ALT 10  --   ALKPHOS 99  --   PROT  --  6.5    Recent Labs  11/06/15  WBC 6.9  HGB 12.1  HCT 37  PLT 270   Lab Results  Component Value Date   TSH 3.08 11/06/2015   Lab Results  Component Value Date   HGBA1C 6.4 07/07/2015   Lab Results  Component Value Date   CHOL 148 07/07/2015   HDL 70 07/07/2015   LDLCALC 58 07/07/2015   TRIG 100 07/07/2015   CHOLHDL 3 07/09/2013   Assessment/Plan 1. Age-related osteoporosis with current pathological fracture, sequela Current on MVI, calcium plus vitamin D supplements.Continue to encourage oral intake.   2. Closed fracture of multiple ribs of right side CXR results done 06/16/2016 showed posterior rib fractures on 8th,9th and 10 th ribs. Also showed a nondisplaced fracture of the 7th rib undetermined age.Asymptomatic. Breathing without any difficulties.Pain under control.Tramadol 25 mg tablet as needed ordered by on call provider. She prefers to take tylenol for pain.continue to monitor. Fall and safety  precautions.  Family/ staff Communication: Reviewed plan of care with patient and facility Nurse supervisor   Labs/tests ordered: None   Sandrea Hughs, NP

## 2016-06-21 ENCOUNTER — Other Ambulatory Visit: Payer: Self-pay | Admitting: Gastroenterology

## 2016-06-23 ENCOUNTER — Encounter: Payer: Self-pay | Admitting: Internal Medicine

## 2016-06-28 ENCOUNTER — Non-Acute Institutional Stay: Payer: Medicare Other | Admitting: Internal Medicine

## 2016-06-28 ENCOUNTER — Encounter: Payer: Self-pay | Admitting: Internal Medicine

## 2016-06-28 DIAGNOSIS — R2681 Unsteadiness on feet: Secondary | ICD-10-CM

## 2016-06-28 DIAGNOSIS — M4805 Spinal stenosis, thoracolumbar region: Secondary | ICD-10-CM | POA: Diagnosis not present

## 2016-06-28 DIAGNOSIS — G6289 Other specified polyneuropathies: Secondary | ICD-10-CM

## 2016-06-28 DIAGNOSIS — R296 Repeated falls: Secondary | ICD-10-CM | POA: Diagnosis not present

## 2016-06-28 DIAGNOSIS — M810 Age-related osteoporosis without current pathological fracture: Secondary | ICD-10-CM | POA: Diagnosis not present

## 2016-06-28 DIAGNOSIS — I6523 Occlusion and stenosis of bilateral carotid arteries: Secondary | ICD-10-CM | POA: Diagnosis not present

## 2016-06-28 NOTE — Addendum Note (Signed)
Addended byBlanchie Serve on: 06/28/2016 06:04 PM   Modules accepted: Level of Service

## 2016-06-28 NOTE — Progress Notes (Signed)
Location:  [place] Room Number: 1 Place of Service:  ALF [phone]) Provider:    Blanchie Serve, MD  Patient Care Team: Blanchie Serve, MD as PCP - General (Internal Medicine) Angelia Mould, MD as Attending Physician (Vascular Surgery) [name], MD as Attending Physician (Obstetrics and Gynecology) [name], MD (Orthopedic Surgery) [name], MD (Ophthalmology) [name], MD as Consulting Physician (Urology) [name], MD as Consulting Physician (Cardiology) [name], PA-C as Physician Assistant (Otolaryngology) [name], MD as Consulting Physician (Otolaryngology) [name], MD as Consulting Physician (Dermatology) [name], MD as Consulting Physician (Physical Medicine and Rehabilitation) [name], MD as Attending Physician (Gastroenterology) Mast, Man X, NP as Nurse Practitioner (Internal Medicine) Kathrynn Ducking, MD as Consulting Physician (Neurology) Debara Pickett Nadean Corwin, MD as Consulting Physician (Cardiology) Druscilla Brownie, MD as Consulting Physician (Dermatology)  Extended Emergency Contact Information Primary Emergency Contact: [place] of [place] Phone: [phone] Relation: Other Secondary Emergency Contact: Larina Bras [place] of [place] Phone: [phone] Mobile Phone: [phone] Relation: Son  Code Status:  Full code  Goals of care: Advanced Directive information Advanced Directives 06/28/2016  Does Patient Have a Medical Advance Directive? Yes  Type of Advance Directive [place]  Does patient want to make changes to medical advance directive? -  Copy of [place] in Chart? Yes  Would patient like information on creating a medical advance directive? -     Chief Complaint  Patient presents with  . Acute Visit    Frequent falls with left-sided chest pain    HPI:  Patient is a 83 y.o.  female seen today for an acute visit for fall. She has had 3 falls in last 2 weeks. She fell and hit her head and left arms and legs each time. She mentions losing balance but denies any dizziness or lightheadedness. Denies any syncope or loss of consciousness. Post first fall, she had pain to left chest wall and xray showed left 7th-10th ribs posterior fracture.    Past Medical History:  Diagnosis Date  . Abdominal bloating   . Atrial fibrillation ([place])   . Bruises easily   . Carotid artery occlusion    LEFT  . Dizziness   . DOE (dyspnea on exertion) 04/02/2014  . Fall at home Sept 2013, Dec. 2013  Jun 08, 2012  . GERD (gastroesophageal reflux disease) 02/11/2015  . Headache(784.0)   . Hoarseness   . Hypercholesterolemia   . Hyperglycemia 04/02/2014   Glucose 178 mg percent on 01/22/2014 04/05/14 Hgb A1c 6.6 Diet controlled.    . Hypothyroidism 04/15/2015  . Neuropathy    PERIPHERAL  . Pruritus   . Scoliosis   . Varicose veins    Past Surgical History:  Procedure Laterality Date  . ABDOMINAL HYSTERECTOMY  1954  . CHOLECYSTECTOMY  1997  . SPINE SURGERY  1997   correct scoliosis    No Known Allergies  Outpatient Encounter Prescriptions as of 06/28/2016  Medication Sig  . acetaminophen (TYLENOL) 500 MG tablet Take 500 mg by mouth. Take one tablet every 6 hours as needed  . ALPRAZolam (XANAX) 0.25 MG tablet Take 0.25 mg by mouth. Take one tablet twice a day as needed for anxiety  . aspirin 81 MG tablet Take 81 mg by mouth daily.  Marland Kitchen atenolol (TENORMIN) 25 MG tablet TAKE 1 TABLET TWICE DAILY.  Marland Kitchen Biotin 5000 MCG CAPS Take 1 capsule by mouth daily.  . DULoxetine (  CYMBALTA) 20 MG capsule Take 20 mg by mouth at bedtime.  . Multiple Vitamins-Calcium (VIACTIV MULTI-VITAMIN PO) Take 1 tablet by mouth daily.  . Multiple Vitamins-Minerals (CENTRUM ADULTS PO) Take 1 tablet by mouth daily.  Marland Kitchen OVER THE COUNTER MEDICATION Take 1 capsule by mouth daily. FD Donald Prose  . polyethylene glycol (MIRALAX /  GLYCOLAX) packet Take 17 g by mouth daily.  . ranitidine (ZANTAC) 75 MG/5ML syrup TAKE 2 TEASPOONFULS(10 MLS) TWICE DAILY.  . simethicone (MYLICON) 161 MG chewable tablet Chew 125 mg by mouth 4 (four) times daily - after meals and at bedtime.  . simvastatin (ZOCOR) 20 MG tablet Take 20 mg by mouth daily.  . traMADol (ULTRAM) 50 MG tablet Take 50 mg by mouth every 6 (six) hours as needed for moderate pain.  . [DISCONTINUED] docusate sodium (COLACE) 100 MG capsule Take 100 mg by mouth daily as needed.   . [DISCONTINUED] OVER THE COUNTER MEDICATION   . [DISCONTINUED] psyllium (METAMUCIL) 58.6 % packet Take 1 packet by mouth daily.  . [DISCONTINUED] ranitidine (ZANTAC) 150 MG tablet Take 150 mg by mouth. Take one tablet at at breakfast and one at bedtime   No facility-administered encounter medications on file as of 06/28/2016.     Review of Systems  Constitutional: Negative for activity change, appetite change, chills, diaphoresis and fever.  HENT: Negative for mouth sores and rhinorrhea.   Eyes: Negative for visual disturbance.  Respiratory: Negative for cough and shortness of breath.   Cardiovascular: Negative for chest pain, palpitations and leg swelling.  Gastrointestinal: Negative for abdominal pain, blood in stool, nausea and vomiting.  Genitourinary: Negative for dysuria and flank pain.  Musculoskeletal: Positive for back pain.       Chronic low back pain s/p back fusion surgery  Neurological: Negative for light-headedness and numbness.  Psychiatric/Behavioral: Negative for behavioral problems.    Immunization History  Administered Date(s) Administered  . Influenza Split 10/20/2011, 11/03/2012  . Influenza,inj,Quad PF,36+ Mos 09/29/2012, 11/09/2013  . Influenza-Unspecified 10/17/2014, 10/23/2015  . PPD Test 08/21/2013, 03/04/2014  . Pneumococcal Conjugate-13 08/21/2013  . Tdap 05/20/2008  . Zoster 11/01/2012   Pertinent  Health Maintenance Due  Topic Date Due  . DEXA SCAN   01/31/1987  . PNA vac Low Risk Adult (2 of 2 - PPSV23) 08/22/2014  . INFLUENZA VACCINE  08/11/2016   Fall Risk  05/31/2016 09/02/2015 04/29/2015 04/01/2015 02/11/2015  Falls in the past year? No No No No No  Number falls in past yr: - - - - -  Injury with Fall? - - - - -  Risk Factor Category  - - - - -  Risk for fall due to : - - - - -  Risk for fall due to (comments): - - - - -   Functional Status Survey:    Vitals:   06/28/16 1504  BP: 128/66  Pulse: 79  Resp: 18  Temp: 98.4 F (36.9 C)  SpO2: 96%  Weight: 98 lb (44.5 kg)  Height: 5' (1.524 m)   Body mass index is 19.14 kg/m. Physical Exam  Constitutional: She is oriented to person, place, and time. No distress.  Elderly female, thin built, frail  HENT:  Head: Normocephalic and atraumatic.  Mouth/Throat: Oropharynx is clear and moist.  Eyes: Conjunctivae are normal. Pupils are equal, round, and reactive to light.  Neck: Normal range of motion. Neck supple.  Cardiovascular:  Irregular heart rate  Pulmonary/Chest: Effort normal and breath sounds normal. She exhibits tenderness.  Left chest  wall tenderness  Abdominal: Soft. Bowel sounds are normal. There is no tenderness.  Musculoskeletal:  Able to move all 4 extremities, left paraspinal tenderness, uses a walker, broad based gait  Lymphadenopathy:    She has no cervical adenopathy.  Neurological: She is alert and oriented to person, place, and time.  Skin: Skin is warm and dry. She is not diaphoretic.  Bruise to LUE, LLE, knee bruise, left eyebrow area with dried scab  Psychiatric: She has a normal mood and affect.    Labs reviewed:  Recent Labs  07/07/15 11/06/15  NA 139 138  K 4.7 4.5  BUN 14 16  CREATININE 0.9 0.8    Recent Labs  11/06/15 05/12/16 1304  AST 24  --   ALT 10  --   ALKPHOS 99  --   PROT  --  6.5    Recent Labs  11/06/15  WBC 6.9  HGB 12.1  HCT 37  PLT 270   Lab Results  Component Value Date   TSH 3.08 11/06/2015   Lab  Results  Component Value Date   HGBA1C 6.4 07/07/2015   Lab Results  Component Value Date   CHOL 148 07/07/2015   HDL 70 07/07/2015   LDLCALC 58 07/07/2015   TRIG 100 07/07/2015   CHOLHDL 3 07/09/2013    Significant Diagnostic Results in last 30 days:  No results found.  Assessment/Plan  Unsteady gait Likely from spinal stenosis and possible deconditioning. Also has possible peripheral neuropathy. Using a walker at present. Has 3 falls in last 2 week. Will have her work with physical therapy and occupational therapy team to help with gait training and muscle strengthening exercises.fall precautions. Skin care.   Frequent falls Has history of spinal stenosis and is s/p lumbosacral fusion. Has neuropathy. Also has chronic central osteoporotic compression of T5-T12 vertebral bodies. Imaging also showed narrowing at T12-L1 and L1-L2. Advised not to travel without her assistive device. Will have patient work with PT/OT as tolerated to regain strength and restore function.  Fall precautions are in place. Check cbc with diff and cmp to rule out metabolic causes. If symptom worsens, get CT of spine to evaluate further.   Spinal stenosis Denies radiculopathy. Her worsening stenosis is likely contributing to frequent falls. Monitor for signs of cord compression. Patient does not want imaging study to assess further at present. She agrees to have CT/MRI spine to assess further if she has another fall or her clinical symptom worsens.   Severe osteoporosis high risk for pathological fracture. Fall precautions. Start weekly fosamax for now with daily calcium and vitamin d and monitor.   Peripheral neuropathy Denies nerve pain. Denies numbness or tingling to her leg. Currently on low dose cymbalta per neurology rec, she has not started it yet and would like to wait until further follow up.     Family/ staff Communication: reviewed care plan with patient and charge nurse.    Labs/tests ordered:   Cbc, cmp  Blanchie Serve, MD Internal Medicine [facility] [address] [place], [place] [address] Cell Phone (Monday-Friday 8 am - 5 pm): [phone] On Call: [phone] and follow prompts after 5 pm and on weekends Office Phone: [phone] Office Fax: [phone]

## 2016-06-29 ENCOUNTER — Encounter: Payer: Medicare Other | Admitting: Internal Medicine

## 2016-06-30 ENCOUNTER — Other Ambulatory Visit: Payer: Self-pay | Admitting: Internal Medicine

## 2016-06-30 DIAGNOSIS — I482 Chronic atrial fibrillation: Secondary | ICD-10-CM | POA: Diagnosis not present

## 2016-06-30 DIAGNOSIS — R2681 Unsteadiness on feet: Secondary | ICD-10-CM | POA: Diagnosis not present

## 2016-06-30 DIAGNOSIS — R296 Repeated falls: Secondary | ICD-10-CM | POA: Diagnosis not present

## 2016-06-30 NOTE — Addendum Note (Signed)
Addended by: Royann Shivers A on: 06/30/2016 02:21 PM   Modules accepted: Orders

## 2016-07-01 DIAGNOSIS — R2681 Unsteadiness on feet: Secondary | ICD-10-CM | POA: Diagnosis not present

## 2016-07-01 DIAGNOSIS — I482 Chronic atrial fibrillation: Secondary | ICD-10-CM | POA: Diagnosis not present

## 2016-07-01 DIAGNOSIS — I5022 Chronic systolic (congestive) heart failure: Secondary | ICD-10-CM | POA: Diagnosis not present

## 2016-07-01 DIAGNOSIS — R296 Repeated falls: Secondary | ICD-10-CM | POA: Diagnosis not present

## 2016-07-01 LAB — HEPATIC FUNCTION PANEL
ALT: 11 (ref 7–35)
AST: 22 (ref 13–35)
Alkaline Phosphatase: 101 (ref 25–125)
Bilirubin, Total: 0.9

## 2016-07-01 LAB — BASIC METABOLIC PANEL
BUN: 12 (ref 4–21)
Creatinine: 0.8 (ref 0.5–1.1)
Glucose: 82
Potassium: 4 (ref 3.4–5.3)
Sodium: 138 (ref 137–147)

## 2016-07-01 LAB — CBC AND DIFFERENTIAL
HCT: 39 (ref 36–46)
Hemoglobin: 12.9 (ref 12.0–16.0)
Platelets: 296 (ref 150–399)
WBC: 9.6

## 2016-07-05 DIAGNOSIS — R296 Repeated falls: Secondary | ICD-10-CM | POA: Diagnosis not present

## 2016-07-05 DIAGNOSIS — I482 Chronic atrial fibrillation: Secondary | ICD-10-CM | POA: Diagnosis not present

## 2016-07-05 DIAGNOSIS — R2681 Unsteadiness on feet: Secondary | ICD-10-CM | POA: Diagnosis not present

## 2016-07-08 ENCOUNTER — Encounter: Payer: Self-pay | Admitting: Family

## 2016-07-08 ENCOUNTER — Non-Acute Institutional Stay: Payer: Medicare Other | Admitting: Family

## 2016-07-08 DIAGNOSIS — K5901 Slow transit constipation: Secondary | ICD-10-CM

## 2016-07-08 DIAGNOSIS — R2681 Unsteadiness on feet: Secondary | ICD-10-CM | POA: Diagnosis not present

## 2016-07-08 DIAGNOSIS — M25562 Pain in left knee: Secondary | ICD-10-CM | POA: Diagnosis not present

## 2016-07-08 DIAGNOSIS — R296 Repeated falls: Secondary | ICD-10-CM | POA: Diagnosis not present

## 2016-07-08 DIAGNOSIS — I482 Chronic atrial fibrillation: Secondary | ICD-10-CM | POA: Diagnosis not present

## 2016-07-08 DIAGNOSIS — N3941 Urge incontinence: Secondary | ICD-10-CM | POA: Diagnosis not present

## 2016-07-08 DIAGNOSIS — R3912 Poor urinary stream: Secondary | ICD-10-CM | POA: Diagnosis not present

## 2016-07-09 DIAGNOSIS — R296 Repeated falls: Secondary | ICD-10-CM | POA: Diagnosis not present

## 2016-07-09 DIAGNOSIS — R2681 Unsteadiness on feet: Secondary | ICD-10-CM | POA: Diagnosis not present

## 2016-07-09 DIAGNOSIS — I482 Chronic atrial fibrillation: Secondary | ICD-10-CM | POA: Diagnosis not present

## 2016-07-13 DIAGNOSIS — I482 Chronic atrial fibrillation: Secondary | ICD-10-CM | POA: Diagnosis not present

## 2016-07-13 DIAGNOSIS — R296 Repeated falls: Secondary | ICD-10-CM | POA: Diagnosis not present

## 2016-07-13 DIAGNOSIS — R2681 Unsteadiness on feet: Secondary | ICD-10-CM | POA: Diagnosis not present

## 2016-07-14 MED ORDER — DOCUSATE SODIUM 100 MG PO CAPS: 100.0000 mg | ORAL_CAPSULE | Freq: Every day | ORAL | 0 refills | Status: DC

## 2016-07-14 NOTE — Progress Notes (Signed)
Location:  [place] Room Number: 1 Place of Service:  ALF [phone]) Provider: Adeena Bernabe FNP-C  Blanchie Serve, MD  Patient Care Team: Blanchie Serve, MD as PCP - General (Internal Medicine) Angelia Mould, MD as Attending Physician (Vascular Surgery) [name], MD as Attending Physician (Obstetrics and Gynecology) [name], MD (Orthopedic Surgery) [name], MD (Ophthalmology) [name], MD as Consulting Physician (Urology) [name], MD as Consulting Physician (Cardiology) [name], PA-C as Physician Assistant (Otolaryngology) [name], MD as Consulting Physician (Otolaryngology) [name], MD as Consulting Physician (Dermatology) [name], MD as Consulting Physician (Physical Medicine and Rehabilitation) [name], MD as Attending Physician (Gastroenterology) Mast, Man X, NP as Nurse Practitioner (Internal Medicine) Kathrynn Ducking, MD as Consulting Physician (Neurology) Debara Pickett Nadean Corwin, MD as Consulting Physician (Cardiology) Druscilla Brownie, MD as Consulting Physician (Dermatology)  Extended Emergency Contact Information Primary Emergency Contact: [place] Phone: [phone] Relation: Other Secondary Emergency Contact: Larina Bras [place] of [place] Phone: [phone] Mobile Phone: [phone] Relation: Son  Code Status:  Full code  Goals of care: Advanced Directive information Advanced Directives 07/08/2016  Does Patient Have a Medical Advance Directive? -  Type of Advance Directive [place]  Does patient want to make changes to medical advance directive? -  Copy of [place] in Chart? Yes  Would patient like information on creating a medical advance directive? -     Chief Complaint  Patient presents with  . Acute Visit    Left knee pain    HPI:  Pt is a 83 y.o. female seen today  at [facility] for an acute visit for evaluation of Left Knee pain. She has a significant history of multiple falls last episode one week ago.she is seen in her room today per facility Nurse request. Patient complained of left knee pain though states took tylenol with relief.she had an open area on left knee from previous fall dressing changed by Nurse. She also complains of passing round hard stool this morning. She denies any fever or chills.   Past Medical History:  Diagnosis Date  . Abdominal bloating   . Atrial fibrillation ([place])   . Bruises easily   . Carotid artery occlusion    LEFT  . Dizziness   . DOE (dyspnea on exertion) 04/02/2014  . Fall at home Sept 2013, Dec. 2013  Jun 08, 2012  . GERD (gastroesophageal reflux disease) 02/11/2015  . Headache(784.0)   . Hoarseness   . Hypercholesterolemia   . Hyperglycemia 04/02/2014   Glucose 178 mg percent on 01/22/2014 04/05/14 Hgb A1c 6.6 Diet controlled.    . Hypothyroidism 04/15/2015  . Neuropathy    PERIPHERAL  . Pruritus   . Scoliosis   . Varicose veins    Past Surgical History:  Procedure Laterality Date  . ABDOMINAL HYSTERECTOMY  1954  . CHOLECYSTECTOMY  1997  . SPINE SURGERY  1997   correct scoliosis    No Known Allergies  Allergies as of 07/08/2016   No Known Allergies     Medication List       Accurate as of 07/08/16 11:59 PM. Always use your most recent med list.          acetaminophen 500 MG tablet Commonly known as:  TYLENOL Take 500 mg by mouth. Take one tablet every 6 hours as needed   alendronate 70 MG tablet Commonly known as:  FOSAMAX Take  70 mg by mouth once a week. Take with a full glass of water on an empty stomach.   ALPRAZolam 0.25 MG tablet Commonly known as:  XANAX Take 0.25 mg by mouth. Take one tablet twice a day as needed for anxiety   aspirin 81 MG tablet Take 81 mg by mouth daily.   atenolol 25 MG tablet Commonly known as:  TENORMIN TAKE 1 TABLET TWICE DAILY.   Biotin 5000  MCG Caps Take 1 capsule by mouth daily.   CENTRUM ADULTS PO Take 1 tablet by mouth daily.   docusate sodium 100 MG capsule Commonly known as:  COLACE Take 1 capsule (100 mg total) by mouth daily.   DULoxetine 20 MG capsule Commonly known as:  CYMBALTA Take 20 mg by mouth at bedtime.   OVER THE COUNTER MEDICATION Take 1 capsule by mouth daily. FD Gard   polyethylene glycol packet Commonly known as:  MIRALAX / GLYCOLAX Take 17 g by mouth daily.   ranitidine 75 MG/5ML syrup Commonly known as:  ZANTAC TAKE 2 TEASPOONFULS(10 MLS) TWICE DAILY.   simethicone 80 MG chewable tablet Commonly known as:  MYLICON Chew 80 mg by mouth 4 (four) times daily as needed for flatulence.   simvastatin 20 MG tablet Commonly known as:  ZOCOR Take 20 mg by mouth daily.   traMADol 50 MG tablet Commonly known as:  ULTRAM Take 50 mg by mouth every 6 (six) hours as needed for moderate pain.   VIACTIV MULTI-VITAMIN PO Take 1 tablet by mouth daily.       Review of Systems  Constitutional: Negative for activity change, appetite change, chills, fatigue and fever.  HENT: Negative for congestion, rhinorrhea, sinus pain, sinus pressure, sneezing and sore throat.   Eyes: Negative.   Respiratory: Negative for cough, chest tightness, shortness of breath and wheezing.   Cardiovascular: Negative for chest pain, palpitations and leg swelling.  Gastrointestinal: Negative for abdominal distention, abdominal pain, constipation, diarrhea, nausea and vomiting.  Genitourinary: Negative for dysuria, flank pain, frequency and urgency.  Musculoskeletal: Positive for gait problem.       Chronic back pain   Skin: Negative for color change, pallor and rash.  Neurological: Negative for dizziness, seizures, syncope, light-headedness and headaches.  Psychiatric/Behavioral: Negative for agitation, confusion, hallucinations and sleep disturbance. The patient is not nervous/anxious.     Immunization History    Administered Date(s) Administered  . Influenza Split 10/20/2011, 11/03/2012  . Influenza,inj,Quad PF,36+ Mos 09/29/2012, 11/09/2013  . Influenza-Unspecified 10/17/2014, 10/23/2015  . PPD Test 08/21/2013, 03/04/2014  . Pneumococcal Conjugate-13 08/21/2013  . Tdap 05/20/2008  . Zoster 11/01/2012   Pertinent  Health Maintenance Due  Topic Date Due  . DEXA SCAN  01/31/1987  . PNA vac Low Risk Adult (2 of 2 - PPSV23) 08/22/2014  . INFLUENZA VACCINE  08/11/2016   Fall Risk  05/31/2016 09/02/2015 04/29/2015 04/01/2015 02/11/2015  Falls in the past year? No No No No No  Number falls in past yr: - - - - -  Injury with Fall? - - - - -  Risk Factor Category  - - - - -  Risk for fall due to : - - - - -  Risk for fall due to (comments): - - - - -    Vitals:   07/08/16 1308  BP: (!) 101/59  Pulse: 88  Resp: 15  Temp: 97.9 F (36.6 C)  SpO2: 99%  Weight: 98 lb (44.5 kg)  Height: 5' (1.524 m)   Body mass  index is 19.14 kg/m. Physical Exam  Constitutional: She is oriented to person, place, and time.  Thin frail Elderly in no acute distress   HENT:  Head: Normocephalic.  Mouth/Throat: Oropharynx is clear and moist. No oropharyngeal exudate.  Eyes: Conjunctivae and EOM are normal. Pupils are equal, round, and reactive to light. Right eye exhibits no discharge. Left eye exhibits no discharge. No scleral icterus.  Neck: Normal range of motion. No JVD present. No thyromegaly present.  Cardiovascular: Intact distal pulses.  Exam reveals no gallop and no friction rub.   Murmur heard. Irregular Heart rate   Pulmonary/Chest: Effort normal and breath sounds normal. No respiratory distress. She has no wheezes. She has no rales. She exhibits no tenderness.  Abdominal: Soft. Bowel sounds are normal. She exhibits no distension. There is no tenderness. There is no rebound and no guarding.  Musculoskeletal: She exhibits no edema, tenderness or deformity.  Moves x 4 extremities without any  difficulties.Left knee FROM no crepitus or tenderness. Unsteady gait.   Lymphadenopathy:    She has no cervical adenopathy.  Neurological: She is oriented to person, place, and time.  Skin: Skin is warm and dry. No rash noted. No erythema. No pallor.  Left knee previous open area progressive healing. Slight tender to touch but no redness or drainage noted.   Psychiatric: She has a normal mood and affect.    Labs reviewed:  Recent Labs  11/06/15 07/01/16  NA 138 138  K 4.5 4.0  BUN 16 12  CREATININE 0.8 0.8    Recent Labs  11/06/15 05/12/16 1304 07/01/16  AST 24  --  22  ALT 10  --  11  ALKPHOS 99  --  101  PROT  --  6.5  --     Recent Labs  11/06/15 07/01/16  WBC 6.9 9.6  HGB 12.1 12.9  HCT 37 39  PLT 270 296   Lab Results  Component Value Date   TSH 3.08 11/06/2015   Lab Results  Component Value Date   HGBA1C 6.4 07/07/2015   Lab Results  Component Value Date   CHOL 148 07/07/2015   HDL 70 07/07/2015   LDLCALC 58 07/07/2015   TRIG 100 07/07/2015   CHOLHDL 3 07/09/2013    Significant Diagnostic Results in last 30 days:  No results found.  Assessment/Plan 1. Left knee pain, unspecified chronicity Full ROM without difficulties.Previous open area from fall one week ago progressive healing. Slight tender to touch but no redness or drainage noted.continue to apply dry dressing. Monitor for signs of infections.continue tylenol as needed.   2. Slow transit constipation Reports round hard stool prior to visit.continue on Miralax daily. Add Colace 100 mg Capsule one by mouth daily.continue to encourage oral  and fluid intake.continue to monitor.  Family/ staff Communication: Reviewed plan of care with patient and facility Nurse supervisor  Labs/tests ordered: None   Sandrea Hughs, NP

## 2016-07-15 DIAGNOSIS — R296 Repeated falls: Secondary | ICD-10-CM | POA: Diagnosis not present

## 2016-07-15 DIAGNOSIS — R2681 Unsteadiness on feet: Secondary | ICD-10-CM | POA: Diagnosis not present

## 2016-07-15 DIAGNOSIS — I482 Chronic atrial fibrillation: Secondary | ICD-10-CM | POA: Diagnosis not present

## 2016-07-15 NOTE — Addendum Note (Signed)
Addended byMarlowe Sax C on: 07/15/2016 12:43 PM   Modules accepted: Level of Service

## 2016-07-19 DIAGNOSIS — M25562 Pain in left knee: Secondary | ICD-10-CM | POA: Diagnosis not present

## 2016-07-20 DIAGNOSIS — I482 Chronic atrial fibrillation: Secondary | ICD-10-CM | POA: Diagnosis not present

## 2016-07-20 DIAGNOSIS — R2681 Unsteadiness on feet: Secondary | ICD-10-CM | POA: Diagnosis not present

## 2016-07-20 DIAGNOSIS — R296 Repeated falls: Secondary | ICD-10-CM | POA: Diagnosis not present

## 2016-07-22 ENCOUNTER — Encounter: Payer: Self-pay | Admitting: *Deleted

## 2016-07-22 DIAGNOSIS — I4891 Unspecified atrial fibrillation: Secondary | ICD-10-CM | POA: Diagnosis not present

## 2016-07-22 DIAGNOSIS — I6529 Occlusion and stenosis of unspecified carotid artery: Secondary | ICD-10-CM | POA: Diagnosis not present

## 2016-07-22 DIAGNOSIS — I482 Chronic atrial fibrillation: Secondary | ICD-10-CM | POA: Diagnosis not present

## 2016-07-22 DIAGNOSIS — R296 Repeated falls: Secondary | ICD-10-CM | POA: Diagnosis not present

## 2016-07-22 DIAGNOSIS — R2681 Unsteadiness on feet: Secondary | ICD-10-CM | POA: Diagnosis not present

## 2016-07-22 LAB — LIPID PANEL
Cholesterol: 170 (ref 0–200)
HDL: 85 — AB (ref 35–70)
LDL Cholesterol: 65
Triglycerides: 112 (ref 40–160)

## 2016-07-23 ENCOUNTER — Encounter: Payer: Self-pay | Admitting: Family

## 2016-07-23 ENCOUNTER — Non-Acute Institutional Stay: Payer: Medicare Other | Admitting: Family

## 2016-07-23 ENCOUNTER — Other Ambulatory Visit: Payer: Self-pay

## 2016-07-23 DIAGNOSIS — L602 Onychogryphosis: Secondary | ICD-10-CM | POA: Diagnosis not present

## 2016-07-23 DIAGNOSIS — R739 Hyperglycemia, unspecified: Secondary | ICD-10-CM

## 2016-07-23 DIAGNOSIS — E039 Hypothyroidism, unspecified: Secondary | ICD-10-CM

## 2016-07-23 DIAGNOSIS — M7989 Other specified soft tissue disorders: Secondary | ICD-10-CM

## 2016-07-23 DIAGNOSIS — I482 Chronic atrial fibrillation: Secondary | ICD-10-CM | POA: Diagnosis not present

## 2016-07-23 DIAGNOSIS — R296 Repeated falls: Secondary | ICD-10-CM | POA: Diagnosis not present

## 2016-07-23 DIAGNOSIS — I519 Heart disease, unspecified: Secondary | ICD-10-CM

## 2016-07-23 DIAGNOSIS — R2681 Unsteadiness on feet: Secondary | ICD-10-CM | POA: Diagnosis not present

## 2016-07-23 DIAGNOSIS — E7800 Pure hypercholesterolemia, unspecified: Secondary | ICD-10-CM

## 2016-07-23 DIAGNOSIS — M799 Soft tissue disorder, unspecified: Secondary | ICD-10-CM

## 2016-07-23 DIAGNOSIS — Q6689 Other  specified congenital deformities of feet: Secondary | ICD-10-CM | POA: Diagnosis not present

## 2016-07-23 DIAGNOSIS — R634 Abnormal weight loss: Secondary | ICD-10-CM

## 2016-07-23 NOTE — Progress Notes (Signed)
Location:  [place] Room Number: 1 Place of Service:  ALF [phone]) Provider: Angle Dirusso FNP-C  Blanchie Serve, MD  Patient Care Team: Blanchie Serve, MD as PCP - General (Internal Medicine) Angelia Mould, MD as Attending Physician (Vascular Surgery) [name], MD as Attending Physician (Obstetrics and Gynecology) [name], MD (Orthopedic Surgery) [name], MD (Ophthalmology) [name], MD as Consulting Physician (Urology) [name], MD as Consulting Physician (Cardiology) [name], PA-C as Physician Assistant (Otolaryngology) [name], MD as Consulting Physician (Otolaryngology) [name], MD as Consulting Physician (Dermatology) [name], MD as Consulting Physician (Physical Medicine and Rehabilitation) [name], MD as Attending Physician (Gastroenterology) Mast, Man X, NP as Nurse Practitioner (Internal Medicine) Kathrynn Ducking, MD as Consulting Physician (Neurology) Debara Pickett Nadean Corwin, MD as Consulting Physician (Cardiology) Druscilla Brownie, MD as Consulting Physician (Dermatology)  Extended Emergency Contact Information Primary Emergency Contact: [place] of [place] Phone: [phone] Relation: Other Secondary Emergency Contact: Larina Bras [place] of [place] Phone: [phone] Mobile Phone: [phone] Relation: Son  Code Status:  Full code  Goals of care: Advanced Directive information Advanced Directives 07/23/2016  Does Patient Have a Medical Advance Directive? Yes  Type of Advance Directive [place]  Does patient want to make changes to medical advance directive? -  Copy of [place] in Chart? Yes  Would patient like information on creating a medical advance directive? -     Chief Complaint  Patient presents with  . Acute Visit    rash    HPI:  Pt is a 83 y.o. female seen today at  [facility] for an acute visit for evaluation of rash on left forearm.she is seen in her room today per facility Nurse request.she complains of rash on left elbow area states noticed it three days ago. she denies any fever,chills, itching, pain or bites to area.    Past Medical History:  Diagnosis Date  . Abdominal bloating   . Atrial fibrillation ([place])   . Bruises easily   . Carotid artery occlusion    LEFT  . Dizziness   . DOE (dyspnea on exertion) 04/02/2014  . Fall at home Sept 2013, Dec. 2013  Jun 08, 2012  . GERD (gastroesophageal reflux disease) 02/11/2015  . Headache(784.0)   . Hoarseness   . Hypercholesterolemia   . Hyperglycemia 04/02/2014   Glucose 178 mg percent on 01/22/2014 04/05/14 Hgb A1c 6.6 Diet controlled.    . Hypothyroidism 04/15/2015  . Neuropathy    PERIPHERAL  . Pruritus   . Scoliosis   . Varicose veins    Past Surgical History:  Procedure Laterality Date  . ABDOMINAL HYSTERECTOMY  1954  . CHOLECYSTECTOMY  1997  . SPINE SURGERY  1997   correct scoliosis    No Known Allergies  Allergies as of 07/23/2016   No Known Allergies     Medication List       Accurate as of 07/23/16  3:25 PM. Always use your most recent med list.          acetaminophen 500 MG tablet Commonly known as:  TYLENOL Take 500 mg by mouth. Take one tablet every 6 hours as needed   alendronate 70 MG tablet Commonly known as:  FOSAMAX Take 70 mg by mouth once a week. Take with a full glass of water on an empty stomach.   ALPRAZolam 0.25 MG tablet Commonly known as:  XANAX Take 0.25  mg by mouth. Take one tablet twice a day as needed for anxiety   aspirin 81 MG tablet Take 81 mg by mouth daily.   atenolol 25 MG tablet Commonly known as:  TENORMIN TAKE 1 TABLET TWICE DAILY.   Biotin 5000 MCG Caps Take 1 capsule by mouth daily.   CENTRUM ADULTS PO Take 1 tablet by mouth daily.   docusate sodium 100 MG capsule Commonly known as:  COLACE Take 1 capsule (100 mg  total) by mouth daily.   DULoxetine 20 MG capsule Commonly known as:  CYMBALTA Take 20 mg by mouth at bedtime.   OVER THE COUNTER MEDICATION Take 1 capsule by mouth daily. FD Gard   polyethylene glycol packet Commonly known as:  MIRALAX / GLYCOLAX Take 17 g by mouth daily.   ranitidine 75 MG/5ML syrup Commonly known as:  ZANTAC TAKE 2 TEASPOONFULS(10 MLS) TWICE DAILY.   simethicone 80 MG chewable tablet Commonly known as:  MYLICON Chew 80 mg by mouth 4 (four) times daily as needed for flatulence.   simvastatin 20 MG tablet Commonly known as:  ZOCOR Take 20 mg by mouth daily.   traMADol 50 MG tablet Commonly known as:  ULTRAM Take 50 mg by mouth every 6 (six) hours as needed for moderate pain.   VIACTIV MULTI-VITAMIN PO Take 1 tablet by mouth daily.       Review of Systems  Constitutional: Negative for activity change, appetite change, chills, fatigue and fever.  HENT: Negative for congestion, rhinorrhea, sinus pain, sinus pressure, sneezing and sore throat.   Eyes: Negative.   Respiratory: Negative for cough, chest tightness, shortness of breath and wheezing.   Cardiovascular: Negative for chest pain, palpitations and leg swelling.  Gastrointestinal: Negative for abdominal distention, abdominal pain, constipation, diarrhea, nausea and vomiting.  Musculoskeletal: Positive for back pain and gait problem.       Chronic pain   Skin: Positive for rash. Negative for color change and pallor.  Neurological: Negative for dizziness, seizures, syncope, light-headedness and headaches.  Psychiatric/Behavioral: Negative for agitation, confusion, hallucinations and sleep disturbance. The patient is not nervous/anxious.     Immunization History  Administered Date(s) Administered  . Influenza Split 10/20/2011, 11/03/2012  . Influenza,inj,Quad PF,36+ Mos 09/29/2012, 11/09/2013  . Influenza-Unspecified 10/17/2014, 10/23/2015  . PPD Test 08/21/2013, 03/04/2014  . Pneumococcal  Conjugate-13 08/21/2013  . Tdap 05/20/2008  . Zoster 11/01/2012   Pertinent  Health Maintenance Due  Topic Date Due  . DEXA SCAN  01/31/1987  . PNA vac Low Risk Adult (2 of 2 - PPSV23) 08/22/2014  . INFLUENZA VACCINE  08/11/2016   Fall Risk  05/31/2016 09/02/2015 04/29/2015 04/01/2015 02/11/2015  Falls in the past year? No No No No No  Number falls in past yr: - - - - -  Injury with Fall? - - - - -  Risk Factor Category  - - - - -  Risk for fall due to : - - - - -  Risk for fall due to (comments): - - - - -    Vitals:   07/23/16 1259  BP: (!) 110/54  Pulse: 70  Resp: 18  Temp: 97.9 F (36.6 C)  SpO2: 97%  Weight: 98 lb (44.5 kg)  Height: 5' (1.524 m)   Body mass index is 19.14 kg/m. Physical Exam  Constitutional: She is oriented to person, place, and time.  Pleasant Frail elderly in no acute distress   HENT:  Head: Normocephalic.  Mouth/Throat: Oropharynx is clear and moist. No  oropharyngeal exudate.  Eyes: Pupils are equal, round, and reactive to light. Conjunctivae and EOM are normal. Right eye exhibits no discharge. Left eye exhibits no discharge. No scleral icterus.  Neck: Normal range of motion. No JVD present. No thyromegaly present.  Cardiovascular: Intact distal pulses.  Exam reveals no gallop and no friction rub.   Murmur heard. Irregular Heart rate   Pulmonary/Chest: Effort normal and breath sounds normal. No respiratory distress. She has no wheezes. She has no rales. She exhibits no tenderness.  Abdominal: Soft. Bowel sounds are normal. She exhibits no distension. There is no tenderness. There is no rebound and no guarding.  Musculoskeletal: She exhibits no edema, tenderness or deformity.   Unsteady gait.   Lymphadenopathy:    She has no cervical adenopathy.  Neurological: She is oriented to person, place, and time.  Skin: Skin is warm and dry. No rash noted. No erythema. No pallor.  Left  Posterior forearm  X 3 raised, hard,movable non tender, 0.5 cm in size  lesion.other two smaller lesion noted on forearm.   Psychiatric: She has a normal mood and affect.    Labs reviewed:  Recent Labs  11/06/15 07/01/16  NA 138 138  K 4.5 4.0  BUN 16 12  CREATININE 0.8 0.8    Recent Labs  11/06/15 05/12/16 1304 07/01/16  AST 24  --  22  ALT 10  --  11  ALKPHOS 99  --  101  PROT  --  6.5  --     Recent Labs  11/06/15 07/01/16  WBC 6.9 9.6  HGB 12.1 12.9  HCT 37 39  PLT 270 296   Lab Results  Component Value Date   TSH 3.08 11/06/2015   Lab Results  Component Value Date   HGBA1C 6.4 07/07/2015   Lab Results  Component Value Date   CHOL 170 07/22/2016   HDL 85 (A) 07/22/2016   LDLCALC 65 07/22/2016   TRIG 112 07/22/2016   CHOLHDL 3 07/09/2013    Significant Diagnostic Results in last 30 days:  No results found.  Assessment/Plan Lesion  Afebrile.Left  Posterior forearm  X 3 raised, hard,movable non tender, 0.5 cm in size lesion.other two smaller lesion noted on forearm. Consult Dermatologist for evaluation.   Family/ staff Communication: Reviewed plan of care with patient and facility Nurse   Labs/tests ordered: None   [name] Taiylor Virden, NP

## 2016-07-27 DIAGNOSIS — R2681 Unsteadiness on feet: Secondary | ICD-10-CM | POA: Diagnosis not present

## 2016-07-27 DIAGNOSIS — R296 Repeated falls: Secondary | ICD-10-CM | POA: Diagnosis not present

## 2016-07-27 DIAGNOSIS — I482 Chronic atrial fibrillation: Secondary | ICD-10-CM | POA: Diagnosis not present

## 2016-07-29 ENCOUNTER — Encounter: Payer: Self-pay | Admitting: Family

## 2016-07-29 ENCOUNTER — Non-Acute Institutional Stay: Payer: Medicare Other | Admitting: Family

## 2016-07-29 DIAGNOSIS — I482 Chronic atrial fibrillation: Secondary | ICD-10-CM | POA: Diagnosis not present

## 2016-07-29 DIAGNOSIS — R739 Hyperglycemia, unspecified: Secondary | ICD-10-CM | POA: Diagnosis not present

## 2016-07-29 DIAGNOSIS — R2681 Unsteadiness on feet: Secondary | ICD-10-CM | POA: Diagnosis not present

## 2016-07-29 DIAGNOSIS — I519 Heart disease, unspecified: Secondary | ICD-10-CM | POA: Diagnosis not present

## 2016-07-29 DIAGNOSIS — E039 Hypothyroidism, unspecified: Secondary | ICD-10-CM | POA: Diagnosis not present

## 2016-07-29 DIAGNOSIS — E7800 Pure hypercholesterolemia, unspecified: Secondary | ICD-10-CM | POA: Diagnosis not present

## 2016-07-29 DIAGNOSIS — R296 Repeated falls: Secondary | ICD-10-CM | POA: Diagnosis not present

## 2016-07-29 DIAGNOSIS — K5901 Slow transit constipation: Secondary | ICD-10-CM | POA: Diagnosis not present

## 2016-07-29 DIAGNOSIS — R634 Abnormal weight loss: Secondary | ICD-10-CM | POA: Diagnosis not present

## 2016-07-29 LAB — COMPREHENSIVE METABOLIC PANEL
ALT: 9 U/L (ref 6–29)
AST: 19 U/L (ref 10–35)
Albumin: 3.2 g/dL — ABNORMAL LOW (ref 3.6–5.1)
Alkaline Phosphatase: 80 U/L (ref 33–130)
BUN: 17 mg/dL (ref 7–25)
CO2: 27 mmol/L (ref 20–31)
Calcium: 8.8 mg/dL (ref 8.6–10.4)
Chloride: 103 mmol/L (ref 98–110)
Creat: 0.85 mg/dL (ref 0.60–0.88)
Glucose, Bld: 82 mg/dL (ref 65–99)
Potassium: 4.2 mmol/L (ref 3.5–5.3)
Sodium: 139 mmol/L (ref 135–146)
Total Bilirubin: 0.6 mg/dL (ref 0.2–1.2)
Total Protein: 5.7 g/dL — ABNORMAL LOW (ref 6.1–8.1)

## 2016-07-29 LAB — LIPID PANEL
Cholesterol: 152 mg/dL (ref <200)
HDL: 74 mg/dL (ref >50)
LDL Cholesterol: 63 mg/dL (ref <100)
Total CHOL/HDL Ratio: 2.1 Ratio (ref <5.0)
Triglycerides: 77 mg/dL (ref <150)
VLDL: 15 mg/dL (ref <30)

## 2016-07-29 LAB — TSH: TSH: 1.59 mIU/L

## 2016-07-29 NOTE — Progress Notes (Signed)
Location:  [place] Room Number: [place] of Service:  ALF [phone]) Provider: Betzabe Bevans FNP-C  Blanchie Serve, MD  Patient Care Team: Blanchie Serve, MD as PCP - General (Internal Medicine) Angelia Mould, MD as Attending Physician (Vascular Surgery) [name], MD as Attending Physician (Obstetrics and Gynecology) [name], MD (Orthopedic Surgery) [name], MD (Ophthalmology) [name], MD as Consulting Physician (Urology) [name], MD as Consulting Physician (Cardiology) [name], PA-C as Physician Assistant (Otolaryngology) [name], MD as Consulting Physician (Otolaryngology) [name], MD as Consulting Physician (Dermatology) [name], MD as Consulting Physician (Physical Medicine and Rehabilitation) [name], MD as Attending Physician (Gastroenterology) Mast, Man X, NP as Nurse Practitioner (Internal Medicine) Kathrynn Ducking, MD as Consulting Physician (Neurology) Debara Pickett Nadean Corwin, MD as Consulting Physician (Cardiology) Druscilla Brownie, MD as Consulting Physician (Dermatology)  Extended Emergency Contact Information Primary Emergency Contact: [place] of [place] Phone: [phone] Relation: Other Secondary Emergency Contact: Larina Bras [place] of [place] Phone: [phone] Mobile Phone: [phone] Relation: Son  Code Status:  DNR Goals of care: Advanced Directive information Advanced Directives 07/29/2016  Does Patient Have a Medical Advance Directive? Yes  Type of Advance Directive [place]  Does patient want to make changes to medical advance directive? -  Copy of [place] in Chart? Yes  Would patient like information on creating a medical advance directive? -     Chief Complaint  Patient presents with  . Acute Visit    constipation    HPI:  Pt is a 83 y.o. female seen today at  [facility] for an acute visit for evaluation of constipation.She is seen in her room today per facility Nurse request.Nurse reports patient complaining of constipation. She states four days ago she was unable to move her bowels but disimpacted herself with small hard stool removed.The next day she also obtain another small hard stool.she was not taking her miralax and colace as directed. she was instructed by Nurse to take docusate and half dose of her miralax. She states had small soft bowel movement yesterday but thinks still constipated. She states has good appetite. She denies any abdominal pain,distension, nausea or vomiting.    Past Medical History:  Diagnosis Date  . Abdominal bloating   . Atrial fibrillation ([place])   . Bruises easily   . Carotid artery occlusion    LEFT  . Dizziness   . DOE (dyspnea on exertion) 04/02/2014  . Fall at home Sept 2013, Dec. 2013  Jun 08, 2012  . GERD (gastroesophageal reflux disease) 02/11/2015  . Headache(784.0)   . Hoarseness   . Hypercholesterolemia   . Hyperglycemia 04/02/2014   Glucose 178 mg percent on 01/22/2014 04/05/14 Hgb A1c 6.6 Diet controlled.    . Hypothyroidism 04/15/2015  . Neuropathy    PERIPHERAL  . Pruritus   . Scoliosis   . Varicose veins    Past Surgical History:  Procedure Laterality Date  . ABDOMINAL HYSTERECTOMY  1954  . CHOLECYSTECTOMY  1997  . SPINE SURGERY  1997   correct scoliosis    No Known Allergies  Allergies as of 07/29/2016   No Known Allergies     Medication List       Accurate as of 07/29/16  5:05 PM. Always use your most recent med list.          acetaminophen 500 MG tablet Commonly known as:  TYLENOL Take 500  mg by mouth. Take one tablet every 6 hours as needed   ALPRAZolam 0.25 MG tablet Commonly known as:  XANAX Take 0.25 mg by mouth. Take one tablet twice a day as needed for anxiety   aspirin 81 MG tablet Take 81 mg by mouth daily.   atenolol 25 MG tablet Commonly known as:   TENORMIN TAKE 1 TABLET TWICE DAILY.   Biotin 5000 MCG Caps Take 1 capsule by mouth daily.   CENTRUM ADULTS PO Take 1 tablet by mouth daily.   docusate sodium 100 MG capsule Commonly known as:  COLACE Take 1 capsule (100 mg total) by mouth daily.   DULoxetine 20 MG capsule Commonly known as:  CYMBALTA Take 20 mg by mouth at bedtime.   OVER THE COUNTER MEDICATION Take 1 capsule by mouth daily. FD Gard   polyethylene glycol packet Commonly known as:  MIRALAX / GLYCOLAX Take 17 g by mouth daily.   ranitidine 75 MG/5ML syrup Commonly known as:  ZANTAC TAKE 2 TEASPOONFULS(10 MLS) TWICE DAILY.   simethicone 80 MG chewable tablet Commonly known as:  MYLICON Chew 80 mg by mouth 4 (four) times daily as needed for flatulence.   simvastatin 20 MG tablet Commonly known as:  ZOCOR Take 20 mg by mouth daily.   traMADol 50 MG tablet Commonly known as:  ULTRAM Take 50 mg by mouth every 6 (six) hours as needed for moderate pain.   VIACTIV MULTI-VITAMIN PO Take 1 tablet by mouth daily.       Review of Systems  Constitutional: Negative for activity change, appetite change, chills, fatigue and fever.  Respiratory: Negative for cough, chest tightness, shortness of breath and wheezing.   Cardiovascular: Negative for chest pain, palpitations and leg swelling.  Gastrointestinal: Positive for constipation. Negative for abdominal distention, abdominal pain, diarrhea, nausea and vomiting.  Musculoskeletal: Positive for back pain and gait problem.       Chronic pain   Skin: Negative for color change, pallor and rash.  Neurological: Negative for dizziness, seizures, syncope, light-headedness and headaches.  Psychiatric/Behavioral: Negative for agitation, confusion, hallucinations and sleep disturbance. The patient is not nervous/anxious.     Immunization History  Administered Date(s) Administered  . Influenza Split 10/20/2011, 11/03/2012  . Influenza,inj,Quad PF,36+ Mos 09/29/2012,  11/09/2013  . Influenza-Unspecified 10/17/2014, 10/23/2015  . PPD Test 08/21/2013, 03/04/2014  . Pneumococcal Conjugate-13 08/21/2013  . Tdap 05/20/2008  . Zoster 11/01/2012   Pertinent  Health Maintenance Due  Topic Date Due  . DEXA SCAN  01/31/1987  . PNA vac Low Risk Adult (2 of 2 - PPSV23) 08/22/2014  . INFLUENZA VACCINE  08/11/2016   Fall Risk  05/31/2016 09/02/2015 04/29/2015 04/01/2015 02/11/2015  Falls in the past year? No No No No No  Number falls in past yr: - - - - -  Injury with Fall? - - - - -  Risk Factor Category  - - - - -  Risk for fall due to : - - - - -  Risk for fall due to (comments): - - - - -    Vitals:   07/29/16 1619  BP: 113/64  Pulse: 74  Resp: 20  Temp: 97.9 F (36.6 C)  SpO2: 90%  Weight: 98 lb (44.5 kg)  Height: 5' (1.524 m)   Body mass index is 19.14 kg/m. Physical Exam  Constitutional: She is oriented to person, place, and time.   Frail elderly in no acute distress   Neck: Normal range of motion. No JVD present.  No thyromegaly present.  Cardiovascular: Intact distal pulses.  Exam reveals no gallop and no friction rub.   Murmur heard. Irregular Heart rate   Pulmonary/Chest: Effort normal and breath sounds normal. No respiratory distress. She has no wheezes. She has no rales. She exhibits no tenderness.  Abdominal: Soft. Bowel sounds are normal. She exhibits no distension. There is no tenderness. There is no rebound and no guarding.  Musculoskeletal: She exhibits no edema, tenderness or deformity.   Unsteady gait.   Lymphadenopathy:    She has no cervical adenopathy.  Neurological: She is oriented to person, place, and time.  Skin: Skin is warm and dry. No rash noted. No erythema. No pallor.  Psychiatric: She has a normal mood and affect.    Labs reviewed:  Recent Labs  11/06/15 07/01/16 07/29/16 0740  NA 138 138 139  K 4.5 4.0 4.2  CL  --   --  103  CO2  --   --  27  GLUCOSE  --   --  82  BUN 16 12 17   CREATININE 0.8 0.8 0.85    CALCIUM  --   --  8.8    Recent Labs  11/06/15 05/12/16 1304 07/01/16 07/29/16 0740  AST 24  --  22 19  ALT 10  --  11 9  ALKPHOS 99  --  101 80  BILITOT  --   --   --  0.6  PROT  --  6.5  --  5.7*  ALBUMIN  --   --   --  3.2*    Recent Labs  11/06/15 07/01/16  WBC 6.9 9.6  HGB 12.1 12.9  HCT 37 39  PLT 270 296   Lab Results  Component Value Date   TSH 1.59 07/29/2016   Lab Results  Component Value Date   HGBA1C 6.4 07/07/2015   Lab Results  Component Value Date   CHOL 152 07/29/2016   HDL 74 07/29/2016   LDLCALC 63 07/29/2016   TRIG 77 07/29/2016   CHOLHDL 2.1 07/29/2016    Significant Diagnostic Results in last 30 days:  No results found.  Assessment/Plan  Slow transit constipation Has had hard stool x 3 days. Has not been taking miralax and docusate as directed. Recommend patient's medication to be administered by facility Nurse. Patient Nurse discussed concerns with patient's POA but states patient not willing for nurse to assist with medication. Patient instructed to restart Miralax daily. Dulcolax 5 mg Tablet one by mouth X 1 dose today. May repeat x 1 dose in the morning if no bowel movement.Patient verbalize understanding. Continue to monitor.   Family/ staff Communication: Reviewed plan of care with patient and facility Nurse supervisor  Labs/tests ordered: None   Sandrea Hughs, NP

## 2016-07-29 NOTE — Progress Notes (Signed)
Opened in error

## 2016-08-03 ENCOUNTER — Encounter: Payer: Self-pay | Admitting: Internal Medicine

## 2016-08-03 DIAGNOSIS — I482 Chronic atrial fibrillation: Secondary | ICD-10-CM | POA: Diagnosis not present

## 2016-08-03 DIAGNOSIS — R2681 Unsteadiness on feet: Secondary | ICD-10-CM | POA: Diagnosis not present

## 2016-08-03 DIAGNOSIS — R296 Repeated falls: Secondary | ICD-10-CM | POA: Diagnosis not present

## 2016-08-04 ENCOUNTER — Non-Acute Institutional Stay: Payer: Medicare Other | Admitting: Internal Medicine

## 2016-08-04 ENCOUNTER — Encounter: Payer: Self-pay | Admitting: Internal Medicine

## 2016-08-04 VITALS — BP 118/64 | HR 60 | Temp 97.6°F | Resp 16 | Ht 60.0 in | Wt 94.0 lb

## 2016-08-04 DIAGNOSIS — M5440 Lumbago with sciatica, unspecified side: Secondary | ICD-10-CM

## 2016-08-04 DIAGNOSIS — G8929 Other chronic pain: Secondary | ICD-10-CM

## 2016-08-04 DIAGNOSIS — H9193 Unspecified hearing loss, bilateral: Secondary | ICD-10-CM | POA: Diagnosis not present

## 2016-08-04 DIAGNOSIS — Z8781 Personal history of (healed) traumatic fracture: Secondary | ICD-10-CM | POA: Diagnosis not present

## 2016-08-04 DIAGNOSIS — M810 Age-related osteoporosis without current pathological fracture: Secondary | ICD-10-CM

## 2016-08-04 DIAGNOSIS — E7800 Pure hypercholesterolemia, unspecified: Secondary | ICD-10-CM | POA: Diagnosis not present

## 2016-08-04 DIAGNOSIS — R2681 Unsteadiness on feet: Secondary | ICD-10-CM

## 2016-08-04 DIAGNOSIS — I6523 Occlusion and stenosis of bilateral carotid arteries: Secondary | ICD-10-CM

## 2016-08-04 DIAGNOSIS — K5900 Constipation, unspecified: Secondary | ICD-10-CM

## 2016-08-04 DIAGNOSIS — G629 Polyneuropathy, unspecified: Secondary | ICD-10-CM | POA: Diagnosis not present

## 2016-08-04 DIAGNOSIS — F411 Generalized anxiety disorder: Secondary | ICD-10-CM | POA: Diagnosis not present

## 2016-08-04 DIAGNOSIS — I482 Chronic atrial fibrillation, unspecified: Secondary | ICD-10-CM

## 2016-08-04 DIAGNOSIS — I739 Peripheral vascular disease, unspecified: Secondary | ICD-10-CM

## 2016-08-04 DIAGNOSIS — R1314 Dysphagia, pharyngoesophageal phase: Secondary | ICD-10-CM

## 2016-08-04 MED ORDER — ALENDRONATE SODIUM 70 MG PO TABS: 70.0000 mg | ORAL_TABLET | ORAL | 11 refills | Status: DC

## 2016-08-04 MED ORDER — CALCIUM-VITAMIN D 600-400 MG-UNIT PO TABS: 1.0000 | ORAL_TABLET | Freq: Two times a day (BID) | ORAL | 3 refills | Status: DC

## 2016-08-04 NOTE — Progress Notes (Signed)
Location:  [facility] [place] [facility] Provider:    Blanchie Serve, MD  Patient Care Team: Blanchie Serve, MD as PCP - General (Internal Medicine) Angelia Mould, MD as Attending Physician (Vascular Surgery) [name], MD as Attending Physician (Obstetrics and Gynecology) [name], MD (Orthopedic Surgery) [name], MD (Ophthalmology) [name], MD as Consulting Physician (Urology) [name], MD as Consulting Physician (Cardiology) [name], PA-C as Physician Assistant (Otolaryngology) [name], MD as Consulting Physician (Otolaryngology) [name], MD as Consulting Physician (Dermatology) [name], MD as Consulting Physician (Physical Medicine and Rehabilitation) [name], MD as Attending Physician (Gastroenterology) Mast, Man X, NP as Nurse Practitioner (Internal Medicine) Kathrynn Ducking, MD as Consulting Physician (Neurology) Debara Pickett Nadean Corwin, MD as Consulting Physician (Cardiology) Druscilla Brownie, MD as Consulting Physician (Dermatology)  Extended Emergency Contact Information Primary Emergency Contact: [place] Phone: [phone] Relation: Other Secondary Emergency Contact: Larina Bras [place] of [place] Phone: [phone] Mobile Phone: [phone] Relation: Son  Code Status:  Full code  Goals of care: Advanced Directive information Advanced Directives 07/29/2016  Does Patient Have a Medical Advance Directive? Yes  Type of Advance Directive [place]  Does patient want to make changes to medical advance directive? -  Copy of [place] in Chart? Yes  Would patient like information on creating a medical advance directive? -     Chief Complaint  Patient presents with  . Medical Management of Chronic Issues    routine follow up visit, constipation    HPI:    Patient is a 83 y.o. female seen today for routine visit. She resides in assisted living and self administers her medication. She has some confusion about her medications and is not able to tell me clearly if she is taking some of her medications.   Constipation- she was constipated 2 weeks back and had to self disimpact herself. She has been taking miralax and this has helped some. Last bowel movement was 2 days back with a small loose bowel movement. Denies any blood in stool. Denies any abdominal bloating or abdominal pain. Denies nausea or vomiting. Not taking colace.   afib- controlled, occasional palpitation, on atenolol and aspirin  Unsteady gait- With frequent falls. Supposed to wear ted hose, does not have one at present  Varicose veins- follows with vascular team.   Hyperlipidemia- currently on simvastatin  Rib fracture- denies pain this visit  Chronic back pain- follows with Dr Nelva Bush, currently on prn tramadol but has not required it. She has history of spinal stenosis and is s/p lumbosacral fusion. Has neuropathy. Also has chronic central osteoporotic compression of T5-T12 vertebral bodies. Imaging also showed narrowing at T12-L1 and L1-L2.   Severe osteoporosis- high risk for pathological fracture with high fall risk  Peripheral neuropathy- Currently on low dose cymbalta per neurology recommendations but does not think she is taking it.   Anxiety- stable per pt, on prn xanax and has not required it for several months.    Past Medical History:  Diagnosis Date  . Abdominal bloating   . Atrial fibrillation ([place])   . Bruises easily   . Carotid artery occlusion    LEFT  . Dizziness   . DOE (dyspnea on exertion) 04/02/2014  . Fall at home Sept 2013, Dec. 2013  Jun 08, 2012  . GERD (gastroesophageal reflux disease) 02/11/2015  . Headache(784.0)   .  Hoarseness   . Hypercholesterolemia   . Hyperglycemia 04/02/2014   Glucose 178 mg percent on 01/22/2014 04/05/14 Hgb A1c 6.6  Diet controlled.    . Hypothyroidism 04/15/2015  . Neuropathy    PERIPHERAL  . Pruritus   . Scoliosis   . Varicose veins    Past Surgical History:  Procedure Laterality Date  . ABDOMINAL HYSTERECTOMY  1954  . CHOLECYSTECTOMY  1997  . SPINE SURGERY  1997   correct scoliosis    No Known Allergies  Outpatient Encounter Prescriptions as of 08/04/2016  Medication Sig  . acetaminophen (TYLENOL) 500 MG tablet Take 500 mg by mouth. Take one tablet every 6 hours as needed  . aspirin 81 MG tablet Take 81 mg by mouth daily.  Marland Kitchen atenolol (TENORMIN) 25 MG tablet TAKE 1 TABLET TWICE DAILY.  Marland Kitchen Biotin 5000 MCG CAPS Take 1 capsule by mouth daily.  . DULoxetine (CYMBALTA) 20 MG capsule Take 20 mg by mouth at bedtime.  . Multiple Vitamins-Calcium (VIACTIV MULTI-VITAMIN PO) Take 1 tablet by mouth daily.  . Multiple Vitamins-Minerals (CENTRUM ADULTS PO) Take 1 tablet by mouth daily.  Marland Kitchen OVER THE COUNTER MEDICATION Take 1 capsule by mouth daily. FD Donald Prose  . polyethylene glycol (MIRALAX / GLYCOLAX) packet Take 17 g by mouth daily.  . ranitidine (ZANTAC) 75 MG/5ML syrup Take 10 mLs by mouth 2 (two) times daily.  . simvastatin (ZOCOR) 20 MG tablet Take 20 mg by mouth daily.  . traMADol (ULTRAM) 50 MG tablet Take 50 mg by mouth every 6 (six) hours as needed for moderate pain.  . simethicone (MYLICON) 80 MG chewable tablet Chew 80 mg by mouth 4 (four) times daily as needed for flatulence.  . [DISCONTINUED] ALPRAZolam (XANAX) 0.25 MG tablet Take 0.25 mg by mouth. Take one tablet twice a day as needed for anxiety  . [DISCONTINUED] docusate sodium (COLACE) 100 MG capsule Take 1 capsule (100 mg total) by mouth daily. (Patient not taking: Reported on 08/04/2016)  . [DISCONTINUED] ranitidine (ZANTAC) 75 MG/5ML syrup TAKE 2 TEASPOONFULS(10 MLS) TWICE DAILY. (Patient not taking: Reported on 08/04/2016)   No facility-administered encounter medications on file as of 08/04/2016.     Review of Systems  Constitutional:  Negative for activity change, appetite change, chills, diaphoresis and fever.  HENT: Positive for hearing loss and trouble swallowing. Negative for congestion, ear discharge, ear pain, mouth sores, nosebleeds, rhinorrhea, sinus pain and sore throat.   Eyes: Negative for pain and visual disturbance.       Has glasses  Respiratory: Negative for cough, shortness of breath and wheezing.   Cardiovascular: Positive for palpitations. Negative for chest pain and leg swelling.  Gastrointestinal: Positive for constipation. Negative for abdominal pain, blood in stool, nausea and vomiting.  Genitourinary: Negative for dysuria, flank pain and hematuria.  Musculoskeletal: Positive for back pain and gait problem. Negative for joint swelling.       Chronic low back pain s/p back fusion surgery  Skin: Negative for pallor and wound.  Neurological: Negative for dizziness, tremors, seizures, light-headedness, numbness and headaches.  Psychiatric/Behavioral: Positive for confusion. Negative for behavioral problems and sleep disturbance.    Immunization History  Administered Date(s) Administered  . Influenza Split 10/20/2011, 11/03/2012  . Influenza,inj,Quad PF,36+ Mos 09/29/2012, 11/09/2013  . Influenza-Unspecified 10/17/2014, 10/23/2015  . PPD Test 08/21/2013, 03/04/2014  . Pneumococcal Conjugate-13 08/21/2013  . Tdap 05/20/2008  . Zoster 11/01/2012   Pertinent  Health Maintenance Due  Topic Date Due  . DEXA SCAN  01/11/2017 (Originally  01/31/1987)  . PNA vac Low Risk Adult (2 of 2 - PPSV23) 01/11/2017 (Originally 08/22/2014)  . INFLUENZA VACCINE  08/11/2016   Fall Risk  05/31/2016 09/02/2015 04/29/2015 04/01/2015 02/11/2015  Falls in the past year? No No No No No  Number falls in past yr: - - - - -  Injury with Fall? - - - - -  Risk Factor Category  - - - - -  Risk for fall due to : - - - - -  Risk for fall due to (comments): - - - - -   Functional Status Survey:    Vitals:   08/04/16 1052  BP:  118/64  Pulse: 60  Resp: 16  Temp: 97.6 F (36.4 C)  TempSrc: Oral  SpO2: 91%  Weight: 94 lb (42.6 kg)  Height: 5' (1.524 m)   Body mass index is 18.36 kg/m. Physical Exam  Constitutional: She is oriented to person, place, and time. No distress.  Elderly female, thin built, frail  HENT:  Head: Normocephalic and atraumatic.  Mouth/Throat: Oropharynx is clear and moist. No oropharyngeal exudate.  Eyes: Pupils are equal, round, and reactive to light. Conjunctivae are normal. Right eye exhibits no discharge. Left eye exhibits no discharge.  Neck: Normal range of motion. Neck supple. No tracheal deviation present.  Cardiovascular:  Irregular heart rate  Pulmonary/Chest: Effort normal and breath sounds normal. She exhibits no tenderness.  Left chest wall tenderness  Abdominal: Soft. Bowel sounds are normal. She exhibits no distension. There is no tenderness. There is no rebound and no guarding.  Musculoskeletal:  Able to move all 4 extremities, left paraspinal tenderness, uses a walker, broad based gait, no leg edema, has varicose veins  Lymphadenopathy:    She has no cervical adenopathy.  Neurological: She is alert and oriented to person, place, and time.  Skin: Skin is warm and dry. She is not diaphoretic.  Easy bruising  Psychiatric: She has a normal mood and affect.  Slow with response to few questions and needs re-direction     Labs reviewed:  Recent Labs  11/06/15 07/01/16 07/29/16 0740  NA 138 138 139  K 4.5 4.0 4.2  CL  --   --  103  CO2  --   --  27  GLUCOSE  --   --  82  BUN 16 12 17   CREATININE 0.8 0.8 0.85  CALCIUM  --   --  8.8    Recent Labs  11/06/15 05/12/16 1304 07/01/16 07/29/16 0740  AST 24  --  22 19  ALT 10  --  11 9  ALKPHOS 99  --  101 80  BILITOT  --   --   --  0.6  PROT  --  6.5  --  5.7*  ALBUMIN  --   --   --  3.2*    Recent Labs  11/06/15 07/01/16  WBC 6.9 9.6  HGB 12.1 12.9  HCT 37 39  PLT 270 296   Lab Results  Component  Value Date   TSH 1.59 07/29/2016   Lab Results  Component Value Date   HGBA1C 6.4 07/07/2015   Lab Results  Component Value Date   CHOL 152 07/29/2016   HDL 74 07/29/2016   LDLCALC 63 07/29/2016   TRIG 77 07/29/2016   CHOLHDL 2.1 07/29/2016    Significant Diagnostic Results in last 30 days:  No results found.  Assessment/Plan  1. Constipation, unspecified constipation type Currently on miralax daily with colace as  needed. Has loose stool with miralax. Advised on taking prune juice daily. Start colace 100 mg daily and change miralax to every 3 days for now and monitor. Encouraged hydration.   2. Chronic atrial fibrillation (HCC) Controlled HR, continue atenolol and aspirin  3. PVD (peripheral vascular disease) ([place]) With varicose veins, advised to have compression stockings   4. Dysphagia, pharyngoesophageal phase Denies heartburn, tolerating po intake fine, aspiration precautions and continue ranitidine  5. Neuropathy Confirmed on her taking cymbalta, pt to continue  6. Bilateral hearing loss, unspecified hearing loss type Age related, does not wear hearing aid, supportive care  7. Hypercholesterolemia Continue simvastatin, reviewed lipid panel  8. Chronic bilateral low back pain with sciatica, sciatica laterality unspecified Continue prn tramadol for pain, fall and back precautions, to use her walker for transfer  9. Anxiety state Stable, has not required her xanax, d/c xanax for non use  10. Unsteady gait Fall risk, precautions to be taken  11. History of rib fracture Denies pain, breathing stable, monitor  12. Age-related osteoporosis without current pathological fracture To start fosamax 70 mg once a week with calcium-vitamin d supplement   Family/ staff Communication: reviewed care plan with patient and charge nurse.    Labs/tests ordered:  none    Blanchie Serve, MD Internal Medicine [facility] [address] [place], [place] [address] Cell Phone (Monday-Friday 8 am - 5 pm): [phone] On Call: [phone] and follow prompts after 5 pm and on weekends Office Phone: [phone] Office Fax: [phone]

## 2016-08-05 ENCOUNTER — Encounter: Payer: Medicare Other | Admitting: Internal Medicine

## 2016-08-06 DIAGNOSIS — R296 Repeated falls: Secondary | ICD-10-CM | POA: Diagnosis not present

## 2016-08-06 DIAGNOSIS — I482 Chronic atrial fibrillation: Secondary | ICD-10-CM | POA: Diagnosis not present

## 2016-08-06 DIAGNOSIS — R2681 Unsteadiness on feet: Secondary | ICD-10-CM | POA: Diagnosis not present

## 2016-08-09 ENCOUNTER — Ambulatory Visit: Payer: Medicare Other | Admitting: Gastroenterology

## 2016-08-10 DIAGNOSIS — R296 Repeated falls: Secondary | ICD-10-CM | POA: Diagnosis not present

## 2016-08-10 DIAGNOSIS — I482 Chronic atrial fibrillation: Secondary | ICD-10-CM | POA: Diagnosis not present

## 2016-08-10 DIAGNOSIS — R2681 Unsteadiness on feet: Secondary | ICD-10-CM | POA: Diagnosis not present

## 2016-08-17 ENCOUNTER — Non-Acute Institutional Stay: Payer: Medicare Other | Admitting: Family

## 2016-08-17 ENCOUNTER — Encounter: Payer: Self-pay | Admitting: Family

## 2016-08-17 DIAGNOSIS — R1314 Dysphagia, pharyngoesophageal phase: Secondary | ICD-10-CM | POA: Diagnosis not present

## 2016-08-17 DIAGNOSIS — H9193 Unspecified hearing loss, bilateral: Secondary | ICD-10-CM | POA: Diagnosis not present

## 2016-08-17 DIAGNOSIS — R296 Repeated falls: Secondary | ICD-10-CM | POA: Diagnosis not present

## 2016-08-17 DIAGNOSIS — G629 Polyneuropathy, unspecified: Secondary | ICD-10-CM

## 2016-08-17 DIAGNOSIS — R2681 Unsteadiness on feet: Secondary | ICD-10-CM | POA: Diagnosis not present

## 2016-08-17 DIAGNOSIS — M6281 Muscle weakness (generalized): Secondary | ICD-10-CM | POA: Diagnosis not present

## 2016-08-17 NOTE — Progress Notes (Signed)
Location:  [facility] Room Number: 1 Place of Service:  ALF [phone]) Provider: Teo Moede FNP-C   Blanchie Serve, MD  Patient Care Team: Blanchie Serve, MD as PCP - General (Internal Medicine) Angelia Mould, MD as Attending Physician (Vascular Surgery) [name], MD as Attending Physician (Obstetrics and Gynecology) [name], MD (Orthopedic Surgery) [name], MD (Ophthalmology) [name], MD as Consulting Physician (Urology) [name], MD as Consulting Physician (Cardiology) [name], PA-C as Physician Assistant (Otolaryngology) [name], MD as Consulting Physician (Otolaryngology) [name], MD as Consulting Physician (Dermatology) [name], MD as Consulting Physician (Physical Medicine and Rehabilitation) [name], MD as Attending Physician (Gastroenterology) Mast, Man X, NP as Nurse Practitioner (Internal Medicine) Kathrynn Ducking, MD as Consulting Physician (Neurology) Debara Pickett Nadean Corwin, MD as Consulting Physician (Cardiology) Druscilla Brownie, MD as Consulting Physician (Dermatology)  Extended Emergency Contact Information Primary Emergency Contact: [place] Phone: [phone] Relation: Other Secondary Emergency Contact: Larina Bras [place] Phone: [phone] Mobile Phone: [phone] Relation: Son  Code Status:  Full code  Goals of care: Advanced Directive information Advanced Directives 08/17/2016  Does Patient Have a Medical Advance Directive? Yes  Type of Advance Directive [place]  Does patient want to make changes to medical advance directive? -  Copy of [place] in Chart? Yes  Would patient like information on creating a medical advance directive? -     Chief Complaint  Patient presents with  . Acute Visit    multiple concerns (" clicking " in ears/head, thigh pain,  cough ).     HPI:  Pt is a 83 y.o. female seen today [facility] for acute evaluation of multiple concerns. She is seen in her room today. She complains of feeling clicking noises in both ears and head.reported to facility Nurse but no clicking sounds noted. She also complains of burning sensation on anterior thigh area down to calf muscle.she has an upcoming appointment with Vascular/Neurology 09/15/2016. Also states has booked an appointment with ENT Dr. Velva Harman 09/25/2016 for worsening hearing loss. She reports worsening swallowing with coughing at times. Voices also worsening though states has upcoming appointment with Gastroenterology.she denies any fever, chills, nausea or vomiting.      Past Medical History:  Diagnosis Date  . Abdominal bloating   . Atrial fibrillation ([place])   . Bruises easily   . Carotid artery occlusion    LEFT  . Dizziness   . DOE (dyspnea on exertion) 04/02/2014  . Fall at home Sept 2013, Dec. 2013  Jun 08, 2012  . GERD (gastroesophageal reflux disease) 02/11/2015  . Headache(784.0)   . Hoarseness   . Hypercholesterolemia   . Hyperglycemia 04/02/2014   Glucose 178 mg percent on 01/22/2014 04/05/14 Hgb A1c 6.6 Diet controlled.    . Hypothyroidism 04/15/2015  . Neuropathy    PERIPHERAL  . Pruritus   . Scoliosis   . Varicose veins    Past Surgical History:  Procedure Laterality Date  . ABDOMINAL HYSTERECTOMY  1954  . CHOLECYSTECTOMY  1997  . SPINE SURGERY  1997   correct scoliosis    No Known Allergies  Allergies as of 08/17/2016   No Known Allergies     Medication List       Accurate as of 08/17/16 10:40 PM. Always use your most recent med list.          acetaminophen 500 MG tablet  Commonly known as:  TYLENOL Take 500 mg by mouth. Take one tablet every 6 hours as needed   alendronate 70 MG tablet Commonly known as:  FOSAMAX Take 1 tablet (70 mg total) by mouth every 7 (seven) days. Take with a full glass of water on an empty stomach.     aspirin 81 MG tablet Take 81 mg by mouth daily.   atenolol 25 MG tablet Commonly known as:  TENORMIN TAKE 1 TABLET TWICE DAILY.   Biotin 5000 MCG Caps Take 1 capsule by mouth daily.   Calcium-Vitamin D 600-400 MG-UNIT Tabs Take 1 tablet by mouth 2 (two) times daily.   CENTRUM ADULTS PO Take 1 tablet by mouth daily.   docusate sodium 100 MG capsule Commonly known as:  COLACE Take 100 mg by mouth daily.   DULoxetine 20 MG capsule Commonly known as:  CYMBALTA Take 20 mg by mouth at bedtime.   OVER THE COUNTER MEDICATION Take 1 capsule by mouth daily. FD Gard   polyethylene glycol packet Commonly known as:  MIRALAX / GLYCOLAX Take 17 g by mouth daily as needed.   ranitidine 75 MG/5ML syrup Commonly known as:  ZANTAC Take 10 mLs by mouth 2 (two) times daily.   simethicone 80 MG chewable tablet Commonly known as:  MYLICON Chew 80 mg by mouth 4 (four) times daily as needed for flatulence.   simvastatin 20 MG tablet Commonly known as:  ZOCOR Take 20 mg by mouth daily.   traMADol 50 MG tablet Commonly known as:  ULTRAM Take 50 mg by mouth every 6 (six) hours as needed for moderate pain.   VIACTIV MULTI-VITAMIN PO Take 1 tablet by mouth daily.       Review of Systems  Constitutional: Negative for activity change, appetite change, chills, fatigue and fever.  HENT: Positive for hearing loss and trouble swallowing. Negative for congestion, dental problem, rhinorrhea, sinus pain, sinus pressure, sneezing and sore throat.   Eyes: Negative for pain, discharge, redness and itching.  Respiratory: Negative for cough, chest tightness, shortness of breath and wheezing.   Cardiovascular: Negative for chest pain, palpitations and leg swelling.  Gastrointestinal: Positive for constipation. Negative for abdominal distention, abdominal pain, diarrhea, nausea and vomiting.  Skin: Negative for color change, pallor and rash.  Neurological: Negative for dizziness, seizures, syncope,  light-headedness and headaches.  Psychiatric/Behavioral: Negative for agitation, confusion, hallucinations and sleep disturbance. The patient is not nervous/anxious.     Immunization History  Administered Date(s) Administered  . Influenza Split 10/20/2011, 11/03/2012  . Influenza,inj,Quad PF,36+ Mos 09/29/2012, 11/09/2013  . Influenza-Unspecified 10/17/2014, 10/23/2015  . PPD Test 08/21/2013, 03/04/2014  . Pneumococcal Conjugate-13 08/21/2013  . Tdap 05/20/2008  . Zoster 11/01/2012   Pertinent  Health Maintenance Due  Topic Date Due  . INFLUENZA VACCINE  08/11/2016  . DEXA SCAN  01/11/2017 (Originally 01/31/1987)  . PNA vac Low Risk Adult (2 of 2 - PPSV23) 01/11/2017 (Originally 08/22/2014)   Fall Risk  05/31/2016 09/02/2015 04/29/2015 04/01/2015 02/11/2015  Falls in the past year? No No No No No  Number falls in past yr: - - - - -  Comment - - - - -  Injury with Fall? - - - - -  Risk Factor Category  - - - - -  Risk for fall due to : - - - - -  Risk for fall due to: Comment - - - - -    Vitals:   08/17/16 1101  BP: 104/68  Pulse: 64  Resp: 19  Temp: 97.8 F (36.6 C)  SpO2: 95%  Weight: 91 lb 9.6 oz (41.5 kg)  Height: 5' (1.524 m)   Body mass index is 17.89 kg/m. Physical Exam  Constitutional: She is oriented to person, place, and time.  Thin Frail elderly in no acute distress   HENT:  Head: Normocephalic.  Left Ear: External ear normal.  Mouth/Throat: Oropharynx is clear and moist. No oropharyngeal exudate.  Right ear cannula with moderate cerumen.   Neck: Normal range of motion. No JVD present. No thyromegaly present.  Cardiovascular: Intact distal pulses.  Exam reveals no gallop and no friction rub.   Murmur heard. Irregular Heart rate   Pulmonary/Chest: Effort normal and breath sounds normal. No respiratory distress. She has no wheezes. She has no rales. She exhibits no tenderness.  Abdominal: Soft. Bowel sounds are normal. She exhibits no distension. There is no  tenderness. There is no rebound and no guarding.  Musculoskeletal: She exhibits no edema, tenderness or deformity.   Unsteady gait uses FWW but working with PT for right hand cane.    Lymphadenopathy:    She has no cervical adenopathy.  Neurological: She is oriented to person, place, and time.  Skin: Skin is warm and dry. No rash noted. No erythema. No pallor.  Psychiatric: She has a normal mood and affect.    Labs reviewed:  Recent Labs  11/06/15 07/01/16 07/29/16 0740  NA 138 138 139  K 4.5 4.0 4.2  CL  --   --  103  CO2  --   --  27  GLUCOSE  --   --  82  BUN 16 12 17   CREATININE 0.8 0.8 0.85  CALCIUM  --   --  8.8    Recent Labs  11/06/15 05/12/16 1304 07/01/16 07/29/16 0740  AST 24  --  22 19  ALT 10  --  11 9  ALKPHOS 99  --  101 80  BILITOT  --   --   --  0.6  PROT  --  6.5  --  5.7*  ALBUMIN  --   --   --  3.2*    Recent Labs  11/06/15 07/01/16  WBC 6.9 9.6  HGB 12.1 12.9  HCT 37 39  PLT 270 296   Lab Results  Component Value Date   TSH 1.59 07/29/2016   Lab Results  Component Value Date   HGBA1C 6.4 07/07/2015   Lab Results  Component Value Date   CHOL 152 07/29/2016   HDL 74 07/29/2016   LDLCALC 63 07/29/2016   TRIG 77 07/29/2016   CHOLHDL 2.1 07/29/2016    Significant Diagnostic Results in last 30 days:  No results found.  Assessment/Plan 1. Bilateral hearing loss, unspecified hearing loss type Worsening hearing loss with " clicking sensation". Has upcoming appointment with ENT Dr. Velva Harman 09/25/2016.follow up appointment as directed.   2. Neuropathy Right thigh burning sensation on anterior thigh area down to calf muscle.she has an upcoming appointment with Vascular/Neurology 09/15/2016.continue to monitor.   3. Dysphagia, pharyngoesophageal phase Reports coughing and choking at times with meals. Speech therapy to evaluate and treat as indicated. Follow up appointment with ENT and GI as directed.   Family/ staff Communication: Reviewed  plan of care with patient and facility Nurse.   Labs/tests ordered: None   Daishawn Lauf C Jencarlo Bonadonna, NP

## 2016-08-19 DIAGNOSIS — R296 Repeated falls: Secondary | ICD-10-CM | POA: Diagnosis not present

## 2016-08-19 DIAGNOSIS — R2681 Unsteadiness on feet: Secondary | ICD-10-CM | POA: Diagnosis not present

## 2016-08-19 DIAGNOSIS — M6281 Muscle weakness (generalized): Secondary | ICD-10-CM | POA: Diagnosis not present

## 2016-08-24 DIAGNOSIS — M6281 Muscle weakness (generalized): Secondary | ICD-10-CM | POA: Diagnosis not present

## 2016-08-24 DIAGNOSIS — R2681 Unsteadiness on feet: Secondary | ICD-10-CM | POA: Diagnosis not present

## 2016-08-24 DIAGNOSIS — R296 Repeated falls: Secondary | ICD-10-CM | POA: Diagnosis not present

## 2016-08-25 ENCOUNTER — Non-Acute Institutional Stay: Payer: Medicare Other

## 2016-08-25 DIAGNOSIS — H838X3 Other specified diseases of inner ear, bilateral: Secondary | ICD-10-CM | POA: Diagnosis not present

## 2016-08-25 DIAGNOSIS — H6121 Impacted cerumen, right ear: Secondary | ICD-10-CM | POA: Diagnosis not present

## 2016-08-25 DIAGNOSIS — H903 Sensorineural hearing loss, bilateral: Secondary | ICD-10-CM | POA: Diagnosis not present

## 2016-08-25 DIAGNOSIS — Z0000 Encounter for general adult medical examination without abnormal findings: Secondary | ICD-10-CM

## 2016-08-25 NOTE — Progress Notes (Signed)
Subjective:   Vanessa [name] is a 83 y.o. female who presents for Medicare Annual (Subsequent) preventive examination at [facility]-08/21/13    Objective:     Vitals: BP 120/60 (BP Location: Left Arm, Patient Position: Sitting)   Pulse (!) 50   Temp 98 F (36.7 C) (Oral)   Ht 5' (1.524 m)   Wt 92 lb (41.7 kg)   SpO2 98%   BMI 17.97 kg/m   Body mass index is 17.97 kg/m.   Tobacco History  Smoking Status  . Never Smoker  Smokeless Tobacco  . Never Used     Counseling given: Not Answered   Past Medical History:  Diagnosis Date  . Abdominal bloating   . Atrial fibrillation ([place])   . Bruises easily   . Carotid artery occlusion    LEFT  . Dizziness   . DOE (dyspnea on exertion) 04/02/2014  . Fall at home Sept 2013, Dec. 2013  Jun 08, 2012  . GERD (gastroesophageal reflux disease) 02/11/2015  . Headache(784.0)   . Hoarseness   . Hypercholesterolemia   . Hyperglycemia 04/02/2014   Glucose 178 mg percent on 01/22/2014 04/05/14 Hgb A1c 6.6 Diet controlled.    . Hypothyroidism 04/15/2015  . Neuropathy    PERIPHERAL  . Pruritus   . Scoliosis   . Varicose veins    Past Surgical History:  Procedure Laterality Date  . ABDOMINAL HYSTERECTOMY  1954  . CHOLECYSTECTOMY  1997  . SPINE SURGERY  1997   correct scoliosis   Family History  Problem Relation Age of Onset  . Adopted: Yes  . Diabetes Mother   . Heart attack Mother   . Heart disease Mother        After age 70  . Heart attack Father   . Stroke Father   . Breast cancer Sister   . Heart disease Maternal Grandmother   . Cancer Son        adopted son. esophagus, stomach, liver  . Stomach cancer Neg Hx   . Colon cancer Neg Hx    History  Sexual Activity  . Sexual activity: No    Outpatient Encounter Prescriptions as of 08/25/2016  Medication Sig  . acetaminophen (TYLENOL) 500 MG tablet Take 500 mg by mouth. Take one tablet every 6 hours as needed  . alendronate  (FOSAMAX) 70 MG tablet Take 1 tablet (70 mg total) by mouth every 7 (seven) days. Take with a full glass of water on an empty stomach.  Marland Kitchen aspirin 81 MG tablet Take 81 mg by mouth daily.  Marland Kitchen atenolol (TENORMIN) 25 MG tablet TAKE 1 TABLET TWICE DAILY.  Marland Kitchen Biotin 5000 MCG CAPS Take 1 capsule by mouth daily.  Marland Kitchen BISACODYL PO Take 5 mg by mouth daily as needed.  . Calcium Carb-Cholecalciferol (CALCIUM-VITAMIN D) 600-400 MG-UNIT TABS Take 1 tablet by mouth 2 (two) times daily.  Marland Kitchen docusate sodium (COLACE) 100 MG capsule Take 100 mg by mouth daily.  . DULoxetine (CYMBALTA) 20 MG capsule Take 20 mg by mouth at bedtime.  . Multiple Vitamins-Calcium (VIACTIV MULTI-VITAMIN PO) Take 1 tablet by mouth daily.  . Multiple Vitamins-Minerals (CENTRUM ADULTS PO) Take 1 tablet by mouth daily.  Marland Kitchen OVER THE COUNTER MEDICATION Take 1 capsule by mouth daily. FD Donald Prose  . polyethylene glycol (MIRALAX / GLYCOLAX) packet Take 17 g by mouth daily as needed.   . ranitidine (ZANTAC) 75 MG/5ML syrup Take 10 mLs by mouth 2 (two) times  daily.  . simethicone (MYLICON) 80 MG chewable tablet Chew 80 mg by mouth 4 (four) times daily as needed for flatulence.  . simvastatin (ZOCOR) 20 MG tablet Take 20 mg by mouth daily.  . traMADol (ULTRAM) 50 MG tablet Take 50 mg by mouth every 6 (six) hours as needed for moderate pain.   No facility-administered encounter medications on file as of 08/25/2016.     Activities of Daily Living In your present state of health, do you have any difficulty performing the following activities: 08/25/2016  Hearing? N  Vision? N  Difficulty concentrating or making decisions? N  Walking or climbing stairs? Y  Dressing or bathing? N  Doing errands, shopping? N  Preparing Food and eating ? N  Using the Toilet? N  In the past six months, have you accidently leaked urine? N  Do you have problems with loss of bowel control? N  Managing your Medications? Y  Managing your Finances? Y  Housekeeping or managing  your Housekeeping? Y  Some recent data might be hidden    Patient Care Team: Blanchie Serve, MD as PCP - General (Internal Medicine) Angelia Mould, MD as Attending Physician (Vascular Surgery) [name], MD as Attending Physician (Obstetrics and Gynecology) [name], MD (Orthopedic Surgery) [name], MD (Ophthalmology) [name], MD as Consulting Physician (Urology) [name], MD as Consulting Physician (Cardiology) [name], PA-C as Physician Assistant (Otolaryngology) [name], MD as Consulting Physician (Otolaryngology) [name], MD as Consulting Physician (Dermatology) [name], MD as Consulting Physician (Physical Medicine and Rehabilitation) [name], MD as Attending Physician (Gastroenterology) Mast, Man X, NP as Nurse Practitioner (Internal Medicine) Kathrynn Ducking, MD as Consulting Physician (Neurology) Debara Pickett Nadean Corwin, MD as Consulting Physician (Cardiology) Druscilla Brownie, MD as Consulting Physician (Dermatology)    Assessment:     Exercise Activities and Dietary recommendations Current Exercise Habits: Structured exercise class, Type of exercise: Other - see comments (physical therapy), Time (Minutes): > 60, Frequency (Times/Week): 3, Weekly Exercise (Minutes/Week): 0, Intensity: Mild, Exercise limited by: None identified  Goals    None     Fall Risk Fall Risk  08/25/2016 05/31/2016 09/02/2015 04/29/2015 04/01/2015  Falls in the past year? Yes No No No No  Number falls in past yr: 2 or more - - - -  Comment - - - - -  Injury with Fall? Yes - - - -  Risk Factor Category  - - - - -  Risk for fall due to : - - - - -  Risk for fall due to: Comment - - - - -   Depression Screen PHQ 2/9 Scores 08/25/2016 05/31/2016 09/02/2015 04/29/2015  PHQ - 2 Score 0 0 3 0  PHQ- 9 Score - - - -     Cognitive Function MMSE - Mini Mental State Exam 08/25/2016 09/02/2015  Orientation to time 5 5  Orientation to Place 5 5   Registration 3 3  Attention/ Calculation 5 5  Recall 2 2  Language- name 2 objects 2 2  Language- repeat 1 1  Language- follow 3 step command 3 3  Language- read & follow direction 1 1  Write a sentence 1 1  Copy design 1 1  Total score 29 29        Immunization History  Administered Date(s) Administered  . Influenza Split 10/20/2011, 11/03/2012  . Influenza,inj,Quad PF,36+ Mos 09/29/2012, 11/09/2013  . Influenza-Unspecified 10/17/2014, 10/23/2015  . PPD Test 08/21/2013, 03/04/2014  . Pneumococcal Conjugate-13  08/21/2013  . Tdap 05/20/2008  . Zoster 11/01/2012   Screening Tests Health Maintenance  Topic Date Due  . INFLUENZA VACCINE  08/11/2016  . DEXA SCAN  01/11/2017 (Originally 01/31/1987)  . PNA vac Low Risk Adult (2 of 2 - PPSV23) 01/11/2017 (Originally 08/22/2014)  . TETANUS/TDAP  05/21/2018      Plan:    I have personally reviewed and addressed the Medicare Annual Wellness questionnaire and have noted the following in the patient's chart:  A. Medical and social history B. Use of alcohol, tobacco or illicit drugs  C. Current medications and supplements D. Functional ability and status E.  Nutritional status F.  Physical activity G. Advance directives H. List of other physicians I.  Hospitalizations, surgeries, and ER visits in previous 12 months J.  [place] to include hearing, vision, cognitive, depression L. Referrals and appointments - none  In addition, I have reviewed and discussed with patient certain preventive protocols, quality metrics, and best practice recommendations. A written personalized care plan for preventive services as well as general preventive health recommendations were provided to patient.  See attached scanned questionnaire for additional information.   Signed,   Rich Reining, RN Nurse Health Advisor   Quick Notes   Health Maintenance: PNA 23 and dexa due     Abnormal Screen: MMSE 29/30, passed clock  drawing     Patient Concerns: None     Nurse Concerns: None

## 2016-08-25 NOTE — Patient Instructions (Signed)
Vanessa [name] , Thank you for taking time to come for your Medicare Wellness Visit. I appreciate your ongoing commitment to your health goals. Please review the following plan we discussed and let me know if I can assist you in the future.   Screening recommendations/referrals: Colonoscopy excluded, pt over age 81 Mammogram excluded, pt over age 58 Bone Density due Recommended yearly ophthalmology/optometry visit for glaucoma screening and checkup Recommended yearly dental visit for hygiene and checkup  Vaccinations: Influenza vaccine due 2018 fall season Pneumococcal vaccine 23 due Tdap vaccine up to date. Due 05/21/18 Shingles vaccine not in records  Advanced directives: Healthcare power of attorney in chart, need living will  Conditions/risks identified: None  Next appointment: Dr. Bubba Camp makes rounds   Preventive Care 65 Years and Older, Female Preventive care refers to lifestyle choices and visits with your health care provider that can promote health and wellness. What does preventive care include?  A yearly physical exam. This is also called an annual well check.  Dental exams once or twice a year.  Routine eye exams. Ask your health care provider how often you should have your eyes checked.  Personal lifestyle choices, including:  Daily care of your teeth and gums.  Regular physical activity.  Eating a healthy diet.  Avoiding tobacco and drug use.  Limiting alcohol use.  Practicing safe sex.  Taking low-dose aspirin every day.  Taking vitamin and mineral supplements as recommended by your health care provider. What happens during an annual well check? The services and screenings done by your health care provider during your annual well check will depend on your age, overall health, lifestyle risk factors, and family history of disease. Counseling  Your health care provider may ask you questions about your:  Alcohol use.  Tobacco use.  Drug  use.  Emotional well-being.  Home and relationship well-being.  Sexual activity.  Eating habits.  History of falls.  Memory and ability to understand (cognition).  Work and work Statistician.  Reproductive health. Screening  You may have the following tests or measurements:  Height, weight, and BMI.  Blood pressure.  Lipid and cholesterol levels. These may be checked every 5 years, or more frequently if you are over 47 years old.  Skin check.  Lung cancer screening. You may have this screening every year starting at age 65 if you have a 30-pack-year history of smoking and currently smoke or have quit within the past 15 years.  Fecal occult blood test (FOBT) of the stool. You may have this test every year starting at age 52.  Flexible sigmoidoscopy or colonoscopy. You may have a sigmoidoscopy every 5 years or a colonoscopy every 10 years starting at age 20.  Hepatitis C blood test.  Hepatitis B blood test.  Sexually transmitted disease (STD) testing.  Diabetes screening. This is done by checking your blood sugar (glucose) after you have not eaten for a while (fasting). You may have this done every 1-3 years.  Bone density scan. This is done to screen for osteoporosis. You may have this done starting at age 39.  Mammogram. This may be done every 1-2 years. Talk to your health care provider about how often you should have regular mammograms. Talk with your health care provider about your test results, treatment options, and if necessary, the need for more tests. Vaccines  Your health care provider may recommend certain vaccines, such as:  Influenza vaccine. This is recommended every year.  Tetanus, diphtheria, and acellular pertussis (Tdap, Td)  vaccine. You may need a Td booster every 10 years.  Zoster vaccine. You may need this after age 35.  Pneumococcal 13-valent conjugate (PCV13) vaccine. One dose is recommended after age 32.  Pneumococcal polysaccharide  (PPSV23) vaccine. One dose is recommended after age 60. Talk to your health care provider about which screenings and vaccines you need and how often you need them. This information is not intended to replace advice given to you by your health care provider. Make sure you discuss any questions you have with your health care provider. Document Released: 01/24/2015 Document Revised: 09/17/2015 Document Reviewed: 10/29/2014 Elsevier Interactive Patient Education  2017 [employer] Prevention in the Home Falls can cause injuries. They can happen to people of all ages. There are many things you can do to make your home safe and to help prevent falls. What can I do on the outside of my home?  Regularly fix the edges of walkways and driveways and fix any cracks.  Remove anything that might make you trip as you walk through a door, such as a raised step or threshold.  Trim any bushes or trees on the path to your home.  Use bright outdoor lighting.  Clear any walking paths of anything that might make someone trip, such as rocks or tools.  Regularly check to see if handrails are loose or broken. Make sure that both sides of any steps have handrails.  Any raised decks and porches should have guardrails on the edges.  Have any leaves, snow, or ice cleared regularly.  Use sand or salt on walking paths during winter.  Clean up any spills in your garage right away. This includes oil or grease spills. What can I do in the bathroom?  Use night lights.  Install grab bars by the toilet and in the tub and shower. Do not use towel bars as grab bars.  Use non-skid mats or decals in the tub or shower.  If you need to sit down in the shower, use a plastic, non-slip stool.  Keep the floor dry. Clean up any water that spills on the floor as soon as it happens.  Remove soap buildup in the tub or shower regularly.  Attach bath mats securely with double-sided non-slip rug tape.  Do not have  throw rugs and other things on the floor that can make you trip. What can I do in the bedroom?  Use night lights.  Make sure that you have a light by your bed that is easy to reach.  Do not use any sheets or blankets that are too big for your bed. They should not hang down onto the floor.  Have a firm chair that has side arms. You can use this for support while you get dressed.  Do not have throw rugs and other things on the floor that can make you trip. What can I do in the kitchen?  Clean up any spills right away.  Avoid walking on wet floors.  Keep items that you use a lot in easy-to-reach places.  If you need to reach something above you, use a strong step stool that has a grab bar.  Keep electrical cords out of the way.  Do not use floor polish or wax that makes floors slippery. If you must use wax, use non-skid floor wax.  Do not have throw rugs and other things on the floor that can make you trip. What can I do with my stairs?  Do not leave  any items on the stairs.  Make sure that there are handrails on both sides of the stairs and use them. Fix handrails that are broken or loose. Make sure that handrails are as long as the stairways.  Check any carpeting to make sure that it is firmly attached to the stairs. Fix any carpet that is loose or worn.  Avoid having throw rugs at the top or bottom of the stairs. If you do have throw rugs, attach them to the floor with carpet tape.  Make sure that you have a light switch at the top of the stairs and the bottom of the stairs. If you do not have them, ask someone to add them for you. What else can I do to help prevent falls?  Wear shoes that:  Do not have high heels.  Have rubber bottoms.  Are comfortable and fit you well.  Are closed at the toe. Do not wear sandals.  If you use a stepladder:  Make sure that it is fully opened. Do not climb a closed stepladder.  Make sure that both sides of the stepladder are  locked into place.  Ask someone to hold it for you, if possible.  Clearly mark and make sure that you can see:  Any grab bars or handrails.  First and last steps.  Where the edge of each step is.  Use tools that help you move around (mobility aids) if they are needed. These include:  Canes.  Walkers.  Scooters.  Crutches.  Turn on the lights when you go into a dark area. Replace any light bulbs as soon as they burn out.  Set up your furniture so you have a clear path. Avoid moving your furniture around.  If any of your floors are uneven, fix them.  If there are any pets around you, be aware of where they are.  Review your medicines with your doctor. Some medicines can make you feel dizzy. This can increase your chance of falling. Ask your doctor what other things that you can do to help prevent falls. This information is not intended to replace advice given to you by your health care provider. Make sure you discuss any questions you have with your health care provider. Document Released: 10/24/2008 Document Revised: 06/05/2015 Document Reviewed: 02/01/2014 Elsevier Interactive Patient Education  2017 [employer].

## 2016-08-26 DIAGNOSIS — M6281 Muscle weakness (generalized): Secondary | ICD-10-CM | POA: Diagnosis not present

## 2016-08-26 DIAGNOSIS — R296 Repeated falls: Secondary | ICD-10-CM | POA: Diagnosis not present

## 2016-08-26 DIAGNOSIS — R2681 Unsteadiness on feet: Secondary | ICD-10-CM | POA: Diagnosis not present

## 2016-08-27 DIAGNOSIS — R2681 Unsteadiness on feet: Secondary | ICD-10-CM | POA: Diagnosis not present

## 2016-08-27 DIAGNOSIS — R296 Repeated falls: Secondary | ICD-10-CM | POA: Diagnosis not present

## 2016-08-27 DIAGNOSIS — M6281 Muscle weakness (generalized): Secondary | ICD-10-CM | POA: Diagnosis not present

## 2016-08-31 DIAGNOSIS — R2681 Unsteadiness on feet: Secondary | ICD-10-CM | POA: Diagnosis not present

## 2016-08-31 DIAGNOSIS — R296 Repeated falls: Secondary | ICD-10-CM | POA: Diagnosis not present

## 2016-08-31 DIAGNOSIS — M6281 Muscle weakness (generalized): Secondary | ICD-10-CM | POA: Diagnosis not present

## 2016-09-02 DIAGNOSIS — R296 Repeated falls: Secondary | ICD-10-CM | POA: Diagnosis not present

## 2016-09-02 DIAGNOSIS — R2681 Unsteadiness on feet: Secondary | ICD-10-CM | POA: Diagnosis not present

## 2016-09-02 DIAGNOSIS — M6281 Muscle weakness (generalized): Secondary | ICD-10-CM | POA: Diagnosis not present

## 2016-09-07 DIAGNOSIS — R296 Repeated falls: Secondary | ICD-10-CM | POA: Diagnosis not present

## 2016-09-07 DIAGNOSIS — R2681 Unsteadiness on feet: Secondary | ICD-10-CM | POA: Diagnosis not present

## 2016-09-07 DIAGNOSIS — M6281 Muscle weakness (generalized): Secondary | ICD-10-CM | POA: Diagnosis not present

## 2016-09-08 ENCOUNTER — Encounter: Payer: Self-pay | Admitting: Vascular Surgery

## 2016-09-09 DIAGNOSIS — M5136 Other intervertebral disc degeneration, lumbar region: Secondary | ICD-10-CM | POA: Diagnosis not present

## 2016-09-09 DIAGNOSIS — M961 Postlaminectomy syndrome, not elsewhere classified: Secondary | ICD-10-CM | POA: Diagnosis not present

## 2016-09-09 DIAGNOSIS — M5030 Other cervical disc degeneration, unspecified cervical region: Secondary | ICD-10-CM | POA: Diagnosis not present

## 2016-09-09 DIAGNOSIS — R296 Repeated falls: Secondary | ICD-10-CM | POA: Diagnosis not present

## 2016-09-09 DIAGNOSIS — R2681 Unsteadiness on feet: Secondary | ICD-10-CM | POA: Diagnosis not present

## 2016-09-09 DIAGNOSIS — M6281 Muscle weakness (generalized): Secondary | ICD-10-CM | POA: Diagnosis not present

## 2016-09-09 DIAGNOSIS — M545 Low back pain: Secondary | ICD-10-CM | POA: Diagnosis not present

## 2016-09-10 DIAGNOSIS — L570 Actinic keratosis: Secondary | ICD-10-CM | POA: Diagnosis not present

## 2016-09-10 DIAGNOSIS — L309 Dermatitis, unspecified: Secondary | ICD-10-CM | POA: Diagnosis not present

## 2016-09-10 DIAGNOSIS — L821 Other seborrheic keratosis: Secondary | ICD-10-CM | POA: Diagnosis not present

## 2016-09-10 DIAGNOSIS — D225 Melanocytic nevi of trunk: Secondary | ICD-10-CM | POA: Diagnosis not present

## 2016-09-10 DIAGNOSIS — L814 Other melanin hyperpigmentation: Secondary | ICD-10-CM | POA: Diagnosis not present

## 2016-09-10 DIAGNOSIS — L820 Inflamed seborrheic keratosis: Secondary | ICD-10-CM | POA: Diagnosis not present

## 2016-09-14 ENCOUNTER — Other Ambulatory Visit: Payer: Self-pay | Admitting: *Deleted

## 2016-09-14 DIAGNOSIS — R296 Repeated falls: Secondary | ICD-10-CM | POA: Diagnosis not present

## 2016-09-14 DIAGNOSIS — M6281 Muscle weakness (generalized): Secondary | ICD-10-CM | POA: Diagnosis not present

## 2016-09-15 ENCOUNTER — Ambulatory Visit (INDEPENDENT_AMBULATORY_CARE_PROVIDER_SITE_OTHER): Payer: Medicare Other | Admitting: Physician Assistant

## 2016-09-15 ENCOUNTER — Ambulatory Visit (HOSPITAL_COMMUNITY)
Admission: RE | Admit: 2016-09-15 | Discharge: 2016-09-15 | Disposition: A | Discharge status: Home / Self Care | Payer: Medicare Other | Source: Ambulatory Visit | Attending: Family | Admitting: Family

## 2016-09-15 ENCOUNTER — Encounter: Payer: Self-pay | Admitting: Physician Assistant

## 2016-09-15 VITALS — BP 127/63 | HR 56 | Temp 97.7°F | Resp 14 | Ht 61.0 in | Wt 95.0 lb

## 2016-09-15 DIAGNOSIS — I6521 Occlusion and stenosis of right carotid artery: Secondary | ICD-10-CM | POA: Diagnosis not present

## 2016-09-15 DIAGNOSIS — I6523 Occlusion and stenosis of bilateral carotid arteries: Secondary | ICD-10-CM

## 2016-09-15 DIAGNOSIS — I6522 Occlusion and stenosis of left carotid artery: Secondary | ICD-10-CM

## 2016-09-15 DIAGNOSIS — I6529 Occlusion and stenosis of unspecified carotid artery: Secondary | ICD-10-CM | POA: Diagnosis not present

## 2016-09-15 LAB — VAS US CAROTID
LEFT ECA DIAS: -2 cm/s
LEFT VERTEBRAL DIAS: 16 cm/s
Left CCA dist dias: -1 cm/s
Left CCA dist sys: -30 cm/s
Left CCA prox dias: 0 cm/s
Left CCA prox sys: 83 cm/s
RIGHT CCA MID DIAS: -15 cm/s
RIGHT ECA DIAS: -1 cm/s
RIGHT VERTEBRAL DIAS: -7 cm/s
Right CCA prox dias: 17 cm/s
Right CCA prox sys: 124 cm/s
Right cca dist sys: -81 cm/s

## 2016-09-15 NOTE — Progress Notes (Signed)
History of Present Illness:  Patient is a 83 y.o. year old female who presents for evaluation of carotid stenosis surveillance with known left ICA occlusion .  The patient denies symptoms of TIA, amaurosis, or stroke.  The patient is currently on Aspirin antiplatelet therapy, Zocor for hyperlipidemia and Atenolol for HTN.    She reports no health changes or medication changes in the past year.      Past Medical History:  Diagnosis Date  . Abdominal bloating   . Atrial fibrillation ([place])   . Bruises easily   . Carotid artery occlusion    LEFT  . Dizziness   . DOE (dyspnea on exertion) 04/02/2014  . Fall at home Sept 2013, Dec. 2013  Jun 08, 2012  . GERD (gastroesophageal reflux disease) 02/11/2015  . Headache(784.0)   . Hoarseness   . Hypercholesterolemia   . Hyperglycemia 04/02/2014   Glucose 178 mg percent on 01/22/2014 04/05/14 Hgb A1c 6.6 Diet controlled.    . Hypothyroidism 04/15/2015  . Neuropathy    PERIPHERAL  . Pruritus   . Scoliosis   . Varicose veins     Past Surgical History:  Procedure Laterality Date  . ABDOMINAL HYSTERECTOMY  1954  . CHOLECYSTECTOMY  1997  . SPINE SURGERY  1997   correct scoliosis     Social History Social History  Substance Use Topics  . Smoking status: Never Smoker  . Smokeless tobacco: Never Used  . Alcohol use No    Family History Family History  Problem Relation Age of Onset  . Adopted: Yes  . Diabetes Mother   . Heart attack Mother   . Heart disease Mother        After age 97  . Heart attack Father   . Stroke Father   . Breast cancer Sister   . Heart disease Maternal Grandmother   . Cancer Son        adopted son. esophagus, stomach, liver  . Stomach cancer Neg Hx   . Colon cancer Neg Hx     Allergies  No Known Allergies   Current Outpatient Prescriptions  Medication Sig Dispense Refill  . acetaminophen (TYLENOL) 500 MG tablet Take 500 mg by mouth. Take one tablet every 6 hours as needed    . aspirin 81 MG  tablet Take 81 mg by mouth daily.    Marland Kitchen atenolol (TENORMIN) 25 MG tablet TAKE 1 TABLET TWICE DAILY. 180 tablet 2  . Biotin 5000 MCG CAPS Take 1 capsule by mouth daily.    . Calcium Carb-Cholecalciferol (CALCIUM-VITAMIN D) 600-400 MG-UNIT TABS Take 1 tablet by mouth 2 (two) times daily. 60 tablet 3  . docusate (COLACE) 50 MG/5ML liquid Take 100 mg by mouth at bedtime.    . DULoxetine (CYMBALTA) 20 MG capsule Take 20 mg by mouth at bedtime.    . Multiple Vitamins-Calcium (VIACTIV MULTI-VITAMIN PO) Take 1 tablet by mouth daily.    . Multiple Vitamins-Minerals (CENTRUM ADULTS PO) Take 1 tablet by mouth daily.    Marland Kitchen OVER THE COUNTER MEDICATION Take 1 capsule by mouth daily. FD Donald Prose    . polyethylene glycol (MIRALAX / GLYCOLAX) packet Take 17 g by mouth daily as needed.     . ranitidine (ZANTAC) 75 MG/5ML syrup Take 10 mLs by mouth 2 (two) times daily.    . simethicone (MYLICON) 80 MG chewable tablet Chew 80 mg by mouth 4 (four) times daily as needed for flatulence.    . simvastatin (ZOCOR) 20  MG tablet Take 20 mg by mouth daily.    . simvastatin (ZOCOR) 40 MG tablet     . traMADol (ULTRAM) 50 MG tablet Take 50 mg by mouth every 6 (six) hours as needed for moderate pain.    Marland Kitchen triamcinolone cream (KENALOG) 0.1 %     . alendronate (FOSAMAX) 70 MG tablet Take 1 tablet (70 mg total) by mouth every 7 (seven) days. Take with a full glass of water on an empty stomach. (Patient not taking: Reported on 09/15/2016) 4 tablet 11  . BISACODYL PO Take 5 mg by mouth as directed. Take once as needed then repeat if no BM.     No current facility-administered medications for this visit.     ROS:   General:  No weight loss, Fever, chills  HEENT: No recent headaches, no nasal bleeding, no visual changes, no sore throat  Neurologic: No dizziness, blackouts, seizures. No recent symptoms of stroke or mini- stroke. No recent episodes of slurred speech, or temporary blindness.  Cardiac: No recent episodes of chest  pain/pressure, no shortness of breath at rest.  No shortness of breath with exertion.  Denies history of atrial fibrillation or irregular heartbeat  Vascular: No history of rest pain in feet.  No history of claudication.  No history of non-healing ulcer, No history of DVT   Pulmonary: No home oxygen, no productive cough, no hemoptysis,  No asthma or wheezing  Musculoskeletal:  [x ] Arthritis, [ ]  Low back pain,  [x Joint pain  Hematologic:No history of hypercoagulable state.  No history of easy bleeding.  No history of anemia  Gastrointestinal: No hematochezia or melena,  No gastroesophageal reflux, no trouble swallowing  Urinary: [ ]  chronic Kidney disease, [ ]  on HD - [ ]  MWF or [ ]  TTHS, [ ]  Burning with urination, [ ]  Frequent urination, [ ]  Difficulty urinating;   Skin: No rashes  Psychological: No history of anxiety,  No history of depression   Physical Examination  Vitals:   09/15/16 1342 09/15/16 1348  BP: (!) 118/52 127/63  Pulse: (!) 56 (!) 56  Resp: 14   Temp: 97.7 F (36.5 C)   SpO2: 97%   Weight: 95 lb (43.1 kg)   Height: 5\' 1"  (1.549 m)     Body mass index is 17.95 kg/m.  General:  Alert and oriented, no acute distress HEENT: Normal Neck: No bruit or JVD Pulmonary: Clear to auscultation bilaterally Cardiac: Regular Rate and Rhythm without murmur Gastrointestinal: Soft, non-tender, non-distended, no mass, no scars Skin: No rash Extremity Pulses:  2+ radial, brachial, femoral, dorsalis pedis pulses bilaterally Musculoskeletal: No deformity or edema  Neurologic: Upper and lower extremity motor 5/5 and symmetric  DATA:  Carotid duple right ICA < 40 % no change from study on 09/03/2016   ASSESSMENT:  Carotid stenosis stable right ICA and known occlusion    PLAN: She is doing well and has no symptoms of stroke.  Her carotid duplex is unchanged from 1 year ago.  She will return in 1 year for repeat right carotid duplex.  She is on maximum medical  management with daily statin, beta blocker and asa.     Theda Sers, Tinsleigh Slovacek MAUREEN PA-C [facility]  Doctor in clinic Dr. Trula Slade

## 2016-09-16 ENCOUNTER — Encounter: Payer: Self-pay | Admitting: Internal Medicine

## 2016-09-16 ENCOUNTER — Ambulatory Visit (INDEPENDENT_AMBULATORY_CARE_PROVIDER_SITE_OTHER): Payer: Medicare Other | Admitting: Internal Medicine

## 2016-09-16 VITALS — BP 115/62 | HR 73 | Ht 61.0 in | Wt 93.6 lb

## 2016-09-16 DIAGNOSIS — I6523 Occlusion and stenosis of bilateral carotid arteries: Secondary | ICD-10-CM

## 2016-09-16 DIAGNOSIS — M6281 Muscle weakness (generalized): Secondary | ICD-10-CM | POA: Diagnosis not present

## 2016-09-16 DIAGNOSIS — I482 Chronic atrial fibrillation, unspecified: Secondary | ICD-10-CM

## 2016-09-16 DIAGNOSIS — I6522 Occlusion and stenosis of left carotid artery: Secondary | ICD-10-CM | POA: Diagnosis not present

## 2016-09-16 DIAGNOSIS — R296 Repeated falls: Secondary | ICD-10-CM | POA: Diagnosis not present

## 2016-09-16 DIAGNOSIS — E7800 Pure hypercholesterolemia, unspecified: Secondary | ICD-10-CM | POA: Diagnosis not present

## 2016-09-16 NOTE — Patient Instructions (Signed)
Dr Hilty recommends that you schedule a follow-up appointment in 12 months. You will receive a reminder letter in the mail two months in advance. If you don't receive a letter, please call our office to schedule the follow-up appointment.  If you need a refill on your cardiac medications before your next appointment, please call your pharmacy. 

## 2016-09-16 NOTE — Progress Notes (Signed)
OFFICE NOTE  Chief Complaint:  No complaints  Primary Care Physician: Blanchie Serve, MD  HPI:  Vanessa [name] is a 83 y.o. female who is a new patient of mine and was previously followed by Dr. [name]. She had a long-standing relationship with Dr. Mare Ferrari and since he recently retired has establish care with me. I have reviewed his extensive notes which indicate Vanessa [name] has a history of atrial fibrillation, GI bleeding in the past, 100% occlusion of the left internal carotid artery (followed by vascular surgery, and varicose veins. She also has dyslipidemia and a number of other noncardiac problems. In general, however she seems to do quite well. She is in assisted living at friend's home but is quite independent. Unfortunately, one of her sons currently has a cancer and does not seem to have a good prognosis. She does have another son who lives in [place] who she visited with over the Mother's Day weekend. She denies any chest pain or worsening shortness of breath recently. Her A. fib is chronic and she is rate controlled. She is not on anticoagulation again due to GI bleeding in the past and high risk for future bleeding and therefore only takes aspirin. Given her age, she is not a candidate for the Watchman left atrial appendage occluder device.  06/12/2015  Vanessa [name] was seen today for an urgent visit for chest pain. This morning she called into the office and she was noted to be having some chest discomfort. She is been notably anxious recently due to her son who is in [place] and has been having some health issues. Based on the stress and anxiety she was advised to try to take some Xanax to see if it improved her symptoms. She reports some mild improvement but still has some uneasiness in her chest. I asked her to come in for evaluation as she does have a strong family history of coronary disease without any coronary testing in the recent past. EKG today was  reassuring demonstrated no ischemic changes, atrial fibrillation with rate control at 60. She currently reports her pain as a 2-3 out of 10, nonradiating, present at rest and unchanged.  09/30/2015  Vanessa [name] was seen today for follow-up. Unfortunately the interim her son died this summer after a short illness. There is a significant amount of stress related to this which I think was causing her chest pain. That seems to resolve somewhat. Blood pressure appears normal today 120/56. EKG is unchanged and shows persistent A. fib. Again she is not anticoagulated due to history of falls and GI bleeding in the past.  04/02/2016  Vanessa [name] seen today in follow-up. She continues to have some joint pain and lower extremity pain which is likely neuropathy. She is pre-C on gabapentin only 100 mg daily at bedtime and this was discontinued. It may be that it was not effective because she was on such a low dose. She also wondered if it was okay for her to stop aspirin for 7 days prior to injections. Apparently Dr. Dossie Der with [facility] is interested in doing some injections. I don't see any problems with her doing that. She does have atrial fibrillation which is persistence however it would only be for short period of time patches not been a candidate for anticoagulation due to falls and bleeding risk.  09/16/2016  Vanessa [name] returns today for follow-up. Unfortunately she lost her son this past year. He was wanted to 20 boys she had. She  otherwise denies any chest pain or worsening shortness of breath. She's had some back injections. It's okay for her to come off of aspirin for that. She remains in A. fib but rate controlled. She's not been a candidate for anticoagulation as previously mentioned.  PMHx:  Past Medical History:  Diagnosis Date  . Abdominal bloating   . Atrial fibrillation ([place])   . Bruises easily   . Carotid artery occlusion    LEFT  . Dizziness   . DOE (dyspnea on  exertion) 04/02/2014  . Fall at home Sept 2013, Dec. 2013  Jun 08, 2012  . GERD (gastroesophageal reflux disease) 02/11/2015  . Headache(784.0)   . Hoarseness   . Hypercholesterolemia   . Hyperglycemia 04/02/2014   Glucose 178 mg percent on 01/22/2014 04/05/14 Hgb A1c 6.6 Diet controlled.    . Hypothyroidism 04/15/2015  . Neuropathy    PERIPHERAL  . Pruritus   . Scoliosis   . Varicose veins     Past Surgical History:  Procedure Laterality Date  . ABDOMINAL HYSTERECTOMY  1954  . CHOLECYSTECTOMY  1997  . SPINE SURGERY  1997   correct scoliosis    FAMHx:  Family History  Problem Relation Age of Onset  . Adopted: Yes  . Diabetes Mother   . Heart attack Mother   . Heart disease Mother        After age 75  . Heart attack Father   . Stroke Father   . Breast cancer Sister   . Heart disease Maternal Grandmother   . Cancer Son        adopted son. esophagus, stomach, liver  . Stomach cancer Neg Hx   . Colon cancer Neg Hx     SOCHx:   reports that she has never smoked. She has never used smokeless tobacco. She reports that she does not drink alcohol or use drugs.  ALLERGIES:  No Known Allergies  ROS: Pertinent items noted in HPI and remainder of comprehensive ROS otherwise negative.  HOME MEDS: Current Outpatient Prescriptions  Medication Sig Dispense Refill  . acetaminophen (TYLENOL) 500 MG tablet Take 500 mg by mouth. Take one tablet every 6 hours as needed    . aspirin 81 MG tablet Take 81 mg by mouth daily.    Marland Kitchen atenolol (TENORMIN) 25 MG tablet TAKE 1 TABLET TWICE DAILY. 180 tablet 2  . Biotin 5000 MCG CAPS Take 1 capsule by mouth daily.    Marland Kitchen BISACODYL PO Take 5 mg by mouth as directed. Take once as needed then repeat if no BM.    Marland Kitchen Calcium Carb-Cholecalciferol (CALCIUM-VITAMIN D) 600-400 MG-UNIT TABS Take 1 tablet by mouth 2 (two) times daily. 60 tablet 3  . docusate (COLACE) 50 MG/5ML liquid Take 100 mg by mouth at bedtime.    . DULoxetine (CYMBALTA) 20 MG capsule  Take 20 mg by mouth at bedtime.    . Multiple Vitamins-Calcium (VIACTIV MULTI-VITAMIN PO) Take 1 tablet by mouth daily.    . Multiple Vitamins-Minerals (CENTRUM ADULTS PO) Take 1 tablet by mouth daily.    Marland Kitchen OVER THE COUNTER MEDICATION Take 1 capsule by mouth daily. FD Donald Prose    . polyethylene glycol (MIRALAX / GLYCOLAX) packet Take 17 g by mouth daily as needed.     . ranitidine (ZANTAC) 75 MG/5ML syrup Take 10 mLs by mouth 2 (two) times daily.    . simethicone (MYLICON) 80 MG chewable tablet Chew 80 mg by mouth 4 (four) times daily as needed for flatulence.    Marland Kitchen  simvastatin (ZOCOR) 40 MG tablet Take 20 mg by mouth daily.    . traMADol (ULTRAM) 50 MG tablet Take 50 mg by mouth every 6 (six) hours as needed for moderate pain.    Marland Kitchen triamcinolone cream (KENALOG) 0.1 %      No current facility-administered medications for this visit.     LABS/IMAGING: No results found for this or any previous visit (from the past 48 hour(s)). No results found.  WEIGHTS: Wt Readings from Last 3 Encounters:  09/16/16 93 lb 9.6 oz (42.5 kg)  09/15/16 95 lb (43.1 kg)  08/25/16 92 lb (41.7 kg)    VITALS: BP 115/62 (BP Location: Left Arm, Cuff Size: Normal)   Pulse 73   Ht 5\' 1"  (1.549 m)   Wt 93 lb 9.6 oz (42.5 kg)   BMI 17.69 kg/m   EXAM: General appearance: alert, appears stated age and no distress Neck: no carotid bruit and no JVD Lungs: clear to auscultation bilaterally Heart: irregularly irregular rhythm Abdomen: soft, non-tender; bowel sounds normal; no masses,  no organomegaly Extremities: extremities normal, atraumatic, no cyanosis or edema and varicose veins noted Pulses: 2+ and symmetric Skin: Skin color, texture, turgor normal. No rashes or lesions Neurologic: Grossly normal Psych: Pleasant  EKG: Atrial fibrillation at 73-personally reviewed  ASSESSMENT: 1. Anxiety 2. Chronic atrial fibrillation 3. CHADSVASC score of 6-not anticoagulated due to GI bleeding in the past and  falls 4. Dyslipidemia 5. Occluded left carotid artery  PLAN: 1.   Vanessa [name] is doing well and remains in atrial fibrillation which is chronic. She's not anticoagulated due to GI bleeding and falls in the past. She can come off aspirin as needed for at least 7 days prior to injections. Otherwise no changes in her current medicines.  Follow-up annually.  Pixie Casino, MD, [facility] Attending Cardiologist [place] 09/16/2016, 4:52 PM

## 2016-09-22 ENCOUNTER — Encounter: Payer: Self-pay | Admitting: Adult Health

## 2016-09-22 ENCOUNTER — Ambulatory Visit (INDEPENDENT_AMBULATORY_CARE_PROVIDER_SITE_OTHER): Payer: Medicare Other | Admitting: Adult Health

## 2016-09-22 VITALS — BP 120/52 | HR 76 | Wt 95.0 lb

## 2016-09-22 DIAGNOSIS — G629 Polyneuropathy, unspecified: Secondary | ICD-10-CM

## 2016-09-22 DIAGNOSIS — R42 Dizziness and giddiness: Secondary | ICD-10-CM | POA: Diagnosis not present

## 2016-09-22 DIAGNOSIS — I6523 Occlusion and stenosis of bilateral carotid arteries: Secondary | ICD-10-CM | POA: Diagnosis not present

## 2016-09-22 NOTE — Progress Notes (Signed)
PATIENT: Vanessa [name] DOB: 1920-12-25  REASON FOR VISIT: follow up- vertigo, peripheral neuropathy HISTORY FROM: patient  HISTORY OF PRESENT ILLNESS: Today 09/22/16 Vanessa [name] is a 83 year old female with a history of positional vertigo and peripheral neuropathy. She returns today for follow-up. At the last visit she was started on Cymbalta. She states that she is taking 20 mg at bedtime. She has numbness beneficial for the burning tingling pain that she was experiencing in the feet and up the legs. She does not feel that her walking or balance has change significantly. She uses a cane when ambulating. She has been in [facility]. She denies any recent falls. She reports that she is no longer having the sharp lancing pains in the feet and legs. She returns today for an evaluation.  HISTORY 05/12/16: Vanessa [name] is a 83 year old right-handed white female with a history of positional vertigo, seen through this office previously. The patient comes in today with a new problem. Over the last 3 months or so she has developed some sensations of coldness of the hands and feet. The patient will occasionally have sharp lancinating pains in the feet and pins and needles paresthesias. The symptoms are worse in the evening hours when she is trying to get rest, she does not notice the symptoms as much during the day when she is active. The patient does have gait instability, she uses a cane for ambulation, she has not had any recent falls. She does have some chronic neck and shoulder discomfort and some occasional low back pain, she has had prior lumbosacral spine surgery. The patient does have some constipation issues, she denies any problems with bladder control. She does not note any true weakness of the extremities. She has been given a trial on gabapentin, the dose is unknown. She was taken off of the gabapentin because it did not help, not because of intolerance. She is sent to  this office for an evaluation.   REVIEW OF SYSTEMS: Out of a complete 14 system review of symptoms, the patient complains only of the following symptoms, and all other reviewed systems are negative.  Choking, trouble swallowing, joint pain, back pain, rash, moles, itching, bruise easily  ALLERGIES: No Known Allergies  HOME MEDICATIONS: Outpatient Medications Prior to Visit  Medication Sig Dispense Refill  . acetaminophen (TYLENOL) 500 MG tablet Take 500 mg by mouth. Take one tablet every 6 hours as needed    . aspirin 81 MG tablet Take 81 mg by mouth daily.    Marland Kitchen atenolol (TENORMIN) 25 MG tablet TAKE 1 TABLET TWICE DAILY. 180 tablet 2  . Biotin 5000 MCG CAPS Take 1 capsule by mouth daily.    . Calcium Carb-Cholecalciferol (CALCIUM-VITAMIN D) 600-400 MG-UNIT TABS Take 1 tablet by mouth 2 (two) times daily. 60 tablet 3  . docusate (COLACE) 50 MG/5ML liquid Take 100 mg by mouth at bedtime.    . DULoxetine (CYMBALTA) 20 MG capsule Take 20 mg by mouth at bedtime.    . Multiple Vitamins-Calcium (VIACTIV MULTI-VITAMIN PO) Take 1 tablet by mouth daily.    Marland Kitchen OVER THE COUNTER MEDICATION Take 1 capsule by mouth daily. FD Donald Prose    . polyethylene glycol (MIRALAX / GLYCOLAX) packet Take 17 g by mouth daily as needed.     . ranitidine (ZANTAC) 75 MG/5ML syrup Take 10 mLs by mouth 2 (two) times daily.    . simvastatin (ZOCOR) 40 MG tablet Take 20 mg by mouth daily.    Marland Kitchen  triamcinolone cream (KENALOG) 0.1 %     . BISACODYL PO Take 5 mg by mouth as directed. Take once as needed then repeat if no BM.    . simethicone (MYLICON) 80 MG chewable tablet Chew 80 mg by mouth 4 (four) times daily as needed for flatulence.    . traMADol (ULTRAM) 50 MG tablet Take 50 mg by mouth every 6 (six) hours as needed for moderate pain.    . Multiple Vitamins-Minerals (CENTRUM ADULTS PO) Take 1 tablet by mouth daily.     No facility-administered medications prior to visit.     PAST MEDICAL HISTORY: Past Medical History:    Diagnosis Date  . Abdominal bloating   . Atrial fibrillation ([place])   . Bruises easily   . Carotid artery occlusion    LEFT  . Dizziness   . DOE (dyspnea on exertion) 04/02/2014  . Fall at home Sept 2013, Dec. 2013  Jun 08, 2012  . GERD (gastroesophageal reflux disease) 02/11/2015  . Headache(784.0)   . Hoarseness   . Hypercholesterolemia   . Hyperglycemia 04/02/2014   Glucose 178 mg percent on 01/22/2014 04/05/14 Hgb A1c 6.6 Diet controlled.    . Hypothyroidism 04/15/2015  . Neuropathy    PERIPHERAL  . Pruritus   . Scoliosis   . Varicose veins     PAST SURGICAL HISTORY: Past Surgical History:  Procedure Laterality Date  . ABDOMINAL HYSTERECTOMY  1954  . CHOLECYSTECTOMY  1997  . SPINE SURGERY  1997   correct scoliosis    FAMILY HISTORY: Family History  Problem Relation Age of Onset  . Adopted: Yes  . Diabetes Mother   . Heart attack Mother   . Heart disease Mother        After age 7  . Heart attack Father   . Stroke Father   . Breast cancer Sister   . Heart disease Maternal Grandmother   . Cancer Son        adopted son. esophagus, stomach, liver  . Stomach cancer Neg Hx   . Colon cancer Neg Hx     SOCIAL HISTORY: Social History   Social History  . Marital status: Widowed    Spouse name: N/A  . Number of children: 2  . Years of education: 12+   Occupational History  . 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 Smoking status: Never Smoker  . Smokeless tobacco: Never Used  . Alcohol use No  . Drug use: No  . Sexual activity: No   Other Topics Concern  . Not on file   Social History Narrative   Patient lives at [facility] 03/05/2014   Patient is a widowed.    Patient is retired.    Patient has no children but adopted twin sons.    Patient has a college degree.    Never smoked   Alcohol none   Exercise with therapy   Walks with cane   POA       Patient only drinks de-caf  drinks.   Patient is right handed.      PHYSICAL EXAM  Vitals:   09/22/16 1440  BP: (!) 120/52  Pulse: 76  Weight: 95 lb (43.1 kg)   Body mass index is 17.95 kg/m.  Generalized: Well developed, in no acute distress   Neurological examination  Mentation: Alert oriented to time, place, history taking. Follows all commands speech and language fluent Cranial nerve II-XII: Pupils  were equal round reactive to light. Extraocular movements were full, visual field were full on confrontational test. Facial sensation and strength were normal. Uvula tongue midline. Head turning and shoulder shrug  were normal and symmetric. Motor: The motor testing reveals 5 over 5 strength of all 4 extremities. Good symmetric motor tone is noted throughout.  Sensory: Sensory testing is intact to soft touch on all 4 extremities. No evidence of extinction is noted.  Coordination: Cerebellar testing reveals good finger-nose-finger and heel-to-shin bilaterally.  Gait and station: Gait is normal. Tandem gait is normal. Romberg is negative. No drift is seen.  Reflexes: Deep tendon reflexes are symmetric and normal bilaterally.   DIAGNOSTIC DATA (LABS, IMAGING, TESTING) - I reviewed patient records, labs, notes, testing and imaging myself where available.  Lab Results  Component Value Date   WBC 9.6 07/01/2016   HGB 12.9 07/01/2016   HCT 39 07/01/2016   MCV 87.9 01/22/2014   PLT 296 07/01/2016      Component Value Date/Time   NA 139 07/29/2016 0740   NA 138 07/01/2016   K 4.2 07/29/2016 0740   CL 103 07/29/2016 0740   CO2 27 07/29/2016 0740   GLUCOSE 82 07/29/2016 0740   BUN 17 07/29/2016 0740   BUN 12 07/01/2016   CREATININE 0.85 07/29/2016 0740   CALCIUM 8.8 07/29/2016 0740   PROT 5.7 (L) 07/29/2016 0740   PROT 6.5 05/12/2016 1304   ALBUMIN 3.2 (L) 07/29/2016 0740   AST 19 07/29/2016 0740   ALT 9 07/29/2016 0740   ALKPHOS 80 07/29/2016 0740   BILITOT 0.6 07/29/2016 0740   GFRNONAA 72 (L)  01/22/2014 1632   GFRAA 83 (L) 01/22/2014 1632   Lab Results  Component Value Date   CHOL 152 07/29/2016   HDL 74 07/29/2016   LDLCALC 63 07/29/2016   TRIG 77 07/29/2016   CHOLHDL 2.1 07/29/2016   Lab Results  Component Value Date   HGBA1C 6.4 07/07/2015   Lab Results  Component Value Date   GYIRSWNI62 703 05/12/2016   Lab Results  Component Value Date   TSH 1.59 07/29/2016      ASSESSMENT AND PLAN 83 y.o. year old female  has a past medical history of Abdominal bloating; Atrial fibrillation ([place]); Bruises easily; Carotid artery occlusion; Dizziness; DOE (dyspnea on exertion) (04/02/2014); Fall at home (Sept 2013, Dec. 2013  Jun 08, 2012); GERD (gastroesophageal reflux disease) (02/11/2015); Headache(784.0); Hoarseness; Hypercholesterolemia; Hyperglycemia (04/02/2014); Hypothyroidism (04/15/2015); Neuropathy; Pruritus; Scoliosis; and Varicose veins. here with:  1. Peripheral neuropathy  2. Vertigo  The patient reports that her discomfort related to neuropathy has improved with Cymbalta. I've encouraged the patient to continue Cymbalta 20 mg at bedtime. Advised that if her symptoms worsen we could potentially increase Cymbalta to 30 mg. She has not had any issues with vertigo. She is advised that if her symptoms worsen or she develops new symptoms she should let us know. She'll follow-up in 6 months or sooner if needed.    Ward Givens, MSN, NP-C 09/22/2016, 2:54 PM [facility] [address] [place], [place] [address] [phone]

## 2016-09-22 NOTE — Patient Instructions (Addendum)
Your Plan:  Continue cymbalta 20 mg daily  If your symptoms worsen or you develop new symptoms please let us know.    Thank you for coming to see [place] at [facility]. I hope we have been able to provide you high quality care today.  You may receive a patient satisfaction survey over the next few weeks. We would appreciate your feedback and comments so that we may continue to improve ourselves and the health of our patients.

## 2016-09-22 NOTE — Progress Notes (Signed)
I have read the note, and I agree with the clinical assessment and plan.  WILLIS,CHARLES KEITH   

## 2016-09-24 ENCOUNTER — Encounter: Payer: Self-pay | Admitting: Gastroenterology

## 2016-09-24 ENCOUNTER — Ambulatory Visit (INDEPENDENT_AMBULATORY_CARE_PROVIDER_SITE_OTHER): Payer: Medicare Other | Admitting: Gastroenterology

## 2016-09-24 VITALS — BP 90/60 | HR 64 | Ht 60.0 in | Wt 95.0 lb

## 2016-09-24 DIAGNOSIS — R140 Abdominal distension (gaseous): Secondary | ICD-10-CM

## 2016-09-24 DIAGNOSIS — R1013 Epigastric pain: Secondary | ICD-10-CM | POA: Diagnosis not present

## 2016-09-24 DIAGNOSIS — R198 Other specified symptoms and signs involving the digestive system and abdomen: Secondary | ICD-10-CM | POA: Diagnosis not present

## 2016-09-24 DIAGNOSIS — I6523 Occlusion and stenosis of bilateral carotid arteries: Secondary | ICD-10-CM

## 2016-09-24 NOTE — Progress Notes (Signed)
Review of pertinent gastrointestinal problems: 1. Dyspepsia, bloating, chronic:  Many years patient of Dr. Cristina Gong established care [facility] 2017; Symptoms possibly functional. I offered several times but she declined, higher risk given advanced age, frailty. Conservative measures: Gas ex. PPI. FD Guard samples helped but she has trouble affording them.  H. Pylori stool Ag 01/2016 negative; CBC, CMET normal 2017.   HPI: This is a  pleasant 83 year old woman whom I last saw for 5 months ago  Chief complaint is bloating, gurgling noises in her bowels  She has overall improved from GI perspective.  She has been slowly weaning off of gas ex, probiotics.    Mainly bothered by severe back pains, awaiting spinal injection.  Spent a while talking about that today.  [facility].  Taking liquid colace, this is helpful.  Also once daily miralax which she cuts back a bit because she's had a bit looser stools.  Still bothered by gurling in her bowels.  ROS: complete GI ROS as described in HPI, all other review negative.    Constitutional:  No unintentional weight loss   Past Medical History:  Diagnosis Date  . Abdominal bloating   . Atrial fibrillation ([place])   . Bruises easily   . Carotid artery occlusion    LEFT  . Dizziness   . DOE (dyspnea on exertion) 04/02/2014  . Fall at home Sept 2013, Dec. 2013  Jun 08, 2012  . GERD (gastroesophageal reflux disease) 02/11/2015  . Headache(784.0)   . Hoarseness   . Hypercholesterolemia   . Hyperglycemia 04/02/2014   Glucose 178 mg percent on 01/22/2014 04/05/14 Hgb A1c 6.6 Diet controlled.    . Hypothyroidism 04/15/2015  . Neuropathy    PERIPHERAL  . Pruritus   . Scoliosis   . Varicose veins     Past Surgical History:  Procedure Laterality Date  . ABDOMINAL HYSTERECTOMY  1954  . CHOLECYSTECTOMY  1997  . SPINE SURGERY  1997   correct scoliosis    Current Outpatient Prescriptions  Medication Sig Dispense Refill  . acetaminophen  (TYLENOL) 500 MG tablet Take 500 mg by mouth. Take one tablet every 6 hours as needed    . aspirin 81 MG tablet Take 81 mg by mouth daily.    Marland Kitchen atenolol (TENORMIN) 25 MG tablet TAKE 1 TABLET TWICE DAILY. 180 tablet 2  . Biotin 5000 MCG CAPS Take 1 capsule by mouth daily.    Marland Kitchen BISACODYL PO Take 5 mg by mouth as directed. Take once as needed then repeat if no BM.    Marland Kitchen Calcium-Vitamin D-Vitamin K (VIACTIV PO) Take 1 tablet by mouth daily. Calcium chew    . docusate (COLACE) 50 MG/5ML liquid Take 100 mg by mouth at bedtime.    . DULoxetine (CYMBALTA) 20 MG capsule Take 20 mg by mouth at bedtime.    . Multiple Vitamin (MULTIVITAMIN) tablet Take 1 tablet by mouth daily.    Marland Kitchen OVER THE COUNTER MEDICATION Take 1 capsule by mouth daily. FD Donald Prose    . polyethylene glycol (MIRALAX / GLYCOLAX) packet Take 17 g by mouth daily as needed.     . ranitidine (ZANTAC) 75 MG/5ML syrup Take 10 mLs by mouth 2 (two) times daily.    . simvastatin (ZOCOR) 40 MG tablet Take 20 mg by mouth daily.    . traMADol (ULTRAM) 50 MG tablet Take 50 mg by mouth every 6 (six) hours as needed for moderate pain.    Marland Kitchen triamcinolone cream (KENALOG) 0.1 %  No current facility-administered medications for this visit.     Allergies as of 09/24/2016  . (No Known Allergies)    Family History  Problem Relation Age of Onset  . Adopted: Yes  . Diabetes Mother   . Heart attack Mother   . Heart disease Mother        After age 30  . Heart attack Father   . Stroke Father   . Breast cancer Sister   . Heart disease Maternal Grandmother   . Cancer Son        adopted son. esophagus, stomach, liver  . Stomach cancer Neg Hx   . Colon cancer Neg Hx     Social History   Social History  . Marital status: Widowed    Spouse name: N/A  . Number of children: 2  . Years of education: 12+   Occupational History  . 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 Smoking status:  Never Smoker  . Smokeless tobacco: Never Used  . Alcohol use No  . Drug use: No  . Sexual activity: No   Other Topics Concern  . Not on file   Social History Narrative   Patient lives at [facility] 03/05/2014   Patient is a widowed.    Patient is retired.    Patient has no children but adopted twin sons.    Patient has a college degree.    Never smoked   Alcohol none   Exercise with therapy   Walks with cane   POA       Patient only drinks de-caf drinks.   Patient is right handed.     Physical Exam: BP 90/60 (BP Location: Left Arm, Patient Position: Sitting, Cuff Size: Normal)   Pulse 64   Ht 5' (1.524 m)   Wt 95 lb (43.1 kg)   BMI 18.55 kg/m  Constitutional: generally well-appearing Psychiatric: alert and oriented x3 Abdomen: soft, nontender, nondistended, no obvious ascites, no peritoneal signs, normal bowel sounds No peripheral edema noted in lower extremities  Assessment and plan: 83 y.o. female with Frailty, chronic likely functional abdominal discomforts, gurgling noises in her bowels  I recommended cutting back on fruits and vegetables may help the gurgling sounds that seem to bother her. She will continue Colace and once in a while MiraLAX for her mild constipation. We discussed further testing to make sure missing anything serious such as a CAT scan she is not interested in that at this point. She will call if she has any further questions or concerns.  Please see the "Patient Instructions" section for addition details about the plan.  Owens Loffler, MD [facility] 09/24/2016, 10:42 AM

## 2016-09-24 NOTE — Patient Instructions (Addendum)
Continue colace daily and miralax as needed.  Avoiding too many fruits, vegetables may help the gurgling in your stomach.  Normal BMI (Body Mass Index- based on height and weight) is between 23 and 30. Your BMI today is Body mass index is 18.55 kg/m. Marland Kitchen Please consider follow up  regarding your BMI with your Primary Care Provider.

## 2016-09-29 DIAGNOSIS — L821 Other seborrheic keratosis: Secondary | ICD-10-CM | POA: Diagnosis not present

## 2016-10-04 ENCOUNTER — Other Ambulatory Visit: Payer: Self-pay | Admitting: Neurology

## 2016-10-07 DIAGNOSIS — M961 Postlaminectomy syndrome, not elsewhere classified: Secondary | ICD-10-CM | POA: Diagnosis not present

## 2016-10-07 DIAGNOSIS — M545 Low back pain: Secondary | ICD-10-CM | POA: Diagnosis not present

## 2016-10-26 DIAGNOSIS — R1312 Dysphagia, oropharyngeal phase: Secondary | ICD-10-CM | POA: Diagnosis not present

## 2016-11-01 ENCOUNTER — Telehealth: Payer: Self-pay | Admitting: Gastroenterology

## 2016-11-01 DIAGNOSIS — R1310 Dysphagia, unspecified: Secondary | ICD-10-CM

## 2016-11-01 NOTE — Telephone Encounter (Signed)
Dr. Ardis Hughs see message.  Do you want to work her in?

## 2016-11-01 NOTE — Telephone Encounter (Signed)
received referral to schedule OV for dysphagia. Dr. Ardis Hughs next available is late December. I offered patient an APP appointment but she refused stating that I should ask Dr. Ardis Hughs if he would work her in to see him. Please advise on scheduling.   Referral placed on Patty's desk.

## 2016-11-02 ENCOUNTER — Non-Acute Institutional Stay: Payer: Medicare Other | Admitting: Family

## 2016-11-02 ENCOUNTER — Encounter: Payer: Self-pay | Admitting: Family

## 2016-11-02 DIAGNOSIS — K5901 Slow transit constipation: Secondary | ICD-10-CM | POA: Diagnosis not present

## 2016-11-02 DIAGNOSIS — M545 Low back pain, unspecified: Secondary | ICD-10-CM

## 2016-11-02 DIAGNOSIS — G8929 Other chronic pain: Secondary | ICD-10-CM

## 2016-11-02 DIAGNOSIS — K219 Gastro-esophageal reflux disease without esophagitis: Secondary | ICD-10-CM

## 2016-11-02 DIAGNOSIS — G629 Polyneuropathy, unspecified: Secondary | ICD-10-CM | POA: Diagnosis not present

## 2016-11-02 DIAGNOSIS — R1314 Dysphagia, pharyngoesophageal phase: Secondary | ICD-10-CM | POA: Diagnosis not present

## 2016-11-02 DIAGNOSIS — I482 Chronic atrial fibrillation, unspecified: Secondary | ICD-10-CM

## 2016-11-02 NOTE — Telephone Encounter (Signed)
I prefer she have a barium esophagram first, after reviewing that I'll decide about any procedures.  She needs to chew her food well, eat slowly and take small bites.

## 2016-11-02 NOTE — H&P (View-Only) (Signed)
Location:  [place] Room Number: 1 Place of Service:  ALF [phone]) Provider: Dinah [name] FNP-C   Blanchie Serve, MD  Patient Care Team: Blanchie Serve, MD as PCP - General (Internal Medicine) Angelia Mould, MD as Attending Physician (Vascular Surgery) [name], MD as Attending Physician (Obstetrics and Gynecology) [name], MD (Orthopedic Surgery) [name], MD (Ophthalmology) [name], MD as Consulting Physician (Urology) [name], MD as Consulting Physician (Cardiology) [name], PA-C as Physician Assistant (Otolaryngology) [name], MD as Consulting Physician (Otolaryngology) [name], MD as Consulting Physician (Dermatology) [name], MD as Consulting Physician (Physical Medicine and Rehabilitation) [name], MD as Attending Physician (Gastroenterology) Mast, Man X, NP as Nurse Practitioner (Internal Medicine) Kathrynn Ducking, MD as Consulting Physician (Neurology) Debara Pickett Nadean Corwin, MD as Consulting Physician (Cardiology) Druscilla Brownie, MD as Consulting Physician (Dermatology)  Extended Emergency Contact Information Primary Emergency Contact: [place] Phone: [phone] Relation: Other Secondary Emergency Contact: Larina [place] Phone: [phone] Mobile Phone: [phone] Relation: Son  Code Status:  Full code  Goals of care: Advanced Directive information Advanced Directives 11/02/2016  Does Patient Have a Medical Advance Directive? Yes  Type of Advance Directive [place]  Does patient want to make changes to medical advance directive? -  Copy of Colon in Chart? Yes  Would patient like information on creating a medical advance directive? -     Chief Complaint  Patient presents with  . Medical Management of Chronic Issues    routine visit    HPI:  Pt is a  83 y.o. female seen today [facility] for medical management of chronic diseases.she has a medical history of AFib,GERD, dysphagia, Neuropathy, chronic back pain, Hypothyroidism, OA,OCD among other conditions.She is seen in her room today.She was seen 09/15/2016 by Laurence Slate PA-C at [facility] for carotid stenosis surveillance with known left ICA occlusion.Carotid duple right ICA < 40 % no change from study on 09/03/2016 was noted.she is currently on Aspirin antiplatelet therapy, Zocor for hyperlipidemia and Atenolol for HTN.  She was also seen 09/16/2016 by Cardiologist Dr. Lyman Bishop at [facility] for follow up on her chronic Afib.Dr. Debara Pickett noted that she is rate controlled and not on anticoagulation due to GI bleeding in the past and high risk for future bleeding and therefore recommend to continue on aspirin only.Also notes that due to her advance age, she is not a candidate for the Watchman left atrial appendage occluder device.  On 09/22/2016 she was seen at [facility] by  Ward Givens, NP for follow up vertigo and peripheral neuropathy currently on  Cymbalta 20 mg at bedtime with much improvement.NP notes patient was taken off of the gabapentin because it did not help with her neuropathy symptoms and not because of intolerance.Patient to continue on Cymbalta 20 mg Tablet NP plans to increase Cymbalta to 30 mg if symptoms worsen. No issues with vertigo.she will follow up with Neuro in 6 months or sooner if needed.   She was also seen 09/24/2016 by Dr. Owens Loffler at [facility] for follow bloating, gurgling noises worst on left quadrant.Currently on Gas-X and probiotics. She takes liquid colace and Miralax daily.Patient self administers own medication in the facility.She states cuts back on stool softeners whenever her stool is loose.she denies any issues with constipation today.Also sees Dr.Jacobs for dysphagia and  plans for  a Barium esophagram.   She complains of back pain on previous cortisone injection site given by Ortho 10/07/2016.Patient booked an appointment during this visit over the phone with Ortho office for 11/04/2016 at 2 Pm.I've recommended extra strength Tylenol for pain but she states does not like to take pain medication.She would like to explore another option with Ortho.  She denies any fever, chills, cough or urinary tract infections symptoms.She has had no recent fall episodes or weight changes.she does own ADL's.       Past Medical History:  Diagnosis Date  . Abdominal bloating   . Atrial fibrillation ([place])   . Bruises easily   . Carotid artery occlusion    LEFT  . Dizziness   . DOE (dyspnea on exertion) 04/02/2014  . Fall at home Sept 2013, Dec. 2013  Jun 08, 2012  . GERD (gastroesophageal reflux disease) 02/11/2015  . Headache(784.0)   . Hoarseness   . Hypercholesterolemia   . Hyperglycemia 04/02/2014   Glucose 178 mg percent on 01/22/2014 04/05/14 Hgb A1c 6.6 Diet controlled.    . Hypothyroidism 04/15/2015  . Neuropathy    PERIPHERAL  . Pruritus   . Scoliosis   . Varicose veins    Past Surgical History:  Procedure Laterality Date  . ABDOMINAL HYSTERECTOMY  1954  . CHOLECYSTECTOMY  1997  . SPINE SURGERY  1997   correct scoliosis    No Known Allergies  Allergies as of 11/02/2016   No Known Allergies     Medication List       Accurate as of 11/02/16  4:16 PM. Always use your most recent med list.          acetaminophen 500 MG tablet Commonly known as:  TYLENOL Take 500 mg by mouth. Take one tablet every 6 hours as needed   alendronate 70 MG tablet Commonly known as:  FOSAMAX Take 70 mg by mouth once a week. Take with a full glass of water on an empty stomach.   aspirin 81 MG tablet Take 81 mg by mouth daily.   atenolol 25 MG tablet Commonly known as:  TENORMIN TAKE 1 TABLET TWICE DAILY.   Biotin 5000 MCG Caps Take 1 capsule by mouth daily.     CALCIUM 600-D 600-400 MG-UNIT Tabs Generic drug:  Calcium Carbonate-Vitamin D3 Take 1 tablet by mouth 2 (two) times daily.   docusate 50 MG/5ML liquid Commonly known as:  COLACE Take 100 mg by mouth at bedtime.   DULoxetine 20 MG capsule Commonly known as:  CYMBALTA TAKE 1 CAPSULE AT BEDTIME.   multivitamin tablet Take 1 tablet by mouth daily.   OVER THE COUNTER MEDICATION Take 1 capsule by mouth daily. FD Gard   polyethylene glycol packet Commonly known as:  MIRALAX / GLYCOLAX Take 17 g by mouth daily as needed.   ranitidine 75 MG/5ML syrup Commonly known as:  ZANTAC Take 10 mLs by mouth 2 (two) times daily.   simvastatin 40 MG tablet Commonly known as:  ZOCOR Take 20 mg by mouth daily.   traMADol 50 MG tablet Commonly known as:  ULTRAM Take 50 mg by mouth every 6 (six) hours as needed for moderate pain.       Review of Systems  Constitutional: Negative for activity change, appetite change, chills, fatigue, fever and unexpected weight change.  HENT: Positive for trouble swallowing. Negative for congestion, rhinorrhea, sinus pressure, sinus pain and sneezing.   Eyes: Negative for redness and itching.       Wears  eye glasses   Respiratory: Negative for cough, chest tightness, shortness of breath and wheezing.   Cardiovascular: Negative for chest pain, palpitations and leg swelling.  Gastrointestinal: Negative for abdominal distention, abdominal pain, diarrhea, nausea and vomiting.       Colace and miralax effective for constipation. Bloating and left lower ABD gurgling per HPI  Endocrine: Negative for cold intolerance, heat intolerance, polydipsia, polyphagia and polyuria.  Genitourinary: Negative for dysuria, flank pain, frequency and urgency.  Musculoskeletal: Positive for arthralgias, back pain and gait problem.  Skin: Negative for color change, pallor, rash and wound.  Neurological: Negative for seizures, syncope, weakness, light-headedness and headaches.        Vertigo symptoms have improved.Neuropathy per HPI   Hematological: Does not bruise/bleed easily.  Psychiatric/Behavioral: Negative for agitation, hallucinations and sleep disturbance. The patient is not nervous/anxious.     Immunization History  Administered Date(s) Administered  . Influenza Split 10/20/2011, 11/03/2012  . Influenza,inj,Quad PF,6+ Mos 09/29/2012, 11/09/2013  . Influenza-Unspecified 10/17/2014, 10/23/2015, 10/20/2016  . PPD Test 08/21/2013, 03/04/2014  . Pneumococcal Conjugate-13 08/21/2013  . Tdap 05/20/2008  . Zoster 11/01/2012   Pertinent  Health Maintenance Due  Topic Date Due  . DEXA SCAN  01/11/2017 (Originally 01/31/1987)  . PNA vac Low Risk Adult (2 of 2 - PPSV23) 01/11/2017 (Originally 08/22/2014)  . INFLUENZA VACCINE  Completed   Fall Risk  09/22/2016 08/25/2016 05/31/2016 09/02/2015 04/29/2015  Falls in the past year? Yes Yes No No No  Number falls in past yr: 1 2 or more - - -  Comment - - - - -  Injury with Fall? Yes Yes - - -  Comment fx 3 ribs - - - -  Risk Factor Category  - - - - -  Risk for fall due to : Impaired mobility;Impaired balance/gait - - - -  Risk for fall due to: Comment - - - - -    Vitals:   11/02/16 1104  BP: 112/60  Pulse: 70  Resp: 20  Temp: 98.7 F (37.1 C)  SpO2: 92%  Weight: 94 lb 4.8 oz (42.8 kg)  Height: 5' (1.524 m)   Body mass index is 18.42 kg/m. Physical Exam  Constitutional: She is oriented to person, place, and time.  Thin frail Elderly in no acute distress  HENT:  Head: Normocephalic.  Right Ear: External ear normal.  Left Ear: External ear normal.  Mouth/Throat: Oropharynx is clear and moist. No oropharyngeal exudate.  Voice tone decreased follow up with ENT   Eyes: Conjunctivae and EOM are normal. Pupils are equal, round, and reactive to light. Right eye exhibits no discharge. Left eye exhibits no discharge. No scleral icterus.  Wears eye glasses  Neck: Normal range of motion. No JVD present. No  thyromegaly present.  Cardiovascular: Exam reveals no gallop and no friction rub.  Murmur heard. HR irregular.Varicose veins on legs.    Pulmonary/Chest: Effort normal and breath sounds normal. No respiratory distress. She has no wheezes. She has no rales.  Abdominal: Soft. She exhibits no distension. There is no tenderness. There is no rebound and no guarding.  Left lower quadrant chronic hyperactive sounds   Genitourinary:  Genitourinary Comments: Continent for bowel and bladder   Musculoskeletal: She exhibits no edema or tenderness.  Moves x 4 extremities. Arthritic changes to fingers noted.Unsteady gait uses walker.   Lymphadenopathy:    She has no cervical adenopathy.  Neurological: She is oriented to person, place, and time. Coordination normal.  Skin: Skin is warm  and dry. No rash noted. No erythema.  Left elbow purple bruise   Psychiatric: She has a normal mood and affect.    Labs reviewed:  Recent Labs  11/06/15 07/01/16 07/29/16 0740  NA 138 138 139  K 4.5 4.0 4.2  CL  --   --  103  CO2  --   --  27  GLUCOSE  --   --  82  BUN 16 12 17   CREATININE 0.8 0.8 0.85  CALCIUM  --   --  8.8    Recent Labs  11/06/15 05/12/16 1304 07/01/16 07/29/16 0740  AST 24  --  22 19  ALT 10  --  11 9  ALKPHOS 99  --  101 80  BILITOT  --   --   --  0.6  PROT  --  6.5  --  5.7*  ALBUMIN  --   --   --  3.2*    Recent Labs  11/06/15 07/01/16  WBC 6.9 9.6  HGB 12.1 12.9  HCT 37 39  PLT 270 296   Lab Results  Component Value Date   TSH 1.59 07/29/2016   Lab Results  Component Value Date   HGBA1C 6.4 07/07/2015   Lab Results  Component Value Date   CHOL 152 07/29/2016   HDL 74 07/29/2016   LDLCALC 63 07/29/2016   TRIG 77 07/29/2016   CHOLHDL 2.1 07/29/2016    Significant Diagnostic Results in last 30 days:  No results found.  Assessment/Plan 1. Chronic right-sided low back pain without sciatica Status post cortisone injection with Ortho 10/07/2016 has upcoming  appointment 11/04/2016 at 2 PM. Has Tramadol 50 mg tablet every 6 hours as needed.Extra strength Tylenol also recommended but declined " does not like to take pain medication. Continue to monitor.   2. Dysphagia occasional coughing episodes during meals.Has seen Speech Therapy. Has up coming follow up appointment with  GI- Dr.Jacobs for possible Barium Esophagram.continue on aspiration precautions.   3. Gastroesophageal reflux disease without esophagitis Continue on Zantac 10 Mls twice daily.   4. Neuropathy Continue on Cymbalta 20 mg Capsule at bedtime.Continue to follow up with Ward Givens NP at [facility].   5. Constipation  Continue on colace and Miralax.Continue to encourage oral intake and hydration.  6. AFib  HR irregular.Not a candidate for coagulation due to hx of GI bleed and advance age.Also not a candidate for the Watchman left atrial appendage occluder device.continue on ASA 81 mg tablet daily.continue to follow up with Cardiologist Dr. Lyman Bishop at [facility].       Family/ staff Communication: Reviewed plan of care with patient and facility Nurse.   Labs/tests ordered: None   Dinah C [name], NP

## 2016-11-02 NOTE — Telephone Encounter (Signed)
Patient notified of the recommendations She wants to wait and will call back when she has her schedule available.

## 2016-11-02 NOTE — Progress Notes (Signed)
Location:  [place] Room Number: 1 Place of Service:  ALF [phone]) Provider: Dinah [name] FNP-C   Blanchie Serve, MD  Patient Care Team: Blanchie Serve, MD as PCP - General (Internal Medicine) Angelia Mould, MD as Attending Physician (Vascular Surgery) [name], MD as Attending Physician (Obstetrics and Gynecology) [name], MD (Orthopedic Surgery) [name], MD (Ophthalmology) [name], MD as Consulting Physician (Urology) [name], MD as Consulting Physician (Cardiology) [name], PA-C as Physician Assistant (Otolaryngology) [name], MD as Consulting Physician (Otolaryngology) [name], MD as Consulting Physician (Dermatology) [name], MD as Consulting Physician (Physical Medicine and Rehabilitation) [name], MD as Attending Physician (Gastroenterology) Mast, Man X, NP as Nurse Practitioner (Internal Medicine) Kathrynn Ducking, MD as Consulting Physician (Neurology) Debara Pickett Nadean Corwin, MD as Consulting Physician (Cardiology) Druscilla Brownie, MD as Consulting Physician (Dermatology)  Extended Emergency Contact Information Primary Emergency Contact: [place] Phone: [phone] Relation: Other Secondary Emergency Contact: Larina [place] Phone: [phone] Mobile Phone: [phone] Relation: Son  Code Status:  Full code  Goals of care: Advanced Directive information Advanced Directives 11/02/2016  Does Patient Have a Medical Advance Directive? Yes  Type of Advance Directive [place]  Does patient want to make changes to medical advance directive? -  Copy of [place] in Chart? Yes  Would patient like information on creating a medical advance directive? -     Chief Complaint  Patient presents with  . Medical Management of Chronic Issues    routine visit    HPI:  Pt is a  83 y.o. female seen today [place] for medical management of chronic diseases.she has a medical history of AFib,GERD, dysphagia, Neuropathy, chronic back pain, Hypothyroidism, OA,OCD among other conditions.She is seen in her room today.She was seen 09/15/2016 by Laurence Slate PA-C at [facility] in [facility] for carotid stenosis surveillance with known left ICA occlusion.Carotid duple right ICA < 40 % no change from study on 09/03/2016 was noted.she is currently on Aspirin antiplatelet therapy, Zocor for hyperlipidemia and Atenolol for HTN.  She was also seen 09/16/2016 by Cardiologist Dr. Lyman Bishop at [facility] for follow up on her chronic Afib.Dr. Debara Pickett noted that she is rate controlled and not on anticoagulation due to GI bleeding in the past and high risk for future bleeding and therefore recommend to continue on aspirin only.Also notes that due to her advance age, she is not a candidate for the Watchman left atrial appendage occluder device.  On 09/22/2016 she was seen at [facility] by  Ward Givens, NP for follow up vertigo and peripheral neuropathy currently on  Cymbalta 20 mg at bedtime with much improvement.NP notes patient was taken off of the gabapentin because it did not help with her neuropathy symptoms and not because of intolerance.Patient to continue on Cymbalta 20 mg Tablet NP plans to increase Cymbalta to 30 mg if symptoms worsen. No issues with vertigo.she will follow up with Neuro in 6 months or sooner if needed.   She was also seen 09/24/2016 by Dr. Owens Loffler at [facility] for follow bloating, gurgling noises worst on left quadrant.Currently on Gas-X and probiotics. She takes liquid colace and Miralax daily.Patient self administers own medication in the facility.She states cuts back on stool softeners whenever her stool is loose.she denies any issues with constipation today.Also sees Dr.Jacobs for dysphagia and  plans for  a Barium esophagram.   She complains of back pain on previous cortisone injection site given by Ortho 10/07/2016.Patient booked an appointment during this visit over the phone with Ortho office for 11/04/2016 at 2 Pm.I've recommended extra strength Tylenol for pain but she states does not like to take pain medication.She would like to explore another option with Ortho.  She denies any fever, chills, cough or urinary tract infections symptoms.She has had no recent fall episodes or weight changes.she does own ADL's.       Past Medical History:  Diagnosis Date  . Abdominal bloating   . Atrial fibrillation ([place])   . Bruises easily   . Carotid artery occlusion    LEFT  . Dizziness   . DOE (dyspnea on exertion) 04/02/2014  . Fall at home Sept 2013, Dec. 2013  Jun 08, 2012  . GERD (gastroesophageal reflux disease) 02/11/2015  . Headache(784.0)   . Hoarseness   . Hypercholesterolemia   . Hyperglycemia 04/02/2014   Glucose 178 mg percent on 01/22/2014 04/05/14 Hgb A1c 6.6 Diet controlled.    . Hypothyroidism 04/15/2015  . Neuropathy    PERIPHERAL  . Pruritus   . Scoliosis   . Varicose veins    Past Surgical History:  Procedure Laterality Date  . ABDOMINAL HYSTERECTOMY  1954  . CHOLECYSTECTOMY  1997  . SPINE SURGERY  1997   correct scoliosis    No Known Allergies  Allergies as of 11/02/2016   No Known Allergies     Medication List       Accurate as of 11/02/16  4:16 PM. Always use your most recent med list.          acetaminophen 500 MG tablet Commonly known as:  TYLENOL Take 500 mg by mouth. Take one tablet every 6 hours as needed   alendronate 70 MG tablet Commonly known as:  FOSAMAX Take 70 mg by mouth once a week. Take with a full glass of water on an empty stomach.   aspirin 81 MG tablet Take 81 mg by mouth daily.   atenolol 25 MG tablet Commonly known as:  TENORMIN TAKE 1 TABLET TWICE DAILY.   Biotin 5000 MCG Caps Take 1 capsule by mouth daily.     CALCIUM 600-D 600-400 MG-UNIT Tabs Generic drug:  Calcium Carbonate-Vitamin D3 Take 1 tablet by mouth 2 (two) times daily.   docusate 50 MG/5ML liquid Commonly known as:  COLACE Take 100 mg by mouth at bedtime.   DULoxetine 20 MG capsule Commonly known as:  CYMBALTA TAKE 1 CAPSULE AT BEDTIME.   multivitamin tablet Take 1 tablet by mouth daily.   OVER THE COUNTER MEDICATION Take 1 capsule by mouth daily. FD Gard   polyethylene glycol packet Commonly known as:  MIRALAX / GLYCOLAX Take 17 g by mouth daily as needed.   ranitidine 75 MG/5ML syrup Commonly known as:  ZANTAC Take 10 mLs by mouth 2 (two) times daily.   simvastatin 40 MG tablet Commonly known as:  ZOCOR Take 20 mg by mouth daily.   traMADol 50 MG tablet Commonly known as:  ULTRAM Take 50 mg by mouth every 6 (six) hours as needed for moderate pain.       Review of Systems  Constitutional: Negative for activity change, appetite change, chills, fatigue, fever and unexpected weight change.  HENT: Positive for trouble swallowing. Negative for congestion, rhinorrhea, sinus pressure, sinus pain and sneezing.   Eyes: Negative for redness and itching.       Wears  eye glasses   Respiratory: Negative for cough, chest tightness, shortness of breath and wheezing.   Cardiovascular: Negative for chest pain, palpitations and leg swelling.  Gastrointestinal: Negative for abdominal distention, abdominal pain, diarrhea, nausea and vomiting.       Colace and miralax effective for constipation. Bloating and left lower ABD gurgling per HPI  Endocrine: Negative for cold intolerance, heat intolerance, polydipsia, polyphagia and polyuria.  Genitourinary: Negative for dysuria, flank pain, frequency and urgency.  Musculoskeletal: Positive for arthralgias, back pain and gait problem.  Skin: Negative for color change, pallor, rash and wound.  Neurological: Negative for seizures, syncope, weakness, light-headedness and headaches.        Vertigo symptoms have improved.Neuropathy per HPI   Hematological: Does not bruise/bleed easily.  Psychiatric/Behavioral: Negative for agitation, hallucinations and sleep disturbance. The patient is not nervous/anxious.     Immunization History  Administered Date(s) Administered  . Influenza Split 10/20/2011, 11/03/2012  . Influenza,inj,Quad PF,6+ Mos 09/29/2012, 11/09/2013  . Influenza-Unspecified 10/17/2014, 10/23/2015, 10/20/2016  . PPD Test 08/21/2013, 03/04/2014  . Pneumococcal Conjugate-13 08/21/2013  . Tdap 05/20/2008  . Zoster 11/01/2012   Pertinent  Health Maintenance Due  Topic Date Due  . DEXA SCAN  01/11/2017 (Originally 01/31/1987)  . PNA vac Low Risk Adult (2 of 2 - PPSV23) 01/11/2017 (Originally 08/22/2014)  . INFLUENZA VACCINE  Completed   Fall Risk  09/22/2016 08/25/2016 05/31/2016 09/02/2015 04/29/2015  Falls in the past year? Yes Yes No No No  Number falls in past yr: 1 2 or more - - -  Comment - - - - -  Injury with Fall? Yes Yes - - -  Comment fx 3 ribs - - - -  Risk Factor Category  - - - - -  Risk for fall due to : Impaired mobility;Impaired balance/gait - - - -  Risk for fall due to: Comment - - - - -    Vitals:   11/02/16 1104  BP: 112/60  Pulse: 70  Resp: 20  Temp: 98.7 F (37.1 C)  SpO2: 92%  Weight: 94 lb 4.8 oz (42.8 kg)  Height: 5' (1.524 m)   Body mass index is 18.42 kg/m. Physical Exam  Constitutional: She is oriented to person, place, and time.  Thin frail Elderly in no acute distress  HENT:  Head: Normocephalic.  Right Ear: External ear normal.  Left Ear: External ear normal.  Mouth/Throat: Oropharynx is clear and moist. No oropharyngeal exudate.  Voice tone decreased follow up with ENT   Eyes: Conjunctivae and EOM are normal. Pupils are equal, round, and reactive to light. Right eye exhibits no discharge. Left eye exhibits no discharge. No scleral icterus.  Wears eye glasses  Neck: Normal range of motion. No JVD present. No  thyromegaly present.  Cardiovascular: Exam reveals no gallop and no friction rub.  Murmur heard. HR irregular.Varicose veins on legs.    Pulmonary/Chest: Effort normal and breath sounds normal. No respiratory distress. She has no wheezes. She has no rales.  Abdominal: Soft. She exhibits no distension. There is no tenderness. There is no rebound and no guarding.  Left lower quadrant chronic hyperactive sounds   Genitourinary:  Genitourinary Comments: Continent for bowel and bladder   Musculoskeletal: She exhibits no edema or tenderness.  Moves x 4 extremities. Arthritic changes to fingers noted.Unsteady gait uses walker.   Lymphadenopathy:    She has no cervical adenopathy.  Neurological: She is oriented to person, place, and time. Coordination normal.  Skin: Skin is warm  and dry. No rash noted. No erythema.  Left elbow purple bruise   Psychiatric: She has a normal mood and affect.    Labs reviewed:  Recent Labs  11/06/15 07/01/16 07/29/16 0740  NA 138 138 139  K 4.5 4.0 4.2  CL  --   --  103  CO2  --   --  27  GLUCOSE  --   --  82  BUN 16 12 17   CREATININE 0.8 0.8 0.85  CALCIUM  --   --  8.8    Recent Labs  11/06/15 05/12/16 1304 07/01/16 07/29/16 0740  AST 24  --  22 19  ALT 10  --  11 9  ALKPHOS 99  --  101 80  BILITOT  --   --   --  0.6  PROT  --  6.5  --  5.7*  ALBUMIN  --   --   --  3.2*    Recent Labs  11/06/15 07/01/16  WBC 6.9 9.6  HGB 12.1 12.9  HCT 37 39  PLT 270 296   Lab Results  Component Value Date   TSH 1.59 07/29/2016   Lab Results  Component Value Date   HGBA1C 6.4 07/07/2015   Lab Results  Component Value Date   CHOL 152 07/29/2016   HDL 74 07/29/2016   LDLCALC 63 07/29/2016   TRIG 77 07/29/2016   CHOLHDL 2.1 07/29/2016    Significant Diagnostic Results in last 30 days:  No results found.  Assessment/Plan 1. Chronic right-sided low back pain without sciatica Status post cortisone injection with Ortho 10/07/2016 has upcoming  appointment 11/04/2016 at 2 PM. Has Tramadol 50 mg tablet every 6 hours as needed.Extra strength Tylenol also recommended but declined " does not like to take pain medication. Continue to monitor.   2. Dysphagia occasional coughing episodes during meals.Has seen Speech Therapy. Has up coming follow up appointment with  GI- Dr.Jacobs for possible Barium Esophagram.continue on aspiration precautions.   3. Gastroesophageal reflux disease without esophagitis Continue on Zantac 10 Mls twice daily.   4. Neuropathy Continue on Cymbalta 20 mg Capsule at bedtime.Continue to follow up with Ward Givens NP at [facility].   5. Constipation  Continue on colace and Miralax.Continue to encourage oral intake and hydration.  6. AFib  HR irregular.Not a candidate for coagulation due to hx of GI bleed and advance age.Also not a candidate for the Watchman left atrial appendage occluder device.continue on ASA 81 mg tablet daily.continue to follow up with Cardiologist Dr. Lyman Bishop at [facility].       Family/ staff Communication: Reviewed plan of care with patient and facility Nurse.   Labs/tests ordered: None   Dinah C [name], NP

## 2016-11-03 ENCOUNTER — Ambulatory Visit: Payer: Medicare Other | Admitting: Neurology

## 2016-11-03 ENCOUNTER — Telehealth: Payer: Self-pay | Admitting: Gastroenterology

## 2016-11-03 NOTE — Telephone Encounter (Signed)
See phone not from yesterday for additional documentation

## 2016-11-03 NOTE — Telephone Encounter (Signed)
Patient wishes to proceed with BS,  I  Explained again that will need BS prior to any decisions being made about EGD.  Patient notified of appt for 11/11/16 arrive at 10:15 .  Patient notified to be NPO after 7:00 am

## 2016-11-04 DIAGNOSIS — M47816 Spondylosis without myelopathy or radiculopathy, lumbar region: Secondary | ICD-10-CM | POA: Diagnosis not present

## 2016-11-04 DIAGNOSIS — M5136 Other intervertebral disc degeneration, lumbar region: Secondary | ICD-10-CM | POA: Diagnosis not present

## 2016-11-04 DIAGNOSIS — M961 Postlaminectomy syndrome, not elsewhere classified: Secondary | ICD-10-CM | POA: Diagnosis not present

## 2016-11-11 ENCOUNTER — Ambulatory Visit (HOSPITAL_COMMUNITY)
Admission: RE | Admit: 2016-11-11 | Discharge: 2016-11-11 | Disposition: A | Discharge status: Home / Self Care | Payer: Medicare Other | Source: Ambulatory Visit | Attending: Gastroenterology | Admitting: Gastroenterology

## 2016-11-11 DIAGNOSIS — R1310 Dysphagia, unspecified: Secondary | ICD-10-CM | POA: Diagnosis not present

## 2016-11-15 ENCOUNTER — Telehealth: Payer: Self-pay | Admitting: Gastroenterology

## 2016-11-15 NOTE — Telephone Encounter (Signed)
The pt has been advised the imaging has not been reviewed as of today and we will call her as soon as resulted

## 2016-11-16 ENCOUNTER — Other Ambulatory Visit: Payer: Self-pay

## 2016-11-16 DIAGNOSIS — R1310 Dysphagia, unspecified: Secondary | ICD-10-CM

## 2016-11-16 NOTE — Telephone Encounter (Signed)
The pt has been scheduled for EGD on 11/25/16 at 1230 pm, pt needs to be instructed

## 2016-11-16 NOTE — Telephone Encounter (Signed)
Patient calling back in regard of this. Best call back # is [phone]. State she is anxious for these results as her other doctors are waiting for results also.

## 2016-11-16 NOTE — Telephone Encounter (Signed)
[name], MD sent to Jeoffrey Massed, RN          Please call the patient. There is a narrowing in her proximal esophagus. This has been present for at least 2years. She needs EGD at [facility] with MAC sedation and dilation, next available. thanks

## 2016-11-16 NOTE — Addendum Note (Signed)
Addended byMarlowe Sax C on: 11/16/2016 12:22 PM   Modules accepted: Level of Service

## 2016-11-17 NOTE — Telephone Encounter (Signed)
The pt has been notified of the EGD on 11/25/16.  She verbalized understanding and will call with any concerns

## 2016-11-18 NOTE — Progress Notes (Addendum)
[place] in regards to collect last H&P EKG ,CXR, med rec. Info for pt. EGD scheduled for 11/25/16 at [facility]  . Also spoke with [name] to review med hx. And instructions . Pt. Stated she was outside standing by her car and she couldn't talk right then and then proceeded to tell me she may postpone the procedure because she had made  A prior appt. On the same day. She was trying to decide which Dr. Visit was more important for her at this time.She had no way to write my number down so I told her I would call her back on Friday 11/19/16 to see if she had made a decision. I also told her if she did decide to postpone Dr. Ardis Hughs office would be who she would contact.

## 2016-11-19 ENCOUNTER — Telehealth: Payer: Self-pay | Admitting: Gastroenterology

## 2016-11-22 NOTE — Telephone Encounter (Signed)
The pt has cancelled appt that was interfering with 11/25/16 EGD.  She will keep appt as scheduled and all questions answered.

## 2016-11-23 ENCOUNTER — Telehealth: Payer: Self-pay | Admitting: Gastroenterology

## 2016-11-24 ENCOUNTER — Telehealth: Payer: Self-pay | Admitting: Gastroenterology

## 2016-11-24 NOTE — Telephone Encounter (Signed)
A user error has taken place: ERROR °

## 2016-11-24 NOTE — Telephone Encounter (Signed)
The pt has been advised she can take meds with small amount of applesauce.

## 2016-11-25 ENCOUNTER — Ambulatory Visit (HOSPITAL_COMMUNITY): Payer: Medicare Other | Admitting: Anesthesiology

## 2016-11-25 ENCOUNTER — Encounter (HOSPITAL_COMMUNITY)
Admission: RE | Disposition: A | Discharge status: Home / Self Care | Payer: Self-pay | Source: Ambulatory Visit | Attending: Gastroenterology

## 2016-11-25 ENCOUNTER — Ambulatory Visit (HOSPITAL_COMMUNITY)
Admission: RE | Admit: 2016-11-25 | Discharge: 2016-11-25 | Disposition: A | Discharge status: Home / Self Care | Payer: Medicare Other | Source: Ambulatory Visit | Attending: Gastroenterology | Admitting: Gastroenterology

## 2016-11-25 ENCOUNTER — Encounter (HOSPITAL_COMMUNITY): Payer: Self-pay | Admitting: Anesthesiology

## 2016-11-25 ENCOUNTER — Other Ambulatory Visit: Payer: Self-pay

## 2016-11-25 ENCOUNTER — Telehealth: Payer: Self-pay

## 2016-11-25 DIAGNOSIS — K5900 Constipation, unspecified: Secondary | ICD-10-CM | POA: Diagnosis not present

## 2016-11-25 DIAGNOSIS — I739 Peripheral vascular disease, unspecified: Secondary | ICD-10-CM | POA: Insufficient documentation

## 2016-11-25 DIAGNOSIS — F429 Obsessive-compulsive disorder, unspecified: Secondary | ICD-10-CM | POA: Insufficient documentation

## 2016-11-25 DIAGNOSIS — K219 Gastro-esophageal reflux disease without esophagitis: Secondary | ICD-10-CM | POA: Insufficient documentation

## 2016-11-25 DIAGNOSIS — K222 Esophageal obstruction: Secondary | ICD-10-CM | POA: Diagnosis not present

## 2016-11-25 DIAGNOSIS — Z79899 Other long term (current) drug therapy: Secondary | ICD-10-CM | POA: Diagnosis not present

## 2016-11-25 DIAGNOSIS — I6523 Occlusion and stenosis of bilateral carotid arteries: Secondary | ICD-10-CM | POA: Insufficient documentation

## 2016-11-25 DIAGNOSIS — G8929 Other chronic pain: Secondary | ICD-10-CM | POA: Insufficient documentation

## 2016-11-25 DIAGNOSIS — Z7982 Long term (current) use of aspirin: Secondary | ICD-10-CM | POA: Diagnosis not present

## 2016-11-25 DIAGNOSIS — Z7902 Long term (current) use of antithrombotics/antiplatelets: Secondary | ICD-10-CM | POA: Diagnosis not present

## 2016-11-25 DIAGNOSIS — I4891 Unspecified atrial fibrillation: Secondary | ICD-10-CM | POA: Diagnosis not present

## 2016-11-25 DIAGNOSIS — M419 Scoliosis, unspecified: Secondary | ICD-10-CM | POA: Diagnosis not present

## 2016-11-25 DIAGNOSIS — I482 Chronic atrial fibrillation: Secondary | ICD-10-CM | POA: Insufficient documentation

## 2016-11-25 DIAGNOSIS — G629 Polyneuropathy, unspecified: Secondary | ICD-10-CM | POA: Diagnosis not present

## 2016-11-25 DIAGNOSIS — M1990 Unspecified osteoarthritis, unspecified site: Secondary | ICD-10-CM | POA: Diagnosis not present

## 2016-11-25 DIAGNOSIS — R1310 Dysphagia, unspecified: Secondary | ICD-10-CM | POA: Diagnosis not present

## 2016-11-25 DIAGNOSIS — I10 Essential (primary) hypertension: Secondary | ICD-10-CM | POA: Diagnosis not present

## 2016-11-25 DIAGNOSIS — M545 Low back pain: Secondary | ICD-10-CM | POA: Diagnosis not present

## 2016-11-25 DIAGNOSIS — E039 Hypothyroidism, unspecified: Secondary | ICD-10-CM | POA: Insufficient documentation

## 2016-11-25 DIAGNOSIS — E7800 Pure hypercholesterolemia, unspecified: Secondary | ICD-10-CM | POA: Diagnosis not present

## 2016-11-25 HISTORY — PX: ESOPHAGOGASTRODUODENOSCOPY (EGD) WITH PROPOFOL: SHX5813

## 2016-11-25 LAB — POCT I-STAT 4, (NA,K, GLUC, HGB,HCT)
Glucose, Bld: 92 mg/dL (ref 65–99)
HCT: 39 % (ref 36.0–46.0)
Hemoglobin: 13.3 g/dL (ref 12.0–15.0)
Potassium: 4.9 mmol/L (ref 3.5–5.1)
Sodium: 139 mmol/L (ref 135–145)

## 2016-11-25 SURGERY — ESOPHAGOGASTRODUODENOSCOPY (EGD) WITH PROPOFOL
Anesthesia: Monitor Anesthesia Care

## 2016-11-25 MED ORDER — LIDOCAINE 2% (20 MG/ML) 5 ML SYRINGE
INTRAMUSCULAR | Status: DC | PRN
  Administered 2016-11-25: 60 mg via INTRAVENOUS

## 2016-11-25 MED ORDER — LACTATED RINGERS IV SOLN
INTRAVENOUS | Status: DC | PRN
  Administered 2016-11-25: 12:00:00 via INTRAVENOUS

## 2016-11-25 MED ORDER — PROPOFOL 10 MG/ML IV BOLUS
INTRAVENOUS | Status: DC | PRN
  Administered 2016-11-25: 10 mg via INTRAVENOUS
  Administered 2016-11-25: 20 mg via INTRAVENOUS

## 2016-11-25 MED ORDER — PROPOFOL 10 MG/ML IV BOLUS
INTRAVENOUS | Status: AC
  Filled 2016-11-25: qty 40

## 2016-11-25 MED ORDER — PROPOFOL 500 MG/50ML IV EMUL
INTRAVENOUS | Status: DC | PRN
  Administered 2016-11-25: 100 ug/kg/min via INTRAVENOUS

## 2016-11-25 MED ORDER — LIDOCAINE 2% (20 MG/ML) 5 ML SYRINGE
INTRAMUSCULAR | Status: AC
  Filled 2016-11-25: qty 5

## 2016-11-25 MED ORDER — SODIUM CHLORIDE 0.9 % IV SOLN: INTRAVENOUS | Status: DC

## 2016-11-25 SURGICAL SUPPLY — 15 items

## 2016-11-25 NOTE — Discharge Instructions (Signed)
YOU HAD AN ENDOSCOPIC PROCEDURE TODAY: Refer to the procedure report and other information in the discharge instructions given to you for any specific questions about what was found during the examination. If this information does not answer your questions, please call [place] office at [phone] to clarify.   YOU SHOULD EXPECT: Some feelings of bloating in the abdomen. Passage of more gas than usual. Walking can help get rid of the air that was put into your GI tract during the procedure and reduce the bloating. If you had a lower endoscopy (such as a colonoscopy or flexible sigmoidoscopy) you may notice spotting of blood in your stool or on the toilet paper. Some abdominal soreness may be present for a day or two, also.  DIET: Your first meal following the procedure should be a light meal and then it is ok to progress to your normal diet. A half-sandwich or bowl of soup is an example of a good first meal. Heavy or fried foods are harder to digest and may make you feel nauseous or bloated. Drink plenty of fluids but you should avoid alcoholic beverages for 24 hours. If you had a esophageal dilation, please see attached instructions for diet.    ACTIVITY: Your care partner should take you home directly after the procedure. You should plan to take it easy, moving slowly for the rest of the day. You can resume normal activity the day after the procedure however YOU SHOULD NOT DRIVE, use power tools, machinery or perform tasks that involve climbing or major physical exertion for 24 hours (because of the sedation medicines used during the test).   SYMPTOMS TO REPORT IMMEDIATELY: A gastroenterologist can be reached at any hour. Please call [phone]  for any of the following symptoms:   Following upper endoscopy (EGD, EUS, ERCP, esophageal dilation) Vomiting of blood or coffee ground material  New, significant abdominal pain  New, significant chest pain or pain under the shoulder blades  Painful or  persistently difficult swallowing  New shortness of breath  Black, tarry-looking or red, bloody stools  FOLLOW UP:  If any biopsies were taken you will be contacted by phone or by letter within the next 1-3 weeks. Call [phone]  if you have not heard about the biopsies in 3 weeks.  Please also call with any specific questions about appointments or follow up tests.  

## 2016-11-25 NOTE — Op Note (Signed)
[facility] Patient Name: Vanessa [name] Procedure Date: 11/25/2016 MRN: [id number] Attending MD: [name] , MD Date of Birth: 01-Aug-1920 CSN: [id number] Age: 83 Admit Type: Outpatient Procedure:                Upper GI endoscopy Indications:              Chronic dysphagia, chronic cricopharyngeal                            stenosis, was offered tertiary ENT referral in past                            by Dr. Cristina Gong she declined. Providers:                [name], MD, Cleda Daub, RN, Corliss Parish, Technician, Dione Booze, CRNA Referring MD:              Medicines:                Monitored Anesthesia Care Complications:            No immediate complications. Estimated blood loss:                            None. Estimated Blood Loss:     Estimated blood loss: none. Procedure:                Pre-Anesthesia Assessment:                           - Prior to the procedure, a History and Physical                            was performed, and patient medications and                            allergies were reviewed. The patient's tolerance of                            previous anesthesia was also reviewed. The risks                            and benefits of the procedure and the sedation                            options and risks were discussed with the patient.                            All questions were answered, and informed consent                            was obtained. Prior Anticoagulants: The patient has  taken no previous anticoagulant or antiplatelet                            agents. ASA Grade Assessment: III - A patient with                            severe systemic disease. After reviewing the risks                            and benefits, the patient was deemed in                            satisfactory condition to undergo the procedure.                           After  obtaining informed consent, the endoscope was                            passed under direct vision. Throughout the                            procedure, the patient's blood pressure, pulse, and                            oxygen saturations were monitored continuously. The                            [id number] ([id number]) scope was introduced through the                            mouth, and advanced to the cricopharyngeal                            esophagus. The [id number]) scope was                            introduced through the and advanced to the. The                            upper GI endoscopy was technically difficult and                            complex. The patient tolerated the procedure well. Scope In: Scope Out: Findings:      I could not advance either an adult or pediatric gastroscope into her       esophagus. There is a tight stenosis at UES/cricopharyngus and what       appears to also be a small adjacent Zenker's diverticulum. I was never       able to convincingly identify the esophageal lumen. Impression:               - Tight stenosis at UES/cricopharygeous and what                            appears to  be an adjacent Zenker's type                            diverticulum.                           - No specimens collected. Moderate Sedation:      N/A- Per Anesthesia Care Recommendation:           - Patient has a contact number available for                            emergencies. The signs and symptoms of potential                            delayed complications were discussed with the                            patient. Return to normal activities tomorrow.                            Written discharge instructions were provided to the                            patient.                           - Puree, full liquid diet only.                           - Continue present medications.                           - I will discuss referral to tertiary GI to                             consider dilation options. I do not feel that I can                            safely help her. Procedure Code(s):        --- Professional ---                           97989, Esophagoscopy, flexible, transoral;                            diagnostic, including collection of specimen(s) by                            brushing or washing, when performed (separate                            procedure) Diagnosis Code(s):        --- Professional ---                           K22.2, Esophageal obstruction  R13.10, Dysphagia, unspecified CPT copyright 2016 American Medical Association. All rights reserved. The codes documented in this report are preliminary and upon coder review may  be revised to meet current compliance requirements. [name], MD 11/25/2016 12:35:58 PM This report has been signed electronically. Number of Addenda: 0

## 2016-11-25 NOTE — Telephone Encounter (Signed)
11/19 at 2:15 pm appt with Dr Ardis Hughs.

## 2016-11-25 NOTE — Transfer of Care (Signed)
Immediate Anesthesia Transfer of Care Note  Patient: Vanessa [name]  Procedure(s) Performed: ESOPHAGOGASTRODUODENOSCOPY (EGD) WITH PROPOFOL (N/A )  Patient Location: PACU and Endoscopy Unit  Anesthesia Type:MAC  Level of Consciousness: sedated and patient cooperative  Airway & Oxygen Therapy: Patient Spontanous Breathing and Patient connected to nasal cannula oxygen  Post-op Assessment: Report given to RN and Post -op Vital signs reviewed and stable  Post vital signs: Reviewed and stable  Last Vitals:  Vitals:   11/25/16 1140  BP: (!) 162/81  Pulse: 71  Resp: 14  Temp: 36.6 C  SpO2: 100%    Last Pain:  Vitals:   11/25/16 1140  TempSrc: Oral         Complications: No apparent anesthesia complications

## 2016-11-25 NOTE — Anesthesia Preprocedure Evaluation (Addendum)
Anesthesia Evaluation  Patient identified by MRN, date of birth, ID band Patient awake    Reviewed: Allergy & Precautions, NPO status , Patient's Chart, lab work & pertinent test results  Airway Mallampati: III  TM Distance: >3 FB Neck ROM: Limited    Dental  (+) Missing, Partial Upper, Partial Lower, Caps   Pulmonary neg pulmonary ROS,  Hoarseness    Pulmonary exam normal breath sounds clear to auscultation       Cardiovascular + Peripheral Vascular Disease and + DOE  Normal cardiovascular exam+ dysrhythmias Atrial Fibrillation  Rhythm:Regular Rate:Normal     Neuro/Psych  Headaches, PSYCHIATRIC DISORDERS Anxiety    GI/Hepatic GERD  Medicated and Controlled,Dysphagia    Endo/Other  Hypothyroidism   Renal/GU   negative genitourinary   Musculoskeletal  (+) Arthritis , Osteoarthritis,    Abdominal   Peds  Hematology negative hematology ROS (+)   Anesthesia Other Findings   Reproductive/Obstetrics                            Anesthesia Physical Anesthesia Plan  ASA: III  Anesthesia Plan: MAC   Post-op Pain Management:    Induction: Intravenous  PONV Risk Score and Plan: 2 and Treatment may vary due to age or medical condition, Ondansetron and Propofol infusion  Airway Management Planned: Natural Airway and Nasal Cannula  Additional Equipment:   Intra-op Plan:   Post-operative Plan:   Informed Consent: I have reviewed the patients History and Physical, chart, labs and discussed the procedure including the risks, benefits and alternatives for the proposed anesthesia with the patient or authorized representative who has indicated his/her understanding and acceptance.     Plan Discussed with: CRNA, Anesthesiologist and Surgeon  Anesthesia Plan Comments:         Anesthesia Quick Evaluation

## 2016-11-25 NOTE — Telephone Encounter (Signed)
-----   Message from [name], MD sent at 11/25/2016 12:55 PM EST ----- She needs my next available ROV (wait list).  To discuss upper esophagus stenosis, consider tertiary referral. Thanks

## 2016-11-25 NOTE — Interval H&P Note (Signed)
History and Physical Interval Note:  11/25/2016 11:48 AM  Vanessa [name]  has presented today for surgery, with the diagnosis of narrowing esophagus  The various methods of treatment have been discussed with the patient and family. After consideration of risks, benefits and other options for treatment, the patient has consented to  Procedure(s): ESOPHAGOGASTRODUODENOSCOPY (EGD) WITH PROPOFOL (N/A) as a surgical intervention .  The patient's history has been reviewed, patient examined, no change in status, stable for surgery.  I have reviewed the patient's chart and labs.  Questions were answered to the patient's satisfaction.     [name]

## 2016-11-25 NOTE — Anesthesia Procedure Notes (Signed)
Procedure Name: MAC Date/Time: 11/25/2016 12:12 PM Performed by: Dione Booze, CRNA Pre-anesthesia Checklist: Patient identified, Emergency Drugs available, Suction available and Patient being monitored Oxygen Delivery Method: Nasal cannula Placement Confirmation: positive ETCO2

## 2016-11-25 NOTE — Anesthesia Postprocedure Evaluation (Signed)
Anesthesia Post Note  Patient: Eulah Citizen  Procedure(s) Performed: ESOPHAGOGASTRODUODENOSCOPY (EGD) WITH PROPOFOL (N/A )     Patient location during evaluation: PACU Anesthesia Type: MAC Level of consciousness: awake and alert and oriented Pain management: pain level controlled Vital Signs Assessment: post-procedure vital signs reviewed and stable Respiratory status: spontaneous breathing, nonlabored ventilation and respiratory function stable Cardiovascular status: stable and blood pressure returned to baseline Postop Assessment: no apparent nausea or vomiting Anesthetic complications: no    Last Vitals:  Vitals:   11/25/16 1238 11/25/16 1243  BP: (!) 102/59   Pulse: 75   Resp: 17   Temp:  36.5 C  SpO2: 100%     Last Pain:  Vitals:   11/25/16 1243  TempSrc: Oral                 Charlen Bakula A.

## 2016-11-25 NOTE — Telephone Encounter (Signed)
The pt has been advised of the upcoming appt.

## 2016-11-26 ENCOUNTER — Encounter (HOSPITAL_COMMUNITY): Payer: Self-pay | Admitting: Gastroenterology

## 2016-11-29 ENCOUNTER — Encounter: Payer: Self-pay | Admitting: Gastroenterology

## 2016-11-29 ENCOUNTER — Ambulatory Visit (INDEPENDENT_AMBULATORY_CARE_PROVIDER_SITE_OTHER): Payer: Medicare Other | Admitting: Gastroenterology

## 2016-11-29 VITALS — BP 86/58 | HR 62 | Ht 60.0 in | Wt 92.1 lb

## 2016-11-29 DIAGNOSIS — I6523 Occlusion and stenosis of bilateral carotid arteries: Secondary | ICD-10-CM | POA: Diagnosis not present

## 2016-11-29 DIAGNOSIS — R1310 Dysphagia, unspecified: Secondary | ICD-10-CM | POA: Diagnosis not present

## 2016-11-29 NOTE — Patient Instructions (Addendum)
Full liquid diet or puree diet at most.  You should not be eating anything you have to chew.  If you change your mind about ENT Referral Dr. Rowe Clack at [facility] to reconsider dilation that they offered you 2 years ago.  Normal BMI (Body Mass Index- based on height and weight) is between 23 and 30. Your BMI today is Body mass index is 17.99 kg/m. Marland Kitchen Please consider follow up  regarding your BMI with your Primary Care Provider.

## 2016-11-29 NOTE — Progress Notes (Signed)
Review of pertinent gastrointestinal problems: 1. Dyspepsia, bloating, chronic:Many years patient of Dr. Cristina Gong established care [facility] 2017; Symptoms possibly functional. I offered several times but she declined, 83 years given advanced age, frailty.Conservative measures: Gas ex. PPI. FD Guard samples helped but she has trouble affording them. H. Pylori stool Ag 01/2016 negative; CBC, CMET normal 2017.  2. 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 This impeded passage of barium tablet for 5 minutes.Mild gastroesophageal reflux. 12/2011 Speech, language notes:Dysphagia Diagnosis: Moderate cervical esophageal phase dysphagia Clinical impression: Patient exhibits a normal orophayrngeal swallow, but a moderated cervical esophageal dysphagia, with a prominent cricopharyngeus, and a small Zenkker's diverticulm below the UES. The pill lodged sideways near the level of the Zenker's, causing discomfort and anxiety to the patient. Patient reports this occurs with meats and occassionally pills at home. After 5 minutes and multiple swallows of puree, water, and nectar, the pill finally dissolved enough to pass. Previously documented esophageal motility disorder persists. GI f/u with Dr. Cristina Gong may be beneficial. Dr. Alvester Chou, Radiologist, observed view of pill that was lodged, and observed patient take 3 swallows of barium after pill passed. He agrees with above. In 2016 she was referred to [facility] ear nose and throat, she was offered a dilation procedure.  She declined. 11/2016 Barium esophagram: 1. Persistent abnormal relaxation of the cricopharyngeus withhigh-grade narrowing at the thoracic inlet. No associated diverticulum, aspiration, or stasis. 2. Otherwise negative study 11/2016 EGD Dr. Ardis Hughs: unable to intubate the esophagus due to small Zenkers, tight cricopharyngous.    HPI: This  is a 83 year old woman who is here with her son today  I did EGD for her last week.  See that result above.  I was unable to intubate her esophagus due to a tight cricopharyngeus and proximal Zenker's diverticulum.  Tells me that some of her nurses at her home have been insisting she get something done about her swallowing but she thinks she may be too old for any procedures and "should just deal with it."   Chief complaint is dysphagia, dyspepsia, gurgling noises  ROS: complete GI ROS as described in HPI, all other review negative.  Constitutional:  She weighed 101 pounds 04/2016, today 92 pounds   Past Medical History:  Diagnosis Date  . Abdominal bloating   . Atrial fibrillation ([place])   . Bruises easily   . Carotid artery occlusion    LEFT  . Dizziness   . DOE (dyspnea on exertion) 04/02/2014  . Fall at home Sept 2013, Dec. 2013  Jun 08, 2012  . GERD (gastroesophageal reflux disease) 02/11/2015  . Headache(784.0)   . Hoarseness   . Hypercholesterolemia   . Hyperglycemia 04/02/2014   Glucose 178 mg percent on 01/22/2014 04/05/14 Hgb A1c 6.6 Diet controlled.    . Hypothyroidism 04/15/2015  . Neuropathy    PERIPHERAL  . Pruritus   . Scoliosis   . Varicose veins     Past Surgical History:  Procedure Laterality Date  . ABDOMINAL HYSTERECTOMY  1954  . CHOLECYSTECTOMY  1997  . ESOPHAGOGASTRODUODENOSCOPY (EGD) WITH PROPOFOL N/A 11/25/2016   Performed by [name], MD at [place]  . SPINE SURGERY  1997   correct scoliosis    Current Outpatient Medications  Medication Sig Dispense Refill  . acetaminophen (TYLENOL) 500 MG tablet Take 500 mg every 6 (six) hours as needed by mouth (for pain/back pain.).     Marland Kitchen aspirin  EC 81 MG tablet Take 81 mg daily by mouth.    Marland Kitchen atenolol (TENORMIN) 25 MG tablet TAKE 1 TABLET TWICE DAILY. 180 tablet 2  . Biotin 5000 MCG CAPS Take 5,000 mcg daily by mouth.     . Calcium-Vitamin D-Vitamin K (VIACTIV) [id number] MG-UNT-MCG CHEW Chew 1  tablet daily by mouth.    . DULoxetine (CYMBALTA) 20 MG capsule TAKE 1 CAPSULE AT BEDTIME. 30 capsule 11  . Emollient (ALBOLENE EX) Apply 1 application at bedtime topically.    . multivitamin-iron-minerals-folic acid (CENTRUM) chewable tablet Chew 1 tablet daily by mouth.    . polyethylene glycol (MIRALAX / GLYCOLAX) packet Take 17 g daily as needed by mouth (for constipation).     . simvastatin (ZOCOR) 40 MG tablet Take 20 mg at bedtime by mouth.     . triamcinolone cream (KENALOG) 0.1 % Apply 1 application 2 (two) times daily as needed topically (for itchy back).     No current facility-administered medications for this visit.     Allergies as of 11/29/2016  . (No Known Allergies)    Family History  Adopted: Yes  Problem Relation Age of Onset  . Diabetes Mother   . Heart attack Mother   . Heart disease Mother        After age 5  . Heart attack Father   . Stroke Father   . Breast cancer Sister   . Heart disease Maternal Grandmother   . Cancer Son        adopted son. esophagus, stomach, liver  . Stomach cancer Neg Hx   . Colon cancer Neg Hx     Social History   Socioeconomic History  . Marital status: Widowed    Spouse name: Not on file  . Number of children: 2  . Years of education: 12+  . Highest education level: Not on file  Social Needs  . Financial resource strain: Not on file  . Food insecurity - worry: Not on file  . Food insecurity - inability: Not on file  . Transportation needs - medical: Not on file  . Transportation needs - non-medical: Not on file  Occupational History  . 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 Smoking status: Never Smoker  . Smokeless tobacco: Never Used  Substance and Sexual Activity  . Alcohol use: No    Alcohol/week: 0.0 oz  . Drug use: No  . Sexual activity: No    Birth control/protection: None  Other Topics Concern  . Not on file  Social History Narrative   Patient  lives at [facility] 03/05/2014   Patient is a widowed.    Patient is retired.    Patient has no children but adopted twin sons.    Patient has a college degree.    Never smoked   Alcohol none   Exercise with therapy   Walks with cane   POA       Patient only drinks de-caf drinks.   Patient is right handed.     Physical Exam: BP (!) 86/58   Pulse 62   Ht 5' (1.524 m)   Wt 92 lb 2 oz (41.8 kg)   BMI 17.99 kg/m  Constitutional: Frail, elderly woman Psychiatric: alert and oriented x3 Abdomen: soft, nontender, nondistended, no obvious ascites, no peritoneal signs, normal bowel sounds No peripheral edema noted in lower extremities  Assessment and plan: 83 y.o. 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  This is been known for at least 2-3 years.  She actually met with ENT about it as well as ENT in [facility] about this.  Her son was with her for that appointment and he recalls very clearly that she was offered dilation by her [facility] ENT doctor but she declined.  We discussed it again and she does again declines any treatment for it.  She does not want to be referred back to that ENT doctor.  I explained that from my perspective it is probably safe that she only eat foods which do not require any chewing such as full liquid, pure diet.  She understands and will continue that diet going forward and will call if she reconsiders referral back to her ENT at [facility]  Please see the "Patient Instructions" section for addition details about the plan.  Owens Loffler, MD [facility] 11/29/2016, 2:16 PM

## 2016-12-01 ENCOUNTER — Emergency Department (HOSPITAL_COMMUNITY): Payer: Medicare Other

## 2016-12-01 ENCOUNTER — Inpatient Hospital Stay (HOSPITAL_COMMUNITY)
Admission: EM | Admit: 2016-12-01 | Discharge: 2016-12-07 | DRG: 308 | Disposition: A | Discharge status: Skilled Nursing Facility | Payer: Medicare Other | Attending: Internal Medicine | Admitting: Internal Medicine

## 2016-12-01 ENCOUNTER — Other Ambulatory Visit: Payer: Self-pay

## 2016-12-01 ENCOUNTER — Encounter (HOSPITAL_COMMUNITY): Payer: Self-pay | Admitting: Emergency Medicine

## 2016-12-01 DIAGNOSIS — Z803 Family history of malignant neoplasm of breast: Secondary | ICD-10-CM

## 2016-12-01 DIAGNOSIS — S0081XA Abrasion of other part of head, initial encounter: Secondary | ICD-10-CM | POA: Diagnosis not present

## 2016-12-01 DIAGNOSIS — M549 Dorsalgia, unspecified: Secondary | ICD-10-CM | POA: Diagnosis present

## 2016-12-01 DIAGNOSIS — Y92007 Garden or yard of unspecified non-institutional (private) residence as the place of occurrence of the external cause: Secondary | ICD-10-CM

## 2016-12-01 DIAGNOSIS — E7800 Pure hypercholesterolemia, unspecified: Secondary | ICD-10-CM | POA: Diagnosis present

## 2016-12-01 DIAGNOSIS — W19XXXA Unspecified fall, initial encounter: Secondary | ICD-10-CM | POA: Diagnosis present

## 2016-12-01 DIAGNOSIS — M4125 Other idiopathic scoliosis, thoracolumbar region: Secondary | ICD-10-CM | POA: Diagnosis present

## 2016-12-01 DIAGNOSIS — K219 Gastro-esophageal reflux disease without esophagitis: Secondary | ICD-10-CM | POA: Diagnosis present

## 2016-12-01 DIAGNOSIS — S0990XA Unspecified injury of head, initial encounter: Secondary | ICD-10-CM | POA: Diagnosis not present

## 2016-12-01 DIAGNOSIS — W0110XA Fall on same level from slipping, tripping and stumbling with subsequent striking against unspecified object, initial encounter: Secondary | ICD-10-CM | POA: Diagnosis present

## 2016-12-01 DIAGNOSIS — E43 Unspecified severe protein-calorie malnutrition: Secondary | ICD-10-CM

## 2016-12-01 DIAGNOSIS — S299XXA Unspecified injury of thorax, initial encounter: Secondary | ICD-10-CM | POA: Diagnosis not present

## 2016-12-01 DIAGNOSIS — S5012XA Contusion of left forearm, initial encounter: Secondary | ICD-10-CM | POA: Diagnosis not present

## 2016-12-01 DIAGNOSIS — S022XXA Fracture of nasal bones, initial encounter for closed fracture: Secondary | ICD-10-CM | POA: Diagnosis present

## 2016-12-01 DIAGNOSIS — S50912A Unspecified superficial injury of left forearm, initial encounter: Secondary | ICD-10-CM | POA: Diagnosis not present

## 2016-12-01 DIAGNOSIS — Z9049 Acquired absence of other specified parts of digestive tract: Secondary | ICD-10-CM

## 2016-12-01 DIAGNOSIS — Z823 Family history of stroke: Secondary | ICD-10-CM

## 2016-12-01 DIAGNOSIS — I482 Chronic atrial fibrillation: Principal | ICD-10-CM | POA: Diagnosis present

## 2016-12-01 DIAGNOSIS — Y9301 Activity, walking, marching and hiking: Secondary | ICD-10-CM | POA: Diagnosis present

## 2016-12-01 DIAGNOSIS — E039 Hypothyroidism, unspecified: Secondary | ICD-10-CM | POA: Diagnosis present

## 2016-12-01 DIAGNOSIS — S80912A Unspecified superficial injury of left knee, initial encounter: Secondary | ICD-10-CM | POA: Diagnosis not present

## 2016-12-01 DIAGNOSIS — I6522 Occlusion and stenosis of left carotid artery: Secondary | ICD-10-CM | POA: Diagnosis present

## 2016-12-01 DIAGNOSIS — Z833 Family history of diabetes mellitus: Secondary | ICD-10-CM

## 2016-12-01 DIAGNOSIS — Z79899 Other long term (current) drug therapy: Secondary | ICD-10-CM

## 2016-12-01 DIAGNOSIS — Z9181 History of falling: Secondary | ICD-10-CM

## 2016-12-01 DIAGNOSIS — Z981 Arthrodesis status: Secondary | ICD-10-CM

## 2016-12-01 DIAGNOSIS — I959 Hypotension, unspecified: Secondary | ICD-10-CM | POA: Diagnosis not present

## 2016-12-01 DIAGNOSIS — G8929 Other chronic pain: Secondary | ICD-10-CM | POA: Diagnosis present

## 2016-12-01 DIAGNOSIS — Z8249 Family history of ischemic heart disease and other diseases of the circulatory system: Secondary | ICD-10-CM

## 2016-12-01 DIAGNOSIS — I4891 Unspecified atrial fibrillation: Secondary | ICD-10-CM | POA: Diagnosis present

## 2016-12-01 DIAGNOSIS — Z7982 Long term (current) use of aspirin: Secondary | ICD-10-CM

## 2016-12-01 DIAGNOSIS — S0181XA Laceration without foreign body of other part of head, initial encounter: Secondary | ICD-10-CM | POA: Diagnosis not present

## 2016-12-01 DIAGNOSIS — Z7952 Long term (current) use of systemic steroids: Secondary | ICD-10-CM

## 2016-12-01 DIAGNOSIS — R9431 Abnormal electrocardiogram [ECG] [EKG]: Secondary | ICD-10-CM | POA: Diagnosis not present

## 2016-12-01 DIAGNOSIS — S8002XA Contusion of left knee, initial encounter: Secondary | ICD-10-CM | POA: Diagnosis not present

## 2016-12-01 DIAGNOSIS — S0083XA Contusion of other part of head, initial encounter: Secondary | ICD-10-CM | POA: Diagnosis present

## 2016-12-01 DIAGNOSIS — Z90710 Acquired absence of both cervix and uterus: Secondary | ICD-10-CM

## 2016-12-01 DIAGNOSIS — S61412A Laceration without foreign body of left hand, initial encounter: Secondary | ICD-10-CM | POA: Diagnosis present

## 2016-12-01 DIAGNOSIS — E785 Hyperlipidemia, unspecified: Secondary | ICD-10-CM | POA: Diagnosis present

## 2016-12-01 DIAGNOSIS — S199XXA Unspecified injury of neck, initial encounter: Secondary | ICD-10-CM | POA: Diagnosis not present

## 2016-12-01 DIAGNOSIS — R0902 Hypoxemia: Secondary | ICD-10-CM

## 2016-12-01 DIAGNOSIS — I8390 Asymptomatic varicose veins of unspecified lower extremity: Secondary | ICD-10-CM | POA: Diagnosis present

## 2016-12-01 DIAGNOSIS — R220 Localized swelling, mass and lump, head: Secondary | ICD-10-CM | POA: Diagnosis not present

## 2016-12-01 DIAGNOSIS — S0180XA Unspecified open wound of other part of head, initial encounter: Secondary | ICD-10-CM | POA: Diagnosis not present

## 2016-12-01 DIAGNOSIS — S51812A Laceration without foreign body of left forearm, initial encounter: Secondary | ICD-10-CM | POA: Diagnosis present

## 2016-12-01 LAB — URINALYSIS, ROUTINE W REFLEX MICROSCOPIC
Bilirubin Urine: NEGATIVE
Glucose, UA: NEGATIVE mg/dL
Hgb urine dipstick: NEGATIVE
Ketones, ur: NEGATIVE mg/dL
Nitrite: NEGATIVE
Protein, ur: NEGATIVE mg/dL
Specific Gravity, Urine: 1.012 (ref 1.005–1.030)
pH: 7 (ref 5.0–8.0)

## 2016-12-01 LAB — CBC WITH DIFFERENTIAL/PLATELET
Basophils Absolute: 0 10*3/uL (ref 0.0–0.1)
Basophils Relative: 0 %
Eosinophils Absolute: 0.1 10*3/uL (ref 0.0–0.7)
Eosinophils Relative: 1 %
HCT: 42 % (ref 36.0–46.0)
Hemoglobin: 13.8 g/dL (ref 12.0–15.0)
Lymphocytes Relative: 15 %
Lymphs Abs: 1.3 10*3/uL (ref 0.7–4.0)
MCH: 32.3 pg (ref 26.0–34.0)
MCHC: 32.9 g/dL (ref 30.0–36.0)
MCV: 98.4 fL (ref 78.0–100.0)
Monocytes Absolute: 0.9 10*3/uL (ref 0.1–1.0)
Monocytes Relative: 10 %
Neutro Abs: 6.3 10*3/uL (ref 1.7–7.7)
Neutrophils Relative %: 74 %
Platelets: 221 10*3/uL (ref 150–400)
RBC: 4.27 MIL/uL (ref 3.87–5.11)
RDW: 13.8 % (ref 11.5–15.5)
WBC: 8.4 10*3/uL (ref 4.0–10.5)

## 2016-12-01 LAB — BASIC METABOLIC PANEL
Anion gap: 8 (ref 5–15)
BUN: 17 mg/dL (ref 6–20)
CO2: 29 mmol/L (ref 22–32)
Calcium: 9.4 mg/dL (ref 8.9–10.3)
Chloride: 100 mmol/L — ABNORMAL LOW (ref 101–111)
Creatinine, Ser: 0.77 mg/dL (ref 0.44–1.00)
GFR calc Af Amer: >60 mL/min (ref >60)
GFR calc non Af Amer: >60 mL/min (ref >60)
Glucose, Bld: 108 mg/dL — ABNORMAL HIGH (ref 65–99)
Potassium: 4.6 mmol/L (ref 3.5–5.1)
Sodium: 137 mmol/L (ref 135–145)

## 2016-12-01 MED ORDER — ACETAMINOPHEN 325 MG PO TABS
650.0000 mg | ORAL_TABLET | Freq: Once | ORAL | Status: AC
  Administered 2016-12-01: 650 mg via ORAL
  Filled 2016-12-01: qty 2

## 2016-12-01 MED ORDER — ONDANSETRON 4 MG PO TBDP
4.0000 mg | ORAL_TABLET | Freq: Once | ORAL | Status: AC
  Administered 2016-12-01: 4 mg via ORAL
  Filled 2016-12-01: qty 1

## 2016-12-01 MED ORDER — LIDOCAINE HCL (PF) 1 % IJ SOLN
5.0000 mL | Freq: Once | INTRAMUSCULAR | Status: AC
  Administered 2016-12-01: 5 mL via INTRADERMAL
  Filled 2016-12-01: qty 5

## 2016-12-01 MED ORDER — LIDOCAINE-EPINEPHRINE-TETRACAINE (LET) SOLUTION
3.0000 mL | Freq: Once | NASAL | Status: AC
  Administered 2016-12-01: 3 mL via TOPICAL
  Filled 2016-12-01: qty 3

## 2016-12-01 MED ORDER — LIDOCAINE-EPINEPHRINE (PF) 2 %-1:200000 IJ SOLN
10.0000 mL | Freq: Once | INTRAMUSCULAR | Status: AC
  Administered 2016-12-01: 10 mL
  Filled 2016-12-01: qty 20

## 2016-12-01 NOTE — ED Notes (Signed)
Patient transported to CT 

## 2016-12-01 NOTE — ED Triage Notes (Signed)
Pt tripped in the parking lot while visiting a friend and landed on her face. Pt a&ox4 per EMS. Unknown for how long pt had been down outside. Denies LOC, bleeding from her nose, and a hematoma/possible laceration on her forehead and the back of her head, c/o pain to her head. C collar placed in ED

## 2016-12-01 NOTE — ED Notes (Signed)
ED Provider at bedside. 

## 2016-12-01 NOTE — ED Provider Notes (Signed)
[facility] EMERGENCY DEPARTMENT Provider Note   CSN: [id number] Arrival date & time:        History   Chief Complaint Chief Complaint  Patient presents with  . Fall    HPI Vanessa [name] is a 83 y.o. female.  The history is provided by the patient and the EMS personnel. No language interpreter was used.  Fall     Vanessa [name] is a 83 y.o. female who presents to the Emergency Department complaining of fall.  She presents for evaluation of injuries following a fall.  She is a resident at the friend's home she was outside walking when she tripped and fell, striking her head.  She is not sure if she lost consciousness or not.  She was outside for an unknown amount of time but she feels like she was calling for help for at least 30 minutes.  She reports very minimal pain but does have some soreness to her head.  She has chronic back pain and abdominal pain that are unchanged from baseline.  Past Medical History:  Diagnosis Date  . Abdominal bloating   . Atrial fibrillation ([place])   . Bruises easily   . Carotid artery occlusion    LEFT  . Dizziness   . DOE (dyspnea on exertion) 04/02/2014  . Fall at home Sept 2013, Dec. 2013  Jun 08, 2012  . GERD (gastroesophageal reflux disease) 02/11/2015  . Headache(784.0)   . Hoarseness   . Hypercholesterolemia   . Hyperglycemia 04/02/2014   Glucose 178 mg percent on 01/22/2014 04/05/14 Hgb A1c 6.6 Diet controlled.    . Hypothyroidism 04/15/2015  . Neuropathy    PERIPHERAL  . Pruritus   . Scoliosis   . Varicose veins     Patient Active Problem List   Diagnosis Date Noted  . Atrial fibrillation with RVR ([place]) 12/02/2016  . Esophageal stenosis   . History of rib fracture 08/04/2016  . Age-related osteoporosis without current pathological fracture 08/04/2016  . Paresthesia 05/12/2016  . Abnormality of gait 04/19/2016  . Urinary frequency 12/09/2015  . OCD (obsessive compulsive disorder) 09/02/2015  . Chest  pain 06/12/2015  . Hypothyroidism 04/15/2015  . Suprapubic pain 04/01/2015  . GERD (gastroesophageal reflux disease) 02/11/2015  . Constipation 02/11/2015  . Aspiration into respiratory tract 08/27/2014  . Cricopharyngeal hypertrophy 08/27/2014  . Muscle tension dysphonia 08/27/2014  . 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 Low back pain 08/13/2014  . Carotid stenosis 08/13/2014  . Hoarse 08/13/2014  . Xerostomia 06/18/2014  . Prickling sensation-Left Leg 06/05/2014  . Tingling sensation-Left Leg 06/05/2014  . Swelling of ankle 06/05/2014  . Hyperglycemia 04/02/2014  . Loss of weight 04/02/2014  . DOE (dyspnea on exertion) 04/02/2014  . Idiopathic scoliosis 04/02/2014  . Hearing loss 04/02/2014  . Dysphagia 04/02/2014  . Seborrheic keratoses, inflamed 04/02/2014  . Palliative care encounter 09/12/2013  . Weakness generalized 09/12/2013  . Anxiety state 09/12/2013  . GI bleed 09/10/2013  . Other and unspecified ovarian cyst 09/10/2013  . Fall 09/03/2013  . PVD (peripheral vascular disease) ([place]) 02/21/2013  . Occlusion of left internal carotid artery 08/16/2012  . Varicose veins 12/22/2011  . Malaise and fatigue 10/06/2011  . Raynaud phenomenon 06/26/2010  . Osteoarthritis 06/26/2010  . Atrial fibrillation ([place])   . Neuropathy   . Hypercholesterolemia   . Headache   . Pruritus     Past Surgical History:  Procedure Laterality Date  . ABDOMINAL HYSTERECTOMY  1954  .  CHOLECYSTECTOMY  1997  . ESOPHAGOGASTRODUODENOSCOPY (EGD) WITH PROPOFOL N/A 11/25/2016   Procedure: ESOPHAGOGASTRODUODENOSCOPY (EGD) WITH PROPOFOL;  Surgeon: [name], MD;  Location: [facility] END[facility];  Service: Endoscopy;  Laterality: N/A;  . [place]   correct scoliosis    OB History    No data available       Home Medications    Prior to Admission medications   Medication Sig Start Date End Date Taking? Authorizing Provider  acetaminophen (TYLENOL) 500 MG tablet Take 500 mg every 6  (six) hours as needed by mouth (for pain/back pain.).     [provider]  aspirin EC 81 MG tablet Take 81 mg daily by mouth.    [provider]  atenolol (TENORMIN) 25 MG tablet TAKE 1 TABLET TWICE DAILY. 06/30/16   Pixie Casino, MD  Biotin 5000 MCG CAPS Take 5,000 mcg daily by mouth.     [provider]  Calcium-Vitamin D-Vitamin K (VIACTIV) [phone] MG-UNT-MCG CHEW Chew 1 tablet daily by mouth.    [provider]  DULoxetine (CYMBALTA) 20 MG capsule TAKE 1 CAPSULE AT BEDTIME. 10/04/16   Ward Givens, NP  Emollient (ALBOLENE EX) Apply 1 application at bedtime topically.    [provider]  multivitamin-iron-minerals-folic acid (CENTRUM) chewable tablet Chew 1 tablet daily by mouth.    [provider]  polyethylene glycol (MIRALAX / GLYCOLAX) packet Take 17 g daily as needed by mouth (for constipation).     [provider]  simvastatin (ZOCOR) 40 MG tablet Take 20 mg at bedtime by mouth.     [provider]  triamcinolone cream (KENALOG) 0.1 % Apply 1 application 2 (two) times daily as needed topically (for itchy back).    [provider]    Family History Family History  Adopted: Yes  Problem Relation Age of Onset  . Diabetes Mother   . Heart attack Mother   . Heart disease Mother        After age 53  . Heart attack Father   . Stroke Father   . Breast cancer Sister   . Heart disease Maternal Grandmother   . Cancer Son        adopted son. esophagus, stomach, liver  . Stomach cancer Neg Hx   . Colon cancer Neg Hx     Social History Social History   Tobacco Use  . Smoking status: Never Smoker  . Smokeless tobacco: Never Used  Substance Use Topics  . Alcohol use: No    Alcohol/week: 0.0 oz  . Drug use: No     Allergies   Patient has no known allergies.   Review of Systems Review of Systems  All other systems reviewed and are negative.    Physical Exam Updated Vital Signs BP  (!) 119/50   Pulse (!) 113   Temp 98.9 F (37.2 C) (Oral)   Resp 13   Ht 5' (1.524 m)   Wt 41.7 kg (92 lb)   SpO2 94%   BMI 17.97 kg/m   Physical Exam  Constitutional: She is oriented to person, place, and time. She appears well-developed and well-nourished.  HENT:  Head: Normocephalic.  Multiple facial abrasions.  Abrasion and laceration over the left forehead.  Abrasion over the tip of the nose.  Eyes: EOM are normal. Pupils are equal, round, and reactive to light.  Neck:  No C-spine tenderness  Cardiovascular: Normal rate and regular rhythm.  No murmur heard. Pulmonary/Chest: Effort normal and breath  sounds normal. No respiratory distress.  Abdominal: Soft. There is no tenderness. There is no rebound and no guarding.  Musculoskeletal:  There is ecchymosis and tenderness over the left knee with flexion extension intact.  There is a small skin tear above the left knee.  There is a large ecchymosis over the left forearm with moderate left forearm tenderness to palpation.  There is no tenderness over the hands but there is a small Skin tear over the left hand.  Neurological: She is alert and oriented to person, place, and time.  5 out of 5 strength in all 4 extremities with sensation to light touch intact in all 4 extremities  Skin: Skin is warm and dry.  Psychiatric: She has a normal mood and affect. Her behavior is normal.  Nursing note and vitals reviewed.    ED Treatments / Results  Labs (all labs ordered are listed, but only abnormal results are displayed) Labs Reviewed  BASIC METABOLIC PANEL - Abnormal; Notable for the following components:      Result Value   Chloride 100 (*)    Glucose, Bld 108 (*)    All other components within normal limits  URINALYSIS, ROUTINE W REFLEX MICROSCOPIC - Abnormal; Notable for the following components:   Leukocytes, UA SMALL (*)    Bacteria, UA RARE (*)    Squamous Epithelial / LPF 0-5 (*)    All other components within normal limits   URINE CULTURE  CBC WITH DIFFERENTIAL/PLATELET    EKG  EKG Interpretation  Date/Time:  Thursday December 02 2016 00:35:42 EST Ventricular Rate:  123 PR Interval:    QRS Duration: 94 QT Interval:  340 QTC Calculation: 523 R Axis:   76 Text Interpretation:  Atrial fibrillation with rapid V-rate Repolarization abnormality, prob rate related Confirmed by Quintella Reichert [phone]) on 12/02/2016 12:52:51 AM       Radiology Dg Chest 2 View  Result Date: 12/01/2016 CLINICAL DATA:  Fall in parking lot today. EXAM: CHEST  2 VIEW COMPARISON:  11/16/2013 FINDINGS: The heart size and mediastinal contours are within normal limits. Aortic atherosclerosis. Changes of COPD again noted. Both lungs are clear. No evidence of pneumothorax or pleural effusion. Several old left rib fracture deformities are again noted. Lumbar spine fusion hardware and scoliosis again noted. IMPRESSION: COPD.  No active cardiopulmonary disease. Electronically Signed   By: Earle Gell M.D.   On: 12/01/2016 20:54   Dg Forearm Left  Result Date: 12/01/2016 CLINICAL DATA:  Fall in parking lot today. Left forearm pain and bruising. Initial encounter. EXAM: LEFT FOREARM - 2 VIEW COMPARISON:  None. FINDINGS: There is no evidence of fracture or other focal bone lesions. Generalized osteopenia noted. Moderate soft tissue swelling is seen involving the mid distal forearm. IMPRESSION: Moderate soft tissue swelling. No evidence of fracture or dislocation. Electronically Signed   By: Earle Gell M.D.   On: 12/01/2016 21:00   Ct Head Wo Contrast  Result Date: 12/01/2016 CLINICAL DATA:  Initial evaluation for acute trauma, fall. EXAM: CT HEAD WITHOUT CONTRAST CT MAXILLOFACIAL WITHOUT CONTRAST CT CERVICAL SPINE WITHOUT CONTRAST TECHNIQUE: Multidetector CT imaging of the head, cervical spine, and maxillofacial structures were performed using the standard protocol without intravenous contrast. Multiplanar CT image reconstructions of the  cervical spine and maxillofacial structures were also generated. COMPARISON:  Prior CT from 01/23/2016. FINDINGS: CT HEAD FINDINGS Brain: Generalized age-related cerebral atrophy with moderate chronic small vessel ischemic disease. No acute intracranial hemorrhage. No acute large vessel territory infarct. No mass  lesion, midline shift or mass effect. No hydrocephalus. No extra-axial fluid collection. Vascular: No hyperdense vessel. Scattered vascular calcifications noted within the carotid siphons. Skull: Large soft tissue contusion present at the forehead. Calvarium intact. Other: Mastoid air cells are clear. CT MAXILLOFACIAL FINDINGS Osseous: Zygomatic arches intact. No acute maxillary fracture. Pterygoid plates intact. There are acute minimally displaced bilateral nasal bone fractures. Nasal septum mildly bowed to the right but intact. Mandible intact. Mandibular condyles normally situated. No acute abnormality about the dentition. Few scattered dental caries noted. Orbits: Globes and orbital soft tissues within normal limits. Bony orbits intact. Sinuses: Scattered mucoperiosteal thickening present within the ethmoidal air cells. Paranasal sinuses are otherwise clear. No hemosinus. Soft tissues: Large soft tissue contusion at the forehead. Mild soft tissue swelling about the nose. CT CERVICAL SPINE FINDINGS Alignment: Straightening of the normal cervical lordosis. Grade 1 anterolisthesis of C3, C4 on C5, C7 on T1, T1 on T2, and T2 on T3. Trace retrolisthesis of C5 on C6. Findings are likely chronic and degenerative. Skull base and vertebrae: Skullbase intact. Normal C1-2 articulations are preserved in the dens is intact. Vertebral body heights maintained. No acute fracture. Soft tissues and spinal canal: No acute soft tissue abnormality within the neck. No abnormal prevertebral edema. Vascular calcifications present about the carotid bifurcations. Spinal canal within normal limits. Disc levels: Advanced  degenerative spondylolysis at C5-6 and C6-7. Multilevel facet arthrosis with fusion and ankylosis, most notable within the upper cervical spine. Pronounced degenerative thickening of the tectorial membrane noted. Upper chest: Visualized upper chest within normal limits. Irregular biapical pleuroparenchymal thickening noted. Partially visualized lungs are otherwise grossly clear. Other: None. IMPRESSION: CT HEAD: 1. No acute intracranial process. 2. Large soft tissue contusion at the forehead. No calvarial fracture. 3. Age related cerebral atrophy with moderate chronic small vessel ischemic disease. CT MAXILLOFACIAL: 1. Acute minimally displaced bilateral nasal bone fractures. 2. No other acute maxillofacial injury. CT CERVICAL SPINE: 1. No acute traumatic injury within the cervical spine. 2. Advanced multilevel degenerative spondylolysis, most notable at C5-6 and C6-7. Electronically Signed   By: Jeannine Boga M.D.   On: 12/01/2016 20:47   Ct Cervical Spine Wo Contrast  Result Date: 12/01/2016 CLINICAL DATA:  Initial evaluation for acute trauma, fall. EXAM: CT HEAD WITHOUT CONTRAST CT MAXILLOFACIAL WITHOUT CONTRAST CT CERVICAL SPINE WITHOUT CONTRAST TECHNIQUE: Multidetector CT imaging of the head, cervical spine, and maxillofacial structures were performed using the standard protocol without intravenous contrast. Multiplanar CT image reconstructions of the cervical spine and maxillofacial structures were also generated. COMPARISON:  Prior CT from 01/23/2016. FINDINGS: CT HEAD FINDINGS Brain: Generalized age-related cerebral atrophy with moderate chronic small vessel ischemic disease. No acute intracranial hemorrhage. No acute large vessel territory infarct. No mass lesion, midline shift or mass effect. No hydrocephalus. No extra-axial fluid collection. Vascular: No hyperdense vessel. Scattered vascular calcifications noted within the carotid siphons. Skull: Large soft tissue contusion present at the  forehead. Calvarium intact. Other: Mastoid air cells are clear. CT MAXILLOFACIAL FINDINGS Osseous: Zygomatic arches intact. No acute maxillary fracture. Pterygoid plates intact. There are acute minimally displaced bilateral nasal bone fractures. Nasal septum mildly bowed to the right but intact. Mandible intact. Mandibular condyles normally situated. No acute abnormality about the dentition. Few scattered dental caries noted. Orbits: Globes and orbital soft tissues within normal limits. Bony orbits intact. Sinuses: Scattered mucoperiosteal thickening present within the ethmoidal air cells. Paranasal sinuses are otherwise clear. No hemosinus. Soft tissues: Large soft tissue contusion at the forehead. Mild soft  tissue swelling about the nose. CT CERVICAL SPINE FINDINGS Alignment: Straightening of the normal cervical lordosis. Grade 1 anterolisthesis of C3, C4 on C5, C7 on T1, T1 on T2, and T2 on T3. Trace retrolisthesis of C5 on C6. Findings are likely chronic and degenerative. Skull base and vertebrae: Skullbase intact. Normal C1-2 articulations are preserved in the dens is intact. Vertebral body heights maintained. No acute fracture. Soft tissues and spinal canal: No acute soft tissue abnormality within the neck. No abnormal prevertebral edema. Vascular calcifications present about the carotid bifurcations. Spinal canal within normal limits. Disc levels: Advanced degenerative spondylolysis at C5-6 and C6-7. Multilevel facet arthrosis with fusion and ankylosis, most notable within the upper cervical spine. Pronounced degenerative thickening of the tectorial membrane noted. Upper chest: Visualized upper chest within normal limits. Irregular biapical pleuroparenchymal thickening noted. Partially visualized lungs are otherwise grossly clear. Other: None. IMPRESSION: CT HEAD: 1. No acute intracranial process. 2. Large soft tissue contusion at the forehead. No calvarial fracture. 3. Age related cerebral atrophy with  moderate chronic small vessel ischemic disease. CT MAXILLOFACIAL: 1. Acute minimally displaced bilateral nasal bone fractures. 2. No other acute maxillofacial injury. CT CERVICAL SPINE: 1. No acute traumatic injury within the cervical spine. 2. Advanced multilevel degenerative spondylolysis, most notable at C5-6 and C6-7. Electronically Signed   By: Jeannine Boga M.D.   On: 12/01/2016 20:47   Dg Knee Complete 4 Views Left  Result Date: 12/01/2016 CLINICAL DATA:  Followup parking lot today. Knee pain and bruising. Initial encounter. EXAM: LEFT KNEE - COMPLETE 4+ VIEW COMPARISON:  None. FINDINGS: No evidence of fracture, dislocation, or joint effusion. No evidence of arthropathy or other focal bone abnormality. Generalized osteopenia noted. Mild peripheral vascular calcification noted. IMPRESSION: No acute findings.  Osteopenia. Electronically Signed   By: Earle Gell M.D.   On: 12/01/2016 20:59   Ct Maxillofacial Wo Cm  Result Date: 12/01/2016 CLINICAL DATA:  Initial evaluation for acute trauma, fall. EXAM: CT HEAD WITHOUT CONTRAST CT MAXILLOFACIAL WITHOUT CONTRAST CT CERVICAL SPINE WITHOUT CONTRAST TECHNIQUE: Multidetector CT imaging of the head, cervical spine, and maxillofacial structures were performed using the standard protocol without intravenous contrast. Multiplanar CT image reconstructions of the cervical spine and maxillofacial structures were also generated. COMPARISON:  Prior CT from 01/23/2016. FINDINGS: CT HEAD FINDINGS Brain: Generalized age-related cerebral atrophy with moderate chronic small vessel ischemic disease. No acute intracranial hemorrhage. No acute large vessel territory infarct. No mass lesion, midline shift or mass effect. No hydrocephalus. No extra-axial fluid collection. Vascular: No hyperdense vessel. Scattered vascular calcifications noted within the carotid siphons. Skull: Large soft tissue contusion present at the forehead. Calvarium intact. Other: Mastoid air  cells are clear. CT MAXILLOFACIAL FINDINGS Osseous: Zygomatic arches intact. No acute maxillary fracture. Pterygoid plates intact. There are acute minimally displaced bilateral nasal bone fractures. Nasal septum mildly bowed to the right but intact. Mandible intact. Mandibular condyles normally situated. No acute abnormality about the dentition. Few scattered dental caries noted. Orbits: Globes and orbital soft tissues within normal limits. Bony orbits intact. Sinuses: Scattered mucoperiosteal thickening present within the ethmoidal air cells. Paranasal sinuses are otherwise clear. No hemosinus. Soft tissues: Large soft tissue contusion at the forehead. Mild soft tissue swelling about the nose. CT CERVICAL SPINE FINDINGS Alignment: Straightening of the normal cervical lordosis. Grade 1 anterolisthesis of C3, C4 on C5, C7 on T1, T1 on T2, and T2 on T3. Trace retrolisthesis of C5 on C6. Findings are likely chronic and degenerative. Skull base and vertebrae:  Skullbase intact. Normal C1-2 articulations are preserved in the dens is intact. Vertebral body heights maintained. No acute fracture. Soft tissues and spinal canal: No acute soft tissue abnormality within the neck. No abnormal prevertebral edema. Vascular calcifications present about the carotid bifurcations. Spinal canal within normal limits. Disc levels: Advanced degenerative spondylolysis at C5-6 and C6-7. Multilevel facet arthrosis with fusion and ankylosis, most notable within the upper cervical spine. Pronounced degenerative thickening of the tectorial membrane noted. Upper chest: Visualized upper chest within normal limits. Irregular biapical pleuroparenchymal thickening noted. Partially visualized lungs are otherwise grossly clear. Other: None. IMPRESSION: CT HEAD: 1. No acute intracranial process. 2. Large soft tissue contusion at the forehead. No calvarial fracture. 3. Age related cerebral atrophy with moderate chronic small vessel ischemic disease. CT  MAXILLOFACIAL: 1. Acute minimally displaced bilateral nasal bone fractures. 2. No other acute maxillofacial injury. CT CERVICAL SPINE: 1. No acute traumatic injury within the cervical spine. 2. Advanced multilevel degenerative spondylolysis, most notable at C5-6 and C6-7. Electronically Signed   By: Jeannine Boga M.D.   On: 12/01/2016 20:47    Procedures Procedures (including critical care time)  Medications Ordered in ED Medications  lidocaine-EPINEPHrine-tetracaine (LET) solution (3 mLs Topical Given 12/01/16 1945)  lidocaine-EPINEPHrine (XYLOCAINE W/EPI) 2 %-1:200000 (PF) injection 10 mL (10 mLs Infiltration Given 12/01/16 2304)  ondansetron (ZOFRAN-ODT) disintegrating tablet 4 mg (4 mg Oral Given 12/01/16 2221)  acetaminophen (TYLENOL) tablet 650 mg (650 mg Oral Given 12/01/16 2226)  lidocaine-EPINEPHrine-tetracaine (LET) solution (3 mLs Topical Given 12/01/16 2251)  lidocaine (PF) (XYLOCAINE) 1 % injection 5 mL (5 mLs Intradermal Given 12/01/16 2340)  fentaNYL (SUBLIMAZE) injection 25 mcg (25 mcg Intravenous Given 12/02/16 0051)  sodium chloride 0.9 % bolus 250 mL (250 mLs Intravenous New Bag/Given 12/02/16 0053)     Initial Impression / Assessment and Plan / ED Course  I have reviewed the triage vital signs and the nursing notes.  Pertinent labs & imaging results that were available during my care of the patient were reviewed by me and considered in my medical decision making (see chart for details).     Patient here for evaluation of injuries following a mechanical fall.  She has multiple abrasions and contusions on examination.  Wounds repaired per PA note.  She does have significant tenderness on her knee on repeat assessment despite plain films being negative.  Will obtain CT to evaluate for occult fracture.  Patient did develop rapid atrial fibrillation while in the department, has a history of atrial fibrillation.  Given injuries related to falls and ability to ambulate  hospitalist consulted for admission for further treatment.  Final Clinical Impressions(s) / ED Diagnoses   Final diagnoses:  None    ED Discharge Orders    None       Quintella Reichert, MD 12/02/16 [phone]

## 2016-12-02 ENCOUNTER — Encounter (HOSPITAL_COMMUNITY): Payer: Self-pay

## 2016-12-02 ENCOUNTER — Observation Stay (HOSPITAL_COMMUNITY): Payer: Medicare Other

## 2016-12-02 ENCOUNTER — Other Ambulatory Visit: Payer: Self-pay

## 2016-12-02 DIAGNOSIS — R278 Other lack of coordination: Secondary | ICD-10-CM | POA: Diagnosis not present

## 2016-12-02 DIAGNOSIS — S0083XA Contusion of other part of head, initial encounter: Secondary | ICD-10-CM | POA: Diagnosis present

## 2016-12-02 DIAGNOSIS — M25562 Pain in left knee: Secondary | ICD-10-CM | POA: Diagnosis not present

## 2016-12-02 DIAGNOSIS — E861 Hypovolemia: Secondary | ICD-10-CM | POA: Diagnosis not present

## 2016-12-02 DIAGNOSIS — S0990XA Unspecified injury of head, initial encounter: Secondary | ICD-10-CM | POA: Diagnosis not present

## 2016-12-02 DIAGNOSIS — Z90710 Acquired absence of both cervix and uterus: Secondary | ICD-10-CM | POA: Diagnosis not present

## 2016-12-02 DIAGNOSIS — I5020 Unspecified systolic (congestive) heart failure: Secondary | ICD-10-CM | POA: Diagnosis not present

## 2016-12-02 DIAGNOSIS — E43 Unspecified severe protein-calorie malnutrition: Secondary | ICD-10-CM | POA: Diagnosis not present

## 2016-12-02 DIAGNOSIS — E7800 Pure hypercholesterolemia, unspecified: Secondary | ICD-10-CM | POA: Diagnosis present

## 2016-12-02 DIAGNOSIS — Z833 Family history of diabetes mellitus: Secondary | ICD-10-CM | POA: Diagnosis not present

## 2016-12-02 DIAGNOSIS — I482 Chronic atrial fibrillation: Secondary | ICD-10-CM | POA: Diagnosis present

## 2016-12-02 DIAGNOSIS — R260 Ataxic gait: Secondary | ICD-10-CM | POA: Diagnosis not present

## 2016-12-02 DIAGNOSIS — E785 Hyperlipidemia, unspecified: Secondary | ICD-10-CM | POA: Diagnosis present

## 2016-12-02 DIAGNOSIS — S3993XA Unspecified injury of pelvis, initial encounter: Secondary | ICD-10-CM | POA: Diagnosis not present

## 2016-12-02 DIAGNOSIS — Z9049 Acquired absence of other specified parts of digestive tract: Secondary | ICD-10-CM | POA: Diagnosis not present

## 2016-12-02 DIAGNOSIS — Y92007 Garden or yard of unspecified non-institutional (private) residence as the place of occurrence of the external cause: Secondary | ICD-10-CM | POA: Diagnosis not present

## 2016-12-02 DIAGNOSIS — Z7952 Long term (current) use of systemic steroids: Secondary | ICD-10-CM | POA: Diagnosis not present

## 2016-12-02 DIAGNOSIS — R1312 Dysphagia, oropharyngeal phase: Secondary | ICD-10-CM | POA: Diagnosis not present

## 2016-12-02 DIAGNOSIS — M6281 Muscle weakness (generalized): Secondary | ICD-10-CM | POA: Diagnosis not present

## 2016-12-02 DIAGNOSIS — K219 Gastro-esophageal reflux disease without esophagitis: Secondary | ICD-10-CM | POA: Diagnosis present

## 2016-12-02 DIAGNOSIS — Z803 Family history of malignant neoplasm of breast: Secondary | ICD-10-CM | POA: Diagnosis not present

## 2016-12-02 DIAGNOSIS — S022XXA Fracture of nasal bones, initial encounter for closed fracture: Secondary | ICD-10-CM | POA: Diagnosis not present

## 2016-12-02 DIAGNOSIS — Z7982 Long term (current) use of aspirin: Secondary | ICD-10-CM | POA: Diagnosis not present

## 2016-12-02 DIAGNOSIS — M545 Low back pain: Secondary | ICD-10-CM | POA: Diagnosis not present

## 2016-12-02 DIAGNOSIS — I6522 Occlusion and stenosis of left carotid artery: Secondary | ICD-10-CM | POA: Diagnosis present

## 2016-12-02 DIAGNOSIS — Z9181 History of falling: Secondary | ICD-10-CM | POA: Diagnosis not present

## 2016-12-02 DIAGNOSIS — I4891 Unspecified atrial fibrillation: Secondary | ICD-10-CM | POA: Diagnosis not present

## 2016-12-02 DIAGNOSIS — J449 Chronic obstructive pulmonary disease, unspecified: Secondary | ICD-10-CM | POA: Diagnosis not present

## 2016-12-02 DIAGNOSIS — M549 Dorsalgia, unspecified: Secondary | ICD-10-CM | POA: Diagnosis present

## 2016-12-02 DIAGNOSIS — Z981 Arthrodesis status: Secondary | ICD-10-CM | POA: Diagnosis not present

## 2016-12-02 DIAGNOSIS — I8390 Asymptomatic varicose veins of unspecified lower extremity: Secondary | ICD-10-CM | POA: Diagnosis present

## 2016-12-02 DIAGNOSIS — W19XXXA Unspecified fall, initial encounter: Secondary | ICD-10-CM | POA: Diagnosis not present

## 2016-12-02 DIAGNOSIS — I9589 Other hypotension: Secondary | ICD-10-CM | POA: Diagnosis not present

## 2016-12-02 DIAGNOSIS — S8992XA Unspecified injury of left lower leg, initial encounter: Secondary | ICD-10-CM | POA: Diagnosis not present

## 2016-12-02 DIAGNOSIS — W0110XA Fall on same level from slipping, tripping and stumbling with subsequent striking against unspecified object, initial encounter: Secondary | ICD-10-CM | POA: Diagnosis present

## 2016-12-02 DIAGNOSIS — R102 Pelvic and perineal pain: Secondary | ICD-10-CM | POA: Diagnosis not present

## 2016-12-02 DIAGNOSIS — Z8249 Family history of ischemic heart disease and other diseases of the circulatory system: Secondary | ICD-10-CM | POA: Diagnosis not present

## 2016-12-02 DIAGNOSIS — Y9301 Activity, walking, marching and hiking: Secondary | ICD-10-CM | POA: Diagnosis present

## 2016-12-02 DIAGNOSIS — S0450XA Injury of facial nerve, unspecified side, initial encounter: Secondary | ICD-10-CM | POA: Diagnosis not present

## 2016-12-02 DIAGNOSIS — G8929 Other chronic pain: Secondary | ICD-10-CM | POA: Diagnosis present

## 2016-12-02 DIAGNOSIS — E039 Hypothyroidism, unspecified: Secondary | ICD-10-CM | POA: Diagnosis not present

## 2016-12-02 DIAGNOSIS — I959 Hypotension, unspecified: Secondary | ICD-10-CM | POA: Diagnosis present

## 2016-12-02 DIAGNOSIS — R2681 Unsteadiness on feet: Secondary | ICD-10-CM | POA: Diagnosis not present

## 2016-12-02 DIAGNOSIS — E038 Other specified hypothyroidism: Secondary | ICD-10-CM | POA: Diagnosis not present

## 2016-12-02 DIAGNOSIS — S5012XA Contusion of left forearm, initial encounter: Secondary | ICD-10-CM | POA: Diagnosis present

## 2016-12-02 DIAGNOSIS — Z823 Family history of stroke: Secondary | ICD-10-CM | POA: Diagnosis not present

## 2016-12-02 LAB — CBC WITH DIFFERENTIAL/PLATELET
Basophils Absolute: 0 10*3/uL (ref 0.0–0.1)
Basophils Relative: 0 %
Eosinophils Absolute: 0 10*3/uL (ref 0.0–0.7)
Eosinophils Relative: 0 %
HCT: 35.1 % — ABNORMAL LOW (ref 36.0–46.0)
Hemoglobin: 11.5 g/dL — ABNORMAL LOW (ref 12.0–15.0)
Lymphocytes Relative: 14 %
Lymphs Abs: 1.6 10*3/uL (ref 0.7–4.0)
MCH: 32.2 pg (ref 26.0–34.0)
MCHC: 32.8 g/dL (ref 30.0–36.0)
MCV: 98.3 fL (ref 78.0–100.0)
Monocytes Absolute: 1.5 10*3/uL — ABNORMAL HIGH (ref 0.1–1.0)
Monocytes Relative: 13 %
Neutro Abs: 8 10*3/uL — ABNORMAL HIGH (ref 1.7–7.7)
Neutrophils Relative %: 73 %
Platelets: 199 10*3/uL (ref 150–400)
RBC: 3.57 MIL/uL — ABNORMAL LOW (ref 3.87–5.11)
RDW: 13.9 % (ref 11.5–15.5)
WBC: 11.1 10*3/uL — ABNORMAL HIGH (ref 4.0–10.5)

## 2016-12-02 LAB — BASIC METABOLIC PANEL
Anion gap: 7 (ref 5–15)
BUN: 15 mg/dL (ref 6–20)
CO2: 28 mmol/L (ref 22–32)
Calcium: 8.7 mg/dL — ABNORMAL LOW (ref 8.9–10.3)
Chloride: 103 mmol/L (ref 101–111)
Creatinine, Ser: 0.72 mg/dL (ref 0.44–1.00)
GFR calc Af Amer: >60 mL/min (ref >60)
GFR calc non Af Amer: >60 mL/min (ref >60)
Glucose, Bld: 110 mg/dL — ABNORMAL HIGH (ref 65–99)
Potassium: 4.2 mmol/L (ref 3.5–5.1)
Sodium: 138 mmol/L (ref 135–145)

## 2016-12-02 LAB — TROPONIN I: Troponin I: 0.04 ng/mL (ref <0.03)

## 2016-12-02 LAB — MRSA PCR SCREENING: MRSA by PCR: NEGATIVE

## 2016-12-02 LAB — MAGNESIUM: Magnesium: 1.9 mg/dL (ref 1.7–2.4)

## 2016-12-02 LAB — TSH: TSH: 1.272 u[IU]/mL (ref 0.350–4.500)

## 2016-12-02 MED ORDER — SODIUM CHLORIDE 0.9 % IV SOLN
INTRAVENOUS | Status: DC
  Administered 2016-12-02: 75 mL/h via INTRAVENOUS

## 2016-12-02 MED ORDER — ACETAMINOPHEN 650 MG RE SUPP: 650.0000 mg | Freq: Four times a day (QID) | RECTAL | Status: DC | PRN

## 2016-12-02 MED ORDER — ACETAMINOPHEN 325 MG PO TABS
650.0000 mg | ORAL_TABLET | Freq: Four times a day (QID) | ORAL | Status: DC | PRN
  Administered 2016-12-02 – 2016-12-05 (×3): 650 mg via ORAL
  Filled 2016-12-02 (×4): qty 2

## 2016-12-02 MED ORDER — SODIUM CHLORIDE 0.9 % IV BOLUS (SEPSIS)
250.0000 mL | Freq: Once | INTRAVENOUS | Status: AC
  Administered 2016-12-02: 250 mL via INTRAVENOUS

## 2016-12-02 MED ORDER — ATENOLOL 12.5 MG HALF TABLET
12.5000 mg | ORAL_TABLET | Freq: Two times a day (BID) | ORAL | Status: DC
  Administered 2016-12-02 – 2016-12-03 (×2): 12.5 mg via ORAL
  Filled 2016-12-02 (×2): qty 1

## 2016-12-02 MED ORDER — DULOXETINE HCL 20 MG PO CPEP
20.0000 mg | ORAL_CAPSULE | Freq: Every day | ORAL | Status: DC
  Administered 2016-12-02 – 2016-12-06 (×5): 20 mg via ORAL
  Filled 2016-12-02 (×6): qty 1

## 2016-12-02 MED ORDER — ATENOLOL 25 MG PO TABS
25.0000 mg | ORAL_TABLET | Freq: Two times a day (BID) | ORAL | Status: DC
  Filled 2016-12-02: qty 1

## 2016-12-02 MED ORDER — FENTANYL CITRATE (PF) 100 MCG/2ML IJ SOLN
25.0000 ug | Freq: Once | INTRAMUSCULAR | Status: AC
  Administered 2016-12-02: 25 ug via INTRAVENOUS
  Filled 2016-12-02: qty 2

## 2016-12-02 MED ORDER — ORAL CARE MOUTH RINSE
15.0000 mL | Freq: Two times a day (BID) | OROMUCOSAL | Status: DC
  Administered 2016-12-04 – 2016-12-07 (×5): 15 mL via OROMUCOSAL

## 2016-12-02 MED ORDER — TRAMADOL HCL 50 MG PO TABS: 50.0000 mg | ORAL_TABLET | Freq: Four times a day (QID) | ORAL | Status: DC | PRN

## 2016-12-02 MED ORDER — METOPROLOL TARTRATE 5 MG/5ML IV SOLN: 2.5000 mg | Freq: Once | INTRAVENOUS | Status: DC

## 2016-12-02 MED ORDER — SIMVASTATIN 20 MG PO TABS
20.0000 mg | ORAL_TABLET | Freq: Every day | ORAL | Status: DC
  Administered 2016-12-02 – 2016-12-06 (×5): 20 mg via ORAL
  Filled 2016-12-02 (×5): qty 1

## 2016-12-02 MED ORDER — CHLORHEXIDINE GLUCONATE 0.12 % MT SOLN
15.0000 mL | Freq: Two times a day (BID) | OROMUCOSAL | Status: DC
  Administered 2016-12-02 – 2016-12-06 (×9): 15 mL via OROMUCOSAL
  Filled 2016-12-02 (×8): qty 15

## 2016-12-02 MED ORDER — ONDANSETRON HCL 4 MG PO TABS: 4.0000 mg | ORAL_TABLET | Freq: Four times a day (QID) | ORAL | Status: DC | PRN

## 2016-12-02 MED ORDER — OXYCODONE HCL 5 MG PO TABS: 5.0000 mg | ORAL_TABLET | Freq: Four times a day (QID) | ORAL | Status: DC | PRN

## 2016-12-02 MED ORDER — ENSURE ENLIVE PO LIQD
237.0000 mL | Freq: Two times a day (BID) | ORAL | Status: DC
  Administered 2016-12-02 – 2016-12-03 (×3): 237 mL via ORAL

## 2016-12-02 MED ORDER — ONDANSETRON HCL 4 MG/2ML IJ SOLN: 4.0000 mg | Freq: Four times a day (QID) | INTRAMUSCULAR | Status: DC | PRN

## 2016-12-02 NOTE — Progress Notes (Signed)
CRITICAL VALUE ALERT  Critical Value:  Troponin 0.04  Date & Time Notied:  12/07/16 06:11  Provider Notified: Dr Baltazar Najjar  Orders Received/Actions taken: No additional orders at this time.

## 2016-12-02 NOTE — Progress Notes (Signed)
Text paged Dr Baltazar Najjar requesting a mech soft diet because this is the diet the patient has at home.

## 2016-12-02 NOTE — Evaluation (Addendum)
Physical Therapy Evaluation Patient Details Name: Vanessa [name] MRN: [id number] DOB: 1920/02/17 Today's Date: 12/02/2016   History of Present Illness  Pt adm after fall and found to have afib with rvr. Pt also with nasal fx and multiple skin tears and bruises on face as well as lt forearm and hand bruising. PMH - afib, peripheral neuropathy, scoliosis  Clinical Impression  Pt admitted with above diagnosis and presents to PT with functional limitations due to deficits listed below (See PT problem list). Pt needs skilled PT to maximize independence and safety to allow discharge to SNF at [facility]. Pt currently needs assist with all mobility and could benefit from stay at SNF at [facility]. Of Note: BP sitting EOB 110/63. BP after bsc and amb 12' 70/58 sitting in recliner with feet up. BP returned to 110/66 after in recliner x 3-4 additional minutes. Pt reports head not feeling right when up.     Follow Up Recommendations SNF    Equipment Recommendations  None recommended by PT    Recommendations for Other Services       Precautions / Restrictions Precautions Precautions: Fall Restrictions Weight Bearing Restrictions: No      Mobility  Bed Mobility Overal bed mobility: Needs Assistance Bed Mobility: Supine to Sit     Supine to sit: Mod assist     General bed mobility comments: Assist to elevate trunk into sitting. Initial posterior lean  Transfers Overall transfer level: Needs assistance Equipment used: 1 person hand held assist;Rolling walker (2 wheeled) Transfers: Sit to/from [facility] Sit to Stand: Min assist;Mod assist;+2 safety/equipment Stand pivot transfers: Min assist;+2 safety/equipment       General transfer comment: Assist for balance and to bring hips up. Pt with posterior lean and stated head didn't feel right. Bed to bsc with bil forearm support.   Ambulation/Gait Ambulation/Gait assistance: Min assist;+2  safety/equipment Ambulation Distance (Feet): 12 Feet Assistive device: Rolling walker (2 wheeled) Gait Pattern/deviations: Step-through pattern;Decreased step length - right;Decreased step length - left;Shuffle;Trunk flexed Gait velocity: decr Gait velocity interpretation: <1.8 ft/sec, indicative of risk for recurrent falls General Gait Details: Assist for balance and support and to guide walker  Stairs            Wheelchair Mobility    Modified Rankin (Stroke Patients Only)       Balance Overall balance assessment: Needs assistance Sitting-balance support: Bilateral upper extremity supported;Feet supported Sitting balance-Leahy Scale: Poor Sitting balance - Comments: UE support and min guard. Pt with initial posterior lean in sitting Postural control: Posterior lean Standing balance support: Bilateral upper extremity supported Standing balance-Leahy Scale: Poor Standing balance comment: walker and min assist for static standing. Initial posterior lean                             Pertinent Vitals/Pain Pain Assessment: Faces Faces Pain Scale: Hurts little more Pain Location: lt forearm and hand Pain Descriptors / Indicators: Grimacing;Guarding Pain Intervention(s): Limited activity within patient's tolerance;Monitored during session;Repositioned    Home Living Family/patient expects to be discharged to:: Assisted living               Home Equipment: Walker - 2 wheels;Cane - single point Additional Comments: Lives at [facility]    Prior Function Level of Independence: Independent with assistive device(s)         Comments: Amb with cane or walker     Hand Dominance  Dominant Hand: Right    Extremity/Trunk Assessment   Upper Extremity Assessment Upper Extremity Assessment: Generalized weakness;LUE deficits/detail LUE Deficits / Details: Limited hand and wrist due to pain    Lower Extremity Assessment Lower Extremity Assessment:  Generalized weakness       Communication   Communication: HOH  Cognition Arousal/Alertness: Awake/alert Behavior During Therapy: WFL for tasks assessed/performed Overall Cognitive Status: No family/caregiver present to determine baseline cognitive functioning                                        General Comments      Exercises     Assessment/Plan    PT Assessment Patient needs continued PT services  PT Problem List Decreased strength;Decreased activity tolerance;Decreased balance;Decreased mobility;Pain       PT Treatment Interventions DME instruction;Gait training;Functional mobility training;Therapeutic activities;Therapeutic exercise;Balance training;Patient/family education    PT Goals (Current goals can be found in the Care Plan section)  Acute Rehab PT Goals Patient Stated Goal: [facility] to [facility] PT Goal Formulation: With patient Time For Goal Achievement: 12/16/16 Potential to Achieve Goals: Good    Frequency Min 2X/week   Barriers to discharge        Co-evaluation               AM-PAC PT "6 Clicks" Daily Activity  Outcome Measure Difficulty turning over in bed (including adjusting bedclothes, sheets and blankets)?: Unable Difficulty moving from lying on back to sitting on the side of the bed? : Unable Difficulty sitting down on and standing up from a chair with arms (e.g., wheelchair, bedside commode, etc,.)?: Unable Help needed moving to and from a bed to chair (including a wheelchair)?: A Little Help needed walking in hospital room?: A Little Help needed climbing 3-5 steps with a railing? : Total 6 Click Score: 10    End of Session Equipment Utilized During Treatment: Gait belt Activity Tolerance: Patient limited by fatigue Patient left: in chair;with call bell/phone within reach;with chair alarm set Nurse Communication: Mobility status PT Visit Diagnosis: Unsteadiness on feet (R26.81);Other abnormalities of gait and  mobility (R26.89);History of falling (Z91.81)    Time: 0017-4944 PT Time Calculation (min) (ACUTE ONLY): 21 min   Charges:   PT Evaluation $PT Eval Moderate Complexity: 1 Mod     PT G Codes:   PT G-Codes **NOT FOR INPATIENT CLASS** Functional Assessment Tool Used: AM-PAC 6 Clicks Basic Mobility Functional Limitation: Mobility: Walking and moving around Mobility: Walking and Moving Around Current Status ([id number]): At least 60 percent but less than 80 percent impaired, limited or restricted Mobility: Walking and Moving Around Goal Status [phone]): At least 40 percent but less than 60 percent impaired, limited or restricted    [facility] PT [place] 12/02/2016, 1:11 PM

## 2016-12-02 NOTE — H&P (Addendum)
History and Physical    Vanessa [name] VHQ:[id number] DOB: 1920-10-19 DOA: 12/01/2016  PCP: Blanchie Serve, MD  Patient coming from: [facility].  Chief Complaint: Fall.  HPI: Vanessa [name] is a 83 y.o. female with 2 atrial fibrillation hyperlipidemia complete occlusion of carotid artery was brought to the ER after patient had a fall.  Patient states she was walking on the yard when she tripped and fell and hit her face.  Did not lose consciousness.  Denies any chest pain or shortness of breath.  Patient had multiple bruises and was brought to the ER.  ED Course: In the ER patient is found to be in A. fib with RVR.  Patient also had multiple ecchymotic areas on the face.  Sutures were placed for skin tear.  CT of the head neck maxillofacial and left knee was done.  Showed minimally displaced nasal bone fracture.  X-ray of the left forearm shows soft tissue swelling but no fractures.  Patient admitted for further observation.  May need higher level of care.  Review of Systems: As per HPI, rest all negative.   Past Medical History:  Diagnosis Date  . Abdominal bloating   . Atrial fibrillation ([place])   . Bruises easily   . Carotid artery occlusion    LEFT  . Dizziness   . DOE (dyspnea on exertion) 04/02/2014  . Fall at home Sept 2013, Dec. 2013  Jun 08, 2012  . GERD (gastroesophageal reflux disease) 02/11/2015  . Headache(784.0)   . Hoarseness   . Hypercholesterolemia   . Hyperglycemia 04/02/2014   Glucose 178 mg percent on 01/22/2014 04/05/14 Hgb A1c 6.6 Diet controlled.    . Hypothyroidism 04/15/2015  . Neuropathy    PERIPHERAL  . Pruritus   . Scoliosis   . Varicose veins     Past Surgical History:  Procedure Laterality Date  . ABDOMINAL HYSTERECTOMY  1954  . CHOLECYSTECTOMY  1997  . ESOPHAGOGASTRODUODENOSCOPY (EGD) WITH PROPOFOL N/A 11/25/2016   Procedure: ESOPHAGOGASTRODUODENOSCOPY (EGD) WITH PROPOFOL;  Surgeon: [name], MD;   Location: [facility];  Service: Endoscopy;  Laterality: N/A;  . [place]   correct scoliosis     reports that  has never smoked. she has never used smokeless tobacco. She reports that she does not drink alcohol or use drugs.  No Known Allergies  Family History  Adopted: Yes  Problem Relation Age of Onset  . Diabetes Mother   . Heart attack Mother   . Heart disease Mother        After age 68  . Heart attack Father   . Stroke Father   . Breast cancer Sister   . Heart disease Maternal Grandmother   . Cancer Son        adopted son. esophagus, stomach, liver  . Stomach cancer Neg Hx   . Colon cancer Neg Hx     Prior to Admission medications   Medication Sig Start Date End Date Taking? Authorizing Provider  acetaminophen (TYLENOL) 500 MG tablet Take 500 mg every 6 (six) hours as needed by mouth (for pain/back pain.).    Yes [provider]  aspirin EC 81 MG tablet Take 81 mg daily by mouth.   Yes [provider]  atenolol (TENORMIN) 25 MG tablet TAKE 1 TABLET TWICE DAILY. 06/30/16  Yes HiltyNadean Corwin, MD  Biotin 5000 MCG CAPS Take 5,000 mcg daily by mouth.    Yes [provider]  Calcium-Vitamin D-Vitamin K (VIACTIV) 892-119-41 MG-UNT-MCG CHEW Chew 1 tablet daily by mouth.   Yes [provider]  DULoxetine (CYMBALTA) 20 MG capsule TAKE 1 CAPSULE AT BEDTIME. 10/04/16  Yes Millikan, Megan, NP  Emollient (ALBOLENE EX) Apply 1 application at bedtime topically.   Yes [provider]  multivitamin-iron-minerals-folic acid (CENTRUM) chewable tablet Chew 1 tablet daily by mouth.   Yes [provider]  polyethylene glycol (MIRALAX / GLYCOLAX) packet Take 17 g daily as needed by mouth (for constipation).    Yes [provider]  simvastatin (ZOCOR) 40 MG tablet Take 20 mg at bedtime by mouth.    Yes [provider]  triamcinolone cream (KENALOG) 0.1 % Apply 1 application 2 (two) times daily as needed topically  (for itchy back).   Yes [provider]    Physical Exam: Vitals:   12/02/16 0145 12/02/16 0200 12/02/16 0215 12/02/16 0259  BP: 120/81 118/66 (!) 106/49 116/70  Pulse: (!) 113 83 83 91  Resp: (!) 21 16 17 19   Temp:    97.9 F (36.6 C)  TempSrc:    Oral  SpO2: 95% 94% 95% 99%  Weight:      Height:          Constitutional: Moderately built and nourished. Vitals:   12/02/16 0145 12/02/16 0200 12/02/16 0215 12/02/16 0259  BP: 120/81 118/66 (!) 106/49 116/70  Pulse: (!) 113 83 83 91  Resp: (!) 21 16 17 19   Temp:    97.9 F (36.6 C)  TempSrc:    Oral  SpO2: 95% 94% 95% 99%  Weight:      Height:       Eyes: Periorbital ecchymotic areas. ENMT: Multiple ecchymotic areas on the face. Neck: No neck rigidity no mass felt. Respiratory: No rhonchi or crepitations. Cardiovascular: S1-S2 heard. Abdomen: Soft nontender bowel sounds present. Musculoskeletal: Multiple bruises on extremities. Skin: Ecchymotic areas. Neurologic: Alert awake oriented to time place and person.  Moves all extremities. Psychiatric: Appears normal.  Normal affect.   Labs on Admission: I have personally reviewed following labs and imaging studies  CBC: Recent Labs  Lab 11/25/16 1201 12/01/16 1904  WBC  --  8.4  NEUTROABS  --  6.3  HGB 13.3 13.8  HCT 39.0 42.0  MCV  --  98.4  PLT  --  740   Basic Metabolic Panel: Recent Labs  Lab 11/25/16 1201 12/01/16 1904  NA 139 137  K 4.9 4.6  CL  --  100*  CO2  --  29  GLUCOSE 92 108*  BUN  --  17  CREATININE  --  0.77  CALCIUM  --  9.4   GFR: Estimated Creatinine Clearance: 28.3 mL/min (by C-G formula based on SCr of 0.77 mg/dL). Liver Function Tests: No results for input(s): AST, ALT, ALKPHOS, BILITOT, PROT, ALBUMIN in the last 168 hours. No results for input(s): LIPASE, AMYLASE in the last 168 hours. No results for input(s): AMMONIA in the last 168 hours. Coagulation Profile: No results for input(s): INR, PROTIME in the last 168  hours. Cardiac Enzymes: No results for input(s): CKTOTAL, CKMB, CKMBINDEX, TROPONINI in the last 168 hours. BNP (last 3 results) No results for input(s): PROBNP in the last 8760 hours. HbA1C: No results for input(s): HGBA1C in the last 72 hours. CBG: No results for input(s): GLUCAP in the last 168 hours. Lipid Profile: No results for input(s): CHOL, HDL, LDLCALC, TRIG, CHOLHDL, LDLDIRECT in the last 72 hours. Thyroid Function Tests: No results  for input(s): TSH, T4TOTAL, FREET4, T3FREE, THYROIDAB in the last 72 hours. Anemia Panel: No results for input(s): VITAMINB12, FOLATE, FERRITIN, TIBC, IRON, RETICCTPCT in the last 72 hours. Urine analysis:    Component Value Date/Time   COLORURINE YELLOW 12/01/2016 2121   APPEARANCEUR CLEAR 12/01/2016 2121   LABSPEC 1.012 12/01/2016 2121   PHURINE 7.0 12/01/2016 2121   GLUCOSEU NEGATIVE 12/01/2016 2121   HGBUR NEGATIVE 12/01/2016 2121   [place] NEGATIVE 12/01/2016 2121   BILIRUBINUR neg 04/20/2012 1631   KETONESUR NEGATIVE 12/01/2016 2121   PROTEINUR NEGATIVE 12/01/2016 2121   UROBILINOGEN 0.2 01/22/2014 1920   NITRITE NEGATIVE 12/01/2016 2121   LEUKOCYTESUR SMALL (A) 12/01/2016 2121   Sepsis Labs: @LABRCNTIP (procalcitonin:4,lacticidven:4) )No results found for this or any previous visit (from the past 240 hour(s)).   Radiological Exams on Admission: Dg Chest 2 View  Result Date: 12/01/2016 CLINICAL DATA:  Fall in parking lot today. EXAM: CHEST  2 VIEW COMPARISON:  11/16/2013 FINDINGS: The heart size and mediastinal contours are within normal limits. Aortic atherosclerosis. Changes of COPD again noted. Both lungs are clear. No evidence of pneumothorax or pleural effusion. Several old left rib fracture deformities are again noted. Lumbar spine fusion hardware and scoliosis again noted. IMPRESSION: COPD.  No active cardiopulmonary disease. Electronically Signed   By: Earle Gell M.D.   On: 12/01/2016 20:54   Dg Forearm Left  Result  Date: 12/01/2016 CLINICAL DATA:  Fall in parking lot today. Left forearm pain and bruising. Initial encounter. EXAM: LEFT FOREARM - 2 VIEW COMPARISON:  None. FINDINGS: There is no evidence of fracture or other focal bone lesions. Generalized osteopenia noted. Moderate soft tissue swelling is seen involving the mid distal forearm. IMPRESSION: Moderate soft tissue swelling. No evidence of fracture or dislocation. Electronically Signed   By: Earle Gell M.D.   On: 12/01/2016 21:00   Ct Head Wo Contrast  Result Date: 12/01/2016 CLINICAL DATA:  Initial evaluation for acute trauma, fall. EXAM: CT HEAD WITHOUT CONTRAST CT MAXILLOFACIAL WITHOUT CONTRAST CT CERVICAL SPINE WITHOUT CONTRAST TECHNIQUE: Multidetector CT imaging of the head, cervical spine, and maxillofacial structures were performed using the standard protocol without intravenous contrast. Multiplanar CT image reconstructions of the cervical spine and maxillofacial structures were also generated. COMPARISON:  Prior CT from 01/23/2016. FINDINGS: CT HEAD FINDINGS Brain: Generalized age-related cerebral atrophy with moderate chronic small vessel ischemic disease. No acute intracranial hemorrhage. No acute large vessel territory infarct. No mass lesion, midline shift or mass effect. No hydrocephalus. No extra-axial fluid collection. Vascular: No hyperdense vessel. Scattered vascular calcifications noted within the carotid siphons. Skull: Large soft tissue contusion present at the forehead. Calvarium intact. Other: Mastoid air cells are clear. CT MAXILLOFACIAL FINDINGS Osseous: Zygomatic arches intact. No acute maxillary fracture. Pterygoid plates intact. There are acute minimally displaced bilateral nasal bone fractures. Nasal septum mildly bowed to the right but intact. Mandible intact. Mandibular condyles normally situated. No acute abnormality about the dentition. Few scattered dental caries noted. Orbits: Globes and orbital soft tissues within normal  limits. Bony orbits intact. Sinuses: Scattered mucoperiosteal thickening present within the ethmoidal air cells. Paranasal sinuses are otherwise clear. No hemosinus. Soft tissues: Large soft tissue contusion at the forehead. Mild soft tissue swelling about the nose. CT CERVICAL SPINE FINDINGS Alignment: Straightening of the normal cervical lordosis. Grade 1 anterolisthesis of C3, C4 on C5, C7 on T1, T1 on T2, and T2 on T3. Trace retrolisthesis of C5 on C6. Findings are likely chronic and degenerative. Skull base and vertebrae:  Skullbase intact. Normal C1-2 articulations are preserved in the dens is intact. Vertebral body heights maintained. No acute fracture. Soft tissues and spinal canal: No acute soft tissue abnormality within the neck. No abnormal prevertebral edema. Vascular calcifications present about the carotid bifurcations. Spinal canal within normal limits. Disc levels: Advanced degenerative spondylolysis at C5-6 and C6-7. Multilevel facet arthrosis with fusion and ankylosis, most notable within the upper cervical spine. Pronounced degenerative thickening of the tectorial membrane noted. Upper chest: Visualized upper chest within normal limits. Irregular biapical pleuroparenchymal thickening noted. Partially visualized lungs are otherwise grossly clear. Other: None. IMPRESSION: CT HEAD: 1. No acute intracranial process. 2. Large soft tissue contusion at the forehead. No calvarial fracture. 3. Age related cerebral atrophy with moderate chronic small vessel ischemic disease. CT MAXILLOFACIAL: 1. Acute minimally displaced bilateral nasal bone fractures. 2. No other acute maxillofacial injury. CT CERVICAL SPINE: 1. No acute traumatic injury within the cervical spine. 2. Advanced multilevel degenerative spondylolysis, most notable at C5-6 and C6-7. Electronically Signed   By: Jeannine Boga M.D.   On: 12/01/2016 20:47   Ct Cervical Spine Wo Contrast  Result Date: 12/01/2016 CLINICAL DATA:  Initial  evaluation for acute trauma, fall. EXAM: CT HEAD WITHOUT CONTRAST CT MAXILLOFACIAL WITHOUT CONTRAST CT CERVICAL SPINE WITHOUT CONTRAST TECHNIQUE: Multidetector CT imaging of the head, cervical spine, and maxillofacial structures were performed using the standard protocol without intravenous contrast. Multiplanar CT image reconstructions of the cervical spine and maxillofacial structures were also generated. COMPARISON:  Prior CT from 01/23/2016. FINDINGS: CT HEAD FINDINGS Brain: Generalized age-related cerebral atrophy with moderate chronic small vessel ischemic disease. No acute intracranial hemorrhage. No acute large vessel territory infarct. No mass lesion, midline shift or mass effect. No hydrocephalus. No extra-axial fluid collection. Vascular: No hyperdense vessel. Scattered vascular calcifications noted within the carotid siphons. Skull: Large soft tissue contusion present at the forehead. Calvarium intact. Other: Mastoid air cells are clear. CT MAXILLOFACIAL FINDINGS Osseous: Zygomatic arches intact. No acute maxillary fracture. Pterygoid plates intact. There are acute minimally displaced bilateral nasal bone fractures. Nasal septum mildly bowed to the right but intact. Mandible intact. Mandibular condyles normally situated. No acute abnormality about the dentition. Few scattered dental caries noted. Orbits: Globes and orbital soft tissues within normal limits. Bony orbits intact. Sinuses: Scattered mucoperiosteal thickening present within the ethmoidal air cells. Paranasal sinuses are otherwise clear. No hemosinus. Soft tissues: Large soft tissue contusion at the forehead. Mild soft tissue swelling about the nose. CT CERVICAL SPINE FINDINGS Alignment: Straightening of the normal cervical lordosis. Grade 1 anterolisthesis of C3, C4 on C5, C7 on T1, T1 on T2, and T2 on T3. Trace retrolisthesis of C5 on C6. Findings are likely chronic and degenerative. Skull base and vertebrae: Skullbase intact. Normal C1-2  articulations are preserved in the dens is intact. Vertebral body heights maintained. No acute fracture. Soft tissues and spinal canal: No acute soft tissue abnormality within the neck. No abnormal prevertebral edema. Vascular calcifications present about the carotid bifurcations. Spinal canal within normal limits. Disc levels: Advanced degenerative spondylolysis at C5-6 and C6-7. Multilevel facet arthrosis with fusion and ankylosis, most notable within the upper cervical spine. Pronounced degenerative thickening of the tectorial membrane noted. Upper chest: Visualized upper chest within normal limits. Irregular biapical pleuroparenchymal thickening noted. Partially visualized lungs are otherwise grossly clear. Other: None. IMPRESSION: CT HEAD: 1. No acute intracranial process. 2. Large soft tissue contusion at the forehead. No calvarial fracture. 3. Age related cerebral atrophy with moderate chronic small vessel  ischemic disease. CT MAXILLOFACIAL: 1. Acute minimally displaced bilateral nasal bone fractures. 2. No other acute maxillofacial injury. CT CERVICAL SPINE: 1. No acute traumatic injury within the cervical spine. 2. Advanced multilevel degenerative spondylolysis, most notable at C5-6 and C6-7. Electronically Signed   By: Jeannine Boga M.D.   On: 12/01/2016 20:47   Ct Knee Left Wo Contrast  Result Date: 12/02/2016 CLINICAL DATA:  Left knee pain after fall onto concrete wall walking. Knee trauma, tenderness or a fusion or can walk, initial exam. EXAM: CT OF THE LEFT KNEE WITHOUT CONTRAST TECHNIQUE: Multidetector CT imaging of the LEFT knee was performed according to the standard protocol. Multiplanar CT image reconstructions were also generated. COMPARISON:  Radiographs yesterday FINDINGS: Bones/Joint/Cartilage No fractures. The bones are osteoporotic. Age related tibial femoral joint space narrowing. No joint effusion. Ligaments Suboptimally assessed by CT. Muscles and Tendons No large  intramuscular hematoma. Quadriceps and patellar tendons are intact. Soft tissues Soft tissue edema medially. Vascular calcifications incidentally noted. IMPRESSION: No acute fracture, no joint effusion. Bony under mineralization. Electronically Signed   By: Jeb Levering M.D.   On: 12/02/2016 02:09   Dg Knee Complete 4 Views Left  Result Date: 12/01/2016 CLINICAL DATA:  Followup parking lot today. Knee pain and bruising. Initial encounter. EXAM: LEFT KNEE - COMPLETE 4+ VIEW COMPARISON:  None. FINDINGS: No evidence of fracture, dislocation, or joint effusion. No evidence of arthropathy or other focal bone abnormality. Generalized osteopenia noted. Mild peripheral vascular calcification noted. IMPRESSION: No acute findings.  Osteopenia. Electronically Signed   By: Earle Gell M.D.   On: 12/01/2016 20:59   Ct Maxillofacial Wo Cm  Result Date: 12/01/2016 CLINICAL DATA:  Initial evaluation for acute trauma, fall. EXAM: CT HEAD WITHOUT CONTRAST CT MAXILLOFACIAL WITHOUT CONTRAST CT CERVICAL SPINE WITHOUT CONTRAST TECHNIQUE: Multidetector CT imaging of the head, cervical spine, and maxillofacial structures were performed using the standard protocol without intravenous contrast. Multiplanar CT image reconstructions of the cervical spine and maxillofacial structures were also generated. COMPARISON:  Prior CT from 01/23/2016. FINDINGS: CT HEAD FINDINGS Brain: Generalized age-related cerebral atrophy with moderate chronic small vessel ischemic disease. No acute intracranial hemorrhage. No acute large vessel territory infarct. No mass lesion, midline shift or mass effect. No hydrocephalus. No extra-axial fluid collection. Vascular: No hyperdense vessel. Scattered vascular calcifications noted within the carotid siphons. Skull: Large soft tissue contusion present at the forehead. Calvarium intact. Other: Mastoid air cells are clear. CT MAXILLOFACIAL FINDINGS Osseous: Zygomatic arches intact. No acute maxillary  fracture. Pterygoid plates intact. There are acute minimally displaced bilateral nasal bone fractures. Nasal septum mildly bowed to the right but intact. Mandible intact. Mandibular condyles normally situated. No acute abnormality about the dentition. Few scattered dental caries noted. Orbits: Globes and orbital soft tissues within normal limits. Bony orbits intact. Sinuses: Scattered mucoperiosteal thickening present within the ethmoidal air cells. Paranasal sinuses are otherwise clear. No hemosinus. Soft tissues: Large soft tissue contusion at the forehead. Mild soft tissue swelling about the nose. CT CERVICAL SPINE FINDINGS Alignment: Straightening of the normal cervical lordosis. Grade 1 anterolisthesis of C3, C4 on C5, C7 on T1, T1 on T2, and T2 on T3. Trace retrolisthesis of C5 on C6. Findings are likely chronic and degenerative. Skull base and vertebrae: Skullbase intact. Normal C1-2 articulations are preserved in the dens is intact. Vertebral body heights maintained. No acute fracture. Soft tissues and spinal canal: No acute soft tissue abnormality within the neck. No abnormal prevertebral edema. Vascular calcifications present about the carotid  bifurcations. Spinal canal within normal limits. Disc levels: Advanced degenerative spondylolysis at C5-6 and C6-7. Multilevel facet arthrosis with fusion and ankylosis, most notable within the upper cervical spine. Pronounced degenerative thickening of the tectorial membrane noted. Upper chest: Visualized upper chest within normal limits. Irregular biapical pleuroparenchymal thickening noted. Partially visualized lungs are otherwise grossly clear. Other: None. IMPRESSION: CT HEAD: 1. No acute intracranial process. 2. Large soft tissue contusion at the forehead. No calvarial fracture. 3. Age related cerebral atrophy with moderate chronic small vessel ischemic disease. CT MAXILLOFACIAL: 1. Acute minimally displaced bilateral nasal bone fractures. 2. No other acute  maxillofacial injury. CT CERVICAL SPINE: 1. No acute traumatic injury within the cervical spine. 2. Advanced multilevel degenerative spondylolysis, most notable at C5-6 and C6-7. Electronically Signed   By: Jeannine Boga M.D.   On: 12/01/2016 20:47    EKG: Independently reviewed.  A. fib with RVR.  Assessment/Plan Principal Problem:   Atrial fibrillation with RVR (HCC) Active Problems:   Fall   Hypothyroidism    1. A. fib with RVR -at the time of my exam patient's heart rate improved without intervention.  Patient is on metoprolol which will be continued.  Chads 2 vasc score is at least 2 but patient is not on anticoagulation secondary to risk of fall and history of GI bleed.  Check TSH. 2. Fall with multiple ecchymotic areas and minimally displaced nasal fracture.-we will consult physical therapy.  Patient may need skilled nursing facility.  May consult trauma surgery in a.m. 3. Hyperlipidemia on statins.  X-ray pelvis pending.    DVT prophylaxis: SCDs for now until no obvious bleeding. Code Status: Full code as discussed with patient. Family Communication: Discussed with patient. Disposition Plan: May need rehab. Consults called: Physical therapy. Admission status: Observation.   Rise Patience MD [facility] Pager [phone].  If 7PM-7AM, please contact night-coverage [email or web address] Password [facility]  12/02/2016, 4:24 AM

## 2016-12-02 NOTE — Progress Notes (Signed)
[place] TEAM 1 - Stepdown/ICU TEAM  VELDA [name]  WER:[id number] DOB: April 13, 1920 DOA: 12/01/2016 PCP: Blanchie Serve, MD    Brief Narrative:  83 y.o. female with a hx of atrial fibrillation, hyperlipidemia, and complete occlusion of L carotid artery who was brought to the ER after a fall.  Patient was walking in the yard when she tripped and fell and hit her face.  Did not lose consciousness.  In the ER patient was found to be in A. fib with RVR.  Patient also had multiple ecchymotic areas on the face.  Sutures were placed for a skin tear.  CT of the head neck maxillofacial and left knee showed only a minimally displaced nasal bone fracture.  X-ray of the left forearm showed no fracture.   Significant Events: 11/22 admit after fall at ALF  Subjective: Pt seen for a f/u visit.    Assessment & Plan:  Chronic Afib w/ acute RVR CHA2DS2/VASc is 6 - no anticoag due to high fall risk and hx of GIB - rate now controlled   Hypotension  BP quite low considering advanced age - gently hydrate    S/p fall w/ injury - minimally displaced nasal bone fx - PT/OT to evaluate  Hypothyroidism TSH at goal at 1.27  HLD Cont statin   DVT prophylaxis: SCDs Code Status: FULL CODE Family Communication: no family present at time of exam  Disposition Plan:   Consultants:  none  Antimicrobials:  none   Objective: Blood pressure (!) 99/43, pulse (!) 103, temperature 98.2 F (36.8 C), temperature source Oral, resp. rate 15, height 5' (1.524 m), weight 41.7 kg (92 lb), SpO2 98 %.  Intake/Output Summary (Last 24 hours) at 12/02/2016 1206 Last data filed at 12/02/2016 1200 Gross per 24 hour  Intake 490 ml  Output -  Net 490 ml   Filed Weights   12/01/16 1859  Weight: 41.7 kg (92 lb)    Examination: Pt seen for a f/u visit.  CBC: Recent Labs  Lab 12/01/16 1904 12/02/16 0511  WBC 8.4 11.1*  NEUTROABS 6.3 8.0*  HGB 13.8 11.5*  HCT 42.0 35.1*  MCV 98.4 98.3  PLT 221 195    Basic Metabolic Panel: Recent Labs  Lab 12/01/16 1904 12/02/16 0511  NA 137 138  K 4.6 4.2  CL 100* 103  CO2 29 28  GLUCOSE 108* 110*  BUN 17 15  CREATININE 0.77 0.72  CALCIUM 9.4 8.7*  MG  --  1.9   GFR: Estimated Creatinine Clearance: 28.3 mL/min (by C-G formula based on SCr of 0.72 mg/dL).  Liver Function Tests: No results for input(s): AST, ALT, ALKPHOS, BILITOT, PROT, ALBUMIN in the last 168 hours. No results for input(s): LIPASE, AMYLASE in the last 168 hours. No results for input(s): AMMONIA in the last 168 hours.  Cardiac Enzymes: Recent Labs  Lab 12/02/16 0511  TROPONINI 0.04*    HbA1C: Hemoglobin A1C  Date/Time Value Ref Range Status  07/07/2015 6.4  Final   Hgb A1c MFr Bld  Date/Time Value Ref Range Status  04/05/2014 6.6 (A) 4.0 - 6.0 % Final     Recent Results (from the past 240 hour(s))  MRSA PCR Screening     Status: None   Collection Time: 12/02/16  3:15 AM  Result Value Ref Range Status   MRSA by PCR NEGATIVE NEGATIVE Final    Comment:        The GeneXpert MRSA Assay (FDA approved for NASAL specimens only), is one component  of a comprehensive MRSA colonization surveillance program. It is not intended to diagnose MRSA infection nor to guide or monitor treatment for MRSA infections.      Scheduled Meds: . atenolol  25 mg Oral BID  . chlorhexidine  15 mL Mouth Rinse BID  . DULoxetine  20 mg Oral QHS  . feeding supplement (ENSURE ENLIVE)  237 mL Oral BID BM  . mouth rinse  15 mL Mouth Rinse q12n4p  . simvastatin  20 mg Oral QHS     LOS: 0 days   Cherene Altes, MD [facility] Office  [phone] Pager - Text Page per Shea Evans as per below:  On-Call/Text Page:      [email or web address]      password [email or web address]  If 7PM-7AM, please contact night-coverage [email or web address] Password [facility] 12/02/2016, 12:06 PM

## 2016-12-02 NOTE — ED Notes (Signed)
Patient transported to CT 

## 2016-12-02 NOTE — ED Provider Notes (Signed)
..  Laceration Repair Date/Time: 12/02/2016 12:15 AM Performed by: Margarita Mail, PA-C Authorized by: Margarita Mail, PA-C   Consent:    Consent obtained:  Verbal   Consent given by:  Patient   Risks discussed:  Poor cosmetic result and poor wound healing Anesthesia (see MAR for exact dosages):    Anesthesia method:  Topical application and local infiltration   Topical anesthetic:  LET   Local anesthetic:  Lidocaine 2% WITH epi Laceration details:    Location:  Face   Face location:  Forehead   Length (cm):  5   Depth (mm):  5 Repair type:    Repair type:  Simple Pre-procedure details:    Preparation:  Patient was prepped and draped in usual sterile fashion Exploration:    Hemostasis achieved with:  Direct pressure   Contaminated: no   Skin repair:    Repair method:  Sutures   Suture size:  5-0   Wound skin closure material used: vicryl rapide.   Suture technique:  Horizontal mattress   Number of sutures:  2 Approximation:    Approximation:  Close   Vermilion border: well-aligned   .Marland KitchenLaceration Repair Date/Time: 12/02/2016 12:30 AM Performed by: Margarita Mail, PA-C Authorized by: Margarita Mail, PA-C   Consent:    Consent obtained:  Verbal   Consent given by:  Patient   Risks discussed:  Poor cosmetic result, poor wound healing, infection and retained foreign body   Alternatives discussed:  No treatment and delayed treatment Anesthesia (see MAR for exact dosages):    Anesthesia method:  Topical application and local infiltration   Topical anesthetic:  LET   Local anesthetic:  Lidocaine 1% w/o epi Laceration details:    Location:  Face   Face location:  Nose   Length (cm):  1 (stellate and ragged)   Depth (mm):  3 Exploration:    Wound exploration: wound explored through full range of motion     Contaminated: no   Skin repair:    Repair method:  Sutures   Suture size:  5-0   Suture material:  Chromic gut   Suture technique:  Simple interrupted   Number  of sutures:  4 Approximation:    Approximation:  Loose Post-procedure details:    Patient tolerance of procedure:  Tolerated well, no immediate complications      Margarita Mail, PA-C 12/02/16 0033    Quintella Reichert, MD 12/02/16 1451

## 2016-12-03 DIAGNOSIS — E039 Hypothyroidism, unspecified: Secondary | ICD-10-CM

## 2016-12-03 DIAGNOSIS — E861 Hypovolemia: Secondary | ICD-10-CM

## 2016-12-03 DIAGNOSIS — I9589 Other hypotension: Secondary | ICD-10-CM

## 2016-12-03 LAB — URINE CULTURE

## 2016-12-03 MED ORDER — ATENOLOL 25 MG PO TABS
25.0000 mg | ORAL_TABLET | Freq: Two times a day (BID) | ORAL | Status: DC
  Administered 2016-12-03 – 2016-12-05 (×4): 25 mg via ORAL
  Filled 2016-12-03 (×5): qty 1

## 2016-12-03 MED ORDER — SODIUM CHLORIDE 0.9 % IV SOLN
INTRAVENOUS | Status: DC
  Administered 2016-12-03: 13:00:00 via INTRAVENOUS

## 2016-12-03 MED ORDER — SODIUM CHLORIDE 0.9 % IV BOLUS (SEPSIS)
250.0000 mL | Freq: Once | INTRAVENOUS | Status: AC
  Administered 2016-12-03: 250 mL via INTRAVENOUS

## 2016-12-03 MED ORDER — ENSURE ENLIVE PO LIQD
237.0000 mL | Freq: Three times a day (TID) | ORAL | Status: DC
  Administered 2016-12-04 – 2016-12-07 (×10): 237 mL via ORAL

## 2016-12-03 NOTE — Progress Notes (Signed)
Initial Nutrition Assessment  DOCUMENTATION CODES:   Non-severe (moderate) malnutrition in context of chronic illness, Underweight  INTERVENTION:   -Increase Ensure Enlive po to TID, each supplement provides 350 kcal and 20 grams of protein  NUTRITION DIAGNOSIS:   Moderate Malnutrition related to chronic illness(esophageal stricture) as evidenced by energy intake < 75% for > or equal to 1 month, mild fat depletion, moderate fat depletion, mild muscle depletion, moderate muscle depletion.  GOAL:   Patient will meet greater than or equal to 90% of their needs  MONITOR:   PO intake, Supplement acceptance, Labs, Weight trends, Skin, I & O's  REASON FOR ASSESSMENT:   Malnutrition Screening Tool    ASSESSMENT:   Vanessa [name] is a 83 y.o. female with 2 atrial fibrillation hyperlipidemia complete occlusion of carotid artery was brought to the ER after patient had a fall.  Pt admitted with a-fib with RVR.   Spoke with pt and son at bedside. Both report that pt has been with poor appetite over the past few months. Pt consumes meals in the dining room at her ALF [facility]); she is able to choose menu items, but often choose staples of mashed potatoes, pudding, cauliflower, and fish. She reports that she was recommended to be on a pureed diet due to esophageal stricture, however, has been hesitant to fully transition to this. Son at bedside confirms that she declined esophageal dilation procedure, to where she would have to travel to [facility] to complete. Pt with limited oral intake here; consuming 0-40% meals. She also enjoys Ensure supplements (consumes one daily PTA).   Pt reports gradual wt loss over the past year or so. Pt has experienced a 8% wt loss over the past year, which is not significant for time frame, however, concerning coupled with poor oral intake and underweight status.   Discussed importance of good meal and supplement intake to support healing. Pt  amenable to continue Ensure supplements.  Labs reviewed.   NUTRITION - FOCUSED PHYSICAL EXAM:    Most Recent Value  Orbital Region  Moderate depletion  Upper Arm Region  Moderate depletion  Thoracic and Lumbar Region  Mild depletion  Buccal Region  Mild depletion  Temple Region  Mild depletion  Clavicle Bone Region  Moderate depletion  Clavicle and Acromion Bone Region  Moderate depletion  Scapular Bone Region  Moderate depletion  Dorsal Hand  Moderate depletion  Patellar Region  Mild depletion  Anterior Thigh Region  Mild depletion  Posterior Calf Region  Mild depletion  Edema (RD Assessment)  None  Hair  Reviewed  Eyes  Reviewed  Mouth  Reviewed  Skin  Reviewed  Nails  Reviewed       Diet Order:  DIET - DYS 1 Room service appropriate? Yes; Fluid consistency: Thin  EDUCATION NEEDS:   Education needs have been addressed  Skin:  Skin Assessment: Skin Integrity Issues: Skin Integrity Issues:: Other (Comment) Other: facial lacerations  Last BM:  12/01/16  Height:   Ht Readings from Last 1 Encounters:  12/01/16 5' (1.524 m)    Weight:   Wt Readings from Last 1 Encounters:  12/03/16 92 lb 1.6 oz (41.8 kg)    Ideal Body Weight:  45.5 kg  BMI:  Body mass index is 17.99 kg/m.  Estimated Nutritional Needs:   Kcal:  1100-1300  Protein:  50-65 grams  Fluid:  > 1.1 L    Jenice Leiner A. Jimmye Norman, RD, LDN, CDE Pager: [phone] After hours Pager: [phone]

## 2016-12-03 NOTE — Progress Notes (Signed)
[place] TEAM 1 - Stepdown/ICU TEAM  Vanessa [name]  WCB:[id number] DOB: Aug 13, 1920 DOA: 12/01/2016 PCP: Blanchie Serve, MD    Brief Narrative:  83 y.o. female with a hx of atrial fibrillation, hyperlipidemia, and complete occlusion of L carotid artery who was brought to the ER after a fall.  Patient was walking in the yard when she tripped and fell and hit her face.  Did not lose consciousness.  In the ER patient was found to be in A. fib with RVR.  Patient also had multiple ecchymotic areas on the face.  Sutures were placed for a skin tear.  CT of the head neck maxillofacial and left knee showed only a minimally displaced nasal bone fracture.  X-ray of the left forearm showed no fracture.   Significant Events: 11/22 admit after fall at ALF  Subjective: Alert and pleasant.  Anxious to be d/c to ALF, and tells me she is not interested in a SNF stay, even if just for temp rehab.  Denies cp, n/v, or abdom pain.  Persisting uncontrolled RVR has been an issue since I saw her yesterday.    Assessment & Plan:  Chronic Afib w/ acute RVR CHA2DS2/VASc is 6 - no anticoag due to high fall risk and hx of GIB - rate not controlled - likely due to some volume depletion, as well as decreased dose of BB necessitated by hypotension - adjust tx - hydrate - follow on tele   Hypotension  BP waxing and waning - suspect she remains a bit volume depleted - gently hydrate and follow    S/p fall w/ injury - minimally displaced nasal bone fx PT/OT suggest SNF - pt refusing at this time   Hypothyroidism TSH at goal at 1.27  HLD Cont statin   DVT prophylaxis: SCDs Code Status: FULL CODE Family Communication: no family present at time of exam  Disposition Plan: needs SNF - is refusing - RVR and hypotension prohibit d/c at this time   Consultants:  none  Antimicrobials:  none   Objective: Blood pressure 118/66, pulse (!) 103, temperature 98.6 F (37 C), temperature source Oral, resp. rate 17,  height 5' (1.524 m), weight 41.8 kg (92 lb 1.6 oz), SpO2 98 %.  Intake/Output Summary (Last 24 hours) at 12/03/2016 1012 Last data filed at 12/02/2016 2100 Gross per 24 hour  Intake 1175 ml  Output 500 ml  Net 675 ml   Filed Weights   12/01/16 1859 12/03/16 0127  Weight: 41.7 kg (92 lb) 41.8 kg (92 lb 1.6 oz)    Examination: General: No acute respiratory distress Lungs: CTA th/o - no wheezing Cardiovascular: irreg irreg - rapid rate - no M or rub  Abdomen: Nontender, nondistended, soft, bowel sounds positive, no rebound, no ascites, no appreciable mass Extremities: No C/C/E B LE    CBC: Recent Labs  Lab 12/01/16 1904 12/02/16 0511  WBC 8.4 11.1*  NEUTROABS 6.3 8.0*  HGB 13.8 11.5*  HCT 42.0 35.1*  MCV 98.4 98.3  PLT 221 616   Basic Metabolic Panel: Recent Labs  Lab 12/01/16 1904 12/02/16 0511  NA 137 138  K 4.6 4.2  CL 100* 103  CO2 29 28  GLUCOSE 108* 110*  BUN 17 15  CREATININE 0.77 0.72  CALCIUM 9.4 8.7*  MG  --  1.9   GFR: Estimated Creatinine Clearance: 28.4 mL/min (by C-G formula based on SCr of 0.72 mg/dL).  Cardiac Enzymes: Recent Labs  Lab 12/02/16 0511  TROPONINI 0.04*  HbA1C: Hemoglobin A1C  Date/Time Value Ref Range Status  07/07/2015 6.4  Final   Hgb A1c MFr Bld  Date/Time Value Ref Range Status  04/05/2014 6.6 (A) 4.0 - 6.0 % Final     Recent Results (from the past 240 hour(s))  Urine culture     Status: Abnormal   Collection Time: 12/01/16  9:14 PM  Result Value Ref Range Status   Specimen Description URINE, RANDOM  Final   Special Requests NONE  Final   Culture MULTIPLE SPECIES PRESENT, SUGGEST RECOLLECTION (A)  Final   Report Status 12/03/2016 FINAL  Final  MRSA PCR Screening     Status: None   Collection Time: 12/02/16  3:15 AM  Result Value Ref Range Status   MRSA by PCR NEGATIVE NEGATIVE Final    Comment:        The GeneXpert MRSA Assay (FDA approved for NASAL specimens only), is one component of  a comprehensive MRSA colonization surveillance program. It is not intended to diagnose MRSA infection nor to guide or monitor treatment for MRSA infections.      Scheduled Meds: . atenolol  12.5 mg Oral BID  . chlorhexidine  15 mL Mouth Rinse BID  . DULoxetine  20 mg Oral QHS  . feeding supplement (ENSURE ENLIVE)  237 mL Oral BID BM  . mouth rinse  15 mL Mouth Rinse q12n4p  . simvastatin  20 mg Oral QHS     LOS: 1 day   Cherene Altes, MD [facility] Office  [phone] Pager - Text Page per Shea Evans as per below:  On-Call/Text Page:      [email or web address]      password [email or web address]  If 7PM-7AM, please contact night-coverage [email or web address] Password [email or web address] 12/03/2016, 10:12 AM

## 2016-12-03 NOTE — Evaluation (Signed)
Occupational Therapy Evaluation Patient Details Name: Vanessa [name] MRN: [id number] DOB: 05/14/1920 Today's Date: 12/03/2016    History of Present Illness Pt adm after fall and found to have afib with rvr. Pt also with nasal fx and multiple skin tears and bruises on face as well as lt forearm and hand bruising. PMH - afib, peripheral neuropathy, scoliosis, sciatica (getting shots), and vertigo   Clinical Impression   PTA Pt independent in ADL and mobility with RW at home and [facility] for driving/community mobility. Pt is currently min A overall for ADL and mod A with HHA for short transfer (will need +2 for safety/lines for further - bathroom ambulation). Pt is very pleasant [facility]. Please see OT problem list for full list of functional deficits. Pt will benefit from skilled OT in the acute setting and afterwards at the SNF level to return to PLOF.     Follow Up Recommendations  SNF;Supervision/Assistance - 24 hour    Equipment Recommendations  None recommended by OT(defer to next venue)    Recommendations for Other Services       Precautions / Restrictions Precautions Precautions: Fall Restrictions Weight Bearing Restrictions: No      Mobility Bed Mobility               General bed mobility comments: Pt sitting OOB in recliner when OT entered  Transfers Overall transfer level: Needs assistance Equipment used: 1 person hand held assist Transfers: Sit to/from Stand Sit to Stand: Mod assist         General transfer comment: Assist for balance and to bring hips up. Pt stated head didn't feel right with positional changes    Balance Overall balance assessment: Needs assistance Sitting-balance support: Bilateral upper extremity supported;Feet supported Sitting balance-Leahy Scale: Poor Sitting balance - Comments: UE support and min guard. Pt with initial posterior lean in sitting Postural control: Posterior  lean Standing balance support: Single extremity supported Standing balance-Leahy Scale: Poor Standing balance comment: min to mod a for standing balance                           ADL either performed or assessed with clinical judgement   ADL Overall ADL's : Needs assistance/impaired Eating/Feeding: Modified independent;Sitting   Grooming: Wash/dry hands;Set up;Sitting Grooming Details (indicate cue type and reason): in recliner Upper Body Bathing: Minimal assistance   Lower Body Bathing: Minimal assistance;Sitting/lateral leans   Upper Body Dressing : Minimal assistance   Lower Body Dressing: Moderate assistance;Sit to/from stand   Toilet Transfer: Moderate assistance;Stand-pivot;BSC   Toileting- Clothing Manipulation and Hygiene: Moderate assistance;Sit to/from stand   Tub/ Banker: Moderate assistance   Functional mobility during ADLs: Moderate assistance;+2 for safety/equipment       Vision Baseline Vision/History: Wears glasses Wears Glasses: At all times Patient Visual Report: No change from baseline;Other (comment)(said that even PTA she was seeing Manufacturing systems engineer") Vision Assessment?: Yes Eye Alignment: Within Functional Limits Ocular Range of Motion: Within Functional Limits Alignment/Gaze Preference: Within Defined Limits Tracking/Visual Pursuits: Able to track stimulus in all quads without difficulty Saccades: Within functional limits     Perception     Praxis      Pertinent Vitals/Pain Pain Assessment: 0-10 Pain Score: 2  Pain Location: lt forearm and hand, nose Pain Descriptors / Indicators: Grimacing;Guarding;Discomfort;Sore Pain Intervention(s): Monitored during session;Repositioned     Hand Dominance Right   Extremity/Trunk Assessment Upper Extremity  Assessment Upper Extremity Assessment: LUE deficits/detail;Generalized weakness LUE Deficits / Details: Limited hand and wrist due to pain LUE: Unable to fully assess due to  pain LUE Coordination: decreased fine motor;decreased gross motor   Lower Extremity Assessment Lower Extremity Assessment: Defer to PT evaluation   Cervical / Trunk Assessment Cervical / Trunk Assessment: Kyphotic   Communication Communication Communication: HOH   Cognition Arousal/Alertness: Awake/alert Behavior During Therapy: WFL for tasks assessed/performed Overall Cognitive Status: Impaired/Different from baseline Area of Impairment: Memory;Safety/judgement                     Memory: Decreased short-term memory   Safety/Judgement: Decreased awareness of safety;Decreased awareness of deficits     General Comments: Son present during session and he states she is close to baseline, but little things are "off"   General Comments  Son present for session, and he states that she was getting therapy and he saw a dramatic improvement in function/balance etc. He said that when she stopped therapy there was a marked decrease in safety    Exercises     Shoulder Instructions      Home Living Family/patient expects to be discharged to:: Assisted living                             Home Equipment: Walker - 2 wheels;Cane - single point;Shower seat - built in;Grab bars - toilet;Grab bars - tub/shower;Hand held shower head   Additional Comments: Lives at [facility]F      Prior Functioning/Environment Level of Independence: Independent with assistive device(s)        Comments: Amb with cane or walker; still drives        OT Problem List: Decreased activity tolerance;Impaired balance (sitting and/or standing);Decreased safety awareness;Pain      OT Treatment/Interventions: Self-care/ADL training;DME and/or AE instruction;Therapeutic exercise;Therapeutic activities;Patient/family education;Balance training    OT Goals(Current goals can be found in the care plan section) Acute Rehab OT Goals Patient Stated Goal: [facility] OT Goal  Formulation: With patient/family Time For Goal Achievement: 12/17/16 Potential to Achieve Goals: Good ADL Goals Pt Will Perform Grooming: with supervision;standing Pt Will Perform Upper Body Bathing: with supervision;sitting Pt Will Perform Lower Body Bathing: with supervision;sit to/from stand  OT Frequency: Min 2X/week   Barriers to D/C:    Pt would like to discharge [facility] to [facility]       Co-evaluation              AM-PAC PT "6 Clicks" Daily Activity     Outcome Measure Help from another person eating meals?: None Help from another person taking care of personal grooming?: A Little Help from another person toileting, which includes using toliet, bedpan, or urinal?: A Lot Help from another person bathing (including washing, rinsing, drying)?: A Little Help from another person to put on and taking off regular upper body clothing?: A Little Help from another person to put on and taking off regular lower body clothing?: A Lot 6 Click Score: 17   End of Session Equipment Utilized During Treatment: Gait belt Nurse Communication: Mobility status  Activity Tolerance: Patient tolerated treatment well Patient left: in chair;with nursing/sitter in room;with call bell/phone within reach  OT Visit Diagnosis: Unsteadiness on feet (R26.81);Other abnormalities of gait and mobility (R26.89);Repeated falls (R29.6);History of falling (Z91.81);Other symptoms and signs involving cognitive function  Time: 7035-0093 OT Time Calculation (min): 33 min Charges:  OT General Charges $OT Visit: 1 Visit OT Evaluation $OT Eval Moderate Complexity: 1 Mod OT Treatments $Self Care/Home Management : 8-22 mins G-Codes:     Hulda Humphrey OTR/L [place] 12/03/2016, 4:27 PM

## 2016-12-04 LAB — CBC
HCT: 34.6 % — ABNORMAL LOW (ref 36.0–46.0)
Hemoglobin: 11.6 g/dL — ABNORMAL LOW (ref 12.0–15.0)
MCH: 32.9 pg (ref 26.0–34.0)
MCHC: 33.5 g/dL (ref 30.0–36.0)
MCV: 98 fL (ref 78.0–100.0)
Platelets: 213 10*3/uL (ref 150–400)
RBC: 3.53 MIL/uL — ABNORMAL LOW (ref 3.87–5.11)
RDW: 13.8 % (ref 11.5–15.5)
WBC: 9.9 10*3/uL (ref 4.0–10.5)

## 2016-12-04 LAB — COMPREHENSIVE METABOLIC PANEL
ALT: 13 U/L — ABNORMAL LOW (ref 14–54)
AST: 24 U/L (ref 15–41)
Albumin: 2.7 g/dL — ABNORMAL LOW (ref 3.5–5.0)
Alkaline Phosphatase: 75 U/L (ref 38–126)
Anion gap: 6 (ref 5–15)
BUN: 10 mg/dL (ref 6–20)
CO2: 25 mmol/L (ref 22–32)
Calcium: 8.5 mg/dL — ABNORMAL LOW (ref 8.9–10.3)
Chloride: 104 mmol/L (ref 101–111)
Creatinine, Ser: 0.65 mg/dL (ref 0.44–1.00)
GFR calc Af Amer: >60 mL/min (ref >60)
GFR calc non Af Amer: >60 mL/min (ref >60)
Glucose, Bld: 89 mg/dL (ref 65–99)
Potassium: 4.2 mmol/L (ref 3.5–5.1)
Sodium: 135 mmol/L (ref 135–145)
Total Bilirubin: 0.6 mg/dL (ref 0.3–1.2)
Total Protein: 5.5 g/dL — ABNORMAL LOW (ref 6.5–8.1)

## 2016-12-04 MED ORDER — POLYETHYLENE GLYCOL 3350 17 G PO PACK
17.0000 g | PACK | Freq: Every day | ORAL | Status: DC | PRN
  Administered 2016-12-04 – 2016-12-06 (×2): 17 g via ORAL
  Filled 2016-12-04 (×2): qty 1

## 2016-12-04 MED ORDER — SALINE SPRAY 0.65 % NA SOLN
1.0000 | NASAL | Status: DC | PRN
  Administered 2016-12-04: 1 via NASAL
  Filled 2016-12-04: qty 44

## 2016-12-04 MED ORDER — CENTRUM PO CHEW: 1.0000 | CHEWABLE_TABLET | Freq: Every day | ORAL | Status: DC

## 2016-12-04 MED ORDER — ADULT MULTIVITAMIN W/MINERALS CH
ORAL_TABLET | Freq: Every day | ORAL | Status: DC
  Administered 2016-12-04 – 2016-12-07 (×4): 1 via ORAL
  Filled 2016-12-04 (×4): qty 1

## 2016-12-04 MED ORDER — BIOTIN 5000 MCG PO CAPS: 5000.0000 ug | ORAL_CAPSULE | Freq: Every day | ORAL | Status: DC

## 2016-12-04 NOTE — Progress Notes (Signed)
CSW spoke with pt's son and confirmed that pt is from [facility] has sent over information to facility to assist with discharge needs. At this time CSW continues to follow for further needs.    [name] Antwon Rochin, MSW, [facility] Clinical Social Worker [phone]

## 2016-12-04 NOTE — Progress Notes (Signed)
Vanessa [name] - Stepdown/ICU TEAM  Vanessa [name]  FBP:[id number] DOB: [birth year] DOA: 12/01/2016 PCP: Blanchie Serve, MD    Brief Narrative:  83 y.o. female with a hx of atrial fibrillation, hyperlipidemia, and complete occlusion of L carotid artery who was brought to the ER after a fall.  Patient was walking in the yard when she tripped and fell and hit her face.  Did not lose consciousness.  In the ER patient was found to be in A. fib with RVR.  Patient also had multiple ecchymotic areas on the face.  Sutures were placed for a skin tear.  CT of the head neck maxillofacial and left knee showed only a minimally displaced nasal bone fracture.  X-ray of the left forearm showed no fracture.   Significant Events: 11/22 admit after fall at ALF  Subjective: Appears to be resting comfortably in a bedside chair.  Reports some ongoing pain focused about her nose face and left arm.  Denies chest pain or shortness of breath.  Reports poor appetite and poor intake.  Appears at times to be mildly confused regarding the events prior to her hospitalization.  Assessment & Plan:  Chronic Afib w/ acute RVR CHA2DS2/VASc is 6 - no anticoag due to high fall risk and hx of GIB - rate now well controlled s/p further volume expansion and medication adjustment - follow on tele   Hypotension  Corrected w/ volume resuscitation - follow BP trend     S/p fall w/ injury - minimally displaced nasal bone fx PT/OT suggest SNF - cont rehab efforts - discuss disposition w/ her son/family   Hypothyroidism TSH at goal at [name].27  HLD Cont statin   DVT prophylaxis: SCDs Code Status: FULL CODE Family Communication: no family present at time of exam  Disposition Plan: needs SNF - transfer to tele - watch HR another 24hrs - if HR remains stable will be ready for d/c by 11/25  Consultants:  none  Antimicrobials:  none   Objective: Blood pressure 130/74, pulse 78, temperature 98.7 F (37.[name] C), temperature  source Oral, resp. rate 18, height 5' ([name].524 m), weight 41.8 kg (92 lb [name].6 oz), SpO2 100 %.  Intake/Output Summary (Last 24 hours) at 12/04/2016 1047 Last data filed at 12/04/2016 0900 Gross per 24 hour  Intake 1100 ml  Output 1075 ml  Net 25 ml   Filed Weights   12/01/16 1859 12/03/16 0127  Weight: 41.7 kg (92 lb) 41.8 kg (92 lb [name].6 oz)    Examination: General: No acute respiratory distress - alert - mildly confused  Lungs: CTA th/o w/ no wheezing or crackles  Cardiovascular: irreg irreg - rate controlled at 80bpm - no M or rub  Abdomen: NT/ND, soft, bs+, no mass  Extremities: No signif edema B LE   CBC: Recent Labs  Lab 12/01/16 1904 12/02/16 0511 12/04/16 0220  WBC 8.4 11.[name]* 9.9  NEUTROABS 6.3 8.0*  --   HGB 13.8 11.5* 11.6*  HCT 42.0 35.[name]* 34.6*  MCV 98.4 98.3 98.0  PLT 221 199 824   Basic Metabolic Panel: Recent Labs  Lab 12/01/16 1904 12/02/16 0511 12/04/16 0220  NA 137 138 135  K 4.6 4.2 4.2  CL 100* 103 104  CO2 29 28 25   GLUCOSE 108* 110* 89  BUN 17 15 10   CREATININE 0.77 0.72 0.65  CALCIUM 9.4 8.7* 8.5*  MG  --  [name].9  --    GFR: Estimated Creatinine Clearance: 28.4 mL/min (by C-G formula based  on SCr of 0.65 mg/dL).  Cardiac Enzymes: Recent Labs  Lab 12/02/16 0511  TROPONINI 0.04*    HbA1C: Hemoglobin A1C  Date/Time Value Ref Range Status  07/07/2015 6.4  Final   Hgb A1c MFr Bld  Date/Time Value Ref Range Status  04/05/2014 6.6 (A) 4.0 - 6.0 % Final     Recent Results (from the past 240 hour(s))  Urine culture     Status: Abnormal   Collection Time: 12/01/16  9:14 PM  Result Value Ref Range Status   Specimen Description URINE, RANDOM  Final   Special Requests NONE  Final   Culture MULTIPLE SPECIES PRESENT, SUGGEST RECOLLECTION (A)  Final   Report Status 12/03/2016 FINAL  Final  MRSA PCR Screening     Status: None   Collection Time: 12/02/16  3:15 AM  Result Value Ref Range Status   MRSA by PCR NEGATIVE NEGATIVE Final     Comment:        The GeneXpert MRSA Assay (FDA approved for NASAL specimens only), is one component of a comprehensive MRSA colonization surveillance program. It is not intended to diagnose MRSA infection nor to guide or monitor treatment for MRSA infections.      Scheduled Meds: . atenolol  25 mg Oral BID  . chlorhexidine  15 mL Mouth Rinse BID  . DULoxetine  20 mg Oral QHS  . feeding supplement (ENSURE ENLIVE)  237 mL Oral TID BM  . mouth rinse  15 mL Mouth Rinse q12n4p  . simvastatin  20 mg Oral QHS     LOS: 2 days   Cherene Altes, MD [facility] Office  [phone] Pager - Text Page per Shea Evans as per below:  On-Call/Text Page:      [email or web address]      password [email or web address]  If 7PM-7AM, please contact night-coverage [email or web address] Password [facility] 12/04/2016, 10:47 AM

## 2016-12-04 NOTE — Plan of Care (Signed)
Continue current care plan 

## 2016-12-04 NOTE — NC FL2 (Signed)
[place] MEDICAID FL2 LEVEL OF CARE SCREENING TOOL     IDENTIFICATION  Patient Name: Vanessa [name] Birthdate: 02-13-1920 Sex: female Admission Date (Current Location): 12/01/2016  [facility] and [place] Number:  Herbalist and Address:  [facility] [place], [place] [address], [place], [place] [address]      Provider Number: [id number]  Attending Physician Name and Address:  Cherene Altes, MD  Relative Name and Phone Number:       Current Level of Care: SNF Recommended Level of Care: [place] Prior Approval Number:    Date Approved/Denied:   PASRR Number:    Discharge Plan: SNF    Current Diagnoses: Patient Active Problem List   Diagnosis Date Noted  . Atrial fibrillation with RVR ([place]) 12/02/2016  . Esophageal stenosis   . History of rib fracture 08/04/2016  . Age-related osteoporosis without current pathological fracture 08/04/2016  . Paresthesia 05/12/2016  . Abnormality of gait 04/19/2016  . Urinary frequency 12/09/2015  . OCD (obsessive compulsive disorder) 09/02/2015  . Chest pain 06/12/2015  . Hypothyroidism 04/15/2015  . Suprapubic pain 04/01/2015  . GERD (gastroesophageal reflux disease) 02/11/2015  . Constipation 02/11/2015  . Aspiration into respiratory tract 08/27/2014  . Cricopharyngeal hypertrophy 08/27/2014  . Muscle tension dysphonia 08/27/2014  . 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 Low back pain 08/13/2014  . Carotid stenosis 08/13/2014  . Hoarse 08/13/2014  . Xerostomia 06/18/2014  . Prickling sensation-Left Leg 06/05/2014  . Tingling sensation-Left Leg 06/05/2014  . Swelling of ankle 06/05/2014  . Hyperglycemia 04/02/2014  . Loss of weight 04/02/2014  . DOE (dyspnea on exertion) 04/02/2014  . Idiopathic scoliosis 04/02/2014  . Hearing loss 04/02/2014  . Dysphagia 04/02/2014  . Seborrheic keratoses, inflamed 04/02/2014  . Palliative care encounter 09/12/2013  . Weakness generalized  09/12/2013  . Anxiety state 09/12/2013  . GI bleed 09/10/2013  . Other and unspecified ovarian cyst 09/10/2013  . Fall 09/03/2013  . PVD (peripheral vascular disease) ([place]) 02/21/2013  . Occlusion of left internal carotid artery 08/16/2012  . Varicose veins 12/22/2011  . Malaise and fatigue 10/06/2011  . Raynaud phenomenon 06/26/2010  . Osteoarthritis 06/26/2010  . Atrial fibrillation ([place])   . Neuropathy   . Hypercholesterolemia   . Headache   . Pruritus     Orientation RESPIRATION BLADDER Height & Weight     Self, Time, Situation, Place  Normal Continent Weight: 92 lb 1.6 oz (41.8 kg) Height:  5' (152.4 cm)  BEHAVIORAL SYMPTOMS/MOOD NEUROLOGICAL BOWEL NUTRITION STATUS      Continent Diet(please see discharge summary. )  AMBULATORY STATUS COMMUNICATION OF NEEDS Skin   Extensive Assist   Other (Comment)(laceration on the face. )                       Personal Care Assistance Level of Assistance  Bathing, Feeding, Dressing Bathing Assistance: Maximum assistance Feeding assistance: Limited assistance Dressing Assistance: Maximum assistance     Functional Limitations Info  Sight, Hearing, Speech Sight Info: Adequate Hearing Info: Adequate Speech Info: Adequate    SPECIAL CARE FACTORS FREQUENCY  PT (By licensed PT), OT (By licensed OT)     PT Frequency: 5 times a week  OT Frequency: 5 times a week             Contractures Contractures Info: Not present    Additional Factors Info  Code Status, Allergies Code Status Info: Full Allergies Info: NKA  Current Medications (12/04/2016):  This is the current hospital active medication list Current Facility-Administered Medications  Medication Dose Route Frequency Provider Last Rate Last Dose  . acetaminophen (TYLENOL) tablet 650 mg  650 mg Oral Q6H PRN Rise Patience, MD   650 mg at 12/02/16 1952   Or  . acetaminophen (TYLENOL) suppository 650 mg  650 mg Rectal Q6H PRN Rise Patience,  MD      . atenolol (TENORMIN) tablet 25 mg  25 mg Oral BID Cherene Altes, MD   25 mg at 12/04/16 0925  . chlorhexidine (PERIDEX) 0.12 % solution 15 mL  15 mL Mouth Rinse BID Rise Patience, MD   15 mL at 12/04/16 0925  . DULoxetine (CYMBALTA) DR capsule 20 mg  20 mg Oral QHS Rise Patience, MD   20 mg at 12/03/16 2123  . feeding supplement (ENSURE ENLIVE) (ENSURE ENLIVE) liquid 237 mL  237 mL Oral TID BM Cherene Altes, MD   237 mL at 12/04/16 1324  . MEDLINE mouth rinse  15 mL Mouth Rinse q12n4p Rise Patience, MD   15 mL at 12/04/16 1225  . multivitamin with minerals tablet   Oral Daily Cherene Altes, MD   1 tablet at 12/04/16 1225  . ondansetron (ZOFRAN) tablet 4 mg  4 mg Oral Q6H PRN Rise Patience, MD       Or  . ondansetron [facility]) injection 4 mg  4 mg Intravenous Q6H PRN Rise Patience, MD      . oxyCODONE (Oxy IR/ROXICODONE) immediate release tablet 5 mg  5 mg Oral Q6H PRN Cherene Altes, MD      . polyethylene glycol (MIRALAX / GLYCOLAX) packet 17 g  17 g Oral Daily PRN Cherene Altes, MD   17 g at 12/04/16 1225  . simvastatin (ZOCOR) tablet 20 mg  20 mg Oral QHS Rise Patience, MD   20 mg at 12/03/16 2123  . sodium chloride (OCEAN) 0.65 % nasal spray 1 spray  1 spray Each Nare PRN Cherene Altes, MD      . traMADol Veatrice Bourbon) tablet 50 mg  50 mg Oral Q6H PRN Cherene Altes, MD         Discharge Medications: Please see discharge summary for a list of discharge medications.  Relevant Imaging Results:  Relevant Lab Results:   Additional Information SSN- [id number]  Wetzel Bjornstad, LCSWA

## 2016-12-04 NOTE — Progress Notes (Signed)
Cancelled transfer order to 3E27 due to pt having a 2.16 sec pause

## 2016-12-04 NOTE — Progress Notes (Signed)
Report called pt to transfer to 3E27.

## 2016-12-05 MED ORDER — SODIUM CHLORIDE 0.9 % IV BOLUS (SEPSIS)
250.0000 mL | Freq: Once | INTRAVENOUS | Status: AC
  Administered 2016-12-05: 250 mL via INTRAVENOUS

## 2016-12-05 MED ORDER — SODIUM CHLORIDE 0.9 % IV SOLN
INTRAVENOUS | Status: DC
Start: 2016-12-05 — End: 2016-12-06
  Administered 2016-12-05: 50 mL/h via INTRAVENOUS

## 2016-12-05 MED ORDER — ATENOLOL 12.5 MG HALF TABLET
12.5000 mg | ORAL_TABLET | Freq: Two times a day (BID) | ORAL | Status: DC
  Administered 2016-12-06 – 2016-12-07 (×3): 12.5 mg via ORAL
  Filled 2016-12-05 (×5): qty 1

## 2016-12-05 NOTE — Clinical Social Work Note (Signed)
Clinical Social Work Assessment  Patient Details  Name: Vanessa [name] MRN: [id number] Date of Birth: 02/19/1920  Date of referral:  12/05/16               Reason for consult:  Facility Placement                Permission sought to share information with:  Chartered certified accountant granted to share information::  Yes, Verbal Permission Granted  Name::     Mariluz Crespo  Agency::  SNF-[facility]  Relationship::  son  Contact Information:     Housing/Transportation Living arrangements for the past 2 months:  [place] of Information:  Patient Patient Interpreter Needed:  None Criminal Activity/Legal Involvement Pertinent to Current Situation/Hospitalization:  No - Comment as needed Significant Relationships:  Adult Children Lives with:  Facility Resident Do you feel safe going back to the place where you live?  Yes Need for family participation in patient care:  No (Coment)  Care giving concerns:  Pt resides at [facility] and will likely return for short term rehab. Pt ambulated with cane and walker. Pt responsible for self and has a son that is involved in care.   Social Worker assessment / plan:  CSW met with patient at bedside to discuss placement options for discharge. CSW explained her role. Pt amenable with returning to [facility] at discharge. CSW will f/u with facility. CSW will assist with transport at appropriate time.  CSW will assist with disposition.  Employment status:  Retired Forensic scientist:  Medicare PT Recommendations:  [place] / Referral to community resources:  [place]  Patient/Family's Response to care:  Patient appreciative of CSW assistance with placement/returning to [facility]. No other issues or concerns identified.  Patient/Family's Understanding of and Emotional Response to Diagnosis, Current Treatment, and Prognosis:  Patient has  good understanding of diagnosis, current treatment, and prognosis as she identifies that she will need short term rehab at dc. Pt desires to return to [facility] for short term rehab.  Emotional Assessment Appearance:  Appears stated age Attitude/Demeanor/Rapport:  (Cooperative and Pleasant) Affect (typically observed):  Accepting, Appropriate Orientation:  Oriented to Situation, Oriented to  Time, Oriented to Place, Oriented to Self Alcohol / Substance use:  Not Applicable Psych involvement (Current and /or in the community):  No (Comment)  Discharge Needs  Concerns to be addressed:  Discharge Planning Concerns Readmission within the last 30 days:  No Current discharge risk:  Physical Impairment, Dependent with Mobility Barriers to Discharge:  No Barriers Identified   Normajean Baxter, LCSW 12/05/2016, 4:19 PM

## 2016-12-05 NOTE — Progress Notes (Signed)
Vanessa [name] TEAM 1 - Stepdown/ICU TEAM  JESSLYNN KRUCK  WEX:[id number] DOB: [birth year] DOA: 12/01/2016 PCP: Blanchie Serve, MD    Brief Narrative:  83 y.o. female with a hx of atrial fibrillation, hyperlipidemia, and complete occlusion of L carotid artery who was brought to the ER after a fall.  Patient was walking in the yard when she tripped and fell and hit her face.  Did not lose consciousness.  In the ER patient was found to be in A. fib with RVR.  Patient also had multiple ecchymotic areas on the face.  Sutures were placed for a skin tear.  CT of the head neck maxillofacial and left knee showed only a minimally displaced nasal bone fracture.  X-ray of the left forearm showed no fracture.   Significant Events: 11/22 admit after fall at ALF  Subjective: Patient was observed to have an asymptomatic 2-second pause on telemetry yesterday.  Otherwise she has no complaints whatsoever.  She denies chest pain shortness breath fevers or chills.  Her nurse has noted that she desaturates when she attempts to ambulate.  She continues to tell me that she has not interested in escalating to a skilled nursing facility level of care.  Assessment & Plan:  Chronic Afib w/ acute RVR CHA2DS2/VASc is 6 - no anticoag due to high fall risk and hx of GIB - rate now well controlled s/p further volume expansion and medication adjustment - cont to follow on tele   Hypotension - resolved  Corrected w/ volume resuscitation    S/p fall w/ injury - minimally displaced nasal bone fx PT/OT suggest SNF but she is currently resisting - cont rehab efforts - discuss disposition w/ her son/family - ask PT/OT to re-eval on 11/26 AM   Desaturation w/ exertion  ?ATX and splinting - check sats when up moving around - f/u CXR   Hypothyroidism TSH at goal at 1.27  HLD Cont statin   DVT prophylaxis: SCDs Code Status: FULL CODE Family Communication: no family present at time of exam  Disposition Plan: needs SNF but  refusing - has improved enogh may be ok for ALF w/ HH? - PT/OT re-eval 11/26 - ambulatory sat - plan for d/c 11/26  Consultants:  none  Antimicrobials:  none   Objective: Blood pressure 117/69, pulse 98, temperature 98.3 F (36.8 C), temperature source Oral, resp. rate 16, height 5' (1.524 m), weight 41.8 kg (92 lb 1.6 oz), SpO2 93 %.  Intake/Output Summary (Last 24 hours) at 12/05/2016 1110 Last data filed at 12/05/2016 0826 Gross per 24 hour  Intake 960 ml  Output 975 ml  Net -15 ml   Filed Weights   12/01/16 1859 12/03/16 0127  Weight: 41.7 kg (92 lb) 41.8 kg (92 lb 1.6 oz)    Examination: General: No acute respiratory distress - alert and pleasant - extensive bruising of face improving w/ signif less facial swelling today  Lungs: CTA th/o - no wheeze - no crackles  Cardiovascular: irreg irreg - no M or rub  Abdomen: NT/ND, soft, bs+, no mass  Extremities: No signif edema B LE   CBC: Recent Labs  Lab 12/01/16 1904 12/02/16 0511 12/04/16 0220  WBC 8.4 11.1* 9.9  NEUTROABS 6.3 8.0*  --   HGB 13.8 11.5* 11.6*  HCT 42.0 35.1* 34.6*  MCV 98.4 98.3 98.0  PLT 221 199 938   Basic Metabolic Panel: Recent Labs  Lab 12/01/16 1904 12/02/16 0511 12/04/16 0220  NA 137 138 135  K 4.6  4.2 4.2  CL 100* 103 104  CO2 29 28 25   GLUCOSE 108* 110* 89  BUN 17 15 10   CREATININE 0.77 0.72 0.65  CALCIUM 9.4 8.7* 8.5*  MG  --  1.9  --    GFR: Estimated Creatinine Clearance: 28.4 mL/min (by C-G formula based on SCr of 0.65 mg/dL).  Cardiac Enzymes: Recent Labs  Lab 12/02/16 0511  TROPONINI 0.04*    HbA1C: Hemoglobin A1C  Date/Time Value Ref Range Status  07/07/2015 6.4  Final   Hgb A1c MFr Bld  Date/Time Value Ref Range Status  04/05/2014 6.6 (A) 4.0 - 6.0 % Final     Recent Results (from the past 240 hour(s))  Urine culture     Status: Abnormal   Collection Time: 12/01/16  9:14 PM  Result Value Ref Range Status   Specimen Description URINE, RANDOM  Final     Special Requests NONE  Final   Culture MULTIPLE SPECIES PRESENT, SUGGEST RECOLLECTION (A)  Final   Report Status 12/03/2016 FINAL  Final  MRSA PCR Screening     Status: None   Collection Time: 12/02/16  3:15 AM  Result Value Ref Range Status   MRSA by PCR NEGATIVE NEGATIVE Final    Comment:        The GeneXpert MRSA Assay (FDA approved for NASAL specimens only), is one component of a comprehensive MRSA colonization surveillance program. It is not intended to diagnose MRSA infection nor to guide or monitor treatment for MRSA infections.      Scheduled Meds: . atenolol  25 mg Oral BID  . chlorhexidine  15 mL Mouth Rinse BID  . DULoxetine  20 mg Oral QHS  . feeding supplement (ENSURE ENLIVE)  237 mL Oral TID BM  . mouth rinse  15 mL Mouth Rinse q12n4p  . multivitamin with minerals   Oral Daily  . simvastatin  20 mg Oral QHS     LOS: 3 days   Cherene Altes, MD [facility] Office  [phone] Pager - Text Page per Amion as per below:  On-Call/Text Page:      [email or web address]      password [email or web address]  If 7PM-7AM, please contact night-coverage [email or web address] Password [email or web address] 12/05/2016, 11:10 AM

## 2016-12-06 ENCOUNTER — Inpatient Hospital Stay (HOSPITAL_COMMUNITY): Payer: Medicare Other

## 2016-12-06 LAB — LACTIC ACID, PLASMA
Lactic Acid, Venous: 1 mmol/L (ref 0.5–1.9)
Lactic Acid, Venous: 0.8 mmol/L (ref 0.5–1.9)

## 2016-12-06 MED ORDER — BACITRACIN-NEOMYCIN-POLYMYXIN OINTMENT TUBE
TOPICAL_OINTMENT | Freq: Three times a day (TID) | CUTANEOUS | Status: DC
  Administered 2016-12-06: 1 via TOPICAL
  Administered 2016-12-06: 12:00:00 via TOPICAL
  Administered 2016-12-07: 1 via TOPICAL
  Administered 2016-12-07: 16:00:00 via TOPICAL
  Filled 2016-12-06: qty 14.17

## 2016-12-06 NOTE — Progress Notes (Signed)
[place] TEAM 1 - Stepdown/ICU TEAM  GEORGEANNE FRANKLAND  HAL:[id number] DOB: 1920/09/14 DOA: 12/01/2016 PCP: Blanchie Serve, MD    Brief Narrative:  83 y.o. female with a hx of atrial fibrillation, hyperlipidemia, and complete occlusion of L carotid artery who was brought to the ER after a fall.  Patient was walking in the yard when she tripped and fell and hit her face.  Did not lose consciousness.  In the ER patient was found to be in A. fib with RVR.  Patient had multiple ecchymotic areas on the face.  Sutures were placed in the forehead and the nose.  CT of the head neck maxillofacial and left knee showed only a minimally displaced nasal bone fracture.  X-ray of the left forearm showed no fracture.   Significant Events: 11/22 admit after fall at ALF  Subjective: Sitting comfortably in a bedside chair on RA.  Denies cp, n/v, or abdom pain.  Persists in telling me she prefers to go back to her ALF level of care.    Assessment & Plan:  Chronic Afib w/ acute RVR CHA2DS2/VASc is 6 - no anticoag due to high fall risk and hx of GIB - rate has been a bit unpredictable over the last 24hrs - she has been further volume expanded - BB dose had to be decreased due to hypotension - follow on tele another 24hrs to assure HR stable    Hypotension - recurrent  Has required resumption of IVF over last 24hrs due to recurrence of hypotension - BB dose reduced - stop IVF this morning and monitor BP another 24hrs     S/p fall w/ injury - minimally displaced nasal bone fx PT/OT suggest SNF but she is currently resisting - cont rehab efforts - PT/OT to continue to work w/ patient    Desaturation w/ exertion  ?ATX and splinting - f/u CXR ordered for today - formally check sats when ambulating - currently 96% on RA at rest   Hypothyroidism TSH at goal at 1.27  HLD Cont statin   DVT prophylaxis: SCDs Code Status: FULL CODE Family Communication: no family present at time of exam  Disposition Plan:  ?needs SNF but prefers ALF - has improved enough may be ok for ALF w/ HH? - PT/OT re-eval 11/26 - ambulatory sat - f/u CXR  Consultants:  none  Antimicrobials:  none   Objective: Blood pressure 139/85, pulse 66, temperature 98.2 F (36.8 C), temperature source Oral, resp. rate 18, height 5' (1.524 m), weight 39.9 kg (88 lb), SpO2 99 %.  Intake/Output Summary (Last 24 hours) at 12/06/2016 0901 Last data filed at 12/06/2016 0724 Gross per 24 hour  Intake 975.83 ml  Output -  Net 975.83 ml   Filed Weights   12/01/16 1859 12/03/16 0127 12/06/16 0356  Weight: 41.7 kg (92 lb) 41.8 kg (92 lb 1.6 oz) 39.9 kg (88 lb)    Examination: General: No acute distress - alert - extensive bruising of face stable w/ near resolution of facial swelling  Lungs: mild bibasilar crackles - no wheezing  Cardiovascular: irreg irreg - no M or rub - rate presently controlled  Abdomen: NT/ND, soft, bs+, no mass  Extremities: No edema B LE   CBC: Recent Labs  Lab 12/01/16 1904 12/02/16 0511 12/04/16 0220  WBC 8.4 11.1* 9.9  NEUTROABS 6.3 8.0*  --   HGB 13.8 11.5* 11.6*  HCT 42.0 35.1* 34.6*  MCV 98.4 98.3 98.0  PLT 221 199 213   Basic  Metabolic Panel: Recent Labs  Lab 12/01/16 1904 12/02/16 0511 12/04/16 0220  NA 137 138 135  K 4.6 4.2 4.2  CL 100* 103 104  CO2 29 28 25   GLUCOSE 108* 110* 89  BUN 17 15 10   CREATININE 0.77 0.72 0.65  CALCIUM 9.4 8.7* 8.5*  MG  --  1.9  --    GFR: Estimated Creatinine Clearance: 27.1 mL/min (by C-G formula based on SCr of 0.65 mg/dL).  Cardiac Enzymes: Recent Labs  Lab 12/02/16 0511  TROPONINI 0.04*    HbA1C: Hemoglobin A1C  Date/Time Value Ref Range Status  07/07/2015 6.4  Final   Hgb A1c MFr Bld  Date/Time Value Ref Range Status  04/05/2014 6.6 (A) 4.0 - 6.0 % Final     Recent Results (from the past 240 hour(s))  Urine culture     Status: Abnormal   Collection Time: 12/01/16  9:14 PM  Result Value Ref Range Status   Specimen  Description URINE, RANDOM  Final   Special Requests NONE  Final   Culture MULTIPLE SPECIES PRESENT, SUGGEST RECOLLECTION (A)  Final   Report Status 12/03/2016 FINAL  Final  MRSA PCR Screening     Status: None   Collection Time: 12/02/16  3:15 AM  Result Value Ref Range Status   MRSA by PCR NEGATIVE NEGATIVE Final    Comment:        The GeneXpert MRSA Assay (FDA approved for NASAL specimens only), is one component of a comprehensive MRSA colonization surveillance program. It is not intended to diagnose MRSA infection nor to guide or monitor treatment for MRSA infections.      Scheduled Meds: . atenolol  12.5 mg Oral BID  . chlorhexidine  15 mL Mouth Rinse BID  . DULoxetine  20 mg Oral QHS  . feeding supplement (ENSURE ENLIVE)  237 mL Oral TID BM  . mouth rinse  15 mL Mouth Rinse q12n4p  . multivitamin with minerals   Oral Daily  . simvastatin  20 mg Oral QHS     LOS: 4 days   Cherene Altes, MD [facility] Office  [phone] Pager - Text Page per Amion as per below:  On-Call/Text Page:      [email or web address]      password [email or web address]  If 7PM-7AM, please contact night-coverage [email or web address] Password [email or web address] 12/06/2016, 9:01 AM

## 2016-12-06 NOTE — Plan of Care (Signed)
Continue current care plan 

## 2016-12-06 NOTE — Progress Notes (Signed)
Physical Therapy Treatment Patient Details Name: Vanessa [name] MRN: [id number] DOB: 1920-06-14 Today's Date: 12/06/2016    History of Present Illness Pt adm after fall and found to have afib with rvr. Pt also with nasal fx and multiple skin tears and bruises on face as well as lt forearm and hand bruising. PMH - afib, peripheral neuropathy, scoliosis, sciatica (getting shots), and vertigo    PT Comments    Patient progressing some, but remains very high fall risk due to decreased safety awareness needing cues throughout session to use walker and easily distracted focusing on her bowel issues and needing stay in hospital more than her mobility issues.  Feel she remains appropriate for SNF level rehab at d/c.   Follow Up Recommendations  SNF;Supervision/Assistance - 24 hour     Equipment Recommendations  None recommended by PT    Recommendations for Other Services       Precautions / Restrictions Precautions Precautions: Fall    Mobility  Bed Mobility               General bed mobility comments: up in recliner  Transfers Overall transfer level: Needs assistance Equipment used: Rolling walker (2 wheeled) Transfers: Sit to/from Stand Sit to Stand: Min assist         General transfer comment: assist for balance, cues for hand placement, up from recliner and BSC over commode  Ambulation/Gait Ambulation/Gait assistance: Mod assist;Min assist Ambulation Distance (Feet): 20 Feet(x 2) Assistive device: Rolling walker (2 wheeled) Gait Pattern/deviations: Step-through pattern;Shuffle;Trunk flexed;Decreased stride length     General Gait Details: assist for balance, safety, into and out of bathroom sideways with assist for walker   Stairs            Wheelchair Mobility    Modified Rankin (Stroke Patients Only)       Balance Overall balance assessment: Needs assistance Sitting-balance support: Feet supported Sitting balance-Leahy Scale: Fair Sitting  balance - Comments: seated at edge of chair with S   Standing balance support: During functional activity Standing balance-Leahy Scale: Poor Standing balance comment: LOB posterior with mod A for recovery when washing hands at sink; cues to reach for walker upon standing as well due to decreased safety awarness and poor balance                            Cognition Arousal/Alertness: Awake/alert Behavior During Therapy: WFL for tasks assessed/performed Overall Cognitive Status: Impaired/Different from baseline Area of Impairment: Memory;Safety/judgement                     Memory: Decreased short-term memory   Safety/Judgement: Decreased awareness of safety;Decreased awareness of deficits            Exercises      General Comments        Pertinent Vitals/Pain Faces Pain Scale: Hurts little more Pain Location: rectal discomfort Pain Descriptors / Indicators: Discomfort Pain Intervention(s): Monitored during session;Patient requesting pain meds-RN notified    Home Living                      Prior Function            PT Goals (current goals can now be found in the care plan section) Progress towards PT goals: Progressing toward goals    Frequency           PT Plan Current plan remains appropriate  Co-evaluation PT/OT/SLP Co-Evaluation/Treatment: Yes Reason for Co-Treatment: To address functional/ADL transfers PT goals addressed during session: Mobility/safety with mobility;Balance;Proper use of DME        AM-PAC PT "6 Clicks" Daily Activity  Outcome Measure  Difficulty turning over in bed (including adjusting bedclothes, sheets and blankets)?: Unable Difficulty moving from lying on back to sitting on the side of the bed? : Unable Difficulty sitting down on and standing up from a chair with arms (e.g., wheelchair, bedside commode, etc,.)?: Unable Help needed moving to and from a bed to chair (including a wheelchair)?: A  Little Help needed walking in hospital room?: A Little Help needed climbing 3-5 steps with a railing? : A Little 6 Click Score: 12    End of Session Equipment Utilized During Treatment: Gait belt Activity Tolerance: Patient tolerated treatment well Patient left: in chair;with chair alarm set Nurse Communication: Other (comment)(need for meds due to constipation) PT Visit Diagnosis: Other abnormalities of gait and mobility (R26.89);Muscle weakness (generalized) (M62.81);History of falling (Z91.81)     Time: 5993-5701 PT Time Calculation (min) (ACUTE ONLY): 38 min  Charges:  $Gait Training: 8-22 mins                    G CodesMagda Kiel, [place] [phone] 12/06/2016    Reginia Naas 12/06/2016, 11:57 AM

## 2016-12-06 NOTE — Progress Notes (Signed)
SATURATION QUALIFICATIONS: (This note is used to comply with regulatory documentation for home oxygen)  Patient Saturations on Room Air at Rest = 99%  Patient Saturations on Room Air while Ambulating = 84%  Patient Saturations on 2 Liters of oxygen while Ambulating = 91%  Please briefly explain why patient needs home oxygen:

## 2016-12-06 NOTE — Progress Notes (Addendum)
Occupational Therapy Treatment Patient Details Name: Vanessa [name] MRN: [id number] DOB: 08/24/1920 Today's Date: 12/06/2016    History of present illness Pt adm after fall and found to have afib with rvr. Pt also with nasal fx and multiple skin tears and bruises on face as well as lt forearm and hand bruising. PMH - afib, peripheral neuropathy, scoliosis, sciatica (getting shots), and vertigo   OT comments  Pt progressing toward OT goals. She continues to demonstrate poor safety awareness and short term memory impacting her independence and safety with ADL. She demonstrated multiple instances of loss of balance during session today requiring mod assist to correct. Pt was able to complete toilet transfers and standing grooming tasks with up to mod assist. She would continue to benefit from OT services while admitted and continue to recommend SNF level rehabilitation post-acute D/C.   Follow Up Recommendations  SNF;Supervision/Assistance - 24 hour    Equipment Recommendations  None recommended by OT(defer to next venue)    Recommendations for Other Services      Precautions / Restrictions Precautions Precautions: Fall Restrictions Weight Bearing Restrictions: No       Mobility Bed Mobility               General bed mobility comments: up in recliner  Transfers Overall transfer level: Needs assistance Equipment used: Rolling walker (2 wheeled) Transfers: Sit to/from Stand Sit to Stand: Min assist         General transfer comment: assist for balance, cues for hand placement, up from recliner and BSC over commode    Balance Overall balance assessment: Needs assistance Sitting-balance support: Feet supported Sitting balance-Leahy Scale: Fair Sitting balance - Comments: seated at edge of chair with Supervision   Standing balance support: During functional activity Standing balance-Leahy Scale: Poor Standing balance comment: LOB posterior with mod A for recovery  when washing hands at sink; cues to reach for walker upon standing as well due to decreased safety awarness and poor balance                           ADL either performed or assessed with clinical judgement   ADL Overall ADL's : Needs assistance/impaired Eating/Feeding: Modified independent;Sitting   Grooming: Wash/dry hands;Minimal assistance;Standing Grooming Details (indicate cue type and reason): Assist for balance with frequent posterior LOB. Mod assist to correct balance.                 Toilet Transfer: Minimal assistance;Ambulation;RW;Moderate assistance Toilet Transfer Details (indicate cue type and reason): Mod assist on a few occasions to correct balance. Toileting- Clothing Manipulation and Hygiene: Minimal assistance;Sit to/from stand Toileting - Clothing Manipulation Details (indicate cue type and reason): Min assist for balance     Functional mobility during ADLs: Minimal assistance;Rolling walker;Moderate assistance       Vision       Perception     Praxis      Cognition Arousal/Alertness: Awake/alert Behavior During Therapy: WFL for tasks assessed/performed Overall Cognitive Status: Impaired/Different from baseline Area of Impairment: Memory;Safety/judgement                     Memory: Decreased short-term memory   Safety/Judgement: Decreased awareness of safety;Decreased awareness of deficits     General Comments: Pt with decreased safety awareness and repeating herself frequently today.         Exercises     Shoulder Instructions       General  Comments      Pertinent Vitals/ Pain       Pain Assessment: Faces Faces Pain Scale: Hurts a little bit Pain Location: rectal discomfort Pain Descriptors / Indicators: Discomfort Pain Intervention(s): Monitored during session;Patient requesting pain meds-RN notified  Home Living                                          Prior Functioning/Environment               Frequency  Min 2X/week        Progress Toward Goals  OT Goals(current goals can now be found in the care plan section)  Progress towards OT goals: Progressing toward goals  Acute Rehab OT Goals Patient Stated Goal: [facility] to [facility] OT Goal Formulation: With patient Time For Goal Achievement: 12/17/16 Potential to Achieve Goals: Good ADL Goals Pt Will Perform Grooming: with supervision;standing Pt Will Perform Upper Body Bathing: with supervision;sitting Pt Will Perform Lower Body Bathing: with supervision;sit to/from stand  Plan      Co-evaluation      Reason for Co-Treatment: To address functional/ADL transfers PT goals addressed during session: Mobility/safety with mobility;Balance;Proper use of DME        AM-PAC PT "6 Clicks" Daily Activity     Outcome Measure   Help from another person eating meals?: None Help from another person taking care of personal grooming?: A Little Help from another person toileting, which includes using toliet, bedpan, or urinal?: A Lot Help from another person bathing (including washing, rinsing, drying)?: A Little Help from another person to put on and taking off regular upper body clothing?: A Little Help from another person to put on and taking off regular lower body clothing?: A Lot 6 Click Score: 17    End of Session Equipment Utilized During Treatment: Gait belt  OT Visit Diagnosis: Unsteadiness on feet (R26.81);Other abnormalities of gait and mobility (R26.89);Repeated falls (R29.6);History of falling (Z91.81);Other symptoms and signs involving cognitive function   Activity Tolerance Patient tolerated treatment well   Patient Left in chair;with call bell/phone within reach;with chair alarm set   Nurse Communication Mobility status        Time: 6160-7371 OT Time Calculation (min): 43 min  Charges: OT General Charges $OT Visit: 1 Visit OT Treatments $Self Care/Home Management : 23-37  mins  Vanessa Herrlich, MS OTR/L  Pager: Vanessa [name] 12/06/2016, 1:56 PM

## 2016-12-07 DIAGNOSIS — R1314 Dysphagia, pharyngoesophageal phase: Secondary | ICD-10-CM | POA: Diagnosis not present

## 2016-12-07 DIAGNOSIS — I5020 Unspecified systolic (congestive) heart failure: Secondary | ICD-10-CM | POA: Diagnosis not present

## 2016-12-07 DIAGNOSIS — E038 Other specified hypothyroidism: Secondary | ICD-10-CM

## 2016-12-07 DIAGNOSIS — R531 Weakness: Secondary | ICD-10-CM | POA: Diagnosis not present

## 2016-12-07 DIAGNOSIS — R1312 Dysphagia, oropharyngeal phase: Secondary | ICD-10-CM | POA: Diagnosis not present

## 2016-12-07 DIAGNOSIS — W19XXXA Unspecified fall, initial encounter: Secondary | ICD-10-CM | POA: Diagnosis not present

## 2016-12-07 DIAGNOSIS — R0789 Other chest pain: Secondary | ICD-10-CM | POA: Diagnosis not present

## 2016-12-07 DIAGNOSIS — R2681 Unsteadiness on feet: Secondary | ICD-10-CM | POA: Diagnosis not present

## 2016-12-07 DIAGNOSIS — E43 Unspecified severe protein-calorie malnutrition: Secondary | ICD-10-CM

## 2016-12-07 DIAGNOSIS — R260 Ataxic gait: Secondary | ICD-10-CM | POA: Diagnosis not present

## 2016-12-07 DIAGNOSIS — E7800 Pure hypercholesterolemia, unspecified: Secondary | ICD-10-CM | POA: Diagnosis not present

## 2016-12-07 DIAGNOSIS — S0990XA Unspecified injury of head, initial encounter: Secondary | ICD-10-CM | POA: Diagnosis not present

## 2016-12-07 DIAGNOSIS — G629 Polyneuropathy, unspecified: Secondary | ICD-10-CM | POA: Diagnosis not present

## 2016-12-07 DIAGNOSIS — S0181XD Laceration without foreign body of other part of head, subsequent encounter: Secondary | ICD-10-CM | POA: Diagnosis not present

## 2016-12-07 DIAGNOSIS — Z9181 History of falling: Secondary | ICD-10-CM | POA: Diagnosis not present

## 2016-12-07 DIAGNOSIS — E039 Hypothyroidism, unspecified: Secondary | ICD-10-CM | POA: Diagnosis not present

## 2016-12-07 DIAGNOSIS — F422 Mixed obsessional thoughts and acts: Secondary | ICD-10-CM | POA: Diagnosis not present

## 2016-12-07 DIAGNOSIS — D649 Anemia, unspecified: Secondary | ICD-10-CM | POA: Diagnosis not present

## 2016-12-07 DIAGNOSIS — R1310 Dysphagia, unspecified: Secondary | ICD-10-CM | POA: Diagnosis not present

## 2016-12-07 DIAGNOSIS — M6281 Muscle weakness (generalized): Secondary | ICD-10-CM | POA: Diagnosis not present

## 2016-12-07 DIAGNOSIS — S0450XA Injury of facial nerve, unspecified side, initial encounter: Secondary | ICD-10-CM | POA: Diagnosis not present

## 2016-12-07 DIAGNOSIS — R278 Other lack of coordination: Secondary | ICD-10-CM | POA: Diagnosis not present

## 2016-12-07 DIAGNOSIS — S022XXA Fracture of nasal bones, initial encounter for closed fracture: Secondary | ICD-10-CM | POA: Diagnosis not present

## 2016-12-07 DIAGNOSIS — I482 Chronic atrial fibrillation: Secondary | ICD-10-CM | POA: Diagnosis not present

## 2016-12-07 DIAGNOSIS — S022XXD Fracture of nasal bones, subsequent encounter for fracture with routine healing: Secondary | ICD-10-CM | POA: Diagnosis not present

## 2016-12-07 DIAGNOSIS — I4891 Unspecified atrial fibrillation: Secondary | ICD-10-CM | POA: Diagnosis not present

## 2016-12-07 DIAGNOSIS — I959 Hypotension, unspecified: Secondary | ICD-10-CM | POA: Diagnosis not present

## 2016-12-07 DIAGNOSIS — M545 Low back pain: Secondary | ICD-10-CM | POA: Diagnosis not present

## 2016-12-07 LAB — BASIC METABOLIC PANEL
Anion gap: 7 (ref 5–15)
BUN: 13 mg/dL (ref 6–20)
CO2: 26 mmol/L (ref 22–32)
Calcium: 8.9 mg/dL (ref 8.9–10.3)
Chloride: 102 mmol/L (ref 101–111)
Creatinine, Ser: 0.72 mg/dL (ref 0.44–1.00)
GFR calc Af Amer: >60 mL/min (ref >60)
GFR calc non Af Amer: >60 mL/min (ref >60)
Glucose, Bld: 100 mg/dL — ABNORMAL HIGH (ref 65–99)
Potassium: 3.9 mmol/L (ref 3.5–5.1)
Sodium: 135 mmol/L (ref 135–145)

## 2016-12-07 LAB — MAGNESIUM: Magnesium: 2.1 mg/dL (ref 1.7–2.4)

## 2016-12-07 MED ORDER — ATENOLOL 25 MG PO TABS: 12.5000 mg | ORAL_TABLET | Freq: Two times a day (BID) | ORAL | 0 refills | Status: DC

## 2016-12-07 MED ORDER — ONDANSETRON HCL 4 MG PO TABS: 4.0000 mg | ORAL_TABLET | Freq: Four times a day (QID) | ORAL | 0 refills | Status: DC | PRN

## 2016-12-07 MED ORDER — BACITRACIN-NEOMYCIN-POLYMYXIN OINTMENT TUBE: 1.0000 "application " | TOPICAL_OINTMENT | Freq: Three times a day (TID) | CUTANEOUS | 0 refills | Status: DC

## 2016-12-07 MED ORDER — ENSURE ENLIVE PO LIQD: 237.0000 mL | Freq: Three times a day (TID) | ORAL | 12 refills | Status: DC

## 2016-12-07 MED ORDER — TRAMADOL HCL 50 MG PO TABS: 50.0000 mg | ORAL_TABLET | Freq: Four times a day (QID) | ORAL | 0 refills | Status: DC | PRN

## 2016-12-07 MED ORDER — OXYCODONE HCL 5 MG PO TABS: 5.0000 mg | ORAL_TABLET | Freq: Four times a day (QID) | ORAL | 0 refills | Status: DC | PRN

## 2016-12-07 NOTE — Clinical Social Work Placement (Signed)
   CLINICAL SOCIAL WORK PLACEMENT  NOTE [facility]  Date:  12/07/2016  Patient Details  Name: Vanessa [name] MRN: [id number] Date of Birth: 1920/09/17  Clinical Social Work is seeking post-discharge placement for this patient at the [place] level of care (*CSW will initial, date and re-position this form in  chart as items are completed):  Yes   Patient/family provided with [place] Work Department's list of facilities offering this level of care within the geographic area requested by the patient (or if unable, by the patient's family).  Yes   Patient/family informed of their freedom to choose among providers that offer the needed level of care, that participate in Medicare, Medicaid or managed care program needed by the patient, have an available bed and are willing to accept the patient.  Yes   Patient/family informed of [facility]'s ownership interest in [facility] and [facility], as well as of the fact that they are under no obligation to receive care at these facilities.  PASRR submitted to EDS on       PASRR number received on       Existing PASRR number confirmed on 12/04/16     FL2 transmitted to all facilities in geographic area requested by pt/family on 12/04/16     FL2 transmitted to all facilities within larger geographic area on       Patient informed that his/her managed care company has contracts with or will negotiate with certain facilities, including the following:        Yes   Patient/family informed of bed offers received.  Patient chooses bed at [facility]     Physician recommends and patient chooses bed at      Patient to be transferred to [facility] on 12/07/16.  Patient to be transferred to facility by [facility]     Patient family notified on 12/07/16 of transfer.  Name of family member notified:  Vanessa [name] son [phone]     PHYSICIAN       Additional  Comment:    _______________________________________________ Alexander Mt, [place] 12/07/2016, 3:20 PM

## 2016-12-07 NOTE — Social Work (Signed)
Clinical Social Worker facilitated patient discharge including contacting patient family and facility to confirm patient discharge plans. Clinical information faxed to facility and family agreeable with plan.  CSW arranged ambulance transport via [facility] to [facility]. RN to call [phone] report  prior to discharge.  Clinical Social Worker will sign off for now as social work intervention is no longer needed. Please consult [place] again if new need arises.  Alexander Mt, [place] Social Worker

## 2016-12-07 NOTE — Social Work (Addendum)
CSW was informed by RN Case Manager that patient was requested to be discharged to ALF if possible at [facility] followed up with [facility] about this request, was informed that ALF nursing was going to review PT/OT recommendations and that they would follow up with CSW when they knew more.   CSW met with patient and son at bedside to let them know that CSW had contacted [facility] about ALF request, but that PT continues to recommend SNF placement. Patient preference was still ALF but is amenable to SNF placement. Son was also amenable to SNF placement, with preference for [facility].   12:00pm: [facility] does not have SNF placements available, stated that [facility] has SNF beds.  CSW followed up with social worker at [employer] and left a message. CSW will follow up this afternoon if no call back.    1:57pm: [facility] has extended a bed offer in their SNF. CSW will inform patient and family.    CSW will continue to follow and provide support to patient and family.   Alexander Mt, [place] Work [phone]

## 2016-12-07 NOTE — Progress Notes (Signed)
Physical Therapy Treatment Patient Details Name: Vanessa [name] MRN: [id number] DOB: 10-30-1920 Today's Date: 12/07/2016    History of Present Illness Pt adm after fall and found to have afib with rvr. Pt also with nasal fx and multiple skin tears and bruises on face as well as lt forearm and hand bruising. PMH - afib, peripheral neuropathy, scoliosis, sciatica (getting shots), and vertigo    PT Comments    Pt son present at beginning of session. Pt motivated to participate in therapy this AM. Min A for safety during sit to stand transfer. VC for proper hand/foot placement, scoot hips forward and power up with LE. Once standing Min A to steady. Pt easily distracted throughout session. Once distracted pt has LOB due to decrease awareness to safety. Pt reported symptoms of dizziness but agreed to perform seated exercises. Current plan for SNF placement remains appropriate unless ALF can provide 24hr assistance due to pt frequent LOB when easily distracted from tasks and decrease awareness to safety Orthostatic VS for the past 24 hrs:  BP- Lying BP- Sitting BP- Standing at 0 minutes  12/07/16 1136 105/51 (!) 89/64 93/79      Follow Up Recommendations  SNF;Supervision/Assistance - 24 hour     Equipment Recommendations  None recommended by PT    Recommendations for Other Services       Precautions / Restrictions Precautions Precautions: Fall Restrictions Weight Bearing Restrictions: No    Mobility  Bed Mobility               General bed mobility comments: up in recliner  Transfers Overall transfer level: Needs assistance Equipment used: Rolling walker (2 wheeled) Transfers: Sit to/from Stand Sit to Stand: Min assist         General transfer comment: Pt required vc to scoot forward in recliner and power up when standing. Frequent VC for hand placement when ascending and descending into recliner and BSC.  Ambulation/Gait   Ambulation Distance (Feet): 4 Feet      Gait velocity: decr   General Gait Details: couple side steps in room from recliner >BSC>recliner. MIn assist for safety and balance. Pt because very unsteady once pt is distracted.    Stairs            Wheelchair Mobility    Modified Rankin (Stroke Patients Only)       Balance Overall balance assessment: Needs assistance Sitting-balance support: Feet supported;Bilateral upper extremity supported Sitting balance-Leahy Scale: Fair Sitting balance - Comments: Pt constantly trying to lean back. VC for erect posture when sitting in recliner to perfrom seated exercises.  Postural control: Posterior lean Standing balance support: During functional activity Standing balance-Leahy Scale: Poor Standing balance comment: LOB posterior x3 with Min A-Mod A for recovery during static standing to obtain orthostatic vitals.                            Cognition Arousal/Alertness: Awake/alert Behavior During Therapy: WFL for tasks assessed/performed Overall Cognitive Status: Impaired/Different from baseline Area of Impairment: Memory;Attention;Safety/judgement                     Memory: Decreased short-term memory   Safety/Judgement: Decreased awareness of safety     General Comments: Pt with decreased safety awareness and easily distracted during today's session      Exercises General Exercises - Lower Extremity Long Arc Quad: AROM;10 reps;Seated;Both Hip Flexion/Marching: AROM;10 reps;Both;Seated Toe Raises: AROM;Both;10 reps;Seated Heel Raises:  AROM;Both;10 reps;Seated Other Exercises Other Exercises: towel sqeeze 10 reps 3 sec holds Other Exercises: sit to stands 5 reps from recliner    General Comments        Pertinent Vitals/Pain Pain Assessment: No/denies pain(no pain currently but reports she sometimes has pain in her back rated 3/10 )    Home Living                      Prior Function            PT Goals (current goals can now  be found in the care plan section) Acute Rehab PT Goals Patient Stated Goal: not discussed Progress towards PT goals: Progressing toward goals    Frequency    Min 2X/week      PT Plan Current plan remains appropriate    Co-evaluation              AM-PAC PT "6 Clicks" Daily Activity  Outcome Measure  Difficulty turning over in bed (including adjusting bedclothes, sheets and blankets)?: Unable Difficulty moving from lying on back to sitting on the side of the bed? : Unable Difficulty sitting down on and standing up from a chair with arms (e.g., wheelchair, bedside commode, etc,.)?: Unable Help needed moving to and from a bed to chair (including a wheelchair)?: A Little Help needed walking in hospital room?: A Little Help needed climbing 3-5 steps with a railing? : A Little 6 Click Score: 12    End of Session Equipment Utilized During Treatment: Gait belt Activity Tolerance: Patient tolerated treatment well Patient left: in chair;with chair alarm set Nurse Communication: Mobility status PT Visit Diagnosis: Other abnormalities of gait and mobility (R26.89);Muscle weakness (generalized) (M62.81);History of falling (Z91.81)     Time: 3295-1884 PT Time Calculation (min) (ACUTE ONLY): 34 min  Charges:  $Therapeutic Exercise: 8-22 mins $Therapeutic Activity: 8-22 mins                    G Codes:  Functional Assessment Tool Used: AM-PAC 6 Clicks Basic Mobility    Fransisca Connors, SPTA    Fransisca Connors 12/07/2016, 2:24 PM

## 2016-12-07 NOTE — [place] FL2 (Signed)
[place] MEDICAID FL2 LEVEL OF CARE SCREENING TOOL     IDENTIFICATION  Patient Name: Vanessa [name] Birthdate: 08/31/1920 Sex: female Admission Date (Current Location): 12/01/2016  [facility] and [place] Number:  Herbalist and Address:  [facility] [place] [address], [place], [place] [address]      Provider Number: [id number]  Attending Physician Name and Address:  Allie Bossier, MD  Relative Name and Phone Number:   Shaunte Tuft [phone]    Current Level of Care: SNF Recommended Level of Care: [place] Prior Approval Number:    Date Approved/Denied:   PASRR Number:   [id number]  Discharge Plan: SNF    Current Diagnoses: Patient Active Problem List   Diagnosis Date Noted  . Atrial fibrillation with RVR ([place]) 12/02/2016  . Esophageal stenosis   . History of rib fracture 08/04/2016  . Age-related osteoporosis without current pathological fracture 08/04/2016  . Paresthesia 05/12/2016  . Abnormality of gait 04/19/2016  . Urinary frequency 12/09/2015  . OCD (obsessive compulsive disorder) 09/02/2015  . Chest pain 06/12/2015  . Hypothyroidism 04/15/2015  . Suprapubic pain 04/01/2015  . GERD (gastroesophageal reflux disease) 02/11/2015  . Constipation 02/11/2015  . Aspiration into respiratory tract 08/27/2014  . Cricopharyngeal hypertrophy 08/27/2014  . Muscle tension dysphonia 08/27/2014  . 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 Low back pain 08/13/2014  . Carotid stenosis 08/13/2014  . Hoarse 08/13/2014  . Xerostomia 06/18/2014  . Prickling sensation-Left Leg 06/05/2014  . Tingling sensation-Left Leg 06/05/2014  . Swelling of ankle 06/05/2014  . Hyperglycemia 04/02/2014  . Loss of weight 04/02/2014  . DOE (dyspnea on exertion) 04/02/2014  . Idiopathic scoliosis 04/02/2014  . Hearing loss 04/02/2014  . Dysphagia 04/02/2014  . Seborrheic keratoses, inflamed 04/02/2014  . Palliative care encounter  09/12/2013  . Weakness generalized 09/12/2013  . Anxiety state 09/12/2013  . GI bleed 09/10/2013  . Other and unspecified ovarian cyst 09/10/2013  . Fall 09/03/2013  . PVD (peripheral vascular disease) ([place]) 02/21/2013  . Occlusion of left internal carotid artery 08/16/2012  . Varicose veins 12/22/2011  . Malaise and fatigue 10/06/2011  . Raynaud phenomenon 06/26/2010  . Osteoarthritis 06/26/2010  . Atrial fibrillation ([place])   . Neuropathy   . Hypercholesterolemia   . Headache   . Pruritus     Orientation RESPIRATION BLADDER Height & Weight     Self, Time, Situation, Place  Normal Continent Weight: 88 lb (39.9 kg) Height:  5' (152.4 cm)  BEHAVIORAL SYMPTOMS/MOOD NEUROLOGICAL BOWEL NUTRITION STATUS      Continent Diet(please see discharge summary. )  AMBULATORY STATUS COMMUNICATION OF NEEDS Skin   Extensive Assist  Verbally Other (Comment)(laceration on the face. )                       Personal Care Assistance Level of Assistance  Bathing, Feeding, Dressing Bathing Assistance: Maximum assistance Feeding assistance: Limited assistance Dressing Assistance: Maximum assistance     Functional Limitations Info  Sight, Hearing, Speech Sight Info: Adequate Hearing Info: Adequate Speech Info: Adequate    SPECIAL CARE FACTORS FREQUENCY  PT (By licensed PT), OT (By licensed OT)     PT Frequency: 5 times a week  OT Frequency: 5 times a week             Contractures Contractures Info: Not present    Additional Factors Info  Code Status, Allergies Code Status Info: Full Allergies Info: NKA  Current Medications (12/07/2016):  This is the current hospital active medication list Current Facility-Administered Medications  Medication Dose Route Frequency Provider Last Rate Last Dose  . acetaminophen (TYLENOL) tablet 650 mg  650 mg Oral Q6H PRN Rise Patience, MD   650 mg at 12/05/16 1443   Or  . acetaminophen (TYLENOL) suppository 650 mg  650 mg  Rectal Q6H PRN Rise Patience, MD      . atenolol (TENORMIN) tablet 12.5 mg  12.5 mg Oral BID Joette Catching T, MD   12.5 mg at 12/07/16 0930  . chlorhexidine (PERIDEX) 0.12 % solution 15 mL  15 mL Mouth Rinse BID Rise Patience, MD   15 mL at 12/06/16 2135  . DULoxetine (CYMBALTA) DR capsule 20 mg  20 mg Oral QHS Rise Patience, MD   20 mg at 12/06/16 2135  . feeding supplement (ENSURE ENLIVE) (ENSURE ENLIVE) liquid 237 mL  237 mL Oral TID BM Allie Bossier, MD   237 mL at 12/07/16 0936  . MEDLINE mouth rinse  15 mL Mouth Rinse q12n4p Rise Patience, MD   15 mL at 12/06/16 1149  . multivitamin with minerals tablet   Oral Daily Cherene Altes, MD   1 tablet at 12/07/16 0930  . neomycin-bacitracin-polymyxin (NEOSPORIN) ointment   Topical TID Cherene Altes, MD   1 application at 15/40/08 0935  . ondansetron (ZOFRAN) tablet 4 mg  4 mg Oral Q6H PRN Rise Patience, MD       Or  . ondansetron [facility]) injection 4 mg  4 mg Intravenous Q6H PRN Rise Patience, MD      . oxyCODONE (Oxy IR/ROXICODONE) immediate release tablet 5 mg  5 mg Oral Q6H PRN Cherene Altes, MD      . polyethylene glycol (MIRALAX / GLYCOLAX) packet 17 g  17 g Oral Daily PRN Cherene Altes, MD   17 g at 12/06/16 1137  . simvastatin (ZOCOR) tablet 20 mg  20 mg Oral QHS Rise Patience, MD   20 mg at 12/06/16 2135  . sodium chloride (OCEAN) 0.65 % nasal spray 1 spray  1 spray Each Nare PRN Cherene Altes, MD   1 spray at 12/04/16 2219  . traMADol (ULTRAM) tablet 50 mg  50 mg Oral Q6H PRN Cherene Altes, MD         Discharge Medications: Please see discharge summary for a list of discharge medications.  Relevant Imaging Results:  Relevant Lab Results:   Additional Information SSN- [id number]  [place], [place]

## 2016-12-07 NOTE — Discharge Summary (Signed)
Physician Discharge Summary  Vanessa [name] JME:[id number] DOB: September 12, 1920 DOA: 12/01/2016  PCP: Vanessa Serve, MD  Admit date: 12/01/2016 Discharge date: 12/07/2016  Time spent: 35 minutes  Recommendations for Outpatient Follow-up:  Chronic A. Fib/acute RVR(CHA2DS2/VASc =6) -no anticoagulation secondary to high fall risk and GI bleed. -rate controlled  Hypotension -resolved after reducing beta blocker and fluid resuscitation  S/P fall with injury -Minimally displaced nasal bone fracture -Nonoperative fracture -Patient to be discharged to SNF  Desaturation with Exertion -Resolved   Hypothyroidism -TSH at goal  HLD -Continue statin  Severe Protein calorie malnutrition -Ensure TID    HLD Cont statin      Discharge Diagnoses:  Principal Problem:   Atrial fibrillation with RVR ([place]) Active Problems:   Fall   Hypothyroidism   Discharge Condition:stable  Diet recommendation: dysphagia 1 fluid consistency thin  Filed Weights   12/01/16 1859 12/03/16 0127 12/06/16 0356  Weight: 92 lb (41.7 kg) 92 lb 1.6 oz (41.8 kg) 88 lb (39.9 kg)    History of present illness:  [name], HLD, peripheral neuropathy, scoliosis, LEFT Carotid Artery occlusion,   Brought to the ER after a fall.  Patient was walking in the yard when she tripped and fell and hit her face.  Did not lose consciousness.  In the ER patient was found to be in A. fib with RVR.  Patient had multiple ecchymotic areas on the face.  Sutures were placed in the forehead and the nose.  CT of the head neck maxillofacial and left knee showed only a minimally displaced nasal bone fracture.  X-ray of the left forearm showed no fracture.      Discharge Exam: Vitals:   12/06/16 2339 12/07/16 0523 12/07/16 0933 12/07/16 1255  BP: (!) 131/95 (!) 155/85 127/81 105/90  Pulse: 93 (!) 115 94 79  Resp: 20 18 18 18   Temp: 98.3 F (36.8 C) 98.5 F (36.9 C) 98.6 F (37 C) 98.7 F (37.1 C)  TempSrc:  Oral Oral Oral Oral  SpO2: 94% 93% 92% 97%  Weight:      Height:        General: A/O 4, No acute respiratory distress Cardiovascular: Irregular irregular rhythm and rate, negative murmurs rubs or gallops, normal S1/S2 Respiratory: clear to auscultation bilateral  Discharge Instructions   Allergies as of 12/07/2016   No Known Allergies     Medication List    STOP taking these medications   triamcinolone cream 0.1 % Commonly known as:  KENALOG     TAKE these medications   acetaminophen 500 MG tablet Commonly known as:  TYLENOL Take 500 mg every 6 (six) hours as needed by mouth (for pain/back pain.).   ALBOLENE EX Apply 1 application at bedtime topically.   antiseptic oral rinse Liqd 15 mLs by Mouth Rinse route as needed for dry mouth.   aspirin EC 81 MG tablet Take 81 mg daily by mouth.   atenolol 25 MG tablet Commonly known as:  TENORMIN Take 0.5 tablets (12.5 mg total) by mouth 2 (two) times daily. What changed:  how much to take   Biotin 5000 MCG Caps Take 5,000 mcg daily by mouth.   DULoxetine 20 MG capsule Commonly known as:  CYMBALTA TAKE 1 CAPSULE AT BEDTIME.   feeding supplement (ENSURE ENLIVE) Liqd Take 237 mLs by mouth 3 (three) times daily between meals.   multivitamin-iron-minerals-folic acid chewable tablet Chew 1 tablet daily by mouth.   neomycin-bacitracin-polymyxin Oint Commonly known as:  NEOSPORIN  Apply 1 application topically 3 (three) times daily.   ondansetron 4 MG tablet Commonly known as:  ZOFRAN Take 1 tablet (4 mg total) by mouth every 6 (six) hours as needed for nausea.   oxyCODONE 5 MG immediate release tablet Commonly known as:  Oxy IR/ROXICODONE Take 1 tablet (5 mg total) by mouth every 6 (six) hours as needed for severe pain.   polyethylene glycol packet Commonly known as:  MIRALAX / GLYCOLAX Take 17 g daily as needed by mouth (for constipation).   simvastatin 40 MG tablet Commonly known as:  ZOCOR Take 20 mg at  bedtime by mouth.   traMADol 50 MG tablet Commonly known as:  ULTRAM Take 1 tablet (50 mg total) by mouth every 6 (six) hours as needed for moderate pain.   VIACTIV [phone] MG-UNT-MCG Chew Generic drug:  Calcium-Vitamin D-Vitamin K Chew 1 tablet daily by mouth.      No Known Allergies Contact information for after-discharge care    Destination    [facility] .   Service:  Skilled Nursing Contact information: [address] [phone]               The results of significant diagnostics from this hospitalization (including imaging, microbiology, ancillary and laboratory) are listed below for reference.    Significant Diagnostic Studies: Dg Chest 2 View  Result Date: 12/01/2016 CLINICAL DATA:  Fall in parking lot today. EXAM: CHEST  2 VIEW COMPARISON:  11/16/2013 FINDINGS: The heart size and mediastinal contours are within normal limits. Aortic atherosclerosis. Changes of COPD again noted. Both lungs are clear. No evidence of pneumothorax or pleural effusion. Several old left rib fracture deformities are again noted. Lumbar spine fusion hardware and scoliosis again noted. IMPRESSION: COPD.  No active cardiopulmonary disease. Electronically Signed   By: Earle Gell M.D.   On: 12/01/2016 20:54   Dg Forearm Left  Result Date: 12/01/2016 CLINICAL DATA:  Fall in parking lot today. Left forearm pain and bruising. Initial encounter. EXAM: LEFT FOREARM - 2 VIEW COMPARISON:  None. FINDINGS: There is no evidence of fracture or other focal bone lesions. Generalized osteopenia noted. Moderate soft tissue swelling is seen involving the mid distal forearm. IMPRESSION: Moderate soft tissue swelling. No evidence of fracture or dislocation. Electronically Signed   By: Earle Gell M.D.   On: 12/01/2016 21:00   Ct Head Wo Contrast  Result Date: 12/01/2016 CLINICAL DATA:  Initial evaluation for acute trauma, fall. EXAM: CT HEAD  WITHOUT CONTRAST CT MAXILLOFACIAL WITHOUT CONTRAST CT CERVICAL SPINE WITHOUT CONTRAST TECHNIQUE: Multidetector CT imaging of the head, cervical spine, and maxillofacial structures were performed using the standard protocol without intravenous contrast. Multiplanar CT image reconstructions of the cervical spine and maxillofacial structures were also generated. COMPARISON:  Prior CT from 01/23/2016. FINDINGS: CT HEAD FINDINGS Brain: Generalized age-related cerebral atrophy with moderate chronic small vessel ischemic disease. No acute intracranial hemorrhage. No acute large vessel territory infarct. No mass lesion, midline shift or mass effect. No hydrocephalus. No extra-axial fluid collection. Vascular: No hyperdense vessel. Scattered vascular calcifications noted within the carotid siphons. Skull: Large soft tissue contusion present at the forehead. Calvarium intact. Other: Mastoid air cells are clear. CT MAXILLOFACIAL FINDINGS Osseous: Zygomatic arches intact. No acute maxillary fracture. Pterygoid plates intact. There are acute minimally displaced bilateral nasal bone fractures. Nasal septum mildly bowed to the right but intact. Mandible intact. Mandibular condyles normally situated. No acute abnormality about the dentition. Few scattered dental  caries noted. Orbits: Globes and orbital soft tissues within normal limits. Bony orbits intact. Sinuses: Scattered mucoperiosteal thickening present within the ethmoidal air cells. Paranasal sinuses are otherwise clear. No hemosinus. Soft tissues: Large soft tissue contusion at the forehead. Mild soft tissue swelling about the nose. CT CERVICAL SPINE FINDINGS Alignment: Straightening of the normal cervical lordosis. Grade 1 anterolisthesis of C3, C4 on C5, C7 on T1, T1 on T2, and T2 on T3. Trace retrolisthesis of C5 on C6. Findings are likely chronic and degenerative. Skull base and vertebrae: Skullbase intact. Normal C1-2 articulations are preserved in the dens is intact.  Vertebral body heights maintained. No acute fracture. Soft tissues and spinal canal: No acute soft tissue abnormality within the neck. No abnormal prevertebral edema. Vascular calcifications present about the carotid bifurcations. Spinal canal within normal limits. Disc levels: Advanced degenerative spondylolysis at C5-6 and C6-7. Multilevel facet arthrosis with fusion and ankylosis, most notable within the upper cervical spine. Pronounced degenerative thickening of the tectorial membrane noted. Upper chest: Visualized upper chest within normal limits. Irregular biapical pleuroparenchymal thickening noted. Partially visualized lungs are otherwise grossly clear. Other: None. IMPRESSION: CT HEAD: 1. No acute intracranial process. 2. Large soft tissue contusion at the forehead. No calvarial fracture. 3. Age related cerebral atrophy with moderate chronic small vessel ischemic disease. CT MAXILLOFACIAL: 1. Acute minimally displaced bilateral nasal bone fractures. 2. No other acute maxillofacial injury. CT CERVICAL SPINE: 1. No acute traumatic injury within the cervical spine. 2. Advanced multilevel degenerative spondylolysis, most notable at C5-6 and C6-7. Electronically Signed   By: Jeannine Boga M.D.   On: 12/01/2016 20:47   Ct Cervical Spine Wo Contrast  Result Date: 12/01/2016 CLINICAL DATA:  Initial evaluation for acute trauma, fall. EXAM: CT HEAD WITHOUT CONTRAST CT MAXILLOFACIAL WITHOUT CONTRAST CT CERVICAL SPINE WITHOUT CONTRAST TECHNIQUE: Multidetector CT imaging of the head, cervical spine, and maxillofacial structures were performed using the standard protocol without intravenous contrast. Multiplanar CT image reconstructions of the cervical spine and maxillofacial structures were also generated. COMPARISON:  Prior CT from 01/23/2016. FINDINGS: CT HEAD FINDINGS Brain: Generalized age-related cerebral atrophy with moderate chronic small vessel ischemic disease. No acute intracranial hemorrhage. No  acute large vessel territory infarct. No mass lesion, midline shift or mass effect. No hydrocephalus. No extra-axial fluid collection. Vascular: No hyperdense vessel. Scattered vascular calcifications noted within the carotid siphons. Skull: Large soft tissue contusion present at the forehead. Calvarium intact. Other: Mastoid air cells are clear. CT MAXILLOFACIAL FINDINGS Osseous: Zygomatic arches intact. No acute maxillary fracture. Pterygoid plates intact. There are acute minimally displaced bilateral nasal bone fractures. Nasal septum mildly bowed to the right but intact. Mandible intact. Mandibular condyles normally situated. No acute abnormality about the dentition. Few scattered dental caries noted. Orbits: Globes and orbital soft tissues within normal limits. Bony orbits intact. Sinuses: Scattered mucoperiosteal thickening present within the ethmoidal air cells. Paranasal sinuses are otherwise clear. No hemosinus. Soft tissues: Large soft tissue contusion at the forehead. Mild soft tissue swelling about the nose. CT CERVICAL SPINE FINDINGS Alignment: Straightening of the normal cervical lordosis. Grade 1 anterolisthesis of C3, C4 on C5, C7 on T1, T1 on T2, and T2 on T3. Trace retrolisthesis of C5 on C6. Findings are likely chronic and degenerative. Skull base and vertebrae: Skullbase intact. Normal C1-2 articulations are preserved in the dens is intact. Vertebral body heights maintained. No acute fracture. Soft tissues and spinal canal: No acute soft tissue abnormality within the neck. No abnormal prevertebral edema. Vascular calcifications  present about the carotid bifurcations. Spinal canal within normal limits. Disc levels: Advanced degenerative spondylolysis at C5-6 and C6-7. Multilevel facet arthrosis with fusion and ankylosis, most notable within the upper cervical spine. Pronounced degenerative thickening of the tectorial membrane noted. Upper chest: Visualized upper chest within normal limits.  Irregular biapical pleuroparenchymal thickening noted. Partially visualized lungs are otherwise grossly clear. Other: None. IMPRESSION: CT HEAD: 1. No acute intracranial process. 2. Large soft tissue contusion at the forehead. No calvarial fracture. 3. Age related cerebral atrophy with moderate chronic small vessel ischemic disease. CT MAXILLOFACIAL: 1. Acute minimally displaced bilateral nasal bone fractures. 2. No other acute maxillofacial injury. CT CERVICAL SPINE: 1. No acute traumatic injury within the cervical spine. 2. Advanced multilevel degenerative spondylolysis, most notable at C5-6 and C6-7. Electronically Signed   By: Jeannine Boga M.D.   On: 12/01/2016 20:47   Ct Knee Left Wo Contrast  Result Date: 12/02/2016 CLINICAL DATA:  Left knee pain after fall onto concrete wall walking. Knee trauma, tenderness or a fusion or can walk, initial exam. EXAM: CT OF THE LEFT KNEE WITHOUT CONTRAST TECHNIQUE: Multidetector CT imaging of the LEFT knee was performed according to the standard protocol. Multiplanar CT image reconstructions were also generated. COMPARISON:  Radiographs yesterday FINDINGS: Bones/Joint/Cartilage No fractures. The bones are osteoporotic. Age related tibial femoral joint space narrowing. No joint effusion. Ligaments Suboptimally assessed by CT. Muscles and Tendons No large intramuscular hematoma. Quadriceps and patellar tendons are intact. Soft tissues Soft tissue edema medially. Vascular calcifications incidentally noted. IMPRESSION: No acute fracture, no joint effusion. Bony under mineralization. Electronically Signed   By: Jeb Levering M.D.   On: 12/02/2016 02:09   Dg Esophagus  Result Date: 11/11/2016 CLINICAL DATA:  Unspecified dysphagia. EXAM: ESOPHOGRAM/BARIUM SWALLOW TECHNIQUE: Single contrast examination was performed using  thin barium. FLUOROSCOPY TIME:  Fluoroscopy Time:  1.3 minutes Radiation Exposure Index (if provided by the fluoroscopic device): 2.6 mGy  Number of Acquired Spot Images: 0 COMPARISON:  08/09/2014 FINDINGS: The patient was too unsteady on the platform to be imaged standing. She was placed semi recumbent as tolerated by back pain. The esophagus had normal distensibility and smooth mucosal contour. The GE junction had a tapered appearance but normally opened and a stricture is considered unlikely. Unfortunately, a tablet could not be provided to the patient based on previous events. She reports that she no longer takes pills. Motility was normal for age. No hiatal hernia or reflux was seen. After oblique imaging the patient turned to her right and the pharynx was evaluated laterally. This showed a persistent lack of cricopharyngeal relaxation with focal high-grade luminal narrowing. Unfortunately, this clip did not save due to machine error. There was no associated diverticulum, aspiration, or significant pharyngeal stasis. IMPRESSION: 1. Persistent abnormal relaxation of the cricopharyngeus with high-grade narrowing at the thoracic inlet. No associated diverticulum, aspiration, or stasis. 2. Otherwise negative study. Electronically Signed   By: Monte Fantasia M.D.   On: 11/11/2016 12:44   Dg Pelvis Portable  Result Date: 12/02/2016 CLINICAL DATA:  Pain following fall EXAM: PORTABLE PELVIS 1-2 VIEWS COMPARISON:  None. FINDINGS: There is no evidence of pelvic fracture or dislocation. Joint spaces appear unremarkable. No erosive change. There is postoperative change in the lower lumbar and upper sacral regions. IMPRESSION: No fracture or dislocation. No appreciable arthropathy. Postoperative change in the lumbosacral region. Electronically Signed   By: Lowella Grip III M.D.   On: 12/02/2016 09:14   Dg Chest [facility] 1 View  Result  Date: 12/06/2016 CLINICAL DATA:  Oxygen desaturation, history GERD, atrial fibrillation EXAM: PORTABLE CHEST 1 VIEW COMPARISON:  Portable exam 0912 hours compared to 12/01/2016 FINDINGS: Normal heart size, mediastinal  contours, and pulmonary vascularity. Atherosclerotic calcification aorta. Emphysematous and bronchitic changes consistent with COPD. Bibasilar effusions and atelectasis. No definite infiltrate or pneumothorax. Diffuse osseous demineralization with thoracolumbar scoliosis. Old fractures of the LEFT eighth and ninth ribs. IMPRESSION: COPD changes with small bibasilar pleural effusions and basilar atelectasis. Electronically Signed   By: Lavonia Dana M.D.   On: 12/06/2016 09:41   Dg Knee Complete 4 Views Left  Result Date: 12/01/2016 CLINICAL DATA:  Followup parking lot today. Knee pain and bruising. Initial encounter. EXAM: LEFT KNEE - COMPLETE 4+ VIEW COMPARISON:  None. FINDINGS: No evidence of fracture, dislocation, or joint effusion. No evidence of arthropathy or other focal bone abnormality. Generalized osteopenia noted. Mild peripheral vascular calcification noted. IMPRESSION: No acute findings.  Osteopenia. Electronically Signed   By: Earle Gell M.D.   On: 12/01/2016 20:59   Ct Maxillofacial Wo Cm  Result Date: 12/01/2016 CLINICAL DATA:  Initial evaluation for acute trauma, fall. EXAM: CT HEAD WITHOUT CONTRAST CT MAXILLOFACIAL WITHOUT CONTRAST CT CERVICAL SPINE WITHOUT CONTRAST TECHNIQUE: Multidetector CT imaging of the head, cervical spine, and maxillofacial structures were performed using the standard protocol without intravenous contrast. Multiplanar CT image reconstructions of the cervical spine and maxillofacial structures were also generated. COMPARISON:  Prior CT from 01/23/2016. FINDINGS: CT HEAD FINDINGS Brain: Generalized age-related cerebral atrophy with moderate chronic small vessel ischemic disease. No acute intracranial hemorrhage. No acute large vessel territory infarct. No mass lesion, midline shift or mass effect. No hydrocephalus. No extra-axial fluid collection. Vascular: No hyperdense vessel. Scattered vascular calcifications noted within the carotid siphons. Skull: Large soft  tissue contusion present at the forehead. Calvarium intact. Other: Mastoid air cells are clear. CT MAXILLOFACIAL FINDINGS Osseous: Zygomatic arches intact. No acute maxillary fracture. Pterygoid plates intact. There are acute minimally displaced bilateral nasal bone fractures. Nasal septum mildly bowed to the right but intact. Mandible intact. Mandibular condyles normally situated. No acute abnormality about the dentition. Few scattered dental caries noted. Orbits: Globes and orbital soft tissues within normal limits. Bony orbits intact. Sinuses: Scattered mucoperiosteal thickening present within the ethmoidal air cells. Paranasal sinuses are otherwise clear. No hemosinus. Soft tissues: Large soft tissue contusion at the forehead. Mild soft tissue swelling about the nose. CT CERVICAL SPINE FINDINGS Alignment: Straightening of the normal cervical lordosis. Grade 1 anterolisthesis of C3, C4 on C5, C7 on T1, T1 on T2, and T2 on T3. Trace retrolisthesis of C5 on C6. Findings are likely chronic and degenerative. Skull base and vertebrae: Skullbase intact. Normal C1-2 articulations are preserved in the dens is intact. Vertebral body heights maintained. No acute fracture. Soft tissues and spinal canal: No acute soft tissue abnormality within the neck. No abnormal prevertebral edema. Vascular calcifications present about the carotid bifurcations. Spinal canal within normal limits. Disc levels: Advanced degenerative spondylolysis at C5-6 and C6-7. Multilevel facet arthrosis with fusion and ankylosis, most notable within the upper cervical spine. Pronounced degenerative thickening of the tectorial membrane noted. Upper chest: Visualized upper chest within normal limits. Irregular biapical pleuroparenchymal thickening noted. Partially visualized lungs are otherwise grossly clear. Other: None. IMPRESSION: CT HEAD: 1. No acute intracranial process. 2. Large soft tissue contusion at the forehead. No calvarial fracture. 3. Age  related cerebral atrophy with moderate chronic small vessel ischemic disease. CT MAXILLOFACIAL: 1. Acute minimally displaced bilateral nasal bone  fractures. 2. No other acute maxillofacial injury. CT CERVICAL SPINE: 1. No acute traumatic injury within the cervical spine. 2. Advanced multilevel degenerative spondylolysis, most notable at C5-6 and C6-7. Electronically Signed   By: Jeannine Boga M.D.   On: 12/01/2016 20:47    Microbiology: Recent Results (from the past 240 hour(s))  Urine culture     Status: Abnormal   Collection Time: 12/01/16  9:14 PM  Result Value Ref Range Status   Specimen Description URINE, RANDOM  Final   Special Requests NONE  Final   Culture MULTIPLE SPECIES PRESENT, SUGGEST RECOLLECTION (A)  Final   Report Status 12/03/2016 FINAL  Final  MRSA PCR Screening     Status: None   Collection Time: 12/02/16  3:15 AM  Result Value Ref Range Status   MRSA by PCR NEGATIVE NEGATIVE Final    Comment:        The GeneXpert MRSA Assay (FDA approved for NASAL specimens only), is one component of a comprehensive MRSA colonization surveillance program. It is not intended to diagnose MRSA infection nor to guide or monitor treatment for MRSA infections.      Labs: Basic Metabolic Panel: Recent Labs  Lab 12/01/16 1904 12/02/16 0511 12/04/16 0220 12/07/16 0750  NA 137 138 135 135  K 4.6 4.2 4.2 3.9  CL 100* 103 104 102  CO2 29 28 25 26   GLUCOSE 108* 110* 89 100*  BUN 17 15 10 13   CREATININE 0.77 0.72 0.65 0.72  CALCIUM 9.4 8.7* 8.5* 8.9  MG  --  1.9  --  2.1   Liver Function Tests: Recent Labs  Lab 12/04/16 0220  AST 24  ALT 13*  ALKPHOS 75  BILITOT 0.6  PROT 5.5*  ALBUMIN 2.7*   No results for input(s): LIPASE, AMYLASE in the last 168 hours. No results for input(s): AMMONIA in the last 168 hours. CBC: Recent Labs  Lab 12/01/16 1904 12/02/16 0511 12/04/16 0220  WBC 8.4 11.1* 9.9  NEUTROABS 6.3 8.0*  --   HGB 13.8 11.5* 11.6*  HCT 42.0  35.1* 34.6*  MCV 98.4 98.3 98.0  PLT 221 199 213   Cardiac Enzymes: Recent Labs  Lab 12/02/16 0511  TROPONINI 0.04*   BNP: BNP (last 3 results) No results for input(s): BNP in the last 8760 hours.  ProBNP (last 3 results) No results for input(s): PROBNP in the last 8760 hours.  CBG: No results for input(s): GLUCAP in the last 168 hours.     Signed:  Dia Crawford, MD [facility] [phone] pager

## 2016-12-07 NOTE — Progress Notes (Signed)
Nutrition Follow-up  DOCUMENTATION CODES:   Non-severe (moderate) malnutrition in context of chronic illness, Underweight  INTERVENTION:   -Continue Ensure Enlive po TID, each supplement provides 350 kcal and 20 grams of protein -Continue MVI daily  NUTRITION DIAGNOSIS:   Moderate Malnutrition related to chronic illness(esophageal stricture) as evidenced by energy intake < 75% for > or equal to 1 month, mild fat depletion, moderate fat depletion, mild muscle depletion, moderate muscle depletion.  Ongoing  GOAL:   Patient will meet greater than or equal to 90% of their needs  Progressing  MONITOR:   PO intake, Supplement acceptance, Labs, Weight trends, Skin, I & O's  REASON FOR ASSESSMENT:   Malnutrition Screening Tool    ASSESSMENT:   Vanessa [name] is a 83 y.o. female with 2 atrial fibrillation hyperlipidemia complete occlusion of carotid artery was brought to the ER after patient had a fall.  Pt in with CSW at time of visit.   Case discussed with charge nurse; pt's appetite has improved (meal completion 25-75%). Noted pt consumed about 75% of hot cereal and 50% of of food off of breakfast tray. Pt is accepting Ensure supplements well (prefers chocolate flavor). Per RN, likely plan to return to [facility] soon (pt refusing SNF placement).   Labs reviewed.   Diet Order:  DIET - DYS 1 Room service appropriate? Yes; Fluid consistency: Thin  EDUCATION NEEDS:   Education needs have been addressed  Skin:  Skin Assessment: Skin Integrity Issues: Skin Integrity Issues:: Other (Comment) Other: facial lacerations  Last BM:  12/06/16  Height:   Ht Readings from Last 1 Encounters:  12/01/16 5' (1.524 m)    Weight:   Wt Readings from Last 1 Encounters:  12/06/16 88 lb (39.9 kg)    Ideal Body Weight:  45.5 kg  BMI:  Body mass index is 17.19 kg/m.  Estimated Nutritional Needs:   Kcal:  1100-1300  Protein:  50-65 grams  Fluid:  > 1.1  L    [name] A. Jimmye Norman, RD, LDN, CDE Pager: [phone] After hours Pager: [phone]

## 2016-12-08 ENCOUNTER — Telehealth: Payer: Self-pay

## 2016-12-08 NOTE — Telephone Encounter (Signed)
Possible re-admission to facility. This is a patient you were seeing at [facility] F/U is needed if patient was re-admitted to facility upon discharge. Hospital discharge from [facility] on  12/07/2016

## 2016-12-09 ENCOUNTER — Non-Acute Institutional Stay (SKILLED_NURSING_FACILITY): Payer: Medicare Other | Admitting: Internal Medicine

## 2016-12-09 ENCOUNTER — Encounter: Payer: Self-pay | Admitting: Internal Medicine

## 2016-12-09 DIAGNOSIS — R0789 Other chest pain: Secondary | ICD-10-CM | POA: Diagnosis not present

## 2016-12-09 DIAGNOSIS — R1314 Dysphagia, pharyngoesophageal phase: Secondary | ICD-10-CM | POA: Diagnosis not present

## 2016-12-09 DIAGNOSIS — E43 Unspecified severe protein-calorie malnutrition: Secondary | ICD-10-CM | POA: Diagnosis not present

## 2016-12-09 DIAGNOSIS — S0181XD Laceration without foreign body of other part of head, subsequent encounter: Secondary | ICD-10-CM

## 2016-12-09 DIAGNOSIS — I482 Chronic atrial fibrillation, unspecified: Secondary | ICD-10-CM

## 2016-12-09 DIAGNOSIS — R531 Weakness: Secondary | ICD-10-CM | POA: Diagnosis not present

## 2016-12-09 DIAGNOSIS — R2681 Unsteadiness on feet: Secondary | ICD-10-CM

## 2016-12-09 DIAGNOSIS — D649 Anemia, unspecified: Secondary | ICD-10-CM | POA: Diagnosis not present

## 2016-12-09 DIAGNOSIS — E7800 Pure hypercholesterolemia, unspecified: Secondary | ICD-10-CM

## 2016-12-09 DIAGNOSIS — S022XXD Fracture of nasal bones, subsequent encounter for fracture with routine healing: Secondary | ICD-10-CM | POA: Diagnosis not present

## 2016-12-09 DIAGNOSIS — G629 Polyneuropathy, unspecified: Secondary | ICD-10-CM

## 2016-12-09 NOTE — Progress Notes (Signed)
Provider:  Blanchie Serve MD  Location:  [place] Room Number: 40-A Place of Service:  SNF (31)  PCP: Blanchie Serve, MD Patient Care Team: Blanchie Serve, MD as PCP - General (Internal Medicine) Angelia Mould, MD as Attending Physician (Vascular Surgery) [name], MD as Attending Physician (Obstetrics and Gynecology) [name], MD (Orthopedic Surgery) [name], MD (Ophthalmology) [name], MD as Consulting Physician (Urology) [name], MD as Consulting Physician (Cardiology) [name], PA-C as Physician Assistant (Otolaryngology) [name], MD as Consulting Physician (Otolaryngology) [name], MD as Consulting Physician (Dermatology) [name], MD as Consulting Physician (Physical Medicine and Rehabilitation) [name], MD as Attending Physician (Gastroenterology) Mast, Man X, NP as Nurse Practitioner (Internal Medicine) Kathrynn Ducking, MD as Consulting Physician (Neurology) Debara Pickett Nadean Corwin, MD as Consulting Physician (Cardiology) Druscilla Brownie, MD as Consulting Physician (Dermatology)  Extended Emergency Contact Information Primary Emergency Contact: [name] of [place] Phone: [phone] Mobile Phone: [phone] Relation: Son   Goals of Care: Advanced Directive information Advanced Directives 12/02/2016  Does Patient Have a Medical Advance Directive? Yes  Type of Advance Directive [place]  Does patient want to make changes to medical advance directive? No - Patient declined  Copy of [place] in Chart? No - copy requested  Would patient like information on creating a medical advance directive? -     Chief Complaint  Patient presents with  . New Admit To SNF    HPI: Patient is a 83 y.o. female seen today for admission visit. She was in the hospital from 12/01/16-12/07/16 post fall. She was found to be  in afib with RVR and had sustained bruising and laceration her her face. Sutures were applied and imaging showed minimally displaced nasal bone fracture. Medical management was recommended for this. Her HR stayed controlled post iv fluids and metoprolol adjustment. Her hypotension responded well to iv fluids. She has medical history of afib, HLD, dysphagia among others. She is seen in her room today. Prior to this hospitalization, she was residing in assisted living facility.   Past Medical History:  Diagnosis Date  . Abdominal bloating   . Atrial fibrillation ([place])   . Bruises easily   . Carotid artery occlusion    LEFT  . Dizziness   . DOE (dyspnea on exertion) 04/02/2014  . Fall at home Sept 2013, Dec. 2013  Jun 08, 2012  . GERD (gastroesophageal reflux disease) 02/11/2015  . Headache(784.0)   . Hoarseness   . Hypercholesterolemia   . Hyperglycemia 04/02/2014   Glucose 178 mg percent on 01/22/2014 04/05/14 Hgb A1c 6.6 Diet controlled.    . Hypothyroidism 04/15/2015  . Neuropathy    PERIPHERAL  . Pruritus   . Scoliosis   . Varicose veins    Past Surgical History:  Procedure Laterality Date  . ABDOMINAL HYSTERECTOMY  1954  . CHOLECYSTECTOMY  1997  . ESOPHAGOGASTRODUODENOSCOPY (EGD) WITH PROPOFOL N/A 11/25/2016   Procedure: ESOPHAGOGASTRODUODENOSCOPY (EGD) WITH PROPOFOL;  Surgeon: [name], MD;  Location: [facility];  Service: Endoscopy;  Laterality: N/A;  . [place]   correct scoliosis    reports that  has never smoked. she has never used smokeless tobacco. She reports that she does not drink alcohol or use drugs. Social History   Socioeconomic History  . Marital status: Widowed    Spouse name: Not on file  . Number of children: 2  . Years  of education: 12+  . Highest education level: Not on file  Social Needs  . Financial resource strain: Not on file  . Food insecurity - worry: Not on file  . Food insecurity - inability: Not on file  . Transportation  needs - medical: Not on file  . Transportation needs - non-medical: Not on file  Occupational History  . 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 Smoking status: Never Smoker  . Smokeless tobacco: Never Used  Substance and Sexual Activity  . Alcohol use: No    Alcohol/week: 0.0 oz  . Drug use: No  . Sexual activity: No    Birth control/protection: None  Other Topics Concern  . Not on file  Social History Narrative   Patient lives at [facility]/05/[facility]   Patient is a widowed.    Patient is retired.    Patient has no children but adopted twin sons.    Patient has a college degree.    Never smoked   Alcohol none   Exercise with therapy   Walks with cane   POA       Patient only drinks de-caf drinks.   Patient is right handed.    Functional Status Survey:    Family History  Adopted: Yes  Problem Relation Age of Onset  . Diabetes Mother   . Heart attack Mother   . Heart disease Mother        After age 70  . Heart attack Father   . Stroke Father   . Breast cancer Sister   . Heart disease Maternal Grandmother   . Cancer Son        adopted son. esophagus, stomach, liver  . Stomach cancer Neg Hx   . Colon cancer Neg Hx     Health Maintenance  Topic Date Due  . DEXA SCAN  01/11/2017 (Originally 01/31/1987)  . PNA vac Low Risk Adult (2 of 2 - PPSV23) 01/11/2017 (Originally 08/22/2014)  . TETANUS/TDAP  05/21/2018  . INFLUENZA VACCINE  Completed    No Known Allergies  Outpatient Encounter Medications as of 12/09/2016  Medication Sig  . acetaminophen (TYLENOL) 500 MG tablet Take 500 mg every 6 (six) hours as needed by mouth (for pain/back pain.).   Marland Kitchen antiseptic oral rinse (BIOTENE) LIQD 15 mLs by Mouth Rinse route as needed for dry mouth.  Marland Kitchen aspirin EC 81 MG tablet Take 81 mg daily by mouth.  Marland Kitchen atenolol (TENORMIN) 25 MG tablet Take 0.5 tablets (12.5 mg total) by mouth 2 (two) times daily.  . Biotin  5000 MCG CAPS Take 5,000 mcg daily by mouth.   . Calcium-Vitamin D-Vitamin K (VIACTIV) [id number] MG-UNT-MCG CHEW Chew 1 tablet daily by mouth.  . DULoxetine (CYMBALTA) 20 MG capsule TAKE 1 CAPSULE AT BEDTIME.  . multivitamin-iron-minerals-folic acid (CENTRUM) chewable tablet Chew 1 tablet daily by mouth.  . neomycin-bacitracin-polymyxin (NEOSPORIN) OINT Apply 1 application topically 3 (three) times daily.  . ondansetron (ZOFRAN) 4 MG tablet Take 1 tablet (4 mg total) by mouth every 6 (six) hours as needed for nausea.  Marland Kitchen oxyCODONE (OXY IR/ROXICODONE) 5 MG immediate release tablet Take 1 tablet (5 mg total) by mouth every 6 (six) hours as needed for severe pain.  . polyethylene glycol (MIRALAX / GLYCOLAX) packet Take 17 g daily as needed by mouth (for constipation).   . simvastatin (ZOCOR) 40 MG tablet Take 20 mg at bedtime by mouth.   . traMADol (ULTRAM) 50 MG  tablet Take 1 tablet (50 mg total) by mouth every 6 (six) hours as needed for moderate pain.  . [DISCONTINUED] Emollient (ALBOLENE EX) Apply 1 application at bedtime topically.  . [DISCONTINUED] feeding supplement, ENSURE ENLIVE, (ENSURE ENLIVE) LIQD Take 237 mLs by mouth 3 (three) times daily between meals.   No facility-administered encounter medications on file as of 12/09/2016.     Review of Systems  Constitutional: Positive for fatigue. Negative for appetite change, chills and fever.  HENT: Positive for congestion, hearing loss and trouble swallowing. Negative for ear pain, mouth sores, nosebleeds, postnasal drip, rhinorrhea and sore throat.   Eyes: Positive for visual disturbance.  Respiratory: Negative for cough, chest tightness, shortness of breath and wheezing.   Cardiovascular: Positive for chest pain. Negative for palpitations and leg swelling.  Gastrointestinal: Negative for abdominal pain, blood in stool, constipation, diarrhea, nausea and vomiting.  Genitourinary: Negative for dysuria and hematuria.  Musculoskeletal:  Positive for arthralgias and gait problem. Negative for back pain.  Skin: Negative for rash and wound.  Neurological: Positive for weakness. Negative for dizziness, numbness and headaches.  Hematological: Bruises/bleeds easily.  Psychiatric/Behavioral: Negative for behavioral problems and confusion.    Vitals:   12/09/16 1251  BP: 120/68  Pulse: 78  Resp: 16  Temp: (!) 96.8 F (36 C)  Weight: 91 lb 6.4 oz (41.5 kg)  Height: 5\' 2"  (1.575 m)   Body mass index is 16.72 kg/m.    Wt Readings from Last 3 Encounters:  12/09/16 91 lb 6.4 oz (41.5 kg)  12/06/16 88 lb (39.9 kg)  11/29/16 92 lb 2 oz (41.8 kg)   Physical Exam  Constitutional: She is oriented to person, place, and time.  Thin built, frail, in no acute distress  HENT:  Head: Normocephalic and atraumatic.  Mouth/Throat: Oropharynx is clear and moist. No oropharyngeal exudate.  Eyes: Conjunctivae and EOM are normal. Pupils are equal, round, and reactive to light. Right eye exhibits no discharge. Left eye exhibits no discharge.  Neck: Neck supple.  Cardiovascular:  No murmur heard. Irregular heart rate  Pulmonary/Chest: Effort normal and breath sounds normal. No respiratory distress. She has no wheezes. She has no rales. She exhibits tenderness.  Bruises to chest wall  Abdominal: Soft. Bowel sounds are normal. There is no tenderness. There is no guarding.  Musculoskeletal: She exhibits deformity. She exhibits no edema.  Severe arthritis changes to her fingers. Can move all 4 extremities, generalized weakness, ambulating with assistance with wheelchair.   Lymphadenopathy:    She has no cervical adenopathy.  Neurological: She is alert and oriented to person, place, and time.  Skin: Skin is warm and dry. No rash noted. She is not diaphoretic.  Bruises to both hands, legs, face, chest wall. Sutures to laceration on left forehead. Abrasion to left periorbital area and nose tip. Skin tear to LUE and LLE.   Psychiatric: She  has a normal mood and affect.    Labs reviewed: Basic Metabolic Panel: Recent Labs    12/02/16 0511 12/04/16 0220 12/07/16 0750  NA 138 135 135  K 4.2 4.2 3.9  CL 103 104 102  CO2 28 25 26   GLUCOSE 110* 89 100*  BUN 15 10 13   CREATININE 0.72 0.65 0.72  CALCIUM 8.7* 8.5* 8.9  MG 1.9  --  2.1   Liver Function Tests: Recent Labs    05/12/16 1304 07/01/16 07/29/16 0740 12/04/16 0220  AST  --  22 19 24   ALT  --  11 9 13*  ALKPHOS  --  101 80 75  BILITOT  --   --  0.6 0.6  PROT 6.5  --  5.7* 5.5*  ALBUMIN  --   --  3.2* 2.7*   No results for input(s): LIPASE, AMYLASE in the last 8760 hours. No results for input(s): AMMONIA in the last 8760 hours. CBC: Recent Labs    12/01/16 1904 12/02/16 0511 12/04/16 0220  WBC 8.4 11.1* 9.9  NEUTROABS 6.3 8.0*  --   HGB 13.8 11.5* 11.6*  HCT 42.0 35.1* 34.6*  MCV 98.4 98.3 98.0  PLT 221 199 213   Cardiac Enzymes: Recent Labs    12/02/16 0511  TROPONINI 0.04*   BNP: Invalid input(s): POCBNP Lab Results  Component Value Date   HGBA1C 6.4 07/07/2015   Lab Results  Component Value Date   TSH 1.272 12/02/2016   Lab Results  Component Value Date   [id number] 093 05/12/2016   No results found for: FOLATE No results found for: IRON, TIBC, FERRITIN  Imaging and Procedures obtained prior to SNF admission: Dg Chest 2 View  Result Date: 12/01/2016 CLINICAL DATA:  Fall in parking lot today. EXAM: CHEST  2 VIEW COMPARISON:  11/16/2013 FINDINGS: The heart size and mediastinal contours are within normal limits. Aortic atherosclerosis. Changes of COPD again noted. Both lungs are clear. No evidence of pneumothorax or pleural effusion. Several old left rib fracture deformities are again noted. Lumbar spine fusion hardware and scoliosis again noted. IMPRESSION: COPD.  No active cardiopulmonary disease. Electronically Signed   By: Earle Gell M.D.   On: 12/01/2016 20:54   Dg Forearm Left  Result Date: 12/01/2016 CLINICAL DATA:   Fall in parking lot today. Left forearm pain and bruising. Initial encounter. EXAM: LEFT FOREARM - 2 VIEW COMPARISON:  None. FINDINGS: There is no evidence of fracture or other focal bone lesions. Generalized osteopenia noted. Moderate soft tissue swelling is seen involving the mid distal forearm. IMPRESSION: Moderate soft tissue swelling. No evidence of fracture or dislocation. Electronically Signed   By: Earle Gell M.D.   On: 12/01/2016 21:00   Ct Head Wo Contrast  Result Date: 12/01/2016 CLINICAL DATA:  Initial evaluation for acute trauma, fall. EXAM: CT HEAD WITHOUT CONTRAST CT MAXILLOFACIAL WITHOUT CONTRAST CT CERVICAL SPINE WITHOUT CONTRAST TECHNIQUE: Multidetector CT imaging of the head, cervical spine, and maxillofacial structures were performed using the standard protocol without intravenous contrast. Multiplanar CT image reconstructions of the cervical spine and maxillofacial structures were also generated. COMPARISON:  Prior CT from 01/23/2016. FINDINGS: CT HEAD FINDINGS Brain: Generalized age-related cerebral atrophy with moderate chronic small vessel ischemic disease. No acute intracranial hemorrhage. No acute large vessel territory infarct. No mass lesion, midline shift or mass effect. No hydrocephalus. No extra-axial fluid collection. Vascular: No hyperdense vessel. Scattered vascular calcifications noted within the carotid siphons. Skull: Large soft tissue contusion present at the forehead. Calvarium intact. Other: Mastoid air cells are clear. CT MAXILLOFACIAL FINDINGS Osseous: Zygomatic arches intact. No acute maxillary fracture. Pterygoid plates intact. There are acute minimally displaced bilateral nasal bone fractures. Nasal septum mildly bowed to the right but intact. Mandible intact. Mandibular condyles normally situated. No acute abnormality about the dentition. Few scattered dental caries noted. Orbits: Globes and orbital soft tissues within normal limits. Bony orbits intact. Sinuses:  Scattered mucoperiosteal thickening present within the ethmoidal air cells. Paranasal sinuses are otherwise clear. No hemosinus. Soft tissues: Large soft tissue contusion at the forehead. Mild soft tissue swelling about the nose. CT CERVICAL SPINE FINDINGS  Alignment: Straightening of the normal cervical lordosis. Grade 1 anterolisthesis of C3, C4 on C5, C7 on T1, T1 on T2, and T2 on T3. Trace retrolisthesis of C5 on C6. Findings are likely chronic and degenerative. Skull base and vertebrae: Skullbase intact. Normal C1-2 articulations are preserved in the dens is intact. Vertebral body heights maintained. No acute fracture. Soft tissues and spinal canal: No acute soft tissue abnormality within the neck. No abnormal prevertebral edema. Vascular calcifications present about the carotid bifurcations. Spinal canal within normal limits. Disc levels: Advanced degenerative spondylolysis at C5-6 and C6-7. Multilevel facet arthrosis with fusion and ankylosis, most notable within the upper cervical spine. Pronounced degenerative thickening of the tectorial membrane noted. Upper chest: Visualized upper chest within normal limits. Irregular biapical pleuroparenchymal thickening noted. Partially visualized lungs are otherwise grossly clear. Other: None. IMPRESSION: CT HEAD: 1. No acute intracranial process. 2. Large soft tissue contusion at the forehead. No calvarial fracture. 3. Age related cerebral atrophy with moderate chronic small vessel ischemic disease. CT MAXILLOFACIAL: 1. Acute minimally displaced bilateral nasal bone fractures. 2. No other acute maxillofacial injury. CT CERVICAL SPINE: 1. No acute traumatic injury within the cervical spine. 2. Advanced multilevel degenerative spondylolysis, most notable at C5-6 and C6-7. Electronically Signed   By: Jeannine Boga M.D.   On: 12/01/2016 20:47   Ct Cervical Spine Wo Contrast  Result Date: 12/01/2016 CLINICAL DATA:  Initial evaluation for acute trauma, fall.  EXAM: CT HEAD WITHOUT CONTRAST CT MAXILLOFACIAL WITHOUT CONTRAST CT CERVICAL SPINE WITHOUT CONTRAST TECHNIQUE: Multidetector CT imaging of the head, cervical spine, and maxillofacial structures were performed using the standard protocol without intravenous contrast. Multiplanar CT image reconstructions of the cervical spine and maxillofacial structures were also generated. COMPARISON:  Prior CT from 01/23/2016. FINDINGS: CT HEAD FINDINGS Brain: Generalized age-related cerebral atrophy with moderate chronic small vessel ischemic disease. No acute intracranial hemorrhage. No acute large vessel territory infarct. No mass lesion, midline shift or mass effect. No hydrocephalus. No extra-axial fluid collection. Vascular: No hyperdense vessel. Scattered vascular calcifications noted within the carotid siphons. Skull: Large soft tissue contusion present at the forehead. Calvarium intact. Other: Mastoid air cells are clear. CT MAXILLOFACIAL FINDINGS Osseous: Zygomatic arches intact. No acute maxillary fracture. Pterygoid plates intact. There are acute minimally displaced bilateral nasal bone fractures. Nasal septum mildly bowed to the right but intact. Mandible intact. Mandibular condyles normally situated. No acute abnormality about the dentition. Few scattered dental caries noted. Orbits: Globes and orbital soft tissues within normal limits. Bony orbits intact. Sinuses: Scattered mucoperiosteal thickening present within the ethmoidal air cells. Paranasal sinuses are otherwise clear. No hemosinus. Soft tissues: Large soft tissue contusion at the forehead. Mild soft tissue swelling about the nose. CT CERVICAL SPINE FINDINGS Alignment: Straightening of the normal cervical lordosis. Grade 1 anterolisthesis of C3, C4 on C5, C7 on T1, T1 on T2, and T2 on T3. Trace retrolisthesis of C5 on C6. Findings are likely chronic and degenerative. Skull base and vertebrae: Skullbase intact. Normal C1-2 articulations are preserved in the  dens is intact. Vertebral body heights maintained. No acute fracture. Soft tissues and spinal canal: No acute soft tissue abnormality within the neck. No abnormal prevertebral edema. Vascular calcifications present about the carotid bifurcations. Spinal canal within normal limits. Disc levels: Advanced degenerative spondylolysis at C5-6 and C6-7. Multilevel facet arthrosis with fusion and ankylosis, most notable within the upper cervical spine. Pronounced degenerative thickening of the tectorial membrane noted. Upper chest: Visualized upper chest within normal limits. Irregular biapical  pleuroparenchymal thickening noted. Partially visualized lungs are otherwise grossly clear. Other: None. IMPRESSION: CT HEAD: 1. No acute intracranial process. 2. Large soft tissue contusion at the forehead. No calvarial fracture. 3. Age related cerebral atrophy with moderate chronic small vessel ischemic disease. CT MAXILLOFACIAL: 1. Acute minimally displaced bilateral nasal bone fractures. 2. No other acute maxillofacial injury. CT CERVICAL SPINE: 1. No acute traumatic injury within the cervical spine. 2. Advanced multilevel degenerative spondylolysis, most notable at C5-6 and C6-7. Electronically Signed   By: Jeannine Boga M.D.   On: 12/01/2016 20:47   Ct Knee Left Wo Contrast  Result Date: 12/02/2016 CLINICAL DATA:  Left knee pain after fall onto concrete wall walking. Knee trauma, tenderness or a fusion or can walk, initial exam. EXAM: CT OF THE LEFT KNEE WITHOUT CONTRAST TECHNIQUE: Multidetector CT imaging of the LEFT knee was performed according to the standard protocol. Multiplanar CT image reconstructions were also generated. COMPARISON:  Radiographs yesterday FINDINGS: Bones/Joint/Cartilage No fractures. The bones are osteoporotic. Age related tibial femoral joint space narrowing. No joint effusion. Ligaments Suboptimally assessed by CT. Muscles and Tendons No large intramuscular hematoma. Quadriceps and  patellar tendons are intact. Soft tissues Soft tissue edema medially. Vascular calcifications incidentally noted. IMPRESSION: No acute fracture, no joint effusion. Bony under mineralization. Electronically Signed   By: Jeb Levering M.D.   On: 12/02/2016 02:09   Dg Pelvis Portable  Result Date: 12/02/2016 CLINICAL DATA:  Pain following fall EXAM: PORTABLE PELVIS 1-2 VIEWS COMPARISON:  None. FINDINGS: There is no evidence of pelvic fracture or dislocation. Joint spaces appear unremarkable. No erosive change. There is postoperative change in the lower lumbar and upper sacral regions. IMPRESSION: No fracture or dislocation. No appreciable arthropathy. Postoperative change in the lumbosacral region. Electronically Signed   By: Lowella Grip III M.D.   On: 12/02/2016 09:14   Dg Knee Complete 4 Views Left  Result Date: 12/01/2016 CLINICAL DATA:  Followup parking lot today. Knee pain and bruising. Initial encounter. EXAM: LEFT KNEE - COMPLETE 4+ VIEW COMPARISON:  None. FINDINGS: No evidence of fracture, dislocation, or joint effusion. No evidence of arthropathy or other focal bone abnormality. Generalized osteopenia noted. Mild peripheral vascular calcification noted. IMPRESSION: No acute findings.  Osteopenia. Electronically Signed   By: Earle Gell M.D.   On: 12/01/2016 20:59   Ct Maxillofacial Wo Cm  Result Date: 12/01/2016 CLINICAL DATA:  Initial evaluation for acute trauma, fall. EXAM: CT HEAD WITHOUT CONTRAST CT MAXILLOFACIAL WITHOUT CONTRAST CT CERVICAL SPINE WITHOUT CONTRAST TECHNIQUE: Multidetector CT imaging of the head, cervical spine, and maxillofacial structures were performed using the standard protocol without intravenous contrast. Multiplanar CT image reconstructions of the cervical spine and maxillofacial structures were also generated. COMPARISON:  Prior CT from 01/23/2016. FINDINGS: CT HEAD FINDINGS Brain: Generalized age-related cerebral atrophy with moderate chronic small vessel  ischemic disease. No acute intracranial hemorrhage. No acute large vessel territory infarct. No mass lesion, midline shift or mass effect. No hydrocephalus. No extra-axial fluid collection. Vascular: No hyperdense vessel. Scattered vascular calcifications noted within the carotid siphons. Skull: Large soft tissue contusion present at the forehead. Calvarium intact. Other: Mastoid air cells are clear. CT MAXILLOFACIAL FINDINGS Osseous: Zygomatic arches intact. No acute maxillary fracture. Pterygoid plates intact. There are acute minimally displaced bilateral nasal bone fractures. Nasal septum mildly bowed to the right but intact. Mandible intact. Mandibular condyles normally situated. No acute abnormality about the dentition. Few scattered dental caries noted. Orbits: Globes and orbital soft tissues within normal limits. Bony  orbits intact. Sinuses: Scattered mucoperiosteal thickening present within the ethmoidal air cells. Paranasal sinuses are otherwise clear. No hemosinus. Soft tissues: Large soft tissue contusion at the forehead. Mild soft tissue swelling about the nose. CT CERVICAL SPINE FINDINGS Alignment: Straightening of the normal cervical lordosis. Grade 1 anterolisthesis of C3, C4 on C5, C7 on T1, T1 on T2, and T2 on T3. Trace retrolisthesis of C5 on C6. Findings are likely chronic and degenerative. Skull base and vertebrae: Skullbase intact. Normal C1-2 articulations are preserved in the dens is intact. Vertebral body heights maintained. No acute fracture. Soft tissues and spinal canal: No acute soft tissue abnormality within the neck. No abnormal prevertebral edema. Vascular calcifications present about the carotid bifurcations. Spinal canal within normal limits. Disc levels: Advanced degenerative spondylolysis at C5-6 and C6-7. Multilevel facet arthrosis with fusion and ankylosis, most notable within the upper cervical spine. Pronounced degenerative thickening of the tectorial membrane noted. Upper  chest: Visualized upper chest within normal limits. Irregular biapical pleuroparenchymal thickening noted. Partially visualized lungs are otherwise grossly clear. Other: None. IMPRESSION: CT HEAD: 1. No acute intracranial process. 2. Large soft tissue contusion at the forehead. No calvarial fracture. 3. Age related cerebral atrophy with moderate chronic small vessel ischemic disease. CT MAXILLOFACIAL: 1. Acute minimally displaced bilateral nasal bone fractures. 2. No other acute maxillofacial injury. CT CERVICAL SPINE: 1. No acute traumatic injury within the cervical spine. 2. Advanced multilevel degenerative spondylolysis, most notable at C5-6 and C6-7. Electronically Signed   By: Jeannine Boga M.D.   On: 12/01/2016 20:47    Assessment/Plan  Unsteady gait Leading to her fall. Will have patient work with PT/OT as tolerated to regain strength and restore function.  Fall precautions are in place.  Generalized weakness With recent hospitalization. Will have her work with physical therapy and occupational therapy team to help with gait training and muscle strengthening exercises.fall precautions. Skin care. Encourage to be out of bed.   Nasal fracture Non displaced, seen by trauma team and conservative management recommended. Denies pain. Monitor for early signs of sinus infection. Continue tylenol 500 mg q6h prn and tramadol 50 mg q6h prn pain. Also on oxyIR 5 mg q6h prn pain.  Chest tenderness Musculoskeletal with recent trauma. Continue pain med as above. Encouraged to use incentive spirometer.  afib Controlled HR this visit. Continue atenolol 12.5 mg bid for rate control and aspirin ec 81 mg daily. Not on anticoagulation due to gi bleed history and high fall risk.   Laceration left forehead Reviewed ED note. 2 sutures placed on 12/01/16. Laceration healing well. Remove sutures on 12/13/16. Continue skin care.   Anemia unspecified With recent fall and bleed/ bruise. Check h&h  Protein  calorie malnutrition Encourage po intake. Low albumin on review with low BMI and weight. 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 Reviewed OV note from GI 11/29/16. Aspiration precautions, SLP consult. Continue dysphagia diet. She does not want dilation. Continue puree diet and full liquid.   Hyperlipidemia Continue her zocor, no changes  neuropathy Continue cymbalta, no changes made.   Family/ staff Communication: reviewed care plan with patient and charge nurse.    Labs/tests ordered: cbc, bmp 1 week  Blanchie Serve, MD Internal Medicine [facility] [address] [place], [place] [address] Cell Phone (Monday-Friday 8 am - 5 pm): [phone] On Call: [phone] and follow prompts after 5 pm and on weekends Office Phone: [phone] Office Fax: [phone]

## 2016-12-16 LAB — BASIC METABOLIC PANEL
BUN: 17 (ref 4–21)
Creatinine: 0.8 (ref <=1.1)
Glucose: 86
Potassium: 4.8 (ref 3.4–5.3)
Sodium: 137 (ref 137–147)

## 2016-12-16 LAB — CBC AND DIFFERENTIAL
HCT: 34 — AB (ref 36–46)
Hemoglobin: 11.8 — AB (ref 12.0–16.0)
Platelets: 276 (ref 150–399)
WBC: 6.9

## 2016-12-17 ENCOUNTER — Other Ambulatory Visit: Payer: Self-pay | Admitting: *Deleted

## 2016-12-22 ENCOUNTER — Non-Acute Institutional Stay (SKILLED_NURSING_FACILITY): Payer: Medicare Other | Admitting: Nurse Practitioner

## 2016-12-22 ENCOUNTER — Encounter: Payer: Self-pay | Admitting: Nurse Practitioner

## 2016-12-22 DIAGNOSIS — I4891 Unspecified atrial fibrillation: Secondary | ICD-10-CM | POA: Diagnosis not present

## 2016-12-22 DIAGNOSIS — E039 Hypothyroidism, unspecified: Secondary | ICD-10-CM

## 2016-12-22 DIAGNOSIS — R1310 Dysphagia, unspecified: Secondary | ICD-10-CM

## 2016-12-22 DIAGNOSIS — F422 Mixed obsessional thoughts and acts: Secondary | ICD-10-CM

## 2016-12-22 DIAGNOSIS — E7800 Pure hypercholesterolemia, unspecified: Secondary | ICD-10-CM

## 2016-12-22 DIAGNOSIS — R1319 Other dysphagia: Secondary | ICD-10-CM

## 2016-12-22 NOTE — Progress Notes (Signed)
Location:  [facility] Room Number: 40-A Place of Service:  SNF (31)  Provider: Marlana Latus  NP  PCP: Blanchie Serve, MD Patient Care Team: Blanchie Serve, MD as PCP - General (Internal Medicine) Angelia Mould, MD as Attending Physician (Vascular Surgery) [name], MD as Attending Physician (Obstetrics and Gynecology) [name], MD (Orthopedic Surgery) [name], MD (Ophthalmology) [name], MD as Consulting Physician (Urology) [name], MD as Consulting Physician (Cardiology) [name], PA-C as Physician Assistant (Otolaryngology) [name], MD as Consulting Physician (Otolaryngology) [name], MD as Consulting Physician (Dermatology) [name], MD as Consulting Physician (Physical Medicine and Rehabilitation) [name], MD as Attending Physician (Gastroenterology) Mast, Man X, NP as Nurse Practitioner (Internal Medicine) Kathrynn Ducking, MD as Consulting Physician (Neurology) Debara Pickett Nadean Corwin, MD as Consulting Physician (Cardiology) Druscilla Brownie, MD as Consulting Physician (Dermatology)  Extended Emergency Contact Information Primary Emergency Contact: [name] of [place] Phone: [phone] Mobile Phone: [phone] Relation: Son  Code Status: Full Code Goals of care:  Advanced Directive information Advanced Directives 12/22/2016  Does Patient Have a Medical Advance Directive? Yes  Type of Advance Directive [place]  Does patient want to make changes to medical advance directive? No - Patient declined  Copy of [place] in Chart? Yes  Would patient like information on creating a medical advance directive? -     No Known Allergies  Chief Complaint  Patient presents with  . Discharge Note    Back to [facility]    HPI:  83 y.o. female with medical history of OCD, Afib,  HLD, dysphagia admitted to SNF [facility] following  hospitalization 12/01/16 to 12/07/16 for s/p fall, displaced nasal bone fracture, facial laceration, and Afib RVR. Her heart rate was in control during her stay at [facility], her facial laceration and fracture nasal bone are healing nicely, she has regained her physical strength. She is hemodynamically and medically stable to return to [place].    Past Medical History:  Diagnosis Date  . Abdominal bloating   . Atrial fibrillation ([place])   . Bruises easily   . Carotid artery occlusion    LEFT  . Dizziness   . DOE (dyspnea on exertion) 04/02/2014  . Fall at home Sept 2013, Dec. 2013  Jun 08, 2012  . GERD (gastroesophageal reflux disease) 02/11/2015  . Headache(784.0)   . Hoarseness   . Hypercholesterolemia   . Hyperglycemia 04/02/2014   Glucose 178 mg percent on 01/22/2014 04/05/14 Hgb A1c 6.6 Diet controlled.    . Hypothyroidism 04/15/2015  . Neuropathy    PERIPHERAL  . Pruritus   . Scoliosis   . Varicose veins     Past Surgical History:  Procedure Laterality Date  . ABDOMINAL HYSTERECTOMY  1954  . CHOLECYSTECTOMY  1997  . ESOPHAGOGASTRODUODENOSCOPY (EGD) WITH PROPOFOL N/A 11/25/2016   Procedure: ESOPHAGOGASTRODUODENOSCOPY (EGD) WITH PROPOFOL;  Surgeon: [name], MD;  Location: [facility];  Service: Endoscopy;  Laterality: N/A;  . [place]   correct scoliosis      reports that  has never smoked. she has never used smokeless tobacco. She reports that she does not drink alcohol or use drugs. Social History   Socioeconomic History  . Marital status: Widowed    Spouse name: Not on file  . Number of children: 2  . Years of education: 12+  . Highest education level: Not on file  Social Needs  .  Financial resource strain: Not on file  . Food insecurity - worry: Not on file  . Food insecurity - inability: Not on file  . Transportation needs - medical: Not on file  . Transportation needs - non-medical: Not on file  Occupational History  . 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 Smoking status: Never Smoker  . Smokeless tobacco: Never Used  Substance and Sexual Activity  . Alcohol use: No    Alcohol/week: 0.0 oz  . Drug use: No  . Sexual activity: No    Birth control/protection: None  Other Topics Concern  . Not on file  Social History Narrative   Patient lives at [facility] 03/05/2014   Patient is a widowed.    Patient is retired.    Patient has no children but adopted twin sons.    Patient has a college degree.    Never smoked   Alcohol none   Exercise with therapy   Walks with cane   POA       Patient only drinks de-caf drinks.   Patient is right handed.   Functional Status Survey:    No Known Allergies  Pertinent  Health Maintenance Due  Topic Date Due  . DEXA SCAN  01/11/2017 (Originally 01/31/1987)  . PNA vac Low Risk Adult (2 of 2 - PPSV23) 01/11/2017 (Originally 08/22/2014)  . INFLUENZA VACCINE  Completed    Medications: Outpatient Encounter Medications as of 12/22/2016  Medication Sig  . acetaminophen (TYLENOL) 500 MG tablet Take 500 mg every 6 (six) hours as needed by mouth (for pain/back pain.).   Marland Kitchen antiseptic oral rinse (BIOTENE) LIQD 15 mLs by Mouth Rinse route as needed for dry mouth.  Marland Kitchen aspirin EC 81 MG tablet Take 81 mg daily by mouth.  Marland Kitchen atenolol (TENORMIN) 25 MG tablet Take 0.5 tablets (12.5 mg total) by mouth 2 (two) times daily.  . Biotin 5000 MCG CAPS Take 5,000 mcg daily by mouth.   . Calcium-Vitamin D-Vitamin K (VIACTIV) 884-166-06 MG-UNT-MCG CHEW Chew 1 tablet daily by mouth.  . DULoxetine (CYMBALTA) 20 MG capsule TAKE 1 CAPSULE AT BEDTIME.  Marland Kitchen lactose free nutrition (BOOST) LIQD Take 237 mLs by mouth 3 (three) times daily between meals.  . multivitamin-iron-minerals-folic acid (CENTRUM) chewable tablet Chew 1 tablet daily by mouth.  . neomycin-bacitracin-polymyxin (NEOSPORIN) OINT Apply 1 application topically 3 (three) times daily.  .  ondansetron (ZOFRAN) 4 MG tablet Take 1 tablet (4 mg total) by mouth every 6 (six) hours as needed for nausea.  Marland Kitchen oxyCODONE (OXY IR/ROXICODONE) 5 MG immediate release tablet Take 1 tablet (5 mg total) by mouth every 6 (six) hours as needed for severe pain.  . polyethylene glycol (MIRALAX / GLYCOLAX) packet Take 17 g daily as needed by mouth (for constipation).   . simvastatin (ZOCOR) 40 MG tablet Take 20 mg at bedtime by mouth.   . traMADol (ULTRAM) 50 MG tablet Take 1 tablet (50 mg total) by mouth every 6 (six) hours as needed for moderate pain.   No facility-administered encounter medications on file as of 12/22/2016.     Review of Systems  Constitutional: Negative for activity change, appetite change, chills, diaphoresis, fatigue and fever.  HENT: Positive for hearing loss. Negative for congestion, trouble swallowing and voice change.   Eyes: Negative for visual disturbance.  Respiratory: Negative for apnea, cough, choking, chest tightness, shortness of breath and wheezing.   Cardiovascular: Negative for chest pain, palpitations and leg  swelling.  Gastrointestinal: Negative for abdominal distention, abdominal pain, constipation, diarrhea, nausea and vomiting.  Endocrine: Negative for cold intolerance.  Genitourinary: Negative for difficulty urinating, dysuria, frequency and urgency.  Musculoskeletal: Positive for gait problem. Negative for arthralgias, back pain, joint swelling and myalgias.  Neurological: Negative for tremors, speech difficulty, weakness and headaches.  Psychiatric/Behavioral: Negative for agitation, behavioral problems, hallucinations and sleep disturbance. The patient is not nervous/anxious.     Vitals:   12/22/16 1243  BP: 122/70  Pulse: 84  Resp: 18  Temp: (!) 96.6 F (35.9 C)  Weight: 92 lb (41.7 kg)  Height: 5\' 2"  (1.575 m)   Body mass index is 16.83 kg/m. Physical Exam  Constitutional: She is oriented to person, place, and time. She appears  well-developed and well-nourished.  HENT:  Head: Normocephalic and atraumatic.  Eyes: Conjunctivae and EOM are normal. Right eye exhibits no discharge. Left eye exhibits no discharge.  Neck: Normal range of motion. Neck supple. No JVD present. No thyromegaly present.  Cardiovascular: Normal rate and normal heart sounds.  No murmur heard. Irregular heart beats  Pulmonary/Chest: Effort normal and breath sounds normal. She has no wheezes. She has no rales.  Abdominal: Soft. Bowel sounds are normal. She exhibits no distension. There is no tenderness. There is no rebound and no guarding.  Musculoskeletal: Normal range of motion. She exhibits no edema or tenderness.  Self transfer, ambulates with walker.   Neurological: She is alert and oriented to person, place, and time. She exhibits abnormal muscle tone. Coordination normal.  Skin: Skin is dry. No rash noted. No erythema. No pallor.  Psychiatric: She has a normal mood and affect. Her behavior is normal. Judgment and thought content normal.    Labs reviewed: Basic Metabolic Panel: Recent Labs    12/02/16 0511 12/04/16 0220 12/07/16 0750 12/16/16  NA 138 135 135 137  K 4.2 4.2 3.9 4.8  CL 103 104 102  --   CO2 28 25 26   --   GLUCOSE 110* 89 100*  --   BUN 15 10 13 17   CREATININE 0.72 0.65 0.72 0.8  CALCIUM 8.7* 8.5* 8.9  --   MG 1.9  --  2.1  --    Liver Function Tests: Recent Labs    05/12/16 1304 07/01/16 07/29/16 0740 12/04/16 0220  AST  --  22 19 24   ALT  --  11 9 13*  ALKPHOS  --  101 80 75  BILITOT  --   --  0.6 0.6  PROT 6.5  --  5.7* 5.5*  ALBUMIN  --   --  3.2* 2.7*   No results for input(s): LIPASE, AMYLASE in the last 8760 hours. No results for input(s): AMMONIA in the last 8760 hours. CBC: Recent Labs    12/01/16 1904 12/02/16 0511 12/04/16 0220 12/16/16  WBC 8.4 11.1* 9.9 6.9  NEUTROABS 6.3 8.0*  --   --   HGB 13.8 11.5* 11.6* 11.8*  HCT 42.0 35.1* 34.6* 34*  MCV 98.4 98.3 98.0  --   PLT 221 199 213  276   Cardiac Enzymes: Recent Labs    12/02/16 0511  TROPONINI 0.04*   BNP: Invalid input(s): POCBNP CBG: No results for input(s): GLUCAP in the last 8760 hours.  Procedures and Imaging Studies During Stay: Dg Chest 2 View  Result Date: 12/01/2016 CLINICAL DATA:  Fall in parking lot today. EXAM: CHEST  2 VIEW COMPARISON:  11/16/2013 FINDINGS: The heart size and mediastinal contours are within normal limits.  Aortic atherosclerosis. Changes of COPD again noted. Both lungs are clear. No evidence of pneumothorax or pleural effusion. Several old left rib fracture deformities are again noted. Lumbar spine fusion hardware and scoliosis again noted. IMPRESSION: COPD.  No active cardiopulmonary disease. Electronically Signed   By: Earle Gell M.D.   On: 12/01/2016 20:54   Dg Forearm Left  Result Date: 12/01/2016 CLINICAL DATA:  Fall in parking lot today. Left forearm pain and bruising. Initial encounter. EXAM: LEFT FOREARM - 2 VIEW COMPARISON:  None. FINDINGS: There is no evidence of fracture or other focal bone lesions. Generalized osteopenia noted. Moderate soft tissue swelling is seen involving the mid distal forearm. IMPRESSION: Moderate soft tissue swelling. No evidence of fracture or dislocation. Electronically Signed   By: Earle Gell M.D.   On: 12/01/2016 21:00   Ct Head Wo Contrast  Result Date: 12/01/2016 CLINICAL DATA:  Initial evaluation for acute trauma, fall. EXAM: CT HEAD WITHOUT CONTRAST CT MAXILLOFACIAL WITHOUT CONTRAST CT CERVICAL SPINE WITHOUT CONTRAST TECHNIQUE: Multidetector CT imaging of the head, cervical spine, and maxillofacial structures were performed using the standard protocol without intravenous contrast. Multiplanar CT image reconstructions of the cervical spine and maxillofacial structures were also generated. COMPARISON:  Prior CT from 01/23/2016. FINDINGS: CT HEAD FINDINGS Brain: Generalized age-related cerebral atrophy with moderate chronic small vessel ischemic  disease. No acute intracranial hemorrhage. No acute large vessel territory infarct. No mass lesion, midline shift or mass effect. No hydrocephalus. No extra-axial fluid collection. Vascular: No hyperdense vessel. Scattered vascular calcifications noted within the carotid siphons. Skull: Large soft tissue contusion present at the forehead. Calvarium intact. Other: Mastoid air cells are clear. CT MAXILLOFACIAL FINDINGS Osseous: Zygomatic arches intact. No acute maxillary fracture. Pterygoid plates intact. There are acute minimally displaced bilateral nasal bone fractures. Nasal septum mildly bowed to the right but intact. Mandible intact. Mandibular condyles normally situated. No acute abnormality about the dentition. Few scattered dental caries noted. Orbits: Globes and orbital soft tissues within normal limits. Bony orbits intact. Sinuses: Scattered mucoperiosteal thickening present within the ethmoidal air cells. Paranasal sinuses are otherwise clear. No hemosinus. Soft tissues: Large soft tissue contusion at the forehead. Mild soft tissue swelling about the nose. CT CERVICAL SPINE FINDINGS Alignment: Straightening of the normal cervical lordosis. Grade 1 anterolisthesis of C3, C4 on C5, C7 on T1, T1 on T2, and T2 on T3. Trace retrolisthesis of C5 on C6. Findings are likely chronic and degenerative. Skull base and vertebrae: Skullbase intact. Normal C1-2 articulations are preserved in the dens is intact. Vertebral body heights maintained. No acute fracture. Soft tissues and spinal canal: No acute soft tissue abnormality within the neck. No abnormal prevertebral edema. Vascular calcifications present about the carotid bifurcations. Spinal canal within normal limits. Disc levels: Advanced degenerative spondylolysis at C5-6 and C6-7. Multilevel facet arthrosis with fusion and ankylosis, most notable within the upper cervical spine. Pronounced degenerative thickening of the tectorial membrane noted. Upper chest:  Visualized upper chest within normal limits. Irregular biapical pleuroparenchymal thickening noted. Partially visualized lungs are otherwise grossly clear. Other: None. IMPRESSION: CT HEAD: 1. No acute intracranial process. 2. Large soft tissue contusion at the forehead. No calvarial fracture. 3. Age related cerebral atrophy with moderate chronic small vessel ischemic disease. CT MAXILLOFACIAL: 1. Acute minimally displaced bilateral nasal bone fractures. 2. No other acute maxillofacial injury. CT CERVICAL SPINE: 1. No acute traumatic injury within the cervical spine. 2. Advanced multilevel degenerative spondylolysis, most notable at C5-6 and C6-7. Electronically Signed   By:  Jeannine Boga M.D.   On: 12/01/2016 20:47   Ct Cervical Spine Wo Contrast  Result Date: 12/01/2016 CLINICAL DATA:  Initial evaluation for acute trauma, fall. EXAM: CT HEAD WITHOUT CONTRAST CT MAXILLOFACIAL WITHOUT CONTRAST CT CERVICAL SPINE WITHOUT CONTRAST TECHNIQUE: Multidetector CT imaging of the head, cervical spine, and maxillofacial structures were performed using the standard protocol without intravenous contrast. Multiplanar CT image reconstructions of the cervical spine and maxillofacial structures were also generated. COMPARISON:  Prior CT from 01/23/2016. FINDINGS: CT HEAD FINDINGS Brain: Generalized age-related cerebral atrophy with moderate chronic small vessel ischemic disease. No acute intracranial hemorrhage. No acute large vessel territory infarct. No mass lesion, midline shift or mass effect. No hydrocephalus. No extra-axial fluid collection. Vascular: No hyperdense vessel. Scattered vascular calcifications noted within the carotid siphons. Skull: Large soft tissue contusion present at the forehead. Calvarium intact. Other: Mastoid air cells are clear. CT MAXILLOFACIAL FINDINGS Osseous: Zygomatic arches intact. No acute maxillary fracture. Pterygoid plates intact. There are acute minimally displaced bilateral nasal  bone fractures. Nasal septum mildly bowed to the right but intact. Mandible intact. Mandibular condyles normally situated. No acute abnormality about the dentition. Few scattered dental caries noted. Orbits: Globes and orbital soft tissues within normal limits. Bony orbits intact. Sinuses: Scattered mucoperiosteal thickening present within the ethmoidal air cells. Paranasal sinuses are otherwise clear. No hemosinus. Soft tissues: Large soft tissue contusion at the forehead. Mild soft tissue swelling about the nose. CT CERVICAL SPINE FINDINGS Alignment: Straightening of the normal cervical lordosis. Grade 1 anterolisthesis of C3, C4 on C5, C7 on T1, T1 on T2, and T2 on T3. Trace retrolisthesis of C5 on C6. Findings are likely chronic and degenerative. Skull base and vertebrae: Skullbase intact. Normal C1-2 articulations are preserved in the dens is intact. Vertebral body heights maintained. No acute fracture. Soft tissues and spinal canal: No acute soft tissue abnormality within the neck. No abnormal prevertebral edema. Vascular calcifications present about the carotid bifurcations. Spinal canal within normal limits. Disc levels: Advanced degenerative spondylolysis at C5-6 and C6-7. Multilevel facet arthrosis with fusion and ankylosis, most notable within the upper cervical spine. Pronounced degenerative thickening of the tectorial membrane noted. Upper chest: Visualized upper chest within normal limits. Irregular biapical pleuroparenchymal thickening noted. Partially visualized lungs are otherwise grossly clear. Other: None. IMPRESSION: CT HEAD: 1. No acute intracranial process. 2. Large soft tissue contusion at the forehead. No calvarial fracture. 3. Age related cerebral atrophy with moderate chronic small vessel ischemic disease. CT MAXILLOFACIAL: 1. Acute minimally displaced bilateral nasal bone fractures. 2. No other acute maxillofacial injury. CT CERVICAL SPINE: 1. No acute traumatic injury within the cervical  spine. 2. Advanced multilevel degenerative spondylolysis, most notable at C5-6 and C6-7. Electronically Signed   By: Jeannine Boga M.D.   On: 12/01/2016 20:47   Ct Knee Left Wo Contrast  Result Date: 12/02/2016 CLINICAL DATA:  Left knee pain after fall onto concrete wall walking. Knee trauma, tenderness or a fusion or can walk, initial exam. EXAM: CT OF THE LEFT KNEE WITHOUT CONTRAST TECHNIQUE: Multidetector CT imaging of the LEFT knee was performed according to the standard protocol. Multiplanar CT image reconstructions were also generated. COMPARISON:  Radiographs yesterday FINDINGS: Bones/Joint/Cartilage No fractures. The bones are osteoporotic. Age related tibial femoral joint space narrowing. No joint effusion. Ligaments Suboptimally assessed by CT. Muscles and Tendons No large intramuscular hematoma. Quadriceps and patellar tendons are intact. Soft tissues Soft tissue edema medially. Vascular calcifications incidentally noted. IMPRESSION: No acute fracture, no joint effusion.  Bony under mineralization. Electronically Signed   By: Jeb Levering M.D.   On: 12/02/2016 02:09   Dg Pelvis Portable  Result Date: 12/02/2016 CLINICAL DATA:  Pain following fall EXAM: PORTABLE PELVIS 1-2 VIEWS COMPARISON:  None. FINDINGS: There is no evidence of pelvic fracture or dislocation. Joint spaces appear unremarkable. No erosive change. There is postoperative change in the lower lumbar and upper sacral regions. IMPRESSION: No fracture or dislocation. No appreciable arthropathy. Postoperative change in the lumbosacral region. Electronically Signed   By: Lowella Grip III M.D.   On: 12/02/2016 09:14   Dg Chest Port 1 View  Result Date: 12/06/2016 CLINICAL DATA:  Oxygen desaturation, history GERD, atrial fibrillation EXAM: PORTABLE CHEST 1 VIEW COMPARISON:  Portable exam 0912 hours compared to 12/01/2016 FINDINGS: Normal heart size, mediastinal contours, and pulmonary vascularity. Atherosclerotic  calcification aorta. Emphysematous and bronchitic changes consistent with COPD. Bibasilar effusions and atelectasis. No definite infiltrate or pneumothorax. Diffuse osseous demineralization with thoracolumbar scoliosis. Old fractures of the LEFT eighth and ninth ribs. IMPRESSION: COPD changes with small bibasilar pleural effusions and basilar atelectasis. Electronically Signed   By: Lavonia Dana M.D.   On: 12/06/2016 09:41   Dg Knee Complete 4 Views Left  Result Date: 12/01/2016 CLINICAL DATA:  Followup parking lot today. Knee pain and bruising. Initial encounter. EXAM: LEFT KNEE - COMPLETE 4+ VIEW COMPARISON:  None. FINDINGS: No evidence of fracture, dislocation, or joint effusion. No evidence of arthropathy or other focal bone abnormality. Generalized osteopenia noted. Mild peripheral vascular calcification noted. IMPRESSION: No acute findings.  Osteopenia. Electronically Signed   By: Earle Gell M.D.   On: 12/01/2016 20:59   Ct Maxillofacial Wo Cm  Result Date: 12/01/2016 CLINICAL DATA:  Initial evaluation for acute trauma, fall. EXAM: CT HEAD WITHOUT CONTRAST CT MAXILLOFACIAL WITHOUT CONTRAST CT CERVICAL SPINE WITHOUT CONTRAST TECHNIQUE: Multidetector CT imaging of the head, cervical spine, and maxillofacial structures were performed using the standard protocol without intravenous contrast. Multiplanar CT image reconstructions of the cervical spine and maxillofacial structures were also generated. COMPARISON:  Prior CT from 01/23/2016. FINDINGS: CT HEAD FINDINGS Brain: Generalized age-related cerebral atrophy with moderate chronic small vessel ischemic disease. No acute intracranial hemorrhage. No acute large vessel territory infarct. No mass lesion, midline shift or mass effect. No hydrocephalus. No extra-axial fluid collection. Vascular: No hyperdense vessel. Scattered vascular calcifications noted within the carotid siphons. Skull: Large soft tissue contusion present at the forehead. Calvarium  intact. Other: Mastoid air cells are clear. CT MAXILLOFACIAL FINDINGS Osseous: Zygomatic arches intact. No acute maxillary fracture. Pterygoid plates intact. There are acute minimally displaced bilateral nasal bone fractures. Nasal septum mildly bowed to the right but intact. Mandible intact. Mandibular condyles normally situated. No acute abnormality about the dentition. Few scattered dental caries noted. Orbits: Globes and orbital soft tissues within normal limits. Bony orbits intact. Sinuses: Scattered mucoperiosteal thickening present within the ethmoidal air cells. Paranasal sinuses are otherwise clear. No hemosinus. Soft tissues: Large soft tissue contusion at the forehead. Mild soft tissue swelling about the nose. CT CERVICAL SPINE FINDINGS Alignment: Straightening of the normal cervical lordosis. Grade 1 anterolisthesis of C3, C4 on C5, C7 on T1, T1 on T2, and T2 on T3. Trace retrolisthesis of C5 on C6. Findings are likely chronic and degenerative. Skull base and vertebrae: Skullbase intact. Normal C1-2 articulations are preserved in the dens is intact. Vertebral body heights maintained. No acute fracture. Soft tissues and spinal canal: No acute soft tissue abnormality within the neck. No abnormal prevertebral  edema. Vascular calcifications present about the carotid bifurcations. Spinal canal within normal limits. Disc levels: Advanced degenerative spondylolysis at C5-6 and C6-7. Multilevel facet arthrosis with fusion and ankylosis, most notable within the upper cervical spine. Pronounced degenerative thickening of the tectorial membrane noted. Upper chest: Visualized upper chest within normal limits. Irregular biapical pleuroparenchymal thickening noted. Partially visualized lungs are otherwise grossly clear. Other: None. IMPRESSION: CT HEAD: 1. No acute intracranial process. 2. Large soft tissue contusion at the forehead. No calvarial fracture. 3. Age related cerebral atrophy with moderate chronic small  vessel ischemic disease. CT MAXILLOFACIAL: 1. Acute minimally displaced bilateral nasal bone fractures. 2. No other acute maxillofacial injury. CT CERVICAL SPINE: 1. No acute traumatic injury within the cervical spine. 2. Advanced multilevel degenerative spondylolysis, most notable at C5-6 and C6-7. Electronically Signed   By: Jeannine Boga M.D.   On: 12/01/2016 20:47    Assessment/Plan:   Atrial fibrillation with RVR (HCC) Heart rate is in control, continue Atenolol 12.5mg  bid, ASA 81mg  daily.   OCD (obsessive compulsive disorder) Her mood is stable, continue Cymbalta 20mg  po daily.   Dysphagia 08/09/14 Esophagogram and Ba swallow: tight cricopharyngeus, esophageal dysmotility, and GERD. Narrow lumen and difficulty passing a barium tablet. 12/10/16 mech soft, chopped meat, thin liquids. -   Hypercholesterolemia Continue Zocor 20mg  daily.   Hypothyroidism Last TSH 3.08 11/06/15, no taking thyroid replacement.     Patient is being discharged with the following home health services:    Patient is being discharged with the following durable medical equipment:    Patient has been advised to f/u with their PCP in 1-2 weeks to for a transitions of care visit.  Social services at their facility was responsible for arranging this appointment.  Pt was provided with adequate prescriptions of noncontrolled medications to reach the scheduled appointment .  For controlled substances, a limited supply was provided as appropriate for the individual patient.  If the pt normally receives these medications from a pain clinic or has a contract with another physician, these medications should be received from that clinic or physician only).    Future labs/tests needed:  None  Time spend 25 minutes.

## 2016-12-22 NOTE — Assessment & Plan Note (Signed)
Continue Zocor 20 mg daily.

## 2016-12-22 NOTE — Assessment & Plan Note (Signed)
Heart rate is in control, continue Atenolol 12.5mg  bid, ASA 81mg  daily.

## 2016-12-22 NOTE — Assessment & Plan Note (Signed)
Her mood is stable, continue Cymbalta 20mg  po daily.

## 2016-12-22 NOTE — Assessment & Plan Note (Signed)
08/09/14 Esophagogram and Ba swallow: tight cricopharyngeus, esophageal dysmotility, and GERD. Narrow lumen and difficulty passing a barium tablet. 12/10/16 mech soft, chopped meat, thin liquids. -  

## 2016-12-22 NOTE — Assessment & Plan Note (Addendum)
Last TSH 3.08 11/06/15, no taking thyroid replacement.

## 2016-12-25 DIAGNOSIS — Z9181 History of falling: Secondary | ICD-10-CM | POA: Diagnosis not present

## 2016-12-25 DIAGNOSIS — R2681 Unsteadiness on feet: Secondary | ICD-10-CM | POA: Diagnosis not present

## 2016-12-25 DIAGNOSIS — M6281 Muscle weakness (generalized): Secondary | ICD-10-CM | POA: Diagnosis not present

## 2016-12-25 DIAGNOSIS — R1312 Dysphagia, oropharyngeal phase: Secondary | ICD-10-CM | POA: Diagnosis not present

## 2016-12-25 DIAGNOSIS — R29898 Other symptoms and signs involving the musculoskeletal system: Secondary | ICD-10-CM | POA: Diagnosis not present

## 2016-12-25 DIAGNOSIS — R296 Repeated falls: Secondary | ICD-10-CM | POA: Diagnosis not present

## 2016-12-27 ENCOUNTER — Non-Acute Institutional Stay: Payer: Medicare Other | Admitting: Internal Medicine

## 2016-12-27 ENCOUNTER — Encounter: Payer: Self-pay | Admitting: Internal Medicine

## 2016-12-27 DIAGNOSIS — Z9181 History of falling: Secondary | ICD-10-CM | POA: Diagnosis not present

## 2016-12-27 DIAGNOSIS — R2681 Unsteadiness on feet: Secondary | ICD-10-CM | POA: Diagnosis not present

## 2016-12-27 DIAGNOSIS — E785 Hyperlipidemia, unspecified: Secondary | ICD-10-CM | POA: Diagnosis not present

## 2016-12-27 DIAGNOSIS — G8929 Other chronic pain: Secondary | ICD-10-CM | POA: Diagnosis not present

## 2016-12-27 DIAGNOSIS — I482 Chronic atrial fibrillation, unspecified: Secondary | ICD-10-CM

## 2016-12-27 DIAGNOSIS — R296 Repeated falls: Secondary | ICD-10-CM | POA: Diagnosis not present

## 2016-12-27 DIAGNOSIS — R1319 Other dysphagia: Secondary | ICD-10-CM

## 2016-12-27 DIAGNOSIS — E440 Moderate protein-calorie malnutrition: Secondary | ICD-10-CM

## 2016-12-27 DIAGNOSIS — M6281 Muscle weakness (generalized): Secondary | ICD-10-CM | POA: Diagnosis not present

## 2016-12-27 DIAGNOSIS — R1310 Dysphagia, unspecified: Secondary | ICD-10-CM

## 2016-12-27 DIAGNOSIS — R29898 Other symptoms and signs involving the musculoskeletal system: Secondary | ICD-10-CM | POA: Diagnosis not present

## 2016-12-27 DIAGNOSIS — R1312 Dysphagia, oropharyngeal phase: Secondary | ICD-10-CM | POA: Diagnosis not present

## 2016-12-27 DIAGNOSIS — M5441 Lumbago with sciatica, right side: Secondary | ICD-10-CM

## 2016-12-27 NOTE — Progress Notes (Signed)
Provider:  Blanchie Serve MD  Location:  [facility] Room Number: 1 Place of Service:  ALF (13)  PCP: Blanchie Serve, MD Patient Care Team: Blanchie Serve, MD as PCP - General (Internal Medicine) Angelia Mould, MD as Attending Physician (Vascular Surgery) [name], MD as Attending Physician (Obstetrics and Gynecology) [name], MD (Orthopedic Surgery) [name], MD (Ophthalmology) [name], MD as Consulting Physician (Urology) [name], MD as Consulting Physician (Cardiology) [name], PA-C as Physician Assistant (Otolaryngology) [name], MD as Consulting Physician (Otolaryngology) [name], MD as Consulting Physician (Dermatology) [name], MD as Consulting Physician (Physical Medicine and Rehabilitation) [name], MD as Attending Physician (Gastroenterology) Mast, Man X, NP as Nurse Practitioner (Internal Medicine) Kathrynn Ducking, MD as Consulting Physician (Neurology) Debara Pickett Nadean Corwin, MD as Consulting Physician (Cardiology) Druscilla Brownie, MD as Consulting Physician (Dermatology)  Extended Emergency Contact Information Primary Emergency Contact: [name] of [place] Phone: [phone] Mobile Phone: [phone] Relation: Son  Code Status: full code  Goals of Care: Advanced Directive information Advanced Directives 12/27/2016  Does Patient Have a Medical Advance Directive? Yes  Type of Paramedic of [place];Living will  Does patient want to make changes to medical advance directive? No - Patient declined  Copy of [place] in Chart? Yes  Would patient like information on creating a medical advance directive? -      Chief Complaint  Patient presents with  . Readmit to AL    Readmission Visit     HPI: Patient is a 83 y.o. female seen today for admission visit. She was residing in [place] at [facility] prior to  hospitalization from 12/01/16-11/127/18 post fall with bruising and laceration to her face and minimally displaced nasal bone fracture. She was in afib with RVR. She underwent rehabilitation at SNF at [facility] and now is back to AL at [facility] for long term care. She is seen in her room today with her son present. She is ambulating with her walker. She denies any acute health concern this visit. She has medical history of afib, dysphagia, hyperlipidemia among others.   Past Medical History:  Diagnosis Date  . Abdominal bloating   . Atrial fibrillation ([place])   . Bruises easily   . Carotid artery occlusion    LEFT  . Dizziness   . DOE (dyspnea on exertion) 04/02/2014  . Fall at home Sept 2013, Dec. 2013  Jun 08, 2012  . GERD (gastroesophageal reflux disease) 02/11/2015  . Headache(784.0)   . Hoarseness   . Hypercholesterolemia   . Hyperglycemia 04/02/2014   Glucose 178 mg percent on 01/22/2014 04/05/14 Hgb A1c 6.6 Diet controlled.    . Hypothyroidism 04/15/2015  . Neuropathy    PERIPHERAL  . Pruritus   . Scoliosis   . Varicose veins    Past Surgical History:  Procedure Laterality Date  . ABDOMINAL HYSTERECTOMY  1954  . CHOLECYSTECTOMY  1997  . ESOPHAGOGASTRODUODENOSCOPY (EGD) WITH PROPOFOL N/A 11/25/2016   Procedure: ESOPHAGOGASTRODUODENOSCOPY (EGD) WITH PROPOFOL;  Surgeon: [name], MD;  Location: [facility] ENDOSCOPY;  Service: Endoscopy;  Laterality: N/A;  . [place]   correct scoliosis    reports that  has never smoked. she has never used smokeless tobacco. She reports that she does not drink alcohol or use drugs. Social History   Socioeconomic History  . Marital status: Widowed    Spouse name: Not on file  . Number of  children: 2  . Years of education: 12+  . Highest education level: Not on file  Social Needs  . Financial resource strain: Not on file  . Food insecurity - worry: Not on file  . Food insecurity - inability: Not on file  . Transportation needs - medical: Not  on file  . Transportation needs - non-medical: Not on file  Occupational History  . 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 Smoking status: Never Smoker  . Smokeless tobacco: Never Used  Substance and Sexual Activity  . Alcohol use: No    Alcohol/week: 0.0 oz  . Drug use: No  . Sexual activity: No    Birth control/protection: None  Other Topics Concern  . Not on file  Social History Narrative   Patient lives at [facility] 03/05/2014   Patient is a widowed.    Patient is retired.    Patient has no children but adopted twin sons.    Patient has a college degree.    Never smoked   Alcohol none   Exercise with therapy   Walks with cane   POA       Patient only drinks de-caf drinks.   Patient is right handed.    Functional Status Survey:    Family History  Adopted: Yes  Problem Relation Age of Onset  . Diabetes Mother   . Heart attack Mother   . Heart disease Mother        After age 9  . Heart attack Father   . Stroke Father   . Breast cancer Sister   . Heart disease Maternal Grandmother   . Cancer Son        adopted son. esophagus, stomach, liver  . Stomach cancer Neg Hx   . Colon cancer Neg Hx     Health Maintenance  Topic Date Due  . DEXA SCAN  01/11/2017 (Originally 01/31/1987)  . PNA vac Low Risk Adult (2 of 2 - PPSV23) 01/11/2017 (Originally 08/22/2014)  . TETANUS/TDAP  05/21/2018  . INFLUENZA VACCINE  Completed    No Known Allergies  Outpatient Encounter Medications as of 12/27/2016  Medication Sig  . acetaminophen (TYLENOL) 500 MG tablet Take 500 mg every 6 (six) hours as needed by mouth (for pain/back pain.).   Marland Kitchen antiseptic oral rinse (BIOTENE) LIQD 15 mLs by Mouth Rinse route as needed for dry mouth.  Marland Kitchen aspirin EC 81 MG tablet Take 81 mg daily by mouth.  Marland Kitchen atenolol (TENORMIN) 25 MG tablet Take 0.5 tablets (12.5 mg total) by mouth 2 (two) times daily.  . Calcium-Vitamin D-Vitamin K  (VIACTIV) [phone] MG-UNT-MCG CHEW Chew 1 tablet daily by mouth.  . DULoxetine (CYMBALTA) 20 MG capsule TAKE 1 CAPSULE AT BEDTIME.  . hydrogen peroxide 1.5 % SOLN 15 mLs as needed (swish and spit).  Marland Kitchen lactose free nutrition (BOOST) LIQD Take 237 mLs by mouth 3 (three) times daily between meals.  . multivitamin-iron-minerals-folic acid (CENTRUM) chewable tablet Chew 1 tablet daily by mouth.  . ondansetron (ZOFRAN) 4 MG tablet Take 1 tablet (4 mg total) by mouth every 6 (six) hours as needed for nausea.  Marland Kitchen oxyCODONE (OXY IR/ROXICODONE) 5 MG immediate release tablet Take 1 tablet (5 mg total) by mouth every 6 (six) hours as needed for severe pain.  . polyethylene glycol (MIRALAX / GLYCOLAX) packet Take 17 g daily as needed by mouth (for constipation).   . simvastatin (ZOCOR) 40 MG tablet Take 20 mg  at bedtime by mouth.   . traMADol (ULTRAM) 50 MG tablet Take 1 tablet (50 mg total) by mouth every 6 (six) hours as needed for moderate pain.  Marland Kitchen UNABLE TO FIND Med Name: Biotin 5 mg tablet by mouth daily  . [DISCONTINUED] Biotin 5000 MCG CAPS Take 5,000 mcg daily by mouth.   . [DISCONTINUED] neomycin-bacitracin-polymyxin (NEOSPORIN) OINT Apply 1 application topically 3 (three) times daily.   No facility-administered encounter medications on file as of 12/27/2016.     Review of Systems  Constitutional: Negative for appetite change, chills, fatigue and fever.  HENT: Positive for hearing loss and trouble swallowing. Negative for congestion, mouth sores, sinus pressure and sore throat.   Eyes: Negative for pain, discharge and visual disturbance.  Respiratory: Positive for choking. Negative for cough and shortness of breath.   Cardiovascular: Negative for chest pain, palpitations and leg swelling.  Gastrointestinal: Negative for abdominal pain, blood in stool, constipation, diarrhea, nausea and vomiting.  Genitourinary: Positive for frequency. Negative for dysuria.  Musculoskeletal: Positive for gait  problem. Negative for back pain.  Skin: Negative for rash and wound.  Neurological: Negative for dizziness, numbness and headaches.  Psychiatric/Behavioral: Negative for behavioral problems and confusion.    Vitals:   12/27/16 1155  BP: 130/72  Pulse: 78  Resp: 20  Temp: (!) 96.2 F (35.7 C)  TempSrc: Oral  SpO2: 97%  Weight: 92 lb (41.7 kg)  Height: 5\' 2"  (1.575 m)   Body mass index is 16.83 kg/m.   Wt Readings from Last 3 Encounters:  12/27/16 92 lb (41.7 kg)  12/22/16 92 lb (41.7 kg)  12/09/16 91 lb 6.4 oz (41.5 kg)   Physical Exam  Constitutional: She is oriented to person, place, and time.  Thin built, frail, in no acute distress  HENT:  Head: Normocephalic and atraumatic.  Mouth/Throat: Oropharynx is clear and moist. No oropharyngeal exudate.  Eyes: Conjunctivae and EOM are normal. Right eye exhibits no discharge. Left eye exhibits no discharge.  Neck: Normal range of motion. Neck supple.  Cardiovascular:  Irregular heart rate  Pulmonary/Chest: Effort normal and breath sounds normal. No respiratory distress. She has no wheezes. She has no rales.  Abdominal: Soft. Bowel sounds are normal. There is no tenderness. There is no guarding.  Musculoskeletal: She exhibits deformity. She exhibits no edema.  Arthritis changes to her fingers. Unsteady gait. Has a walker to ambulate  Lymphadenopathy:    She has no cervical adenopathy.  Neurological: She is alert and oriented to person, place, and time.  Skin: Skin is warm and dry. No rash noted. She is not diaphoretic.  Psychiatric: She has a normal mood and affect.    Labs reviewed: Basic Metabolic Panel: Recent Labs    12/02/16 0511 12/04/16 0220 12/07/16 0750 12/16/16  NA 138 135 135 137  K 4.2 4.2 3.9 4.8  CL 103 104 102  --   CO2 28 25 26   --   GLUCOSE 110* 89 100*  --   BUN 15 10 13 17   CREATININE 0.72 0.65 0.72 0.8  CALCIUM 8.7* 8.5* 8.9  --   MG 1.9  --  2.1  --    Liver Function Tests: Recent Labs     05/12/16 1304 07/01/16 07/29/16 0740 12/04/16 0220  AST  --  22 19 24   ALT  --  11 9 13*  ALKPHOS  --  101 80 75  BILITOT  --   --  0.6 0.6  PROT 6.5  --  5.7* 5.5*  ALBUMIN  --   --  3.2* 2.7*   No results for input(s): LIPASE, AMYLASE in the last 8760 hours. No results for input(s): AMMONIA in the last 8760 hours. CBC: Recent Labs    12/01/16 1904 12/02/16 0511 12/04/16 0220 12/16/16  WBC 8.4 11.1* 9.9 6.9  NEUTROABS 6.3 8.0*  --   --   HGB 13.8 11.5* 11.6* 11.8*  HCT 42.0 35.1* 34.6* 34*  MCV 98.4 98.3 98.0  --   PLT 221 199 213 276   Cardiac Enzymes: Recent Labs    12/02/16 0511  TROPONINI 0.04*   BNP: Invalid input(s): POCBNP Lab Results  Component Value Date   HGBA1C 6.4 07/07/2015   Lab Results  Component Value Date   TSH 1.272 12/02/2016   Lab Results  Component Value Date   [id number] 588 05/12/2016   No results found for: FOLATE No results found for: IRON, TIBC, FERRITIN  Imaging and Procedures obtained prior to SNF admission: Dg Chest 2 View  Result Date: 12/01/2016 CLINICAL DATA:  Fall in parking lot today. EXAM: CHEST  2 VIEW COMPARISON:  11/16/2013 FINDINGS: The heart size and mediastinal contours are within normal limits. Aortic atherosclerosis. Changes of COPD again noted. Both lungs are clear. No evidence of pneumothorax or pleural effusion. Several old left rib fracture deformities are again noted. Lumbar spine fusion hardware and scoliosis again noted. IMPRESSION: COPD.  No active cardiopulmonary disease. Electronically Signed   By: Earle Gell M.D.   On: 12/01/2016 20:54   Dg Forearm Left  Result Date: 12/01/2016 CLINICAL DATA:  Fall in parking lot today. Left forearm pain and bruising. Initial encounter. EXAM: LEFT FOREARM - 2 VIEW COMPARISON:  None. FINDINGS: There is no evidence of fracture or other focal bone lesions. Generalized osteopenia noted. Moderate soft tissue swelling is seen involving the mid distal forearm. IMPRESSION:  Moderate soft tissue swelling. No evidence of fracture or dislocation. Electronically Signed   By: Earle Gell M.D.   On: 12/01/2016 21:00   Ct Head Wo Contrast  Result Date: 12/01/2016 CLINICAL DATA:  Initial evaluation for acute trauma, fall. EXAM: CT HEAD WITHOUT CONTRAST CT MAXILLOFACIAL WITHOUT CONTRAST CT CERVICAL SPINE WITHOUT CONTRAST TECHNIQUE: Multidetector CT imaging of the head, cervical spine, and maxillofacial structures were performed using the standard protocol without intravenous contrast. Multiplanar CT image reconstructions of the cervical spine and maxillofacial structures were also generated. COMPARISON:  Prior CT from 01/23/2016. FINDINGS: CT HEAD FINDINGS Brain: Generalized age-related cerebral atrophy with moderate chronic small vessel ischemic disease. No acute intracranial hemorrhage. No acute large vessel territory infarct. No mass lesion, midline shift or mass effect. No hydrocephalus. No extra-axial fluid collection. Vascular: No hyperdense vessel. Scattered vascular calcifications noted within the carotid siphons. Skull: Large soft tissue contusion present at the forehead. Calvarium intact. Other: Mastoid air cells are clear. CT MAXILLOFACIAL FINDINGS Osseous: Zygomatic arches intact. No acute maxillary fracture. Pterygoid plates intact. There are acute minimally displaced bilateral nasal bone fractures. Nasal septum mildly bowed to the right but intact. Mandible intact. Mandibular condyles normally situated. No acute abnormality about the dentition. Few scattered dental caries noted. Orbits: Globes and orbital soft tissues within normal limits. Bony orbits intact. Sinuses: Scattered mucoperiosteal thickening present within the ethmoidal air cells. Paranasal sinuses are otherwise clear. No hemosinus. Soft tissues: Large soft tissue contusion at the forehead. Mild soft tissue swelling about the nose. CT CERVICAL SPINE FINDINGS Alignment: Straightening of the normal cervical  lordosis. Grade 1 anterolisthesis of C3,  C4 on C5, C7 on T1, T1 on T2, and T2 on T3. Trace retrolisthesis of C5 on C6. Findings are likely chronic and degenerative. Skull base and vertebrae: Skullbase intact. Normal C1-2 articulations are preserved in the dens is intact. Vertebral body heights maintained. No acute fracture. Soft tissues and spinal canal: No acute soft tissue abnormality within the neck. No abnormal prevertebral edema. Vascular calcifications present about the carotid bifurcations. Spinal canal within normal limits. Disc levels: Advanced degenerative spondylolysis at C5-6 and C6-7. Multilevel facet arthrosis with fusion and ankylosis, most notable within the upper cervical spine. Pronounced degenerative thickening of the tectorial membrane noted. Upper chest: Visualized upper chest within normal limits. Irregular biapical pleuroparenchymal thickening noted. Partially visualized lungs are otherwise grossly clear. Other: None. IMPRESSION: CT HEAD: 1. No acute intracranial process. 2. Large soft tissue contusion at the forehead. No calvarial fracture. 3. Age related cerebral atrophy with moderate chronic small vessel ischemic disease. CT MAXILLOFACIAL: 1. Acute minimally displaced bilateral nasal bone fractures. 2. No other acute maxillofacial injury. CT CERVICAL SPINE: 1. No acute traumatic injury within the cervical spine. 2. Advanced multilevel degenerative spondylolysis, most notable at C5-6 and C6-7. Electronically Signed   By: Jeannine Boga M.D.   On: 12/01/2016 20:47   Ct Cervical Spine Wo Contrast  Result Date: 12/01/2016 CLINICAL DATA:  Initial evaluation for acute trauma, fall. EXAM: CT HEAD WITHOUT CONTRAST CT MAXILLOFACIAL WITHOUT CONTRAST CT CERVICAL SPINE WITHOUT CONTRAST TECHNIQUE: Multidetector CT imaging of the head, cervical spine, and maxillofacial structures were performed using the standard protocol without intravenous contrast. Multiplanar CT image reconstructions of  the cervical spine and maxillofacial structures were also generated. COMPARISON:  Prior CT from 01/23/2016. FINDINGS: CT HEAD FINDINGS Brain: Generalized age-related cerebral atrophy with moderate chronic small vessel ischemic disease. No acute intracranial hemorrhage. No acute large vessel territory infarct. No mass lesion, midline shift or mass effect. No hydrocephalus. No extra-axial fluid collection. Vascular: No hyperdense vessel. Scattered vascular calcifications noted within the carotid siphons. Skull: Large soft tissue contusion present at the forehead. Calvarium intact. Other: Mastoid air cells are clear. CT MAXILLOFACIAL FINDINGS Osseous: Zygomatic arches intact. No acute maxillary fracture. Pterygoid plates intact. There are acute minimally displaced bilateral nasal bone fractures. Nasal septum mildly bowed to the right but intact. Mandible intact. Mandibular condyles normally situated. No acute abnormality about the dentition. Few scattered dental caries noted. Orbits: Globes and orbital soft tissues within normal limits. Bony orbits intact. Sinuses: Scattered mucoperiosteal thickening present within the ethmoidal air cells. Paranasal sinuses are otherwise clear. No hemosinus. Soft tissues: Large soft tissue contusion at the forehead. Mild soft tissue swelling about the nose. CT CERVICAL SPINE FINDINGS Alignment: Straightening of the normal cervical lordosis. Grade 1 anterolisthesis of C3, C4 on C5, C7 on T1, T1 on T2, and T2 on T3. Trace retrolisthesis of C5 on C6. Findings are likely chronic and degenerative. Skull base and vertebrae: Skullbase intact. Normal C1-2 articulations are preserved in the dens is intact. Vertebral body heights maintained. No acute fracture. Soft tissues and spinal canal: No acute soft tissue abnormality within the neck. No abnormal prevertebral edema. Vascular calcifications present about the carotid bifurcations. Spinal canal within normal limits. Disc levels: Advanced  degenerative spondylolysis at C5-6 and C6-7. Multilevel facet arthrosis with fusion and ankylosis, most notable within the upper cervical spine. Pronounced degenerative thickening of the tectorial membrane noted. Upper chest: Visualized upper chest within normal limits. Irregular biapical pleuroparenchymal thickening noted. Partially visualized lungs are otherwise grossly clear. Other: None.  IMPRESSION: CT HEAD: 1. No acute intracranial process. 2. Large soft tissue contusion at the forehead. No calvarial fracture. 3. Age related cerebral atrophy with moderate chronic small vessel ischemic disease. CT MAXILLOFACIAL: 1. Acute minimally displaced bilateral nasal bone fractures. 2. No other acute maxillofacial injury. CT CERVICAL SPINE: 1. No acute traumatic injury within the cervical spine. 2. Advanced multilevel degenerative spondylolysis, most notable at C5-6 and C6-7. Electronically Signed   By: Jeannine Boga M.D.   On: 12/01/2016 20:47   Ct Knee Left Wo Contrast  Result Date: 12/02/2016 CLINICAL DATA:  Left knee pain after fall onto concrete wall walking. Knee trauma, tenderness or a fusion or can walk, initial exam. EXAM: CT OF THE LEFT KNEE WITHOUT CONTRAST TECHNIQUE: Multidetector CT imaging of the LEFT knee was performed according to the standard protocol. Multiplanar CT image reconstructions were also generated. COMPARISON:  Radiographs yesterday FINDINGS: Bones/Joint/Cartilage No fractures. The bones are osteoporotic. Age related tibial femoral joint space narrowing. No joint effusion. Ligaments Suboptimally assessed by CT. Muscles and Tendons No large intramuscular hematoma. Quadriceps and patellar tendons are intact. Soft tissues Soft tissue edema medially. Vascular calcifications incidentally noted. IMPRESSION: No acute fracture, no joint effusion. Bony under mineralization. Electronically Signed   By: Jeb Levering M.D.   On: 12/02/2016 02:09   Dg Pelvis Portable  Result Date:  12/02/2016 CLINICAL DATA:  Pain following fall EXAM: PORTABLE PELVIS 1-2 VIEWS COMPARISON:  None. FINDINGS: There is no evidence of pelvic fracture or dislocation. Joint spaces appear unremarkable. No erosive change. There is postoperative change in the lower lumbar and upper sacral regions. IMPRESSION: No fracture or dislocation. No appreciable arthropathy. Postoperative change in the lumbosacral region. Electronically Signed   By: Lowella Grip III M.D.   On: 12/02/2016 09:14   Dg Knee Complete 4 Views Left  Result Date: 12/01/2016 CLINICAL DATA:  Followup parking lot today. Knee pain and bruising. Initial encounter. EXAM: LEFT KNEE - COMPLETE 4+ VIEW COMPARISON:  None. FINDINGS: No evidence of fracture, dislocation, or joint effusion. No evidence of arthropathy or other focal bone abnormality. Generalized osteopenia noted. Mild peripheral vascular calcification noted. IMPRESSION: No acute findings.  Osteopenia. Electronically Signed   By: Earle Gell M.D.   On: 12/01/2016 20:59   Ct Maxillofacial Wo Cm  Result Date: 12/01/2016 CLINICAL DATA:  Initial evaluation for acute trauma, fall. EXAM: CT HEAD WITHOUT CONTRAST CT MAXILLOFACIAL WITHOUT CONTRAST CT CERVICAL SPINE WITHOUT CONTRAST TECHNIQUE: Multidetector CT imaging of the head, cervical spine, and maxillofacial structures were performed using the standard protocol without intravenous contrast. Multiplanar CT image reconstructions of the cervical spine and maxillofacial structures were also generated. COMPARISON:  Prior CT from 01/23/2016. FINDINGS: CT HEAD FINDINGS Brain: Generalized age-related cerebral atrophy with moderate chronic small vessel ischemic disease. No acute intracranial hemorrhage. No acute large vessel territory infarct. No mass lesion, midline shift or mass effect. No hydrocephalus. No extra-axial fluid collection. Vascular: No hyperdense vessel. Scattered vascular calcifications noted within the carotid siphons. Skull: Large  soft tissue contusion present at the forehead. Calvarium intact. Other: Mastoid air cells are clear. CT MAXILLOFACIAL FINDINGS Osseous: Zygomatic arches intact. No acute maxillary fracture. Pterygoid plates intact. There are acute minimally displaced bilateral nasal bone fractures. Nasal septum mildly bowed to the right but intact. Mandible intact. Mandibular condyles normally situated. No acute abnormality about the dentition. Few scattered dental caries noted. Orbits: Globes and orbital soft tissues within normal limits. Bony orbits intact. Sinuses: Scattered mucoperiosteal thickening present within the ethmoidal air cells.  Paranasal sinuses are otherwise clear. No hemosinus. Soft tissues: Large soft tissue contusion at the forehead. Mild soft tissue swelling about the nose. CT CERVICAL SPINE FINDINGS Alignment: Straightening of the normal cervical lordosis. Grade 1 anterolisthesis of C3, C4 on C5, C7 on T1, T1 on T2, and T2 on T3. Trace retrolisthesis of C5 on C6. Findings are likely chronic and degenerative. Skull base and vertebrae: Skullbase intact. Normal C1-2 articulations are preserved in the dens is intact. Vertebral body heights maintained. No acute fracture. Soft tissues and spinal canal: No acute soft tissue abnormality within the neck. No abnormal prevertebral edema. Vascular calcifications present about the carotid bifurcations. Spinal canal within normal limits. Disc levels: Advanced degenerative spondylolysis at C5-6 and C6-7. Multilevel facet arthrosis with fusion and ankylosis, most notable within the upper cervical spine. Pronounced degenerative thickening of the tectorial membrane noted. Upper chest: Visualized upper chest within normal limits. Irregular biapical pleuroparenchymal thickening noted. Partially visualized lungs are otherwise grossly clear. Other: None. IMPRESSION: CT HEAD: 1. No acute intracranial process. 2. Large soft tissue contusion at the forehead. No calvarial fracture. 3.  Age related cerebral atrophy with moderate chronic small vessel ischemic disease. CT MAXILLOFACIAL: 1. Acute minimally displaced bilateral nasal bone fractures. 2. No other acute maxillofacial injury. CT CERVICAL SPINE: 1. No acute traumatic injury within the cervical spine. 2. Advanced multilevel degenerative spondylolysis, most notable at C5-6 and C6-7. Electronically Signed   By: Jeannine Boga M.D.   On: 12/01/2016 20:47    Assessment/Plan  Dysphagia Aspiration precautions. SLP to follow. Chronic and stable with tight cricopharyngeus and zenker's. Continue dysphagia diet.   Protein calorie malnutrition Low BMI but stable weight. Monitor po intake. Continue feeding supplement.   Unsteady gait Remains a fall risk. Ambulating now with a walker.   afib Controlled HR. Continue atenolol 12.5 mg bid ad aspirin 81 mg daily. Not on anticoagulation with bleeding risk.   Sciatica Tolerable pain. Continue cymbalta for now.  Currently on tylenol 500 mg q6h prn pain and tramadol 50 mg q6h prn pain with oxycodone IR 5 mg q6h prn. She has been taking all three medication. Will schedule tylenol 500 mg qid. Tramadol for breakthrough for pain 1-5/10 and oxyIR for pain 6-10/10. Reassess in 2-3 weeks.   Hyperlipidemia Continue zocor for now Lipid Panel     Component Value Date/Time   CHOL 152 07/29/2016 0740   TRIG 77 07/29/2016 0740   HDL 74 07/29/2016 0740   CHOLHDL 2.1 07/29/2016 0740   VLDL 15 07/29/2016 0740   LDLCALC 63 07/29/2016 0740    Family/ staff Communication: reviewed care plan with patient and charge nurse.    Labs/tests ordered: none  Blanchie Serve, MD Internal Medicine [facility] [address] [place], [place] [address] Cell Phone (Monday-Friday 8 am - 5 pm): [phone] On Call: [phone] and follow prompts after 5 pm and on weekends Office Phone: [phone] Office Fax: [phone]

## 2016-12-28 DIAGNOSIS — M6281 Muscle weakness (generalized): Secondary | ICD-10-CM | POA: Diagnosis not present

## 2016-12-28 DIAGNOSIS — R296 Repeated falls: Secondary | ICD-10-CM | POA: Diagnosis not present

## 2016-12-28 DIAGNOSIS — Z9181 History of falling: Secondary | ICD-10-CM | POA: Diagnosis not present

## 2016-12-28 DIAGNOSIS — R1312 Dysphagia, oropharyngeal phase: Secondary | ICD-10-CM | POA: Diagnosis not present

## 2016-12-28 DIAGNOSIS — R29898 Other symptoms and signs involving the musculoskeletal system: Secondary | ICD-10-CM | POA: Diagnosis not present

## 2016-12-28 DIAGNOSIS — R2681 Unsteadiness on feet: Secondary | ICD-10-CM | POA: Diagnosis not present

## 2016-12-29 DIAGNOSIS — M6281 Muscle weakness (generalized): Secondary | ICD-10-CM | POA: Diagnosis not present

## 2016-12-29 DIAGNOSIS — R296 Repeated falls: Secondary | ICD-10-CM | POA: Diagnosis not present

## 2016-12-29 DIAGNOSIS — R2681 Unsteadiness on feet: Secondary | ICD-10-CM | POA: Diagnosis not present

## 2016-12-29 DIAGNOSIS — Z9181 History of falling: Secondary | ICD-10-CM | POA: Diagnosis not present

## 2016-12-29 DIAGNOSIS — R29898 Other symptoms and signs involving the musculoskeletal system: Secondary | ICD-10-CM | POA: Diagnosis not present

## 2016-12-29 DIAGNOSIS — R1312 Dysphagia, oropharyngeal phase: Secondary | ICD-10-CM | POA: Diagnosis not present

## 2016-12-30 DIAGNOSIS — R29898 Other symptoms and signs involving the musculoskeletal system: Secondary | ICD-10-CM | POA: Diagnosis not present

## 2016-12-30 DIAGNOSIS — Z961 Presence of intraocular lens: Secondary | ICD-10-CM | POA: Diagnosis not present

## 2016-12-30 DIAGNOSIS — R2681 Unsteadiness on feet: Secondary | ICD-10-CM | POA: Diagnosis not present

## 2016-12-30 DIAGNOSIS — R1312 Dysphagia, oropharyngeal phase: Secondary | ICD-10-CM | POA: Diagnosis not present

## 2016-12-30 DIAGNOSIS — M6281 Muscle weakness (generalized): Secondary | ICD-10-CM | POA: Diagnosis not present

## 2016-12-30 DIAGNOSIS — R296 Repeated falls: Secondary | ICD-10-CM | POA: Diagnosis not present

## 2016-12-30 DIAGNOSIS — D3131 Benign neoplasm of right choroid: Secondary | ICD-10-CM | POA: Diagnosis not present

## 2016-12-30 DIAGNOSIS — Z9181 History of falling: Secondary | ICD-10-CM | POA: Diagnosis not present

## 2016-12-31 DIAGNOSIS — R296 Repeated falls: Secondary | ICD-10-CM | POA: Diagnosis not present

## 2016-12-31 DIAGNOSIS — R2681 Unsteadiness on feet: Secondary | ICD-10-CM | POA: Diagnosis not present

## 2016-12-31 DIAGNOSIS — Z9181 History of falling: Secondary | ICD-10-CM | POA: Diagnosis not present

## 2016-12-31 DIAGNOSIS — R1312 Dysphagia, oropharyngeal phase: Secondary | ICD-10-CM | POA: Diagnosis not present

## 2016-12-31 DIAGNOSIS — R29898 Other symptoms and signs involving the musculoskeletal system: Secondary | ICD-10-CM | POA: Diagnosis not present

## 2016-12-31 DIAGNOSIS — M6281 Muscle weakness (generalized): Secondary | ICD-10-CM | POA: Diagnosis not present

## 2017-01-03 DIAGNOSIS — S022XXA Fracture of nasal bones, initial encounter for closed fracture: Secondary | ICD-10-CM | POA: Diagnosis not present

## 2017-01-10 DIAGNOSIS — R296 Repeated falls: Secondary | ICD-10-CM | POA: Diagnosis not present

## 2017-01-10 DIAGNOSIS — M6281 Muscle weakness (generalized): Secondary | ICD-10-CM | POA: Diagnosis not present

## 2017-01-10 DIAGNOSIS — R29898 Other symptoms and signs involving the musculoskeletal system: Secondary | ICD-10-CM | POA: Diagnosis not present

## 2017-01-10 DIAGNOSIS — R2681 Unsteadiness on feet: Secondary | ICD-10-CM | POA: Diagnosis not present

## 2017-01-10 DIAGNOSIS — R1312 Dysphagia, oropharyngeal phase: Secondary | ICD-10-CM | POA: Diagnosis not present

## 2017-01-10 DIAGNOSIS — Z9181 History of falling: Secondary | ICD-10-CM | POA: Diagnosis not present

## 2017-01-12 DIAGNOSIS — Z9181 History of falling: Secondary | ICD-10-CM | POA: Diagnosis not present

## 2017-01-12 DIAGNOSIS — M6281 Muscle weakness (generalized): Secondary | ICD-10-CM | POA: Diagnosis not present

## 2017-01-12 DIAGNOSIS — R1312 Dysphagia, oropharyngeal phase: Secondary | ICD-10-CM | POA: Diagnosis not present

## 2017-01-12 DIAGNOSIS — R296 Repeated falls: Secondary | ICD-10-CM | POA: Diagnosis not present

## 2017-01-12 DIAGNOSIS — R29898 Other symptoms and signs involving the musculoskeletal system: Secondary | ICD-10-CM | POA: Diagnosis not present

## 2017-01-12 DIAGNOSIS — R2681 Unsteadiness on feet: Secondary | ICD-10-CM | POA: Diagnosis not present

## 2017-01-13 DIAGNOSIS — Z9181 History of falling: Secondary | ICD-10-CM | POA: Diagnosis not present

## 2017-01-13 DIAGNOSIS — R296 Repeated falls: Secondary | ICD-10-CM | POA: Diagnosis not present

## 2017-01-13 DIAGNOSIS — M6281 Muscle weakness (generalized): Secondary | ICD-10-CM | POA: Diagnosis not present

## 2017-01-13 DIAGNOSIS — R2681 Unsteadiness on feet: Secondary | ICD-10-CM | POA: Diagnosis not present

## 2017-01-13 DIAGNOSIS — R1312 Dysphagia, oropharyngeal phase: Secondary | ICD-10-CM | POA: Diagnosis not present

## 2017-01-13 DIAGNOSIS — R29898 Other symptoms and signs involving the musculoskeletal system: Secondary | ICD-10-CM | POA: Diagnosis not present

## 2017-01-14 DIAGNOSIS — R29898 Other symptoms and signs involving the musculoskeletal system: Secondary | ICD-10-CM | POA: Diagnosis not present

## 2017-01-14 DIAGNOSIS — R2681 Unsteadiness on feet: Secondary | ICD-10-CM | POA: Diagnosis not present

## 2017-01-14 DIAGNOSIS — R296 Repeated falls: Secondary | ICD-10-CM | POA: Diagnosis not present

## 2017-01-14 DIAGNOSIS — Z9181 History of falling: Secondary | ICD-10-CM | POA: Diagnosis not present

## 2017-01-14 DIAGNOSIS — R1312 Dysphagia, oropharyngeal phase: Secondary | ICD-10-CM | POA: Diagnosis not present

## 2017-01-14 DIAGNOSIS — M6281 Muscle weakness (generalized): Secondary | ICD-10-CM | POA: Diagnosis not present

## 2017-01-17 DIAGNOSIS — R29898 Other symptoms and signs involving the musculoskeletal system: Secondary | ICD-10-CM | POA: Diagnosis not present

## 2017-01-17 DIAGNOSIS — R1312 Dysphagia, oropharyngeal phase: Secondary | ICD-10-CM | POA: Diagnosis not present

## 2017-01-17 DIAGNOSIS — M6281 Muscle weakness (generalized): Secondary | ICD-10-CM | POA: Diagnosis not present

## 2017-01-17 DIAGNOSIS — R2681 Unsteadiness on feet: Secondary | ICD-10-CM | POA: Diagnosis not present

## 2017-01-17 DIAGNOSIS — R296 Repeated falls: Secondary | ICD-10-CM | POA: Diagnosis not present

## 2017-01-17 DIAGNOSIS — Z9181 History of falling: Secondary | ICD-10-CM | POA: Diagnosis not present

## 2017-01-18 DIAGNOSIS — R29898 Other symptoms and signs involving the musculoskeletal system: Secondary | ICD-10-CM | POA: Diagnosis not present

## 2017-01-18 DIAGNOSIS — R2681 Unsteadiness on feet: Secondary | ICD-10-CM | POA: Diagnosis not present

## 2017-01-18 DIAGNOSIS — M6281 Muscle weakness (generalized): Secondary | ICD-10-CM | POA: Diagnosis not present

## 2017-01-18 DIAGNOSIS — R1312 Dysphagia, oropharyngeal phase: Secondary | ICD-10-CM | POA: Diagnosis not present

## 2017-01-18 DIAGNOSIS — R296 Repeated falls: Secondary | ICD-10-CM | POA: Diagnosis not present

## 2017-01-18 DIAGNOSIS — Z9181 History of falling: Secondary | ICD-10-CM | POA: Diagnosis not present

## 2017-01-19 DIAGNOSIS — Z9181 History of falling: Secondary | ICD-10-CM | POA: Diagnosis not present

## 2017-01-19 DIAGNOSIS — R29898 Other symptoms and signs involving the musculoskeletal system: Secondary | ICD-10-CM | POA: Diagnosis not present

## 2017-01-19 DIAGNOSIS — M6281 Muscle weakness (generalized): Secondary | ICD-10-CM | POA: Diagnosis not present

## 2017-01-19 DIAGNOSIS — R296 Repeated falls: Secondary | ICD-10-CM | POA: Diagnosis not present

## 2017-01-19 DIAGNOSIS — R2681 Unsteadiness on feet: Secondary | ICD-10-CM | POA: Diagnosis not present

## 2017-01-19 DIAGNOSIS — R1312 Dysphagia, oropharyngeal phase: Secondary | ICD-10-CM | POA: Diagnosis not present

## 2017-01-20 ENCOUNTER — Encounter: Payer: Self-pay | Admitting: Family

## 2017-01-20 ENCOUNTER — Non-Acute Institutional Stay: Payer: Medicare Other | Admitting: Family

## 2017-01-20 DIAGNOSIS — M79605 Pain in left leg: Secondary | ICD-10-CM

## 2017-01-20 NOTE — Progress Notes (Signed)
Location:  [place] Room Number: 1 Place of Service:  ALF [phone]) Provider: Mackie Goon FNP-C  Blanchie Serve, MD  Patient Care Team: Blanchie Serve, MD as PCP - General (Internal Medicine) Angelia Mould, MD as Attending Physician (Vascular Surgery) [name], MD as Attending Physician (Obstetrics and Gynecology) [name], MD (Orthopedic Surgery) [name], MD (Ophthalmology) [name], MD as Consulting Physician (Urology) [name], MD as Consulting Physician (Cardiology) [name], PA-C as Physician Assistant (Otolaryngology) [name], MD as Consulting Physician (Otolaryngology) [name], MD as Consulting Physician (Dermatology) [name], MD as Consulting Physician (Physical Medicine and Rehabilitation) [name], MD as Attending Physician (Gastroenterology) Mast, Man X, NP as Nurse Practitioner (Internal Medicine) Kathrynn Ducking, MD as Consulting Physician (Neurology) Debara Pickett Nadean Corwin, MD as Consulting Physician (Cardiology) Druscilla Brownie, MD as Consulting Physician (Dermatology)  Extended Emergency Contact Information Primary Emergency Contact: [name] of [name] Phone: [phone] Mobile Phone: [phone] Relation: Son  Code Status:  DNR Goals of care: Advanced Directive information Advanced Directives 01/20/2017  Does Patient Have a Medical Advance Directive? Yes  Type of Paramedic of [place];Living will  Does patient want to make changes to medical advance directive? -  Copy of [place] in Chart? Yes  Would patient like information on creating a medical advance directive? -     Chief Complaint  Patient presents with  . Acute Visit    leg swelling    HPI:  Pt is a 84 y.o. female seen today at [facility] for an acute visit for evaluation of left leg pain and swelling.she is seen in her room  today.she complains of left lower leg swelling and pain.She thinks might have been from last fall episode one month ago.Leg painful with touching but no pain when walking.she has noticed redness above ankle area.Redness does not seem to be getting worse or " better".She denies fever,chills,sprain or insect bite to area.     Past Medical History:  Diagnosis Date  . Abdominal bloating   . Atrial fibrillation ([place])   . Bruises easily   . Carotid artery occlusion    LEFT  . Dizziness   . DOE (dyspnea on exertion) 04/02/2014  . Fall at home Sept 2013, Dec. 2013  Jun 08, 2012  . GERD (gastroesophageal reflux disease) 02/11/2015  . Headache(784.0)   . Hoarseness   . Hypercholesterolemia   . Hyperglycemia 04/02/2014   Glucose 178 mg percent on 01/22/2014 04/05/14 Hgb A1c 6.6 Diet controlled.    . Hypothyroidism 04/15/2015  . Neuropathy    PERIPHERAL  . Pruritus   . Scoliosis   . Varicose veins    Past Surgical History:  Procedure Laterality Date  . ABDOMINAL HYSTERECTOMY  1954  . CHOLECYSTECTOMY  1997  . ESOPHAGOGASTRODUODENOSCOPY (EGD) WITH PROPOFOL N/A 11/25/2016   Procedure: ESOPHAGOGASTRODUODENOSCOPY (EGD) WITH PROPOFOL;  Surgeon: [name], MD;  Location: [facility];  Service: Endoscopy;  Laterality: N/A;  . SPINE SURGERY  1997   correct scoliosis    No Known Allergies  Outpatient Encounter Medications as of 01/20/2017  Medication Sig  . acetaminophen (TYLENOL) 500 MG tablet Take 500 mg every 6 (six) hours as needed by mouth (for pain/back pain.).   Marland Kitchen antiseptic oral rinse (BIOTENE) LIQD 15 mLs by Mouth Rinse route as needed for dry mouth.  Marland Kitchen aspirin EC 81 MG tablet Take 81 mg daily by mouth.  Marland Kitchen atenolol (TENORMIN) 25  MG tablet Take 0.5 tablets (12.5 mg total) by mouth 2 (two) times daily.  . Calcium-Vitamin D-Vitamin K (VIACTIV) [id number] MG-UNT-MCG CHEW Chew 1 tablet daily by mouth.  . DULoxetine (CYMBALTA) 20 MG capsule TAKE 1 CAPSULE AT BEDTIME.  . fluticasone  (FLONASE) 50 MCG/ACT nasal spray Place 2 sprays into both nostrils daily.  . hydrogen peroxide 1.5 % SOLN 15 mLs as needed (swish and spit).  Marland Kitchen lactose free nutrition (BOOST) LIQD Take 237 mLs by mouth 3 (three) times daily between meals.  . multivitamin-iron-minerals-folic acid (CENTRUM) chewable tablet Chew 1 tablet daily by mouth.  . ondansetron (ZOFRAN) 4 MG tablet Take 1 tablet (4 mg total) by mouth every 6 (six) hours as needed for nausea.  Marland Kitchen oxyCODONE (OXY IR/ROXICODONE) 5 MG immediate release tablet Take 1 tablet (5 mg total) by mouth every 6 (six) hours as needed for severe pain.  . polyethylene glycol (MIRALAX / GLYCOLAX) packet Take 17 g daily as needed by mouth (for constipation).   . saccharomyces boulardii (FLORASTOR) 250 MG capsule Take 250 mg by mouth 2 (two) times daily.  . simvastatin (ZOCOR) 40 MG tablet Take 20 mg at bedtime by mouth.   . traMADol (ULTRAM) 50 MG tablet Take 1 tablet (50 mg total) by mouth every 6 (six) hours as needed for moderate pain.  Marland Kitchen UNABLE TO FIND Med Name: Biotin 5 mg tablet by mouth daily   No facility-administered encounter medications on file as of 01/20/2017.     Review of Systems  Constitutional: Negative for chills, fatigue and fever.  HENT: Positive for hearing loss. Negative for congestion, sneezing and sore throat.   Eyes: Negative for redness and itching.       Wears eye glasses   Respiratory: Negative for cough, chest tightness, shortness of breath and wheezing.   Cardiovascular: Negative for chest pain and palpitations.       Left leg localized diffuse swelling   Gastrointestinal: Negative for abdominal distention, abdominal pain, constipation, nausea and vomiting.  Musculoskeletal: Positive for gait problem. Negative for joint swelling, myalgias and neck stiffness.       Chronic arthritic pain   Skin: Negative for color change, pallor, rash and wound.       Leg redness as above   Neurological: Negative for dizziness,  light-headedness and headaches.  Hematological: Does not bruise/bleed easily.  Psychiatric/Behavioral: Negative for agitation, confusion and sleep disturbance.    Immunization History  Administered Date(s) Administered  . Influenza Split 10/20/2011, 11/03/2012  . Influenza,inj,Quad PF,6+ Mos 09/29/2012, 11/09/2013  . Influenza-Unspecified 10/17/2014, 10/23/2015, 10/20/2016  . PPD Test 08/21/2013, 03/04/2014  . Pneumococcal Conjugate-13 08/21/2013  . Tdap 05/20/2008  . Zoster 11/01/2012   Pertinent  Health Maintenance Due  Topic Date Due  . DEXA SCAN  01/31/1987  . PNA vac Low Risk Adult (2 of 2 - PPSV23) 08/22/2014  . INFLUENZA VACCINE  Completed   Fall Risk  09/22/2016 08/25/2016 05/31/2016 09/02/2015 04/29/2015  Falls in the past year? Yes Yes No No No  Number falls in past yr: 1 2 or more - - -  Comment - - - - -  Injury with Fall? Yes Yes - - -  Comment fx 3 ribs - - - -  Risk Factor Category  - - - - -  Risk for fall due to : Impaired mobility;Impaired balance/gait - - - -  Risk for fall due to: Comment - - - - -   Functional Status Survey:    Vitals:  01/20/17 1332  BP: (!) 136/52  Pulse: 87  Resp: 18  Temp: 97.8 F (36.6 C)  SpO2: 90%  Weight: 94 lb 9.6 oz (42.9 kg)  Height: 5\' 2"  (1.575 m)   Body mass index is 17.3 kg/m. Physical Exam  Constitutional: She is oriented to person, place, and time.  Thin frail elderly in no acute distress   HENT:  Head: Normocephalic.  Mouth/Throat: Oropharynx is clear and moist. No oropharyngeal exudate.  Eyes: Conjunctivae and EOM are normal. Pupils are equal, round, and reactive to light. Right eye exhibits no discharge. Left eye exhibits no discharge. No scleral icterus.  Eye glasses in place   Neck: Normal range of motion. No JVD present. No thyromegaly present.  Cardiovascular: Intact distal pulses. Exam reveals no gallop and no friction rub.  Murmur heard. Varicose veins   Pulmonary/Chest: Effort normal and breath  sounds normal. No respiratory distress. She has no wheezes. She has no rales.  Abdominal: Soft. Bowel sounds are normal. She exhibits no distension. There is no tenderness. There is no rebound and no guarding.  Musculoskeletal: She exhibits no edema or tenderness.  Unsteady gait uses walker.  Lymphadenopathy:    She has no cervical adenopathy.  Neurological: She is oriented to person, place, and time. Coordination normal.  Skin: Skin is warm. No rash noted.  Left lateral lower leg skin slight redness and tender to touch with slight localized diffused swelling.Skin temperature not hot compared to other leg areas.ROM without any difficulties   Psychiatric: She has a normal mood and affect.   Labs reviewed: Recent Labs    12/02/16 0511 12/04/16 0220 12/07/16 0750 12/16/16  NA 138 135 135 137  K 4.2 4.2 3.9 4.8  CL 103 104 102  --   CO2 28 25 26   --   GLUCOSE 110* 89 100*  --   BUN 15 10 13 17   CREATININE 0.72 0.65 0.72 0.8  CALCIUM 8.7* 8.5* 8.9  --   MG 1.9  --  2.1  --    Recent Labs    05/12/16 1304 07/01/16 07/29/16 0740 12/04/16 0220  AST  --  22 19 24   ALT  --  11 9 13*  ALKPHOS  --  101 80 75  BILITOT  --   --  0.6 0.6  PROT 6.5  --  5.7* 5.5*  ALBUMIN  --   --  3.2* 2.7*   Recent Labs    12/01/16 1904 12/02/16 0511 12/04/16 0220 12/16/16  WBC 8.4 11.1* 9.9 6.9  NEUTROABS 6.3 8.0*  --   --   HGB 13.8 11.5* 11.6* 11.8*  HCT 42.0 35.1* 34.6* 34*  MCV 98.4 98.3 98.0  --   PLT 221 199 213 276   Lab Results  Component Value Date   TSH 1.272 12/02/2016   Lab Results  Component Value Date   HGBA1C 6.4 07/07/2015   Lab Results  Component Value Date   CHOL 152 07/29/2016   HDL 74 07/29/2016   LDLCALC 63 07/29/2016   TRIG 77 07/29/2016   CHOLHDL 2.1 07/29/2016    Significant Diagnostic Results in last 30 days:  No results found.  Assessment/Plan  Leg pain, lateral, left Afebrile.Left lateral lower leg skin slight redness and tender to touch with  slight localized diffused swelling.Skin temperature not hot compared to other leg areas.ROM without any difficulties.patient suspect possible from previous fall though this is unlike due to localized area.suspect possible insect bite.will monitor Temp curve.Notify provider if  redness worsen or not resolved.continue on tylenol for pain.   Family/ staff Communication: Reviewed plan of care with patient and facility Nurse.   Labs/tests ordered: None   Lleyton Byers C Jefferey Lippmann, NP

## 2017-01-21 DIAGNOSIS — R29898 Other symptoms and signs involving the musculoskeletal system: Secondary | ICD-10-CM | POA: Diagnosis not present

## 2017-01-21 DIAGNOSIS — R296 Repeated falls: Secondary | ICD-10-CM | POA: Diagnosis not present

## 2017-01-21 DIAGNOSIS — Z9181 History of falling: Secondary | ICD-10-CM | POA: Diagnosis not present

## 2017-01-21 DIAGNOSIS — M6281 Muscle weakness (generalized): Secondary | ICD-10-CM | POA: Diagnosis not present

## 2017-01-21 DIAGNOSIS — R1312 Dysphagia, oropharyngeal phase: Secondary | ICD-10-CM | POA: Diagnosis not present

## 2017-01-21 DIAGNOSIS — R2681 Unsteadiness on feet: Secondary | ICD-10-CM | POA: Diagnosis not present

## 2017-01-24 DIAGNOSIS — R1312 Dysphagia, oropharyngeal phase: Secondary | ICD-10-CM | POA: Diagnosis not present

## 2017-01-24 DIAGNOSIS — Z9181 History of falling: Secondary | ICD-10-CM | POA: Diagnosis not present

## 2017-01-24 DIAGNOSIS — M6281 Muscle weakness (generalized): Secondary | ICD-10-CM | POA: Diagnosis not present

## 2017-01-24 DIAGNOSIS — R29898 Other symptoms and signs involving the musculoskeletal system: Secondary | ICD-10-CM | POA: Diagnosis not present

## 2017-01-24 DIAGNOSIS — R2681 Unsteadiness on feet: Secondary | ICD-10-CM | POA: Diagnosis not present

## 2017-01-24 DIAGNOSIS — R296 Repeated falls: Secondary | ICD-10-CM | POA: Diagnosis not present

## 2017-01-26 DIAGNOSIS — M6281 Muscle weakness (generalized): Secondary | ICD-10-CM | POA: Diagnosis not present

## 2017-01-26 DIAGNOSIS — Z9181 History of falling: Secondary | ICD-10-CM | POA: Diagnosis not present

## 2017-01-26 DIAGNOSIS — R296 Repeated falls: Secondary | ICD-10-CM | POA: Diagnosis not present

## 2017-01-26 DIAGNOSIS — R2681 Unsteadiness on feet: Secondary | ICD-10-CM | POA: Diagnosis not present

## 2017-01-26 DIAGNOSIS — R1312 Dysphagia, oropharyngeal phase: Secondary | ICD-10-CM | POA: Diagnosis not present

## 2017-01-26 DIAGNOSIS — R29898 Other symptoms and signs involving the musculoskeletal system: Secondary | ICD-10-CM | POA: Diagnosis not present

## 2017-01-28 DIAGNOSIS — R1312 Dysphagia, oropharyngeal phase: Secondary | ICD-10-CM | POA: Diagnosis not present

## 2017-01-28 DIAGNOSIS — Z9181 History of falling: Secondary | ICD-10-CM | POA: Diagnosis not present

## 2017-01-28 DIAGNOSIS — R296 Repeated falls: Secondary | ICD-10-CM | POA: Diagnosis not present

## 2017-01-28 DIAGNOSIS — R29898 Other symptoms and signs involving the musculoskeletal system: Secondary | ICD-10-CM | POA: Diagnosis not present

## 2017-01-28 DIAGNOSIS — R2681 Unsteadiness on feet: Secondary | ICD-10-CM | POA: Diagnosis not present

## 2017-01-28 DIAGNOSIS — M6281 Muscle weakness (generalized): Secondary | ICD-10-CM | POA: Diagnosis not present

## 2017-02-01 DIAGNOSIS — R296 Repeated falls: Secondary | ICD-10-CM | POA: Diagnosis not present

## 2017-02-01 DIAGNOSIS — R1312 Dysphagia, oropharyngeal phase: Secondary | ICD-10-CM | POA: Diagnosis not present

## 2017-02-01 DIAGNOSIS — R2681 Unsteadiness on feet: Secondary | ICD-10-CM | POA: Diagnosis not present

## 2017-02-01 DIAGNOSIS — R29898 Other symptoms and signs involving the musculoskeletal system: Secondary | ICD-10-CM | POA: Diagnosis not present

## 2017-02-01 DIAGNOSIS — Z9181 History of falling: Secondary | ICD-10-CM | POA: Diagnosis not present

## 2017-02-01 DIAGNOSIS — M6281 Muscle weakness (generalized): Secondary | ICD-10-CM | POA: Diagnosis not present

## 2017-02-03 ENCOUNTER — Encounter: Payer: Self-pay | Admitting: Family

## 2017-02-03 ENCOUNTER — Non-Acute Institutional Stay: Payer: Medicare Other | Admitting: Family

## 2017-02-03 DIAGNOSIS — L03116 Cellulitis of left lower limb: Secondary | ICD-10-CM | POA: Diagnosis not present

## 2017-02-03 MED ORDER — CEPHALEXIN 500 MG PO CAPS: 500.0000 mg | ORAL_CAPSULE | Freq: Three times a day (TID) | ORAL | 0 refills | Status: AC

## 2017-02-03 NOTE — Progress Notes (Signed)
Location:  [place] Room Number: 1 Place of Service:  ALF [phone]) Provider: Jalaiya Oyster FNP-C  Blanchie Serve, MD  Patient Care Team: Blanchie Serve, MD as PCP - General (Internal Medicine) Angelia Mould, MD as Attending Physician (Vascular Surgery) [name], MD as Attending Physician (Obstetrics and Gynecology) [name], MD (Orthopedic Surgery) [name], MD (Ophthalmology) [name], MD as Consulting Physician (Urology) [name], MD as Consulting Physician (Cardiology) [name], PA-C as Physician Assistant (Otolaryngology) [name], MD as Consulting Physician (Otolaryngology) [name], MD as Consulting Physician (Dermatology) [name], MD as Consulting Physician (Physical Medicine and Rehabilitation) [name], MD as Attending Physician (Gastroenterology) Mast, Man X, NP as Nurse Practitioner (Internal Medicine) Kathrynn Ducking, MD as Consulting Physician (Neurology) Debara Pickett Nadean Corwin, MD as Consulting Physician (Cardiology) Druscilla Brownie, MD as Consulting Physician (Dermatology)  Extended Emergency Contact Information Primary Emergency Contact: [name] of [place] Phone: [phone] Mobile Phone: [phone] Relation: Son  Code Status:  DNR Goals of care: Advanced Directive information Advanced Directives 02/03/2017  Does Patient Have a Medical Advance Directive? Yes  Type of Paramedic of [place];Living will  Does patient want to make changes to medical advance directive? -  Copy of [place] in Chart? Yes  Would patient like information on creating a medical advance directive? -     Chief Complaint  Patient presents with  . Acute Visit    left leg pain,swelling and redness     HPI:  Pt is a 84 y.o. female seen today at [facility] for an acute visit for evaluation of worsening left leg  redness,swelling and pain.she is seen in her room today with facility Nurse supervisor present at bedside.patient states previous left leg pain ,redness and swelling seems to be getting worst.Symptoms had improved but leg continued to be tender to touch.patient was examined two weeks ago and redness had improved.She denies any fever or chills. No injury to leg that she can recall.    Past Medical History:  Diagnosis Date  . Abdominal bloating   . Atrial fibrillation ([place])   . Bruises easily   . Carotid artery occlusion    LEFT  . Dizziness   . DOE (dyspnea on exertion) 04/02/2014  . Fall at home Sept 2013, Dec. 2013  Jun 08, 2012  . GERD (gastroesophageal reflux disease) 02/11/2015  . Headache(784.0)   . Hoarseness   . Hypercholesterolemia   . Hyperglycemia 04/02/2014   Glucose 178 mg percent on 01/22/2014 04/05/14 Hgb A1c 6.6 Diet controlled.    . Hypothyroidism 04/15/2015  . Neuropathy    PERIPHERAL  . Pruritus   . Scoliosis   . Varicose veins    Past Surgical History:  Procedure Laterality Date  . ABDOMINAL HYSTERECTOMY  1954  . CHOLECYSTECTOMY  1997  . ESOPHAGOGASTRODUODENOSCOPY (EGD) WITH PROPOFOL N/A 11/25/2016   Procedure: ESOPHAGOGASTRODUODENOSCOPY (EGD) WITH PROPOFOL;  Surgeon: [name], MD;  Location: [facility];  Service: Endoscopy;  Laterality: N/A;  . SPINE SURGERY  1997   correct scoliosis    No Known Allergies  Outpatient Encounter Medications as of 02/03/2017  Medication Sig  . acetaminophen (TYLENOL) 500 MG tablet Take 500 mg every 6 (six) hours as needed by mouth (for pain/back pain.).   Marland Kitchen antiseptic oral rinse (BIOTENE) LIQD 15 mLs by Mouth Rinse route as needed for dry mouth.  Marland Kitchen aspirin EC 81 MG tablet Take 81 mg daily by  mouth.  . atenolol (TENORMIN) 25 MG tablet Take 0.5 tablets (12.5 mg total) by mouth 2 (two) times daily.  . Calcium-Vitamin D-Vitamin K (VIACTIV) 202-542-70 MG-UNT-MCG CHEW Chew 1 tablet daily by mouth.  . DULoxetine (CYMBALTA) 20  MG capsule TAKE 1 CAPSULE AT BEDTIME.  . fluticasone (FLONASE) 50 MCG/ACT nasal spray Place 2 sprays into both nostrils daily.  . hydrogen peroxide 1.5 % SOLN 15 mLs as needed (swish and spit).  Marland Kitchen lactose free nutrition (BOOST) LIQD Take 237 mLs by mouth 3 (three) times daily between meals.  . multivitamin-iron-minerals-folic acid (CENTRUM) chewable tablet Chew 1 tablet daily by mouth.  . ondansetron (ZOFRAN) 4 MG tablet Take 1 tablet (4 mg total) by mouth every 6 (six) hours as needed for nausea.  Marland Kitchen oxyCODONE (OXY IR/ROXICODONE) 5 MG immediate release tablet Take 1 tablet (5 mg total) by mouth every 6 (six) hours as needed for severe pain.  . polyethylene glycol (MIRALAX / GLYCOLAX) packet Take 17 g daily as needed by mouth (for constipation).   . simvastatin (ZOCOR) 40 MG tablet Take 20 mg at bedtime by mouth.   . traMADol (ULTRAM) 50 MG tablet Take 1 tablet (50 mg total) by mouth every 6 (six) hours as needed for moderate pain.  Marland Kitchen UNABLE TO FIND Med Name: Biotin 5 mg tablet by mouth daily  . cephALEXin (KEFLEX) 500 MG capsule Take 1 capsule (500 mg total) by mouth 3 (three) times daily for 7 days.  . [DISCONTINUED] saccharomyces boulardii (FLORASTOR) 250 MG capsule Take 250 mg by mouth 2 (two) times daily.   No facility-administered encounter medications on file as of 02/03/2017.     Review of Systems  Constitutional: Negative for chills, fatigue and fever.  Respiratory: Negative for cough, chest tightness, shortness of breath and wheezing.   Cardiovascular: Positive for leg swelling. Negative for chest pain and palpitations.  Gastrointestinal: Negative for abdominal distention, abdominal pain, constipation, diarrhea, nausea and vomiting.  Musculoskeletal: Positive for gait problem.       Left lower leg tender to touch   Skin: Negative for pallor and rash.       Worsening redness to left leg  Neurological: Negative for dizziness, weakness, light-headedness and headaches.  Hematological:  Does not bruise/bleed easily.  Psychiatric/Behavioral: Negative for agitation, confusion and sleep disturbance. The patient is not nervous/anxious.     Immunization History  Administered Date(s) Administered  . Influenza Split 10/20/2011, 11/03/2012  . Influenza,inj,Quad PF,6+ Mos 09/29/2012, 11/09/2013  . Influenza-Unspecified 10/17/2014, 10/23/2015, 10/20/2016  . PPD Test 08/21/2013, 03/04/2014  . Pneumococcal Conjugate-13 08/21/2013  . Tdap 05/20/2008  . Zoster 11/01/2012   Pertinent  Health Maintenance Due  Topic Date Due  . DEXA SCAN  01/31/1987  . PNA vac Low Risk Adult (2 of 2 - PPSV23) 08/22/2014  . INFLUENZA VACCINE  Completed   Fall Risk  09/22/2016 08/25/2016 05/31/2016 09/02/2015 04/29/2015  Falls in the past year? Yes Yes No No No  Number falls in past yr: 1 2 or more - - -  Comment - - - - -  Injury with Fall? Yes Yes - - -  Comment fx 3 ribs - - - -  Risk Factor Category  - - - - -  Risk for fall due to : Impaired mobility;Impaired balance/gait - - - -  Risk for fall due to: Comment - - - - -   Functional Status Survey:    Vitals:   02/03/17 1204  BP: (!) 150/74  Pulse: 74  Resp: 16  Temp: 98.7 F (37.1 C)  SpO2: 99%  Weight: 94 lb 9.6 oz (42.9 kg)  Height: 5\' 2"  (1.575 m)   Body mass index is 17.3 kg/m. Physical Exam  Constitutional: She is oriented to person, place, and time.  Thin frail elderly in no acute distress   Cardiovascular: Intact distal pulses. Exam reveals no gallop and no friction rub.  Murmur heard. Pulmonary/Chest: Effort normal and breath sounds normal. No respiratory distress. She has no wheezes. She has no rales.  Abdominal: Soft. Bowel sounds are normal. She exhibits no distension. There is no tenderness. There is no rebound and no guarding.  Musculoskeletal:  Unsteady gait uses FWW.left lower leg localized swelling noted.Moves x 4 extremities without any difficulties.   Neurological: She is oriented to person, place, and time.  Coordination normal.  Skin: Skin is warm and dry. No rash noted.  Left lower leg skin redness,warm and tender to touch.   Psychiatric: She has a normal mood and affect.    Labs reviewed: Recent Labs    12/02/16 0511 12/04/16 0220 12/07/16 0750 12/16/16  NA 138 135 135 137  K 4.2 4.2 3.9 4.8  CL 103 104 102  --   CO2 28 25 26   --   GLUCOSE 110* 89 100*  --   BUN 15 10 13 17   CREATININE 0.72 0.65 0.72 0.8  CALCIUM 8.7* 8.5* 8.9  --   MG 1.9  --  2.1  --    Recent Labs    05/12/16 1304 07/01/16 07/29/16 0740 12/04/16 0220  AST  --  22 19 24   ALT  --  11 9 13*  ALKPHOS  --  101 80 75  BILITOT  --   --  0.6 0.6  PROT 6.5  --  5.7* 5.5*  ALBUMIN  --   --  3.2* 2.7*   Recent Labs    12/01/16 1904 12/02/16 0511 12/04/16 0220 12/16/16  WBC 8.4 11.1* 9.9 6.9  NEUTROABS 6.3 8.0*  --   --   HGB 13.8 11.5* 11.6* 11.8*  HCT 42.0 35.1* 34.6* 34*  MCV 98.4 98.3 98.0  --   PLT 221 199 213 276   Lab Results  Component Value Date   TSH 1.272 12/02/2016   Lab Results  Component Value Date   HGBA1C 6.4 07/07/2015   Lab Results  Component Value Date   CHOL 152 07/29/2016   HDL 74 07/29/2016   LDLCALC 63 07/29/2016   TRIG 77 07/29/2016   CHOLHDL 2.1 07/29/2016    Significant Diagnostic Results in last 30 days:  No results found.  Assessment/Plan  Cellulitis of left leg Afebrile.Left inner lateral lower leg redness has worsen compared to previous visit.Area localized swelling and continues to be tender to touch.ROM completed without difficulties.Will start on Keflex 500 mg Capsule three times daily x 7 days for a total of 21 doses.Florastor 250 mg capsule one by mouth twice daily x 10 days. Continue to monitor.    Family/ staff Communication: Reviewed plan of care with patient and facility Nurse supervisor  Labs/tests ordered: None   Sandrea Hughs, NP

## 2017-02-04 DIAGNOSIS — R1312 Dysphagia, oropharyngeal phase: Secondary | ICD-10-CM | POA: Diagnosis not present

## 2017-02-04 DIAGNOSIS — R296 Repeated falls: Secondary | ICD-10-CM | POA: Diagnosis not present

## 2017-02-04 DIAGNOSIS — R2681 Unsteadiness on feet: Secondary | ICD-10-CM | POA: Diagnosis not present

## 2017-02-04 DIAGNOSIS — R29898 Other symptoms and signs involving the musculoskeletal system: Secondary | ICD-10-CM | POA: Diagnosis not present

## 2017-02-04 DIAGNOSIS — M6281 Muscle weakness (generalized): Secondary | ICD-10-CM | POA: Diagnosis not present

## 2017-02-04 DIAGNOSIS — Z9181 History of falling: Secondary | ICD-10-CM | POA: Diagnosis not present

## 2017-02-05 ENCOUNTER — Encounter (HOSPITAL_COMMUNITY): Payer: Self-pay

## 2017-02-05 ENCOUNTER — Emergency Department (HOSPITAL_COMMUNITY): Payer: Medicare Other

## 2017-02-05 ENCOUNTER — Emergency Department (HOSPITAL_COMMUNITY)
Admission: EM | Admit: 2017-02-05 | Discharge: 2017-02-05 | Disposition: A | Discharge status: Home / Self Care | Payer: Medicare Other | Attending: Emergency Medicine | Admitting: Emergency Medicine

## 2017-02-05 DIAGNOSIS — Y999 Unspecified external cause status: Secondary | ICD-10-CM | POA: Diagnosis not present

## 2017-02-05 DIAGNOSIS — Y9389 Activity, other specified: Secondary | ICD-10-CM | POA: Diagnosis not present

## 2017-02-05 DIAGNOSIS — S0101XA Laceration without foreign body of scalp, initial encounter: Secondary | ICD-10-CM | POA: Diagnosis not present

## 2017-02-05 DIAGNOSIS — W010XXA Fall on same level from slipping, tripping and stumbling without subsequent striking against object, initial encounter: Secondary | ICD-10-CM | POA: Diagnosis not present

## 2017-02-05 DIAGNOSIS — Y92129 Unspecified place in nursing home as the place of occurrence of the external cause: Secondary | ICD-10-CM | POA: Diagnosis not present

## 2017-02-05 DIAGNOSIS — S0003XA Contusion of scalp, initial encounter: Secondary | ICD-10-CM | POA: Diagnosis not present

## 2017-02-05 DIAGNOSIS — I2510 Atherosclerotic heart disease of native coronary artery without angina pectoris: Secondary | ICD-10-CM | POA: Diagnosis not present

## 2017-02-05 DIAGNOSIS — Z7982 Long term (current) use of aspirin: Secondary | ICD-10-CM | POA: Diagnosis not present

## 2017-02-05 DIAGNOSIS — S199XXA Unspecified injury of neck, initial encounter: Secondary | ICD-10-CM | POA: Diagnosis not present

## 2017-02-05 DIAGNOSIS — R51 Headache: Secondary | ICD-10-CM | POA: Diagnosis not present

## 2017-02-05 DIAGNOSIS — E039 Hypothyroidism, unspecified: Secondary | ICD-10-CM | POA: Insufficient documentation

## 2017-02-05 DIAGNOSIS — Z79899 Other long term (current) drug therapy: Secondary | ICD-10-CM | POA: Insufficient documentation

## 2017-02-05 DIAGNOSIS — W19XXXA Unspecified fall, initial encounter: Secondary | ICD-10-CM

## 2017-02-05 DIAGNOSIS — S0990XA Unspecified injury of head, initial encounter: Secondary | ICD-10-CM | POA: Diagnosis not present

## 2017-02-05 DIAGNOSIS — G4489 Other headache syndrome: Secondary | ICD-10-CM | POA: Diagnosis not present

## 2017-02-05 DIAGNOSIS — R220 Localized swelling, mass and lump, head: Secondary | ICD-10-CM | POA: Diagnosis not present

## 2017-02-05 MED ORDER — LIDOCAINE-EPINEPHRINE-TETRACAINE (LET) SOLUTION
3.0000 mL | Freq: Once | NASAL | Status: AC
  Administered 2017-02-05: 3 mL via TOPICAL
  Filled 2017-02-05: qty 3

## 2017-02-05 NOTE — ED Notes (Signed)
Patient is resting with call bell in reach  

## 2017-02-05 NOTE — ED Provider Notes (Signed)
[facility] EMERGENCY DEPARTMENT Provider Note   CSN: [id number] Arrival date & time: 02/05/17  1159     History   Chief Complaint Chief Complaint  Patient presents with  . Fall    HPI Vanessa [name] is a 84 y.o. female.  HPI 84 year old Caucasian female past medical history significant for A. fib, hypothyroidism that presents to the emergency department today with hematoma after mechanical fall to her head.  Patient states that she was walking when she got tripped up and fell backwards hitting the back of her head on the ground.  Patient is coming from assisted living by EMS.  Patient denies some pain to the back of her head where she has a hematoma but denies any other pain at this time specifically patient denies any neck pain, back pain, hip pain.  Patient denies LOC.  Patient has ambulated since the event without any difficulties.  Denies any associated lightheadedness, dizziness, vision changes.  Patient is not on blood thinners.  Patient ambulatory since the event.  Denies any associated abdominal pain, chest pain. States her tdap is utd. Past Medical History:  Diagnosis Date  . Abdominal bloating   . Atrial fibrillation ([place])   . Bruises easily   . Carotid artery occlusion    LEFT  . Dizziness   . DOE (dyspnea on exertion) 04/02/2014  . Fall at home Sept 2013, Dec. 2013  Jun 08, 2012  . GERD (gastroesophageal reflux disease) 02/11/2015  . Headache(784.0)   . Hoarseness   . Hypercholesterolemia   . Hyperglycemia 04/02/2014   Glucose 178 mg percent on 01/22/2014 04/05/14 Hgb A1c 6.6 Diet controlled.    . Hypothyroidism 04/15/2015  . Neuropathy    PERIPHERAL  . Pruritus   . Scoliosis   . Varicose veins     Patient Active Problem List   Diagnosis Date Noted  . Moderate protein-calorie malnutrition ([place]) 12/27/2016  . Closed fracture of nasal bones   . Severe protein-calorie malnutrition ([place])   . Atrial fibrillation with RVR ([place]) 12/02/2016  .  Esophageal stenosis   . History of rib fracture 08/04/2016  . Age-related osteoporosis without current pathological fracture 08/04/2016  . Paresthesia 05/12/2016  . Unsteady gait 04/19/2016  . Urinary frequency 12/09/2015  . OCD (obsessive compulsive disorder) 09/02/2015  . Chest pain 06/12/2015  . Hypothyroidism 04/15/2015  . Suprapubic pain 04/01/2015  . GERD (gastroesophageal reflux disease) 02/11/2015  . Constipation 02/11/2015  . Aspiration into respiratory tract 08/27/2014  . Cricopharyngeal hypertrophy 08/27/2014  . Muscle tension dysphonia 08/27/2014  . 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 Low back pain 08/13/2014  . Carotid stenosis 08/13/2014  . Hoarse 08/13/2014  . Xerostomia 06/18/2014  . Prickling sensation-Left Leg 06/05/2014  . Tingling sensation-Left Leg 06/05/2014  . Swelling of ankle 06/05/2014  . Hyperglycemia 04/02/2014  . Loss of weight 04/02/2014  . DOE (dyspnea on exertion) 04/02/2014  . Idiopathic scoliosis 04/02/2014  . Hearing loss 04/02/2014  . Dysphagia 04/02/2014  . Seborrheic keratoses, inflamed 04/02/2014  . Palliative care encounter 09/12/2013  . Weakness generalized 09/12/2013  . Anxiety state 09/12/2013  . GI bleed 09/10/2013  . Other and unspecified ovarian cyst 09/10/2013  . Fall 09/03/2013  . PVD (peripheral vascular disease) ([place]) 02/21/2013  . Occlusion of left internal carotid artery 08/16/2012  . Varicose veins 12/22/2011  . Malaise and fatigue 10/06/2011  . Raynaud phenomenon 06/26/2010  . Osteoarthritis 06/26/2010  . Atrial fibrillation ([place])   . Neuropathy   .  Hyperlipidemia   . Headache   . Pruritus     Past Surgical History:  Procedure Laterality Date  . ABDOMINAL HYSTERECTOMY  1954  . CHOLECYSTECTOMY  1997  . ESOPHAGOGASTRODUODENOSCOPY (EGD) WITH PROPOFOL N/A 11/25/2016   Procedure: ESOPHAGOGASTRODUODENOSCOPY (EGD) WITH PROPOFOL;  Surgeon: [name], MD;  Location: [facility] END[facility]COPY;  Service: Endoscopy;   Laterality: N/A;  . [place]   correct scoliosis    OB History    No data available       Home Medications    Prior to Admission medications   Medication Sig Start Date End Date Taking? Authorizing Provider  acetaminophen (TYLENOL) 500 MG tablet Take 500 mg every 6 (six) hours as needed by mouth (for pain/back pain.).     [provider]  antiseptic oral rinse (BIOTENE) LIQD 15 mLs by Mouth Rinse route as needed for dry mouth.    [provider]  aspirin EC 81 MG tablet Take 81 mg daily by mouth.    [provider]  atenolol (TENORMIN) 25 MG tablet Take 0.5 tablets (12.5 mg total) by mouth 2 (two) times daily. 12/07/16   Allie Bossier, MD  Calcium-Vitamin D-Vitamin K (VIACTIV) [phone] MG-UNT-MCG CHEW Chew 1 tablet daily by mouth.    [provider]  cephALEXin (KEFLEX) 500 MG capsule Take 1 capsule (500 mg total) by mouth 3 (three) times daily for 7 days. 02/03/17 02/10/17  [name], Dinah C, NP  DULoxetine (CYMBALTA) 20 MG capsule TAKE 1 CAPSULE AT BEDTIME. 10/04/16   Ward Givens, NP  fluticasone (FLONASE) 50 MCG/ACT nasal spray Place 2 sprays into both nostrils daily.    [provider]  hydrogen peroxide 1.5 % SOLN 15 mLs as needed (swish and spit).    [provider]  lactose free nutrition (BOOST) LIQD Take 237 mLs by mouth 3 (three) times daily between meals.    [provider]  multivitamin-iron-minerals-folic acid (CENTRUM) chewable tablet Chew 1 tablet daily by mouth.    [provider]  ondansetron (ZOFRAN) 4 MG tablet Take 1 tablet (4 mg total) by mouth every 6 (six) hours as needed for nausea. 12/07/16   Allie Bossier, MD  oxyCODONE (OXY IR/ROXICODONE) 5 MG immediate release tablet Take 1 tablet (5 mg total) by mouth every 6 (six) hours as needed for severe pain. 12/07/16   Allie Bossier, MD  polyethylene glycol [facility] / Floria Raveling) packet Take 17 g daily as needed by mouth (for  constipation).     [provider]  simvastatin (ZOCOR) 40 MG tablet Take 20 mg at bedtime by mouth.     [provider]  traMADol (ULTRAM) 50 MG tablet Take 1 tablet (50 mg total) by mouth every 6 (six) hours as needed for moderate pain. 12/07/16   Allie Bossier, MD  UNABLE TO FIND Med Name: Biotin 5 mg tablet by mouth daily    [provider]    Family History Family History  Adopted: Yes  Problem Relation Age of Onset  . Diabetes Mother   . Heart attack Mother   . Heart disease Mother        After age 37  . Heart attack Father   . Stroke Father   . Breast cancer Sister   . Heart disease Maternal Grandmother   . Cancer Son        adopted son. esophagus, stomach, liver  . Stomach cancer Neg Hx   . Colon cancer Neg Hx  Social History Social History   Tobacco Use  . Smoking status: Never Smoker  . Smokeless tobacco: Never Used  Substance Use Topics  . Alcohol use: No    Alcohol/week: 0.0 oz  . Drug use: No     Allergies   Patient has no known allergies.   Review of Systems Review of Systems  Constitutional: Negative for chills and fever.  HENT: Negative for congestion.   Eyes: Negative for visual disturbance.  Respiratory: Negative for shortness of breath.   Cardiovascular: Negative for chest pain.  Gastrointestinal: Negative for abdominal pain and vomiting.  Musculoskeletal: Negative for back pain, myalgias, neck pain and neck stiffness.  Skin: Positive for wound.  Neurological: Positive for headaches. Negative for syncope and weakness.     Physical Exam Updated Vital Signs BP (!) 146/84   Pulse 61   Temp 98.3 F (36.8 C) (Oral)   Resp 19   Ht 5\' 2"  (1.575 m)   Wt 42.6 kg (94 lb)   SpO2 98%   BMI 17.19 kg/m   Physical Exam  Constitutional: She is oriented to person, place, and time. She appears well-developed and well-nourished. No distress.  HENT:  Head: Normocephalic and atraumatic.  No bilateral hemotympanum.   No septal hematoma.  No battle signs or raccoon eyes  Eyes: Conjunctivae are normal. Right eye exhibits no discharge. Left eye exhibits no discharge. No scleral icterus.  Neck: Normal range of motion. Neck supple.  No c spine midline tenderness. No paraspinal tenderness. No deformities or step offs noted. Full ROM. Supple. No nuchal rigidity.    Cardiovascular: Normal rate, regular rhythm, normal heart sounds and intact distal pulses. Exam reveals no gallop and no friction rub.  No murmur heard. Pulmonary/Chest: Effort normal and breath sounds normal. No stridor. No respiratory distress. She has no wheezes. She has no rales. She exhibits no tenderness.  No chest wall tenderness.  No crepitus or step-off noted.  No ecchymosis.  Abdominal: Soft. Bowel sounds are normal. She exhibits no distension. There is no tenderness. There is no rebound and no guarding.  No ecchymosis.  Musculoskeletal: Normal range of motion.  No midline T spine or L spine tenderness. No deformities or step offs noted. Full ROM. Pelvis is stable.  She moving all extremities any difficulties.  Neurovascularly intact in all extremities.  No bruising noted.  Range of motion of all joints of the right hand.  No obvious deformity.  Patient denies pain.  Brisk cap refill radial pulses 2+ bilaterally.  Neurological: She is alert and oriented to person, place, and time.  Follows commands appropriately.  The patient is alert, attentive, and oriented x 3. Speech is clear. Cranial nerve II-VII grossly intact. Negative pronator drift. Sensation intact. Strength 5/5 in all extremities. Reflexes 2+ and symmetric at biceps, triceps, knees, and ankles. Rapid alternating movement and fine finger movements intact   Skin: Skin is warm and dry. Capillary refill takes less than 2 seconds. No pallor.  Patient has small skin tear to the right lateral hand over the fifth medical carpal.  This was repaired by EMS with Steri-Strips and dressing.    Psychiatric: Her behavior is normal. Judgment and thought content normal.  Nursing note and vitals reviewed.    ED Treatments / Results  Labs (all labs ordered are listed, but only abnormal results are displayed) Labs Reviewed - No data to display  EKG  EKG Interpretation None       Radiology Ct Cervical Spine Wo Contrast  Result Date: 02/05/2017 CLINICAL DATA:  Fall with posterior scalp laceration. EXAM: CT HEAD WITHOUT CONTRAST CT CERVICAL SPINE WITHOUT CONTRAST TECHNIQUE: Multidetector CT imaging of the head and cervical spine was performed following the standard protocol without intravenous contrast. Multiplanar CT image reconstructions of the cervical spine were also generated. COMPARISON:  12/01/2016 FINDINGS: CT HEAD FINDINGS Brain: There is no evidence for acute hemorrhage, hydrocephalus, mass lesion, or abnormal extra-axial fluid collection. No definite CT evidence for acute infarction. Diffuse loss of parenchymal volume is consistent with atrophy. Diffuse loss of parenchymal volume is consistent with atrophy. patchy low attenuation in the deep hemispheric and periventricular white matter is nonspecific, but likely reflects chronic microvascular ischemic demyelination. Vascular: Atherosclerotic calcification of the carotid siphons noted at the skull base. No dense MCA sign. Skull: No evidence for fracture. No worrisome lytic or sclerotic lesion. Sinuses/Orbits: The visualized paranasal sinuses and mastoid air cells are clear. Visualized portions of the globes and intraorbital fat are unremarkable. Other: Focal scalp swelling noted left posterior parietal region. CT CERVICAL SPINE FINDINGS Alignment: Straightening of normal cervical lordosis. Skull base and vertebrae: No acute fracture. No primary bone lesion or focal pathologic process. Soft tissues and spinal canal: No prevertebral fluid or swelling. No visible canal hematoma. Disc levels: Trace anterolisthesis of C3 on 4 is stable  with bilateral facet fusion associated. Trace degenerative anterolisthesis of C4 on 5 is stable. Fusion across the C5-6 interspace is unchanged and near complete loss of disc height is seen at C6-7. Stable degenerative anterolisthesis of C7 on T1. Upper chest: Posterior biapical pleural-parenchymal scarring is similar to prior. Other: None. IMPRESSION: 1. No acute intracranial abnormality. Atrophy with chronic small vessel white matter ischemic disease. 2. Left parietal scalp swelling. 3. No evidence for an acute cervical spine fracture. 4. Stable advanced multilevel degenerative changes with stable straightening of normal cervical lordosis. Electronically Signed   By: Misty Stanley M.D.   On: 02/05/2017 13:58    Procedures .Marland KitchenLaceration Repair Date/Time: 02/05/2017 3:56 PM Performed by: Doristine Devoid, PA-C Authorized by: Doristine Devoid, PA-C   Consent:    Consent obtained:  Verbal   Consent given by:  Patient   Risks discussed:  Infection, need for additional repair, nerve damage, poor wound healing, poor cosmetic result, pain, retained foreign body, tendon damage and vascular damage   Alternatives discussed:  No treatment Anesthesia (see MAR for exact dosages):    Anesthesia method:  Topical application   Topical anesthetic:  LET Laceration details:    Location:  Scalp   Scalp location:  L parietal   Length (cm):  0.1 Repair type:    Repair type:  Simple Pre-procedure details:    Preparation:  Patient was prepped and draped in usual sterile fashion and imaging obtained to evaluate for foreign bodies Exploration:    Contaminated: no   Treatment:    Area cleansed with:  Hibiclens and saline   Amount of cleaning:  Standard   Irrigation method:  Pressure wash   Visualized foreign bodies/material removed: no   Skin repair:    Repair method:  Staples   Number of staples:  2 Approximation:    Approximation:  Loose   Vermilion border: poorly aligned   Post-procedure details:      Dressing: surgicel and pressure dressing.   Patient tolerance of procedure:  Tolerated well, no immediate complications   (including critical care time)  Medications Ordered in ED Medications  lidocaine-EPINEPHrine-tetracaine (LET) solution (not administered)     Initial  Impression / Assessment and Plan / ED Course  I have reviewed the triage vital signs and the nursing notes.  Pertinent labs & imaging results that were available during my care of the patient were reviewed by me and considered in my medical decision making (see chart for details).     Patient with mechanical fall with hematoma to the left posterior parietal region.  Patient reports her tetanus shot is up-to-date.  Denies any loss of consciousness or any other pain at this time.  Her vital signs are reassuring.  No signs of intrathoracic, intra-abdominal trauma.  She does have a hematoma to the left parietal region with a small amount of bleeding but no open wound that require repair.  Patient is not hypotensive or tachycardic in the ED.  Exam is reassuring for any focal neuro deficit.  She is neurovascularly intact in all extremities.  CT imaging of her head and neck did not show any acute abnormalities.  Bleeding was controlled with let and pressure dressing.  Patient able to ambulate doubt any hip fracture.  Patient will be discharged back to assisted living with outpatient follow-up.  Pt is hemodynamically stable, in NAD, & able to ambulate in the ED. Evaluation does not show pathology that would require ongoing emergent intervention or inpatient treatment. I explained the diagnosis to the patient. Pain has been managed & has no complaints prior to dc. Pt is comfortable with above plan and is stable for discharge at this time. All questions were answered prior to disposition. Strict return precautions for f/u to the ED were discussed. Encouraged follow up with PCP.  Pt was dicussed and seen by my attending who is  agreeable with the above plan.   She was being discharged was noted that her wound was still bleeding.  On exam there is still no open wound however it is oozing from the hematoma.  2 staples were placed to try to close this area that caused some tear through the hematoma itself.  Surgicel was applied with pressure dressing and antibiotic ointment.  Patient encouraged to keep pressure dressing on for 24 hours and reassess if is still bleeding may need follow-up primary care.  Final Clinical Impressions(s) / ED Diagnoses   Final diagnoses:  Fall, initial encounter  Hematoma of scalp, initial encounter    ED Discharge Orders    None       Aaron Edelman 02/05/17 1448    Doristine Devoid, PA-C 02/05/17 1558    Doristine Devoid, PA-C 02/05/17 1628    Carmin Muskrat, MD 02/06/17 [phone]

## 2017-02-05 NOTE — Discharge Instructions (Addendum)
Your ct scan was normal. You have a small bruise to the back of your head with some small bleeding. There is no need for any sutures. Please keep a pressure dressing on the wound to help stop the bleeding. Keep the area clean and dry. Make sure you follow up with your primary care doctor. You can take tylenol for pain. .  WOUND CARE Please have your stitches/staples removed in 5 or sooner if you have concerns. You may do this at any available urgent care or at your primary care doctor's office.  Keep area clean and dry for 24 hours. Do not remove bandage, if applied.  After 24 hours, remove bandage and wash wound gently with mild soap and warm water. Reapply a new bandage after cleaning wound, if directed.  Continue daily cleansing with soap and water until stitches/staples are removed.  Do not apply any ointments or creams to the wound while stitches/staples are in place, as this may cause delayed healing.  Seek medical careif you experience any of the following signs of infection: Swelling, redness, pus drainage, streaking, fever >101.0 F  Seek care if you experience excessive bleeding that does not stop after 15-20 minutes of constant, firm pressure.

## 2017-02-05 NOTE — ED Triage Notes (Signed)
Pt arrives via [facility] from [facility] after a unwitnessed mechanical fall.  Pt denies any neck or back pain, denies LOC, ambulated from EMS stretcher in hallway to stretcher in room.  Denies and dizziness.  Not on blood thinners.

## 2017-02-05 NOTE — ED Triage Notes (Signed)
Pt has small hematoma to posterior head, small laceration.  EMS reports [facility] RN applied Vaseline to laceration.

## 2017-02-05 NOTE — ED Notes (Signed)
Per EDP, rewrap dressing with let. Dressing reapplied by ED tech, this RN applied the let.

## 2017-02-05 NOTE — ED Notes (Signed)
Pt rolled out into hallway near nurses station waiting on PTAR. Ambulated to restroom with no difficulty.

## 2017-02-05 NOTE — ED Notes (Signed)
Ordered ensure for pt per request.

## 2017-02-05 NOTE — ED Notes (Signed)
Got patient on the monitor patient is resting with call bell in reach  ?

## 2017-02-05 NOTE — ED Notes (Signed)
Sent A-11 to materials for surgi-seal.

## 2017-02-05 NOTE — ED Notes (Addendum)
Pt discharged back to [facility] with PTAR.

## 2017-02-07 ENCOUNTER — Encounter: Payer: Self-pay | Admitting: Internal Medicine

## 2017-02-07 ENCOUNTER — Non-Acute Institutional Stay: Payer: Medicare Other | Admitting: Internal Medicine

## 2017-02-07 DIAGNOSIS — G8929 Other chronic pain: Secondary | ICD-10-CM

## 2017-02-07 DIAGNOSIS — W19XXXD Unspecified fall, subsequent encounter: Secondary | ICD-10-CM

## 2017-02-07 DIAGNOSIS — L03116 Cellulitis of left lower limb: Secondary | ICD-10-CM | POA: Diagnosis not present

## 2017-02-07 DIAGNOSIS — S0101XD Laceration without foreign body of scalp, subsequent encounter: Secondary | ICD-10-CM | POA: Diagnosis not present

## 2017-02-07 DIAGNOSIS — R2681 Unsteadiness on feet: Secondary | ICD-10-CM | POA: Diagnosis not present

## 2017-02-07 DIAGNOSIS — E43 Unspecified severe protein-calorie malnutrition: Secondary | ICD-10-CM

## 2017-02-07 DIAGNOSIS — E785 Hyperlipidemia, unspecified: Secondary | ICD-10-CM

## 2017-02-07 NOTE — Progress Notes (Signed)
Location:  [place] Room Number: 1 Place of Service:  [phone]) Provider:  Blanchie Serve MD  Blanchie Serve, MD  Patient Care Team: Blanchie Serve, MD as PCP - General (Internal Medicine) Angelia Mould, MD as Attending Physician (Vascular Surgery) [name], MD as Attending Physician (Obstetrics and Gynecology) [name], MD (Orthopedic Surgery) [name], MD (Ophthalmology) [name], MD as Consulting Physician (Urology) [name], MD as Consulting Physician (Cardiology) [name], PA-C as Physician Assistant (Otolaryngology) [name], MD as Consulting Physician (Otolaryngology) [name], MD as Consulting Physician (Dermatology) [name], MD as Consulting Physician (Physical Medicine and Rehabilitation) [name], MD as Attending Physician (Gastroenterology) Mast, Man X, NP as Nurse Practitioner (Internal Medicine) Kathrynn Ducking, MD as Consulting Physician (Neurology) Debara Pickett Nadean Corwin, MD as Consulting Physician (Cardiology) Druscilla Brownie, MD as Consulting Physician (Dermatology)  Extended Emergency Contact Information Primary Emergency Contact: Vanessa [name] of [place] Phone: [phone] Mobile Phone: [phone] Relation: Son   Goals of care: Advanced Directive information Advanced Directives 02/07/2017  Does Patient Have a Medical Advance Directive? Yes  Type of Paramedic of [place];Living will  Does patient want to make changes to medical advance directive? No - Patient declined  Copy of [place] in Chart? Yes  Would patient like information on creating a medical advance directive? -     Chief Complaint  Patient presents with  . Medical Management of Chronic Issues    Routine Visit     HPI:  Pt is a 84 y.o. female seen today for medical management of chronic diseases.  She was in the ED on 02/05/17 post fall  with laceration to posterior scalp. She required staples for this. She also sustained few skin tears to her RUE. She had CT brain that was negative for acute bleed and fracture. She was sent back to the facility. She is seen in her room today for routine visit. She denies any complaint. She is unsure how she fell. She has unsteady gait and uses a walker to ambulate.    Past Medical History:  Diagnosis Date  . Abdominal bloating   . Atrial fibrillation ([place])   . Bruises easily   . Carotid artery occlusion    LEFT  . Dizziness   . DOE (dyspnea on exertion) 04/02/2014  . Fall at home Sept 2013, Dec. 2013  Jun 08, 2012  . GERD (gastroesophageal reflux disease) 02/11/2015  . Headache(784.0)   . Hoarseness   . Hypercholesterolemia   . Hyperglycemia 04/02/2014   Glucose 178 mg percent on 01/22/2014 04/05/14 Hgb A1c 6.6 Diet controlled.    . Hypothyroidism 04/15/2015  . Neuropathy    PERIPHERAL  . Pruritus   . Scoliosis   . Varicose veins    Past Surgical History:  Procedure Laterality Date  . ABDOMINAL HYSTERECTOMY  1954  . CHOLECYSTECTOMY  1997  . ESOPHAGOGASTRODUODENOSCOPY (EGD) WITH PROPOFOL N/A 11/25/2016   Procedure: ESOPHAGOGASTRODUODENOSCOPY (EGD) WITH PROPOFOL;  Surgeon: [name], MD;  Location: [facility];  Service: Endoscopy;  Laterality: N/A;  . [place]   correct scoliosis    No Known Allergies  Outpatient Encounter Medications as of 02/07/2017  Medication Sig  . acetaminophen (TYLENOL) 500 MG tablet Take 500 mg by mouth 2 (two) times daily.   Marland Kitchen antiseptic oral rinse (BIOTENE) LIQD 15 mLs by Mouth Rinse route as needed for dry mouth.  Marland Kitchen aspirin EC 81 MG tablet  Take 81 mg daily by mouth.  Marland Kitchen atenolol (TENORMIN) 25 MG tablet Take 0.5 tablets (12.5 mg total) by mouth 2 (two) times daily.  . cephALEXin (KEFLEX) 500 MG capsule Take 1 capsule (500 mg total) by mouth 3 (three) times daily for 7 days.  . DULoxetine (CYMBALTA) 20 MG capsule TAKE 1 CAPSULE AT  BEDTIME.  . fluticasone (FLONASE) 50 MCG/ACT nasal spray Place 2 sprays into both nostrils daily.  Marland Kitchen lactose free nutrition (BOOST) LIQD Take 237 mLs by mouth 3 (three) times daily between meals.  . multivitamin-iron-minerals-folic acid (CENTRUM) chewable tablet Chew 1 tablet daily by mouth.  . ondansetron (ZOFRAN) 4 MG tablet Take 1 tablet (4 mg total) by mouth every 6 (six) hours as needed for nausea.  Marland Kitchen oxyCODONE (OXY IR/ROXICODONE) 5 MG immediate release tablet Take 1 tablet (5 mg total) by mouth every 6 (six) hours as needed for severe pain.  . polyethylene glycol (MIRALAX / GLYCOLAX) packet Take 17 g daily as needed by mouth (for constipation).   . saccharomyces boulardii (FLORASTOR) 250 MG capsule Take 250 mg by mouth 2 (two) times daily. Stop date 02/13/17  . simvastatin (ZOCOR) 40 MG tablet Take 20 mg at bedtime by mouth.   . traMADol (ULTRAM) 50 MG tablet Take 1 tablet (50 mg total) by mouth every 6 (six) hours as needed for moderate pain.  Marland Kitchen UNABLE TO FIND Med Name: Biotin 5 mg tablet by mouth daily  . [DISCONTINUED] Calcium-Vitamin D-Vitamin K (VIACTIV) [id number] MG-UNT-MCG CHEW Chew 1 tablet daily by mouth.  . [DISCONTINUED] hydrogen peroxide 1.5 % SOLN 15 mLs as needed (swish and spit).   No facility-administered encounter medications on file as of 02/07/2017.     Review of Systems  Constitutional: Negative for appetite change, chills, fatigue and fever.  HENT: Positive for hearing loss. Negative for congestion, rhinorrhea and trouble swallowing.   Respiratory: Negative for cough and shortness of breath.   Cardiovascular: Positive for leg swelling. Negative for chest pain and palpitations.  Gastrointestinal: Negative for abdominal pain, constipation, diarrhea, nausea and vomiting.  Genitourinary: Negative for dysuria and flank pain.  Musculoskeletal: Positive for gait problem. Negative for back pain and neck pain.  Skin: Positive for wound. Negative for rash.  Neurological:  Negative for dizziness, light-headedness and headaches.  Hematological: Bruises/bleeds easily.  Psychiatric/Behavioral: Negative for behavioral problems and confusion.    Immunization History  Administered Date(s) Administered  . Influenza Split 10/20/2011, 11/03/2012  . Influenza,inj,Quad PF,6+ Mos 09/29/2012, 11/09/2013  . Influenza-Unspecified 10/17/2014, 10/23/2015, 10/20/2016  . PPD Test 08/21/2013, 03/04/2014  . Pneumococcal Conjugate-13 08/21/2013  . Tdap 05/20/2008  . Zoster 11/01/2012   Pertinent  Health Maintenance Due  Topic Date Due  . DEXA SCAN  01/31/1987  . PNA vac Low Risk Adult (2 of 2 - PPSV23) 08/22/2014  . INFLUENZA VACCINE  Completed   Fall Risk  09/22/2016 08/25/2016 05/31/2016 09/02/2015 04/29/2015  Falls in the past year? Yes Yes No No No  Number falls in past yr: 1 2 or more - - -  Comment - - - - -  Injury with Fall? Yes Yes - - -  Comment fx 3 ribs - - - -  Risk Factor Category  - - - - -  Risk for fall due to : Impaired mobility;Impaired balance/gait - - - -  Risk for fall due to: Comment - - - - -   Functional Status Survey:    Vitals:   02/07/17 1227  BP: (!) 110/54  Pulse: 85  Resp: 18  Temp: (!) 95.9 F (35.5 C)  TempSrc: Oral  SpO2: 93%  Weight: 94 lb 9.6 oz (42.9 kg)  Height: 5\' 2"  (1.575 m)   Body mass index is 17.3 kg/m.   Wt Readings from Last 3 Encounters:  02/07/17 94 lb 9.6 oz (42.9 kg)  02/05/17 94 lb (42.6 kg)  02/03/17 94 lb 9.6 oz (42.9 kg)   Physical Exam  Constitutional: She is oriented to person, place, and time. No distress.  Thin built, frail, elderly female  HENT:  Head: Normocephalic and atraumatic.  Mouth/Throat: Oropharynx is clear and moist. No oropharyngeal exudate.  Eyes: Conjunctivae are normal. Pupils are equal, round, and reactive to light. Right eye exhibits no discharge. Left eye exhibits no discharge.  Neck: Neck supple.  Cardiovascular: Normal rate and regular rhythm.  Murmur  heard. Pulmonary/Chest: Effort normal and breath sounds normal. She has no wheezes. She has no rales.  Abdominal: Soft. Bowel sounds are normal. There is no tenderness.  Musculoskeletal: She exhibits edema, tenderness and deformity.  Lymphadenopathy:    She has no cervical adenopathy.  Neurological: She is alert and oriented to person, place, and time.  Skin: Skin is warm and dry. No rash noted. She is not diaphoretic.  Skin tear to right cubital fossa and right hand with steri strips, bruising to both arms, laceration to posterior scal with 2 staples, scalp hematoma +, erythema to LLE above medial malleolus with induration, non tender, normal temperature, distal pulses are palpable. Varicose veins present  Psychiatric: She has a normal mood and affect. Her behavior is normal.    Labs reviewed: Recent Labs    12/02/16 0511 12/04/16 0220 12/07/16 0750 12/16/16  NA 138 135 135 137  K 4.2 4.2 3.9 4.8  CL 103 104 102  --   CO2 28 25 26   --   GLUCOSE 110* 89 100*  --   BUN 15 10 13 17   CREATININE 0.72 0.65 0.72 0.8  CALCIUM 8.7* 8.5* 8.9  --   MG 1.9  --  2.1  --    Recent Labs    05/12/16 1304 07/01/16 07/29/16 0740 12/04/16 0220  AST  --  22 19 24   ALT  --  11 9 13*  ALKPHOS  --  101 80 75  BILITOT  --   --  0.6 0.6  PROT 6.5  --  5.7* 5.5*  ALBUMIN  --   --  3.2* 2.7*   Recent Labs    12/01/16 1904 12/02/16 0511 12/04/16 0220 12/16/16  WBC 8.4 11.1* 9.9 6.9  NEUTROABS 6.3 8.0*  --   --   HGB 13.8 11.5* 11.6* 11.8*  HCT 42.0 35.1* 34.6* 34*  MCV 98.4 98.3 98.0  --   PLT 221 199 213 276   Lab Results  Component Value Date   TSH 1.272 12/02/2016   Lab Results  Component Value Date   HGBA1C 6.4 07/07/2015   Lab Results  Component Value Date   CHOL 152 07/29/2016   HDL 74 07/29/2016   LDLCALC 63 07/29/2016   TRIG 77 07/29/2016   CHOLHDL 2.1 07/29/2016    Significant Diagnostic Results in last 30 days:  Ct Head Wo Contrast  Result Date:  02/05/2017 CLINICAL DATA:  Patient with fall. No reported loss of consciousness. Posterior scalp hematoma. EXAM: CT HEAD WITHOUT CONTRAST TECHNIQUE: Contiguous axial images were obtained from the base of the skull through the vertex without intravenous contrast. COMPARISON:  Brain  CT 02/05/2017. FINDINGS: Brain: Ventricles and sulci are prominent compatible with atrophy. Periventricular and subcortical white matter hypodensity compatible with chronic microvascular ischemic changes. No evidence for acute cortically based infarct, intracranial hemorrhage, mass lesion or mass-effect. Vascular: Internal carotid arterial vascular calcifications. Skull: Intact. Sinuses/Orbits: Paranasal sinuses are unremarkable. Mastoid air cells are unremarkable. Orbits are unremarkable. Other: Soft tissue swelling overlying the posterior calvarium. IMPRESSION: Soft tissue swelling overlying the posterior calvarium. No acute intracranial process. Atrophy and chronic microvascular ischemic changes. Electronically Signed   By: Lovey Newcomer M.D.   On: 02/05/2017 16:06   Ct Cervical Spine Wo Contrast  Result Date: 02/05/2017 CLINICAL DATA:  Fall with posterior scalp laceration. EXAM: CT HEAD WITHOUT CONTRAST CT CERVICAL SPINE WITHOUT CONTRAST TECHNIQUE: Multidetector CT imaging of the head and cervical spine was performed following the standard protocol without intravenous contrast. Multiplanar CT image reconstructions of the cervical spine were also generated. COMPARISON:  12/01/2016 FINDINGS: CT HEAD FINDINGS Brain: There is no evidence for acute hemorrhage, hydrocephalus, mass lesion, or abnormal extra-axial fluid collection. No definite CT evidence for acute infarction. Diffuse loss of parenchymal volume is consistent with atrophy. Diffuse loss of parenchymal volume is consistent with atrophy. patchy low attenuation in the deep hemispheric and periventricular white matter is nonspecific, but likely reflects chronic microvascular  ischemic demyelination. Vascular: Atherosclerotic calcification of the carotid siphons noted at the skull base. No dense MCA sign. Skull: No evidence for fracture. No worrisome lytic or sclerotic lesion. Sinuses/Orbits: The visualized paranasal sinuses and mastoid air cells are clear. Visualized portions of the globes and intraorbital fat are unremarkable. Other: Focal scalp swelling noted left posterior parietal region. CT CERVICAL SPINE FINDINGS Alignment: Straightening of normal cervical lordosis. Skull base and vertebrae: No acute fracture. No primary bone lesion or focal pathologic process. Soft tissues and spinal canal: No prevertebral fluid or swelling. No visible canal hematoma. Disc levels: Trace anterolisthesis of C3 on 4 is stable with bilateral facet fusion associated. Trace degenerative anterolisthesis of C4 on 5 is stable. Fusion across the C5-6 interspace is unchanged and near complete loss of disc height is seen at C6-7. Stable degenerative anterolisthesis of C7 on T1. Upper chest: Posterior biapical pleural-parenchymal scarring is similar to prior. Other: None. IMPRESSION: 1. No acute intracranial abnormality. Atrophy with chronic small vessel white matter ischemic disease. 2. Left parietal scalp swelling. 3. No evidence for an acute cervical spine fracture. 4. Stable advanced multilevel degenerative changes with stable straightening of normal cervical lordosis. Electronically Signed   By: Misty Stanley M.D.   On: 02/05/2017 13:58    Assessment/Plan  Unsteady gait Will have patient work with PT as tolerated to regain strength and restore function.  Fall precautions are in place.  Fall subsequent encounter S/p fall with scalp hematoma and laceration to head. High fall risk. PT consult as above.  Laceration scalp Subsequent encounter. 2 staples in place. Dressing with dried blood removed. Monitor clinically. Remove staples in 10 days.   Hyperlipidemia Lipid Panel     Component Value  Date/Time   CHOL 152 07/29/2016 0740   TRIG 77 07/29/2016 0740   HDL 74 07/29/2016 0740   CHOLHDL 2.1 07/29/2016 0740   VLDL 15 07/29/2016 0740   LDLCALC 63 07/29/2016 0740   Check lipid panel. Continue simvastatin 20 mg daily.  Left leg cellulitis Improving. Continue keflex 500 mg tid until 02/10/17, monitor for fever  Protein calorie malnutrition Low but stable weight. Encourage oral intake as tolerated. Continue nutritional supplement.  Chronic pain  Multiple reasons- history of fracture, has neuropathy and chronic low back pain, has OA. Currently on prn oxycodone and prn tramadol. Has required tramadol at times but has not required oxycodone in last 2 weeks at all. D/c prn oxycodone order.    Family/ staff Communication: reviewed care plan with patient and charge nurse.    Labs/tests ordered:  Lipid panel   Blanchie Serve, MD Internal Medicine [facility] [address] [place], [place] [address] Cell Phone (Monday-Friday 8 am - 5 pm): [phone] On Call: [phone] and follow prompts after 5 pm and on weekends Office Phone: [phone] Office Fax: [phone]

## 2017-02-08 DIAGNOSIS — Z9181 History of falling: Secondary | ICD-10-CM | POA: Diagnosis not present

## 2017-02-08 DIAGNOSIS — R29898 Other symptoms and signs involving the musculoskeletal system: Secondary | ICD-10-CM | POA: Diagnosis not present

## 2017-02-08 DIAGNOSIS — R2681 Unsteadiness on feet: Secondary | ICD-10-CM | POA: Diagnosis not present

## 2017-02-08 DIAGNOSIS — R296 Repeated falls: Secondary | ICD-10-CM | POA: Diagnosis not present

## 2017-02-08 DIAGNOSIS — M6281 Muscle weakness (generalized): Secondary | ICD-10-CM | POA: Diagnosis not present

## 2017-02-08 DIAGNOSIS — R1312 Dysphagia, oropharyngeal phase: Secondary | ICD-10-CM | POA: Diagnosis not present

## 2017-02-09 DIAGNOSIS — R296 Repeated falls: Secondary | ICD-10-CM | POA: Diagnosis not present

## 2017-02-09 DIAGNOSIS — R1312 Dysphagia, oropharyngeal phase: Secondary | ICD-10-CM | POA: Diagnosis not present

## 2017-02-09 DIAGNOSIS — R29898 Other symptoms and signs involving the musculoskeletal system: Secondary | ICD-10-CM | POA: Diagnosis not present

## 2017-02-09 DIAGNOSIS — Z9181 History of falling: Secondary | ICD-10-CM | POA: Diagnosis not present

## 2017-02-09 DIAGNOSIS — M6281 Muscle weakness (generalized): Secondary | ICD-10-CM | POA: Diagnosis not present

## 2017-02-09 DIAGNOSIS — R2681 Unsteadiness on feet: Secondary | ICD-10-CM | POA: Diagnosis not present

## 2017-02-10 DIAGNOSIS — R296 Repeated falls: Secondary | ICD-10-CM | POA: Diagnosis not present

## 2017-02-10 DIAGNOSIS — R2681 Unsteadiness on feet: Secondary | ICD-10-CM | POA: Diagnosis not present

## 2017-02-10 DIAGNOSIS — R29898 Other symptoms and signs involving the musculoskeletal system: Secondary | ICD-10-CM | POA: Diagnosis not present

## 2017-02-10 DIAGNOSIS — E785 Hyperlipidemia, unspecified: Secondary | ICD-10-CM | POA: Diagnosis not present

## 2017-02-10 DIAGNOSIS — Z9181 History of falling: Secondary | ICD-10-CM | POA: Diagnosis not present

## 2017-02-10 DIAGNOSIS — M6281 Muscle weakness (generalized): Secondary | ICD-10-CM | POA: Diagnosis not present

## 2017-02-10 DIAGNOSIS — R1312 Dysphagia, oropharyngeal phase: Secondary | ICD-10-CM | POA: Diagnosis not present

## 2017-02-10 LAB — LIPID PANEL
Cholesterol: 151 (ref 0–200)
HDL: 76 — AB (ref 35–70)
LDL Cholesterol: 54
Triglycerides: 125 (ref 40–160)

## 2017-02-11 DIAGNOSIS — Z9181 History of falling: Secondary | ICD-10-CM | POA: Diagnosis not present

## 2017-02-11 DIAGNOSIS — R2681 Unsteadiness on feet: Secondary | ICD-10-CM | POA: Diagnosis not present

## 2017-02-11 DIAGNOSIS — M6281 Muscle weakness (generalized): Secondary | ICD-10-CM | POA: Diagnosis not present

## 2017-02-16 DIAGNOSIS — M6281 Muscle weakness (generalized): Secondary | ICD-10-CM | POA: Diagnosis not present

## 2017-02-16 DIAGNOSIS — R2681 Unsteadiness on feet: Secondary | ICD-10-CM | POA: Diagnosis not present

## 2017-02-16 DIAGNOSIS — Z9181 History of falling: Secondary | ICD-10-CM | POA: Diagnosis not present

## 2017-02-17 DIAGNOSIS — Z9181 History of falling: Secondary | ICD-10-CM | POA: Diagnosis not present

## 2017-02-17 DIAGNOSIS — R2681 Unsteadiness on feet: Secondary | ICD-10-CM | POA: Diagnosis not present

## 2017-02-17 DIAGNOSIS — M6281 Muscle weakness (generalized): Secondary | ICD-10-CM | POA: Diagnosis not present

## 2017-02-18 ENCOUNTER — Non-Acute Institutional Stay: Payer: Medicare Other | Admitting: Family

## 2017-02-18 DIAGNOSIS — M6281 Muscle weakness (generalized): Secondary | ICD-10-CM | POA: Diagnosis not present

## 2017-02-18 DIAGNOSIS — M25472 Effusion, left ankle: Secondary | ICD-10-CM

## 2017-02-18 DIAGNOSIS — R2681 Unsteadiness on feet: Secondary | ICD-10-CM | POA: Diagnosis not present

## 2017-02-18 DIAGNOSIS — M79662 Pain in left lower leg: Secondary | ICD-10-CM | POA: Diagnosis not present

## 2017-02-18 DIAGNOSIS — Z9181 History of falling: Secondary | ICD-10-CM | POA: Diagnosis not present

## 2017-02-18 DIAGNOSIS — M25572 Pain in left ankle and joints of left foot: Secondary | ICD-10-CM

## 2017-02-18 DIAGNOSIS — I5022 Chronic systolic (congestive) heart failure: Secondary | ICD-10-CM | POA: Diagnosis not present

## 2017-02-18 DIAGNOSIS — E790 Hyperuricemia without signs of inflammatory arthritis and tophaceous disease: Secondary | ICD-10-CM | POA: Diagnosis not present

## 2017-02-18 DIAGNOSIS — M542 Cervicalgia: Secondary | ICD-10-CM | POA: Diagnosis not present

## 2017-02-18 LAB — CBC
HCT: 38.9
HGB: 13.5
WBC: 7.8
platelet count: 256

## 2017-02-18 LAB — CBC AND DIFFERENTIAL
HCT: 39 (ref 36–46)
Hemoglobin: 13.5 (ref 12.0–16.0)
Platelets: 256 (ref 150–399)
WBC: 7.8

## 2017-02-18 LAB — SEDIMENTATION RATE: SED RATE BY MODIFIED WESTERGREN,MANUAL: 31

## 2017-02-18 NOTE — Progress Notes (Signed)
Location:  [facility] Room Number: 1 Place of Service:  ALF [phone]) Provider: Yanitza Shvartsman FNP-C  Blanchie Serve, MD  Patient Care Team: Blanchie Serve, MD as PCP - General (Internal Medicine) Angelia Mould, MD as Attending Physician (Vascular Surgery) [name], MD as Attending Physician (Obstetrics and Gynecology) [name], MD (Orthopedic Surgery) [name], MD (Ophthalmology) [name], MD as Consulting Physician (Urology) [name], MD as Consulting Physician (Cardiology) [name], PA-C as Physician Assistant (Otolaryngology) [name], MD as Consulting Physician (Otolaryngology) [name], MD as Consulting Physician (Dermatology) [name], MD as Consulting Physician (Physical Medicine and Rehabilitation) [name], MD as Attending Physician (Gastroenterology) Mast, Man X, NP as Nurse Practitioner (Internal Medicine) Kathrynn Ducking, MD as Consulting Physician (Neurology) Debara Pickett Nadean Corwin, MD as Consulting Physician (Cardiology) Druscilla Brownie, MD as Consulting Physician (Dermatology)  Extended Emergency Contact Information Primary Emergency Contact: [name] of [place] Phone: [phone] Mobile Phone: [phone] Relation: Son  Code Status:  DNR Goals of care: Advanced Directive information Advanced Directives 02/07/2017  Does Patient Have a Medical Advance Directive? Yes  Type of Paramedic of [place];Living will  Does patient want to make changes to medical advance directive? No - Patient declined  Copy of [place] in Chart? Yes  Would patient like information on creating a medical advance directive? -     Chief Complaint  Patient presents with  . Acute Visit    left ankle swelling,pain    HPI:  Pt is a 84 y.o. female seen today at [facility] for an acute visit for evaluation of left ankle pain and  swelling.she is seen in her room today per facility Nurse request.Nurse reports patient's previous cellulitis site on left leg dark pink in color and has swollen painful ankle.patient states previous redness has not worsen but ankle is painful.she denies any fever,chills or injury to ankle.Of note she is status post fall 02/06/2016  floor in the bathroom with her walker on top of her. Injuries was reported to the back of head and was Sent to ER for evaluation.   Past Medical History:  Diagnosis Date  . Abdominal bloating   . Atrial fibrillation ([place])   . Bruises easily   . Carotid artery occlusion    LEFT  . Dizziness   . DOE (dyspnea on exertion) 04/02/2014  . Fall at home Sept 2013, Dec. 2013  Jun 08, 2012  . GERD (gastroesophageal reflux disease) 02/11/2015  . Headache(784.0)   . Hoarseness   . Hypercholesterolemia   . Hyperglycemia 04/02/2014   Glucose 178 mg percent on 01/22/2014 04/05/14 Hgb A1c 6.6 Diet controlled.    . Hypothyroidism 04/15/2015  . Neuropathy    PERIPHERAL  . Pruritus   . Scoliosis   . Varicose veins    Past Surgical History:  Procedure Laterality Date  . ABDOMINAL HYSTERECTOMY  1954  . CHOLECYSTECTOMY  1997  . ESOPHAGOGASTRODUODENOSCOPY (EGD) WITH PROPOFOL N/A 11/25/2016   Procedure: ESOPHAGOGASTRODUODENOSCOPY (EGD) WITH PROPOFOL;  Surgeon: [name], MD;  Location: [facility];  Service: Endoscopy;  Laterality: N/A;  . [place]   correct scoliosis    No Known Allergies  Outpatient Encounter Medications as of 02/18/2017  Medication Sig  . acetaminophen (TYLENOL) 500 MG tablet Take 500 mg by mouth 2 (two) times daily.   Marland Kitchen antiseptic oral rinse (BIOTENE) LIQD 15 mLs by Mouth Rinse route as needed for dry  mouth.  . aspirin EC 81 MG tablet Take 81 mg daily by mouth.  Marland Kitchen atenolol (TENORMIN) 25 MG tablet Take 0.5 tablets (12.5 mg total) by mouth 2 (two) times daily.  . DULoxetine (CYMBALTA) 20 MG capsule TAKE 1 CAPSULE AT BEDTIME.  .  fluticasone (FLONASE) 50 MCG/ACT nasal spray Place 2 sprays into both nostrils daily.  Marland Kitchen lactose free nutrition (BOOST) LIQD Take 237 mLs by mouth 3 (three) times daily between meals.  . multivitamin-iron-minerals-folic acid (CENTRUM) chewable tablet Chew 1 tablet daily by mouth.  . ondansetron (ZOFRAN) 4 MG tablet Take 1 tablet (4 mg total) by mouth every 6 (six) hours as needed for nausea.  . polyethylene glycol (MIRALAX / GLYCOLAX) packet Take 17 g daily as needed by mouth (for constipation).   . simvastatin (ZOCOR) 40 MG tablet Take 20 mg at bedtime by mouth.   . traMADol (ULTRAM) 50 MG tablet Take 1 tablet (50 mg total) by mouth every 6 (six) hours as needed for moderate pain.  Marland Kitchen UNABLE TO FIND Med Name: Biotin 5 mg tablet by mouth daily  . [DISCONTINUED] oxyCODONE (OXY IR/ROXICODONE) 5 MG immediate release tablet Take 1 tablet (5 mg total) by mouth every 6 (six) hours as needed for severe pain. (Patient not taking: Reported on 02/18/2017)  . [DISCONTINUED] saccharomyces boulardii (FLORASTOR) 250 MG capsule Take 250 mg by mouth 2 (two) times daily. Stop date 02/13/17   No facility-administered encounter medications on file as of 02/18/2017.     Review of Systems  Constitutional: Negative for appetite change, chills, fatigue and fever.  Respiratory: Negative for cough, chest tightness, shortness of breath and wheezing.   Cardiovascular: Positive for leg swelling. Negative for chest pain and palpitations.  Gastrointestinal: Negative for abdominal distention, abdominal pain, constipation, diarrhea, nausea and vomiting.  Musculoskeletal: Positive for arthralgias and gait problem.       Left ankle pain   Skin: Negative for pallor and rash.       Left leg dark red color to lower left leg   Neurological: Negative for dizziness, syncope, light-headedness, numbness and headaches.  Psychiatric/Behavioral: Negative for agitation, confusion and sleep disturbance. The patient is not nervous/anxious.      Immunization History  Administered Date(s) Administered  . Influenza Split 10/20/2011, 11/03/2012  . Influenza,inj,Quad PF,6+ Mos 09/29/2012, 11/09/2013  . Influenza-Unspecified 10/17/2014, 10/23/2015, 10/20/2016  . PPD Test 08/21/2013, 03/04/2014  . Pneumococcal Conjugate-13 08/21/2013  . Tdap 05/20/2008  . Zoster 11/01/2012   Pertinent  Health Maintenance Due  Topic Date Due  . DEXA SCAN  01/31/1987  . PNA vac Low Risk Adult (2 of 2 - PPSV23) 08/22/2014  . INFLUENZA VACCINE  Completed   Fall Risk  09/22/2016 08/25/2016 05/31/2016 09/02/2015 04/29/2015  Falls in the past year? Yes Yes No No No  Number falls in past yr: 1 2 or more - - -  Comment - - - - -  Injury with Fall? Yes Yes - - -  Comment fx 3 ribs - - - -  Risk Factor Category  - - - - -  Risk for fall due to : Impaired mobility;Impaired balance/gait - - - -  Risk for fall due to: Comment - - - - -    Vitals:   02/18/17 1130  BP: 113/63  Pulse: 77  Resp: 20  Temp: 97.9 F (36.6 C)  SpO2: 92%  Weight: 93 lb 6.4 oz (42.4 kg)  Height: 5\' 2"  (1.575 m)   Body mass index is 17.08  kg/m. Physical Exam  Constitutional: She is oriented to person, place, and time.  Thin frail elderly in no acute distress   HENT:  Head: Normocephalic.  Right Ear: External ear normal.  Left Ear: External ear normal.  Mouth/Throat: Oropharynx is clear and moist. No oropharyngeal exudate.  Eyes: Conjunctivae and EOM are normal. Pupils are equal, round, and reactive to light. Right eye exhibits no discharge. Left eye exhibits no discharge. No scleral icterus.  Eye glasses in place   Neck: Normal range of motion. No JVD present. No thyromegaly present.  Cardiovascular: Intact distal pulses. Exam reveals no gallop and no friction rub.  Murmur heard. Pulmonary/Chest: Effort normal and breath sounds normal. No respiratory distress. She has no wheezes. She has no rales.  Abdominal: Soft. Bowel sounds are normal. She exhibits no distension.  There is no tenderness. There is no rebound and no guarding.  Musculoskeletal: Normal range of motion. She exhibits no deformity.  Unsteady gait use walker. Left malleolus swollen and tender to touch.  Lymphadenopathy:    She has no cervical adenopathy.  Neurological: She is oriented to person, place, and time. Coordination normal.  Skin: Skin is warm and dry. No rash noted. No pallor.  Left lower inner lateral leg previous erythema no improvement site tender to touch with some edema.previous induration resolved.skin not hot to touch.    Psychiatric: She has a normal mood and affect. Her behavior is normal.    Labs reviewed: Recent Labs    12/02/16 0511 12/04/16 0220 12/07/16 0750 12/16/16  NA 138 135 135 137  K 4.2 4.2 3.9 4.8  CL 103 104 102  --   CO2 28 25 26   --   GLUCOSE 110* 89 100*  --   BUN 15 10 13 17   CREATININE 0.72 0.65 0.72 0.8  CALCIUM 8.7* 8.5* 8.9  --   MG 1.9  --  2.1  --    Recent Labs    05/12/16 1304 07/01/16 07/29/16 0740 12/04/16 0220  AST  --  22 19 24   ALT  --  11 9 13*  ALKPHOS  --  101 80 75  BILITOT  --   --  0.6 0.6  PROT 6.5  --  5.7* 5.5*  ALBUMIN  --   --  3.2* 2.7*   Recent Labs    12/01/16 1904 12/02/16 0511 12/04/16 0220 12/16/16  WBC 8.4 11.1* 9.9 6.9  NEUTROABS 6.3 8.0*  --   --   HGB 13.8 11.5* 11.6* 11.8*  HCT 42.0 35.1* 34.6* 34*  MCV 98.4 98.3 98.0  --   PLT 221 199 213 276   Lab Results  Component Value Date   TSH 1.272 12/02/2016   Lab Results  Component Value Date   HGBA1C 6.4 07/07/2015   Lab Results  Component Value Date   CHOL 152 07/29/2016   HDL 74 07/29/2016   LDLCALC 63 07/29/2016   TRIG 77 07/29/2016   CHOLHDL 2.1 07/29/2016    Significant Diagnostic Results in last 30 days:  Ct Head Wo Contrast  Result Date: 02/05/2017 CLINICAL DATA:  Patient with fall. No reported loss of consciousness. Posterior scalp hematoma. EXAM: CT HEAD WITHOUT CONTRAST TECHNIQUE: Contiguous axial images were obtained  from the base of the skull through the vertex without intravenous contrast. COMPARISON:  Brain CT 02/05/2017. FINDINGS: Brain: Ventricles and sulci are prominent compatible with atrophy. Periventricular and subcortical white matter hypodensity compatible with chronic microvascular ischemic changes. No evidence for acute cortically based  infarct, intracranial hemorrhage, mass lesion or mass-effect. Vascular: Internal carotid arterial vascular calcifications. Skull: Intact. Sinuses/Orbits: Paranasal sinuses are unremarkable. Mastoid air cells are unremarkable. Orbits are unremarkable. Other: Soft tissue swelling overlying the posterior calvarium. IMPRESSION: Soft tissue swelling overlying the posterior calvarium. No acute intracranial process. Atrophy and chronic microvascular ischemic changes. Electronically Signed   By: Lovey Newcomer M.D.   On: 02/05/2017 16:06   Ct Cervical Spine Wo Contrast  Result Date: 02/05/2017 CLINICAL DATA:  Fall with posterior scalp laceration. EXAM: CT HEAD WITHOUT CONTRAST CT CERVICAL SPINE WITHOUT CONTRAST TECHNIQUE: Multidetector CT imaging of the head and cervical spine was performed following the standard protocol without intravenous contrast. Multiplanar CT image reconstructions of the cervical spine were also generated. COMPARISON:  12/01/2016 FINDINGS: CT HEAD FINDINGS Brain: There is no evidence for acute hemorrhage, hydrocephalus, mass lesion, or abnormal extra-axial fluid collection. No definite CT evidence for acute infarction. Diffuse loss of parenchymal volume is consistent with atrophy. Diffuse loss of parenchymal volume is consistent with atrophy. patchy low attenuation in the deep hemispheric and periventricular white matter is nonspecific, but likely reflects chronic microvascular ischemic demyelination. Vascular: Atherosclerotic calcification of the carotid siphons noted at the skull base. No dense MCA sign. Skull: No evidence for fracture. No worrisome lytic or  sclerotic lesion. Sinuses/Orbits: The visualized paranasal sinuses and mastoid air cells are clear. Visualized portions of the globes and intraorbital fat are unremarkable. Other: Focal scalp swelling noted left posterior parietal region. CT CERVICAL SPINE FINDINGS Alignment: Straightening of normal cervical lordosis. Skull base and vertebrae: No acute fracture. No primary bone lesion or focal pathologic process. Soft tissues and spinal canal: No prevertebral fluid or swelling. No visible canal hematoma. Disc levels: Trace anterolisthesis of C3 on 4 is stable with bilateral facet fusion associated. Trace degenerative anterolisthesis of C4 on 5 is stable. Fusion across the C5-6 interspace is unchanged and near complete loss of disc height is seen at C6-7. Stable degenerative anterolisthesis of C7 on T1. Upper chest: Posterior biapical pleural-parenchymal scarring is similar to prior. Other: None. IMPRESSION: 1. No acute intracranial abnormality. Atrophy with chronic small vessel white matter ischemic disease. 2. Left parietal scalp swelling. 3. No evidence for an acute cervical spine fracture. 4. Stable advanced multilevel degenerative changes with stable straightening of normal cervical lordosis. Electronically Signed   By: Misty Stanley M.D.   On: 02/05/2017 13:58   Assessment/Plan   Pain and swelling of left ankle Afebrile.malleolus swollen and tender to touch.Area does not feel hot to touch.suspect possible injury from recent fall episode 02/05/2017 verse spread of previous cellulitis.Will obtain CBC/diff to rule out infectious etiologies.Portable tibia/fibula and malleolus X-ray rule out fracture.Portable due to high risk for falls.     Family/ staff Communication: Reviewed plan of care with patient and facility Nurse.   Labs/tests ordered:  CBC/diff to rule out infectious etiologies.Portable tibia/fibula and malleolus X-ray rule out fracture.    Sandrea Hughs, NP

## 2017-02-21 ENCOUNTER — Encounter: Payer: Self-pay | Admitting: *Deleted

## 2017-02-21 NOTE — Progress Notes (Signed)
Opened in error

## 2017-02-22 ENCOUNTER — Non-Acute Institutional Stay: Payer: Medicare Other | Admitting: Family

## 2017-02-22 DIAGNOSIS — M10072 Idiopathic gout, left ankle and foot: Secondary | ICD-10-CM

## 2017-02-22 DIAGNOSIS — M79605 Pain in left leg: Secondary | ICD-10-CM

## 2017-02-22 NOTE — Progress Notes (Addendum)
Location:  [facility] Room Number: 1 Place of Service:  ALF [phone]) Provider: Dinah [name] FNP-C  Blanchie Serve, MD  Patient Care Team: Blanchie Serve, MD as PCP - General (Internal Medicine) Angelia Mould, MD as Attending Physician (Vascular Surgery) [name], MD as Attending Physician (Obstetrics and Gynecology) [name], MD (Orthopedic Surgery) [name], MD (Ophthalmology) [name], MD as Consulting Physician (Urology) [name], MD as Consulting Physician (Cardiology) [name], PA-C as Physician Assistant (Otolaryngology) [name], MD as Consulting Physician (Otolaryngology) [name], MD as Consulting Physician (Dermatology) [name], MD as Consulting Physician (Physical Medicine and Rehabilitation) [name], MD as Attending Physician (Gastroenterology) Mast, Man X, NP as Nurse Practitioner (Internal Medicine) Kathrynn Ducking, MD as Consulting Physician (Neurology) Debara Pickett Nadean Corwin, MD as Consulting Physician (Cardiology) Druscilla Brownie, MD as Consulting Physician (Dermatology)  Extended Emergency Contact Information Primary Emergency Contact: [name] of [place] Phone: [phone] Mobile Phone: [phone] Relation: Son  Code Status:  DNR Goals of care: Advanced Directive information Advanced Directives 02/07/2017  Does Patient Have a Medical Advance Directive? Yes  Type of Paramedic of [place];Living will  Does patient want to make changes to medical advance directive? No - Patient declined  Copy of [place] in Chart? Yes  Would patient like information on creating a medical advance directive? -     Chief Complaint  Patient presents with  . Acute Visit    left leg pain     HPI:  Pt is a 84 y.o. female seen today at [facility] for an acute visit for evaluation of leg pain and swelling of  ankle.she is seen in her room today per facility Nurse request.she states her left leg continues to be red and very tender to touch now affecting her activity of daily living.left ankle swelling also persist.she denies any fever or chills. Recent lab results WBC 7.8,Sed rate and Uric acid within normal range.X-ray of left tibial/fibula and malleolus was negative for fracture.  Past Medical History:  Diagnosis Date  . Abdominal bloating   . Atrial fibrillation ([place])   . Bruises easily   . Carotid artery occlusion    LEFT  . Dizziness   . DOE (dyspnea on exertion) 04/02/2014  . Fall at home Sept 2013, Dec. 2013  Jun 08, 2012  . GERD (gastroesophageal reflux disease) 02/11/2015  . Headache(784.0)   . Hoarseness   . Hypercholesterolemia   . Hyperglycemia 04/02/2014   Glucose 178 mg percent on 01/22/2014 04/05/14 Hgb A1c 6.6 Diet controlled.    . Hypothyroidism 04/15/2015  . Neuropathy    PERIPHERAL  . Pruritus   . Scoliosis   . Varicose veins    Past Surgical History:  Procedure Laterality Date  . ABDOMINAL HYSTERECTOMY  1954  . CHOLECYSTECTOMY  1997  . ESOPHAGOGASTRODUODENOSCOPY (EGD) WITH PROPOFOL N/A 11/25/2016   Procedure: ESOPHAGOGASTRODUODENOSCOPY (EGD) WITH PROPOFOL;  Surgeon: [name], MD;  Location: [facility];  Service: Endoscopy;  Laterality: N/A;  . [place]   correct scoliosis    No Known Allergies  Outpatient Encounter Medications as of 02/22/2017  Medication Sig  . acetaminophen (TYLENOL) 500 MG tablet Take 500 mg by mouth 2 (two) times daily.   Marland Kitchen antiseptic oral rinse (BIOTENE) LIQD 15 mLs by Mouth Rinse route as needed for dry mouth.  Marland Kitchen aspirin EC 81 MG tablet Take 81 mg daily by mouth.  Marland Kitchen atenolol (  TENORMIN) 25 MG tablet Take 0.5 tablets (12.5 mg total) by mouth 2 (two) times daily.  . DULoxetine (CYMBALTA) 20 MG capsule TAKE 1 CAPSULE AT BEDTIME.  . fluticasone (FLONASE) 50 MCG/ACT nasal spray Place 2 sprays into both nostrils daily.  Marland Kitchen  lactose free nutrition (BOOST) LIQD Take 237 mLs by mouth 3 (three) times daily between meals.  . multivitamin-iron-minerals-folic acid (CENTRUM) chewable tablet Chew 1 tablet daily by mouth.  . ondansetron (ZOFRAN) 4 MG tablet Take 1 tablet (4 mg total) by mouth every 6 (six) hours as needed for nausea.  . polyethylene glycol (MIRALAX / GLYCOLAX) packet Take 17 g daily as needed by mouth (for constipation).   . simvastatin (ZOCOR) 40 MG tablet Take 20 mg at bedtime by mouth.   . traMADol (ULTRAM) 50 MG tablet Take 1 tablet (50 mg total) by mouth every 6 (six) hours as needed for moderate pain. (Patient taking differently: Take 50 mg by mouth every 8 (eight) hours as needed for moderate pain. )  . UNABLE TO FIND Med Name: Biotin 5 mg tablet by mouth daily   No facility-administered encounter medications on file as of 02/22/2017.     Review of Systems  Constitutional: Negative for chills, fatigue and fever.  Respiratory: Negative for cough, chest tightness, shortness of breath and wheezing.   Cardiovascular: Negative for chest pain and palpitations.       Left leg ankle swelling   Gastrointestinal: Negative for abdominal distention, abdominal pain, constipation, diarrhea, nausea and vomiting.  Musculoskeletal: Positive for arthralgias and gait problem.  Skin: Negative for color change, pallor and rash.  Neurological: Negative for dizziness, weakness, light-headedness and headaches.  Psychiatric/Behavioral: Negative for agitation, confusion and sleep disturbance. The patient is not nervous/anxious.     Immunization History  Administered Date(s) Administered  . Influenza Split 10/20/2011, 11/03/2012  . Influenza,inj,Quad PF,6+ Mos 09/29/2012, 11/09/2013  . Influenza-Unspecified 10/17/2014, 10/23/2015, 10/20/2016  . PPD Test 08/21/2013, 03/04/2014  . Pneumococcal Conjugate-13 08/21/2013  . Tdap 05/20/2008  . Zoster 11/01/2012   Pertinent  Health Maintenance Due  Topic Date Due  . DEXA  SCAN  01/31/1987  . PNA vac Low Risk Adult (2 of 2 - PPSV23) 08/22/2014  . INFLUENZA VACCINE  Completed   Fall Risk  09/22/2016 08/25/2016 05/31/2016 09/02/2015 04/29/2015  Falls in the past year? Yes Yes No No No  Number falls in past yr: 1 2 or more - - -  Comment - - - - -  Injury with Fall? Yes Yes - - -  Comment fx 3 ribs - - - -  Risk Factor Category  - - - - -  Risk for fall due to : Impaired mobility;Impaired balance/gait - - - -  Risk for fall due to: Comment - - - - -    Vitals:   02/22/17 1214  BP: (!) 106/52  Pulse: 78  Resp: 20  Temp: 97.9 F (36.6 C)  SpO2: 92%  Weight: 93 lb 6.4 oz (42.4 kg)  Height: 5\' 2"  (1.575 m)   Body mass index is 17.08 kg/m. Physical Exam  Constitutional: She is oriented to person, place, and time.  Thin, frail elderly in no acute   HENT:  Head: Normocephalic.  Mouth/Throat: Oropharynx is clear and moist. No oropharyngeal exudate.  Eyes: Conjunctivae and EOM are normal. Pupils are equal, round, and reactive to light. Right eye exhibits no discharge. Left eye exhibits no discharge. No scleral icterus.  Neck: Normal range of motion. No JVD present.  No thyromegaly present.  Cardiovascular: Intact distal pulses. Exam reveals no gallop and no friction rub.  Murmur heard. Pulmonary/Chest: Effort normal and breath sounds normal. No respiratory distress. She has no wheezes. She has no rales.  Abdominal: Soft. Bowel sounds are normal. She exhibits no distension. There is no tenderness. There is no rebound and no guarding.  Musculoskeletal: She exhibits edema and tenderness.  Unsteady gait uses FWW.moves x 4 extremities without any difficulties.left malleolus edema and medial leg tender to touch.   Lymphadenopathy:    She has no cervical adenopathy.  Neurological: She is oriented to person, place, and time. Coordination normal.  Skin: Skin is warm and dry. No rash noted. No pallor.  Left medial leg/malleolus area pink in color compared to surround  skin tissue.Area very tender to touch.   Psychiatric: She has a normal mood and affect.   Labs reviewed: Recent Labs    12/02/16 0511 12/04/16 0220 12/07/16 0750 12/16/16  NA 138 135 135 137  K 4.2 4.2 3.9 4.8  CL 103 104 102  --   CO2 28 25 26   --   GLUCOSE 110* 89 100*  --   BUN 15 10 13 17   CREATININE 0.72 0.65 0.72 0.8  CALCIUM 8.7* 8.5* 8.9  --   MG 1.9  --  2.1  --    Recent Labs    05/12/16 1304 07/01/16 07/29/16 0740 12/04/16 0220  AST  --  22 19 24   ALT  --  11 9 13*  ALKPHOS  --  101 80 75  BILITOT  --   --  0.6 0.6  PROT 6.5  --  5.7* 5.5*  ALBUMIN  --   --  3.2* 2.7*   Recent Labs    12/01/16 1904 12/02/16 0511 12/04/16 0220 12/16/16 02/18/17  WBC 8.4 11.1* 9.9 6.9 7.8  NEUTROABS 6.3 8.0*  --   --   --   HGB 13.8 11.5* 11.6* 11.8* 13.5  HCT 42.0 35.1* 34.6* 34* 38.9  MCV 98.4 98.3 98.0  --   --   PLT 221 199 213 276  --    Lab Results  Component Value Date   TSH 1.272 12/02/2016   Lab Results  Component Value Date   HGBA1C 6.4 07/07/2015   Lab Results  Component Value Date   CHOL 152 07/29/2016   HDL 74 07/29/2016   LDLCALC 63 07/29/2016   TRIG 77 07/29/2016   CHOLHDL 2.1 07/29/2016    Significant Diagnostic Results in last 30 days:  Ct Head Wo Contrast  Result Date: 02/05/2017 CLINICAL DATA:  Patient with fall. No reported loss of consciousness. Posterior scalp hematoma. EXAM: CT HEAD WITHOUT CONTRAST TECHNIQUE: Contiguous axial images were obtained from the base of the skull through the vertex without intravenous contrast. COMPARISON:  Brain CT 02/05/2017. FINDINGS: Brain: Ventricles and sulci are prominent compatible with atrophy. Periventricular and subcortical white matter hypodensity compatible with chronic microvascular ischemic changes. No evidence for acute cortically based infarct, intracranial hemorrhage, mass lesion or mass-effect. Vascular: Internal carotid arterial vascular calcifications. Skull: Intact. Sinuses/Orbits:  Paranasal sinuses are unremarkable. Mastoid air cells are unremarkable. Orbits are unremarkable. Other: Soft tissue swelling overlying the posterior calvarium. IMPRESSION: Soft tissue swelling overlying the posterior calvarium. No acute intracranial process. Atrophy and chronic microvascular ischemic changes. Electronically Signed   By: Lovey Newcomer M.D.   On: 02/05/2017 16:06   Ct Cervical Spine Wo Contrast  Result Date: 02/05/2017 CLINICAL DATA:  Fall with posterior scalp  laceration. EXAM: CT HEAD WITHOUT CONTRAST CT CERVICAL SPINE WITHOUT CONTRAST TECHNIQUE: Multidetector CT imaging of the head and cervical spine was performed following the standard protocol without intravenous contrast. Multiplanar CT image reconstructions of the cervical spine were also generated. COMPARISON:  12/01/2016 FINDINGS: CT HEAD FINDINGS Brain: There is no evidence for acute hemorrhage, hydrocephalus, mass lesion, or abnormal extra-axial fluid collection. No definite CT evidence for acute infarction. Diffuse loss of parenchymal volume is consistent with atrophy. Diffuse loss of parenchymal volume is consistent with atrophy. patchy low attenuation in the deep hemispheric and periventricular white matter is nonspecific, but likely reflects chronic microvascular ischemic demyelination. Vascular: Atherosclerotic calcification of the carotid siphons noted at the skull base. No dense MCA sign. Skull: No evidence for fracture. No worrisome lytic or sclerotic lesion. Sinuses/Orbits: The visualized paranasal sinuses and mastoid air cells are clear. Visualized portions of the globes and intraorbital fat are unremarkable. Other: Focal scalp swelling noted left posterior parietal region. CT CERVICAL SPINE FINDINGS Alignment: Straightening of normal cervical lordosis. Skull base and vertebrae: No acute fracture. No primary bone lesion or focal pathologic process. Soft tissues and spinal canal: No prevertebral fluid or swelling. No visible canal  hematoma. Disc levels: Trace anterolisthesis of C3 on 4 is stable with bilateral facet fusion associated. Trace degenerative anterolisthesis of C4 on 5 is stable. Fusion across the C5-6 interspace is unchanged and near complete loss of disc height is seen at C6-7. Stable degenerative anterolisthesis of C7 on T1. Upper chest: Posterior biapical pleural-parenchymal scarring is similar to prior. Other: None. IMPRESSION: 1. No acute intracranial abnormality. Atrophy with chronic small vessel white matter ischemic disease. 2. Left parietal scalp swelling. 3. No evidence for an acute cervical spine fracture. 4. Stable advanced multilevel degenerative changes with stable straightening of normal cervical lordosis. Electronically Signed   By: Misty Stanley M.D.   On: 02/05/2017 13:58   Assessment/Plan 1. Left leg pain  Afebrile.Left  inner malleolus along medial aspect of leg very tender to touch.Skin pink in color no change from previous marked area.skin not hot/warm to touch. Recent WBC 7.8 (02/18/2017).Left Tibial/fibula and malleolus x-ray was negative for fracture.discussed with Dr.Pandey patient's continuous leg tenderness and pink color.will schedule Tramadol 50 mg tablet twice daily x 3 days then resume previous PRN order.Follow up with MD in 2 days.    2. Acute idiopathic gout of left ankle Left ankle swelling, pink color and tender to touch highly suspicion for gout.Recent lab result sed rate and uric acid negative.Possible due to acute inflammatory process.X-ray negative for fracture.MD recommend prednisone 40 mg tablet daily x 5 days then re-evaluate.Follow up uric Acid in 2 weeks.    Family/ staff Communication: Reviewed plan of care with Dr. Mikeal Hawthorne and facility Nurse.   Labs/tests ordered: Uric Acid in 2 weeks.    Sandrea Hughs, NP

## 2017-02-23 DIAGNOSIS — R2681 Unsteadiness on feet: Secondary | ICD-10-CM | POA: Diagnosis not present

## 2017-02-23 DIAGNOSIS — Z9181 History of falling: Secondary | ICD-10-CM | POA: Diagnosis not present

## 2017-02-23 DIAGNOSIS — M6281 Muscle weakness (generalized): Secondary | ICD-10-CM | POA: Diagnosis not present

## 2017-02-24 DIAGNOSIS — M6281 Muscle weakness (generalized): Secondary | ICD-10-CM | POA: Diagnosis not present

## 2017-02-24 DIAGNOSIS — R2681 Unsteadiness on feet: Secondary | ICD-10-CM | POA: Diagnosis not present

## 2017-02-24 DIAGNOSIS — Z9181 History of falling: Secondary | ICD-10-CM | POA: Diagnosis not present

## 2017-02-25 ENCOUNTER — Non-Acute Institutional Stay: Payer: Medicare Other | Admitting: Internal Medicine

## 2017-02-25 ENCOUNTER — Encounter: Payer: Self-pay | Admitting: Internal Medicine

## 2017-02-25 DIAGNOSIS — M10072 Idiopathic gout, left ankle and foot: Secondary | ICD-10-CM | POA: Diagnosis not present

## 2017-02-25 DIAGNOSIS — M79605 Pain in left leg: Secondary | ICD-10-CM

## 2017-02-25 DIAGNOSIS — Z9181 History of falling: Secondary | ICD-10-CM | POA: Diagnosis not present

## 2017-02-25 DIAGNOSIS — M25562 Pain in left knee: Secondary | ICD-10-CM | POA: Diagnosis not present

## 2017-02-25 DIAGNOSIS — M6281 Muscle weakness (generalized): Secondary | ICD-10-CM | POA: Diagnosis not present

## 2017-02-25 DIAGNOSIS — R2681 Unsteadiness on feet: Secondary | ICD-10-CM | POA: Diagnosis not present

## 2017-02-25 NOTE — Progress Notes (Signed)
[facility]  Provider: Blanchie Serve MD   Location:  [place] Room Number: 1 Place of Service:  ALF (13)  PCP: Blanchie Serve, MD Patient Care Team: Blanchie Serve, MD as PCP - General (Internal Medicine) Angelia Mould, MD as Attending Physician (Vascular Surgery) [name], MD as Attending Physician (Obstetrics and Gynecology) [name], MD (Orthopedic Surgery) [name], MD (Ophthalmology) [name], MD as Consulting Physician (Urology) [name], MD as Consulting Physician (Cardiology) [name], PA-C as Physician Assistant (Otolaryngology) [name], MD as Consulting Physician (Otolaryngology) [name], MD as Consulting Physician (Dermatology) [name], MD as Consulting Physician (Physical Medicine and Rehabilitation) [name], MD as Attending Physician (Gastroenterology) Mast, Man X, NP as Nurse Practitioner (Internal Medicine) Kathrynn Ducking, MD as Consulting Physician (Neurology) Debara Pickett Nadean Corwin, MD as Consulting Physician (Cardiology) Druscilla Brownie, MD as Consulting Physician (Dermatology)  Extended Emergency Contact Information Primary Emergency Contact: [name] of [place] Phone: [phone] Mobile Phone: [phone] Relation: Son  Code Status: full code  Goals of Care: Advanced Directive information Advanced Directives 02/25/2017  Does Patient Have a Medical Advance Directive? Yes  Type of Paramedic of [place];Living will  Does patient want to make changes to medical advance directive? No - Patient declined  Copy of [place] in Chart? Yes  Would patient like information on creating a medical advance directive? -      Chief Complaint  Patient presents with  . Acute Visit    left leg pain    HPI: Patient is a 84 y.o. female seen today for acute visit for leg pain. She had  left ankle swelling with pain that was tender to touch and some tenderness to left medial lower leg area since 02/17/17. She was seen by my NP, fracture and cellulitis were ruled out. With concern for possible gout attack, she was placed on prednisone 40 mg daily x 5 days and scheduled tramadol. She is seen in her room today. She mentions that pain and swelling have improved but not resolved. She complaints of some tenderness to touch to left lower leg area. Patient remains afebrile. Denies any new problem with weight bearing or mobility. She bumped her left knee on her chair 2 days back and has noticed pain there.   Past Medical History:  Diagnosis Date  . Abdominal bloating   . Atrial fibrillation ([place])   . Bruises easily   . Carotid artery occlusion    LEFT  . Dizziness   . DOE (dyspnea on exertion) 04/02/2014  . Fall at home Sept 2013, Dec. 2013  Jun 08, 2012  . GERD (gastroesophageal reflux disease) 02/11/2015  . Headache(784.0)   . Hoarseness   . Hypercholesterolemia   . Hyperglycemia 04/02/2014   Glucose 178 mg percent on 01/22/2014 04/05/14 Hgb A1c 6.6 Diet controlled.    . Hypothyroidism 04/15/2015  . Neuropathy    PERIPHERAL  . Pruritus   . Scoliosis   . Varicose veins    Past Surgical History:  Procedure Laterality Date  . ABDOMINAL HYSTERECTOMY  1954  . CHOLECYSTECTOMY  1997  . ESOPHAGOGASTRODUODENOSCOPY (EGD) WITH PROPOFOL N/A 11/25/2016   Procedure: ESOPHAGOGASTRODUODENOSCOPY (EGD) WITH PROPOFOL;  Surgeon: [name], MD;  Location: [facility];  Service: Endoscopy;  Laterality: N/A;  . [place]   correct scoliosis    reports that  has never smoked. she has never used smokeless  tobacco. She reports that she does not drink alcohol or use drugs. Social History   Socioeconomic History  . Marital status: Widowed    Spouse name: Not on file  . Number of children: 2  . Years of education: 12+  . Highest education level: Not on file  Social Needs  .  Financial resource strain: Not on file  . Food insecurity - worry: Not on file  . Food insecurity - inability: Not on file  . Transportation needs - medical: Not on file  . Transportation needs - non-medical: Not on file  Occupational History  . 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 Smoking status: Never Smoker  . Smokeless tobacco: Never Used  Substance and Sexual Activity  . Alcohol use: No    Alcohol/week: 0.0 oz  . Drug use: No  . Sexual activity: No    Birth control/protection: None  Other Topics Concern  . Not on file  Social History Narrative   Patient lives at [facility] 03/05/[facility]   Patient is a widowed.    Patient is retired.    Patient has no children but adopted twin sons.    Patient has a college degree.    Never smoked   Alcohol none   Exercise with therapy   Walks with cane   POA       Patient only drinks de-caf drinks.   Patient is right handed.     Family History  Adopted: Yes  Problem Relation Age of Onset  . Diabetes Mother   . Heart attack Mother   . Heart disease Mother        After age 26  . Heart attack Father   . Stroke Father   . Breast cancer Sister   . Heart disease Maternal Grandmother   . Cancer Son        adopted son. esophagus, stomach, liver  . Stomach cancer Neg Hx   . Colon cancer Neg Hx     Health Maintenance  Topic Date Due  . DEXA SCAN  01/31/1987  . PNA vac Low Risk Adult (2 of 2 - PPSV23) 08/22/2014  . TETANUS/TDAP  05/21/2018  . INFLUENZA VACCINE  Completed    No Known Allergies  Outpatient Encounter Medications as of 02/25/2017  Medication Sig  . acetaminophen (TYLENOL) 500 MG tablet Take 500 mg by mouth 2 (two) times daily.   Marland Kitchen antiseptic oral rinse (BIOTENE) LIQD 15 mLs by Mouth Rinse route as needed for dry mouth.  Marland Kitchen aspirin EC 81 MG tablet Take 81 mg daily by mouth.  Marland Kitchen atenolol (TENORMIN) 25 MG tablet Take 0.5 tablets (12.5 mg total) by mouth  2 (two) times daily.  . Biotin 5000 MCG TABS Take 5,000 mcg by mouth daily.  . DULoxetine (CYMBALTA) 20 MG capsule TAKE 1 CAPSULE AT BEDTIME.  . fluticasone (FLONASE) 50 MCG/ACT nasal spray Place 2 sprays into both nostrils daily.  . furosemide (LASIX) 40 MG tablet Take 40 mg by mouth daily.  Marland Kitchen lactose free nutrition (BOOST) LIQD Take 237 mLs by mouth 3 (three) times daily between meals.  . multivitamin-iron-minerals-folic acid (CENTRUM) chewable tablet Chew 1 tablet daily by mouth.  . ondansetron (ZOFRAN) 4 MG tablet Take 1 tablet (4 mg total) by mouth every 6 (six) hours as needed for nausea.  . polyethylene glycol (MIRALAX / GLYCOLAX) packet Take 17 g daily as needed by mouth (for constipation).   . simvastatin (ZOCOR) 40  MG tablet Take 20 mg at bedtime by mouth.   . traMADol (ULTRAM) 50 MG tablet Take 50 mg by mouth every 8 (eight) hours as needed.  . traMADol (ULTRAM) 50 MG tablet Take 50 mg by mouth 2 (two) times daily. Stop date 02/25/17  . [DISCONTINUED] traMADol (ULTRAM) 50 MG tablet Take 1 tablet (50 mg total) by mouth every 6 (six) hours as needed for moderate pain. (Patient taking differently: Take 50 mg by mouth every 8 (eight) hours as needed for moderate pain. )  . [DISCONTINUED] UNABLE TO FIND Med Name: Biotin 5 mg tablet by mouth daily   No facility-administered encounter medications on file as of 02/25/2017.     Review of Systems  Constitutional: Negative for chills and fever.  Respiratory: Negative for shortness of breath.   Cardiovascular: Negative for chest pain and leg swelling.  Musculoskeletal: Positive for gait problem. Negative for back pain and myalgias.    Vitals:   02/25/17 0943  BP: 106/66  Pulse: 86  Resp: 18  Temp: 98 F (36.7 C)  TempSrc: Oral  SpO2: 97%  Weight: 93 lb 6.4 oz (42.4 kg)  Height: 5\' 2"  (1.575 m)   Body mass index is 17.08 kg/m. Physical Exam  Constitutional: She is oriented to person, place, and time. No distress.  Thin built and  frail  HENT:  Head: Normocephalic and atraumatic.  Mouth/Throat: Oropharynx is clear and moist.  Eyes: EOM are normal.  Neck: Neck supple.  Cardiovascular: Normal rate and regular rhythm.  Musculoskeletal:  Trace left ankle edema, non tender, resolving erythema, tender to touch to left lower medial leg area, no redness, normal temp to touch, no signs of infection, skin intact. Left knee bruise with hematoma noted. Varicose veins to both legs.   Lymphadenopathy:    She has no cervical adenopathy.  Neurological: She is alert and oriented to person, place, and time.  Skin: Skin is warm and dry. She is not diaphoretic.  Psychiatric: She has a normal mood and affect.    Labs reviewed: Basic Metabolic Panel: Recent Labs    12/02/16 0511 12/04/16 0220 12/07/16 0750 12/16/16  NA 138 135 135 137  K 4.2 4.2 3.9 4.8  CL 103 104 102  --   CO2 28 25 26   --   GLUCOSE 110* 89 100*  --   BUN 15 10 13 17   CREATININE 0.72 0.65 0.72 0.8  CALCIUM 8.7* 8.5* 8.9  --   MG 1.9  --  2.1  --    Liver Function Tests: Recent Labs    05/12/16 1304 07/01/16 07/29/16 0740 12/04/16 0220  AST  --  22 19 24   ALT  --  11 9 13*  ALKPHOS  --  101 80 75  BILITOT  --   --  0.6 0.6  PROT 6.5  --  5.7* 5.5*  ALBUMIN  --   --  3.2* 2.7*   No results for input(s): LIPASE, AMYLASE in the last 8760 hours. No results for input(s): AMMONIA in the last 8760 hours. CBC: Recent Labs    12/01/16 1904 12/02/16 0511 12/04/16 0220 12/16/16 02/18/17  WBC 8.4 11.1* 9.9 6.9 7.8  7.8  NEUTROABS 6.3 8.0*  --   --   --   HGB 13.8 11.5* 11.6* 11.8* 13.5  13.5  HCT 42.0 35.1* 34.6* 34* 39  38.9  MCV 98.4 98.3 98.0  --   --   PLT 221 199 213 276 256   Cardiac Enzymes:  Recent Labs    12/02/16 0511  TROPONINI 0.04*   BNP: Invalid input(s): POCBNP Lab Results  Component Value Date   HGBA1C 6.4 07/07/2015   Lab Results  Component Value Date   TSH 1.272 12/02/2016   Lab Results  Component Value Date    UKGURKYH06 237 05/12/2016   No results found for: FOLATE No results found for: IRON, TIBC, FERRITIN  Lipid Panel: Recent Labs    07/22/16 07/29/16 0740 02/10/17  CHOL 170 152 151  HDL 85* 74 76*  LDLCALC 65 63 54  TRIG 112 77 125  CHOLHDL  --  2.1  --    Lab Results  Component Value Date   HGBA1C 6.4 07/07/2015    Procedures since last visit: Ct Head Wo Contrast  Result Date: 02/05/2017 CLINICAL DATA:  Patient with fall. No reported loss of consciousness. Posterior scalp hematoma. EXAM: CT HEAD WITHOUT CONTRAST TECHNIQUE: Contiguous axial images were obtained from the base of the skull through the vertex without intravenous contrast. COMPARISON:  Brain CT 02/05/2017. FINDINGS: Brain: Ventricles and sulci are prominent compatible with atrophy. Periventricular and subcortical white matter hypodensity compatible with chronic microvascular ischemic changes. No evidence for acute cortically based infarct, intracranial hemorrhage, mass lesion or mass-effect. Vascular: Internal carotid arterial vascular calcifications. Skull: Intact. Sinuses/Orbits: Paranasal sinuses are unremarkable. Mastoid air cells are unremarkable. Orbits are unremarkable. Other: Soft tissue swelling overlying the posterior calvarium. IMPRESSION: Soft tissue swelling overlying the posterior calvarium. No acute intracranial process. Atrophy and chronic microvascular ischemic changes. Electronically Signed   By: Lovey Newcomer M.D.   On: 02/05/2017 16:06   Ct Cervical Spine Wo Contrast  Result Date: 02/05/2017 CLINICAL DATA:  Fall with posterior scalp laceration. EXAM: CT HEAD WITHOUT CONTRAST CT CERVICAL SPINE WITHOUT CONTRAST TECHNIQUE: Multidetector CT imaging of the head and cervical spine was performed following the standard protocol without intravenous contrast. Multiplanar CT image reconstructions of the cervical spine were also generated. COMPARISON:  12/01/2016 FINDINGS: CT HEAD FINDINGS Brain: There is no evidence for  acute hemorrhage, hydrocephalus, mass lesion, or abnormal extra-axial fluid collection. No definite CT evidence for acute infarction. Diffuse loss of parenchymal volume is consistent with atrophy. Diffuse loss of parenchymal volume is consistent with atrophy. patchy low attenuation in the deep hemispheric and periventricular white matter is nonspecific, but likely reflects chronic microvascular ischemic demyelination. Vascular: Atherosclerotic calcification of the carotid siphons noted at the skull base. No dense MCA sign. Skull: No evidence for fracture. No worrisome lytic or sclerotic lesion. Sinuses/Orbits: The visualized paranasal sinuses and mastoid air cells are clear. Visualized portions of the globes and intraorbital fat are unremarkable. Other: Focal scalp swelling noted left posterior parietal region. CT CERVICAL SPINE FINDINGS Alignment: Straightening of normal cervical lordosis. Skull base and vertebrae: No acute fracture. No primary bone lesion or focal pathologic process. Soft tissues and spinal canal: No prevertebral fluid or swelling. No visible canal hematoma. Disc levels: Trace anterolisthesis of C3 on 4 is stable with bilateral facet fusion associated. Trace degenerative anterolisthesis of C4 on 5 is stable. Fusion across the C5-6 interspace is unchanged and near complete loss of disc height is seen at C6-7. Stable degenerative anterolisthesis of C7 on T1. Upper chest: Posterior biapical pleural-parenchymal scarring is similar to prior. Other: None. IMPRESSION: 1. No acute intracranial abnormality. Atrophy with chronic small vessel white matter ischemic disease. 2. Left parietal scalp swelling. 3. No evidence for an acute cervical spine fracture. 4. Stable advanced multilevel degenerative changes with stable straightening of normal  cervical lordosis. Electronically Signed   By: Misty Stanley M.D.   On: 02/05/2017 13:58    Assessment/Plan  Left knee pain With bruise and small hematoma. Tender  to touch. Ice pack three times a day as needed advised. Continue tylenol 500 mg bid and prn tramadol for now.   Gout Improved swelling and redness. Complete prednisone course. Pending uric acid level in 1 week.  Left leg pain With tenderness to touch to left lower leg area. Calf measurement equal in both legs, Homan's sign negative, no clinical signs of infection, blood work stable. ? Acute gout vs varicose vein contributing to pain. Supportive care for now.    Labs/tests ordered:  Uric acid pending  Communication: reviewed care plan with patient and charge nurse.     Blanchie Serve, MD Internal Medicine [facility] [address] [place], [place] [address] Cell Phone (Monday-Friday 8 am - 5 pm): [phone] On Call: [phone] and follow prompts after 5 pm and on weekends Office Phone: [phone] Office Fax: [phone]

## 2017-03-02 DIAGNOSIS — R2681 Unsteadiness on feet: Secondary | ICD-10-CM | POA: Diagnosis not present

## 2017-03-02 DIAGNOSIS — M6281 Muscle weakness (generalized): Secondary | ICD-10-CM | POA: Diagnosis not present

## 2017-03-02 DIAGNOSIS — Z9181 History of falling: Secondary | ICD-10-CM | POA: Diagnosis not present

## 2017-03-03 DIAGNOSIS — Z9181 History of falling: Secondary | ICD-10-CM | POA: Diagnosis not present

## 2017-03-03 DIAGNOSIS — R2681 Unsteadiness on feet: Secondary | ICD-10-CM | POA: Diagnosis not present

## 2017-03-03 DIAGNOSIS — M6281 Muscle weakness (generalized): Secondary | ICD-10-CM | POA: Diagnosis not present

## 2017-03-04 DIAGNOSIS — M6281 Muscle weakness (generalized): Secondary | ICD-10-CM | POA: Diagnosis not present

## 2017-03-04 DIAGNOSIS — Z9181 History of falling: Secondary | ICD-10-CM | POA: Diagnosis not present

## 2017-03-04 DIAGNOSIS — R2681 Unsteadiness on feet: Secondary | ICD-10-CM | POA: Diagnosis not present

## 2017-03-07 ENCOUNTER — Encounter: Payer: Self-pay | Admitting: *Deleted

## 2017-03-07 DIAGNOSIS — I5022 Chronic systolic (congestive) heart failure: Secondary | ICD-10-CM | POA: Diagnosis not present

## 2017-03-07 DIAGNOSIS — I482 Chronic atrial fibrillation: Secondary | ICD-10-CM | POA: Diagnosis not present

## 2017-03-07 DIAGNOSIS — I10 Essential (primary) hypertension: Secondary | ICD-10-CM | POA: Diagnosis not present

## 2017-03-07 DIAGNOSIS — E7800 Pure hypercholesterolemia, unspecified: Secondary | ICD-10-CM | POA: Diagnosis not present

## 2017-03-07 LAB — BASIC METABOLIC PANEL
Creatinine: 0.8 (ref 0.5–1.1)
Glucose: 93
Potassium: 4.9 (ref 3.4–5.3)
Sodium: 140 (ref 137–147)

## 2017-03-07 LAB — URIC ACID: Uric Acid: 4.4

## 2017-03-07 LAB — COMPLETE METABOLIC PANEL WITH GFR
ALT: 13
AST: 22
Albumin: 3.3
Alkaline Phosphatase: 71
BUN: 14 (ref 4–21)
Calcium: 9.1
Creat: 0.77
EGFR (Non-African Amer.): 66
Glucose: 93
Potassium: 4.9
Sodium: 140
Total Bilirubin: 0.6
Total Protein: 5.7 g/dL

## 2017-03-07 LAB — CBC
HCT: 34
HGB: 11.9
WBC: 6.9
platelet count: 199

## 2017-03-07 LAB — HEPATIC FUNCTION PANEL
ALT: 13 (ref 7–35)
AST: 22 (ref 13–35)
Alkaline Phosphatase: 71 (ref 25–125)
Bilirubin, Total: 0.6

## 2017-03-09 DIAGNOSIS — M6281 Muscle weakness (generalized): Secondary | ICD-10-CM | POA: Diagnosis not present

## 2017-03-09 DIAGNOSIS — R2681 Unsteadiness on feet: Secondary | ICD-10-CM | POA: Diagnosis not present

## 2017-03-09 DIAGNOSIS — Z9181 History of falling: Secondary | ICD-10-CM | POA: Diagnosis not present

## 2017-03-10 DIAGNOSIS — M6281 Muscle weakness (generalized): Secondary | ICD-10-CM | POA: Diagnosis not present

## 2017-03-10 DIAGNOSIS — R2681 Unsteadiness on feet: Secondary | ICD-10-CM | POA: Diagnosis not present

## 2017-03-10 DIAGNOSIS — Z9181 History of falling: Secondary | ICD-10-CM | POA: Diagnosis not present

## 2017-03-11 DIAGNOSIS — R2681 Unsteadiness on feet: Secondary | ICD-10-CM | POA: Diagnosis not present

## 2017-03-11 DIAGNOSIS — R29898 Other symptoms and signs involving the musculoskeletal system: Secondary | ICD-10-CM | POA: Diagnosis not present

## 2017-03-11 DIAGNOSIS — M6281 Muscle weakness (generalized): Secondary | ICD-10-CM | POA: Diagnosis not present

## 2017-03-11 DIAGNOSIS — Z9181 History of falling: Secondary | ICD-10-CM | POA: Diagnosis not present

## 2017-03-16 ENCOUNTER — Non-Acute Institutional Stay: Payer: Medicare Other | Admitting: Family

## 2017-03-16 DIAGNOSIS — Y92129 Unspecified place in nursing home as the place of occurrence of the external cause: Secondary | ICD-10-CM | POA: Diagnosis not present

## 2017-03-16 DIAGNOSIS — R2681 Unsteadiness on feet: Secondary | ICD-10-CM | POA: Diagnosis not present

## 2017-03-16 DIAGNOSIS — W19XXXA Unspecified fall, initial encounter: Secondary | ICD-10-CM

## 2017-03-16 DIAGNOSIS — M6281 Muscle weakness (generalized): Secondary | ICD-10-CM | POA: Diagnosis not present

## 2017-03-16 DIAGNOSIS — Z9181 History of falling: Secondary | ICD-10-CM | POA: Diagnosis not present

## 2017-03-16 DIAGNOSIS — R29898 Other symptoms and signs involving the musculoskeletal system: Secondary | ICD-10-CM | POA: Diagnosis not present

## 2017-03-16 NOTE — Progress Notes (Signed)
Location:  [place] Room Number: 1 Place of Service:  ALF [phone]) Provider: Kate Sweetman FNP-C  Blanchie Serve, MD  Patient Care Team: Blanchie Serve, MD as PCP - General (Internal Medicine) Angelia Mould, MD as Attending Physician (Vascular Surgery) [name], MD as Attending Physician (Obstetrics and Gynecology) [name], MD (Orthopedic Surgery) [name], MD (Ophthalmology) [name], MD as Consulting Physician (Urology) [name], MD as Consulting Physician (Cardiology) [name], PA-C as Physician Assistant (Otolaryngology) [name], MD as Consulting Physician (Otolaryngology) [name], MD as Consulting Physician (Dermatology) [name], MD as Consulting Physician (Physical Medicine and Rehabilitation) [name], MD as Attending Physician (Gastroenterology) Mast, Man X, NP as Nurse Practitioner (Internal Medicine) Kathrynn Ducking, MD as Consulting Physician (Neurology) Debara Pickett Nadean Corwin, MD as Consulting Physician (Cardiology) Druscilla Brownie, MD as Consulting Physician (Dermatology)  Extended Emergency Contact Information Primary Emergency Contact: [name] of [place] Phone: [phone] Mobile Phone: [phone] Relation: Son  Code Status:  DNR Goals of care: Advanced Directive information Advanced Directives 02/25/2017  Does Patient Have a Medical Advance Directive? Yes  Type of Paramedic of [place];Living will  Does patient want to make changes to medical advance directive? No - Patient declined  Copy of [place] in Chart? Yes  Would patient like information on creating a medical advance directive? -     Chief Complaint  Patient presents with  . Acute Visit    fall     HPI:  Pt is a 84 y.o. female seen today at [facility] for an acute visit for evaluation of fall episode.she is seen in her room  today per facility Nurse report.Nurse reports patient was found on the floor bedside her bed with her walker in front of her.patient states was trying to go to the bathroom. No injuries was noted.she denies of any acute issues during visit.she states did not hit head or loose consciousness.Her vital signs are stable.    Past Medical History:  Diagnosis Date  . Abdominal bloating   . Atrial fibrillation ([place])   . Bruises easily   . Carotid artery occlusion    LEFT  . Dizziness   . DOE (dyspnea on exertion) 04/02/2014  . Fall at home Sept 2013, Dec. 2013  Jun 08, 2012  . GERD (gastroesophageal reflux disease) 02/11/2015  . Headache(784.0)   . Hoarseness   . Hypercholesterolemia   . Hyperglycemia 04/02/2014   Glucose 178 mg percent on 01/22/2014 04/05/14 Hgb A1c 6.6 Diet controlled.    . Hypothyroidism 04/15/2015  . Neuropathy    PERIPHERAL  . Pruritus   . Scoliosis   . Varicose veins    Past Surgical History:  Procedure Laterality Date  . ABDOMINAL HYSTERECTOMY  1954  . CHOLECYSTECTOMY  1997  . ESOPHAGOGASTRODUODENOSCOPY (EGD) WITH PROPOFOL N/A 11/25/2016   Procedure: ESOPHAGOGASTRODUODENOSCOPY (EGD) WITH PROPOFOL;  Surgeon: [name], MD;  Location: [facility];  Service: Endoscopy;  Laterality: N/A;  . [place]   correct scoliosis    No Known Allergies  Outpatient Encounter Medications as of 03/16/2017  Medication Sig  . acetaminophen (TYLENOL) 500 MG tablet Take 500 mg by mouth 2 (two) times daily.   Marland Kitchen antiseptic oral rinse (BIOTENE) LIQD 15 mLs by Mouth Rinse route as needed for dry mouth.  Marland Kitchen aspirin EC 81 MG tablet Take 81 mg daily by mouth.  Marland Kitchen atenolol (TENORMIN) 25 MG tablet Take 0.5  tablets (12.5 mg total) by mouth 2 (two) times daily.  . Biotin 5000 MCG TABS Take 5,000 mcg by mouth daily.  . DULoxetine (CYMBALTA) 20 MG capsule TAKE 1 CAPSULE AT BEDTIME.  . fluticasone (FLONASE) 50 MCG/ACT nasal spray Place 2 sprays into both nostrils daily.  Marland Kitchen lactose  free nutrition (BOOST) LIQD Take 237 mLs by mouth 3 (three) times daily between meals.  . multivitamin-iron-minerals-folic acid (CENTRUM) chewable tablet Chew 1 tablet daily by mouth.  . ondansetron (ZOFRAN) 4 MG tablet Take 1 tablet (4 mg total) by mouth every 6 (six) hours as needed for nausea.  . polyethylene glycol (MIRALAX / GLYCOLAX) packet Take 17 g daily as needed by mouth (for constipation).   . simvastatin (ZOCOR) 40 MG tablet Take 20 mg at bedtime by mouth.   . traMADol (ULTRAM) 50 MG tablet Take 50 mg by mouth every 8 (eight) hours as needed.  . furosemide (LASIX) 40 MG tablet Take 40 mg by mouth daily.  . [DISCONTINUED] traMADol (ULTRAM) 50 MG tablet Take 50 mg by mouth 2 (two) times daily. Stop date 02/25/17   No facility-administered encounter medications on file as of 03/16/2017.     Review of Systems  Constitutional: Negative for appetite change, chills, fatigue and fever.  HENT: Negative for congestion, rhinorrhea, sinus pressure, sinus pain, sneezing and sore throat.   Eyes: Positive for visual disturbance. Negative for discharge, redness and itching.       Wears eye glasses   Respiratory: Negative for cough, chest tightness, shortness of breath and wheezing.   Cardiovascular: Negative for chest pain, palpitations and leg swelling.  Gastrointestinal: Negative for abdominal distention, abdominal pain, constipation, diarrhea, nausea and vomiting.  Genitourinary: Negative for dysuria, flank pain, frequency and urgency.  Musculoskeletal: Positive for arthralgias and gait problem.  Skin: Negative for color change, pallor and rash.  Neurological: Negative for dizziness, syncope, light-headedness and headaches.  Psychiatric/Behavioral: Negative for agitation, confusion and sleep disturbance. The patient is not nervous/anxious.     Immunization History  Administered Date(s) Administered  . Influenza Split 10/20/2011, 11/03/2012  . Influenza,inj,Quad PF,6+ Mos 09/29/2012,  11/09/2013  . Influenza-Unspecified 10/17/2014, 10/23/2015, 10/20/2016  . PPD Test 08/21/2013, 03/04/2014  . Pneumococcal Conjugate-13 08/21/2013  . Tdap 05/20/2008  . Zoster 11/01/2012   Pertinent  Health Maintenance Due  Topic Date Due  . DEXA SCAN  01/31/1987  . PNA vac Low Risk Adult (2 of 2 - PPSV23) 08/22/2014  . INFLUENZA VACCINE  Completed   Fall Risk  09/22/2016 08/25/2016 05/31/2016 09/02/2015 04/29/2015  Falls in the past year? Yes Yes No No No  Number falls in past yr: 1 2 or more - - -  Comment - - - - -  Injury with Fall? Yes Yes - - -  Comment fx 3 ribs - - - -  Risk Factor Category  - - - - -  Risk for fall due to : Impaired mobility;Impaired balance/gait - - - -  Risk for fall due to: Comment - - - - -    Vitals:   03/16/17 1030  BP: 136/62  Pulse: 80  Resp: 20  Temp: (!) 96.6 F (35.9 C)  SpO2: 96%  Weight: 95 lb 12.8 oz (43.5 kg)  Height: 5\' 2"  (1.575 m)   Body mass index is 17.52 kg/m. Physical Exam  Constitutional: She is oriented to person, place, and time.  Thin built frail elderly in no acute distress   HENT:  Head: Normocephalic.  Mouth/Throat: Oropharynx is  clear and moist. No oropharyngeal exudate.  Eyes: Conjunctivae and EOM are normal. Pupils are equal, round, and reactive to light. Right eye exhibits no discharge. Left eye exhibits no discharge. No scleral icterus.  Wears eye glasses   Neck: Normal range of motion. No JVD present. No thyromegaly present.  Cardiovascular: Intact distal pulses. Exam reveals no gallop and no friction rub.  Murmur heard. Pulmonary/Chest: Effort normal and breath sounds normal. No respiratory distress. She has no wheezes. She has no rales.  Abdominal: Soft. Bowel sounds are normal. She exhibits no distension. There is no tenderness. There is no rebound and no guarding.  Musculoskeletal: She exhibits no edema or tenderness.  Unsteady gait uses FWW.  Lymphadenopathy:    She has no cervical adenopathy.    Neurological: She is oriented to person, place, and time. Coordination normal.  Skin: Skin is warm and dry. No rash noted. No erythema. No pallor.  Psychiatric: She has a normal mood and affect.    Labs reviewed: Recent Labs    12/02/16 0511 12/04/16 0220 12/07/16 0750 12/16/16 03/07/17  NA 138 135 135 137 140  K 4.2 4.2 3.9 4.8 4.9  CL 103 104 102  --   --   CO2 28 25 26   --   --   GLUCOSE 110* 89 100*  --   --   BUN 15 10 13 17 14   CREATININE 0.72 0.65 0.72 0.8 0.77  CALCIUM 8.7* 8.5* 8.9  --  9.1  MG 1.9  --  2.1  --   --    Recent Labs    07/29/16 0740 12/04/16 0220 03/07/17  AST 19 24 22   ALT 9 13* 13  ALKPHOS 80 75 71  BILITOT 0.6 0.6 0.6  PROT 5.7* 5.5* 5.7  ALBUMIN 3.2* 2.7* 3.3   Recent Labs    12/01/16 1904 12/02/16 0511 12/04/16 0220 12/16/16 02/18/17 03/07/17  WBC 8.4 11.1* 9.9 6.9 7.8  7.8 6.9  NEUTROABS 6.3 8.0*  --   --   --   --   HGB 13.8 11.5* 11.6* 11.8* 13.5  13.5 11.9  HCT 42.0 35.1* 34.6* 34* 39  38.9 34  MCV 98.4 98.3 98.0  --   --   --   PLT 221 199 213 276 256  --    Lab Results  Component Value Date   TSH 1.272 12/02/2016   Lab Results  Component Value Date   HGBA1C 6.4 07/07/2015   Lab Results  Component Value Date   CHOL 151 02/10/2017   HDL 76 (A) 02/10/2017   LDLCALC 54 02/10/2017   TRIG 125 02/10/2017   CHOLHDL 2.1 07/29/2016    Significant Diagnostic Results in last 30 days:  No results found.  Assessment/Plan 1. Unsteady gait Uses FWW but remains high risk for falls due to advance age and frailty.B/p reviewed stable.will have her work with PT/OT for exercise,gait stability and muscle strengthening.continue on fall and safety precautions.    2. Fall at nursing home, initial encounter Observed on the floor during the night shift after she called out for assistance.States was trying to go to the bathroom.No acute injuries.exam findings negative.continue on Fall and safety precautions. PT/OT as above.   Family/  staff Communication: Reviewed plan of care with patient and facility Nurse.   Labs/tests ordered: None   Jairon Ripberger C Amayra Kiedrowski, NP

## 2017-03-17 DIAGNOSIS — R2681 Unsteadiness on feet: Secondary | ICD-10-CM | POA: Diagnosis not present

## 2017-03-17 DIAGNOSIS — R29898 Other symptoms and signs involving the musculoskeletal system: Secondary | ICD-10-CM | POA: Diagnosis not present

## 2017-03-17 DIAGNOSIS — Z9181 History of falling: Secondary | ICD-10-CM | POA: Diagnosis not present

## 2017-03-17 DIAGNOSIS — M6281 Muscle weakness (generalized): Secondary | ICD-10-CM | POA: Diagnosis not present

## 2017-03-18 ENCOUNTER — Encounter: Payer: Self-pay | Admitting: Internal Medicine

## 2017-03-18 ENCOUNTER — Non-Acute Institutional Stay: Payer: Medicare Other | Admitting: Internal Medicine

## 2017-03-18 DIAGNOSIS — R2242 Localized swelling, mass and lump, left lower limb: Secondary | ICD-10-CM

## 2017-03-18 DIAGNOSIS — M6281 Muscle weakness (generalized): Secondary | ICD-10-CM | POA: Diagnosis not present

## 2017-03-18 DIAGNOSIS — R2681 Unsteadiness on feet: Secondary | ICD-10-CM | POA: Diagnosis not present

## 2017-03-18 DIAGNOSIS — M79605 Pain in left leg: Secondary | ICD-10-CM

## 2017-03-18 DIAGNOSIS — Z9181 History of falling: Secondary | ICD-10-CM | POA: Diagnosis not present

## 2017-03-18 DIAGNOSIS — R29898 Other symptoms and signs involving the musculoskeletal system: Secondary | ICD-10-CM | POA: Diagnosis not present

## 2017-03-18 NOTE — Progress Notes (Signed)
Location:  [place] Room Number: 1 Place of Service:  [phone]) Provider:  Blanchie Serve, MD  Blanchie Serve, MD  Patient Care Team: Blanchie Serve, MD as PCP - General (Internal Medicine) Angelia Mould, MD as Attending Physician (Vascular Surgery) [name], MD as Attending Physician (Obstetrics and Gynecology) [name], MD (Orthopedic Surgery) [name], MD (Ophthalmology) [name], MD as Consulting Physician (Urology) [name], MD as Consulting Physician (Cardiology) [name], PA-C as Physician Assistant (Otolaryngology) [name], MD as Consulting Physician (Otolaryngology) [name], MD as Consulting Physician (Dermatology) [name], MD as Consulting Physician (Physical Medicine and Rehabilitation) [name], MD as Attending Physician (Gastroenterology) Mast, Man X, NP as Nurse Practitioner (Internal Medicine) Kathrynn Ducking, MD as Consulting Physician (Neurology) Debara Pickett Nadean Corwin, MD as Consulting Physician (Cardiology) Druscilla Brownie, MD as Consulting Physician (Dermatology)  Extended Emergency Contact Information Primary Emergency Contact: [name] of [place] Phone: [phone] Mobile Phone: [phone] Relation: Son   Goals of care: Advanced Directive information Advanced Directives 03/18/2017  Does Patient Have a Medical Advance Directive? Yes  Type of Paramedic of [place];Living will  Does patient want to make changes to medical advance directive? No - Patient declined  Copy of [place] in Chart? Yes  Would patient like information on creating a medical advance directive? -     Chief Complaint  Patient presents with  . Acute Visit    Patient concerns    HPI:  Pt is a 84 y.o. female seen today for an acute visit for ongoing left leg pain and her wanting to drive. She has a house close by and would  like to be cleared for driving so she could go to her house when desired and look after it. She continues to have pain to her left leg and ankle. She has swelling around her left ankle. Workup so far has ruled out fracture, blood clot, infectious etiology. She has been treated twice with antibiotic course for presumed cellulitis and prednisone course once for presumed gout. Uric acid level, wbc normal. ESR on higher side of normal. Pain does not interfere with her mobility. Pain is present only with touch. Denies numbness or tingling. No fever reported.    Past Medical History:  Diagnosis Date  . Abdominal bloating   . Atrial fibrillation ([place])   . Bruises easily   . Carotid artery occlusion    LEFT  . Dizziness   . DOE (dyspnea on exertion) 04/02/2014  . Fall at home Sept 2013, Dec. 2013  Jun 08, 2012  . GERD (gastroesophageal reflux disease) 02/11/2015  . Headache(784.0)   . Hoarseness   . Hypercholesterolemia   . Hyperglycemia 04/02/2014   Glucose 178 mg percent on 01/22/2014 04/05/14 Hgb A1c 6.6 Diet controlled.    . Hypothyroidism 04/15/2015  . Neuropathy    PERIPHERAL  . Pruritus   . Scoliosis   . Varicose veins    Past Surgical History:  Procedure Laterality Date  . ABDOMINAL HYSTERECTOMY  1954  . CHOLECYSTECTOMY  1997  . ESOPHAGOGASTRODUODENOSCOPY (EGD) WITH PROPOFOL N/A 11/25/2016   Procedure: ESOPHAGOGASTRODUODENOSCOPY (EGD) WITH PROPOFOL;  Surgeon: [name], MD;  Location: [facility];  Service: Endoscopy;  Laterality: N/A;  . SPINE SURGERY  1997   correct scoliosis    No Known Allergies  Outpatient Encounter Medications as of 03/18/2017  Medication Sig  . acetaminophen (TYLENOL) 500 MG tablet Take 500 mg by  mouth 2 (two) times daily.   Marland Kitchen antiseptic oral rinse (BIOTENE) LIQD 15 mLs by Mouth Rinse route as needed for dry mouth.  Marland Kitchen aspirin EC 81 MG tablet Take 81 mg daily by mouth.  Marland Kitchen atenolol (TENORMIN) 25 MG tablet Take 0.5 tablets (12.5 mg total) by mouth 2  (two) times daily.  . Biotin 5000 MCG TABS Take 5,000 mcg by mouth daily.  . Docosanol (ABREVA) 10 % CREA Apply 1 application topically 5 (five) times daily.  . DULoxetine (CYMBALTA) 20 MG capsule TAKE 1 CAPSULE AT BEDTIME.  . fluticasone (FLONASE) 50 MCG/ACT nasal spray Place 2 sprays into both nostrils daily.  Marland Kitchen lactose free nutrition (BOOST) LIQD Take 237 mLs by mouth 3 (three) times daily between meals.  . multivitamin-iron-minerals-folic acid (CENTRUM) chewable tablet Chew 1 tablet daily by mouth.  . ondansetron (ZOFRAN) 4 MG tablet Take 1 tablet (4 mg total) by mouth every 6 (six) hours as needed for nausea.  . polyethylene glycol (MIRALAX / GLYCOLAX) packet Take 17 g daily as needed by mouth (for constipation).   . simvastatin (ZOCOR) 40 MG tablet Take 20 mg at bedtime by mouth.   . traMADol (ULTRAM) 50 MG tablet Take 50 mg by mouth every 8 (eight) hours as needed.  . [DISCONTINUED] furosemide (LASIX) 40 MG tablet Take 40 mg by mouth daily.   No facility-administered encounter medications on file as of 03/18/2017.     Review of Systems  Constitutional: Negative for appetite change, fever and unexpected weight change.  Respiratory: Negative for shortness of breath.   Cardiovascular: Negative for chest pain.  Musculoskeletal: Positive for gait problem.  Skin: Negative for wound.  Neurological: Negative for numbness and headaches.  Hematological: Negative for adenopathy.  Psychiatric/Behavioral: Negative for behavioral problems and confusion.    Immunization History  Administered Date(s) Administered  . Influenza Split 10/20/2011, 11/03/2012  . Influenza,inj,Quad PF,6+ Mos 09/29/2012, 11/09/2013  . Influenza-Unspecified 10/17/2014, 10/23/2015, 10/20/2016  . PPD Test 08/21/2013, 03/04/2014  . Pneumococcal Conjugate-13 08/21/2013  . Tdap 05/20/2008  . Zoster 11/01/2012   Pertinent  Health Maintenance Due  Topic Date Due  . DEXA SCAN  01/31/1987  . PNA vac Low Risk Adult (2 of  2 - PPSV23) 08/22/2014  . INFLUENZA VACCINE  Completed   Fall Risk  09/22/2016 08/25/2016 05/31/2016 09/02/2015 04/29/2015  Falls in the past year? Yes Yes No No No  Number falls in past yr: 1 2 or more - - -  Comment - - - - -  Injury with Fall? Yes Yes - - -  Comment fx 3 ribs - - - -  Risk Factor Category  - - - - -  Risk for fall due to : Impaired mobility;Impaired balance/gait - - - -  Risk for fall due to: Comment - - - - -   Functional Status Survey:    Vitals:   03/18/17 1052  BP: (!) 123/50  Pulse: 86  Resp: 20  Temp: 98 F (36.7 C)  TempSrc: Oral  SpO2: 91%  Weight: 95 lb 12.8 oz (43.5 kg)  Height: '5\' 2"'  (1.575 m)   Body mass index is 17.52 kg/m.   Wt Readings from Last 3 Encounters:  03/18/17 95 lb 12.8 oz (43.5 kg)  03/16/17 95 lb 12.8 oz (43.5 kg)  02/25/17 93 lb 6.4 oz (42.4 kg)   Physical Exam  Constitutional:  Thin built, frail, elderly female  HENT:  Head: Normocephalic and atraumatic.  Eyes: Pupils are equal, round, and reactive  to light. Left eye exhibits no discharge.  Neck: Neck supple.  Cardiovascular: Normal rate, regular rhythm and intact distal pulses.  Pulmonary/Chest: Effort normal and breath sounds normal.  Abdominal: Soft. Bowel sounds are normal.  Musculoskeletal: She exhibits tenderness.  Left ankle edema +, left leg trace edema, palpable mass to left anteromedial aspect extending to left posterolateral area- firm, tender to touch, solid, no drainage, normal temperature. Unsteady gait. Uses walker. Slightly indurated and erythematous.  Neurological: She is alert.  Skin: Skin is warm and dry. She is not diaphoretic.  Psychiatric: She has a normal mood and affect.    Labs reviewed: Recent Labs    12/02/16 0511 12/04/16 0220 12/07/16 0750 12/16/16 03/07/17  NA 138 135 135 137 140  140  K 4.2 4.2 3.9 4.8 4.9  4.9  CL 103 104 102  --   --   CO2 '28 25 26  ' --   --   GLUCOSE 110* 89 100*  --   --   BUN '15 10 13 17 14  ' CREATININE  0.72 0.65 0.72 0.8 0.8  0.77  CALCIUM 8.7* 8.5* 8.9  --  9.1  MG 1.9  --  2.1  --   --    Recent Labs    07/29/16 0740 12/04/16 0220 03/07/17  AST '19 24 22  22  ' ALT 9 13* 13  13  ALKPHOS 80 75 71  71  BILITOT 0.6 0.6 0.6  PROT 5.7* 5.5* 5.7  ALBUMIN 3.2* 2.7* 3.3   Recent Labs    12/01/16 1904 12/02/16 0511 12/04/16 0220 12/16/16 02/18/17 03/07/17  WBC 8.4 11.1* 9.9 6.9 7.8  7.8 6.9  NEUTROABS 6.3 8.0*  --   --   --   --   HGB 13.8 11.5* 11.6* 11.8* 13.5  13.5 11.9  HCT 42.0 35.1* 34.6* 34* 39  38.9 34  MCV 98.4 98.3 98.0  --   --   --   PLT 221 199 213 276 256  --    Lab Results  Component Value Date   TSH 1.272 12/02/2016   Lab Results  Component Value Date   HGBA1C 6.4 07/07/2015   Lab Results  Component Value Date   CHOL 151 02/10/2017   HDL 76 (A) 02/10/2017   LDLCALC 54 02/10/2017   TRIG 125 02/10/2017   CHOLHDL 2.1 07/29/2016    Significant Diagnostic Results in last 30 days:  No results found.  Assessment/Plan  Left leg pain Continue tylenol 500 mg tid, tramadol 50 mg q8h prn pain. Unclear of etiology for pain. Obtain ABI to rule out claudication pain. Check vitamin D level. Obtain CT of left leg without contrast to evaluate lump/ mass better and then provide required referral.   Left leg mass Tender, firm, no clinical signs of infection. Obtain CT leg to assess further.    Family/ staff Communication: reviewed care plan with patient and charge nurse.    Labs/tests ordered:  Vitamin d, CT left leg, ABI  Blanchie Serve, MD Internal Medicine [facility] [address] [place], [place] [address] Cell Phone (Monday-Friday 8 am - 5 pm): [phone] On Call: [phone] and follow prompts after 5 pm and on weekends Office Phone: [phone] Office Fax: [phone]

## 2017-03-21 DIAGNOSIS — E559 Vitamin D deficiency, unspecified: Secondary | ICD-10-CM | POA: Diagnosis not present

## 2017-03-21 DIAGNOSIS — R29898 Other symptoms and signs involving the musculoskeletal system: Secondary | ICD-10-CM | POA: Diagnosis not present

## 2017-03-21 DIAGNOSIS — M6281 Muscle weakness (generalized): Secondary | ICD-10-CM | POA: Diagnosis not present

## 2017-03-21 DIAGNOSIS — Z9181 History of falling: Secondary | ICD-10-CM | POA: Diagnosis not present

## 2017-03-21 DIAGNOSIS — I5022 Chronic systolic (congestive) heart failure: Secondary | ICD-10-CM | POA: Diagnosis not present

## 2017-03-21 DIAGNOSIS — R2681 Unsteadiness on feet: Secondary | ICD-10-CM | POA: Diagnosis not present

## 2017-03-21 DIAGNOSIS — I482 Chronic atrial fibrillation: Secondary | ICD-10-CM | POA: Diagnosis not present

## 2017-03-22 DIAGNOSIS — M6281 Muscle weakness (generalized): Secondary | ICD-10-CM | POA: Diagnosis not present

## 2017-03-22 DIAGNOSIS — R29898 Other symptoms and signs involving the musculoskeletal system: Secondary | ICD-10-CM | POA: Diagnosis not present

## 2017-03-22 DIAGNOSIS — R2681 Unsteadiness on feet: Secondary | ICD-10-CM | POA: Diagnosis not present

## 2017-03-22 DIAGNOSIS — Z9181 History of falling: Secondary | ICD-10-CM | POA: Diagnosis not present

## 2017-03-23 ENCOUNTER — Ambulatory Visit (INDEPENDENT_AMBULATORY_CARE_PROVIDER_SITE_OTHER): Payer: Medicare Other | Admitting: Adult Health

## 2017-03-23 ENCOUNTER — Encounter (INDEPENDENT_AMBULATORY_CARE_PROVIDER_SITE_OTHER): Payer: Self-pay

## 2017-03-23 ENCOUNTER — Encounter: Payer: Self-pay | Admitting: Adult Health

## 2017-03-23 VITALS — BP 112/59 | HR 67 | Ht 60.0 in | Wt 95.2 lb

## 2017-03-23 DIAGNOSIS — M79605 Pain in left leg: Secondary | ICD-10-CM

## 2017-03-23 DIAGNOSIS — G629 Polyneuropathy, unspecified: Secondary | ICD-10-CM

## 2017-03-23 DIAGNOSIS — R29898 Other symptoms and signs involving the musculoskeletal system: Secondary | ICD-10-CM | POA: Diagnosis not present

## 2017-03-23 DIAGNOSIS — R42 Dizziness and giddiness: Secondary | ICD-10-CM

## 2017-03-23 DIAGNOSIS — R2681 Unsteadiness on feet: Secondary | ICD-10-CM | POA: Diagnosis not present

## 2017-03-23 DIAGNOSIS — M6281 Muscle weakness (generalized): Secondary | ICD-10-CM | POA: Diagnosis not present

## 2017-03-23 DIAGNOSIS — Z9181 History of falling: Secondary | ICD-10-CM | POA: Diagnosis not present

## 2017-03-23 NOTE — Patient Instructions (Signed)
Your Plan:  Continue Cymbalta 20 mg  If your symptoms worsen or you develop new symptoms please let us know.   Thank you for coming to see [place] at [facility]. I hope we have been able to provide you high quality care today.  You may receive a patient satisfaction survey over the next few weeks. We would appreciate your feedback and comments so that we may continue to improve ourselves and the health of our patients.

## 2017-03-23 NOTE — Progress Notes (Signed)
I have read the note, and I agree with the clinical assessment and plan.  Calypso Hagarty K Juanetta Negash   

## 2017-03-23 NOTE — Progress Notes (Signed)
PATIENT: Vanessa [name] DOB: 04-26-1920  REASON FOR VISIT: follow up HISTORY FROM: patient  HISTORY OF PRESENT ILLNESS: Today 03/23/17 Vanessa [name]  is a 84 year old female with a history of vertigo she states that she has not had any episodes of vertigo.  She reports that her main concern is left leg pain.  She states that her primary care is aware and has been doing a workup.  The patient states that she has had redness around the ankle and swelling.  This is been ongoing since January.  She has had a workup for cellulitis, blood clot, fracture and gout.  She has see HPI also had x-rays of the ankle.  She has also completed 2 rounds of antibiotics with no relief.  Her primary care just referred her to dermatology for an evaluation as well.  The patient reports that she continues to have some numbness in the feet due to neuropathy.  She denies any significant discomfort other than the pain associated with the left leg.  She reports that she has not noticed any significant changes in her gait or balance.  She does use a walker when ambulating.  She returns today for evaluation.   HISTORY 09/22/16 Vanessa [name] is a 84 year old female with a history of positional vertigo and peripheral neuropathy. She returns today for follow-up. At the last visit she was started on Cymbalta. She states that she is taking 20 mg at bedtime. She has numbness beneficial for the burning tingling pain that she was experiencing in the feet and up the legs. She does not feel that her walking or balance has change significantly. She uses a cane when ambulating. She has been in physical therapy at friend's home. She denies any recent falls. She reports that she is no longer having the sharp lancing pains in the feet and legs. She returns today for an evaluation.  REVIEW OF SYSTEMS: Out of a complete 14 system review of symptoms, the patient complains only of the following symptoms, and all other reviewed systems are  negative.  See HPI  ALLERGIES: No Known Allergies  HOME MEDICATIONS: Outpatient Medications Prior to Visit  Medication Sig Dispense Refill  . acetaminophen (TYLENOL) 500 MG tablet Take 500 mg by mouth 2 (two) times daily.     Marland Kitchen antiseptic oral rinse (BIOTENE) LIQD 15 mLs by Mouth Rinse route as needed for dry mouth.    Marland Kitchen aspirin EC 81 MG tablet Take 81 mg daily by mouth.    Marland Kitchen atenolol (TENORMIN) 25 MG tablet Take 0.5 tablets (12.5 mg total) by mouth 2 (two) times daily. 60 tablet 0  . Biotin 5000 MCG TABS Take 5,000 mcg by mouth daily.    . Docosanol (ABREVA) 10 % CREA Apply 1 application topically 5 (five) times daily.    . DULoxetine (CYMBALTA) 20 MG capsule TAKE 1 CAPSULE AT BEDTIME. 30 capsule 11  . fluticasone (FLONASE) 50 MCG/ACT nasal spray Place 2 sprays into both nostrils daily.    Marland Kitchen lactose free nutrition (BOOST) LIQD Take 237 mLs by mouth 3 (three) times daily between meals.    . multivitamin-iron-minerals-folic acid (CENTRUM) chewable tablet Chew 1 tablet daily by mouth.    . ondansetron (ZOFRAN) 4 MG tablet Take 1 tablet (4 mg total) by mouth every 6 (six) hours as needed for nausea. 20 tablet 0  . polyethylene glycol (MIRALAX / GLYCOLAX) packet Take 17 g daily as needed by mouth (for constipation).     . simvastatin (ZOCOR) 40  MG tablet Take 20 mg at bedtime by mouth.     . traMADol (ULTRAM) 50 MG tablet Take 50 mg by mouth every 8 (eight) hours as needed.     No facility-administered medications prior to visit.     PAST MEDICAL HISTORY: Past Medical History:  Diagnosis Date  . Abdominal bloating   . Atrial fibrillation ([place])   . Bruises easily   . Carotid artery occlusion    LEFT  . Dizziness   . DOE (dyspnea on exertion) 04/02/2014  . Fall at home Sept 2013, Dec. 2013  Jun 08, 2012  . GERD (gastroesophageal reflux disease) 02/11/2015  . Headache(784.0)   . Hoarseness   . Hypercholesterolemia   . Hyperglycemia 04/02/2014   Glucose 178 mg percent on 01/22/2014  04/05/14 Hgb A1c 6.6 Diet controlled.    . Hypothyroidism 04/15/2015  . Neuropathy    PERIPHERAL  . Pruritus   . Scoliosis   . Varicose veins     PAST SURGICAL HISTORY: Past Surgical History:  Procedure Laterality Date  . ABDOMINAL HYSTERECTOMY  1954  . CHOLECYSTECTOMY  1997  . ESOPHAGOGASTRODUODENOSCOPY (EGD) WITH PROPOFOL N/A 11/25/2016   Procedure: ESOPHAGOGASTRODUODENOSCOPY (EGD) WITH PROPOFOL;  Surgeon: [name], MD;  Location: [facility];  Service: Endoscopy;  Laterality: N/A;  . SPINE SURGERY  1997   correct scoliosis    FAMILY HISTORY: Family History  Adopted: Yes  Problem Relation Age of Onset  . Diabetes Mother   . Heart attack Mother   . Heart disease Mother        After age 30  . Heart attack Father   . Stroke Father   . Breast cancer Sister   . Heart disease Maternal Grandmother   . Cancer Son        adopted son. esophagus, stomach, liver  . Stomach cancer Neg Hx   . Colon cancer Neg Hx     SOCIAL HISTORY: Social History   Socioeconomic History  . Marital status: Widowed    Spouse name: Not on file  . Number of children: 2  . Years of education: 12+  . Highest education level: Not on file  Social Needs  . Financial resource strain: Not on file  . Food insecurity - worry: Not on file  . Food insecurity - inability: Not on file  . Transportation needs - medical: Not on file  . Transportation needs - non-medical: Not on file  Occupational History  . 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 Smoking status: Never Smoker  . Smokeless tobacco: Never Used  Substance and Sexual Activity  . Alcohol use: No    Alcohol/week: 0.0 oz  . Drug use: No  . Sexual activity: No    Birth control/protection: None  Other Topics Concern  . Not on file  Social History Narrative   Patient lives at [facility] 03/05/2014   Patient is a widowed.    Patient is retired.    Patient has no children  but adopted twin sons.    Patient has a college degree.    Never smoked   Alcohol none   Exercise with therapy   Walks with cane   POA       Patient only drinks de-caf drinks.   Patient is right handed.      PHYSICAL EXAM  Vitals:   03/23/17 1443  BP: (!) 112/59  Pulse: 67  Weight: 95 lb 3.2 oz (43.2 kg)  Height: 5' (1.524 m)   Body mass index is 18.59 kg/m.  Generalized: Well developed, in no acute distress  1.  Erythema noted on the medial aspect of the left lower extremity.  Swelling noted in ankle. Pain with palpation.  Neurological examination  Mentation: Alert oriented to time, place, history taking. Follows all commands speech and language fluent Cranial nerve II-XII: Pupils were equal round reactive to light. Extraocular movements were full, visual field were full on confrontational test. Facial sensation and strength were normal. Uvula tongue midline. Head turning and shoulder shrug  were normal and symmetric. Motor: The motor testing reveals 5 over 5 strength of all 4 extremities. Good symmetric motor tone is noted throughout.  Sensory: Sensory testing is intact to soft touch on all 4 extremities. No evidence of extinction is noted.  Coordination: Cerebellar testing reveals good finger-nose-finger and heel-to-shin bilaterally.  Gait and station: Patient uses a walker when ambulating.  Tandem gait not attempted. Reflexes: Deep tendon reflexes are symmetric and normal bilaterally.   DIAGNOSTIC DATA (LABS, IMAGING, TESTING) - I reviewed patient records, labs, notes, testing and imaging myself where available.  Lab Results  Component Value Date   WBC 6.9 03/07/2017   HGB 11.9 03/07/2017   HCT 34 03/07/2017   MCV 98.0 12/04/2016   PLT 256 02/18/2017      Component Value Date/Time   NA 140 03/07/2017   NA 140 03/07/2017   K 4.9 03/07/2017   K 4.9 03/07/2017   CL 102 12/07/2016 0750   CO2 26 12/07/2016 0750   GLUCOSE 100 (H) 12/07/2016 0750   BUN 14  03/07/2017   CREATININE 0.77 03/07/2017   CREATININE 0.8 03/07/2017   CALCIUM 9.1 03/07/2017   PROT 5.7 03/07/2017   ALBUMIN 3.3 03/07/2017   AST 22 03/07/2017   AST 22 03/07/2017   ALT 13 03/07/2017   ALT 13 03/07/2017   ALKPHOS 71 03/07/2017   ALKPHOS 71 03/07/2017   BILITOT 0.6 03/07/2017   GFRNONAA 66 03/07/2017   GFRAA >60 12/07/2016 0750   Lab Results  Component Value Date   CHOL 151 02/10/2017   HDL 76 (A) 02/10/2017   LDLCALC 54 02/10/2017   TRIG 125 02/10/2017   CHOLHDL 2.1 07/29/2016   Lab Results  Component Value Date   HGBA1C 6.4 07/07/2015   Lab Results  Component Value Date   [id number] 253 05/12/2016   Lab Results  Component Value Date   TSH 1.272 12/02/2016      ASSESSMENT AND PLAN 84 y.o. year old female  has a past medical history of Abdominal bloating, Atrial fibrillation ([place]), Bruises easily, Carotid artery occlusion, Dizziness, DOE (dyspnea on exertion) (04/02/2014), Fall at home (Sept 2013, Dec. 2013  Jun 08, 2012), GERD (gastroesophageal reflux disease) (02/11/2015), Headache(784.0), Hoarseness, Hypercholesterolemia, Hyperglycemia (04/02/2014), Hypothyroidism (04/15/2015), Neuropathy, Pruritus, Scoliosis, and Varicose veins. here with:  1. Neuropathy 2.  Vertigo 3.  Pain  left leg  The patient will continue on Cymbalta 20 mg daily.  She has not had any episodes of vertigo.  Her neuropathy has not worsened.  She is having pain in the left lower extremity.  Her primary care's workup has been relatively unremarkable.  She has an appointment with dermatology.  She is advised that if her symptoms worsen or she develops new symptoms she should let us know.  She will follow-up in 6 months or sooner if needed.  I spent 15 minutes with the patient. 50% of this time was spent   [facility]  Clabe Seal, MSN, NP-C 03/23/2017, 2:55 PM [facility] [address] [place], [place] [address] [phone]

## 2017-03-24 ENCOUNTER — Other Ambulatory Visit: Payer: Self-pay | Admitting: Internal Medicine

## 2017-03-24 DIAGNOSIS — Z9181 History of falling: Secondary | ICD-10-CM | POA: Diagnosis not present

## 2017-03-24 DIAGNOSIS — R2681 Unsteadiness on feet: Secondary | ICD-10-CM | POA: Diagnosis not present

## 2017-03-24 DIAGNOSIS — R29898 Other symptoms and signs involving the musculoskeletal system: Secondary | ICD-10-CM | POA: Diagnosis not present

## 2017-03-24 DIAGNOSIS — R52 Pain, unspecified: Secondary | ICD-10-CM

## 2017-03-24 DIAGNOSIS — M6281 Muscle weakness (generalized): Secondary | ICD-10-CM | POA: Diagnosis not present

## 2017-03-24 DIAGNOSIS — R609 Edema, unspecified: Secondary | ICD-10-CM

## 2017-03-25 DIAGNOSIS — M6281 Muscle weakness (generalized): Secondary | ICD-10-CM | POA: Diagnosis not present

## 2017-03-25 DIAGNOSIS — R2681 Unsteadiness on feet: Secondary | ICD-10-CM | POA: Diagnosis not present

## 2017-03-25 DIAGNOSIS — R29898 Other symptoms and signs involving the musculoskeletal system: Secondary | ICD-10-CM | POA: Diagnosis not present

## 2017-03-25 DIAGNOSIS — Z9181 History of falling: Secondary | ICD-10-CM | POA: Diagnosis not present

## 2017-03-30 DIAGNOSIS — Z9181 History of falling: Secondary | ICD-10-CM | POA: Diagnosis not present

## 2017-03-30 DIAGNOSIS — M6281 Muscle weakness (generalized): Secondary | ICD-10-CM | POA: Diagnosis not present

## 2017-03-30 DIAGNOSIS — R2681 Unsteadiness on feet: Secondary | ICD-10-CM | POA: Diagnosis not present

## 2017-03-30 DIAGNOSIS — R29898 Other symptoms and signs involving the musculoskeletal system: Secondary | ICD-10-CM | POA: Diagnosis not present

## 2017-03-31 DIAGNOSIS — Z9181 History of falling: Secondary | ICD-10-CM | POA: Diagnosis not present

## 2017-03-31 DIAGNOSIS — R29898 Other symptoms and signs involving the musculoskeletal system: Secondary | ICD-10-CM | POA: Diagnosis not present

## 2017-03-31 DIAGNOSIS — R2681 Unsteadiness on feet: Secondary | ICD-10-CM | POA: Diagnosis not present

## 2017-03-31 DIAGNOSIS — M6281 Muscle weakness (generalized): Secondary | ICD-10-CM | POA: Diagnosis not present

## 2017-04-01 ENCOUNTER — Ambulatory Visit
Admission: RE | Admit: 2017-04-01 | Discharge: 2017-04-01 | Disposition: A | Discharge status: Home / Self Care | Payer: Medicare Other | Source: Ambulatory Visit | Attending: Internal Medicine | Admitting: Internal Medicine

## 2017-04-01 ENCOUNTER — Encounter: Payer: Self-pay | Admitting: Internal Medicine

## 2017-04-01 ENCOUNTER — Other Ambulatory Visit: Payer: Self-pay | Admitting: Internal Medicine

## 2017-04-01 ENCOUNTER — Telehealth: Payer: Self-pay

## 2017-04-01 DIAGNOSIS — R52 Pain, unspecified: Secondary | ICD-10-CM

## 2017-04-01 DIAGNOSIS — M6281 Muscle weakness (generalized): Secondary | ICD-10-CM | POA: Diagnosis not present

## 2017-04-01 DIAGNOSIS — M7989 Other specified soft tissue disorders: Secondary | ICD-10-CM | POA: Diagnosis not present

## 2017-04-01 DIAGNOSIS — S8012XA Contusion of left lower leg, initial encounter: Secondary | ICD-10-CM | POA: Diagnosis not present

## 2017-04-01 DIAGNOSIS — R609 Edema, unspecified: Secondary | ICD-10-CM

## 2017-04-01 DIAGNOSIS — R29898 Other symptoms and signs involving the musculoskeletal system: Secondary | ICD-10-CM | POA: Diagnosis not present

## 2017-04-01 DIAGNOSIS — R2242 Localized swelling, mass and lump, left lower limb: Secondary | ICD-10-CM | POA: Insufficient documentation

## 2017-04-01 DIAGNOSIS — Z9181 History of falling: Secondary | ICD-10-CM | POA: Diagnosis not present

## 2017-04-01 DIAGNOSIS — R2681 Unsteadiness on feet: Secondary | ICD-10-CM | POA: Diagnosis not present

## 2017-04-01 NOTE — Telephone Encounter (Signed)
A fax with ABI results was received at the office today. These results were placed in Dr. Jackolyn Confer office mailbox.   Please advise if these results need to be faxed to [facility]?

## 2017-04-01 NOTE — Telephone Encounter (Signed)
Please fax a copy to [facility] for their records. I have reviewed the result in Epic. Thank you!

## 2017-04-06 DIAGNOSIS — Z9181 History of falling: Secondary | ICD-10-CM | POA: Diagnosis not present

## 2017-04-06 DIAGNOSIS — M6281 Muscle weakness (generalized): Secondary | ICD-10-CM | POA: Diagnosis not present

## 2017-04-06 DIAGNOSIS — R2681 Unsteadiness on feet: Secondary | ICD-10-CM | POA: Diagnosis not present

## 2017-04-06 DIAGNOSIS — R29898 Other symptoms and signs involving the musculoskeletal system: Secondary | ICD-10-CM | POA: Diagnosis not present

## 2017-04-07 DIAGNOSIS — R29898 Other symptoms and signs involving the musculoskeletal system: Secondary | ICD-10-CM | POA: Diagnosis not present

## 2017-04-07 DIAGNOSIS — Z9181 History of falling: Secondary | ICD-10-CM | POA: Diagnosis not present

## 2017-04-07 DIAGNOSIS — M6281 Muscle weakness (generalized): Secondary | ICD-10-CM | POA: Diagnosis not present

## 2017-04-07 DIAGNOSIS — R2681 Unsteadiness on feet: Secondary | ICD-10-CM | POA: Diagnosis not present

## 2017-04-08 DIAGNOSIS — M6281 Muscle weakness (generalized): Secondary | ICD-10-CM | POA: Diagnosis not present

## 2017-04-08 DIAGNOSIS — Z9181 History of falling: Secondary | ICD-10-CM | POA: Diagnosis not present

## 2017-04-08 DIAGNOSIS — R29898 Other symptoms and signs involving the musculoskeletal system: Secondary | ICD-10-CM | POA: Diagnosis not present

## 2017-04-08 DIAGNOSIS — R2681 Unsteadiness on feet: Secondary | ICD-10-CM | POA: Diagnosis not present

## 2017-04-11 DIAGNOSIS — Z9181 History of falling: Secondary | ICD-10-CM | POA: Diagnosis not present

## 2017-04-11 DIAGNOSIS — R2681 Unsteadiness on feet: Secondary | ICD-10-CM | POA: Diagnosis not present

## 2017-04-11 DIAGNOSIS — M6281 Muscle weakness (generalized): Secondary | ICD-10-CM | POA: Diagnosis not present

## 2017-04-11 DIAGNOSIS — R29898 Other symptoms and signs involving the musculoskeletal system: Secondary | ICD-10-CM | POA: Diagnosis not present

## 2017-04-12 DIAGNOSIS — I8311 Varicose veins of right lower extremity with inflammation: Secondary | ICD-10-CM | POA: Diagnosis not present

## 2017-04-12 DIAGNOSIS — L309 Dermatitis, unspecified: Secondary | ICD-10-CM | POA: Diagnosis not present

## 2017-04-13 DIAGNOSIS — R29898 Other symptoms and signs involving the musculoskeletal system: Secondary | ICD-10-CM | POA: Diagnosis not present

## 2017-04-13 DIAGNOSIS — R2681 Unsteadiness on feet: Secondary | ICD-10-CM | POA: Diagnosis not present

## 2017-04-13 DIAGNOSIS — M6281 Muscle weakness (generalized): Secondary | ICD-10-CM | POA: Diagnosis not present

## 2017-04-13 DIAGNOSIS — Z9181 History of falling: Secondary | ICD-10-CM | POA: Diagnosis not present

## 2017-04-15 DIAGNOSIS — R29898 Other symptoms and signs involving the musculoskeletal system: Secondary | ICD-10-CM | POA: Diagnosis not present

## 2017-04-15 DIAGNOSIS — Z9181 History of falling: Secondary | ICD-10-CM | POA: Diagnosis not present

## 2017-04-15 DIAGNOSIS — M6281 Muscle weakness (generalized): Secondary | ICD-10-CM | POA: Diagnosis not present

## 2017-04-15 DIAGNOSIS — R2681 Unsteadiness on feet: Secondary | ICD-10-CM | POA: Diagnosis not present

## 2017-04-16 DIAGNOSIS — M6281 Muscle weakness (generalized): Secondary | ICD-10-CM | POA: Diagnosis not present

## 2017-04-16 DIAGNOSIS — Z9181 History of falling: Secondary | ICD-10-CM | POA: Diagnosis not present

## 2017-04-16 DIAGNOSIS — R2681 Unsteadiness on feet: Secondary | ICD-10-CM | POA: Diagnosis not present

## 2017-04-16 DIAGNOSIS — R29898 Other symptoms and signs involving the musculoskeletal system: Secondary | ICD-10-CM | POA: Diagnosis not present

## 2017-04-18 ENCOUNTER — Telehealth: Payer: Self-pay | Admitting: *Deleted

## 2017-04-18 NOTE — Telephone Encounter (Signed)
Attempting to return Mrs. Martorana's earlier telephone message from this afternoon.  Attempted twice to reach patient by telephone at 3:30PM and at 4:00PM.  Telephone was busy at both attempts and I was unable to leave a message.

## 2017-04-19 DIAGNOSIS — R2681 Unsteadiness on feet: Secondary | ICD-10-CM | POA: Diagnosis not present

## 2017-04-19 DIAGNOSIS — Z9181 History of falling: Secondary | ICD-10-CM | POA: Diagnosis not present

## 2017-04-19 DIAGNOSIS — R29898 Other symptoms and signs involving the musculoskeletal system: Secondary | ICD-10-CM | POA: Diagnosis not present

## 2017-04-19 DIAGNOSIS — M6281 Muscle weakness (generalized): Secondary | ICD-10-CM | POA: Diagnosis not present

## 2017-04-20 DIAGNOSIS — Z9181 History of falling: Secondary | ICD-10-CM | POA: Diagnosis not present

## 2017-04-20 DIAGNOSIS — M6281 Muscle weakness (generalized): Secondary | ICD-10-CM | POA: Diagnosis not present

## 2017-04-20 DIAGNOSIS — R29898 Other symptoms and signs involving the musculoskeletal system: Secondary | ICD-10-CM | POA: Diagnosis not present

## 2017-04-20 DIAGNOSIS — R2681 Unsteadiness on feet: Secondary | ICD-10-CM | POA: Diagnosis not present

## 2017-04-21 DIAGNOSIS — M6281 Muscle weakness (generalized): Secondary | ICD-10-CM | POA: Diagnosis not present

## 2017-04-21 DIAGNOSIS — R2681 Unsteadiness on feet: Secondary | ICD-10-CM | POA: Diagnosis not present

## 2017-04-21 DIAGNOSIS — R29898 Other symptoms and signs involving the musculoskeletal system: Secondary | ICD-10-CM | POA: Diagnosis not present

## 2017-04-21 DIAGNOSIS — Z9181 History of falling: Secondary | ICD-10-CM | POA: Diagnosis not present

## 2017-04-22 ENCOUNTER — Telehealth: Payer: Self-pay | Admitting: Vascular Surgery

## 2017-04-22 DIAGNOSIS — R2681 Unsteadiness on feet: Secondary | ICD-10-CM | POA: Diagnosis not present

## 2017-04-22 DIAGNOSIS — M6281 Muscle weakness (generalized): Secondary | ICD-10-CM | POA: Diagnosis not present

## 2017-04-22 DIAGNOSIS — Z9181 History of falling: Secondary | ICD-10-CM | POA: Diagnosis not present

## 2017-04-22 DIAGNOSIS — R29898 Other symptoms and signs involving the musculoskeletal system: Secondary | ICD-10-CM | POA: Diagnosis not present

## 2017-04-22 NOTE — Telephone Encounter (Signed)
-----   Message from Mena Goes, RN sent at 04/22/2017  9:51 AM EDT ----- Regarding: RE: triage question Contact: [phone] I have called and talked to her last week, she just needs an appt for 1st available,  ----- Message ----- From: Lujean Amel Sent: 04/21/2017   3:35 PM To: Mena Goes, RN Subject: triage question                                Zigmund Daniel, Can you call this patient back regarding her L LE pain/swelling x 90 days. She states she has had testing at [facility] recently and wants to see Dr.Dickson or Vinnie Level if possible. I know Davy Pique attempted to reach her by phone yesterday and was unable. Thanks,Annette

## 2017-04-22 NOTE — Telephone Encounter (Signed)
Per the staff message from [facility] I called the patient and scheduled an appointment for her to see Suzanne,NP on 05/03/17 at 1:45pm. I mailed her an appt letter as well. awt

## 2017-04-25 DIAGNOSIS — R29898 Other symptoms and signs involving the musculoskeletal system: Secondary | ICD-10-CM | POA: Diagnosis not present

## 2017-04-25 DIAGNOSIS — Z9181 History of falling: Secondary | ICD-10-CM | POA: Diagnosis not present

## 2017-04-25 DIAGNOSIS — R2681 Unsteadiness on feet: Secondary | ICD-10-CM | POA: Diagnosis not present

## 2017-04-25 DIAGNOSIS — M6281 Muscle weakness (generalized): Secondary | ICD-10-CM | POA: Diagnosis not present

## 2017-04-26 DIAGNOSIS — Z9181 History of falling: Secondary | ICD-10-CM | POA: Diagnosis not present

## 2017-04-26 DIAGNOSIS — R2681 Unsteadiness on feet: Secondary | ICD-10-CM | POA: Diagnosis not present

## 2017-04-26 DIAGNOSIS — M6281 Muscle weakness (generalized): Secondary | ICD-10-CM | POA: Diagnosis not present

## 2017-04-26 DIAGNOSIS — R29898 Other symptoms and signs involving the musculoskeletal system: Secondary | ICD-10-CM | POA: Diagnosis not present

## 2017-04-27 DIAGNOSIS — Z9181 History of falling: Secondary | ICD-10-CM | POA: Diagnosis not present

## 2017-04-27 DIAGNOSIS — R29898 Other symptoms and signs involving the musculoskeletal system: Secondary | ICD-10-CM | POA: Diagnosis not present

## 2017-04-27 DIAGNOSIS — M6281 Muscle weakness (generalized): Secondary | ICD-10-CM | POA: Diagnosis not present

## 2017-04-27 DIAGNOSIS — R2681 Unsteadiness on feet: Secondary | ICD-10-CM | POA: Diagnosis not present

## 2017-04-28 DIAGNOSIS — R29898 Other symptoms and signs involving the musculoskeletal system: Secondary | ICD-10-CM | POA: Diagnosis not present

## 2017-04-28 DIAGNOSIS — R2681 Unsteadiness on feet: Secondary | ICD-10-CM | POA: Diagnosis not present

## 2017-04-28 DIAGNOSIS — M6281 Muscle weakness (generalized): Secondary | ICD-10-CM | POA: Diagnosis not present

## 2017-04-28 DIAGNOSIS — Z9181 History of falling: Secondary | ICD-10-CM | POA: Diagnosis not present

## 2017-05-02 DIAGNOSIS — R2681 Unsteadiness on feet: Secondary | ICD-10-CM | POA: Diagnosis not present

## 2017-05-02 DIAGNOSIS — R29898 Other symptoms and signs involving the musculoskeletal system: Secondary | ICD-10-CM | POA: Diagnosis not present

## 2017-05-02 DIAGNOSIS — Z9181 History of falling: Secondary | ICD-10-CM | POA: Diagnosis not present

## 2017-05-02 DIAGNOSIS — M6281 Muscle weakness (generalized): Secondary | ICD-10-CM | POA: Diagnosis not present

## 2017-05-03 ENCOUNTER — Encounter: Payer: Self-pay | Admitting: Family

## 2017-05-03 ENCOUNTER — Other Ambulatory Visit: Payer: Self-pay

## 2017-05-03 ENCOUNTER — Ambulatory Visit (INDEPENDENT_AMBULATORY_CARE_PROVIDER_SITE_OTHER): Payer: Medicare Other | Admitting: Family

## 2017-05-03 VITALS — BP 138/73 | HR 62 | Temp 98.1°F | Resp 14 | Ht 60.0 in | Wt 94.0 lb

## 2017-05-03 DIAGNOSIS — I6521 Occlusion and stenosis of right carotid artery: Secondary | ICD-10-CM

## 2017-05-03 DIAGNOSIS — I6522 Occlusion and stenosis of left carotid artery: Secondary | ICD-10-CM | POA: Diagnosis not present

## 2017-05-03 DIAGNOSIS — M79662 Pain in left lower leg: Secondary | ICD-10-CM | POA: Diagnosis not present

## 2017-05-03 NOTE — Progress Notes (Signed)
Chief Complaint: with history of Extracranial Carotid Artery Stenosis   History of Present Illness  Vanessa [name] is a 84 y.o. female patient that Dr. Scot Dock has been monitoring for carotid artery stenosis; she has a known left internal carotid artery occlusion and minimal right ICA stenosis.  She returns today from [facility] with c/o left foot, ankle, and lower leg intermittent pain that started about 3 months ago.  Had ABI's done in March 2019 at another facility which were normal bilaterally with triphasic waveforms. Lower left leg x-ray results were normal.  She does not know if she has been tested for gout. She walks daily with her walker.  I saw her on 08/27/15 with c/o small patches of pruritic rash at both ankles, and at her left wrist under her watch. These have resolved.  She has lower legs varicosities with no edema. She spoke with our vein clinic nurse in the past re her varicosities and was given a pair of compression hose, but cannot get them on due to the weakness in her wrists. Vanessa [name], vein clinic nurse,spoke with pt subsequently and discussed donning compression hose.  Patient has not had previous carotid artery intervention.  She denies anyhistory of TIA or stroke symptoms.Specifically she denies a history ofamaurosis fugax or monocular blindness, unilateralfacial drooping, hemiplegia, or receptive or expressive aphasia.   Shehad some presyncopal episodes, has fallen several times. 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   She resides at [facility].    Diabetic: No Tobacco use: non-smoker  Pt meds include: Statin : Yes ASA: yes Other anticoagulants/antiplatelets: none, since she has a history of GI bleed or has fallen several times, has chronic atrial fib    Past Medical History:  Diagnosis Date  . Abdominal bloating   . Atrial fibrillation ([place])   . Bruises easily   . Carotid artery  occlusion    LEFT  . Dizziness   . DOE (dyspnea on exertion) 04/02/2014  . Fall at home Sept 2013, Dec. 2013  Jun 08, 2012  . GERD (gastroesophageal reflux disease) 02/11/2015  . Headache(784.0)   . Hoarseness   . Hypercholesterolemia   . Hyperglycemia 04/02/2014   Glucose 178 mg percent on 01/22/2014 04/05/14 Hgb A1c 6.6 Diet controlled.    . Hypothyroidism 04/15/2015  . Neuropathy    PERIPHERAL  . Pruritus   . Scoliosis   . Varicose veins     Social History Social History   Tobacco Use  . Smoking status: Never Smoker  . Smokeless tobacco: Never Used  Substance Use Topics  . Alcohol use: No    Alcohol/week: 0.0 oz  . Drug use: No    Family History Family History  Adopted: Yes  Problem Relation Age of Onset  . Diabetes Mother   . Heart attack Mother   . Heart disease Mother        After age 90  . Heart attack Father   . Stroke Father   . Breast cancer Sister   . Heart disease Maternal Grandmother   . Cancer Son        adopted son. esophagus, stomach, liver  . Stomach cancer Neg Hx   . Colon cancer Neg Hx     Surgical History Past Surgical History:  Procedure Laterality Date  . ABDOMINAL HYSTERECTOMY  1954  . CHOLECYSTECTOMY  1997  . ESOPHAGOGASTRODUODENOSCOPY (EGD) WITH PROPOFOL N/A 11/25/2016   Procedure: ESOPHAGOGASTRODUODENOSCOPY (EGD) WITH PROPOFOL;  Surgeon: [name], MD;  Location: [facility];  Service: Endoscopy;  Laterality: N/A;  . SPINE SURGERY  1997   correct scoliosis    No Known Allergies  Current Outpatient Medications  Medication Sig Dispense Refill  . acetaminophen (TYLENOL) 500 MG tablet Take 500 mg by mouth 2 (two) times daily.     Marland Kitchen antiseptic oral rinse (BIOTENE) LIQD 15 mLs by Mouth Rinse route as needed for dry mouth.    Marland Kitchen aspirin EC 81 MG tablet Take 81 mg daily by mouth.    Marland Kitchen atenolol (TENORMIN) 25 MG tablet Take 0.5 tablets (12.5 mg total) by mouth 2 (two) times daily. 60 tablet 0  . Biotin 5000 MCG TABS Take 5,000 mcg  by mouth daily.    . Docosanol (ABREVA) 10 % CREA Apply 1 application topically 5 (five) times daily.    . DULoxetine (CYMBALTA) 20 MG capsule TAKE 1 CAPSULE AT BEDTIME. 30 capsule 11  . fluticasone (FLONASE) 50 MCG/ACT nasal spray Place 2 sprays into both nostrils daily.    Marland Kitchen lactose free nutrition (BOOST) LIQD Take 237 mLs by mouth 3 (three) times daily between meals.    . multivitamin-iron-minerals-folic acid (CENTRUM) chewable tablet Chew 1 tablet daily by mouth.    . ondansetron (ZOFRAN) 4 MG tablet Take 1 tablet (4 mg total) by mouth every 6 (six) hours as needed for nausea. 20 tablet 0  . polyethylene glycol (MIRALAX / GLYCOLAX) packet Take 17 g daily as needed by mouth (for constipation).     . simvastatin (ZOCOR) 40 MG tablet Take 20 mg at bedtime by mouth.     . traMADol (ULTRAM) 50 MG tablet Take 50 mg by mouth every 8 (eight) hours as needed.     No current facility-administered medications for this visit.     Review of Systems : See HPI for pertinent positives and negatives.  Physical Examination  Vitals:   05/03/17 1344  BP: 138/73  Pulse: 62  Resp: 14  Temp: 98.1 F (36.7 C)  TempSrc: Oral  Weight: 94 lb (42.6 kg)  Height: 5' (1.524 m)   Body mass index is 18.36 kg/m.  General: WDWN elderly female in NAD GAIT:slow, deliberate, with assistance HENT: No gross abnormalities   Eyes: PERRLA Pulmonary: Respirations are non-labored, adequate air movement, CTAB Cardiac: Irregular rhythm, controlled rate.  VASCULAR EXAM Carotid Bruits Right Left   Negative Negative   Radial pulses are 2+ palpable and equal. Abdominal aortic pulse is not palpable       LE Pulses Right Left   POPLITEAL not palpable  not palpable   POSTERIOR TIBIAL 1+ palpable  1+ palpable    DORSALIS PEDIS   ANTERIOR TIBIAL 2+ palpable  2+ palpable     Gastrointestinal: soft, nontender, BS WNL, no r/g, no palpable masses. Musculoskeletal: Age appropriate generalized muscle atrophy/wasting. M/S 4/5 throughout, Extremities without ischemic changes, small varicosities in both lower legs.  Skin: No rashes, no ulcers, no cellulitis.   Neurologic:  A&O X 3; appropriate affect, sensation is normal; speech is normal, CN 2-12 intact except for mild hearing loss, pain and light touch intact in extremities, motor exam as listed above. Psychiatric: Normal thought content, mood appropriate to clinical situation.    Assessment: ARFA LAMARCA is a 84 y.o. female whohas a known left internal carotid artery occlusion and minimal right ICA stenosis. She has no history of stroke or TIA.  She c/o intermittent left lower leg pain x 3 months, not worsening.  I discussed with Dr. Donnetta Hutching pt HPI, normal ABI and left lower leg x-ray results that came with her. Clinically it appears there is a low probability of DVT in her left lower leg; even if there was, she cannot take anticoagulants since she has a hx of GI bleeds on these, which had to be stopped, she remains in atrial fib.   She denies dyspnea. Her lung sounds are clear.  Consider revlautation for gout, topical NSAIDS, po tylenol or tramadol for pain releif at night, will defer to her PCP re this.     DATA Carotid Duplex (09-10-15): 1-39% right ICA stenosis and known left internal carotid artery occlusion. Both vertebral arteries have antegrade flow. Both subclavian arteries have multiphasic waveforms.  No change since previous exams on 08/22/2013 and 09/04/14.   Plan: Follow-up in September 2019 for carotid duplex.   I discussed in depth with the patient the nature of atherosclerosis, and emphasized the importance of maximal medical management including strict control of blood pressure, blood glucose, and lipid levels, obtaining regular  exercise, and continued cessation of smoking.  The patient is aware that without maximal medical management the underlying atherosclerotic disease process will progress, limiting the benefit of any interventions. The patient was given information about stroke prevention and what symptoms should prompt the patient to seek immediate medical care. Thank you for allowing [place] to participate in this patient's care.  Clemon Chambers, RN, MSN, FNP-C [facility] Office: [phone]  Clinic Physician: Early  05/03/17 9:20 PM

## 2017-05-05 ENCOUNTER — Non-Acute Institutional Stay: Payer: Medicare Other | Admitting: Family

## 2017-05-05 ENCOUNTER — Encounter: Payer: Self-pay | Admitting: Family

## 2017-05-05 ENCOUNTER — Encounter: Payer: Self-pay | Admitting: Internal Medicine

## 2017-05-05 DIAGNOSIS — E782 Mixed hyperlipidemia: Secondary | ICD-10-CM | POA: Diagnosis not present

## 2017-05-05 DIAGNOSIS — M79605 Pain in left leg: Secondary | ICD-10-CM

## 2017-05-05 DIAGNOSIS — I482 Chronic atrial fibrillation, unspecified: Secondary | ICD-10-CM

## 2017-05-05 DIAGNOSIS — K5901 Slow transit constipation: Secondary | ICD-10-CM

## 2017-05-05 DIAGNOSIS — M6281 Muscle weakness (generalized): Secondary | ICD-10-CM | POA: Diagnosis not present

## 2017-05-05 DIAGNOSIS — I739 Peripheral vascular disease, unspecified: Secondary | ICD-10-CM | POA: Diagnosis not present

## 2017-05-05 DIAGNOSIS — R2681 Unsteadiness on feet: Secondary | ICD-10-CM | POA: Diagnosis not present

## 2017-05-05 DIAGNOSIS — R29898 Other symptoms and signs involving the musculoskeletal system: Secondary | ICD-10-CM | POA: Diagnosis not present

## 2017-05-05 DIAGNOSIS — Z9181 History of falling: Secondary | ICD-10-CM | POA: Diagnosis not present

## 2017-05-08 DIAGNOSIS — R2681 Unsteadiness on feet: Secondary | ICD-10-CM | POA: Diagnosis not present

## 2017-05-08 DIAGNOSIS — R29898 Other symptoms and signs involving the musculoskeletal system: Secondary | ICD-10-CM | POA: Diagnosis not present

## 2017-05-08 DIAGNOSIS — M6281 Muscle weakness (generalized): Secondary | ICD-10-CM | POA: Diagnosis not present

## 2017-05-08 DIAGNOSIS — Z9181 History of falling: Secondary | ICD-10-CM | POA: Diagnosis not present

## 2017-05-10 NOTE — Progress Notes (Signed)
Location:  [place] Room Number: 1 Place of Service:  ALF [phone]) Provider: Anikka Marsan FNP-C   Blanchie Serve, MD  Patient Care Team: Blanchie Serve, MD as PCP - General (Internal Medicine) Angelia Mould, MD as Attending Physician (Vascular Surgery) [name], MD as Attending Physician (Obstetrics and Gynecology) [name], MD (Orthopedic Surgery) [name], MD (Ophthalmology) [name], MD as Consulting Physician (Urology) [name], MD as Consulting Physician (Cardiology) [name], PA-C as Physician Assistant (Otolaryngology) [name], MD as Consulting Physician (Otolaryngology) [name], MD as Consulting Physician (Dermatology) [name], MD as Consulting Physician (Physical Medicine and Rehabilitation) [name], MD as Attending Physician (Gastroenterology) Mast, Man X, NP as Nurse Practitioner (Internal Medicine) Kathrynn Ducking, MD as Consulting Physician (Neurology) Debara Pickett Nadean Corwin, MD as Consulting Physician (Cardiology) Druscilla Brownie, MD as Consulting Physician (Dermatology)  Extended Emergency Contact Information Primary Emergency Contact: [name] of [place] Phone: [phone] Mobile Phone: [phone] Relation: Son  Code Status: Full code  Goals of care: Advanced Directive information Advanced Directives 05/05/2017  Does Patient Have a Medical Advance Directive? Yes  Type of Advance Directive [place]  Does patient want to make changes to medical advance directive? -  Copy of [place] in Chart? Yes  Would patient like information on creating a medical advance directive? -     Chief Complaint  Patient presents with  . Medical Management of Chronic Issues    HPI:  Pt is a 84 y.o. female seen today [place] for medical management of chronic diseases.she has a medical history of  Afib,CAD,Hyperlipidemia,Neuropathy,PVD,Varicose veins,scoliosis,GERD,dysphagia among other conditions.she is seen in her room today with facility Nurse present.she continues to complain of left lateral leg pain with touch.she denies any pain with walking.she was recently seen by vascular and vein specialist in [place] as referred by Dr.pandey.Management of pain recommended with Tylenol,tramadol or Voltaren gel.she denies any fever,chills or ulcer to left leg.No recent fall episodes or weight changes.Facility Nurse reports no new concerns.      Past Medical History:  Diagnosis Date  . Abdominal bloating   . Atrial fibrillation ([place])   . Bruises easily   . Carotid artery occlusion    LEFT  . Dizziness   . DOE (dyspnea on exertion) 04/02/2014  . Fall at home Sept 2013, Dec. 2013  Jun 08, 2012  . GERD (gastroesophageal reflux disease) 02/11/2015  . Headache(784.0)   . Hoarseness   . Hypercholesterolemia   . Hyperglycemia 04/02/2014   Glucose 178 mg percent on 01/22/2014 04/05/14 Hgb A1c 6.6 Diet controlled.    . Hypothyroidism 04/15/2015  . Neuropathy    PERIPHERAL  . Pruritus   . Scoliosis   . Varicose veins    Past Surgical History:  Procedure Laterality Date  . ABDOMINAL HYSTERECTOMY  1954  . CHOLECYSTECTOMY  1997  . ESOPHAGOGASTRODUODENOSCOPY (EGD) WITH PROPOFOL N/A 11/25/2016   Procedure: ESOPHAGOGASTRODUODENOSCOPY (EGD) WITH PROPOFOL;  Surgeon: [name], MD;  Location: [facility];  Service: Endoscopy;  Laterality: N/A;  . [place]   correct scoliosis    No Known Allergies  Allergies as of 05/05/2017   No Known Allergies     Medication List        Accurate as of 05/05/17 11:59 PM. Always use your most recent med list.          acetaminophen 500 MG tablet Commonly known as:  TYLENOL Take 500 mg by mouth 2 (two) times daily.   aspirin EC 81 MG tablet Take 81 mg daily by mouth.   atenolol 25 MG tablet Commonly known as:   TENORMIN Take 0.5 tablets (12.5 mg total) by mouth 2 (two) times daily.   Biotin 5000 MCG Tabs Take 5,000 mcg by mouth daily.   DULoxetine 20 MG capsule Commonly known as:  CYMBALTA TAKE 1 CAPSULE AT BEDTIME.   fluticasone 50 MCG/ACT nasal spray Commonly known as:  FLONASE Place 2 sprays into both nostrils daily.   hydrogen peroxide 1.5 % Soln Apply 1 application topically as needed (Swish and spit for dry mouth).   lactose free nutrition Liqd Take 237 mLs by mouth 3 (three) times daily between meals.   multivitamin-iron-minerals-folic acid chewable tablet Chew 1 tablet daily by mouth.   ondansetron 4 MG tablet Commonly known as:  ZOFRAN Take 1 tablet (4 mg total) by mouth every 6 (six) hours as needed for nausea.   polyethylene glycol packet Commonly known as:  MIRALAX / GLYCOLAX Take 17 g daily as needed by mouth (for constipation).   simvastatin 40 MG tablet Commonly known as:  ZOCOR Take 20 mg at bedtime by mouth.   traMADol 50 MG tablet Commonly known as:  ULTRAM Take 50 mg by mouth every 8 (eight) hours as needed.   triamcinolone cream 0.1 % Commonly known as:  KENALOG Apply 1 application topically as needed.   VIACTIV [phone] MG-UNT-MCG Chew Generic drug:  Calcium-Vitamin D-Vitamin K Chew 1 Dose by mouth daily.       Review of Systems  Constitutional: Negative for appetite change, chills, fatigue and fever.  HENT: Negative for congestion, rhinorrhea, sinus pressure, sinus pain, sneezing and sore throat.   Eyes: Positive for visual disturbance. Negative for discharge, redness and itching.       Wears eye glasses  Respiratory: Negative for cough, chest tightness, shortness of breath and wheezing.   Cardiovascular: Negative for chest pain, palpitations and leg swelling.  Gastrointestinal: Negative for abdominal distention, abdominal pain, constipation, diarrhea, nausea and vomiting.  Endocrine: Negative for cold intolerance, heat intolerance,  polydipsia, polyphagia and polyuria.  Genitourinary: Negative for dysuria, flank pain, frequency and urgency.  Musculoskeletal: Positive for arthralgias and gait problem.  Skin: Negative for color change, pallor, rash and wound.  Neurological: Negative for dizziness, light-headedness, numbness and headaches.  Hematological: Does not bruise/bleed easily.  Psychiatric/Behavioral: Negative for agitation and sleep disturbance. The patient is not nervous/anxious.     Immunization History  Administered Date(s) Administered  . Influenza Split 10/20/2011, 11/03/2012  . Influenza,inj,Quad PF,6+ Mos 09/29/2012, 11/09/2013  . Influenza-Unspecified 10/17/2014, 10/23/2015, 10/20/2016  . PPD Test 08/21/2013, 03/04/2014  . Pneumococcal Conjugate-13 08/21/2013  . Tdap 05/20/2008  . Zoster 11/01/2012   Pertinent  Health Maintenance Due  Topic Date Due  . DEXA SCAN  01/31/1987  . PNA vac Low Risk Adult (2 of 2 - PPSV23) 08/22/2014  . INFLUENZA VACCINE  08/11/2017   Fall Risk  09/22/2016 08/25/2016 05/31/2016 09/02/2015 04/29/2015  Falls in the past year? Yes Yes No No No  Number falls in past yr: 1 2 or more - - -  Comment - - - - -  Injury with Fall? Yes Yes - - -  Comment fx 3 ribs - - - -  Risk Factor Category  - - - - -  Risk for fall due to : Impaired mobility;Impaired balance/gait - - - -  Risk for fall due to: Comment - - - - -  Functional Status Survey:    Vitals:   05/05/17 1057  BP: (!) 142/80  Pulse: 86  Resp: 17  Temp: (!) 96.7 F (35.9 C)  SpO2: 90%  Weight: 92 lb 9.6 oz (42 kg)  Height: 5' (1.524 m)   Body mass index is 18.08 kg/m. Physical Exam  Constitutional: She is oriented to person, place, and time.  Thin built elderly in no acute distress   HENT:  Head: Normocephalic.  Right Ear: External ear normal.  Left Ear: External ear normal.  Mouth/Throat: Oropharynx is clear and moist. No oropharyngeal exudate.  Eyes: Pupils are equal, round, and reactive to light.  Conjunctivae and EOM are normal. Right eye exhibits no discharge. Left eye exhibits no discharge. No scleral icterus.  Eye glasses in place   Neck: Normal range of motion. No JVD present. No thyromegaly present.  Cardiovascular: Intact distal pulses. Exam reveals no gallop and no friction rub.  Murmur heard. Pulmonary/Chest: Effort normal and breath sounds normal. No stridor. No respiratory distress. She has no wheezes. She has no rales.  Abdominal: Soft. Bowel sounds are normal. She exhibits no distension and no mass. There is no tenderness. There is no rebound and no guarding.  Musculoskeletal: She exhibits no edema or tenderness.  Unsteady gait uses walker.moves x extremities strength UE/LE 4/5   Lymphadenopathy:    She has no cervical adenopathy.  Neurological: She is oriented to person, place, and time. Gait abnormal.  Skin: Skin is warm and dry. No rash noted. No erythema. No pallor.  Left lower lateral leg tender to palpation no swelling,drainge or redness noted. Varicose vein noted.   Psychiatric: She has a normal mood and affect. Her behavior is normal.  Nursing note and vitals reviewed.   Labs reviewed: Recent Labs    12/02/16 0511 12/04/16 0220 12/07/16 0750 12/16/16 03/07/17  NA 138 135 135 137 140  140  K 4.2 4.2 3.9 4.8 4.9  4.9  CL 103 104 102  --   --   CO2 28 25 26   --   --   GLUCOSE 110* 89 100*  --   --   BUN 15 10 13 17 14   CREATININE 0.72 0.65 0.72 0.8 0.8  0.77  CALCIUM 8.7* 8.5* 8.9  --  9.1  MG 1.9  --  2.1  --   --    Recent Labs    07/29/16 0740 12/04/16 0220 03/07/17  AST 19 24 22  22   ALT 9 13* 13  13  ALKPHOS 80 75 71  71  BILITOT 0.6 0.6 0.6  PROT 5.7* 5.5* 5.7  ALBUMIN 3.2* 2.7* 3.3   Recent Labs    12/01/16 1904 12/02/16 0511 12/04/16 0220 12/16/16 02/18/17 03/07/17  WBC 8.4 11.1* 9.9 6.9 7.8  7.8 6.9  NEUTROABS 6.3 8.0*  --   --   --   --   HGB 13.8 11.5* 11.6* 11.8* 13.5  13.5 11.9  HCT 42.0 35.1* 34.6* 34* 39  38.9 34    MCV 98.4 98.3 98.0  --   --   --   PLT 221 199 213 276 256  --    Lab Results  Component Value Date   TSH 1.272 12/02/2016   Lab Results  Component Value Date   HGBA1C 6.4 07/07/2015   Lab Results  Component Value Date   CHOL 151 02/10/2017   HDL 76 (A) 02/10/2017   LDLCALC 54 02/10/2017   TRIG 125 02/10/2017   CHOLHDL 2.1  07/29/2016    Significant Diagnostic Results in last 30 days:  No results found.  Assessment/Plan 1. Chronic atrial fibrillation  HR controlled.Continue on ASA EC 81 mg tablet.  2. PVD (peripheral vascular disease) No edema or ulcer noted.continue to encourage to elevate legs when seated.encouraged knee high ted hose on in the morning and off at bedtime.   3. Mixed hyperlipidemia Continue on simvastatin 20 mg tablet daily at bedtime.check lipid panel periodically.   4. Left leg pain No redness,swelling or drainage.tender with palpation.Has had ABI's negative.CT scan on 04/01/2017 negative for fracture,or dislocation.extensive arterial vascular calcification in the lower leg.Patient referred to vascular specialist seen 05/03/2017 recommended pain management with tylenol,volateran gel or tramadol.will start  On Voltaren 4% gel apply every 8 hour for pain .     5. Slow transit constipation Current regimen effective.continue to encourage oral intake and hydration.   Family/ staff Communication: Reviewed plan of care with patient and facility Nurse supervisor   Labs/tests ordered: None   Sandrea Hughs, NP

## 2017-05-23 ENCOUNTER — Other Ambulatory Visit: Payer: Self-pay

## 2017-05-23 DIAGNOSIS — I6521 Occlusion and stenosis of right carotid artery: Secondary | ICD-10-CM

## 2017-06-21 DIAGNOSIS — J310 Chronic rhinitis: Secondary | ICD-10-CM | POA: Diagnosis not present

## 2017-06-21 DIAGNOSIS — R0982 Postnasal drip: Secondary | ICD-10-CM | POA: Diagnosis not present

## 2017-08-01 ENCOUNTER — Non-Acute Institutional Stay: Payer: Medicare Other | Admitting: Internal Medicine

## 2017-08-01 ENCOUNTER — Encounter: Payer: Self-pay | Admitting: Internal Medicine

## 2017-08-01 DIAGNOSIS — I482 Chronic atrial fibrillation, unspecified: Secondary | ICD-10-CM

## 2017-08-01 DIAGNOSIS — G3184 Mild cognitive impairment, so stated: Secondary | ICD-10-CM | POA: Diagnosis not present

## 2017-08-01 DIAGNOSIS — E782 Mixed hyperlipidemia: Secondary | ICD-10-CM

## 2017-08-01 DIAGNOSIS — E440 Moderate protein-calorie malnutrition: Secondary | ICD-10-CM | POA: Diagnosis not present

## 2017-08-01 DIAGNOSIS — E039 Hypothyroidism, unspecified: Secondary | ICD-10-CM

## 2017-08-01 DIAGNOSIS — K219 Gastro-esophageal reflux disease without esophagitis: Secondary | ICD-10-CM

## 2017-08-01 NOTE — Progress Notes (Signed)
Location:  [place] Room Number: 1 Place of Service:  ALF [phone]) Provider:  Blanchie Serve MD  Blanchie Serve, MD  Patient Care Team: Blanchie Serve, MD as PCP - General (Internal Medicine) Angelia Mould, MD as Attending Physician (Vascular Surgery) [name], MD as Attending Physician (Obstetrics and Gynecology) [name], MD (Orthopedic Surgery) [name], MD (Ophthalmology) [name], MD as Consulting Physician (Urology) [name], MD as Consulting Physician (Cardiology) [name], PA-C as Physician Assistant (Otolaryngology) [name], MD as Consulting Physician (Otolaryngology) [name], MD as Consulting Physician (Dermatology) [name], MD as Consulting Physician (Physical Medicine and Rehabilitation) [name], MD as Attending Physician (Gastroenterology) Mast, Man X, NP as Nurse Practitioner (Internal Medicine) Kathrynn Ducking, MD as Consulting Physician (Neurology) Debara Pickett Nadean Corwin, MD as Consulting Physician (Cardiology) Druscilla Brownie, MD as Consulting Physician (Dermatology)  Extended Emergency Contact Information Primary Emergency Contact: [name] of [place] Phone: [phone] Mobile Phone: [phone] Relation: Son   Goals of care: Advanced Directive information Advanced Directives 08/01/2017  Does Patient Have a Medical Advance Directive? Yes  Type of Advance Directive [place]  Does patient want to make changes to medical advance directive? No - Patient declined  Copy of [place] in Chart? Yes  Would patient like information on creating a medical advance directive? -     Chief Complaint  Patient presents with  . Medical Management of Chronic Issues    Routine Visit     HPI:  Pt is a 84 y.o. female seen today for medical management of chronic diseases. She has been at her baseline. She denies any acute  concern this visit. Her back pain continues to bother her. She follows with orthopedic team for this. She is taking nutritional supplement. miralax as needed and prune juice helps with her bowel movement. cymbalta helps with her pain some. Few elevated SBP reading but most of readings under 140. Taking atenolol 12.5 mg bid.    Past Medical History:  Diagnosis Date  . Abdominal bloating   . Atrial fibrillation ([place])   . Bruises easily   . Carotid artery occlusion    LEFT  . Dizziness   . DOE (dyspnea on exertion) 04/02/2014  . Fall at home Sept 2013, Dec. 2013  Jun 08, 2012  . GERD (gastroesophageal reflux disease) 02/11/2015  . Headache(784.0)   . Hoarseness   . Hypercholesterolemia   . Hyperglycemia 04/02/2014   Glucose 178 mg percent on 01/22/2014 04/05/14 Hgb A1c 6.6 Diet controlled.    . Hypothyroidism 04/15/2015  . Neuropathy    PERIPHERAL  . Pruritus   . Scoliosis   . Varicose veins    Past Surgical History:  Procedure Laterality Date  . ABDOMINAL HYSTERECTOMY  1954  . CHOLECYSTECTOMY  1997  . ESOPHAGOGASTRODUODENOSCOPY (EGD) WITH PROPOFOL N/A 11/25/2016   Procedure: ESOPHAGOGASTRODUODENOSCOPY (EGD) WITH PROPOFOL;  Surgeon: [name], MD;  Location: [facility];  Service: Endoscopy;  Laterality: N/A;  . [place]   correct scoliosis    No Known Allergies  Outpatient Encounter Medications as of 08/01/2017  Medication Sig  . acetaminophen (TYLENOL) 500 MG tablet Take 500 mg by mouth 2 (two) times daily.   Marland Kitchen aspirin EC 81 MG tablet Take 81 mg daily by mouth.  Marland Kitchen atenolol (TENORMIN) 25 MG tablet Take 0.5 tablets (12.5 mg total) by mouth 2 (two) times daily.  . Biotin 5000 MCG TABS Take 5,000  mcg by mouth daily.  . Calcium-Vitamin D-Vitamin K (VIACTIV) 102-725-36 MG-UNT-MCG CHEW Chew 1 Dose by mouth daily.  . DULoxetine (CYMBALTA) 20 MG capsule TAKE 1 CAPSULE AT BEDTIME.  . fluticasone (FLONASE) 50 MCG/ACT nasal spray Place 2 sprays into both nostrils daily.    . hydrogen peroxide 1.5 % SOLN Apply 1 application topically as needed (Swish and spit for dry mouth).  Marland Kitchen ipratropium (ATROVENT) 0.06 % nasal spray Place 2 sprays into both nostrils 2 (two) times daily as needed for rhinitis.  Marland Kitchen lactose free nutrition (BOOST) LIQD Take 237 mLs by mouth 3 (three) times daily between meals.  . Menthol, Topical Analgesic, (BIOFREEZE ROLL-ON) 4 % GEL Apply 1 application topically every 8 (eight) hours. Apply to LLE  . ondansetron (ZOFRAN) 4 MG tablet Take 1 tablet (4 mg total) by mouth every 6 (six) hours as needed for nausea.  . polyethylene glycol (MIRALAX / GLYCOLAX) packet Take 17 g daily as needed by mouth (for constipation).   . simvastatin (ZOCOR) 40 MG tablet Take 20 mg at bedtime by mouth.   . sodium phosphate (FLEET) 7-19 GM/118ML ENEM Place 1 enema rectally as needed for severe constipation.  . traMADol (ULTRAM) 50 MG tablet Take 50 mg by mouth every 8 (eight) hours as needed.  . triamcinolone cream (KENALOG) 0.1 % Apply 1 application topically as needed.  . [DISCONTINUED] multivitamin-iron-minerals-folic acid (CENTRUM) chewable tablet Chew 1 tablet daily by mouth.   No facility-administered encounter medications on file as of 08/01/2017.     Review of Systems  Immunization History  Administered Date(s) Administered  . Influenza Split 10/20/2011, 11/03/2012  . Influenza,inj,Quad PF,6+ Mos 09/29/2012, 11/09/2013  . Influenza-Unspecified 10/17/2014, 10/23/2015, 10/20/2016  . PPD Test 08/21/2013, 03/04/2014  . Pneumococcal Conjugate-13 08/21/2013  . Tdap 05/20/2008  . Zoster 11/01/2012   Pertinent  Health Maintenance Due  Topic Date Due  . DEXA SCAN  01/31/1987  . PNA vac Low Risk Adult (2 of 2 - PPSV23) 08/22/2014  . INFLUENZA VACCINE  08/11/2017   Fall Risk  09/22/2016 08/25/2016 05/31/2016 09/02/2015 04/29/2015  Falls in the past year? Yes Yes No No No  Number falls in past yr: 1 2 or more - - -  Comment - - - - -  Injury with Fall? Yes Yes -  - -  Comment fx 3 ribs - - - -  Risk Factor Category  - - - - -  Risk for fall due to : Impaired mobility;Impaired balance/gait - - - -  Risk for fall due to: Comment - - - - -   Functional Status Survey:    Vitals:   08/01/17 1007  BP: 129/81  Pulse: 81  Resp: 18  Temp: 98.7 F (37.1 C)  TempSrc: Oral  SpO2: 94%  Weight: 91 lb 6.4 oz (41.5 kg)  Height: 5\' 2"  (1.575 m)   Body mass index is 16.72 kg/m.   Wt Readings from Last 3 Encounters:  08/01/17 91 lb 6.4 oz (41.5 kg)  05/05/17 92 lb 9.6 oz (42 kg)  05/03/17 94 lb (42.6 kg)   Physical Exam  Constitutional: She is oriented to person, place, and time. No distress.  Thin built frail elderly female  HENT:  Head: Normocephalic and atraumatic.  Mouth/Throat: Oropharynx is clear and moist. No oropharyngeal exudate.  Eyes: Pupils are equal, round, and reactive to light. Conjunctivae are normal. Right eye exhibits no discharge. Left eye exhibits no discharge.  Neck: Normal range of motion. Neck supple.  Cardiovascular:  Normal rate and regular rhythm.  Pulmonary/Chest: Effort normal and breath sounds normal. She has no wheezes. She has no rales.  Abdominal: Soft. Bowel sounds are normal. There is no tenderness.  Musculoskeletal: She exhibits no edema.  Able to move all 4 extremities, unsteady gait.   Lymphadenopathy:    She has no cervical adenopathy.  Neurological: She is alert and oriented to person, place, and time.  Skin: Skin is warm and dry. She is not diaphoretic.  Psychiatric: She has a normal mood and affect. Her behavior is normal.    Labs reviewed: Recent Labs    12/02/16 0511 12/04/16 0220 12/07/16 0750 12/16/16 03/07/17  NA 138 135 135 137 140  140  K 4.2 4.2 3.9 4.8 4.9  4.9  CL 103 104 102  --   --   CO2 28 25 26   --   --   GLUCOSE 110* 89 100*  --   --   BUN 15 10 13 17 14   CREATININE 0.72 0.65 0.72 0.8 0.8  0.77  CALCIUM 8.7* 8.5* 8.9  --  9.1  MG 1.9  --  2.1  --   --    Recent Labs     12/04/16 0220 03/07/17  AST 24 22  22   ALT 13* 13  13  ALKPHOS 75 71  71  BILITOT 0.6 0.6  PROT 5.5* 5.7  ALBUMIN 2.7* 3.3   Recent Labs    12/01/16 1904 12/02/16 0511 12/04/16 0220 12/16/16 02/18/17 03/07/17  WBC 8.4 11.1* 9.9 6.9 7.8  7.8 6.9  NEUTROABS 6.3 8.0*  --   --   --   --   HGB 13.8 11.5* 11.6* 11.8* 13.5  13.5 11.9  HCT 42.0 35.1* 34.6* 34* 39  38.9 34  MCV 98.4 98.3 98.0  --   --   --   PLT 221 199 213 276 256  --    Lab Results  Component Value Date   TSH 1.272 12/02/2016   Lab Results  Component Value Date   HGBA1C 6.4 07/07/2015   Lab Results  Component Value Date   CHOL 151 02/10/2017   HDL 76 (A) 02/10/2017   LDLCALC 54 02/10/2017   TRIG 125 02/10/2017   CHOLHDL 2.1 07/29/2016    Significant Diagnostic Results in last 30 days:  No results found.  Assessment/Plan  1. Chronic atrial fibrillation (HCC) Continue atenolol 12.5 mg bid  2. Gastroesophageal reflux disease without esophagitis Controlled symptom. Off medication. monitor  3. Acquired hypothyroidism Lab Results  Component Value Date   TSH 1.272 12/02/2016   Check TSH  4. Mixed hyperlipidemia Continue simvastatin 20 mg daily and check lipid panel. Continue aspirin.   5. Moderate protein-calorie malnutrition (HCC) Continue boost supplement, monitor po intake and weight  6. Mild cognitive impairment Supportive care. Pt has shown interest in driving and wants to be cleared for it. Obtain MMSE.     Family/ staff Communication: reviewed care plan with patient and charge nurse.    Labs/tests ordered:  Cbc, cmp, lipid, TSH   Blanchie Serve, MD Internal Medicine [facility] [address] [place], [place] [address] Cell Phone (Monday-Friday 8 am - 5 pm): [phone] On Call: [phone] and follow prompts after 5 pm and on weekends Office Phone: [phone] Office Fax: [phone]

## 2017-08-04 DIAGNOSIS — D649 Anemia, unspecified: Secondary | ICD-10-CM | POA: Diagnosis not present

## 2017-08-04 DIAGNOSIS — E039 Hypothyroidism, unspecified: Secondary | ICD-10-CM | POA: Diagnosis not present

## 2017-08-04 DIAGNOSIS — E785 Hyperlipidemia, unspecified: Secondary | ICD-10-CM | POA: Diagnosis not present

## 2017-08-04 LAB — HEPATIC FUNCTION PANEL
ALT: 13 (ref 7–35)
AST: 24 (ref 13–35)
Alkaline Phosphatase: 67 (ref 25–125)
Bilirubin, Total: 0.6

## 2017-08-04 LAB — BASIC METABOLIC PANEL
BUN: 17 (ref 4–21)
Creatinine: 0.8 (ref <=1.1)
Glucose: 111
Potassium: 4.2 (ref 3.4–5.3)
Sodium: 140 (ref 137–147)

## 2017-08-04 LAB — CBC AND DIFFERENTIAL
HCT: 35 — AB (ref 36–46)
Hemoglobin: 12.2 (ref 12.0–16.0)
Platelets: 198 (ref 150–399)
WBC: 6.2

## 2017-08-04 LAB — LIPID PANEL
Cholesterol: 163 (ref 0–200)
HDL: 76 — AB (ref 35–70)
LDL Cholesterol: 68
LDl/HDL Ratio: 2.1
Triglycerides: 106 (ref 40–160)

## 2017-08-04 LAB — TSH: TSH: 3.44 (ref <=5.90)

## 2017-08-05 ENCOUNTER — Other Ambulatory Visit: Payer: Self-pay | Admitting: *Deleted

## 2017-08-05 LAB — COMPREHENSIVE METABOLIC PANEL
Calcium: 9.3
Carbon Dioxide, Total: 29
Chloride: 103
Total Protein: 5.9 g/dL

## 2017-08-08 ENCOUNTER — Non-Acute Institutional Stay: Payer: Medicare Other | Admitting: Family

## 2017-08-08 ENCOUNTER — Encounter: Payer: Self-pay | Admitting: Family

## 2017-08-08 DIAGNOSIS — R21 Rash and other nonspecific skin eruption: Secondary | ICD-10-CM

## 2017-08-08 DIAGNOSIS — K5901 Slow transit constipation: Secondary | ICD-10-CM | POA: Diagnosis not present

## 2017-08-08 NOTE — Progress Notes (Signed)
Location:  [place] Room Number: 1 Place of Service:  ALF [phone]) Provider: Dinah [name] FNP-C  Blanchie Serve, MD  Patient Care Team: Blanchie Serve, MD as PCP - General (Internal Medicine) Angelia Mould, MD as Attending Physician (Vascular Surgery) [name], MD as Attending Physician (Obstetrics and Gynecology) [name], MD (Orthopedic Surgery) [name], MD (Ophthalmology) [name], MD as Consulting Physician (Urology) [name], MD as Consulting Physician (Cardiology) [name], PA-C as Physician Assistant (Otolaryngology) [name], MD as Consulting Physician (Otolaryngology) [name], MD as Consulting Physician (Dermatology) [name], MD as Consulting Physician (Physical Medicine and Rehabilitation) [name], MD as Attending Physician (Gastroenterology) Mast, Man X, NP as Nurse Practitioner (Internal Medicine) Kathrynn Ducking, MD as Consulting Physician (Neurology) Debara Pickett Nadean Corwin, MD as Consulting Physician (Cardiology) Druscilla Brownie, MD as Consulting Physician (Dermatology)  Extended Emergency Contact Information Primary Emergency Contact: [name] of [place] Phone: [phone] Mobile Phone: [phone] Relation: Son  Code Status:  Full Code  Goals of care: Advanced Directive information Advanced Directives 08/08/2017  Does Patient Have a Medical Advance Directive? Yes  Type of Advance Directive [place]  Does patient want to make changes to medical advance directive? -  Copy of [place] in Chart? Yes  Would patient like information on creating a medical advance directive? -     Chief Complaint  Patient presents with  . Acute Visit    check red "bite" on collarbone    HPI:  Pt is a 84 y.o. female seen today at [facility] for an acute visit for evaluation of "red bite on right collarbone".she is  seen in her room today with facility Nurse supervisor present at bedside.she complains of right upper collarbone " bite" x 4 days.she describes area as red and sore to touch.she states area was swollen two days ago but now redness and swelling seems to be improving.she states looks like she was bitten by a mosquito.she denies any fever,chills,headache,nausea vomiting,diarrhea or achiness. She also complains of passing round hard stool.she has required her PRN miralax without any effect.she drank prune juice last night which helped move her bowels but had to disimpact herself.she states has poor appetite and drinks small amounts of fluids.she is aware that she needs to drink and eat veggies/fruits.   Past Medical History:  Diagnosis Date  . Abdominal bloating   . Atrial fibrillation ([place])   . Bruises easily   . Carotid artery occlusion    LEFT  . Dizziness   . DOE (dyspnea on exertion) 04/02/2014  . Fall at home Sept 2013, Dec. 2013  Jun 08, 2012  . GERD (gastroesophageal reflux disease) 02/11/2015  . Headache(784.0)   . Hoarseness   . Hypercholesterolemia   . Hyperglycemia 04/02/2014   Glucose 178 mg percent on 01/22/2014 04/05/14 Hgb A1c 6.6 Diet controlled.    . Hypothyroidism 04/15/2015  . Neuropathy    PERIPHERAL  . Pruritus   . Scoliosis   . Varicose veins    Past Surgical History:  Procedure Laterality Date  . ABDOMINAL HYSTERECTOMY  1954  . CHOLECYSTECTOMY  1997  . ESOPHAGOGASTRODUODENOSCOPY (EGD) WITH PROPOFOL N/A 11/25/2016   Procedure: ESOPHAGOGASTRODUODENOSCOPY (EGD) WITH PROPOFOL;  Surgeon: [name], MD;  Location: [facility];  Service: Endoscopy;  Laterality: N/A;  . SPINE SURGERY  1997   correct scoliosis    No Known Allergies  Outpatient Encounter Medications as of 08/08/2017  Medication  Sig  . acetaminophen (TYLENOL) 500 MG tablet Take 500 mg by mouth 2 (two) times daily.   Marland Kitchen aspirin EC 81 MG tablet Take 81 mg daily by mouth.  Marland Kitchen atenolol (TENORMIN) 25 MG  tablet Take 0.5 tablets (12.5 mg total) by mouth 2 (two) times daily.  . Biotin 5000 MCG TABS Take 5,000 mcg by mouth daily.  . Calcium-Vitamin D-Vitamin K (VIACTIV) 607-371-06 MG-UNT-MCG CHEW Chew 1 Dose by mouth daily.  . DULoxetine (CYMBALTA) 20 MG capsule TAKE 1 CAPSULE AT BEDTIME.  . fluticasone (FLONASE) 50 MCG/ACT nasal spray Place 2 sprays into both nostrils daily.  . hydrogen peroxide 1.5 % SOLN Apply 1 application topically as needed (Swish and spit for dry mouth).  Marland Kitchen ipratropium (ATROVENT) 0.06 % nasal spray Place 2 sprays into both nostrils 2 (two) times daily as needed for rhinitis.  Marland Kitchen lactose free nutrition (BOOST) LIQD Take 237 mLs by mouth 3 (three) times daily between meals.  . Menthol, Topical Analgesic, (BIOFREEZE ROLL-ON) 4 % GEL Apply 1 application topically every 8 (eight) hours. Apply to LLE  . Multiple Vitamins-Minerals (MULTIVITAL) tablet Take 1 tablet by mouth daily.  . ondansetron (ZOFRAN) 4 MG tablet Take 1 tablet (4 mg total) by mouth every 6 (six) hours as needed for nausea.  . polyethylene glycol (MIRALAX / GLYCOLAX) packet Take 17 g daily as needed by mouth (for constipation).   . simvastatin (ZOCOR) 10 MG tablet Take 10 mg by mouth daily.  . sodium phosphate (FLEET) 7-19 GM/118ML ENEM Place 1 enema rectally as needed for severe constipation.  . traMADol (ULTRAM) 50 MG tablet Take 50 mg by mouth every 8 (eight) hours as needed.  . triamcinolone cream (KENALOG) 0.1 % Apply 1 application topically as needed.  . [DISCONTINUED] simvastatin (ZOCOR) 40 MG tablet Take 20 mg at bedtime by mouth.    No facility-administered encounter medications on file as of 08/08/2017.     Review of Systems  Reason unable to perform ROS: additional information provided by facility Nurse supervisor.  Constitutional: Negative for chills, fatigue and fever.  HENT: Negative for rhinorrhea, sinus pressure, sinus pain, sneezing and sore throat.   Eyes: Positive for visual disturbance.  Negative for pain, discharge, redness and itching.       Wears eye glasses   Respiratory: Negative for cough, chest tightness, shortness of breath and wheezing.   Cardiovascular: Negative for chest pain, palpitations and leg swelling.  Gastrointestinal: Positive for constipation. Negative for abdominal distention, abdominal pain, diarrhea, nausea and vomiting.       Had BM 08/07/2017 but was  small round hard stool.   Musculoskeletal: Positive for gait problem.  Skin: Negative for color change, pallor and wound.       Rash per HPI   Neurological: Negative for dizziness, weakness, light-headedness and headaches.  Psychiatric/Behavioral: Negative for agitation, confusion and sleep disturbance. The patient is not nervous/anxious.     Immunization History  Administered Date(s) Administered  . Influenza Split 10/20/2011, 11/03/2012  . Influenza,inj,Quad PF,6+ Mos 09/29/2012, 11/09/2013  . Influenza-Unspecified 10/17/2014, 10/23/2015, 10/20/2016  . PPD Test 08/21/2013, 03/04/2014  . Pneumococcal Conjugate-13 08/21/2013  . Tdap 05/20/2008  . Zoster 11/01/2012   Pertinent  Health Maintenance Due  Topic Date Due  . DEXA SCAN  01/31/1987  . PNA vac Low Risk Adult (2 of 2 - PPSV23) 08/22/2014  . INFLUENZA VACCINE  08/11/2017   Fall Risk  09/22/2016 08/25/2016 05/31/2016 09/02/2015 04/29/2015  Falls in the past year? Yes Yes No No  No  Number falls in past yr: 1 2 or more - - -  Comment - - - - -  Injury with Fall? Yes Yes - - -  Comment fx 3 ribs - - - -  Risk Factor Category  - - - - -  Risk for fall due to : Impaired mobility;Impaired balance/gait - - - -  Risk for fall due to: Comment - - - - -   Functional Status Survey:    Vitals:   08/08/17 1224  BP: 112/64  Pulse: 98  Resp: 18  Temp: 98.9 F (37.2 C)  SpO2: 94%  Weight: 91 lb 6.4 oz (41.5 kg)  Height: 5\' 2"  (1.575 m)   Body mass index is 16.72 kg/m. Physical Exam  Constitutional: She is oriented to person, place, and  time.  Thin built,frail elderly in no acute distress   HENT:  Head: Normocephalic.  Right Ear: External ear normal.  Left Ear: External ear normal.  Mouth/Throat: Oropharynx is clear and moist. No oropharyngeal exudate.  Eyes: Pupils are equal, round, and reactive to light. Conjunctivae and EOM are normal. Right eye exhibits no discharge. Left eye exhibits no discharge. No scleral icterus.  Eye glasses in place   Neck: Normal range of motion. No thyromegaly present.  Cardiovascular: Intact distal pulses. Exam reveals no gallop and no friction rub.  Murmur heard. Pulmonary/Chest: Effort normal and breath sounds normal. No stridor. No respiratory distress. She has no wheezes. She has no rales.  Abdominal: Soft. Bowel sounds are normal. She exhibits no distension and no mass. There is no tenderness. There is no rebound and no guarding.  Musculoskeletal: Normal range of motion. She exhibits no edema or tenderness.  Unsteady gait ambulates with walker  Lymphadenopathy:    She has no cervical adenopathy.  Neurological: She is oriented to person, place, and time. Gait abnormal.  Skin: Skin is warm and dry. No pallor.  Right supraclavicular area small red-pink area slight tender to touch without any swelling or drainage.surrounding skin tissues without any signs of infections.   Psychiatric: She has a normal mood and affect. Her speech is normal and behavior is normal. Judgment and thought content normal.  Nursing note and vitals reviewed.   Labs reviewed: Recent Labs    12/02/16 0511 12/04/16 0220 12/07/16 0750 12/16/16 03/07/17 08/04/17  NA 138 135 135 137 140  140 140  K 4.2 4.2 3.9 4.8 4.9  4.9 4.2  CL 103 104 102  --   --  103  CO2 28 25 26   --   --  29  GLUCOSE 110* 89 100*  --   --   --   BUN 15 10 13 17 14 17   CREATININE 0.72 0.65 0.72 0.8 0.8  0.77 0.8  CALCIUM 8.7* 8.5* 8.9  --  9.1 9.3  MG 1.9  --  2.1  --   --   --    Recent Labs    12/04/16 0220 03/07/17 08/04/17    AST 24 22  22 24   ALT 13* 13  13 13   ALKPHOS 75 71  71 67  BILITOT 0.6 0.6  --   PROT 5.5* 5.7 5.9  ALBUMIN 2.7* 3.3  --    Recent Labs    12/01/16 1904 12/02/16 0511 12/04/16 0220  12/16/16 02/18/17 03/07/17 08/04/17  WBC 8.4 11.1* 9.9   < > 6.9 7.8  7.8 6.9 6.2  NEUTROABS 6.3 8.0*  --   --   --   --   --   --  HGB 13.8 11.5* 11.6*  --  11.8* 13.5  13.5 11.9 12.2  HCT 42.0 35.1* 34.6*  --  34* 39  38.9 34 35*  MCV 98.4 98.3 98.0  --   --   --   --   --   PLT 221 199 213  --  276 256  --  198   < > = values in this interval not displayed.   Lab Results  Component Value Date   TSH 3.44 08/04/2017   Lab Results  Component Value Date   HGBA1C 6.4 07/07/2015   Lab Results  Component Value Date   CHOL 163 08/04/2017   HDL 76 (A) 08/04/2017   LDLCALC 68 08/04/2017   TRIG 106 08/04/2017   CHOLHDL 2.1 07/29/2016    Significant Diagnostic Results in last 30 days:  No results found.  Assessment/Plan 1. Rash, skin Afebrile.Right supraclavicular area small red-pink area slight tender to touch without any swelling or drainage.surrounding skin tissues without any signs of infections.unclear etiology but suspect possible insect bite.apply hydrocortisone 0.5% cream to affected area twice daily x 7 days then discontinue.continue to monitor area for signs and symptoms of infections.notify provider if worsening or not resolve.   2. Slow transit constipation Last BM 08/07/2017 but reports small round hard stool that required manual disimpaction.continue on Miralax 17 gm powder daily as needed.Add Senna-docusate  8.6-50 mg tablet take two tablets daily at bedtime.continue to encourage oral and fluid intake.  Family/ staff Communication: Reviewed plan of care with patient and facility Nurse supervisor  Labs/tests ordered: None   Sandrea Hughs, NP

## 2017-08-24 ENCOUNTER — Encounter: Payer: Self-pay | Admitting: Internal Medicine

## 2017-09-05 ENCOUNTER — Non-Acute Institutional Stay: Payer: Medicare Other

## 2017-09-05 DIAGNOSIS — Z0000 Encounter for general adult medical examination without abnormal findings: Secondary | ICD-10-CM | POA: Diagnosis not present

## 2017-09-05 DIAGNOSIS — I5022 Chronic systolic (congestive) heart failure: Secondary | ICD-10-CM | POA: Diagnosis not present

## 2017-09-05 DIAGNOSIS — R41841 Cognitive communication deficit: Secondary | ICD-10-CM | POA: Diagnosis not present

## 2017-09-05 DIAGNOSIS — I240 Acute coronary thrombosis not resulting in myocardial infarction: Secondary | ICD-10-CM | POA: Diagnosis not present

## 2017-09-05 NOTE — Patient Instructions (Signed)
Vanessa [name] , Thank you for taking time to come for your Medicare Wellness Visit. I appreciate your ongoing commitment to your health goals. Please review the following plan we discussed and let me know if I can assist you in the future.   Screening recommendations/referrals: Colonoscopy excluded, over age 82 Mammogram excluded, over age 14 Bone Density up to date Recommended yearly ophthalmology/optometry visit for glaucoma screening and checkup Recommended yearly dental visit for hygiene and checkup  Vaccinations: Influenza vaccine due, will [facility] at [facility] Pneumococcal vaccine up to date, completed Tdap vaccine up to date Shingles vaccine not in past records    Advanced directives: in chart  Conditions/risks identified: none  Next appointment: Dr. Bubba Camp makes rounds   Preventive Care 85 Years and Older, Female Preventive care refers to lifestyle choices and visits with your health care provider that can promote health and wellness. What does preventive care include?  A yearly physical exam. This is also called an annual well check.  Dental exams once or twice a year.  Routine eye exams. Ask your health care provider how often you should have your eyes checked.  Personal lifestyle choices, including:  Daily care of your teeth and gums.  Regular physical activity.  Eating a healthy diet.  Avoiding tobacco and drug use.  Limiting alcohol use.  Practicing safe sex.  Taking low-dose aspirin every day.  Taking vitamin and mineral supplements as recommended by your health care provider. What happens during an annual well check? The services and screenings done by your health care provider during your annual well check will depend on your age, overall health, lifestyle risk factors, and family history of disease. Counseling  Your health care provider may ask you questions about your:  Alcohol use.  Tobacco use.  Drug use.  Emotional well-being.  Home and  relationship well-being.  Sexual activity.  Eating habits.  History of falls.  Memory and ability to understand (cognition).  Work and work Statistician.  Reproductive health. Screening  You may have the following tests or measurements:  Height, weight, and BMI.  Blood pressure.  Lipid and cholesterol levels. These may be checked every 5 years, or more frequently if you are over 97 years old.  Skin check.  Lung cancer screening. You may have this screening every year starting at age 50 if you have a 30-pack-year history of smoking and currently smoke or have quit within the past 15 years.  Fecal occult blood test (FOBT) of the stool. You may have this test every year starting at age 15.  Flexible sigmoidoscopy or colonoscopy. You may have a sigmoidoscopy every 5 years or a colonoscopy every 10 years starting at age 81.  Hepatitis C blood test.  Hepatitis B blood test.  Sexually transmitted disease (STD) testing.  Diabetes screening. This is done by checking your blood sugar (glucose) after you have not eaten for a while (fasting). You may have this done every 1-3 years.  Bone density scan. This is done to screen for osteoporosis. You may have this done starting at age 44.  Mammogram. This may be done every 1-2 years. Talk to your health care provider about how often you should have regular mammograms. Talk with your health care provider about your test results, treatment options, and if necessary, the need for more tests. Vaccines  Your health care provider may recommend certain vaccines, such as:  Influenza vaccine. This is recommended every year.  Tetanus, diphtheria, and acellular pertussis (Tdap, Td) vaccine. You may  need a Td booster every 10 years.  Zoster vaccine. You may need this after age 93.  Pneumococcal 13-valent conjugate (PCV13) vaccine. One dose is recommended after age 18.  Pneumococcal polysaccharide (PPSV23) vaccine. One dose is recommended after  age 59. Talk to your health care provider about which screenings and vaccines you need and how often you need them. This information is not intended to replace advice given to you by your health care provider. Make sure you discuss any questions you have with your health care provider. Document Released: 01/24/2015 Document Revised: 09/17/2015 Document Reviewed: 10/29/2014 Elsevier Interactive Patient Education  2017 [place] Prevention in the Home Falls can cause injuries. They can happen to people of all ages. There are many things you can do to make your home safe and to help prevent falls. What can I do on the outside of my home?  Regularly fix the edges of walkways and driveways and fix any cracks.  Remove anything that might make you trip as you walk through a door, such as a raised step or threshold.  Trim any bushes or trees on the path to your home.  Use bright outdoor lighting.  Clear any walking paths of anything that might make someone trip, such as rocks or tools.  Regularly check to see if handrails are loose or broken. Make sure that both sides of any steps have handrails.  Any raised decks and porches should have guardrails on the edges.  Have any leaves, snow, or ice cleared regularly.  Use sand or salt on walking paths during winter.  Clean up any spills in your garage right away. This includes oil or grease spills. What can I do in the bathroom?  Use night lights.  Install grab bars by the toilet and in the tub and shower. Do not use towel bars as grab bars.  Use non-skid mats or decals in the tub or shower.  If you need to sit down in the shower, use a plastic, non-slip stool.  Keep the floor dry. Clean up any water that spills on the floor as soon as it happens.  Remove soap buildup in the tub or shower regularly.  Attach bath mats securely with double-sided non-slip rug tape.  Do not have throw rugs and other things on the floor that can  make you trip. What can I do in the bedroom?  Use night lights.  Make sure that you have a light by your bed that is easy to reach.  Do not use any sheets or blankets that are too big for your bed. They should not hang down onto the floor.  Have a firm chair that has side arms. You can use this for support while you get dressed.  Do not have throw rugs and other things on the floor that can make you trip. What can I do in the kitchen?  Clean up any spills right away.  Avoid walking on wet floors.  Keep items that you use a lot in easy-to-reach places.  If you need to reach something above you, use a strong step stool that has a grab bar.  Keep electrical cords out of the way.  Do not use floor polish or wax that makes floors slippery. If you must use wax, use non-skid floor wax.  Do not have throw rugs and other things on the floor that can make you trip. What can I do with my stairs?  Do not leave any items on  the stairs.  Make sure that there are handrails on both sides of the stairs and use them. Fix handrails that are broken or loose. Make sure that handrails are as long as the stairways.  Check any carpeting to make sure that it is firmly attached to the stairs. Fix any carpet that is loose or worn.  Avoid having throw rugs at the top or bottom of the stairs. If you do have throw rugs, attach them to the floor with carpet tape.  Make sure that you have a light switch at the top of the stairs and the bottom of the stairs. If you do not have them, ask someone to add them for you. What else can I do to help prevent falls?  Wear shoes that:  Do not have high heels.  Have rubber bottoms.  Are comfortable and fit you well.  Are closed at the toe. Do not wear sandals.  If you use a stepladder:  Make sure that it is fully opened. Do not climb a closed stepladder.  Make sure that both sides of the stepladder are locked into place.  Ask someone to hold it for you,  if possible.  Clearly mark and make sure that you can see:  Any grab bars or handrails.  First and last steps.  Where the edge of each step is.  Use tools that help you move around (mobility aids) if they are needed. These include:  Canes.  Walkers.  Scooters.  Crutches.  Turn on the lights when you go into a dark area. Replace any light bulbs as soon as they burn out.  Set up your furniture so you have a clear path. Avoid moving your furniture around.  If any of your floors are uneven, fix them.  If there are any pets around you, be aware of where they are.  Review your medicines with your doctor. Some medicines can make you feel dizzy. This can increase your chance of falling. Ask your doctor what other things that you can do to help prevent falls. This information is not intended to replace advice given to you by your health care provider. Make sure you discuss any questions you have with your health care provider. Document Released: 10/24/2008 Document Revised: 06/05/2015 Document Reviewed: 02/01/2014 Elsevier Interactive Patient Education  2017 [employer].

## 2017-09-05 NOTE — Progress Notes (Signed)
Subjective:   Vanessa [name] is a 84 y.o. female who presents for Medicare Annual (Subsequent) preventive examination at [place] [facility]-08/25/2016    Objective:     Vitals: BP 123/84 (BP Location: Left Arm, Patient Position: Sitting)   Pulse 87   Temp 98.8 F (37.1 C) (Oral)   Ht 5\' 2"  (1.575 m)   Wt 91 lb (41.3 kg)   BMI 16.64 kg/m   Body mass index is 16.64 kg/m.  Advanced Directives 09/05/2017 08/08/2017 08/01/2017 05/05/2017 05/03/2017 03/18/2017 02/25/2017  Does Patient Have a Medical Advance Directive? Yes Yes Yes Yes Yes Yes Yes  Type of [place] administrator Power of [place] of [place];Living will [place];Living will [place];Living will  Does patient want to make changes to medical advance directive? No - Patient declined - No - Patient declined - - No - Patient declined No - Patient declined  Copy of [place] in Chart? Yes Yes Yes Yes No - copy requested Yes Yes  Would patient like information on creating a medical advance directive? - - - - - - -    Tobacco Social History   Tobacco Use  Smoking Status Never Smoker  Smokeless Tobacco Never Used     Counseling given: Not Answered   Clinical Intake:  Pre-visit preparation completed: No  Pain : 0-10 Pain Score: 8  Pain Type: Chronic pain Pain Location: Back Pain Descriptors / Indicators: Aching Pain Onset: More than a month ago Pain Frequency: Intermittent     Nutritional Risks: None Diabetes: No  How often do you need to have someone help you when you read instructions, pamphlets, or other written materials from your doctor or pharmacy?: 1 - Never What is the last grade level you completed in school?: college  Interpreter Needed?: No  Information entered by :: Tyson Dense, RN  Past Medical History:    Diagnosis Date  . Abdominal bloating   . Atrial fibrillation ([place])   . Bruises easily   . Carotid artery occlusion    LEFT  . Dizziness   . DOE (dyspnea on exertion) 04/02/2014  . Fall at home Sept 2013, Dec. 2013  Jun 08, 2012  . GERD (gastroesophageal reflux disease) 02/11/2015  . Headache(784.0)   . Hoarseness   . Hypercholesterolemia   . Hyperglycemia 04/02/2014   Glucose 178 mg percent on 01/22/2014 04/05/14 Hgb A1c 6.6 Diet controlled.    . Hypothyroidism 04/15/2015  . Neuropathy    PERIPHERAL  . Pruritus   . Scoliosis   . Varicose veins    Past Surgical History:  Procedure Laterality Date  . ABDOMINAL HYSTERECTOMY  1954  . CHOLECYSTECTOMY  1997  . ESOPHAGOGASTRODUODENOSCOPY (EGD) WITH PROPOFOL N/A 11/25/2016   Procedure: ESOPHAGOGASTRODUODENOSCOPY (EGD) WITH PROPOFOL;  Surgeon: [name], MD;  Location: [facility];  Service: Endoscopy;  Laterality: N/A;  . [place]   correct scoliosis   Family History  Adopted: Yes  Problem Relation Age of Onset  . Diabetes Mother   . Heart attack Mother   . Heart disease Mother        After age 52  . Heart attack Father   . Stroke Father   . Breast cancer Sister   . Heart disease Maternal Grandmother   . Cancer Son        adopted son. esophagus,  stomach, liver  . Stomach cancer Neg Hx   . Colon cancer Neg Hx    Social History   Socioeconomic History  . Marital status: Widowed    Spouse name: Not on file  . Number of children: 2  . Years of education: 12+  . Highest education level: Not on file  Occupational History  . 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 Financial resource strain: Not hard at all  . Food insecurity:    Worry: Never true    Inability: Never true  . Transportation needs:    Medical: No    Non-medical: No  Tobacco Use  . Smoking status: Never Smoker  . Smokeless tobacco: Never Used  Substance and Sexual Activity  .  Alcohol use: No    Alcohol/week: 0.0 standard drinks  . Drug use: No  . Sexual activity: Never    Birth control/protection: None  Lifestyle  . Physical activity:    Days per week: 0 days    Minutes per session: 0 min  . Stress: Only a little  Relationships  . Social connections:    Talks on phone: More than three times a week    Gets together: More than three times a week    Attends religious service: Never    Active member of club or organization: No    Attends meetings of clubs or organizations: Never    Relationship status: Widowed  Other Topics Concern  . Not on file  Social History Narrative   Patient lives at [facility] 03/05/2014   Patient is a widowed.    Patient is retired.    Patient has no children but adopted twin sons.    Patient has a college degree.    Never smoked   Alcohol none   Exercise with therapy   Walks with cane   POA       Patient only drinks de-caf drinks.   Patient is right handed.    Outpatient Encounter Medications as of 09/05/2017  Medication Sig  . acetaminophen (TYLENOL) 500 MG tablet Take 500 mg by mouth 2 (two) times daily.   Marland Kitchen aspirin EC 81 MG tablet Take 81 mg daily by mouth.  Marland Kitchen atenolol (TENORMIN) 25 MG tablet Take 0.5 tablets (12.5 mg total) by mouth 2 (two) times daily.  . Biotin 5000 MCG TABS Take 5,000 mcg by mouth daily.  . Calcium-Vitamin D-Vitamin K (VIACTIV) 627-035-00 MG-UNT-MCG CHEW Chew 1 Dose by mouth daily.  . DULoxetine (CYMBALTA) 20 MG capsule TAKE 1 CAPSULE AT BEDTIME.  . fluticasone (FLONASE) 50 MCG/ACT nasal spray Place 2 sprays into both nostrils daily.  . hydrogen peroxide 1.5 % SOLN Apply 1 application topically as needed (Swish and spit for dry mouth).  Marland Kitchen ipratropium (ATROVENT) 0.06 % nasal spray Place 2 sprays into both nostrils 2 (two) times daily as needed for rhinitis.  Marland Kitchen lactose free nutrition (BOOST) LIQD Take 237 mLs by mouth 3 (three) times daily between meals.  . Menthol, Topical Analgesic,  (BIOFREEZE ROLL-ON) 4 % GEL Apply 1 application topically every 8 (eight) hours. Apply to LLE  . Multiple Vitamins-Minerals (MULTIVITAL) tablet Take 1 tablet by mouth daily.  . ondansetron (ZOFRAN) 4 MG tablet Take 1 tablet (4 mg total) by mouth every 6 (six) hours as needed for nausea.  . polyethylene glycol (MIRALAX / GLYCOLAX) packet Take 17 g daily as needed by mouth (for constipation).   . simvastatin (ZOCOR) 10 MG  tablet Take 10 mg by mouth daily.  . sodium phosphate (FLEET) 7-19 GM/118ML ENEM Place 1 enema rectally as needed for severe constipation.  . traMADol (ULTRAM) 50 MG tablet Take 50 mg by mouth every 8 (eight) hours as needed.  . triamcinolone cream (KENALOG) 0.1 % Apply 1 application topically as needed.   No facility-administered encounter medications on file as of 09/05/2017.     Activities of Daily Living In your present state of health, do you have any difficulty performing the following activities: 09/05/2017 12/02/2016  Hearing? N N  Vision? N N  Difficulty concentrating or making decisions? N N  Walking or climbing stairs? Y Y  Dressing or bathing? N Y  Doing errands, shopping? Y N  Preparing Food and eating ? Y -  Using the Toilet? N -  In the past six months, have you accidently leaked urine? N -  Do you have problems with loss of bowel control? N -  Managing your Medications? Y -  Managing your Finances? Y -  Housekeeping or managing your Housekeeping? Y -  Some recent data might be hidden    Patient Care Team: Blanchie Serve, MD as PCP - General (Internal Medicine) Angelia Mould, MD as Attending Physician (Vascular Surgery) [name], MD as Attending Physician (Obstetrics and Gynecology) [name], MD (Orthopedic Surgery) [name], MD (Ophthalmology) [name], MD as Consulting Physician (Urology) [name], MD as Consulting Physician (Cardiology) [name], PA-C as Physician Assistant (Otolaryngology) [name],  MD as Consulting Physician (Otolaryngology) [name], MD as Consulting Physician (Dermatology) [name], MD as Consulting Physician (Physical Medicine and Rehabilitation) [name], MD as Attending Physician (Gastroenterology) Mast, Man X, NP as Nurse Practitioner (Internal Medicine) Kathrynn Ducking, MD as Consulting Physician (Neurology) Debara Pickett Nadean Corwin, MD as Consulting Physician (Cardiology) Druscilla Brownie, MD as Consulting Physician (Dermatology)    Assessment:   This is a routine wellness examination for Vanessa [name].  Exercise Activities and Dietary recommendations Current Exercise Habits: The patient does not participate in regular exercise at present, Exercise limited by: orthopedic condition(s)  Goals   None     Fall Risk Fall Risk  09/05/2017 09/22/2016 08/25/2016 05/31/2016 09/02/2015  Falls in the past year? Yes Yes Yes No No  Number falls in past yr: 2 or more 1 2 or more - -  Comment - - - - -  Injury with Fall? Yes Yes Yes - -  Comment - fx 3 ribs - - -  Risk Factor Category  - - - - -  Risk for fall due to : - Impaired mobility;Impaired balance/gait - - -  Risk for fall due to: Comment - - - - -   Is the patient's home free of loose throw rugs in walkways, pet beds, electrical cords, etc?   yes      Grab bars in the bathroom? yes      Handrails on the stairs?   yes      Adequate lighting?   yes  Timed Get Up and Go performed: 24 seconds  Depression Screen PHQ 2/9 Scores 09/05/2017 08/25/2016 05/31/2016 09/02/2015  PHQ - 2 Score 0 0 0 3  PHQ- 9 Score - - - -     Cognitive Function MMSE - Mini Mental State Exam 09/05/2017 08/25/2016 09/02/2015  Orientation to time 4 5 5   Orientation to Place 5 5 5   Registration 3 3 3   Attention/ Calculation 5 5 5   Recall 3 2 2  Language- name 2 objects 2 2 2   Language- repeat 1 1 1   Language- follow 3 step command 3 3 3   Language- read & follow direction 1 1 1   Write a sentence 1 1 1   Copy design 1 1 1     Total score 29 29 29         Immunization History  Administered Date(s) Administered  . Influenza Split 10/20/2011, 11/03/2012  . Influenza,inj,Quad PF,6+ Mos 09/29/2012, 11/09/2013  . Influenza-Unspecified 10/17/2014, 10/23/2015, 10/20/2016  . PPD Test 08/21/2013, 03/04/2014  . Pneumococcal Conjugate-13 08/21/2013  . Pneumococcal Polysaccharide-23 09/08/2016  . Tdap 05/20/2008  . Zoster 11/01/2012    Qualifies for Shingles Vaccine? Not in past records  Screening Tests Health Maintenance  Topic Date Due  . DEXA SCAN  01/31/1987  . INFLUENZA VACCINE  08/11/2017  . TETANUS/TDAP  05/21/2018  . PNA vac Low Risk Adult  Completed    Cancer Screenings: Lung: Low Dose CT Chest recommended if Age 5-80 years, 30 pack-year currently smoking OR have quit w/in 15years. Patient does not qualify. Breast:  Up to date on Mammogram? Yes   Up to date of Bone Density/Dexa? No, ordered Colorectal: up to date  Additional Screenings:  Hepatitis C Screening: declined Flu vaccine due: will get at [place]:    I have personally reviewed and addressed the Medicare Annual Wellness questionnaire and have noted the following in the patient's chart:  A. Medical and social history B. Use of alcohol, tobacco or illicit drugs  C. Current medications and supplements D. Functional ability and status E.  Nutritional status F.  Physical activity G. Advance directives H. List of other physicians I.  Hospitalizations, surgeries, and ER visits in previous 12 months J.  [place] to include hearing, vision, cognitive, depression L. Referrals and appointments - none  In addition, I have reviewed and discussed with patient certain preventive protocols, quality metrics, and best practice recommendations. A written personalized care plan for preventive services as well as general preventive health recommendations were provided to patient.  See attached scanned questionnaire for additional  information.   Signed,   Tyson Dense, RN Nurse Health Advisor  Patient concerns: None

## 2017-09-06 DIAGNOSIS — R41841 Cognitive communication deficit: Secondary | ICD-10-CM | POA: Diagnosis not present

## 2017-09-06 DIAGNOSIS — I5022 Chronic systolic (congestive) heart failure: Secondary | ICD-10-CM | POA: Diagnosis not present

## 2017-09-06 DIAGNOSIS — I240 Acute coronary thrombosis not resulting in myocardial infarction: Secondary | ICD-10-CM | POA: Diagnosis not present

## 2017-09-07 DIAGNOSIS — I5022 Chronic systolic (congestive) heart failure: Secondary | ICD-10-CM | POA: Diagnosis not present

## 2017-09-07 DIAGNOSIS — R41841 Cognitive communication deficit: Secondary | ICD-10-CM | POA: Diagnosis not present

## 2017-09-07 DIAGNOSIS — I240 Acute coronary thrombosis not resulting in myocardial infarction: Secondary | ICD-10-CM | POA: Diagnosis not present

## 2017-09-12 DIAGNOSIS — I240 Acute coronary thrombosis not resulting in myocardial infarction: Secondary | ICD-10-CM | POA: Diagnosis not present

## 2017-09-12 DIAGNOSIS — R41841 Cognitive communication deficit: Secondary | ICD-10-CM | POA: Diagnosis not present

## 2017-09-12 DIAGNOSIS — I5022 Chronic systolic (congestive) heart failure: Secondary | ICD-10-CM | POA: Diagnosis not present

## 2017-09-15 DIAGNOSIS — I240 Acute coronary thrombosis not resulting in myocardial infarction: Secondary | ICD-10-CM | POA: Diagnosis not present

## 2017-09-15 DIAGNOSIS — R41841 Cognitive communication deficit: Secondary | ICD-10-CM | POA: Diagnosis not present

## 2017-09-15 DIAGNOSIS — I5022 Chronic systolic (congestive) heart failure: Secondary | ICD-10-CM | POA: Diagnosis not present

## 2017-09-19 DIAGNOSIS — I240 Acute coronary thrombosis not resulting in myocardial infarction: Secondary | ICD-10-CM | POA: Diagnosis not present

## 2017-09-19 DIAGNOSIS — R41841 Cognitive communication deficit: Secondary | ICD-10-CM | POA: Diagnosis not present

## 2017-09-19 DIAGNOSIS — I5022 Chronic systolic (congestive) heart failure: Secondary | ICD-10-CM | POA: Diagnosis not present

## 2017-09-20 ENCOUNTER — Encounter: Payer: Self-pay | Admitting: Family

## 2017-09-21 DIAGNOSIS — I5022 Chronic systolic (congestive) heart failure: Secondary | ICD-10-CM | POA: Diagnosis not present

## 2017-09-21 DIAGNOSIS — R41841 Cognitive communication deficit: Secondary | ICD-10-CM | POA: Diagnosis not present

## 2017-09-21 DIAGNOSIS — I240 Acute coronary thrombosis not resulting in myocardial infarction: Secondary | ICD-10-CM | POA: Diagnosis not present

## 2017-09-26 DIAGNOSIS — I240 Acute coronary thrombosis not resulting in myocardial infarction: Secondary | ICD-10-CM | POA: Diagnosis not present

## 2017-09-26 DIAGNOSIS — I5022 Chronic systolic (congestive) heart failure: Secondary | ICD-10-CM | POA: Diagnosis not present

## 2017-09-26 DIAGNOSIS — R41841 Cognitive communication deficit: Secondary | ICD-10-CM | POA: Diagnosis not present

## 2017-09-29 DIAGNOSIS — I5022 Chronic systolic (congestive) heart failure: Secondary | ICD-10-CM | POA: Diagnosis not present

## 2017-09-29 DIAGNOSIS — R41841 Cognitive communication deficit: Secondary | ICD-10-CM | POA: Diagnosis not present

## 2017-09-29 DIAGNOSIS — I240 Acute coronary thrombosis not resulting in myocardial infarction: Secondary | ICD-10-CM | POA: Diagnosis not present

## 2017-10-03 ENCOUNTER — Ambulatory Visit: Payer: Medicare Other | Admitting: Family

## 2017-10-03 ENCOUNTER — Ambulatory Visit (HOSPITAL_COMMUNITY): Payer: Medicare Other

## 2017-10-03 DIAGNOSIS — M533 Sacrococcygeal disorders, not elsewhere classified: Secondary | ICD-10-CM | POA: Diagnosis not present

## 2017-10-05 DIAGNOSIS — I5022 Chronic systolic (congestive) heart failure: Secondary | ICD-10-CM | POA: Diagnosis not present

## 2017-10-05 DIAGNOSIS — R41841 Cognitive communication deficit: Secondary | ICD-10-CM | POA: Diagnosis not present

## 2017-10-05 DIAGNOSIS — I240 Acute coronary thrombosis not resulting in myocardial infarction: Secondary | ICD-10-CM | POA: Diagnosis not present

## 2017-10-12 ENCOUNTER — Ambulatory Visit: Payer: Medicare Other | Admitting: Adult Health

## 2017-10-12 DIAGNOSIS — R0982 Postnasal drip: Secondary | ICD-10-CM | POA: Diagnosis not present

## 2017-10-12 DIAGNOSIS — J310 Chronic rhinitis: Secondary | ICD-10-CM | POA: Diagnosis not present

## 2017-10-12 DIAGNOSIS — M533 Sacrococcygeal disorders, not elsewhere classified: Secondary | ICD-10-CM | POA: Diagnosis not present

## 2017-10-14 DIAGNOSIS — R41841 Cognitive communication deficit: Secondary | ICD-10-CM | POA: Diagnosis not present

## 2017-10-14 DIAGNOSIS — I240 Acute coronary thrombosis not resulting in myocardial infarction: Secondary | ICD-10-CM | POA: Diagnosis not present

## 2017-10-14 DIAGNOSIS — I5022 Chronic systolic (congestive) heart failure: Secondary | ICD-10-CM | POA: Diagnosis not present

## 2017-10-18 DIAGNOSIS — I240 Acute coronary thrombosis not resulting in myocardial infarction: Secondary | ICD-10-CM | POA: Diagnosis not present

## 2017-10-18 DIAGNOSIS — I5022 Chronic systolic (congestive) heart failure: Secondary | ICD-10-CM | POA: Diagnosis not present

## 2017-10-18 DIAGNOSIS — R41841 Cognitive communication deficit: Secondary | ICD-10-CM | POA: Diagnosis not present

## 2017-10-19 ENCOUNTER — Telehealth: Payer: Self-pay | Admitting: Neurology

## 2017-10-19 NOTE — Telephone Encounter (Signed)
Not sure if this is r/t her neuropathy or not? She can either wait for an appointment with me or see PCP

## 2017-10-19 NOTE — Telephone Encounter (Signed)
I spoke to pt.  She is having continued neuropathy pain, also with the pain in leg near ankle, swelling. She mentions that she has asked persons at [facility] ? Bug bites.  She does not recall about dermatology appt.  She states she is seeing so many MD that she cannot recall.  She cancelled 10/2 appt with [place] due to having too many appts that day.  She is asking for another one now.

## 2017-10-19 NOTE — Telephone Encounter (Signed)
I called the patient.  The patient complains of some left leg pain, she indicates that she does have some swelling in the ankles, this is present bilaterally but is more on the left than the right.  She has some pain below the knee on the left, when she was seen here in March 2019, she was complaining of left leg pain at that time as well.  The pain may be bothersome at night when she was trying to sleep.  I will try to get a visit set up for the patient to reevaluate the leg pain.

## 2017-10-19 NOTE — Telephone Encounter (Signed)
Pt called she is experiencing intermittent pain in the left leg near the ankle, sensitive. It does not prevent her from walking. She said a small area of the leg swells, sensitive to the touch. She said the pain awakens her during the night. She is at [facility]. Please call to advise of an appt or if she should follow up with PCP.

## 2017-10-19 NOTE — Telephone Encounter (Signed)
I spoke to pt again.  I relayed that I can make appt with MM/NP to evaluate her neuropathy?  some area around ankle. This was worked up from previous ofv note by pcp.  Even though, she stated would like to see Dr. Jannifer Franklin, who her MDs have relayed to her  , Dr. Benjamine Mola, Dr. Bubba Camp (who has left [facility]) now to see.  I would have to send message to Dr. Jannifer Franklin and she was appreciative.

## 2017-10-20 DIAGNOSIS — I5022 Chronic systolic (congestive) heart failure: Secondary | ICD-10-CM | POA: Diagnosis not present

## 2017-10-20 DIAGNOSIS — I240 Acute coronary thrombosis not resulting in myocardial infarction: Secondary | ICD-10-CM | POA: Diagnosis not present

## 2017-10-20 DIAGNOSIS — R41841 Cognitive communication deficit: Secondary | ICD-10-CM | POA: Diagnosis not present

## 2017-10-20 NOTE — Telephone Encounter (Signed)
I called patient and offered her a 7:30a spot with Dr. Jannifer Franklin but that time is too early for her because she has to schedule the transportation bus. I scheduled her for Oct 25 at 12p.

## 2017-10-24 DIAGNOSIS — M48061 Spinal stenosis, lumbar region without neurogenic claudication: Secondary | ICD-10-CM | POA: Diagnosis not present

## 2017-10-25 DIAGNOSIS — I240 Acute coronary thrombosis not resulting in myocardial infarction: Secondary | ICD-10-CM | POA: Diagnosis not present

## 2017-10-25 DIAGNOSIS — I5022 Chronic systolic (congestive) heart failure: Secondary | ICD-10-CM | POA: Diagnosis not present

## 2017-10-25 DIAGNOSIS — R41841 Cognitive communication deficit: Secondary | ICD-10-CM | POA: Diagnosis not present

## 2017-10-26 ENCOUNTER — Non-Acute Institutional Stay: Payer: Medicare Other | Admitting: Family

## 2017-10-26 ENCOUNTER — Encounter: Payer: Self-pay | Admitting: Family

## 2017-10-26 DIAGNOSIS — Y92129 Unspecified place in nursing home as the place of occurrence of the external cause: Secondary | ICD-10-CM | POA: Diagnosis not present

## 2017-10-26 DIAGNOSIS — R2681 Unsteadiness on feet: Secondary | ICD-10-CM

## 2017-10-26 DIAGNOSIS — W19XXXA Unspecified fall, initial encounter: Secondary | ICD-10-CM
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
## 2017-10-27 DIAGNOSIS — I5022 Chronic systolic (congestive) heart failure: Secondary | ICD-10-CM | POA: Diagnosis not present

## 2017-10-27 DIAGNOSIS — I240 Acute coronary thrombosis not resulting in myocardial infarction: Secondary | ICD-10-CM | POA: Diagnosis not present

## 2017-10-27 DIAGNOSIS — R41841 Cognitive communication deficit: Secondary | ICD-10-CM | POA: Diagnosis not present

## 2017-11-03 DIAGNOSIS — E785 Hyperlipidemia, unspecified: Secondary | ICD-10-CM | POA: Diagnosis not present

## 2017-11-03 LAB — LIPID PANEL
Cholesterol: 187 (ref 0–200)
HDL: 84 — AB (ref 35–70)
LDL Cholesterol: 80
Triglycerides: 136 (ref 40–160)

## 2017-11-04 ENCOUNTER — Ambulatory Visit (INDEPENDENT_AMBULATORY_CARE_PROVIDER_SITE_OTHER): Payer: Medicare Other | Admitting: Neurology

## 2017-11-04 ENCOUNTER — Encounter: Payer: Self-pay | Admitting: Neurology

## 2017-11-04 VITALS — BP 110/60 | HR 79 | Ht 62.0 in | Wt 96.5 lb

## 2017-11-04 DIAGNOSIS — G629 Polyneuropathy, unspecified: Secondary | ICD-10-CM

## 2017-11-04 DIAGNOSIS — I6521 Occlusion and stenosis of right carotid artery: Secondary | ICD-10-CM

## 2017-11-04 MED ORDER — LIDOCAINE-PRILOCAINE 2.5-2.5 % EX CREA: 1.0000 "application " | TOPICAL_CREAM | CUTANEOUS | 1 refills | Status: DC | PRN

## 2017-11-04 MED ORDER — PREGABALIN 25 MG PO CAPS: 25.0000 mg | ORAL_CAPSULE | Freq: Two times a day (BID) | ORAL | 3 refills | Status: DC

## 2017-11-04 NOTE — Patient Instructions (Signed)
We will start Lyrica for the neuropathy pain, and stop the Cymbalta.  Use EMLA creame if needed for the left leg pain.  Lyrica (pregabalin) may cause drowsiness, gait instability, ankle swelling, or cognitive slowing as well as possible dizziness. If any significant side effects occur associated with this medication, please contact our office.

## 2017-11-04 NOTE — Progress Notes (Signed)
Reason for visit: Peripheral neuropathy  Vanessa [name] is an 84 y.o. female  History of present illness:  Vanessa [name] is a 84 year old right-handed white female with a history of a peripheral neuropathy associate with numbness in the feet and tingling sensations that are worse in the evening hours.  Towards the beginning of the year of 2019, the patient had what looked like a bug bite on the left pretibial area, this has resulted in an area of tenderness and pain that will come and go over time.  The patient has had arterial Dopplers that were negative.  The patient is sensitive to touch on the pretibial area just above the ankle.  She is on Cymbalta taking 20 mg daily which does not completely control her symptoms.  The patient is also being treated for right SI joint dysfunction with shots intermittently.  The patient uses a walker for ambulation, she has fallen on occasion, the last significant fall was on 05 February 2017.  The patient may use topical ointments on the leg at times for the pain.  She returns to this office for an evaluation.  Past Medical History:  Diagnosis Date  . Abdominal bloating   . Atrial fibrillation ([place])   . Bruises easily   . Carotid artery occlusion    LEFT  . Dizziness   . DOE (dyspnea on exertion) 04/02/2014  . Fall at home Sept 2013, Dec. 2013  Jun 08, 2012  . GERD (gastroesophageal reflux disease) 02/11/2015  . Headache(784.0)   . Hoarseness   . Hypercholesterolemia   . Hyperglycemia 04/02/2014   Glucose 178 mg percent on 01/22/2014 04/05/14 Hgb A1c 6.6 Diet controlled.    . Hypothyroidism 04/15/2015  . Neuropathy    PERIPHERAL  . Pruritus   . Scoliosis   . Varicose veins     Past Surgical History:  Procedure Laterality Date  . ABDOMINAL HYSTERECTOMY  1954  . CHOLECYSTECTOMY  1997  . ESOPHAGOGASTRODUODENOSCOPY (EGD) WITH PROPOFOL N/A 11/25/2016   Procedure: ESOPHAGOGASTRODUODENOSCOPY (EGD) WITH PROPOFOL;  Surgeon: [name], MD;   Location: [facility];  Service: Endoscopy;  Laterality: N/A;  . [place]   correct scoliosis    Family History  Adopted: Yes  Problem Relation Age of Onset  . Diabetes Mother   . Heart attack Mother   . Heart disease Mother        After age 74  . Heart attack Father   . Stroke Father   . Breast cancer Sister   . Heart disease Maternal Grandmother   . Cancer Son        adopted son. esophagus, stomach, liver  . Stomach cancer Neg Hx   . Colon cancer Neg Hx     Social history:  reports that she has never smoked. She has never used smokeless tobacco. She reports that she does not drink alcohol or use drugs.   No Known Allergies  Medications:  Prior to Admission medications   Medication Sig Start Date End Date Taking? Authorizing Provider  acetaminophen (TYLENOL) 500 MG tablet Take 500 mg by mouth 2 (two) times daily.    Yes [provider]  aspirin 81 MG chewable tablet Chew 81 mg by mouth daily.   Yes [provider]  atenolol (TENORMIN) 25 MG tablet Take 12.5 mg by mouth 2 (two) times daily. Hold if SBP < 100 or HR < 60   Yes [provider]  Biotin 5000 MCG TABS Take 5,000  mcg by mouth daily.   Yes [provider]  bisacodyl (DULCOLAX) 5 MG EC tablet Take 5 mg by mouth at bedtime.   Yes [provider]  Calcium-Vitamin D-Vitamin K (VIACTIV) 528-413-24 MG-UNT-MCG CHEW Chew 1 tablet by mouth daily.    Yes [provider]  fluticasone (FLONASE) 50 MCG/ACT nasal spray Place 2 sprays into both nostrils daily.   Yes [provider]  hydrogen peroxide 1.5 % SOLN Apply 1 application topically as needed (Swish and spit for dry mouth).   Yes [provider]  ipratropium (ATROVENT) 0.06 % nasal spray Place 2 sprays into both nostrils 2 (two) times daily as needed for rhinitis.   Yes [provider]  lactose free nutrition (BOOST PLUS) LIQD Take 237 mLs by mouth 3 (three) times daily with meals. Per  request of resident, states can't eat if takes it later   Yes [provider]  Menthol, Topical Analgesic, (BIOFREEZE ROLL-ON) 4 % GEL Apply 1 application topically every 8 (eight) hours. Apply to LLE   Yes [provider]  ondansetron (ZOFRAN) 4 MG tablet Take 1 tablet (4 mg total) by mouth every 6 (six) hours as needed for nausea. 12/07/16  Yes Allie Bossier, MD  polyethylene glycol [facility] / Floria Raveling) packet Take 17 g daily as needed by mouth (for constipation).    Yes [provider]  simvastatin (ZOCOR) 10 MG tablet Take 10 mg by mouth daily.   Yes [provider]  sodium phosphate (FLEET) 7-19 GM/118ML ENEM Place 1 enema rectally as needed for severe constipation. Give Fleet enema per rectum X 1 after removing hard stool or if soft stool is present in rectum.   Yes [provider]  traMADol (ULTRAM) 50 MG tablet Take 50 mg by mouth every 8 (eight) hours as needed.    Yes [provider]  triamcinolone cream (KENALOG) 0.1 % Apply 1 application topically as needed. Apply to itchy areas as needed. May keep in room and self apply   Yes [provider]  lidocaine-prilocaine (EMLA) cream Apply 1 application topically as needed. 11/04/17   Kathrynn Ducking, MD  pregabalin (LYRICA) 25 MG capsule Take 1 capsule (25 mg total) by mouth 2 (two) times daily. 11/04/17   Kathrynn Ducking, MD    ROS:  Out of a complete 14 system review of symptoms, the patient complains only of the following symptoms, and all other reviewed systems are negative.  Leg pain, numbness Walking problems  Blood pressure 110/60, pulse 79, height 5\' 2"  (1.575 m), weight 96 lb 8 oz (43.8 kg).  Physical Exam  General: The patient is alert and cooperative at the time of the examination.  Skin: No significant peripheral edema is noted.   Neurologic Exam  Mental status: The patient is alert and oriented x 3 at the time of the examination. The patient has  apparent normal recent and remote memory, with an apparently normal attention span and concentration ability.   Cranial nerves: Facial symmetry is present. Speech is normal, no aphasia or dysarthria is noted. Extraocular movements are full. Visual fields are full.  Motor: The patient has good strength in all 4 extremities.  Sensory examination: Soft touch sensation is symmetric on the face, arms, and legs.  Coordination: The patient has good finger-nose-finger and heel-to-shin bilaterally.  Gait and station: The patient has a slightly wide-based gait, the patient is able to walk fairly well with a walker.  Tandem gait was not attempted.  Romberg is negative but is unsteady.  Reflexes: Deep tendon reflexes are symmetric.   Assessment/Plan:  1.  Peripheral neuropathy  2.  Gait disturbance  3.  Left leg pain  The patient has an area on the left pretibial area that is sensitive to touch, and causes sharp pains intermittently.  The patient also has paresthesias and discomfort in the feet associated with her neuropathy.  The Cymbalta will be discontinued, the patient will be placed on Lyrica 25 mg twice daily.  She was given a prescription for EMLA cream to use topically for the left leg if the pain ensues.  She will follow-up in 6 months.   Jill Alexanders MD 11/04/2017 12:55 PM  [facility] [address] [place], [place] [address]  Phone [phone] Fax [phone]

## 2017-11-09 ENCOUNTER — Encounter: Payer: Self-pay | Admitting: Family Medicine

## 2017-11-09 NOTE — Progress Notes (Signed)
Opened in error; Disregard.

## 2017-11-14 ENCOUNTER — Non-Acute Institutional Stay: Payer: Medicare Other | Admitting: Family

## 2017-11-14 ENCOUNTER — Encounter: Payer: Self-pay | Admitting: Family

## 2017-11-14 DIAGNOSIS — I739 Peripheral vascular disease, unspecified: Secondary | ICD-10-CM | POA: Diagnosis not present

## 2017-11-14 DIAGNOSIS — I482 Chronic atrial fibrillation, unspecified: Secondary | ICD-10-CM | POA: Diagnosis not present

## 2017-11-14 DIAGNOSIS — K5901 Slow transit constipation: Secondary | ICD-10-CM

## 2017-11-14 DIAGNOSIS — G629 Polyneuropathy, unspecified: Secondary | ICD-10-CM

## 2017-11-14 DIAGNOSIS — E782 Mixed hyperlipidemia: Secondary | ICD-10-CM | POA: Diagnosis not present

## 2017-11-14 DIAGNOSIS — L602 Onychogryphosis: Secondary | ICD-10-CM | POA: Diagnosis not present

## 2017-11-14 NOTE — Progress Notes (Signed)
Location:  [facility] Room Number: 1A Place of Service:  ALF [id number]) Provider: Dinah [name] FNP-C   [name], [name], NP  Patient Care Team: [name], [name], NP as PCP - General (Family Medicine) Angelia Mould, MD as Attending Physician (Vascular Surgery) [name], MD as Attending Physician (Obstetrics and Gynecology) [name], MD (Orthopedic Surgery) [name], MD (Ophthalmology) [name], MD as Consulting Physician (Urology) [name], MD as Consulting Physician (Cardiology) [name], PA-C as Physician Assistant (Otolaryngology) [name], MD as Consulting Physician (Otolaryngology) [name], MD as Consulting Physician (Dermatology) [name], MD as Consulting Physician (Physical Medicine and Rehabilitation) [name], MD as Attending Physician (Gastroenterology) Mast, Man X, NP as Nurse Practitioner (Internal Medicine) Kathrynn Ducking, MD as Consulting Physician (Neurology) Debara Pickett Nadean Corwin, MD as Consulting Physician (Cardiology) Druscilla Brownie, MD as Consulting Physician (Dermatology)  Extended Emergency Contact Information Primary Emergency Contact: [name] of [place] Phone: [phone] Mobile Phone: [phone] Relation: Son  Code Status:  Full Code  Goals of care: Advanced Directive information Advanced Directives 11/14/2017  Does Patient Have a Medical Advance Directive? Yes  Type of Advance Directive [place]  Does patient want to make changes to medical advance directive? No - Patient declined  Copy of [place] in Chart? Yes  Would patient like information on creating a medical advance directive? -     Chief Complaint  Patient presents with  . Medical Management of Chronic Issues    Routine Visit    HPI:  Pt is a 84 y.o. female seen today [facility] for medical management of chronic diseases.She is  seen in her room today with facility Nurse present at bedside.she denies any new acute issues but states has an upcoming appointment 11/15/2017 for a cortisol injection to right hip due to pain.she states also just returned from the dentist had a partial palate placed and scheduled for a root cannula.she states had hard round stool but took prune juice with much improvement.she does not like drinking water but has been trying to stay hydrated. She was seen by [place] [facility] Neurologist for Neuropathy 11/04/2017 her Cymbalta was discontinued and was started on Lyrica 25 mg twice daily.she was also started on Elma cream to apply to her left leg for pretibial sensitivity and intermittent sharp pain.she states applies cream especially at bedtime but still has pain and sensitive to touch at times. She has a follow up appointment with Neurology.  She has had no fall episode since rolling out of bed 10/25/2017 with no injuries.She has had a 1.4 lbs weight gain over two months which is beneficial for patient due to previous weight loss.she drinks protein supplement between meals.     Past Medical History:  Diagnosis Date  . Abdominal bloating   . Atrial fibrillation ([place])   . Bruises easily   . Carotid artery occlusion    LEFT  . Dizziness   . DOE (dyspnea on exertion) 04/02/2014  . Fall at home Sept 2013, Dec. 2013  Jun 08, 2012  . GERD (gastroesophageal reflux disease) 02/11/2015  . Headache(784.0)   . Hoarseness   . Hypercholesterolemia   . Hyperglycemia 04/02/2014   Glucose 178 mg percent on 01/22/2014 04/05/14 Hgb A1c 6.6 Diet controlled.    . Hypothyroidism 04/15/2015  . Neuropathy    PERIPHERAL  . Pruritus   . Scoliosis   . Varicose veins    Past Surgical History:  Procedure Laterality Date  . ABDOMINAL HYSTERECTOMY  1954  . CHOLECYSTECTOMY  1997  . ESOPHAGOGASTRODUODENOSCOPY (EGD) WITH PROPOFOL N/A 11/25/2016   Procedure: ESOPHAGOGASTRODUODENOSCOPY (EGD) WITH PROPOFOL;  Surgeon: [name], MD;  Location: [facility];  Service: Endoscopy;  Laterality: N/A;  . [place]   correct scoliosis    No Known Allergies  Allergies as of 11/14/2017   No Known Allergies     Medication List        Accurate as of 11/14/17  4:28 PM. Always use your most recent med list.          acetaminophen 500 MG tablet Commonly known as:  TYLENOL Take 500 mg by mouth 2 (two) times daily.   aspirin 81 MG chewable tablet Chew 81 mg by mouth daily.   atenolol 25 MG tablet Commonly known as:  TENORMIN Take 12.5 mg by mouth 2 (two) times daily. Hold if SBP < 100 or HR < 60   BIOFREEZE ROLL-ON 4 % Gel Generic drug:  Menthol (Topical Analgesic) Apply 1 application topically every 8 (eight) hours. Apply to LLE   Biotin 5000 MCG Tabs Take 5,000 mcg by mouth daily.   fluticasone 50 MCG/ACT nasal spray Commonly known as:  FLONASE Place 2 sprays into both nostrils daily.   hydrogen peroxide 1.5 % Soln Apply 1 application topically as needed (Swish and spit for dry mouth).   ipratropium 0.06 % nasal spray Commonly known as:  ATROVENT Place 2 sprays into both nostrils 2 (two) times daily as needed for rhinitis.   lactose free nutrition Liqd Take 237 mLs by mouth 3 (three) times daily with meals. Per request of resident, states can't eat if takes it later   lidocaine-prilocaine cream Commonly known as:  EMLA Apply 1 application topically as needed.   MULTIVITAL tablet Take 1 tablet by mouth daily.   nystatin-triamcinolone cream Commonly known as:  MYCOLOG II Apply topically. Apply to the corners of mouth 4 x a day, May self apply and keep in room   ondansetron 4 MG tablet Commonly known as:  ZOFRAN Take 1 tablet (4 mg total) by mouth every 6 (six) hours as needed for nausea.   polyethylene glycol packet Commonly known as:  MIRALAX / GLYCOLAX Take 17 g daily as needed by mouth (for constipation).   pregabalin 25 MG capsule Commonly known as:  LYRICA Take 1  capsule (25 mg total) by mouth 2 (two) times daily.   simvastatin 10 MG tablet Commonly known as:  ZOCOR Take 10 mg by mouth daily.   sodium phosphate 7-19 GM/118ML Enem Place 1 enema rectally as needed for severe constipation. Give Fleet enema per rectum X 1 after removing hard stool or if soft stool is present in rectum.   traMADol 50 MG tablet Commonly known as:  ULTRAM Take 50 mg by mouth every 8 (eight) hours as needed.   triamcinolone cream 0.1 % Commonly known as:  KENALOG Apply 1 application topically as needed. Apply to itchy areas as needed. May keep in room and self apply   VIACTIV 500-500-40 MG-UNT-MCG Chew Generic drug:  Calcium-Vitamin D-Vitamin K Chew 1 tablet by mouth daily.       Review of Systems  Constitutional: Negative for appetite change, chills, fatigue, fever and unexpected weight change.       Weight gain 1.4 lbs over two months  HENT: Negative for congestion, rhinorrhea, sinus pressure, sinus pain, sneezing and sore throat.   Eyes: Positive for visual  disturbance. Negative for discharge and itching.       Wears eye glasses   Respiratory: Negative for cough, chest tightness, shortness of breath and wheezing.   Cardiovascular: Negative for chest pain, palpitations and leg swelling.  Gastrointestinal: Positive for constipation. Negative for abdominal distention, abdominal pain, diarrhea, nausea and vomiting.  Endocrine: Negative for cold intolerance, heat intolerance, polydipsia, polyphagia and polyuria.  Genitourinary: Negative for dysuria and flank pain.       Chronic frequency at times during the day but none in the night.  Musculoskeletal: Positive for arthralgias, back pain and gait problem.  Skin: Negative for color change, pallor, rash and wound.  Neurological: Negative for dizziness, weakness, light-headedness and headaches.       Peripheral neuropathy feet   Hematological: Does not bruise/bleed easily.  Psychiatric/Behavioral: Negative for  agitation, confusion and sleep disturbance. The patient is not nervous/anxious.     Immunization History  Administered Date(s) Administered  . Influenza Split 10/20/2011, 11/03/2012  . Influenza,inj,Quad PF,6+ Mos 09/29/2012, 11/09/2013  . Influenza-Unspecified 10/17/2014, 10/23/2015, 10/20/2016, 10/17/2017  . PPD Test 08/21/2013, 03/04/2014  . Pneumococcal Conjugate-13 08/21/2013  . Pneumococcal Polysaccharide-23 09/08/2016  . Tdap 05/20/2008  . Zoster 11/01/2012   Pertinent  Health Maintenance Due  Topic Date Due  . INFLUENZA VACCINE  Completed  . DEXA SCAN  Completed  . PNA vac Low Risk Adult  Completed   Fall Risk  11/04/2017 09/05/2017 09/22/2016 08/25/2016 05/31/2016  Falls in the past year? Yes Yes Yes Yes No  Number falls in past yr: 2 or more 2 or more 1 2 or more -  Comment - - - - -  Injury with Fall? Yes Yes Yes Yes -  Comment - - fx 3 ribs - -  Risk Factor Category  - - - - -  Risk for fall due to : - - Impaired mobility;Impaired balance/gait - -  Risk for fall due to: Comment - - - - -    Vitals:   11/14/17 1114  BP: 136/74  Pulse: 84  Resp: 16  Temp: 97.9 F (36.6 C)  TempSrc: Oral  SpO2: 94%  Weight: 95 lb 12.8 oz (43.5 kg)  Height: 5\' 2"  (1.575 m)   Body mass index is 17.52 kg/m. Physical Exam  Constitutional: She is oriented to person, place, and time.  Thin built frail elderly in no acute distress   HENT:  Head: Normocephalic.  Right Ear: External ear normal.  Left Ear: External ear normal.  Mouth/Throat: Oropharynx is clear and moist. No oropharyngeal exudate.  Eyes: Pupils are equal, round, and reactive to light. Conjunctivae and EOM are normal. Right eye exhibits no discharge. Left eye exhibits no discharge. No scleral icterus.  Eye glasses in place   Neck: Normal range of motion. No JVD present. No thyromegaly present.  Cardiovascular: Intact distal pulses. Exam reveals no gallop and no friction rub.  Murmur heard. Bilateral lower  extremities varicose veins   Pulmonary/Chest: Effort normal and breath sounds normal. No respiratory distress. She has no wheezes. She has no rales.  Abdominal: Soft. Bowel sounds are normal. She exhibits no distension and no mass. There is no tenderness. There is no rebound and no guarding.  Genitourinary:  Genitourinary Comments: Continent   Musculoskeletal: She exhibits no edema.  Moves x 4 extremities.arthric changes to fingers.overgrown toenails noted.   Lymphadenopathy:    She has no cervical adenopathy.  Neurological: She is oriented to person, place, and time. Gait abnormal.  Skin: Skin is  warm and dry. Capillary refill takes 2 to 3 seconds. No rash noted. No erythema. No pallor.  Left lower leg chronic brown skin discoloration sensitive to touch.    Psychiatric: She has a normal mood and affect. Her speech is normal and behavior is normal. Judgment and thought content normal.  Nursing note and vitals reviewed.   Labs reviewed: Recent Labs    12/02/16 0511 12/04/16 0220 12/07/16 0750 12/16/16 03/07/17 08/04/17  NA 138 135 135 137 140  140 140  K 4.2 4.2 3.9 4.8 4.9  4.9 4.2  CL 103 104 102  --   --  103  CO2 28 25 26   --   --  29  GLUCOSE 110* 89 100*  --   --   --   BUN 15 10 13 17 14 17   CREATININE 0.72 0.65 0.72 0.8 0.8  0.77 0.8  CALCIUM 8.7* 8.5* 8.9  --  9.1 9.3  MG 1.9  --  2.1  --   --   --    Recent Labs    12/04/16 0220 03/07/17 08/04/17  AST 24 22  22 24   ALT 13* 13  13 13   ALKPHOS 75 71  71 67  BILITOT 0.6 0.6  --   PROT 5.5* 5.7 5.9  ALBUMIN 2.7* 3.3  --    Recent Labs    12/01/16 1904 12/02/16 0511 12/04/16 0220  12/16/16 02/18/17 03/07/17 08/04/17  WBC 8.4 11.1* 9.9   < > 6.9 7.8  7.8 6.9 6.2  NEUTROABS 6.3 8.0*  --   --   --   --   --   --   HGB 13.8 11.5* 11.6*  --  11.8* 13.5  13.5 11.9 12.2  HCT 42.0 35.1* 34.6*  --  34* 39  38.9 34 35*  MCV 98.4 98.3 98.0  --   --   --   --   --   PLT 221 199 213  --  276 256  --  198   < > =  values in this interval not displayed.   Lab Results  Component Value Date   TSH 3.44 08/04/2017   Lab Results  Component Value Date   HGBA1C 6.4 07/07/2015   Lab Results  Component Value Date   CHOL 187 11/03/2017   HDL 84 (A) 11/03/2017   LDLCALC 80 11/03/2017   TRIG 136 11/03/2017   CHOLHDL 2.1 07/29/2016    Significant Diagnostic Results in last 30 days:  No results found.  Assessment/Plan 1. Overgrown toenails Podiatrist to evaluate and treat as indicated.   2. Chronic atrial fibrillation Continue on Atenolol 12.5 mg tablet twice daily,ASA 81 mg tablet daily and simvastatin 10 mg tablet daily.   3. Neuropathy Seen by Neurologist 11/05/2017.continue on Lyrica 25 mg capsule twice daily.continue Emla cream to left pretibial paresthesia.follow up with Neurology as directed.   4. PVD (peripheral vascular disease) ([facility]) No ulceration or edema noted.advice to wear ted hose due to varicose vein but has declined.  5. Mixed hyperlipidemia LDL 80 (11/03/2017) continue on simvastatin 10 mg tablet daily.monitor lipid panel then decrease statin to prevent muscle weakness due to her advance age.   6. Slow transit constipation Continue on miralax 17 gm daily as needed and Prune juice daily.continue to encourage oral intake and hydration.  Family/ staff Communication: Reviewed plan of care with patient and facility Nurse.   Labs/tests ordered: None   Dinah C [name], NP

## 2017-12-06 DIAGNOSIS — M5416 Radiculopathy, lumbar region: Secondary | ICD-10-CM | POA: Diagnosis not present

## 2017-12-12 ENCOUNTER — Non-Acute Institutional Stay: Payer: Medicare Other | Admitting: Family

## 2017-12-12 ENCOUNTER — Encounter: Payer: Self-pay | Admitting: Family

## 2017-12-12 DIAGNOSIS — R682 Dry mouth, unspecified: Secondary | ICD-10-CM

## 2017-12-12 DIAGNOSIS — K117 Disturbances of salivary secretion: Secondary | ICD-10-CM

## 2017-12-12 DIAGNOSIS — J040 Acute laryngitis: Secondary | ICD-10-CM | POA: Diagnosis not present

## 2017-12-12 DIAGNOSIS — M25551 Pain in right hip: Secondary | ICD-10-CM

## 2017-12-12 NOTE — Progress Notes (Addendum)
Location:  [place] Room Number: 1A Place of Service:  ALF [phone]) Provider: Dinah [name] FNP-C  [name], [name], NP  Patient Care Team: [name], [name], NP as PCP - General (Family Medicine) Angelia Mould, MD as Attending Physician (Vascular Surgery) [name], MD as Attending Physician (Obstetrics and Gynecology) [name], MD (Orthopedic Surgery) [name], MD (Ophthalmology) [name], MD as Consulting Physician (Urology) [name], MD as Consulting Physician (Cardiology) [name], PA-C as Physician Assistant (Otolaryngology) [name], MD as Consulting Physician (Otolaryngology) [name], MD as Consulting Physician (Dermatology) [name], MD as Consulting Physician (Physical Medicine and Rehabilitation) [name], MD as Attending Physician (Gastroenterology) Mast, Man X, NP as Nurse Practitioner (Internal Medicine) Kathrynn Ducking, MD as Consulting Physician (Neurology) Debara Pickett Nadean Corwin, MD as Consulting Physician (Cardiology) Druscilla Brownie, MD as Consulting Physician (Dermatology)  Extended Emergency Contact Information Primary Emergency Contact: [name] of [place] Phone: [phone] Mobile Phone: [phone] Relation: Son  Code Status:  Full Code  Goals of care: Advanced Directive information Advanced Directives 12/12/2017  Does Patient Have a Medical Advance Directive? Yes  Type of Advance Directive [place]  Does patient want to make changes to medical advance directive? No - Patient declined  Copy of [place] in Chart? No - copy requested  Would patient like information on creating a medical advance directive? -     Chief Complaint  Patient presents with  . Acute Visit    laryngitis     HPI:  Pt is a 84 y.o. female seen today at [facility] for an acute visit for evaluation of laryngitis from  12/09/2017.Nurse reported via SBAR that patient took throat lozenges without any relief.she is seen in her room today with facility Nurse supervisor present at bedside.Patient states she lost her voice on Friday but has improved.she denies of any fever,chills,cough or runny nose. She complains of right hip pain recent cortisol injection site with orthopedic.she states areas still sore and worst with walking.she has taken her tramadol with some relief but does not like tramadol " Makes me sleepy".  She was also seen by dentist 11/22/2017 for dry mouth SalivaMax powder swish and spit twice daily was initiated.Patient states Salivamax has helped with her dry mouth symptoms.     Past Medical History:  Diagnosis Date  . Abdominal bloating   . Atrial fibrillation ([place])   . Bruises easily   . Carotid artery occlusion    LEFT  . Dizziness   . DOE (dyspnea on exertion) 04/02/2014  . Fall at home Sept 2013, Dec. 2013  Jun 08, 2012  . GERD (gastroesophageal reflux disease) 02/11/2015  . Headache(784.0)   . Hoarseness   . Hypercholesterolemia   . Hyperglycemia 04/02/2014   Glucose 178 mg percent on 01/22/2014 04/05/14 Hgb A1c 6.6 Diet controlled.    . Hypothyroidism 04/15/2015  . Neuropathy    PERIPHERAL  . Pruritus   . Scoliosis   . Varicose veins    Past Surgical History:  Procedure Laterality Date  . ABDOMINAL HYSTERECTOMY  1954  . CHOLECYSTECTOMY  1997  . ESOPHAGOGASTRODUODENOSCOPY (EGD) WITH PROPOFOL N/A 11/25/2016   Procedure: ESOPHAGOGASTRODUODENOSCOPY (EGD) WITH PROPOFOL;  Surgeon: [name], MD;  Location: [facility];  Service: Endoscopy;  Laterality: N/A;  . SPINE SURGERY  1997   correct scoliosis    No Known Allergies  Outpatient Encounter Medications as of 12/12/2017  Medication Sig  .  acetaminophen (TYLENOL) 500 MG tablet Take 500 mg by mouth 2 (two) times daily.   . Artificial Saliva (SALIVAMAX) PACK Use as directed 1 Package in the mouth or throat. mix with 21mL's of  H20; swish and spit BID; may keep in bathroom; may self administer.  Marland Kitchen aspirin 81 MG chewable tablet Chew 81 mg by mouth daily.  Marland Kitchen atenolol (TENORMIN) 25 MG tablet Take 12.5 mg by mouth 2 (two) times daily. Hold if SBP < 100 or HR < 60  . Biotin 5000 MCG TABS Take 5,000 mcg by mouth daily.  . Calcium-Vitamin D-Vitamin K (VIACTIV) [id number] MG-UNT-MCG CHEW Chew 1 tablet by mouth daily.   . fluticasone (FLONASE) 50 MCG/ACT nasal spray Place 2 sprays into both nostrils daily.  . hydrogen peroxide 1.5 % SOLN Apply 1 application topically as needed (Swish and spit for dry mouth).  Marland Kitchen ipratropium (ATROVENT) 0.06 % nasal spray Place 2 sprays into both nostrils 2 (two) times daily as needed for rhinitis.  Marland Kitchen lactose free nutrition (BOOST PLUS) LIQD Take 237 mLs by mouth 3 (three) times daily with meals. Per request of resident, states can't eat if takes it later  . lidocaine-prilocaine (EMLA) cream Apply 1 application topically as needed.  . Menthol, Topical Analgesic, (BIOFREEZE ROLL-ON) 4 % GEL Apply 1 application topically every 8 (eight) hours. Apply to LLE  . Multiple Vitamins-Minerals (MULTIVITAL) tablet Take 1 tablet by mouth daily.  . ondansetron (ZOFRAN) 4 MG tablet Take 1 tablet (4 mg total) by mouth every 6 (six) hours as needed for nausea.  . polyethylene glycol (MIRALAX / GLYCOLAX) packet Take 17 g daily as needed by mouth (for constipation).   . pregabalin (LYRICA) 25 MG capsule Take 1 capsule (25 mg total) by mouth 2 (two) times daily.  . simvastatin (ZOCOR) 10 MG tablet Take 10 mg by mouth daily.  . sodium phosphate (FLEET) 7-19 GM/118ML ENEM Place 1 enema rectally as needed for severe constipation. Give Fleet enema per rectum X 1 after removing hard stool or if soft stool is present in rectum.  . traMADol (ULTRAM) 50 MG tablet Take 50 mg by mouth 3 (three) times daily as needed.  . triamcinolone cream (KENALOG) 0.1 % Apply 1 application topically as needed. Apply to itchy areas as needed.  May keep in room and self apply  . [DISCONTINUED] traMADol (ULTRAM) 50 MG tablet Take 50 mg by mouth every 8 (eight) hours as needed.    No facility-administered encounter medications on file as of 12/12/2017.     Review of Systems  Constitutional: Negative for appetite change, chills, fatigue, fever and unexpected weight change.  HENT: Positive for hearing loss. Negative for congestion, postnasal drip, rhinorrhea, sinus pressure, sinus pain, sneezing and sore throat.        Laryngitis has improved   Eyes: Positive for visual disturbance. Negative for pain, discharge and redness.       Wears eye glasses   Respiratory: Negative for cough, chest tightness, shortness of breath and wheezing.   Cardiovascular: Negative for chest pain, palpitations and leg swelling.  Gastrointestinal: Negative for abdominal distention, abdominal pain, constipation, diarrhea, nausea and vomiting.  Musculoskeletal: Positive for arthralgias and gait problem.       Right hip pain per HPI   Skin: Negative for color change, pallor, rash and wound.  Neurological: Negative for dizziness, light-headedness and headaches.  Psychiatric/Behavioral: Negative for agitation and sleep disturbance. The patient is not nervous/anxious.     Immunization History  Administered Date(s) Administered  .  Influenza Split 10/20/2011, 11/03/2012  . Influenza,inj,Quad PF,6+ Mos 09/29/2012, 11/09/2013  . Influenza-Unspecified 10/17/2014, 10/23/2015, 10/20/2016, 10/17/2017  . PPD Test 08/21/2013, 03/04/2014  . Pneumococcal Conjugate-13 08/21/2013  . Pneumococcal Polysaccharide-23 09/08/2016  . Tdap 05/20/2008  . Zoster 11/01/2012   Pertinent  Health Maintenance Due  Topic Date Due  . INFLUENZA VACCINE  Completed  . DEXA SCAN  Completed  . PNA vac Low Risk Adult  Completed   Fall Risk  11/04/2017 09/05/2017 09/22/2016 08/25/2016 05/31/2016  Falls in the past year? Yes Yes Yes Yes No  Number falls in past yr: 2 or more 2 or more 1 2 or  more -  Comment - - - - -  Injury with Fall? Yes Yes Yes Yes -  Comment - - fx 3 ribs - -  Risk Factor Category  - - - - -  Risk for fall due to : - - Impaired mobility;Impaired balance/gait - -  Risk for fall due to: Comment - - - - -    Vitals:   12/12/17 1050  BP: 124/72  Pulse: 72  Resp: 18  Temp: 97.6 F (36.4 C)  TempSrc: Oral  SpO2: 92%  Weight: 95 lb 12.8 oz (43.5 kg)  Height: 5\' 2"  (1.575 m)   Body mass index is 17.52 kg/m. Physical Exam  Constitutional: She is oriented to person, place, and time.  Thin built frail elderly in no acute distress.   HENT:  Head: Normocephalic.  Mouth/Throat: Oropharynx is clear and moist. No oropharyngeal exudate.  Eyes: Pupils are equal, round, and reactive to light. Conjunctivae and EOM are normal. Right eye exhibits no discharge. Left eye exhibits no discharge. No scleral icterus.  Neck: Normal range of motion. No JVD present. No thyromegaly present.  Cardiovascular: Normal rate and intact distal pulses. Exam reveals no gallop and no friction rub.  Murmur heard. Pulmonary/Chest: Effort normal and breath sounds normal. No respiratory distress. She has no wheezes. She has no rales.  Abdominal: Soft. Bowel sounds are normal. She exhibits no distension and no mass. There is no tenderness. There is no rebound and no guarding.  Musculoskeletal: Normal range of motion. She exhibits no edema or tenderness.  Unsteady gait ambulate with walker   Lymphadenopathy:    She has no cervical adenopathy.  Neurological: She is oriented to person, place, and time. Gait abnormal.  Skin: Skin is warm and dry. No rash noted. No erythema. No pallor.  Right sacroiliac recent injection site without any redness,tenderness,swelling or drainage.  Psychiatric: She has a normal mood and affect. Her speech is normal and behavior is normal. Judgment and thought content normal.  Nursing note and vitals reviewed.   Labs reviewed: Recent Labs    12/16/16  03/07/17 08/04/17  NA 137 140  140 140  K 4.8 4.9  4.9 4.2  CL  --   --  103  CO2  --   --  29  BUN 17 14 17   CREATININE 0.8 0.8  0.77 0.8  CALCIUM  --  9.1 9.3   Recent Labs    03/07/17 08/04/17  AST 22  22 24   ALT 13  13 13   ALKPHOS 71  71 67  BILITOT 0.6  --   PROT 5.7 5.9  ALBUMIN 3.3  --    Recent Labs    12/16/16 02/18/17 03/07/17 08/04/17  WBC 6.9 7.8  7.8 6.9 6.2  HGB 11.8* 13.5  13.5 11.9 12.2  HCT 34* 39  38.9 34 35*  PLT 276 256  --  198   Lab Results  Component Value Date   TSH 3.44 08/04/2017   Lab Results  Component Value Date   HGBA1C 6.4 07/07/2015   Lab Results  Component Value Date   CHOL 187 11/03/2017   HDL 84 (A) 11/03/2017   LDLCALC 80 11/03/2017   TRIG 136 11/03/2017   CHOLHDL 2.1 07/29/2016    Significant Diagnostic Results in last 30 days:  No results found.  Assessment/Plan 1. Laryngitis Was worse on 12/09/2017 but symptoms have improved per patient.Afebrile.No difficulties swallowing or pain.Encouraged to increase fluid intake and rest voice.Notify provider or Nurse if running any fever or chills.continue on  Throat lozenges as needed.Recently started on Salivamax powder swish and spit by dentist will continue to monitor for possible side effects from new medication and reevaluate if no improvement.continue to monitor.   2. Xerostomia salivamax powder effective.continue to follow up with dentist.   3. Right hip pain Right sacroiliac recent injection site without any redness,tenderness,swelling or drainage.continue on current pain regimen.continue to follow up with Orthopedic specialist as directed.   Family/ staff Communication: Reviewed plan of care with patient and facility Nurse supervisor  Labs/tests ordered: None   Sandrea Hughs, NP

## 2017-12-20 DIAGNOSIS — I240 Acute coronary thrombosis not resulting in myocardial infarction: Secondary | ICD-10-CM | POA: Diagnosis not present

## 2017-12-20 DIAGNOSIS — M6281 Muscle weakness (generalized): Secondary | ICD-10-CM | POA: Diagnosis not present

## 2017-12-20 DIAGNOSIS — R2681 Unsteadiness on feet: Secondary | ICD-10-CM | POA: Diagnosis not present

## 2017-12-21 DIAGNOSIS — M5416 Radiculopathy, lumbar region: Secondary | ICD-10-CM | POA: Diagnosis not present

## 2017-12-21 DIAGNOSIS — M6281 Muscle weakness (generalized): Secondary | ICD-10-CM | POA: Diagnosis not present

## 2017-12-21 DIAGNOSIS — R2681 Unsteadiness on feet: Secondary | ICD-10-CM | POA: Diagnosis not present

## 2017-12-21 DIAGNOSIS — I240 Acute coronary thrombosis not resulting in myocardial infarction: Secondary | ICD-10-CM | POA: Diagnosis not present

## 2017-12-22 DIAGNOSIS — M6281 Muscle weakness (generalized): Secondary | ICD-10-CM | POA: Diagnosis not present

## 2017-12-22 DIAGNOSIS — I240 Acute coronary thrombosis not resulting in myocardial infarction: Secondary | ICD-10-CM | POA: Diagnosis not present

## 2017-12-22 DIAGNOSIS — R2681 Unsteadiness on feet: Secondary | ICD-10-CM | POA: Diagnosis not present

## 2017-12-23 ENCOUNTER — Encounter: Payer: Self-pay | Admitting: Family

## 2017-12-23 ENCOUNTER — Non-Acute Institutional Stay: Payer: Medicare Other | Admitting: Family

## 2017-12-23 DIAGNOSIS — M6281 Muscle weakness (generalized): Secondary | ICD-10-CM

## 2017-12-23 DIAGNOSIS — E782 Mixed hyperlipidemia: Secondary | ICD-10-CM

## 2017-12-23 DIAGNOSIS — G3184 Mild cognitive impairment, so stated: Secondary | ICD-10-CM | POA: Diagnosis not present

## 2017-12-25 NOTE — Progress Notes (Signed)
Location:  [place] Room Number: 1 Place of Service:  ALF [phone]) Provider: Amritha Yorke FNP-C  Chantalle Defilippo, [name], NP  Patient Care Team: Jacub Waiters, [name], NP as PCP - General (Family Medicine) Angelia Mould, MD as Attending Physician (Vascular Surgery) [name], MD as Attending Physician (Obstetrics and Gynecology) [name], MD (Orthopedic Surgery) [name], MD (Ophthalmology) [name], MD as Consulting Physician (Urology) [name], MD as Consulting Physician (Cardiology) [name], PA-C as Physician Assistant (Otolaryngology) [name], MD as Consulting Physician (Otolaryngology) [name], MD as Consulting Physician (Dermatology) [name], MD as Consulting Physician (Physical Medicine and Rehabilitation) [name], MD as Attending Physician (Gastroenterology) Mast, Man X, NP as Nurse Practitioner (Internal Medicine) Kathrynn Ducking, MD as Consulting Physician (Neurology) Debara Pickett Nadean Corwin, MD as Consulting Physician (Cardiology) Druscilla Brownie, MD as Consulting Physician (Dermatology)  Extended Emergency Contact Information Primary Emergency Contact: [name] of [place] Phone: [phone] Mobile Phone: [phone] Relation: Son  Code Status: Full Code  Goals of care: Advanced Directive information Advanced Directives 12/23/2017  Does Patient Have a Medical Advance Directive? Yes  Type of Advance Directive [place]  Does patient want to make changes to medical advance directive? No - Patient declined  Copy of [place] in Chart? Yes - validated most recent copy scanned in chart (See row information)  Would patient like information on creating a medical advance directive? -     Chief Complaint  Patient presents with  . Acute Visit    Medication management and review    HPI:  Pt is a 84 y.o. female seen today at  [facility] for an acute visit for medication review.she is seen in her room today per facility Nurse request.Nurse reports via SBAR that patient was seen by Dr.Ramos patient complained of chronic muscle weakness,nerve pain ,constipation and worsening memory issues.MD recommended evaluation of patient's simvastatin could cause of her weakness.patient states has a bowel movement without any diffulties since then.  Past Medical History:  Diagnosis Date  . Abdominal bloating   . Atrial fibrillation ([place])   . Bruises easily   . Carotid artery occlusion    LEFT  . Dizziness   . DOE (dyspnea on exertion) 04/02/2014  . Fall at home Sept 2013, Dec. 2013  Jun 08, 2012  . GERD (gastroesophageal reflux disease) 02/11/2015  . Headache(784.0)   . Hoarseness   . Hypercholesterolemia   . Hyperglycemia 04/02/2014   Glucose 178 mg percent on 01/22/2014 04/05/14 Hgb A1c 6.6 Diet controlled.    . Hypothyroidism 04/15/2015  . Neuropathy    PERIPHERAL  . Pruritus   . Scoliosis   . Varicose veins    Past Surgical History:  Procedure Laterality Date  . ABDOMINAL HYSTERECTOMY  1954  . CHOLECYSTECTOMY  1997  . ESOPHAGOGASTRODUODENOSCOPY (EGD) WITH PROPOFOL N/A 11/25/2016   Procedure: ESOPHAGOGASTRODUODENOSCOPY (EGD) WITH PROPOFOL;  Surgeon: [name], MD;  Location: [facility];  Service: Endoscopy;  Laterality: N/A;  . [employer]   correct scoliosis    No Known Allergies  Outpatient Encounter Medications as of 12/23/2017  Medication Sig  . acetaminophen (TYLENOL) 500 MG tablet Take 500 mg by mouth 2 (two) times daily.   . Artificial Saliva (SALIVAMAX) PACK Use as directed 1 Package in the mouth or throat. mix with 33mL's of H20; swish and spit BID; may keep in bathroom; may self administer.  Marland Kitchen aspirin 81  MG chewable tablet Chew 81 mg by mouth daily.  Marland Kitchen atenolol (TENORMIN) 25 MG tablet Take 12.5 mg by mouth 2 (two) times daily. Hold if SBP < 100 or HR < 60  . Biotin 5000 MCG TABS  Take 5,000 mcg by mouth daily.  . Calcium-Vitamin D-Vitamin K (VIACTIV) [id number] MG-UNT-MCG CHEW Chew 1 tablet by mouth daily.   . fluticasone (FLONASE) 50 MCG/ACT nasal spray Place 2 sprays into both nostrils daily.  . hydrogen peroxide 1.5 % SOLN Apply 1 application topically as needed (Swish and spit for dry mouth).  Marland Kitchen ipratropium (ATROVENT) 0.06 % nasal spray Place 2 sprays into both nostrils 2 (two) times daily as needed for rhinitis.  Marland Kitchen lactose free nutrition (BOOST PLUS) LIQD Take 237 mLs by mouth 3 (three) times daily with meals. Per request of resident, states can't eat if takes it later  . lidocaine-prilocaine (EMLA) cream Apply 1 application topically as needed.  . Menthol, Topical Analgesic, (BIOFREEZE ROLL-ON) 4 % GEL Apply 1 application topically every 8 (eight) hours. Apply to LLE  . Multiple Vitamins-Minerals (MULTIVITAL) tablet Take 1 tablet by mouth daily.  . ondansetron (ZOFRAN) 4 MG tablet Take 1 tablet (4 mg total) by mouth every 6 (six) hours as needed for nausea.  . polyethylene glycol (MIRALAX / GLYCOLAX) packet Take 17 g daily as needed by mouth (for constipation).   . pregabalin (LYRICA) 25 MG capsule Take 1 capsule (25 mg total) by mouth 2 (two) times daily.  . simvastatin (ZOCOR) 10 MG tablet Take 10 mg by mouth daily.  . sodium phosphate (FLEET) 7-19 GM/118ML ENEM Place 1 enema rectally as needed for severe constipation. Give Fleet enema per rectum X 1 after removing hard stool or if soft stool is present in rectum.  . traMADol (ULTRAM) 50 MG tablet Take 50 mg by mouth 3 (three) times daily as needed.  . triamcinolone cream (KENALOG) 0.1 % Apply 1 application topically as needed. Apply to itchy areas as needed. May keep in room and self apply   No facility-administered encounter medications on file as of 12/23/2017.     Review of Systems  Constitutional: Negative for appetite change, chills, fatigue and fever.  HENT: Negative for congestion, rhinorrhea, sinus  pressure, sinus pain, sneezing and sore throat.   Eyes: Positive for visual disturbance. Negative for discharge, redness and itching.  Respiratory: Negative for cough, chest tightness, shortness of breath and wheezing.   Cardiovascular: Negative for chest pain, palpitations and leg swelling.  Gastrointestinal: Negative for abdominal distention, abdominal pain, constipation, diarrhea, nausea and vomiting.  Genitourinary: Negative for dysuria, flank pain, frequency and urgency.  Musculoskeletal: Positive for back pain and gait problem.  Skin: Negative for color change, pallor and rash.  Neurological: Negative for dizziness, light-headedness and headaches.  Psychiatric/Behavioral: Negative for agitation, confusion and sleep disturbance. The patient is not nervous/anxious.     Immunization History  Administered Date(s) Administered  . Influenza Split 10/20/2011, 11/03/2012  . Influenza,inj,Quad PF,6+ Mos 09/29/2012, 11/09/2013  . Influenza-Unspecified 10/17/2014, 10/23/2015, 10/20/2016, 10/17/2017  . PPD Test 08/21/2013, 03/04/2014  . Pneumococcal Conjugate-13 08/21/2013  . Pneumococcal Polysaccharide-23 09/08/2016  . Tdap 05/20/2008  . Zoster 11/01/2012   Pertinent  Health Maintenance Due  Topic Date Due  . INFLUENZA VACCINE  Completed  . DEXA SCAN  Completed  . PNA vac Low Risk Adult  Completed   Fall Risk  11/04/2017 09/05/2017 09/22/2016 08/25/2016 05/31/2016  Falls in the past year? Yes Yes Yes Yes No  Number falls in  past yr: 2 or more 2 or more 1 2 or more -  Comment - - - - -  Injury with Fall? Yes Yes Yes Yes -  Comment - - fx 3 ribs - -  Risk Factor Category  - - - - -  Risk for fall due to : - - Impaired mobility;Impaired balance/gait - -  Risk for fall due to: Comment - - - - -    Vitals:   12/23/17 1048  BP: 110/73  Pulse: 88  Resp: 20  Temp: (!) 97.3 F (36.3 C)  TempSrc: Oral  SpO2: 94%  Weight: 99 lb 3.2 oz (45 kg)  Height: 5\' 2"  (1.575 m)   Body mass index  is 18.14 kg/m. Physical Exam Vitals signs and nursing note reviewed.  Constitutional:      General: She is not in acute distress. HENT:     Head: Normocephalic.     Right Ear: Tympanic membrane, ear canal and external ear normal.     Left Ear: Tympanic membrane, ear canal and external ear normal.     Nose: Nose normal. No congestion or rhinorrhea.     Mouth/Throat:     Mouth: Mucous membranes are moist.     Pharynx: Oropharynx is clear. No oropharyngeal exudate or posterior oropharyngeal erythema.  Eyes:     General: No scleral icterus.       Right eye: No discharge.        Left eye: No discharge.     Extraocular Movements: Extraocular movements intact.     Conjunctiva/sclera: Conjunctivae normal.     Pupils: Pupils are equal, round, and reactive to light.     Comments: Corrective lens in place  Neck:     Musculoskeletal: Normal range of motion. No muscular tenderness.  Cardiovascular:     Rate and Rhythm: Normal rate and regular rhythm.     Pulses: Normal pulses.     Heart sounds: No murmur. No friction rub. No gallop.   Pulmonary:     Effort: Pulmonary effort is normal. No respiratory distress.     Breath sounds: Normal breath sounds. No wheezing, rhonchi or rales.  Abdominal:     General: Abdomen is flat. Bowel sounds are normal. There is no distension.     Palpations: Abdomen is soft. There is no mass.     Tenderness: There is no abdominal tenderness. There is no right CVA tenderness, left CVA tenderness, guarding or rebound.  Musculoskeletal: Normal range of motion.        General: No swelling.     Right lower leg: No edema.     Left lower leg: No edema.     Comments: Unsteady gait uses walker  Lymphadenopathy:     Cervical: No cervical adenopathy.  Skin:    General: Skin is warm and dry.     Coloration: Skin is not pale.     Findings: No bruising or erythema.  Neurological:     Mental Status: She is alert and oriented to person, place, and time.     Motor: No  weakness.     Gait: Gait abnormal.     Comments: Forgetful  Psychiatric:        Mood and Affect: Mood normal.        Behavior: Behavior normal.        Thought Content: Thought content normal.        Judgment: Judgment normal.     Labs reviewed: Recent Labs  03/07/17 08/04/17  NA 140  140 140  K 4.9  4.9 4.2  CL  --  103  CO2  --  29  BUN 14 17  CREATININE 0.8  0.77 0.8  CALCIUM 9.1 9.3   Recent Labs    03/07/17 08/04/17  AST 22  22 24   ALT 13  13 13   ALKPHOS 71  71 67  BILITOT 0.6  --   PROT 5.7 5.9  ALBUMIN 3.3  --    Recent Labs    02/18/17 03/07/17 08/04/17  WBC 7.8  7.8 6.9 6.2  HGB 13.5  13.5 11.9 12.2  HCT 39  38.9 34 35*  PLT 256  --  198   Lab Results  Component Value Date   TSH 3.44 08/04/2017   Lab Results  Component Value Date   HGBA1C 6.4 07/07/2015   Lab Results  Component Value Date   CHOL 187 11/03/2017   HDL 84 (A) 11/03/2017   LDLCALC 80 11/03/2017   TRIG 136 11/03/2017   CHOLHDL 2.1 07/29/2016    Significant Diagnostic Results in last 30 days:  No results found.  Assessment/Plan 1. Muscle weakness Reports generalized muscle weakness suspect due to simvastatin.Will discontinue simvastatin then continue to monitor.   2. Mild cognitive impairment Reports increased forgetfulness.D/c simvastatin as above.Obtain MMSE to screen for cognitive impairment.   3. Mixed hyperlipidemia Recent LDL was 80 simvastatin was decreased to 10 mg tablet will discontinue given complains of generalized muscle weakness and her advance age.  Family/ staff Communication: Reviewed plan of care with patient and facility Nurse.   Labs/tests ordered: None   Amalie Koran C Jahdai Padovano, NP

## 2017-12-27 DIAGNOSIS — I240 Acute coronary thrombosis not resulting in myocardial infarction: Secondary | ICD-10-CM | POA: Diagnosis not present

## 2017-12-27 DIAGNOSIS — R2681 Unsteadiness on feet: Secondary | ICD-10-CM | POA: Diagnosis not present

## 2017-12-27 DIAGNOSIS — M6281 Muscle weakness (generalized): Secondary | ICD-10-CM | POA: Diagnosis not present

## 2017-12-28 DIAGNOSIS — I240 Acute coronary thrombosis not resulting in myocardial infarction: Secondary | ICD-10-CM | POA: Diagnosis not present

## 2017-12-28 DIAGNOSIS — R2681 Unsteadiness on feet: Secondary | ICD-10-CM | POA: Diagnosis not present

## 2017-12-28 DIAGNOSIS — M6281 Muscle weakness (generalized): Secondary | ICD-10-CM | POA: Diagnosis not present

## 2017-12-29 ENCOUNTER — Ambulatory Visit (INDEPENDENT_AMBULATORY_CARE_PROVIDER_SITE_OTHER): Payer: Medicare Other | Admitting: Family

## 2017-12-29 ENCOUNTER — Ambulatory Visit (HOSPITAL_COMMUNITY)
Admission: RE | Admit: 2017-12-29 | Discharge: 2017-12-29 | Disposition: A | Discharge status: Home / Self Care | Payer: Medicare Other | Source: Ambulatory Visit | Attending: Family | Admitting: Family

## 2017-12-29 ENCOUNTER — Encounter: Payer: Self-pay | Admitting: Family

## 2017-12-29 VITALS — BP 119/58 | HR 84 | Temp 97.0°F | Resp 14 | Ht 62.0 in | Wt 96.0 lb

## 2017-12-29 DIAGNOSIS — I6521 Occlusion and stenosis of right carotid artery: Secondary | ICD-10-CM

## 2017-12-29 DIAGNOSIS — I6522 Occlusion and stenosis of left carotid artery: Secondary | ICD-10-CM | POA: Diagnosis not present

## 2017-12-29 NOTE — Patient Instructions (Signed)

## 2017-12-29 NOTE — Progress Notes (Signed)
Chief Complaint: Follow up Extracranial Carotid Artery Stenosis   History of Present Illness  Vanessa [name] is a 84 y.o. female whom Dr. Scot Dock has been monitoring for carotid artery stenosis; she has a known left internal carotid artery occlusion and minimal right ICA stenosis.  She returned on 05-03-17 from [facility] with c/o left foot, ankle, and lower leg intermittent pain that started about 3 months prior.   Had ABI's done in March 2019 at another facility which were normal bilaterally with triphasic waveforms. Lower left leg x-ray results were normal.  She does not know if she has been tested for gout. She walks daily with her walker.  I saw her on 8/16/17with c/o small patches of pruritic rash at both ankles, and at her left wrist under her watch. Thesehave resolved.  She has lower legs varicosities with no edema. She spoke with our vein clinic nurse in the past re her varicosities and was given a pair of compression hose, but cannot get them on due to the weakness in her wrists. [name], vein clinic nurse,spoke with ptsubsequentlyand discussed donning compression hose.  Patient has not had previous carotid artery intervention.  She denies anyhistory of TIA or stroke symptoms.Specifically she denies a history ofamaurosis fugax or monocular blindness, unilateralfacial drooping, hemiplegia, or receptive or expressive aphasia.   Shehad some presyncopal episodes, hasfallen several times. She has a historyof extreme arthritis in her neck, tookprednisone for this.   She resides at [facility], in assisted living.     Diabetic: No Tobacco use: non-smoker  Pt meds include: Statin : Yes ASA: yes Other anticoagulants/antiplatelets: none, since she has a history of GI bleedor has fallen several times, has chronic atrial fib    Past Medical History:  Diagnosis Date  . Abdominal bloating   . Atrial fibrillation ([place])   . Bruises easily   .  Carotid artery occlusion    LEFT  . Dizziness   . DOE (dyspnea on exertion) 04/02/2014  . Fall at home Sept 2013, Dec. 2013  Jun 08, 2012  . GERD (gastroesophageal reflux disease) 02/11/2015  . Headache(784.0)   . Hoarseness   . Hypercholesterolemia   . Hyperglycemia 04/02/2014   Glucose 178 mg percent on 01/22/2014 04/05/14 Hgb A1c 6.6 Diet controlled.    . Hypothyroidism 04/15/2015  . Neuropathy    PERIPHERAL  . Pruritus   . Scoliosis   . Varicose veins     Social History Social History   Tobacco Use  . Smoking status: Never Smoker  . Smokeless tobacco: Never Used  Substance Use Topics  . Alcohol use: No    Alcohol/week: 0.0 standard drinks  . Drug use: No    Family History Family History  Adopted: Yes  Problem Relation Age of Onset  . Diabetes Mother   . Heart attack Mother   . Heart disease Mother        After age 36  . Heart attack Father   . Stroke Father   . Breast cancer Sister   . Heart disease Maternal Grandmother   . Cancer Son        adopted son. esophagus, stomach, liver  . Stomach cancer Neg Hx   . Colon cancer Neg Hx     Surgical History Past Surgical History:  Procedure Laterality Date  . ABDOMINAL HYSTERECTOMY  1954  . CHOLECYSTECTOMY  1997  . ESOPHAGOGASTRODUODENOSCOPY (EGD) WITH PROPOFOL N/A 11/25/2016   Procedure: ESOPHAGOGASTRODUODENOSCOPY (EGD) WITH PROPOFOL;  Surgeon: Owens Loffler  P, MD;  Location: [facility];  Service: Endoscopy;  Laterality: N/A;  . SPINE SURGERY  1997   correct scoliosis    No Known Allergies  Current Outpatient Medications  Medication Sig Dispense Refill  . acetaminophen (TYLENOL) 500 MG tablet Take 500 mg by mouth 2 (two) times daily.     . Artificial Saliva (SALIVAMAX) PACK Use as directed 1 Package in the mouth or throat. mix with 28mL's of H20; swish and spit BID; may keep in bathroom; may self administer.    Marland Kitchen aspirin 81 MG chewable tablet Chew 81 mg by mouth daily.    Marland Kitchen atenolol (TENORMIN) 25 MG tablet  Take 12.5 mg by mouth 2 (two) times daily. Hold if SBP < 100 or HR < 60    . Biotin 5000 MCG TABS Take 5,000 mcg by mouth daily.    . Calcium-Vitamin D-Vitamin K (VIACTIV) 595-638-75 MG-UNT-MCG CHEW Chew 1 tablet by mouth daily.     . fluticasone (FLONASE) 50 MCG/ACT nasal spray Place 2 sprays into both nostrils daily.    . hydrogen peroxide 1.5 % SOLN Apply 1 application topically as needed (Swish and spit for dry mouth).    Marland Kitchen ipratropium (ATROVENT) 0.06 % nasal spray Place 2 sprays into both nostrils 2 (two) times daily as needed for rhinitis.    Marland Kitchen lactose free nutrition (BOOST PLUS) LIQD Take 237 mLs by mouth 3 (three) times daily with meals. Per request of resident, states can't eat if takes it later    . lidocaine-prilocaine (EMLA) cream Apply 1 application topically as needed. 30 g 1  . Menthol, Topical Analgesic, (BIOFREEZE ROLL-ON) 4 % GEL Apply 1 application topically every 8 (eight) hours. Apply to LLE    . Multiple Vitamins-Minerals (MULTIVITAL) tablet Take 1 tablet by mouth daily.    . ondansetron (ZOFRAN) 4 MG tablet Take 1 tablet (4 mg total) by mouth every 6 (six) hours as needed for nausea. 20 tablet 0  . polyethylene glycol (MIRALAX / GLYCOLAX) packet Take 17 g daily as needed by mouth (for constipation).     . pregabalin (LYRICA) 25 MG capsule Take 1 capsule (25 mg total) by mouth 2 (two) times daily. 60 capsule 3  . simvastatin (ZOCOR) 10 MG tablet Take 10 mg by mouth daily.    . sodium phosphate (FLEET) 7-19 GM/118ML ENEM Place 1 enema rectally as needed for severe constipation. Give Fleet enema per rectum X 1 after removing hard stool or if soft stool is present in rectum.    . traMADol (ULTRAM) 50 MG tablet Take 50 mg by mouth 3 (three) times daily as needed.    . triamcinolone cream (KENALOG) 0.1 % Apply 1 application topically as needed. Apply to itchy areas as needed. May keep in room and self apply     No current facility-administered medications for this visit.      Review of Systems : See HPI for pertinent positives and negatives.  Physical Examination  Vitals:   12/29/17 1102 12/29/17 1114  BP: (!) 116/57 (!) 119/58  Pulse: 84   Resp: 14   Temp: (!) 97 F (36.1 C)   SpO2: 100%   Weight: 96 lb (43.5 kg)   Height: 5\' 2"  (1.575 m)    Body mass index is 17.56 kg/m.  General: WDWN elderly female in NAD GAIT:slow, deliberate, with assistance HENT: No gross abnormalities   Eyes: PERRLA Pulmonary: Respirations are non-labored, adequate air movement, CTAB Cardiac: Irregular rhythm, controlled rate.  VASCULAR EXAM Carotid  Bruits Right Left   Negative Negative   Radial pulses are 2+ palpable and equal. Abdominal aortic pulse is not palpable       LE Pulses Right Left   POPLITEAL not palpable  not palpable   POSTERIOR TIBIAL Not palpable  not  palpable    DORSALIS PEDIS  ANTERIOR TIBIAL 1+ palpable  1+ palpable     Gastrointestinal: soft, nontender, BS WNL, no r/g, no palpable masses. Musculoskeletal: Age appropriate generalized muscle atrophy/wasting. M/S 4/5 throughout, Extremities without ischemic changes, small varicosities in both lower legs.  Skin: No rashes, no ulcers, no cellulitis.  Patch of mildly irritated appearing skin with no open wounds at left lower calf, medial aspect.  Neurologic:  A&O X 3; appropriate affect, sensation is normal; speech is normal, CN 2-12 intact except for mild hearing loss, pain and light touch intact in extremities, motor exam as listed above. Psychiatric: Normal thought content, mood appropriate to clinical situation.     Assessment: Vanessa [name] is a 84 y.o. female whohas a known left internal carotid artery occlusion and minimal right ICA stenosis. She has no history of stroke or  TIA.  She c/o intermittent left lower leg pain x 3 months, not worsening.  I discussed with Dr. Donnetta Hutching pt HPI, normal ABI and left lower leg x-ray results that came with her at her visit on 05-03-17. Clinically it appears there is a low probability of DVT in her left lower leg; even if there was, she cannot take anticoagulants since she has a hx of GI bleeds on these, which had to be stopped, she remains in atrial fib.   She denies dyspnea. Her lung sounds are clear.   Pt states several topicals have been tried for her skin irritation at left lower leg, just proximal to ankle, medial aspect. Consider trying A&D ointment topically.    Stable asymptomatic bilateral carotid artery stenosis.   DATA  Carotid Duplex (12-29-17): Right Carotid: Velocities in the right ICA are consistent with a 1-39% stenosis.    Left Carotid: Evidence consistent with a total occlusion of the left ICA.    Vertebrals: Bilateral vertebral arteries demonstrate antegrade flow.  Subclavians: Normal flow hemodynamics were seen in bilateral subclavian        arteries.  No change compared to the exams on 08-22-13, 09-04-14, 09-10-15, and 09-15-16.      Plan: Follow-up in 1 year with carotid duplex.    I discussed in depth with the patient the nature of atherosclerosis, and emphasized the importance of maximal medical management including strict control of blood pressure, blood glucose, and lipid levels, obtaining regular exercise, and continued cessation of smoking.  The patient is aware that without maximal medical management the underlying atherosclerotic disease process will progress, limiting the benefit of any interventions. The patient was given information about stroke prevention and what symptoms should prompt the patient to seek immediate medical care. Thank you for allowing [place] to participate in this patient's care.  Clemon Chambers, RN, MSN, FNP-C [facility]  [place] Office: [phone]  Clinic Physician: Oneida Alar  12/29/17 11:27 AM

## 2017-12-30 DIAGNOSIS — M6281 Muscle weakness (generalized): Secondary | ICD-10-CM | POA: Diagnosis not present

## 2017-12-30 DIAGNOSIS — R2681 Unsteadiness on feet: Secondary | ICD-10-CM | POA: Diagnosis not present

## 2017-12-30 DIAGNOSIS — I240 Acute coronary thrombosis not resulting in myocardial infarction: Secondary | ICD-10-CM | POA: Diagnosis not present

## 2018-01-02 ENCOUNTER — Other Ambulatory Visit: Payer: Self-pay | Admitting: Physical Medicine and Rehabilitation

## 2018-01-02 DIAGNOSIS — M545 Low back pain, unspecified: Secondary | ICD-10-CM

## 2018-01-05 DIAGNOSIS — M6281 Muscle weakness (generalized): Secondary | ICD-10-CM | POA: Diagnosis not present

## 2018-01-05 DIAGNOSIS — I240 Acute coronary thrombosis not resulting in myocardial infarction: Secondary | ICD-10-CM | POA: Diagnosis not present

## 2018-01-05 DIAGNOSIS — R2681 Unsteadiness on feet: Secondary | ICD-10-CM | POA: Diagnosis not present

## 2018-01-12 DIAGNOSIS — M6281 Muscle weakness (generalized): Secondary | ICD-10-CM | POA: Diagnosis not present

## 2018-01-12 DIAGNOSIS — I240 Acute coronary thrombosis not resulting in myocardial infarction: Secondary | ICD-10-CM | POA: Diagnosis not present

## 2018-01-12 DIAGNOSIS — R2681 Unsteadiness on feet: Secondary | ICD-10-CM | POA: Diagnosis not present

## 2018-01-16 DIAGNOSIS — R2681 Unsteadiness on feet: Secondary | ICD-10-CM | POA: Diagnosis not present

## 2018-01-16 DIAGNOSIS — M6281 Muscle weakness (generalized): Secondary | ICD-10-CM | POA: Diagnosis not present

## 2018-01-16 DIAGNOSIS — I240 Acute coronary thrombosis not resulting in myocardial infarction: Secondary | ICD-10-CM | POA: Diagnosis not present

## 2018-01-17 ENCOUNTER — Ambulatory Visit
Admission: RE | Admit: 2018-01-17 | Discharge: 2018-01-17 | Disposition: A | Discharge status: Home / Self Care | Payer: Medicare Other | Source: Ambulatory Visit | Attending: Physical Medicine and Rehabilitation | Admitting: Physical Medicine and Rehabilitation

## 2018-01-17 DIAGNOSIS — M545 Low back pain, unspecified: Secondary | ICD-10-CM

## 2018-01-18 DIAGNOSIS — M6281 Muscle weakness (generalized): Secondary | ICD-10-CM | POA: Diagnosis not present

## 2018-01-18 DIAGNOSIS — R2681 Unsteadiness on feet: Secondary | ICD-10-CM | POA: Diagnosis not present

## 2018-01-18 DIAGNOSIS — I240 Acute coronary thrombosis not resulting in myocardial infarction: Secondary | ICD-10-CM | POA: Diagnosis not present

## 2018-01-19 DIAGNOSIS — G894 Chronic pain syndrome: Secondary | ICD-10-CM | POA: Diagnosis not present

## 2018-01-23 DIAGNOSIS — I240 Acute coronary thrombosis not resulting in myocardial infarction: Secondary | ICD-10-CM | POA: Diagnosis not present

## 2018-01-23 DIAGNOSIS — M6281 Muscle weakness (generalized): Secondary | ICD-10-CM | POA: Diagnosis not present

## 2018-01-23 DIAGNOSIS — R2681 Unsteadiness on feet: Secondary | ICD-10-CM | POA: Diagnosis not present

## 2018-01-26 DIAGNOSIS — I240 Acute coronary thrombosis not resulting in myocardial infarction: Secondary | ICD-10-CM | POA: Diagnosis not present

## 2018-01-26 DIAGNOSIS — R2681 Unsteadiness on feet: Secondary | ICD-10-CM | POA: Diagnosis not present

## 2018-01-26 DIAGNOSIS — M6281 Muscle weakness (generalized): Secondary | ICD-10-CM | POA: Diagnosis not present

## 2018-01-31 DIAGNOSIS — H348112 Central retinal vein occlusion, right eye, stable: Secondary | ICD-10-CM | POA: Diagnosis not present

## 2018-01-31 DIAGNOSIS — H35351 Cystoid macular degeneration, right eye: Secondary | ICD-10-CM | POA: Diagnosis not present

## 2018-01-31 NOTE — Progress Notes (Addendum)
[facility] Note  02/01/2018     CHIEF COMPLAINT Patient presents for Retina Evaluation   HISTORY OF PRESENT ILLNESS: Vanessa [name] is a 85 y.o. female who presents to the clinic today for:   HPI    Retina Evaluation    In right eye.  This started 4 days ago.  Duration of 4 days.  Associated Symptoms Negative for Flashes, Blind Spot, Photophobia, Scalp Tenderness, Fever, Floaters, Pain, Glare, Jaw Claudication, Weight Loss, Distortion, Redness, Trauma, Shoulder/Hip pain and Fatigue.  Context:  distance vision, mid-range vision and near vision.  Treatments tried include no treatments.  I, the attending physician,  performed the HPI with the patient and updated documentation appropriately.          Comments    Patient states referred by Dr. Katy Fitch. Had sudden loss of vision OD around 01-18- or 01-29-2018 (can't place date exactly). No floaters, flashes.        Last edited by Bernarda Caffey, MD on 02/01/2018  9:47 PM. (History)    pt is by herself today, she states she noticed a change in her vision over the weekend and immediately called Dr. Katy Fitch, pt states several months ago she fell face first on the ground, pt takes medication for BP  Referring physician: [name], MD [address] [place], [place] [address]  HISTORICAL INFORMATION:   Selected notes from the MEDICAL RECORD NUMBER Referred by Dr. [name] for concern of CME LEE: 01.21.20 (RKaty Fitch) [BCVA: OD: OS:] Ocular Hx- PMH-    CURRENT MEDICATIONS: No current outpatient medications on file. (Ophthalmic Drugs)   No current facility-administered medications for this visit.  (Ophthalmic Drugs)   Current Outpatient Medications (Other)  Medication Sig  . acetaminophen (TYLENOL) 500 MG tablet Take 500 mg by mouth 2 (two) times daily.   . Artificial Saliva (SALIVAMAX) PACK Use as directed 1 Package in the mouth or throat. mix with 37mL's of H20; swish and spit BID; may keep in  bathroom; may self administer.  Marland Kitchen aspirin 81 MG chewable tablet Chew 81 mg by mouth daily.  Marland Kitchen atenolol (TENORMIN) 25 MG tablet Take 12.5 mg by mouth 2 (two) times daily. Hold if SBP < 100 or HR < 60  . Biotin 5000 MCG TABS Take 5,000 mcg by mouth daily.  . Calcium-Vitamin D-Vitamin K (VIACTIV) 235-573-22 MG-UNT-MCG CHEW Chew 1 tablet by mouth daily.   . fluticasone (FLONASE) 50 MCG/ACT nasal spray Place 2 sprays into both nostrils daily.  . hydrogen peroxide 1.5 % SOLN Apply 1 application topically as needed (Swish and spit for dry mouth).  Marland Kitchen ipratropium (ATROVENT) 0.06 % nasal spray Place 2 sprays into both nostrils 2 (two) times daily as needed for rhinitis.  Marland Kitchen lactose free nutrition (BOOST PLUS) LIQD Take 237 mLs by mouth 3 (three) times daily with meals. Per request of resident, states can't eat if takes it later  . lidocaine-prilocaine (EMLA) cream Apply 1 application topically as needed.  . Menthol, Topical Analgesic, (BIOFREEZE ROLL-ON) 4 % GEL Apply 1 application topically every 8 (eight) hours. Apply to LLE  . Multiple Vitamins-Minerals (MULTIVITAL) tablet Take 1 tablet by mouth daily.  . ondansetron (ZOFRAN) 4 MG tablet Take 1 tablet (4 mg total) by mouth every 6 (six) hours as needed for nausea.  . polyethylene glycol (MIRALAX / GLYCOLAX) packet Take 17 g daily as needed by mouth (for constipation).   . pregabalin (LYRICA) 25 MG capsule Take 1  capsule (25 mg total) by mouth 2 (two) times daily.  . simvastatin (ZOCOR) 10 MG tablet Take 10 mg by mouth daily.  . sodium phosphate (FLEET) 7-19 GM/118ML ENEM Place 1 enema rectally as needed for severe constipation. Give Fleet enema per rectum X 1 after removing hard stool or if soft stool is present in rectum.  . traMADol (ULTRAM) 50 MG tablet Take 50 mg by mouth 3 (three) times daily as needed.  . traMADol (ULTRAM) 50 MG tablet Take 50 mg by mouth as directed.  . triamcinolone cream (KENALOG) 0.1 % Apply 1 application topically as needed.  Apply to itchy areas as needed. May keep in room and self apply   Current Facility-Administered Medications (Other)  Medication Route  . Bevacizumab (AVASTIN) SOLN 1.25 mg Intravitreal      REVIEW OF SYSTEMS: ROS    Positive for: Musculoskeletal, Cardiovascular, Eyes   Negative for: Constitutional, Gastrointestinal, Neurological, Skin, Genitourinary, HENT, Endocrine, Respiratory, Psychiatric, Allergic/Imm, Heme/Lymph   Last edited by Roselee Nova D on 02/01/2018  9:35 AM. (History)       ALLERGIES No Known Allergies  PAST MEDICAL HISTORY Past Medical History:  Diagnosis Date  . Abdominal bloating   . Atrial fibrillation ([place])   . Bruises easily   . Carotid artery occlusion    LEFT  . Dizziness   . DOE (dyspnea on exertion) 04/02/2014  . Fall at home Sept 2013, Dec. 2013  Jun 08, 2012  . GERD (gastroesophageal reflux disease) 02/11/2015  . Headache(784.0)   . Hoarseness   . Hypercholesterolemia   . Hyperglycemia 04/02/2014   Glucose 178 mg percent on 01/22/2014 04/05/14 Hgb A1c 6.6 Diet controlled.    . Hypothyroidism 04/15/2015  . Neuropathy    PERIPHERAL  . Pruritus   . Scoliosis   . Varicose veins    Past Surgical History:  Procedure Laterality Date  . ABDOMINAL HYSTERECTOMY  1954  . CHOLECYSTECTOMY  1997  . ESOPHAGOGASTRODUODENOSCOPY (EGD) WITH PROPOFOL N/A 11/25/2016   Procedure: ESOPHAGOGASTRODUODENOSCOPY (EGD) WITH PROPOFOL;  Surgeon: [name], MD;  Location: [facility];  Service: Endoscopy;  Laterality: N/A;  . [place]   correct scoliosis    FAMILY HISTORY Family History  Adopted: Yes  Problem Relation Age of Onset  . Diabetes Mother   . Heart attack Mother   . Heart disease Mother        After age 52  . Heart attack Father   . Stroke Father   . Breast cancer Sister   . Heart disease Maternal Grandmother   . Cancer Son        adopted son. esophagus, stomach, liver  . Stomach cancer Neg Hx   . Colon cancer Neg Hx      SOCIAL HISTORY Social History   Tobacco Use  . Smoking status: Never Smoker  . Smokeless tobacco: Never Used  Substance Use Topics  . Alcohol use: No    Alcohol/week: 0.0 standard drinks  . Drug use: No         OPHTHALMIC EXAM:  Base Eye Exam    Visual Acuity (Snellen - Linear)      Right Left   Dist cc 20/200 20/25   Dist ph cc 20/70 20/25 +1   Correction:  Glasses       Tonometry (Tonopen, 9:49 AM)      Right Left   Pressure 12 11       Pupils      Dark Light Shape  React APD   Right 3 2 Round Slow +1   Left 3 2 Round Slow None       Visual Fields (Counting fingers)      Left Right    Full Full       Extraocular Movement      Right Left    Full, Ortho Full, Ortho       Neuro/Psych    Oriented x3:  Yes   Mood/Affect:  Normal       Dilation    Both eyes:  1.0% Mydriacyl, 2.5% Phenylephrine @ 9:49 AM        Slit Lamp and Fundus Exam    Slit Lamp Exam      Right Left   Lids/Lashes Dermatochalasis - upper lid Dermatochalasis - upper lid, Meibomian gland dysfunction   Conjunctiva/Sclera White and quiet White and quiet   Cornea Arcus, 2+ diffuse Punctate epithelial erosions Arcus, 2+ diffuse Punctate epithelial erosions   Anterior Chamber deep and clear deep and clear   Iris Round and moderately dilated to 70mm Round and moderately dilated to 17mm   Lens PC IOL in good position with open PC PC IOL in good position with open PC   Vitreous Vitreous syneresis Vitreous syneresis       Fundus Exam      Right Left   Disc hyperemic, scattered disc hemes Pink and Sharp   C/D Ratio 0.4 0.3   Macula Blunted foveal reflex, central Cystoid macular edema, diffuse IRH consist with CRVO Blunted foveal reflex, Drusen, RPE mottling and clumping, No heme or edema   Vessels Dilated and Tortuous veins, attenuated arterioles, CRVO Vascular attenuation, Tortuous   Periphery Attached, choroidal nevus at 0930 equator w/ overlying drusen, minimal elavation, no orange  pigment, 3DD in diam Attached, reticular degeneration, peripheral cystoid degeneration        Refraction    Wearing Rx      Sphere Cylinder Axis Add   Right -2.75 +2.75 010 +3.25   Left -3.75 +3.25 011 +3.25       Manifest Refraction      Sphere Cylinder Axis Dist VA   Right -2.00 +2.75 010 20/100-2   Left -3.75 +3.25 015 20/25          IMAGING AND PROCEDURES  Imaging and Procedures for @TODAY @  OCT, Retina - OU - Both Eyes       Right Eye Quality was good. Central Foveal Thickness: 571. Progression has no prior data. Findings include abnormal foveal contour, retinal drusen , no SRF, intraretinal fluid.   Left Eye Quality was good. Central Foveal Thickness: 238. Progression has no prior data. Findings include normal foveal contour, no SRF, no IRF, retinal drusen .   Notes *Images captured and stored on drive  Diagnosis / Impression:  OD: CRVO with CME  non-exu ARMD OU   Clinical management:  See below  Abbreviations: NFP - Normal foveal profile. CME - cystoid macular edema. PED - pigment epithelial detachment. IRF - intraretinal fluid. SRF - subretinal fluid. EZ - ellipsoid zone. ERM - epiretinal membrane. ORA - outer retinal atrophy. ORT - outer retinal tubulation. SRHM - subretinal hyper-reflective material        Intravitreal Injection, Pharmacologic Agent - OD - Right Eye       Time Out 02/01/2018. 11:00 AM. Confirmed correct patient, procedure, site, and patient consented.   Anesthesia Topical anesthesia was used. Anesthetic medications included Lidocaine 2%, Proparacaine 0.5%.   Procedure Preparation included 5% betadine  to ocular surface, eyelid speculum. A supplied needle was used.   Injection:  1.25 mg Bevacizumab (AVASTIN) SOLN   NDC: 03704-888-91, Lot: [id number] , Expiration date: 02/21/2018   Route: Intravitreal, Site: Right Eye, Waste: 0 mL  Post-op Post injection exam found visual acuity of at least counting fingers. The patient  tolerated the procedure well. There were no complications. The patient received written and verbal post procedure care education.                 ASSESSMENT/PLAN:    ICD-10-CM   1. Central retinal vein occlusion with macular edema of right eye H34.8110 Intravitreal Injection, Pharmacologic Agent - OD - Right Eye    Bevacizumab (AVASTIN) SOLN 1.25 mg  2. Retinal edema H35.81 OCT, Retina - OU - Both Eyes  3. Intermediate stage nonexudative age-related macular degeneration of both eyes H35.3132   4. Essential hypertension I10   5. Hypertensive retinopathy of both eyes H35.033   6. Nevus of choroid of right eye D31.31   7. Pseudophakia of both eyes Z96.1     1,2. CRVO with CME  - The natural history of retinal vein occlusion and macular edema and treatment options including observation, laser photocoagulation, and intravitreal antiVEGF injection with Avastin and Lucentis and Eylea and intravitreal injection of steroids with triamcinolone and Ozurdex and the complications of these procedures including loss of vision, infection, cataract, glaucoma, and retinal detachment were discussed with patient.  - Specifically discussed findings from [place] / [place] study regarding patient stabilization with anti-VEGF agents and increased potential for visual improvements.  Also discussed need for frequent follow up and potentially multiple injections given the chronic nature of the disease process  - BCVA OD: 20/70, OS: 20/25+1  - OCT shows central CME OD  - recommend IVA OD #1 today, 01.22.20  - pt wishes to proceed with treatment  - RBA of procedure discussed, questions answered  - informed consent obtained and signed  - see procedure note  - F/U 4 weeks -- DFE/OCT/possible injection  3. Age related macular degeneration, non-exudative, intermediate stage OU  - The incidence, anatomy, and pathology of dry AMD, risk of progression, and the AREDS and AREDS 2 study including smoking risks discussed  with patient.  - Recommend amsler grid monitoring  4,5. Hypertensive retinopathy OU  - discussed importance of tight BP control  - monitor  6. Choroidal Nevus OD  - ~3DD in diameter, round, located at 0930 equator  - +drusen; no orange pigment or SRF  - minimal elevation  - will obtain baseline Optos imaging +/- b-scan u/s at next visit  7. Pseudophakia OU  - s/p CE/IOL OU  - beautiful surgeries, doing well  - monitor   Ophthalmic Meds Ordered this visit:  Meds ordered this encounter  Medications  . Bevacizumab (AVASTIN) SOLN 1.25 mg       Return in about 4 weeks (around 03/01/2018) for f/u CRVO OD, DFE, OCT.  There are no Patient Instructions on file for this visit.   Explained the diagnoses, plan, and follow up with the patient and they expressed understanding.  Patient expressed understanding of the importance of proper follow up care.   This document serves as a record of services personally performed by 03/14/2018, MD, PhD. It was created on their behalf by Gardiner Sleeper, OA, an ophthalmic assistant. The creation of this record is the provider's dictation and/or activities during the visit.    Electronically signed by: Ernest Mallick, OA  01.21.2020 10:05  PM    Gardiner Sleeper, M.D., Ph.D. Diseases & Surgery of the Retina and Vitreous [facility]  I have reviewed the above documentation for accuracy and completeness, and I agree with the above. Gardiner Sleeper, M.D., Ph.D. 02/01/18 10:05 PM    Abbreviations: M myopia (nearsighted); A astigmatism; H hyperopia (farsighted); P presbyopia; Mrx spectacle prescription;  CTL contact lenses; OD right eye; OS left eye; OU both eyes  XT exotropia; ET esotropia; PEK punctate epithelial keratitis; PEE punctate epithelial erosions; DES dry eye syndrome; MGD meibomian gland dysfunction; ATs artificial tears; PFAT's preservative free artificial tears; Heber-[place] nuclear sclerotic cataract; PSC posterior  subcapsular cataract; ERM epi-retinal membrane; PVD posterior vitreous detachment; RD retinal detachment; DM diabetes mellitus; DR diabetic retinopathy; NPDR non-proliferative diabetic retinopathy; PDR proliferative diabetic retinopathy; CSME clinically significant macular edema; DME diabetic macular edema; dbh dot blot hemorrhages; CWS cotton wool spot; POAG primary open angle glaucoma; C/D cup-to-disc ratio; HVF humphrey visual field; GVF goldmann visual field; OCT optical coherence tomography; IOP intraocular pressure; BRVO Branch retinal vein occlusion; CRVO central retinal vein occlusion; CRAO central retinal artery occlusion; BRAO branch retinal artery occlusion; RT retinal tear; SB scleral buckle; PPV pars plana vitrectomy; VH Vitreous hemorrhage; PRP panretinal laser photocoagulation; IVK intravitreal kenalog; VMT vitreomacular traction; MH Macular hole;  NVD neovascularization of the disc; NVE neovascularization elsewhere; AREDS age related eye disease study; ARMD age related macular degeneration; POAG primary open angle glaucoma; EBMD epithelial/anterior basement membrane dystrophy; ACIOL anterior chamber intraocular lens; IOL intraocular lens; PCIOL posterior chamber intraocular lens; Phaco/IOL phacoemulsification with intraocular lens placement; Solomon photorefractive keratectomy; LASIK laser assisted in situ keratomileusis; HTN hypertension; DM diabetes mellitus; COPD chronic obstructive pulmonary disease

## 2018-02-01 ENCOUNTER — Ambulatory Visit (INDEPENDENT_AMBULATORY_CARE_PROVIDER_SITE_OTHER): Payer: Medicare Other | Admitting: Ophthalmology

## 2018-02-01 ENCOUNTER — Encounter (INDEPENDENT_AMBULATORY_CARE_PROVIDER_SITE_OTHER): Payer: Self-pay | Admitting: Ophthalmology

## 2018-02-01 DIAGNOSIS — Z961 Presence of intraocular lens: Secondary | ICD-10-CM | POA: Diagnosis not present

## 2018-02-01 DIAGNOSIS — I10 Essential (primary) hypertension: Secondary | ICD-10-CM | POA: Diagnosis not present

## 2018-02-01 DIAGNOSIS — H3581 Retinal edema: Secondary | ICD-10-CM

## 2018-02-01 DIAGNOSIS — H35033 Hypertensive retinopathy, bilateral: Secondary | ICD-10-CM

## 2018-02-01 DIAGNOSIS — H34811 Central retinal vein occlusion, right eye, with macular edema: Secondary | ICD-10-CM | POA: Diagnosis not present

## 2018-02-01 DIAGNOSIS — H353132 Nonexudative age-related macular degeneration, bilateral, intermediate dry stage: Secondary | ICD-10-CM

## 2018-02-01 DIAGNOSIS — D3131 Benign neoplasm of right choroid: Secondary | ICD-10-CM | POA: Diagnosis not present

## 2018-02-01 MED ORDER — BEVACIZUMAB CHEMO INJECTION 1.25MG/0.05ML SYRINGE FOR KALEIDOSCOPE
1.2500 mg | INTRAVITREAL | Status: DC
  Administered 2018-02-01: 1.25 mg via INTRAVITREAL

## 2018-02-02 DIAGNOSIS — R2681 Unsteadiness on feet: Secondary | ICD-10-CM | POA: Diagnosis not present

## 2018-02-02 DIAGNOSIS — I240 Acute coronary thrombosis not resulting in myocardial infarction: Secondary | ICD-10-CM | POA: Diagnosis not present

## 2018-02-02 DIAGNOSIS — M6281 Muscle weakness (generalized): Secondary | ICD-10-CM | POA: Diagnosis not present

## 2018-02-03 DIAGNOSIS — I240 Acute coronary thrombosis not resulting in myocardial infarction: Secondary | ICD-10-CM | POA: Diagnosis not present

## 2018-02-03 DIAGNOSIS — R2681 Unsteadiness on feet: Secondary | ICD-10-CM | POA: Diagnosis not present

## 2018-02-03 DIAGNOSIS — M6281 Muscle weakness (generalized): Secondary | ICD-10-CM | POA: Diagnosis not present

## 2018-02-06 DIAGNOSIS — I240 Acute coronary thrombosis not resulting in myocardial infarction: Secondary | ICD-10-CM | POA: Diagnosis not present

## 2018-02-06 DIAGNOSIS — M6281 Muscle weakness (generalized): Secondary | ICD-10-CM | POA: Diagnosis not present

## 2018-02-06 DIAGNOSIS — R2681 Unsteadiness on feet: Secondary | ICD-10-CM | POA: Diagnosis not present

## 2018-02-07 ENCOUNTER — Encounter: Payer: Self-pay | Admitting: Internal Medicine

## 2018-02-08 DIAGNOSIS — R2681 Unsteadiness on feet: Secondary | ICD-10-CM | POA: Diagnosis not present

## 2018-02-08 DIAGNOSIS — M6281 Muscle weakness (generalized): Secondary | ICD-10-CM | POA: Diagnosis not present

## 2018-02-08 DIAGNOSIS — I240 Acute coronary thrombosis not resulting in myocardial infarction: Secondary | ICD-10-CM | POA: Diagnosis not present

## 2018-02-09 DIAGNOSIS — M6281 Muscle weakness (generalized): Secondary | ICD-10-CM | POA: Diagnosis not present

## 2018-02-09 DIAGNOSIS — I240 Acute coronary thrombosis not resulting in myocardial infarction: Secondary | ICD-10-CM | POA: Diagnosis not present

## 2018-02-09 DIAGNOSIS — R2681 Unsteadiness on feet: Secondary | ICD-10-CM | POA: Diagnosis not present

## 2018-02-13 DIAGNOSIS — M542 Cervicalgia: Secondary | ICD-10-CM | POA: Diagnosis not present

## 2018-02-13 DIAGNOSIS — R293 Abnormal posture: Secondary | ICD-10-CM | POA: Diagnosis not present

## 2018-02-13 DIAGNOSIS — R2681 Unsteadiness on feet: Secondary | ICD-10-CM | POA: Diagnosis not present

## 2018-02-13 DIAGNOSIS — M6281 Muscle weakness (generalized): Secondary | ICD-10-CM | POA: Diagnosis not present

## 2018-02-14 DIAGNOSIS — H34811 Central retinal vein occlusion, right eye, with macular edema: Secondary | ICD-10-CM | POA: Diagnosis not present

## 2018-02-14 DIAGNOSIS — H35351 Cystoid macular degeneration, right eye: Secondary | ICD-10-CM | POA: Diagnosis not present

## 2018-02-28 NOTE — Progress Notes (Addendum)
[facility] Note  03/01/2018     CHIEF COMPLAINT Patient presents for Retina Follow Up   HISTORY OF PRESENT ILLNESS: Vanessa [name] is a 85 y.o. female who presents to the clinic today for:   HPI    Retina Follow Up    Patient presents with  CRVO/BRVO.  In right eye.  Severity is moderate.  Duration of 4 weeks.  Since onset it is stable.  I, the attending physician,  performed the HPI with the patient and updated documentation appropriately.          Comments    Pt presents for 4 week follow up for CRVO OD, pt states her vision is the same as last time, she states she does not feel like her right eye has gotten any better, she states sometimes she can see a little bit out of it and sometime it is just black, pt denies flashes/floaters,       Last edited by Bernarda Caffey, MD on 03/01/2018  2:34 PM. (History)    pt states her vision has not improved since her last visit and injection here, she states she still sees black clouds  Referring physician: [name], [name], NP [place], [place] [address]  HISTORICAL INFORMATION:   Selected notes from the MEDICAL RECORD NUMBER Referred by Dr. [name] for concern of CME LEE: 01.21.20 (R. Groat) [BCVA: OD: OS:] Ocular Hx- PMH-    CURRENT MEDICATIONS: No current outpatient medications on file. (Ophthalmic Drugs)   No current facility-administered medications for this visit.  (Ophthalmic Drugs)   Current Outpatient Medications (Other)  Medication Sig  . acetaminophen (TYLENOL) 500 MG tablet Take 500 mg by mouth 2 (two) times daily.   . Artificial Saliva (SALIVAMAX) PACK Use as directed 1 Package in the mouth or throat. mix with 42mL's of H20; swish and spit BID; may keep in bathroom; may self administer.  Marland Kitchen aspirin 81 MG chewable tablet Chew 81 mg by mouth daily.  Marland Kitchen atenolol (TENORMIN) 25 MG tablet Take 12.5 mg by mouth 2 (two) times daily. Hold if SBP < 100 or HR < 60  . Biotin 5000 MCG  TABS Take 5,000 mcg by mouth daily.  . Calcium-Vitamin D-Vitamin K (VIACTIV) 836-629-47 MG-UNT-MCG CHEW Chew 1 tablet by mouth daily.   . fluticasone (FLONASE) 50 MCG/ACT nasal spray Place 2 sprays into both nostrils daily.  . hydrogen peroxide 1.5 % SOLN Apply 1 application topically as needed (Swish and spit for dry mouth).  Marland Kitchen ipratropium (ATROVENT) 0.06 % nasal spray Place 2 sprays into both nostrils 2 (two) times daily as needed for rhinitis.  Marland Kitchen lactose free nutrition (BOOST PLUS) LIQD Take 237 mLs by mouth 3 (three) times daily with meals. Per request of resident, states can't eat if takes it later  . lidocaine-prilocaine (EMLA) cream Apply 1 application topically as needed.  . loratadine (CLARITIN) 10 MG tablet Take 10 mg by mouth daily as needed for allergies.  . Menthol, Topical Analgesic, (BIOFREEZE ROLL-ON) 4 % GEL Apply 1 application topically every 8 (eight) hours. Apply to LLE  . Multiple Vitamins-Minerals (MULTIVITAL) tablet Take 1 tablet by mouth daily.  . ondansetron (ZOFRAN) 4 MG tablet Take 1 tablet (4 mg total) by mouth every 6 (six) hours as needed for nausea.  . polyethylene glycol (MIRALAX / GLYCOLAX) packet Take 17 g daily as needed by mouth (for constipation).   . pregabalin (LYRICA) 25 MG capsule Take 1 capsule (  25 mg total) by mouth 2 (two) times daily.  . simvastatin (ZOCOR) 10 MG tablet Take 10 mg by mouth daily.  . sodium phosphate (FLEET) 7-19 GM/118ML ENEM Place 1 enema rectally as needed for severe constipation. Give Fleet enema per rectum X 1 after removing hard stool or if soft stool is present in rectum.  . traMADol (ULTRAM) 50 MG tablet Take 50 mg by mouth 3 (three) times daily as needed.  . triamcinolone cream (KENALOG) 0.1 % Apply 1 application topically as needed. Apply to itchy areas as needed. May keep in room and self apply   Current Facility-Administered Medications (Other)  Medication Route  . Bevacizumab (AVASTIN) SOLN 1.25 mg Intravitreal  .  Bevacizumab (AVASTIN) SOLN 1.25 mg Intravitreal      REVIEW OF SYSTEMS: ROS    Positive for: Endocrine, Eyes   Negative for: Constitutional, Gastrointestinal, Neurological, Skin, Genitourinary, Musculoskeletal, HENT, Cardiovascular, Respiratory, Psychiatric, Allergic/Imm, Heme/Lymph   Last edited by Debbrah Alar, COT on 03/01/2018  2:13 PM. (History)       ALLERGIES No Known Allergies  PAST MEDICAL HISTORY Past Medical History:  Diagnosis Date  . Abdominal bloating   . Atrial fibrillation ([place])   . Bruises easily   . Carotid artery occlusion    LEFT  . Dizziness   . DOE (dyspnea on exertion) 04/02/2014  . Fall at home Sept 2013, Dec. 2013  Jun 08, 2012  . GERD (gastroesophageal reflux disease) 02/11/2015  . Headache(784.0)   . Hoarseness   . Hypercholesterolemia   . Hyperglycemia 04/02/2014   Glucose 178 mg percent on 01/22/2014 04/05/14 Hgb A1c 6.6 Diet controlled.    . Hypothyroidism 04/15/2015  . Neuropathy    PERIPHERAL  . Pruritus   . Scoliosis   . Varicose veins    Past Surgical History:  Procedure Laterality Date  . ABDOMINAL HYSTERECTOMY  1954  . CHOLECYSTECTOMY  1997  . ESOPHAGOGASTRODUODENOSCOPY (EGD) WITH PROPOFOL N/A 11/25/2016   Procedure: ESOPHAGOGASTRODUODENOSCOPY (EGD) WITH PROPOFOL;  Surgeon: [name], MD;  Location: [facility];  Service: Endoscopy;  Laterality: N/A;  . [place]   correct scoliosis    FAMILY HISTORY Family History  Adopted: Yes  Problem Relation Age of Onset  . Diabetes Mother   . Heart attack Mother   . Heart disease Mother        After age 75  . Heart attack Father   . Stroke Father   . Breast cancer Sister   . Heart disease Maternal Grandmother   . Cancer Son        adopted son. esophagus, stomach, liver  . Stomach cancer Neg Hx   . Colon cancer Neg Hx     SOCIAL HISTORY Social History   Tobacco Use  . Smoking status: Never Smoker  . Smokeless tobacco: Never Used  Substance Use Topics  .  Alcohol use: No    Alcohol/week: 0.0 standard drinks  . Drug use: No         OPHTHALMIC EXAM:  Base Eye Exam    Visual Acuity (Snellen - Linear)      Right Left   Dist cc 20/400 20/30 -2   Dist ph cc NI 20/25 -1   Correction:  Glasses       Tonometry (Tonopen, 2:24 PM)      Right Left   Pressure 15 15       Pupils      Dark Light Shape React APD   Right  2 1.5 Round Minimal None   Left 2 1.5 Round Minimal None       Visual Fields (Counting fingers)      Left Right    Full        Extraocular Movement      Right Left    Full, Ortho Full, Ortho       Neuro/Psych    Oriented x3:  Yes   Mood/Affect:  Normal       Dilation    Both eyes:  1.0% Mydriacyl, 2.5% Phenylephrine @ 2:24 PM        Slit Lamp and Fundus Exam    Slit Lamp Exam      Right Left   Lids/Lashes Dermatochalasis - upper lid Dermatochalasis - upper lid, Meibomian gland dysfunction   Conjunctiva/Sclera White and quiet White and quiet   Cornea Arcus, 2+ diffuse Punctate epithelial erosions Arcus, 2+ diffuse Punctate epithelial erosions   Anterior Chamber deep and clear deep and clear   Iris Round and moderately dilated to 34mm Round and moderately dilated to 52mm   Lens PC IOL in good position with open PC PC IOL in good position with open PC   Vitreous Vitreous syneresis Vitreous syneresis       Fundus Exam      Right Left   Disc hyperemic, scattered disc hemes Pink and Sharp   C/D Ratio 0.4 0.3   Macula Blunted foveal reflex, central Cystoid macular edema resolved, diffuse IRH consist with CRVO Blunted foveal reflex, Drusen, RPE mottling and clumping, No heme or edema   Vessels Dilated and Tortuous veins, attenuated arterioles, CRVO Vascular attenuation, Tortuous   Periphery Attached, choroidal nevus at 0930 equator w/ overlying drusen, minimal elavation, no orange pigment, 3DD in diam Attached, reticular degeneration, peripheral cystoid degeneration          IMAGING AND PROCEDURES   Imaging and Procedures for @TODAY @  OCT, Retina - OU - Both Eyes       Right Eye Quality was good. Central Foveal Thickness: 230. Progression has improved. Findings include retinal drusen , no SRF, normal foveal contour, no IRF, inner retinal atrophy, outer retinal atrophy, intraretinal hyper-reflective material.   Left Eye Quality was good. Central Foveal Thickness: 239. Progression has been stable. Findings include normal foveal contour, no SRF, no IRF, retinal drusen .   Notes *Images captured and stored on drive  Diagnosis / Impression:  OD: CRVO with interval resolution of IRF; ?RAO component  non-exu ARMD OU   Clinical management:  See below  Abbreviations: NFP - Normal foveal profile. CME - cystoid macular edema. PED - pigment epithelial detachment. IRF - intraretinal fluid. SRF - subretinal fluid. EZ - ellipsoid zone. ERM - epiretinal membrane. ORA - outer retinal atrophy. ORT - outer retinal tubulation. SRHM - subretinal hyper-reflective material        Intravitreal Injection, Pharmacologic Agent - OD - Right Eye       Time Out 03/01/2018. 4:38 PM. Confirmed correct patient, procedure, site, and patient consented.   Anesthesia Topical anesthesia was used. Anesthetic medications included Lidocaine 2%, Proparacaine 0.5%.   Procedure Preparation included 5% betadine to ocular surface, eyelid speculum. A supplied needle was used.   Injection:  1.25 mg Bevacizumab (AVASTIN) SOLN   NDC: 06237-628-31, Lot: [id number] , Expiration date: 05/03/2018   Route: Intravitreal, Site: Right Eye, Waste: 0 mL  Post-op Post injection exam found visual acuity of at least counting fingers. The patient tolerated the procedure well. There were no complications.  The patient received written and verbal post procedure care education.        Fluorescein Angiography Optos (Transit OD)       Right Eye   Progression has no prior data. Early phase findings include delayed filling,  blockage ( delayed venous filling, arterial filling ok). Mid/Late phase findings include blockage, staining, vascular perfusion defect.   Left Eye   Progression has no prior data. Early phase findings include normal observations.   Notes Images stored on drive;   Impression: OD: CRVO with delayed venous filling; no active leakage OS: normal study                 ASSESSMENT/PLAN:    ICD-10-CM   1. Central retinal vein occlusion with macular edema of right eye H34.8110 Intravitreal Injection, Pharmacologic Agent - OD - Right Eye    Fluorescein Angiography Optos (Transit OD)    Bevacizumab (AVASTIN) SOLN 1.25 mg  2. Retinal edema H35.81 OCT, Retina - OU - Both Eyes  3. Intermediate stage nonexudative age-related macular degeneration of both eyes H35.3132   4. Essential hypertension I10   5. Hypertensive retinopathy of both eyes H35.033 Fluorescein Angiography Optos (Transit OD)  6. Nevus of choroid of right eye D31.31   7. Pseudophakia of both eyes Z96.1     1,2. CRVO with CME  - s/p IVA OD #1 (01.22.20)  - BCVA OD: 20/400 (worse), OS: 20/25+1  - OCT shows interval resolution of central CME OD  - FA 2.19.2020 shows delayed filling time OD, no active leakage  - recommend IVA OD #2 today, 02.18.20  - pt wishes to proceed with treatment  - RBA of procedure discussed, questions answered  - informed consent obtained and signed  - see procedure note  - F/U 4 weeks -- DFE/OCT/possible injection  3. Age related macular degeneration, non-exudative, intermediate stage OU  - The incidence, anatomy, and pathology of dry AMD, risk of progression, and the AREDS and AREDS 2 study including smoking risks discussed with patient.  - recommend amsler grid monitoring  4,5. Hypertensive retinopathy OU  - discussed importance of tight BP control  - monitor  6. Choroidal Nevus OD  - ~3DD in diameter, round, located at 0930 equator  - +drusen; no orange pigment or SRF  - minimal  elevation  - baseline Optos imaging obtained 2.19.2020  7. Pseudophakia OU  - s/p CE/IOL OU  - beautiful surgeries, doing well  - monitor   Ophthalmic Meds Ordered this visit:  Meds ordered this encounter  Medications  . Bevacizumab (AVASTIN) SOLN 1.25 mg       Return in about 4 weeks (around 03/29/2018) for f/u CRVO OD -- Dilated Exam, OCT, Possible Injxn.  There are no Patient Instructions on file for this visit.   Explained the diagnoses, plan, and follow up with the patient and they expressed understanding.  Patient expressed understanding of the importance of proper follow up care.   This document serves as a record of services personally performed by Gardiner Sleeper, MD, PhD. It was created on their behalf by Ernest Mallick, OA, an ophthalmic assistant. The creation of this record is the provider's dictation and/or activities during the visit.    Electronically signed by: Ernest Mallick, OA  02.18.2020 5:18 PM    Gardiner Sleeper, M.D., Ph.D. Diseases & Surgery of the Retina and Vitreous [facility]  I have reviewed the above documentation for accuracy and completeness, and I agree with the  above. Gardiner Sleeper, M.D., Ph.D. 03/01/18 5:19 PM    Abbreviations: M myopia (nearsighted); A astigmatism; H hyperopia (farsighted); P presbyopia; Mrx spectacle prescription;  CTL contact lenses; OD right eye; OS left eye; OU both eyes  XT exotropia; ET esotropia; PEK punctate epithelial keratitis; PEE punctate epithelial erosions; DES dry eye syndrome; MGD meibomian gland dysfunction; ATs artificial tears; PFAT's preservative free artificial tears; [place] nuclear sclerotic cataract; PSC posterior subcapsular cataract; ERM epi-retinal membrane; PVD posterior vitreous detachment; RD retinal detachment; DM diabetes mellitus; DR diabetic retinopathy; NPDR non-proliferative diabetic retinopathy; PDR proliferative diabetic retinopathy; CSME clinically significant macular  edema; DME diabetic macular edema; dbh dot blot hemorrhages; CWS cotton wool spot; POAG primary open angle glaucoma; C/D cup-to-disc ratio; HVF humphrey visual field; GVF goldmann visual field; OCT optical coherence tomography; IOP intraocular pressure; BRVO Branch retinal vein occlusion; CRVO central retinal vein occlusion; CRAO central retinal artery occlusion; BRAO branch retinal artery occlusion; RT retinal tear; SB scleral buckle; PPV pars plana vitrectomy; VH Vitreous hemorrhage; PRP panretinal laser photocoagulation; IVK intravitreal kenalog; VMT vitreomacular traction; MH Macular hole;  NVD neovascularization of the disc; NVE neovascularization elsewhere; AREDS age related eye disease study; ARMD age related macular degeneration; POAG primary open angle glaucoma; EBMD epithelial/anterior basement membrane dystrophy; ACIOL anterior chamber intraocular lens; IOL intraocular lens; PCIOL posterior chamber intraocular lens; Phaco/IOL phacoemulsification with intraocular lens placement; [place] photorefractive keratectomy; LASIK laser assisted in situ keratomileusis; HTN hypertension; DM diabetes mellitus; COPD chronic obstructive pulmonary disease

## 2018-03-01 ENCOUNTER — Ambulatory Visit (INDEPENDENT_AMBULATORY_CARE_PROVIDER_SITE_OTHER): Payer: Medicare Other | Admitting: Ophthalmology

## 2018-03-01 ENCOUNTER — Encounter (INDEPENDENT_AMBULATORY_CARE_PROVIDER_SITE_OTHER): Payer: Self-pay | Admitting: Ophthalmology

## 2018-03-01 DIAGNOSIS — H34811 Central retinal vein occlusion, right eye, with macular edema: Secondary | ICD-10-CM | POA: Diagnosis not present

## 2018-03-01 DIAGNOSIS — R293 Abnormal posture: Secondary | ICD-10-CM | POA: Diagnosis not present

## 2018-03-01 DIAGNOSIS — Z961 Presence of intraocular lens: Secondary | ICD-10-CM | POA: Diagnosis not present

## 2018-03-01 DIAGNOSIS — H35033 Hypertensive retinopathy, bilateral: Secondary | ICD-10-CM

## 2018-03-01 DIAGNOSIS — H353132 Nonexudative age-related macular degeneration, bilateral, intermediate dry stage: Secondary | ICD-10-CM | POA: Diagnosis not present

## 2018-03-01 DIAGNOSIS — D3131 Benign neoplasm of right choroid: Secondary | ICD-10-CM

## 2018-03-01 DIAGNOSIS — H3581 Retinal edema: Secondary | ICD-10-CM

## 2018-03-01 DIAGNOSIS — M6281 Muscle weakness (generalized): Secondary | ICD-10-CM | POA: Diagnosis not present

## 2018-03-01 DIAGNOSIS — I10 Essential (primary) hypertension: Secondary | ICD-10-CM

## 2018-03-01 DIAGNOSIS — R2681 Unsteadiness on feet: Secondary | ICD-10-CM | POA: Diagnosis not present

## 2018-03-01 DIAGNOSIS — M542 Cervicalgia: Secondary | ICD-10-CM | POA: Diagnosis not present

## 2018-03-01 MED ORDER — BEVACIZUMAB CHEMO INJECTION 1.25MG/0.05ML SYRINGE FOR KALEIDOSCOPE
1.2500 mg | INTRAVITREAL | Status: DC
  Administered 2018-03-01: 1.25 mg via INTRAVITREAL

## 2018-03-02 ENCOUNTER — Non-Acute Institutional Stay: Payer: Medicare Other | Admitting: Family

## 2018-03-02 ENCOUNTER — Encounter: Payer: Self-pay | Admitting: Family

## 2018-03-02 DIAGNOSIS — M8949 Other hypertrophic osteoarthropathy, multiple sites: Secondary | ICD-10-CM

## 2018-03-02 DIAGNOSIS — M542 Cervicalgia: Secondary | ICD-10-CM | POA: Diagnosis not present

## 2018-03-02 DIAGNOSIS — E440 Moderate protein-calorie malnutrition: Secondary | ICD-10-CM

## 2018-03-02 DIAGNOSIS — I739 Peripheral vascular disease, unspecified: Secondary | ICD-10-CM | POA: Diagnosis not present

## 2018-03-02 DIAGNOSIS — M150 Primary generalized (osteo)arthritis: Secondary | ICD-10-CM

## 2018-03-02 DIAGNOSIS — G629 Polyneuropathy, unspecified: Secondary | ICD-10-CM | POA: Diagnosis not present

## 2018-03-02 DIAGNOSIS — I482 Chronic atrial fibrillation, unspecified: Secondary | ICD-10-CM

## 2018-03-02 DIAGNOSIS — R293 Abnormal posture: Secondary | ICD-10-CM | POA: Diagnosis not present

## 2018-03-02 DIAGNOSIS — M159 Polyosteoarthritis, unspecified: Secondary | ICD-10-CM

## 2018-03-02 DIAGNOSIS — R2681 Unsteadiness on feet: Secondary | ICD-10-CM | POA: Diagnosis not present

## 2018-03-02 DIAGNOSIS — M6281 Muscle weakness (generalized): Secondary | ICD-10-CM | POA: Diagnosis not present

## 2018-03-02 NOTE — Progress Notes (Signed)
Location:  [place] Room Number: [place] of Service:  ALF [phone]) Provider: Pearlee Arvizu FNP-C   [name], MD  Patient Care Team: [name], MD as PCP - General (Internal Medicine) Angelia Mould, MD as Attending Physician (Vascular Surgery) [name], MD (Inactive) as Attending Physician (Obstetrics and Gynecology) [name], MD (Orthopedic Surgery) [name], MD (Ophthalmology) [name], MD as Consulting Physician (Urology) [name], MD as Consulting Physician (Cardiology) [name], PA-C as Physician Assistant (Otolaryngology) [name], MD as Consulting Physician (Otolaryngology) [name], MD as Consulting Physician (Dermatology) [name], MD as Consulting Physician (Physical Medicine and Rehabilitation) [name], MD as Attending Physician (Gastroenterology) Mast, Man X, NP as Nurse Practitioner (Internal Medicine) Kathrynn Ducking, MD as Consulting Physician (Neurology) Debara Pickett Nadean Corwin, MD as Consulting Physician (Cardiology) Druscilla Brownie, MD as Consulting Physician (Dermatology)  Extended Emergency Contact Information Primary Emergency Contact: [name] of [place] Phone: [phone] Mobile Phone: [phone] Relation: Son Secondary Emergency Contact: [name] Mobile Phone: [phone] Relation: Friend  Code Status: Full Code  Goals of care: Advanced Directive information Advanced Directives 03/02/2018  Does Patient Have a Medical Advance Directive? No  Type of Advance Directive -  Does patient want to make changes to medical advance directive? -  Copy of [place] in Chart? -  Would patient like information on creating a medical advance directive? No - Patient declined     Chief Complaint  Patient presents with  . Medical Management of Chronic Issues    Routine Visit    HPI:  Pt is a 85 y.o. female seen today  [place] for medical management of chronic diseases.she is seen in her room today.she states no recent fall episode or acute illness.no recent Hospital visit.she has gained weight but would prefer to remain in previous weight 90's.she was on boost supplement three times daily but states now just drinks twice daily.she jokes that her pants don't fit her anymore.Currently weight 102 lbs with a BMI 18.66 indicating underweight.   she complains of constant neck pain states working with physical Therapy.she had tens unit with Therapy prior to visit.she states has had treatment two treatments which seems to ease the pain for a short while.Also has chronic lower back pain without radiation.back pain worst with walking but gets better with rest.Tramadol 50 mg tablet three times daily as needed and Extra strength Tylenol has been helpful.  Chronic Afib - she denies any palpitation or chest pain.Not a candidate for anticoagulant.currently on Atenolol 12.5 mg tablet twice daily and ASA 81 mg tablet daily.   Neuropathy/PVD- on Pregabalin 25 mg tablet twice daily.No worsening of symptoms.varicose vein worst on right leg.continues to follow up with vascular specialist.   ARMD - she states worsening right eye blurry vision.Has follows up with Ophthalmology in 4 weeks.Wears corrective lens.    Past Medical History:  Diagnosis Date  . Abdominal bloating   . Atrial fibrillation ([place])   . Bruises easily   . Carotid artery occlusion    LEFT  . Dizziness   . DOE (dyspnea on exertion) 04/02/2014  . Fall at home Sept 2013, Dec. 2013  Jun 08, 2012  . GERD (gastroesophageal reflux disease) 02/11/2015  . Headache(784.0)   . Hoarseness   . Hypercholesterolemia   . Hyperglycemia 04/02/2014   Glucose 178 mg percent on 01/22/2014 04/05/14 Hgb A1c 6.6 Diet controlled.    . Hypothyroidism 04/15/2015  .  Neuropathy    PERIPHERAL  . Pruritus   . Scoliosis   . Varicose veins    Past Surgical History:  Procedure  Laterality Date  . ABDOMINAL HYSTERECTOMY  1954  . CHOLECYSTECTOMY  1997  . ESOPHAGOGASTRODUODENOSCOPY (EGD) WITH PROPOFOL N/A 11/25/2016   Procedure: ESOPHAGOGASTRODUODENOSCOPY (EGD) WITH PROPOFOL;  Surgeon: [name], MD;  Location: [facility];  Service: Endoscopy;  Laterality: N/A;  . [place]   correct scoliosis    No Known Allergies  Allergies as of 03/02/2018   No Known Allergies     Medication List       Accurate as of March 02, 2018  3:34 PM. Always use your most recent med list.        acetaminophen 500 MG tablet Commonly known as:  TYLENOL Take 500 mg by mouth 2 (two) times daily.   aspirin 81 MG chewable tablet Chew 81 mg by mouth daily.   atenolol 25 MG tablet Commonly known as:  TENORMIN Take 12.5 mg by mouth 2 (two) times daily. Hold if SBP < 100 or HR < 60   BIOFREEZE ROLL-ON 4 % Gel Generic drug:  Menthol (Topical Analgesic) Apply 1 application topically every 8 (eight) hours. Apply to LLE   Biotin 5000 MCG Tabs Take 5,000 mcg by mouth daily.   fluticasone 50 MCG/ACT nasal spray Commonly known as:  FLONASE Place 2 sprays into both nostrils daily.   hydrogen peroxide 1.5 % Soln Apply 1 application topically as needed (Swish and spit for dry mouth).   ipratropium 0.06 % nasal spray Commonly known as:  ATROVENT Place 2 sprays into both nostrils 2 (two) times daily as needed for rhinitis.   lactose free nutrition Liqd Take 237 mLs by mouth 3 (three) times daily with meals. Per request of resident, states can't eat if takes it later   lidocaine-prilocaine cream Commonly known as:  EMLA Apply 1 application topically as needed.   loratadine 10 MG tablet Commonly known as:  CLARITIN Take 10 mg by mouth daily as needed for allergies.   MULTIVITAL tablet Take 1 tablet by mouth daily.   ondansetron 4 MG tablet Commonly known as:  ZOFRAN Take 1 tablet (4 mg total) by mouth every 6 (six) hours as needed for nausea.     polyethylene glycol packet Commonly known as:  MIRALAX / GLYCOLAX Take 17 g daily as needed by mouth (for constipation).   pregabalin 25 MG capsule Commonly known as:  LYRICA Take 1 capsule (25 mg total) by mouth 2 (two) times daily.   SALIVAMAX Pack Use as directed 1 Package in the mouth or throat. mix with 53mL's of H20; swish and spit BID; may keep in bathroom; may self administer.   sodium phosphate 7-19 GM/118ML Enem Place 1 enema rectally as needed for severe constipation. Give Fleet enema per rectum X 1 after removing hard stool or if soft stool is present in rectum.   traMADol 50 MG tablet Commonly known as:  ULTRAM Take 50 mg by mouth 3 (three) times daily as needed.   triamcinolone cream 0.1 % Commonly known as:  KENALOG Apply 1 application topically as needed. Apply to itchy areas as needed. May keep in room and self apply   VIACTIV 500-500-40 MG-UNT-MCG Chew Generic drug:  Calcium-Vitamin D-Vitamin K Chew 1 tablet by mouth daily.       Review of Systems  Constitutional: Negative for activity change, chills, fatigue and fever.  HENT: Positive for hearing loss. Negative  for congestion, postnasal drip, rhinorrhea, sinus pressure, sinus pain, sneezing and sore throat.   Eyes: Positive for visual disturbance. Negative for pain, discharge, redness and itching.       Worsening right eye blurry vision follows up with Ophthalmology.  Respiratory: Negative for cough, chest tightness, shortness of breath and wheezing.   Cardiovascular: Negative for chest pain, palpitations and leg swelling.  Gastrointestinal: Negative for abdominal distention, abdominal pain, constipation, diarrhea, nausea and vomiting.  Endocrine: Negative for cold intolerance, heat intolerance, polydipsia, polyphagia and polyuria.  Genitourinary: Negative for dysuria, flank pain, frequency and urgency.  Musculoskeletal: Positive for arthralgias, back pain and gait problem.  Skin: Negative for color  change, pallor and rash.  Neurological: Negative for dizziness, light-headedness and headaches.       Neuropathy  Hematological: Does not bruise/bleed easily.  Psychiatric/Behavioral: Negative for agitation, confusion and sleep disturbance. The patient is not nervous/anxious.     Immunization History  Administered Date(s) Administered  . Influenza Split 10/20/2011, 11/03/2012  . Influenza,inj,Quad PF,6+ Mos 09/29/2012, 11/09/2013  . Influenza-Unspecified 10/17/2014, 10/23/2015, 10/20/2016, 10/17/2017  . PPD Test 08/21/2013, 03/04/2014  . Pneumococcal Conjugate-13 08/21/2013  . Pneumococcal Polysaccharide-23 09/08/2016  . Tdap 05/20/2008  . Zoster 11/01/2012   Pertinent  Health Maintenance Due  Topic Date Due  . INFLUENZA VACCINE  Completed  . DEXA SCAN  Completed  . PNA vac Low Risk Adult  Completed   Fall Risk  11/04/2017 09/05/2017 09/22/2016 08/25/2016 05/31/2016  Falls in the past year? Yes Yes Yes Yes No  Number falls in past yr: 2 or more 2 or more 1 2 or more -  Comment - - - - -  Injury with Fall? Yes Yes Yes Yes -  Comment - - fx 3 ribs - -  Risk Factor Category  - - - - -  Risk for fall due to : - - Impaired mobility;Impaired balance/gait - -  Risk for fall due to: Comment - - - - -    Vitals:   03/02/18 0900  BP: 129/90  Pulse: 84  Resp: 20  Temp: 97.8 F (36.6 C)  TempSrc: Oral  SpO2: 95%  Weight: 102 lb (46.3 kg)  Height: 5\' 2"  (1.575 m)   Body mass index is 18.66 kg/m. Physical Exam Constitutional:      Appearance: She is underweight.  HENT:     Head: Normocephalic.     Nose: Nose normal. No congestion or rhinorrhea.     Mouth/Throat:     Mouth: Mucous membranes are moist.     Pharynx: Oropharynx is clear. No oropharyngeal exudate or posterior oropharyngeal erythema.  Eyes:     General: No scleral icterus.       Right eye: No discharge.        Left eye: No discharge.     Extraocular Movements: Extraocular movements intact.      Conjunctiva/sclera: Conjunctivae normal.     Pupils: Pupils are equal, round, and reactive to light.     Comments: Corrective lens in place   Neck:     Musculoskeletal: No muscular tenderness.     Vascular: No carotid bruit.     Comments: Neck limited ROM due to pain.   Cardiovascular:     Rate and Rhythm: Normal rate and regular rhythm.     Pulses: Normal pulses.     Heart sounds: Murmur present. No friction rub. No gallop.   Pulmonary:     Effort: Pulmonary effort is normal. No respiratory distress.  Breath sounds: Normal breath sounds. No wheezing, rhonchi or rales.  Chest:     Chest wall: No tenderness.  Abdominal:     General: Bowel sounds are normal. There is no distension.     Palpations: Abdomen is soft. There is no mass.     Tenderness: There is no abdominal tenderness. There is no right CVA tenderness, left CVA tenderness, guarding or rebound.  Musculoskeletal:        General: No swelling or tenderness.     Right lower leg: No edema.     Left lower leg: No edema.     Comments: Unsteady gait ambulates with walker.  Lymphadenopathy:     Cervical: No cervical adenopathy.  Skin:    General: Skin is warm and dry.     Coloration: Skin is not pale.     Findings: No bruising, erythema or rash.  Neurological:     Mental Status: She is alert and oriented to person, place, and time.     Coordination: Coordination normal.     Gait: Gait abnormal.  Psychiatric:        Mood and Affect: Mood normal.        Speech: Speech normal.        Behavior: Behavior normal.        Thought Content: Thought content normal.        Judgment: Judgment normal.    Labs reviewed: Recent Labs    03/07/17 08/04/17  NA 140  140 140  K 4.9  4.9 4.2  CL  --  103  CO2  --  29  BUN 14 17  CREATININE 0.8  0.77 0.8  CALCIUM 9.1 9.3   Recent Labs    03/07/17 08/04/17  AST 22  22 24   ALT 13  13 13   ALKPHOS 71  71 67  BILITOT 0.6  --   PROT 5.7 5.9  ALBUMIN 3.3  --    Recent Labs     03/07/17 08/04/17  WBC 6.9 6.2  HGB 11.9 12.2  HCT 34 35*  PLT  --  198   Lab Results  Component Value Date   TSH 3.44 08/04/2017   Lab Results  Component Value Date   HGBA1C 6.4 07/07/2015   Lab Results  Component Value Date   CHOL 187 11/03/2017   HDL 84 (A) 11/03/2017   LDLCALC 80 11/03/2017   TRIG 136 11/03/2017   CHOLHDL 2.1 07/29/2016    Significant Diagnostic Results in last 30 days:  No results found.  Assessment/Plan 1. Primary osteoarthritis involving multiple joints Continue on tens unit treatment,Tramadol 50 mg tablet three times daily as needed and Extra strength Tylenol 500 mg tablet twice daily.   2. Chronic atrial fibrillation HR controlled.continue on ASA 81 mg tablet daily.check CBC/diff,lipid and TSH level 03/06/2018.  3. Neuropathy Continue on Pregabalin 25 mg capsule twice daily.  4. Moderate protein-calorie malnutrition ([place]) Has gained weight but still under weight BMI 18.66 continue on Boost supplement 237 mls twice daily.check CMP 03/06/2018   5. PVD (peripheral vascular disease) ([facility]) No ulceration or edema.continue to follow up with vascular specialist as directed.  Family/ staff Communication: Reviewed plan of care with patient and facility Nurse.   Labs/tests ordered:  CBC/diff,CMP,Lipid panel  and TSH level 03/06/2018.   Sandrea Hughs, NP

## 2018-03-06 ENCOUNTER — Telehealth (INDEPENDENT_AMBULATORY_CARE_PROVIDER_SITE_OTHER): Payer: Self-pay

## 2018-03-06 DIAGNOSIS — M542 Cervicalgia: Secondary | ICD-10-CM | POA: Diagnosis not present

## 2018-03-06 DIAGNOSIS — M6281 Muscle weakness (generalized): Secondary | ICD-10-CM | POA: Diagnosis not present

## 2018-03-06 DIAGNOSIS — R293 Abnormal posture: Secondary | ICD-10-CM | POA: Diagnosis not present

## 2018-03-06 DIAGNOSIS — R2681 Unsteadiness on feet: Secondary | ICD-10-CM | POA: Diagnosis not present

## 2018-03-06 DIAGNOSIS — D649 Anemia, unspecified: Secondary | ICD-10-CM | POA: Diagnosis not present

## 2018-03-06 LAB — HEPATIC FUNCTION PANEL
ALT: 12 (ref 7–35)
AST: 21 (ref 13–35)

## 2018-03-06 LAB — CBC AND DIFFERENTIAL
HCT: 36 (ref 36–46)
Hemoglobin: 12.4 (ref 12.0–16.0)
Platelets: 217 (ref 150–399)

## 2018-03-06 LAB — BASIC METABOLIC PANEL
BUN: 17 (ref 4–21)
Creatinine: 0.8 (ref 0.5–1.1)
Glucose: 74

## 2018-03-08 DIAGNOSIS — R2681 Unsteadiness on feet: Secondary | ICD-10-CM | POA: Diagnosis not present

## 2018-03-08 DIAGNOSIS — H353212 Exudative age-related macular degeneration, right eye, with inactive choroidal neovascularization: Secondary | ICD-10-CM | POA: Diagnosis not present

## 2018-03-08 DIAGNOSIS — M542 Cervicalgia: Secondary | ICD-10-CM | POA: Diagnosis not present

## 2018-03-08 DIAGNOSIS — M6281 Muscle weakness (generalized): Secondary | ICD-10-CM | POA: Diagnosis not present

## 2018-03-08 DIAGNOSIS — H35351 Cystoid macular degeneration, right eye: Secondary | ICD-10-CM | POA: Diagnosis not present

## 2018-03-08 DIAGNOSIS — H34811 Central retinal vein occlusion, right eye, with macular edema: Secondary | ICD-10-CM | POA: Diagnosis not present

## 2018-03-08 DIAGNOSIS — R293 Abnormal posture: Secondary | ICD-10-CM | POA: Diagnosis not present

## 2018-03-09 ENCOUNTER — Other Ambulatory Visit: Payer: Self-pay

## 2018-03-09 MED ORDER — TRAMADOL HCL 50 MG PO TABS: 50.0000 mg | ORAL_TABLET | Freq: Three times a day (TID) | ORAL | 3 refills | Status: DC | PRN

## 2018-03-10 DIAGNOSIS — M542 Cervicalgia: Secondary | ICD-10-CM | POA: Diagnosis not present

## 2018-03-10 DIAGNOSIS — R2681 Unsteadiness on feet: Secondary | ICD-10-CM | POA: Diagnosis not present

## 2018-03-10 DIAGNOSIS — M6281 Muscle weakness (generalized): Secondary | ICD-10-CM | POA: Diagnosis not present

## 2018-03-10 DIAGNOSIS — R293 Abnormal posture: Secondary | ICD-10-CM | POA: Diagnosis not present

## 2018-03-13 DIAGNOSIS — I240 Acute coronary thrombosis not resulting in myocardial infarction: Secondary | ICD-10-CM | POA: Diagnosis not present

## 2018-03-13 DIAGNOSIS — R293 Abnormal posture: Secondary | ICD-10-CM | POA: Diagnosis not present

## 2018-03-13 DIAGNOSIS — M542 Cervicalgia: Secondary | ICD-10-CM | POA: Diagnosis not present

## 2018-03-15 ENCOUNTER — Other Ambulatory Visit: Payer: Self-pay | Admitting: *Deleted

## 2018-03-15 DIAGNOSIS — M542 Cervicalgia: Secondary | ICD-10-CM | POA: Diagnosis not present

## 2018-03-15 DIAGNOSIS — M961 Postlaminectomy syndrome, not elsewhere classified: Secondary | ICD-10-CM | POA: Diagnosis not present

## 2018-03-15 DIAGNOSIS — M5136 Other intervertebral disc degeneration, lumbar region: Secondary | ICD-10-CM | POA: Diagnosis not present

## 2018-03-15 DIAGNOSIS — R293 Abnormal posture: Secondary | ICD-10-CM | POA: Diagnosis not present

## 2018-03-15 DIAGNOSIS — I240 Acute coronary thrombosis not resulting in myocardial infarction: Secondary | ICD-10-CM | POA: Diagnosis not present

## 2018-03-15 DIAGNOSIS — M5416 Radiculopathy, lumbar region: Secondary | ICD-10-CM | POA: Diagnosis not present

## 2018-03-15 DIAGNOSIS — G894 Chronic pain syndrome: Secondary | ICD-10-CM | POA: Diagnosis not present

## 2018-03-15 MED ORDER — TRAMADOL HCL 50 MG PO TABS: 50.0000 mg | ORAL_TABLET | Freq: Three times a day (TID) | ORAL | 0 refills | Status: DC | PRN

## 2018-03-21 DIAGNOSIS — I240 Acute coronary thrombosis not resulting in myocardial infarction: Secondary | ICD-10-CM | POA: Diagnosis not present

## 2018-03-21 DIAGNOSIS — R293 Abnormal posture: Secondary | ICD-10-CM | POA: Diagnosis not present

## 2018-03-21 DIAGNOSIS — M542 Cervicalgia: Secondary | ICD-10-CM | POA: Diagnosis not present

## 2018-03-23 DIAGNOSIS — I240 Acute coronary thrombosis not resulting in myocardial infarction: Secondary | ICD-10-CM | POA: Diagnosis not present

## 2018-03-23 DIAGNOSIS — R293 Abnormal posture: Secondary | ICD-10-CM | POA: Diagnosis not present

## 2018-03-23 DIAGNOSIS — M542 Cervicalgia: Secondary | ICD-10-CM | POA: Diagnosis not present

## 2018-03-27 DIAGNOSIS — R293 Abnormal posture: Secondary | ICD-10-CM | POA: Diagnosis not present

## 2018-03-27 DIAGNOSIS — M542 Cervicalgia: Secondary | ICD-10-CM | POA: Diagnosis not present

## 2018-03-27 DIAGNOSIS — I240 Acute coronary thrombosis not resulting in myocardial infarction: Secondary | ICD-10-CM | POA: Diagnosis not present

## 2018-03-29 ENCOUNTER — Encounter (INDEPENDENT_AMBULATORY_CARE_PROVIDER_SITE_OTHER): Payer: Medicare Other | Admitting: Ophthalmology

## 2018-03-30 ENCOUNTER — Emergency Department (HOSPITAL_COMMUNITY): Payer: Medicare Other

## 2018-03-30 ENCOUNTER — Emergency Department (HOSPITAL_COMMUNITY)
Admission: EM | Admit: 2018-03-30 | Discharge: 2018-03-30 | Disposition: A | Discharge status: Home / Self Care | Payer: Medicare Other | Attending: Emergency Medicine | Admitting: Emergency Medicine

## 2018-03-30 ENCOUNTER — Other Ambulatory Visit: Payer: Self-pay

## 2018-03-30 DIAGNOSIS — R4182 Altered mental status, unspecified: Secondary | ICD-10-CM | POA: Diagnosis present

## 2018-03-30 DIAGNOSIS — J90 Pleural effusion, not elsewhere classified: Secondary | ICD-10-CM | POA: Diagnosis not present

## 2018-03-30 DIAGNOSIS — R4020 Unspecified coma: Secondary | ICD-10-CM | POA: Diagnosis not present

## 2018-03-30 DIAGNOSIS — Z7982 Long term (current) use of aspirin: Secondary | ICD-10-CM | POA: Diagnosis not present

## 2018-03-30 DIAGNOSIS — R0902 Hypoxemia: Secondary | ICD-10-CM | POA: Diagnosis not present

## 2018-03-30 DIAGNOSIS — R55 Syncope and collapse: Secondary | ICD-10-CM | POA: Diagnosis not present

## 2018-03-30 DIAGNOSIS — Z79899 Other long term (current) drug therapy: Secondary | ICD-10-CM | POA: Insufficient documentation

## 2018-03-30 DIAGNOSIS — Z7401 Bed confinement status: Secondary | ICD-10-CM | POA: Diagnosis not present

## 2018-03-30 DIAGNOSIS — I959 Hypotension, unspecified: Secondary | ICD-10-CM | POA: Diagnosis not present

## 2018-03-30 DIAGNOSIS — I4891 Unspecified atrial fibrillation: Secondary | ICD-10-CM | POA: Diagnosis not present

## 2018-03-30 DIAGNOSIS — M2550 Pain in unspecified joint: Secondary | ICD-10-CM | POA: Diagnosis not present

## 2018-03-30 LAB — CBC
HCT: 40.1 % (ref 36.0–46.0)
Hemoglobin: 13.2 g/dL (ref 12.0–15.0)
MCH: 33.8 pg (ref 26.0–34.0)
MCHC: 32.9 g/dL (ref 30.0–36.0)
MCV: 102.6 fL — ABNORMAL HIGH (ref 80.0–100.0)
Platelets: 196 10*3/uL (ref 150–400)
RBC: 3.91 MIL/uL (ref 3.87–5.11)
RDW: 13.5 % (ref 11.5–15.5)
WBC: 8.1 10*3/uL (ref 4.0–10.5)
nRBC: 0 % (ref 0.0–0.2)

## 2018-03-30 LAB — COMPREHENSIVE METABOLIC PANEL
ALT: 16 U/L (ref 0–44)
AST: 30 U/L (ref 15–41)
Albumin: 3.6 g/dL (ref 3.5–5.0)
Alkaline Phosphatase: 77 U/L (ref 38–126)
Anion gap: 8 (ref 5–15)
BUN: 16 mg/dL (ref 8–23)
CO2: 26 mmol/L (ref 22–32)
Calcium: 9.7 mg/dL (ref 8.9–10.3)
Chloride: 100 mmol/L (ref 98–111)
Creatinine, Ser: 0.93 mg/dL (ref 0.44–1.00)
GFR calc Af Amer: >60 mL/min (ref >60)
GFR calc non Af Amer: 52 mL/min — ABNORMAL LOW (ref >60)
Glucose, Bld: 109 mg/dL — ABNORMAL HIGH (ref 70–99)
Potassium: 4.3 mmol/L (ref 3.5–5.1)
Sodium: 134 mmol/L — ABNORMAL LOW (ref 135–145)
Total Bilirubin: 0.6 mg/dL (ref 0.3–1.2)
Total Protein: 6.2 g/dL — ABNORMAL LOW (ref 6.5–8.1)

## 2018-03-30 LAB — TROPONIN I: Troponin I: <0.03 ng/mL (ref <0.03)

## 2018-03-30 MED ORDER — BOOST / RESOURCE BREEZE PO LIQD CUSTOM
1.0000 | Freq: Three times a day (TID) | ORAL | Status: DC
  Filled 2018-03-30: qty 1

## 2018-03-30 MED ORDER — BOOST PLUS PO LIQD
237.0000 mL | Freq: Three times a day (TID) | ORAL | Status: DC
  Administered 2018-03-30: 237 mL via ORAL
  Filled 2018-03-30: qty 237

## 2018-03-30 NOTE — ED Notes (Signed)
Patient transported to X-ray 

## 2018-03-30 NOTE — ED Notes (Signed)
[facility] transporting pt back to [facility].  All belongings taken with transport service.

## 2018-03-30 NOTE — ED Triage Notes (Signed)
Pt BIB [facility]. Pt coming from assisted living. With aid pt was unresponsive for approx. 4 minutes. Upon EMS arrival, pt was a&o x3.

## 2018-03-30 NOTE — ED Notes (Signed)
Pt 9th on [facility] list @1805 

## 2018-03-30 NOTE — ED Provider Notes (Signed)
[facility] EMERGENCY DEPARTMENT Provider Note   CSN: [id number] Arrival date & time: 03/30/18  1235    History   Chief Complaint Chief Complaint  Patient presents with  . Altered Mental Status    HPI Vanessa [name] is a 85 y.o. female.     HPI Patient is a 85 year old female presents to the emergency department from her assisted living center.  Apparently she had a transient unresponsive episode that lasted 3 to 4 minutes per EMS.  On EMS arrival the patient was awake and alert and oriented x3 without complaints.  Blood sugar was normal.  Vital signs on arrival to emergency department were normal.  Patient feels fine.  She reports no preceding chest pain or palpitations.  She has no complaints at this time.  She denies fevers and chills.  Denies shortness of breath.  No recent cough.  No recent illness.  She is requesting something to drink at this time.   Past Medical History:  Diagnosis Date  . Abdominal bloating   . Atrial fibrillation ([place])   . Bruises easily   . Carotid artery occlusion    LEFT  . Dizziness   . DOE (dyspnea on exertion) 04/02/2014  . Fall at home Sept 2013, Dec. 2013  Jun 08, 2012  . GERD (gastroesophageal reflux disease) 02/11/2015  . Headache(784.0)   . Hoarseness   . Hypercholesterolemia   . Hyperglycemia 04/02/2014   Glucose 178 mg percent on 01/22/2014 04/05/14 Hgb A1c 6.6 Diet controlled.    . Hypothyroidism 04/15/2015  . Neuropathy    PERIPHERAL  . Pruritus   . Scoliosis   . Varicose veins     Patient Active Problem List   Diagnosis Date Noted  . Mild cognitive impairment 08/01/2017  . Leg mass, left 04/01/2017  . Moderate protein-calorie malnutrition ([place]) 12/27/2016  . Closed fracture of nasal bones   . Severe protein-calorie malnutrition ([place])   . Atrial fibrillation with RVR ([place]) 12/02/2016  . Esophageal stenosis   . History of rib fracture 08/04/2016  . Age-related osteoporosis without current pathological  fracture 08/04/2016  . Paresthesia 05/12/2016  . Unsteady gait 04/19/2016  . Urinary frequency 12/09/2015  . OCD (obsessive compulsive disorder) 09/02/2015  . Chest pain 06/12/2015  . Hypothyroidism 04/15/2015  . Suprapubic pain 04/01/2015  . GERD (gastroesophageal reflux disease) 02/11/2015  . Constipation 02/11/2015  . Aspiration into respiratory tract 08/27/2014  . Cricopharyngeal hypertrophy 08/27/2014  . Muscle tension dysphonia 08/27/2014  . 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 Low back pain 08/13/2014  . Carotid stenosis 08/13/2014  . Hoarse 08/13/2014  . Xerostomia 06/18/2014  . Prickling sensation-Left Leg 06/05/2014  . Tingling sensation-Left Leg 06/05/2014  . Swelling of ankle 06/05/2014  . Hyperglycemia 04/02/2014  . Loss of weight 04/02/2014  . DOE (dyspnea on exertion) 04/02/2014  . Idiopathic scoliosis 04/02/2014  . Hearing loss 04/02/2014  . Dysphagia 04/02/2014  . Seborrheic keratoses, inflamed 04/02/2014  . Palliative care encounter 09/12/2013  . Weakness generalized 09/12/2013  . Anxiety state 09/12/2013  . GI bleed 09/10/2013  . Other and unspecified ovarian cyst 09/10/2013  . PVD (peripheral vascular disease) ([place]) 02/21/2013  . Occlusion of left internal carotid artery 08/16/2012  . Varicose veins 12/22/2011  . Malaise and fatigue 10/06/2011  . Raynaud phenomenon 06/26/2010  . Osteoarthritis 06/26/2010  . Atrial fibrillation ([place])   . Neuropathy   . Hyperlipidemia   . Headache   . Pruritus     Past Surgical  History:  Procedure Laterality Date  . ABDOMINAL HYSTERECTOMY  1954  . CHOLECYSTECTOMY  1997  . ESOPHAGOGASTRODUODENOSCOPY (EGD) WITH PROPOFOL N/A 11/25/2016   Procedure: ESOPHAGOGASTRODUODENOSCOPY (EGD) WITH PROPOFOL;  Surgeon: [name], MD;  Location: [facility];  Service: Endoscopy;  Laterality: N/A;  . [place]   correct scoliosis     OB History   No obstetric history on file.      Home Medications     Prior to Admission medications   Medication Sig Start Date End Date Taking? Authorizing Provider  acetaminophen (TYLENOL) 500 MG tablet Take 500 mg by mouth 2 (two) times daily.     [provider]  Artificial Saliva [facility]) PACK Use as directed 1 Package in the mouth or throat. mix with 11mL's of H20; swish and spit BID; may keep in bathroom; may self administer.    [provider]  aspirin 81 MG chewable tablet Chew 81 mg by mouth daily.    [provider]  atenolol (TENORMIN) 25 MG tablet Take 12.5 mg by mouth 2 (two) times daily. Hold if SBP < 100 or HR < 60    [provider]  Biotin 5000 MCG TABS Take 5,000 mcg by mouth daily.    [provider]  Calcium-Vitamin D-Vitamin K (VIACTIV) 025-427-06 MG-UNT-MCG CHEW Chew 1 tablet by mouth daily.     [provider]  fluticasone (FLONASE) 50 MCG/ACT nasal spray Place 2 sprays into both nostrils daily.    [provider]  hydrogen peroxide 1.5 % SOLN Apply 1 application topically as needed (Swish and spit for dry mouth).    [provider]  ipratropium (ATROVENT) 0.06 % nasal spray Place 2 sprays into both nostrils 2 (two) times daily as needed for rhinitis.    [provider]  lactose free nutrition (BOOST PLUS) LIQD Take 237 mLs by mouth 2 (two) times daily with a meal. Per request of resident, states can't eat if takes it later     [provider]  lidocaine-prilocaine (EMLA) cream Apply 1 application topically as needed. 11/04/17   Kathrynn Ducking, MD  loratadine (CLARITIN) 10 MG tablet Take 10 mg by mouth daily as needed for allergies.    [provider]  Menthol, Topical Analgesic, (BIOFREEZE ROLL-ON) 4 % GEL Apply 1 application topically every 8 (eight) hours. Apply to LLE    [provider]  Multiple Vitamins-Minerals (MULTIVITAL) tablet Take 1 tablet by mouth daily.    [provider]  ondansetron (ZOFRAN) 4 MG tablet  Take 1 tablet (4 mg total) by mouth every 6 (six) hours as needed for nausea. 12/07/16   Allie Bossier, MD  polyethylene glycol [facility] Floria Raveling) packet Take 17 g daily as needed by mouth (for constipation).     [provider]  pregabalin (LYRICA) 25 MG capsule Take 1 capsule (25 mg total) by mouth 2 (two) times daily. 11/04/17   Kathrynn Ducking, MD  sodium phosphate (FLEET) 7-19 GM/118ML ENEM Place 1 enema rectally as needed for severe constipation. Give Fleet enema per rectum X 1 after removing hard stool or if soft stool is present in rectum.    [provider]  traMADol (ULTRAM) 50 MG tablet Take 1 tablet (50 mg total) by mouth 3 (three) times daily as needed. 03/15/18   Mast, Man X, NP  triamcinolone cream (KENALOG) 0.1 % Apply 1 application topically as needed. Apply to itchy areas as needed. May keep in room  and self apply    [provider]    Family History Family History  Adopted: Yes  Problem Relation Age of Onset  . Diabetes Mother   . Heart attack Mother   . Heart disease Mother        After age 14  . Heart attack Father   . Stroke Father   . Breast cancer Sister   . Heart disease Maternal Grandmother   . Cancer Son        adopted son. esophagus, stomach, liver  . Stomach cancer Neg Hx   . Colon cancer Neg Hx     Social History Social History   Tobacco Use  . Smoking status: Never Smoker  . Smokeless tobacco: Never Used  Substance Use Topics  . Alcohol use: No    Alcohol/week: 0.0 standard drinks  . Drug use: No     Allergies   Patient has no known allergies.   Review of Systems Review of Systems  All other systems reviewed and are negative.    Physical Exam Updated Vital Signs BP 126/64   Pulse (!) 57   Temp 97.7 F (36.5 C) (Oral)   Resp 20   SpO2 99%   Physical Exam Vitals signs and nursing note reviewed.  Constitutional:      General: She is not in acute distress.    Appearance: She is well-developed.   HENT:     Head: Normocephalic and atraumatic.  Neck:     Musculoskeletal: Normal range of motion.  Cardiovascular:     Rate and Rhythm: Normal rate and regular rhythm.     Heart sounds: Normal heart sounds.  Pulmonary:     Effort: Pulmonary effort is normal.     Breath sounds: Normal breath sounds.  Abdominal:     General: There is no distension.     Palpations: Abdomen is soft.     Tenderness: There is no abdominal tenderness.  Musculoskeletal: Normal range of motion.  Skin:    General: Skin is warm and dry.  Neurological:     Mental Status: She is alert and oriented to person, place, and time.  Psychiatric:        Judgment: Judgment normal.      ED Treatments / Results  Labs (all labs ordered are listed, but only abnormal results are displayed) Labs Reviewed  CBC - Abnormal; Notable for the following components:      Result Value   MCV 102.6 (*)    All other components within normal limits  COMPREHENSIVE METABOLIC PANEL - Abnormal; Notable for the following components:   Sodium 134 (*)    Glucose, Bld 109 (*)    Total Protein 6.2 (*)    GFR calc non Af Amer 52 (*)    All other components within normal limits  TROPONIN I    EKG None  Radiology Dg Chest 2 View  Result Date: 03/30/2018 CLINICAL DATA:  Syncope. EXAM: CHEST - 2 VIEW COMPARISON:  Chest x-ray dated December 06, 2016. FINDINGS: The heart size and mediastinal contours are within normal limits. Normal pulmonary vascularity. Atherosclerotic calcification of the aortic arch. The lungs remain hyperinflated with emphysematous changes. Decreased now trace left pleural effusion. Resolved right pleural effusion. No consolidation or pneumothorax. No acute osseous abnormality. Old left-sided rib fractures. IMPRESSION: 1. Decreased now trace left pleural effusion. Resolved right pleural effusion. 2. COPD. Electronically Signed   By: Titus Dubin M.D.   On: 03/30/2018 13:20    Procedures Procedures (  including  critical care time)  Medications Ordered in ED Medications - No data to display   Initial Impression / Assessment and Plan / ED Course  I have reviewed the triage vital signs and the nursing notes.  Pertinent labs & imaging results that were available during my care of the patient were reviewed by me and considered in my medical decision making (see chart for details).        Patient was observed in the emergency department on telemetry.  No ectopy or arrhythmia was noted.  Work-up in the emergency department is without significant abnormality.  The patient is eating and drinking without difficulty.  She feels fine.  She would like to go home.  I think the patient is stable for discharge home at this time.  Her risk of admission likely outweighs the benefit.  Close primary care follow-up.  Patient understands return to the ER for new or worsening symptoms  Final Clinical Impressions(s) / ED Diagnoses   Final diagnoses:  None    ED Discharge Orders    None       Jola Schmidt, MD 03/30/18 [phone]

## 2018-03-31 ENCOUNTER — Encounter: Payer: Self-pay | Admitting: Internal Medicine

## 2018-03-31 ENCOUNTER — Non-Acute Institutional Stay: Payer: Medicare Other | Admitting: Internal Medicine

## 2018-03-31 DIAGNOSIS — I480 Paroxysmal atrial fibrillation: Secondary | ICD-10-CM | POA: Diagnosis not present

## 2018-03-31 DIAGNOSIS — G629 Polyneuropathy, unspecified: Secondary | ICD-10-CM

## 2018-03-31 DIAGNOSIS — R55 Syncope and collapse: Secondary | ICD-10-CM

## 2018-03-31 NOTE — Progress Notes (Signed)
Location:  [facility] Room Number: 1 Place of Service:  ALF [phone]) Provider:Serafina Topham L,MD   [name], MD  Patient Care Team: [name], MD as PCP - General (Internal Medicine) Angelia Mould, MD as Attending Physician (Vascular Surgery) [name], MD (Inactive) as Attending Physician (Obstetrics and Gynecology) [name], MD (Orthopedic Surgery) [name], MD (Ophthalmology) [name], MD as Consulting Physician (Urology) [name], MD as Consulting Physician (Cardiology) [name], PA-C as Physician Assistant (Otolaryngology) [name], MD as Consulting Physician (Otolaryngology) [name], MD as Consulting Physician (Dermatology) [name], MD as Consulting Physician (Physical Medicine and Rehabilitation) [name], MD as Attending Physician (Gastroenterology) Mast, Man X, NP as Nurse Practitioner (Internal Medicine) Kathrynn Ducking, MD as Consulting Physician (Neurology) Debara Pickett Nadean Corwin, MD as Consulting Physician (Cardiology) Druscilla Brownie, MD as Consulting Physician (Dermatology)  Extended Emergency Contact Information Primary Emergency Contact: [name] of [place] Phone: [phone] Mobile Phone: [phone] Relation: Son Secondary Emergency Contact: [name] Mobile Phone: [phone] Relation: Friend  Code Status: Full Code  Goals of care: Advanced Directive information Advanced Directives 03/31/2018  Does Patient Have a Medical Advance Directive? Yes  Type of Advance Directive [place]  Does patient want to make changes to medical advance directive? No - Patient declined  Copy of [place] in Chart? -  Would patient like information on creating a medical advance directive? No - Patient declined     Chief Complaint  Patient presents with  . Hospitalization Follow-up    ED follow up     HPI:   Pt is a 85 y.o. female seen today for an acute visit for Follow up from ED  Patient has h/o chronic atrial fibrillation not on any coagulation, GERD, hypothyroidism, hyperlipidemia, neuropathy,, cognitive impairment Patient lives in [place].  She walks with her walker. Yesterday she was sitting in her chair when therapy came to her room and saw her not responding.  She called the nurse and according to the nurse they* kept calling her name and patient would not respond.  They called the EMS.  Once patient was on a stretcher she started responding.  Patient says she does remember people calling her name but she could not answer.  Denies any chest pain.  Denies any vertigo.  She was in the ED for almost 8 hours and was monitored.  And she was alert and oriented and back to her baseline she was discharged back to the facility Patient continues to be at her baseline her only complaint is that she feels slightly more weak.  Continues to deny any dizziness.   Past Medical History:  Diagnosis Date  . Abdominal bloating   . Atrial fibrillation ([place])   . Bruises easily   . Carotid artery occlusion    LEFT  . Dizziness   . DOE (dyspnea on exertion) 04/02/2014  . Fall at home Sept 2013, Dec. 2013  Jun 08, 2012  . GERD (gastroesophageal reflux disease) 02/11/2015  . Headache(784.0)   . Hoarseness   . Hypercholesterolemia   . Hyperglycemia 04/02/2014   Glucose 178 mg percent on 01/22/2014 04/05/14 Hgb A1c 6.6 Diet controlled.    . Hypothyroidism 04/15/2015  . Neuropathy    PERIPHERAL  . Pruritus   . Scoliosis   . Varicose veins    Past Surgical History:  Procedure Laterality Date  . ABDOMINAL HYSTERECTOMY  1954  . CHOLECYSTECTOMY  1997  .  ESOPHAGOGASTRODUODENOSCOPY (EGD) WITH PROPOFOL N/A 11/25/2016   Procedure: ESOPHAGOGASTRODUODENOSCOPY (EGD) WITH PROPOFOL;  Surgeon: [name], MD;  Location: [facility];  Service: Endoscopy;  Laterality: N/A;  . [place]   correct scoliosis     No Known Allergies  Outpatient Encounter Medications as of 03/31/2018  Medication Sig  . acetaminophen (TYLENOL) 500 MG tablet Take 500 mg by mouth 2 (two) times daily.   . Artificial Saliva (SALIVAMAX) PACK Use as directed 1 Package in the mouth or throat. mix with 37mL's of H20; swish and spit BID; may keep in bathroom; may self administer.  Marland Kitchen aspirin 81 MG chewable tablet Chew 81 mg by mouth daily.  Marland Kitchen atenolol (TENORMIN) 25 MG tablet Take 12.5 mg by mouth 2 (two) times daily. Hold if SBP < 100 or HR < 60  . Biotin 5000 MCG TABS Take 5,000 mcg by mouth daily.  . Calcium-Vitamin D-Vitamin K (VIACTIV) 818-299-37 MG-UNT-MCG CHEW Chew 1 tablet by mouth daily.   . fluticasone (FLONASE) 50 MCG/ACT nasal spray Place 2 sprays into both nostrils daily.  . hydrogen peroxide 1.5 % SOLN Apply 1 application topically as needed (Swish and spit for dry mouth).  Marland Kitchen ipratropium (ATROVENT) 0.06 % nasal spray Place 2 sprays into both nostrils 2 (two) times daily as needed for rhinitis.  Marland Kitchen lactose free nutrition (BOOST PLUS) LIQD Take 237 mLs by mouth 2 (two) times daily with a meal. Per request of resident, states can't eat if takes it later   . lidocaine-prilocaine (EMLA) cream Apply 1 application topically as needed.  . loratadine (CLARITIN) 10 MG tablet Take 10 mg by mouth daily as needed for allergies.  . Menthol, Topical Analgesic, (BIOFREEZE ROLL-ON) 4 % GEL Apply 1 application topically every 8 (eight) hours. Apply to LLE  . Multiple Vitamins-Minerals (MULTIVITAL) tablet Take 1 tablet by mouth daily.  . ondansetron (ZOFRAN) 4 MG tablet Take 1 tablet (4 mg total) by mouth every 6 (six) hours as needed for nausea.  . polyethylene glycol (MIRALAX / GLYCOLAX) packet Take 17 g daily as needed by mouth (for constipation).   . pregabalin (LYRICA) 25 MG capsule Take 1 capsule (25 mg total) by mouth 2 (two) times daily.  . sodium phosphate (FLEET) 7-19 GM/118ML ENEM Place 1 enema rectally as needed for severe  constipation. Give Fleet enema per rectum X 1 after removing hard stool or if soft stool is present in rectum.  . traMADol (ULTRAM) 50 MG tablet Take 1 tablet (50 mg total) by mouth 3 (three) times daily as needed.  . triamcinolone cream (KENALOG) 0.1 % Apply 1 application topically as needed. Apply to itchy areas as needed. May keep in room and self apply   Facility-Administered Encounter Medications as of 03/31/2018  Medication  . Bevacizumab (AVASTIN) SOLN 1.25 mg  . Bevacizumab (AVASTIN) SOLN 1.25 mg    Review of Systems  Constitutional: Positive for activity change.  HENT: Negative.   Respiratory: Negative.   Cardiovascular: Negative.   Gastrointestinal: Negative.   Genitourinary: Negative.   Musculoskeletal: Negative.   Skin: Negative.   Neurological: Positive for weakness.  Psychiatric/Behavioral: Negative.     Immunization History  Administered Date(s) Administered  . Influenza Split 10/20/2011, 11/03/2012  . Influenza,inj,Quad PF,6+ Mos 09/29/2012, 11/09/2013  . Influenza-Unspecified 10/17/2014, 10/23/2015, 10/20/2016, 10/17/2017  . PPD Test 08/21/2013, 03/04/2014  . Pneumococcal Conjugate-13 08/21/2013  . Pneumococcal Polysaccharide-23 09/08/2016  . Tdap 05/20/2008  . Zoster 11/01/2012   Pertinent  Health Maintenance Due  Topic  Date Due  . INFLUENZA VACCINE  Completed  . DEXA SCAN  Completed  . PNA vac Low Risk Adult  Completed   Fall Risk  11/04/2017 09/05/2017 09/22/2016 08/25/2016 05/31/2016  Falls in the past year? Yes Yes Yes Yes No  Number falls in past yr: 2 or more 2 or more 1 2 or more -  Comment - - - - -  Injury with Fall? Yes Yes Yes Yes -  Comment - - fx 3 ribs - -  Risk Factor Category  - - - - -  Risk for fall due to : - - Impaired mobility;Impaired balance/gait - -  Risk for fall due to: Comment - - - - -   Functional Status Survey:    Vitals:   03/31/18 1201  BP: (!) 120/52  Pulse: 60  Resp: 18  Temp: 98.9 F (37.2 C)  SpO2: 98%   Weight: 101 lb (45.8 kg)  Height: 5\' 2"  (1.575 m)   Body mass index is 18.47 kg/m. Physical Exam Vitals signs reviewed.  Constitutional:      Appearance: Normal appearance.  HENT:     Head: Normocephalic.     Mouth/Throat:     Mouth: Mucous membranes are moist.     Pharynx: Oropharynx is clear.  Eyes:     Pupils: Pupils are equal, round, and reactive to light.  Neck:     Musculoskeletal: Neck supple.  Cardiovascular:     Rate and Rhythm: Normal rate. Rhythm irregular.     Pulses: Normal pulses.  Pulmonary:     Effort: Pulmonary effort is normal. No respiratory distress.     Breath sounds: No wheezing or rales.  Abdominal:     General: Abdomen is flat. Bowel sounds are normal.     Palpations: Abdomen is soft.  Musculoskeletal:        General: No swelling.  Skin:    General: Skin is warm and dry.  Neurological:     General: No focal deficit present.     Mental Status: She is alert and oriented to person, place, and time.  Psychiatric:        Mood and Affect: Mood normal.        Thought Content: Thought content normal.        Judgment: Judgment normal.     Labs reviewed: Recent Labs    08/04/17 03/06/18 03/30/18 1252  NA 140  --  134*  K 4.2  --  4.3  CL 103  --  100  CO2 29  --  26  GLUCOSE  --   --  109*  BUN 17 17 16   CREATININE 0.8 0.8 0.93  CALCIUM 9.3  --  9.7   Recent Labs    08/04/17 03/06/18 03/30/18 1252  AST 24 21 30   ALT 13 12 16   ALKPHOS 67  --  77  BILITOT  --   --  0.6  PROT 5.9  --  6.2*  ALBUMIN  --   --  3.6   Recent Labs    08/04/17 03/06/18 03/30/18 1252  WBC 6.2  --  8.1  HGB 12.2 12.4 13.2  HCT 35* 36 40.1  MCV  --   --  102.6*  PLT 198 217 196   Lab Results  Component Value Date   TSH 3.44 08/04/2017   Lab Results  Component Value Date   HGBA1C 6.4 07/07/2015   Lab Results  Component Value Date   CHOL 187 11/03/2017  HDL 84 (A) 11/03/2017   LDLCALC 80 11/03/2017   TRIG 136 11/03/2017   CHOLHDL 2.1 07/29/2016     Significant Diagnostic Results in last 30 days:  Dg Chest 2 View  Result Date: 03/30/2018 CLINICAL DATA:  Syncope. EXAM: CHEST - 2 VIEW COMPARISON:  Chest x-ray dated December 06, 2016. FINDINGS: The heart size and mediastinal contours are within normal limits. Normal pulmonary vascularity. Atherosclerotic calcification of the aortic arch. The lungs remain hyperinflated with emphysematous changes. Decreased now trace left pleural effusion. Resolved right pleural effusion. No consolidation or pneumothorax. No acute osseous abnormality. Old left-sided rib fractures. IMPRESSION: 1. Decreased now trace left pleural effusion. Resolved right pleural effusion. 2. COPD. Electronically Signed   By: Titus Dubin M.D.   On: 03/30/2018 13:20    Assessment/Plan Syncope There can be number of reason for this including TIA or cardiac or vasovagal. Her EKG in ED showed Atrial Fibrillation with HR of 58 Will continue to monitor her closely. Will Check Orthostatic BP  Also Check Vitals Q Shift Patient is mildily Bradycardic on Low dose of Lopressor But att his time will continue to monitor. Repeat Labs in 1 week Also will check Vit B12 Already on Aspirin   Paroxysmal atrial fibrillation  On Aspirin only and Lopressor  Neuropathy Has follow up with Neurology She is on Lyrica       Family/ staff Communication:   Labs/tests ordered:

## 2018-04-05 ENCOUNTER — Other Ambulatory Visit: Payer: Self-pay | Admitting: Family

## 2018-04-05 MED ORDER — PREGABALIN 25 MG PO CAPS: 25.0000 mg | ORAL_CAPSULE | Freq: Two times a day (BID) | ORAL | 3 refills | Status: DC

## 2018-04-05 NOTE — Telephone Encounter (Signed)
Refill request received from [facility].  Request completed, pended, and forwarded to Webb Silversmith, NP for approval and transmittal to [facility].

## 2018-04-06 DIAGNOSIS — D649 Anemia, unspecified: Secondary | ICD-10-CM | POA: Diagnosis not present

## 2018-04-06 DIAGNOSIS — I10 Essential (primary) hypertension: Secondary | ICD-10-CM | POA: Diagnosis not present

## 2018-04-06 DIAGNOSIS — R5382 Chronic fatigue, unspecified: Secondary | ICD-10-CM | POA: Diagnosis not present

## 2018-04-06 DIAGNOSIS — D510 Vitamin B12 deficiency anemia due to intrinsic factor deficiency: Secondary | ICD-10-CM | POA: Diagnosis not present

## 2018-04-06 DIAGNOSIS — Z79899 Other long term (current) drug therapy: Secondary | ICD-10-CM | POA: Diagnosis not present

## 2018-04-06 LAB — CBC AND DIFFERENTIAL
HCT: 39 (ref 36–46)
Hemoglobin: 13.2 (ref 12.0–16.0)
Platelets: 200 (ref 150–399)

## 2018-04-06 LAB — BASIC METABOLIC PANEL
BUN: 17 (ref 4–21)
Creatinine: 0.9 (ref 0.5–1.1)
Glucose: 78
Potassium: 4.6 (ref 3.4–5.3)
Sodium: 138 (ref 137–147)

## 2018-04-06 LAB — VITAMIN B12: Vitamin B-12: 524

## 2018-04-17 ENCOUNTER — Non-Acute Institutional Stay: Payer: Medicare Other | Admitting: Family

## 2018-04-17 ENCOUNTER — Encounter: Payer: Self-pay | Admitting: Family

## 2018-04-17 DIAGNOSIS — G8929 Other chronic pain: Secondary | ICD-10-CM | POA: Diagnosis not present

## 2018-04-17 DIAGNOSIS — K5901 Slow transit constipation: Secondary | ICD-10-CM | POA: Diagnosis not present

## 2018-04-17 DIAGNOSIS — M545 Low back pain: Secondary | ICD-10-CM

## 2018-04-17 NOTE — Progress Notes (Signed)
Location:  [facility] Room Number: 1 Place of Service:  ALF (13) Provider:Dinah [name].NP   [name], MD  Patient Care Team: [name], MD as PCP - General (Internal Medicine) Angelia Mould, MD as Attending Physician (Vascular Surgery) [name], MD (Inactive) as Attending Physician (Obstetrics and Gynecology) [name], MD (Orthopedic Surgery) [name], MD (Ophthalmology) [name], MD as Consulting Physician (Urology) [name], MD as Consulting Physician (Cardiology) [name], PA-C as Physician Assistant (Otolaryngology) [name], MD as Consulting Physician (Otolaryngology) [name], MD as Consulting Physician (Dermatology) [name], MD as Consulting Physician (Physical Medicine and Rehabilitation) [name], MD as Attending Physician (Gastroenterology) Mast, Man X, NP as Nurse Practitioner (Internal Medicine) Kathrynn Ducking, MD as Consulting Physician (Neurology) Debara Pickett Nadean Corwin, MD as Consulting Physician (Cardiology) Druscilla Brownie, MD as Consulting Physician (Dermatology) [name], [name], NP as Nurse Practitioner (Family Medicine)  Extended Emergency Contact Information Primary Emergency Contact: [name] of [place] Phone: [phone] Mobile Phone: [phone] Relation: Son Secondary Emergency Contact: [name] Mobile Phone: [phone] Relation: Friend  Code Status:Full Code  Goals of care: Advanced Directive information Advanced Directives 04/17/2018  Does Patient Have a Medical Advance Directive? Yes  Type of Paramedic of [place];Living will  Does patient want to make changes to medical advance directive? No - Guardian declined  Copy of [place] in Chart? Yes - validated most recent copy scanned in chart (See row information)  Would patient like information on creating a medical advance  directive? No - Patient declined     Chief Complaint  Patient presents with  . Acute Visit    Lower backpain     HPI:  Pt is a 85 y.o. female seen today for Acute visit for evaluation of lower back pain.she is seen in her room per facility Nurse supervisor request.Nurse states patient states current pain medication is not working.she is seen in her room today sitting up on her chair with Nurse supervisor present at bedside.she describes lower back pain as intermittent worst whenever she moves or walk.Pain improves with rest or lying down.she is currently on Tylenol 500 mg tablet twice daily and Tramadol 50 mg tablet every 6 hours as needed for pain.she follows up with [facility].she has a significant medical history of Osteoarthritis and DDD among other conditions.     Past Medical History:  Diagnosis Date  . Abdominal bloating   . Atrial fibrillation ([place])   . Bruises easily   . Carotid artery occlusion    LEFT  . Dizziness   . DOE (dyspnea on exertion) 04/02/2014  . Fall at home Sept 2013, Dec. 2013  Jun 08, 2012  . GERD (gastroesophageal reflux disease) 02/11/2015  . Headache(784.0)   . Hoarseness   . Hypercholesterolemia   . Hyperglycemia 04/02/2014   Glucose 178 mg percent on 01/22/2014 04/05/14 Hgb A1c 6.6 Diet controlled.    . Hypothyroidism 04/15/2015  . Neuropathy    PERIPHERAL  . Pruritus   . Scoliosis   . Varicose veins    Past Surgical History:  Procedure Laterality Date  . ABDOMINAL HYSTERECTOMY  1954  . CHOLECYSTECTOMY  1997  . ESOPHAGOGASTRODUODENOSCOPY (EGD) WITH PROPOFOL N/A 11/25/2016   Procedure: ESOPHAGOGASTRODUODENOSCOPY (EGD) WITH PROPOFOL;  Surgeon: [name], MD;  Location: [facility];  Service: Endoscopy;  Laterality: N/A;  . SPINE SURGERY  1997   correct scoliosis    No  Known Allergies  Outpatient Encounter Medications as of 04/17/2018  Medication Sig  . acetaminophen (TYLENOL) 500 MG tablet Take 500 mg by mouth  2 (two) times daily.   . Artificial Saliva (SALIVAMAX) PACK Use as directed 1 Package in the mouth or throat. mix with 74mL's of H20; swish and spit BID; may keep in bathroom; may self administer.  Marland Kitchen aspirin 81 MG chewable tablet Chew 81 mg by mouth daily.  Marland Kitchen atenolol (TENORMIN) 25 MG tablet Take 12.5 mg by mouth 2 (two) times daily. Hold if SBP < 100 or HR < 60  . Biotin 5000 MCG TABS Take 5,000 mcg by mouth daily.  . Calcium-Vitamin D-Vitamin K (VIACTIV) 161-096-04 MG-UNT-MCG CHEW Chew 1 tablet by mouth daily.   . fluticasone (FLONASE) 50 MCG/ACT nasal spray Place 2 sprays into both nostrils daily.  . hydrogen peroxide 1.5 % SOLN Apply 1 application topically as needed (Swish and spit for dry mouth).  Marland Kitchen ipratropium (ATROVENT) 0.06 % nasal spray Place 2 sprays into both nostrils 2 (two) times daily as needed for rhinitis.  Marland Kitchen lactose free nutrition (BOOST PLUS) LIQD Take 237 mLs by mouth 2 (two) times daily with a meal. Per request of resident, states can't eat if takes it later   . lidocaine-prilocaine (EMLA) cream Apply 1 application topically as needed.  . loratadine (CLARITIN) 10 MG tablet Take 10 mg by mouth daily as needed for allergies.  . Menthol, Topical Analgesic, (BIOFREEZE ROLL-ON) 4 % GEL Apply 1 application topically every 8 (eight) hours. Apply to LLE  . Multiple Vitamins-Minerals (MULTIVITAL) tablet Take 1 tablet by mouth daily.  . ondansetron (ZOFRAN) 4 MG tablet Take 1 tablet (4 mg total) by mouth every 6 (six) hours as needed for nausea.  . polyethylene glycol (MIRALAX / GLYCOLAX) packet Take 17 g daily as needed by mouth (for constipation).   . pregabalin (LYRICA) 25 MG capsule Take 1 capsule (25 mg total) by mouth 2 (two) times daily.  . sodium phosphate (FLEET) 7-19 GM/118ML ENEM Place 1 enema rectally as needed for severe constipation. Give Fleet enema per rectum X 1 after removing hard stool or if soft stool is present in rectum.  . traMADol (ULTRAM) 50 MG tablet Take 1  tablet (50 mg total) by mouth 3 (three) times daily as needed.  . triamcinolone cream (KENALOG) 0.1 % Apply 1 application topically as needed. Apply to itchy areas as needed. May keep in room and self apply   Facility-Administered Encounter Medications as of 04/17/2018  Medication  . Bevacizumab (AVASTIN) SOLN 1.25 mg  . Bevacizumab (AVASTIN) SOLN 1.25 mg    Review of Systems  Constitutional: Negative for appetite change, chills, fatigue, fever and unexpected weight change.  Respiratory: Negative for cough, chest tightness, shortness of breath and wheezing.   Cardiovascular: Negative for chest pain, palpitations and leg swelling.  Gastrointestinal: Positive for constipation. Negative for abdominal distention, abdominal pain, diarrhea, nausea and vomiting.  Genitourinary: Negative for difficulty urinating, dysuria, flank pain, frequency and urgency.  Musculoskeletal: Positive for arthralgias, back pain and gait problem.  Skin: Negative for color change, pallor and rash.  Neurological:       Chronic numbness and tingling in the feet on Lyrica.     Immunization History  Administered Date(s) Administered  . Influenza Split 10/20/2011, 11/03/2012  . Influenza,inj,Quad PF,6+ Mos 09/29/2012, 11/09/2013  . Influenza-Unspecified 10/17/2014, 10/23/2015, 10/20/2016, 10/17/2017  . PPD Test 08/21/2013, 03/04/2014  . Pneumococcal Conjugate-13 08/21/2013  . Pneumococcal Polysaccharide-23 09/08/2016  . Tdap  05/20/2008  . Zoster 11/01/2012   Pertinent  Health Maintenance Due  Topic Date Due  . INFLUENZA VACCINE  08/12/2018  . DEXA SCAN  Completed  . PNA vac Low Risk Adult  Completed   Fall Risk  11/04/2017 09/05/2017 09/22/2016 08/25/2016 05/31/2016  Falls in the past year? Yes Yes Yes Yes No  Number falls in past yr: 2 or more 2 or more 1 2 or more -  Comment - - - - -  Injury with Fall? Yes Yes Yes Yes -  Comment - - fx 3 ribs - -  Risk Factor Category  - - - - -  Risk for fall due to : - -  Impaired mobility;Impaired balance/gait - -  Risk for fall due to: Comment - - - - -    Vitals:   04/17/18 1147  BP: (!) 132/54  Pulse: 88  Temp: (!) 97.5 F (36.4 C)  SpO2: 95%  Weight: 101 lb (45.8 kg)  Height: 5\' 2"  (1.575 m)   Body mass index is 18.47 kg/m. Physical Exam Vitals signs and nursing note reviewed.  Constitutional:      General: She is not in acute distress.    Appearance: She is underweight. She is not ill-appearing.  HENT:     Head: Normocephalic.  Cardiovascular:     Rate and Rhythm: Normal rate and regular rhythm.     Pulses: Normal pulses.     Heart sounds: Murmur present. No friction rub. No gallop.   Pulmonary:     Effort: Pulmonary effort is normal. No respiratory distress.     Breath sounds: Normal breath sounds. No wheezing, rhonchi or rales.  Chest:     Chest wall: No tenderness.  Abdominal:     General: Bowel sounds are normal. There is no distension.     Palpations: Abdomen is soft. There is no mass.     Tenderness: There is no abdominal tenderness. There is no right CVA tenderness, left CVA tenderness, guarding or rebound.  Musculoskeletal:        General: No swelling or tenderness.     Right lower leg: No edema.     Left lower leg: No edema.     Comments: Moves x 4 extremities.unsteady gait uses walker.   Skin:    General: Skin is warm and dry.     Coloration: Skin is not pale.     Findings: No erythema or rash.  Neurological:     Mental Status: She is alert and oriented to person, place, and time.     Cranial Nerves: No cranial nerve deficit.     Motor: No weakness.     Coordination: Coordination normal.     Gait: Gait abnormal.  Psychiatric:        Mood and Affect: Mood normal.        Behavior: Behavior normal.        Thought Content: Thought content normal.        Judgment: Judgment normal.    Labs reviewed: Recent Labs    08/04/17 03/06/18 03/30/18 1252  NA 140  --  134*  K 4.2  --  4.3  CL 103  --  100  CO2 29  --   26  GLUCOSE  --   --  109*  BUN 17 17 16   CREATININE 0.8 0.8 0.93  CALCIUM 9.3  --  9.7   Recent Labs    08/04/17 03/06/18 03/30/18 1252  AST 24 21 30  ALT 13 12 16   ALKPHOS 67  --  77  BILITOT  --   --  0.6  PROT 5.9  --  6.2*  ALBUMIN  --   --  3.6   Recent Labs    08/04/17 03/06/18 03/30/18 1252  WBC 6.2  --  8.1  HGB 12.2 12.4 13.2  HCT 35* 36 40.1  MCV  --   --  102.6*  PLT 198 217 196   Lab Results  Component Value Date   TSH 3.44 08/04/2017   Lab Results  Component Value Date   HGBA1C 6.4 07/07/2015   Lab Results  Component Value Date   CHOL 187 11/03/2017   HDL 84 (A) 11/03/2017   LDLCALC 80 11/03/2017   TRIG 136 11/03/2017   CHOLHDL 2.1 07/29/2016    Significant Diagnostic Results in last 30 days:  Dg Chest 2 View  Result Date: 03/30/2018 CLINICAL DATA:  Syncope. EXAM: CHEST - 2 VIEW COMPARISON:  Chest x-ray dated December 06, 2016. FINDINGS: The heart size and mediastinal contours are within normal limits. Normal pulmonary vascularity. Atherosclerotic calcification of the aortic arch. The lungs remain hyperinflated with emphysematous changes. Decreased now trace left pleural effusion. Resolved right pleural effusion. No consolidation or pneumothorax. No acute osseous abnormality. Old left-sided rib fractures. IMPRESSION: 1. Decreased now trace left pleural effusion. Resolved right pleural effusion. 2. COPD. Electronically Signed   By: Titus Dubin M.D.   On: 03/30/2018 13:20    Assessment/Plan  1. Chronic right-sided low back pain without sciatica Continue on Tylenol 500 mg tablet twice daily.Discontinue Tramadol 50 mg tablet every 6 hours as needed then schedule Tramadol 50 mg tablet one by mouth twice daily in the morning and bedtime and Tramadol 50 mg tablet one by mouth daily as needed for pain.start Lidocaine 4% topical patch one application to right lower back daily for back pain.Remove patch after 12 hours.continue to monitor.Will send to  Orthopedic if still no improvement.   2. Constipation  Reports no good BM x 3 days.continue Miralax 17 gm daily as needed.Senokot- S  8.6-50 mg tablet  take one by mouth daily at bedtime.   Family/ staff Communication: Reviewed plan with patient and facility Nurse supervisor.  Labs/tests ordered: None

## 2018-04-26 ENCOUNTER — Other Ambulatory Visit: Payer: Self-pay

## 2018-04-26 MED ORDER — TRAMADOL HCL 50 MG PO TABS: 50.0000 mg | ORAL_TABLET | Freq: Two times a day (BID) | ORAL | 0 refills | Status: DC

## 2018-04-28 ENCOUNTER — Encounter: Payer: Self-pay | Admitting: Family

## 2018-04-28 ENCOUNTER — Non-Acute Institutional Stay: Payer: Medicare Other | Admitting: Family

## 2018-04-28 DIAGNOSIS — R682 Dry mouth, unspecified: Secondary | ICD-10-CM | POA: Diagnosis not present

## 2018-04-28 DIAGNOSIS — K219 Gastro-esophageal reflux disease without esophagitis: Secondary | ICD-10-CM | POA: Diagnosis not present

## 2018-04-28 DIAGNOSIS — M545 Low back pain, unspecified: Secondary | ICD-10-CM

## 2018-04-28 DIAGNOSIS — G8929 Other chronic pain: Secondary | ICD-10-CM

## 2018-04-28 DIAGNOSIS — K117 Disturbances of salivary secretion: Secondary | ICD-10-CM

## 2018-04-28 DIAGNOSIS — I480 Paroxysmal atrial fibrillation: Secondary | ICD-10-CM | POA: Diagnosis not present

## 2018-04-28 DIAGNOSIS — K5901 Slow transit constipation: Secondary | ICD-10-CM

## 2018-04-28 NOTE — Progress Notes (Signed)
Location:  [name] Room Number: 1 Place of Service:  ALF [phone]) Provider: Dinah [name] FNP-C   [name], MD  Patient Care Team: [name], MD as PCP - General (Internal Medicine) Angelia Mould, MD as Attending Physician (Vascular Surgery) [name], MD (Inactive) as Attending Physician (Obstetrics and Gynecology) [name], MD (Orthopedic Surgery) [name], MD (Ophthalmology) [name], MD as Consulting Physician (Urology) [name], MD as Consulting Physician (Cardiology) [name], PA-C as Physician Assistant (Otolaryngology) [name], MD as Consulting Physician (Otolaryngology) [name], MD as Consulting Physician (Dermatology) [name], MD as Consulting Physician (Physical Medicine and Rehabilitation) [name], MD as Attending Physician (Gastroenterology) Mast, Man X, NP as Nurse Practitioner (Internal Medicine) Kathrynn Ducking, MD as Consulting Physician (Neurology) Debara Pickett Nadean Corwin, MD as Consulting Physician (Cardiology) Druscilla Brownie, MD as Consulting Physician (Dermatology) [name], [name], NP as Nurse Practitioner (Family Medicine)  Extended Emergency Contact Information Primary Emergency Contact: [name] of [place] Phone: [phone] Mobile Phone: [phone] Relation: Son Secondary Emergency Contact: [name] Mobile Phone: [phone] Relation: Friend  Code Status: Full Code  Goals of care: Advanced Directive information Advanced Directives 04/17/2018  Does Patient Have a Medical Advance Directive? Yes  Type of Paramedic of [place];Living will  Does patient want to make changes to medical advance directive? No - Guardian declined  Copy of [place] in Chart? Yes - validated most recent copy scanned in chart (See row information)  Would patient like information on creating a medical  advance directive? No - Patient declined     Chief Complaint  Patient presents with  . Medical Management of Chronic Issues    routine visit     HPI:  Pt is a 85 y.o. female seen today Vanessa [name] for medical management of chronic diseases.she has a medical history of Afib.PVD,dysphagia,GERD,Xerostomia,OCD among other conditions.she is seen in her room today.she complains of dry mouth states has not used her artificial saliva today.she states artificial saliva helps but does not resolve dryness of the mouth.Her lower back pain has improved with scheduled Norco and lidocaine patch rates pain from 3-5 on scale of 10. Her weight log reviewed weight remains stable for the past one month though states appetite is fair.No recent fall episodes or acute illness.COVID-19 precaution per CDC guideline discussed.she states has been staying in her room,washes hands and has a mask that she wears.she denies any cough,fever or chills.    Past Medical History:  Diagnosis Date  . Abdominal bloating   . Atrial fibrillation ([place])   . Bruises easily   . Carotid artery occlusion    LEFT  . Dizziness   . DOE (dyspnea on exertion) 04/02/2014  . Fall at home Sept 2013, Dec. 2013  Jun 08, 2012  . GERD (gastroesophageal reflux disease) 02/11/2015  . Headache(784.0)   . Hoarseness   . Hypercholesterolemia   . Hyperglycemia 04/02/2014   Glucose 178 mg percent on 01/22/2014 04/05/14 Hgb A1c 6.6 Diet controlled.    . Hypothyroidism 04/15/2015  . Neuropathy    PERIPHERAL  . Pruritus   . Scoliosis   . Varicose veins    Past Surgical History:  Procedure Laterality Date  . ABDOMINAL HYSTERECTOMY  1954  . CHOLECYSTECTOMY  1997  . ESOPHAGOGASTRODUODENOSCOPY (EGD) WITH PROPOFOL N/A 11/25/2016   Procedure: ESOPHAGOGASTRODUODENOSCOPY (EGD) WITH PROPOFOL;  Surgeon: [name], MD;  Location: [facility];  Service: Endoscopy;  Laterality: N/A;  . [place]   correct scoliosis    No Known  Allergies  Allergies as of 04/28/2018   No Known Allergies     Medication List       Accurate as of April 28, 2018  1:07 PM. Always use your most recent med list.        acetaminophen 500 MG tablet Commonly known as:  TYLENOL Take 500 mg by mouth 2 (two) times daily.   aspirin 81 MG chewable tablet Chew 81 mg by mouth daily.   atenolol 25 MG tablet Commonly known as:  TENORMIN Take 12.5 mg by mouth 2 (two) times daily. Hold if SBP < 100 or HR < 60   Biofreeze Roll-On 4 % Gel Generic drug:  Menthol (Topical Analgesic) Apply 1 application topically every 8 (eight) hours. Apply to LLE   Biotin 5000 MCG Tabs Take 5,000 mcg by mouth daily.   fluticasone 50 MCG/ACT nasal spray Commonly known as:  FLONASE Place 2 sprays into both nostrils daily.   hydrogen peroxide 1.5 % Soln Apply 1 application topically as needed (Swish and spit for dry mouth).   ipratropium 0.06 % nasal spray Commonly known as:  ATROVENT Place 2 sprays into both nostrils 2 (two) times daily as needed for rhinitis.   lactose free nutrition Liqd Take 237 mLs by mouth 2 (two) times daily with a meal. Per request of resident, states can't eat if takes it later   Lidocaine 4 % Ptch Apply 1 patch topically. Remove patch at bedtime   lidocaine-prilocaine cream Commonly known as:  EMLA Apply 1 application topically daily as needed.   lidocaine-prilocaine cream Commonly known as:  EMLA Apply 1 application topically as needed.   loratadine 10 MG tablet Commonly known as:  CLARITIN Take 10 mg by mouth daily as needed for allergies.   Multivital tablet Take 1 tablet by mouth daily.   ondansetron 4 MG tablet Commonly known as:  ZOFRAN Take 1 tablet (4 mg total) by mouth every 6 (six) hours as needed for nausea.   polyethylene glycol 17 g packet Commonly known as:  MIRALAX / GLYCOLAX Take 17 g daily as needed by mouth (for constipation).   pregabalin 25 MG capsule Commonly known as:  Lyrica Take  1 capsule (25 mg total) by mouth 2 (two) times daily.   SalivaMAX Pack Use as directed 1 Package in the mouth or throat. mix with 69mL's of H20; swish and spit BID; may keep in bathroom; may self administer.   sennosides-docusate sodium 8.6-50 MG tablet Commonly known as:  SENOKOT-S Take 1 tablet by mouth daily.   sodium phosphate 7-19 GM/118ML Enem Place 1 enema rectally as needed for severe constipation. Give Fleet enema per rectum X 1 after removing hard stool or if soft stool is present in rectum.   traMADol 50 MG tablet Commonly known as:  ULTRAM Take 1 tablet (50 mg total) by mouth 2 (two) times daily. Also take one by mouth once daily as needed   triamcinolone cream 0.1 % Commonly known as:  KENALOG Apply 1 application topically as needed. Apply to itchy areas as needed. May keep in room and self apply       Review of Systems  Constitutional: Negative for activity change, appetite change, chills, fatigue, fever and unexpected weight change.  HENT: Positive for hearing loss. Negative for congestion, rhinorrhea, sinus pressure, sinus pain, sneezing and sore throat.   Eyes: Positive for visual disturbance. Negative for  pain, discharge, redness and itching.       States no vision on right eye.wears eye glasses.   Respiratory: Negative for cough, chest tightness, shortness of breath and wheezing.   Cardiovascular: Negative for chest pain, palpitations and leg swelling.  Gastrointestinal: Negative for abdominal distention, abdominal pain, diarrhea, nausea and vomiting.       Reports bowel movement sometimes every three days and sometimes regular.MOM has been effective. Aware to ask for MOM.   Endocrine: Negative for cold intolerance, heat intolerance, polydipsia, polyphagia and polyuria.  Genitourinary: Negative for difficulty urinating, dysuria, flank pain, frequency and urgency.  Musculoskeletal: Positive for arthralgias, back pain and gait problem.  Skin: Negative for color  change, pallor, rash and wound.  Neurological: Negative for dizziness, light-headedness and headaches.  Hematological: Does not bruise/bleed easily.  Psychiatric/Behavioral: Negative for agitation, confusion and sleep disturbance. The patient is not nervous/anxious.     Immunization History  Administered Date(s) Administered  . Influenza Split 10/20/2011, 11/03/2012  . Influenza,inj,Quad PF,6+ Mos 09/29/2012, 11/09/2013  . Influenza-Unspecified 10/17/2014, 10/23/2015, 10/20/2016, 10/17/2017  . PPD Test 08/21/2013, 03/04/2014  . Pneumococcal Conjugate-13 08/21/2013  . Pneumococcal Polysaccharide-23 09/08/2016  . Tdap 05/20/2008  . Zoster 11/01/2012   Pertinent  Health Maintenance Due  Topic Date Due  . INFLUENZA VACCINE  08/12/2018  . DEXA SCAN  Completed  . PNA vac Low Risk Adult  Completed   Fall Risk  11/04/2017 09/05/2017 09/22/2016 08/25/2016 05/31/2016  Falls in the past year? Yes Yes Yes Yes No  Number falls in past yr: 2 or more 2 or more 1 2 or more -  Comment - - - - -  Injury with Fall? Yes Yes Yes Yes -  Comment - - fx 3 ribs - -  Risk Factor Category  - - - - -  Risk for fall due to : - - Impaired mobility;Impaired balance/gait - -  Risk for fall due to: Comment - - - - -    Vitals:   04/28/18 1252  BP: 136/70  Pulse: 70  Resp: 18  Temp: (!) 97.3 F (36.3 C)  SpO2: 93%  Weight: 101 lb (45.8 kg)  Height: 5\' 2"  (1.575 m)   Body mass index is 18.47 kg/m. Physical Exam Vitals signs and nursing note reviewed.  Constitutional:      General: She is not in acute distress.    Appearance: She is underweight. She is not ill-appearing.  HENT:     Head: Normocephalic.     Right Ear: Tympanic membrane, ear canal and external ear normal. There is no impacted cerumen.     Left Ear: External ear normal. There is no impacted cerumen.     Nose: Nose normal. No congestion or rhinorrhea.     Mouth/Throat:     Mouth: Mucous membranes are moist.     Pharynx: Oropharynx is  clear. No oropharyngeal exudate or posterior oropharyngeal erythema.  Eyes:     General: No scleral icterus.       Right eye: No discharge.        Left eye: No discharge.     Conjunctiva/sclera: Conjunctivae normal.     Pupils: Pupils are equal, round, and reactive to light.     Comments: Corrective lens in place  Neck:     Musculoskeletal: Normal range of motion. No neck rigidity or muscular tenderness.     Vascular: No carotid bruit.  Cardiovascular:     Rate and Rhythm: Normal rate and regular rhythm.  Heart sounds: Murmur present. No friction rub. No gallop.   Pulmonary:     Effort: Pulmonary effort is normal. No respiratory distress.     Breath sounds: Normal breath sounds. No wheezing, rhonchi or rales.  Chest:     Chest wall: No tenderness.  Abdominal:     General: Bowel sounds are normal. There is no distension.     Palpations: Abdomen is soft. There is no mass.     Tenderness: There is no abdominal tenderness. There is no right CVA tenderness, left CVA tenderness, guarding or rebound.  Musculoskeletal:        General: No swelling or tenderness.     Right lower leg: No edema.     Left lower leg: No edema.     Comments: Unsteady gait ambulates with walker.   Lymphadenopathy:     Cervical: No cervical adenopathy.  Skin:    General: Skin is warm and dry.     Coloration: Skin is not pale.     Findings: No bruising, erythema or rash.  Neurological:     Mental Status: She is alert and oriented to person, place, and time.     Cranial Nerves: No cranial nerve deficit.     Sensory: No sensory deficit.     Motor: No weakness.     Coordination: Coordination normal.     Gait: Gait abnormal.  Psychiatric:        Mood and Affect: Mood normal.        Behavior: Behavior normal.        Thought Content: Thought content normal.        Judgment: Judgment normal.    Labs reviewed: Recent Labs    08/04/17 03/06/18 03/30/18 1252  NA 140  --  134*  K 4.2  --  4.3  CL 103  --   100  CO2 29  --  26  GLUCOSE  --   --  109*  BUN 17 17 16   CREATININE 0.8 0.8 0.93  CALCIUM 9.3  --  9.7   Recent Labs    08/04/17 03/06/18 03/30/18 1252  AST 24 21 30   ALT 13 12 16   ALKPHOS 67  --  77  BILITOT  --   --  0.6  PROT 5.9  --  6.2*  ALBUMIN  --   --  3.6   Recent Labs    08/04/17 03/06/18 03/30/18 1252  WBC 6.2  --  8.1  HGB 12.2 12.4 13.2  HCT 35* 36 40.1  MCV  --   --  102.6*  PLT 198 217 196   Lab Results  Component Value Date   TSH 3.44 08/04/2017   Lab Results  Component Value Date   HGBA1C 6.4 07/07/2015   Lab Results  Component Value Date   CHOL 187 11/03/2017   HDL 84 (A) 11/03/2017   LDLCALC 80 11/03/2017   TRIG 136 11/03/2017   CHOLHDL 2.1 07/29/2016    Significant Diagnostic Results in last 30 days:  Dg Chest 2 View  Result Date: 03/30/2018 CLINICAL DATA:  Syncope. EXAM: CHEST - 2 VIEW COMPARISON:  Chest x-ray dated December 06, 2016. FINDINGS: The heart size and mediastinal contours are within normal limits. Normal pulmonary vascularity. Atherosclerotic calcification of the aortic arch. The lungs remain hyperinflated with emphysematous changes. Decreased now trace left pleural effusion. Resolved right pleural effusion. No consolidation or pneumothorax. No acute osseous abnormality. Old left-sided rib fractures. IMPRESSION: 1. Decreased now trace left pleural effusion.  Resolved right pleural effusion. 2. COPD. Electronically Signed   By: Titus Dubin M.D.   On: 03/30/2018 13:20    Assessment/Plan 1. Paroxysmal atrial fibrillation (HCC) HR controlled.continue on atenolol 1.25 mg tablet twice daily  2. Chronic right-sided low back pain without sciatica Rates pain 3-5 on scale.will continue on scheduled Tramadol 50 mg tablet twice daily and one tablet once daily as needed,Extra strength tylenol 500 mg tablet twice daily,lyrica 25 mg capsule twice daily and lidocaine patch daily.continue to monitor.     3. Slow transit constipation  Continue current regimen and MOM as needed if no BM x 3 days.continue to encourage oral intake and hydration.  4. Gastroesophageal reflux disease without esophagitis Asymptomatic.continue to monitor.   5. Xerostomia Continue with artificial saliva.encourage hydration.   Family/ staff Communication: Reviewed plan of care with patient and facility Nurse supervisor   Labs/tests ordered: None   Sandrea Hughs, NP

## 2018-04-28 NOTE — Progress Notes (Signed)
Location:  [facility] Room Number: 1 Place of Service:  ALF [phone]) Provider: [name] Dinah C,NP   [name], MD  Patient Care Team: [name], MD as PCP - General (Internal Medicine) Angelia Mould, MD as Attending Physician (Vascular Surgery) [name], MD (Inactive) as Attending Physician (Obstetrics and Gynecology) [name], MD (Orthopedic Surgery) [name], MD (Ophthalmology) [name], MD as Consulting Physician (Urology) [name], MD as Consulting Physician (Cardiology) [name], PA-C as Physician Assistant (Otolaryngology) [name], MD as Consulting Physician (Otolaryngology) [name], MD as Consulting Physician (Dermatology) [name], MD as Consulting Physician (Physical Medicine and Rehabilitation) [name], MD as Attending Physician (Gastroenterology) Mast, Man X, NP as Nurse Practitioner (Internal Medicine) Kathrynn Ducking, MD as Consulting Physician (Neurology) Debara Pickett Nadean Corwin, MD as Consulting Physician (Cardiology) Druscilla Brownie, MD as Consulting Physician (Dermatology) [name], [name], NP as Nurse Practitioner (Family Medicine)  Extended Emergency Contact Information Primary Emergency Contact: [name] of [place] Phone: [phone] Mobile Phone: [phone] Relation: Son Secondary Emergency Contact: [name] Mobile Phone: [phone] Relation: Friend  Code Status: DNR Goals of care: Advanced Directive information Advanced Directives 04/28/2018  Does Patient Have a Medical Advance Directive? Yes  Type of Advance Directive [place]  Does patient want to make changes to medical advance directive? No - Patient declined  Copy of [place] in Chart? Yes - validated most recent copy scanned in chart (See row information)  Would patient like information on creating a medical advance directive? No -  Patient declined     Chief Complaint  Patient presents with  . Medical Management of Chronic Issues    routine visit     HPI:  Pt is a 85 y.o. female seen today for medical management of chronic diseases.     Past Medical History:  Diagnosis Date  . Abdominal bloating   . Atrial fibrillation ([place])   . Bruises easily   . Carotid artery occlusion    LEFT  . Dizziness   . DOE (dyspnea on exertion) 04/02/2014  . Fall at home Sept 2013, Dec. 2013  Jun 08, 2012  . GERD (gastroesophageal reflux disease) 02/11/2015  . Headache(784.0)   . Hoarseness   . Hypercholesterolemia   . Hyperglycemia 04/02/2014   Glucose 178 mg percent on 01/22/2014 04/05/14 Hgb A1c 6.6 Diet controlled.    . Hypothyroidism 04/15/2015  . Neuropathy    PERIPHERAL  . Pruritus   . Scoliosis   . Varicose veins    Past Surgical History:  Procedure Laterality Date  . ABDOMINAL HYSTERECTOMY  1954  . CHOLECYSTECTOMY  1997  . ESOPHAGOGASTRODUODENOSCOPY (EGD) WITH PROPOFOL N/A 11/25/2016   Procedure: ESOPHAGOGASTRODUODENOSCOPY (EGD) WITH PROPOFOL;  Surgeon: [name], MD;  Location: [facility];  Service: Endoscopy;  Laterality: N/A;  . [place]   correct scoliosis    No Known Allergies  Outpatient Encounter Medications as of 04/28/2018  Medication Sig  . acetaminophen (TYLENOL) 500 MG tablet Take 500 mg by mouth 2 (two) times daily.   . Artificial Saliva (SALIVAMAX) PACK Use as directed 1 Package in the mouth or throat. mix with 17mL's of H20; swish and spit BID; may keep in bathroom; may self administer.  Marland Kitchen aspirin 81 MG chewable tablet Chew 81 mg by mouth daily.  Marland Kitchen atenolol (TENORMIN) 25 MG tablet Take 12.5 mg by mouth 2 (two) times daily. Hold if SBP <  100 or HR < 60  . Biotin 5000 MCG TABS Take 5,000 mcg by mouth daily.  . fluticasone (FLONASE) 50 MCG/ACT nasal spray Place 2 sprays into both nostrils daily.  . hydrogen peroxide 1.5 % SOLN Apply 1 application topically as needed (Swish and  spit for dry mouth).  Marland Kitchen ipratropium (ATROVENT) 0.06 % nasal spray Place 2 sprays into both nostrils 2 (two) times daily as needed for rhinitis.  Marland Kitchen lactose free nutrition (BOOST PLUS) LIQD Take 237 mLs by mouth 2 (two) times daily with a meal. Per request of resident, states can't eat if takes it later   . Lidocaine 4 % PTCH Apply 1 patch topically. Remove patch at bedtime  . lidocaine-prilocaine (EMLA) cream Apply 1 application topically as needed.  . lidocaine-prilocaine (EMLA) cream Apply 1 application topically daily as needed.  . loratadine (CLARITIN) 10 MG tablet Take 10 mg by mouth daily as needed for allergies.  . Menthol, Topical Analgesic, (BIOFREEZE ROLL-ON) 4 % GEL Apply 1 application topically every 8 (eight) hours. Apply to LLE  . Multiple Vitamins-Minerals (MULTIVITAL) tablet Take 1 tablet by mouth daily.  . ondansetron (ZOFRAN) 4 MG tablet Take 1 tablet (4 mg total) by mouth every 6 (six) hours as needed for nausea.  . polyethylene glycol (MIRALAX / GLYCOLAX) packet Take 17 g daily as needed by mouth (for constipation).   . pregabalin (LYRICA) 25 MG capsule Take 1 capsule (25 mg total) by mouth 2 (two) times daily.  . sennosides-docusate sodium (SENOKOT-S) 8.6-50 MG tablet Take 1 tablet by mouth daily.  . sodium phosphate (FLEET) 7-19 GM/118ML ENEM Place 1 enema rectally as needed for severe constipation. Give Fleet enema per rectum X 1 after removing hard stool or if soft stool is present in rectum.  . traMADol (ULTRAM) 50 MG tablet Take 1 tablet (50 mg total) by mouth 2 (two) times daily. Also take one by mouth once daily as needed  . triamcinolone cream (KENALOG) 0.1 % Apply 1 application topically as needed. Apply to itchy areas as needed. May keep in room and self apply  . [DISCONTINUED] Calcium-Vitamin D-Vitamin K (VIACTIV) 300-923-30 MG-UNT-MCG CHEW Chew 1 tablet by mouth daily.    Facility-Administered Encounter Medications as of 04/28/2018  Medication  . Bevacizumab  (AVASTIN) SOLN 1.25 mg  . Bevacizumab (AVASTIN) SOLN 1.25 mg    Review of Systems  Immunization History  Administered Date(s) Administered  . Influenza Split 10/20/2011, 11/03/2012  . Influenza,inj,Quad PF,6+ Mos 09/29/2012, 11/09/2013  . Influenza-Unspecified 10/17/2014, 10/23/2015, 10/20/2016, 10/17/2017  . PPD Test 08/21/2013, 03/04/2014  . Pneumococcal Conjugate-13 08/21/2013  . Pneumococcal Polysaccharide-23 09/08/2016  . Tdap 05/20/2008  . Zoster 11/01/2012   Pertinent  Health Maintenance Due  Topic Date Due  . INFLUENZA VACCINE  08/12/2018  . DEXA SCAN  Completed  . PNA vac Low Risk Adult  Completed   Fall Risk  11/04/2017 09/05/2017 09/22/2016 08/25/2016 05/31/2016  Falls in the past year? Yes Yes Yes Yes No  Number falls in past yr: 2 or more 2 or more 1 2 or more -  Comment - - - - -  Injury with Fall? Yes Yes Yes Yes -  Comment - - fx 3 ribs - -  Risk Factor Category  - - - - -  Risk for fall due to : - - Impaired mobility;Impaired balance/gait - -  Risk for fall due to: Comment - - - - -   Functional Status Survey:    Vitals:   04/28/18  1252  BP: 136/70  Pulse: 70  Resp: 18  Temp: (!) 97.3 F (36.3 C)  SpO2: 93%  Weight: 101 lb (45.8 kg)  Height: 5\' 2"  (1.575 m)   Body mass index is 18.47 kg/m. Physical Exam  Labs reviewed: Recent Labs    08/04/17 03/06/18 03/30/18 1252 04/06/18  NA 140  --  134* 138  K 4.2  --  4.3 4.6  CL 103  --  100  --   CO2 29  --  26  --   GLUCOSE  --   --  109*  --   BUN 17 17 16 17   CREATININE 0.8 0.8 0.93 0.9  CALCIUM 9.3  --  9.7  --    Recent Labs    08/04/17 03/06/18 03/30/18 1252  AST 24 21 30   ALT 13 12 16   ALKPHOS 67  --  77  BILITOT  --   --  0.6  PROT 5.9  --  6.2*  ALBUMIN  --   --  3.6   Recent Labs    08/04/17 03/06/18 03/30/18 1252 04/06/18  WBC 6.2  --  8.1  --   HGB 12.2 12.4 13.2 13.2  HCT 35* 36 40.1 39  MCV  --   --  102.6*  --   PLT 198 217 196 200   Lab Results  Component Value  Date   TSH 3.44 08/04/2017   Lab Results  Component Value Date   HGBA1C 6.4 07/07/2015   Lab Results  Component Value Date   CHOL 187 11/03/2017   HDL 84 (A) 11/03/2017   LDLCALC 80 11/03/2017   TRIG 136 11/03/2017   CHOLHDL 2.1 07/29/2016    Significant Diagnostic Results in last 30 days:  Dg Chest 2 View  Result Date: 03/30/2018 CLINICAL DATA:  Syncope. EXAM: CHEST - 2 VIEW COMPARISON:  Chest x-ray dated December 06, 2016. FINDINGS: The heart size and mediastinal contours are within normal limits. Normal pulmonary vascularity. Atherosclerotic calcification of the aortic arch. The lungs remain hyperinflated with emphysematous changes. Decreased now trace left pleural effusion. Resolved right pleural effusion. No consolidation or pneumothorax. No acute osseous abnormality. Old left-sided rib fractures. IMPRESSION: 1. Decreased now trace left pleural effusion. Resolved right pleural effusion. 2. COPD. Electronically Signed   By: Titus Dubin M.D.   On: 03/30/2018 13:20    Assessment/Plan 1. Paroxysmal atrial fibrillation (HCC)   2. Chronic right-sided low back pain without sciatica   3. Slow transit constipation   4. Gastroesophageal reflux disease without esophagitis   5. Xerostomia     Family/ staff Communication:   Labs/tests ordered:

## 2018-05-03 ENCOUNTER — Ambulatory Visit (INDEPENDENT_AMBULATORY_CARE_PROVIDER_SITE_OTHER): Payer: Medicare Other | Admitting: Ophthalmology

## 2018-05-03 ENCOUNTER — Other Ambulatory Visit: Payer: Self-pay

## 2018-05-03 ENCOUNTER — Encounter (INDEPENDENT_AMBULATORY_CARE_PROVIDER_SITE_OTHER): Payer: Self-pay | Admitting: Ophthalmology

## 2018-05-03 ENCOUNTER — Encounter (INDEPENDENT_AMBULATORY_CARE_PROVIDER_SITE_OTHER): Payer: Medicare Other | Admitting: Ophthalmology

## 2018-05-03 DIAGNOSIS — H3581 Retinal edema: Secondary | ICD-10-CM | POA: Diagnosis not present

## 2018-05-03 DIAGNOSIS — I10 Essential (primary) hypertension: Secondary | ICD-10-CM

## 2018-05-03 DIAGNOSIS — H34811 Central retinal vein occlusion, right eye, with macular edema: Secondary | ICD-10-CM | POA: Diagnosis not present

## 2018-05-03 DIAGNOSIS — H35033 Hypertensive retinopathy, bilateral: Secondary | ICD-10-CM | POA: Diagnosis not present

## 2018-05-03 DIAGNOSIS — Z961 Presence of intraocular lens: Secondary | ICD-10-CM

## 2018-05-03 DIAGNOSIS — H353132 Nonexudative age-related macular degeneration, bilateral, intermediate dry stage: Secondary | ICD-10-CM | POA: Diagnosis not present

## 2018-05-03 DIAGNOSIS — D3131 Benign neoplasm of right choroid: Secondary | ICD-10-CM

## 2018-05-03 MED ORDER — BEVACIZUMAB CHEMO INJECTION 1.25MG/0.05ML SYRINGE FOR KALEIDOSCOPE
1.2500 mg | INTRAVITREAL | Status: AC | PRN
  Administered 2018-05-03: 1.25 mg via INTRAVITREAL

## 2018-05-03 NOTE — Progress Notes (Addendum)
[facility] Note  05/03/2018     CHIEF COMPLAINT Patient presents for Retina Follow Up   HISTORY OF PRESENT ILLNESS: Vanessa [name] is a 85 y.o. female who presents to the clinic today for:   HPI    Retina Follow Up    Patient presents with  CRVO/BRVO.  In right eye.  This started months ago.  Severity is severe.  Duration of 9 weeks.  Since onset it is stable.  I, the attending physician,  performed the HPI with the patient and updated documentation appropriately.          Comments    85 y/o female pt here for 9 wk f/u for CRVO w/CME OD.  No change in [place] OU.  Still has large black floater OD.  Denies pain, flashes.  No gtts.       Last edited by Bernarda Caffey, MD on 05/03/2018 11:48 AM. (History)    pt states her right eye is "blurred", she states if she covers her left eye there is a black spot in her central vision, she states when she looks at the eye chart she can see the light from it, but there is a black cloud over it so she cannot see the letters  Referring physician: [name], MD [place], [place] [id number]  HISTORICAL INFORMATION:   Selected notes from the [facility] Referred by Dr. [name] for concern of CME LEE: 01.21.20 (RKaty Fitch) [BCVA: OD: OS:] Ocular Hx- PMH-    CURRENT MEDICATIONS: No current outpatient medications on file. (Ophthalmic Drugs)   No current facility-administered medications for this visit.  (Ophthalmic Drugs)   Current Outpatient Medications (Other)  Medication Sig  . acetaminophen (TYLENOL) 500 MG tablet Take 500 mg by mouth 2 (two) times daily.   . Artificial Saliva (SALIVAMAX) PACK Use as directed 1 Package in the mouth or throat. mix with 54mL's of H20; swish and spit BID; may keep in bathroom; may self administer.  Marland Kitchen aspirin 81 MG chewable tablet Chew 81 mg by mouth daily.  Marland Kitchen atenolol (TENORMIN) 25 MG tablet Take 12.5 mg by mouth 2 (two) times daily. Hold if SBP <  100 or HR < 60  . Biotin 5000 MCG TABS Take 5,000 mcg by mouth daily.  . fluticasone (FLONASE) 50 MCG/ACT nasal spray Place 2 sprays into both nostrils daily.  . hydrogen peroxide 1.5 % SOLN Apply 1 application topically as needed (Swish and spit for dry mouth).  Marland Kitchen ipratropium (ATROVENT) 0.06 % nasal spray Place 2 sprays into both nostrils 2 (two) times daily as needed for rhinitis.  Marland Kitchen lactose free nutrition (BOOST PLUS) LIQD Take 237 mLs by mouth 2 (two) times daily with a meal. Per request of resident, states can't eat if takes it later   . Lidocaine 4 % PTCH Apply 1 patch topically. Remove patch at bedtime  . lidocaine-prilocaine (EMLA) cream Apply 1 application topically as needed.  . lidocaine-prilocaine (EMLA) cream Apply 1 application topically daily as needed.  . loratadine (CLARITIN) 10 MG tablet Take 10 mg by mouth daily as needed for allergies.  . Menthol, Topical Analgesic, (BIOFREEZE ROLL-ON) 4 % GEL Apply 1 application topically every 8 (eight) hours. Apply to LLE  . Multiple Vitamins-Minerals (MULTIVITAL) tablet Take 1 tablet by mouth daily.  . ondansetron (ZOFRAN) 4 MG tablet Take 1 tablet (4 mg total) by mouth every 6 (six) hours as needed for nausea.  Marland Kitchen  polyethylene glycol (MIRALAX / GLYCOLAX) packet Take 17 g daily as needed by mouth (for constipation).   . pregabalin (LYRICA) 25 MG capsule Take 1 capsule (25 mg total) by mouth 2 (two) times daily.  . sennosides-docusate sodium (SENOKOT-S) 8.6-50 MG tablet Take 1 tablet by mouth daily.  . sodium phosphate (FLEET) 7-19 GM/118ML ENEM Place 1 enema rectally as needed for severe constipation. Give Fleet enema per rectum X 1 after removing hard stool or if soft stool is present in rectum.  . traMADol (ULTRAM) 50 MG tablet Take 1 tablet (50 mg total) by mouth 2 (two) times daily. Also take one by mouth once daily as needed  . triamcinolone cream (KENALOG) 0.1 % Apply 1 application topically as needed. Apply to itchy areas as needed.  May keep in room and self apply   Current Facility-Administered Medications (Other)  Medication Route  . Bevacizumab (AVASTIN) SOLN 1.25 mg Intravitreal  . Bevacizumab (AVASTIN) SOLN 1.25 mg Intravitreal      REVIEW OF SYSTEMS: ROS    Positive for: Gastrointestinal, Neurological, Musculoskeletal, Cardiovascular, Eyes   Negative for: Constitutional, Skin, Genitourinary, HENT, Endocrine, Respiratory, Psychiatric, Allergic/Imm, Heme/Lymph   Last edited by Matthew Folks, COA on 05/03/2018 10:37 AM. (History)       ALLERGIES No Known Allergies  PAST MEDICAL HISTORY Past Medical History:  Diagnosis Date  . Abdominal bloating   . Atrial fibrillation ([place])   . Bruises easily   . Carotid artery occlusion    LEFT  . Dizziness   . DOE (dyspnea on exertion) 04/02/2014  . Fall at home Sept 2013, Dec. 2013  Jun 08, 2012  . GERD (gastroesophageal reflux disease) 02/11/2015  . Headache(784.0)   . Hoarseness   . Hypercholesterolemia   . Hyperglycemia 04/02/2014   Glucose 178 mg percent on 01/22/2014 04/05/14 Hgb A1c 6.6 Diet controlled.    . Hypothyroidism 04/15/2015  . Neuropathy    PERIPHERAL  . Pruritus   . Scoliosis   . Varicose veins    Past Surgical History:  Procedure Laterality Date  . ABDOMINAL HYSTERECTOMY  1954  . CHOLECYSTECTOMY  1997  . ESOPHAGOGASTRODUODENOSCOPY (EGD) WITH PROPOFOL N/A 11/25/2016   Procedure: ESOPHAGOGASTRODUODENOSCOPY (EGD) WITH PROPOFOL;  Surgeon: [name], MD;  Location: [facility];  Service: Endoscopy;  Laterality: N/A;  . [place]   correct scoliosis    FAMILY HISTORY Family History  Adopted: Yes  Problem Relation Age of Onset  . Diabetes Mother   . Heart attack Mother   . Heart disease Mother        After age 38  . Heart attack Father   . Stroke Father   . Breast cancer Sister   . Heart disease Maternal Grandmother   . Cancer Son        adopted son. esophagus, stomach, liver  . Stomach cancer Neg Hx   . Colon  cancer Neg Hx     SOCIAL HISTORY Social History   Tobacco Use  . Smoking status: Never Smoker  . Smokeless tobacco: Never Used  Substance Use Topics  . Alcohol use: No    Alcohol/week: 0.0 standard drinks  . Drug use: No         OPHTHALMIC EXAM:  Base Eye Exam    Visual Acuity (Snellen - Linear)      Right Left   Dist cc CF75ft 20/30 +2   Dist ph cc NI NI   Correction:  Glasses  Tonometry (Tonopen, 10:40 AM)      Right Left   Pressure 13 12       Pupils      Dark Light Shape React APD   Right 3 2 Round Minimal None   Left 3 2 Round Minimal None       Visual Fields (Counting fingers)      Left Right    Full Full       Extraocular Movement      Right Left    Full, Ortho Full, Ortho       Neuro/Psych    Oriented x3:  Yes   Mood/Affect:  Normal       Dilation    Both eyes:  1.0% Mydriacyl, 2.5% Phenylephrine @ 10:40 AM        Slit Lamp and Fundus Exam    Slit Lamp Exam      Right Left   Lids/Lashes Dermatochalasis - upper lid Dermatochalasis - upper lid, Meibomian gland dysfunction   Conjunctiva/Sclera White and quiet White and quiet   Cornea Arcus, 2+ diffuse Punctate epithelial erosions Arcus, 2+ diffuse Punctate epithelial erosions   Anterior Chamber deep and clear deep and clear   Iris Round and moderately dilated to 72mm Round and moderately dilated to 14mm   Lens PC IOL in good position with open PC PC IOL in good position with open PC   Vitreous Vitreous syneresis Vitreous syneresis       Fundus Exam      Right Left   Disc Pink and sharp, hyperemic, scattered disc hemes - improved, vascular loops SN quad Pink and Sharp   C/D Ratio 0.4 0.4   Macula Blunted foveal reflex, RPE mottling and clumping, diffuse IRH consist with CRVO, CWS, +cystic change Blunted foveal reflex, Drusen, RPE mottling and clumping, No heme or edema   Vessels Attenuated, very tortuous, CRVO Vascular attenuation, Tortuous   Periphery Attached, choroidal nevus at  0930 equator w/ overlying drusen, minimal elavation, no orange pigment, 3DD in diam Attached, reticular degeneration, peripheral cystoid degeneration          IMAGING AND PROCEDURES  Imaging and Procedures for @TODAY @  Intravitreal Injection, Pharmacologic Agent - OD - Right Eye       Time Out 05/03/2018. 11:51 AM. Confirmed correct patient, procedure, site, and patient consented.   Anesthesia Topical anesthesia was used. Anesthetic medications included Lidocaine 2%, Proparacaine 0.5%.   Procedure Preparation included 5% betadine to ocular surface, eyelid speculum. A supplied needle was used.   Injection:  1.25 mg Bevacizumab (AVASTIN) SOLN   NDC: 83662-947-65, Lot: [id number] , Expiration date: 05/31/2018   Route: Intravitreal, Site: Right Eye, Waste: 0 mL  Post-op Post injection exam found visual acuity of at least counting fingers. The patient tolerated the procedure well. There were no complications. The patient received written and verbal post procedure care education.        OCT, Retina - OU - Both Eyes       Right Eye Quality was good. Central Foveal Thickness: 229. Progression has worsened. Findings include retinal drusen , no SRF, no IRF, inner retinal atrophy, outer retinal atrophy, intraretinal hyper-reflective material, abnormal foveal contour (Interval development of IRF).   Left Eye Quality was good. Central Foveal Thickness: 225. Progression has been stable. Findings include normal foveal contour, no SRF, no IRF, retinal drusen .   Notes *Images captured and stored on drive  Diagnosis / Impression:  OD: CRVO with interval re-development of IRF; diffuse atrophy -- ?  RAO component  non-exu ARMD OU   Clinical management:  See below  Abbreviations: NFP - Normal foveal profile. CME - cystoid macular edema. PED - pigment epithelial detachment. IRF - intraretinal fluid. SRF - subretinal fluid. EZ - ellipsoid zone. ERM - epiretinal membrane. ORA - outer  retinal atrophy. ORT - outer retinal tubulation. SRHM - subretinal hyper-reflective material                 ASSESSMENT/PLAN:    ICD-10-CM   1. Central retinal vein occlusion with macular edema of right eye H34.8110 Intravitreal Injection, Pharmacologic Agent - OD - Right Eye    Bevacizumab (AVASTIN) SOLN 1.25 mg  2. Retinal edema H35.81 OCT, Retina - OU - Both Eyes  3. Intermediate stage nonexudative age-related macular degeneration of both eyes H35.3132   4. Essential hypertension I10   5. Hypertensive retinopathy of both eyes H35.033   6. Nevus of choroid of right eye D31.31   7. Pseudophakia of both eyes Z96.1     1,2. CRVO with CME  - s/p IVA OD #1 (01.22.20), #2 (02.18.20)  - delayed f/u due to COVID-19 restrictions at pt's assisted living facility  - BCVA OD: CF 3' (worse), OS: 20/30  - OCT shows interval re-development of IRF OD  - FA 2.19.2020 shows delayed filling time OD, no active leakage -- ?RAO component  - recommend IVA OD #3 today, 04.22.20  - pt wishes to proceed with treatment  - RBA of procedure discussed, questions answered  - informed consent obtained and signed  - see procedure note  - F/U 4 weeks -- DFE/OCT/possible injection  3. Age related macular degeneration, non-exudative, intermediate stage OU  - The incidence, anatomy, and pathology of dry AMD, risk of progression, and the AREDS and AREDS 2 study including smoking risks discussed with patient.  - recommend amsler grid monitoring  4,5. Hypertensive retinopathy OU  - discussed importance of tight BP control  - monitor  6. Choroidal Nevus OD  - ~3DD in diameter, round, located at 0930 equator  - +drusen; no orange pigment or SRF  - minimal elevation  -  baseline [employer] imaging obtained 2.19.2020  7. Pseudophakia OU  - s/p CE/IOL OU  - beautiful surgeries, doing well  - monitor   Ophthalmic Meds Ordered this visit:  Meds ordered this encounter  Medications  . Bevacizumab (AVASTIN)  SOLN 1.25 mg       Return in about 4 weeks (around 05/31/2018) for f/u CRVO OD, DFE, OCT.  There are no Patient Instructions on file for this visit.   Explained the diagnoses, plan, and follow up with the patient and they expressed understanding.  Patient expressed understanding of the importance of proper follow up care.   This document serves as a record of services personally performed by Gardiner Sleeper, MD, PhD. It was created on their behalf by Ernest Mallick, OA, an ophthalmic assistant. The creation of this record is the provider's dictation and/or activities during the visit.    Electronically signed by: Ernest Mallick, OA  04.22.2020 11:52 AM     Gardiner Sleeper, M.D., Ph.D. Diseases & Surgery of the Retina and Vitreous [facility]  I have reviewed the above documentation for accuracy and completeness, and I agree with the above. Gardiner Sleeper, M.D., Ph.D. 05/03/18 11:52 AM    Abbreviations: M myopia (nearsighted); A astigmatism; H hyperopia (farsighted); P presbyopia; Mrx spectacle prescription;  CTL contact lenses; OD right eye; OS  left eye; OU both eyes  XT exotropia; ET esotropia; PEK punctate epithelial keratitis; PEE punctate epithelial erosions; DES dry eye syndrome; MGD meibomian gland dysfunction; ATs artificial tears; PFAT's preservative free artificial tears; [place] nuclear sclerotic cataract; PSC posterior subcapsular cataract; ERM epi-retinal membrane; PVD posterior vitreous detachment; RD retinal detachment; DM diabetes mellitus; DR diabetic retinopathy; NPDR non-proliferative diabetic retinopathy; PDR proliferative diabetic retinopathy; CSME clinically significant macular edema; DME diabetic macular edema; dbh dot blot hemorrhages; CWS cotton wool spot; POAG primary open angle glaucoma; C/D cup-to-disc ratio; HVF humphrey visual field; GVF goldmann visual field; OCT optical coherence tomography; IOP intraocular pressure; BRVO Branch retinal vein  occlusion; CRVO central retinal vein occlusion; CRAO central retinal artery occlusion; BRAO branch retinal artery occlusion; RT retinal tear; SB scleral buckle; PPV pars plana vitrectomy; VH Vitreous hemorrhage; PRP panretinal laser photocoagulation; IVK intravitreal kenalog; VMT vitreomacular traction; MH Macular hole;  NVD neovascularization of the disc; NVE neovascularization elsewhere; AREDS age related eye disease study; ARMD age related macular degeneration; POAG primary open angle glaucoma; EBMD epithelial/anterior basement membrane dystrophy; ACIOL anterior chamber intraocular lens; IOL intraocular lens; PCIOL posterior chamber intraocular lens; Phaco/IOL phacoemulsification with intraocular lens placement; [place] photorefractive keratectomy; LASIK laser assisted in situ keratomileusis; HTN hypertension; DM diabetes mellitus; COPD chronic obstructive pulmonary disease

## 2018-05-23 ENCOUNTER — Telehealth: Payer: Self-pay | Admitting: Neurology

## 2018-05-23 NOTE — Telephone Encounter (Signed)
Dr. Jannifer Franklin,   I spoke with this patient today regarding her 5/18 appointment. She can't do a virtual visit and a telephone call may be a bit of a stretch... she is agreeable to having a face-to-face visit at this time, but I told her that I would check with you. She has a COVID-19 score of 5 which is under the limit, but her age is a concerning factor.  What would you advise in this situation? Do you think it would be acceptable to bring this patient in for a face-to-face visit?

## 2018-05-23 NOTE — Telephone Encounter (Signed)
Due to current COVID 19 pandemic, our office is severely reducing in office visits until further notice, in order to minimize the risk to our patients and healthcare providers.   Called patient back and spoke with her about her in office visit on 5/18. I explained that we are taking necessary precautions to keep patients and staff safe by only allowing just a few patients to come back into the office. Patient understands that when she arrives she will need to pull her car up to the entrance and wait for direction from a staff member. She is aware that she will have her temp taken and be asked screening questions about COVID-19. She is aware that RN will call her prior to appointment. She understands only one person may accompany her inside for this appointment and they will also have temp checked.

## 2018-05-24 NOTE — Telephone Encounter (Signed)
Noted, thanks!

## 2018-05-26 ENCOUNTER — Non-Acute Institutional Stay: Payer: Medicare Other | Admitting: Internal Medicine

## 2018-05-26 ENCOUNTER — Encounter: Payer: Self-pay | Admitting: Internal Medicine

## 2018-05-26 DIAGNOSIS — G629 Polyneuropathy, unspecified: Secondary | ICD-10-CM

## 2018-05-26 DIAGNOSIS — G3184 Mild cognitive impairment, so stated: Secondary | ICD-10-CM

## 2018-05-26 DIAGNOSIS — I4891 Unspecified atrial fibrillation: Secondary | ICD-10-CM | POA: Diagnosis not present

## 2018-05-26 NOTE — Progress Notes (Signed)
Location:  [facility] Room Number: 1/A Place of Service:  ALF [phone]) Provider:Oretta Berkland Meredith Staggers L,MD   [name], MD  Patient Care Team: [name], MD as PCP - General (Internal Medicine) Angelia Mould, MD as Attending Physician (Vascular Surgery) [name], MD (Inactive) as Attending Physician (Obstetrics and Gynecology) [name], MD (Orthopedic Surgery) [name], MD (Ophthalmology) [name], MD as Consulting Physician (Urology) [name], MD as Consulting Physician (Cardiology) [name], PA-C as Physician Assistant (Otolaryngology) [name], MD as Consulting Physician (Otolaryngology) [name], MD as Consulting Physician (Dermatology) [name], MD as Consulting Physician (Physical Medicine and Rehabilitation) [name], MD as Attending Physician (Gastroenterology) Mast, Man X, NP as Nurse Practitioner (Internal Medicine) Kathrynn Ducking, MD as Consulting Physician (Neurology) Debara Pickett Nadean Corwin, MD as Consulting Physician (Cardiology) Druscilla Brownie, MD as Consulting Physician (Dermatology) [name], [name], NP as Nurse Practitioner (Family Medicine)  Extended Emergency Contact Information Primary Emergency Contact: [name] of [place] Phone: [phone] Mobile Phone: [phone] Relation: Son Secondary Emergency Contact: [name] Mobile Phone: [phone] Relation: Friend  Code Status: DNR  Goals of care: Advanced Directive information Advanced Directives 05/26/2018  Does Patient Have a Medical Advance Directive? Yes  Type of Advance Directive [place]  Does patient want to make changes to medical advance directive? -  Copy of [place] in Chart? Yes - validated most recent copy scanned in chart (See row information)  Would patient like information on creating a medical advance directive? No - Patient declined     Chief Complaint  Patient presents with  . Medical Management of Chronic Issues    Routine visit,constipation   . Health Maintenance    due for tetanus vaccine     HPI:  Pt is a 85 y.o. female seen today for medical management of chronic diseases.   Patient has h/o chronic atrial fibrillation not on any coagulation, GERD, hypothyroidism, hyperlipidemia, neuropathy,, cognitive impairment, H/o Syncope Patient lives in [place].  She walks with her walker. She was seen in her room for regular visit. Her Only complain was constipation She says she sometimes feel dizzy if she doesnt eat right. No other issues Weight is stable  Past Medical History:  Diagnosis Date  . Abdominal bloating   . Atrial fibrillation ([place])   . Bruises easily   . Carotid artery occlusion    LEFT  . Dizziness   . DOE (dyspnea on exertion) 04/02/2014  . Fall at home Sept 2013, Dec. 2013  Jun 08, 2012  . GERD (gastroesophageal reflux disease) 02/11/2015  . Headache(784.0)   . Hoarseness   . Hypercholesterolemia   . Hyperglycemia 04/02/2014   Glucose 178 mg percent on 01/22/2014 04/05/14 Hgb A1c 6.6 Diet controlled.    . Hypothyroidism 04/15/2015  . Neuropathy    PERIPHERAL  . Pruritus   . Scoliosis   . Varicose veins    Past Surgical History:  Procedure Laterality Date  . ABDOMINAL HYSTERECTOMY  1954  . CHOLECYSTECTOMY  1997  . ESOPHAGOGASTRODUODENOSCOPY (EGD) WITH PROPOFOL N/A 11/25/2016   Procedure: ESOPHAGOGASTRODUODENOSCOPY (EGD) WITH PROPOFOL;  Surgeon: [name], MD;  Location: [facility];  Service: Endoscopy;  Laterality: N/A;  . SPINE SURGERY  1997   correct scoliosis    No Known Allergies  Outpatient Encounter Medications as of 05/26/2018  Medication Sig  . acetaminophen (TYLENOL) 325 MG tablet Take 650 mg by mouth every 8 (eight) hours  as needed.  . Artificial Saliva (SALIVAMAX) PACK Use as directed 1 Package in the mouth or throat. mix with 48mL's of H20; swish and spit BID; may keep  in bathroom; may self administer.  Marland Kitchen aspirin 81 MG chewable tablet Chew 81 mg by mouth daily.  Marland Kitchen atenolol (TENORMIN) 25 MG tablet Take 12.5 mg by mouth 2 (two) times daily. Hold if SBP < 100 or HR < 60  . Biotin 5000 MCG TABS Take 5,000 mcg by mouth daily.  . fluticasone (FLONASE) 50 MCG/ACT nasal spray Place 2 sprays into both nostrils daily.  . hydrogen peroxide 1.5 % SOLN Apply 1 application topically as needed (Swish and spit for dry mouth).  Marland Kitchen ipratropium (ATROVENT) 0.06 % nasal spray Place 2 sprays into both nostrils 2 (two) times daily as needed for rhinitis.  Marland Kitchen lactose free nutrition (BOOST PLUS) LIQD Take 237 mLs by mouth 2 (two) times daily with a meal. Per request of resident, states can't eat if takes it later   . Lidocaine 4 % PTCH Apply 1 patch topically. Remove patch at bedtime  . lidocaine-prilocaine (EMLA) cream Apply 1 application topically daily as needed.  . loratadine (CLARITIN) 10 MG tablet Take 10 mg by mouth daily as needed for allergies.  . Menthol, Topical Analgesic, (BIOFREEZE ROLL-ON) 4 % GEL Apply 1 application topically every 8 (eight) hours. Apply to LLE  . Multiple Vitamins-Minerals (MULTIVITAL) tablet Take 1 tablet by mouth daily.  . ondansetron (ZOFRAN) 4 MG tablet Take 1 tablet (4 mg total) by mouth every 6 (six) hours as needed for nausea.  . polyethylene glycol (MIRALAX / GLYCOLAX) packet Take 17 g daily as needed by mouth (for constipation).   . pregabalin (LYRICA) 25 MG capsule Take 1 capsule (25 mg total) by mouth 2 (two) times daily.  . sennosides-docusate sodium (SENOKOT-S) 8.6-50 MG tablet Take 1 tablet by mouth daily.  . sodium phosphate (FLEET) 7-19 GM/118ML ENEM Place 1 enema rectally as needed for severe constipation. Give Fleet enema per rectum X 1 after removing hard stool or if soft stool is present in rectum.  . traMADol (ULTRAM) 50 MG tablet Take 1 tablet (50 mg total) by mouth 2 (two) times daily. Also take one by mouth once daily as needed  .  triamcinolone cream (KENALOG) 0.1 % Apply 1 application topically as needed. Apply to itchy areas as needed. May keep in room and self apply  . [DISCONTINUED] lidocaine-prilocaine (EMLA) cream Apply 1 application topically as needed.  Marland Kitchen acetaminophen (TYLENOL) 500 MG tablet Take 500 mg by mouth 2 (two) times daily.    Facility-Administered Encounter Medications as of 05/26/2018  Medication  . Bevacizumab (AVASTIN) SOLN 1.25 mg  . Bevacizumab (AVASTIN) SOLN 1.25 mg    Review of Systems  Review of Systems  Constitutional: Negative for activity change, appetite change, chills, diaphoresis, fatigue and fever.  HENT: Negative for mouth sores, postnasal drip, rhinorrhea, sinus pain and sore throat.   Respiratory: Negative for apnea, cough, chest tightness, shortness of breath and wheezing.   Cardiovascular: Negative for chest pain, palpitations and leg swelling.  Gastrointestinal: Negative for abdominal distention, abdominal pain,  diarrhea, nausea and vomiting. Positive for Constipation Genitourinary: Negative for dysuria and frequency.  Musculoskeletal: Negative for arthralgias, joint swelling and myalgias.  Skin: Negative for rash.  Neurological: Negative for, syncope, weakness, light-headedness and numbness. Positive for Dizziness Psychiatric/Behavioral: Negative for behavioral problems, confusion and sleep disturbance.     Immunization History  Administered Date(s) Administered  . Influenza  Split 10/20/2011, 11/03/2012  . Influenza,inj,Quad PF,6+ Mos 09/29/2012, 11/09/2013  . Influenza-Unspecified 10/17/2014, 10/23/2015, 10/20/2016, 10/17/2017  . PPD Test 08/21/2013, 03/04/2014  . Pneumococcal Conjugate-13 08/21/2013  . Pneumococcal Polysaccharide-23 09/08/2016  . Tdap 05/20/2008  . Zoster 11/01/2012   Pertinent  Health Maintenance Due  Topic Date Due  . INFLUENZA VACCINE  08/12/2018  . DEXA SCAN  Completed  . PNA vac Low Risk Adult  Completed   Fall Risk  11/04/2017 09/05/2017  09/22/2016 08/25/2016 05/31/2016  Falls in the past year? Yes Yes Yes Yes No  Number falls in past yr: 2 or more 2 or more 1 2 or more -  Comment - - - - -  Injury with Fall? Yes Yes Yes Yes -  Comment - - fx 3 ribs - -  Risk Factor Category  - - - - -  Risk for fall due to : - - Impaired mobility;Impaired balance/gait - -  Risk for fall due to: Comment - - - - -   Functional Status Survey:    Vitals:   05/26/18 1223  BP: (!) 133/54  Pulse: 65  Resp: 16  Temp: (!) 97 F (36.1 C)  SpO2: 97%  Weight: 101 lb (45.8 kg)   Body mass index is 18.47 kg/m. Physical Exam  Constitutional: Oriented to person, place, and time. Well-developed and well-nourished.  HENT:  Head: Normocephalic.  Mouth/Throat: Oropharynx is clear and moist.  Eyes: Pupils are equal, round, and reactive to light.  Neck: Neck supple.  Cardiovascular: Normal rate and normal heart sounds.  No murmur heard. Pulmonary/Chest: Effort normal and breath sounds normal. No respiratory distress. No wheezes. She has no rales.  Abdominal: Soft. Bowel sounds are normal. No distension. There is no tenderness. There is no rebound.  Musculoskeletal: No edema.  Lymphadenopathy: none Neurological: Alert and oriented to person, place, and time.  Skin: Skin is warm and dry.  Psychiatric: Normal mood and affect. Behavior is normal. Thought content normal.    Labs reviewed: Recent Labs    08/04/17 03/06/18 03/30/18 1252 04/06/18  NA 140  --  134* 138  K 4.2  --  4.3 4.6  CL 103  --  100  --   CO2 29  --  26  --   GLUCOSE  --   --  109*  --   BUN 17 17 16 17   CREATININE 0.8 0.8 0.93 0.9  CALCIUM 9.3  --  9.7  --    Recent Labs    08/04/17 03/06/18 03/30/18 1252  AST 24 21 30   ALT 13 12 16   ALKPHOS 67  --  77  BILITOT  --   --  0.6  PROT 5.9  --  6.2*  ALBUMIN  --   --  3.6   Recent Labs    08/04/17 03/06/18 03/30/18 1252 04/06/18  WBC 6.2  --  8.1  --   HGB 12.2 12.4 13.2 13.2  HCT 35* 36 40.1 39  MCV  --   --   102.6*  --   PLT 198 217 196 200   Lab Results  Component Value Date   TSH 3.44 08/04/2017   Lab Results  Component Value Date   HGBA1C 6.4 07/07/2015   Lab Results  Component Value Date   CHOL 187 11/03/2017   HDL 84 (A) 11/03/2017   LDLCALC 80 11/03/2017   TRIG 136 11/03/2017   CHOLHDL 2.1 07/29/2016    Significant Diagnostic Results in last 30 days:  No results found.  Assessment/Plan Atrial fibrillation with RVR (HCC) Rate control on Lopressor Only on aspirin Not on any anticoagulation due to Falls and GI bleed  Neuropathy Stable on Lyrica  Mild cognitive impairment Continue Supportive care in [place] Constipation Will start her on Miralax QD for 3 days and then PRN Increase Senna S to 2 abs QD     Family/ staff Communication:   Labs/tests ordered:    Total time spent in this patient care encounter was  25_  minutes; greater than 50% of the visit spent counseling patient and staff, reviewing records , Labs and coordinating care for problems addressed at this encounter.

## 2018-05-29 ENCOUNTER — Other Ambulatory Visit: Payer: Self-pay | Admitting: *Deleted

## 2018-05-29 ENCOUNTER — Ambulatory Visit: Payer: Medicare Other | Admitting: Neurology

## 2018-05-29 MED ORDER — TRAMADOL HCL 50 MG PO TABS: 50.0000 mg | ORAL_TABLET | Freq: Two times a day (BID) | ORAL | 0 refills | Status: DC

## 2018-05-29 NOTE — Telephone Encounter (Signed)
Received fax order from [facility] and sent to Dr. Lyndel Safe for approval.

## 2018-05-30 NOTE — Addendum Note (Signed)
Addended by: Georgina Snell on: 05/30/2018 07:41 AM   Modules accepted: Level of Service

## 2018-05-31 ENCOUNTER — Other Ambulatory Visit: Payer: Self-pay

## 2018-05-31 ENCOUNTER — Ambulatory Visit (INDEPENDENT_AMBULATORY_CARE_PROVIDER_SITE_OTHER): Payer: Medicare Other | Admitting: Ophthalmology

## 2018-05-31 ENCOUNTER — Encounter (INDEPENDENT_AMBULATORY_CARE_PROVIDER_SITE_OTHER): Payer: Self-pay | Admitting: Ophthalmology

## 2018-05-31 DIAGNOSIS — H34811 Central retinal vein occlusion, right eye, with macular edema: Secondary | ICD-10-CM

## 2018-05-31 DIAGNOSIS — H35033 Hypertensive retinopathy, bilateral: Secondary | ICD-10-CM

## 2018-05-31 DIAGNOSIS — D3131 Benign neoplasm of right choroid: Secondary | ICD-10-CM

## 2018-05-31 DIAGNOSIS — Z961 Presence of intraocular lens: Secondary | ICD-10-CM | POA: Diagnosis not present

## 2018-05-31 DIAGNOSIS — H3581 Retinal edema: Secondary | ICD-10-CM

## 2018-05-31 DIAGNOSIS — H353132 Nonexudative age-related macular degeneration, bilateral, intermediate dry stage: Secondary | ICD-10-CM

## 2018-05-31 DIAGNOSIS — I10 Essential (primary) hypertension: Secondary | ICD-10-CM

## 2018-05-31 MED ORDER — BEVACIZUMAB CHEMO INJECTION 1.25MG/0.05ML SYRINGE FOR KALEIDOSCOPE
1.2500 mg | INTRAVITREAL | Status: AC | PRN
  Administered 2018-05-31: 1.25 mg via INTRAVITREAL

## 2018-05-31 NOTE — Progress Notes (Signed)
[facility] Note  05/31/2018     CHIEF COMPLAINT Patient presents for Retina Follow Up   HISTORY OF PRESENT ILLNESS: Vanessa [name] is a 85 y.o. female who presents to the clinic today for:   HPI    Retina Follow Up    Patient presents with  CRVO/BRVO.  In right eye.  Severity is moderate.  Duration of 4 weeks.  Since onset it is stable.  I, the attending physician,  performed the HPI with the patient and updated documentation appropriately.          Comments    Patient states vision the same OU.        Last edited by Bernarda Caffey, MD on 05/31/2018  3:03 PM. (History)    pt states her vision has not improved since last time, she states she does not feel like the injections are helping very much  Referring physician: [name], MD [place], [place] [phone]  HISTORICAL INFORMATION:   Selected notes from the MEDICAL RECORD NUMBER Referred by Dr. [name] for concern of CME LEE: 01.21.20 (RKaty Fitch) [BCVA: OD: OS:] Ocular Hx- PMH-    CURRENT MEDICATIONS: No current outpatient medications on file. (Ophthalmic Drugs)   No current facility-administered medications for this visit.  (Ophthalmic Drugs)   Current Outpatient Medications (Other)  Medication Sig  . acetaminophen (TYLENOL) 500 MG tablet Take 500 mg by mouth 2 (two) times daily.   . Artificial Saliva (SALIVAMAX) PACK Use as directed 1 Package in the mouth or throat. mix with 62mL's of H20; swish and spit BID; may keep in bathroom; may self administer.  Marland Kitchen aspirin 81 MG chewable tablet Chew 81 mg by mouth daily.  Marland Kitchen atenolol (TENORMIN) 25 MG tablet Take 12.5 mg by mouth 2 (two) times daily. Hold if SBP < 100 or HR < 60  . Biotin 5000 MCG TABS Take 5,000 mcg by mouth daily.  . fluticasone (FLONASE) 50 MCG/ACT nasal spray Place 2 sprays into both nostrils daily.  . hydrogen peroxide 1.5 % SOLN Apply 1 application topically as needed (Swish and spit for dry mouth).   Marland Kitchen ipratropium (ATROVENT) 0.06 % nasal spray Place 2 sprays into both nostrils 2 (two) times daily as needed for rhinitis.  Marland Kitchen lactose free nutrition (BOOST PLUS) LIQD Take 237 mLs by mouth 2 (two) times daily with a meal. Per request of resident, states can't eat if takes it later   . Lidocaine 4 % PTCH Apply 1 patch topically. Remove patch at bedtime  . lidocaine-prilocaine (EMLA) cream Apply 1 application topically daily as needed.  . loratadine (CLARITIN) 10 MG tablet Take 10 mg by mouth daily as needed for allergies.  . Menthol, Topical Analgesic, (BIOFREEZE ROLL-ON) 4 % GEL Apply 1 application topically every 8 (eight) hours. Apply to LLE  . Multiple Vitamins-Minerals (MULTIVITAL) tablet Take 1 tablet by mouth daily.  . ondansetron (ZOFRAN) 4 MG tablet Take 1 tablet (4 mg total) by mouth every 6 (six) hours as needed for nausea.  . polyethylene glycol (MIRALAX / GLYCOLAX) packet Take 17 g daily as needed by mouth (for constipation).   . pregabalin (LYRICA) 25 MG capsule Take 1 capsule (25 mg total) by mouth 2 (two) times daily.  . sennosides-docusate sodium (SENOKOT-S) 8.6-50 MG tablet Take 1 tablet by mouth daily.  . sodium phosphate (FLEET) 7-19 GM/118ML ENEM Place 1 enema rectally as needed for severe constipation. Give Fleet enema per rectum  X 1 after removing hard stool or if soft stool is present in rectum.  . traMADol (ULTRAM) 50 MG tablet Take 1 tablet (50 mg total) by mouth 2 (two) times daily. Also take one by mouth once daily as needed  . triamcinolone cream (KENALOG) 0.1 % Apply 1 application topically as needed. Apply to itchy areas as needed. May keep in room and self apply  . acetaminophen (TYLENOL) 325 MG tablet Take 650 mg by mouth every 8 (eight) hours as needed.   Current Facility-Administered Medications (Other)  Medication Route  . Bevacizumab (AVASTIN) SOLN 1.25 mg Intravitreal  . Bevacizumab (AVASTIN) SOLN 1.25 mg Intravitreal      REVIEW OF SYSTEMS: ROS     Positive for: Gastrointestinal, Neurological, Musculoskeletal, Cardiovascular, Eyes   Negative for: Constitutional, Skin, Genitourinary, HENT, Endocrine, Respiratory, Psychiatric, Allergic/Imm, Heme/Lymph   Last edited by Roselee Nova D on 05/31/2018  2:32 PM. (History)       ALLERGIES No Known Allergies  PAST MEDICAL HISTORY Past Medical History:  Diagnosis Date  . Abdominal bloating   . Atrial fibrillation ([place])   . Bruises easily   . Carotid artery occlusion    LEFT  . Dizziness   . DOE (dyspnea on exertion) 04/02/2014  . Fall at home Sept 2013, Dec. 2013  Jun 08, 2012  . GERD (gastroesophageal reflux disease) 02/11/2015  . Headache(784.0)   . Hoarseness   . Hypercholesterolemia   . Hyperglycemia 04/02/2014   Glucose 178 mg percent on 01/22/2014 04/05/14 Hgb A1c 6.6 Diet controlled.    . Hypothyroidism 04/15/2015  . Neuropathy    PERIPHERAL  . Pruritus   . Scoliosis   . Varicose veins    Past Surgical History:  Procedure Laterality Date  . ABDOMINAL HYSTERECTOMY  1954  . CHOLECYSTECTOMY  1997  . ESOPHAGOGASTRODUODENOSCOPY (EGD) WITH PROPOFOL N/A 11/25/2016   Procedure: ESOPHAGOGASTRODUODENOSCOPY (EGD) WITH PROPOFOL;  Surgeon: [name], MD;  Location: [facility];  Service: Endoscopy;  Laterality: N/A;  . [place]   correct scoliosis    FAMILY HISTORY Family History  Adopted: Yes  Problem Relation Age of Onset  . Diabetes Mother   . Heart attack Mother   . Heart disease Mother        After age 38  . Heart attack Father   . Stroke Father   . Breast cancer Sister   . Heart disease Maternal Grandmother   . Cancer Son        adopted son. esophagus, stomach, liver  . Stomach cancer Neg Hx   . Colon cancer Neg Hx     SOCIAL HISTORY Social History   Tobacco Use  . Smoking status: Never Smoker  . Smokeless tobacco: Never Used  Substance Use Topics  . Alcohol use: No    Alcohol/week: 0.0 standard drinks  . Drug use: No          OPHTHALMIC EXAM:  Base Eye Exam    Visual Acuity (Snellen - Linear)      Right Left   Dist cc 20/400 -1 20/25   Dist ph cc NI NI   Correction:  Glasses       Tonometry (Tonopen, 2:44 PM)      Right Left   Pressure 14 17       Pupils      Dark Light Shape React APD   Right 3 2 Round Minimal None   Left 3 2 Round Minimal None  Visual Fields (Counting fingers)      Left Right    Full Full       Extraocular Movement      Right Left    Full, Ortho Full, Ortho       Neuro/Psych    Oriented x3:  Yes   Mood/Affect:  Normal       Dilation    Both eyes:  1.0% Mydriacyl, 2.5% Phenylephrine @ 2:44 PM        Slit Lamp and Fundus Exam    Slit Lamp Exam      Right Left   Lids/Lashes Dermatochalasis - upper lid Dermatochalasis - upper lid, Meibomian gland dysfunction   Conjunctiva/Sclera White and quiet White and quiet   Cornea Arcus, 1-2+ diffuse Punctate epithelial erosions Arcus, 2+ diffuse Punctate epithelial erosions   Anterior Chamber deep and clear deep and clear   Iris Round and moderately dilated to 3mm Round and moderately dilated to 56mm   Lens PC IOL in good position with open PC PC IOL in good position with open PC   Vitreous Vitreous syneresis Vitreous syneresis       Fundus Exam      Right Left   Disc Pink and sharp, superior hyperemia, scattered disc hemes - improved, vascular loops superiorly Pink and Sharp   C/D Ratio 0.4 0.4   Macula Blunted foveal reflex, RPE mottling and clumping, diffuse IRH consist with CRVO, CWS, +cystic change Blunted foveal reflex, Drusen, RPE mottling and clumping, No heme or edema   Vessels Attenuated, very tortuous, CRVO Vascular attenuation, Tortuous   Periphery Attached, choroidal nevus at 0930 equator w/ overlying drusen, minimal elavation, no orange pigment, 3DD in diam, 360 DBH still quite significant Attached, reticular degeneration, peripheral cystoid degeneration          IMAGING AND PROCEDURES  Imaging  and Procedures for @TODAY @  OCT, Retina - OU - Both Eyes       Right Eye Quality was good. Central Foveal Thickness: 203. Progression has improved. Findings include retinal drusen , no SRF, inner retinal atrophy, outer retinal atrophy, intraretinal hyper-reflective material, normal foveal contour, intraretinal fluid (Mild Interval improvement in IRF and patchy ORA).   Left Eye Quality was good. Central Foveal Thickness: 223. Progression has been stable. Findings include normal foveal contour, no SRF, no IRF, retinal drusen .   Notes *Images captured and stored on drive  Diagnosis / Impression:  OD: CRVO with mild Interval improvement in IRF and patchy ORA non-exu ARMD OU   Clinical management:  See below  Abbreviations: NFP - Normal foveal profile. CME - cystoid macular edema. PED - pigment epithelial detachment. IRF - intraretinal fluid. SRF - subretinal fluid. EZ - ellipsoid zone. ERM - epiretinal membrane. ORA - outer retinal atrophy. ORT - outer retinal tubulation. SRHM - subretinal hyper-reflective material        Intravitreal Injection, Pharmacologic Agent - OD - Right Eye       Time Out 05/31/2018. 3:30 PM. Confirmed correct patient, procedure, site, and patient consented.   Anesthesia Topical anesthesia was used. Anesthetic medications included Lidocaine 2%, Proparacaine 0.5%.   Procedure Preparation included 5% betadine to ocular surface, eyelid speculum. A 30 gauge needle was used.   Injection:  1.25 mg Bevacizumab (AVASTIN) SOLN   NDC: 88416-606-30, Lot: [id number] , Expiration date: 06/22/2020   Route: Intravitreal, Site: Right Eye, Waste: 0 mL  Post-op Post injection exam found visual acuity of at least counting fingers. The patient tolerated the procedure well.  There were no complications. The patient received written and verbal post procedure care education.                 ASSESSMENT/PLAN:    ICD-10-CM   1. Central retinal vein occlusion  with macular edema of right eye H34.8110 Intravitreal Injection, Pharmacologic Agent - OD - Right Eye    Bevacizumab (AVASTIN) SOLN 1.25 mg  2. Retinal edema H35.81 OCT, Retina - OU - Both Eyes  3. Intermediate stage nonexudative age-related macular degeneration of both eyes H35.3132   4. Essential hypertension I10   5. Hypertensive retinopathy of both eyes H35.033   6. Nevus of choroid of right eye D31.31   7. Pseudophakia of both eyes Z96.1     1,2. CRVO with CME  - s/p IVA OD #1 (01.22.20), #2 (02.18.20), #3 (04.22.20)  - delayed f/u on 4.22.20 due to COVID-19 restrictions at pt's assisted living facility  - BCVA OD: 20/400- (improved from CF), OS: 20/30  - OCT shows interval improvement in IRF OD but significant central ORA  - FA 2.19.2020 shows delayed filling time OD, no active leakage -- ?RAO component  - recommend IVA OD #4 today, 05.20.20  - pt wishes to proceed with treatment  - RBA of procedure discussed, questions answered  - informed consent obtained and signed  - see procedure note  - F/U 5 weeks -- DFE/OCT/possible injection  3. Age related macular degeneration, non-exudative, intermediate stage OU5  - The incidence, anatomy, and pathology of dry AMD, risk of progression, and the AREDS and AREDS 2 study including smoking risks discussed with patient.  - recommend amsler grid monitoring  4,5. Hypertensive retinopathy OU  - discussed importance of tight BP control  - monitor  6. Choroidal Nevus OD  - ~3DD in diameter, round, located at 0930 equator  - +drusen; no orange pigment or SRF  - minimal elevation  - baseline Optos imaging obtained 2.19.2020  - stable today  - monitor  7. Pseudophakia OU  - s/p CE/IOL OU  - beautiful surgeries, doing well  - monitor   Ophthalmic Meds Ordered this visit:  Meds ordered this encounter  Medications  . Bevacizumab (AVASTIN) SOLN 1.25 mg       Return in about 5 weeks (around 07/05/2018) for f/u CRVO OD, DFE,  OCT.  There are no Patient Instructions on file for this visit.   Explained the diagnoses, plan, and follow up with the patient and they expressed understanding.  Patient expressed understanding of the importance of proper follow up care.   This document serves as a record of services personally performed by Gardiner Sleeper, MD, PhD. It was created on their behalf by Ernest Mallick, OA, an ophthalmic assistant. The creation of this record is the provider's dictation and/or activities during the visit.    Electronically signed by: Ernest Mallick, OA  05.20.2020 5:26 PM    Gardiner Sleeper, M.D., Ph.D. Diseases & Surgery of the Retina and Vitreous [facility]  I have reviewed the above documentation for accuracy and completeness, and I agree with the above. Gardiner Sleeper, M.D., Ph.D. 05/31/18 5:26 PM   Abbreviations: M myopia (nearsighted); A astigmatism; H hyperopia (farsighted); P presbyopia; Mrx spectacle prescription;  CTL contact lenses; OD right eye; OS left eye; OU both eyes  XT exotropia; ET esotropia; PEK punctate epithelial keratitis; PEE punctate epithelial erosions; DES dry eye syndrome; MGD meibomian gland dysfunction; ATs artificial tears; PFAT's preservative free artificial tears;  [employer] nuclear sclerotic cataract; PSC posterior subcapsular cataract; ERM epi-retinal membrane; PVD posterior vitreous detachment; RD retinal detachment; DM diabetes mellitus; DR diabetic retinopathy; NPDR non-proliferative diabetic retinopathy; PDR proliferative diabetic retinopathy; CSME clinically significant macular edema; DME diabetic macular edema; dbh dot blot hemorrhages; CWS cotton wool spot; POAG primary open angle glaucoma; C/D cup-to-disc ratio; HVF humphrey visual field; GVF goldmann visual field; OCT optical coherence tomography; IOP intraocular pressure; BRVO Branch retinal vein occlusion; CRVO central retinal vein occlusion; CRAO central retinal artery occlusion; BRAO  branch retinal artery occlusion; RT retinal tear; SB scleral buckle; PPV pars plana vitrectomy; VH Vitreous hemorrhage; PRP panretinal laser photocoagulation; IVK intravitreal kenalog; VMT vitreomacular traction; MH Macular hole;  NVD neovascularization of the disc; NVE neovascularization elsewhere; AREDS age related eye disease study; ARMD age related macular degeneration; POAG primary open angle glaucoma; EBMD epithelial/anterior basement membrane dystrophy; ACIOL anterior chamber intraocular lens; IOL intraocular lens; PCIOL posterior chamber intraocular lens; Phaco/IOL phacoemulsification with intraocular lens placement; [place] photorefractive keratectomy; LASIK laser assisted in situ keratomileusis; HTN hypertension; DM diabetes mellitus; COPD chronic obstructive pulmonary disease

## 2018-06-08 ENCOUNTER — Ambulatory Visit (INDEPENDENT_AMBULATORY_CARE_PROVIDER_SITE_OTHER): Payer: Medicare Other | Admitting: Neurology

## 2018-06-08 ENCOUNTER — Encounter: Payer: Self-pay | Admitting: Neurology

## 2018-06-08 ENCOUNTER — Other Ambulatory Visit: Payer: Self-pay

## 2018-06-08 VITALS — BP 108/59 | HR 83 | Temp 97.3°F | Ht 61.0 in | Wt 101.0 lb

## 2018-06-08 DIAGNOSIS — G629 Polyneuropathy, unspecified: Secondary | ICD-10-CM | POA: Diagnosis not present

## 2018-06-08 MED ORDER — PREGABALIN 25 MG PO CAPS: ORAL_CAPSULE | ORAL | 1 refills | Status: DC

## 2018-06-08 NOTE — Patient Instructions (Signed)
We will increase the Lyrica 25 mg capsule to one in the morning and 2 in the evening.  Lyrica (pregabalin) may cause drowsiness, gait instability, ankle swelling, or cognitive slowing as well as possible dizziness. If any significant side effects occur associated with this medication, please contact our office.

## 2018-06-08 NOTE — Progress Notes (Signed)
Reason for visit: Peripheral neuropathy, gait disturbance  Vanessa [name] is an 85 y.o. female  History of present illness:  Vanessa [name] is a 85 year old right-handed white female with a history of a peripheral neuropathy.  The patient reports numbness in her feet and cold sensations in the feet, she has lancinating pains going up the legs at times, left greater than right.  She notes that the pain is worse in the evening hours, if she gets up on her feet the pain improves but the pain worsens when she is off of her feet.  She is not very active during the day, but she denies any falls.  She uses a walker for ambulation.  [facility] lives at [facility].  She currently is on Lyrica 25 mg twice during the day, she is not getting full benefit with this medication.  Past Medical History:  Diagnosis Date  . Abdominal bloating   . Atrial fibrillation ([place])   . Bruises easily   . Carotid artery occlusion    LEFT  . Dizziness   . DOE (dyspnea on exertion) 04/02/2014  . Fall at home Sept 2013, Dec. 2013  Jun 08, 2012  . GERD (gastroesophageal reflux disease) 02/11/2015  . Headache(784.0)   . Hoarseness   . Hypercholesterolemia   . Hyperglycemia 04/02/2014   Glucose 178 mg percent on 01/22/2014 04/05/14 Hgb A1c 6.6 Diet controlled.    . Hypothyroidism 04/15/2015  . Neuropathy    PERIPHERAL  . Pruritus   . Scoliosis   . Varicose veins     Past Surgical History:  Procedure Laterality Date  . ABDOMINAL HYSTERECTOMY  1954  . CHOLECYSTECTOMY  1997  . ESOPHAGOGASTRODUODENOSCOPY (EGD) WITH PROPOFOL N/A 11/25/2016   Procedure: ESOPHAGOGASTRODUODENOSCOPY (EGD) WITH PROPOFOL;  Surgeon: [name], MD;  Location: [facility];  Service: Endoscopy;  Laterality: N/A;  . [place]   correct scoliosis    Family History  Adopted: Yes  Problem Relation Age of Onset  . Diabetes Mother   . Heart attack Mother   . Heart disease Mother        After age 15  . Heart attack Father    . Stroke Father   . Breast cancer Sister   . Heart disease Maternal Grandmother   . Cancer Son        adopted son. esophagus, stomach, liver  . Stomach cancer Neg Hx   . Colon cancer Neg Hx     Social history:  reports that she has never smoked. She has never used smokeless tobacco. She reports that she does not drink alcohol or use drugs.   No Known Allergies  Medications:  Prior to Admission medications   Medication Sig Start Date End Date Taking? Authorizing Provider  acetaminophen (TYLENOL) 325 MG tablet Take 650 mg by mouth every 8 (eight) hours as needed.    [provider]  acetaminophen (TYLENOL) 500 MG tablet Take 500 mg by mouth 2 (two) times daily.     [provider]  Artificial Saliva [facility] Xxx) PACK Use as directed 1 Package in the mouth or throat. mix with 19mL's of H20; swish and spit BID; may keep in bathroom; may self administer.    [provider]  aspirin 81 MG chewable tablet Chew 81 mg by mouth daily.    [provider]  atenolol (TENORMIN) 25 MG tablet Take 12.5 mg by mouth 2 (two) times daily. Hold if SBP < 100 or HR <  60    [provider]  Biotin 5000 MCG TABS Take 5,000 mcg by mouth daily.    [provider]  fluticasone (FLONASE) 50 MCG/ACT nasal spray Place 2 sprays into both nostrils daily.    [provider]  hydrogen peroxide 1.5 % SOLN Apply 1 application topically as needed (Swish and spit for dry mouth).    [provider]  ipratropium (ATROVENT) 0.06 % nasal spray Place 2 sprays into both nostrils 2 (two) times daily as needed for rhinitis.    [provider]  lactose free nutrition (BOOST PLUS) LIQD Take 237 mLs by mouth 2 (two) times daily with a meal. Per request of resident, states can't eat if takes it later     [provider]  Lidocaine 4 % PTCH Apply 1 patch topically. Remove patch at bedtime    [provider]  lidocaine-prilocaine (EMLA) cream  Apply 1 application topically daily as needed.    [provider]  loratadine (CLARITIN) 10 MG tablet Take 10 mg by mouth daily as needed for allergies.    [provider]  Menthol, Topical Analgesic, (BIOFREEZE ROLL-ON) 4 % GEL Apply 1 application topically every 8 (eight) hours. Apply to LLE    [provider]  Multiple Vitamins-Minerals (MULTIVITAL) tablet Take 1 tablet by mouth daily.    [provider]  ondansetron (ZOFRAN) 4 MG tablet Take 1 tablet (4 mg total) by mouth every 6 (six) hours as needed for nausea. 12/07/16   Allie Bossier, MD  polyethylene glycol [facility] / Floria Raveling) packet Take 17 g daily as needed by mouth (for constipation).     [provider]  pregabalin (LYRICA) 25 MG capsule Take 1 capsule (25 mg total) by mouth 2 (two) times daily. 04/05/18   [name], Dinah C, NP  sennosides-docusate sodium (SENOKOT-S) 8.6-50 MG tablet Take 1 tablet by mouth daily.    [provider]  sodium phosphate (FLEET) 7-19 GM/118ML ENEM Place 1 enema rectally as needed for severe constipation. Give Fleet enema per rectum X 1 after removing hard stool or if soft stool is present in rectum.    [provider]  traMADol (ULTRAM) 50 MG tablet Take 1 tablet (50 mg total) by mouth 2 (two) times daily. Also take one by mouth once daily as needed 05/29/18   [name], MD  triamcinolone cream (KENALOG) 0.1 % Apply 1 application topically as needed. Apply to itchy areas as needed. May keep in room and self apply    [provider]    ROS:  Out of a complete 14 system review of symptoms, the patient complains only of the following symptoms, and all other reviewed systems are negative.  Numbness Leg pain Arthritis of the knees  Blood pressure (!) 108/59, pulse 83, temperature (!) 97.3 F (36.3 C), temperature source Oral, height 5\' 1"  (1.549 m), weight 101 lb (45.8 kg).  Physical Exam  General: The patient is alert and  cooperative at the time of the examination.  Skin: No significant peripheral edema is noted.   Neurologic Exam  Mental status: The patient is alert and oriented x 3 at the time of the examination. The patient has apparent normal recent and remote memory, with an apparently normal attention span and concentration ability.   Cranial nerves: Facial symmetry is present. Speech is normal, no aphasia or dysarthria is noted.  At times, the patient does have some raspy qualities to the voice.  Extraocular movements are full.  Visual fields are full.  Motor: The patient has good strength in all 4 extremities.  Sensory examination: Soft touch sensation is symmetric on the face, arms, and legs.  Coordination: The patient has good finger-nose-finger and heel-to-shin bilaterally.  Gait and station: The patient has a wide-based gait, the patient usually uses a walker for ambulation.  Tandem gait was not attempted.  The patient has a positive Romberg.  Reflexes: Deep tendon reflexes are symmetric.   Assessment/Plan:  1.  Peripheral neuropathy  2.  Gait disturbance  The patient will be increased on Lyrica taking 25 mg in the morning and 50 mg in the evening.  She will call for any dose adjustments, otherwise she will follow-up in about 6 months.  Jill Alexanders MD 06/08/2018 10:56 AM  [facility] [address] [place], [place] [address]  Phone [phone] Fax [phone]

## 2018-06-16 DIAGNOSIS — H6121 Impacted cerumen, right ear: Secondary | ICD-10-CM | POA: Diagnosis not present

## 2018-06-16 DIAGNOSIS — K219 Gastro-esophageal reflux disease without esophagitis: Secondary | ICD-10-CM | POA: Diagnosis not present

## 2018-06-16 DIAGNOSIS — H903 Sensorineural hearing loss, bilateral: Secondary | ICD-10-CM | POA: Diagnosis not present

## 2018-06-16 DIAGNOSIS — R490 Dysphonia: Secondary | ICD-10-CM | POA: Diagnosis not present

## 2018-06-16 DIAGNOSIS — H838X3 Other specified diseases of inner ear, bilateral: Secondary | ICD-10-CM | POA: Diagnosis not present

## 2018-06-16 DIAGNOSIS — H1045 Other chronic allergic conjunctivitis: Secondary | ICD-10-CM | POA: Diagnosis not present

## 2018-07-04 NOTE — Progress Notes (Signed)
[facility] Note  07/05/2018     CHIEF COMPLAINT Patient presents for Retina Evaluation   HISTORY OF PRESENT ILLNESS: Vanessa [name] is a 85 y.o. female who presents to the clinic today for:   HPI    Retina Evaluation    In both eyes.  This started weeks ago.  Duration of weeks.  Context:  distance vision.  I, the attending physician,  performed the HPI with the patient and updated documentation appropriately.          Comments    Patient states her vision is stable in both eyes.  Patient denies eye pain or discomfort and denies any new or worsening floaters or fol OU.       Last edited by Bernarda Caffey, MD on 07/05/2018  3:19 PM. (History)    pt states she feels like her vision is okay, but she sees a black spot in her right eye that she cannot see through  Referring physician: [name], MD [place],  [place] [id number]  HISTORICAL INFORMATION:   Selected notes from the MEDICAL RECORD NUMBER Referred by Dr. [name] for concern of CME LEE: 01.21.20 (R. Groat) [BCVA: OD: OS:] Ocular Hx- PMH-    CURRENT MEDICATIONS: No current outpatient medications on file. (Ophthalmic Drugs)   No current facility-administered medications for this visit.  (Ophthalmic Drugs)   Current Outpatient Medications (Other)  Medication Sig  . acetaminophen (TYLENOL) 325 MG tablet Take 650 mg by mouth every 8 (eight) hours as needed.  Marland Kitchen acetaminophen (TYLENOL) 500 MG tablet Take 500 mg by mouth 2 (two) times daily.   . Artificial Saliva (SALIVAMAX) PACK Use as directed 1 Package in the mouth or throat. mix with 4mL's of H20; swish and spit BID; may keep in bathroom; may self administer.  Marland Kitchen aspirin 81 MG chewable tablet Chew 81 mg by mouth daily.  Marland Kitchen atenolol (TENORMIN) 25 MG tablet Take 12.5 mg by mouth 2 (two) times daily. Hold if SBP < 100 or HR < 60  . Biotin 5000 MCG TABS Take 5,000 mcg by mouth daily.  . fluticasone (FLONASE) 50  MCG/ACT nasal spray Place 2 sprays into both nostrils daily.  . hydrogen peroxide 1.5 % SOLN Apply 1 application topically as needed (Swish and spit for dry mouth).  Marland Kitchen ipratropium (ATROVENT) 0.06 % nasal spray Place 2 sprays into both nostrils 2 (two) times daily as needed for rhinitis.  Marland Kitchen lactose free nutrition (BOOST PLUS) LIQD Take 237 mLs by mouth 2 (two) times daily with a meal. Per request of resident, states can't eat if takes it later   . Lidocaine 4 % PTCH Apply 1 patch topically. Remove patch at bedtime  . lidocaine-prilocaine (EMLA) cream Apply 1 application topically daily as needed.  . loratadine (CLARITIN) 10 MG tablet Take 10 mg by mouth daily as needed for allergies.  . Menthol, Topical Analgesic, (BIOFREEZE ROLL-ON) 4 % GEL Apply 1 application topically every 8 (eight) hours. Apply to LLE  . Multiple Vitamins-Minerals (MULTIVITAL) tablet Take 1 tablet by mouth daily.  . ondansetron (ZOFRAN) 4 MG tablet Take 1 tablet (4 mg total) by mouth every 6 (six) hours as needed for nausea.  . polyethylene glycol (MIRALAX / GLYCOLAX) packet Take 17 g daily as needed by mouth (for constipation).   . pregabalin (LYRICA) 25 MG capsule One capsule in the morning and 2 in the evening  . sennosides-docusate sodium (SENOKOT-S) 8.6-50 MG tablet  Take 1 tablet by mouth daily.  . sodium phosphate (FLEET) 7-19 GM/118ML ENEM Place 1 enema rectally as needed for severe constipation. Give Fleet enema per rectum X 1 after removing hard stool or if soft stool is present in rectum.  . traMADol (ULTRAM) 50 MG tablet Take 1 tablet (50 mg total) by mouth 2 (two) times daily. Also take one by mouth once daily as needed  . triamcinolone cream (KENALOG) 0.1 % Apply 1 application topically as needed. Apply to itchy areas as needed. May keep in room and self apply   Current Facility-Administered Medications (Other)  Medication Route  . Bevacizumab (AVASTIN) SOLN 1.25 mg Intravitreal  . Bevacizumab (AVASTIN) SOLN 1.25  mg Intravitreal      REVIEW OF SYSTEMS: ROS    Positive for: Gastrointestinal, Neurological, Musculoskeletal, Endocrine, Eyes   Negative for: Constitutional, Skin, Genitourinary, HENT, Cardiovascular, Respiratory, Psychiatric, Allergic/Imm, Heme/Lymph   Last edited by Doneen Poisson on 07/05/2018  2:43 PM. (History)       ALLERGIES No Known Allergies  PAST MEDICAL HISTORY Past Medical History:  Diagnosis Date  . Abdominal bloating   . Atrial fibrillation ([place])   . Bruises easily   . Carotid artery occlusion    LEFT  . Dizziness   . DOE (dyspnea on exertion) 04/02/2014  . Fall at home Sept 2013, Dec. 2013  Jun 08, 2012  . GERD (gastroesophageal reflux disease) 02/11/2015  . Headache(784.0)   . Hoarseness   . Hypercholesterolemia   . Hyperglycemia 04/02/2014   Glucose 178 mg percent on 01/22/2014 04/05/14 Hgb A1c 6.6 Diet controlled.    . Hypothyroidism 04/15/2015  . Neuropathy    PERIPHERAL  . Pruritus   . Scoliosis   . Varicose veins    Past Surgical History:  Procedure Laterality Date  . ABDOMINAL HYSTERECTOMY  1954  . CHOLECYSTECTOMY  1997  . ESOPHAGOGASTRODUODENOSCOPY (EGD) WITH PROPOFOL N/A 11/25/2016   Procedure: ESOPHAGOGASTRODUODENOSCOPY (EGD) WITH PROPOFOL;  Surgeon: [name], MD;  Location: [facility];  Service: Endoscopy;  Laterality: N/A;  . [place]   correct scoliosis    FAMILY HISTORY Family History  Adopted: Yes  Problem Relation Age of Onset  . Diabetes Mother   . Heart attack Mother   . Heart disease Mother        After age 26  . Heart attack Father   . Stroke Father   . Breast cancer Sister   . Heart disease Maternal Grandmother   . Cancer Son        adopted son. esophagus, stomach, liver  . Stomach cancer Neg Hx   . Colon cancer Neg Hx     SOCIAL HISTORY Social History   Tobacco Use  . Smoking status: Never Smoker  . Smokeless tobacco: Never Used  Substance Use Topics  . Alcohol use: No    Alcohol/week:  0.0 standard drinks  . Drug use: No         OPHTHALMIC EXAM:  Base Eye Exam    Visual Acuity (Snellen - Linear)      Right Left   Dist cc 20/250 -1 20/25 -1   Dist ph cc NI NI       Tonometry (Tonopen, 2:48 PM)      Right Left   Pressure 17 18       Pupils      Dark Light Shape React APD   Right 2 1 Round Minimal 0   Left 2 1 Round Minimal  0       Extraocular Movement      Right Left    Full Full       Neuro/Psych    Oriented x3: Yes   Mood/Affect: Normal       Dilation    Both eyes: 1.0% Mydriacyl, 2.5% Phenylephrine @ 2:48 PM        Slit Lamp and Fundus Exam    Slit Lamp Exam      Right Left   Lids/Lashes Dermatochalasis - upper lid Dermatochalasis - upper lid, Meibomian gland dysfunction   Conjunctiva/Sclera White and quiet White and quiet   Cornea Arcus, 1-2+ diffuse Punctate epithelial erosions Arcus, 2+ diffuse Punctate epithelial erosions   Anterior Chamber deep and clear deep and clear   Iris Round and moderately dilated to 19mm Round and moderately dilated to 5mm   Lens PC IOL in good position with open PC PC IOL in good position with open PC   Vitreous Vitreous syneresis Vitreous syneresis       Fundus Exam      Right Left   Disc Mild pallor, sharp, superior hyperemia, scattered disc hemes - improved, vascular loops superiorly Pink and Sharp   C/D Ratio 0.4 0.4   Macula Blunted foveal reflex, RPE mottling and clumping, scattered IRH consist with CRVO and CWS - improving, +cystic change Blunted foveal reflex, Drusen, RPE mottling and clumping, No heme or edema   Vessels Attenuated, tortuous, CRVO Vascular attenuation, Tortuous   Periphery Attached, choroidal nevus at 0930 equator w/ overlying drusen, minimal elavation, no orange pigment, 3DD in diam, 360 DBH - improving Attached, reticular degeneration, peripheral cystoid degeneration        Refraction    Wearing Rx      Sphere Cylinder Axis Add   Right -2.75 +2.75 010 +3.25   Left -3.75  +3.25 011 +3.25          IMAGING AND PROCEDURES  Imaging and Procedures for @TODAY @  OCT, Retina - OU - Both Eyes       Right Eye Quality was good. Central Foveal Thickness: 202. Progression has improved. Findings include retinal drusen , no SRF, inner retinal atrophy, outer retinal atrophy, intraretinal hyper-reflective material, normal foveal contour, no IRF (Interval improvement in IRF).   Left Eye Quality was good. Central Foveal Thickness: 226. Progression has been stable. Findings include normal foveal contour, no SRF, no IRF, retinal drusen , outer retinal atrophy (Mild patchy ORA).   Notes *Images captured and stored on drive  Diagnosis / Impression:  OD: CRVO with Interval improvement in IRF and patchy ORA non-exu ARMD OU   Clinical management:  See below  Abbreviations: NFP - Normal foveal profile. CME - cystoid macular edema. PED - pigment epithelial detachment. IRF - intraretinal fluid. SRF - subretinal fluid. EZ - ellipsoid zone. ERM - epiretinal membrane. ORA - outer retinal atrophy. ORT - outer retinal tubulation. SRHM - subretinal hyper-reflective material        Intravitreal Injection, Pharmacologic Agent - OD - Right Eye       Time Out 07/05/2018. 3:20 PM. Confirmed correct patient, procedure, site, and patient consented.   Anesthesia Topical anesthesia was used. Anesthetic medications included Lidocaine 2%, Proparacaine 0.5%.   Procedure Preparation included 5% betadine to ocular surface, eyelid speculum. A supplied needle was used.   Injection:  1.25 mg Bevacizumab (AVASTIN) SOLN   NDC: 40973-532-99, Lot: [id number] , Expiration date: 08/16/2018   Route: Intravitreal, Site: Right Eye, Waste: 0 mL  Post-op  Post injection exam found visual acuity of at least counting fingers. The patient tolerated the procedure well. There were no complications. The patient received written and verbal post procedure care education.                  ASSESSMENT/PLAN:    ICD-10-CM   1. Central retinal vein occlusion with macular edema of right eye  H34.8110 Intravitreal Injection, Pharmacologic Agent - OD - Right Eye  2. Retinal edema  H35.81 OCT, Retina - OU - Both Eyes  3. Intermediate stage nonexudative age-related macular degeneration of both eyes  H35.3132   4. Essential hypertension  I10   5. Hypertensive retinopathy of both eyes  H35.033   6. Nevus of choroid of right eye  D31.31   7. Pseudophakia of both eyes  Z96.1     1,2. CRVO with CME  - s/p IVA OD #1 (01.22.20), #2 (02.18.20), #3 (04.22.20),#4 (05.20.20)  - delayed f/u on 4.22.20 due to COVID-19 restrictions at pt's assisted living facility  - BCVA OD: 20/250 (improved from CF), OS: 20/30  - OCT shows interval improvement in IRF OD but significant central ORA  - FA 2.19.2020 shows delayed filling time OD, no active leakage -- ?RAO component  - recommend IVA OD #5 today, 06.24.20  - pt wishes to proceed with treatment  - RBA of procedure discussed, questions answered  - informed consent obtained and signed  - see procedure note  - F/U 4-5 weeks -- DFE/OCT/possible injection  3. Age related macular degeneration, non-exudative, intermediate stage OU5  - The incidence, anatomy, and pathology of dry AMD, risk of progression, and the AREDS and AREDS 2 study including smoking risks discussed with patient.  - recommend amsler grid monitoring  4,5. Hypertensive retinopathy OU  - discussed importance of tight BP control  - monitor  6. Choroidal Nevus OD  - ~3DD in diameter, round, located at 0930 equator  - +drusen; no orange pigment or SRF  - minimal elevation  - baseline Opt[employer] imaging obtained 2.19.2020  - stable today  - monitor  7. Pseudophakia OU  - s/p CE/IOL OU  - beautiful surgeries, doing well  - monitor   Ophthalmic Meds Ordered this visit:  No orders of the defined types were placed in this encounter.      Return in about 5 weeks (around  08/09/2018) for f/u CRVO OD, DFE, OCT.  There are no Patient Instructions on file for this visit.   Explained the diagnoses, plan, and follow up with the patient and they expressed understanding.  Patient expressed understanding of the importance of proper follow up care.   This document serves as a record of services personally performed by Gardiner Sleeper, MD, PhD. It was created on their behalf by Ernest Mallick, OA, an ophthalmic assistant. The creation of this record is the provider's dictation and/or activities during the visit.    Electronically signed by: Ernest Mallick, OA  06.23.2020 5:53 PM    Gardiner Sleeper, M.D., Ph.D. Diseases & Surgery of the Retina and Vitreous [facility]  I have reviewed the above documentation for accuracy and completeness, and I agree with the above. Gardiner Sleeper, M.D., Ph.D. 07/05/18 5:53 PM    Abbreviations: M myopia (nearsighted); A astigmatism; H hyperopia (farsighted); P presbyopia; Mrx spectacle prescription;  CTL contact lenses; OD right eye; OS left eye; OU both eyes  XT exotropia; ET esotropia; PEK punctate epithelial keratitis; PEE punctate epithelial erosions; DES  dry eye syndrome; MGD meibomian gland dysfunction; ATs artificial tears; PFAT's preservative free artificial tears; [place] nuclear sclerotic cataract; PSC posterior subcapsular cataract; ERM epi-retinal membrane; PVD posterior vitreous detachment; RD retinal detachment; DM diabetes mellitus; DR diabetic retinopathy; NPDR non-proliferative diabetic retinopathy; PDR proliferative diabetic retinopathy; CSME clinically significant macular edema; DME diabetic macular edema; dbh dot blot hemorrhages; CWS cotton wool spot; POAG primary open angle glaucoma; C/D cup-to-disc ratio; HVF humphrey visual field; GVF goldmann visual field; OCT optical coherence tomography; IOP intraocular pressure; BRVO Branch retinal vein occlusion; CRVO central retinal vein occlusion; CRAO central  retinal artery occlusion; BRAO branch retinal artery occlusion; RT retinal tear; SB scleral buckle; PPV pars plana vitrectomy; VH Vitreous hemorrhage; PRP panretinal laser photocoagulation; IVK intravitreal kenalog; VMT vitreomacular traction; MH Macular hole;  NVD neovascularization of the disc; NVE neovascularization elsewhere; AREDS age related eye disease study; ARMD age related macular degeneration; POAG primary open angle glaucoma; EBMD epithelial/anterior basement membrane dystrophy; ACIOL anterior chamber intraocular lens; IOL intraocular lens; PCIOL posterior chamber intraocular lens; Phaco/IOL phacoemulsification with intraocular lens placement; [place] photorefractive keratectomy; LASIK laser assisted in situ keratomileusis; HTN hypertension; DM diabetes mellitus; COPD chronic obstructive pulmonary disease

## 2018-07-05 ENCOUNTER — Ambulatory Visit (INDEPENDENT_AMBULATORY_CARE_PROVIDER_SITE_OTHER): Payer: Medicare Other | Admitting: Ophthalmology

## 2018-07-05 ENCOUNTER — Other Ambulatory Visit: Payer: Self-pay

## 2018-07-05 ENCOUNTER — Encounter (INDEPENDENT_AMBULATORY_CARE_PROVIDER_SITE_OTHER): Payer: Self-pay | Admitting: Ophthalmology

## 2018-07-05 DIAGNOSIS — H3581 Retinal edema: Secondary | ICD-10-CM

## 2018-07-05 DIAGNOSIS — H35033 Hypertensive retinopathy, bilateral: Secondary | ICD-10-CM | POA: Diagnosis not present

## 2018-07-05 DIAGNOSIS — H34811 Central retinal vein occlusion, right eye, with macular edema: Secondary | ICD-10-CM

## 2018-07-05 DIAGNOSIS — Z961 Presence of intraocular lens: Secondary | ICD-10-CM

## 2018-07-05 DIAGNOSIS — D3131 Benign neoplasm of right choroid: Secondary | ICD-10-CM | POA: Diagnosis not present

## 2018-07-05 DIAGNOSIS — I10 Essential (primary) hypertension: Secondary | ICD-10-CM | POA: Diagnosis not present

## 2018-07-05 DIAGNOSIS — H353132 Nonexudative age-related macular degeneration, bilateral, intermediate dry stage: Secondary | ICD-10-CM | POA: Diagnosis not present

## 2018-07-05 MED ORDER — BEVACIZUMAB CHEMO INJECTION 1.25MG/0.05ML SYRINGE FOR KALEIDOSCOPE
1.2500 mg | INTRAVITREAL | Status: AC | PRN
  Administered 2018-07-05: 1.25 mg via INTRAVITREAL

## 2018-07-08 DIAGNOSIS — Z03818 Encounter for observation for suspected exposure to other biological agents ruled out: Secondary | ICD-10-CM | POA: Diagnosis not present

## 2018-07-10 LAB — NOVEL CORONAVIRUS, NAA: SARS-CoV-2, NAA: NOT DETECTED

## 2018-07-21 DIAGNOSIS — K219 Gastro-esophageal reflux disease without esophagitis: Secondary | ICD-10-CM | POA: Diagnosis not present

## 2018-07-21 DIAGNOSIS — R490 Dysphonia: Secondary | ICD-10-CM | POA: Diagnosis not present

## 2018-07-27 ENCOUNTER — Telehealth: Payer: Self-pay | Admitting: Neurology

## 2018-07-27 NOTE — Telephone Encounter (Signed)
Pt called in and stated she is having bad pain in her legs and she wants to if its from her neuropathy.  CB# [phone]

## 2018-07-27 NOTE — Telephone Encounter (Signed)
I called the patient, left message.  If the pain is consistent from 1 day to the next, it may be related to the peripheral neuropathy, we did go up on the dose of the Lyrica on her last visit, but we can continue to increase the dose as needed, she needs to contact her office if this pain does not abate.

## 2018-07-27 NOTE — Telephone Encounter (Signed)
I reached out to the pt. She states yesterday she started having pain in her legs and wanted to know if this could be related to her neuropathy. I advised it could be related. Pt is rx'd Lyrica 25 mg 1 in the am and 2 in the pm. Pt states since she is in an assisted living she does not take her medication independently. Pt states pain has not been present today.   I inquired if she would be agreeable to coming in to see NP and she stated she would be if Dr. Jannifer Franklin thought it best.  I advised the pt I would send a message to Dr. Jannifer Franklin so he could further review and advise.  Best CB # [phone].

## 2018-08-02 ENCOUNTER — Non-Acute Institutional Stay: Payer: Medicare Other | Admitting: Family

## 2018-08-02 ENCOUNTER — Encounter: Payer: Self-pay | Admitting: Family

## 2018-08-02 DIAGNOSIS — I4891 Unspecified atrial fibrillation: Secondary | ICD-10-CM | POA: Diagnosis not present

## 2018-08-02 DIAGNOSIS — M8949 Other hypertrophic osteoarthropathy, multiple sites: Secondary | ICD-10-CM

## 2018-08-02 DIAGNOSIS — G629 Polyneuropathy, unspecified: Secondary | ICD-10-CM

## 2018-08-02 DIAGNOSIS — M159 Polyosteoarthritis, unspecified: Secondary | ICD-10-CM

## 2018-08-02 DIAGNOSIS — M150 Primary generalized (osteo)arthritis: Secondary | ICD-10-CM

## 2018-08-02 DIAGNOSIS — K5901 Slow transit constipation: Secondary | ICD-10-CM

## 2018-08-02 NOTE — Progress Notes (Signed)
Location:  [facility] Room Number: 1 Place of Service:  ALF [phone]) Provider: Dinah [name] FNP-C   [name], MD  Patient Care Team: [name], MD as PCP - General (Internal Medicine) Angelia Mould, MD as Attending Physician (Vascular Surgery) [name], MD (Inactive) as Attending Physician (Obstetrics and Gynecology) [name], MD (Orthopedic Surgery) [name], MD (Ophthalmology) [name], MD as Consulting Physician (Urology) [name], MD as Consulting Physician (Cardiology) [name], PA-C as Physician Assistant (Otolaryngology) [name], MD as Consulting Physician (Otolaryngology) [name], MD as Consulting Physician (Dermatology) [name], MD as Consulting Physician (Physical Medicine and Rehabilitation) [name], MD as Attending Physician (Gastroenterology) Mast, Man X, NP as Nurse Practitioner (Internal Medicine) Kathrynn Ducking, MD as Consulting Physician (Neurology) Debara Pickett Nadean Corwin, MD as Consulting Physician (Cardiology) Druscilla Brownie, MD as Consulting Physician (Dermatology) [name], [name], NP as Nurse Practitioner (Family Medicine)  Extended Emergency Contact Information Primary Emergency Contact: [name] of [place] Phone: [phone] Mobile Phone: [phone] Relation: Son Secondary Emergency Contact: [name] Mobile Phone: [phone] Relation: Friend  Code Status:  Full Code  Goals of care: Advanced Directive information Advanced Directives 08/02/2018  Does Patient Have a Medical Advance Directive? Yes  Type of Advance Directive [place]  Does patient want to make changes to medical advance directive? No - Patient declined  Copy of [place] in Chart? Yes - validated most recent copy scanned in chart (See row information)  Would patient like information on creating a medical advance  directive? -     Chief Complaint  Patient presents with  . Medical Management of Chronic Issues    Routine Visit    HPI:  Pt is a 85 y.o. female seen today [facility] for medical management of chronic diseases.she is seen in her room today reading newspaper.she denies any acute issues.she has a medical history of Chronic Afib,Hyperlipidemia,Peripheral Neuropathy,hypothyroidism,GERD among other condition.she has had no recent fall episode or weight changes.Facility Nurse reports no new concerns.patient states has schedule an appointment with Neurologist for her chronic right lower extremity leg pain.Also follows up with Ophthalmology for macular degeneration states has lost most vision on her right eye.No fever ,chills or cough reported.   Past Medical History:  Diagnosis Date  . Abdominal bloating   . Atrial fibrillation ([place])   . Bruises easily   . Carotid artery occlusion    LEFT  . Dizziness   . DOE (dyspnea on exertion) 04/02/2014  . Fall at home Sept 2013, Dec. 2013  Jun 08, 2012  . GERD (gastroesophageal reflux disease) 02/11/2015  . Headache(784.0)   . Hoarseness   . Hypercholesterolemia   . Hyperglycemia 04/02/2014   Glucose 178 mg percent on 01/22/2014 04/05/14 Hgb A1c 6.6 Diet controlled.    . Hypothyroidism 04/15/2015  . Neuropathy    PERIPHERAL  . Pruritus   . Scoliosis   . Varicose veins    Past Surgical History:  Procedure Laterality Date  . ABDOMINAL HYSTERECTOMY  1954  . CHOLECYSTECTOMY  1997  . ESOPHAGOGASTRODUODENOSCOPY (EGD) WITH PROPOFOL N/A 11/25/2016   Procedure: ESOPHAGOGASTRODUODENOSCOPY (EGD) WITH PROPOFOL;  Surgeon: [name], MD;  Location: [facility];  Service: Endoscopy;  Laterality: N/A;  . SPINE SURGERY  1997   correct scoliosis    No Known Allergies  Allergies as of 08/02/2018   No Known Allergies     Medication List  Accurate as of August 02, 2018  5:03 PM. If you have any questions, ask your nurse or doctor.         STOP taking these medications   ondansetron 4 MG tablet Commonly known as: ZOFRAN Stopped by: Sandrea Hughs, NP     TAKE these medications   acetaminophen 500 MG tablet Commonly known as: TYLENOL Take 500 mg by mouth 2 (two) times daily.   acetaminophen 325 MG tablet Commonly known as: TYLENOL Take 650 mg by mouth every 8 (eight) hours as needed.   aspirin 81 MG chewable tablet Chew 81 mg by mouth daily.   atenolol 25 MG tablet Commonly known as: TENORMIN Take 12.5 mg by mouth 2 (two) times daily. Hold if SBP < 100 or HR < 60   Biofreeze Roll-On 4 % Gel Generic drug: Menthol (Topical Analgesic) Apply 1 application topically every 8 (eight) hours. Apply to LLE   Biotin 5000 MCG Tabs Take 5,000 mcg by mouth daily.   famotidine 20 MG tablet Commonly known as: PEPCID Take 20 mg by mouth daily.   fluticasone 50 MCG/ACT nasal spray Commonly known as: FLONASE Place 2 sprays into both nostrils daily.   hydrogen peroxide 1.5 % Soln Apply 1 application topically as needed (Swish and spit for dry mouth).   ipratropium 0.06 % nasal spray Commonly known as: ATROVENT Place 2 sprays into both nostrils 2 (two) times daily as needed for rhinitis.   lactose free nutrition Liqd Take 237 mLs by mouth 2 (two) times daily with a meal. Per request of resident, states can't eat if takes it later   Lidocaine 4 % Ptch Apply 1 patch topically. Remove patch at bedtime   lidocaine-prilocaine cream Commonly known as: EMLA Apply 1 application topically daily as needed.   loratadine 10 MG tablet Commonly known as: CLARITIN Take 10 mg by mouth daily as needed for allergies.   Multivital tablet Take 1 tablet by mouth daily.   polyethylene glycol 17 g packet Commonly known as: MIRALAX / GLYCOLAX Take 17 g daily as needed by mouth (for constipation).   pregabalin 25 MG capsule Commonly known as: Lyrica One capsule in the morning and 2 in the evening   SalivaMAX Pack Use as  directed 1 Package in the mouth or throat. mix with 4mL's of H20; swish and spit BID; may keep in bathroom; may self administer.   sennosides-docusate sodium 8.6-50 MG tablet Commonly known as: SENOKOT-S Take 2 tablets by mouth daily.   sodium phosphate 7-19 GM/118ML Enem Place 1 enema rectally as needed for severe constipation. Give Fleet enema per rectum X 1 after removing hard stool or if soft stool is present in rectum.   traMADol 50 MG tablet Commonly known as: ULTRAM Take 1 tablet (50 mg total) by mouth 2 (two) times daily. Also take one by mouth once daily as needed   triamcinolone cream 0.1 % Commonly known as: KENALOG Apply 1 application topically as needed. Apply to itchy areas as needed. May keep in room and self apply       Review of Systems  Constitutional: Negative for appetite change, chills, fatigue, fever and unexpected weight change.  HENT: Positive for hearing loss. Negative for congestion, rhinorrhea, sinus pressure, sinus pain, sneezing and sore throat.   Eyes: Positive for visual disturbance. Negative for pain, discharge, redness and itching.  Respiratory: Negative for cough, chest tightness, shortness of breath and wheezing.   Cardiovascular: Negative for chest pain, palpitations and leg swelling.  Gastrointestinal: Negative for abdominal distention, abdominal pain, constipation, diarrhea, nausea and vomiting.  Endocrine: Negative for cold intolerance, heat intolerance, polydipsia, polyphagia and polyuria.  Genitourinary: Negative for difficulty urinating, dysuria, flank pain, frequency and urgency.  Musculoskeletal: Positive for arthralgias and gait problem.  Skin: Negative for color change, pallor, rash and wound.  Neurological: Negative for dizziness, syncope, weakness, light-headedness, numbness and headaches.  Hematological: Does not bruise/bleed easily.  Psychiatric/Behavioral: Negative for agitation, confusion and sleep disturbance. The patient is not  nervous/anxious.     Immunization History  Administered Date(s) Administered  . Influenza Split 10/20/2011, 11/03/2012  . Influenza,inj,Quad PF,6+ Mos 09/29/2012, 11/09/2013  . Influenza-Unspecified 10/17/2014, 10/23/2015, 10/20/2016, 10/17/2017  . PPD Test 08/21/2013, 03/04/2014  . Pneumococcal Conjugate-13 08/21/2013  . Pneumococcal Polysaccharide-23 09/08/2016  . Tdap 05/20/2008  . Zoster 11/01/2012   Pertinent  Health Maintenance Due  Topic Date Due  . INFLUENZA VACCINE  08/12/2018  . DEXA SCAN  Completed  . PNA vac Low Risk Adult  Completed   Fall Risk  11/04/2017 09/05/2017 09/22/2016 08/25/2016 05/31/2016  Falls in the past year? Yes Yes Yes Yes No  Number falls in past yr: 2 or more 2 or more 1 2 or more -  Comment - - - - -  Injury with Fall? Yes Yes Yes Yes -  Comment - - fx 3 ribs - -  Risk Factor Category  - - - - -  Risk for fall due to : - - Impaired mobility;Impaired balance/gait - -  Risk for fall due to: Comment - - - - -   Functional Status Survey:    Vitals:   08/02/18 1440  BP: 138/72  Pulse: 70  Resp: 20  Temp: (!) 97.2 F (36.2 C)  TempSrc: Oral  SpO2: 91%  Weight: 102 lb (46.3 kg)  Height: 5\' 2"  (1.575 m)   Body mass index is 18.66 kg/m. Physical Exam Vitals signs and nursing note reviewed.  Constitutional:      General: She is not in acute distress.    Appearance: She is underweight. She is not ill-appearing.  HENT:     Head: Normocephalic.     Right Ear: Tympanic membrane, ear canal and external ear normal. There is no impacted cerumen.     Left Ear: Tympanic membrane, ear canal and external ear normal. There is no impacted cerumen.     Nose: Nose normal. No congestion or rhinorrhea.     Mouth/Throat:     Mouth: Mucous membranes are moist.     Pharynx: Oropharynx is clear. No oropharyngeal exudate or posterior oropharyngeal erythema.  Eyes:     General: No scleral icterus.       Right eye: No discharge.        Left eye: No discharge.      Conjunctiva/sclera: Conjunctivae normal.     Pupils: Pupils are equal, round, and reactive to light.  Neck:     Musculoskeletal: Normal range of motion. No neck rigidity or muscular tenderness.  Cardiovascular:     Rate and Rhythm: Normal rate. Rhythm irregular.     Pulses: Normal pulses.     Heart sounds: Murmur present. No friction rub. No gallop.      Comments: Bilateral lower extremities prominent varicose veins right > left  Pulmonary:     Effort: Pulmonary effort is normal. No respiratory distress.     Breath sounds: Normal breath sounds. No wheezing, rhonchi or rales.  Chest:     Chest wall: No tenderness.  Abdominal:     General: Bowel sounds are normal. There is no distension.     Palpations: Abdomen is soft. There is no mass.     Tenderness: There is no abdominal tenderness. There is no right CVA tenderness, left CVA tenderness, guarding or rebound.  Musculoskeletal:        General: No swelling or tenderness.     Right lower leg: No edema.     Left lower leg: No edema.     Comments: Unsteady gait walks with walker   Lymphadenopathy:     Cervical: No cervical adenopathy.  Skin:    General: Skin is warm and dry.     Coloration: Skin is not pale.     Findings: No bruising, erythema, lesion or rash.  Neurological:     Mental Status: She is alert and oriented to person, place, and time.     Cranial Nerves: No cranial nerve deficit.     Motor: No weakness.     Gait: Gait abnormal.     Comments: HOH   Psychiatric:        Mood and Affect: Mood normal.        Behavior: Behavior normal.        Thought Content: Thought content normal.        Judgment: Judgment normal.    Labs reviewed: Recent Labs    08/04/17 03/06/18 03/30/18 1252 04/06/18  NA 140  --  134* 138  K 4.2  --  4.3 4.6  CL 103  --  100  --   CO2 29  --  26  --   GLUCOSE  --   --  109*  --   BUN 17 17 16 17   CREATININE 0.8 0.8 0.93 0.9  CALCIUM 9.3  --  9.7  --    Recent Labs    08/04/17  03/06/18 03/30/18 1252  AST 24 21 30   ALT 13 12 16   ALKPHOS 67  --  77  BILITOT  --   --  0.6  PROT 5.9  --  6.2*  ALBUMIN  --   --  3.6   Recent Labs    08/04/17 03/06/18 03/30/18 1252 04/06/18  WBC 6.2  --  8.1  --   HGB 12.2 12.4 13.2 13.2  HCT 35* 36 40.1 39  MCV  --   --  102.6*  --   PLT 198 217 196 200   Lab Results  Component Value Date   TSH 3.44 08/04/2017   Lab Results  Component Value Date   HGBA1C 6.4 07/07/2015   Lab Results  Component Value Date   CHOL 187 11/03/2017   HDL 84 (A) 11/03/2017   LDLCALC 80 11/03/2017   TRIG 136 11/03/2017   CHOLHDL 2.1 07/29/2016    Significant Diagnostic Results in last 30 days:  No results found.  Assessment/Plan 1. Atrial fibrillation with RVR (HCC) HR controlled.continue on Atenolol 12.5 mg tablet twice daily. On ASA not a candidate for anticoagulant due to her advance age and high risk for falls.   2. Neuropathy Continue on Lyrica 25 mg capsule twice daily.   3. Primary osteoarthritis involving multiple joints Stable on current regimen.continue on Extra strength tylenol,lodocaine patch,EMLA,Biofreeze and Tramadol   4. Slow transit constipation Continue miralax 17 gm daily as needed and Senokot -S 2 tablet daily.encouraged hydration.   Family/ staff Communication: Reviewed plan of care with patient and facility Nurse.    Labs/tests ordered: None   [place]  Shelva Majestic, NP

## 2018-08-09 ENCOUNTER — Encounter (INDEPENDENT_AMBULATORY_CARE_PROVIDER_SITE_OTHER): Payer: Medicare Other | Admitting: Ophthalmology

## 2018-08-10 ENCOUNTER — Other Ambulatory Visit: Payer: Self-pay

## 2018-08-10 MED ORDER — PREGABALIN 25 MG PO CAPS: ORAL_CAPSULE | ORAL | 1 refills | Status: DC

## 2018-08-11 ENCOUNTER — Encounter (INDEPENDENT_AMBULATORY_CARE_PROVIDER_SITE_OTHER): Payer: Medicare Other | Admitting: Ophthalmology

## 2018-08-15 ENCOUNTER — Ambulatory Visit (INDEPENDENT_AMBULATORY_CARE_PROVIDER_SITE_OTHER): Payer: Medicare Other | Admitting: Ophthalmology

## 2018-08-15 ENCOUNTER — Other Ambulatory Visit: Payer: Self-pay

## 2018-08-15 ENCOUNTER — Encounter (INDEPENDENT_AMBULATORY_CARE_PROVIDER_SITE_OTHER): Payer: Self-pay | Admitting: Ophthalmology

## 2018-08-15 DIAGNOSIS — I10 Essential (primary) hypertension: Secondary | ICD-10-CM

## 2018-08-15 DIAGNOSIS — H34811 Central retinal vein occlusion, right eye, with macular edema: Secondary | ICD-10-CM

## 2018-08-15 DIAGNOSIS — H35033 Hypertensive retinopathy, bilateral: Secondary | ICD-10-CM

## 2018-08-15 DIAGNOSIS — D3131 Benign neoplasm of right choroid: Secondary | ICD-10-CM

## 2018-08-15 DIAGNOSIS — Z961 Presence of intraocular lens: Secondary | ICD-10-CM

## 2018-08-15 DIAGNOSIS — H3581 Retinal edema: Secondary | ICD-10-CM

## 2018-08-15 DIAGNOSIS — H353132 Nonexudative age-related macular degeneration, bilateral, intermediate dry stage: Secondary | ICD-10-CM

## 2018-08-15 MED ORDER — BEVACIZUMAB CHEMO INJECTION 1.25MG/0.05ML SYRINGE FOR KALEIDOSCOPE
1.2500 mg | INTRAVITREAL | Status: AC | PRN
  Administered 2018-08-15: 1.25 mg via INTRAVITREAL

## 2018-08-15 NOTE — Progress Notes (Signed)
[facility] Note  08/15/2018     CHIEF COMPLAINT Patient presents for Retina Follow Up   HISTORY OF PRESENT ILLNESS: Vanessa [name] is a 85 y.o. female who presents to the clinic today for:   HPI    Retina Follow Up    Patient presents with  CRVO/BRVO.  In right eye.  Severity is moderate.  Duration of 5.5 weeks.  Since onset it is gradually worsening.  I, the attending physician,  performed the HPI with the patient and updated documentation appropriately.          Comments    Patient states sees "black cloud" in vision OD.        Last edited by Bernarda Caffey, MD on 08/15/2018  1:20 PM. (History)    pt states she can not tell whether her vision is worse than last time, she states she's had so many things going on that she doesn't concentrate on her vision  Referring physician: [name], MD [place],  [place] [id number]  HISTORICAL INFORMATION:   Selected notes from the MEDICAL RECORD NUMBER Referred by Dr. [name] for concern of CME LEE: 01.21.20 Elliot Dally) [BCVA: OD: OS:] Ocular Hx- PMH-    CURRENT MEDICATIONS: No current outpatient medications on file. (Ophthalmic Drugs)   No current facility-administered medications for this visit.  (Ophthalmic Drugs)   Current Outpatient Medications (Other)  Medication Sig  . acetaminophen (TYLENOL) 500 MG tablet Take 500 mg by mouth 2 (two) times daily.   . Artificial Saliva (SALIVAMAX) PACK Use as directed 1 Package in the mouth or throat. mix with 101mL's of H20; swish and spit BID; may keep in bathroom; may self administer.  Marland Kitchen aspirin 81 MG chewable tablet Chew 81 mg by mouth daily.  Marland Kitchen atenolol (TENORMIN) 25 MG tablet Take 12.5 mg by mouth 2 (two) times daily. Hold if SBP < 100 or HR < 60  . Biotin 5000 MCG TABS Take 5,000 mcg by mouth daily.  . famotidine (PEPCID) 20 MG tablet Take 20 mg by mouth daily.  . fluticasone (FLONASE) 50 MCG/ACT nasal spray Place 2 sprays into  both nostrils daily.  . hydrogen peroxide 1.5 % SOLN Apply 1 application topically as needed (Swish and spit for dry mouth).  Marland Kitchen ipratropium (ATROVENT) 0.06 % nasal spray Place 2 sprays into both nostrils 2 (two) times daily as needed for rhinitis.  Marland Kitchen lactose free nutrition (BOOST PLUS) LIQD Take 237 mLs by mouth 2 (two) times daily with a meal. Per request of resident, states can't eat if takes it later   . Lidocaine 4 % PTCH Apply 1 patch topically. Remove patch at bedtime  . lidocaine-prilocaine (EMLA) cream Apply 1 application topically daily as needed.  . loratadine (CLARITIN) 10 MG tablet Take 10 mg by mouth daily as needed for allergies.  . Menthol, Topical Analgesic, (BIOFREEZE ROLL-ON) 4 % GEL Apply 1 application topically every 8 (eight) hours. Apply to LLE  . Multiple Vitamins-Minerals (MULTIVITAL) tablet Take 1 tablet by mouth daily.  . polyethylene glycol (MIRALAX / GLYCOLAX) packet Take 17 g daily as needed by mouth (for constipation).   . pregabalin (LYRICA) 25 MG capsule One capsule in the morning and 2 in the evening  . sennosides-docusate sodium (SENOKOT-S) 8.6-50 MG tablet Take 2 tablets by mouth daily.   . sodium phosphate (FLEET) 7-19 GM/118ML ENEM Place 1 enema rectally as needed for severe constipation. Give Fleet enema per rectum  X 1 after removing hard stool or if soft stool is present in rectum.  . traMADol (ULTRAM) 50 MG tablet Take 1 tablet (50 mg total) by mouth 2 (two) times daily. Also take one by mouth once daily as needed  . triamcinolone cream (KENALOG) 0.1 % Apply 1 application topically as needed. Apply to itchy areas as needed. May keep in room and self apply  . acetaminophen (TYLENOL) 325 MG tablet Take 650 mg by mouth every 8 (eight) hours as needed.   Current Facility-Administered Medications (Other)  Medication Route  . Bevacizumab (AVASTIN) SOLN 1.25 mg Intravitreal  . Bevacizumab (AVASTIN) SOLN 1.25 mg Intravitreal      REVIEW OF SYSTEMS: ROS     Positive for: Gastrointestinal, Neurological, Musculoskeletal, Endocrine, Eyes   Negative for: Constitutional, Skin, Genitourinary, HENT, Cardiovascular, Respiratory, Psychiatric, Allergic/Imm, Heme/Lymph   Last edited by Roselee Nova D on 08/15/2018  1:02 PM. (History)       ALLERGIES No Known Allergies  PAST MEDICAL HISTORY Past Medical History:  Diagnosis Date  . Abdominal bloating   . Atrial fibrillation ([place])   . Bruises easily   . Carotid artery occlusion    LEFT  . Dizziness   . DOE (dyspnea on exertion) 04/02/2014  . Fall at home Sept 2013, Dec. 2013  Jun 08, 2012  . GERD (gastroesophageal reflux disease) 02/11/2015  . Headache(784.0)   . Hoarseness   . Hypercholesterolemia   . Hyperglycemia 04/02/2014   Glucose 178 mg percent on 01/22/2014 04/05/14 Hgb A1c 6.6 Diet controlled.    . Hypothyroidism 04/15/2015  . Neuropathy    PERIPHERAL  . Pruritus   . Scoliosis   . Varicose veins    Past Surgical History:  Procedure Laterality Date  . ABDOMINAL HYSTERECTOMY  1954  . CHOLECYSTECTOMY  1997  . ESOPHAGOGASTRODUODENOSCOPY (EGD) WITH PROPOFOL N/A 11/25/2016   Procedure: ESOPHAGOGASTRODUODENOSCOPY (EGD) WITH PROPOFOL;  Surgeon: [name], MD;  Location: [facility];  Service: Endoscopy;  Laterality: N/A;  . [place]   correct scoliosis    FAMILY HISTORY Family History  Adopted: Yes  Problem Relation Age of Onset  . Diabetes Mother   . Heart attack Mother   . Heart disease Mother        After age 49  . Heart attack Father   . Stroke Father   . Breast cancer Sister   . Heart disease Maternal Grandmother   . Cancer Son        adopted son. esophagus, stomach, liver  . Stomach cancer Neg Hx   . Colon cancer Neg Hx     SOCIAL HISTORY Social History   Tobacco Use  . Smoking status: Never Smoker  . Smokeless tobacco: Never Used  Substance Use Topics  . Alcohol use: No    Alcohol/week: 0.0 standard drinks  . Drug use: No          OPHTHALMIC EXAM:  Base Eye Exam    Visual Acuity (Snellen - Linear)      Right Left   Dist cc CF at 3' 20/25 -2   Dist ph cc 20/800 NI   Correction: Glasses       Tonometry (Tonopen, 1:14 PM)      Right Left   Pressure 15 14       Pupils      Dark Light Shape React APD   Right 3 2.5 Round Minimal +2   Left 3 2 Round Brisk None  Visual Fields (Counting fingers)      Left Right    Full Full       Extraocular Movement      Right Left    Full, Ortho Full, Ortho       Neuro/Psych    Oriented x3: Yes   Mood/Affect: Normal       Dilation    Both eyes: 1.0% Mydriacyl, 2.5% Phenylephrine @ 1:14 PM        Slit Lamp and Fundus Exam    Slit Lamp Exam      Right Left   Lids/Lashes Dermatochalasis - upper lid Dermatochalasis - upper lid, Meibomian gland dysfunction   Conjunctiva/Sclera White and quiet White and quiet   Cornea Arcus, 1-2+ diffuse Punctate epithelial erosions Arcus, 2+ diffuse Punctate epithelial erosions   Anterior Chamber deep and clear deep and clear   Iris Round and moderately dilated to 36mm Round and moderately dilated to 10mm   Lens PC IOL in good position with open PC PC IOL in good position with open PC   Vitreous Vitreous syneresis Vitreous syneresis       Fundus Exam      Right Left   Disc Mild pallor, sharp, superior hyperemia, scattered disc hemes - improved, vascular loops superiorly Pink and Sharp   C/D Ratio 0.4 0.3   Macula Blunted foveal reflex, RPE mottling and clumping, scattered IRH consist with CRVO and CWS - improving, +cystic change Flat, Blunted foveal reflex, Drusen, RPE mottling and clumping, No heme or edema   Vessels Attenuated, tortuous, CRVO Vascular attenuation, Tortuous   Periphery Attached, choroidal nevus at 0930 equator w/ overlying drusen, minimal elavation, no orange pigment, 3DD in diam, 360 DBH - improving Attached, reticular degeneration, peripheral cystoid degeneration        Refraction    Wearing Rx       Sphere Cylinder Axis Add   Right -2.75 +2.75 010 +3.25   Left -3.75 +3.25 011 +3.25          IMAGING AND PROCEDURES  Imaging and Procedures for @TODAY @  OCT, Retina - OU - Both Eyes       Right Eye Quality was good. Central Foveal Thickness: 201. Progression has worsened. Findings include retinal drusen , no SRF, inner retinal atrophy, outer retinal atrophy, intraretinal hyper-reflective material, normal foveal contour, intraretinal fluid (Recurrent cystic changes; interval progression or ORA).   Left Eye Quality was good. Central Foveal Thickness: 223. Progression has been stable. Findings include normal foveal contour, no SRF, no IRF, retinal drusen , outer retinal atrophy (Mild patchy ORA).   Notes *Images captured and stored on drive  Diagnosis / Impression:  OD: CRVO with recurrent cystic changes; interval progression of ORA centrally non-exu ARMD OU   Clinical management:  See below  Abbreviations: NFP - Normal foveal profile. CME - cystoid macular edema. PED - pigment epithelial detachment. IRF - intraretinal fluid. SRF - subretinal fluid. EZ - ellipsoid zone. ERM - epiretinal membrane. ORA - outer retinal atrophy. ORT - outer retinal tubulation. SRHM - subretinal hyper-reflective material        Intravitreal Injection, Pharmacologic Agent - OD - Right Eye       Time Out 08/15/2018. 1:18 PM. Confirmed correct patient, procedure, site, and patient consented.   Anesthesia Topical anesthesia was used. Anesthetic medications included Lidocaine 2%, Proparacaine 0.5%.   Procedure Preparation included 5% betadine to ocular surface, eyelid speculum. A supplied needle was used.   Injection:  1.25 mg Bevacizumab (AVASTIN)  SOLN   NDC: H061816, Lot: [id number] , Expiration date: 10/12/2018   Route: Intravitreal, Site: Right Eye, Waste: 0 mL  Post-op Post injection exam found visual acuity of at least counting fingers. The patient tolerated the procedure well.  There were no complications. The patient received written and verbal post procedure care education.                 ASSESSMENT/PLAN:    ICD-10-CM   1. Central retinal vein occlusion with macular edema of right eye  H34.8110 Intravitreal Injection, Pharmacologic Agent - OD - Right Eye    Bevacizumab (AVASTIN) SOLN 1.25 mg  2. Retinal edema  H35.81 OCT, Retina - OU - Both Eyes  3. Intermediate stage nonexudative age-related macular degeneration of both eyes  H35.3132   4. Essential hypertension  I10   5. Hypertensive retinopathy of both eyes  H35.033   6. Nevus of choroid of right eye  D31.31   7. Pseudophakia of both eyes  Z96.1     1,2. CRVO with CME  - s/p IVA OD #1 (01.22.20), #2 (02.18.20), #3 (04.22.20),#4 (05.20.20), #5 (06.24.20)  - slightly delayed follow up today due to pt being sick  - BCVA OD: 20/800 decreased from 20/250; OS: 20/30  - OCT shows interval recurrence of cystic changes/IRF OD and significant central ORA  - FA 2.19.2020 shows delayed filling time OD, no active leakage -- ?RAO component  - recommend IVA OD #6 today, 08.04.20  - pt wishes to proceed with treatment  - RBA of procedure discussed, questions answered  - informed consent obtained and signed  - see procedure note  - F/U 4-5 weeks -- DFE/OCT/possible injection  3. Age related macular degeneration, non-exudative, intermediate stage OU5  - The incidence, anatomy, and pathology of dry AMD, risk of progression, and the AREDS and AREDS 2 study including smoking risks discussed with patient.  - recommend amsler grid monitoring  - OD with interval progression of central ORA on OCT  - monitor  4,5. Hypertensive retinopathy OU  - discussed importance of tight BP control  - monitor  6. Choroidal Nevus OD  - ~3DD in diameter, round, located at 0930 equator  - +drusen; no orange pigment or SRF  - minimal elevation  - baseline [employer] imaging obtained 2.19.2020  - stable today  - monitor  7.  Pseudophakia OU  - s/p CE/IOL OU  - beautiful surgeries, doing well  - monitor   Ophthalmic Meds Ordered this visit:  Meds ordered this encounter  Medications  . Bevacizumab (AVASTIN) SOLN 1.25 mg       Return for f/u 4-5 weeks, CRVO OD, DFE, OCT.  There are no Patient Instructions on file for this visit.   Explained the diagnoses, plan, and follow up with the patient and they expressed understanding.  Patient expressed understanding of the importance of proper follow up care.   This document serves as a record of services personally performed by 3.3.2020, MD, PhD. It was created on their behalf by Gardiner Sleeper, OA, an ophthalmic assistant. The creation of this record is the provider's dictation and/or activities during the visit.    Electronically signed by: Ernest Mallick, OA  08.04.2020 1:50 PM    21.04.2020, M.D., Ph.D. Diseases & Surgery of the Retina and Vitreous [facility]  I have reviewed the above documentation for accuracy and completeness, and I agree with the above. 3[facility], M.D., Ph.D. 08/15/18 1:50 PM  Abbreviations: M myopia (nearsighted); A astigmatism; H hyperopia (farsighted); P presbyopia; Mrx spectacle prescription;  CTL contact lenses; OD right eye; OS left eye; OU both eyes  XT exotropia; ET esotropia; PEK punctate epithelial keratitis; PEE punctate epithelial erosions; DES dry eye syndrome; MGD meibomian gland dysfunction; ATs artificial tears; PFAT's preservative free artificial tears; [place] nuclear sclerotic cataract; PSC posterior subcapsular cataract; ERM epi-retinal membrane; PVD posterior vitreous detachment; RD retinal detachment; DM diabetes mellitus; DR diabetic retinopathy; NPDR non-proliferative diabetic retinopathy; PDR proliferative diabetic retinopathy; CSME clinically significant macular edema; DME diabetic macular edema; dbh dot blot hemorrhages; CWS cotton wool spot; POAG primary open angle glaucoma;  C/D cup-to-disc ratio; HVF humphrey visual field; GVF goldmann visual field; OCT optical coherence tomography; IOP intraocular pressure; BRVO Branch retinal vein occlusion; CRVO central retinal vein occlusion; CRAO central retinal artery occlusion; BRAO branch retinal artery occlusion; RT retinal tear; SB scleral buckle; PPV pars plana vitrectomy; VH Vitreous hemorrhage; PRP panretinal laser photocoagulation; IVK intravitreal kenalog; VMT vitreomacular traction; MH Macular hole;  NVD neovascularization of the disc; NVE neovascularization elsewhere; AREDS age related eye disease study; ARMD age related macular degeneration; POAG primary open angle glaucoma; EBMD epithelial/anterior basement membrane dystrophy; ACIOL anterior chamber intraocular lens; IOL intraocular lens; PCIOL posterior chamber intraocular lens; Phaco/IOL phacoemulsification with intraocular lens placement; [place] photorefractive keratectomy; LASIK laser assisted in situ keratomileusis; HTN hypertension; DM diabetes mellitus; COPD chronic obstructive pulmonary disease

## 2018-09-12 ENCOUNTER — Other Ambulatory Visit: Payer: Self-pay | Admitting: *Deleted

## 2018-09-12 MED ORDER — TRAMADOL HCL 50 MG PO TABS: 50.0000 mg | ORAL_TABLET | Freq: Two times a day (BID) | ORAL | 0 refills | Status: DC

## 2018-09-18 NOTE — Progress Notes (Signed)
[facility] Note  09/19/2018     CHIEF COMPLAINT Patient presents for Retina Follow Up   HISTORY OF PRESENT ILLNESS: Vanessa [name] is a 85 y.o. female who presents to the clinic today for:   HPI    Retina Follow Up    Patient presents with  CRVO/BRVO.  In right eye.  This started weeks ago.  Severity is moderate.  Duration of weeks.  Since onset it is stable.  I, the attending physician,  performed the HPI with the patient and updated documentation appropriately.          Comments    Patient states her vision is about the same.  She denies eye pain or discomfort and denies any new or worsening floaters or fol OU.       Last edited by Bernarda Caffey, MD on 09/19/2018 11:38 PM. (History)    Pt reports plans to start taking saffron supplements to help ARMD.  Referring physician: [name], MD [place],  [place] [id number]  HISTORICAL INFORMATION:   Selected notes from the MEDICAL RECORD NUMBER Referred by Dr. [name] for concern of CME LEE: 01.21.20 (R. Groat) [BCVA: OD: OS:] Ocular Hx- PMH-    CURRENT MEDICATIONS: No current outpatient medications on file. (Ophthalmic Drugs)   No current facility-administered medications for this visit.  (Ophthalmic Drugs)   Current Outpatient Medications (Other)  Medication Sig  . acetaminophen (TYLENOL) 325 MG tablet Take 650 mg by mouth every 8 (eight) hours as needed.  Marland Kitchen acetaminophen (TYLENOL) 500 MG tablet Take 500 mg by mouth 2 (two) times daily.   . Artificial Saliva (SALIVAMAX) PACK Use as directed 1 Package in the mouth or throat. mix with 83mL's of H20; swish and spit BID; may keep in bathroom; may self administer.  Marland Kitchen aspirin 81 MG chewable tablet Chew 81 mg by mouth daily.  Marland Kitchen atenolol (TENORMIN) 25 MG tablet Take 12.5 mg by mouth 2 (two) times daily. Hold if SBP < 100 or HR < 60  . Biotin 5000 MCG TABS Take 5,000 mcg by mouth daily.  . famotidine (PEPCID) 20 MG tablet  Take 20 mg by mouth daily.  . fluticasone (FLONASE) 50 MCG/ACT nasal spray Place 2 sprays into both nostrils daily.  . hydrogen peroxide 1.5 % SOLN Apply 1 application topically as needed (Swish and spit for dry mouth).  Marland Kitchen ipratropium (ATROVENT) 0.06 % nasal spray Place 2 sprays into both nostrils 2 (two) times daily as needed for rhinitis.  Marland Kitchen lactose free nutrition (BOOST PLUS) LIQD Take 237 mLs by mouth 2 (two) times daily with a meal. Per request of resident, states can't eat if takes it later   . Lidocaine 4 % PTCH Apply 1 patch topically. Remove patch at bedtime  . lidocaine-prilocaine (EMLA) cream Apply 1 application topically daily as needed.  . loratadine (CLARITIN) 10 MG tablet Take 10 mg by mouth daily as needed for allergies.  . Menthol, Topical Analgesic, (BIOFREEZE ROLL-ON) 4 % GEL Apply 1 application topically every 8 (eight) hours. Apply to LLE  . Multiple Vitamins-Minerals (MULTIVITAL) tablet Take 1 tablet by mouth daily.  . polyethylene glycol (MIRALAX / GLYCOLAX) packet Take 17 g daily as needed by mouth (for constipation).   . pregabalin (LYRICA) 25 MG capsule One capsule in the morning and 2 in the evening  . sennosides-docusate sodium (SENOKOT-S) 8.6-50 MG tablet Take 2 tablets by mouth daily.   . sodium phosphate (FLEET)  7-19 GM/118ML ENEM Place 1 enema rectally as needed for severe constipation. Give Fleet enema per rectum X 1 after removing hard stool or if soft stool is present in rectum.  . traMADol (ULTRAM) 50 MG tablet Take 1 tablet (50 mg total) by mouth 2 (two) times daily. Also take one by mouth once daily as needed  . triamcinolone cream (KENALOG) 0.1 % Apply 1 application topically as needed. Apply to itchy areas as needed. May keep in room and self apply   Current Facility-Administered Medications (Other)  Medication Route  . Bevacizumab (AVASTIN) SOLN 1.25 mg Intravitreal  . Bevacizumab (AVASTIN) SOLN 1.25 mg Intravitreal      REVIEW OF SYSTEMS: ROS     Positive for: Gastrointestinal, Neurological, Musculoskeletal, Endocrine, Eyes   Negative for: Constitutional, Skin, Genitourinary, HENT, Cardiovascular, Respiratory, Psychiatric, Allergic/Imm, Heme/Lymph   Last edited by Doneen Poisson on 09/19/2018  2:21 PM. (History)       ALLERGIES No Known Allergies  PAST MEDICAL HISTORY Past Medical History:  Diagnosis Date  . Abdominal bloating   . Atrial fibrillation ([place])   . Bruises easily   . Carotid artery occlusion    LEFT  . Dizziness   . DOE (dyspnea on exertion) 04/02/2014  . Fall at home Sept 2013, Dec. 2013  Jun 08, 2012  . GERD (gastroesophageal reflux disease) 02/11/2015  . Headache(784.0)   . Hoarseness   . Hypercholesterolemia   . Hyperglycemia 04/02/2014   Glucose 178 mg percent on 01/22/2014 04/05/14 Hgb A1c 6.6 Diet controlled.    . Hypothyroidism 04/15/2015  . Neuropathy    PERIPHERAL  . Pruritus   . Scoliosis   . Varicose veins    Past Surgical History:  Procedure Laterality Date  . ABDOMINAL HYSTERECTOMY  1954  . CHOLECYSTECTOMY  1997  . ESOPHAGOGASTRODUODENOSCOPY (EGD) WITH PROPOFOL N/A 11/25/2016   Procedure: ESOPHAGOGASTRODUODENOSCOPY (EGD) WITH PROPOFOL;  Surgeon: [name], MD;  Location: [facility];  Service: Endoscopy;  Laterality: N/A;  . [place]   correct scoliosis    FAMILY HISTORY Family History  Adopted: Yes  Problem Relation Age of Onset  . Diabetes Mother   . Heart attack Mother   . Heart disease Mother        After age 55  . Heart attack Father   . Stroke Father   . Breast cancer Sister   . Heart disease Maternal Grandmother   . Cancer Son        adopted son. esophagus, stomach, liver  . Stomach cancer Neg Hx   . Colon cancer Neg Hx     SOCIAL HISTORY Social History   Tobacco Use  . Smoking status: Never Smoker  . Smokeless tobacco: Never Used  Substance Use Topics  . Alcohol use: No    Alcohol/week: 0.0 standard drinks  . Drug use: No          OPHTHALMIC EXAM:  Base Eye Exam    Visual Acuity (Snellen - Linear)      Right Left   Dist cc CF @ 3' 20/25 -1   Dist ph cc NI NI   Correction: Glasses       Tonometry (Tonopen, 2:23 PM)      Right Left   Pressure 16 15       Pupils      Dark Light Shape React APD   Right 2 1 Round Minimal 0   Left 2 1 Round Minimal 0  Visual Fields      Left Right    Full Full       Extraocular Movement      Right Left    Full Full       Neuro/Psych    Oriented x3: Yes   Mood/Affect: Normal       Dilation    Both eyes: 1.0% Mydriacyl, 2.5% Phenylephrine @ 2:23 PM        Slit Lamp and Fundus Exam    Slit Lamp Exam      Right Left   Lids/Lashes Dermatochalasis - upper lid Dermatochalasis - upper lid, Meibomian gland dysfunction   Conjunctiva/Sclera White and quiet White and quiet   Cornea Arcus, 1-2+ diffuse Punctate epithelial erosions Arcus, 2+ diffuse Punctate epithelial erosions   Anterior Chamber deep and clear deep and clear   Iris Round and moderately dilated to 77mm Round and moderately dilated to 35mm   Lens PC IOL in good position with open PC PC IOL in good position with open PC   Vitreous Vitreous syneresis Vitreous syneresis       Fundus Exam      Right Left   Disc Mild pallor, sharp, superior hyperemia, scattered disc hemes - improved, vascular loops superiorly Pink and Sharp   C/D Ratio 0.4 0.3   Macula Blunted foveal reflex, RPE mottling and clumping, scattered IRH consist with CRVO and CWS - improving, +trace cystic change Flat, Blunted foveal reflex, Drusen, RPE mottling and clumping, No heme or edema   Vessels Attenuated, tortuous, CRVO Vascular attenuation, Tortuous   Periphery Attached, choroidal nevus at 0930 equator w/ overlying drusen, minimal elavation, no orange pigment, 3DD in diam, 360 DBH - improving Attached, reticular degeneration, peripheral cystoid degeneration        Refraction    Wearing Rx      Sphere Cylinder Axis Add   Right  -2.75 +2.75 010 +3.25   Left -3.75 +3.25 011 +3.25          IMAGING AND PROCEDURES  Imaging and Procedures for @TODAY @  OCT, Retina - OU - Both Eyes       Right Eye Quality was good. Central Foveal Thickness: 197. Progression has been stable. Findings include retinal drusen , no SRF, inner retinal atrophy, outer retinal atrophy, intraretinal hyper-reflective material, normal foveal contour, intraretinal fluid (Mild persistent cystic changes; persistent ORA).   Left Eye Quality was good. Central Foveal Thickness: 223. Progression has been stable. Findings include normal foveal contour, no SRF, no IRF, retinal drusen , outer retinal atrophy (Mild patchy ORA).   Notes *Images captured and stored on drive  Diagnosis / Impression:  OD: CRVO with mild persistent cystic changes; persistent ORA non-exu ARMD OU   Clinical management:  See below  Abbreviations: NFP - Normal foveal profile. CME - cystoid macular edema. PED - pigment epithelial detachment. IRF - intraretinal fluid. SRF - subretinal fluid. EZ - ellipsoid zone. ERM - epiretinal membrane. ORA - outer retinal atrophy. ORT - outer retinal tubulation. SRHM - subretinal hyper-reflective material        Intravitreal Injection, Pharmacologic Agent - OD - Right Eye       Time Out 09/19/2018. 3:01 PM. Confirmed correct patient, procedure, site, and patient consented.   Anesthesia Topical anesthesia was used. Anesthetic medications included Lidocaine 2%, Proparacaine 0.5%.   Procedure Preparation included 5% betadine to ocular surface, eyelid speculum. A 30 gauge needle was used.   Injection:  1.25 mg Bevacizumab (AVASTIN) SOLN   NDC:  [id number], Lot: [id number] , Expiration date: 11/25/2018   Route: Intravitreal, Site: Right Eye, Waste: 0 mL  Post-op Post injection exam found visual acuity of at least counting fingers. The patient tolerated the procedure well. There were no complications. The patient received  written and verbal post procedure care education.                 ASSESSMENT/PLAN:    ICD-10-CM   1. Central retinal vein occlusion with macular edema of right eye  H34.8110 Intravitreal Injection, Pharmacologic Agent - OD - Right Eye    Bevacizumab (AVASTIN) SOLN 1.25 mg  2. Retinal edema  H35.81 OCT, Retina - OU - Both Eyes  3. Intermediate stage nonexudative age-related macular degeneration of both eyes  H35.3132   4. Essential hypertension  I10   5. Hypertensive retinopathy of both eyes  H35.033   6. Nevus of choroid of right eye  D31.31   7. Pseudophakia of both eyes  Z96.1     1,2. CRVO with CME OD  - s/p IVA OD #1 (01.22.20), #2 (02.18.20), #3 (04.22.20),#4 (05.20.20), #5 (06.24.20)  - slightly delayed follow up today due to pt being sick  - BCVA OD: stable at CF; OS: improved to 20/25 from 20/30  - OCT shows mild persistent cystic changes, no frank CME; persistent ORA  - FA 2.19.2020 shows delayed filling time OD, no active leakage -- ?RAO component  - recommend IVA OD #6 today, 08.04.20 -- maintenance  - pt wishes to proceed with treatment  - RBA of procedure discussed, questions answered  - informed consent obtained and signed  - see procedure note  - F/U 4-5 weeks -- DFE/OCT/possible injection  3. Age related macular degeneration, non-exudative, intermediate stage OU5  - The incidence, anatomy, and pathology of dry AMD, risk of progression, and the AREDS and AREDS 2 study including smoking risks discussed with patient.  - recommend amsler grid monitoring  - OD with interval progression of central ORA on OCT  - monitor  4,5. Hypertensive retinopathy OU  - discussed importance of tight BP control  - monitor  6. Choroidal Nevus OD  - ~3DD in diameter, round, located at 0930 equator  - +drusen; no orange pigment or SRF  - minimal elevation  - baseline Opt[employer] imaging obtained 2.19.2020  - stable today  - monitor  7. Pseudophakia OU  - s/p CE/IOL OU  -  beautiful surgeries, doing well  - monitor   Ophthalmic Meds Ordered this visit:  Meds ordered this encounter  Medications  . Bevacizumab (AVASTIN) SOLN 1.25 mg       Return for f/u 4-5 weeks, CRVO OD, DFE, OCT.  There are no Patient Instructions on file for this visit.   Explained the diagnoses, plan, and follow up with the patient and they expressed understanding.  Patient expressed understanding of the importance of proper follow up care.   This document serves as a record of services personally performed by 3.3.2020, MD, PhD. It was created on their behalf by Gardiner Sleeper, OA, an ophthalmic assistant. The creation of this record is the provider's dictation and/or activities during the visit.    Electronically signed by: Ernest Mallick, OA  09.07.2020 11:47 PM    22.07.2020, M.D., Ph.D. Diseases & Surgery of the Retina and Vitreous [facility]  I have reviewed the above documentation for accuracy and completeness, and I agree with the above. 3[facility], M.D., Ph.D. 09/19/18 11:47 PM  Abbreviations: M myopia (nearsighted); A astigmatism; H hyperopia (farsighted); P presbyopia; Mrx spectacle prescription;  CTL contact lenses; OD right eye; OS left eye; OU both eyes  XT exotropia; ET esotropia; PEK punctate epithelial keratitis; PEE punctate epithelial erosions; DES dry eye syndrome; MGD meibomian gland dysfunction; ATs artificial tears; PFAT's preservative free artificial tears; [place] nuclear sclerotic cataract; PSC posterior subcapsular cataract; ERM epi-retinal membrane; PVD posterior vitreous detachment; RD retinal detachment; DM diabetes mellitus; DR diabetic retinopathy; NPDR non-proliferative diabetic retinopathy; PDR proliferative diabetic retinopathy; CSME clinically significant macular edema; DME diabetic macular edema; dbh dot blot hemorrhages; CWS cotton wool spot; POAG primary open angle glaucoma; C/D cup-to-disc ratio; HVF humphrey  visual field; GVF goldmann visual field; OCT optical coherence tomography; IOP intraocular pressure; BRVO Branch retinal vein occlusion; CRVO central retinal vein occlusion; CRAO central retinal artery occlusion; BRAO branch retinal artery occlusion; RT retinal tear; SB scleral buckle; PPV pars plana vitrectomy; VH Vitreous hemorrhage; PRP panretinal laser photocoagulation; IVK intravitreal kenalog; VMT vitreomacular traction; MH Macular hole;  NVD neovascularization of the disc; NVE neovascularization elsewhere; AREDS age related eye disease study; ARMD age related macular degeneration; POAG primary open angle glaucoma; EBMD epithelial/anterior basement membrane dystrophy; ACIOL anterior chamber intraocular lens; IOL intraocular lens; PCIOL posterior chamber intraocular lens; Phaco/IOL phacoemulsification with intraocular lens placement; [place] photorefractive keratectomy; LASIK laser assisted in situ keratomileusis; HTN hypertension; DM diabetes mellitus; COPD chronic obstructive pulmonary disease

## 2018-09-19 ENCOUNTER — Ambulatory Visit: Payer: Medicare Other | Admitting: Physician Assistant

## 2018-09-19 ENCOUNTER — Ambulatory Visit (INDEPENDENT_AMBULATORY_CARE_PROVIDER_SITE_OTHER): Payer: Medicare Other | Admitting: Ophthalmology

## 2018-09-19 ENCOUNTER — Encounter (INDEPENDENT_AMBULATORY_CARE_PROVIDER_SITE_OTHER): Payer: Self-pay | Admitting: Ophthalmology

## 2018-09-19 ENCOUNTER — Other Ambulatory Visit: Payer: Self-pay

## 2018-09-19 DIAGNOSIS — H34811 Central retinal vein occlusion, right eye, with macular edema: Secondary | ICD-10-CM

## 2018-09-19 DIAGNOSIS — H3581 Retinal edema: Secondary | ICD-10-CM | POA: Diagnosis not present

## 2018-09-19 DIAGNOSIS — I10 Essential (primary) hypertension: Secondary | ICD-10-CM | POA: Diagnosis not present

## 2018-09-19 DIAGNOSIS — D3131 Benign neoplasm of right choroid: Secondary | ICD-10-CM

## 2018-09-19 DIAGNOSIS — Z961 Presence of intraocular lens: Secondary | ICD-10-CM

## 2018-09-19 DIAGNOSIS — H353132 Nonexudative age-related macular degeneration, bilateral, intermediate dry stage: Secondary | ICD-10-CM | POA: Diagnosis not present

## 2018-09-19 DIAGNOSIS — H35033 Hypertensive retinopathy, bilateral: Secondary | ICD-10-CM

## 2018-09-19 MED ORDER — BEVACIZUMAB CHEMO INJECTION 1.25MG/0.05ML SYRINGE FOR KALEIDOSCOPE
1.2500 mg | INTRAVITREAL | Status: AC | PRN
  Administered 2018-09-19: 1.25 mg via INTRAVITREAL

## 2018-09-21 ENCOUNTER — Encounter: Payer: Self-pay | Admitting: Physician Assistant

## 2018-09-21 ENCOUNTER — Ambulatory Visit (INDEPENDENT_AMBULATORY_CARE_PROVIDER_SITE_OTHER): Payer: Medicare Other | Admitting: Physician Assistant

## 2018-09-21 ENCOUNTER — Other Ambulatory Visit: Payer: Self-pay

## 2018-09-21 VITALS — BP 104/62 | HR 62 | Temp 97.4°F | Ht 63.0 in | Wt 104.0 lb

## 2018-09-21 DIAGNOSIS — R198 Other specified symptoms and signs involving the digestive system and abdomen: Secondary | ICD-10-CM

## 2018-09-21 DIAGNOSIS — K5900 Constipation, unspecified: Secondary | ICD-10-CM

## 2018-09-21 NOTE — Patient Instructions (Signed)
We have given you samples of the following medication to take: IBgard 2 tablets daily, twice a day.  We would like for you to start taking Mirilax a 1/2 dose daily.  Both of these medications are available over the counter at your pharmacy.  Thank you for choosing me and [facility]  Ellouise Newer, PA-C

## 2018-09-21 NOTE — Progress Notes (Signed)
Chief Complaint: "Rumbling stomach"  Review of pertinent gastrointestinal problems: 1. Dyspepsia, bloating, chronic:  Many years patient of Dr. Cristina Gong established care [facility] 2017; Symptoms possibly functional. I offered several times but she declined, higher risk given advanced age, frailty. Conservative measures: Gas ex. PPI. FD Guard samples helped but she has trouble affording them.  H. Pylori stool Ag 01/2016 negative; CBC, CMET normal 2017.   2. 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 This impeded passage of barium tablet for 5 minutes.Mild gastroesophageal reflux. 12/2011 Speech, language notes:Dysphagia Diagnosis: Moderate cervical esophageal phase dysphagia Clinical impression: Patient exhibits a normal orophayrngeal swallow, but a moderated cervical esophageal dysphagia, with a prominent cricopharyngeus, and a small Zenkker's diverticulm below the UES.  The pill lodged sideways near the level of the Zenker's, causing discomfort and anxiety to the patient.  Patient reports this occurs with meats and occassionally pills at home.  After 5 minutes and multiple swallows of puree, water, and nectar, the pill finally dissolved enough to pass.  Previously documented esophageal motility disorder persists.  GI f/u with Dr. Cristina Gong may be beneficial.   Dr. Alvester Chou, Radiologist, observed view of pill that was lodged, and observed patient take 3 swallows of barium after pill passed.  He agrees with above. In 2016 she was referred to [facility], she was offered a dilation procedure.  She declined. 11/2016 Barium esophagram: 1. Persistent abnormal relaxation of the cricopharyngeus withhigh-grade narrowing at the thoracic inlet. No associated diverticulum, aspiration, or stasis. 2. Otherwise negative study 11/2016 EGD Dr. Ardis Hughs: unable to intubate the esophagus due to small  Zenkers, tight cricopharyngous.  HPI:    Vanessa [name] is a 85 year old Caucasian female with a past medical history as listed below, known to Dr. Ardis Hughs, who was referred to me by [name], MD for a complaint of "rumbling stomach".      01/31/2015 patient seen for borborygmi, dyspepsia and bloating.  At that time discussed that she had seen Dr. Ardis Hughs regarding these complaints for the last 6 months on 3 separate occasions.  She has chronic dyspepsia and chronic intermittent dysphasia related to tight cricopharyngeus and Zenker's diverticulum.  When seen in clinic last patient was fairly frustrated that she continued to have all of her symptoms.  She is using omeprazole 20 mg daily and Gas-X after eating meals which sometimes helped.  Of note family history significant for 1 of her twin sons dying from stomach cancer 4 years ago.   At that time discussed an EGD at great length.  At that time she realized she was high risk for anesthesia at her advanced age and wanted more time to think about it.  Her omeprazole was increased to 20 mg twice a day and she was told to take her Gas-X 3 times a day before eating.  She is also started on FD guard 2 tablets twice a day.    11/29/2016 seen by Dr. Ardis Hughs for dysphagia.  She had an EGD the week before.  Dr. Ardis Hughs is unable to intubate her esophagus due to a tight cricopharyngeus and proximal Zenker's diverticulum.  Apparently she been offered dilation by her ENT in [place] but declined.  At that time it was recommended that she eat foods which do not require any chewing such as full liquid/pured diet.  She was supposed to call us if she wanted to reconsider referral back to  ENT at [place], the patient presents to clinic alone, she does not remember her previous visits for this "rumbling" in her stomach.  Tells me that she hears it from her left lower side and it can happen throughout the day, typically after eating, sometimes very loud which is  disturbing to her.    Also complains of some constipation, apparently most recently was constipated for 3 days and then drink some warm prune juice yesterday which alleviated her constipation this morning.    Denies fever, chills, blood in her stool or symptoms that awaken her from sleep.  Past Medical History:  Diagnosis Date   Abdominal bloating    Atrial fibrillation (HCC)    Bruises easily    Carotid artery occlusion    LEFT   Dizziness    DOE (dyspnea on exertion) 04/02/2014   Fall at home Sept 2013, Dec. 2013  Jun 08, 2012   GERD (gastroesophageal reflux disease) 02/11/2015   Headache(784.0)    Hoarseness    Hypercholesterolemia    Hyperglycemia 04/02/2014   Glucose 178 mg percent on 01/22/2014 04/05/14 Hgb A1c 6.6 Diet controlled.     Hypothyroidism 04/15/2015   Neuropathy    PERIPHERAL   Pruritus    Scoliosis    Varicose veins     Past Surgical History:  Procedure Laterality Date   [place]   ESOPHAGOGASTRODUODENOSCOPY (EGD) WITH PROPOFOL N/A 11/25/2016   Procedure: ESOPHAGOGASTRODUODENOSCOPY (EGD) WITH PROPOFOL;  Surgeon: [name], MD;  Location: [facility];  Service: Endoscopy;  Laterality: N/A;   [place]   correct scoliosis    Current Outpatient Medications  Medication Sig Dispense Refill   acetaminophen (TYLENOL) 325 MG tablet Take 650 mg by mouth every 8 (eight) hours as needed.     acetaminophen (TYLENOL) 500 MG tablet Take 500 mg by mouth 2 (two) times daily.      Artificial Saliva (SALIVAMAX) PACK Use as directed 1 Package in the mouth or throat. mix with 55mL's of H20; swish and spit BID; may keep in bathroom; may self administer.     aspirin 81 MG chewable tablet Chew 81 mg by mouth daily.     atenolol (TENORMIN) 25 MG tablet Take 12.5 mg by mouth 2 (two) times daily. Hold if SBP < 100 or HR < 60     Biotin 5000 MCG TABS Take 5,000 mcg by mouth daily.     famotidine  (PEPCID) 20 MG tablet Take 20 mg by mouth daily.     fluticasone (FLONASE) 50 MCG/ACT nasal spray Place 2 sprays into both nostrils daily.     hydrogen peroxide 1.5 % SOLN Apply 1 application topically as needed (Swish and spit for dry mouth).     ipratropium (ATROVENT) 0.06 % nasal spray Place 2 sprays into both nostrils 2 (two) times daily as needed for rhinitis.     lactose free nutrition (BOOST PLUS) LIQD Take 237 mLs by mouth 2 (two) times daily with a meal. Per request of resident, states can't eat if takes it later      Lidocaine 4 % PTCH Apply 1 patch topically. Remove patch at bedtime     lidocaine-prilocaine (EMLA) cream Apply 1 application topically daily as needed.     loratadine (CLARITIN) 10 MG tablet Take 10 mg by mouth daily as needed for allergies.     Menthol, Topical Analgesic, (BIOFREEZE ROLL-ON) 4 % GEL Apply 1 application topically  every 8 (eight) hours. Apply to LLE     Multiple Vitamins-Minerals (MULTIVITAL) tablet Take 1 tablet by mouth daily.     polyethylene glycol (MIRALAX / GLYCOLAX) packet Take 17 g daily as needed by mouth (for constipation).      pregabalin (LYRICA) 25 MG capsule One capsule in the morning and 2 in the evening 270 capsule 1   sennosides-docusate sodium (SENOKOT-S) 8.6-50 MG tablet Take 2 tablets by mouth daily.      sodium phosphate (FLEET) 7-19 GM/118ML ENEM Place 1 enema rectally as needed for severe constipation. Give Fleet enema per rectum X 1 after removing hard stool or if soft stool is present in rectum.     traMADol (ULTRAM) 50 MG tablet Take 1 tablet (50 mg total) by mouth 2 (two) times daily. Also take one by mouth once daily as needed 90 tablet 0   triamcinolone cream (KENALOG) 0.1 % Apply 1 application topically as needed. Apply to itchy areas as needed. May keep in room and self apply     Current Facility-Administered Medications  Medication Dose Route Frequency Provider Last Rate Last Dose   Bevacizumab (AVASTIN) SOLN  1.25 mg  1.25 mg Intravitreal  Bernarda Caffey, MD   1.25 mg at 02/01/18 2203   Bevacizumab (AVASTIN) SOLN 1.25 mg  1.25 mg Intravitreal  Bernarda Caffey, MD   1.25 mg at 03/01/18 1717    Allergies as of 09/21/2018   (No Known Allergies)    Family History  Adopted: Yes  Problem Relation Age of Onset   Diabetes Mother    Heart attack Mother    Heart disease Mother        After age 16   Heart attack Father    Stroke Father    Breast cancer Sister    Heart disease Maternal Grandmother    Cancer Son        adopted son. 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  Social Needs   Financial resource strain: Not hard at all   Food insecurity    Worry: Never true    Inability: Never true   Transportation needs    Medical: No    Non-medical: No  Tobacco Use   Smoking status: Never Smoker   Smokeless tobacco: Never Used  Substance and Sexual Activity   Alcohol use: No    Alcohol/week: 0.0 standard drinks   Drug use: No   Sexual activity: Never    Birth control/protection: None  Lifestyle   Physical activity    Days per week: 0 days    Minutes per session: 0 min   Stress: Only a little  Relationships   Social connections    Talks on phone: More than three times a week    Gets together: More than three times a week    Attends religious service: Never    Active member of club or organization: No    Attends meetings of clubs or organizations: Never    Relationship status: Widowed   Intimate partner violence    Fear of current or ex partner: No    Emotionally abused:  No    Physically abused: No    Forced sexual activity: No    Other Topics Concern   Not on file  Social History Narrative   Patient lives at [facility] 03/05/2014   Patient is a widowed.    Patient is retired.    Patient has no children but adopted twin sons.    Patient has a college degree.    Never smoked   Alcohol none   Exercise with therapy   Walks with cane   POA       Patient only drinks de-caf drinks.   Patient is right handed.    Review of Systems:    Constitutional: No weight loss, fever or chills Cardiovascular: No chest pain Respiratory: No SOB  Gastrointestinal: See HPI and otherwise negative   Physical Exam:  Vital signs: BP 104/62 (BP Location: Left Arm, Patient Position: Sitting, Cuff Size: Normal)    Pulse 62    Temp (!) 97.4 F (36.3 C) (Other (Comment))    Ht 5\' 3"  (1.6 m)    Wt 104 lb (47.2 kg)    BMI 18.42 kg/m   Constitutional:   Pleasant Elderly, frail appearing, Caucasian female appears to be in NAD, Well developed, Well nourished, alert and cooperative Respiratory: Respirations even and unlabored. Lungs clear to auscultation bilaterally.   No wheezes, crackles, or rhonchi.  Cardiovascular: Normal S1, S2. No MRG. Regular rate and rhythm. No peripheral edema, cyanosis or pallor.  Gastrointestinal:  Soft, nondistended, nontender. No rebound or guarding. Normal bowel sounds. No appreciable masses or hepatomegaly. Rectal:  Not performed.  Psychiatric: Demonstrates good judgement and reason without abnormal affect or behaviors.  MOST RECENT LABS AND IMAGING: CBC    Component Value Date/Time   WBC 8.1 03/30/2018 1252   RBC 3.91 03/30/2018 1252   HGB 13.2 04/06/2018   HGB 11.9 03/07/2017   HCT 39 04/06/2018   HCT 34 03/07/2017   PLT 200 04/06/2018   MCV 102.6 (H) 03/30/2018 1252   MCV 101 (A) 07/27/2011 1149   MCH 33.8 03/30/2018 1252   MCHC 32.9 03/30/2018 1252   RDW 13.5 03/30/2018 1252   LYMPHSABS 1.6 12/02/2016 0511   MONOABS 1.5 (H) 12/02/2016 0511   EOSABS 0.0 12/02/2016 0511   BASOSABS 0.0  12/02/2016 0511    CMP     Component Value Date/Time   NA 138 04/06/2018   NA 140 03/07/2017   K 4.6 04/06/2018   K 4.9 03/07/2017   CL 100 03/30/2018 1252   CL 103 08/04/2017   CO2 26 03/30/2018 1252   CO2 29 08/04/2017   GLUCOSE 109 (H) 03/30/2018 1252   BUN 17 04/06/2018   CREATININE 0.9 04/06/2018   CREATININE 0.93 03/30/2018 1252   CREATININE 0.77 03/07/2017   CALCIUM 9.7 03/30/2018 1252   CALCIUM 9.3 08/04/2017   PROT 6.2 (L) 03/30/2018 1252   PROT 5.9 08/04/2017   ALBUMIN 3.6 03/30/2018 1252   ALBUMIN 3.3 03/07/2017   AST 30 03/30/2018 1252   AST 22 03/07/2017   ALT 16 03/30/2018 1252   ALT 13 03/07/2017   ALKPHOS 77 03/30/2018 1252   ALKPHOS 71 03/07/2017   BILITOT 0.6 03/30/2018 1252   BILITOT 0.6 03/07/2017   GFRNONAA 52 (L) 03/30/2018 1252   GFRNONAA 66 03/07/2017   GFRAA >60 03/30/2018 1252    Assessment: 1.  Borborygmi: Chronic for the patient, likely part of her normal anatomy 2.  Constipation: Off-and-on per patient, likely due to advanced age  Plan: 1.  Recommend the patient use half a dose of MiraLAX on a daily basis to try and alleviate days of severe constipation.  We can increase this if it is not working for her.  Would recommend she try this dose for at least 2 weeks. 2.  Discussed patient's history of borborygmi in the past, has been fully evaluated.  Reviewed previous results with her today.  Discussed that this is likely just part of her natural anatomy at this point.  Recommend she try IBgard 2 tabs twice a day for a month.  Did provide her with samples. 3.  Patient to follow in clinic with me or Dr. Ardis Hughs as needed in the future.  Ellouise Newer, PA-C [facility] 09/21/2018, 3:06 PM  Cc: [name], MD

## 2018-09-22 NOTE — Progress Notes (Signed)
I agree with the above note, plan 

## 2018-09-26 ENCOUNTER — Encounter: Payer: Self-pay | Admitting: Internal Medicine

## 2018-09-26 NOTE — Progress Notes (Signed)
Location:  [place] Room Number: 1 Place of Service:  ALF [phone]) Provider:  Veleta Miners   MD [name], MD  Patient Care Team: [name], MD as PCP - General (Internal Medicine) Angelia Mould, MD as Attending Physician (Vascular Surgery) [name], MD (Inactive) as Attending Physician (Obstetrics and Gynecology) [name], MD (Orthopedic Surgery) [name], MD (Ophthalmology) [name], MD as Consulting Physician (Urology) [name], MD as Consulting Physician (Cardiology) [name], PA-C as Physician Assistant (Otolaryngology) [name], MD as Consulting Physician (Otolaryngology) [name], MD as Consulting Physician (Dermatology) [name], MD as Consulting Physician (Physical Medicine and Rehabilitation) [name], MD as Attending Physician (Gastroenterology) Mast, Man X, NP as Nurse Practitioner (Internal Medicine) Kathrynn Ducking, MD as Consulting Physician (Neurology) Debara Pickett Nadean Corwin, MD as Consulting Physician (Cardiology) Druscilla Brownie, MD as Consulting Physician (Dermatology) [name], [name], NP as Nurse Practitioner (Family Medicine)  Extended Emergency Contact Information Primary Emergency Contact: [name] of [place] Phone: [phone] Mobile Phone: [phone] Relation: Son Secondary Emergency Contact: [name] Mobile Phone: [phone] Relation: Friend  Code Status:  Full Code Goals of care: Advanced Directive information Advanced Directives 09/26/2018  Does Patient Have a Medical Advance Directive? No  Type of Advance Directive -  Does patient want to make changes to medical advance directive? No - Patient declined  Copy of [place] in Chart? -  Would patient like information on creating a medical advance directive? No - Patient declined     Chief Complaint  Patient presents with  . Medical Management of  Chronic Issues    HPI:  Pt is a 85 y.o. female seen today for medical management of chronic diseases.     Past Medical History:  Diagnosis Date  . Abdominal bloating   . Atrial fibrillation ([place])   . Bruises easily   . Carotid artery occlusion    LEFT  . Dizziness   . DOE (dyspnea on exertion) 04/02/2014  . Fall at home Sept 2013, Dec. 2013  Jun 08, 2012  . GERD (gastroesophageal reflux disease) 02/11/2015  . Headache(784.0)   . Hoarseness   . Hypercholesterolemia   . Hyperglycemia 04/02/2014   Glucose 178 mg percent on 01/22/2014 04/05/14 Hgb A1c 6.6 Diet controlled.    . Hypothyroidism 04/15/2015  . Neuropathy    PERIPHERAL  . Pruritus   . Scoliosis   . Varicose veins    Past Surgical History:  Procedure Laterality Date  . ABDOMINAL HYSTERECTOMY  1954  . CHOLECYSTECTOMY  1997  . ESOPHAGOGASTRODUODENOSCOPY (EGD) WITH PROPOFOL N/A 11/25/2016   Procedure: ESOPHAGOGASTRODUODENOSCOPY (EGD) WITH PROPOFOL;  Surgeon: [name], MD;  Location: [facility];  Service: Endoscopy;  Laterality: N/A;  . SPINE SURGERY  1997   correct scoliosis    No Known Allergies  Outpatient Encounter Medications as of 09/26/2018  Medication Sig  . acetaminophen (TYLENOL) 325 MG tablet Take 650 mg by mouth every 8 (eight) hours as needed.  Marland Kitchen acetaminophen (TYLENOL) 500 MG tablet Take 500 mg by mouth 2 (two) times daily.   . Artificial Saliva (SALIVAMAX) PACK Use as directed 1 Package in the mouth or throat. mix with 89mL's of H20; swish and spit BID; may keep in bathroom; may self administer.  Marland Kitchen aspirin 81 MG chewable tablet Chew 81 mg by mouth daily.  Marland Kitchen atenolol (TENORMIN) 25 MG tablet Take 12.5 mg by mouth 2 (two) times daily. Hold if SBP <  100 or HR < 60  . Biotin 5000 MCG TABS Take 5,000 mcg by mouth daily.  . famotidine (PEPCID) 20 MG tablet Take 20 mg by mouth daily.  . fluticasone (FLONASE) 50 MCG/ACT nasal spray Place 2 sprays into both nostrils daily.  . hydrogen peroxide 1.5 % SOLN  Use 15 ml orally, swish and spit as needed for dry mouth.  Marland Kitchen ipratropium (ATROVENT) 0.06 % nasal spray Place 2 sprays into both nostrils 2 (two) times daily as needed for rhinitis.  Prudencio Burly ([place]) [place] by mouth. Take 1 in the morning.  . lactose free nutrition (BOOST PLUS) LIQD Take 237 mLs by mouth 2 (two) times daily with a meal. Per request of resident, states can't eat if takes it later   . Lidocaine 4 % PTCH Apply 1 patch topically. Remove patch at bedtime  . lidocaine-prilocaine (EMLA) cream Apply 1 application topically daily as needed.  . loratadine (CLARITIN) 10 MG tablet Take 10 mg by mouth daily as needed for allergies.  . Menthol, Topical Analgesic, (BIOFREEZE ROLL-ON) 4 % GEL Apply 1 application topically every 8 (eight) hours. Apply to LLE  . Multiple Vitamins-Minerals (MULTIVITAL) tablet Take 1 tablet by mouth daily.  . ondansetron (ZOFRAN) 4 MG tablet Take 4 mg by mouth every 6 (six) hours as needed for nausea or vomiting.  Marland Kitchen Peppermint Oil (IBGARD) 90 MG CPCR Take 1 capsule by mouth 2 (two) times daily.  . polyethylene glycol (MIRALAX / GLYCOLAX) packet Take 17 g daily as needed by mouth (for constipation).   . pregabalin (LYRICA) 25 MG capsule One capsule in the morning and 2 in the evening  . Propylene Glycol (SYSTANE BALANCE) 0.6 % SOLN Give 1 drop to both eyes once daily as needed for dryness  . sennosides-docusate sodium (SENOKOT-S) 8.6-50 MG tablet Take 2 tablets by mouth daily.   . sodium phosphate (FLEET) 7-19 GM/118ML ENEM Place 1 enema rectally as needed for severe constipation. Give Fleet enema per rectum X 1 after removing hard stool or if soft stool is present in rectum.  . traMADol (ULTRAM) 50 MG tablet Take 1 tablet (50 mg total) by mouth 2 (two) times daily. Also take one by mouth once daily as needed  . triamcinolone cream (KENALOG) 0.1 % Apply 1 application topically as needed. Apply to itchy areas as needed. May keep in room  and self apply   Facility-Administered Encounter Medications as of 09/26/2018  Medication  . Bevacizumab (AVASTIN) SOLN 1.25 mg  . Bevacizumab (AVASTIN) SOLN 1.25 mg    Review of Systems  Immunization History  Administered Date(s) Administered  . Influenza Split 10/20/2011, 11/03/2012  . Influenza,inj,Quad PF,6+ Mos 09/29/2012, 11/09/2013  . Influenza-Unspecified 10/17/2014, 10/23/2015, 10/20/2016, 10/17/2017  . PPD Test 08/21/2013, 03/04/2014  . Pneumococcal Conjugate-13 08/21/2013  . Pneumococcal Polysaccharide-23 09/08/2016  . Tdap 05/20/2008  . Zoster 11/01/2012   Pertinent  Health Maintenance Due  Topic Date Due  . INFLUENZA VACCINE  08/12/2018  . DEXA SCAN  Completed  . PNA vac Low Risk Adult  Completed   Fall Risk  11/04/2017 09/05/2017 09/22/2016 08/25/2016 05/31/2016  Falls in the past year? Yes Yes Yes Yes No  Number falls in past yr: 2 or more 2 or more 1 2 or more -  Comment - - - - -  Injury with Fall? Yes Yes Yes Yes -  Comment - - fx 3 ribs - -  Risk Factor Category  - - - - -  Risk  for fall due to : - - Impaired mobility;Impaired balance/gait - -  Risk for fall due to: Comment - - - - -   Functional Status Survey:    Vitals:   09/26/18 0933  BP: (!) 170/88  Pulse: 75  Resp: 20  Temp: 97.7 F (36.5 C)  SpO2: 95%  Weight: 103 lb 6.4 oz (46.9 kg)  Height: 5\' 2"  (1.575 m)   Body mass index is 18.91 kg/m. Physical Exam  Labs reviewed: Recent Labs    03/06/18 03/30/18 1252 04/06/18  NA  --  134* 138  K  --  4.3 4.6  CL  --  100  --   CO2  --  26  --   GLUCOSE  --  109*  --   BUN 17 16 17   CREATININE 0.8 0.93 0.9  CALCIUM  --  9.7  --    Recent Labs    03/06/18 03/30/18 1252  AST 21 30  ALT 12 16  ALKPHOS  --  77  BILITOT  --  0.6  PROT  --  6.2*  ALBUMIN  --  3.6   Recent Labs    03/06/18 03/30/18 1252 04/06/18  WBC  --  8.1  --   HGB 12.4 13.2 13.2  HCT 36 40.1 39  MCV  --  102.6*  --   PLT 217 196 200   Lab Results   Component Value Date   TSH 3.44 08/04/2017   Lab Results  Component Value Date   HGBA1C 6.4 07/07/2015   Lab Results  Component Value Date   CHOL 187 11/03/2017   HDL 84 (A) 11/03/2017   LDLCALC 80 11/03/2017   TRIG 136 11/03/2017   CHOLHDL 2.1 07/29/2016    Significant Diagnostic Results in last 30 days:  No results found.  Assessment/Plan There are no diagnoses linked to this encounter.   Family/ staff Communication:  Labs/tests ordered:     This encounter was created in error - please disregard.

## 2018-10-02 DIAGNOSIS — L821 Other seborrheic keratosis: Secondary | ICD-10-CM | POA: Diagnosis not present

## 2018-10-02 DIAGNOSIS — L720 Epidermal cyst: Secondary | ICD-10-CM | POA: Diagnosis not present

## 2018-10-02 DIAGNOSIS — D229 Melanocytic nevi, unspecified: Secondary | ICD-10-CM | POA: Diagnosis not present

## 2018-10-02 DIAGNOSIS — D1801 Hemangioma of skin and subcutaneous tissue: Secondary | ICD-10-CM | POA: Diagnosis not present

## 2018-10-02 DIAGNOSIS — I8393 Asymptomatic varicose veins of bilateral lower extremities: Secondary | ICD-10-CM | POA: Diagnosis not present

## 2018-10-02 DIAGNOSIS — L814 Other melanin hyperpigmentation: Secondary | ICD-10-CM | POA: Diagnosis not present

## 2018-10-02 DIAGNOSIS — L708 Other acne: Secondary | ICD-10-CM | POA: Diagnosis not present

## 2018-10-03 ENCOUNTER — Other Ambulatory Visit: Payer: Self-pay | Admitting: *Deleted

## 2018-10-03 MED ORDER — PREGABALIN 25 MG PO CAPS: ORAL_CAPSULE | ORAL | 0 refills | Status: DC

## 2018-10-03 NOTE — Telephone Encounter (Signed)
Received fax refill order Pended and sent to Dr. Gupta for approval.  

## 2018-10-23 ENCOUNTER — Encounter: Payer: Self-pay | Admitting: Internal Medicine

## 2018-10-23 ENCOUNTER — Other Ambulatory Visit: Payer: Self-pay | Admitting: *Deleted

## 2018-10-23 ENCOUNTER — Non-Acute Institutional Stay: Payer: Medicare Other | Admitting: Internal Medicine

## 2018-10-23 DIAGNOSIS — G3184 Mild cognitive impairment, so stated: Secondary | ICD-10-CM | POA: Diagnosis not present

## 2018-10-23 DIAGNOSIS — I4891 Unspecified atrial fibrillation: Secondary | ICD-10-CM | POA: Diagnosis not present

## 2018-10-23 DIAGNOSIS — M8949 Other hypertrophic osteoarthropathy, multiple sites: Secondary | ICD-10-CM | POA: Diagnosis not present

## 2018-10-23 DIAGNOSIS — G629 Polyneuropathy, unspecified: Secondary | ICD-10-CM

## 2018-10-23 DIAGNOSIS — M159 Polyosteoarthritis, unspecified: Secondary | ICD-10-CM

## 2018-10-23 MED ORDER — PREGABALIN 50 MG PO CAPS: 50.0000 mg | ORAL_CAPSULE | Freq: Every day | ORAL | 0 refills | Status: DC

## 2018-10-23 NOTE — Telephone Encounter (Signed)
Received fax refill Request Pended Rx and sent to Dr. Gupta for approval.  

## 2018-10-23 NOTE — Progress Notes (Signed)
Location:    [facility] Home Room Number: 1 Place of Service:  ALF [phone]) Provider:  [name], MD  [name], MD  Patient Care Team: [name], MD as PCP - General (Internal Medicine) Angelia Mould, MD as Attending Physician (Vascular Surgery) [name], MD (Inactive) as Attending Physician (Obstetrics and Gynecology) [name], MD (Orthopedic Surgery) [name], MD (Ophthalmology) [name], MD as Consulting Physician (Urology) [name], MD as Consulting Physician (Cardiology) [name], PA-C as Physician Assistant (Otolaryngology) [name], MD as Consulting Physician (Otolaryngology) [name], MD as Consulting Physician (Dermatology) [name], MD as Consulting Physician (Physical Medicine and Rehabilitation) [name], MD as Attending Physician (Gastroenterology) Mast, Man X, NP as Nurse Practitioner (Internal Medicine) Kathrynn Ducking, MD as Consulting Physician (Neurology) Debara Pickett Nadean Corwin, MD as Consulting Physician (Cardiology) Druscilla Brownie, MD as Consulting Physician (Dermatology) [name], [name], NP as Nurse Practitioner (Family Medicine)  Extended Emergency Contact Information Primary Emergency Contact: Vanessa [name] Phone: [phone] Mobile Phone: [phone] Relation: Son Secondary Emergency Contact: [name] Mobile Phone: [phone] Relation: Friend  Code Status:  Full Code Goals of care: Advanced Directive information Advanced Directives 09/26/2018  Does Patient Have a Medical Advance Directive? No  Type of Advance Directive -  Does patient want to make changes to medical advance directive? No - Patient declined  Copy of [place] in Chart? -  Would patient like information on creating a medical advance directive? No - Patient declined     Chief Complaint  Patient presents with  . Medical Management of Chronic Issues     HPI:  Pt is a 85 y.o. [name] seen today for medical management of chronic diseases.   Patient has h/ochronic atrial fibrillation not on any coagulation, GERD, hypothyroidism, hyperlipidemia, neuropathy,, cognitive impairment, H/o Syncope,  h/o IBS and Esophageal Stenosis  Recent Retinal Vein Thrombosis with Macular edema,s/p Vitreal injections Patient lives in [place].she is independent in her ADLs.  Walks with a walker Has been stable in the facility. Most recently seen by GI for bloating and IBS symptoms. Started on Miralax and IBS gard She states she is feeling better with this. She also has issues with dysphonia.  Had seen Dr. Benjamine Mola before. She said that she is going to make appointment with him for follow-up Patient also follows with Dr. Jannifer Franklin for her neuropathy and recently her dose of Lyrica was increased  Her weight is stable.  No recent falls Past Medical History:  Diagnosis Date  . Abdominal bloating   . Atrial fibrillation ([place])   . Bruises easily   . Carotid artery occlusion    LEFT  . Dizziness   . DOE (dyspnea on exertion) 04/02/2014  . Fall at home Sept 2013, Dec. 2013  Jun 08, 2012  . GERD (gastroesophageal reflux disease) 02/11/2015  . Headache(784.0)   . Hoarseness   . Hypercholesterolemia   . Hyperglycemia 04/02/2014   Glucose 178 mg percent on 01/22/2014 04/05/14 Hgb A1c 6.6 Diet controlled.    . Hypothyroidism 04/15/2015  . Neuropathy    PERIPHERAL  . Pruritus   . Scoliosis   . Varicose veins    Past Surgical History:  Procedure Laterality Date  . ABDOMINAL HYSTERECTOMY  1954  . CHOLECYSTECTOMY  1997  . ESOPHAGOGASTRODUODENOSCOPY (EGD) WITH PROPOFOL N/A 11/25/2016   Procedure: ESOPHAGOGASTRODUODENOSCOPY (EGD) WITH PROPOFOL;  Surgeon: [name], MD;  Location: [facility];  Service: Endoscopy;  Laterality: N/A;  .  SPINE SURGERY  1997   correct scoliosis    No Known Allergies  Allergies as of 10/23/2018   No Known Allergies     Medication  List       Accurate as of October 23, 2018  8:59 AM. If you have any questions, ask your nurse or doctor.        acetaminophen 500 MG tablet Commonly known as: TYLENOL Take 500 mg by mouth 2 (two) times daily.   acetaminophen 325 MG tablet Commonly known as: TYLENOL Take 650 mg by mouth every 8 (eight) hours as needed.   aspirin 81 MG chewable tablet Chew 81 mg by mouth daily.   atenolol 25 MG tablet Commonly known as: TENORMIN Take 12.5 mg by mouth 2 (two) times daily. Hold if SBP < 100 or HR < 60   Biofreeze Roll-On 4 % Gel Generic drug: Menthol (Topical Analgesic) Apply 1 application topically every 8 (eight) hours. Apply to LLE   Biotin 5000 MCG Tabs Take 5,000 mcg by mouth daily.   famotidine 20 MG tablet Commonly known as: PEPCID Take 20 mg by mouth daily.   fluticasone 50 MCG/ACT nasal spray Commonly known as: FLONASE Place 2 sprays into both nostrils daily.   hydrogen peroxide 1.5 % Soln Use 15 ml orally, swish and spit as needed for dry mouth.   IBgard 90 MG Cpcr Generic drug: Peppermint Oil Take 2 capsules by mouth 2 (two) times daily.   ipratropium 0.06 % nasal spray Commonly known as: ATROVENT Place 2 sprays into both nostrils 2 (two) times daily as needed for rhinitis.   lactose free nutrition Liqd Take 237 mLs by mouth 2 (two) times daily with a meal. Per request of resident, states can't eat if takes it later   Lidocaine 4 % Ptch Apply 1 patch topically. Remove patch at bedtime   lidocaine-prilocaine cream Commonly known as: EMLA Apply 1 application topically daily as needed.   loratadine 10 MG tablet Commonly known as: CLARITIN Take 10 mg by mouth daily as needed for allergies.   Multivital tablet Take 1 tablet by mouth daily.   ondansetron 4 MG tablet Commonly known as: ZOFRAN Take 4 mg by mouth every 6 (six) hours as needed for nausea or vomiting.   polyethylene glycol 17 g packet Commonly known as: MIRALAX / GLYCOLAX Take 17 g  daily as needed by mouth (for constipation).   pregabalin 50 MG capsule Commonly known as: LYRICA Take 50 mg by mouth daily. Continue with Lyrica 25mg  QAM   pregabalin 25 MG capsule Commonly known as: Lyrica Take one capsule by mouth once daily at bedtime   SalivaMAX Pack Use as directed 1 Package in the mouth or throat. mix with 62mL's of H20; swish and spit BID; may keep in bathroom; may self administer.   sennosides-docusate sodium 8.6-50 MG tablet Commonly known as: SENOKOT-S Take 2 tablets by mouth daily.   sodium phosphate 7-19 GM/118ML Enem Place 1 enema rectally as needed for severe constipation. Give Fleet enema per rectum X 1 after removing hard stool or if soft stool is present in rectum.   Systane Balance 0.6 % Soln Generic drug: Propylene Glycol Give 1 drop to both eyes once daily as needed for dryness   traMADol 50 MG tablet Commonly known as: ULTRAM Take 1 tablet (50 mg total) by mouth 2 (two) times daily. Also take one by mouth once daily as needed   triamcinolone cream 0.1 % Commonly known as: KENALOG Apply 1 application  topically as needed. Apply to itchy areas as needed. May keep in room and self apply   [facility] by mouth. Take 1 in the morning.       Review of Systems  Constitutional: Negative.   HENT: Positive for voice change.   Gastrointestinal: Positive for constipation.  Genitourinary: Negative.   Musculoskeletal: Negative.   Neurological: Positive for weakness.  Psychiatric/Behavioral: Negative.   All other systems reviewed and are negative.   Immunization History  Administered Date(s) Administered  . Influenza Split 10/20/2011, 11/03/2012  . Influenza,inj,Quad PF,6+ Mos 09/29/2012, 11/09/2013  . Influenza-Unspecified 10/17/2014, 10/23/2015, 10/20/2016, 10/17/2017  . PPD Test 08/21/2013, 03/04/2014  . Pneumococcal Conjugate-13 08/21/2013  . Pneumococcal Polysaccharide-23 09/08/2016  . Tdap 05/20/2008  .  Zoster 11/01/2012   Pertinent  Health Maintenance Due  Topic Date Due  . INFLUENZA VACCINE  08/12/2018  . DEXA SCAN  Completed  . PNA vac Low Risk Adult  Completed   Fall Risk  11/04/2017 09/05/2017 09/22/2016 08/25/2016 05/31/2016  Falls in the past year? Yes Yes Yes Yes No  Number falls in past yr: 2 or more 2 or more 1 2 or more -  Comment - - - - -  Injury with Fall? Yes Yes Yes Yes -  Comment - - fx 3 ribs - -  Risk Factor Category  - - - - -  Risk for fall due to : - - Impaired mobility;Impaired balance/gait - -  Risk for fall due to: Comment - - - - -   Functional Status Survey:    Vitals:   10/23/18 0845  BP: 113/71  Pulse: 87  Resp: 12  Temp: (!) 97.3 F (36.3 C)  SpO2: 94%  Weight: 102 lb (46.3 kg)  Height: 5\' 2"  (1.575 m)   Body mass index is 18.66 kg/m. Physical Exam Vitals signs reviewed.  Constitutional:      Appearance: Normal appearance.  HENT:     Head: Normocephalic.     Mouth/Throat:     Mouth: Mucous membranes are moist.     Pharynx: Oropharynx is clear.  Eyes:     Pupils: Pupils are equal, round, and reactive to light.  Neck:     Musculoskeletal: Neck supple.  Cardiovascular:     Rate and Rhythm: Normal rate. Rhythm irregular.     Pulses: Normal pulses.  Pulmonary:     Effort: Pulmonary effort is normal. No respiratory distress.     Breath sounds: Normal breath sounds.  Abdominal:     General: Abdomen is flat. Bowel sounds are normal.     Palpations: Abdomen is soft.  Musculoskeletal:        General: No swelling.  Skin:    General: Skin is warm and dry.  Neurological:     General: No focal deficit present.     Mental Status: She is alert and oriented to person, place, and time.  Psychiatric:        Mood and Affect: Mood normal.     Labs reviewed: Recent Labs    03/06/18 03/30/18 1252 04/06/18  NA  --  134* 138  K  --  4.3 4.6  CL  --  100  --   CO2  --  26  --   GLUCOSE  --  109*  --   BUN 17 16 17   CREATININE 0.8 0.93 0.9   CALCIUM  --  9.7  --    Recent Labs    03/06/18 03/30/18 1252  AST 21 30  ALT 12 16  ALKPHOS  --  77  BILITOT  --  0.6  PROT  --  6.2*  ALBUMIN  --  3.6   Recent Labs    03/06/18 03/30/18 1252 04/06/18  WBC  --  8.1  --   HGB 12.4 13.2 13.2  HCT 36 40.1 39  MCV  --  102.6*  --   PLT 217 196 200   Lab Results  Component Value Date   TSH 3.44 08/04/2017   Lab Results  Component Value Date   HGBA1C 6.4 07/07/2015   Lab Results  Component Value Date   CHOL 187 11/03/2017   HDL 84 (A) 11/03/2017   LDLCALC 80 11/03/2017   TRIG 136 11/03/2017   CHOLHDL 2.1 07/29/2016    Significant Diagnostic Results in last 30 days:  No results found.  Assessment/Plan  Atrial fibrillation with RVR (HCC) Stable on Lopressor No Anticoagulation due to Frailty and Fall risk and GI bleed  Neuropathy Stable on Lyrica  Mild cognitive impairment In [place] Supportive care Weight is stable Constipation Symptoms seemed to be better with IB guard and Miralax Retinal Vein Oclusion with Macular edema Follows very closely with Opthalmologist  Arthritis On tramadol PRN  Dysphonia Will follow with ENT   TDAP vaccination Ordered    Family/ staff Communication:  Labs/tests ordered:  [facility]

## 2018-10-24 ENCOUNTER — Other Ambulatory Visit: Payer: Self-pay | Admitting: *Deleted

## 2018-10-24 ENCOUNTER — Encounter (INDEPENDENT_AMBULATORY_CARE_PROVIDER_SITE_OTHER): Payer: Medicare Other | Admitting: Ophthalmology

## 2018-10-24 MED ORDER — TRAMADOL HCL 50 MG PO TABS: 50.0000 mg | ORAL_TABLET | Freq: Two times a day (BID) | ORAL | 0 refills | Status: DC

## 2018-10-24 NOTE — Telephone Encounter (Signed)
Received Fax Refill Request from [facility] and sent to Dr. Lyndel Safe for approval.

## 2018-10-26 DIAGNOSIS — F0390 Unspecified dementia without behavioral disturbance: Secondary | ICD-10-CM | POA: Diagnosis not present

## 2018-10-26 DIAGNOSIS — D649 Anemia, unspecified: Secondary | ICD-10-CM | POA: Diagnosis not present

## 2018-10-26 DIAGNOSIS — E039 Hypothyroidism, unspecified: Secondary | ICD-10-CM | POA: Diagnosis not present

## 2018-10-27 ENCOUNTER — Encounter: Payer: Self-pay | Admitting: Nurse Practitioner

## 2018-10-27 ENCOUNTER — Non-Acute Institutional Stay: Payer: Medicare Other | Admitting: Nurse Practitioner

## 2018-10-27 DIAGNOSIS — M159 Polyosteoarthritis, unspecified: Secondary | ICD-10-CM

## 2018-10-27 DIAGNOSIS — G629 Polyneuropathy, unspecified: Secondary | ICD-10-CM | POA: Diagnosis not present

## 2018-10-27 DIAGNOSIS — I4891 Unspecified atrial fibrillation: Secondary | ICD-10-CM | POA: Diagnosis not present

## 2018-10-27 DIAGNOSIS — K5900 Constipation, unspecified: Secondary | ICD-10-CM

## 2018-10-27 NOTE — Assessment & Plan Note (Signed)
Stable, continue Lyrica

## 2018-10-27 NOTE — Assessment & Plan Note (Signed)
Not well controlled, will increase Senokot S III po qhs, continue IBGARD II bid, prn MiraLax.

## 2018-10-27 NOTE — Assessment & Plan Note (Signed)
Stable, continue Tramadol 50mg  bid prn,  Tylenol 500mg  bid.

## 2018-10-27 NOTE — Progress Notes (Signed)
Location:   [place] Room Number: 1 Place of Service:  ALF (13) Provider:  Adiba Fargnoli NP  [name], MD  Patient Care Team: [name], MD as PCP - General (Internal Medicine) Angelia Mould, MD as Attending Physician (Vascular Surgery) [name], MD (Inactive) as Attending Physician (Obstetrics and Gynecology) [name], MD (Orthopedic Surgery) [name], MD (Ophthalmology) [name], MD as Consulting Physician (Urology) [name], MD as Consulting Physician (Cardiology) [name], PA-C as Physician Assistant (Otolaryngology) [name], MD as Consulting Physician (Otolaryngology) [name], MD as Consulting Physician (Dermatology) [name], MD as Consulting Physician (Physical Medicine and Rehabilitation) [name], MD as Attending Physician (Gastroenterology) Mela Perham X, NP as Nurse Practitioner (Internal Medicine) Kathrynn Ducking, MD as Consulting Physician (Neurology) Debara Pickett Nadean Corwin, MD as Consulting Physician (Cardiology) Druscilla Brownie, MD as Consulting Physician (Dermatology) [name], [name], NP as Nurse Practitioner (Family Medicine)  Extended Emergency Contact Information Primary Emergency Contact: [name] of [place] Phone: [phone] Mobile Phone: [phone] Relation: Son Secondary Emergency Contact: [name] Mobile Phone: [phone] Relation: Friend  Code Status:  Full Code Goals of care: Advanced Directive information Advanced Directives 10/27/2018  Does Patient Have a Medical Advance Directive? No  Type of Advance Directive -  Does patient want to make changes to medical advance directive? No - Patient declined  Copy of [place] in Chart? -  Would patient like information on creating a medical advance directive? -     Chief Complaint  Patient presents with  . Acute Visit    Constipation    HPI:  Pt is a 85 y.o.  female seen today for an acute visit for c/o constipation, last BM was yesterday, on Senokot S II qhs, IBGARD II bid, prn MiraLax. OA pain, multiple sites, stable, on Tramadol 50mg  bid prn,  Tylenol 500mg  bid. AFib, heart rate is in control, on Atenolol 12.5mg  bid. Peripheral neuropathic pain, controlled on Lyrica    Past Medical History:  Diagnosis Date  . Abdominal bloating   . Atrial fibrillation ([place])   . Bruises easily   . Carotid artery occlusion    LEFT  . Dizziness   . DOE (dyspnea on exertion) 04/02/2014  . Fall at home Sept 2013, Dec. 2013  Jun 08, 2012  . GERD (gastroesophageal reflux disease) 02/11/2015  . Headache(784.0)   . Hoarseness   . Hypercholesterolemia   . Hyperglycemia 04/02/2014   Glucose 178 mg percent on 01/22/2014 04/05/14 Hgb A1c 6.6 Diet controlled.    . Hypothyroidism 04/15/2015  . Neuropathy    PERIPHERAL  . Pruritus   . Scoliosis   . Varicose veins    Past Surgical History:  Procedure Laterality Date  . ABDOMINAL HYSTERECTOMY  1954  . CHOLECYSTECTOMY  1997  . ESOPHAGOGASTRODUODENOSCOPY (EGD) WITH PROPOFOL N/A 11/25/2016   Procedure: ESOPHAGOGASTRODUODENOSCOPY (EGD) WITH PROPOFOL;  Surgeon: [name], MD;  Location: [facility];  Service: Endoscopy;  Laterality: N/A;  . [place]   correct scoliosis    No Known Allergies  Allergies as of 10/27/2018   No Known Allergies     Medication List       Accurate as of October 27, 2018  3:46 PM. If you have any questions, ask your nurse or doctor.        acetaminophen 500 MG tablet Commonly known as: TYLENOL Take 500 mg by mouth 2 (two) times daily.   acetaminophen  325 MG tablet Commonly known as: TYLENOL Take 650 mg by mouth every 8 (eight) hours as needed.   aspirin 81 MG chewable tablet Chew 81 mg by mouth daily.   atenolol 25 MG tablet Commonly known as: TENORMIN Take 12.5 mg by mouth 2 (two) times daily. Hold if SBP < 100 or HR < 60   Biofreeze Roll-On 4 % Gel  Generic drug: Menthol (Topical Analgesic) Apply 1 application topically every 8 (eight) hours. Apply to LLE   Biotin 5000 MCG Tabs Take 5,000 mcg by mouth daily.   famotidine 20 MG tablet Commonly known as: PEPCID Take 20 mg by mouth daily.   fluticasone 50 MCG/ACT nasal spray Commonly known as: FLONASE Place 2 sprays into both nostrils daily.   hydrogen peroxide 1.5 % Soln Use 15 ml orally, swish and spit as needed for dry mouth.   IBgard 90 MG Cpcr Generic drug: Peppermint Oil Take 2 capsules by mouth 2 (two) times daily.   ipratropium 0.06 % nasal spray Commonly known as: ATROVENT Place 2 sprays into both nostrils 2 (two) times daily as needed for rhinitis.   lactose free nutrition Liqd Take 237 mLs by mouth 2 (two) times daily with a meal. Per request of resident, states can't eat if takes it later   Lidocaine 4 % Ptch Apply 1 patch topically. Remove patch at bedtime   lidocaine-prilocaine cream Commonly known as: EMLA Apply 1 application topically daily as needed.   loratadine 10 MG tablet Commonly known as: CLARITIN Take 10 mg by mouth daily as needed for allergies.   Multivital tablet Take 1 tablet by mouth daily.   ondansetron 4 MG tablet Commonly known as: ZOFRAN Take 4 mg by mouth every 6 (six) hours as needed for nausea or vomiting.   polyethylene glycol 17 g packet Commonly known as: MIRALAX / GLYCOLAX Take 17 g daily as needed by mouth (for constipation).   pregabalin 25 MG capsule Commonly known as: Lyrica Take one capsule by mouth once daily at bedtime   pregabalin 50 MG capsule Commonly known as: LYRICA Take 1 capsule (50 mg total) by mouth at bedtime.   SalivaMAX Pack Use as directed 1 Package in the mouth or throat. mix with 37mL's of H20; swish and spit BID; may keep in bathroom; may self administer.   sennosides-docusate sodium 8.6-50 MG tablet Commonly known as: SENOKOT-S Take 2 tablets by mouth daily.   sodium phosphate 7-19  GM/118ML Enem Place 1 enema rectally as needed for severe constipation. Give Fleet enema per rectum X 1 after removing hard stool or if soft stool is present in rectum.   Systane Balance 0.6 % Soln Generic drug: Propylene Glycol Give 1 drop to both eyes once daily as needed for dryness   traMADol 50 MG tablet Commonly known as: ULTRAM Take 1 tablet (50 mg total) by mouth 2 (two) times daily. What changed: additional instructions   triamcinolone cream 0.1 % Commonly known as: KENALOG Apply 1 application topically as needed. Apply to itchy areas as needed. May keep in room and self apply   [place] by mouth. Take 1 in the morning.      ROS was provided with assistance of staff Review of Systems  Constitutional: Negative for activity change, appetite change, chills, diaphoresis, fatigue and fever.  HENT: Positive for hearing loss and voice change. Negative for congestion.   Respiratory: Negative for cough, shortness of breath and wheezing.   Cardiovascular: Negative for chest pain,  palpitations and leg swelling.  Gastrointestinal: Positive for constipation. Negative for abdominal distention, abdominal pain, diarrhea, nausea and vomiting.  Genitourinary: Negative for difficulty urinating, dysuria, frequency and urgency.  Musculoskeletal: Positive for arthralgias and gait problem.  Skin: Negative for color change and pallor.  Neurological: Negative for dizziness, speech difficulty, weakness and headaches.       Memory lapses.   Psychiatric/Behavioral: Negative for agitation, behavioral problems, hallucinations and sleep disturbance. The patient is not nervous/anxious.     Immunization History  Administered Date(s) Administered  . Influenza Split 10/20/2011, 11/03/2012  . Influenza,inj,Quad PF,6+ Mos 09/29/2012, 11/09/2013  . Influenza-Unspecified 10/17/2014, 10/23/2015, 10/20/2016, 10/17/2017  . PPD Test 08/21/2013, 03/04/2014  . Pneumococcal  Conjugate-13 08/21/2013  . Pneumococcal Polysaccharide-23 09/08/2016  . Tdap 05/20/2008  . Zoster 11/01/2012   Pertinent  Health Maintenance Due  Topic Date Due  . INFLUENZA VACCINE  08/12/2018  . DEXA SCAN  Completed  . PNA vac Low Risk Adult  Completed   Fall Risk  11/04/2017 09/05/2017 09/22/2016 08/25/2016 05/31/2016  Falls in the past year? Yes Yes Yes Yes No  Number falls in past yr: 2 or more 2 or more 1 2 or more -  Comment - - - - -  Injury with Fall? Yes Yes Yes Yes -  Comment - - fx 3 ribs - -  Risk Factor Category  - - - - -  Risk for fall due to : - - Impaired mobility;Impaired balance/gait - -  Risk for fall due to: Comment - - - - -   Functional Status Survey:    Vitals:   10/27/18 1456  BP: (!) 160/87  Pulse: 70  Resp: 20  Temp: (!) 97.5 F (36.4 C)  SpO2: 98%  Weight: 102 lb (46.3 kg)  Height: 5\' 2"  (1.575 m)   Body mass index is 18.66 kg/m. Physical Exam Vitals signs and nursing note reviewed.  Constitutional:      General: She is not in acute distress.    Appearance: Normal appearance. She is normal weight. She is not ill-appearing, toxic-appearing or diaphoretic.  HENT:     Head: Normocephalic and atraumatic.     Nose: Nose normal.     Mouth/Throat:     Mouth: Mucous membranes are moist.  Eyes:     Extraocular Movements: Extraocular movements intact.     Conjunctiva/sclera: Conjunctivae normal.     Pupils: Pupils are equal, round, and reactive to light.  Neck:     Musculoskeletal: Normal range of motion and neck supple.  Cardiovascular:     Rate and Rhythm: Normal rate. Rhythm irregular.     Heart sounds: No murmur.  Pulmonary:     Breath sounds: No wheezing, rhonchi or rales.  Abdominal:     General: Bowel sounds are normal. There is no distension.     Palpations: Abdomen is soft.     Tenderness: There is no abdominal tenderness. There is no right CVA tenderness, left CVA tenderness, guarding or rebound.  Musculoskeletal:     Right lower  leg: No edema.     Left lower leg: No edema.  Skin:    General: Skin is warm.     Findings: No bruising, erythema, lesion or rash.  Neurological:     General: No focal deficit present.     Mental Status: She is alert. Mental status is at baseline.     Cranial Nerves: No cranial nerve deficit.     Motor: No weakness.  Coordination: Coordination normal.     Gait: Gait abnormal.     Comments: Oriented to person, place.   Psychiatric:        Mood and Affect: Mood normal.        Behavior: Behavior normal.        Thought Content: Thought content normal.        Judgment: Judgment normal.     Labs reviewed: Recent Labs    03/06/18 03/30/18 1252 04/06/18  NA  --  134* 138  K  --  4.3 4.6  CL  --  100  --   CO2  --  26  --   GLUCOSE  --  109*  --   BUN 17 16 17   CREATININE 0.8 0.93 0.9  CALCIUM  --  9.7  --    Recent Labs    03/06/18 03/30/18 1252  AST 21 30  ALT 12 16  ALKPHOS  --  77  BILITOT  --  0.6  PROT  --  6.2*  ALBUMIN  --  3.6   Recent Labs    03/06/18 03/30/18 1252 04/06/18  WBC  --  8.1  --   HGB 12.4 13.2 13.2  HCT 36 40.1 39  MCV  --  102.6*  --   PLT 217 196 200   Lab Results  Component Value Date   TSH 3.44 08/04/2017   Lab Results  Component Value Date   HGBA1C 6.4 07/07/2015   Lab Results  Component Value Date   CHOL 187 11/03/2017   HDL 84 (A) 11/03/2017   LDLCALC 80 11/03/2017   TRIG 136 11/03/2017   CHOLHDL 2.1 07/29/2016    Significant Diagnostic Results in last 30 days:  No results found.  Assessment/Plan Constipation Not well controlled, will increase Senokot S III po qhs, continue IBGARD II bid, prn MiraLax.   Osteoarthritis Stable, continue Tramadol 50mg  bid prn,  Tylenol 500mg  bid.  Atrial fibrillation with RVR (HCC) Heart rate is in control, Sbp 160 today, she denied headache, dizziness, cheat pain/pressure or palpiation, continue Atenolol 12.5mg  bid. Observe Bp, HR.   Neuropathy Stable, continue Lyrica.       Family/ staff Communication: plan of care reviewed with the patient and charge nurse.   Labs/tests ordered:  none  Time spend 40 minutes.

## 2018-10-27 NOTE — Assessment & Plan Note (Addendum)
Heart rate is in control, Sbp 160 today, she denied headache, dizziness, cheat pain/pressure or palpiation, continue Atenolol 12.5mg  bid. Observe Bp, HR.

## 2018-10-31 ENCOUNTER — Encounter: Payer: Self-pay | Admitting: Nurse Practitioner

## 2018-10-31 NOTE — Progress Notes (Signed)
This encounter was created in error - please disregard.

## 2018-11-02 DIAGNOSIS — M545 Low back pain: Secondary | ICD-10-CM | POA: Diagnosis not present

## 2018-11-03 ENCOUNTER — Encounter: Payer: Self-pay | Admitting: Nurse Practitioner

## 2018-11-03 ENCOUNTER — Non-Acute Institutional Stay: Payer: Medicare Other | Admitting: Nurse Practitioner

## 2018-11-03 DIAGNOSIS — I4891 Unspecified atrial fibrillation: Secondary | ICD-10-CM | POA: Diagnosis not present

## 2018-11-03 DIAGNOSIS — K5900 Constipation, unspecified: Secondary | ICD-10-CM

## 2018-11-03 NOTE — Assessment & Plan Note (Signed)
Continue Senokot S II qhs.

## 2018-11-03 NOTE — Assessment & Plan Note (Signed)
Heart rate is in control, continue Atenolol 12.5mg bid.  

## 2018-11-03 NOTE — Assessment & Plan Note (Signed)
2007 CT lumbar spine: T12-L1: Mild circumferential disc bulge with no spinal stenosis. Neural foramina widely patent. Conus terminates at L1. L1-L2: No significant bulge, protrusion or herniation. Neural foramina are widely patent.  11/02/18 X-ray Lumbar spine: compression fracture of indeterminate age of L1 and L2 vertebral bodies. Narrowing of the L1-L2 and L2-L3 intervertebral disc spaces. S/p posterior surgical fusion of L3, L4, L5, S1, Dextroscoliosis of the upper lumbar spine.   11/03/18 Ortho if the patient desires, increase Tylenol 500mg  tid.

## 2018-11-03 NOTE — Progress Notes (Signed)
Location:   [place] Room Number: 1 Place of Service:  ALF (13) Provider:  Cayton Cuevas  NP  [name], MD  Patient Care Team: [name], MD as PCP - General (Internal Medicine) Angelia Mould, MD as Attending Physician (Vascular Surgery) [name], MD (Inactive) as Attending Physician (Obstetrics and Gynecology) [name], MD (Orthopedic Surgery) [name], MD (Ophthalmology) [name], MD as Consulting Physician (Urology) [name], MD as Consulting Physician (Cardiology) [name], PA-C as Physician Assistant (Otolaryngology) [name], MD as Consulting Physician (Otolaryngology) [name], MD as Consulting Physician (Dermatology) [name], MD as Consulting Physician (Physical Medicine and Rehabilitation) [name], MD as Attending Physician (Gastroenterology) Nylia Gavina X, NP as Nurse Practitioner (Internal Medicine) Kathrynn Ducking, MD as Consulting Physician (Neurology) Debara Pickett Nadean Corwin, MD as Consulting Physician (Cardiology) Druscilla Brownie, MD as Consulting Physician (Dermatology) [name], [name], NP as Nurse Practitioner (Family Medicine)  Extended Emergency Contact Information Primary Emergency Contact: [name] of [employer] Phone: [phone] Mobile Phone: [phone] Relation: Son Secondary Emergency Contact: [name] Mobile Phone: [phone] Relation: Friend  Code Status:  DNR Goals of care: Advanced Directive information Advanced Directives 10/27/2018  Does Patient Have a Medical Advance Directive? No  Type of Advance Directive -  Does patient want to make changes to medical advance directive? No - Patient declined  Copy of [place] in Chart? -  Would patient like information on creating a medical advance directive? -     Chief Complaint  Patient presents with  . Acute Visit    Lower back pain    HPI:  Pt is a 85 y.o.  female seen today for an acute visit for c/o worsened right lower back pain, not new, denied injury, dysuria, or pain radiating to legs. 2007 CT lumbar spine: T12-L1: Mild circumferential disc bulge with no spinal stenosis. Neural foramina widely patent. Conus terminates at L1. L1-L2: No significant bulge, protrusion or herniation. Neural foramina are widely patent.  11/02/18 X-ray Lumbar spine: compression fracture of indeterminate age of L1 and L2 vertebral bodies. Narrowing of the L1-L2 and L2-L3 intervertebral disc spaces. S/p posterior surgical fusion of L3, L4, L5, S1, Dextroscoliosis of the upper lumbar spine.  She takes Tramadol 50mg  bid, bid prn, Lyrica 75mg  qd, Tylenol 500mg  bid.   Constipation, c/o loose stools since Senokot I II bid, desires to reduce to daily.   Afib, heart rate is in control,  Sbp in 140s, on Atenolol 12.5mg  bid     Past Medical History:  Diagnosis Date  . Abdominal bloating   . Atrial fibrillation ([place])   . Bruises easily   . Carotid artery occlusion    LEFT  . Dizziness   . DOE (dyspnea on exertion) 04/02/2014  . Fall at home Sept 2013, Dec. 2013  Jun 08, 2012  . GERD (gastroesophageal reflux disease) 02/11/2015  . Headache(784.0)   . Hoarseness   . Hypercholesterolemia   . Hyperglycemia 04/02/2014   Glucose 178 mg percent on 01/22/2014 04/05/14 Hgb A1c 6.6 Diet controlled.    . Hypothyroidism 04/15/2015  . Neuropathy    PERIPHERAL  . Pruritus   . Scoliosis   . Varicose veins    Past Surgical History:  Procedure Laterality Date  . ABDOMINAL HYSTERECTOMY  1954  . CHOLECYSTECTOMY  1997  . ESOPHAGOGASTRODUODENOSCOPY (EGD) WITH PROPOFOL N/A 11/25/2016   Procedure: ESOPHAGOGASTRODUODENOSCOPY (EGD) WITH PROPOFOL;  Surgeon: [name], MD;  Location:  [facility];  Service: Endoscopy;  Laterality: N/A;  . [place]   correct scoliosis    No Known Allergies  Allergies as of 11/03/2018   No Known Allergies     Medication List        Accurate as of November 03, 2018  2:11 PM. If you have any questions, ask your nurse or doctor.        acetaminophen 500 MG tablet Commonly known as: TYLENOL Take 500 mg by mouth 2 (two) times daily.   acetaminophen 325 MG tablet Commonly known as: TYLENOL Take 650 mg by mouth every 8 (eight) hours as needed.   aspirin 81 MG chewable tablet Chew 81 mg by mouth daily.   atenolol 25 MG tablet Commonly known as: TENORMIN Take 12.5 mg by mouth 2 (two) times daily. Hold if SBP < 100 or HR < 60   Biofreeze Roll-On 4 % Gel Generic drug: Menthol (Topical Analgesic) Apply 1 application topically every 8 (eight) hours. Apply to LLE   Biotin 5000 MCG Tabs Take 5,000 mcg by mouth daily.   famotidine 20 MG tablet Commonly known as: PEPCID Take 20 mg by mouth daily.   fluticasone 50 MCG/ACT nasal spray Commonly known as: FLONASE Place 2 sprays into both nostrils daily.   hydrogen peroxide 1.5 % Soln Use 15 ml orally, swish and spit as needed for dry mouth.   IBgard 90 MG Cpcr Generic drug: Peppermint Oil Take 2 capsules by mouth 2 (two) times daily.   ipratropium 0.06 % nasal spray Commonly known as: ATROVENT Place 2 sprays into both nostrils 2 (two) times daily as needed for rhinitis.   lactose free nutrition Liqd Take 237 mLs by mouth 2 (two) times daily with a meal. Per request of resident, states can't eat if takes it later   Lidocaine 4 % Ptch Apply 1 patch topically. Remove patch at bedtime   lidocaine-prilocaine cream Commonly known as: EMLA Apply 1 application topically daily as needed.   loratadine 10 MG tablet Commonly known as: CLARITIN Take 10 mg by mouth daily as needed for allergies.   Multivital tablet Take 1 tablet by mouth daily.   ondansetron 4 MG tablet Commonly known as: ZOFRAN Take 4 mg by mouth every 6 (six) hours as needed for nausea or vomiting.   polyethylene glycol 17 g packet Commonly known as: MIRALAX / GLYCOLAX Take 17 g daily as  needed by mouth (for constipation).   pregabalin 25 MG capsule Commonly known as: Lyrica Take one capsule by mouth once daily at bedtime   pregabalin 50 MG capsule Commonly known as: LYRICA Take 1 capsule (50 mg total) by mouth at bedtime.   SalivaMAX Pack Use as directed 1 Package in the mouth or throat. mix with 65mL's of H20; swish and spit BID; may keep in bathroom; may self administer.   sennosides-docusate sodium 8.6-50 MG tablet Commonly known as: SENOKOT-S Take 2 tablets by mouth daily.   sodium phosphate 7-19 GM/118ML Enem Place 1 enema rectally as needed for severe constipation. Give Fleet enema per rectum X 1 after removing hard stool or if soft stool is present in rectum.   Systane Balance 0.6 % Soln Generic drug: Propylene Glycol Give 1 drop to both eyes once daily as needed for dryness   traMADol 50 MG tablet Commonly known as: ULTRAM Take 1 tablet (50 mg total) by mouth 2 (two) times daily. What changed: additional instructions   triamcinolone cream 0.1 % Commonly known as:  KENALOG Apply 1 application topically as needed. Apply to itchy areas as needed. May keep in room and self apply   [facility] by mouth. Take 1 in the morning.       Review of Systems  Constitutional: Negative for activity change, appetite change, chills, diaphoresis, fatigue and fever.  HENT: Positive for hearing loss. Negative for congestion and voice change.   Respiratory: Negative for cough, shortness of breath and wheezing.   Cardiovascular: Negative for chest pain, palpitations and leg swelling.  Gastrointestinal: Negative for abdominal distention, abdominal pain, constipation, diarrhea, nausea and vomiting.  Genitourinary: Negative for difficulty urinating, dysuria and urgency.  Musculoskeletal: Positive for arthralgias, back pain and gait problem. Negative for joint swelling and myalgias.       C/o lower back  Pain, mostly on the right side, doesn't  travel to legs.  Skin: Negative for color change and pallor.  Neurological: Negative for dizziness, speech difficulty, weakness, numbness and headaches.  Psychiatric/Behavioral: Negative for agitation, behavioral problems, hallucinations and sleep disturbance. The patient is not nervous/anxious.     Immunization History  Administered Date(s) Administered  . Influenza Split 10/20/2011, 11/03/2012  . Influenza,inj,Quad PF,6+ Mos 09/29/2012, 11/09/2013  . Influenza-Unspecified 10/17/2014, 10/23/2015, 10/20/2016, 10/17/2017  . PPD Test 08/21/2013, 03/04/2014  . Pneumococcal Conjugate-13 08/21/2013  . Pneumococcal Polysaccharide-23 09/08/2016  . Tdap 05/20/2008  . Zoster 11/01/2012   Pertinent  Health Maintenance Due  Topic Date Due  . INFLUENZA VACCINE  Completed  . DEXA SCAN  Completed  . PNA vac Low Risk Adult  Completed   Fall Risk  11/04/2017 09/05/2017 09/22/2016 08/25/2016 05/31/2016  Falls in the past year? Yes Yes Yes Yes No  Number falls in past yr: 2 or more 2 or more 1 2 or more -  Comment - - - - -  Injury with Fall? Yes Yes Yes Yes -  Comment - - fx 3 ribs - -  Risk Factor Category  - - - - -  Risk for fall due to : - - Impaired mobility;Impaired balance/gait - -  Risk for fall due to: Comment - - - - -   Functional Status Survey:    Vitals:   11/03/18 1105  BP: (!) 148/62  Pulse: 70  Resp: 20  Temp: (!) 97.2 F (36.2 C)  SpO2: 94%  Weight: 102 lb (46.3 kg)  Height: 5\' 2"  (1.575 m)   Body mass index is 18.66 kg/m. Physical Exam Vitals signs and nursing note reviewed.  Constitutional:      General: She is not in acute distress.    Appearance: Normal appearance. She is not ill-appearing, toxic-appearing or diaphoretic.  HENT:     Head: Normocephalic and atraumatic.     Nose: Nose normal.     Mouth/Throat:     Mouth: Mucous membranes are moist.  Eyes:     Extraocular Movements: Extraocular movements intact.     Conjunctiva/sclera: Conjunctivae normal.      Pupils: Pupils are equal, round, and reactive to light.  Neck:     Musculoskeletal: Normal range of motion and neck supple.  Cardiovascular:     Rate and Rhythm: Normal rate. Rhythm irregular.     Heart sounds: No murmur.  Pulmonary:     Breath sounds: No wheezing, rhonchi or rales.  Abdominal:     General: Bowel sounds are normal. There is no distension.     Palpations: Abdomen is soft.     Tenderness: There is no abdominal  tenderness. There is no right CVA tenderness, left CVA tenderness, guarding or rebound.  Musculoskeletal:        General: Tenderness present.     Right lower leg: No edema.     Left lower leg: No edema.     Comments: R SLJ pain palpated. Kyphoscoliosis.   Skin:    General: Skin is warm and dry.  Neurological:     General: No focal deficit present.     Mental Status: She is alert. Mental status is at baseline.     Cranial Nerves: No cranial nerve deficit.     Motor: No weakness.     Coordination: Coordination normal.     Gait: Gait abnormal.     Comments: Oriented to person, place.   Psychiatric:        Mood and Affect: Mood normal.        Behavior: Behavior normal.        Thought Content: Thought content normal.        Judgment: Judgment normal.     Labs reviewed: Recent Labs    03/06/18 03/30/18 1252 04/06/18  NA  --  134* 138  K  --  4.3 4.6  CL  --  100  --   CO2  --  26  --   GLUCOSE  --  109*  --   BUN 17 16 17   CREATININE 0.8 0.93 0.9  CALCIUM  --  9.7  --    Recent Labs    03/06/18 03/30/18 1252  AST 21 30  ALT 12 16  ALKPHOS  --  77  BILITOT  --  0.6  PROT  --  6.2*  ALBUMIN  --  3.6   Recent Labs    03/06/18 03/30/18 1252 04/06/18  WBC  --  8.1  --   HGB 12.4 13.2 13.2  HCT 36 40.1 39  MCV  --  102.6*  --   PLT 217 196 200   Lab Results  Component Value Date   TSH 3.44 08/04/2017   Lab Results  Component Value Date   HGBA1C 6.4 07/07/2015   Lab Results  Component Value Date   CHOL 187 11/03/2017   HDL 84 (A)  11/03/2017   LDLCALC 80 11/03/2017   TRIG 136 11/03/2017   CHOLHDL 2.1 07/29/2016    Significant Diagnostic Results in last 30 days:  No results found.  Assessment/Plan Chronic lower back pain 2007 CT lumbar spine: T12-L1: Mild circumferential disc bulge with no spinal stenosis. Neural foramina widely patent. Conus terminates at L1. L1-L2: No significant bulge, protrusion or herniation. Neural foramina are widely patent.  11/02/18 X-ray Lumbar spine: compression fracture of indeterminate age of L1 and L2 vertebral bodies. Narrowing of the L1-L2 and L2-L3 intervertebral disc spaces. S/p posterior surgical fusion of L3, L4, L5, S1, Dextroscoliosis of the upper lumbar spine.   11/03/18 Ortho if the patient desires, increase Tylenol 500mg  tid.   Constipation Continue Senokot S II qhs.   Atrial fibrillation with RVR ([facility]CC) Heart rate is in control, continue Atenolol 12.5mg  bid.      Family/ staff Communication: plan of care reviewed with the patient and charge nurse.   Labs/tests ordered:  none  Time spend 40 minutes.

## 2018-11-07 NOTE — Progress Notes (Signed)
[facility] Note  11/13/2018     CHIEF COMPLAINT Patient presents for Retina Follow Up   HISTORY OF PRESENT ILLNESS: Vanessa [name] is a 85 y.o. female who presents to the clinic today for:   HPI    Retina Follow Up    Patient presents with  CRVO/BRVO.  In right eye.  This started 8 weeks ago.  Severity is moderate.  I, the attending physician,  performed the HPI with the patient and updated documentation appropriately.          Comments    Patient here for 4-5 weeks retina follow up for CRVO OD. Patient states vision about the same. Sometimes a little better. No eye pain. Eyes feel scratchy and runny. Patient hard of hearing.        Last edited by Bernarda Caffey, MD on 11/13/2018  2:25 PM. (History)    Pt states she is doing well today, she wishes to hold off on injections  Referring physician: [name], MD [address] [place],  [place] [address]  HISTORICAL INFORMATION:   Selected notes from the MEDICAL RECORD NUMBER Referred by Dr. [name] for concern of CME LEE: 01.21.20 (R. Groat) [BCVA: OD: OS:] Ocular Hx- PMH-    CURRENT MEDICATIONS: Current Outpatient Medications (Ophthalmic Drugs)  Medication Sig  . Propylene Glycol (SYSTANE BALANCE) 0.6 % SOLN Give 1 drop to both eyes once daily as needed for dryness   No current facility-administered medications for this visit.  (Ophthalmic Drugs)   Current Outpatient Medications (Other)  Medication Sig  . acetaminophen (TYLENOL) 325 MG tablet Take 650 mg by mouth every 8 (eight) hours as needed.  Marland Kitchen acetaminophen (TYLENOL) 500 MG tablet Take 500 mg by mouth 2 (two) times daily.   . Artificial Saliva (SALIVAMAX) PACK Use as directed 1 Package in the mouth or throat. mix with 11mL's of H20; swish and spit BID; may keep in bathroom; may self administer.  Marland Kitchen aspirin 81 MG chewable tablet Chew 81 mg by mouth daily.  Marland Kitchen atenolol (TENORMIN) 25 MG tablet Take 12.5 mg by mouth 2 (two)  times daily. Hold if SBP < 100 or HR < 60  . Biotin 5000 MCG TABS Take 5,000 mcg by mouth daily.  . famotidine (PEPCID) 20 MG tablet Take 20 mg by mouth daily.  . fluticasone (FLONASE) 50 MCG/ACT nasal spray Place 2 sprays into both nostrils daily.  . hydrogen peroxide 1.5 % SOLN Use 15 ml orally, swish and spit as needed for dry mouth.  Marland Kitchen ipratropium (ATROVENT) 0.06 % nasal spray Place 2 sprays into both nostrils 2 (two) times daily as needed for rhinitis.  Prudencio Burly ([place]) [place] by mouth. Take 1 in the morning.  . lactose free nutrition (BOOST PLUS) LIQD Take 237 mLs by mouth 2 (two) times daily with a meal. Per request of resident, states can't eat if takes it later   . Lidocaine 4 % PTCH Apply 1 patch topically. Remove patch at bedtime  . lidocaine-prilocaine (EMLA) cream Apply 1 application topically daily as needed.  . loratadine (CLARITIN) 10 MG tablet Take 10 mg by mouth daily as needed for allergies.  . Menthol, Topical Analgesic, (BIOFREEZE ROLL-ON) 4 % GEL Apply 1 application topically every 8 (eight) hours. Apply to LLE  . Multiple Vitamins-Minerals (MULTIVITAL) tablet Take 1 tablet by mouth daily.  . ondansetron (ZOFRAN) 4 MG tablet Take 4 mg by mouth every 6 (six)  hours as needed for nausea or vomiting.  Marland Kitchen Peppermint Oil (IBGARD) 90 MG CPCR Take 2 capsules by mouth 2 (two) times daily.   . polyethylene glycol (MIRALAX / GLYCOLAX) packet Take 17 g daily as needed by mouth (for constipation).   . pregabalin (LYRICA) 25 MG capsule Take one capsule by mouth once daily at bedtime  . pregabalin (LYRICA) 50 MG capsule Take 1 capsule (50 mg total) by mouth at bedtime.  . sennosides-docusate sodium (SENOKOT-S) 8.6-50 MG tablet Take 2 tablets by mouth daily.   . sodium phosphate (FLEET) 7-19 GM/118ML ENEM Place 1 enema rectally as needed for severe constipation. Give Fleet enema per rectum X 1 after removing hard stool or if soft stool is present in rectum.   . traMADol (ULTRAM) 50 MG tablet Take 1 tablet (50 mg total) by mouth 2 (two) times daily. (Patient taking differently: Take 50 mg by mouth 2 (two) times daily. 2 times daily and as needed)  . triamcinolone cream (KENALOG) 0.1 % Apply 1 application topically as needed. Apply to itchy areas as needed. May keep in room and self apply   No current facility-administered medications for this visit.  (Other)      REVIEW OF SYSTEMS: ROS    Positive for: Gastrointestinal, Neurological, Musculoskeletal, Endocrine, Eyes   Negative for: Constitutional, Skin, Genitourinary, HENT, Cardiovascular, Respiratory, Psychiatric, Allergic/Imm, Heme/Lymph   Last edited by Theodore Demark, COA on 11/13/2018  1:50 PM. (History)       ALLERGIES No Known Allergies  PAST MEDICAL HISTORY Past Medical History:  Diagnosis Date  . Abdominal bloating   . Atrial fibrillation ([place])   . Bruises easily   . Carotid artery occlusion    LEFT  . Dizziness   . DOE (dyspnea on exertion) 04/02/2014  . Fall at home Sept 2013, Dec. 2013  Jun 08, 2012  . GERD (gastroesophageal reflux disease) 02/11/2015  . Headache(784.0)   . Hoarseness   . Hypercholesterolemia   . Hyperglycemia 04/02/2014   Glucose 178 mg percent on 01/22/2014 04/05/14 Hgb A1c 6.6 Diet controlled.    . Hypothyroidism 04/15/2015  . Neuropathy    PERIPHERAL  . Pruritus   . Scoliosis   . Varicose veins    Past Surgical History:  Procedure Laterality Date  . ABDOMINAL HYSTERECTOMY  1954  . CHOLECYSTECTOMY  1997  . ESOPHAGOGASTRODUODENOSCOPY (EGD) WITH PROPOFOL N/A 11/25/2016   Procedure: ESOPHAGOGASTRODUODENOSCOPY (EGD) WITH PROPOFOL;  Surgeon: [name], MD;  Location: [facility];  Service: Endoscopy;  Laterality: N/A;  . [place]   correct scoliosis    FAMILY HISTORY Family History  Adopted: Yes  Problem Relation Age of Onset  . Diabetes Mother   . Heart attack Mother   . Heart disease Mother        After age 27  .  Heart attack Father   . Stroke Father   . Breast cancer Sister   . Heart disease Maternal Grandmother   . Cancer Son        adopted son. esophagus, stomach, liver  . Stomach cancer Neg Hx   . Colon cancer Neg Hx     SOCIAL HISTORY Social History   Tobacco Use  . Smoking status: Never Smoker  . Smokeless tobacco: Never Used  Substance Use Topics  . Alcohol use: No    Alcohol/week: 0.0 standard drinks  . Drug use: No         OPHTHALMIC EXAM:  Base Eye Exam  Visual Acuity (Snellen - Linear)      Right Left   Dist cc CF at 3' 20/25 -2   Dist ph cc NI    Correction: Glasses       Tonometry (Tonopen, 1:46 PM)      Right Left   Pressure 17 17       Pupils      Dark Light Shape React APD   Right 2 1 Round Minimal None   Left 2 1 Round Minimal None       Visual Fields (Counting fingers)      Left Right    Full Full       Extraocular Movement      Right Left    Full, Ortho Full, Ortho       Neuro/Psych    Oriented x3: Yes   Mood/Affect: Normal       Dilation    Both eyes: 1.0% Mydriacyl, 2.5% Phenylephrine @ 1:45 PM        Slit Lamp and Fundus Exam    Slit Lamp Exam      Right Left   Lids/Lashes Dermatochalasis - upper lid Dermatochalasis - upper lid, Meibomian gland dysfunction   Conjunctiva/Sclera White and quiet White and quiet   Cornea Arcus, 1-2+ diffuse Punctate epithelial erosions Arcus, 2+ diffuse Punctate epithelial erosions   Anterior Chamber deep and clear deep and clear   Iris Round and moderately dilated to 43mm Round and moderately dilated to 70mm   Lens PC IOL in good position with open PC PC IOL in good position with open PC   Vitreous Vitreous syneresis Vitreous syneresis       Fundus Exam      Right Left   Disc Mild pallor, sharp, superior hyperemia, vascular loops superiorly Pink and Sharp   C/D Ratio 0.4 0.3   Macula Blunted foveal reflex, RPE mottling and clumping, rare IRH, +trace cystic change - improved Flat, Blunted  foveal reflex, Drusen, RPE mottling and clumping, No heme or edema   Vessels Attenuated, tortuous, CRVO Vascular attenuation, Tortuous   Periphery Attached, choroidal nevus at 0930 equator w/ overlying drusen, minimal elavation, no orange pigment, 3DD in diam, 360 DBH - improving Attached, reticular degeneration, peripheral cystoid degeneration        Refraction    Wearing Rx      Sphere Cylinder Axis Add   Right -2.75 +2.75 010 +3.25   Left -3.75 +3.25 011 +3.25          IMAGING AND PROCEDURES  Imaging and Procedures for @TODAY @  OCT, Retina - OU - Both Eyes       Right Eye Quality was good. Central Foveal Thickness: 199. Progression has been stable. Findings include retinal drusen , no SRF, inner retinal atrophy, outer retinal atrophy, intraretinal hyper-reflective material, normal foveal contour, no IRF (Trace residual cystic changes; persistent ORA).   Left Eye Quality was good. Central Foveal Thickness: 226. Progression has been stable. Findings include normal foveal contour, no SRF, no IRF, retinal drusen , outer retinal atrophy (Mild patchy ORA).   Notes *Images captured and stored on drive  Diagnosis / Impression:  OD: CRVO with tr residual cystic changes--CME stably improved; persistent ORA non-exu ARMD OU   Clinical management:  See below  Abbreviations: NFP - Normal foveal profile. CME - cystoid macular edema. PED - pigment epithelial detachment. IRF - intraretinal fluid. SRF - subretinal fluid. EZ - ellipsoid zone. ERM - epiretinal membrane. ORA - outer retinal atrophy.  ORT - outer retinal tubulation. SRHM - subretinal hyper-reflective material                 ASSESSMENT/PLAN:    ICD-10-CM   1. Central retinal vein occlusion with macular edema of right eye  H34.8110   2. Retinal edema  H35.81 OCT, Retina - OU - Both Eyes  3. Intermediate stage nonexudative age-related macular degeneration of both eyes  H35.3132   4. Essential hypertension  I10    5. Hypertensive retinopathy of both eyes  H35.033   6. Nevus of choroid of right eye  D31.31   7. Pseudophakia of both eyes  Z96.1     1,2. CRVO with CME OD  - s/p IVA OD #1 (01.22.20), #2 (02.18.20), #3 (04.22.20),#4 (05.20.20), #5 (06.24.20), #6 (08.04.20), #7 (09.08.20)  - BCVA OD: stable at CF  - OCT shows mild persistent cystic changes, no frank CME; persistent ORA  - FA 2.19.2020 shows delayed filling time OD, no active leakage -- ?RAO component  - pt wishes to hold off on injection today -- reasonable  - F/U 3 months, sooner prn -- DFE/OCT/possible injection, optos (for nevus, see below)  3. Age related macular degeneration, non-exudative, intermediate stage OU5  - The incidence, anatomy, and pathology of dry AMD, risk of progression, and the AREDS and AREDS 2 study including smoking risks discussed with patient.  - recommend amsler grid monitoring  - OD with interval progression of central ORA on OCT  - monitor  4,5. Hypertensive retinopathy OU  - discussed importance of tight BP control  - monitor  6. Choroidal Nevus OD  - ~3DD in diameter, round, located at 0930 equator  - +drusen; no orange pigment or SRF  - minimal elevation  - baseline Optos imaging obtained 2.19.2020  - stable today  - monitor  7. Pseudophakia OU  - s/p CE/IOL OU  - beautiful surgeries, doing well  - monitor   Ophthalmic Meds Ordered this visit:  No orders of the defined types were placed in this encounter.      Return in about 3 months (around 02/13/2019) for f/u CRVO OD, DFE, OCT.  There are no Patient Instructions on file for this visit.   Explained the diagnoses, plan, and follow up with the patient and they expressed understanding.  Patient expressed understanding of the importance of proper follow up care.   Electronically signed by: Leeann Must, [place] 10.27.2020 1:25PM  This document serves as a record of services personally performed by Gardiner Sleeper, MD, PhD. It was  created on their behalf by Ernest Mallick, OA, an ophthalmic assistant. The creation of this record is the provider's dictation and/or activities during the visit.    Electronically signed by: Ernest Mallick, OA 11.02.2020 10:55 PM   Gardiner Sleeper, M.D., Ph.D. Diseases & Surgery of the Retina and Vitreous [facility]  I have reviewed the above documentation for accuracy and completeness, and I agree with the above. Gardiner Sleeper, M.D., Ph.D. 11/13/18 10:55 PM    Abbreviations: M myopia (nearsighted); A astigmatism; H hyperopia (farsighted); P presbyopia; Mrx spectacle prescription;  CTL contact lenses; OD right eye; OS left eye; OU both eyes  XT exotropia; ET esotropia; PEK punctate epithelial keratitis; PEE punctate epithelial erosions; DES dry eye syndrome; MGD meibomian gland dysfunction; ATs artificial tears; PFAT's preservative free artificial tears; [place] nuclear sclerotic cataract; PSC posterior subcapsular cataract; ERM epi-retinal membrane; PVD posterior vitreous detachment; RD retinal detachment; DM diabetes mellitus;  DR diabetic retinopathy; NPDR non-proliferative diabetic retinopathy; PDR proliferative diabetic retinopathy; CSME clinically significant macular edema; DME diabetic macular edema; dbh dot blot hemorrhages; CWS cotton wool spot; POAG primary open angle glaucoma; C/D cup-to-disc ratio; HVF humphrey visual field; GVF goldmann visual field; OCT optical coherence tomography; IOP intraocular pressure; BRVO Branch retinal vein occlusion; CRVO central retinal vein occlusion; CRAO central retinal artery occlusion; BRAO branch retinal artery occlusion; RT retinal tear; SB scleral buckle; PPV pars plana vitrectomy; VH Vitreous hemorrhage; PRP panretinal laser photocoagulation; IVK intravitreal kenalog; VMT vitreomacular traction; MH Macular hole;  NVD neovascularization of the disc; NVE neovascularization elsewhere; AREDS age related eye disease study; ARMD age  related macular degeneration; POAG primary open angle glaucoma; EBMD epithelial/anterior basement membrane dystrophy; ACIOL anterior chamber intraocular lens; IOL intraocular lens; PCIOL posterior chamber intraocular lens; Phaco/IOL phacoemulsification with intraocular lens placement; [place] photorefractive keratectomy; LASIK laser assisted in situ keratomileusis; HTN hypertension; DM diabetes mellitus; COPD chronic obstructive pulmonary disease

## 2018-11-13 ENCOUNTER — Ambulatory Visit (INDEPENDENT_AMBULATORY_CARE_PROVIDER_SITE_OTHER): Payer: Medicare Other | Admitting: Ophthalmology

## 2018-11-13 ENCOUNTER — Encounter (INDEPENDENT_AMBULATORY_CARE_PROVIDER_SITE_OTHER): Payer: Self-pay | Admitting: Ophthalmology

## 2018-11-13 DIAGNOSIS — H3581 Retinal edema: Secondary | ICD-10-CM

## 2018-11-13 DIAGNOSIS — H34811 Central retinal vein occlusion, right eye, with macular edema: Secondary | ICD-10-CM

## 2018-11-13 DIAGNOSIS — D3131 Benign neoplasm of right choroid: Secondary | ICD-10-CM

## 2018-11-13 DIAGNOSIS — H35033 Hypertensive retinopathy, bilateral: Secondary | ICD-10-CM

## 2018-11-13 DIAGNOSIS — I10 Essential (primary) hypertension: Secondary | ICD-10-CM

## 2018-11-13 DIAGNOSIS — H353132 Nonexudative age-related macular degeneration, bilateral, intermediate dry stage: Secondary | ICD-10-CM

## 2018-11-13 DIAGNOSIS — Z961 Presence of intraocular lens: Secondary | ICD-10-CM

## 2018-11-17 ENCOUNTER — Other Ambulatory Visit: Payer: Self-pay | Admitting: *Deleted

## 2018-11-17 MED ORDER — PREGABALIN 25 MG PO CAPS: ORAL_CAPSULE | ORAL | 0 refills | Status: DC

## 2018-11-17 MED ORDER — PREGABALIN 50 MG PO CAPS: 50.0000 mg | ORAL_CAPSULE | Freq: Every day | ORAL | 0 refills | Status: DC

## 2018-11-17 NOTE — Telephone Encounter (Signed)
Received fax from [facility] Pended Rx and sent to Dr. Gupta for approval.  

## 2018-11-21 ENCOUNTER — Other Ambulatory Visit: Payer: Self-pay | Admitting: *Deleted

## 2018-11-21 MED ORDER — TRAMADOL HCL 50 MG PO TABS: 50.0000 mg | ORAL_TABLET | Freq: Two times a day (BID) | ORAL | 0 refills | Status: DC

## 2018-11-21 NOTE — Telephone Encounter (Signed)
Received fax from [facility] °Pended Rx and sent to [employer] for approval.  °

## 2018-12-06 DIAGNOSIS — M545 Low back pain: Secondary | ICD-10-CM | POA: Diagnosis not present

## 2018-12-06 DIAGNOSIS — Z981 Arthrodesis status: Secondary | ICD-10-CM | POA: Diagnosis not present

## 2018-12-06 DIAGNOSIS — R2681 Unsteadiness on feet: Secondary | ICD-10-CM | POA: Diagnosis not present

## 2018-12-06 DIAGNOSIS — M6281 Muscle weakness (generalized): Secondary | ICD-10-CM | POA: Diagnosis not present

## 2018-12-10 NOTE — Progress Notes (Deleted)
PATIENT: Vanessa [name] DOB: May 12, 1920  REASON FOR VISIT: follow up HISTORY FROM: patient  HISTORY OF PRESENT ILLNESS: Today 12/10/18  Vanessa [name] is a 85 year old female with history of peripheral neuropathy.  She lives at [facility].  She is currently taking Lyrica 25 mg in the morning, 50 mg in the evening.  HISTORY 06/08/2018 Dr. Jannifer Franklin: Vanessa [name] is a 85 year old right-handed white female with a history of a peripheral neuropathy.  The patient reports numbness in her feet and cold sensations in the feet, she has lancinating pains going up the legs at times, left greater than right.  She notes that the pain is worse in the evening hours, if she gets up on her feet the pain improves but the pain worsens when she is off of her feet.  She is not very active during the day, but she denies any falls.  She uses a walker for ambulation.  She lives at [facility]  She currently is on Lyrica 25 mg twice during the day, she is not getting full benefit with this medication.  REVIEW OF SYSTEMS: Out of a complete 14 system review of symptoms, the patient complains only of the following symptoms, and all other reviewed systems are negative.  ALLERGIES: No Known Allergies  HOME MEDICATIONS: Outpatient Medications Prior to Visit  Medication Sig Dispense Refill  . acetaminophen (TYLENOL) 325 MG tablet Take 650 mg by mouth every 8 (eight) hours as needed.    Marland Kitchen acetaminophen (TYLENOL) 500 MG tablet Take 500 mg by mouth 2 (two) times daily.     . Artificial Saliva (SALIVAMAX) PACK Use as directed 1 Package in the mouth or throat. mix with 52mL's of H20; swish and spit BID; may keep in bathroom; may self administer.    Marland Kitchen aspirin 81 MG chewable tablet Chew 81 mg by mouth daily.    Marland Kitchen atenolol (TENORMIN) 25 MG tablet Take 12.5 mg by mouth 2 (two) times daily. Hold if SBP < 100 or HR < 60    . Biotin 5000 MCG TABS Take 5,000 mcg by mouth daily.    . famotidine (PEPCID) 20 MG tablet  Take 20 mg by mouth daily.    . fluticasone (FLONASE) 50 MCG/ACT nasal spray Place 2 sprays into both nostrils daily.    . hydrogen peroxide 1.5 % SOLN Use 15 ml orally, swish and spit as needed for dry mouth.    Marland Kitchen ipratropium (ATROVENT) 0.06 % nasal spray Place 2 sprays into both nostrils 2 (two) times daily as needed for rhinitis.    Prudencio Burly ([place]) [place] by mouth. Take 1 in the morning.    . lactose free nutrition (BOOST PLUS) LIQD Take 237 mLs by mouth 2 (two) times daily with a meal. Per request of resident, states can't eat if takes it later     . Lidocaine 4 % PTCH Apply 1 patch topically. Remove patch at bedtime    . lidocaine-prilocaine (EMLA) cream Apply 1 application topically daily as needed.    . loratadine (CLARITIN) 10 MG tablet Take 10 mg by mouth daily as needed for allergies.    . Menthol, Topical Analgesic, (BIOFREEZE ROLL-ON) 4 % GEL Apply 1 application topically every 8 (eight) hours. Apply to LLE    . Multiple Vitamins-Minerals (MULTIVITAL) tablet Take 1 tablet by mouth daily.    . ondansetron (ZOFRAN) 4 MG tablet Take 4 mg by mouth every 6 (six) hours as needed for nausea or  vomiting.    Marland Kitchen Peppermint Oil (IBGARD) 90 MG CPCR Take 2 capsules by mouth 2 (two) times daily.     . polyethylene glycol (MIRALAX / GLYCOLAX) packet Take 17 g daily as needed by mouth (for constipation).     . pregabalin (LYRICA) 25 MG capsule Take one capsule by mouth once daily every morning 30 capsule 0  . pregabalin (LYRICA) 50 MG capsule Take 1 capsule (50 mg total) by mouth at bedtime. 30 capsule 0  . Propylene Glycol (SYSTANE BALANCE) 0.6 % SOLN Give 1 drop to both eyes once daily as needed for dryness    . sennosides-docusate sodium (SENOKOT-S) 8.6-50 MG tablet Take 2 tablets by mouth daily.     . sodium phosphate (FLEET) 7-19 GM/118ML ENEM Place 1 enema rectally as needed for severe constipation. Give Fleet enema per rectum X 1 after removing hard stool or  if soft stool is present in rectum.    . traMADol (ULTRAM) 50 MG tablet Take 1 tablet (50 mg total) by mouth 2 (two) times daily. 60 tablet 0  . triamcinolone cream (KENALOG) 0.1 % Apply 1 application topically as needed. Apply to itchy areas as needed. May keep in room and self apply     No facility-administered medications prior to visit.     PAST MEDICAL HISTORY: Past Medical History:  Diagnosis Date  . Abdominal bloating   . Atrial fibrillation ([place])   . Bruises easily   . Carotid artery occlusion    LEFT  . Dizziness   . DOE (dyspnea on exertion) 04/02/2014  . Fall at home Sept 2013, Dec. 2013  Jun 08, 2012  . GERD (gastroesophageal reflux disease) 02/11/2015  . Headache(784.0)   . Hoarseness   . Hypercholesterolemia   . Hyperglycemia 04/02/2014   Glucose 178 mg percent on 01/22/2014 04/05/14 Hgb A1c 6.6 Diet controlled.    . Hypothyroidism 04/15/2015  . Neuropathy    PERIPHERAL  . Pruritus   . Scoliosis   . Varicose veins     PAST SURGICAL HISTORY: Past Surgical History:  Procedure Laterality Date  . ABDOMINAL HYSTERECTOMY  1954  . CHOLECYSTECTOMY  1997  . ESOPHAGOGASTRODUODENOSCOPY (EGD) WITH PROPOFOL N/A 11/25/2016   Procedure: ESOPHAGOGASTRODUODENOSCOPY (EGD) WITH PROPOFOL;  Surgeon: [name], MD;  Location: [facility];  Service: Endoscopy;  Laterality: N/A;  . SPINE SURGERY  1997   correct scoliosis    FAMILY HISTORY: Family History  Adopted: Yes  Problem Relation Age of Onset  . Diabetes Mother   . Heart attack Mother   . Heart disease Mother        After age 53  . Heart attack Father   . Stroke Father   . Breast cancer Sister   . Heart disease Maternal Grandmother   . Cancer Son        adopted son. esophagus, stomach, liver  . Stomach cancer Neg Hx   . Colon cancer Neg Hx     SOCIAL HISTORY: Social History   Socioeconomic History  . Marital status: Widowed    Spouse name: Not on file  . Number of children: 2  . Years of education:  12+  . Highest education level: Not on file  Occupational History  . 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 Financial resource strain: Not hard at all  . Food insecurity    Worry: Never true    Inability: Never true  . Transportation needs  Medical: No    Non-medical: No  Tobacco Use  . Smoking status: Never Smoker  . Smokeless tobacco: Never Used  Substance and Sexual Activity  . Alcohol use: No    Alcohol/week: 0.0 standard drinks  . Drug use: No  . Sexual activity: Never    Birth control/protection: None  Lifestyle  . Physical activity    Days per week: 0 days    Minutes per session: 0 min  . Stress: Only a little  Relationships  . Social connections    Talks on phone: More than three times a week    Gets together: More than three times a week    Attends religious service: Never    Active member of club or organization: No    Attends meetings of clubs or organizations: Never    Relationship status: Widowed  . Intimate partner violence    Fear of current or ex partner: No    Emotionally abused: No    Physically abused: No    Forced sexual activity: No  Other Topics Concern  . Not on file  Social History Narrative   Patient lives at [facility] 03/05/[facility]   Patient is a widowed.    Patient is retired.    Patient has no children but adopted twin sons.    Patient has a college degree.    Never smoked   Alcohol none   Exercise with therapy   Walks with cane   POA       Patient only drinks de-caf drinks.   Patient is right handed.      PHYSICAL EXAM  There were no vitals filed for this visit. There is no height or weight on file to calculate BMI.  Generalized: Well developed, in no acute distress   Neurological examination  Mentation: Alert oriented to time, place, history taking. Follows all commands speech and language fluent Cranial nerve II-XII: Pupils were equal round reactive to  light. Extraocular movements were full, visual field were full on confrontational test. Facial sensation and strength were normal. Uvula tongue midline. Head turning and shoulder shrug  were normal and symmetric. Motor: The motor testing reveals 5 over 5 strength of all 4 extremities. Good symmetric motor tone is noted throughout.  Sensory: Sensory testing is intact to soft touch on all 4 extremities. No evidence of extinction is noted.  Coordination: Cerebellar testing reveals good finger-nose-finger and heel-to-shin bilaterally.  Gait and station: Gait is normal. Tandem gait is normal. Romberg is negative. No drift is seen.  Reflexes: Deep tendon reflexes are symmetric and normal bilaterally.   DIAGNOSTIC DATA (LABS, IMAGING, TESTING) - I reviewed patient records, labs, notes, testing and imaging myself where available.  Lab Results  Component Value Date   WBC 8.1 03/30/2018   HGB 13.2 04/06/2018   HCT 39 04/06/2018   MCV 102.6 (H) 03/30/2018   PLT 200 04/06/2018      Component Value Date/Time   NA 138 04/06/2018   NA 140 03/07/2017   K 4.6 04/06/2018   K 4.9 03/07/2017   CL 100 03/30/2018 1252   CL 103 08/04/2017   CO2 26 03/30/2018 1252   CO2 29 08/04/2017   GLUCOSE 109 (H) 03/30/2018 1252   BUN 17 04/06/2018   CREATININE 0.9 04/06/2018   CREATININE 0.93 03/30/2018 1252   CREATININE 0.77 03/07/2017   CALCIUM 9.7 03/30/2018 1252   CALCIUM 9.3 08/04/2017   PROT 6.2 (L) 03/30/2018 1252   PROT 5.9 08/04/2017  ALBUMIN 3.6 03/30/2018 1252   ALBUMIN 3.3 03/07/2017   AST 30 03/30/2018 1252   AST 22 03/07/2017   ALT 16 03/30/2018 1252   ALT 13 03/07/2017   ALKPHOS 77 03/30/2018 1252   ALKPHOS 71 03/07/2017   BILITOT 0.6 03/30/2018 1252   BILITOT 0.6 03/07/2017   GFRNONAA 52 (L) 03/30/2018 1252   GFRNONAA 66 03/07/2017   GFRAA >60 03/30/2018 1252   Lab Results  Component Value Date   CHOL 187 11/03/2017   HDL 84 (A) 11/03/2017   LDLCALC 80 11/03/2017   TRIG 136  11/03/2017   CHOLHDL 2.1 07/29/2016   Lab Results  Component Value Date   HGBA1C 6.4 07/07/2015   Lab Results  Component Value Date   [id number] 04/06/2018   Lab Results  Component Value Date   TSH 3.44 08/04/2017      ASSESSMENT AND PLAN 85 y.o. year old female  has a past medical history of Abdominal bloating, Atrial fibrillation ([place]), Bruises easily, Carotid artery occlusion, Dizziness, DOE (dyspnea on exertion) (04/02/2014), Fall at home (Sept 2013, Dec. 2013  Jun 08, 2012), GERD (gastroesophageal reflux disease) (02/11/2015), Headache(784.0), Hoarseness, Hypercholesterolemia, Hyperglycemia (04/02/2014), Hypothyroidism (04/15/2015), Neuropathy, Pruritus, Scoliosis, and Varicose veins. here with ***   I spent 15 minutes with the patient. 50% of this time was spent   Butler Denmark, [place], DNP 12/10/2018, 8:38 PM [facility] [address] [place], [place] [address] [phone]

## 2018-12-11 ENCOUNTER — Ambulatory Visit: Payer: Medicare Other | Admitting: Neurology

## 2018-12-11 ENCOUNTER — Encounter: Payer: Self-pay | Admitting: Neurology

## 2018-12-11 DIAGNOSIS — R2681 Unsteadiness on feet: Secondary | ICD-10-CM | POA: Diagnosis not present

## 2018-12-11 DIAGNOSIS — M6281 Muscle weakness (generalized): Secondary | ICD-10-CM | POA: Diagnosis not present

## 2018-12-11 DIAGNOSIS — M545 Low back pain: Secondary | ICD-10-CM | POA: Diagnosis not present

## 2018-12-11 DIAGNOSIS — Z981 Arthrodesis status: Secondary | ICD-10-CM | POA: Diagnosis not present

## 2018-12-12 DIAGNOSIS — Z9181 History of falling: Secondary | ICD-10-CM | POA: Diagnosis not present

## 2018-12-12 DIAGNOSIS — M545 Low back pain: Secondary | ICD-10-CM | POA: Diagnosis not present

## 2018-12-12 DIAGNOSIS — R2681 Unsteadiness on feet: Secondary | ICD-10-CM | POA: Diagnosis not present

## 2018-12-12 DIAGNOSIS — R41841 Cognitive communication deficit: Secondary | ICD-10-CM | POA: Diagnosis not present

## 2018-12-12 DIAGNOSIS — M6281 Muscle weakness (generalized): Secondary | ICD-10-CM | POA: Diagnosis not present

## 2018-12-13 DIAGNOSIS — R2681 Unsteadiness on feet: Secondary | ICD-10-CM | POA: Diagnosis not present

## 2018-12-13 DIAGNOSIS — M545 Low back pain: Secondary | ICD-10-CM | POA: Diagnosis not present

## 2018-12-13 DIAGNOSIS — Z9181 History of falling: Secondary | ICD-10-CM | POA: Diagnosis not present

## 2018-12-13 DIAGNOSIS — M6281 Muscle weakness (generalized): Secondary | ICD-10-CM | POA: Diagnosis not present

## 2018-12-13 DIAGNOSIS — R41841 Cognitive communication deficit: Secondary | ICD-10-CM | POA: Diagnosis not present

## 2018-12-14 DIAGNOSIS — R41841 Cognitive communication deficit: Secondary | ICD-10-CM | POA: Diagnosis not present

## 2018-12-14 DIAGNOSIS — M545 Low back pain: Secondary | ICD-10-CM | POA: Diagnosis not present

## 2018-12-14 DIAGNOSIS — Z9181 History of falling: Secondary | ICD-10-CM | POA: Diagnosis not present

## 2018-12-14 DIAGNOSIS — M6281 Muscle weakness (generalized): Secondary | ICD-10-CM | POA: Diagnosis not present

## 2018-12-14 DIAGNOSIS — R2681 Unsteadiness on feet: Secondary | ICD-10-CM | POA: Diagnosis not present

## 2018-12-15 ENCOUNTER — Non-Acute Institutional Stay: Payer: Medicare Other | Admitting: Nurse Practitioner

## 2018-12-15 ENCOUNTER — Encounter: Payer: Self-pay | Admitting: Nurse Practitioner

## 2018-12-15 DIAGNOSIS — K5900 Constipation, unspecified: Secondary | ICD-10-CM

## 2018-12-15 DIAGNOSIS — K219 Gastro-esophageal reflux disease without esophagitis: Secondary | ICD-10-CM | POA: Diagnosis not present

## 2018-12-15 DIAGNOSIS — K117 Disturbances of salivary secretion: Secondary | ICD-10-CM | POA: Diagnosis not present

## 2018-12-15 DIAGNOSIS — I4891 Unspecified atrial fibrillation: Secondary | ICD-10-CM

## 2018-12-15 DIAGNOSIS — G629 Polyneuropathy, unspecified: Secondary | ICD-10-CM | POA: Diagnosis not present

## 2018-12-15 DIAGNOSIS — M545 Low back pain: Secondary | ICD-10-CM | POA: Diagnosis not present

## 2018-12-15 DIAGNOSIS — R41841 Cognitive communication deficit: Secondary | ICD-10-CM | POA: Diagnosis not present

## 2018-12-15 DIAGNOSIS — Z9181 History of falling: Secondary | ICD-10-CM | POA: Diagnosis not present

## 2018-12-15 DIAGNOSIS — M6281 Muscle weakness (generalized): Secondary | ICD-10-CM | POA: Diagnosis not present

## 2018-12-15 DIAGNOSIS — K121 Other forms of stomatitis: Secondary | ICD-10-CM

## 2018-12-15 DIAGNOSIS — R2681 Unsteadiness on feet: Secondary | ICD-10-CM | POA: Diagnosis not present

## 2018-12-15 NOTE — Progress Notes (Signed)
Location:   [place] Room Number: 03/05/2014 Place of Service:  ALF (13) Provider:  Kenyanna Grzesiak NP  [name], MD  Patient Care Team: [name], MD as PCP - General (Internal Medicine) Angelia Mould, MD as Attending Physician (Vascular Surgery) [name], MD (Inactive) as Attending Physician (Obstetrics and Gynecology) [name], MD (Orthopedic Surgery) [name], MD (Ophthalmology) [name], MD as Consulting Physician (Urology) [name], MD as Consulting Physician (Cardiology) [name], PA-C as Physician Assistant (Otolaryngology) [name], MD as Consulting Physician (Otolaryngology) [name], MD as Consulting Physician (Dermatology) [name], MD as Consulting Physician (Physical Medicine and Rehabilitation) [name], MD as Attending Physician (Gastroenterology) Camri Molloy X, NP as Nurse Practitioner (Internal Medicine) Kathrynn Ducking, MD as Consulting Physician (Neurology) Debara Pickett Nadean Corwin, MD as Consulting Physician (Cardiology) Druscilla Brownie, MD as Consulting Physician (Dermatology) [name], [name], NP as Nurse Practitioner (Family Medicine)  Extended Emergency Contact Information Primary Emergency Contact: [name] of [place] Phone: [phone] Mobile Phone: [phone] Relation: Son Secondary Emergency Contact: [name] Mobile Phone: [phone] Relation: Friend  Code Status:  Full Code Goals of care: Advanced Directive information Advanced Directives 10/27/2018  Does Patient Have a Medical Advance Directive? No  Type of Advance Directive -  Does patient want to make changes to medical advance directive? No - Patient declined  Copy of [place] in Chart? -  Would patient like information on creating a medical advance directive? -     Chief Complaint  Patient presents with  . Acute Visit    Sore throat for 2 weeks     HPI:  Pt is a 85 y.o. female seen today for an acute visit for c/o sore throat for 2 weeks, dry mouth-not new-Biotin daily,  tongue is beefy red/irritated. She denied fatigue, muscle aches/pain, headache, cough, SOB, chest pain/pressure, palpitation, or change of smell or taste. Chronic lower back pain/neuropathy, on Tylenol 500mg  bid,  Tramadol 50mg  bid, Lyrica 50mg  qd, 25mg  qd.  Constipation, stable, on Senokot S II qd, prn MiraLax. GERD stable, on Famotidine 20mg  qd, prn Zofran. Heart rate is controlled on Atenolol 12.5mg  bid.    Past Medical History:  Diagnosis Date  . Abdominal bloating   . Atrial fibrillation ([place])   . Bruises easily   . Carotid artery occlusion    LEFT  . Dizziness   . DOE (dyspnea on exertion) 04/02/2014  . Fall at home Sept 2013, Dec. 2013  Jun 08, 2012  . GERD (gastroesophageal reflux disease) 02/11/2015  . Headache(784.0)   . Hoarseness   . Hypercholesterolemia   . Hyperglycemia 04/02/2014   Glucose 178 mg percent on 01/22/2014 04/05/14 Hgb A1c 6.6 Diet controlled.    . Hypothyroidism 04/15/2015  . Neuropathy    PERIPHERAL  . Pruritus   . Scoliosis   . Varicose veins    Past Surgical History:  Procedure Laterality Date  . ABDOMINAL HYSTERECTOMY  1954  . CHOLECYSTECTOMY  1997  . ESOPHAGOGASTRODUODENOSCOPY (EGD) WITH PROPOFOL N/A 11/25/2016   Procedure: ESOPHAGOGASTRODUODENOSCOPY (EGD) WITH PROPOFOL;  Surgeon: [name], MD;  Location: [facility];  Service: Endoscopy;  Laterality: N/A;  . [place]   correct scoliosis    No Known Allergies  Allergies as of 12/15/2018   No Known Allergies     Medication List       Accurate as of December 15, 2018 11:59 PM. If you have any questions,  ask your nurse or doctor.        STOP taking these medications   Lidocaine 4 % Ptch Stopped by: Shaletta Hinostroza X Loucile Posner, NP     TAKE these medications   acetaminophen 500 MG tablet Commonly known as: TYLENOL Take 500 mg by mouth 2 (two) times daily.    acetaminophen 325 MG tablet Commonly known as: TYLENOL Take 650 mg by mouth every 8 (eight) hours as needed.   aspirin 81 MG chewable tablet Chew 81 mg by mouth daily.   atenolol 25 MG tablet Commonly known as: TENORMIN Take 12.5 mg by mouth 2 (two) times daily. Hold if SBP < 100 or HR < 60   Biofreeze Roll-On 4 % Gel Generic drug: Menthol (Topical Analgesic) Apply 1 application topically every 8 (eight) hours. Apply to LLE   Biotin 5000 MCG Tabs Take 5,000 mcg by mouth daily.   famotidine 20 MG tablet Commonly known as: PEPCID Take 20 mg by mouth daily.   fluticasone 50 MCG/ACT nasal spray Commonly known as: FLONASE Place 2 sprays into both nostrils daily.   hydrogen peroxide 1.5 % Soln Use 15 ml orally, swish and spit as needed for dry mouth.   IBgard 90 MG Cpcr Generic drug: Peppermint Oil Take 2 capsules by mouth 2 (two) times daily.   ipratropium 0.06 % nasal spray Commonly known as: ATROVENT Place 2 sprays into both nostrils 2 (two) times daily as needed for rhinitis.   lactose free nutrition Liqd Take 237 mLs by mouth 2 (two) times daily with a meal. Per request of resident, states can't eat if takes it later   lidocaine-prilocaine cream Commonly known as: EMLA Apply 1 application topically daily as needed.   loratadine 10 MG tablet Commonly known as: CLARITIN Take 10 mg by mouth daily as needed for allergies.   Multivital tablet Take 1 tablet by mouth daily.   ondansetron 4 MG tablet Commonly known as: ZOFRAN Take 4 mg by mouth every 6 (six) hours as needed for nausea or vomiting.   polyethylene glycol 17 g packet Commonly known as: MIRALAX / GLYCOLAX Take 17 g daily as needed by mouth (for constipation).   pregabalin 50 MG capsule Commonly known as: LYRICA Take 1 capsule (50 mg total) by mouth at bedtime.   pregabalin 25 MG capsule Commonly known as: Lyrica Take one capsule by mouth once daily every morning   SalivaMAX Pack Use as directed  1 Package in the mouth or throat. mix with 78mL's of H20; swish and spit BID; may keep in bathroom; may self administer.   sennosides-docusate sodium 8.6-50 MG tablet Commonly known as: SENOKOT-S Take 2 tablets by mouth daily.   sodium phosphate 7-19 GM/118ML Enem Place 1 enema rectally as needed for severe constipation. Give Fleet enema per rectum X 1 after removing hard stool or if soft stool is present in rectum.   Systane Balance 0.6 % Soln Generic drug: Propylene Glycol Give 1 drop to both eyes once daily as needed for dryness   traMADol 50 MG tablet Commonly known as: ULTRAM Take 1 tablet (50 mg total) by mouth 2 (two) times daily.   triamcinolone cream 0.1 % Commonly known as: KENALOG Apply 1 application topically as needed. Apply to itchy areas as needed. May keep in room and self apply   [facility] by mouth. Take 1 in the morning.       Review of Systems  Constitutional: Negative for activity change, appetite change, chills, diaphoresis,  fatigue and fever.  HENT: Positive for hearing loss and sore throat. Negative for congestion, dental problem, facial swelling, mouth sores, postnasal drip, rhinorrhea, sinus pressure, sinus pain, trouble swallowing and voice change.        C/o dry mouth  Eyes: Negative for visual disturbance.  Respiratory: Negative for cough, shortness of breath and wheezing.   Cardiovascular: Negative for chest pain, palpitations and leg swelling.  Gastrointestinal: Negative for abdominal distention, abdominal pain, constipation, diarrhea, nausea and vomiting.  Genitourinary: Negative for difficulty urinating, dysuria and urgency.  Musculoskeletal: Positive for arthralgias, back pain and gait problem.  Neurological: Negative for dizziness, speech difficulty, weakness and headaches.  Psychiatric/Behavioral: Negative for agitation, behavioral problems, hallucinations and sleep disturbance. The patient is not nervous/anxious.      Immunization History  Administered Date(s) Administered  . Influenza Split 10/20/2011, 11/03/2012  . Influenza,inj,Quad PF,6+ Mos 09/29/2012, 11/09/2013  . Influenza-Unspecified 10/17/2014, 10/23/2015, 10/20/2016, 10/17/2017  . PPD Test 08/21/2013, 03/04/2014  . Pneumococcal Conjugate-13 08/21/2013  . Pneumococcal Polysaccharide-23 09/08/2016  . Tdap 05/20/2008  . Zoster 11/01/2012   Pertinent  Health Maintenance Due  Topic Date Due  . INFLUENZA VACCINE  Completed  . DEXA SCAN  Completed  . PNA vac Low Risk Adult  Completed   Fall Risk  11/04/2017 09/05/2017 09/22/2016 08/25/2016 05/31/2016  Falls in the past year? Yes Yes Yes Yes No  Number falls in past yr: 2 or more 2 or more 1 2 or more -  Comment - - - - -  Injury with Fall? Yes Yes Yes Yes -  Comment - - fx 3 ribs - -  Risk Factor Category  - - - - -  Risk for fall due to : - - Impaired mobility;Impaired balance/gait - -  Risk for fall due to: Comment - - - - -   Functional Status Survey:    Vitals:   12/15/18 1407  BP: 136/68  Pulse: 74  Resp: 18  Temp: 98 F (36.7 C)  SpO2: 94%  Weight: 103 lb (46.7 kg)  Height: 5\' 2"  (1.575 m)   Body mass index is 18.84 kg/m. Physical Exam Vitals signs and nursing note reviewed.  Constitutional:      General: She is not in acute distress.    Appearance: Normal appearance. She is not ill-appearing, toxic-appearing or diaphoretic.  HENT:     Head: Normocephalic and atraumatic.     Nose: Nose normal. No congestion or rhinorrhea.     Mouth/Throat:     Mouth: Mucous membranes are moist.     Comments: Beefy red tongue, no lesion, no erythema or drainage in throat.  Eyes:     Extraocular Movements: Extraocular movements intact.     Conjunctiva/sclera: Conjunctivae normal.     Pupils: Pupils are equal, round, and reactive to light.  Neck:     Musculoskeletal: Normal range of motion and neck supple.  Cardiovascular:     Rate and Rhythm: Normal rate. Rhythm irregular.      Heart sounds: No murmur.  Pulmonary:     Breath sounds: No wheezing, rhonchi or rales.  Musculoskeletal:     Right lower leg: No edema.     Left lower leg: No edema.  Skin:    General: Skin is warm and dry.  Neurological:     General: No focal deficit present.     Mental Status: She is alert and oriented to person, place, and time. Mental status is at baseline.     Labs reviewed:  Recent Labs    03/06/18 03/30/18 1252 04/06/18  NA  --  134* 138  K  --  4.3 4.6  CL  --  100  --   CO2  --  26  --   GLUCOSE  --  109*  --   BUN 17 16 17   CREATININE 0.8 0.93 0.9  CALCIUM  --  9.7  --    Recent Labs    03/06/18 03/30/18 1252  AST 21 30  ALT 12 16  ALKPHOS  --  77  BILITOT  --  0.6  PROT  --  6.2*  ALBUMIN  --  3.6   Recent Labs    03/06/18 03/30/18 1252 04/06/18  WBC  --  8.1  --   HGB 12.4 13.2 13.2  HCT 36 40.1 39  MCV  --  102.6*  --   PLT 217 196 200   Lab Results  Component Value Date   TSH 3.44 08/04/2017   Lab Results  Component Value Date   HGBA1C 6.4 07/07/2015   Lab Results  Component Value Date   CHOL 187 11/03/2017   HDL 84 (A) 11/03/2017   LDLCALC 80 11/03/2017   TRIG 136 11/03/2017   CHOLHDL 2.1 07/29/2016    Significant Diagnostic Results in last 30 days:  No results found.  Assessment/Plan Oral inflammation Beefy red tongue, c/o sore throat with no s/s of erythema/swelling/purulent drainage. Dry mouth and GERD may be contributory. Try Magic mouth wash 15ml s/s ac hs x 2 weeks. Observe  Xerostomia Dry mouth, continue Biotin.   GERD (gastroesophageal reflux disease) Contributing to sore throat? Continue Famotidine.   Neuropathy Continue Tylenol 500mg  bid,  Tramadol 50mg  bid, Lyrica 50mg  qd, 25mg  qd.  Constipation Stable, continue Senokot, MiraLax.   Atrial fibrillation with RVR (HCC) Heart rate is in control, continue Atenolol.     Family/ staff Communication: plan of care reviewed with the patient and charge nurse.    Labs/tests ordered:  none  Time spend 40 minutes.

## 2018-12-15 NOTE — Assessment & Plan Note (Signed)
Dry mouth, continue Biotin.

## 2018-12-15 NOTE — Assessment & Plan Note (Signed)
Contributing to sore throat? Continue Famotidine.

## 2018-12-15 NOTE — Assessment & Plan Note (Signed)
Heart rate is in control, continue Atenolol.  

## 2018-12-15 NOTE — Assessment & Plan Note (Signed)
Continue Tylenol 500mg  bid,  Tramadol 50mg  bid, Lyrica 50mg  qd, 25mg  qd.

## 2018-12-15 NOTE — Assessment & Plan Note (Signed)
Stable, continue Senokot, MiraLax.  

## 2018-12-15 NOTE — Assessment & Plan Note (Signed)
Beefy red tongue, c/o sore throat with no s/s of erythema/swelling/purulent drainage. Dry mouth and GERD may be contributory. Try Magic mouth wash 98ml s/s ac hs x 2 weeks. Observe

## 2018-12-18 ENCOUNTER — Other Ambulatory Visit: Payer: Self-pay | Admitting: *Deleted

## 2018-12-18 MED ORDER — PREGABALIN 25 MG PO CAPS: ORAL_CAPSULE | ORAL | 0 refills | Status: DC

## 2018-12-18 NOTE — Telephone Encounter (Signed)
[place] Requested

## 2018-12-19 DIAGNOSIS — M545 Low back pain: Secondary | ICD-10-CM | POA: Diagnosis not present

## 2018-12-19 DIAGNOSIS — Z9181 History of falling: Secondary | ICD-10-CM | POA: Diagnosis not present

## 2018-12-19 DIAGNOSIS — R41841 Cognitive communication deficit: Secondary | ICD-10-CM | POA: Diagnosis not present

## 2018-12-19 DIAGNOSIS — M6281 Muscle weakness (generalized): Secondary | ICD-10-CM | POA: Diagnosis not present

## 2018-12-19 DIAGNOSIS — R2681 Unsteadiness on feet: Secondary | ICD-10-CM | POA: Diagnosis not present

## 2018-12-20 DIAGNOSIS — M6281 Muscle weakness (generalized): Secondary | ICD-10-CM | POA: Diagnosis not present

## 2018-12-20 DIAGNOSIS — R41841 Cognitive communication deficit: Secondary | ICD-10-CM | POA: Diagnosis not present

## 2018-12-20 DIAGNOSIS — R2681 Unsteadiness on feet: Secondary | ICD-10-CM | POA: Diagnosis not present

## 2018-12-20 DIAGNOSIS — Z9181 History of falling: Secondary | ICD-10-CM | POA: Diagnosis not present

## 2018-12-20 DIAGNOSIS — M545 Low back pain: Secondary | ICD-10-CM | POA: Diagnosis not present

## 2018-12-21 ENCOUNTER — Other Ambulatory Visit: Payer: Self-pay | Admitting: *Deleted

## 2018-12-21 DIAGNOSIS — Z9181 History of falling: Secondary | ICD-10-CM | POA: Diagnosis not present

## 2018-12-21 DIAGNOSIS — M6281 Muscle weakness (generalized): Secondary | ICD-10-CM | POA: Diagnosis not present

## 2018-12-21 DIAGNOSIS — M545 Low back pain: Secondary | ICD-10-CM | POA: Diagnosis not present

## 2018-12-21 DIAGNOSIS — R41841 Cognitive communication deficit: Secondary | ICD-10-CM | POA: Diagnosis not present

## 2018-12-21 DIAGNOSIS — R2681 Unsteadiness on feet: Secondary | ICD-10-CM | POA: Diagnosis not present

## 2018-12-21 MED ORDER — PREGABALIN 50 MG PO CAPS: 50.0000 mg | ORAL_CAPSULE | Freq: Every day | ORAL | 0 refills | Status: DC

## 2018-12-21 NOTE — Telephone Encounter (Signed)
Received fax refill Request from [facility] °Pended Rx and sent to Dr. Gupta for approval.  °

## 2018-12-25 ENCOUNTER — Other Ambulatory Visit: Payer: Self-pay | Admitting: *Deleted

## 2018-12-25 DIAGNOSIS — Z9181 History of falling: Secondary | ICD-10-CM | POA: Diagnosis not present

## 2018-12-25 DIAGNOSIS — R41841 Cognitive communication deficit: Secondary | ICD-10-CM | POA: Diagnosis not present

## 2018-12-25 DIAGNOSIS — R2681 Unsteadiness on feet: Secondary | ICD-10-CM | POA: Diagnosis not present

## 2018-12-25 DIAGNOSIS — M545 Low back pain: Secondary | ICD-10-CM | POA: Diagnosis not present

## 2018-12-25 DIAGNOSIS — M6281 Muscle weakness (generalized): Secondary | ICD-10-CM | POA: Diagnosis not present

## 2018-12-25 MED ORDER — TRAMADOL HCL 50 MG PO TABS: 50.0000 mg | ORAL_TABLET | Freq: Two times a day (BID) | ORAL | 0 refills | Status: DC

## 2018-12-25 NOTE — Telephone Encounter (Signed)
Received fax from [facility] Pended Rx and sent to Dr. Gupta for approval.  

## 2018-12-26 ENCOUNTER — Non-Acute Institutional Stay: Payer: Medicare Other | Admitting: Nurse Practitioner

## 2018-12-26 ENCOUNTER — Encounter: Payer: Self-pay | Admitting: Nurse Practitioner

## 2018-12-26 DIAGNOSIS — I4891 Unspecified atrial fibrillation: Secondary | ICD-10-CM

## 2018-12-26 DIAGNOSIS — M545 Low back pain: Secondary | ICD-10-CM | POA: Diagnosis not present

## 2018-12-26 DIAGNOSIS — M6281 Muscle weakness (generalized): Secondary | ICD-10-CM | POA: Diagnosis not present

## 2018-12-26 DIAGNOSIS — K117 Disturbances of salivary secretion: Secondary | ICD-10-CM

## 2018-12-26 DIAGNOSIS — R531 Weakness: Secondary | ICD-10-CM

## 2018-12-26 DIAGNOSIS — K219 Gastro-esophageal reflux disease without esophagitis: Secondary | ICD-10-CM | POA: Diagnosis not present

## 2018-12-26 DIAGNOSIS — Z20828 Contact with and (suspected) exposure to other viral communicable diseases: Secondary | ICD-10-CM | POA: Diagnosis not present

## 2018-12-26 DIAGNOSIS — R41841 Cognitive communication deficit: Secondary | ICD-10-CM | POA: Diagnosis not present

## 2018-12-26 DIAGNOSIS — R2681 Unsteadiness on feet: Secondary | ICD-10-CM | POA: Diagnosis not present

## 2018-12-26 DIAGNOSIS — Z9181 History of falling: Secondary | ICD-10-CM | POA: Diagnosis not present

## 2018-12-26 NOTE — Assessment & Plan Note (Signed)
Sore mouth/throat, chronic, Magic mouth wash helped, but not resolving.

## 2018-12-26 NOTE — Assessment & Plan Note (Signed)
Stable, continue Famotidine.  

## 2018-12-26 NOTE — Assessment & Plan Note (Addendum)
On and off in am, better after eating, will obtain CBC/diff, CMP/eGFR, TSH,  Hgb a1c 12/28/18, CBG ac breakfast daily x 1 week.

## 2018-12-26 NOTE — Progress Notes (Signed)
Location:   [facility] Room Number: 1 Place of Service:  ALF [phone]) Provider:  Aarushi Hemric NP  [name], MD  Patient Care Team: [name], MD as PCP - General (Internal Medicine) Angelia Mould, MD as Attending Physician (Vascular Surgery) [name], MD (Inactive) as Attending Physician (Obstetrics and Gynecology) [name], MD (Orthopedic Surgery) [name], MD (Ophthalmology) [name], MD as Consulting Physician (Urology) [name], MD as Consulting Physician (Cardiology) [name], PA-C as Physician Assistant (Otolaryngology) [name], MD as Consulting Physician (Otolaryngology) [name], MD as Consulting Physician (Dermatology) [name], MD as Consulting Physician (Physical Medicine and Rehabilitation) [name], MD as Attending Physician (Gastroenterology) Aryav Wimberly X, NP as Nurse Practitioner (Internal Medicine) Kathrynn Ducking, MD as Consulting Physician (Neurology) Debara Pickett Nadean Corwin, MD as Consulting Physician (Cardiology) Druscilla Brownie, MD as Consulting Physician (Dermatology) [name], [name], NP as Nurse Practitioner (Family Medicine)  Extended Emergency Contact Information Primary Emergency Contact: [name] of [place] Phone: [phone] Mobile Phone: [phone] Relation: Son Secondary Emergency Contact: [name] Mobile Phone: [phone] Relation: Friend  Code Status:  Full Code Goals of care: Advanced Directive information Advanced Directives 10/27/2018  Does Patient Have a Medical Advance Directive? No  Type of Advance Directive -  Does patient want to make changes to medical advance directive? No - Patient declined  Copy of [place] in Chart? -  Would patient like information on creating a medical advance directive? -     Chief Complaint  Patient presents with  . Acute Visit    Hard time walking, tremors in  all extremeties.     HPI:  Pt is a 85 y.o. female seen today for an acute visit for weakness, tremors in all extremities in am, better after eating, on and off about a year. The patient denied SOB, chest pain/pressrue, palpitation, nausea, vomiting, constipation/diarrhea, or dysuria. Hx of dry mouth, sore mouth/throat is chronic, slightly better after Magic mouth wash. Afib, heart rate is controlled, on Atenolol 12.37m bid. GERD, stable, taking Famotidine 261mqd.    Past Medical History:  Diagnosis Date  . Abdominal bloating   . Atrial [place]  . Bruises easily   . Carotid artery occlusion    LEFT  . Dizziness   . DOE (dyspnea on exertion) 04/02/2014  . Fall at home Sept 2013, Dec. 2013  Jun 08, 2012  . GERD (gastroesophageal reflux disease) 02/11/2015  . Headache(784.0)   . Hoarseness   . Hypercholesterolemia   . Hyperglycemia 04/02/2014   Glucose 178 mg percent on 01/22/2014 04/05/14 Hgb A1c 6.6 Diet controlled.    . Hypothyroidism 04/15/2015  . Neuropathy    PERIPHERAL  . Pruritus   . Scoliosis   . Varicose veins    Past Surgical History:  Procedure Laterality Date  . ABDOMINAL HYSTERECTOMY  1954  . CHOLECYSTECTOMY  1997  . ESOPHAGOGASTRODUODENOSCOPY (EGD) WITH PROPOFOL N/A 11/25/2016   Procedure: ESOPHAGOGASTRODUODENOSCOPY (EGD) WITH PROPOFOL;  Surgeon: JaMilus BanisterMD;  Location: [facility];  Service: Endoscopy;  Laterality: N/A;  . [place] scoliosis    No Known Allergies  Allergies as of 12/26/2018   No Known Allergies     Medication List       Accurate as of December 26, 2018  3:15 PM. If you have any questions, ask your nurse or doctor.        acetaminophen  500 MG tablet Commonly known as: TYLENOL Take 500 mg by mouth 2 (two) times daily.   acetaminophen 325 MG tablet Commonly known as: TYLENOL Take 650 mg by mouth every 8 (eight) hours as needed.   aspirin 81 MG chewable tablet Chew 81 mg by mouth daily.     atenolol 25 MG tablet Commonly known as: TENORMIN Take 12.5 mg by mouth 2 (two) times daily. Hold if SBP < 100 or HR < 60   Biofreeze Roll-On 4 % Gel Generic drug: Menthol (Topical Analgesic) Apply 1 application topically every 8 (eight) hours. Apply to LLE   Biotin 5000 MCG Tabs Take 5,000 mcg by mouth daily.   famotidine 20 MG tablet Commonly known as: PEPCID Take 20 mg by mouth daily.   fluticasone 50 MCG/ACT nasal spray Commonly known as: FLONASE Place 2 sprays into both nostrils daily.   hydrogen peroxide 1.5 % Soln Use 15 ml orally, swish and spit as needed for dry mouth.   IBgard 90 MG Cpcr Generic drug: Peppermint Oil Take 2 capsules by mouth 2 (two) times daily.   ipratropium 0.06 % nasal spray Commonly known as: ATROVENT Place 2 sprays into both nostrils 2 (two) times daily as needed for rhinitis.   lactose free nutrition Liqd Take 237 mLs by mouth 2 (two) times daily with a meal. Per request of resident, states can't eat if takes it later   lidocaine-prilocaine cream Commonly known as: EMLA Apply 1 application topically daily as needed.   loratadine 10 MG tablet Commonly known as: CLARITIN Take 10 mg by mouth daily as needed for allergies.   magic mouthwash Soln Take 5 mLs by mouth. Use 5 ml before meals and at bedtime, for 14 days. Shake well, In Refrigerator. Before Meals and At Bedtime   Multivital tablet Take 1 tablet by mouth daily.   ondansetron 4 MG tablet Commonly known as: ZOFRAN Take 4 mg by mouth every 6 (six) hours as needed for nausea or vomiting.   polyethylene glycol 17 g packet Commonly known as: MIRALAX / GLYCOLAX Take 17 g daily as needed by mouth (for constipation).   pregabalin 25 MG capsule Commonly known as: Lyrica Take one capsule by mouth once daily every morning   pregabalin 50 MG capsule Commonly known as: LYRICA Take 1 capsule (50 mg total) by mouth at bedtime.   SalivaMAX Pack Use as directed 1 Package in the  mouth or throat. mix with 53m's of H20; swish and spit BID; may keep in bathroom; may self administer.   sennosides-docusate sodium 8.6-50 MG tablet Commonly known as: SENOKOT-S Take 2 tablets by mouth daily.   sodium phosphate 7-19 GM/118ML Enem Place 1 enema rectally as needed for severe constipation. Give Fleet enema per rectum X 1 after removing hard stool or if soft stool is present in rectum.   Systane Balance 0.6 % Soln Generic drug: Propylene Glycol Give 1 drop to both eyes once daily as needed for dryness   traMADol 50 MG tablet Commonly known as: ULTRAM Take 1 tablet (50 mg total) by mouth 2 (two) times daily.   triamcinolone cream 0.1 % Commonly known as: KENALOG Apply 1 application topically as needed. Apply to itchy areas as needed. May keep in room and self apply   [facility]by mouth. Take 1 in the morning.       Review of Systems  Constitutional: Positive for fatigue. Negative for activity change, appetite change, chills, diaphoresis and fever.  HENT:  Positive for hearing loss. Negative for congestion and voice change.        Dry mouth, sore mouth/throat is chronic.   Eyes: Negative for visual disturbance.  Respiratory: Negative for cough, shortness of breath and wheezing.   Cardiovascular: Negative for chest pain, palpitations and leg swelling.  Gastrointestinal: Negative for abdominal distention, abdominal pain, constipation, diarrhea, nausea and vomiting.  Genitourinary: Negative for difficulty urinating, dysuria and urgency.  Musculoskeletal: Positive for gait problem.  Skin: Negative for color change and pallor.  Neurological: Positive for tremors and weakness. Negative for dizziness and headaches.       On and off am tremulous in all extremities, weakness that she cannot walk for a year, feeling better after eating  Psychiatric/Behavioral: Negative for agitation, behavioral problems, hallucinations and sleep disturbance. The  patient is not nervous/anxious.     Immunization History  Administered Date(s) Administered  . Influenza Split 10/20/2011, 11/03/2012  . Influenza,inj,Quad PF,6+ Mos 09/29/2012, 11/09/2013  . Influenza-Unspecified 10/17/2014, 10/23/2015, 10/20/2016, 10/17/2017  . PPD Test 08/21/2013, 03/04/2014  . Pneumococcal Conjugate-13 08/21/2013  . Pneumococcal Polysaccharide-23 09/08/2016  . Tdap 05/20/2008  . Tetanus 11/02/2018  . Zoster 11/01/2012   Pertinent  Health Maintenance Due  Topic Date Due  . INFLUENZA VACCINE  Completed  . DEXA SCAN  Completed  . PNA vac Low Risk Adult  Completed   Fall Risk  11/04/2017 09/05/2017 09/22/2016 08/25/2016 05/31/2016  Falls in the past year? Yes Yes Yes Yes No  Number falls in past yr: 2 or more 2 or more 1 2 or more -  Comment - - - - -  Injury with Fall? Yes Yes Yes Yes -  Comment - - fx 3 ribs - -  Risk Factor Category  - - - - -  Risk for fall due to : - - Impaired mobility;Impaired balance/gait - -  Risk for fall due to: Comment - - - - -   Functional Status Survey:    Vitals:   12/26/18 1134  BP: (!) 154/73  Pulse: 70  Resp: 20  Temp: 97.8 F (36.6 C)  SpO2: 96%  Weight: 103 lb (46.7 kg)  Height: _0  (1.575 m)   Body mass index is 18.84 kg/m. Physical Exam Vitals and nursing note reviewed.  Constitutional:      General: She is not in acute distress.    Appearance: Normal appearance. She is not ill-appearing, toxic-appearing or diaphoretic.  HENT:     Head: Normocephalic and atraumatic.     Nose: Nose normal.     Mouth/Throat:     Mouth: Mucous membranes are moist.     Comments: No coating on tongue  Eyes:     Extraocular Movements: Extraocular movements intact.     Conjunctiva/sclera: Conjunctivae normal.     Pupils: Pupils are equal, round, and reactive to light.  Cardiovascular:     Rate and Rhythm: Normal rate. Rhythm irregular.     Heart sounds: No murmur.  Pulmonary:     Breath sounds: No wheezing, rhonchi or  rales.  Abdominal:     General: Bowel sounds are normal. There is no distension.     Palpations: Abdomen is soft.     Tenderness: There is no abdominal tenderness. There is no right CVA tenderness, left CVA tenderness, guarding or rebound.  Musculoskeletal:     Cervical back: Normal range of motion and neck supple.     Right lower leg: No edema.     Left lower leg: No  edema.  Skin:    General: Skin is warm and dry.     Comments: Varicose veins BLE  Neurological:     General: No focal deficit present.     Mental Status: She is alert and oriented to person, place, and time. Mental status is at baseline.     Motor: No weakness.     Coordination: Coordination normal.     Gait: Gait abnormal.  Psychiatric:        Mood and Affect: Mood normal.        Behavior: Behavior normal.        Thought Content: Thought content normal.        Judgment: Judgment normal.     Labs reviewed: Recent Labs    03/06/18 0000 03/30/18 1252 04/06/18 0000  NA  --  134* 138  K  --  4.3 4.6  CL  --  100  --   CO2  --  26  --   GLUCOSE  --  109*  --   BUN _0 CREATININE 0.8 0.93 0.9  CALCIUM  --  9.7  --    Recent Labs    03/06/18 0000 03/30/18 1252  AST 21 30  ALT 12 16  ALKPHOS  --  77  BILITOT  --  0.6  PROT  --  6.2*  ALBUMIN  --  3.6   Recent Labs    03/06/18 0000 03/30/18 1252 04/06/18 0000  WBC  --  8.1  --   HGB 12.4 13.2 13.2  HCT 36 40.1 39  MCV  --  102.6*  --   PLT 217 196 200   Lab Results  Component Value Date   TSH 3.44 08/04/2017   Lab Results  Component Value Date   HGBA1C 6.4 07/07/2015   Lab Results  Component Value Date   CHOL 187 11/03/2017   HDL 84 (A) 11/03/2017   LDLCALC 80 11/03/2017   TRIG 136 11/03/2017   CHOLHDL 2.1 07/29/2016    Significant Diagnostic Results in last 30 days:  No results found.  Assessment/Plan Weakness On and off in am, better after eating, will obtain CBC/diff, CMP/eGFR, TSH,  Hgb a1c 12/28/18, CBG ac breakfast  daily x 1 week.   Xerostomia Sore mouth/throat, chronic, Magic mouth wash helped, but not resolving.   Atrial fibrillation with RVR (HCC) Heart rate is in control, continue Atenolol.   GERD (gastroesophageal reflux disease) Stable, continue Famotidine.      Family/ staff Communication: Plan of care reviewed with the patient and charge nurse.   Labs/tests ordered:  CBC/diff, CMP/eGFR, Hgb a1c. TSH   Time spend 25 minutes.

## 2018-12-26 NOTE — Assessment & Plan Note (Signed)
Heart rate is in control, continue Atenolol.  

## 2018-12-28 DIAGNOSIS — R2681 Unsteadiness on feet: Secondary | ICD-10-CM | POA: Diagnosis not present

## 2018-12-28 DIAGNOSIS — Z9181 History of falling: Secondary | ICD-10-CM | POA: Diagnosis not present

## 2018-12-28 DIAGNOSIS — M6281 Muscle weakness (generalized): Secondary | ICD-10-CM | POA: Diagnosis not present

## 2018-12-28 DIAGNOSIS — M545 Low back pain: Secondary | ICD-10-CM | POA: Diagnosis not present

## 2018-12-28 DIAGNOSIS — R41841 Cognitive communication deficit: Secondary | ICD-10-CM | POA: Diagnosis not present

## 2018-12-28 LAB — BASIC METABOLIC PANEL
BUN: 17 (ref 4–21)
CO2: 29 — AB (ref 13–22)
Chloride: 105 (ref 99–108)
Creatinine: 0.8 (ref 0.5–1.1)
Glucose: 81
Potassium: 4.6 (ref 3.4–5.3)
Sodium: 139 (ref 137–147)

## 2018-12-28 LAB — HEMOGLOBIN A1C: Hemoglobin A1C: 5.8

## 2018-12-28 LAB — HEPATIC FUNCTION PANEL
ALT: 13 (ref 7–35)
AST: 23 (ref 13–35)
Alkaline Phosphatase: 68 (ref 25–125)
Bilirubin, Total: 0.5

## 2018-12-28 LAB — CBC AND DIFFERENTIAL
HCT: 36 (ref 36–46)
Hemoglobin: 12.5 (ref 12.0–16.0)
Neutrophils Absolute: 3398
Platelets: 202 (ref 150–399)
WBC: 6.2

## 2018-12-28 LAB — CBC: RBC: 3.59 — AB (ref 3.87–5.11)

## 2018-12-28 LAB — COMPREHENSIVE METABOLIC PANEL
Albumin: 3.5 (ref 3.5–5.0)
Calcium: 9 (ref 8.7–10.7)
Globulin: 2.2

## 2018-12-28 LAB — TSH: TSH: 5.11 (ref 0.41–5.90)

## 2018-12-29 DIAGNOSIS — Z9181 History of falling: Secondary | ICD-10-CM | POA: Diagnosis not present

## 2018-12-29 DIAGNOSIS — R41841 Cognitive communication deficit: Secondary | ICD-10-CM | POA: Diagnosis not present

## 2018-12-29 DIAGNOSIS — M6281 Muscle weakness (generalized): Secondary | ICD-10-CM | POA: Diagnosis not present

## 2018-12-29 DIAGNOSIS — R2681 Unsteadiness on feet: Secondary | ICD-10-CM | POA: Diagnosis not present

## 2018-12-29 DIAGNOSIS — M545 Low back pain: Secondary | ICD-10-CM | POA: Diagnosis not present

## 2018-12-31 DIAGNOSIS — R2681 Unsteadiness on feet: Secondary | ICD-10-CM | POA: Diagnosis not present

## 2018-12-31 DIAGNOSIS — Z9181 History of falling: Secondary | ICD-10-CM | POA: Diagnosis not present

## 2018-12-31 DIAGNOSIS — M6281 Muscle weakness (generalized): Secondary | ICD-10-CM | POA: Diagnosis not present

## 2018-12-31 DIAGNOSIS — R41841 Cognitive communication deficit: Secondary | ICD-10-CM | POA: Diagnosis not present

## 2018-12-31 DIAGNOSIS — M545 Low back pain: Secondary | ICD-10-CM | POA: Diagnosis not present

## 2019-01-01 DIAGNOSIS — M545 Low back pain: Secondary | ICD-10-CM | POA: Diagnosis not present

## 2019-01-01 DIAGNOSIS — R2681 Unsteadiness on feet: Secondary | ICD-10-CM | POA: Diagnosis not present

## 2019-01-01 DIAGNOSIS — R41841 Cognitive communication deficit: Secondary | ICD-10-CM | POA: Diagnosis not present

## 2019-01-01 DIAGNOSIS — M6281 Muscle weakness (generalized): Secondary | ICD-10-CM | POA: Diagnosis not present

## 2019-01-01 DIAGNOSIS — Z9181 History of falling: Secondary | ICD-10-CM | POA: Diagnosis not present

## 2019-01-01 DIAGNOSIS — Z20828 Contact with and (suspected) exposure to other viral communicable diseases: Secondary | ICD-10-CM | POA: Diagnosis not present

## 2019-01-02 DIAGNOSIS — M6281 Muscle weakness (generalized): Secondary | ICD-10-CM | POA: Diagnosis not present

## 2019-01-02 DIAGNOSIS — Z9181 History of falling: Secondary | ICD-10-CM | POA: Diagnosis not present

## 2019-01-02 DIAGNOSIS — R41841 Cognitive communication deficit: Secondary | ICD-10-CM | POA: Diagnosis not present

## 2019-01-02 DIAGNOSIS — M545 Low back pain: Secondary | ICD-10-CM | POA: Diagnosis not present

## 2019-01-02 DIAGNOSIS — R2681 Unsteadiness on feet: Secondary | ICD-10-CM | POA: Diagnosis not present

## 2019-01-03 ENCOUNTER — Ambulatory Visit: Payer: Medicare Other | Admitting: Neurology

## 2019-01-03 DIAGNOSIS — Z9181 History of falling: Secondary | ICD-10-CM | POA: Diagnosis not present

## 2019-01-03 DIAGNOSIS — M6281 Muscle weakness (generalized): Secondary | ICD-10-CM | POA: Diagnosis not present

## 2019-01-03 DIAGNOSIS — R2681 Unsteadiness on feet: Secondary | ICD-10-CM | POA: Diagnosis not present

## 2019-01-03 DIAGNOSIS — M545 Low back pain: Secondary | ICD-10-CM | POA: Diagnosis not present

## 2019-01-03 DIAGNOSIS — R41841 Cognitive communication deficit: Secondary | ICD-10-CM | POA: Diagnosis not present

## 2019-01-04 DIAGNOSIS — R41841 Cognitive communication deficit: Secondary | ICD-10-CM | POA: Diagnosis not present

## 2019-01-04 DIAGNOSIS — M6281 Muscle weakness (generalized): Secondary | ICD-10-CM | POA: Diagnosis not present

## 2019-01-04 DIAGNOSIS — M545 Low back pain: Secondary | ICD-10-CM | POA: Diagnosis not present

## 2019-01-04 DIAGNOSIS — Z9181 History of falling: Secondary | ICD-10-CM | POA: Diagnosis not present

## 2019-01-04 DIAGNOSIS — R2681 Unsteadiness on feet: Secondary | ICD-10-CM | POA: Diagnosis not present

## 2019-01-08 DIAGNOSIS — Z9181 History of falling: Secondary | ICD-10-CM | POA: Diagnosis not present

## 2019-01-08 DIAGNOSIS — R41841 Cognitive communication deficit: Secondary | ICD-10-CM | POA: Diagnosis not present

## 2019-01-08 DIAGNOSIS — R2681 Unsteadiness on feet: Secondary | ICD-10-CM | POA: Diagnosis not present

## 2019-01-08 DIAGNOSIS — M545 Low back pain: Secondary | ICD-10-CM | POA: Diagnosis not present

## 2019-01-08 DIAGNOSIS — M6281 Muscle weakness (generalized): Secondary | ICD-10-CM | POA: Diagnosis not present

## 2019-01-09 ENCOUNTER — Encounter: Payer: Self-pay | Admitting: Nurse Practitioner

## 2019-01-09 ENCOUNTER — Non-Acute Institutional Stay: Payer: Medicare Other | Admitting: Nurse Practitioner

## 2019-01-09 DIAGNOSIS — K219 Gastro-esophageal reflux disease without esophagitis: Secondary | ICD-10-CM

## 2019-01-09 DIAGNOSIS — Z9181 History of falling: Secondary | ICD-10-CM | POA: Diagnosis not present

## 2019-01-09 DIAGNOSIS — R739 Hyperglycemia, unspecified: Secondary | ICD-10-CM

## 2019-01-09 DIAGNOSIS — E039 Hypothyroidism, unspecified: Secondary | ICD-10-CM

## 2019-01-09 DIAGNOSIS — R41841 Cognitive communication deficit: Secondary | ICD-10-CM | POA: Diagnosis not present

## 2019-01-09 DIAGNOSIS — K5900 Constipation, unspecified: Secondary | ICD-10-CM

## 2019-01-09 DIAGNOSIS — I4891 Unspecified atrial fibrillation: Secondary | ICD-10-CM

## 2019-01-09 DIAGNOSIS — M545 Low back pain: Secondary | ICD-10-CM | POA: Diagnosis not present

## 2019-01-09 DIAGNOSIS — G629 Polyneuropathy, unspecified: Secondary | ICD-10-CM | POA: Diagnosis not present

## 2019-01-09 DIAGNOSIS — G8929 Other chronic pain: Secondary | ICD-10-CM

## 2019-01-09 DIAGNOSIS — M6281 Muscle weakness (generalized): Secondary | ICD-10-CM | POA: Diagnosis not present

## 2019-01-09 DIAGNOSIS — Z20828 Contact with and (suspected) exposure to other viral communicable diseases: Secondary | ICD-10-CM | POA: Diagnosis not present

## 2019-01-09 DIAGNOSIS — R2681 Unsteadiness on feet: Secondary | ICD-10-CM | POA: Diagnosis not present

## 2019-01-09 NOTE — Progress Notes (Signed)
Location:    [facility] Room Number: 1 Place of Service:  ALF (13) Provider:  ,  NP  [name], MD  Patient Care Team: [name], MD as PCP - General (Internal Medicine) Angelia Mould, MD as Attending Physician (Vascular Surgery) [name], MD (Inactive) as Attending Physician (Obstetrics and Gynecology) [name], MD (Orthopedic Surgery) [name], MD (Ophthalmology) [name], MD as Consulting Physician (Urology) [name], MD as Consulting Physician (Cardiology) [name], PA-C as Physician Assistant (Otolaryngology) [name], MD as Consulting Physician (Otolaryngology) [name], MD as Consulting Physician (Dermatology) [name], MD as Consulting Physician (Physical Medicine and Rehabilitation) [name], MD as Attending Physician (Gastroenterology) ,  X, NP as Nurse Practitioner (Internal Medicine) Kathrynn Ducking, MD as Consulting Physician (Neurology) Debara Pickett Nadean Corwin, MD as Consulting Physician (Cardiology) Druscilla Brownie, MD as Consulting Physician (Dermatology) [name], [name], NP as Nurse Practitioner (Family Medicine)  Extended Emergency Contact Information Primary Emergency Contact: [name] of [place] Phone: [phone] Mobile Phone: [phone] Relation: Son Secondary Emergency Contact: [name] Mobile Phone: [phone] Relation: Friend  Code Status:  Full Code Goals of care: Advanced Directive information Advanced Directives 10/27/2018  Does Patient Have a Medical Advance Directive? No  Type of Advance Directive -  Does patient want to make changes to medical advance directive? No - Patient declined  Copy of [place] in Chart? -  Would patient like information on creating a medical advance directive? -     Chief Complaint  Patient presents with  . Medical agement of Chronic Issues     HPI:  Pt is a 85 y.o. female seen today for medical management of chronic diseases.    The patient resides in [place] for safety, care assistance, c/o morning weakness, better after eating, Hgb a1c 6.6 2016, recent 5.8 12/28/18. Hx of Afib, heart rate is in control, on Atenolol 12.35m bid. GERD, stable on Famotidine 268mqd. Chronic lower back pain, controlled, on Tylenol 50075mid, prn Tylenol 650m11mh, Tramadol 50mg30m, 50mg 51mLyrica 50mg q3m5mg qd70monstipation, stable, on Senokot S II qd, prn MiraLax.    Past Medical History:  Diagnosis Date  . Abdominal bloating   . Atrial fibrillation (HCC)   .[place] easily   . Carotid artery occlusion    LEFT  . Dizziness   . DOE (dyspnea on exertion) 04/02/2014  . Fall at home Sept 2013, Dec. 2013  Jun 08, 2012  . GERD (gastroesophageal reflux disease) 02/11/2015  . Headache(784.0)   . Hoarseness   . Hypercholesterolemia   . Hyperglycemia 04/02/2014   Glucose 178 mg percent on 01/22/2014 04/05/14 Hgb A1c 6.6 Diet controlled.    . Hypothyroidism 04/15/2015  . Neuropathy    PERIPHERAL  . Pruritus   . Scoliosis   . Varicose veins    Past Surgical History:  Procedure Laterality Date  . ABDOMINAL HYSTERECTOMY  1954  . CHOLECYSTECTOMY  1997  . ESOPHAGOGASTRODUODENOSCOPY (EGD) WITH PROPOFOL N/A 11/25/2016   Procedure: ESOPHAGOGASTRODUODENOSCOPY (EGD) WITH PROPOFOL;  Surgeon: Jacobs, Milus Banisterocation: [facility];  Service: Endoscopy;  Laterality: N/A;  . SPINE SUElbertact scoliosis    No Known Allergies  Allergies as of 01/09/2019   No Known Allergies     Medication List       Accurate as of January 09, 2019 11:59 PM. If you have any  questions, ask your nurse or doctor.        STOP taking these medications   magic mouthwash Soln Stopped by: Estefana Taylor X Nyxon Strupp, NP     TAKE these medications   acetaminophen 500 MG tablet Commonly known as: TYLENOL Take 500 mg by mouth 2 (two) times daily.    acetaminophen 325 MG tablet Commonly known as: TYLENOL Take 650 mg by mouth every 8 (eight) hours as needed.   aspirin 81 MG chewable tablet Chew 81 mg by mouth daily.   atenolol 25 MG tablet Commonly known as: TENORMIN Take 12.5 mg by mouth 2 (two) times daily. Hold if SBP < 100 or HR < 60   Biofreeze Roll-On 4 % Gel Generic drug: Menthol (Topical Analgesic) Apply 1 application topically every 8 (eight) hours. Apply to LLE   Biotin 5000 MCG Tabs Take 5,000 mcg by mouth daily.   famotidine 20 MG tablet Commonly known as: PEPCID Take 20 mg by mouth daily.   fluticasone 50 MCG/ACT nasal spray Commonly known as: FLONASE Place 2 sprays into both nostrils daily.   hydrogen peroxide 1.5 % Soln Use 15 ml orally, swish and spit as needed for dry mouth.   IBgard 90 MG Cpcr Generic drug: Peppermint Oil Take 2 capsules by mouth 2 (two) times daily.   ipratropium 0.06 % nasal spray Commonly known as: ATROVENT Place 2 sprays into both nostrils 2 (two) times daily as needed for rhinitis.   lactose free nutrition Liqd Take 237 mLs by mouth 2 (two) times daily with a meal. Per request of resident, states can't eat if takes it later   lidocaine-prilocaine cream Commonly known as: EMLA Apply 1 application topically daily as needed.   loratadine 10 MG tablet Commonly known as: CLARITIN Take 10 mg by mouth daily as needed for allergies.   Multivital tablet Take 1 tablet by mouth daily.   ondansetron 4 MG tablet Commonly known as: ZOFRAN Take 4 mg by mouth every 6 (six) hours as needed for nausea or vomiting.   polyethylene glycol 17 g packet Commonly known as: MIRALAX / GLYCOLAX Take 17 g daily as needed by mouth (for constipation).   pregabalin 25 MG capsule Commonly known as: Lyrica Take one capsule by mouth once daily every morning   pregabalin 50 MG capsule Commonly known as: LYRICA Take 1 capsule (50 mg total) by mouth at bedtime.   SalivaMAX Pack Use as  directed 1 Package in the mouth or throat. mix with 33m's of H20; swish and spit BID; may keep in bathroom; may self administer.   sennosides-docusate sodium 8.6-50 MG tablet Commonly known as: SENOKOT-S Take 2 tablets by mouth daily.   sodium phosphate 7-19 GM/118ML Enem Place 1 enema rectally as needed for severe constipation. Give Fleet enema per rectum X 1 after removing hard stool or if soft stool is present in rectum.   Systane Balance 0.6 % Soln Generic drug: Propylene Glycol Give 1 drop to both eyes once daily as needed for dryness   traMADol 50 MG tablet Commonly known as: ULTRAM Take 50 mg by mouth daily. As needed   traMADol 50 MG tablet Commonly known as: ULTRAM Take 1 tablet (50 mg total) by mouth 2 (two) times daily.   triamcinolone cream 0.1 % Commonly known as: KENALOG Apply 1 application topically as needed. Apply to itchy areas as needed. May keep in room and self apply   [facility]by mouth. Take 1 in the morning.  Review of Systems  Constitutional: Negative for activity change, appetite change, chills, diaphoresis, fatigue, fever and unexpected weight change.  HENT: Positive for hearing loss. Negative for congestion and voice change.        Chronic dry mouth.   Eyes: Negative for visual disturbance.  Respiratory: Negative for cough, shortness of breath and wheezing.   Gastrointestinal: Negative for abdominal distention, abdominal pain, constipation, diarrhea, nausea and vomiting.  Genitourinary: Negative for difficulty urinating, dysuria and urgency.  Musculoskeletal: Positive for back pain and gait problem.  Skin: Negative for color change and pallor.  Neurological: Negative for dizziness, speech difficulty, weakness and headaches.  Psychiatric/Behavioral: Negative for agitation, behavioral problems, hallucinations and sleep disturbance. The patient is not nervous/anxious.     Immunization History  Administered Date(s)  Administered  . Influenza Split 10/20/2011, 11/03/2012  . Influenza,inj,Quad PF,6+ Mos 09/29/2012, 11/09/2013  . Influenza-Unspecified 10/17/2014, 10/23/2015, 10/20/2016, 10/17/2017  . PPD Test 08/21/2013, 03/04/2014  . Pneumococcal Conjugate-13 08/21/2013  . Pneumococcal Polysaccharide-23 09/08/2016  . Tdap 05/20/2008  . Tetanus 11/02/2018  . Zoster 11/01/2012   Pertinent  Health Maintenance Due  Topic Date Due  . INFLUENZA VACCINE  Completed  . DEXA SCAN  Completed  . PNA vac Low Risk Adult  Completed   Fall Risk  11/04/2017 09/05/2017 09/22/2016 08/25/2016 05/31/2016  Falls in the past year? Yes Yes Yes Yes No  Number falls in past yr: 2 or more 2 or more 1 2 or more -  Comment - - - - -  Injury with Fall? Yes Yes Yes Yes -  Comment - - fx 3 ribs - -  Risk Factor Category  - - - - -  Risk for fall due to : - - Impaired mobility;Impaired balance/gait - -  Risk for fall due to: Comment - - - - -   Functional Status Survey:    Vitals:   01/09/19 1221  BP: (!) 160/63  Pulse: 82  Resp: 20  Temp: (!) 97.3 F (36.3 C)  SpO2: 100%  Weight: 103 lb (46.7 kg)  Height: 5' 2" (1.575 m)   Body mass index is 18.84 kg/m. Physical Exam Vitals and nursing note reviewed.  Constitutional:      General: She is not in acute distress.    Appearance: Normal appearance. She is normal weight. She is not ill-appearing, toxic-appearing or diaphoretic.  HENT:     Head: Normocephalic and atraumatic.     Nose: Nose normal.     Mouth/Throat:     Mouth: Mucous membranes are moist.  Eyes:     Extraocular Movements: Extraocular movements intact.     Conjunctiva/sclera: Conjunctivae normal.     Pupils: Pupils are equal, round, and reactive to light.  Cardiovascular:     Rate and Rhythm: Normal rate. Rhythm irregular.     Heart sounds: No murmur.  Pulmonary:     Breath sounds: No wheezing, rhonchi or rales.  Abdominal:     General: Bowel sounds are normal. There is no distension.      Palpations: Abdomen is soft.     Tenderness: There is no abdominal tenderness. There is no right CVA tenderness, left CVA tenderness, guarding or rebound.  Musculoskeletal:     Cervical back: Normal range of motion and neck supple.     Right lower leg: No edema.     Left lower leg: No edema.  Skin:    General: Skin is warm and dry.     Comments: Varicose veins.  Neurological:     General: No focal deficit present.     Mental Status: She is alert. Mental status is at baseline.     Motor: No weakness.     Coordination: Coordination normal.     Gait: Gait abnormal.     Comments: Oriented to person, place.   Psychiatric:        Mood and Affect: Mood normal.        Behavior: Behavior normal.        Thought Content: Thought content normal.        Judgment: Judgment normal.     Labs reviewed: Recent Labs    03/06/18 0000 03/30/18 1252 04/06/18 0000  NA  --  134* 138  K  --  4.3 4.6  CL  --  100  --   CO2  --  26  --   GLUCOSE  --  109*  --   BUN _0 CREATININE 0.8 0.93 0.9  CALCIUM  --  9.7  --    Recent Labs    03/06/18 0000 03/30/18 1252  AST 21 30  ALT 12 16  ALKPHOS  --  77  BILITOT  --  0.6  PROT  --  6.2*  ALBUMIN  --  3.6   Recent Labs    03/06/18 0000 03/30/18 1252 04/06/18 0000  WBC  --  8.1  --   HGB 12.4 13.2 13.2  HCT 36 40.1 39  MCV  --  102.6*  --   PLT 217 196 200   Lab Results  Component Value Date   TSH 3.44 08/04/2017   Lab Results  Component Value Date   HGBA1C 6.4 07/07/2015   Lab Results  Component Value Date   CHOL 187 11/03/2017   HDL 84 (A) 11/03/2017   LDLCALC 80 11/03/2017   TRIG 136 11/03/2017   CHOLHDL 2.1 07/29/2016    Significant Diagnostic Results in last 30 days:  No results found.  Assessment/Plan Atrial fibrillation with RVR (HCC) Heart rate is in control, continue Atenolol 12.67m bid.   GERD (gastroesophageal reflux disease) Stable, continue Famotidine 253mqd.   Neuropathy Lower back pain,  continue Lyrica, Tylenol, Tramadol.   Chronic lower back pain Chronic, continue Tylenol, Tramadol, Lyrica.   Constipation Stable, continue Senokot S II qhs, prn MiraLax.   Hyperglycemia CBG am showed 65, 95, 79, 87 may contributory to c/o am weakness, better after eat, will recommended small bedtime snack, , diet controlled, Hgb a1c 6.6 04/05/14, 5.8 12/28/18. Continue diet, exercise life style modification.   Hypothyroidism 12/28/18 TSH 5.11, wbc 6.2, Hgb 12.5, plt 202, neutrophils 54.8, Hgb a1c 5.8, Na 139, K 4.6, Bun 17. Creat 0.75, eGFR 67.  Repeat TSH Free T4 6 weeks     Family/ staff Communication: plan of care reviewed with the patient and charge nurse.   Labs/tests ordered:  Pending TSH free T4 in 6 weeks.   Time spend 40 minutes.

## 2019-01-10 DIAGNOSIS — M545 Low back pain: Secondary | ICD-10-CM | POA: Diagnosis not present

## 2019-01-10 DIAGNOSIS — Z9181 History of falling: Secondary | ICD-10-CM | POA: Diagnosis not present

## 2019-01-10 DIAGNOSIS — R2681 Unsteadiness on feet: Secondary | ICD-10-CM | POA: Diagnosis not present

## 2019-01-10 DIAGNOSIS — M6281 Muscle weakness (generalized): Secondary | ICD-10-CM | POA: Diagnosis not present

## 2019-01-10 DIAGNOSIS — R41841 Cognitive communication deficit: Secondary | ICD-10-CM | POA: Diagnosis not present

## 2019-01-11 ENCOUNTER — Encounter: Payer: Self-pay | Admitting: Nurse Practitioner

## 2019-01-11 DIAGNOSIS — R41841 Cognitive communication deficit: Secondary | ICD-10-CM | POA: Diagnosis not present

## 2019-01-11 DIAGNOSIS — M6281 Muscle weakness (generalized): Secondary | ICD-10-CM | POA: Diagnosis not present

## 2019-01-11 DIAGNOSIS — M545 Low back pain: Secondary | ICD-10-CM | POA: Diagnosis not present

## 2019-01-11 DIAGNOSIS — Z9181 History of falling: Secondary | ICD-10-CM | POA: Diagnosis not present

## 2019-01-11 DIAGNOSIS — R2681 Unsteadiness on feet: Secondary | ICD-10-CM | POA: Diagnosis not present

## 2019-01-11 NOTE — Assessment & Plan Note (Signed)
Stable, continue Senokot S II qhs, prn MiraLax.

## 2019-01-11 NOTE — Assessment & Plan Note (Signed)
Stable, continue Famotidine 20mg qd.  

## 2019-01-11 NOTE — Assessment & Plan Note (Signed)
Lower back pain, continue Lyrica, Tylenol, Tramadol.

## 2019-01-11 NOTE — Assessment & Plan Note (Signed)
Chronic, continue Tylenol, Tramadol, Lyrica.

## 2019-01-11 NOTE — Assessment & Plan Note (Signed)
12/28/18 TSH 5.11, wbc 6.2, Hgb 12.5, plt 202, neutrophils 54.8, Hgb a1c 5.8, Na 139, K 4.6, Bun 17. Creat 0.75, eGFR 67.  Repeat TSH Free T4 6 weeks

## 2019-01-11 NOTE — Assessment & Plan Note (Signed)
Heart rate is in control, continue Atenolol 12.5mg bid.  

## 2019-01-11 NOTE — Assessment & Plan Note (Addendum)
CBG am showed 65, 95, 79, 87 may contributory to c/o am weakness, better after eat, will recommended small bedtime snack, , diet controlled, Hgb a1c 6.6 04/05/14, 5.8 12/28/18. Continue diet, exercise life style modification.

## 2019-01-15 DIAGNOSIS — R2681 Unsteadiness on feet: Secondary | ICD-10-CM | POA: Diagnosis not present

## 2019-01-15 DIAGNOSIS — Z23 Encounter for immunization: Secondary | ICD-10-CM | POA: Diagnosis not present

## 2019-01-15 DIAGNOSIS — M545 Low back pain: Secondary | ICD-10-CM | POA: Diagnosis not present

## 2019-01-15 DIAGNOSIS — R41841 Cognitive communication deficit: Secondary | ICD-10-CM | POA: Diagnosis not present

## 2019-01-15 DIAGNOSIS — Z9181 History of falling: Secondary | ICD-10-CM | POA: Diagnosis not present

## 2019-01-15 DIAGNOSIS — M6281 Muscle weakness (generalized): Secondary | ICD-10-CM | POA: Diagnosis not present

## 2019-01-16 DIAGNOSIS — M6281 Muscle weakness (generalized): Secondary | ICD-10-CM | POA: Diagnosis not present

## 2019-01-16 DIAGNOSIS — R41841 Cognitive communication deficit: Secondary | ICD-10-CM | POA: Diagnosis not present

## 2019-01-16 DIAGNOSIS — Z9181 History of falling: Secondary | ICD-10-CM | POA: Diagnosis not present

## 2019-01-16 DIAGNOSIS — M545 Low back pain: Secondary | ICD-10-CM | POA: Diagnosis not present

## 2019-01-16 DIAGNOSIS — R2681 Unsteadiness on feet: Secondary | ICD-10-CM | POA: Diagnosis not present

## 2019-01-17 DIAGNOSIS — R2681 Unsteadiness on feet: Secondary | ICD-10-CM | POA: Diagnosis not present

## 2019-01-17 DIAGNOSIS — Z9181 History of falling: Secondary | ICD-10-CM | POA: Diagnosis not present

## 2019-01-17 DIAGNOSIS — R41841 Cognitive communication deficit: Secondary | ICD-10-CM | POA: Diagnosis not present

## 2019-01-17 DIAGNOSIS — M6281 Muscle weakness (generalized): Secondary | ICD-10-CM | POA: Diagnosis not present

## 2019-01-17 DIAGNOSIS — M545 Low back pain: Secondary | ICD-10-CM | POA: Diagnosis not present

## 2019-01-18 DIAGNOSIS — R2681 Unsteadiness on feet: Secondary | ICD-10-CM | POA: Diagnosis not present

## 2019-01-18 DIAGNOSIS — M545 Low back pain: Secondary | ICD-10-CM | POA: Diagnosis not present

## 2019-01-18 DIAGNOSIS — R41841 Cognitive communication deficit: Secondary | ICD-10-CM | POA: Diagnosis not present

## 2019-01-18 DIAGNOSIS — Z9181 History of falling: Secondary | ICD-10-CM | POA: Diagnosis not present

## 2019-01-18 DIAGNOSIS — M6281 Muscle weakness (generalized): Secondary | ICD-10-CM | POA: Diagnosis not present

## 2019-01-19 ENCOUNTER — Other Ambulatory Visit: Payer: Self-pay | Admitting: *Deleted

## 2019-01-19 DIAGNOSIS — R2681 Unsteadiness on feet: Secondary | ICD-10-CM | POA: Diagnosis not present

## 2019-01-19 DIAGNOSIS — M6281 Muscle weakness (generalized): Secondary | ICD-10-CM | POA: Diagnosis not present

## 2019-01-19 DIAGNOSIS — M545 Low back pain: Secondary | ICD-10-CM | POA: Diagnosis not present

## 2019-01-19 DIAGNOSIS — R41841 Cognitive communication deficit: Secondary | ICD-10-CM | POA: Diagnosis not present

## 2019-01-19 DIAGNOSIS — Z9181 History of falling: Secondary | ICD-10-CM | POA: Diagnosis not present

## 2019-01-19 MED ORDER — PREGABALIN 25 MG PO CAPS: ORAL_CAPSULE | ORAL | 0 refills | Status: DC

## 2019-01-19 MED ORDER — PREGABALIN 50 MG PO CAPS: 50.0000 mg | ORAL_CAPSULE | Freq: Every day | ORAL | 0 refills | Status: DC

## 2019-01-19 NOTE — Telephone Encounter (Signed)
Received fax refill from [facility] Pended Rx's and sent to Dr. Lyndel Safe for approval.

## 2019-01-22 DIAGNOSIS — R2681 Unsteadiness on feet: Secondary | ICD-10-CM | POA: Diagnosis not present

## 2019-01-22 DIAGNOSIS — M6281 Muscle weakness (generalized): Secondary | ICD-10-CM | POA: Diagnosis not present

## 2019-01-22 DIAGNOSIS — Z9181 History of falling: Secondary | ICD-10-CM | POA: Diagnosis not present

## 2019-01-22 DIAGNOSIS — R41841 Cognitive communication deficit: Secondary | ICD-10-CM | POA: Diagnosis not present

## 2019-01-22 DIAGNOSIS — M545 Low back pain: Secondary | ICD-10-CM | POA: Diagnosis not present

## 2019-01-23 ENCOUNTER — Other Ambulatory Visit: Payer: Self-pay | Admitting: *Deleted

## 2019-01-23 DIAGNOSIS — R2681 Unsteadiness on feet: Secondary | ICD-10-CM | POA: Diagnosis not present

## 2019-01-23 DIAGNOSIS — Z9181 History of falling: Secondary | ICD-10-CM | POA: Diagnosis not present

## 2019-01-23 DIAGNOSIS — R41841 Cognitive communication deficit: Secondary | ICD-10-CM | POA: Diagnosis not present

## 2019-01-23 DIAGNOSIS — M545 Low back pain: Secondary | ICD-10-CM | POA: Diagnosis not present

## 2019-01-23 DIAGNOSIS — M6281 Muscle weakness (generalized): Secondary | ICD-10-CM | POA: Diagnosis not present

## 2019-01-23 MED ORDER — TRAMADOL HCL 50 MG PO TABS: 50.0000 mg | ORAL_TABLET | Freq: Two times a day (BID) | ORAL | 0 refills | Status: DC

## 2019-01-23 NOTE — Telephone Encounter (Signed)
[facility] faxed refill request Pended Rx and sent to Dr Lyndel Safe for approval.

## 2019-01-24 DIAGNOSIS — M545 Low back pain: Secondary | ICD-10-CM | POA: Diagnosis not present

## 2019-01-24 DIAGNOSIS — R41841 Cognitive communication deficit: Secondary | ICD-10-CM | POA: Diagnosis not present

## 2019-01-24 DIAGNOSIS — R2681 Unsteadiness on feet: Secondary | ICD-10-CM | POA: Diagnosis not present

## 2019-01-24 DIAGNOSIS — M6281 Muscle weakness (generalized): Secondary | ICD-10-CM | POA: Diagnosis not present

## 2019-01-24 DIAGNOSIS — Z9181 History of falling: Secondary | ICD-10-CM | POA: Diagnosis not present

## 2019-01-25 DIAGNOSIS — Z9181 History of falling: Secondary | ICD-10-CM | POA: Diagnosis not present

## 2019-01-25 DIAGNOSIS — M545 Low back pain: Secondary | ICD-10-CM | POA: Diagnosis not present

## 2019-01-25 DIAGNOSIS — M6281 Muscle weakness (generalized): Secondary | ICD-10-CM | POA: Diagnosis not present

## 2019-01-25 DIAGNOSIS — R2681 Unsteadiness on feet: Secondary | ICD-10-CM | POA: Diagnosis not present

## 2019-01-25 DIAGNOSIS — R41841 Cognitive communication deficit: Secondary | ICD-10-CM | POA: Diagnosis not present

## 2019-01-29 DIAGNOSIS — Z9181 History of falling: Secondary | ICD-10-CM | POA: Diagnosis not present

## 2019-01-29 DIAGNOSIS — M6281 Muscle weakness (generalized): Secondary | ICD-10-CM | POA: Diagnosis not present

## 2019-01-29 DIAGNOSIS — R41841 Cognitive communication deficit: Secondary | ICD-10-CM | POA: Diagnosis not present

## 2019-01-29 DIAGNOSIS — R2681 Unsteadiness on feet: Secondary | ICD-10-CM | POA: Diagnosis not present

## 2019-01-29 DIAGNOSIS — M545 Low back pain: Secondary | ICD-10-CM | POA: Diagnosis not present

## 2019-02-01 ENCOUNTER — Encounter: Payer: Self-pay | Admitting: Nurse Practitioner

## 2019-02-01 ENCOUNTER — Non-Acute Institutional Stay: Payer: Medicare Other | Admitting: Nurse Practitioner

## 2019-02-01 DIAGNOSIS — J209 Acute bronchitis, unspecified: Secondary | ICD-10-CM

## 2019-02-01 DIAGNOSIS — K121 Other forms of stomatitis: Secondary | ICD-10-CM

## 2019-02-01 DIAGNOSIS — K219 Gastro-esophageal reflux disease without esophagitis: Secondary | ICD-10-CM

## 2019-02-01 DIAGNOSIS — G629 Polyneuropathy, unspecified: Secondary | ICD-10-CM | POA: Diagnosis not present

## 2019-02-01 DIAGNOSIS — J309 Allergic rhinitis, unspecified: Secondary | ICD-10-CM | POA: Diagnosis not present

## 2019-02-01 DIAGNOSIS — M6281 Muscle weakness (generalized): Secondary | ICD-10-CM | POA: Diagnosis not present

## 2019-02-01 DIAGNOSIS — K117 Disturbances of salivary secretion: Secondary | ICD-10-CM

## 2019-02-01 DIAGNOSIS — I4891 Unspecified atrial fibrillation: Secondary | ICD-10-CM | POA: Diagnosis not present

## 2019-02-01 DIAGNOSIS — M545 Low back pain, unspecified: Secondary | ICD-10-CM

## 2019-02-01 DIAGNOSIS — Z9181 History of falling: Secondary | ICD-10-CM | POA: Diagnosis not present

## 2019-02-01 DIAGNOSIS — G8929 Other chronic pain: Secondary | ICD-10-CM | POA: Diagnosis not present

## 2019-02-01 DIAGNOSIS — B370 Candidal stomatitis: Secondary | ICD-10-CM | POA: Insufficient documentation

## 2019-02-01 DIAGNOSIS — R2681 Unsteadiness on feet: Secondary | ICD-10-CM | POA: Diagnosis not present

## 2019-02-01 DIAGNOSIS — R41841 Cognitive communication deficit: Secondary | ICD-10-CM | POA: Diagnosis not present

## 2019-02-01 NOTE — Progress Notes (Signed)
Location:   [facility] Room Number: 1 Place of Service:  ALF (13) Provider:  Mardee Clune NP  [name], MD  Patient Care Team: [name], MD as PCP - General (Internal Medicine) Angelia Mould, MD as Attending Physician (Vascular Surgery) [name], MD (Inactive) as Attending Physician (Obstetrics and Gynecology) [name], MD (Orthopedic Surgery) [name], MD (Ophthalmology) [name], MD as Consulting Physician (Urology) [name], MD as Consulting Physician (Cardiology) [name], PA-C as Physician Assistant (Otolaryngology) [name], MD as Consulting Physician (Otolaryngology) [name], MD as Consulting Physician (Dermatology) [name], MD as Consulting Physician (Physical Medicine and Rehabilitation) [name], MD as Attending Physician (Gastroenterology) Taequan Stockhausen X, NP as Nurse Practitioner (Internal Medicine) Kathrynn Ducking, MD as Consulting Physician (Neurology) Debara Pickett Nadean Corwin, MD as Consulting Physician (Cardiology) Druscilla Brownie, MD as Consulting Physician (Dermatology) [name], [name], NP as Nurse Practitioner (Family Medicine)  Extended Emergency Contact Information Primary Emergency Contact: [name] of [place] Phone: [phone] Mobile Phone: [phone] Relation: Son Secondary Emergency Contact: [name] Mobile Phone: [phone] Relation: Friend  Code Status:  Full Code Goals of care: Advanced Directive information Advanced Directives 10/27/2018  Does Patient Have a Medical Advance Directive? No  Type of Advance Directive -  Does patient want to make changes to medical advance directive? No - Patient declined  Copy of [place] in Chart? -  Would patient like information on creating a medical advance directive? -     Chief Complaint  Patient presents with  . Acute Visit    Sore throat    HPI:  Pt  is a 86 y.o. female seen today for an acute visit for the patient c/o cough, yellow sputum production for a few days, sore throat which is not new. The patient denied headache, chest pain/pressrue, palpitation, SOB. She is afebrile, no O2 desaturation. Hx of dry mouth, using Biotene prn. She was treated with Magic mouth wash for mouth lining inflammation. Hx of GERD, stable, on Famotidine. Afib, heart rate is controlled on Atenolol. Allergic rhinitis, treated with Flonase. Chronic lower back pain/neuropathy, stable, on Lyrica, Tramadol, Tylenol.    Past Medical History:  Diagnosis Date  . Abdominal bloating   . Atrial fibrillation ([place])   . Bruises easily   . Carotid artery occlusion    LEFT  . Dizziness   . DOE (dyspnea on exertion) 04/02/2014  . Fall at home Sept 2013, Dec. 2013  Jun 08, 2012  . GERD (gastroesophageal reflux disease) 02/11/2015  . Headache(784.0)   . Hoarseness   . Hypercholesterolemia   . Hyperglycemia 04/02/2014   Glucose 178 mg percent on 01/22/2014 04/05/14 Hgb A1c 6.6 Diet controlled.    . Hypothyroidism 04/15/2015  . Neuropathy    PERIPHERAL  . Pruritus   . Scoliosis   . Varicose veins    Past Surgical History:  Procedure Laterality Date  . ABDOMINAL HYSTERECTOMY  1954  . CHOLECYSTECTOMY  1997  . ESOPHAGOGASTRODUODENOSCOPY (EGD) WITH PROPOFOL N/A 11/25/2016   Procedure: ESOPHAGOGASTRODUODENOSCOPY (EGD) WITH PROPOFOL;  Surgeon: [name], MD;  Location: [facility];  Service: Endoscopy;  Laterality: N/A;  . [place]   correct scoliosis    No Known Allergies  Allergies as of 02/01/2019   No Known Allergies     Medication List       Accurate as of February 01, 2019 12:24 PM. If you have any questions, ask  your nurse or doctor.        acetaminophen 500 MG tablet Commonly known as: TYLENOL Take 500 mg by mouth 2 (two) times daily.   acetaminophen 325 MG tablet Commonly known as: TYLENOL Take 650 mg by mouth every 8 (eight) hours as  needed.   aspirin 81 MG chewable tablet Chew 81 mg by mouth daily.   atenolol 25 MG tablet Commonly known as: TENORMIN Take 12.5 mg by mouth 2 (two) times daily. Hold if SBP < 100 or HR < 60   Biofreeze Roll-On 4 % Gel Generic drug: Menthol (Topical Analgesic) Apply 1 application topically every 8 (eight) hours. Apply to LLE   Biotin 5000 MCG Tabs Take 5,000 mcg by mouth daily.   famotidine 20 MG tablet Commonly known as: PEPCID Take 20 mg by mouth daily.   fluticasone 50 MCG/ACT nasal spray Commonly known as: FLONASE Place 2 sprays into both nostrils daily.   hydrogen peroxide 1.5 % Soln Use 15 ml orally, swish and spit as needed for dry mouth.   IBgard 90 MG Cpcr Generic drug: Peppermint Oil Take 2 capsules by mouth 2 (two) times daily.   ipratropium 0.06 % nasal spray Commonly known as: ATROVENT Place 2 sprays into both nostrils 2 (two) times daily as needed for rhinitis.   lactose free nutrition Liqd Take 237 mLs by mouth 2 (two) times daily with a meal. Per request of resident, states can't eat if takes it later   lidocaine-prilocaine cream Commonly known as: EMLA Apply 1 application topically daily as needed.   loratadine 10 MG tablet Commonly known as: CLARITIN Take 10 mg by mouth daily as needed for allergies.   Multivital tablet Take 1 tablet by mouth daily.   ondansetron 4 MG tablet Commonly known as: ZOFRAN Take 4 mg by mouth every 6 (six) hours as needed for nausea or vomiting.   polyethylene glycol 17 g packet Commonly known as: MIRALAX / GLYCOLAX Take 17 g daily as needed by mouth (for constipation).   pregabalin 25 MG capsule Commonly known as: Lyrica Take one capsule by mouth once daily every morning   pregabalin 50 MG capsule Commonly known as: LYRICA Take 1 capsule (50 mg total) by mouth at bedtime.   SalivaMAX Pack Use as directed 1 Package in the mouth or throat. mix with 53mL's of H20; swish and spit BID; may keep in bathroom; may  self administer.   sennosides-docusate sodium 8.6-50 MG tablet Commonly known as: SENOKOT-S Take 2 tablets by mouth daily.   sodium phosphate 7-19 GM/118ML Enem Place 1 enema rectally as needed for severe constipation. Give Fleet enema per rectum X 1 after removing hard stool or if soft stool is present in rectum.   Systane Balance 0.6 % Soln Generic drug: Propylene Glycol Give 1 drop to both eyes once daily as needed for dryness   traMADol 50 MG tablet Commonly known as: ULTRAM Take 50 mg by mouth daily. As needed   traMADol 50 MG tablet Commonly known as: ULTRAM Take 1 tablet (50 mg total) by mouth 2 (two) times daily.   triamcinolone cream 0.1 % Commonly known as: KENALOG Apply 1 application topically as needed. Apply to itchy areas as needed. May keep in room and self apply   [facility] by mouth. Take 1 in the morning.      ROS was provided with assistance of staff.  Review of Systems  Constitutional: Negative for activity change, appetite change, chills, diaphoresis, fatigue  and fever.  HENT: Positive for hearing loss and sore throat. Negative for congestion, facial swelling, mouth sores, nosebleeds, postnasal drip, rhinorrhea, sinus pressure, sinus pain, trouble swallowing and voice change.   Eyes: Negative for visual disturbance.  Respiratory: Positive for cough. Negative for chest tightness, shortness of breath and wheezing.        C/o yellow phlegm production for a few days.   Cardiovascular: Negative for chest pain, palpitations and leg swelling.  Gastrointestinal: Negative for abdominal distention, abdominal pain, constipation, diarrhea, nausea and vomiting.  Genitourinary: Negative for difficulty urinating, dysuria and urgency.  Musculoskeletal: Positive for arthralgias, back pain and gait problem.  Skin: Negative for color change and pallor.  Neurological: Negative for dizziness, speech difficulty, weakness and headaches.   Psychiatric/Behavioral: Negative for agitation, hallucinations and sleep disturbance. The patient is not nervous/anxious.     Immunization History  Administered Date(s) Administered  . Influenza Split 10/20/2011, 11/03/2012  . Influenza, High Dose Seasonal PF 10/24/2018  . Influenza,inj,Quad PF,6+ Mos 09/29/2012, 11/09/2013  . Influenza-Unspecified 10/17/2014, 10/23/2015, 10/20/2016, 10/17/2017  . Moderna SARS-COVID-2 Vaccination 01/15/2019  . PPD Test 08/21/2013, 03/04/2014  . Pneumococcal Conjugate-13 08/21/2013  . Pneumococcal Polysaccharide-23 09/08/2016  . Tdap 05/20/2008  . Tetanus 11/02/2018  . Zoster 11/01/2012   Pertinent  Health Maintenance Due  Topic Date Due  . INFLUENZA VACCINE  Completed  . DEXA SCAN  Completed  . PNA vac Low Risk Adult  Completed   Fall Risk  11/04/2017 09/05/2017 09/22/2016 08/25/2016 05/31/2016  Falls in the past year? Yes Yes Yes Yes No  Number falls in past yr: 2 or more 2 or more 1 2 or more -  Comment - - - - -  Injury with Fall? Yes Yes Yes Yes -  Comment - - fx 3 ribs - -  Risk Factor Category  - - - - -  Risk for fall due to : - - Impaired mobility;Impaired balance/gait - -  Risk for fall due to: Comment - - - - -   Functional Status Survey:    Vitals:   02/01/19 1027  BP: 121/62  Pulse: 88  Resp: 18  Temp: (!) 97.5 F (36.4 C)  SpO2: 95%  Weight: 103 lb 12.8 oz (47.1 kg)  Height: 5\' 2"  (1.575 m)   Body mass index is 18.99 kg/m. Physical Exam Vitals and nursing note reviewed.  Constitutional:      General: She is not in acute distress.    Appearance: Normal appearance. She is not ill-appearing, toxic-appearing or diaphoretic.  HENT:     Head: Normocephalic and atraumatic.     Nose: Nose normal. No congestion or rhinorrhea.     Mouth/Throat:     Mouth: Mucous membranes are dry.     Pharynx: No posterior oropharyngeal erythema.     Comments: Inflamed appearance oral lining and throat, some yellow coated areas.  Eyes:      Extraocular Movements: Extraocular movements intact.     Conjunctiva/sclera: Conjunctivae normal.     Pupils: Pupils are equal, round, and reactive to light.  Cardiovascular:     Rate and Rhythm: Normal rate. Rhythm irregular.     Heart sounds: No murmur.  Pulmonary:     Effort: Pulmonary effort is normal.     Breath sounds: No wheezing, rhonchi or rales.  Abdominal:     General: Bowel sounds are normal. There is no distension.     Palpations: Abdomen is soft.     Tenderness: There is no abdominal  tenderness. There is no right CVA tenderness, left CVA tenderness, guarding or rebound.  Musculoskeletal:     Cervical back: Normal range of motion and neck supple.     Right lower leg: No edema.     Left lower leg: No edema.  Skin:    General: Skin is warm and dry.  Neurological:     General: No focal deficit present.     Mental Status: She is alert. Mental status is at baseline.     Motor: No weakness.     Coordination: Coordination normal.     Gait: Gait abnormal.     Comments: Oriented to person, place.   Psychiatric:        Mood and Affect: Mood normal.        Behavior: Behavior normal.        Thought Content: Thought content normal.        Judgment: Judgment normal.     Labs reviewed: Recent Labs    03/30/18 1252 04/06/18 0000 12/28/18 0000  NA 134* 138 139  K 4.3 4.6 4.6  CL 100  --  105  CO2 26  --  29*  GLUCOSE 109*  --   --   BUN 16 17 17   CREATININE 0.93 0.9 0.8  CALCIUM 9.7  --  9.0   Recent Labs    03/06/18 0000 03/30/18 1252 12/28/18 0000  AST 21 30 23   ALT 12 16 13   ALKPHOS  --  77 68  BILITOT  --  0.6  --   PROT  --  6.2*  --   ALBUMIN  --  3.6 3.5   Recent Labs    03/30/18 1252 04/06/18 0000 12/28/18 0000  WBC 8.1  --  6.2  NEUTROABS  --   --  3,398  HGB 13.2 13.2 12.5  HCT 40.1 39 36  MCV 102.6*  --   --   PLT 196 200 202   Lab Results  Component Value Date   TSH 5.11 12/28/2018   Lab Results  Component Value Date   HGBA1C 5.8  12/28/2018   Lab Results  Component Value Date   CHOL 187 11/03/2017   HDL 84 (A) 11/03/2017   LDLCALC 80 11/03/2017   TRIG 136 11/03/2017   CHOLHDL 2.1 07/29/2016    Significant Diagnostic Results in last 30 days:  No results found.  Assessment/Plan Acute bronchitis 02/01/19 cough, sore throat, cough up yellow sputum, denied SOB, chest pain/pressure, palpitation, she is afebrile, no O2 desaturation. Z-pk, Mucinex 600mg  bid x 5 days. May consider CXR if no better.    Candidiasis of mouth Inflamed oral lining and throat with scant yellow coating, contributory to c/o sore throat in setting of cough with yellow sputum production and Hx of dry mouth, will prn Magic mouth wash 34ml s/s qid. Continue Biotene for dry mouth.   Xerostomia Chronic, continue Biotene, artificial saliva.   Atrial fibrillation with RVR (HCC) Heart rate is in control, continue Atenolol.   GERD (gastroesophageal reflux disease) Stable, continue Famotidine.   Oral inflammation Dry mouth is contributory, continue artificial saliva, Biotene, prn Magic mouth wash.   Neuropathy Lower back pain, continue Tylenol, Tramadol, Lyrica.   Chronic lower back pain Chronic, continue Tylenol, Lyrica, Tramadol.   Allergic rhinitis Stable, continue Flonase for symptomatic control.      Family/ staff Communication: plan of care reviewed with the patient and charge nurse.   Labs/tests ordered:  none  Time spend 40 minutes.

## 2019-02-01 NOTE — Assessment & Plan Note (Signed)
Inflamed oral lining and throat with scant yellow coating, contributory to c/o sore throat in setting of cough with yellow sputum production and Hx of dry mouth, will prn Magic mouth wash 78ml s/s qid. Continue Biotene for dry mouth.

## 2019-02-01 NOTE — Assessment & Plan Note (Signed)
Chronic, continue Tylenol, Lyrica, Tramadol.

## 2019-02-01 NOTE — Assessment & Plan Note (Signed)
Dry mouth is contributory, continue artificial saliva, Biotene, prn Magic mouth wash.

## 2019-02-01 NOTE — Assessment & Plan Note (Addendum)
Chronic, continue Biotene, artificial saliva.

## 2019-02-01 NOTE — Assessment & Plan Note (Signed)
Lower back pain, continue Tylenol, Tramadol, Lyrica.

## 2019-02-01 NOTE — Assessment & Plan Note (Signed)
Heart rate is in control, continue Atenolol.  

## 2019-02-01 NOTE — Assessment & Plan Note (Signed)
02/01/19 cough, sore throat, cough up yellow sputum, denied SOB, chest pain/pressure, palpitation, she is afebrile, no O2 desaturation. Z-pk, Mucinex 600mg  bid x 5 days. May consider CXR if no better.

## 2019-02-01 NOTE — Assessment & Plan Note (Signed)
Stable, continue Famotidine.  

## 2019-02-01 NOTE — Assessment & Plan Note (Signed)
Stable, continue Flonase for symptomatic control.

## 2019-02-06 DIAGNOSIS — M545 Low back pain: Secondary | ICD-10-CM | POA: Diagnosis not present

## 2019-02-06 DIAGNOSIS — Z9181 History of falling: Secondary | ICD-10-CM | POA: Diagnosis not present

## 2019-02-06 DIAGNOSIS — R41841 Cognitive communication deficit: Secondary | ICD-10-CM | POA: Diagnosis not present

## 2019-02-06 DIAGNOSIS — R2681 Unsteadiness on feet: Secondary | ICD-10-CM | POA: Diagnosis not present

## 2019-02-06 DIAGNOSIS — M6281 Muscle weakness (generalized): Secondary | ICD-10-CM | POA: Diagnosis not present

## 2019-02-08 DIAGNOSIS — R41841 Cognitive communication deficit: Secondary | ICD-10-CM | POA: Diagnosis not present

## 2019-02-08 DIAGNOSIS — M6281 Muscle weakness (generalized): Secondary | ICD-10-CM | POA: Diagnosis not present

## 2019-02-08 DIAGNOSIS — R2681 Unsteadiness on feet: Secondary | ICD-10-CM | POA: Diagnosis not present

## 2019-02-08 DIAGNOSIS — M545 Low back pain: Secondary | ICD-10-CM | POA: Diagnosis not present

## 2019-02-08 DIAGNOSIS — R946 Abnormal results of thyroid function studies: Secondary | ICD-10-CM | POA: Diagnosis not present

## 2019-02-08 DIAGNOSIS — Z9181 History of falling: Secondary | ICD-10-CM | POA: Diagnosis not present

## 2019-02-08 LAB — TSH: TSH: 3.16 (ref 0.41–5.90)

## 2019-02-12 DIAGNOSIS — Z23 Encounter for immunization: Secondary | ICD-10-CM | POA: Diagnosis not present

## 2019-02-13 ENCOUNTER — Encounter (INDEPENDENT_AMBULATORY_CARE_PROVIDER_SITE_OTHER): Payer: Medicare Other | Admitting: Ophthalmology

## 2019-02-16 NOTE — Progress Notes (Signed)
[facility] Note  02/23/2019     CHIEF COMPLAINT Patient presents for Retina Follow Up   HISTORY OF PRESENT ILLNESS: Vanessa [name] is a 86 y.o. female who presents to the clinic today for:   HPI    Retina Follow Up    Patient presents with  CRVO/BRVO.  In right eye.  This started months ago.  Severity is severe.  Duration of 3 months.  Since onset it is stable.  I, the attending physician,  performed the HPI with the patient and updated documentation appropriately.          Comments    86 y/o female pt here for 3 mo f/u for CRVO w/mac edema OD.  No change in [place] OU noticed; possibly slightly worse OD.  Denies pain, flashes, floaters.  No gtts.       Last edited by Bernarda Caffey, MD on 02/23/2019  4:51 PM. (History)    Pt states  Referring physician: [name], MD [place],  [place] [id number]  HISTORICAL INFORMATION:   Selected notes from the MEDICAL RECORD NUMBER Referred by Dr. [name] for concern of CME LEE: 01.21.20 (R. Groat) [BCVA: OD: OS:] Ocular Hx- PMH-    CURRENT MEDICATIONS: Current Outpatient Medications (Ophthalmic Drugs)  Medication Sig  . Propylene Glycol (SYSTANE BALANCE) 0.6 % SOLN Give 1 drop to both eyes once daily as needed for dryness   No current facility-administered medications for this visit. (Ophthalmic Drugs)   Current Outpatient Medications (Other)  Medication Sig  . acetaminophen (TYLENOL) 325 MG tablet Take 650 mg by mouth every 8 (eight) hours as needed.  Marland Kitchen acetaminophen (TYLENOL) 500 MG tablet Take 500 mg by mouth 2 (two) times daily.   . Artificial Saliva (SALIVAMAX) PACK Use as directed 1 Package in the mouth or throat. mix with 33m's of H20; swish and spit BID; may keep in bathroom; may self administer.  .Marland Kitchenaspirin 81 MG chewable tablet Chew 81 mg by mouth daily.  .Marland Kitchenatenolol (TENORMIN) 25 MG tablet Take 12.5 mg by mouth 2 (two) times daily. Hold if SBP < 100 or HR < 60  .  Biotin 5000 MCG TABS Take 5,000 mcg by mouth daily.  . famotidine (PEPCID) 20 MG tablet Take 20 mg by mouth daily.  . fluticasone (FLONASE) 50 MCG/ACT nasal spray Place 2 sprays into both nostrils daily.  . hydrogen peroxide 1.5 % SOLN Use 15 ml orally, swish and spit as needed for dry mouth.  .Marland Kitchenipratropium (ATROVENT) 0.06 % nasal spray Place 2 sprays into both nostrils 2 (two) times daily as needed for rhinitis.  .Prudencio Burly(VByars CGrawnby mouth. Take 1 in the morning.  . lactose free nutrition (BOOST PLUS) LIQD Take 237 mLs by mouth 2 (two) times daily with a meal. Per request of resident, states can't eat if takes it later   . lidocaine-prilocaine (EMLA) cream Apply 1 application topically daily as needed.  . loratadine (CLARITIN) 10 MG tablet Take 10 mg by mouth daily as needed for allergies.  . Menthol, Topical Analgesic, (BIOFREEZE ROLL-ON) 4 % GEL Apply 1 application topically every 8 (eight) hours. Apply to LLE  . Multiple Vitamins-Minerals (MULTIVITAL) tablet Take 1 tablet by mouth daily.  . ondansetron (ZOFRAN) 4 MG tablet Take 4 mg by mouth every 6 (six) hours as needed for nausea or vomiting.  .Marland KitchenPeppermint Oil (IBGARD) 90 MG CPCR Take 2 capsules by  mouth 2 (two) times daily.   . polyethylene glycol (MIRALAX / GLYCOLAX) packet Take 17 g daily as needed by mouth (for constipation).   . pregabalin (LYRICA) 25 MG capsule Take one capsule by mouth once daily every morning  . pregabalin (LYRICA) 50 MG capsule Take 1 capsule (50 mg total) by mouth at bedtime.  . sennosides-docusate sodium (SENOKOT-S) 8.6-50 MG tablet Take 2 tablets by mouth daily.   . sodium phosphate (FLEET) 7-19 GM/118ML ENEM Place 1 enema rectally as needed for severe constipation. Give Fleet enema per rectum X 1 after removing hard stool or if soft stool is present in rectum.  . traMADol (ULTRAM) 50 MG tablet Take 50 mg by mouth daily. As needed  . traMADol (ULTRAM) 50 MG tablet Take 1  tablet (50 mg total) by mouth 2 (two) times daily.  Marland Kitchen triamcinolone cream (KENALOG) 0.1 % Apply 1 application topically as needed. Apply to itchy areas as needed. May keep in room and self apply   No current facility-administered medications for this visit. (Other)      REVIEW OF SYSTEMS: ROS    Positive for: Gastrointestinal, Neurological, Musculoskeletal, Cardiovascular, Eyes   Negative for: Constitutional, Skin, Genitourinary, HENT, Endocrine, Respiratory, Psychiatric, Allergic/Imm, Heme/Lymph   Last edited by Matthew Folks, COA on 02/23/2019  1:40 PM. (History)       ALLERGIES No Known Allergies  PAST MEDICAL HISTORY Past Medical History:  Diagnosis Date  . Abdominal bloating   . Atrial fibrillation ([place])   . Bruises easily   . Carotid artery occlusion    LEFT  . Dizziness   . DOE (dyspnea on exertion) 04/02/2014  . Fall at home Sept 2013, Dec. 2013  Jun 08, 2012  . GERD (gastroesophageal reflux disease) 02/11/2015  . Headache(784.0)   . Hoarseness   . Hypercholesterolemia   . Hyperglycemia 04/02/2014   Glucose 178 mg percent on 01/22/2014 04/05/14 Hgb A1c 6.6 Diet controlled.    . Hypertensive retinopathy    OU  . Hypothyroidism 04/15/2015  . Neuropathy    PERIPHERAL  . Pruritus   . Scoliosis   . Varicose veins    Past Surgical History:  Procedure Laterality Date  . ABDOMINAL HYSTERECTOMY  1954  . CATARACT EXTRACTION Bilateral   . CHOLECYSTECTOMY  1997  . ESOPHAGOGASTRODUODENOSCOPY (EGD) WITH PROPOFOL N/A 11/25/2016   Procedure: ESOPHAGOGASTRODUODENOSCOPY (EGD) WITH PROPOFOL;  Surgeon: [name], MD;  Location: [facility];  Service: Endoscopy;  Laterality: N/A;  . EYE SURGERY Bilateral    Cat Sx  . SPINE SURGERY  1997   correct scoliosis    FAMILY HISTORY Family History  Adopted: Yes  Problem Relation Age of Onset  . Diabetes Mother   . Heart attack Mother   . Heart disease Mother        After age 8  . Heart attack Father   . Stroke  Father   . Breast cancer Sister   . Heart disease Maternal Grandmother   . Cancer Son        adopted son. esophagus, stomach, liver  . Stomach cancer Neg Hx   . Colon cancer Neg Hx     SOCIAL HISTORY Social History   Tobacco Use  . Smoking status: Never Smoker  . Smokeless tobacco: Never Used  Substance Use Topics  . Alcohol use: No    Alcohol/week: 0.0 standard drinks  . Drug use: No         OPHTHALMIC EXAM:  Base Eye Exam  Visual Acuity (Snellen - Linear)      Right Left   Dist cc CF @ 3' 20/20 -2   Dist ph cc NI    Correction: Glasses       Tonometry (Tonopen, 1:45 PM)      Right Left   Pressure 17 15       Pupils      Dark Light Shape React APD   Right 2 1 Round Minimal None   Left 2 1 Round Minimal None       Visual Fields (Counting fingers)      Left Right    Full Full       Extraocular Movement      Right Left    Full, Ortho Full, Ortho       Neuro/Psych    Oriented x3: Yes   Mood/Affect: Normal       Dilation    Both eyes: 1.0% Mydriacyl, 2.5% Phenylephrine @ 1:44 PM        Slit Lamp and Fundus Exam    Slit Lamp Exam      Right Left   Lids/Lashes Dermatochalasis - upper lid Dermatochalasis - upper lid, Meibomian gland dysfunction   Conjunctiva/Sclera White and quiet White and quiet   Cornea Arcus, 1-2+ diffuse Punctate epithelial erosions Arcus, 2+ diffuse Punctate epithelial erosions   Anterior Chamber deep and clear deep and clear   Iris Round and moderately dilated to 33m Round and moderately dilated to 543m  Lens PC IOL in good position with open PC PC IOL in good position with open PC   Vitreous Vitreous syneresis Vitreous syneresis       Fundus Exam      Right Left   Disc Mild pallor, sharp, + hyperemia, vascular loops superiorly Pink and Sharp   C/D Ratio 0.4 0.3   Macula Massive recurrent central CME, Blunted foveal reflex, RPE mottling and clumping, diffuse IRH Flat, Blunted foveal reflex, Drusen, RPE mottling and  clumping, No heme or edema   Vessels Attenuated, tortuous, reactivation of CRVO Vascular attenuation, Tortuous   Periphery Attached, choroidal nevus at 0930 equator w/ overlying drusen, minimal elavation, no orange pigment, 3DD in diam, 360 DBH - worsened Attached, reticular degeneration, peripheral cystoid degeneration          IMAGING AND PROCEDURES  Imaging and Procedures for _0 @  OCT, Retina - OU - Both Eyes       Right Eye Quality was good. Central Foveal Thickness: 1203. Progression has worsened. Findings include retinal drusen , inner retinal atrophy, intraretinal hyper-reflective material, abnormal foveal contour, subretinal fluid, intraretinal fluid (Massive interval recurrence of CME.).   Left Eye Quality was good. Central Foveal Thickness: 224. Progression has been stable. Findings include normal foveal contour, no SRF, no IRF, retinal drusen , outer retinal atrophy (Mild patchy ORA; no significant change from prior).   Notes *Images captured and stored on drive  Diagnosis / Impression:  OD: CRVO w/ massive interval recurrence of CME. OS: No significant change from prior. non-exu ARMD OU   Clinical management:  See below  Abbreviations: NFP - Normal foveal profile. CME - cystoid macular edema. PED - pigment epithelial detachment. IRF - intraretinal fluid. SRF - subretinal fluid. EZ - ellipsoid zone. ERM - epiretinal membrane. ORA - outer retinal atrophy. ORT - outer retinal tubulation. SRHM - subretinal hyper-reflective material        Intravitreal Injection, Pharmacologic Agent - OD - Right Eye  Time Out 02/23/2019. 1:47 PM. Confirmed correct patient, procedure, site, and patient consented.   Anesthesia Topical anesthesia was used. Anesthetic medications included Lidocaine 2%, Proparacaine 0.5%.   Procedure Preparation included 5% betadine to ocular surface, eyelid speculum. A supplied needle was used.   Injection:  1.25 mg Bevacizumab (AVASTIN)  SOLN   NDC: 84166-063-01, Lot: [id number] , Expiration date: 03/28/2019   Route: Intravitreal, Site: Right Eye, Waste: 0 mL  Post-op Post injection exam found visual acuity of at least counting fingers. The patient tolerated the procedure well. There were no complications. The patient received written and verbal post procedure care education.        Color Fundus Photography Optos - OU - Both Eyes       Right Eye Progression has been stable. Disc findings include hemorrhage, pallor. Macula : hemorrhage, edema. Vessels : tortuous vessels, full vessels. Periphery : hemorrhage, RPE abnormality.   Left Eye Progression has been stable. Disc findings include normal observations. Macula : normal observations. Vessels : attenuated, tortuous vessels. Periphery : RPE abnormality.   Notes **Images stored on drive**  Impression: OD: choroidal nevus along distal ST arcades--stable from prior; CRVO w/ +CME OS:  Mild vascular tortuosity                 ASSESSMENT/PLAN:    ICD-10-CM   1. Central retinal vein occlusion with macular edema of right eye  H34.8110 Intravitreal Injection, Pharmacologic Agent - OD - Right Eye    Bevacizumab (AVASTIN) SOLN 1.25 mg  2. Retinal edema  H35.81 OCT, Retina - OU - Both Eyes  3. Intermediate stage nonexudative age-related macular degeneration of both eyes  H35.3132   4. Essential hypertension  I10   5. Hypertensive retinopathy of both eyes  H35.033   6. Nevus of choroid of right eye  D31.31 Color Fundus Photography Optos - OU - Both Eyes  7. Pseudophakia of both eyes  Z96.1     1,2. CRVO with CME OD  - s/p IVA OD #1 (01.22.20), #2 (02.18.20), #3 (04.22.20),#4 (05.20.20), #5 (06.24.20), #6 (08.04.20), #7 (09.08.20)  - FA 2.19.2020 shows delayed filling time OD, no active leakage -- ?RAO component  - BCVA OD: stable at CF  - OCT shows massive recurrence of CME OD -- 5 mos since last IVA OD; persistent ORA  - recommend IVA OD #8 today  (2.12.2021)  - RBA of procedure discussed, questions answered  - see procedure note  - informed consent for Avastin signed and scanned on 9.8.2020 (OD)  - F/U 4-6 wks, sooner prn -- DFE/OCT/possible injection  3. Age related macular degeneration, non-exudative, intermediate stage OU  - The incidence, anatomy, and pathology of dry AMD, risk of progression, and the AREDS and AREDS 2 study including smoking risks discussed with patient.  - recommend amsler grid monitoring  - monitor  4,5. Hypertensive retinopathy OU  - discussed importance of tight BP control  - monitor  6. Choroidal Nevus OD  - ~3DD in diameter, round, located at 0930 equator  - +drusen; no orange pigment or SRF  - minimal elevation  - baseline Optos imaging obtained 2.19.2020  - optos imaging repeated today 2.14.2021  - stable today  - monitor  7. Pseudophakia OU  - s/p CE/IOL OU  - beautiful surgeries, doing well  - monitor   Ophthalmic Meds Ordered this visit:  Meds ordered this encounter  Medications  . Bevacizumab (AVASTIN) SOLN 1.25 mg       Return in about 4 weeks (  around 03/23/2019) for DFE, OCT, Poss. FA.  There are no Patient Instructions on file for this visit.   Explained the diagnoses, plan, and follow up with the patient and they expressed understanding.  Patient expressed understanding of the importance of proper follow up care.   This document serves as a record of services personally performed by Gardiner Sleeper, MD, PhD. It was created on their behalf by Ernest Mallick, OA, an ophthalmic assistant. The creation of this record is the provider's dictation and/or activities during the visit.    Electronically signed by: Ernest Mallick, OA 02.05.2021 12:51 PM   Gardiner Sleeper, M.D., Ph.D. Diseases & Surgery of the Retina and Vitreous [facility]  I have reviewed the above documentation for accuracy and completeness, and I agree with the above. Gardiner Sleeper, M.D.,  Ph.D. 02/25/19 12:51 PM   Abbreviations: M myopia (nearsighted); A astigmatism; H hyperopia (farsighted); P presbyopia; Mrx spectacle prescription;  CTL contact lenses; OD right eye; OS left eye; OU both eyes  XT exotropia; ET esotropia; PEK punctate epithelial keratitis; PEE punctate epithelial erosions; DES dry eye syndrome; MGD meibomian gland dysfunction; ATs artificial tears; PFAT's preservative free artificial tears; [place] nuclear sclerotic cataract; PSC posterior subcapsular cataract; ERM epi-retinal membrane; PVD posterior vitreous detachment; RD retinal detachment; DM diabetes mellitus; DR diabetic retinopathy; NPDR non-proliferative diabetic retinopathy; PDR proliferative diabetic retinopathy; CSME clinically significant macular edema; DME diabetic macular edema; dbh dot blot hemorrhages; CWS cotton wool spot; POAG primary open angle glaucoma; C/D cup-to-disc ratio; HVF humphrey visual field; GVF goldmann visual field; OCT optical coherence tomography; IOP intraocular pressure; BRVO Branch retinal vein occlusion; CRVO central retinal vein occlusion; CRAO central retinal artery occlusion; BRAO branch retinal artery occlusion; RT retinal tear; SB scleral buckle; PPV pars plana vitrectomy; VH Vitreous hemorrhage; PRP panretinal laser photocoagulation; IVK intravitreal kenalog; VMT vitreomacular traction; MH Macular hole;  NVD neovascularization of the disc; NVE neovascularization elsewhere; AREDS age related eye disease study; ARMD age related macular degeneration; POAG primary open angle glaucoma; EBMD epithelial/anterior basement membrane dystrophy; ACIOL anterior chamber intraocular lens; IOL intraocular lens; PCIOL posterior chamber intraocular lens; Phaco/IOL phacoemulsification with intraocular lens placement; [place] photorefractive keratectomy; LASIK laser assisted in situ keratomileusis; HTN hypertension; DM diabetes mellitus; COPD chronic obstructive pulmonary disease

## 2019-02-19 ENCOUNTER — Encounter (INDEPENDENT_AMBULATORY_CARE_PROVIDER_SITE_OTHER): Payer: Medicare Other | Admitting: Ophthalmology

## 2019-02-23 ENCOUNTER — Ambulatory Visit (INDEPENDENT_AMBULATORY_CARE_PROVIDER_SITE_OTHER): Payer: Medicare Other | Admitting: Ophthalmology

## 2019-02-23 ENCOUNTER — Encounter (INDEPENDENT_AMBULATORY_CARE_PROVIDER_SITE_OTHER): Payer: Self-pay | Admitting: Ophthalmology

## 2019-02-23 DIAGNOSIS — D3131 Benign neoplasm of right choroid: Secondary | ICD-10-CM | POA: Diagnosis not present

## 2019-02-23 DIAGNOSIS — H35033 Hypertensive retinopathy, bilateral: Secondary | ICD-10-CM | POA: Diagnosis not present

## 2019-02-23 DIAGNOSIS — Z961 Presence of intraocular lens: Secondary | ICD-10-CM

## 2019-02-23 DIAGNOSIS — H3581 Retinal edema: Secondary | ICD-10-CM

## 2019-02-23 DIAGNOSIS — H353132 Nonexudative age-related macular degeneration, bilateral, intermediate dry stage: Secondary | ICD-10-CM | POA: Diagnosis not present

## 2019-02-23 DIAGNOSIS — H34811 Central retinal vein occlusion, right eye, with macular edema: Secondary | ICD-10-CM

## 2019-02-23 DIAGNOSIS — I10 Essential (primary) hypertension: Secondary | ICD-10-CM

## 2019-02-25 MED ORDER — BEVACIZUMAB CHEMO INJECTION 1.25MG/0.05ML SYRINGE FOR KALEIDOSCOPE
1.2500 mg | INTRAVITREAL | Status: AC | PRN
  Administered 2019-02-25: 12:00:00 1.25 mg via INTRAVITREAL

## 2019-03-14 ENCOUNTER — Other Ambulatory Visit: Payer: Self-pay | Admitting: *Deleted

## 2019-03-14 MED ORDER — TRAMADOL HCL 50 MG PO TABS: 50.0000 mg | ORAL_TABLET | Freq: Two times a day (BID) | ORAL | 0 refills | Status: DC

## 2019-03-14 MED ORDER — PREGABALIN 25 MG PO CAPS: ORAL_CAPSULE | ORAL | 0 refills | Status: DC

## 2019-03-14 MED ORDER — PREGABALIN 50 MG PO CAPS: 50.0000 mg | ORAL_CAPSULE | Freq: Every day | ORAL | 0 refills | Status: DC

## 2019-03-14 NOTE — Telephone Encounter (Signed)
Received fax from [facility] and sent to Dr. Lyndel Safe for approval.

## 2019-03-29 ENCOUNTER — Non-Acute Institutional Stay: Payer: Medicare Other | Admitting: Internal Medicine

## 2019-03-29 ENCOUNTER — Encounter: Payer: Self-pay | Admitting: Internal Medicine

## 2019-03-29 ENCOUNTER — Ambulatory Visit: Payer: Medicare Other | Admitting: Neurology

## 2019-03-29 DIAGNOSIS — M8949 Other hypertrophic osteoarthropathy, multiple sites: Secondary | ICD-10-CM

## 2019-03-29 DIAGNOSIS — I4891 Unspecified atrial fibrillation: Secondary | ICD-10-CM | POA: Diagnosis not present

## 2019-03-29 DIAGNOSIS — G629 Polyneuropathy, unspecified: Secondary | ICD-10-CM

## 2019-03-29 DIAGNOSIS — M159 Polyosteoarthritis, unspecified: Secondary | ICD-10-CM

## 2019-03-29 DIAGNOSIS — K5901 Slow transit constipation: Secondary | ICD-10-CM | POA: Diagnosis not present

## 2019-03-29 LAB — T4, FREE: Free T4: 0.8

## 2019-03-29 NOTE — Progress Notes (Signed)
Location:   [facility] Room Number: 1 Place of Service:  ALF [phone]) Provider:  [name], MD  [name], MD  Patient Care Team: [name], MD as PCP - General (Internal Medicine) Angelia Mould, MD as Attending Physician (Vascular Surgery) [name], MD (Inactive) as Attending Physician (Obstetrics and Gynecology) [name], MD (Orthopedic Surgery) [name], MD (Ophthalmology) [name], MD as Consulting Physician (Urology) [name], MD as Consulting Physician (Cardiology) [name], PA-C as Physician Assistant (Otolaryngology) [name], MD as Consulting Physician (Otolaryngology) [name], MD as Consulting Physician (Dermatology) [name], MD as Consulting Physician (Physical Medicine and Rehabilitation) [name], MD as Attending Physician (Gastroenterology) Mast, Man X, NP as Nurse Practitioner (Internal Medicine) Kathrynn Ducking, MD as Consulting Physician (Neurology) Debara Pickett Nadean Corwin, MD as Consulting Physician (Cardiology) Druscilla Brownie, MD as Consulting Physician (Dermatology) [name], [name], NP as Nurse Practitioner (Family Medicine)  Extended Emergency Contact Information Primary Emergency Contact: [name] of [place] Phone: [phone] Mobile Phone: [phone] Relation: Son Secondary Emergency Contact: [name] Mobile Phone: [phone] Relation: Friend  Code Status:  Full Code Goals of care: Advanced Directive information Advanced Directives 03/29/2019  Does Patient Have a Medical Advance Directive? Yes  Type of Advance Directive [place]  Does patient want to make changes to medical advance directive? No - Patient declined  Copy of [place] in Chart? Yes - validated most recent copy scanned in chart (See row information)  Would patient like information on creating a medical advance  directive? -     Chief Complaint  Patient presents with  . Medical Management of Chronic Issues    HPI:  Pt is a 86 y.o. female seen today for medical management of chronic diseases.    Patient has h/ochronic atrial fibrillation not on any coagulation, GERD, hypothyroidism, hyperlipidemia, neuropathy,, cognitive impairment, H/o Syncope,  h/o IBS and Esophageal Stenosis. She also Has H/o Retinal Vein Occlusion with Edema S/P Vitreal Injections  Active issues   IBS with Constipation Sees GI. C/o Constipation now. D/W her about taking laxatives  Retinal Vein Occlusion  Cannot see from her Right eye She says she does want to go for her injections anymore Dysphonia Has seen Dr Benjamine Mola before  Neuropathy On Lyrica by Dr Jannifer Franklin   Past Medical History:  Diagnosis Date  . Abdominal bloating   . Atrial fibrillation ([place])   . Bruises easily   . Carotid artery occlusion    LEFT  . Dizziness   . DOE (dyspnea on exertion) 04/02/2014  . Fall at home Sept 2013, Dec. 2013  Jun 08, 2012  . GERD (gastroesophageal reflux disease) 02/11/2015  . Headache(784.0)   . Hoarseness   . Hypercholesterolemia   . Hyperglycemia 04/02/2014   Glucose 178 mg percent on 01/22/2014 04/05/14 Hgb A1c 6.6 Diet controlled.    . Hypertensive retinopathy    OU  . Hypothyroidism 04/15/2015  . Neuropathy    PERIPHERAL  . Pruritus   . Scoliosis   . Varicose veins    Past Surgical History:  Procedure Laterality Date  . ABDOMINAL HYSTERECTOMY  1954  . CATARACT EXTRACTION Bilateral   . CHOLECYSTECTOMY  1997  . ESOPHAGOGASTRODUODENOSCOPY (EGD) WITH PROPOFOL N/A 11/25/2016   Procedure: ESOPHAGOGASTRODUODENOSCOPY (EGD) WITH PROPOFOL;  Surgeon: [name], MD;  Location: [facility];  Service: Endoscopy;  Laterality: N/A;  . EYE SURGERY Bilateral    Cat Sx  .  SPINE SURGERY  1997   correct scoliosis    No Known Allergies  Allergies as of 03/29/2019   No Known Allergies     Medication List        Accurate as of March 29, 2019 10:40 AM. If you have any questions, ask your nurse or doctor.        STOP taking these medications   Multivital tablet Stopped by: [name], MD     TAKE these medications   acetaminophen 500 MG tablet Commonly known as: TYLENOL Take 500 mg by mouth 2 (two) times daily.   acetaminophen 325 MG tablet Commonly known as: TYLENOL Take 650 mg by mouth every 8 (eight) hours as needed.   aspirin 81 MG chewable tablet Chew 81 mg by mouth daily.   atenolol 25 MG tablet Commonly known as: TENORMIN Take 12.5 mg by mouth 2 (two) times daily. Hold if SBP < 100 or HR < 60   Biofreeze Roll-On 4 % Gel Generic drug: Menthol (Topical Analgesic) Apply 1 application topically every 8 (eight) hours. Apply to LLE   Biotin 5000 MCG Tabs Take 5,000 mcg by mouth daily.   famotidine 20 MG tablet Commonly known as: PEPCID Take 20 mg by mouth daily.   fluticasone 50 MCG/ACT nasal spray Commonly known as: FLONASE Place 2 sprays into both nostrils daily.   hydrogen peroxide 1.5 % Soln Use 15 ml orally, swish and spit as needed for dry mouth.   IBgard 90 MG Cpcr Generic drug: Peppermint Oil Take 2 capsules by mouth 2 (two) times daily.   ipratropium 0.06 % nasal spray Commonly known as: ATROVENT Place 2 sprays into both nostrils 2 (two) times daily as needed for rhinitis.   lactose free nutrition Liqd Take 237 mLs by mouth 2 (two) times daily with a meal. Per request of resident, states can't eat if takes it later   lidocaine-prilocaine cream Commonly known as: EMLA Apply 1 application topically daily as needed.   loratadine 10 MG tablet Commonly known as: CLARITIN Take 10 mg by mouth daily as needed for allergies.   ondansetron 4 MG tablet Commonly known as: ZOFRAN Take 4 mg by mouth every 6 (six) hours as needed for nausea or vomiting.   polyethylene glycol 17 g packet Commonly known as: MIRALAX / GLYCOLAX Take 17 g daily as needed by mouth  (for constipation).   pregabalin 25 MG capsule Commonly known as: Lyrica Take one capsule by mouth once daily every morning   pregabalin 50 MG capsule Commonly known as: LYRICA Take 1 capsule (50 mg total) by mouth at bedtime.   SalivaMAX Pack Use as directed 1 Package in the mouth or throat. mix with 50mL's of H20; swish and spit BID; may keep in bathroom; may self administer.   sennosides-docusate sodium 8.6-50 MG tablet Commonly known as: SENOKOT-S Take 2 tablets by mouth daily.   sodium phosphate 7-19 GM/118ML Enem Place 1 enema rectally as needed for severe constipation. Give Fleet enema per rectum X 1 after removing hard stool or if soft stool is present in rectum.   Systane Balance 0.6 % Soln Generic drug: Propylene Glycol Give 1 drop to both eyes once daily as needed for dryness   traMADol 50 MG tablet Commonly known as: ULTRAM Take 50 mg by mouth daily. As needed   traMADol 50 MG tablet Commonly known as: ULTRAM Take 1 tablet (50 mg total) by mouth 2 (two) times daily.   triamcinolone cream 0.1 % Commonly  known as: KENALOG Apply 1 application topically as needed. Apply to itchy areas as needed. May keep in room and self apply   [facility] by mouth. Take 1 in the morning.       Review of Systems  Review of Systems  Constitutional: Negative for activity change, appetite change, chills, diaphoresis, fatigue and fever.  HENT: Negative for mouth sores, postnasal drip, rhinorrhea, sinus pain and sore throat.   Respiratory: Negative for apnea, cough, chest tightness, shortness of breath and wheezing.   Cardiovascular: Negative for chest pain, palpitations and leg swelling.  Gastrointestinal: Negative for abdominal distention, abdominal pain,  diarrhea, nausea and vomiting.  Genitourinary: Negative for dysuria and frequency.  Musculoskeletal: Negative for arthralgias, joint swelling and myalgias.  Skin: Negative for rash.  Neurological:  Negative for dizziness, syncope, weakness, light-headedness and numbness.  Psychiatric/Behavioral: Negative for behavioral problems, confusion and sleep disturbance.     Immunization History  Administered Date(s) Administered  . Influenza Split 10/20/2011, 11/03/2012  . Influenza, High Dose Seasonal PF 10/24/2018  . Influenza,inj,Quad PF,6+ Mos 09/29/2012, 11/09/2013  . Influenza-Unspecified 10/17/2014, 10/23/2015, 10/20/2016, 10/17/2017  . Moderna SARS-COVID-2 Vaccination 01/15/2019, 02/12/2019  . PPD Test 08/21/2013, 03/04/2014  . Pneumococcal Conjugate-13 08/21/2013  . Pneumococcal Polysaccharide-23 09/08/2016  . Tdap 05/20/2008  . Tetanus 11/02/2018  . Zoster 11/01/2012   Pertinent  Health Maintenance Due  Topic Date Due  . INFLUENZA VACCINE  Completed  . DEXA SCAN  Completed  . PNA vac Low Risk Adult  Completed   Fall Risk  11/04/2017 09/05/2017 09/22/2016 08/25/2016 05/31/2016  Falls in the past year? Yes Yes Yes Yes No  Number falls in past yr: 2 or more 2 or more 1 2 or more -  Comment - - - - -  Injury with Fall? Yes Yes Yes Yes -  Comment - - fx 3 ribs - -  Risk Factor Category  - - - - -  Risk for fall due to : - - Impaired mobility;Impaired balance/gait - -  Risk for fall due to: Comment - - - - -   Functional Status Survey:    Vitals:   03/29/19 1031  BP: 120/66  Pulse: 87  Resp: 20  Temp: (!) 97.3 F (36.3 C)  SpO2: 98%  Weight: 102 lb (46.3 kg)  Height: 5\' 2"  (1.575 m)   Body mass index is 18.66 kg/m. Physical Exam  Constitutional: Oriented to person, place, and time. Well-developed and well-nourished.  HENT:  Head: Normocephalic.  Mouth/Throat: Oropharynx is clear and moist.  Eyes: Pupils are equal, round, and reactive to light.  Neck: Neck supple.  Cardiovascular: Normal rate and normal heart sounds.  No murmur heard.Rhythm irregular Pulmonary/Chest: Effort normal and breath sounds normal. No respiratory distress. No wheezes. She has no rales.    Abdominal: Soft. Bowel sounds are normal. No distension. There is no tenderness. There is no rebound.  Musculoskeletal: No edema.  Lymphadenopathy: none Neurological: Alert and oriented to person, place, and time.  Skin: Skin is warm and dry.  Psychiatric: Normal mood and affect. Behavior is normal. Thought content normal.    Labs reviewed: Recent Labs    03/30/18 1252 04/06/18 0000 12/28/18 0000  NA 134* 138 139  K 4.3 4.6 4.6  CL 100  --  105  CO2 26  --  29*  GLUCOSE 109*  --   --   BUN 16 17 17   CREATININE 0.93 0.9 0.8  CALCIUM 9.7  --  9.0  Recent Labs    03/30/18 1252 12/28/18 0000  AST 30 23  ALT 16 13  ALKPHOS 77 68  BILITOT 0.6  --   PROT 6.2*  --   ALBUMIN 3.6 3.5   Recent Labs    03/30/18 1252 04/06/18 0000 12/28/18 0000  WBC 8.1  --  6.2  NEUTROABS  --   --  3,398  HGB 13.2 13.2 12.5  HCT 40.1 39 36  MCV 102.6*  --   --   PLT 196 200 202   Lab Results  Component Value Date   TSH 3.16 02/08/2019   Lab Results  Component Value Date   HGBA1C 5.8 12/28/2018   Lab Results  Component Value Date   CHOL 187 11/03/2017   HDL 84 (A) 11/03/2017   LDLCALC 80 11/03/2017   TRIG 136 11/03/2017   CHOLHDL 2.1 07/29/2016    Significant Diagnostic Results in last 30 days:  No results found.  Assessment/Plan Atrial fibrillation with RVR (HCC) On Low dose of Lopressor Not on anticoagulation due to frailty and GI bleed Neuropathy Symptoms are stable on Lyrica follows with neurology  Mild cognitive impairment Stays in AL.  Supportive care weight is stable Constipation Follows with GI but has been doing okay on IBgard and MiraLAX Retinal Vein Oclusion with Macular edema Follows with ophthalmology States does not have good vision in 1 eye Arthritis On tramadol as needed  Dysphonia Will follow with ENT   Family/ staff Communication:   Labs/tests ordered:

## 2019-03-30 ENCOUNTER — Encounter: Payer: Self-pay | Admitting: Nurse Practitioner

## 2019-03-30 ENCOUNTER — Encounter (HOSPITAL_COMMUNITY): Payer: Self-pay

## 2019-03-30 ENCOUNTER — Non-Acute Institutional Stay: Payer: Medicare Other | Admitting: Nurse Practitioner

## 2019-03-30 ENCOUNTER — Emergency Department (HOSPITAL_COMMUNITY): Payer: Medicare Other

## 2019-03-30 ENCOUNTER — Encounter (HOSPITAL_COMMUNITY)
Admission: EM | Disposition: A | Discharge status: Skilled Nursing Facility | Payer: Self-pay | Source: Home / Self Care | Attending: Emergency Medicine

## 2019-03-30 ENCOUNTER — Other Ambulatory Visit: Payer: Self-pay

## 2019-03-30 ENCOUNTER — Emergency Department (HOSPITAL_COMMUNITY)
Admission: EM | Admit: 2019-03-30 | Discharge: 2019-03-31 | Disposition: A | Discharge status: Skilled Nursing Facility | Payer: Medicare Other | Attending: Emergency Medicine | Admitting: Emergency Medicine

## 2019-03-30 DIAGNOSIS — Z79899 Other long term (current) drug therapy: Secondary | ICD-10-CM | POA: Diagnosis not present

## 2019-03-30 DIAGNOSIS — I4891 Unspecified atrial fibrillation: Secondary | ICD-10-CM | POA: Diagnosis not present

## 2019-03-30 DIAGNOSIS — J90 Pleural effusion, not elsewhere classified: Secondary | ICD-10-CM | POA: Diagnosis not present

## 2019-03-30 DIAGNOSIS — Z20822 Contact with and (suspected) exposure to covid-19: Secondary | ICD-10-CM | POA: Insufficient documentation

## 2019-03-30 DIAGNOSIS — R1310 Dysphagia, unspecified: Secondary | ICD-10-CM

## 2019-03-30 DIAGNOSIS — T18128A Food in esophagus causing other injury, initial encounter: Secondary | ICD-10-CM | POA: Insufficient documentation

## 2019-03-30 DIAGNOSIS — G629 Polyneuropathy, unspecified: Secondary | ICD-10-CM | POA: Insufficient documentation

## 2019-03-30 DIAGNOSIS — X58XXXA Exposure to other specified factors, initial encounter: Secondary | ICD-10-CM | POA: Insufficient documentation

## 2019-03-30 DIAGNOSIS — R0902 Hypoxemia: Secondary | ICD-10-CM | POA: Diagnosis not present

## 2019-03-30 DIAGNOSIS — Z7982 Long term (current) use of aspirin: Secondary | ICD-10-CM | POA: Insufficient documentation

## 2019-03-30 DIAGNOSIS — Z03818 Encounter for observation for suspected exposure to other biological agents ruled out: Secondary | ICD-10-CM | POA: Diagnosis not present

## 2019-03-30 DIAGNOSIS — Y92128 Other place in nursing home as the place of occurrence of the external cause: Secondary | ICD-10-CM | POA: Insufficient documentation

## 2019-03-30 DIAGNOSIS — K224 Dyskinesia of esophagus: Secondary | ICD-10-CM

## 2019-03-30 DIAGNOSIS — M1990 Unspecified osteoarthritis, unspecified site: Secondary | ICD-10-CM | POA: Diagnosis not present

## 2019-03-30 DIAGNOSIS — K219 Gastro-esophageal reflux disease without esophagitis: Secondary | ICD-10-CM | POA: Insufficient documentation

## 2019-03-30 DIAGNOSIS — K117 Disturbances of salivary secretion: Secondary | ICD-10-CM | POA: Diagnosis not present

## 2019-03-30 DIAGNOSIS — K222 Esophageal obstruction: Secondary | ICD-10-CM | POA: Diagnosis not present

## 2019-03-30 DIAGNOSIS — T17308A Unspecified foreign body in larynx causing other injury, initial encounter: Secondary | ICD-10-CM

## 2019-03-30 DIAGNOSIS — M159 Polyosteoarthritis, unspecified: Secondary | ICD-10-CM | POA: Diagnosis not present

## 2019-03-30 DIAGNOSIS — R05 Cough: Secondary | ICD-10-CM | POA: Diagnosis not present

## 2019-03-30 DIAGNOSIS — R1319 Other dysphagia: Secondary | ICD-10-CM

## 2019-03-30 HISTORY — PX: ESOPHAGOGASTRODUODENOSCOPY: SHX5428

## 2019-03-30 LAB — RESPIRATORY PANEL BY RT PCR (FLU A&B, COVID)
Influenza A by PCR: NEGATIVE
Influenza B by PCR: NEGATIVE
SARS Coronavirus 2 by RT PCR: NEGATIVE

## 2019-03-30 SURGERY — EGD (ESOPHAGOGASTRODUODENOSCOPY)
Anesthesia: Moderate Sedation

## 2019-03-30 MED ORDER — ONDANSETRON HCL 4 MG/2ML IJ SOLN
4.0000 mg | Freq: Once | INTRAMUSCULAR | Status: AC
  Administered 2019-03-30: 4 mg via INTRAVENOUS
  Filled 2019-03-30: qty 2

## 2019-03-30 MED ORDER — FENTANYL CITRATE (PF) 100 MCG/2ML IJ SOLN
INTRAMUSCULAR | Status: DC | PRN
  Administered 2019-03-30: 12.5 ug via INTRAVENOUS

## 2019-03-30 MED ORDER — MIDAZOLAM HCL (PF) 5 MG/ML IJ SOLN
INTRAMUSCULAR | Status: AC
  Filled 2019-03-30: qty 2

## 2019-03-30 MED ORDER — FENTANYL CITRATE (PF) 100 MCG/2ML IJ SOLN
INTRAMUSCULAR | Status: AC
  Filled 2019-03-30: qty 4

## 2019-03-30 MED ORDER — MIDAZOLAM HCL 2 MG/2ML IJ SOLN
1.0000 mg | Freq: Once | INTRAMUSCULAR | Status: DC
  Filled 2019-03-30: qty 2

## 2019-03-30 MED ORDER — MIDAZOLAM HCL (PF) 10 MG/2ML IJ SOLN
INTRAMUSCULAR | Status: DC | PRN
  Administered 2019-03-30: 1 mg via INTRAVENOUS

## 2019-03-30 MED ORDER — GLUCAGON HCL RDNA (DIAGNOSTIC) 1 MG IJ SOLR
1.0000 mg | Freq: Once | INTRAMUSCULAR | Status: AC
  Administered 2019-03-30: 1 mg via INTRAVENOUS
  Filled 2019-03-30: qty 1

## 2019-03-30 MED ORDER — ETOMIDATE 2 MG/ML IV SOLN
10.0000 mg | Freq: Once | INTRAVENOUS | Status: AC
  Administered 2019-03-30: 10 mg via INTRAVENOUS
  Filled 2019-03-30: qty 10

## 2019-03-30 NOTE — ED Notes (Signed)
GI at bedside for EGD procedure

## 2019-03-30 NOTE — ED Triage Notes (Signed)
Pt BIB EMS from [facility]. Pt had choking incident, food lodged in her throat. Per EMS pt was verbal and oriented. Upon arrival to room patient is gargling and, per EMS, pt seems to be trying to cough up something.

## 2019-03-30 NOTE — Assessment & Plan Note (Addendum)
Chronic lower back pain, continue Tylenol, Tramadol, Lyrica

## 2019-03-30 NOTE — ED Notes (Signed)
Pt lying in bed. MD at bedside for EGD. Pt denies any needs. Full monitor on.

## 2019-03-30 NOTE — ED Provider Notes (Signed)
[facility] DEPT Provider Note   CSN: [id number] Arrival date & time: 03/30/19  1542     History No chief complaint on file.   Vanessa [name] is a 86 y.o. female.  Pt presents to the ED today with aspiration of food.  Pt ate lunch and choked on a piece of lettuce.  Her O2 sats did not go down.  Staff at the SNF looked and did not see anything.  They gave her some soda which did not go down.  Pt does have a hx of GERD, dysphagia, and esophageal stricture.  She has seen Dr. Ardis Hughs Vel[facility] Heckler) in the past.         Past Medical History:  Diagnosis Date  . Abdominal bloating   . Atrial fibrillation ([place])   . Bruises easily   . Carotid artery occlusion    LEFT  . Dizziness   . DOE (dyspnea on exertion) 04/02/2014  . Fall at home Sept 2013, Dec. 2013  Jun 08, 2012  . GERD (gastroesophageal reflux disease) 02/11/2015  . Headache(784.0)   . Hoarseness   . Hypercholesterolemia   . Hyperglycemia 04/02/2014   Glucose 178 mg percent on 01/22/2014 04/05/14 Hgb A1c 6.6 Diet controlled.    . Hypertensive retinopathy    OU  . Hypothyroidism 04/15/2015  . Neuropathy    PERIPHERAL  . Pruritus   . Scoliosis   . Varicose veins     Patient Active Problem List   Diagnosis Date Noted  . Choking 03/30/2019  . Cricopharyngeus muscle dysfunction   . Acute bronchitis 02/01/2019  . Candidiasis of mouth 02/01/2019  . Allergic rhinitis 02/01/2019  . Oral inflammation 12/15/2018  . Mild cognitive impairment 08/01/2017  . Leg mass, left 04/01/2017  . Moderate protein-calorie malnutrition ([place]) 12/27/2016  . Closed fracture of nasal bones   . Severe protein-calorie malnutrition ([place])   . Atrial fibrillation with RVR ([place]) 12/02/2016  . Esophageal stenosis   . History of rib fracture 08/04/2016  . Age-related osteoporosis without current pathological fracture 08/04/2016  . Unsteady gait 04/19/2016  . Urinary frequency 12/09/2015  . OCD (obsessive compulsive  disorder) 09/02/2015  . Hypothyroidism 04/15/2015  . GERD (gastroesophageal reflux disease) 02/11/2015  . Constipation 02/11/2015  . Cricopharyngeal hypertrophy 08/27/2014  . 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 Chronic lower back pain 08/13/2014  . Carotid stenosis 08/13/2014  . Xerostomia 06/18/2014  . Tingling sensation-Left Leg 06/05/2014  . Hyperglycemia 04/02/2014  . Idiopathic scoliosis 04/02/2014  . Hearing loss 04/02/2014  . Dysphagia 04/02/2014  . Seborrheic keratoses, inflamed 04/02/2014  . Weakness 09/12/2013  . Anxiety state 09/12/2013  . GI bleed 09/10/2013  . Other and unspecified ovarian cyst 09/10/2013  . PVD (peripheral vascular disease) ([place]) 02/21/2013  . Occlusion of left internal carotid artery 08/16/2012  . Varicose veins 12/22/2011  . Raynaud phenomenon 06/26/2010  . Osteoarthritis 06/26/2010  . Neuropathy   . Hyperlipidemia   . Pruritus     Past Surgical History:  Procedure Laterality Date  . ABDOMINAL HYSTERECTOMY  1954  . CATARACT EXTRACTION Bilateral   . CHOLECYSTECTOMY  1997  . ESOPHAGOGASTRODUODENOSCOPY (EGD) WITH PROPOFOL N/A 11/25/2016   Procedure: ESOPHAGOGASTRODUODENOSCOPY (EGD) WITH PROPOFOL;  Surgeon: [name], MD;  Location: [facility] ENDOSCOPY;  Service: Endoscopy;  Laterality: N/A;  . EYE SURGERY Bilateral    Cat Sx  . SPINE SURGERY  1997   correct scoliosis     OB History   No obstetric history on file.  Family History  Adopted: Yes  Problem Relation Age of Onset  . Diabetes Mother   . Heart attack Mother   . Heart disease Mother        After age 13  . Heart attack Father   . Stroke Father   . Breast cancer Sister   . Heart disease Maternal Grandmother   . Cancer Son        adopted son. esophagus, stomach, liver  . Stomach cancer Neg Hx   . Colon cancer Neg Hx     Social History   Tobacco Use  . Smoking status: Never Smoker  . Smokeless tobacco: Never Used  Substance Use Topics  . Alcohol use: No     Alcohol/week: 0.0 standard drinks  . Drug use: No    Home Medications Prior to Admission medications   Medication Sig Start Date End Date Taking? Authorizing Provider  acetaminophen (TYLENOL) 325 MG tablet Take 650 mg by mouth every 8 (eight) hours as needed.    [provider]  acetaminophen (TYLENOL) 500 MG tablet Take 500 mg by mouth 2 (two) times daily.     [provider]  Artificial Saliva [facility]) PACK Use as directed 1 Package in the mouth or throat. mix with 52mL's of H20; swish and spit BID; may keep in bathroom; may self administer.    [provider]  aspirin 81 MG chewable tablet Chew 81 mg by mouth daily.    [provider]  atenolol (TENORMIN) 25 MG tablet Take 12.5 mg by mouth 2 (two) times daily. Hold if SBP < 100 or HR < 60    [provider]  Biotin 5000 MCG TABS Take 5,000 mcg by mouth daily.    [provider]  famotidine (PEPCID) 20 MG tablet Take 20 mg by mouth daily.    [provider]  fluticasone (FLONASE) 50 MCG/ACT nasal spray Place 2 sprays into both nostrils daily.    [provider]  hydrogen peroxide 1.5 % SOLN Use 15 ml orally, swish and spit as needed for dry mouth.    [provider]  ipratropium (ATROVENT) 0.06 % nasal spray Place 2 sprays into both nostrils 2 (two) times daily as needed for rhinitis.    [provider]  Lactobacillus-Inulin ([place]) [place] by mouth. Take 1 in the morning. 02/28/18   [provider]  lactose free nutrition (BOOST PLUS) LIQD Take 237 mLs by mouth 2 (two) times daily with a meal. Per request of resident, states can't eat if takes it later     [provider]  lidocaine-prilocaine (EMLA) cream Apply 1 application topically daily as needed.    [provider]  loratadine (CLARITIN) 10 MG tablet Take 10 mg by mouth daily as needed for allergies.    [provider]  Menthol, Topical  Analgesic, (BIOFREEZE ROLL-ON) 4 % GEL Apply 1 application topically every 8 (eight) hours. Apply to LLE    [provider]  ondansetron (ZOFRAN) 4 MG tablet Take 4 mg by mouth every 6 (six) hours as needed for nausea or vomiting.    [provider]  Peppermint Oil (IBGARD) 90 MG CPCR Take 2 capsules by mouth 2 (two) times daily.  09/26/18   [provider]  polyethylene glycol (MIRALAX / GLYCOLAX) packet Take 17 g daily as needed by mouth (for constipation).     [provider]  pregabalin (LYRICA) 25 MG capsule Take one capsule by mouth once daily  every morning 03/14/19   [name], MD  pregabalin (LYRICA) 50 MG capsule Take 1 capsule (50 mg total) by mouth at bedtime. 03/14/19   [name], MD  Propylene Glycol (SYSTANE BALANCE) 0.6 % SOLN Give 1 drop to both eyes once daily as needed for dryness    [provider]  sennosides-docusate sodium (SENOKOT-S) 8.6-50 MG tablet Take 2 tablets by mouth daily.     [provider]  sodium phosphate (FLEET) 7-19 GM/118ML ENEM Place 1 enema rectally as needed for severe constipation. Give Fleet enema per rectum X 1 after removing hard stool or if soft stool is present in rectum.    [provider]  traMADol (ULTRAM) 50 MG tablet Take 50 mg by mouth daily. As needed    [provider]  traMADol (ULTRAM) 50 MG tablet Take 1 tablet (50 mg total) by mouth 2 (two) times daily. 03/14/19   [name], MD  triamcinolone cream (KENALOG) 0.1 % Apply 1 application topically as needed. Apply to itchy areas as needed. May keep in room and self apply    [provider]    Allergies    Patient has no known allergies.  Review of Systems   Review of Systems  Gastrointestinal:       Difficulty swallowing  All other systems reviewed and are negative.   Physical Exam Updated Vital Signs BP 123/74   Pulse 64   Temp 98.2 F (36.8 C) (Oral)   Resp 13   Ht 5\' 2"  (1.575 m)   Wt  46 kg   SpO2 96%   BMI 18.55 kg/m   Physical Exam Vitals and nursing note reviewed.  Constitutional:      Appearance: Normal appearance.  HENT:     Head: Normocephalic and atraumatic.     Right Ear: External ear normal.     Left Ear: External ear normal.     Nose: Nose normal.     Mouth/Throat:     Mouth: Mucous membranes are moist.     Pharynx: Oropharynx is clear.  Eyes:     Extraocular Movements: Extraocular movements intact.     Conjunctiva/sclera: Conjunctivae normal.     Pupils: Pupils are equal, round, and reactive to light.  Cardiovascular:     Rate and Rhythm: Normal rate and regular rhythm.     Pulses: Normal pulses.     Heart sounds: Normal heart sounds.  Pulmonary:     Effort: Pulmonary effort is normal.     Breath sounds: Normal breath sounds.  Abdominal:     General: Abdomen is flat. Bowel sounds are normal.     Palpations: Abdomen is soft.  Musculoskeletal:        General: Normal range of motion.     Cervical back: Normal range of motion and neck supple.  Skin:    General: Skin is warm.     Capillary Refill: Capillary refill takes less than 2 seconds.  Neurological:     General: No focal deficit present.     Mental Status: She is alert and oriented to person, place, and time.  Psychiatric:        Mood and Affect: Mood normal.        Behavior: Behavior normal.     ED Results / Procedures / Treatments   Labs (all labs ordered are listed, but only abnormal results are displayed) Labs Reviewed  RESPIRATORY PANEL BY RT PCR (FLU A&B, COVID)    EKG None  Radiology DG Chest Portable 1 View  Result Date: 03/30/2019 CLINICAL DATA:  Aspiration EXAM: PORTABLE CHEST 1 VIEW COMPARISON:  03/30/2018 FINDINGS: Stable cardiomediastinal contours. Calcific aortic knob. Hyperexpanded lungs with coarsened interstitial markings compatible with COPD. Trace left pleural effusion. No focal airspace consolidation. No pneumothorax. Multiple chronic left-sided rib  fractures. Scoliotic curvature. IMPRESSION: Trace left pleural effusion. No focal airspace consolidation. Electronically Signed   By: Davina Poke D.O.   On: 03/30/2019 16:48    Procedures .Sedation  Date/Time: 03/30/2019 6:12 PM Performed by: Isla Pence, MD Authorized by: Isla Pence, MD   Consent:    Consent obtained:  Verbal   Consent given by:  Patient Universal protocol:    Immediately prior to procedure a time out was called: yes   Pre-sedation assessment:    Time since last food or drink:  5   ASA classification: class 2 - patient with mild systemic disease     Mallampati score:  I - soft palate, uvula, fauces, pillars visible   Pre-sedation assessments completed and reviewed: airway patency, cardiovascular function, hydration status, mental status, nausea/vomiting, pain level, respiratory function and temperature   Immediate pre-procedure details:    Reassessment: Patient reassessed immediately prior to procedure   Procedure details (see MAR for exact dosages):    Preoxygenation:  Room air   Sedation:  Etomidate   Intended level of sedation: deep   Intra-procedure monitoring:  Blood pressure monitoring, cardiac monitor, continuous capnometry, continuous pulse oximetry, frequent LOC assessments and frequent vital sign checks   Intra-procedure events: none     Total Provider sedation time (minutes):  30 Post-procedure details:    Patient tolerance:  Tolerated well, no immediate complications .Foreign Body Removal  Date/Time: 03/30/2019 6:13 PM Performed by: Isla Pence, MD Authorized by: Isla Pence, MD  Consent: Verbal consent obtained. Body area: throat Anesthesia: see MAR for details Post-procedure assessment: foreign body not removed Patient tolerance: patient tolerated the procedure well with no immediate complications Comments: Direct laryngoscopy done to make sure pt did not have a FB above the vocal cords.  None visualized.   (including  critical care time)  Medications Ordered in ED Medications  midazolam (VERSED) injection 1 mg (1 mg Intravenous Not Given 03/30/19 1732)  etomidate (AMIDATE) injection 10 mg (10 mg Intravenous Given 03/30/19 1716)  glucagon (human recombinant) (GLUCAGEN) injection 1 mg (1 mg Intravenous Given by Other 03/30/19 1731)  ondansetron (ZOFRAN) injection 4 mg (4 mg Intravenous Given 03/30/19 1732)    ED Course  I have reviewed the triage vital signs and the nursing notes.  Pertinent labs & imaging results that were available during my care of the patient were reviewed by me and considered in my medical decision making (see chart for details).    MDM Rules/Calculators/A&P                      I suspected pt's esophageal impaction was proximal, so I took a look with the glide scope.  No FB in airway.  Pt given 1 mg of glucagon which did not help sx.  Pt d/w Dr. Hilarie Fredrickson ([facility]) who did come see pt.  He was able to do an endoscopy, identify the food impaction, and push it into the stomach.    Pt tolerated the EGD well.   I called pt's son to update him.  CRITICAL CARE Performed by: Isla Pence   Total critical care time: 30 minutes  Critical care time was exclusive of separately  billable procedures and treating other patients.  Critical care was necessary to treat or prevent imminent or life-threatening deterioration.  Critical care was time spent personally by me on the following activities: development of treatment plan with patient and/or surrogate as well as nursing, discussions with consultants, evaluation of patient's response to treatment, examination of patient, obtaining history from patient or surrogate, ordering and performing treatments and interventions, ordering and review of laboratory studies, ordering and review of radiographic studies, pulse oximetry and re-evaluation of patient's condition.  Final Clinical Impression(s) / ED Diagnoses Final diagnoses:  Esophageal  obstruction due to food impaction  Esophageal stricture    Rx / DC Orders ED Discharge Orders    None       Isla Pence, MD 03/30/19 2244

## 2019-03-30 NOTE — Op Note (Signed)
[facility] Patient Name: Vanessa [name] Procedure Date: 03/30/2019 MRN: [id number] Attending MD: Jerene Bears , MD Date of Birth: [birth year] CSN: [id number] Age: 86 Admit Type: Emergency Department Procedure:                Upper GI endoscopy Indications:              Dysphagia, Foreign body in the esophagus Providers:                Lajuan Lines. Hilarie Fredrickson, MD, Jeanella Cara, RN, Cherylynn Ridges, Technician Referring MD:             Isla Pence, MD Medicines:                Fentanyl 12.5 micrograms IV, Midazolam 1 mg IV Complications:            No immediate complications. Estimated Blood Loss:     Estimated blood loss: none. Procedure:                Pre-Anesthesia Assessment:                           - Prior to the procedure, a History and Physical                            was performed, and patient medications and                            allergies were reviewed. The patient's tolerance of                            previous anesthesia was also reviewed. The risks                            and benefits of the procedure and the sedation                            options and risks were discussed with the patient.                            All questions were answered, and informed consent                            was obtained. Prior Anticoagulants: The patient has                            taken no previous anticoagulant or antiplatelet                            agents. ASA Grade Assessment: III - A patient with                            severe systemic disease. After reviewing the risks  and benefits, the patient was deemed in                            satisfactory condition to undergo the procedure.                           After obtaining informed consent, the endoscope was                            passed under direct vision. Throughout the                            procedure, the patient's  blood pressure, pulse, and                            oxygen saturations were monitored continuously. The                            [id number]) Olympus ultra slim endoscope                            was introduced through the mouth, and advanced to                            the duodenal bulb. The upper GI endoscopy was                            accomplished without difficulty. The patient                            tolerated the procedure well. Scope In: Scope Out: Findings:      Two benign-appearing, intrinsic moderate (circumferential scarring or       stenosis; an endoscope may pass) stenoses were found at the       cricopharyngeus and GE junction. The stenoses were traversed with the       pediatric endoscope.      The esophagus was otherwise without abnormality.      There was a food bolus in the proximal stomach, which likely migrated       with intubation of the esophagus. The stomach was otherwise normal.      The examined duodenum was normal. Impression:               - Benign-appearing esophageal stenoses at                            cricopharyngeus and GE junction.                           - Normal stomach.                           - Normal examined duodenum.                           - No specimens collected. Moderate Sedation:      Moderate (conscious)  sedation was administered by the endoscopy nurse       and supervised by the endoscopist. The following parameters were       monitored: oxygen saturation, heart rate, blood pressure, and response       to care. Total physician intraservice time was 8 minutes. Recommendation:           - Patient has a contact number available for                            emergencies. The signs and symptoms of potential                            delayed complications were discussed with the                            patient. Return to normal activities tomorrow.                            Written discharge instructions  were provided to the                            patient.                           - Soft diet with continued input from                            speech/swallow therapy at her living facility.                           - Continue present medications.                           - The findings and recommendations were discussed                            with the patient's son by phone after the EGD. Procedure Code(s):        --- Professional ---                           [phone], Esophagogastroduodenoscopy, flexible,                            transoral; diagnostic, including collection of                            specimen(s) by brushing or washing, when performed                            (separate procedure) Diagnosis Code(s):        --- Professional ---                           K22.2, Esophageal obstruction  R13.10, Dysphagia, unspecified                           T18.108A, Unspecified foreign body in esophagus                            causing other injury, initial encounter CPT copyright 2019 [employer]. All rights reserved. The codes documented in this report are preliminary and upon coder review may  be revised to meet current compliance requirements. Jerene Bears, MD 03/30/2019 10:27:08 PM This report has been signed electronically. Number of Addenda: 0

## 2019-03-30 NOTE — Assessment & Plan Note (Signed)
08/09/14 Esophagogram and Ba swallow: tight cricopharyngeus, esophageal dysmotility, and GERD. Narrow lumen and difficulty passing a barium tablet. 12/10/16 mech soft, chopped meat, thin liquids. -

## 2019-03-30 NOTE — ED Notes (Signed)
Pt doing well after procedure. Will continue to monitor.

## 2019-03-30 NOTE — Progress Notes (Signed)
During sedation procedure Co2 reading ranged from 20-23.

## 2019-03-30 NOTE — ED Notes (Signed)
Pt lying in bed awake. Updated on plan of care. Full monitor on. NAD noted.

## 2019-03-30 NOTE — Assessment & Plan Note (Addendum)
Unable to dislodge the piece of lettuce in throat, ED eval and tx. Blood pressure is elevated related to the event.

## 2019-03-30 NOTE — Progress Notes (Signed)
Location:   [facility] Room Number: 1 Place of Service:  ALF [phone]) Provider: Marlana Latus NP  [name], MD  Patient Care Team: [name], MD as PCP - General (Internal Medicine) Angelia Mould, MD as Attending Physician (Vascular Surgery) [name], MD (Inactive) as Attending Physician (Obstetrics and Gynecology) [name], MD (Orthopedic Surgery) [name], MD (Ophthalmology) [name], MD as Consulting Physician (Urology) [name], MD as Consulting Physician (Cardiology) [name], PA-C as Physician Assistant (Otolaryngology) [name], MD as Consulting Physician (Otolaryngology) [name], MD as Consulting Physician (Dermatology) [name], MD as Consulting Physician (Physical Medicine and Rehabilitation) [name], MD as Attending Physician (Gastroenterology) Iyonnah Ferrante X, NP as Nurse Practitioner (Internal Medicine) Kathrynn Ducking, MD as Consulting Physician (Neurology) Debara Pickett Nadean Corwin, MD as Consulting Physician (Cardiology) Druscilla Brownie, MD as Consulting Physician (Dermatology) [name], [name], NP as Nurse Practitioner (Family Medicine)  Extended Emergency Contact Information Primary Emergency Contact: [name] of [place] Phone: [phone] Mobile Phone: [phone] Relation: Son Secondary Emergency Contact: [name] Mobile Phone: [phone] Relation: Friend  Code Status:  DNR Goals of care: Advanced Directive information Advanced Directives 03/30/2019  Does Patient Have a Medical Advance Directive? Yes  Type of Advance Directive [place]  Does patient want to make changes to medical advance directive? No - Patient declined  Copy of [place] in Chart? Yes - validated most recent copy scanned in chart (See row information)  Would patient like information on creating a medical advance directive? -      Chief Complaint  Patient presents with  . Acute Visit    Choking    HPI:  Pt is a 86 y.o. female seen today for an acute visit for choking on a piece of lettuce during lunch, she stated it feels like it stuck to her throat, unable to swallow down or cough up, unable see it with tongue depressor in the back of throat upon my examination, attempted swallowing  pudding, cake, or soda to dislodge it with no success. The patient denied pain in throat or chest, able to speak, no O2 desaturation.   Hx of dry mouth, uses artifical saliva, GERD, taking Famotidine 20mg  qd, lower back pain, taking Tramadol 50mg  bid, Lyrica 50mg  hs, 25mg  am, Tylenol 500mg  bid. Afib, heart rate is controlled on Atenolol 12.5mg  bid. Hx of dysphagia, esophageal stricture.    Past Medical History:  Diagnosis Date  . Abdominal bloating   . Atrial fibrillation ([place])   . Bruises easily   . Carotid artery occlusion    LEFT  . Dizziness   . DOE (dyspnea on exertion) 04/02/2014  . Fall at home Sept 2013, Dec. 2013  Jun 08, 2012  . GERD (gastroesophageal reflux disease) 02/11/2015  . Headache(784.0)   . Hoarseness   . Hypercholesterolemia   . Hyperglycemia 04/02/2014   Glucose 178 mg percent on 01/22/2014 04/05/14 Hgb A1c 6.6 Diet controlled.    . Hypertensive retinopathy    OU  . Hypothyroidism 04/15/2015  . Neuropathy    PERIPHERAL  . Pruritus   . Scoliosis   . Varicose veins    Past Surgical History:  Procedure Laterality Date  . ABDOMINAL HYSTERECTOMY  1954  . CATARACT EXTRACTION Bilateral   . CHOLECYSTECTOMY  1997  . ESOPHAGOGASTRODUODENOSCOPY (EGD) WITH PROPOFOL N/A 11/25/2016   Procedure: ESOPHAGOGASTRODUODENOSCOPY (EGD) WITH PROPOFOL;  Surgeon: [name], MD;  Location: [facility];  Service: Endoscopy;  Laterality: N/A;  . EYE SURGERY Bilateral    Cat Sx  . SPINE SURGERY  1997   correct scoliosis    No Known Allergies  Allergies as of 03/30/2019   No Known Allergies     Medication  List       Accurate as of March 30, 2019  4:28 PM. If you have any questions, ask your nurse or doctor.        acetaminophen 500 MG tablet Commonly known as: TYLENOL Take 500 mg by mouth 2 (two) times daily.   acetaminophen 325 MG tablet Commonly known as: TYLENOL Take 650 mg by mouth every 8 (eight) hours as needed.   aspirin 81 MG chewable tablet Chew 81 mg by mouth daily.   atenolol 25 MG tablet Commonly known as: TENORMIN Take 12.5 mg by mouth 2 (two) times daily. Hold if SBP < 100 or HR < 60   Biofreeze Roll-On 4 % Gel Generic drug: Menthol (Topical Analgesic) Apply 1 application topically every 8 (eight) hours. Apply to LLE   Biotin 5000 MCG Tabs Take 5,000 mcg by mouth daily.   famotidine 20 MG tablet Commonly known as: PEPCID Take 20 mg by mouth daily.   fluticasone 50 MCG/ACT nasal spray Commonly known as: FLONASE Place 2 sprays into both nostrils daily.   hydrogen peroxide 1.5 % Soln Use 15 ml orally, swish and spit as needed for dry mouth.   IBgard 90 MG Cpcr Generic drug: Peppermint Oil Take 2 capsules by mouth 2 (two) times daily.   ipratropium 0.06 % nasal spray Commonly known as: ATROVENT Place 2 sprays into both nostrils 2 (two) times daily as needed for rhinitis.   lactose free nutrition Liqd Take 237 mLs by mouth 2 (two) times daily with a meal. Per request of resident, states can't eat if takes it later   lidocaine-prilocaine cream Commonly known as: EMLA Apply 1 application topically daily as needed.   loratadine 10 MG tablet Commonly known as: CLARITIN Take 10 mg by mouth daily as needed for allergies.   ondansetron 4 MG tablet Commonly known as: ZOFRAN Take 4 mg by mouth every 6 (six) hours as needed for nausea or vomiting.   polyethylene glycol 17 g packet Commonly known as: MIRALAX / GLYCOLAX Take 17 g daily as needed by mouth (for constipation).   pregabalin 25 MG capsule Commonly known as: Lyrica Take one capsule by mouth  once daily every morning   pregabalin 50 MG capsule Commonly known as: LYRICA Take 1 capsule (50 mg total) by mouth at bedtime.   SalivaMAX Pack Use as directed 1 Package in the mouth or throat. mix with 70mL's of H20; swish and spit BID; may keep in bathroom; may self administer.   sennosides-docusate sodium 8.6-50 MG tablet Commonly known as: SENOKOT-S Take 2 tablets by mouth daily.   sodium phosphate 7-19 GM/118ML Enem Place 1 enema rectally as needed for severe constipation. Give Fleet enema per rectum X 1 after removing hard stool or if soft stool is present in rectum.   Systane Balance 0.6 % Soln Generic drug: Propylene Glycol Give 1 drop to both eyes once daily as needed for dryness   traMADol 50 MG tablet Commonly known as: ULTRAM Take 50 mg by mouth daily. As needed   traMADol 50 MG tablet Commonly known as: ULTRAM Take 1 tablet (50 mg total) by mouth 2 (two) times daily.   triamcinolone cream 0.1 % Commonly known as: KENALOG Apply 1  application topically as needed. Apply to itchy areas as needed. May keep in room and self apply   [facility] by mouth. Take 1 in the morning.       Review of Systems  Constitutional: Positive for activity change. Negative for appetite change, chills, diaphoresis, fatigue and fever.  HENT: Positive for hearing loss and sore throat. Negative for congestion, facial swelling, rhinorrhea, trouble swallowing and voice change.   Eyes: Negative for visual disturbance.  Respiratory: Positive for cough. Negative for chest tightness, shortness of breath and wheezing.        C/o yellow phlegm production for a few days.   Cardiovascular: Negative for chest pain, palpitations and leg swelling.  Gastrointestinal: Negative for abdominal distention, abdominal pain, constipation, diarrhea, nausea and vomiting.       Gagging.   Musculoskeletal: Positive for arthralgias, back pain and gait problem.  Skin: Negative for color  change and pallor.  Neurological: Negative for dizziness, tremors, seizures, facial asymmetry, speech difficulty, weakness and headaches.  Psychiatric/Behavioral: Negative for agitation and sleep disturbance. The patient is not nervous/anxious.     Immunization History  Administered Date(s) Administered  . Influenza Split 10/20/2011, 11/03/2012  . Influenza, High Dose Seasonal PF 10/24/2018  . Influenza,inj,Quad PF,6+ Mos 09/29/2012, 11/09/2013  . Influenza-Unspecified 10/17/2014, 10/23/2015, 10/20/2016, 10/17/2017  . Moderna SARS-COVID-2 Vaccination 01/15/2019, 02/12/2019  . PPD Test 08/21/2013, 03/04/2014  . Pneumococcal Conjugate-13 08/21/2013  . Pneumococcal Polysaccharide-23 09/08/2016  . Tdap 05/20/2008  . Tetanus 11/02/2018  . Zoster 11/01/2012   Pertinent  Health Maintenance Due  Topic Date Due  . INFLUENZA VACCINE  Completed  . DEXA SCAN  Completed  . PNA vac Low Risk Adult  Completed   Fall Risk  11/04/2017 09/05/2017 09/22/2016 08/25/2016 05/31/2016  Falls in the past year? Yes Yes Yes Yes No  Number falls in past yr: 2 or more 2 or more 1 2 or more -  Comment - - - - -  Injury with Fall? Yes Yes Yes Yes -  Comment - - fx 3 ribs - -  Risk Factor Category  - - - - -  Risk for fall due to : - - Impaired mobility;Impaired balance/gait - -  Risk for fall due to: Comment - - - - -   Functional Status Survey:    Vitals:   03/30/19 1531  BP: (!) 193/103  Pulse: 66  Resp: 20  Temp: (!) 97.1 F (36.2 C)  SpO2: 100%  Weight: 102 lb (46.3 kg)  Height: 5\' 2"  (1.575 m)   Body mass index is 18.66 kg/m. Physical Exam Vitals and nursing note reviewed.  Constitutional:      General: She is in acute distress.     Appearance: She is not diaphoretic.  HENT:     Head: Normocephalic and atraumatic.     Nose: Nose normal. No congestion or rhinorrhea.     Mouth/Throat:     Comments: Unable to see the piece of lettuce stuck in her throat.  Eyes:     Extraocular Movements:  Extraocular movements intact.     Conjunctiva/sclera: Conjunctivae normal.     Pupils: Pupils are equal, round, and reactive to light.  Cardiovascular:     Rate and Rhythm: Normal rate. Rhythm irregular.     Heart sounds: No murmur.  Pulmonary:     Effort: Pulmonary effort is normal.     Breath sounds: No wheezing, rhonchi or rales.  Abdominal:     General: Bowel  sounds are normal. There is no distension.     Palpations: Abdomen is soft.     Tenderness: There is no abdominal tenderness. There is no guarding.  Musculoskeletal:     Cervical back: Normal range of motion and neck supple.     Right lower leg: No edema.     Left lower leg: No edema.  Skin:    General: Skin is warm and dry.  Neurological:     General: No focal deficit present.     Mental Status: She is alert. Mental status is at baseline.     Motor: No weakness.     Coordination: Coordination normal.     Gait: Gait abnormal.     Comments: Oriented to person, place.   Psychiatric:        Mood and Affect: Mood normal.        Behavior: Behavior normal.        Thought Content: Thought content normal.        Judgment: Judgment normal.     Labs reviewed: Recent Labs    04/06/18 0000 12/28/18 0000  NA 138 139  K 4.6 4.6  CL  --  105  CO2  --  29*  BUN 17 17  CREATININE 0.9 0.8  CALCIUM  --  9.0   Recent Labs    12/28/18 0000  AST 23  ALT 13  ALKPHOS 68  ALBUMIN 3.5   Recent Labs    04/06/18 0000 12/28/18 0000  WBC  --  6.2  NEUTROABS  --  3,398  HGB 13.2 12.5  HCT 39 36  PLT 200 202   Lab Results  Component Value Date   TSH 3.16 02/08/2019   Lab Results  Component Value Date   HGBA1C 5.8 12/28/2018   Lab Results  Component Value Date   CHOL 187 11/03/2017   HDL 84 (A) 11/03/2017   LDLCALC 80 11/03/2017   TRIG 136 11/03/2017   CHOLHDL 2.1 07/29/2016    Significant Diagnostic Results in last 30 days:  No results found.  Assessment/Plan Choking Unable to dislodge the piece of  lettuce in throat, ED eval and tx. Blood pressure is elevated related to the event.   Xerostomia Chronic, uses artificial saliva.   GERD (gastroesophageal reflux disease) Stable, continue Famotidine.   Dysphagia 08/09/14 Esophagogram and Ba swallow: tight cricopharyngeus, esophageal dysmotility, and GERD. Narrow lumen and difficulty passing a barium tablet. 12/10/16 mech soft, chopped meat, thin liquids. -   Atrial fibrillation with RVR (HCC) Heart rate is controlled, continue Atenolol.   Osteoarthritis Chronic lower back pain, continue Tylenol, Tramadol, Lyrica    Family/ staff Communication: plan of care reviewed with the patient and charge nurse.   Labs/tests ordered:  none  Time spend 40 minutes

## 2019-03-30 NOTE — Assessment & Plan Note (Signed)
Heart rate is controlled, continue Atenolol.

## 2019-03-30 NOTE — Assessment & Plan Note (Signed)
Stable, continue Famotidine.  

## 2019-03-30 NOTE — Consult Note (Signed)
HPI: Vanessa [name] is a 86 year old female with a past medical history of esophageal dysphagia with hypertonic cricopharyngeus, history of A. fib, hypothyroidism who presents from her nursing facility after trouble swallowing.  She reports she was eating a salad and feels lettuce was stuck in her esophagus.  Unable to swallow liquids or additional food since this time.  She also feels like her voice is more hoarse.  Denies chest pain, dyspnea.  No abdominal pain.  In the ER she was unable to tolerate trial of liquids.  Dr. Gilford Raid gave her a small amount of etomidate and looked with a glide scope and the vocal cords appeared normal and there was no food or debris visible in the upper airway/posterior oropharynx.  Of note in 2018 she had an abnormal barium esophagram showing persistent abnormal laxation of the cricopharyngeus with high-grade narrowing at the thoracic inlet.  No diverticulum or stasis was seen.  Dr. Ardis Hughs attempted EGD in November 2018 but was unable to intubate the esophagus despite attempting with the pediatric and adult upper endoscope.    Past Medical History:  Diagnosis Date  . Abdominal bloating   . Atrial fibrillation ([place])   . Bruises easily   . Carotid artery occlusion    LEFT  . Dizziness   . DOE (dyspnea on exertion) 04/02/2014  . Fall at home Sept 2013, Dec. 2013  Jun 08, 2012  . GERD (gastroesophageal reflux disease) 02/11/2015  . Headache(784.0)   . Hoarseness   . Hypercholesterolemia   . Hyperglycemia 04/02/2014   Glucose 178 mg percent on 01/22/2014 04/05/14 Hgb A1c 6.6 Diet controlled.    . Hypertensive retinopathy    OU  . Hypothyroidism 04/15/2015  . Neuropathy    PERIPHERAL  . Pruritus   . Scoliosis   . Varicose veins     Past Surgical History:  Procedure Laterality Date  . ABDOMINAL HYSTERECTOMY  1954  . CATARACT EXTRACTION Bilateral   . CHOLECYSTECTOMY  1997  . ESOPHAGOGASTRODUODENOSCOPY (EGD) WITH PROPOFOL N/A 11/25/2016   Procedure:  ESOPHAGOGASTRODUODENOSCOPY (EGD) WITH PROPOFOL;  Surgeon: [name], MD;  Location: [facility];  Service: Endoscopy;  Laterality: N/A;  . EYE SURGERY Bilateral    Cat Sx  . SPINE SURGERY  1997   correct scoliosis    (Not in an outpatient encounter)   No Known Allergies  Family History  Adopted: Yes  Problem Relation Age of Onset  . Diabetes Mother   . Heart attack Mother   . Heart disease Mother        After age 19  . Heart attack Father   . Stroke Father   . Breast cancer Sister   . Heart disease Maternal Grandmother   . Cancer Son        adopted son. esophagus, stomach, liver  . Stomach cancer Neg Hx   . Colon cancer Neg Hx     Social History   Tobacco Use  . Smoking status: Never Smoker  . Smokeless tobacco: Never Used  Substance Use Topics  . Alcohol use: No    Alcohol/week: 0.0 standard drinks  . Drug use: No    ROS: As per history of present illness, otherwise negative  BP (!) 173/77   Pulse 63   Temp 98.2 F (36.8 C) (Oral)   Resp 15   SpO2 97%  Gen: awake, alert, NAD HEENT: anicteric, op clear, voice is hoarse CV: RRR, no mrg Pulm: CTA b/l Abd: soft, NT/ND, +BS throughout Ext: no c/c/e Neuro:  nonfocal   RELEVANT LABS AND IMAGING: CBC    Component Value Date/Time   WBC 6.2 12/28/2018 0000   WBC 8.1 03/30/2018 1252   RBC 3.59 (A) 12/28/2018 0000   HGB 12.5 12/28/2018 0000   HGB 11.9 03/07/2017 0000   HCT 36 12/28/2018 0000   HCT 34 03/07/2017 0000   PLT 202 12/28/2018 0000   MCV 102.6 (H) 03/30/2018 1252   MCV 101 (A) 07/27/2011 1149   MCH 33.8 03/30/2018 1252   MCHC 32.9 03/30/2018 1252   RDW 13.5 03/30/2018 1252   LYMPHSABS 1.6 12/02/2016 0511   MONOABS 1.5 (H) 12/02/2016 0511   EOSABS 0.0 12/02/2016 0511   BASOSABS 0.0 12/02/2016 0511    CMP     Component Value Date/Time   NA 139 12/28/2018 0000   NA 140 03/07/2017 0000   K 4.6 12/28/2018 0000   K 4.9 03/07/2017 0000   CL 105 12/28/2018 0000   CL 103 08/04/2017  0000   CO2 29 (A) 12/28/2018 0000   CO2 29 08/04/2017 0000   GLUCOSE 109 (H) 03/30/2018 1252   BUN 17 12/28/2018 0000   CREATININE 0.8 12/28/2018 0000   CREATININE 0.93 03/30/2018 1252   CREATININE 0.77 03/07/2017 0000   CALCIUM 9.0 12/28/2018 0000   CALCIUM 9.3 08/04/2017 0000   PROT 6.2 (L) 03/30/2018 1252   PROT 5.9 08/04/2017 0000   ALBUMIN 3.5 12/28/2018 0000   ALBUMIN 3.3 03/07/2017 0000   AST 23 12/28/2018 0000   AST 22 03/07/2017 0000   ALT 13 12/28/2018 0000   ALT 13 03/07/2017 0000   ALKPHOS 68 12/28/2018 0000   ALKPHOS 71 03/07/2017 0000   BILITOT 0.6 03/30/2018 1252   BILITOT 0.6 03/07/2017 0000   GFRNONAA 52 (L) 03/30/2018 1252   GFRNONAA 66 03/07/2017 0000   GFRAA >60 03/30/2018 1252    ASSESSMENT/PLAN: 86 year old female with a past medical history of esophageal dysphagia with hypertonic cricopharyngeus, history of A. fib, hypothyroidism who presents from her nursing facility after trouble swallowing.  1.  Dysphagia/possible food impaction --I am concerned with her history that she may have food or food particle stuck near the upper esophageal sphincter.  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  Also prior attempted upper endoscopy was unsuccessful.  However, given her inability to swallow I think it is reasonable to attempt visualization with upper endoscopy using the pediatric endoscope.  If it any point during the exam it becomes evident that there is food near the upper esophageal sphincter/airway then I would pause the procedure and have her intubated.  Dr. Gilford Raid has agreed to intubate the patient urgently if felt needed.  I discussed the risks, benefits and alternatives with the patient.  She understands and agrees to proceed.  We will try to start with minimal moderate sedation given her advanced age.  If upper endoscopy is unsuccessful we will likely need to involve ENT.

## 2019-03-30 NOTE — Assessment & Plan Note (Signed)
Chronic, uses artificial saliva.

## 2019-03-31 DIAGNOSIS — T17920A Food in respiratory tract, part unspecified causing asphyxiation, initial encounter: Secondary | ICD-10-CM | POA: Diagnosis not present

## 2019-03-31 DIAGNOSIS — R5381 Other malaise: Secondary | ICD-10-CM | POA: Diagnosis not present

## 2019-03-31 DIAGNOSIS — R279 Unspecified lack of coordination: Secondary | ICD-10-CM | POA: Diagnosis not present

## 2019-03-31 DIAGNOSIS — Z743 Need for continuous supervision: Secondary | ICD-10-CM | POA: Diagnosis not present

## 2019-03-31 NOTE — ED Notes (Signed)
Pt lying in bed, eye's closed, chest rising and falling. NAD noted. Full monitor on.

## 2019-03-31 NOTE — ED Notes (Signed)
Pt sitting up in bed awake and talking. Pt wants to go home. Full monitor on. Will continue to monitor.

## 2019-04-02 DIAGNOSIS — M545 Low back pain: Secondary | ICD-10-CM | POA: Diagnosis not present

## 2019-04-02 DIAGNOSIS — R2681 Unsteadiness on feet: Secondary | ICD-10-CM | POA: Diagnosis not present

## 2019-04-02 DIAGNOSIS — M6281 Muscle weakness (generalized): Secondary | ICD-10-CM | POA: Diagnosis not present

## 2019-04-02 DIAGNOSIS — I482 Chronic atrial fibrillation, unspecified: Secondary | ICD-10-CM | POA: Diagnosis not present

## 2019-04-02 DIAGNOSIS — R1312 Dysphagia, oropharyngeal phase: Secondary | ICD-10-CM | POA: Diagnosis not present

## 2019-04-02 NOTE — Progress Notes (Signed)
[facility] Note  04/03/2019     CHIEF COMPLAINT Patient presents for Retina Follow Up   HISTORY OF PRESENT ILLNESS: Vanessa [name] is a 86 y.o. female who presents to the clinic today for:   HPI    Retina Follow Up    Patient presents with  Other.  In both eyes.  This started 4 weeks ago.  Severity is moderate.  I, the attending physician,  performed the HPI with the patient and updated documentation appropriately.          Comments    Patient here for 4 week retina follow up for DFE. Patient states vision about the same. No eye pain.        Last edited by Bernarda Caffey, MD on 04/03/2019  2:57 PM. (History)    Pt states vision is the same  Referring physician: [name], MD [place],  [place] [phone]  HISTORICAL INFORMATION:   Selected notes from the MEDICAL RECORD NUMBER Referred by Dr. [name] for concern of CME    CURRENT MEDICATIONS: Current Outpatient Medications (Ophthalmic Drugs)  Medication Sig  . Propylene Glycol (SYSTANE BALANCE) 0.6 % SOLN Give 1 drop to both eyes once daily as needed for dryness   No current facility-administered medications for this visit. (Ophthalmic Drugs)   Current Outpatient Medications (Other)  Medication Sig  . acetaminophen (TYLENOL) 325 MG tablet Take 650 mg by mouth every 8 (eight) hours as needed.  Marland Kitchen acetaminophen (TYLENOL) 500 MG tablet Take 500 mg by mouth 2 (two) times daily.   . Artificial Saliva (SALIVAMAX) PACK Use as directed 1 Package in the mouth or throat. mix with 49mL's of H20; swish and spit BID; may keep in bathroom; may self administer.  Marland Kitchen aspirin 81 MG chewable tablet Chew 81 mg by mouth daily.  Marland Kitchen atenolol (TENORMIN) 25 MG tablet Take 12.5 mg by mouth 2 (two) times daily. Hold if SBP < 100 or HR < 60  . Biotin 5000 MCG TABS Take 5,000 mcg by mouth daily.  . famotidine (PEPCID) 20 MG tablet Take 20 mg by mouth daily.  . fluticasone (FLONASE) 50 MCG/ACT  nasal spray Place 2 sprays into both nostrils daily.  . hydrogen peroxide 1.5 % SOLN Use 15 ml orally, swish and spit as needed for dry mouth.  Marland Kitchen ipratropium (ATROVENT) 0.06 % nasal spray Place 2 sprays into both nostrils 2 (two) times daily as needed for rhinitis.  Prudencio Burly ([place]) [place] by mouth. Take 1 in the morning.  . lactose free nutrition (BOOST PLUS) LIQD Take 237 mLs by mouth 2 (two) times daily with a meal. Per request of resident, states can't eat if takes it later   . lidocaine-prilocaine (EMLA) cream Apply 1 application topically daily as needed.  . loratadine (CLARITIN) 10 MG tablet Take 10 mg by mouth daily as needed for allergies.  . Menthol, Topical Analgesic, (BIOFREEZE ROLL-ON) 4 % GEL Apply 1 application topically every 8 (eight) hours. Apply to LLE  . ondansetron (ZOFRAN) 4 MG tablet Take 4 mg by mouth every 6 (six) hours as needed for nausea or vomiting.  Marland Kitchen Peppermint Oil (IBGARD) 90 MG CPCR Take 2 capsules by mouth 2 (two) times daily.   . polyethylene glycol (MIRALAX / GLYCOLAX) packet Take 17 g daily as needed by mouth (for constipation).   . pregabalin (LYRICA) 25 MG capsule Take one capsule by mouth once daily every morning  .  pregabalin (LYRICA) 50 MG capsule Take 1 capsule (50 mg total) by mouth at bedtime.  . sennosides-docusate sodium (SENOKOT-S) 8.6-50 MG tablet Take 2 tablets by mouth daily.   . sodium phosphate (FLEET) 7-19 GM/118ML ENEM Place 1 enema rectally as needed for severe constipation. Give Fleet enema per rectum X 1 after removing hard stool or if soft stool is present in rectum.  . traMADol (ULTRAM) 50 MG tablet Take 50 mg by mouth daily. As needed  . traMADol (ULTRAM) 50 MG tablet Take 1 tablet (50 mg total) by mouth 2 (two) times daily.  Marland Kitchen triamcinolone cream (KENALOG) 0.1 % Apply 1 application topically as needed. Apply to itchy areas as needed. May keep in room and self apply   No current facility-administered  medications for this visit. (Other)      REVIEW OF SYSTEMS: ROS    Positive for: Gastrointestinal, Neurological, Musculoskeletal, Cardiovascular, Eyes   Negative for: Constitutional, Skin, Genitourinary, HENT, Endocrine, Respiratory, Psychiatric, Allergic/Imm, Heme/Lymph   Last edited by Theodore Demark, COA on 04/03/2019  2:42 PM. (History)       ALLERGIES No Known Allergies  PAST MEDICAL HISTORY Past Medical History:  Diagnosis Date  . Abdominal bloating   . Atrial fibrillation ([place])   . Bruises easily   . Carotid artery occlusion    LEFT  . Dizziness   . DOE (dyspnea on exertion) 04/02/2014  . Fall at home Sept 2013, Dec. 2013  Jun 08, 2012  . GERD (gastroesophageal reflux disease) 02/11/2015  . Headache(784.0)   . Hoarseness   . Hypercholesterolemia   . Hyperglycemia 04/02/2014   Glucose 178 mg percent on 01/22/2014 04/05/14 Hgb A1c 6.6 Diet controlled.    . Hypertensive retinopathy    OU  . Hypothyroidism 04/15/2015  . Neuropathy    PERIPHERAL  . Pruritus   . Scoliosis   . Varicose veins    Past Surgical History:  Procedure Laterality Date  . ABDOMINAL HYSTERECTOMY  1954  . CATARACT EXTRACTION Bilateral   . CHOLECYSTECTOMY  1997  . ESOPHAGOGASTRODUODENOSCOPY N/A 03/30/2019   Procedure: ESOPHAGOGASTRODUODENOSCOPY (EGD);  Surgeon: Jerene Bears, MD;  Location: [facility];  Service: Gastroenterology;  Laterality: N/A;  . ESOPHAGOGASTRODUODENOSCOPY (EGD) WITH PROPOFOL N/A 11/25/2016   Procedure: ESOPHAGOGASTRODUODENOSCOPY (EGD) WITH PROPOFOL;  Surgeon: [name], MD;  Location: [facility];  Service: Endoscopy;  Laterality: N/A;  . EYE SURGERY Bilateral    Cat Sx  . SPINE SURGERY  1997   correct scoliosis    FAMILY HISTORY Family History  Adopted: Yes  Problem Relation Age of Onset  . Diabetes Mother   . Heart attack Mother   . Heart disease Mother        After age 24  . Heart attack Father   . Stroke Father   . Breast cancer Sister   . Heart  disease Maternal Grandmother   . Cancer Son        adopted son. esophagus, stomach, liver  . Stomach cancer Neg Hx   . Colon cancer Neg Hx     SOCIAL HISTORY Social History   Tobacco Use  . Smoking status: Never Smoker  . Smokeless tobacco: Never Used  Substance Use Topics  . Alcohol use: No    Alcohol/week: 0.0 standard drinks  . Drug use: No         OPHTHALMIC EXAM:  Base Eye Exam    Visual Acuity (Snellen - Linear)      Right Left   Dist  cc CF at 3' 20/20 -1   Dist ph cc NI        Tonometry (Tonopen, 2:39 PM)      Right Left   Pressure 15 14       Pupils      Dark Light Shape React APD   Right 2 1 Round Minimal None   Left 2 1 Round Minimal None       Visual Fields (Counting fingers)      Left Right    Full Full       Extraocular Movement      Right Left    Full, Ortho Full, Ortho       Neuro/Psych    Oriented x3: Yes   Mood/Affect: Normal       Dilation    Both eyes: 1.0% Mydriacyl @ 2:38 PM        Slit Lamp and Fundus Exam    Slit Lamp Exam      Right Left   Lids/Lashes Dermatochalasis - upper lid Dermatochalasis - upper lid, Meibomian gland dysfunction   Conjunctiva/Sclera White and quiet White and quiet   Cornea Arcus, 1-2+ diffuse Punctate epithelial erosions Arcus, 2+ diffuse Punctate epithelial erosions   Anterior Chamber deep and clear deep and clear   Iris Round and moderately dilated to 45mm Round and moderately dilated to 47mm   Lens PC IOL in good position with open PC PC IOL in good position with open PC   Vitreous Vitreous syneresis Vitreous syneresis       Fundus Exam      Right Left   Disc Mild pallor, sharp, + hyperemia, vascular loops superiorly Pink and Sharp   C/D Ratio 0.4 0.4   Macula Massive improvement central CME, Blunted foveal reflex, RPE mottling and clumping, persistent IRH Flat, Blunted foveal reflex, Drusen, RPE mottling and clumping, No heme or edema   Vessels Attenuated, tortuous Vascular attenuation,  Tortuous   Periphery Attached, choroidal nevus at 0930 equator w/ overlying drusen, minimal elavation, no orange pigment, 3DD in diam, 360 IRH Attached, reticular degeneration, peripheral cystoid degeneration        Refraction    Wearing Rx      Sphere Cylinder Axis Add   Right -2.75 +2.75 010 +3.25   Left -3.75 +3.25 011 +3.25          IMAGING AND PROCEDURES  Imaging and Procedures for @TODAY @  OCT, Retina - OU - Both Eyes       Right Eye Quality was good. Central Foveal Thickness: 187. Progression has improved. Findings include retinal drusen , inner retinal atrophy, intraretinal hyper-reflective material, abnormal foveal contour, subretinal fluid, intraretinal fluid, outer retinal atrophy (Massive interval improvement / almost complete resolution of IRF/CME.).   Left Eye Quality was good. Central Foveal Thickness: 224. Progression has been stable. Findings include normal foveal contour, no SRF, no IRF, retinal drusen , outer retinal atrophy (Mild patchy ORA; no significant change from prior).   Notes *Images captured and stored on drive  Diagnosis / Impression:  non-exu ARMD OU OD: CRVO; Massive interval improvement / almost complete resolution of IRF/CME OS: NFP; no IRF/SRF; No significant change from prior.    Clinical management:  See below  Abbreviations: NFP - Normal foveal profile. CME - cystoid macular edema. PED - pigment epithelial detachment. IRF - intraretinal fluid. SRF - subretinal fluid. EZ - ellipsoid zone. ERM - epiretinal membrane. ORA - outer retinal atrophy. ORT - outer retinal tubulation. SRHM - subretinal  hyper-reflective material        Intravitreal Injection, Pharmacologic Agent - OD - Right Eye       Time Out 04/03/2019. 2:52 PM. Confirmed correct patient, procedure, site, and patient consented.   Anesthesia Topical anesthesia was used. Anesthetic medications included Lidocaine 2%, Proparacaine 0.5%.   Procedure Preparation included  5% betadine to ocular surface, eyelid speculum. A (32g) needle was used.   Injection:  1.25 mg Bevacizumab (AVASTIN) SOLN   NDC: [id number], Lot: [id number] , Expiration date: 06/08/2019   Route: Intravitreal, Site: Right Eye, Waste: 0 mL  Post-op Post injection exam found visual acuity of at least counting fingers. The patient tolerated the procedure well. There were no complications. The patient received written and verbal post procedure care education.                 ASSESSMENT/PLAN:    ICD-10-CM   1. Central retinal vein occlusion with macular edema of right eye  H34.8110 Intravitreal Injection, Pharmacologic Agent - OD - Right Eye    Bevacizumab (AVASTIN) SOLN 1.25 mg  2. Retinal edema  H35.81 OCT, Retina - OU - Both Eyes  3. Intermediate stage nonexudative age-related macular degeneration of both eyes  H35.3132   4. Essential hypertension  I10   5. Hypertensive retinopathy of both eyes  H35.033   6. Nevus of choroid of right eye  D31.31   7. Pseudophakia of both eyes  Z96.1     1,2. CRVO with CME OD  - s/p IVA OD #1 (01.22.20), #2 (02.18.20), #3 (04.22.20),#4 (05.20.20), #5 (06.24.20), #6 (08.04.20), #7 (09.08.20), #8 (2.12.21)  - FA 2.19.2020 shows delayed filling time OD, no active leakage -- ?RAO component  - pt had massive recurrence of CME OD on 2.12.21 -- 5 mos since previous IVA OD; persistent ORA  - today, OCT show massive interval improvement in IRF/CME -- almost complete resolution  - BCVA OD stable at CF  - recommend IVA OD #9 today (3.23.21) for maintenance w/ extension to 5 wks  - RBA of procedure discussed, questions answered  - see procedure note  - informed consent for Avastin signed and scanned on 09.08.2020 (OD)  - F/U 5 wks, sooner prn -- DFE/OCT/possible injection; tx and ext as able  3. Age related macular degeneration, non-exudative, intermediate stage OU  - The incidence, anatomy, and pathology of dry AMD, risk of progression, and the  AREDS and AREDS 2 study including smoking risks discussed with patient.  - recommend amsler grid monitoring  - monitor  4,5. Hypertensive retinopathy OU  - discussed importance of tight BP control  - monitor  6. Choroidal Nevus OD  - ~3DD in diameter, round, located at 0930 equator  - +drusen; no orange pigment or SRF  - minimal elevation  - baseline Optos imaging obtained 2.19.2020  - optos imaging repeated today 2.14.2021  - stable today  - monitor  7. Pseudophakia OU  - s/p CE/IOL OU  - beautiful surgeries, doing well  - monitor   Ophthalmic Meds Ordered this visit:  Meds ordered this encounter  Medications  . Bevacizumab (AVASTIN) SOLN 1.25 mg       Return in about 5 weeks (around 05/08/2019) for f/u CRVO OD, DFE, OCT.  There are no Patient Instructions on file for this visit.   Explained the diagnoses, plan, and follow up with the patient and they expressed understanding.  Patient expressed understanding of the importance of proper follow up care.   This document serves as a  record of services personally performed by Gardiner Sleeper, MD, PhD. It was created on their behalf by Estill Bakes, COT an ophthalmic technician. The creation of this record is the provider's dictation and/or activities during the visit.    Electronically signed by: Estill Bakes, COT 04/02/19 @ 4:15 PM   This document serves as a record of services personally performed by Gardiner Sleeper, MD, PhD. It was created on their behalf by Ernest Mallick, OA, an ophthalmic assistant. The creation of this record is the provider's dictation and/or activities during the visit.    Electronically signed by: Ernest Mallick, OA 03.23.2021 4:15 PM  Gardiner Sleeper, M.D., Ph.D. Diseases & Surgery of the Retina and [place] 04/03/2019   I have reviewed the above documentation for accuracy and completeness, and I agree with the above. Gardiner Sleeper, M.D., Ph.D. 04/03/19 4:15  PM   Abbreviations: M myopia (nearsighted); A astigmatism; H hyperopia (farsighted); P presbyopia; Mrx spectacle prescription;  CTL contact lenses; OD right eye; OS left eye; OU both eyes  XT exotropia; ET esotropia; PEK punctate epithelial keratitis; PEE punctate epithelial erosions; DES dry eye syndrome; MGD meibomian gland dysfunction; ATs artificial tears; PFAT's preservative free artificial tears; [place] nuclear sclerotic cataract; PSC posterior subcapsular cataract; ERM epi-retinal membrane; PVD posterior vitreous detachment; RD retinal detachment; DM diabetes mellitus; DR diabetic retinopathy; NPDR non-proliferative diabetic retinopathy; PDR proliferative diabetic retinopathy; CSME clinically significant macular edema; DME diabetic macular edema; dbh dot blot hemorrhages; CWS cotton wool spot; POAG primary open angle glaucoma; C/D cup-to-disc ratio; HVF humphrey visual field; GVF goldmann visual field; OCT optical coherence tomography; IOP intraocular pressure; BRVO Branch retinal vein occlusion; CRVO central retinal vein occlusion; CRAO central retinal artery occlusion; BRAO branch retinal artery occlusion; RT retinal tear; SB scleral buckle; PPV pars plana vitrectomy; VH Vitreous hemorrhage; PRP panretinal laser photocoagulation; IVK intravitreal kenalog; VMT vitreomacular traction; MH Macular hole;  NVD neovascularization of the disc; NVE neovascularization elsewhere; AREDS age related eye disease study; ARMD age related macular degeneration; POAG primary open angle glaucoma; EBMD epithelial/anterior basement membrane dystrophy; ACIOL anterior chamber intraocular lens; IOL intraocular lens; PCIOL posterior chamber intraocular lens; Phaco/IOL phacoemulsification with intraocular lens placement; [place] photorefractive keratectomy; LASIK laser assisted in situ keratomileusis; HTN hypertension; DM diabetes mellitus; COPD chronic obstructive pulmonary disease

## 2019-04-03 ENCOUNTER — Encounter: Payer: Self-pay | Admitting: *Deleted

## 2019-04-03 ENCOUNTER — Ambulatory Visit (INDEPENDENT_AMBULATORY_CARE_PROVIDER_SITE_OTHER): Payer: Medicare Other | Admitting: Ophthalmology

## 2019-04-03 DIAGNOSIS — M6281 Muscle weakness (generalized): Secondary | ICD-10-CM | POA: Diagnosis not present

## 2019-04-03 DIAGNOSIS — H3581 Retinal edema: Secondary | ICD-10-CM

## 2019-04-03 DIAGNOSIS — H353132 Nonexudative age-related macular degeneration, bilateral, intermediate dry stage: Secondary | ICD-10-CM

## 2019-04-03 DIAGNOSIS — R1312 Dysphagia, oropharyngeal phase: Secondary | ICD-10-CM | POA: Diagnosis not present

## 2019-04-03 DIAGNOSIS — R2681 Unsteadiness on feet: Secondary | ICD-10-CM | POA: Diagnosis not present

## 2019-04-03 DIAGNOSIS — D3131 Benign neoplasm of right choroid: Secondary | ICD-10-CM | POA: Diagnosis not present

## 2019-04-03 DIAGNOSIS — H35033 Hypertensive retinopathy, bilateral: Secondary | ICD-10-CM | POA: Diagnosis not present

## 2019-04-03 DIAGNOSIS — M545 Low back pain: Secondary | ICD-10-CM | POA: Diagnosis not present

## 2019-04-03 DIAGNOSIS — Z961 Presence of intraocular lens: Secondary | ICD-10-CM

## 2019-04-03 DIAGNOSIS — I482 Chronic atrial fibrillation, unspecified: Secondary | ICD-10-CM | POA: Diagnosis not present

## 2019-04-03 DIAGNOSIS — H34811 Central retinal vein occlusion, right eye, with macular edema: Secondary | ICD-10-CM

## 2019-04-03 DIAGNOSIS — I10 Essential (primary) hypertension: Secondary | ICD-10-CM

## 2019-04-03 MED ORDER — BEVACIZUMAB CHEMO INJECTION 1.25MG/0.05ML SYRINGE FOR KALEIDOSCOPE
1.2500 mg | INTRAVITREAL | Status: AC | PRN
  Administered 2019-04-03: 1.25 mg via INTRAVITREAL

## 2019-04-04 DIAGNOSIS — R2681 Unsteadiness on feet: Secondary | ICD-10-CM | POA: Diagnosis not present

## 2019-04-04 DIAGNOSIS — M6281 Muscle weakness (generalized): Secondary | ICD-10-CM | POA: Diagnosis not present

## 2019-04-04 DIAGNOSIS — I482 Chronic atrial fibrillation, unspecified: Secondary | ICD-10-CM | POA: Diagnosis not present

## 2019-04-04 DIAGNOSIS — R1312 Dysphagia, oropharyngeal phase: Secondary | ICD-10-CM | POA: Diagnosis not present

## 2019-04-04 DIAGNOSIS — M545 Low back pain: Secondary | ICD-10-CM | POA: Diagnosis not present

## 2019-04-05 DIAGNOSIS — R1312 Dysphagia, oropharyngeal phase: Secondary | ICD-10-CM | POA: Diagnosis not present

## 2019-04-05 DIAGNOSIS — I482 Chronic atrial fibrillation, unspecified: Secondary | ICD-10-CM | POA: Diagnosis not present

## 2019-04-05 DIAGNOSIS — M6281 Muscle weakness (generalized): Secondary | ICD-10-CM | POA: Diagnosis not present

## 2019-04-05 DIAGNOSIS — R2681 Unsteadiness on feet: Secondary | ICD-10-CM | POA: Diagnosis not present

## 2019-04-05 DIAGNOSIS — M545 Low back pain: Secondary | ICD-10-CM | POA: Diagnosis not present

## 2019-04-08 DIAGNOSIS — M6281 Muscle weakness (generalized): Secondary | ICD-10-CM | POA: Diagnosis not present

## 2019-04-08 DIAGNOSIS — R1312 Dysphagia, oropharyngeal phase: Secondary | ICD-10-CM | POA: Diagnosis not present

## 2019-04-08 DIAGNOSIS — R2681 Unsteadiness on feet: Secondary | ICD-10-CM | POA: Diagnosis not present

## 2019-04-08 DIAGNOSIS — I482 Chronic atrial fibrillation, unspecified: Secondary | ICD-10-CM | POA: Diagnosis not present

## 2019-04-08 DIAGNOSIS — M545 Low back pain: Secondary | ICD-10-CM | POA: Diagnosis not present

## 2019-04-09 DIAGNOSIS — I482 Chronic atrial fibrillation, unspecified: Secondary | ICD-10-CM | POA: Diagnosis not present

## 2019-04-09 DIAGNOSIS — M6281 Muscle weakness (generalized): Secondary | ICD-10-CM | POA: Diagnosis not present

## 2019-04-09 DIAGNOSIS — M545 Low back pain: Secondary | ICD-10-CM | POA: Diagnosis not present

## 2019-04-09 DIAGNOSIS — R2681 Unsteadiness on feet: Secondary | ICD-10-CM | POA: Diagnosis not present

## 2019-04-09 DIAGNOSIS — R1312 Dysphagia, oropharyngeal phase: Secondary | ICD-10-CM | POA: Diagnosis not present

## 2019-04-10 DIAGNOSIS — M545 Low back pain: Secondary | ICD-10-CM | POA: Diagnosis not present

## 2019-04-10 DIAGNOSIS — R2681 Unsteadiness on feet: Secondary | ICD-10-CM | POA: Diagnosis not present

## 2019-04-10 DIAGNOSIS — R1312 Dysphagia, oropharyngeal phase: Secondary | ICD-10-CM | POA: Diagnosis not present

## 2019-04-10 DIAGNOSIS — I482 Chronic atrial fibrillation, unspecified: Secondary | ICD-10-CM | POA: Diagnosis not present

## 2019-04-10 DIAGNOSIS — M6281 Muscle weakness (generalized): Secondary | ICD-10-CM | POA: Diagnosis not present

## 2019-04-11 ENCOUNTER — Ambulatory Visit: Payer: Medicare Other | Admitting: Physician Assistant

## 2019-04-16 ENCOUNTER — Other Ambulatory Visit: Payer: Self-pay

## 2019-04-16 NOTE — Telephone Encounter (Signed)
[facility] sent fax for refill on Medication "Pregabalin 25mg ". Please Advise.

## 2019-04-18 MED ORDER — PREGABALIN 25 MG PO CAPS: ORAL_CAPSULE | ORAL | 0 refills | Status: DC

## 2019-04-23 ENCOUNTER — Telehealth (INDEPENDENT_AMBULATORY_CARE_PROVIDER_SITE_OTHER): Payer: Self-pay

## 2019-04-24 ENCOUNTER — Ambulatory Visit: Payer: Medicare Other | Admitting: Physician Assistant

## 2019-04-25 ENCOUNTER — Other Ambulatory Visit: Payer: Self-pay | Admitting: *Deleted

## 2019-04-25 ENCOUNTER — Encounter (INDEPENDENT_AMBULATORY_CARE_PROVIDER_SITE_OTHER): Payer: Medicare Other | Admitting: Ophthalmology

## 2019-04-25 MED ORDER — PREGABALIN 50 MG PO CAPS: 50.0000 mg | ORAL_CAPSULE | Freq: Every day | ORAL | 0 refills | Status: DC

## 2019-04-25 NOTE — Telephone Encounter (Signed)
Received fax from [facility] Pended Rx and sent to Dr. Gupta for approval.  

## 2019-04-26 ENCOUNTER — Ambulatory Visit: Payer: Medicare Other | Admitting: Physician Assistant

## 2019-04-27 ENCOUNTER — Encounter (INDEPENDENT_AMBULATORY_CARE_PROVIDER_SITE_OTHER): Payer: Self-pay | Admitting: Ophthalmology

## 2019-04-27 ENCOUNTER — Ambulatory Visit (INDEPENDENT_AMBULATORY_CARE_PROVIDER_SITE_OTHER): Payer: Medicare Other | Admitting: Ophthalmology

## 2019-04-27 ENCOUNTER — Other Ambulatory Visit: Payer: Self-pay

## 2019-04-27 DIAGNOSIS — H3581 Retinal edema: Secondary | ICD-10-CM

## 2019-04-27 DIAGNOSIS — H35033 Hypertensive retinopathy, bilateral: Secondary | ICD-10-CM

## 2019-04-27 DIAGNOSIS — H34811 Central retinal vein occlusion, right eye, with macular edema: Secondary | ICD-10-CM

## 2019-04-27 DIAGNOSIS — H353132 Nonexudative age-related macular degeneration, bilateral, intermediate dry stage: Secondary | ICD-10-CM

## 2019-04-27 DIAGNOSIS — D3131 Benign neoplasm of right choroid: Secondary | ICD-10-CM | POA: Diagnosis not present

## 2019-04-27 DIAGNOSIS — Z961 Presence of intraocular lens: Secondary | ICD-10-CM | POA: Diagnosis not present

## 2019-04-27 DIAGNOSIS — I10 Essential (primary) hypertension: Secondary | ICD-10-CM

## 2019-04-27 NOTE — Progress Notes (Signed)
[facility] Note  04/27/2019     CHIEF COMPLAINT Patient presents for Retina Follow Up   HISTORY OF PRESENT ILLNESS: Vanessa [name] is a 86 y.o. female who presents to the clinic today for:   HPI    Retina Follow Up    Patient presents with  Other.  In right eye.  This started 3 weeks ago.  Severity is moderate.  I, the attending physician,  performed the HPI with the patient and updated documentation appropriately.          Comments    Patient here for retina follow up for seeing black spots after last injection. Patient states vision seeing black floaters. A bunch of them. Started shortly after last injection. No eye pain.        Last edited by Bernarda Caffey, MD on 04/27/2019  1:32 PM. (History)    Pt states she has been seeing black floaters in her right eye since her last injection, she states they are still present today and move around in her vision  Referring physician: [name], MD [place],  [place] [id number]  HISTORICAL INFORMATION:   Selected notes from the MEDICAL RECORD NUMBER Referred by Dr. [name] for concern of CME    CURRENT MEDICATIONS: Current Outpatient Medications (Ophthalmic Drugs)  Medication Sig  . Propylene Glycol (SYSTANE BALANCE) 0.6 % SOLN Give 1 drop to both eyes once daily as needed for dryness   No current facility-administered medications for this visit. (Ophthalmic Drugs)   Current Outpatient Medications (Other)  Medication Sig  . acetaminophen (TYLENOL) 325 MG tablet Take 650 mg by mouth every 8 (eight) hours as needed.  Marland Kitchen acetaminophen (TYLENOL) 500 MG tablet Take 500 mg by mouth 2 (two) times daily.   . Artificial Saliva (SALIVAMAX) PACK Use as directed 1 Package in the mouth or throat. mix with 66mL's of H20; swish and spit BID; may keep in bathroom; may self administer.  Marland Kitchen aspirin 81 MG chewable tablet Chew 81 mg by mouth daily.  Marland Kitchen atenolol (TENORMIN) 25 MG tablet Take 12.5  mg by mouth 2 (two) times daily. Hold if SBP < 100 or HR < 60  . Biotin 5000 MCG TABS Take 5,000 mcg by mouth daily.  . famotidine (PEPCID) 20 MG tablet Take 20 mg by mouth daily.  . fluticasone (FLONASE) 50 MCG/ACT nasal spray Place 2 sprays into both nostrils daily.  . hydrogen peroxide 1.5 % SOLN Use 15 ml orally, swish and spit as needed for dry mouth.  Marland Kitchen ipratropium (ATROVENT) 0.06 % nasal spray Place 2 sprays into both nostrils 2 (two) times daily as needed for rhinitis.  Prudencio Burly ([place]) [place] by mouth. Take 1 in the morning.  . lactose free nutrition (BOOST PLUS) LIQD Take 237 mLs by mouth 2 (two) times daily with a meal. Per request of resident, states can't eat if takes it later   . lidocaine-prilocaine (EMLA) cream Apply 1 application topically daily as needed.  . loratadine (CLARITIN) 10 MG tablet Take 10 mg by mouth daily as needed for allergies.  . Menthol, Topical Analgesic, (BIOFREEZE ROLL-ON) 4 % GEL Apply 1 application topically every 8 (eight) hours. Apply to LLE  . ondansetron (ZOFRAN) 4 MG tablet Take 4 mg by mouth every 6 (six) hours as needed for nausea or vomiting.  Marland Kitchen Peppermint Oil (IBGARD) 90 MG CPCR Take 2 capsules by mouth 2 (two) times daily.   Marland Kitchen  polyethylene glycol (MIRALAX / GLYCOLAX) packet Take 17 g daily as needed by mouth (for constipation).   . pregabalin (LYRICA) 25 MG capsule Take one capsule by mouth once daily every morning  . pregabalin (LYRICA) 50 MG capsule Take 1 capsule (50 mg total) by mouth at bedtime.  . sennosides-docusate sodium (SENOKOT-S) 8.6-50 MG tablet Take 2 tablets by mouth daily.   . sodium phosphate (FLEET) 7-19 GM/118ML ENEM Place 1 enema rectally as needed for severe constipation. Give Fleet enema per rectum X 1 after removing hard stool or if soft stool is present in rectum.  . traMADol (ULTRAM) 50 MG tablet Take 50 mg by mouth daily. As needed  . traMADol (ULTRAM) 50 MG tablet Take 1 tablet (50 mg  total) by mouth 2 (two) times daily.  Marland Kitchen triamcinolone cream (KENALOG) 0.1 % Apply 1 application topically as needed. Apply to itchy areas as needed. May keep in room and self apply   No current facility-administered medications for this visit. (Other)      REVIEW OF SYSTEMS: ROS    Positive for: Gastrointestinal, Neurological, Musculoskeletal, Cardiovascular, Eyes   Negative for: Constitutional, Skin, Genitourinary, HENT, Endocrine, Respiratory, Psychiatric, Allergic/Imm, Heme/Lymph   Last edited by Theodore Demark, COA on 04/27/2019  1:27 PM. (History)       ALLERGIES No Known Allergies  PAST MEDICAL HISTORY Past Medical History:  Diagnosis Date  . Abdominal bloating   . Atrial fibrillation ([place])   . Bruises easily   . Carotid artery occlusion    LEFT  . Dizziness   . DOE (dyspnea on exertion) 04/02/2014  . Fall at home Sept 2013, Dec. 2013  Jun 08, 2012  . GERD (gastroesophageal reflux disease) 02/11/2015  . Headache(784.0)   . Hoarseness   . Hypercholesterolemia   . Hyperglycemia 04/02/2014   Glucose 178 mg percent on 01/22/2014 04/05/14 Hgb A1c 6.6 Diet controlled.    . Hypertensive retinopathy    OU  . Hypothyroidism 04/15/2015  . Neuropathy    PERIPHERAL  . Pruritus   . Scoliosis   . Varicose veins    Past Surgical History:  Procedure Laterality Date  . ABDOMINAL HYSTERECTOMY  1954  . CATARACT EXTRACTION Bilateral   . CHOLECYSTECTOMY  1997  . ESOPHAGOGASTRODUODENOSCOPY N/A 03/30/2019   Procedure: ESOPHAGOGASTRODUODENOSCOPY (EGD);  Surgeon: Jerene Bears, MD;  Location: [facility];  Service: Gastroenterology;  Laterality: N/A;  . ESOPHAGOGASTRODUODENOSCOPY (EGD) WITH PROPOFOL N/A 11/25/2016   Procedure: ESOPHAGOGASTRODUODENOSCOPY (EGD) WITH PROPOFOL;  Surgeon: [name], MD;  Location: [facility];  Service: Endoscopy;  Laterality: N/A;  . EYE SURGERY Bilateral    Cat Sx  . SPINE SURGERY  1997   correct scoliosis    FAMILY HISTORY Family History   Adopted: Yes  Problem Relation Age of Onset  . Diabetes Mother   . Heart attack Mother   . Heart disease Mother        After age 5  . Heart attack Father   . Stroke Father   . Breast cancer Sister   . Heart disease Maternal Grandmother   . Cancer Son        adopted son. esophagus, stomach, liver  . Stomach cancer Neg Hx   . Colon cancer Neg Hx     SOCIAL HISTORY Social History   Tobacco Use  . Smoking status: Never Smoker  . Smokeless tobacco: Never Used  Substance Use Topics  . Alcohol use: No    Alcohol/week: 0.0 standard drinks  .  Drug use: No         OPHTHALMIC EXAM:  Base Eye Exam    Visual Acuity (Snellen - Linear)      Right Left   Dist cc CF at 3' 20/25 +2   Dist ph cc NI NI   Correction: Glasses       Tonometry (Tonopen, 1:23 PM)      Right Left   Pressure 16 14       Pupils      Dark Light Shape React APD   Right 2 1 Round Minimal None   Left 2 1 Round Minimal None       Visual Fields (Counting fingers)      Left Right    Full Full       Extraocular Movement      Right Left    Full, Ortho Full, Ortho       Neuro/Psych    Oriented x3: Yes   Mood/Affect: Normal       Dilation    Both eyes: 1.0% Mydriacyl, 2.5% Phenylephrine @ 1:23 PM        Slit Lamp and Fundus Exam    Slit Lamp Exam      Right Left   Lids/Lashes Dermatochalasis - upper lid Dermatochalasis - upper lid, Meibomian gland dysfunction   Conjunctiva/Sclera White and quiet White and quiet   Cornea Arcus, 1+ diffuse Punctate epithelial erosions Arcus, 2+ diffuse Punctate epithelial erosions   Anterior Chamber deep and clear deep and clear   Iris Round and moderately dilated to 96mm Round and moderately dilated to 41mm   Lens PC IOL in good position with open PC PC IOL in good position with open PC   Vitreous Vitreous syneresis Vitreous syneresis       Fundus Exam      Right Left   Disc Mild pallor, sharp rim, +focal hyperemia and vascular loops superiorly Pink and  Sharp   C/D Ratio 0.4 0.4   Macula stable improvement central CME, Blunted foveal reflex, RPE mottling and clumping, persistent IRH/DBH Flat, Blunted foveal reflex, Drusen, RPE mottling and clumping, No heme or edema   Vessels Attenuated, tortuous Vascular attenuation, Tortuous   Periphery Attached, choroidal nevus at 0930 equator w/ overlying drusen, minimal elavation, no orange pigment, 3DD in diam, 360 DBH Attached, reticular degeneration, peripheral cystoid degeneration        Refraction    Wearing Rx      Sphere Cylinder Axis Add   Right -2.75 +2.75 010 +3.25   Left -3.75 +3.25 011 +3.25          IMAGING AND PROCEDURES  Imaging and Procedures for @TODAY @  OCT, Retina - OU - Both Eyes       Right Eye Quality was good. Central Foveal Thickness: 179. Progression has improved. Findings include retinal drusen , inner retinal atrophy, intraretinal hyper-reflective material, abnormal foveal contour, subretinal fluid, outer retinal atrophy, no IRF (interval resolution of IRF/cystic changes ).   Left Eye Quality was good. Central Foveal Thickness: 227. Progression has been stable. Findings include normal foveal contour, no SRF, no IRF, retinal drusen , outer retinal atrophy (Mild patchy ORA; no significant change from prior).   Notes *Images captured and stored on drive  Diagnosis / Impression:  non-exu ARMD OU OD: CRVO; interval resolution of IRF/cystic changes  OS: NFP; no IRF/SRF; No significant change from prior.    Clinical management:  See below  Abbreviations: NFP - Normal foveal profile. CME -  cystoid macular edema. PED - pigment epithelial detachment. IRF - intraretinal fluid. SRF - subretinal fluid. EZ - ellipsoid zone. ERM - epiretinal membrane. ORA - outer retinal atrophy. ORT - outer retinal tubulation. SRHM - subretinal hyper-reflective material                 ASSESSMENT/PLAN:    ICD-10-CM   1. Central retinal vein occlusion with macular edema of  right eye  H34.8110   2. Retinal edema  H35.81 OCT, Retina - OU - Both Eyes  3. Intermediate stage nonexudative age-related macular degeneration of both eyes  H35.3132   4. Essential hypertension  I10   5. Hypertensive retinopathy of both eyes  H35.033   6. Nevus of choroid of right eye  D31.31   7. Pseudophakia of both eyes  Z96.1    Pt presents acutely today due to persistent floaters in visual field OD following most recent injection. Pt reports similar episodes following previous injections, but these are persisting much longer. - no objective findings on exam to explain symptoms - eye exam remains stable from prior in OD which has low vision - ?unilateral Sherran Needs syndrome - no acute intervention indicated - f/u as scheduled (Apr 27)   1,2. CRVO with CME OD  - pt presents acutely today for black floaters in vision following injection of Avastin at last appt  - s/p IVA OD #1 (01.22.20), #2 (02.18.20), #3 (04.22.20),#4 (05.20.20), #5 (06.24.20), #6 (08.04.20), #7 (09.08.20), #8 (2.12.21)  - FA 2.19.2020 shows delayed filling time OD, no active leakage -- ?RAO component  - pt had massive recurrence of CME OD on 2.12.21 -- 5 mos since previous IVA OD; persistent ORA  - today, OCT shows interval resolution of IRF/CME  - BCVA OD stable at [facility]  - informed consent for Avastin signed and scanned on 09.08.2020 (OD)  - F/U April 27, as scheduled -- DFE/OCT/possible injection; tx and ext as able  3. Age related macular degeneration, non-exudative, intermediate stage OU  - The incidence, anatomy, and pathology of dry AMD, risk of progression, and the AREDS and AREDS 2 study including smoking risks discussed with patient.  - recommend amsler grid monitoring  - monitor  4,5. Hypertensive retinopathy OU  - discussed importance of tight BP control  - monitor  6. Choroidal Nevus OD  - ~3DD in diameter, round, located at 0930 equator  - +drusen; no orange pigment or SRF  - minimal  elevation  - baseline Optos imaging obtained 2.19.2020  - optos imaging repeated 2.14.2021  - stable today  - monitor  7. Pseudophakia OU  - s/p CE/IOL OU  - beautiful surgeries, doing well  - monitor   Ophthalmic Meds Ordered this visit:  No orders of the defined types were placed in this encounter.      Return for f/u April 27 as scheduled.  There are no Patient Instructions on file for this visit.   Explained the diagnoses, plan, and follow up with the patient and they expressed understanding.  Patient expressed understanding of the importance of proper follow up care.   This document serves as a record of services personally performed by Gardiner Sleeper, MD, PhD. It was created on their behalf by Ernest Mallick, OA, an ophthalmic assistant. The creation of this record is the provider's dictation and/or activities during the visit.    Electronically signed by: Ernest Mallick, OA 04.16.2021 8:39 PM  Gardiner Sleeper, M.D., Ph.D. Diseases & Surgery of the Retina and  Vitreous [facility]  I have reviewed the above documentation for accuracy and completeness, and I agree with the above. Gardiner Sleeper, M.D., Ph.D. 04/27/19 8:39 PM   Abbreviations: M myopia (nearsighted); A astigmatism; H hyperopia (farsighted); P presbyopia; Mrx spectacle prescription;  CTL contact lenses; OD right eye; OS left eye; OU both eyes  XT exotropia; ET esotropia; PEK punctate epithelial keratitis; PEE punctate epithelial erosions; DES dry eye syndrome; MGD meibomian gland dysfunction; ATs artificial tears; PFAT's preservative free artificial tears; [place] nuclear sclerotic cataract; PSC posterior subcapsular cataract; ERM epi-retinal membrane; PVD posterior vitreous detachment; RD retinal detachment; DM diabetes mellitus; DR diabetic retinopathy; NPDR non-proliferative diabetic retinopathy; PDR proliferative diabetic retinopathy; CSME clinically significant macular edema; DME diabetic  macular edema; dbh dot blot hemorrhages; CWS cotton wool spot; POAG primary open angle glaucoma; C/D cup-to-disc ratio; HVF humphrey visual field; GVF goldmann visual field; OCT optical coherence tomography; IOP intraocular pressure; BRVO Branch retinal vein occlusion; CRVO central retinal vein occlusion; CRAO central retinal artery occlusion; BRAO branch retinal artery occlusion; RT retinal tear; SB scleral buckle; PPV pars plana vitrectomy; VH Vitreous hemorrhage; PRP panretinal laser photocoagulation; IVK intravitreal kenalog; VMT vitreomacular traction; MH Macular hole;  NVD neovascularization of the disc; NVE neovascularization elsewhere; AREDS age related eye disease study; ARMD age related macular degeneration; POAG primary open angle glaucoma; EBMD epithelial/anterior basement membrane dystrophy; ACIOL anterior chamber intraocular lens; IOL intraocular lens; PCIOL posterior chamber intraocular lens; Phaco/IOL phacoemulsification with intraocular lens placement; [place] photorefractive keratectomy; LASIK laser assisted in situ keratomileusis; HTN hypertension; DM diabetes mellitus; COPD chronic obstructive pulmonary disease

## 2019-05-07 ENCOUNTER — Encounter: Payer: Self-pay | Admitting: Nurse Practitioner

## 2019-05-07 ENCOUNTER — Non-Acute Institutional Stay: Payer: Medicare Other | Admitting: Nurse Practitioner

## 2019-05-07 DIAGNOSIS — G629 Polyneuropathy, unspecified: Secondary | ICD-10-CM | POA: Diagnosis not present

## 2019-05-07 DIAGNOSIS — G8929 Other chronic pain: Secondary | ICD-10-CM | POA: Diagnosis not present

## 2019-05-07 DIAGNOSIS — M545 Low back pain: Secondary | ICD-10-CM

## 2019-05-07 DIAGNOSIS — R2681 Unsteadiness on feet: Secondary | ICD-10-CM

## 2019-05-07 DIAGNOSIS — W19XXXA Unspecified fall, initial encounter: Secondary | ICD-10-CM

## 2019-05-07 DIAGNOSIS — G3184 Mild cognitive impairment, so stated: Secondary | ICD-10-CM

## 2019-05-07 DIAGNOSIS — H3530 Unspecified macular degeneration: Secondary | ICD-10-CM | POA: Diagnosis not present

## 2019-05-07 NOTE — Progress Notes (Signed)
Location:    [place] Room Number: 1 Place of Service:  ALF [phone]) Provider:  Marda Stalker, Lennie Odor NP   [name], MD  Patient Care Team: [name], MD as PCP - General (Internal Medicine) Angelia Mould, MD as Attending Physician (Vascular Surgery) [name], MD (Inactive) as Attending Physician (Obstetrics and Gynecology) [name], MD (Orthopedic Surgery) [name], MD (Ophthalmology) [name], MD as Consulting Physician (Urology) [name], MD as Consulting Physician (Cardiology) [name], PA-C as Physician Assistant (Otolaryngology) [name], MD as Consulting Physician (Otolaryngology) [name], MD as Consulting Physician (Dermatology) [name], MD as Consulting Physician (Physical Medicine and Rehabilitation) [name], MD as Attending Physician (Gastroenterology) Zenia Guest X, NP as Nurse Practitioner (Internal Medicine) Kathrynn Ducking, MD as Consulting Physician (Neurology) Debara Pickett Nadean Corwin, MD as Consulting Physician (Cardiology) Druscilla Brownie, MD as Consulting Physician (Dermatology) [name], [name], NP as Nurse Practitioner (Family Medicine)  Extended Emergency Contact Information Primary Emergency Contact: [name] of [place] Phone: [phone] Mobile Phone: [phone] Relation: Son Secondary Emergency Contact: [name] Mobile Phone: [phone] Relation: Friend  Code Status:  Full Code Goals of care: Advanced Directive information Advanced Directives 05/07/2019  Does Patient Have a Medical Advance Directive? Yes  Type of Advance Directive [place]  Does patient want to make changes to medical advance directive? No - Patient declined  Copy of [place] in Chart? -  Would patient like information on creating a medical advance directive? -     Chief Complaint  Patient presents with  . Acute Visit     Fall    HPI:  Pt is a 86 y.o. female seen today for an acute visit for fall, the patient stated she lost her balance when getting off bed, no apparent injury. Hx of peripheral neuropathy, ambulates with walker, takes Lyrica 25mg  qd, 50mg  qd. The patient sees spots in her eye the day before yesterday after eye inj, resolved today, decided to delay f/u Ophthalmology, no focal weakness or headache associated with seeing spots. Hx of OA pain, multiple sites, on Tylenol 500mg  bid/04n 650mg  q8hrs, Tramadol 50mg  bid and prn daily.    Past Medical History:  Diagnosis Date  . Abdominal bloating   . Atrial fibrillation ([place])   . Bruises easily   . Carotid artery occlusion    LEFT  . Cricopharyngeal dysphagia   . Dizziness   . DOE (dyspnea on exertion) 04/02/2014  . Fall at home Sept 2013, Dec. 2013  Jun 08, 2012  . GERD (gastroesophageal reflux disease) 02/11/2015  . Headache(784.0)   . Hoarseness   . Hypercholesterolemia   . Hyperglycemia 04/02/2014   Glucose 178 mg percent on 01/22/2014 04/05/14 Hgb A1c 6.6 Diet controlled.    . Hypertensive retinopathy    OU  . Hypothyroidism 04/15/2015  . Neuropathy    PERIPHERAL  . Pruritus   . Scoliosis   . Varicose veins   . Zenker's diverticulum    Past Surgical History:  Procedure Laterality Date  . ABDOMINAL HYSTERECTOMY  1954  . CATARACT EXTRACTION Bilateral   . CHOLECYSTECTOMY  1997  . ESOPHAGOGASTRODUODENOSCOPY N/A 03/30/2019   Procedure: ESOPHAGOGASTRODUODENOSCOPY (EGD);  Surgeon: Jerene Bears, MD;  Location: [facility];  Service: Gastroenterology;  Laterality: N/A;  . ESOPHAGOGASTRODUODENOSCOPY (EGD) WITH PROPOFOL N/A 11/25/2016   Procedure: ESOPHAGOGASTRODUODENOSCOPY (EGD) WITH PROPOFOL;  Surgeon: [name], MD;  Location: [facility];  Service: Endoscopy;  Laterality: N/A;  . EYE SURGERY Bilateral    Cat Sx  . SPINE SURGERY  1997   correct scoliosis    No Known Allergies  Allergies as of 05/07/2019   No Known Allergies      Medication List       Accurate as of May 07, 2019 11:59 PM. If you have any questions, ask your nurse or doctor.        acetaminophen 500 MG tablet Commonly known as: TYLENOL Take 500 mg by mouth 2 (two) times daily.   acetaminophen 325 MG tablet Commonly known as: TYLENOL Take 650 mg by mouth every 8 (eight) hours as needed.   aspirin 81 MG chewable tablet Chew 81 mg by mouth daily.   atenolol 25 MG tablet Commonly known as: TENORMIN Take 12.5 mg by mouth 2 (two) times daily. Hold if SBP < 100 or HR < 60   Biofreeze Roll-On 4 % Gel Generic drug: Menthol (Topical Analgesic) Apply 1 application topically every 8 (eight) hours. Apply to LLE   Biotin 5000 MCG Tabs Take 5,000 mcg by mouth daily.   famotidine 20 MG tablet Commonly known as: PEPCID Take 20 mg by mouth daily.   fluticasone 50 MCG/ACT nasal spray Commonly known as: FLONASE Place 2 sprays into both nostrils daily.   hydrogen peroxide 1.5 % Soln Use 15 ml orally, swish and spit as needed for dry mouth.   IBgard 90 MG Cpcr Generic drug: Peppermint Oil Take 2 capsules by mouth 2 (two) times daily.   ipratropium 0.06 % nasal spray Commonly known as: ATROVENT Place 2 sprays into both nostrils 2 (two) times daily as needed for rhinitis.   lactose free nutrition Liqd Take 237 mLs by mouth 2 (two) times daily with a meal. Per request of resident, states can't eat if takes it later   lidocaine-prilocaine cream Commonly known as: EMLA Apply 1 application topically daily as needed.   loratadine 10 MG tablet Commonly known as: CLARITIN Take 10 mg by mouth daily as needed for allergies.   ondansetron 4 MG tablet Commonly known as: ZOFRAN Take 4 mg by mouth every 6 (six) hours as needed for nausea or vomiting.   polyethylene glycol 17 g packet Commonly known as: MIRALAX / GLYCOLAX Take 17 g daily as needed by mouth (for constipation).   pregabalin 25 MG capsule Commonly known as: Lyrica Take one  capsule by mouth once daily every morning   pregabalin 50 MG capsule Commonly known as: LYRICA Take 1 capsule (50 mg total) by mouth at bedtime.   SalivaMAX Pack Use as directed 1 Package in the mouth or throat. mix with 19mL's of H20; swish and spit BID; may keep in bathroom; may self administer.   sennosides-docusate sodium 8.6-50 MG tablet Commonly known as: SENOKOT-S Take 2 tablets by mouth daily.   sodium phosphate 7-19 GM/118ML Enem Place 1 enema rectally as needed for severe constipation. Give Fleet enema per rectum X 1 after removing hard stool or if soft stool is present in rectum.   Systane Balance 0.6 % Soln Generic drug: Propylene Glycol Give 1 drop to both eyes once daily as needed for dryness   traMADol 50 MG tablet Commonly known as: ULTRAM Take 50 mg by mouth daily. As needed   traMADol 50 MG tablet Commonly known as: ULTRAM Take 1 tablet (50 mg total) by mouth 2 (two) times daily.   triamcinolone cream 0.1 % Commonly known as: KENALOG Apply 1 application topically as needed.  Apply to itchy areas as needed. May keep in room and self apply   [facility] by mouth. Take 1 in the morning.       Review of Systems  Constitutional: Negative for activity change, appetite change, fatigue and fever.  HENT: Positive for hearing loss and sore throat. Negative for congestion, facial swelling, rhinorrhea, trouble swallowing and voice change.   Eyes: Positive for visual disturbance. Negative for photophobia, pain, discharge, redness and itching.  Respiratory: Negative for cough and shortness of breath.   Cardiovascular: Negative for leg swelling.  Gastrointestinal: Negative for abdominal distention, abdominal pain, constipation, diarrhea, nausea and vomiting.       Gagging.   Genitourinary: Negative for difficulty urinating.  Musculoskeletal: Positive for arthralgias, back pain and gait problem.  Skin: Negative for color change and pallor.   Neurological: Negative for dizziness, tremors, seizures, syncope, facial asymmetry, speech difficulty, weakness, light-headedness and headaches.  Psychiatric/Behavioral: Negative for agitation and sleep disturbance. The patient is not nervous/anxious.     Immunization History  Administered Date(s) Administered  . Influenza Split 10/20/2011, 11/03/2012  . Influenza, High Dose Seasonal PF 10/24/2018  . Influenza,inj,Quad PF,6+ Mos 09/29/2012, 11/09/2013  . Influenza-Unspecified 10/17/2014, 10/23/2015, 10/20/2016, 10/17/2017  . Moderna SARS-COVID-2 Vaccination 01/15/2019, 02/12/2019  . PPD Test 08/21/2013, 03/04/2014  . Pneumococcal Conjugate-13 08/21/2013  . Pneumococcal Polysaccharide-23 09/08/2016  . Tdap 05/20/2008  . Tetanus 11/02/2018  . Zoster 11/01/2012   Pertinent  Health Maintenance Due  Topic Date Due  . INFLUENZA VACCINE  08/12/2019  . DEXA SCAN  Completed  . PNA vac Low Risk Adult  Completed   Fall Risk  11/04/2017 09/05/2017 09/22/2016 08/25/2016 05/31/2016  Falls in the past year? Yes Yes Yes Yes No  Number falls in past yr: 2 or more 2 or more 1 2 or more -  Comment - - - - -  Injury with Fall? Yes Yes Yes Yes -  Comment - - fx 3 ribs - -  Risk Factor Category  - - - - -  Risk for fall due to : - - Impaired mobility;Impaired balance/gait - -  Risk for fall due to: Comment - - - - -   Functional Status Survey:    Vitals:   05/07/19 1140  BP: (!) 142/70  Pulse: 60  Resp: 18  Temp: (!) 97.5 F (36.4 C)  SpO2: 94%  Weight: 101 lb 6.4 oz (46 kg)  Height: 5\' 2"  (1.575 m)   Body mass index is 18.55 kg/m. Physical Exam Vitals and nursing note reviewed.  Constitutional:      Appearance: Normal appearance.  HENT:     Head: Normocephalic and atraumatic.     Nose: Nose normal. No congestion or rhinorrhea.     Mouth/Throat:     Mouth: Mucous membranes are moist.     Comments: Unable to see the piece of lettuce stuck in her throat.  Eyes:     General: Vision  grossly intact. Gaze aligned appropriately. No allergic shiner, visual field deficit or scleral icterus.       Right eye: No foreign body, discharge or hordeolum.        Left eye: No foreign body, discharge or hordeolum.     Extraocular Movements: Extraocular movements intact.     Conjunctiva/sclera: Conjunctivae normal.     Right eye: Right conjunctiva is not injected. No exudate or hemorrhage.    Left eye: Left conjunctiva is not injected. No exudate or hemorrhage.  Pupils: Pupils are equal, round, and reactive to light.  Cardiovascular:     Rate and Rhythm: Normal rate. Rhythm irregular.     Heart sounds: No murmur.  Pulmonary:     Effort: Pulmonary effort is normal.     Breath sounds: No rales.  Abdominal:     General: Bowel sounds are normal. There is no distension.     Palpations: Abdomen is soft.     Tenderness: There is no abdominal tenderness.  Musculoskeletal:     Cervical back: Normal range of motion and neck supple.     Right lower leg: No edema.     Left lower leg: No edema.  Skin:    General: Skin is warm and dry.  Neurological:     General: No focal deficit present.     Mental Status: She is alert. Mental status is at baseline.     Cranial Nerves: No cranial nerve deficit.     Motor: No weakness.     Coordination: Coordination normal.     Gait: Gait abnormal.     Comments: Oriented to person, place.   Psychiatric:        Mood and Affect: Mood normal.        Behavior: Behavior normal.        Thought Content: Thought content normal.        Judgment: Judgment normal.     Labs reviewed: Recent Labs    12/28/18 0000  NA 139  K 4.6  CL 105  CO2 29*  BUN 17  CREATININE 0.8  CALCIUM 9.0   Recent Labs    12/28/18 0000  AST 23  ALT 13  ALKPHOS 68  ALBUMIN 3.5   Recent Labs    12/28/18 0000  WBC 6.2  NEUTROABS 3,398  HGB 12.5  HCT 36  PLT 202   Lab Results  Component Value Date   TSH 3.16 02/08/2019   Lab Results  Component Value Date    HGBA1C 5.8 12/28/2018   Lab Results  Component Value Date   CHOL 187 11/03/2017   HDL 84 (A) 11/03/2017   LDLCALC 80 11/03/2017   TRIG 136 11/03/2017   CHOLHDL 2.1 07/29/2016    Significant Diagnostic Results in last 30 days:  OCT, Retina - OU - Both Eyes  Result Date: 04/27/2019 Right Eye Quality was good. Central Foveal Thickness: 179. Progression has improved. Findings include retinal drusen , inner retinal atrophy, intraretinal hyper-reflective material, abnormal foveal contour, subretinal fluid, outer retinal atrophy, no IRF (interval resolution of IRF/cystic changes ). Left Eye Quality was good. Central Foveal Thickness: 227. Progression has been stable. Findings include normal foveal contour, no SRF, no IRF, retinal drusen , outer retinal atrophy (Mild patchy ORA; no significant change from prior). Notes *Images captured and stored on drive Diagnosis / Impression: non-exu ARMD OU OD: CRVO; interval resolution of IRF/cystic changes OS: NFP; no IRF/SRF; No significant change from prior. Clinical management: See below Abbreviations: NFP - Normal foveal profile. CME - cystoid macular edema. PED - pigment epithelial detachment. IRF - intraretinal fluid. SRF - subretinal fluid. EZ - ellipsoid zone. ERM - epiretinal membrane. ORA - outer retinal atrophy. ORT - outer retinal tubulation. SRHM - subretinal hyper-reflective material    Assessment/Plan Fall The patient stated she lost her balance when getting off bed, no apparent injury, unsteady gait and peripheral neuropathy are contributory, the patient was emphasized fall precaution. Close supervision needed for safety.   Unsteady gait Continue ambulates with walker,  fall risk, close supervision needed.   Mild cognitive impairment Needs reminder and supervision for safety, care assistance.   Chronic lower back pain Continue Tylenol, Tramadol, Lyrica.   Neuropathy Continue Lyrica, ambulates with walker, safety precaution.   Macular  degeneration, age related Last seen ophthalmology 04/27/19, f/u as needed.      Family/ staff Communication: plan of care reviewed with the patient and charge nurse.   Labs/tests ordered:  none  Time spend 40 minutes.

## 2019-05-08 ENCOUNTER — Encounter (INDEPENDENT_AMBULATORY_CARE_PROVIDER_SITE_OTHER): Payer: Medicare Other | Admitting: Ophthalmology

## 2019-05-09 ENCOUNTER — Encounter: Payer: Self-pay | Admitting: Nurse Practitioner

## 2019-05-09 DIAGNOSIS — H3530 Unspecified macular degeneration: Secondary | ICD-10-CM | POA: Insufficient documentation

## 2019-05-09 NOTE — Assessment & Plan Note (Signed)
Last seen ophthalmology 04/27/19, f/u as needed.

## 2019-05-09 NOTE — Assessment & Plan Note (Signed)
Continue ambulates with walker, fall risk, close supervision needed.

## 2019-05-09 NOTE — Assessment & Plan Note (Signed)
Continue Tylenol, Tramadol, Lyrica.

## 2019-05-09 NOTE — Assessment & Plan Note (Signed)
The patient stated she lost her balance when getting off bed, no apparent injury, unsteady gait and peripheral neuropathy are contributory, the patient was emphasized fall precaution. Close supervision needed for safety.

## 2019-05-09 NOTE — Assessment & Plan Note (Signed)
Continue Lyrica, ambulates with walker, safety precaution.

## 2019-05-09 NOTE — Assessment & Plan Note (Signed)
Needs reminder and supervision for safety, care assistance.

## 2019-05-10 ENCOUNTER — Ambulatory Visit: Payer: Medicare Other | Admitting: Physician Assistant

## 2019-05-16 ENCOUNTER — Non-Acute Institutional Stay: Payer: Medicare Other | Admitting: Internal Medicine

## 2019-05-16 ENCOUNTER — Other Ambulatory Visit: Payer: Self-pay | Admitting: *Deleted

## 2019-05-16 ENCOUNTER — Encounter: Payer: Self-pay | Admitting: Internal Medicine

## 2019-05-16 DIAGNOSIS — I4891 Unspecified atrial fibrillation: Secondary | ICD-10-CM | POA: Diagnosis not present

## 2019-05-16 DIAGNOSIS — R1310 Dysphagia, unspecified: Secondary | ICD-10-CM | POA: Diagnosis not present

## 2019-05-16 DIAGNOSIS — R1319 Other dysphagia: Secondary | ICD-10-CM

## 2019-05-16 DIAGNOSIS — R531 Weakness: Secondary | ICD-10-CM | POA: Diagnosis not present

## 2019-05-16 MED ORDER — TRAMADOL HCL 50 MG PO TABS: 50.0000 mg | ORAL_TABLET | Freq: Two times a day (BID) | ORAL | 0 refills | Status: DC

## 2019-05-16 NOTE — Telephone Encounter (Signed)
Received refill Request Pended Rx and sent to Dr. Lyndel Safe for approval.

## 2019-05-16 NOTE — Progress Notes (Signed)
Location: [place] Room Number: 1 Place of Service:  ALF (13)  Provider:   Code Status:  Goals of Care:  Advanced Directives 05/07/2019  Does Patient Have a Medical Advance Directive? Yes  Type of Advance Directive [place]  Does patient want to make changes to medical advance directive? No - Patient declined  Copy of [place] in Chart? -  Would patient like information on creating a medical advance directive? -     Chief Complaint  Patient presents with  . Acute Visit    Weakness    HPI: Patient is a 86 y.o. female seen today for an acute visit for Weakness and Confusion  Patient has h/ochronic atrial fibrillation not on any coagulation, GERD, hypothyroidism, hyperlipidemia, neuropathy,, cognitive impairment, H/o Syncope, h/o IBS and Esophageal Stenosis s/p Dilatation. She also Has H/o Retinal Vein Occlusion with Edema S/P Vitreal Injections  Staff noticed patient slightly more confused. C/O Feeling weak. No Fever or Cough No SOB No Dysuria  Patient denies any Dysphagia. Just says she does not  feel good. Past Medical History:  Diagnosis Date  . Abdominal bloating   . Atrial fibrillation ([place])   . Bruises easily   . Carotid artery occlusion    LEFT  . Cricopharyngeal dysphagia   . Dizziness   . DOE (dyspnea on exertion) 04/02/2014  . Fall at home Sept 2013, Dec. 2013  Jun 08, 2012  . GERD (gastroesophageal reflux disease) 02/11/2015  . Headache(784.0)   . Hoarseness   . Hypercholesterolemia   . Hyperglycemia 04/02/2014   Glucose 178 mg percent on 01/22/2014 04/05/14 Hgb A1c 6.6 Diet controlled.    . Hypertensive retinopathy    OU  . Hypothyroidism 04/15/2015  . Neuropathy    PERIPHERAL  . Pruritus   . Scoliosis   . Varicose veins   . Zenker's diverticulum     Past Surgical History:  Procedure Laterality Date  . ABDOMINAL HYSTERECTOMY  1954  . CATARACT EXTRACTION Bilateral   . CHOLECYSTECTOMY   1997  . ESOPHAGOGASTRODUODENOSCOPY N/A 03/30/2019   Procedure: ESOPHAGOGASTRODUODENOSCOPY (EGD);  Surgeon: Jerene Bears, MD;  Location: [facility];  Service: Gastroenterology;  Laterality: N/A;  . ESOPHAGOGASTRODUODENOSCOPY (EGD) WITH PROPOFOL N/A 11/25/2016   Procedure: ESOPHAGOGASTRODUODENOSCOPY (EGD) WITH PROPOFOL;  Surgeon: [name], MD;  Location: [facility];  Service: Endoscopy;  Laterality: N/A;  . EYE SURGERY Bilateral    Cat Sx  . SPINE SURGERY  1997   correct scoliosis    No Known Allergies  Outpatient Encounter Medications as of 05/16/2019  Medication Sig  . acetaminophen (TYLENOL) 325 MG tablet Take 650 mg by mouth every 8 (eight) hours as needed.  Marland Kitchen acetaminophen (TYLENOL) 500 MG tablet Take 500 mg by mouth 2 (two) times daily.   . Artificial Saliva (SALIVAMAX) PACK Use as directed 1 Package in the mouth or throat. mix with 40mL's of H20; swish and spit BID; may keep in bathroom; may self administer.  Marland Kitchen aspirin 81 MG chewable tablet Chew 81 mg by mouth daily.  Marland Kitchen atenolol (TENORMIN) 25 MG tablet Take 12.5 mg by mouth 2 (two) times daily. Hold if SBP < 100 or HR < 60  . Biotin 5000 MCG TABS Take 5,000 mcg by mouth daily.  . famotidine (PEPCID) 20 MG tablet Take 20 mg by mouth daily.  . fluticasone (FLONASE) 50 MCG/ACT nasal spray Place 2 sprays into both nostrils daily.  . hydrogen peroxide 1.5 % SOLN  Use 15 ml orally, swish and spit as needed for dry mouth.  Marland Kitchen ipratropium (ATROVENT) 0.06 % nasal spray Place 2 sprays into both nostrils 2 (two) times daily as needed for rhinitis.  Prudencio Burly ([place]) [place] by mouth. Take 1 in the morning.  . lactose free nutrition (BOOST PLUS) LIQD Take 237 mLs by mouth 2 (two) times daily with a meal. Per request of resident, states can't eat if takes it later   . lidocaine-prilocaine (EMLA) cream Apply 1 application topically daily as needed.  . loratadine (CLARITIN) 10 MG tablet Take 10 mg by mouth  daily as needed for allergies.  . magnesium hydroxide (MILK OF MAGNESIA) 400 MG/5ML suspension Take by mouth daily as needed for mild constipation.  . Menthol, Topical Analgesic, (BIOFREEZE ROLL-ON) 4 % GEL Apply 1 application topically every 8 (eight) hours. Apply to LLE  . ondansetron (ZOFRAN) 4 MG tablet Take 4 mg by mouth every 6 (six) hours as needed for nausea or vomiting.  Marland Kitchen Peppermint Oil (IBGARD) 90 MG CPCR Take 2 capsules by mouth 2 (two) times daily.   . polyethylene glycol (MIRALAX / GLYCOLAX) packet Take 17 g daily as needed by mouth (for constipation).   . pregabalin (LYRICA) 25 MG capsule Take one capsule by mouth once daily every morning  . pregabalin (LYRICA) 50 MG capsule Take 1 capsule (50 mg total) by mouth at bedtime.  Marland Kitchen Propylene Glycol (SYSTANE BALANCE) 0.6 % SOLN Give 1 drop to both eyes once daily as needed for dryness  . sennosides-docusate sodium (SENOKOT-S) 8.6-50 MG tablet Take 2 tablets by mouth daily.   . sodium phosphate (FLEET) 7-19 GM/118ML ENEM Place 1 enema rectally as needed for severe constipation. Give Fleet enema per rectum X 1 after removing hard stool or if soft stool is present in rectum.  . traMADol (ULTRAM) 50 MG tablet Take 50 mg by mouth daily. As needed  . triamcinolone cream (KENALOG) 0.1 % Apply 1 application topically as needed. Apply to itchy areas as needed. May keep in room and self apply  . [DISCONTINUED] traMADol (ULTRAM) 50 MG tablet Take 1 tablet (50 mg total) by mouth 2 (two) times daily.   No facility-administered encounter medications on file as of 05/16/2019.    Review of Systems:  Review of Systems  Review of Systems  Constitutional: Negative for activity change, appetite change, chills, diaphoresis, fatigue and fever.  HENT: Negative for mouth sores, postnasal drip, rhinorrhea, sinus pain and sore throat.   Respiratory: Negative for apnea, cough, chest tightness, shortness of breath and wheezing.   Cardiovascular: Negative for  chest pain, palpitations and leg swelling.  Gastrointestinal: Negative for abdominal distention, abdominal pain, constipation, diarrhea, nausea and vomiting.  Genitourinary: Negative for dysuria and frequency.  Musculoskeletal: Negative for arthralgias, joint swelling and myalgias.  Skin: Negative for rash.  Neurological: Negative for dizziness, syncope, weakness, light-headedness and numbness.  Psychiatric/Behavioral: Negative for behavioral problems, confusion and sleep disturbance.     Health Maintenance  Topic Date Due  . INFLUENZA VACCINE  08/12/2019  . TETANUS/TDAP  11/01/2028  . DEXA SCAN  Completed  . COVID-19 Vaccine  Completed  . PNA vac Low Risk Adult  Completed    Physical Exam: Vitals:   05/16/19 1105  BP: 138/63  Pulse: 68  Resp: 18  Temp: (!) 97.3 F (36.3 C)  SpO2: 95%  Weight: 98 lb 9.6 oz (44.7 kg)  Height: 5\' 2"  (1.575 m)   Body mass index is 18.03  kg/m. Physical Exam  Constitutional: Oriented to person, place, and time. Well-developed and well-nourished.  HENT:  Head: Normocephalic.  Mouth/Throat: Oropharynx is clear and moist.  Eyes: Pupils are equal, round, and reactive to light.  Neck: Neck supple.  Cardiovascular: Normal rate and normal heart sounds.  No murmur heard. Pulmonary/Chest: Effort normal and breath sounds normal. No respiratory distress. No wheezes. She has no rales.  Abdominal: Soft. Bowel sounds are normal. No distension. There is no tenderness. There is no rebound.  Musculoskeletal: No edema.  Lymphadenopathy: none Neurological: Alert and oriented to person, place, and time.  Mental status seems at baseline to me Responding Appropriately Skin: Skin is warm and dry.  Psychiatric: Normal mood and affect. Behavior is normal. Thought content normal.    Labs reviewed: Basic Metabolic Panel: Recent Labs    12/28/18 0000 02/08/19 0000  NA 139  --   K 4.6  --   CL 105  --   CO2 29*  --   BUN 17  --   CREATININE 0.8  --     CALCIUM 9.0  --   TSH 5.11 3.16   Liver Function Tests: Recent Labs    12/28/18 0000  AST 23  ALT 13  ALKPHOS 68  ALBUMIN 3.5   No results for input(s): LIPASE, AMYLASE in the last 8760 hours. No results for input(s): AMMONIA in the last 8760 hours. CBC: Recent Labs    12/28/18 0000  WBC 6.2  NEUTROABS 3,398  HGB 12.5  HCT 36  PLT 202   Lipid Panel: No results for input(s): CHOL, HDL, LDLCALC, TRIG, CHOLHDL, LDLDIRECT in the last 8760 hours. Lab Results  Component Value Date   HGBA1C 5.8 12/28/2018    Procedures since last visit: OCT, Retina - OU - Both Eyes  Result Date: 04/27/2019 Right Eye Quality was good. Central Foveal Thickness: 179. Progression has improved. Findings include retinal drusen , inner retinal atrophy, intraretinal hyper-reflective material, abnormal foveal contour, subretinal fluid, outer retinal atrophy, no IRF (interval resolution of IRF/cystic changes ). Left Eye Quality was good. Central Foveal Thickness: 227. Progression has been stable. Findings include normal foveal contour, no SRF, no IRF, retinal drusen , outer retinal atrophy (Mild patchy ORA; no significant change from prior). Notes *Images captured and stored on drive Diagnosis / Impression: non-exu ARMD OU OD: CRVO; interval resolution of IRF/cystic changes OS: NFP; no IRF/SRF; No significant change from prior. Clinical management: See below Abbreviations: NFP - Normal foveal profile. CME - cystoid macular edema. PED - pigment epithelial detachment. IRF - intraretinal fluid. SRF - subretinal fluid. EZ - ellipsoid zone. ERM - epiretinal membrane. ORA - outer retinal atrophy. ORT - outer retinal tubulation. SRHM - subretinal hyper-reflective material    Assessment/Plan Weakness and Confusion Seems at baseline to me. No Signs of infection Will order CBC and CMP  Other Issues Food Impaction S/P Dilatation Doing well right now. On Mechanical Soft Diet Follow up with GI  Atrial  fibrillation with RVR (HCC) On Low dose of Lopressor Not on anticoagulation due to frailty and GI bleed  Neuropathy Symptoms are stable on Lyrica follows with neurology  Mild cognitive impairment Stays in AL.  Supportive care weight is stable Constipation Follows with GI but has been doing okay on IBgard and MiraLAX  Retinal Vein Oclusion with Macular edema Follows with ophthalmology States does not have good vision in 1 eye Arthritis On tramadol as needed  Dysphonia Will follow with ENT  Labs/tests ordered:  CBC,CMP Next  appt:  Visit date not found

## 2019-05-17 DIAGNOSIS — D649 Anemia, unspecified: Secondary | ICD-10-CM | POA: Diagnosis not present

## 2019-05-17 DIAGNOSIS — F0390 Unspecified dementia without behavioral disturbance: Secondary | ICD-10-CM | POA: Diagnosis not present

## 2019-05-17 LAB — CBC AND DIFFERENTIAL
HCT: 40 (ref 36–46)
Hemoglobin: 13.7 (ref 12.0–16.0)
Neutrophils Absolute: 3317
Platelets: 196 (ref 150–399)
WBC: 6.2

## 2019-05-17 LAB — BASIC METABOLIC PANEL
BUN: 14 (ref 4–21)
CO2: 33 — AB (ref 13–22)
Chloride: 102 (ref 99–108)
Creatinine: 0.9 (ref 0.5–1.1)
Glucose: 71
Potassium: 4.3 (ref 3.4–5.3)
Sodium: 139 (ref 137–147)

## 2019-05-17 LAB — CBC: RBC: 4.03 (ref 3.87–5.11)

## 2019-05-17 LAB — COMPREHENSIVE METABOLIC PANEL
Albumin: 3.5 (ref 3.5–5.0)
Calcium: 9.1 (ref 8.7–10.7)
Globulin: 2.5

## 2019-05-17 LAB — HEPATIC FUNCTION PANEL
ALT: 11 (ref 7–35)
AST: 22 (ref 13–35)
Alkaline Phosphatase: 66 (ref 25–125)
Bilirubin, Total: 0.6

## 2019-05-24 ENCOUNTER — Non-Acute Institutional Stay: Payer: Medicare Other | Admitting: Internal Medicine

## 2019-05-24 ENCOUNTER — Encounter: Payer: Self-pay | Admitting: Internal Medicine

## 2019-05-24 DIAGNOSIS — R1319 Other dysphagia: Secondary | ICD-10-CM

## 2019-05-24 DIAGNOSIS — L739 Follicular disorder, unspecified: Secondary | ICD-10-CM | POA: Diagnosis not present

## 2019-05-24 DIAGNOSIS — R1310 Dysphagia, unspecified: Secondary | ICD-10-CM

## 2019-05-24 DIAGNOSIS — I10 Essential (primary) hypertension: Secondary | ICD-10-CM | POA: Diagnosis not present

## 2019-05-24 DIAGNOSIS — I4891 Unspecified atrial fibrillation: Secondary | ICD-10-CM | POA: Diagnosis not present

## 2019-05-24 NOTE — Progress Notes (Deleted)
Location:  [place] Room Number: 1-A Place of Service:  ALF [id number]) Provider:  [name], MD  Patient Care Team: [name], MD as PCP - General (Internal Medicine) Angelia Mould, MD as Attending Physician (Vascular Surgery) [name], MD (Inactive) as Attending Physician (Obstetrics and Gynecology) [name], MD (Orthopedic Surgery) [name], MD (Ophthalmology) [name], MD as Consulting Physician (Urology) [name], MD as Consulting Physician (Cardiology) [name], PA-C as Physician Assistant (Otolaryngology) [name], MD as Consulting Physician (Otolaryngology) [name], MD as Consulting Physician (Dermatology) [name], MD as Consulting Physician (Physical Medicine and Rehabilitation) [name], MD as Attending Physician (Gastroenterology) Mast, Man X, NP as Nurse Practitioner (Internal Medicine) Kathrynn Ducking, MD as Consulting Physician (Neurology) Debara Pickett Nadean Corwin, MD as Consulting Physician (Cardiology) Druscilla Brownie, MD as Consulting Physician (Dermatology) [name], [name], NP as Nurse Practitioner (Family Medicine)  Extended Emergency Contact Information Primary Emergency Contact: [name] of [place] Phone: [phone] Mobile Phone: [phone] Relation: Son Secondary Emergency Contact: [name] Mobile Phone: [phone] Relation: Friend  Code Status:  FULL CODE Goals of care: Advanced Directive information Advanced Directives 05/07/2019  Does Patient Have a Medical Advance Directive? Yes  Type of Advance Directive [place]  Does patient want to make changes to medical advance directive? No - Patient declined  Copy of [place] in Chart? -  Would patient like information on creating a medical advance directive? -     Chief Complaint  Patient presents with  . Acute Visit    Patient is seen  for a vaginal lesion.     HPI:  Pt is a 86 y.o. female seen today for an acute visit for    Past Medical History:  Diagnosis Date  . Abdominal bloating   . Atrial fibrillation ([place])   . Bruises easily   . Carotid artery occlusion    LEFT  . Cricopharyngeal dysphagia   . Dizziness   . DOE (dyspnea on exertion) 04/02/2014  . Fall at home Sept 2013, Dec. 2013  Jun 08, 2012  . GERD (gastroesophageal reflux disease) 02/11/2015  . Headache(784.0)   . Hoarseness   . Hypercholesterolemia   . Hyperglycemia 04/02/2014   Glucose 178 mg percent on 01/22/2014 04/05/14 Hgb A1c 6.6 Diet controlled.    . Hypertensive retinopathy    OU  . Hypothyroidism 04/15/2015  . Neuropathy    PERIPHERAL  . Pruritus   . Scoliosis   . Varicose veins   . Zenker's diverticulum    Past Surgical History:  Procedure Laterality Date  . ABDOMINAL HYSTERECTOMY  1954  . CATARACT EXTRACTION Bilateral   . CHOLECYSTECTOMY  1997  . ESOPHAGOGASTRODUODENOSCOPY N/A 03/30/2019   Procedure: ESOPHAGOGASTRODUODENOSCOPY (EGD);  Surgeon: Jerene Bears, MD;  Location: [facility];  Service: Gastroenterology;  Laterality: N/A;  . ESOPHAGOGASTRODUODENOSCOPY (EGD) WITH PROPOFOL N/A 11/25/2016   Procedure: ESOPHAGOGASTRODUODENOSCOPY (EGD) WITH PROPOFOL;  Surgeon: [name], MD;  Location: [facility];  Service: Endoscopy;  Laterality: N/A;  . EYE SURGERY Bilateral    Cat Sx  . SPINE SURGERY  1997   correct scoliosis    No Known Allergies  Outpatient Encounter Medications as of 05/24/2019  Medication Sig  . acetaminophen (TYLENOL) 325 MG tablet Take 650 mg by mouth every 8 (eight) hours as needed.  Marland Kitchen acetaminophen (TYLENOL) 500 MG tablet Take 500 mg by mouth 2 (two) times daily.   . Artificial  Saliva (SALIVAMAX) PACK Use as directed 1 Package in the mouth or throat. mix with 62mL's of H20; swish and spit BID; may keep in bathroom; may self administer.  Marland Kitchen aspirin 81 MG chewable tablet Chew 81 mg by mouth daily.  Marland Kitchen  atenolol (TENORMIN) 25 MG tablet Take 12.5 mg by mouth 2 (two) times daily. Hold if SBP < 100 or HR < 60  . Biotin 5000 MCG TABS Take 5,000 mcg by mouth daily.  . famotidine (PEPCID) 20 MG tablet Take 20 mg by mouth daily.  . fluticasone (FLONASE) 50 MCG/ACT nasal spray Place 2 sprays into both nostrils daily.  . hydrogen peroxide 1.5 % SOLN Use 15 ml orally, swish and spit as needed for dry mouth.  Marland Kitchen ipratropium (ATROVENT) 0.06 % nasal spray Place 2 sprays into both nostrils 2 (two) times daily as needed for rhinitis.  Prudencio Burly ([place]) [place] by mouth. Take 1 in the morning.  . lactose free nutrition (BOOST PLUS) LIQD Take 237 mLs by mouth 2 (two) times daily with a meal. Per request of resident, states can't eat if takes it later   . lidocaine-prilocaine (EMLA) cream Apply 1 application topically daily as needed.  . loratadine (CLARITIN) 10 MG tablet Take 10 mg by mouth daily as needed for allergies.  . Menthol, Topical Analgesic, (BIOFREEZE ROLL-ON) 4 % GEL Apply 1 application topically every 8 (eight) hours. Apply to LLE  . ondansetron (ZOFRAN) 4 MG tablet Take 4 mg by mouth every 6 (six) hours as needed for nausea or vomiting.  Marland Kitchen Peppermint Oil (IBGARD) 90 MG CPCR Take 2 capsules by mouth 2 (two) times daily.   . polyethylene glycol (MIRALAX / GLYCOLAX) packet Take 17 g daily as needed by mouth (for constipation).   . pregabalin (LYRICA) 25 MG capsule Take one capsule by mouth once daily every morning  . pregabalin (LYRICA) 50 MG capsule Take 1 capsule (50 mg total) by mouth at bedtime.  Marland Kitchen Propylene Glycol (SYSTANE BALANCE) 0.6 % SOLN Give 1 drop to both eyes once daily as needed for dryness  . sennosides-docusate sodium (SENOKOT-S) 8.6-50 MG tablet Take 2 tablets by mouth daily.   . sodium phosphate (FLEET) 7-19 GM/118ML ENEM Place 1 enema rectally as needed for severe constipation. Give Fleet enema per rectum X 1 after removing hard stool or if soft stool  is present in rectum.  . traMADol (ULTRAM) 50 MG tablet Take 50 mg by mouth daily. As needed  . traMADol (ULTRAM) 50 MG tablet Take 1 tablet (50 mg total) by mouth 2 (two) times daily.  Marland Kitchen triamcinolone cream (KENALOG) 0.1 % Apply 1 application topically as needed. Apply to itchy areas as needed. May keep in room and self apply   No facility-administered encounter medications on file as of 05/24/2019.    Review of Systems  Immunization History  Administered Date(s) Administered  . Influenza Split 10/20/2011, 11/03/2012  . Influenza, High Dose Seasonal PF 10/24/2018  . Influenza,inj,Quad PF,6+ Mos 09/29/2012, 11/09/2013  . Influenza-Unspecified 10/17/2014, 10/23/2015, 10/20/2016, 10/17/2017  . Moderna SARS-COVID-2 Vaccination 01/15/2019, 02/12/2019  . PPD Test 08/21/2013, 03/04/2014  . Pneumococcal Conjugate-13 08/21/2013  . Pneumococcal Polysaccharide-23 09/08/2016  . Tdap 05/20/2008  . Tetanus 11/02/2018  . Zoster 11/01/2012   Pertinent  Health Maintenance Due  Topic Date Due  . INFLUENZA VACCINE  08/12/2019  . DEXA SCAN  Completed  . PNA vac Low Risk Adult  Completed   Fall Risk  11/04/2017 09/05/2017 09/22/2016 08/25/2016 05/31/2016  Falls in the past year? Yes Yes Yes Yes No  Number falls in past yr: 2 or more 2 or more 1 2 or more -  Comment - - - - -  Injury with Fall? Yes Yes Yes Yes -  Comment - - fx 3 ribs - -  Risk Factor Category  - - - - -  Risk for fall due to : - - Impaired mobility;Impaired balance/gait - -  Risk for fall due to: Comment - - - - -   Functional Status Survey:    Vitals:   05/24/19 1528  BP: (!) 168/80  Pulse: 78  Resp: 20  Temp: (!) 97 F (36.1 C)  TempSrc: Oral  SpO2: 96%  Weight: 98 lb 9.6 oz (44.7 kg)  Height: 5\' 2"  (1.575 m)   Body mass index is 18.03 kg/m. Physical Exam  Labs reviewed: Recent Labs    12/28/18 0000  NA 139  K 4.6  CL 105  CO2 29*  BUN 17  CREATININE 0.8  CALCIUM 9.0   Recent Labs    12/28/18 0000    AST 23  ALT 13  ALKPHOS 68  ALBUMIN 3.5   Recent Labs    12/28/18 0000  WBC 6.2  NEUTROABS 3,398  HGB 12.5  HCT 36  PLT 202   Lab Results  Component Value Date   TSH 3.16 02/08/2019   Lab Results  Component Value Date   HGBA1C 5.8 12/28/2018   Lab Results  Component Value Date   CHOL 187 11/03/2017   HDL 84 (A) 11/03/2017   LDLCALC 80 11/03/2017   TRIG 136 11/03/2017   CHOLHDL 2.1 07/29/2016    Significant Diagnostic Results in last 30 days:  OCT, Retina - OU - Both Eyes  Result Date: 04/27/2019 Right Eye Quality was good. Central Foveal Thickness: 179. Progression has improved. Findings include retinal drusen , inner retinal atrophy, intraretinal hyper-reflective material, abnormal foveal contour, subretinal fluid, outer retinal atrophy, no IRF (interval resolution of IRF/cystic changes ). Left Eye Quality was good. Central Foveal Thickness: 227. Progression has been stable. Findings include normal foveal contour, no SRF, no IRF, retinal drusen , outer retinal atrophy (Mild patchy ORA; no significant change from prior). Notes *Images captured and stored on drive Diagnosis / Impression: non-exu ARMD OU OD: CRVO; interval resolution of IRF/cystic changes OS: NFP; no IRF/SRF; No significant change from prior. Clinical management: See below Abbreviations: NFP - Normal foveal profile. CME - cystoid macular edema. PED - pigment epithelial detachment. IRF - intraretinal fluid. SRF - subretinal fluid. EZ - ellipsoid zone. ERM - epiretinal membrane. ORA - outer retinal atrophy. ORT - outer retinal tubulation. SRHM - subretinal hyper-reflective material    Assessment/Plan There are no diagnoses linked to this encounter.   Family/ staff Communication: ***  Labs/tests ordered:  ***

## 2019-05-24 NOTE — Progress Notes (Signed)
Location:    [place] Room Number: 03/05/2014 Place of Service:  ALF [phone]) Provider:  Veleta Miners MD   [name], MD  Patient Care Team: [name], MD as PCP - General (Internal Medicine) Angelia Mould, MD as Attending Physician (Vascular Surgery) [name], MD (Inactive) as Attending Physician (Obstetrics and Gynecology) [name], MD (Orthopedic Surgery) [name], MD (Ophthalmology) [name], MD as Consulting Physician (Urology) [name], MD as Consulting Physician (Cardiology) [name], PA-C as Physician Assistant (Otolaryngology) [name], MD as Consulting Physician (Otolaryngology) [name], MD as Consulting Physician (Dermatology) [name], MD as Consulting Physician (Physical Medicine and Rehabilitation) [name], MD as Attending Physician (Gastroenterology) Mast, Man X, NP as Nurse Practitioner (Internal Medicine) Kathrynn Ducking, MD as Consulting Physician (Neurology) Debara Pickett Nadean Corwin, MD as Consulting Physician (Cardiology) Druscilla Brownie, MD as Consulting Physician (Dermatology) [name], [name], NP as Nurse Practitioner (Family Medicine)  Extended Emergency Contact Information Primary Emergency Contact: [name] of [place] Phone: [phone] Mobile Phone: [phone] Relation: Son Secondary Emergency Contact: [name] Mobile Phone: [phone] Relation: Friend  Code Status:  Full Code Goals of care: Advanced Directive information Advanced Directives 05/07/2019  Does Patient Have a Medical Advance Directive? Yes  Type of Advance Directive [place]  Does patient want to make changes to medical advance directive? No - Patient declined  Copy of [place] in Chart? -  Would patient like information on creating a medical advance directive? -     Chief Complaint  Patient presents with  .  Acute Visit    Swelling in Groin area    HPI:  Pt is a 86 y.o. female seen today for an acute visit for Swelling in Groin area  Patient has h/ochronic atrial fibrillation not on any coagulation, GERD, hypothyroidism, hyperlipidemia, neuropathy,, cognitive impairment, H/o Syncope, h/o IBS and Esophageal Stenosis s/p Dilatation. She also Has H/o Retinal Vein Occlusion with Edema S/P Vitreal Injections  Patient c/o Swelling in her Groin area. Small area above her Vagina. It does not hurt. Or Itch. Had some bleeding from that are yesterday  Past Medical History:  Diagnosis Date  . Abdominal bloating   . Atrial fibrillation ([place])   . Bruises easily   . Carotid artery occlusion    LEFT  . Cricopharyngeal dysphagia   . Dizziness   . DOE (dyspnea on exertion) 04/02/2014  . Fall at home Sept 2013, Dec. 2013  Jun 08, 2012  . GERD (gastroesophageal reflux disease) 02/11/2015  . Headache(784.0)   . Hoarseness   . Hypercholesterolemia   . Hyperglycemia 04/02/2014   Glucose 178 mg percent on 01/22/2014 04/05/14 Hgb A1c 6.6 Diet controlled.    . Hypertensive retinopathy    OU  . Hypothyroidism 04/15/2015  . Neuropathy    PERIPHERAL  . Pruritus   . Scoliosis   . Varicose veins   . Zenker's diverticulum    Past Surgical History:  Procedure Laterality Date  . ABDOMINAL HYSTERECTOMY  1954  . CATARACT EXTRACTION Bilateral   . CHOLECYSTECTOMY  1997  . ESOPHAGOGASTRODUODENOSCOPY N/A 03/30/2019   Procedure: ESOPHAGOGASTRODUODENOSCOPY (EGD);  Surgeon: Jerene Bears, MD;  Location: [facility];  Service: Gastroenterology;  Laterality: N/A;  . ESOPHAGOGASTRODUODENOSCOPY (EGD) WITH PROPOFOL N/A 11/25/2016   Procedure: ESOPHAGOGASTRODUODENOSCOPY (EGD) WITH PROPOFOL;  Surgeon: [name], MD;  Location: [facility];  Service: Endoscopy;  Laterality: N/A;  . EYE SURGERY Bilateral  Cat Sx  . SPINE SURGERY  1997   correct scoliosis    No Known Allergies  Allergies as of 05/24/2019     No Known Allergies     Medication List       Accurate as of May 24, 2019 10:59 AM. If you have any questions, ask your nurse or doctor.        STOP taking these medications   magnesium hydroxide 400 MG/5ML suspension Commonly known as: MILK OF MAGNESIA Stopped by: [name], MD     TAKE these medications   acetaminophen 500 MG tablet Commonly known as: TYLENOL Take 500 mg by mouth 2 (two) times daily.   acetaminophen 325 MG tablet Commonly known as: TYLENOL Take 650 mg by mouth every 8 (eight) hours as needed.   aspirin 81 MG chewable tablet Chew 81 mg by mouth daily.   atenolol 25 MG tablet Commonly known as: TENORMIN Take 12.5 mg by mouth 2 (two) times daily. Hold if SBP < 100 or HR < 60   Biofreeze Roll-On 4 % Gel Generic drug: Menthol (Topical Analgesic) Apply 1 application topically every 8 (eight) hours. Apply to LLE   Biotin 5000 MCG Tabs Take 5,000 mcg by mouth daily.   famotidine 20 MG tablet Commonly known as: PEPCID Take 20 mg by mouth daily.   fluticasone 50 MCG/ACT nasal spray Commonly known as: FLONASE Place 2 sprays into both nostrils daily.   hydrogen peroxide 1.5 % Soln Use 15 ml orally, swish and spit as needed for dry mouth.   IBgard 90 MG Cpcr Generic drug: Peppermint Oil Take 2 capsules by mouth 2 (two) times daily.   ipratropium 0.06 % nasal spray Commonly known as: ATROVENT Place 2 sprays into both nostrils 2 (two) times daily as needed for rhinitis.   lactose free nutrition Liqd Take 237 mLs by mouth 2 (two) times daily with a meal. Per request of resident, states can't eat if takes it later   lidocaine-prilocaine cream Commonly known as: EMLA Apply 1 application topically daily as needed.   loratadine 10 MG tablet Commonly known as: CLARITIN Take 10 mg by mouth daily as needed for allergies.   ondansetron 4 MG tablet Commonly known as: ZOFRAN Take 4 mg by mouth every 6 (six) hours as needed for nausea or  vomiting.   polyethylene glycol 17 g packet Commonly known as: MIRALAX / GLYCOLAX Take 17 g daily as needed by mouth (for constipation).   pregabalin 25 MG capsule Commonly known as: Lyrica Take one capsule by mouth once daily every morning   pregabalin 50 MG capsule Commonly known as: LYRICA Take 1 capsule (50 mg total) by mouth at bedtime.   SalivaMAX Pack Use as directed 1 Package in the mouth or throat. mix with 51mL's of H20; swish and spit BID; may keep in bathroom; may self administer.   sennosides-docusate sodium 8.6-50 MG tablet Commonly known as: SENOKOT-S Take 2 tablets by mouth daily.   sodium phosphate 7-19 GM/118ML Enem Place 1 enema rectally as needed for severe constipation. Give Fleet enema per rectum X 1 after removing hard stool or if soft stool is present in rectum.   Systane Balance 0.6 % Soln Generic drug: Propylene Glycol Give 1 drop to both eyes once daily as needed for dryness   traMADol 50 MG tablet Commonly known as: ULTRAM Take 50 mg by mouth daily. As needed   traMADol 50 MG tablet Commonly known as: ULTRAM Take 1 tablet (50 mg  total) by mouth 2 (two) times daily.   triamcinolone cream 0.1 % Commonly known as: KENALOG Apply 1 application topically as needed. Apply to itchy areas as needed. May keep in room and self apply   [facility] by mouth. Take 1 in the morning.       Review of Systems  Constitutional: Negative.  Negative for activity change.  HENT: Negative.   Respiratory: Negative.   Cardiovascular: Negative.   Gastrointestinal: Negative.   Genitourinary: Negative.   Musculoskeletal: Negative.   Neurological: Negative.   Psychiatric/Behavioral: Positive for confusion and dysphoric mood. Negative for agitation.  All other systems reviewed and are negative.   Immunization History  Administered Date(s) Administered  . Influenza Split 10/20/2011, 11/03/2012  . Influenza, High Dose Seasonal PF  10/24/2018  . Influenza,inj,Quad PF,6+ Mos 09/29/2012, 11/09/2013  . Influenza-Unspecified 10/17/2014, 10/23/2015, 10/20/2016, 10/17/2017  . Moderna SARS-COVID-2 Vaccination 01/15/2019, 02/12/2019  . PPD Test 08/21/2013, 03/04/2014  . Pneumococcal Conjugate-13 08/21/2013  . Pneumococcal Polysaccharide-23 09/08/2016  . Tdap 05/20/2008  . Tetanus 11/02/2018  . Zoster 11/01/2012   Pertinent  Health Maintenance Due  Topic Date Due  . INFLUENZA VACCINE  08/12/2019  . DEXA SCAN  Completed  . PNA vac Low Risk Adult  Completed   Fall Risk  11/04/2017 09/05/2017 09/22/2016 08/25/2016 05/31/2016  Falls in the past year? Yes Yes Yes Yes No  Number falls in past yr: 2 or more 2 or more 1 2 or more -  Comment - - - - -  Injury with Fall? Yes Yes Yes Yes -  Comment - - fx 3 ribs - -  Risk Factor Category  - - - - -  Risk for fall due to : - - Impaired mobility;Impaired balance/gait - -  Risk for fall due to: Comment - - - - -   Functional Status Survey:    Vitals:   05/24/19 1049  BP: (!) 168/80  Pulse: 78  Resp: 20  Temp: (!) 97 F (36.1 C)  SpO2: 96%  Weight: 98 lb 9.6 oz (44.7 kg)  Height: 5\' 2"  (1.575 m)   Body mass index is 18.03 kg/m. Physical Exam Vitals reviewed.  Constitutional:      Appearance: Normal appearance.  HENT:     Head: Normocephalic.     Nose: Nose normal.     Mouth/Throat:     Mouth: Mucous membranes are moist.     Pharynx: Oropharynx is clear.  Eyes:     Pupils: Pupils are equal, round, and reactive to light.  Cardiovascular:     Rate and Rhythm: Regular rhythm.  Pulmonary:     Effort: Pulmonary effort is normal.     Breath sounds: Normal breath sounds.  Abdominal:     General: Abdomen is flat. Bowel sounds are normal.     Palpations: Abdomen is soft.  Musculoskeletal:        General: No swelling.     Cervical back: Neck supple.  Skin:    Comments: Folliculitis in outer area of her Vagina  Neurological:     General: No focal deficit present.      Mental Status: She is alert.     Labs reviewed: Recent Labs    12/28/18 0000  NA 139  K 4.6  CL 105  CO2 29*  BUN 17  CREATININE 0.8  CALCIUM 9.0   Recent Labs    12/28/18 0000  AST 23  ALT 13  ALKPHOS 68  ALBUMIN 3.5  Recent Labs    12/28/18 0000  WBC 6.2  NEUTROABS 3,398  HGB 12.5  HCT 36  PLT 202   Lab Results  Component Value Date   TSH 3.16 02/08/2019   Lab Results  Component Value Date   HGBA1C 5.8 12/28/2018   Lab Results  Component Value Date   CHOL 187 11/03/2017   HDL 84 (A) 11/03/2017   LDLCALC 80 11/03/2017   TRIG 136 11/03/2017   CHOLHDL 2.1 07/29/2016    Significant Diagnostic Results in last 30 days:  OCT, Retina - OU - Both Eyes  Result Date: 04/27/2019 Right Eye Quality was good. Central Foveal Thickness: 179. Progression has improved. Findings include retinal drusen , inner retinal atrophy, intraretinal hyper-reflective material, abnormal foveal contour, subretinal fluid, outer retinal atrophy, no IRF (interval resolution of IRF/cystic changes ). Left Eye Quality was good. Central Foveal Thickness: 227. Progression has been stable. Findings include normal foveal contour, no SRF, no IRF, retinal drusen , outer retinal atrophy (Mild patchy ORA; no significant change from prior). Notes *Images captured and stored on drive Diagnosis / Impression: non-exu ARMD OU OD: CRVO; interval resolution of IRF/cystic changes OS: NFP; no IRF/SRF; No significant change from prior. Clinical management: See below Abbreviations: NFP - Normal foveal profile. CME - cystoid macular edema. PED - pigment epithelial detachment. IRF - intraretinal fluid. SRF - subretinal fluid. EZ - ellipsoid zone. ERM - epiretinal membrane. ORA - outer retinal atrophy. ORT - outer retinal tubulation. SRHM - subretinal hyper-reflective material    Assessment/Plan Folliculitis Reassured patient  Will do Bactroban and Hydrocortisone BID for few days Hypertension BP elevated  today and few more readings in Matrix Will Check BP QD for 2 weeks and evaluate On Tenormin only for her A Fib  Other Issues Food Impaction S/P Dilatation Doing well right now. On Mechanical Soft Diet Follow up with GI  Atrial fibrillation with RVR (HCC) On Low dose of Atenolol Not on anticoagulation due to frailty and GI bleed  Neuropathy Symptoms are stable on Lyrica follows with neurology  Mild cognitive impairment Stays in [place]. Supportive care weight is stable Constipation Follows with GI but has been doing okay on IBgard and MiraLAX  Retinal Vein Oclusion with Macular edema Follows with ophthalmology States does not have good vision in 1 eye Arthritis On tramadol as needed  Dysphonia Will follow with ENT  Family/ staff Communication:   Labs/tests ordered:

## 2019-05-25 NOTE — Progress Notes (Signed)
This encounter was created in error - please disregard.

## 2019-05-28 ENCOUNTER — Other Ambulatory Visit: Payer: Self-pay | Admitting: *Deleted

## 2019-05-28 NOTE — Telephone Encounter (Signed)
Received fax from [place] [facility] and sent to Dr. Lyndel Safe for approval.

## 2019-05-29 MED ORDER — PREGABALIN 50 MG PO CAPS: 50.0000 mg | ORAL_CAPSULE | Freq: Every day | ORAL | 3 refills | Status: DC

## 2019-06-01 ENCOUNTER — Non-Acute Institutional Stay: Payer: Medicare Other | Admitting: Internal Medicine

## 2019-06-01 ENCOUNTER — Encounter: Payer: Self-pay | Admitting: Internal Medicine

## 2019-06-01 ENCOUNTER — Other Ambulatory Visit: Payer: Self-pay

## 2019-06-01 DIAGNOSIS — I4891 Unspecified atrial fibrillation: Secondary | ICD-10-CM | POA: Diagnosis not present

## 2019-06-01 DIAGNOSIS — R1310 Dysphagia, unspecified: Secondary | ICD-10-CM | POA: Diagnosis not present

## 2019-06-01 DIAGNOSIS — R4689 Other symptoms and signs involving appearance and behavior: Secondary | ICD-10-CM | POA: Diagnosis not present

## 2019-06-01 DIAGNOSIS — R1319 Other dysphagia: Secondary | ICD-10-CM

## 2019-06-01 DIAGNOSIS — R4189 Other symptoms and signs involving cognitive functions and awareness: Secondary | ICD-10-CM

## 2019-06-01 DIAGNOSIS — I10 Essential (primary) hypertension: Secondary | ICD-10-CM

## 2019-06-01 MED ORDER — PREGABALIN 25 MG PO CAPS: ORAL_CAPSULE | ORAL | 0 refills | Status: DC

## 2019-06-01 NOTE — Progress Notes (Signed)
Location:    [place] Room Number: 1 Place of Service:  ALF [phone]) Provider:  Veleta Miners MD   [name], MD  Patient Care Team: [name], MD as PCP - General (Internal Medicine) Angelia Mould, MD as Attending Physician (Vascular Surgery) [name], MD (Inactive) as Attending Physician (Obstetrics and Gynecology) [name], MD (Orthopedic Surgery) [name], MD (Ophthalmology) [name], MD as Consulting Physician (Urology) [name], MD as Consulting Physician (Cardiology) [name], PA-C as Physician Assistant (Otolaryngology) [name], MD as Consulting Physician (Otolaryngology) [name], MD as Consulting Physician (Dermatology) [name], MD as Consulting Physician (Physical Medicine and Rehabilitation) [name], MD as Attending Physician (Gastroenterology) Mast, Man X, NP as Nurse Practitioner (Internal Medicine) Kathrynn Ducking, MD as Consulting Physician (Neurology) Debara Pickett Nadean Corwin, MD as Consulting Physician (Cardiology) Druscilla Brownie, MD as Consulting Physician (Dermatology) [name], [name], NP as Nurse Practitioner (Family Medicine)  Extended Emergency Contact Information Primary Emergency Contact: [name] of [place] Phone: [phone] Mobile Phone: [phone] Relation: Son Secondary Emergency Contact: [name] Mobile Phone: [phone] Relation: Friend  Code Status:  Full Code Goals of care: Advanced Directive information Advanced Directives 06/01/2019  Does Patient Have a Medical Advance Directive? Yes  Type of Advance Directive Living will;Healthcare Power of Attorney  Does patient want to make changes to medical advance directive? No - Patient declined  Copy of [place] in Chart? Yes - validated most recent copy scanned in chart (See row information)  Would patient like information on creating a medical  advance directive? -     Chief Complaint  Patient presents with  . Acute Visit    Change in behavior    HPI:  Pt is a 86 y.o. female seen today for an acute visit for continued complaint of confusion weakness and some behavior changes especially in the morning.  Patient has h/ochronic atrial fibrillation not on any coagulation, GERD, hypothyroidism, hyperlipidemia, neuropathy,, cognitive impairment, H/o Syncope, h/o IBS and Esophageal Stenosis s/p Dilatation. She also Has H/o Retinal Vein Occlusion with Edema S/P Vitreal Injections  Staff continues to complain the patient is more confused.  This morning she was also having some symptoms in which she was shaking did not know how to dress up and do her basic ADLs.  It has also been noticed by physical therapy and speech that she has had some cognitive decline. Patient herself seems at baseline.  Said that she did just had a bad mornings.  Her mental status seems at baseline.  She continues to walk with a walker.  Needing some assist with her dressing. Her weight is stable.  No dysphagia.  No fever dysuria abdominal pain cough. Past Medical History:  Diagnosis Date  . Abdominal bloating   . Atrial fibrillation ([place])   . Bruises easily   . Carotid artery occlusion    LEFT  . Cricopharyngeal dysphagia   . Dizziness   . DOE (dyspnea on exertion) 04/02/2014  . Fall at home Sept 2013, Dec. 2013  Jun 08, 2012  . GERD (gastroesophageal reflux disease) 02/11/2015  . Headache(784.0)   . Hoarseness   . Hypercholesterolemia   . Hyperglycemia 04/02/2014   Glucose 178 mg percent on 01/22/2014 04/05/14 Hgb A1c 6.6 Diet controlled.    . Hypertensive retinopathy    OU  . Hypothyroidism 04/15/2015  . Neuropathy    PERIPHERAL  . Pruritus   . Scoliosis   .  Varicose veins   . Zenker's diverticulum    Past Surgical History:  Procedure Laterality Date  . ABDOMINAL HYSTERECTOMY  1954  . CATARACT EXTRACTION Bilateral   . CHOLECYSTECTOMY  1997  .  ESOPHAGOGASTRODUODENOSCOPY N/A 03/30/2019   Procedure: ESOPHAGOGASTRODUODENOSCOPY (EGD);  Surgeon: Jerene Bears, MD;  Location: [facility];  Service: Gastroenterology;  Laterality: N/A;  . ESOPHAGOGASTRODUODENOSCOPY (EGD) WITH PROPOFOL N/A 11/25/2016   Procedure: ESOPHAGOGASTRODUODENOSCOPY (EGD) WITH PROPOFOL;  Surgeon: [name], MD;  Location: [facility];  Service: Endoscopy;  Laterality: N/A;  . EYE SURGERY Bilateral    Cat Sx  . SPINE SURGERY  1997   correct scoliosis    No Known Allergies  Allergies as of 06/01/2019   No Known Allergies     Medication List       Accurate as of Jun 01, 2019 11:33 AM. If you have any questions, ask your nurse or doctor.        STOP taking these medications   multivitamin with minerals tablet Stopped by: [name], MD     TAKE these medications   acetaminophen 500 MG tablet Commonly known as: TYLENOL Take 500 mg by mouth 2 (two) times daily.   acetaminophen 325 MG tablet Commonly known as: TYLENOL Take 650 mg by mouth every 8 (eight) hours as needed.   aspirin 81 MG chewable tablet Chew 81 mg by mouth daily.   atenolol 25 MG tablet Commonly known as: TENORMIN Take 12.5 mg by mouth 2 (two) times daily. Hold if SBP < 100 or HR < 60   Biofreeze Roll-On 4 % Gel Generic drug: Menthol (Topical Analgesic) Apply 1 application topically every 8 (eight) hours. Apply to LLE   Biotin 5000 MCG Tabs Take 5,000 mcg by mouth daily.   famotidine 20 MG tablet Commonly known as: PEPCID Take 20 mg by mouth daily.   fluticasone 50 MCG/ACT nasal spray Commonly known as: FLONASE Place 2 sprays into both nostrils daily.   hydrogen peroxide 1.5 % Soln Use 15 ml orally, swish and spit as needed for dry mouth.   IBgard 90 MG Cpcr Generic drug: Peppermint Oil Take 2 capsules by mouth 2 (two) times daily.   ipratropium 0.06 % nasal spray Commonly known as: ATROVENT Place 2 sprays into both nostrils 2 (two) times daily as needed  for rhinitis.   lactose free nutrition Liqd Take 237 mLs by mouth 2 (two) times daily with a meal. Per request of resident, states can't eat if takes it later   lidocaine-prilocaine cream Commonly known as: EMLA Apply 1 application topically daily as needed.   loratadine 10 MG tablet Commonly known as: CLARITIN Take 10 mg by mouth daily as needed for allergies.   ondansetron 4 MG tablet Commonly known as: ZOFRAN Take 4 mg by mouth every 6 (six) hours as needed for nausea or vomiting.   polyethylene glycol 17 g packet Commonly known as: MIRALAX / GLYCOLAX Take 17 g daily as needed by mouth (for constipation).   pregabalin 25 MG capsule Commonly known as: Lyrica Take one capsule by mouth once daily every morning   pregabalin 50 MG capsule Commonly known as: LYRICA Take 1 capsule (50 mg total) by mouth at bedtime.   SalivaMAX Pack Use as directed 1 Package in the mouth or throat. mix with 47mL's of H20; swish and spit BID; may keep in bathroom; may self administer.   sennosides-docusate sodium 8.6-50 MG tablet Commonly known as: SENOKOT-S Take 2 tablets by mouth daily.  sodium phosphate 7-19 GM/118ML Enem Place 1 enema rectally as needed for severe constipation. Give Fleet enema per rectum X 1 after removing hard stool or if soft stool is present in rectum.   Systane Balance 0.6 % Soln Generic drug: Propylene Glycol Give 1 drop to both eyes once daily as needed for dryness   traMADol 50 MG tablet Commonly known as: ULTRAM Take 50 mg by mouth daily as needed.   traMADol 50 MG tablet Commonly known as: ULTRAM Take 1 tablet (50 mg total) by mouth 2 (two) times daily.   triamcinolone cream 0.1 % Commonly known as: KENALOG Apply 1 application topically as needed. Apply to itchy areas as needed. May keep in room and self apply   [facility] by mouth. Take 1 in the morning.       Review of Systems  Review of Systems  Constitutional:  Negative for activity change, appetite change, chills, diaphoresis, fatigue and fever.  HENT: Negative for mouth sores, postnasal drip, rhinorrhea, sinus pain and sore throat.   Respiratory: Negative for apnea, cough, chest tightness, shortness of breath and wheezing.   Cardiovascular: Negative for chest pain, palpitations and leg swelling.  Gastrointestinal: Negative for abdominal distention, abdominal pain, constipation, diarrhea, nausea and vomiting.  Genitourinary: Negative for dysuria and frequency.  Musculoskeletal: Negative for arthralgias, joint swelling and myalgias.  Skin: Negative for rash.  Neurological: Negative for dizziness, syncope, weakness, light-headedness and numbness.  Psychiatric/Behavioral: Negative for behavioral problems, confusion and sleep disturbance.     Immunization History  Administered Date(s) Administered  . Influenza Split 10/20/2011, 11/03/2012  . Influenza, High Dose Seasonal PF 10/24/2018  . Influenza,inj,Quad PF,6+ Mos 09/29/2012, 11/09/2013  . Influenza-Unspecified 10/17/2014, 10/23/2015, 10/20/2016, 10/17/2017  . Moderna SARS-COVID-2 Vaccination 01/15/2019, 02/12/2019  . PPD Test 08/21/2013, 03/04/2014  . Pneumococcal Conjugate-13 08/21/2013  . Pneumococcal Polysaccharide-23 09/08/2016  . Tdap 05/20/2008  . Tetanus 11/02/2018  . Zoster 11/01/2012   Pertinent  Health Maintenance Due  Topic Date Due  . INFLUENZA VACCINE  08/12/2019  . DEXA SCAN  Completed  . PNA vac Low Risk Adult  Completed   Fall Risk  11/04/2017 09/05/2017 09/22/2016 08/25/2016 05/31/2016  Falls in the past year? Yes Yes Yes Yes No  Number falls in past yr: 2 or more 2 or more 1 2 or more -  Comment - - - - -  Injury with Fall? Yes Yes Yes Yes -  Comment - - fx 3 ribs - -  Risk Factor Category  - - - - -  Risk for fall due to : - - Impaired mobility;Impaired balance/gait - -  Risk for fall due to: Comment - - - - -   Functional Status Survey:    Vitals:   06/01/19  1100  BP: 118/70  Pulse: 74  Resp: 16  Temp: (!) 97 F (36.1 C)  SpO2: 96%  Weight: 98 lb 9.6 oz (44.7 kg)  Height: 5\' 2"  (1.575 m)   Body mass index is 18.03 kg/m. Physical Exam  Constitutional: Oriented to person, place, and time. Well-developed and well-nourished.  HENT:  Head: Normocephalic.  Mouth/Throat: Oropharynx is clear and moist.  Eyes: Pupils are equal, round, and reactive to light.  Neck: Neck supple.  Cardiovascular: Normal rate and normal heart sounds.  No murmur heard. Pulmonary/Chest: Effort normal and breath sounds normal. No respiratory distress. No wheezes. She has no rales.  Abdominal: Soft. Bowel sounds are normal. No distension. There is no tenderness. There is  no rebound.  Musculoskeletal: No edema.  Lymphadenopathy: none Neurological: Alert and oriented to person, place, and time.  Skin: Skin is warm and dry.  Psychiatric: Normal mood and affect. Behavior is normal. Thought content normal.    Labs reviewed: Recent Labs    12/28/18 0000 05/17/19 0000  NA 139 139  K 4.6 4.3  CL 105 102  CO2 29* 33*  BUN 17 14  CREATININE 0.8 0.9  CALCIUM 9.0 9.1   Recent Labs    12/28/18 0000 05/17/19 0000  AST 23 22  ALT 13 11  ALKPHOS 68 66  ALBUMIN 3.5 3.5   Recent Labs    12/28/18 0000 05/17/19 0000  WBC 6.2 6.2  NEUTROABS 3,398 3,317  HGB 12.5 13.7  HCT 36 40  PLT 202 196   Lab Results  Component Value Date   TSH 3.16 02/08/2019   Lab Results  Component Value Date   HGBA1C 5.8 12/28/2018   Lab Results  Component Value Date   CHOL 187 11/03/2017   HDL 84 (A) 11/03/2017   LDLCALC 80 11/03/2017   TRIG 136 11/03/2017   CHOLHDL 2.1 07/29/2016    Significant Diagnostic Results in last 30 days:  No results found.  Assessment/Plan Cognitive and behavioral changes D/W the Speech and Nurses They have noticed some new changes in the patient. We will get MMSE Decrease the dose of Lyrica to 25 twice daily We will consider  decreasing the dose of tramadol. It can be just a general decline due to her age Weakness Patient has been noticed to feel very weak in the morning Her labs were completely normal done few weeks ago No signs of infection Will check CBG in the mornings Decreasing dose of Lyrica   Other stable issues  Food Impaction S/P Dilatation Doing well right now. On Mechanical Soft Diet Follow up with GI  Atrial fibrillation with RVR ([facility]) On Low dose of Lopressor Not on anticoagulation due to frailty and GI bleed  Neuropathy Symptoms are stable on Lyrica follows with neurology  Mild cognitive impairment Stays in AL. Supportive care weight is stable Constipation Follows with GI but has been doing okay on IBgard and MiraLAX  Retinal Vein Oclusion with Macular edema Follows with ophthalmology States does not have good vision in 1 eye Arthritis On tramadol as needed  Dysphonia Will follow with ENT    Family/ staff Communication:   Labs/tests ordered:

## 2019-06-07 ENCOUNTER — Ambulatory Visit (INDEPENDENT_AMBULATORY_CARE_PROVIDER_SITE_OTHER): Payer: Medicare Other | Admitting: Nurse Practitioner

## 2019-06-07 ENCOUNTER — Encounter: Payer: Self-pay | Admitting: Nurse Practitioner

## 2019-06-07 VITALS — BP 104/74 | HR 66 | Ht 61.0 in | Wt 96.0 lb

## 2019-06-07 DIAGNOSIS — R1310 Dysphagia, unspecified: Secondary | ICD-10-CM

## 2019-06-07 DIAGNOSIS — R634 Abnormal weight loss: Secondary | ICD-10-CM | POA: Diagnosis not present

## 2019-06-07 NOTE — Progress Notes (Signed)
I agree with the above note, plan.  I think trying to avoid invasive testing at her age and with her myriad comorbid conditions is a good plan unless an urgent or emergent situation arises. 

## 2019-06-07 NOTE — Patient Instructions (Signed)
Elmyra Ricks from [facility] is going to have the physical therapy department call us with information we need in regards to your swallowing and weight loss.   I appreciate the opportunity to care for you. Tye Savoy, NP-C

## 2019-06-07 NOTE — Progress Notes (Signed)
IMPRESSION and PLAN:    # Worsening of chronic dysphagia with recent food impaction (March 2021)   --Known stenosis of esophagus with tight cricopharyngeal muscle but also stenosis involving GE junction.  --Patient here for follow-up after emergent EGD March 2021 for food impaction.  Unfortunately she came alone today from [facility] and is confused and unable to provide.   --With patient in clinic we called [facility].  Nurse Elmyra Ricks believes patient is having ongoing dysphagia and sounds like her weight is down about 5 pounds since January --I asked Nurse to call and speak with patient's speech therapist ( or have SLP call me) with additional information such as:  Is patient still having episodes of dysphagia and if so how often?  Is she chewing well, eating slowly? Is it taking her so long to eat/so much effort that she is losing interest leading to the weight loss?  Does she swallow liquids okay? -- In the interim I will downgrade her to puree diet. --If we confirm ongoing dysphagia, especially with associated weight loss, she may need to stay on a pured diet indefinitely. EGD with dilation may not be beneficial or too high risk but I will discuss with Dr. Ardis Hughs, patient's primary GI.  --Weight loss could be multifactorial (declining cognitive function)   HPI:    Primary GI: Oretha Caprice, MD       Chief complaint :  [place]essa [name] is known to Dr. Ardis Hughs. She has a history of  dysphagia felt to be secondary to tight cricopharyngeal muscle and Zenker's diverticulum.  She also has a history of esophageal dysmotility.  03/30/19 ED visit -patient choked on a piece of lettuce.  Glucagon did not help.  Dr. Hilarie Fredrickson saw the patient in the ED. she had an EGD showing 2 benign-appearing intrinsic moderate stenosis at the cricopharyngeus and GE junction.  The areas were traversed by the pediatric scope.  There was a food bolus in the proximal stomach which likely migrated with intubation of the soft.    HISTORY SINCE LAST VISIT: Patient is here today unaccompanied by family members nor [facility] from [facility].  Patient is confused, cannot provide history.  She has lost her purse. Regarding her swallowing, patient says she is swallowing okay.  Patient says she is working with [facility] Astr[facility].   While patient was in the office we called [facility] to try and obtain some information about patient's current swallowing.  I spoke to nurse Elmyra Ricks .  Patient is on a soft diet , she is only able to tolerate 1 supplemental boost a day. She has lost about 5 pounds since January. Nurse unsure about etiology of weight loss but does believe patient is having ongoing problems swallowing.  Staff has recently noticed cognitive decline in patient and they are planning to meet with the son to discuss.    Data Reviewed:  05/17/2019 CMP normal WBC normal, hemoglobin normal at 13.7  Previous Endoscopic Evaluations: 03/30/19 EGD for food impaction Two benign-appearing, intrinsic moderate (circumferential scarring or stenosis; an endoscope may pass)stenoses were found at the cricopharyngeus and GE junction. The stenoses were traversed with the pediatric endoscope. Findings: The esophagus was otherwise without abnormality. There was a food bolus in the proximal stomach, which likely migrated with intubation of the esophagus. The stomach was otherwise normal.  Review of systems:     No chest pain, no SOB, no fevers, no urinary sx   Past Medical History:  Diagnosis Date  . Abdominal bloating   . Atrial fibrillation ([place])   . Bruises easily   . Carotid artery occlusion    LEFT  . Cricopharyngeal dysphagia   . Dizziness   . DOE (dyspnea on exertion) 04/02/2014  . Fall at home Sept 2013, Dec. 2013  Jun 08, 2012  . GERD (gastroesophageal reflux disease) 02/11/2015  . Headache(784.0)   . Hoarseness   . Hypercholesterolemia   . Hyperglycemia 04/02/2014   Glucose 178 mg  percent on 01/22/2014 04/05/14 Hgb A1c 6.6 Diet controlled.    . Hypertensive retinopathy    OU  . Hypothyroidism 04/15/2015  . Neuropathy    PERIPHERAL  . Pruritus   . Scoliosis   . Varicose veins   . Zenker's diverticulum     Patient's surgical history, family medical history, social history, medications and allergies were all reviewed in Epic   Creatinine clearance cannot be calculated (Patient's most recent lab result is older than the maximum 21 days allowed.)  Current Outpatient Medications  Medication Sig Dispense Refill  . acetaminophen (TYLENOL) 325 MG tablet Take 650 mg by mouth every 8 (eight) hours as needed.    Marland Kitchen acetaminophen (TYLENOL) 500 MG tablet Take 500 mg by mouth 2 (two) times daily.     . Artificial Saliva (SALIVAMAX) PACK Use as directed 1 Package in the mouth or throat. mix with 80mL's of H20; swish and spit BID; may keep in bathroom; may self administer.    Marland Kitchen aspirin 81 MG chewable tablet Chew 81 mg by mouth daily.    Marland Kitchen atenolol (TENORMIN) 25 MG tablet Take 12.5 mg by mouth 2 (two) times daily. Hold if SBP < 100 or HR < 60    . Biotin 5000 MCG TABS Take 5,000 mcg by mouth daily.    . famotidine (PEPCID) 20 MG tablet Take 20 mg by mouth daily.    . fluticasone (FLONASE) 50 MCG/ACT nasal spray Place 2 sprays into both nostrils daily.    . hydrogen peroxide 1.5 % SOLN Use 15 ml orally, swish and spit as needed for dry mouth.    Marland Kitchen ipratropium (ATROVENT) 0.06 % nasal spray Place 2 sprays into both nostrils 2 (two) times daily as needed for rhinitis.    Prudencio Burly (Penelope) McBride by mouth. Take 1 in the morning.    . lactose free nutrition (BOOST PLUS) LIQD Take 237 mLs by mouth 2 (two) times daily with a meal. Per request of resident, states can't eat if takes it later     . lidocaine-prilocaine (EMLA) cream Apply 1 application topically daily as needed.    . loratadine (CLARITIN) 10 MG tablet Take 10 mg by mouth daily as needed for  allergies.    . Menthol, Topical Analgesic, (BIOFREEZE ROLL-ON) 4 % GEL Apply 1 application topically every 8 (eight) hours. Apply to LLE    . ondansetron (ZOFRAN) 4 MG tablet Take 4 mg by mouth every 6 (six) hours as needed for nausea or vomiting.    Marland Kitchen Peppermint Oil (IBGARD) 90 MG CPCR Take 2 capsules by mouth 2 (two) times daily.     . polyethylene glycol (MIRALAX / GLYCOLAX) packet Take 17 g daily as needed by mouth (for constipation).     . pregabalin (LYRICA) 25 MG capsule Take one capsule by mouth twice daily 60 capsule 0  . Propylene Glycol (SYSTANE BALANCE) 0.6 % SOLN Give 1 drop to both eyes once daily as needed for dryness    .  sennosides-docusate sodium (SENOKOT-S) 8.6-50 MG tablet Take 2 tablets by mouth daily.     . sodium phosphate (FLEET) 7-19 GM/118ML ENEM Place 1 enema rectally as needed for severe constipation. Give Fleet enema per rectum X 1 after removing hard stool or if soft stool is present in rectum.    . traMADol (ULTRAM) 50 MG tablet Take 50 mg by mouth daily as needed.     . traMADol (ULTRAM) 50 MG tablet Take 1 tablet (50 mg total) by mouth 2 (two) times daily. 60 tablet 0  . triamcinolone cream (KENALOG) 0.1 % Apply 1 application topically as needed. Apply to itchy areas as needed. May keep in room and self apply     No current facility-administered medications for this visit.    Physical Exam:     BP 104/74   Pulse 66   Ht 5\' 1"  (1.549 m)   Wt 96 lb (43.5 kg)   BMI 18.14 kg/m   GENERAL:  Pleasant female in NAD PSYCH: : Cooperative, normal affect CARDIAC:  RRR PULM: Normal respiratory effort, lungs CTA bilaterally, no wheezing ABDOMEN:  Nondistended, soft, nontender. No obvious masses, no hepatomegaly,  normal bowel sounds SKIN:  turgor, no lesions seen Musculoskeletal:  Normal muscle tone, normal strength NEURO: Alert and oriented x 3, no focal neurologic deficits  I spent 30 minutes total reviewing records, obtaining history, performing exam,  counseling patient and documenting visit / findings.   Tye Savoy , NP 06/07/2019, 10:13 AM

## 2019-06-12 DIAGNOSIS — I482 Chronic atrial fibrillation, unspecified: Secondary | ICD-10-CM | POA: Diagnosis not present

## 2019-06-12 DIAGNOSIS — M6281 Muscle weakness (generalized): Secondary | ICD-10-CM | POA: Diagnosis not present

## 2019-06-12 DIAGNOSIS — R2681 Unsteadiness on feet: Secondary | ICD-10-CM | POA: Diagnosis not present

## 2019-06-12 DIAGNOSIS — R479 Unspecified speech disturbances: Secondary | ICD-10-CM | POA: Diagnosis not present

## 2019-06-12 DIAGNOSIS — R1312 Dysphagia, oropharyngeal phase: Secondary | ICD-10-CM | POA: Diagnosis not present

## 2019-06-12 DIAGNOSIS — M545 Low back pain: Secondary | ICD-10-CM | POA: Diagnosis not present

## 2019-06-13 DIAGNOSIS — M545 Low back pain: Secondary | ICD-10-CM | POA: Diagnosis not present

## 2019-06-13 DIAGNOSIS — R2681 Unsteadiness on feet: Secondary | ICD-10-CM | POA: Diagnosis not present

## 2019-06-13 DIAGNOSIS — M6281 Muscle weakness (generalized): Secondary | ICD-10-CM | POA: Diagnosis not present

## 2019-06-13 DIAGNOSIS — R1312 Dysphagia, oropharyngeal phase: Secondary | ICD-10-CM | POA: Diagnosis not present

## 2019-06-13 DIAGNOSIS — R479 Unspecified speech disturbances: Secondary | ICD-10-CM | POA: Diagnosis not present

## 2019-06-13 DIAGNOSIS — I482 Chronic atrial fibrillation, unspecified: Secondary | ICD-10-CM | POA: Diagnosis not present

## 2019-06-14 DIAGNOSIS — I482 Chronic atrial fibrillation, unspecified: Secondary | ICD-10-CM | POA: Diagnosis not present

## 2019-06-14 DIAGNOSIS — R2681 Unsteadiness on feet: Secondary | ICD-10-CM | POA: Diagnosis not present

## 2019-06-14 DIAGNOSIS — M6281 Muscle weakness (generalized): Secondary | ICD-10-CM | POA: Diagnosis not present

## 2019-06-14 DIAGNOSIS — R1312 Dysphagia, oropharyngeal phase: Secondary | ICD-10-CM | POA: Diagnosis not present

## 2019-06-14 DIAGNOSIS — R479 Unspecified speech disturbances: Secondary | ICD-10-CM | POA: Diagnosis not present

## 2019-06-14 DIAGNOSIS — M545 Low back pain: Secondary | ICD-10-CM | POA: Diagnosis not present

## 2019-06-15 DIAGNOSIS — R479 Unspecified speech disturbances: Secondary | ICD-10-CM | POA: Diagnosis not present

## 2019-06-15 DIAGNOSIS — M545 Low back pain: Secondary | ICD-10-CM | POA: Diagnosis not present

## 2019-06-15 DIAGNOSIS — M6281 Muscle weakness (generalized): Secondary | ICD-10-CM | POA: Diagnosis not present

## 2019-06-15 DIAGNOSIS — R1312 Dysphagia, oropharyngeal phase: Secondary | ICD-10-CM | POA: Diagnosis not present

## 2019-06-15 DIAGNOSIS — I482 Chronic atrial fibrillation, unspecified: Secondary | ICD-10-CM | POA: Diagnosis not present

## 2019-06-15 DIAGNOSIS — R2681 Unsteadiness on feet: Secondary | ICD-10-CM | POA: Diagnosis not present

## 2019-06-18 DIAGNOSIS — R1312 Dysphagia, oropharyngeal phase: Secondary | ICD-10-CM | POA: Diagnosis not present

## 2019-06-18 DIAGNOSIS — M545 Low back pain: Secondary | ICD-10-CM | POA: Diagnosis not present

## 2019-06-18 DIAGNOSIS — M6281 Muscle weakness (generalized): Secondary | ICD-10-CM | POA: Diagnosis not present

## 2019-06-18 DIAGNOSIS — R2681 Unsteadiness on feet: Secondary | ICD-10-CM | POA: Diagnosis not present

## 2019-06-18 DIAGNOSIS — I482 Chronic atrial fibrillation, unspecified: Secondary | ICD-10-CM | POA: Diagnosis not present

## 2019-06-18 DIAGNOSIS — R479 Unspecified speech disturbances: Secondary | ICD-10-CM | POA: Diagnosis not present

## 2019-06-19 DIAGNOSIS — R1312 Dysphagia, oropharyngeal phase: Secondary | ICD-10-CM | POA: Diagnosis not present

## 2019-06-19 DIAGNOSIS — R479 Unspecified speech disturbances: Secondary | ICD-10-CM | POA: Diagnosis not present

## 2019-06-19 DIAGNOSIS — M545 Low back pain: Secondary | ICD-10-CM | POA: Diagnosis not present

## 2019-06-19 DIAGNOSIS — I482 Chronic atrial fibrillation, unspecified: Secondary | ICD-10-CM | POA: Diagnosis not present

## 2019-06-19 DIAGNOSIS — M6281 Muscle weakness (generalized): Secondary | ICD-10-CM | POA: Diagnosis not present

## 2019-06-19 DIAGNOSIS — R2681 Unsteadiness on feet: Secondary | ICD-10-CM | POA: Diagnosis not present

## 2019-06-21 DIAGNOSIS — R479 Unspecified speech disturbances: Secondary | ICD-10-CM | POA: Diagnosis not present

## 2019-06-21 DIAGNOSIS — M545 Low back pain: Secondary | ICD-10-CM | POA: Diagnosis not present

## 2019-06-21 DIAGNOSIS — I482 Chronic atrial fibrillation, unspecified: Secondary | ICD-10-CM | POA: Diagnosis not present

## 2019-06-21 DIAGNOSIS — R2681 Unsteadiness on feet: Secondary | ICD-10-CM | POA: Diagnosis not present

## 2019-06-21 DIAGNOSIS — M6281 Muscle weakness (generalized): Secondary | ICD-10-CM | POA: Diagnosis not present

## 2019-06-21 DIAGNOSIS — R1312 Dysphagia, oropharyngeal phase: Secondary | ICD-10-CM | POA: Diagnosis not present

## 2019-06-22 DIAGNOSIS — I482 Chronic atrial fibrillation, unspecified: Secondary | ICD-10-CM | POA: Diagnosis not present

## 2019-06-22 DIAGNOSIS — M545 Low back pain: Secondary | ICD-10-CM | POA: Diagnosis not present

## 2019-06-22 DIAGNOSIS — R1312 Dysphagia, oropharyngeal phase: Secondary | ICD-10-CM | POA: Diagnosis not present

## 2019-06-22 DIAGNOSIS — M6281 Muscle weakness (generalized): Secondary | ICD-10-CM | POA: Diagnosis not present

## 2019-06-22 DIAGNOSIS — R479 Unspecified speech disturbances: Secondary | ICD-10-CM | POA: Diagnosis not present

## 2019-06-22 DIAGNOSIS — R2681 Unsteadiness on feet: Secondary | ICD-10-CM | POA: Diagnosis not present

## 2019-06-25 DIAGNOSIS — I482 Chronic atrial fibrillation, unspecified: Secondary | ICD-10-CM | POA: Diagnosis not present

## 2019-06-25 DIAGNOSIS — R1312 Dysphagia, oropharyngeal phase: Secondary | ICD-10-CM | POA: Diagnosis not present

## 2019-06-25 DIAGNOSIS — M6281 Muscle weakness (generalized): Secondary | ICD-10-CM | POA: Diagnosis not present

## 2019-06-25 DIAGNOSIS — R2681 Unsteadiness on feet: Secondary | ICD-10-CM | POA: Diagnosis not present

## 2019-06-25 DIAGNOSIS — R479 Unspecified speech disturbances: Secondary | ICD-10-CM | POA: Diagnosis not present

## 2019-06-25 DIAGNOSIS — M545 Low back pain: Secondary | ICD-10-CM | POA: Diagnosis not present

## 2019-06-26 DIAGNOSIS — R479 Unspecified speech disturbances: Secondary | ICD-10-CM | POA: Diagnosis not present

## 2019-06-26 DIAGNOSIS — R1312 Dysphagia, oropharyngeal phase: Secondary | ICD-10-CM | POA: Diagnosis not present

## 2019-06-26 DIAGNOSIS — M6281 Muscle weakness (generalized): Secondary | ICD-10-CM | POA: Diagnosis not present

## 2019-06-26 DIAGNOSIS — M545 Low back pain: Secondary | ICD-10-CM | POA: Diagnosis not present

## 2019-06-26 DIAGNOSIS — R2681 Unsteadiness on feet: Secondary | ICD-10-CM | POA: Diagnosis not present

## 2019-06-26 DIAGNOSIS — I482 Chronic atrial fibrillation, unspecified: Secondary | ICD-10-CM | POA: Diagnosis not present

## 2019-06-28 DIAGNOSIS — R479 Unspecified speech disturbances: Secondary | ICD-10-CM | POA: Diagnosis not present

## 2019-06-28 DIAGNOSIS — M6281 Muscle weakness (generalized): Secondary | ICD-10-CM | POA: Diagnosis not present

## 2019-06-28 DIAGNOSIS — I482 Chronic atrial fibrillation, unspecified: Secondary | ICD-10-CM | POA: Diagnosis not present

## 2019-06-28 DIAGNOSIS — M545 Low back pain: Secondary | ICD-10-CM | POA: Diagnosis not present

## 2019-06-28 DIAGNOSIS — R1312 Dysphagia, oropharyngeal phase: Secondary | ICD-10-CM | POA: Diagnosis not present

## 2019-06-28 DIAGNOSIS — R2681 Unsteadiness on feet: Secondary | ICD-10-CM | POA: Diagnosis not present

## 2019-07-02 DIAGNOSIS — M545 Low back pain: Secondary | ICD-10-CM | POA: Diagnosis not present

## 2019-07-02 DIAGNOSIS — R2681 Unsteadiness on feet: Secondary | ICD-10-CM | POA: Diagnosis not present

## 2019-07-02 DIAGNOSIS — I482 Chronic atrial fibrillation, unspecified: Secondary | ICD-10-CM | POA: Diagnosis not present

## 2019-07-02 DIAGNOSIS — R1312 Dysphagia, oropharyngeal phase: Secondary | ICD-10-CM | POA: Diagnosis not present

## 2019-07-02 DIAGNOSIS — R479 Unspecified speech disturbances: Secondary | ICD-10-CM | POA: Diagnosis not present

## 2019-07-02 DIAGNOSIS — M6281 Muscle weakness (generalized): Secondary | ICD-10-CM | POA: Diagnosis not present

## 2019-07-03 DIAGNOSIS — R2681 Unsteadiness on feet: Secondary | ICD-10-CM | POA: Diagnosis not present

## 2019-07-03 DIAGNOSIS — R479 Unspecified speech disturbances: Secondary | ICD-10-CM | POA: Diagnosis not present

## 2019-07-03 DIAGNOSIS — R1312 Dysphagia, oropharyngeal phase: Secondary | ICD-10-CM | POA: Diagnosis not present

## 2019-07-03 DIAGNOSIS — M545 Low back pain: Secondary | ICD-10-CM | POA: Diagnosis not present

## 2019-07-03 DIAGNOSIS — M6281 Muscle weakness (generalized): Secondary | ICD-10-CM | POA: Diagnosis not present

## 2019-07-03 DIAGNOSIS — I482 Chronic atrial fibrillation, unspecified: Secondary | ICD-10-CM | POA: Diagnosis not present

## 2019-07-04 DIAGNOSIS — R2681 Unsteadiness on feet: Secondary | ICD-10-CM | POA: Diagnosis not present

## 2019-07-04 DIAGNOSIS — R479 Unspecified speech disturbances: Secondary | ICD-10-CM | POA: Diagnosis not present

## 2019-07-04 DIAGNOSIS — M545 Low back pain: Secondary | ICD-10-CM | POA: Diagnosis not present

## 2019-07-04 DIAGNOSIS — R1312 Dysphagia, oropharyngeal phase: Secondary | ICD-10-CM | POA: Diagnosis not present

## 2019-07-04 DIAGNOSIS — M6281 Muscle weakness (generalized): Secondary | ICD-10-CM | POA: Diagnosis not present

## 2019-07-04 DIAGNOSIS — I482 Chronic atrial fibrillation, unspecified: Secondary | ICD-10-CM | POA: Diagnosis not present

## 2019-07-05 DIAGNOSIS — M6281 Muscle weakness (generalized): Secondary | ICD-10-CM | POA: Diagnosis not present

## 2019-07-05 DIAGNOSIS — I482 Chronic atrial fibrillation, unspecified: Secondary | ICD-10-CM | POA: Diagnosis not present

## 2019-07-05 DIAGNOSIS — R479 Unspecified speech disturbances: Secondary | ICD-10-CM | POA: Diagnosis not present

## 2019-07-05 DIAGNOSIS — M545 Low back pain: Secondary | ICD-10-CM | POA: Diagnosis not present

## 2019-07-05 DIAGNOSIS — R1312 Dysphagia, oropharyngeal phase: Secondary | ICD-10-CM | POA: Diagnosis not present

## 2019-07-05 DIAGNOSIS — R2681 Unsteadiness on feet: Secondary | ICD-10-CM | POA: Diagnosis not present

## 2019-07-06 ENCOUNTER — Encounter: Payer: Self-pay | Admitting: Nurse Practitioner

## 2019-07-06 ENCOUNTER — Non-Acute Institutional Stay: Payer: Medicare Other | Admitting: Nurse Practitioner

## 2019-07-06 DIAGNOSIS — R1310 Dysphagia, unspecified: Secondary | ICD-10-CM | POA: Diagnosis not present

## 2019-07-06 DIAGNOSIS — I4891 Unspecified atrial fibrillation: Secondary | ICD-10-CM

## 2019-07-06 DIAGNOSIS — J383 Other diseases of vocal cords: Secondary | ICD-10-CM

## 2019-07-06 DIAGNOSIS — G629 Polyneuropathy, unspecified: Secondary | ICD-10-CM | POA: Diagnosis not present

## 2019-07-06 DIAGNOSIS — M159 Polyosteoarthritis, unspecified: Secondary | ICD-10-CM

## 2019-07-06 DIAGNOSIS — K5900 Constipation, unspecified: Secondary | ICD-10-CM

## 2019-07-06 NOTE — Assessment & Plan Note (Signed)
s/p dilatation food impaction, f/u GI, takes Famotidine 20mg  qd, mechanical soft diet.

## 2019-07-06 NOTE — Assessment & Plan Note (Signed)
Stable, continue Tylenol, prn Tramadol.

## 2019-07-06 NOTE — Assessment & Plan Note (Signed)
Stable, continue  IB Gard, MiraLax prn, Senokot S II qd  

## 2019-07-06 NOTE — Assessment & Plan Note (Signed)
Heart rate is in control, continue Atenolol.  

## 2019-07-06 NOTE — Progress Notes (Signed)
Location:    [place] Room Number: 1 Place of Service:  ALF ([phone]) Provider:  Marda Stalker, Lennie Odor NP   [name], MD  Patient Care Team: [name], MD as PCP - General (Internal Medicine) Angelia Mould, MD as Attending Physician (Vascular Surgery) [name], MD (Inactive) as Attending Physician (Obstetrics and Gynecology) [name], MD (Orthopedic Surgery) [name], MD (Ophthalmology) [name], MD as Consulting Physician (Urology) [name], MD as Consulting Physician (Cardiology) [name], PA-C as Physician Assistant (Otolaryngology) [name], MD as Consulting Physician (Otolaryngology) [name], MD as Consulting Physician (Dermatology) [name], MD as Consulting Physician (Physical Medicine and Rehabilitation) [name], MD as Attending Physician (Gastroenterology) Mast, Man X, NP as Nurse Practitioner (Internal Medicine) Kathrynn Ducking, MD as Consulting Physician (Neurology) Debara Pickett Nadean Corwin, MD as Consulting Physician (Cardiology) Druscilla Brownie, MD as Consulting Physician (Dermatology) [name], [name], NP as Nurse Practitioner (Family Medicine)  Extended Emergency Contact Information Primary Emergency Contact: [name] of [place] Phone: [phone] Mobile Phone: [phone] Relation: Son Secondary Emergency Contact: [name] Mobile Phone: [phone] Relation: Friend  Code Status:  Full Code Goals of care: Advanced Directive information Advanced Directives 06/01/2019  Does Patient Have a Medical Advance Directive? Yes  Type of Advance Directive Living will;Healthcare Power of Attorney  Does patient want to make changes to medical advance directive? No - Patient declined  Copy of [place] in Chart? Yes - validated most recent copy scanned in chart (See row information)  Would patient like information on creating a medical  advance directive? -     Chief Complaint  Patient presents with  . Medical Management of Chronic Issues    HPI:  Pt is a 86 y.o. female seen today for medical management of chronic diseases.   Cognitive deficit, MMSE  Peripheral neuropathy, decreased Lyrica to 25mg  bid 06/01/19  Dysphagia, s/p dilatation food impaction, f/u GI, takes Famotidine 20mg  qd  Afib, takes Atenolol 12.5mg  bid  Constipation, takes IB Gard, MiraLax prn, Senokot S II qd  Dysphonia, chronic, ENT  OA, takes Tylenol 500mg  bid, prn Tramadol.     Past Medical History:  Diagnosis Date  . Abdominal bloating   . Atrial fibrillation ([place])   . Bruises easily   . Carotid artery occlusion    LEFT  . Cricopharyngeal dysphagia   . Dizziness   . DOE (dyspnea on exertion) 04/02/2014  . Fall at home Sept 2013, Dec. 2013  Jun 08, 2012  . GERD (gastroesophageal reflux disease) 02/11/2015  . Headache(784.0)   . Hoarseness   . Hypercholesterolemia   . Hyperglycemia 04/02/2014   Glucose 178 mg percent on 01/22/2014 04/05/14 Hgb A1c 6.6 Diet controlled.    . Hypertensive retinopathy    OU  . Hypothyroidism 04/15/2015  . Neuropathy    PERIPHERAL  . Pruritus   . Scoliosis   . Varicose veins   . Zenker's diverticulum    Past Surgical History:  Procedure Laterality Date  . ABDOMINAL HYSTERECTOMY  1954  . CATARACT EXTRACTION Bilateral   . CHOLECYSTECTOMY  1997  . ESOPHAGOGASTRODUODENOSCOPY N/A 03/30/2019   Procedure: ESOPHAGOGASTRODUODENOSCOPY (EGD);  Surgeon: Jerene Bears, MD;  Location: [facility];  Service: Gastroenterology;  Laterality: N/A;  . ESOPHAGOGASTRODUODENOSCOPY (EGD) WITH PROPOFOL N/A 11/25/2016   Procedure: ESOPHAGOGASTRODUODENOSCOPY (EGD) WITH PROPOFOL;  Surgeon: [name], MD;  Location: [facility];  Service: Endoscopy;  Laterality: N/A;  . EYE SURGERY Bilateral  Cat Sx  . SPINE SURGERY  1997   correct scoliosis    No Known Allergies  Allergies as of 07/06/2019   No Known Allergies      Medication List       Accurate as of July 06, 2019 11:59 PM. If you have any questions, ask your nurse or doctor.        acetaminophen 500 MG tablet Commonly known as: TYLENOL Take 500 mg by mouth 2 (two) times daily.   acetaminophen 325 MG tablet Commonly known as: TYLENOL Take 650 mg by mouth every 8 (eight) hours as needed.   aspirin 81 MG chewable tablet Chew 81 mg by mouth daily.   atenolol 25 MG tablet Commonly known as: TENORMIN Take 12.5 mg by mouth 2 (two) times daily. Hold if SBP < 100 or HR < 60   Biofreeze Roll-On 4 % Gel Generic drug: Menthol (Topical Analgesic) Apply 1 application topically every 8 (eight) hours. Apply to LLE   Biotin 5000 MCG Tabs Take 5,000 mcg by mouth daily.   famotidine 20 MG tablet Commonly known as: PEPCID Take 20 mg by mouth daily.   fluticasone 50 MCG/ACT nasal spray Commonly known as: FLONASE Place 2 sprays into both nostrils daily.   hydrogen peroxide 1.5 % Soln Use 15 ml orally, swish and spit as needed for dry mouth.   IBgard 90 MG Cpcr Generic drug: Peppermint Oil Take 2 capsules by mouth 2 (two) times daily.   ipratropium 0.06 % nasal spray Commonly known as: ATROVENT Place 2 sprays into both nostrils 2 (two) times daily as needed for rhinitis.   lactose free nutrition Liqd Take 237 mLs by mouth 2 (two) times daily with a meal. Per request of resident, states can't eat if takes it later   lidocaine-prilocaine cream Commonly known as: EMLA Apply 1 application topically daily as needed.   loratadine 10 MG tablet Commonly known as: CLARITIN Take 10 mg by mouth daily as needed for allergies.   ondansetron 4 MG tablet Commonly known as: ZOFRAN Take 4 mg by mouth every 6 (six) hours as needed for nausea or vomiting.   polyethylene glycol 17 g packet Commonly known as: MIRALAX / GLYCOLAX Take 17 g daily as needed by mouth (for constipation).   pregabalin 25 MG capsule Commonly known as: Lyrica Take one  capsule by mouth twice daily   SalivaMAX Pack Use as directed 1 Package in the mouth or throat. mix with 52mL's of H20; swish and spit BID; may keep in bathroom; may self administer.   sennosides-docusate sodium 8.6-50 MG tablet Commonly known as: SENOKOT-S Take 2 tablets by mouth daily.   sodium phosphate 7-19 GM/118ML Enem Place 1 enema rectally as needed for severe constipation. Give Fleet enema per rectum X 1 after removing hard stool or if soft stool is present in rectum.   Systane Balance 0.6 % Soln Generic drug: Propylene Glycol Give 1 drop to both eyes once daily as needed for dryness   traMADol 50 MG tablet Commonly known as: ULTRAM Take 50 mg by mouth daily as needed.   traMADol 50 MG tablet Commonly known as: ULTRAM Take 1 tablet (50 mg total) by mouth 2 (two) times daily.   triamcinolone cream 0.1 % Commonly known as: KENALOG Apply 1 application topically as needed. Apply to itchy areas as needed. May keep in room and self apply   [facility] by mouth. Take 1 in the morning.   VIACTIV PO Take  1 tablet by mouth. Once a morning.       Review of Systems  Constitutional: Negative for fatigue, fever and unexpected weight change.  HENT: Positive for hearing loss and trouble swallowing. Negative for congestion and voice change.   Eyes: Positive for visual disturbance.  Respiratory: Negative for cough and shortness of breath.   Cardiovascular: Negative for leg swelling.  Gastrointestinal: Negative for abdominal pain, constipation, nausea and vomiting.       Gagging.   Genitourinary: Negative for difficulty urinating, dysuria and urgency.  Musculoskeletal: Positive for arthralgias, back pain and gait problem.  Skin: Negative for color change and pallor.  Neurological: Negative for facial asymmetry, speech difficulty, weakness and light-headedness.  Psychiatric/Behavioral: Negative for behavioral problems and sleep disturbance. The patient is  not nervous/anxious.     Immunization History  Administered Date(s) Administered  . Influenza Split 10/20/2011, 11/03/2012  . Influenza, High Dose Seasonal PF 10/24/2018  . Influenza,inj,Quad PF,6+ Mos 09/29/2012, 11/09/2013  . Influenza-Unspecified 10/17/2014, 10/23/2015, 10/20/2016, 10/17/2017  . Moderna SARS-COVID-2 Vaccination 01/15/2019, 02/12/2019  . PPD Test 08/21/2013, 03/04/2014  . Pneumococcal Conjugate-13 08/21/2013  . Pneumococcal Polysaccharide-23 09/08/2016  . Tdap 05/20/2008  . Tetanus 11/02/2018  . Zoster 11/01/2012   Pertinent  Health Maintenance Due  Topic Date Due  . INFLUENZA VACCINE  08/12/2019  . DEXA SCAN  Completed  . PNA vac Low Risk Adult  Completed   Fall Risk  11/04/2017 09/05/2017 09/22/2016 08/25/2016 05/31/2016  Falls in the past year? Yes Yes Yes Yes No  Number falls in past yr: 2 or more 2 or more 1 2 or more -  Comment - - - - -  Injury with Fall? Yes Yes Yes Yes -  Comment - - fx 3 ribs - -  Risk Factor Category  - - - - -  Risk for fall due to : - - Impaired mobility;Impaired balance/gait - -  Risk for fall due to: Comment - - - - -   Functional Status Survey:    Vitals:   07/06/19 0930  BP: (!) 150/88  Pulse: 66  Resp: (!) 22  Temp: 97.7 F (36.5 C)  SpO2: 96%  Weight: 95 lb 6.4 oz (43.3 kg)  Height: 5\' 2"  (1.575 m)   Body mass index is 17.45 kg/m. Physical Exam Vitals and nursing note reviewed.  Constitutional:      Appearance: Normal appearance.  HENT:     Head: Normocephalic and atraumatic.     Nose: Nose normal. No congestion or rhinorrhea.     Mouth/Throat:     Mouth: Mucous membranes are moist.  Eyes:     General: Vision grossly intact. Gaze aligned appropriately. No allergic shiner or visual field deficit.       Right eye: No foreign body or hordeolum.        Left eye: No foreign body or hordeolum.     Extraocular Movements: Extraocular movements intact.     Conjunctiva/sclera: Conjunctivae normal.     Right eye:  Right conjunctiva is not injected. No exudate or hemorrhage.    Left eye: Left conjunctiva is not injected. No exudate or hemorrhage.    Pupils: Pupils are equal, round, and reactive to light.  Cardiovascular:     Rate and Rhythm: Normal rate. Rhythm irregular.     Heart sounds: No murmur heard.   Pulmonary:     Effort: Pulmonary effort is normal.     Breath sounds: No rales.  Abdominal:     General:  Bowel sounds are normal.     Palpations: Abdomen is soft.     Tenderness: There is no abdominal tenderness.  Musculoskeletal:     Cervical back: Normal range of motion and neck supple.     Right lower leg: No edema.     Left lower leg: No edema.  Skin:    General: Skin is warm and dry.  Neurological:     General: No focal deficit present.     Mental Status: She is alert. Mental status is at baseline.     Gait: Gait abnormal.     Comments: Oriented to person, place.   Psychiatric:        Mood and Affect: Mood normal.        Behavior: Behavior normal.        Thought Content: Thought content normal.        Judgment: Judgment normal.     Labs reviewed: Recent Labs    12/28/18 0000 05/17/19 0000  NA 139 139  K 4.6 4.3  CL 105 102  CO2 29* 33*  BUN 17 14  CREATININE 0.8 0.9  CALCIUM 9.0 9.1   Recent Labs    12/28/18 0000 05/17/19 0000  AST 23 22  ALT 13 11  ALKPHOS 68 66  ALBUMIN 3.5 3.5   Recent Labs    12/28/18 0000 05/17/19 0000  WBC 6.2 6.2  NEUTROABS 3,398 3,317  HGB 12.5 13.7  HCT 36 40  PLT 202 196   Lab Results  Component Value Date   TSH 3.16 02/08/2019   Lab Results  Component Value Date   HGBA1C 5.8 12/28/2018   Lab Results  Component Value Date   CHOL 187 11/03/2017   HDL 84 (A) 11/03/2017   LDLCALC 80 11/03/2017   TRIG 136 11/03/2017   CHOLHDL 2.1 07/29/2016    Significant Diagnostic Results in last 30 days:  No results found.  Assessment/Plan Atrial fibrillation with RVR (HCC) Heart rate is in control, continue Atenolol.    Atrophy of vocal cord Dysphonia, f/u ENT  Dysphagia s/p dilatation food impaction, f/u GI, takes Famotidine 20mg  qd, mechanical soft diet.    Neuropathy Stable, continue Lyrica 25mg  bid, it was decreased since 06/01/19  Osteoarthritis Stable, continue Tylenol, prn Tramadol.   Constipation Stable, continue IB Gard, MiraLax prn, Senokot S II qd      Family/ staff Communication: plan of care reviewed with the patient and charge nurse.   Labs/tests ordered:  none  Time spend 40 minutes.

## 2019-07-06 NOTE — Assessment & Plan Note (Signed)
Dysphonia, f/u ENT

## 2019-07-06 NOTE — Assessment & Plan Note (Signed)
Stable, continue Lyrica 25mg  bid, it was decreased since 06/01/19

## 2019-07-09 DIAGNOSIS — M545 Low back pain: Secondary | ICD-10-CM | POA: Diagnosis not present

## 2019-07-09 DIAGNOSIS — I482 Chronic atrial fibrillation, unspecified: Secondary | ICD-10-CM | POA: Diagnosis not present

## 2019-07-09 DIAGNOSIS — R1312 Dysphagia, oropharyngeal phase: Secondary | ICD-10-CM | POA: Diagnosis not present

## 2019-07-09 DIAGNOSIS — R2681 Unsteadiness on feet: Secondary | ICD-10-CM | POA: Diagnosis not present

## 2019-07-09 DIAGNOSIS — M6281 Muscle weakness (generalized): Secondary | ICD-10-CM | POA: Diagnosis not present

## 2019-07-09 DIAGNOSIS — R479 Unspecified speech disturbances: Secondary | ICD-10-CM | POA: Diagnosis not present

## 2019-07-10 ENCOUNTER — Encounter: Payer: Self-pay | Admitting: Nurse Practitioner

## 2019-07-11 DIAGNOSIS — R479 Unspecified speech disturbances: Secondary | ICD-10-CM | POA: Diagnosis not present

## 2019-07-11 DIAGNOSIS — M6281 Muscle weakness (generalized): Secondary | ICD-10-CM | POA: Diagnosis not present

## 2019-07-11 DIAGNOSIS — R1312 Dysphagia, oropharyngeal phase: Secondary | ICD-10-CM | POA: Diagnosis not present

## 2019-07-11 DIAGNOSIS — I482 Chronic atrial fibrillation, unspecified: Secondary | ICD-10-CM | POA: Diagnosis not present

## 2019-07-11 DIAGNOSIS — M545 Low back pain: Secondary | ICD-10-CM | POA: Diagnosis not present

## 2019-07-11 DIAGNOSIS — R2681 Unsteadiness on feet: Secondary | ICD-10-CM | POA: Diagnosis not present

## 2019-07-12 DIAGNOSIS — R479 Unspecified speech disturbances: Secondary | ICD-10-CM | POA: Diagnosis not present

## 2019-07-12 DIAGNOSIS — R1312 Dysphagia, oropharyngeal phase: Secondary | ICD-10-CM | POA: Diagnosis not present

## 2019-07-12 DIAGNOSIS — I482 Chronic atrial fibrillation, unspecified: Secondary | ICD-10-CM | POA: Diagnosis not present

## 2019-07-17 ENCOUNTER — Other Ambulatory Visit: Payer: Self-pay

## 2019-07-17 NOTE — Telephone Encounter (Signed)
Last filled in Epic on 05/16/2019

## 2019-07-18 MED ORDER — TRAMADOL HCL 50 MG PO TABS: 50.0000 mg | ORAL_TABLET | Freq: Two times a day (BID) | ORAL | 0 refills | Status: DC

## 2019-08-03 ENCOUNTER — Other Ambulatory Visit: Payer: Self-pay | Admitting: *Deleted

## 2019-08-03 MED ORDER — PREGABALIN 25 MG PO CAPS: ORAL_CAPSULE | ORAL | 0 refills | Status: DC

## 2019-08-03 NOTE — Telephone Encounter (Signed)
Received refill Request from [facility] and sent to [facility] for approval.

## 2019-08-16 ENCOUNTER — Other Ambulatory Visit: Payer: Self-pay | Admitting: *Deleted

## 2019-08-16 MED ORDER — TRAMADOL HCL 50 MG PO TABS: 50.0000 mg | ORAL_TABLET | Freq: Two times a day (BID) | ORAL | 0 refills | Status: DC

## 2019-08-16 NOTE — Telephone Encounter (Signed)
Received fax from [facility] Pended Rx and sent to Dr. Gupta for approval.  

## 2019-08-27 DIAGNOSIS — R2681 Unsteadiness on feet: Secondary | ICD-10-CM | POA: Diagnosis not present

## 2019-08-27 DIAGNOSIS — M6281 Muscle weakness (generalized): Secondary | ICD-10-CM | POA: Diagnosis not present

## 2019-08-27 DIAGNOSIS — M542 Cervicalgia: Secondary | ICD-10-CM | POA: Diagnosis not present

## 2019-08-27 DIAGNOSIS — R260 Ataxic gait: Secondary | ICD-10-CM | POA: Diagnosis not present

## 2019-08-27 DIAGNOSIS — M545 Low back pain: Secondary | ICD-10-CM | POA: Diagnosis not present

## 2019-08-27 DIAGNOSIS — R1312 Dysphagia, oropharyngeal phase: Secondary | ICD-10-CM | POA: Diagnosis not present

## 2019-08-28 DIAGNOSIS — M545 Low back pain: Secondary | ICD-10-CM | POA: Diagnosis not present

## 2019-08-28 DIAGNOSIS — R260 Ataxic gait: Secondary | ICD-10-CM | POA: Diagnosis not present

## 2019-08-28 DIAGNOSIS — M6281 Muscle weakness (generalized): Secondary | ICD-10-CM | POA: Diagnosis not present

## 2019-08-28 DIAGNOSIS — M542 Cervicalgia: Secondary | ICD-10-CM | POA: Diagnosis not present

## 2019-08-28 DIAGNOSIS — R2681 Unsteadiness on feet: Secondary | ICD-10-CM | POA: Diagnosis not present

## 2019-08-28 DIAGNOSIS — R1312 Dysphagia, oropharyngeal phase: Secondary | ICD-10-CM | POA: Diagnosis not present

## 2019-08-29 DIAGNOSIS — R260 Ataxic gait: Secondary | ICD-10-CM | POA: Diagnosis not present

## 2019-08-29 DIAGNOSIS — R1312 Dysphagia, oropharyngeal phase: Secondary | ICD-10-CM | POA: Diagnosis not present

## 2019-08-29 DIAGNOSIS — M6281 Muscle weakness (generalized): Secondary | ICD-10-CM | POA: Diagnosis not present

## 2019-08-29 DIAGNOSIS — R2681 Unsteadiness on feet: Secondary | ICD-10-CM | POA: Diagnosis not present

## 2019-08-29 DIAGNOSIS — M545 Low back pain: Secondary | ICD-10-CM | POA: Diagnosis not present

## 2019-08-29 DIAGNOSIS — M542 Cervicalgia: Secondary | ICD-10-CM | POA: Diagnosis not present

## 2019-08-30 DIAGNOSIS — M6281 Muscle weakness (generalized): Secondary | ICD-10-CM | POA: Diagnosis not present

## 2019-08-30 DIAGNOSIS — R2681 Unsteadiness on feet: Secondary | ICD-10-CM | POA: Diagnosis not present

## 2019-08-30 DIAGNOSIS — R260 Ataxic gait: Secondary | ICD-10-CM | POA: Diagnosis not present

## 2019-08-30 DIAGNOSIS — R1312 Dysphagia, oropharyngeal phase: Secondary | ICD-10-CM | POA: Diagnosis not present

## 2019-08-30 DIAGNOSIS — M545 Low back pain: Secondary | ICD-10-CM | POA: Diagnosis not present

## 2019-08-30 DIAGNOSIS — M542 Cervicalgia: Secondary | ICD-10-CM | POA: Diagnosis not present

## 2019-08-31 ENCOUNTER — Encounter: Payer: Self-pay | Admitting: Nurse Practitioner

## 2019-08-31 ENCOUNTER — Non-Acute Institutional Stay: Payer: Medicare Other | Admitting: Nurse Practitioner

## 2019-08-31 DIAGNOSIS — J383 Other diseases of vocal cords: Secondary | ICD-10-CM | POA: Diagnosis not present

## 2019-08-31 DIAGNOSIS — K5900 Constipation, unspecified: Secondary | ICD-10-CM | POA: Diagnosis not present

## 2019-08-31 DIAGNOSIS — R634 Abnormal weight loss: Secondary | ICD-10-CM

## 2019-08-31 DIAGNOSIS — K121 Other forms of stomatitis: Secondary | ICD-10-CM

## 2019-08-31 DIAGNOSIS — M159 Polyosteoarthritis, unspecified: Secondary | ICD-10-CM

## 2019-08-31 DIAGNOSIS — M545 Low back pain: Secondary | ICD-10-CM | POA: Diagnosis not present

## 2019-08-31 DIAGNOSIS — I4891 Unspecified atrial fibrillation: Secondary | ICD-10-CM | POA: Diagnosis not present

## 2019-08-31 DIAGNOSIS — M6281 Muscle weakness (generalized): Secondary | ICD-10-CM | POA: Diagnosis not present

## 2019-08-31 DIAGNOSIS — G629 Polyneuropathy, unspecified: Secondary | ICD-10-CM

## 2019-08-31 DIAGNOSIS — R2681 Unsteadiness on feet: Secondary | ICD-10-CM | POA: Diagnosis not present

## 2019-08-31 DIAGNOSIS — R260 Ataxic gait: Secondary | ICD-10-CM | POA: Diagnosis not present

## 2019-08-31 DIAGNOSIS — M542 Cervicalgia: Secondary | ICD-10-CM | POA: Diagnosis not present

## 2019-08-31 DIAGNOSIS — R1312 Dysphagia, oropharyngeal phase: Secondary | ICD-10-CM | POA: Diagnosis not present

## 2019-08-31 NOTE — Assessment & Plan Note (Signed)
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 Chronic dry mouth. Will apply Magic mouth wash paint lips, corner of mouth, tongue, linings of oral cavity bid, adding Vit B2 25mg  qd, observe.

## 2019-08-31 NOTE — Assessment & Plan Note (Signed)
chronic, ENT  

## 2019-08-31 NOTE — Assessment & Plan Note (Signed)
Heart rate is in control, continue Atenolol.  

## 2019-08-31 NOTE — Assessment & Plan Note (Signed)
Stable, continue  Lyrica 25mg  bid since 06/01/19

## 2019-08-31 NOTE — Progress Notes (Signed)
Location:   [place] Room Number: 1 Place of Service:  [phone]) Provider:  Marlana Latus, NP  [name], MD  Patient Care Team: [name], MD as PCP - General (Internal Medicine) Angelia Mould, MD as Attending Physician (Vascular Surgery) [name], MD (Inactive) as Attending Physician (Obstetrics and Gynecology) [name], MD (Orthopedic Surgery) [name], MD (Ophthalmology) [name], MD as Consulting Physician (Urology) [name], MD as Consulting Physician (Cardiology) [name], PA-C as Physician Assistant (Otolaryngology) [name], MD as Consulting Physician (Otolaryngology) [name], MD as Consulting Physician (Dermatology) [name], MD as Consulting Physician (Physical Medicine and Rehabilitation) [name], MD as Attending Physician (Gastroenterology) Kerith Sherley X, NP as Nurse Practitioner (Internal Medicine) Kathrynn Ducking, MD as Consulting Physician (Neurology) Debara Pickett Nadean Corwin, MD as Consulting Physician (Cardiology) Druscilla Brownie, MD as Consulting Physician (Dermatology) [name], [name], NP as Nurse Practitioner (Family Medicine)  Extended Emergency Contact Information Primary Emergency Contact: [name] of [place] Phone: [phone] Mobile Phone: [phone] Relation: Son Secondary Emergency Contact: [name] Mobile Phone: [phone] Relation: Friend  Code Status:  Full code Goals of care: Advanced Directive information Advanced Directives 08/31/2019  Does Patient Have a Medical Advance Directive? Yes  Type of Advance Directive [place]  Does patient want to make changes to medical advance directive? No - Patient declined  Copy of [place] in Chart? Yes - validated most recent copy scanned in chart (See row information)  Would patient like information on creating a medical advance directive?  -     Chief Complaint  Patient presents with  . Medical Management of Chronic Issues    Routine follow up visit  . Best Practice Recommendations    Flu vaccine    HPI:  Pt is a 86 y.o. female seen today for medical management of chronic diseases.    Weight loss about #5Ibs in the past 2 months, about #8Ibs in the past 3 months since the esophageal food impaction event, the patient is cautious about eating, ST is working with the patient.   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   Peripheral neuropathy, decreased Lyrica to 26m bid 06/01/19             Dysphagia, s/p dilatation food impaction, f/u GI, takes Famotidine 233mqd             Afib, takes Atenolol 12.70m58mid             Constipation, takes IB Gard, MiraLax prn, Senokot S II qd             Dysphonia, chronic, ENT             OA, takes Tylenol 500m60md, prn/bid Tramadol.    Past Medical History:  Diagnosis Date  . Abdominal bloating   . Atrial fibrillation (HCC)[place]. Bruises easily   . Carotid artery occlusion    LEFT  . Cricopharyngeal dysphagia   . Dizziness   . DOE (dyspnea on exertion) 04/02/2014  . Fall at home Sept 2013, Dec. 2013  Jun 08, 2012  . GERD (gastroesophageal reflux disease) 02/11/2015  . Headache(784.0)   . Hoarseness   . Hypercholesterolemia   . Hyperglycemia 04/02/2014   Glucose 178 mg percent on 01/22/2014 04/05/14 Hgb A1c 6.6 Diet controlled.    . Hypertensive retinopathy  OU  . Hypothyroidism 04/15/2015  . Neuropathy    PERIPHERAL  . Pruritus   . Scoliosis   . Varicose veins   . Zenker's diverticulum    Past Surgical History:  Procedure Laterality Date  . ABDOMINAL HYSTERECTOMY  1954  . CATARACT EXTRACTION Bilateral   . CHOLECYSTECTOMY  1997  . ESOPHAGOGASTRODUODENOSCOPY N/A 03/30/2019   Procedure: ESOPHAGOGASTRODUODENOSCOPY (EGD);  Surgeon: Jerene Bears, MD;  Location: [facility];  Service: Gastroenterology;  Laterality: N/A;  . ESOPHAGOGASTRODUODENOSCOPY (EGD) WITH PROPOFOL N/A 11/25/2016   Procedure: ESOPHAGOGASTRODUODENOSCOPY (EGD) WITH PROPOFOL;  Surgeon: [name], MD;  Location: [facility]COPY;  Service: Endoscopy;  Laterality: N/A;  . EYE SURGERY Bilateral    Cat Sx  . SPINE SURGERY  1997   correct scoliosis    No Known Allergies  Allergies as of 08/31/2019   No Known Allergies     Medication List       Accurate as of August 31, 2019 11:13 AM. If you have any questions, ask your nurse or doctor.        STOP taking these medications   mupirocin ointment 2 % Commonly known as: BACTROBAN Stopped by: Brittnie Lewey X Demarious Kapur, NP     TAKE these medications   acetaminophen 500 MG tablet Commonly known as: TYLENOL Take 500 mg by mouth 2 (two) times daily.   acetaminophen 325 MG tablet Commonly known as: TYLENOL Take 650 mg by mouth every 8 (eight) hours as needed.   aspirin 81 MG chewable tablet Chew 81 mg by mouth daily.   atenolol 25 MG tablet Commonly known as: TENORMIN Take 12.5 mg by mouth 2 (two) times daily. Hold if SBP < 100 or HR < 60   Biofreeze Roll-On 4 % Gel Generic drug: Menthol (Topical Analgesic) Apply 1 application topically every 8 (eight) hours. Apply to LLE   Biotin 5000 MCG Tabs Take 5,000 mcg by mouth daily.   famotidine 20 MG tablet Commonly known as: PEPCID Take 20 mg by mouth daily.   fluticasone 50 MCG/ACT nasal spray Commonly known as: FLONASE Place 2 sprays into both nostrils daily.   hydrogen peroxide 1.5 % Soln Use 15 ml orally, swish and spit as needed for dry mouth.   IBgard 90 MG Cpcr Generic drug: Peppermint Oil Take 2 capsules by mouth 2 (two) times daily.   ipratropium 0.06 % nasal spray Commonly known as: ATROVENT Place 2 sprays into both nostrils 2 (two) times daily as needed for rhinitis.   lactose free nutrition Liqd Take 237 mLs by mouth 2 (two) times daily with a meal. Per request  of resident, states can't eat if takes it later   lidocaine-prilocaine cream Commonly known as: EMLA Apply 1 application topically daily as needed.   loratadine 10 MG tablet Commonly known as: CLARITIN Take 10 mg by mouth daily as needed for allergies.   ondansetron 4 MG tablet Commonly known as: ZOFRAN Take 4 mg by mouth every 6 (six) hours as needed for nausea or vomiting.   polyethylene glycol 17 g packet Commonly known as: MIRALAX / GLYCOLAX Take 17 g daily as needed by mouth (for constipation).   pregabalin 25 MG capsule Commonly known as: Lyrica Take one capsule by mouth twice daily   SalivaMAX Pack Use as directed 1 Package in the mouth or throat. mix with 49m's of H20; swish and spit BID; may keep in bathroom; may self administer.   sennosides-docusate sodium 8.6-50 MG tablet Commonly known as: SENOKOT-S Take 2  tablets by mouth daily.   sodium phosphate 7-19 GM/118ML Enem Place 1 enema rectally as needed for severe constipation. Give Fleet enema per rectum X 1 after removing hard stool or if soft stool is present in rectum.   Systane Balance 0.6 % Soln Generic drug: Propylene Glycol Give 1 drop to both eyes once daily as needed for dryness   traMADol 50 MG tablet Commonly known as: ULTRAM Take 50 mg by mouth daily as needed.   traMADol 50 MG tablet Commonly known as: ULTRAM Take 1 tablet (50 mg total) by mouth 2 (two) times daily.   triamcinolone cream 0.1 % Commonly known as: KENALOG Apply 1 application topically as needed. Apply to itchy areas as needed. May keep in room and self apply   [facility] by mouth. Take 1 in the morning.   VIACTIV PO Take 1 tablet by mouth. Once a morning.       Review of Systems  Constitutional: Positive for unexpected weight change. Negative for fatigue and fever.       Weight loss  HENT: Positive for hearing loss, mouth sores, sore throat and trouble swallowing. Negative for congestion and  voice change.        Sore, redness lips, corner of mouth, tongue, throat, linings of oral cavity.   Eyes: Positive for visual disturbance.  Respiratory: Negative for cough and shortness of breath.   Cardiovascular: Negative for leg swelling.  Gastrointestinal: Negative for abdominal pain, constipation, nausea and vomiting.  Genitourinary: Negative for difficulty urinating, dysuria and urgency.  Musculoskeletal: Positive for arthralgias, back pain and gait problem.  Skin: Negative for color change and pallor.  Neurological: Negative for speech difficulty, weakness and headaches.  Psychiatric/Behavioral: Negative for behavioral problems and sleep disturbance. The patient is not nervous/anxious.     Immunization History  Administered Date(s) Administered  . Influenza Split 10/20/2011, 11/03/2012  . Influenza, High Dose Seasonal PF 10/24/2018  . Influenza,inj,Quad PF,6+ Mos 09/29/2012, 11/09/2013  . Influenza-Unspecified 10/17/2014, 10/23/2015, 10/20/2016, 10/17/2017  . Moderna SARS-COVID-2 Vaccination 01/15/2019, 02/12/2019  . PPD Test 08/21/2013, 03/04/2014  . Pneumococcal Conjugate-13 08/21/2013  . Pneumococcal Polysaccharide-23 09/08/2016  . Tdap 05/20/2008  . Tetanus 11/02/2018  . Zoster 11/01/2012   Pertinent  Health Maintenance Due  Topic Date Due  . INFLUENZA VACCINE  08/12/2019  . DEXA SCAN  Completed  . PNA vac Low Risk Adult  Completed   Fall Risk  11/04/2017 09/05/2017 09/22/2016 08/25/2016 05/31/2016  Falls in the past year? Yes Yes Yes Yes No  Number falls in past yr: 2 or more 2 or more 1 2 or more -  Comment - - - - -  Injury with Fall? Yes Yes Yes Yes -  Comment - - fx 3 ribs - -  Risk Factor Category  - - - - -  Risk for fall due to : - - Impaired mobility;Impaired balance/gait - -  Risk for fall due to: Comment - - - - -   Functional Status Survey:    Vitals:   08/31/19 0947  BP: (!) 157/86  Pulse: 80  Resp: 20  Temp: 97.7 F (36.5 C)  SpO2: 96%    Weight: 90 lb 12.8 oz (41.2 kg)  Height: '5\' 2"'  (1.575 m)   Body mass index is 16.61 kg/m. Physical Exam Vitals and nursing note reviewed.  Constitutional:      Appearance: Normal appearance.  HENT:     Head: Normocephalic and atraumatic.     Nose:  Nose normal. No congestion or rhinorrhea.     Mouth/Throat:     Mouth: Mucous membranes are moist.     Comments: Sore, redness lips, corner of mouth, tongue, throat, linings of oral cavity.   Eyes:     General: Vision grossly intact. Gaze aligned appropriately. No allergic shiner or visual field deficit.       Right eye: No foreign body or hordeolum.        Left eye: No foreign body or hordeolum.     Extraocular Movements: Extraocular movements intact.     Conjunctiva/sclera: Conjunctivae normal.     Right eye: Right conjunctiva is not injected. No exudate or hemorrhage.    Left eye: Left conjunctiva is not injected. No exudate or hemorrhage.    Pupils: Pupils are equal, round, and reactive to light.  Cardiovascular:     Rate and Rhythm: Normal rate. Rhythm irregular.     Heart sounds: No murmur heard.   Pulmonary:     Effort: Pulmonary effort is normal.     Breath sounds: No rales.  Abdominal:     General: Bowel sounds are normal.     Palpations: Abdomen is soft.     Tenderness: There is no abdominal tenderness.  Musculoskeletal:     Cervical back: Normal range of motion and neck supple.     Right lower leg: No edema.     Left lower leg: No edema.  Skin:    General: Skin is warm and dry.  Neurological:     General: No focal deficit present.     Mental Status: She is alert. Mental status is at baseline.     Gait: Gait abnormal.     Comments: Oriented to person, place.   Psychiatric:        Mood and Affect: Mood normal.        Behavior: Behavior normal.        Thought Content: Thought content normal.        Judgment: Judgment normal.     Labs reviewed: Recent Labs    12/28/18 0000 05/17/19 0000  NA 139 139  K  4.6 4.3  CL 105 102  CO2 29* 33*  BUN 17 14  CREATININE 0.8 0.9  CALCIUM 9.0 9.1   Recent Labs    12/28/18 0000 05/17/19 0000  AST 23 22  ALT 13 11  ALKPHOS 68 66  ALBUMIN 3.5 3.5   Recent Labs    12/28/18 0000 05/17/19 0000  WBC 6.2 6.2  NEUTROABS 3,398 3,317  HGB 12.5 13.7  HCT 36 40  PLT 202 196   Lab Results  Component Value Date   TSH 3.16 02/08/2019   Lab Results  Component Value Date   HGBA1C 5.8 12/28/2018   Lab Results  Component Value Date   CHOL 187 11/03/2017   HDL 84 (A) 11/03/2017   LDLCALC 80 11/03/2017   TRIG 136 11/03/2017   CHOLHDL 2.1 07/29/2016    Significant Diagnostic Results in last 30 days:  No results found.  Assessment/Plan Stomatitis Stomatitis, chronic, sore red tongue, lips, throat, chapped and fissured lips-cheilosis, inflammation of the corners of mouth-angular stomatitis. Chronic dry mouth. Will apply Magic mouth wash paint lips, corner of mouth, tongue, linings of oral cavity bid, adding Vit B2 37m qd, observe.   Weight loss Weight loss about #5Ibs in the past 2 months, about #8Ibs in the past 3 months since the esophageal food impaction event, the patient is cautious about eating, ST  is working with the patient.  Will update CBC/diff, CMP/eGFR, TSH May consider Mirtazapine 7.12m qhs   Neuropathy Stable, continue  Lyrica 263mbid since 06/01/19  Atrial fibrillation with RVR (HCC) Heart rate is in control, continue Atenolol.   Constipation Stable, continue  IB Gard, MiraLax prn, Senokot S II qd   Atrophy of vocal cord chronic, ENT   Osteoarthritis  Tylenol 50062mid, prn/bid Tramadol.        Family/ staff Communication: plan of care reviewed with the patient and charge nurse.   Labs/tests ordered:  CBC/diff, CMP/eGFR, TSH  Time spend 40 minutes.

## 2019-08-31 NOTE — Assessment & Plan Note (Signed)
Tylenol 500mg  bid, prn/bid Tramadol.

## 2019-08-31 NOTE — Assessment & Plan Note (Signed)
Weight loss about #5Ibs in the past 2 months, about #8Ibs in the past 3 months since the esophageal food impaction event, the patient is cautious about eating, ST is working with the patient.  Will update CBC/diff, CMP/eGFR, TSH May consider Mirtazapine 7.56m qhs

## 2019-08-31 NOTE — Assessment & Plan Note (Signed)
Stable, continue  IB Gard, MiraLax prn, Senokot S II qd

## 2019-09-03 ENCOUNTER — Other Ambulatory Visit: Payer: Self-pay | Admitting: *Deleted

## 2019-09-03 DIAGNOSIS — M545 Low back pain: Secondary | ICD-10-CM | POA: Diagnosis not present

## 2019-09-03 DIAGNOSIS — R2681 Unsteadiness on feet: Secondary | ICD-10-CM | POA: Diagnosis not present

## 2019-09-03 DIAGNOSIS — I5022 Chronic systolic (congestive) heart failure: Secondary | ICD-10-CM | POA: Diagnosis not present

## 2019-09-03 DIAGNOSIS — R1312 Dysphagia, oropharyngeal phase: Secondary | ICD-10-CM | POA: Diagnosis not present

## 2019-09-03 DIAGNOSIS — I482 Chronic atrial fibrillation, unspecified: Secondary | ICD-10-CM | POA: Diagnosis not present

## 2019-09-03 DIAGNOSIS — I240 Acute coronary thrombosis not resulting in myocardial infarction: Secondary | ICD-10-CM | POA: Diagnosis not present

## 2019-09-03 DIAGNOSIS — M542 Cervicalgia: Secondary | ICD-10-CM | POA: Diagnosis not present

## 2019-09-03 DIAGNOSIS — M6281 Muscle weakness (generalized): Secondary | ICD-10-CM | POA: Diagnosis not present

## 2019-09-03 DIAGNOSIS — R260 Ataxic gait: Secondary | ICD-10-CM | POA: Diagnosis not present

## 2019-09-03 LAB — BASIC METABOLIC PANEL
BUN: 15 (ref 4–21)
CO2: 30 — AB (ref 13–22)
Chloride: 103 (ref 99–108)
Creatinine: 0.9 (ref 0.5–1.1)
Glucose: 71
Potassium: 4.7 (ref 3.4–5.3)
Sodium: 139 (ref 137–147)

## 2019-09-03 LAB — CBC: RBC: 3.85 — AB (ref 3.87–5.11)

## 2019-09-03 LAB — CBC AND DIFFERENTIAL
HCT: 39 (ref 36–46)
Hemoglobin: 13.5 (ref 12.0–16.0)
Neutrophils Absolute: 3231
Platelets: 182 (ref 150–399)
WBC: 5.8

## 2019-09-03 LAB — TSH: TSH: 2.68 (ref 0.41–5.90)

## 2019-09-03 LAB — COMPREHENSIVE METABOLIC PANEL
Albumin: 3.3 — AB (ref 3.5–5.0)
Calcium: 9.1 (ref 8.7–10.7)
Globulin: 2.4

## 2019-09-03 LAB — HEPATIC FUNCTION PANEL
ALT: 11 (ref 7–35)
AST: 20 (ref 13–35)
Alkaline Phosphatase: 62 (ref 25–125)
Bilirubin, Total: 0.4

## 2019-09-03 MED ORDER — PREGABALIN 25 MG PO CAPS: ORAL_CAPSULE | ORAL | 0 refills | Status: DC

## 2019-09-03 NOTE — Telephone Encounter (Signed)
Received refill From [facility] and sent to Dr. Lyndel Safe for approval.

## 2019-09-04 DIAGNOSIS — M542 Cervicalgia: Secondary | ICD-10-CM | POA: Diagnosis not present

## 2019-09-04 DIAGNOSIS — M6281 Muscle weakness (generalized): Secondary | ICD-10-CM | POA: Diagnosis not present

## 2019-09-04 DIAGNOSIS — R1312 Dysphagia, oropharyngeal phase: Secondary | ICD-10-CM | POA: Diagnosis not present

## 2019-09-04 DIAGNOSIS — R2681 Unsteadiness on feet: Secondary | ICD-10-CM | POA: Diagnosis not present

## 2019-09-04 DIAGNOSIS — M545 Low back pain: Secondary | ICD-10-CM | POA: Diagnosis not present

## 2019-09-04 DIAGNOSIS — R260 Ataxic gait: Secondary | ICD-10-CM | POA: Diagnosis not present

## 2019-09-05 DIAGNOSIS — R1312 Dysphagia, oropharyngeal phase: Secondary | ICD-10-CM | POA: Diagnosis not present

## 2019-09-05 DIAGNOSIS — R260 Ataxic gait: Secondary | ICD-10-CM | POA: Diagnosis not present

## 2019-09-05 DIAGNOSIS — M545 Low back pain: Secondary | ICD-10-CM | POA: Diagnosis not present

## 2019-09-05 DIAGNOSIS — M542 Cervicalgia: Secondary | ICD-10-CM | POA: Diagnosis not present

## 2019-09-05 DIAGNOSIS — R2681 Unsteadiness on feet: Secondary | ICD-10-CM | POA: Diagnosis not present

## 2019-09-05 DIAGNOSIS — M6281 Muscle weakness (generalized): Secondary | ICD-10-CM | POA: Diagnosis not present

## 2019-09-06 DIAGNOSIS — M6281 Muscle weakness (generalized): Secondary | ICD-10-CM | POA: Diagnosis not present

## 2019-09-06 DIAGNOSIS — M545 Low back pain: Secondary | ICD-10-CM | POA: Diagnosis not present

## 2019-09-06 DIAGNOSIS — R260 Ataxic gait: Secondary | ICD-10-CM | POA: Diagnosis not present

## 2019-09-06 DIAGNOSIS — M542 Cervicalgia: Secondary | ICD-10-CM | POA: Diagnosis not present

## 2019-09-06 DIAGNOSIS — R2681 Unsteadiness on feet: Secondary | ICD-10-CM | POA: Diagnosis not present

## 2019-09-06 DIAGNOSIS — R1312 Dysphagia, oropharyngeal phase: Secondary | ICD-10-CM | POA: Diagnosis not present

## 2019-09-07 DIAGNOSIS — M542 Cervicalgia: Secondary | ICD-10-CM | POA: Diagnosis not present

## 2019-09-07 DIAGNOSIS — R260 Ataxic gait: Secondary | ICD-10-CM | POA: Diagnosis not present

## 2019-09-07 DIAGNOSIS — M545 Low back pain: Secondary | ICD-10-CM | POA: Diagnosis not present

## 2019-09-07 DIAGNOSIS — R1312 Dysphagia, oropharyngeal phase: Secondary | ICD-10-CM | POA: Diagnosis not present

## 2019-09-07 DIAGNOSIS — R2681 Unsteadiness on feet: Secondary | ICD-10-CM | POA: Diagnosis not present

## 2019-09-07 DIAGNOSIS — M6281 Muscle weakness (generalized): Secondary | ICD-10-CM | POA: Diagnosis not present

## 2019-09-10 DIAGNOSIS — M545 Low back pain: Secondary | ICD-10-CM | POA: Diagnosis not present

## 2019-09-10 DIAGNOSIS — R260 Ataxic gait: Secondary | ICD-10-CM | POA: Diagnosis not present

## 2019-09-10 DIAGNOSIS — M542 Cervicalgia: Secondary | ICD-10-CM | POA: Diagnosis not present

## 2019-09-10 DIAGNOSIS — R2681 Unsteadiness on feet: Secondary | ICD-10-CM | POA: Diagnosis not present

## 2019-09-10 DIAGNOSIS — M6281 Muscle weakness (generalized): Secondary | ICD-10-CM | POA: Diagnosis not present

## 2019-09-10 DIAGNOSIS — R1312 Dysphagia, oropharyngeal phase: Secondary | ICD-10-CM | POA: Diagnosis not present

## 2019-09-11 DIAGNOSIS — M6281 Muscle weakness (generalized): Secondary | ICD-10-CM | POA: Diagnosis not present

## 2019-09-11 DIAGNOSIS — R1312 Dysphagia, oropharyngeal phase: Secondary | ICD-10-CM | POA: Diagnosis not present

## 2019-09-11 DIAGNOSIS — R260 Ataxic gait: Secondary | ICD-10-CM | POA: Diagnosis not present

## 2019-09-11 DIAGNOSIS — M545 Low back pain: Secondary | ICD-10-CM | POA: Diagnosis not present

## 2019-09-11 DIAGNOSIS — R2681 Unsteadiness on feet: Secondary | ICD-10-CM | POA: Diagnosis not present

## 2019-09-11 DIAGNOSIS — M542 Cervicalgia: Secondary | ICD-10-CM | POA: Diagnosis not present

## 2019-09-12 DIAGNOSIS — R4789 Other speech disturbances: Secondary | ICD-10-CM | POA: Diagnosis not present

## 2019-09-12 DIAGNOSIS — M545 Low back pain: Secondary | ICD-10-CM | POA: Diagnosis not present

## 2019-09-12 DIAGNOSIS — M6281 Muscle weakness (generalized): Secondary | ICD-10-CM | POA: Diagnosis not present

## 2019-09-12 DIAGNOSIS — R260 Ataxic gait: Secondary | ICD-10-CM | POA: Diagnosis not present

## 2019-09-12 DIAGNOSIS — R2681 Unsteadiness on feet: Secondary | ICD-10-CM | POA: Diagnosis not present

## 2019-09-12 DIAGNOSIS — R1312 Dysphagia, oropharyngeal phase: Secondary | ICD-10-CM | POA: Diagnosis not present

## 2019-09-13 DIAGNOSIS — M545 Low back pain: Secondary | ICD-10-CM | POA: Diagnosis not present

## 2019-09-13 DIAGNOSIS — R1312 Dysphagia, oropharyngeal phase: Secondary | ICD-10-CM | POA: Diagnosis not present

## 2019-09-13 DIAGNOSIS — R2681 Unsteadiness on feet: Secondary | ICD-10-CM | POA: Diagnosis not present

## 2019-09-13 DIAGNOSIS — R4789 Other speech disturbances: Secondary | ICD-10-CM | POA: Diagnosis not present

## 2019-09-13 DIAGNOSIS — M6281 Muscle weakness (generalized): Secondary | ICD-10-CM | POA: Diagnosis not present

## 2019-09-13 DIAGNOSIS — R260 Ataxic gait: Secondary | ICD-10-CM | POA: Diagnosis not present

## 2019-09-14 DIAGNOSIS — R4789 Other speech disturbances: Secondary | ICD-10-CM | POA: Diagnosis not present

## 2019-09-14 DIAGNOSIS — R1312 Dysphagia, oropharyngeal phase: Secondary | ICD-10-CM | POA: Diagnosis not present

## 2019-09-14 DIAGNOSIS — M6281 Muscle weakness (generalized): Secondary | ICD-10-CM | POA: Diagnosis not present

## 2019-09-14 DIAGNOSIS — R260 Ataxic gait: Secondary | ICD-10-CM | POA: Diagnosis not present

## 2019-09-14 DIAGNOSIS — M545 Low back pain: Secondary | ICD-10-CM | POA: Diagnosis not present

## 2019-09-14 DIAGNOSIS — R2681 Unsteadiness on feet: Secondary | ICD-10-CM | POA: Diagnosis not present

## 2019-09-17 DIAGNOSIS — R4789 Other speech disturbances: Secondary | ICD-10-CM | POA: Diagnosis not present

## 2019-09-17 DIAGNOSIS — M545 Low back pain: Secondary | ICD-10-CM | POA: Diagnosis not present

## 2019-09-17 DIAGNOSIS — R1312 Dysphagia, oropharyngeal phase: Secondary | ICD-10-CM | POA: Diagnosis not present

## 2019-09-17 DIAGNOSIS — R2681 Unsteadiness on feet: Secondary | ICD-10-CM | POA: Diagnosis not present

## 2019-09-17 DIAGNOSIS — M6281 Muscle weakness (generalized): Secondary | ICD-10-CM | POA: Diagnosis not present

## 2019-09-17 DIAGNOSIS — R260 Ataxic gait: Secondary | ICD-10-CM | POA: Diagnosis not present

## 2019-09-18 ENCOUNTER — Other Ambulatory Visit: Payer: Self-pay

## 2019-09-18 DIAGNOSIS — R1312 Dysphagia, oropharyngeal phase: Secondary | ICD-10-CM | POA: Diagnosis not present

## 2019-09-18 DIAGNOSIS — M545 Low back pain: Secondary | ICD-10-CM | POA: Diagnosis not present

## 2019-09-18 DIAGNOSIS — R260 Ataxic gait: Secondary | ICD-10-CM | POA: Diagnosis not present

## 2019-09-18 DIAGNOSIS — R2681 Unsteadiness on feet: Secondary | ICD-10-CM | POA: Diagnosis not present

## 2019-09-18 DIAGNOSIS — M6281 Muscle weakness (generalized): Secondary | ICD-10-CM | POA: Diagnosis not present

## 2019-09-18 DIAGNOSIS — R4789 Other speech disturbances: Secondary | ICD-10-CM | POA: Diagnosis not present

## 2019-09-18 MED ORDER — TRAMADOL HCL 50 MG PO TABS: 50.0000 mg | ORAL_TABLET | Freq: Two times a day (BID) | ORAL | 0 refills | Status: DC

## 2019-09-20 DIAGNOSIS — R2681 Unsteadiness on feet: Secondary | ICD-10-CM | POA: Diagnosis not present

## 2019-09-20 DIAGNOSIS — R4789 Other speech disturbances: Secondary | ICD-10-CM | POA: Diagnosis not present

## 2019-09-20 DIAGNOSIS — M6281 Muscle weakness (generalized): Secondary | ICD-10-CM | POA: Diagnosis not present

## 2019-09-20 DIAGNOSIS — R1312 Dysphagia, oropharyngeal phase: Secondary | ICD-10-CM | POA: Diagnosis not present

## 2019-09-20 DIAGNOSIS — M545 Low back pain: Secondary | ICD-10-CM | POA: Diagnosis not present

## 2019-09-20 DIAGNOSIS — R260 Ataxic gait: Secondary | ICD-10-CM | POA: Diagnosis not present

## 2019-09-21 DIAGNOSIS — R2681 Unsteadiness on feet: Secondary | ICD-10-CM | POA: Diagnosis not present

## 2019-09-21 DIAGNOSIS — M6281 Muscle weakness (generalized): Secondary | ICD-10-CM | POA: Diagnosis not present

## 2019-09-21 DIAGNOSIS — R1312 Dysphagia, oropharyngeal phase: Secondary | ICD-10-CM | POA: Diagnosis not present

## 2019-09-21 DIAGNOSIS — M545 Low back pain: Secondary | ICD-10-CM | POA: Diagnosis not present

## 2019-09-21 DIAGNOSIS — R4789 Other speech disturbances: Secondary | ICD-10-CM | POA: Diagnosis not present

## 2019-09-21 DIAGNOSIS — R260 Ataxic gait: Secondary | ICD-10-CM | POA: Diagnosis not present

## 2019-09-24 DIAGNOSIS — M545 Low back pain: Secondary | ICD-10-CM | POA: Diagnosis not present

## 2019-09-24 DIAGNOSIS — R260 Ataxic gait: Secondary | ICD-10-CM | POA: Diagnosis not present

## 2019-09-24 DIAGNOSIS — R2681 Unsteadiness on feet: Secondary | ICD-10-CM | POA: Diagnosis not present

## 2019-09-24 DIAGNOSIS — M6281 Muscle weakness (generalized): Secondary | ICD-10-CM | POA: Diagnosis not present

## 2019-09-24 DIAGNOSIS — R4789 Other speech disturbances: Secondary | ICD-10-CM | POA: Diagnosis not present

## 2019-09-24 DIAGNOSIS — R1312 Dysphagia, oropharyngeal phase: Secondary | ICD-10-CM | POA: Diagnosis not present

## 2019-09-25 DIAGNOSIS — R1312 Dysphagia, oropharyngeal phase: Secondary | ICD-10-CM | POA: Diagnosis not present

## 2019-09-25 DIAGNOSIS — R260 Ataxic gait: Secondary | ICD-10-CM | POA: Diagnosis not present

## 2019-09-25 DIAGNOSIS — R2681 Unsteadiness on feet: Secondary | ICD-10-CM | POA: Diagnosis not present

## 2019-09-25 DIAGNOSIS — R4789 Other speech disturbances: Secondary | ICD-10-CM | POA: Diagnosis not present

## 2019-09-25 DIAGNOSIS — M545 Low back pain: Secondary | ICD-10-CM | POA: Diagnosis not present

## 2019-09-25 DIAGNOSIS — M6281 Muscle weakness (generalized): Secondary | ICD-10-CM | POA: Diagnosis not present

## 2019-09-27 DIAGNOSIS — R1312 Dysphagia, oropharyngeal phase: Secondary | ICD-10-CM | POA: Diagnosis not present

## 2019-09-27 DIAGNOSIS — M6281 Muscle weakness (generalized): Secondary | ICD-10-CM | POA: Diagnosis not present

## 2019-09-27 DIAGNOSIS — R2681 Unsteadiness on feet: Secondary | ICD-10-CM | POA: Diagnosis not present

## 2019-09-27 DIAGNOSIS — R260 Ataxic gait: Secondary | ICD-10-CM | POA: Diagnosis not present

## 2019-09-27 DIAGNOSIS — R4789 Other speech disturbances: Secondary | ICD-10-CM | POA: Diagnosis not present

## 2019-09-27 DIAGNOSIS — M545 Low back pain: Secondary | ICD-10-CM | POA: Diagnosis not present

## 2019-09-28 DIAGNOSIS — R2681 Unsteadiness on feet: Secondary | ICD-10-CM | POA: Diagnosis not present

## 2019-09-28 DIAGNOSIS — M6281 Muscle weakness (generalized): Secondary | ICD-10-CM | POA: Diagnosis not present

## 2019-09-28 DIAGNOSIS — M545 Low back pain: Secondary | ICD-10-CM | POA: Diagnosis not present

## 2019-09-28 DIAGNOSIS — R260 Ataxic gait: Secondary | ICD-10-CM | POA: Diagnosis not present

## 2019-09-28 DIAGNOSIS — R1312 Dysphagia, oropharyngeal phase: Secondary | ICD-10-CM | POA: Diagnosis not present

## 2019-09-28 DIAGNOSIS — R4789 Other speech disturbances: Secondary | ICD-10-CM | POA: Diagnosis not present

## 2019-10-01 DIAGNOSIS — R1312 Dysphagia, oropharyngeal phase: Secondary | ICD-10-CM | POA: Diagnosis not present

## 2019-10-01 DIAGNOSIS — R4789 Other speech disturbances: Secondary | ICD-10-CM | POA: Diagnosis not present

## 2019-10-01 DIAGNOSIS — R2681 Unsteadiness on feet: Secondary | ICD-10-CM | POA: Diagnosis not present

## 2019-10-01 DIAGNOSIS — M6281 Muscle weakness (generalized): Secondary | ICD-10-CM | POA: Diagnosis not present

## 2019-10-01 DIAGNOSIS — M545 Low back pain: Secondary | ICD-10-CM | POA: Diagnosis not present

## 2019-10-01 DIAGNOSIS — R260 Ataxic gait: Secondary | ICD-10-CM | POA: Diagnosis not present

## 2019-10-02 DIAGNOSIS — R2681 Unsteadiness on feet: Secondary | ICD-10-CM | POA: Diagnosis not present

## 2019-10-02 DIAGNOSIS — R1312 Dysphagia, oropharyngeal phase: Secondary | ICD-10-CM | POA: Diagnosis not present

## 2019-10-02 DIAGNOSIS — R260 Ataxic gait: Secondary | ICD-10-CM | POA: Diagnosis not present

## 2019-10-02 DIAGNOSIS — M6281 Muscle weakness (generalized): Secondary | ICD-10-CM | POA: Diagnosis not present

## 2019-10-02 DIAGNOSIS — M545 Low back pain: Secondary | ICD-10-CM | POA: Diagnosis not present

## 2019-10-02 DIAGNOSIS — R4789 Other speech disturbances: Secondary | ICD-10-CM | POA: Diagnosis not present

## 2019-10-03 ENCOUNTER — Other Ambulatory Visit: Payer: Self-pay

## 2019-10-03 MED ORDER — PREGABALIN 25 MG PO CAPS: ORAL_CAPSULE | ORAL | 0 refills | Status: DC

## 2019-10-03 NOTE — Telephone Encounter (Signed)
Fax received from [facility] for Pregabalin 25 mg. Pended and sent to Dr. Lyndel Safe for approval

## 2019-10-04 DIAGNOSIS — R260 Ataxic gait: Secondary | ICD-10-CM | POA: Diagnosis not present

## 2019-10-04 DIAGNOSIS — R4789 Other speech disturbances: Secondary | ICD-10-CM | POA: Diagnosis not present

## 2019-10-04 DIAGNOSIS — R2681 Unsteadiness on feet: Secondary | ICD-10-CM | POA: Diagnosis not present

## 2019-10-04 DIAGNOSIS — M6281 Muscle weakness (generalized): Secondary | ICD-10-CM | POA: Diagnosis not present

## 2019-10-04 DIAGNOSIS — M545 Low back pain: Secondary | ICD-10-CM | POA: Diagnosis not present

## 2019-10-04 DIAGNOSIS — R1312 Dysphagia, oropharyngeal phase: Secondary | ICD-10-CM | POA: Diagnosis not present

## 2019-10-05 DIAGNOSIS — R2681 Unsteadiness on feet: Secondary | ICD-10-CM | POA: Diagnosis not present

## 2019-10-05 DIAGNOSIS — M6281 Muscle weakness (generalized): Secondary | ICD-10-CM | POA: Diagnosis not present

## 2019-10-05 DIAGNOSIS — R260 Ataxic gait: Secondary | ICD-10-CM | POA: Diagnosis not present

## 2019-10-05 DIAGNOSIS — M545 Low back pain: Secondary | ICD-10-CM | POA: Diagnosis not present

## 2019-10-05 DIAGNOSIS — R4789 Other speech disturbances: Secondary | ICD-10-CM | POA: Diagnosis not present

## 2019-10-05 DIAGNOSIS — R1312 Dysphagia, oropharyngeal phase: Secondary | ICD-10-CM | POA: Diagnosis not present

## 2019-10-08 DIAGNOSIS — M545 Low back pain: Secondary | ICD-10-CM | POA: Diagnosis not present

## 2019-10-08 DIAGNOSIS — M6281 Muscle weakness (generalized): Secondary | ICD-10-CM | POA: Diagnosis not present

## 2019-10-08 DIAGNOSIS — R1312 Dysphagia, oropharyngeal phase: Secondary | ICD-10-CM | POA: Diagnosis not present

## 2019-10-08 DIAGNOSIS — R4789 Other speech disturbances: Secondary | ICD-10-CM | POA: Diagnosis not present

## 2019-10-08 DIAGNOSIS — R260 Ataxic gait: Secondary | ICD-10-CM | POA: Diagnosis not present

## 2019-10-08 DIAGNOSIS — R2681 Unsteadiness on feet: Secondary | ICD-10-CM | POA: Diagnosis not present

## 2019-10-09 DIAGNOSIS — R4789 Other speech disturbances: Secondary | ICD-10-CM | POA: Diagnosis not present

## 2019-10-09 DIAGNOSIS — M545 Low back pain: Secondary | ICD-10-CM | POA: Diagnosis not present

## 2019-10-09 DIAGNOSIS — R2681 Unsteadiness on feet: Secondary | ICD-10-CM | POA: Diagnosis not present

## 2019-10-09 DIAGNOSIS — M6281 Muscle weakness (generalized): Secondary | ICD-10-CM | POA: Diagnosis not present

## 2019-10-09 DIAGNOSIS — R260 Ataxic gait: Secondary | ICD-10-CM | POA: Diagnosis not present

## 2019-10-09 DIAGNOSIS — R1312 Dysphagia, oropharyngeal phase: Secondary | ICD-10-CM | POA: Diagnosis not present

## 2019-10-10 DIAGNOSIS — R260 Ataxic gait: Secondary | ICD-10-CM | POA: Diagnosis not present

## 2019-10-10 DIAGNOSIS — M6281 Muscle weakness (generalized): Secondary | ICD-10-CM | POA: Diagnosis not present

## 2019-10-10 DIAGNOSIS — R1312 Dysphagia, oropharyngeal phase: Secondary | ICD-10-CM | POA: Diagnosis not present

## 2019-10-10 DIAGNOSIS — M545 Low back pain: Secondary | ICD-10-CM | POA: Diagnosis not present

## 2019-10-10 DIAGNOSIS — R4789 Other speech disturbances: Secondary | ICD-10-CM | POA: Diagnosis not present

## 2019-10-10 DIAGNOSIS — R2681 Unsteadiness on feet: Secondary | ICD-10-CM | POA: Diagnosis not present

## 2019-10-11 DIAGNOSIS — R1312 Dysphagia, oropharyngeal phase: Secondary | ICD-10-CM | POA: Diagnosis not present

## 2019-10-11 DIAGNOSIS — R260 Ataxic gait: Secondary | ICD-10-CM | POA: Diagnosis not present

## 2019-10-11 DIAGNOSIS — M545 Low back pain: Secondary | ICD-10-CM | POA: Diagnosis not present

## 2019-10-11 DIAGNOSIS — R4789 Other speech disturbances: Secondary | ICD-10-CM | POA: Diagnosis not present

## 2019-10-11 DIAGNOSIS — M6281 Muscle weakness (generalized): Secondary | ICD-10-CM | POA: Diagnosis not present

## 2019-10-11 DIAGNOSIS — R2681 Unsteadiness on feet: Secondary | ICD-10-CM | POA: Diagnosis not present

## 2019-10-15 DIAGNOSIS — R2681 Unsteadiness on feet: Secondary | ICD-10-CM | POA: Diagnosis not present

## 2019-10-15 DIAGNOSIS — M545 Low back pain, unspecified: Secondary | ICD-10-CM | POA: Diagnosis not present

## 2019-10-15 DIAGNOSIS — M6281 Muscle weakness (generalized): Secondary | ICD-10-CM | POA: Diagnosis not present

## 2019-10-15 DIAGNOSIS — R1312 Dysphagia, oropharyngeal phase: Secondary | ICD-10-CM | POA: Diagnosis not present

## 2019-10-15 DIAGNOSIS — R260 Ataxic gait: Secondary | ICD-10-CM | POA: Diagnosis not present

## 2019-10-15 DIAGNOSIS — R4789 Other speech disturbances: Secondary | ICD-10-CM | POA: Diagnosis not present

## 2019-10-16 DIAGNOSIS — R260 Ataxic gait: Secondary | ICD-10-CM | POA: Diagnosis not present

## 2019-10-16 DIAGNOSIS — R1312 Dysphagia, oropharyngeal phase: Secondary | ICD-10-CM | POA: Diagnosis not present

## 2019-10-16 DIAGNOSIS — M6281 Muscle weakness (generalized): Secondary | ICD-10-CM | POA: Diagnosis not present

## 2019-10-16 DIAGNOSIS — R2681 Unsteadiness on feet: Secondary | ICD-10-CM | POA: Diagnosis not present

## 2019-10-16 DIAGNOSIS — M545 Low back pain, unspecified: Secondary | ICD-10-CM | POA: Diagnosis not present

## 2019-10-16 DIAGNOSIS — R4789 Other speech disturbances: Secondary | ICD-10-CM | POA: Diagnosis not present

## 2019-10-17 DIAGNOSIS — M545 Low back pain, unspecified: Secondary | ICD-10-CM | POA: Diagnosis not present

## 2019-10-17 DIAGNOSIS — R4789 Other speech disturbances: Secondary | ICD-10-CM | POA: Diagnosis not present

## 2019-10-17 DIAGNOSIS — R2681 Unsteadiness on feet: Secondary | ICD-10-CM | POA: Diagnosis not present

## 2019-10-17 DIAGNOSIS — R1312 Dysphagia, oropharyngeal phase: Secondary | ICD-10-CM | POA: Diagnosis not present

## 2019-10-17 DIAGNOSIS — R260 Ataxic gait: Secondary | ICD-10-CM | POA: Diagnosis not present

## 2019-10-17 DIAGNOSIS — M6281 Muscle weakness (generalized): Secondary | ICD-10-CM | POA: Diagnosis not present

## 2019-10-18 DIAGNOSIS — M545 Low back pain, unspecified: Secondary | ICD-10-CM | POA: Diagnosis not present

## 2019-10-18 DIAGNOSIS — R2681 Unsteadiness on feet: Secondary | ICD-10-CM | POA: Diagnosis not present

## 2019-10-18 DIAGNOSIS — R260 Ataxic gait: Secondary | ICD-10-CM | POA: Diagnosis not present

## 2019-10-18 DIAGNOSIS — R4789 Other speech disturbances: Secondary | ICD-10-CM | POA: Diagnosis not present

## 2019-10-18 DIAGNOSIS — M6281 Muscle weakness (generalized): Secondary | ICD-10-CM | POA: Diagnosis not present

## 2019-10-18 DIAGNOSIS — R1312 Dysphagia, oropharyngeal phase: Secondary | ICD-10-CM | POA: Diagnosis not present

## 2019-10-22 DIAGNOSIS — R4789 Other speech disturbances: Secondary | ICD-10-CM | POA: Diagnosis not present

## 2019-10-22 DIAGNOSIS — M6281 Muscle weakness (generalized): Secondary | ICD-10-CM | POA: Diagnosis not present

## 2019-10-22 DIAGNOSIS — R1312 Dysphagia, oropharyngeal phase: Secondary | ICD-10-CM | POA: Diagnosis not present

## 2019-10-22 DIAGNOSIS — R260 Ataxic gait: Secondary | ICD-10-CM | POA: Diagnosis not present

## 2019-10-22 DIAGNOSIS — R2681 Unsteadiness on feet: Secondary | ICD-10-CM | POA: Diagnosis not present

## 2019-10-22 DIAGNOSIS — M545 Low back pain, unspecified: Secondary | ICD-10-CM | POA: Diagnosis not present

## 2019-10-23 DIAGNOSIS — R2681 Unsteadiness on feet: Secondary | ICD-10-CM | POA: Diagnosis not present

## 2019-10-23 DIAGNOSIS — R1312 Dysphagia, oropharyngeal phase: Secondary | ICD-10-CM | POA: Diagnosis not present

## 2019-10-23 DIAGNOSIS — R260 Ataxic gait: Secondary | ICD-10-CM | POA: Diagnosis not present

## 2019-10-23 DIAGNOSIS — M545 Low back pain, unspecified: Secondary | ICD-10-CM | POA: Diagnosis not present

## 2019-10-23 DIAGNOSIS — R4789 Other speech disturbances: Secondary | ICD-10-CM | POA: Diagnosis not present

## 2019-10-23 DIAGNOSIS — M6281 Muscle weakness (generalized): Secondary | ICD-10-CM | POA: Diagnosis not present

## 2019-10-25 DIAGNOSIS — M545 Low back pain, unspecified: Secondary | ICD-10-CM | POA: Diagnosis not present

## 2019-10-25 DIAGNOSIS — R260 Ataxic gait: Secondary | ICD-10-CM | POA: Diagnosis not present

## 2019-10-25 DIAGNOSIS — R1312 Dysphagia, oropharyngeal phase: Secondary | ICD-10-CM | POA: Diagnosis not present

## 2019-10-25 DIAGNOSIS — R2681 Unsteadiness on feet: Secondary | ICD-10-CM | POA: Diagnosis not present

## 2019-10-25 DIAGNOSIS — M6281 Muscle weakness (generalized): Secondary | ICD-10-CM | POA: Diagnosis not present

## 2019-10-25 DIAGNOSIS — R4789 Other speech disturbances: Secondary | ICD-10-CM | POA: Diagnosis not present

## 2019-10-28 DIAGNOSIS — R260 Ataxic gait: Secondary | ICD-10-CM | POA: Diagnosis not present

## 2019-10-28 DIAGNOSIS — M6281 Muscle weakness (generalized): Secondary | ICD-10-CM | POA: Diagnosis not present

## 2019-10-28 DIAGNOSIS — R2681 Unsteadiness on feet: Secondary | ICD-10-CM | POA: Diagnosis not present

## 2019-10-28 DIAGNOSIS — R4789 Other speech disturbances: Secondary | ICD-10-CM | POA: Diagnosis not present

## 2019-10-28 DIAGNOSIS — R1312 Dysphagia, oropharyngeal phase: Secondary | ICD-10-CM | POA: Diagnosis not present

## 2019-10-28 DIAGNOSIS — M545 Low back pain, unspecified: Secondary | ICD-10-CM | POA: Diagnosis not present

## 2019-10-29 DIAGNOSIS — R4789 Other speech disturbances: Secondary | ICD-10-CM | POA: Diagnosis not present

## 2019-10-29 DIAGNOSIS — R2681 Unsteadiness on feet: Secondary | ICD-10-CM | POA: Diagnosis not present

## 2019-10-29 DIAGNOSIS — R260 Ataxic gait: Secondary | ICD-10-CM | POA: Diagnosis not present

## 2019-10-29 DIAGNOSIS — M545 Low back pain, unspecified: Secondary | ICD-10-CM | POA: Diagnosis not present

## 2019-10-29 DIAGNOSIS — R1312 Dysphagia, oropharyngeal phase: Secondary | ICD-10-CM | POA: Diagnosis not present

## 2019-10-29 DIAGNOSIS — M6281 Muscle weakness (generalized): Secondary | ICD-10-CM | POA: Diagnosis not present

## 2019-10-31 DIAGNOSIS — M545 Low back pain, unspecified: Secondary | ICD-10-CM | POA: Diagnosis not present

## 2019-10-31 DIAGNOSIS — R2681 Unsteadiness on feet: Secondary | ICD-10-CM | POA: Diagnosis not present

## 2019-10-31 DIAGNOSIS — R4789 Other speech disturbances: Secondary | ICD-10-CM | POA: Diagnosis not present

## 2019-10-31 DIAGNOSIS — R260 Ataxic gait: Secondary | ICD-10-CM | POA: Diagnosis not present

## 2019-10-31 DIAGNOSIS — R1312 Dysphagia, oropharyngeal phase: Secondary | ICD-10-CM | POA: Diagnosis not present

## 2019-10-31 DIAGNOSIS — M6281 Muscle weakness (generalized): Secondary | ICD-10-CM | POA: Diagnosis not present

## 2019-11-02 ENCOUNTER — Other Ambulatory Visit: Payer: Self-pay | Admitting: *Deleted

## 2019-11-02 NOTE — Telephone Encounter (Signed)
[facility] sent refill Request Pended Rx and sent to Dr. Lyndel Safe for approval.

## 2019-11-03 MED ORDER — PREGABALIN 25 MG PO CAPS: ORAL_CAPSULE | ORAL | 0 refills | Status: DC

## 2019-11-05 DIAGNOSIS — M6281 Muscle weakness (generalized): Secondary | ICD-10-CM | POA: Diagnosis not present

## 2019-11-05 DIAGNOSIS — R1312 Dysphagia, oropharyngeal phase: Secondary | ICD-10-CM | POA: Diagnosis not present

## 2019-11-05 DIAGNOSIS — R4789 Other speech disturbances: Secondary | ICD-10-CM | POA: Diagnosis not present

## 2019-11-05 DIAGNOSIS — R2681 Unsteadiness on feet: Secondary | ICD-10-CM | POA: Diagnosis not present

## 2019-11-05 DIAGNOSIS — R260 Ataxic gait: Secondary | ICD-10-CM | POA: Diagnosis not present

## 2019-11-05 DIAGNOSIS — M545 Low back pain, unspecified: Secondary | ICD-10-CM | POA: Diagnosis not present

## 2019-11-06 DIAGNOSIS — M545 Low back pain, unspecified: Secondary | ICD-10-CM | POA: Diagnosis not present

## 2019-11-06 DIAGNOSIS — R4789 Other speech disturbances: Secondary | ICD-10-CM | POA: Diagnosis not present

## 2019-11-06 DIAGNOSIS — R1312 Dysphagia, oropharyngeal phase: Secondary | ICD-10-CM | POA: Diagnosis not present

## 2019-11-06 DIAGNOSIS — M6281 Muscle weakness (generalized): Secondary | ICD-10-CM | POA: Diagnosis not present

## 2019-11-06 DIAGNOSIS — R2681 Unsteadiness on feet: Secondary | ICD-10-CM | POA: Diagnosis not present

## 2019-11-06 DIAGNOSIS — R260 Ataxic gait: Secondary | ICD-10-CM | POA: Diagnosis not present

## 2019-11-07 DIAGNOSIS — M545 Low back pain, unspecified: Secondary | ICD-10-CM | POA: Diagnosis not present

## 2019-11-07 DIAGNOSIS — R1312 Dysphagia, oropharyngeal phase: Secondary | ICD-10-CM | POA: Diagnosis not present

## 2019-11-07 DIAGNOSIS — M6281 Muscle weakness (generalized): Secondary | ICD-10-CM | POA: Diagnosis not present

## 2019-11-07 DIAGNOSIS — R2681 Unsteadiness on feet: Secondary | ICD-10-CM | POA: Diagnosis not present

## 2019-11-07 DIAGNOSIS — R4789 Other speech disturbances: Secondary | ICD-10-CM | POA: Diagnosis not present

## 2019-11-07 DIAGNOSIS — R260 Ataxic gait: Secondary | ICD-10-CM | POA: Diagnosis not present

## 2019-11-09 DIAGNOSIS — R4789 Other speech disturbances: Secondary | ICD-10-CM | POA: Diagnosis not present

## 2019-11-09 DIAGNOSIS — R1312 Dysphagia, oropharyngeal phase: Secondary | ICD-10-CM | POA: Diagnosis not present

## 2019-11-09 DIAGNOSIS — R2681 Unsteadiness on feet: Secondary | ICD-10-CM | POA: Diagnosis not present

## 2019-11-09 DIAGNOSIS — R260 Ataxic gait: Secondary | ICD-10-CM | POA: Diagnosis not present

## 2019-11-09 DIAGNOSIS — M545 Low back pain, unspecified: Secondary | ICD-10-CM | POA: Diagnosis not present

## 2019-11-09 DIAGNOSIS — M6281 Muscle weakness (generalized): Secondary | ICD-10-CM | POA: Diagnosis not present

## 2019-11-15 DIAGNOSIS — M545 Low back pain, unspecified: Secondary | ICD-10-CM | POA: Diagnosis not present

## 2019-11-15 DIAGNOSIS — M6281 Muscle weakness (generalized): Secondary | ICD-10-CM | POA: Diagnosis not present

## 2019-11-15 DIAGNOSIS — R260 Ataxic gait: Secondary | ICD-10-CM | POA: Diagnosis not present

## 2019-11-15 DIAGNOSIS — R2681 Unsteadiness on feet: Secondary | ICD-10-CM | POA: Diagnosis not present

## 2019-11-16 DIAGNOSIS — M6281 Muscle weakness (generalized): Secondary | ICD-10-CM | POA: Diagnosis not present

## 2019-11-16 DIAGNOSIS — R2681 Unsteadiness on feet: Secondary | ICD-10-CM | POA: Diagnosis not present

## 2019-11-16 DIAGNOSIS — R260 Ataxic gait: Secondary | ICD-10-CM | POA: Diagnosis not present

## 2019-11-16 DIAGNOSIS — M545 Low back pain, unspecified: Secondary | ICD-10-CM | POA: Diagnosis not present

## 2019-11-21 DIAGNOSIS — R260 Ataxic gait: Secondary | ICD-10-CM | POA: Diagnosis not present

## 2019-11-21 DIAGNOSIS — R2681 Unsteadiness on feet: Secondary | ICD-10-CM | POA: Diagnosis not present

## 2019-11-21 DIAGNOSIS — M6281 Muscle weakness (generalized): Secondary | ICD-10-CM | POA: Diagnosis not present

## 2019-11-21 DIAGNOSIS — M545 Low back pain, unspecified: Secondary | ICD-10-CM | POA: Diagnosis not present

## 2019-11-22 ENCOUNTER — Non-Acute Institutional Stay: Payer: Medicare Other | Admitting: Internal Medicine

## 2019-11-22 ENCOUNTER — Encounter: Payer: Self-pay | Admitting: Internal Medicine

## 2019-11-22 DIAGNOSIS — R4189 Other symptoms and signs involving cognitive functions and awareness: Secondary | ICD-10-CM

## 2019-11-22 DIAGNOSIS — R1310 Dysphagia, unspecified: Secondary | ICD-10-CM | POA: Diagnosis not present

## 2019-11-22 DIAGNOSIS — R634 Abnormal weight loss: Secondary | ICD-10-CM

## 2019-11-22 DIAGNOSIS — R4689 Other symptoms and signs involving appearance and behavior: Secondary | ICD-10-CM | POA: Diagnosis not present

## 2019-11-22 DIAGNOSIS — I4891 Unspecified atrial fibrillation: Secondary | ICD-10-CM

## 2019-11-22 DIAGNOSIS — M159 Polyosteoarthritis, unspecified: Secondary | ICD-10-CM | POA: Diagnosis not present

## 2019-11-22 DIAGNOSIS — G629 Polyneuropathy, unspecified: Secondary | ICD-10-CM

## 2019-11-22 NOTE — Progress Notes (Signed)
Location:   [facility] Room Number: 1 Place of Service:  [phone]) Provider:  Veleta Miners, MD    Patient Care Team: [name], MD as PCP - General (Internal Medicine) Angelia Mould, MD as Attending Physician (Vascular Surgery) [name], MD (Inactive) as Attending Physician (Obstetrics and Gynecology) [name], MD (Orthopedic Surgery) [name], MD (Ophthalmology) [name], MD as Consulting Physician (Urology) [name], MD as Consulting Physician (Cardiology) [name], PA-C as Physician Assistant (Otolaryngology) [name], MD as Consulting Physician (Otolaryngology) [name], MD as Consulting Physician (Dermatology) [name], MD as Consulting Physician (Physical Medicine and Rehabilitation) [name], MD as Attending Physician (Gastroenterology) Mast, Man X, NP as Nurse Practitioner (Internal Medicine) Kathrynn Ducking, MD as Consulting Physician (Neurology) Debara Pickett Nadean Corwin, MD as Consulting Physician (Cardiology) Druscilla Brownie, MD as Consulting Physician (Dermatology) [name], [name], NP as Nurse Practitioner (Family Medicine)  Extended Emergency Contact Information Primary Emergency Contact: [name] of [place] Phone: [phone] Mobile Phone: [phone] Relation: Son Secondary Emergency Contact: [name] Mobile Phone: [phone] Relation: Friend  Code Status: FULL CODE  Goals of care: Advanced Directive information Advanced Directives 11/22/2019  Does Patient Have a Medical Advance Directive? Yes  Type of Paramedic of [place];Living will  Does patient want to make changes to medical advance directive? No - Patient declined  Copy of [place] in Chart? Yes - validated most recent copy scanned in chart (See row information)  Would patient like information on creating a medical advance directive? -      Chief Complaint  Patient presents with  . Medical Management of Chronic Issues    Routine visit     HPI:  Pt is a 86 y.o. female seen today for medical management of chronic diseases.    Patient has h/ochronic atrial fibrillation not on any coagulation,  GERD, hypothyroidism, hyperlipidemia, neuropathy,,  cognitive impairment, H/o Syncope, h/o IBS and Esophageal Stenosiss/p Dilatation.  She also Has H/o Retinal Vein Occlusion with Edema S/P Vitreal Injections  Doing well in [place]. Staying independent No falls Her only issue is weight loss Has lost 10 lbs since my last visit D/W patient she says her appetite is poor but is working with Dietary for better options and supplement   Past Medical History:  Diagnosis Date  . Abdominal bloating   . Atrial fibrillation ([place])   . Bruises easily   . Carotid artery occlusion    LEFT  . Cricopharyngeal dysphagia   . Dizziness   . DOE (dyspnea on exertion) 04/02/2014  . Fall at home Sept 2013, Dec. 2013  Jun 08, 2012  . GERD (gastroesophageal reflux disease) 02/11/2015  . Headache(784.0)   . Hoarseness   . Hypercholesterolemia   . Hyperglycemia 04/02/2014   Glucose 178 mg percent on 01/22/2014 04/05/14 Hgb A1c 6.6 Diet controlled.    . Hypertensive retinopathy    OU  . Hypothyroidism 04/15/2015  . Neuropathy    PERIPHERAL  . Pruritus   . Scoliosis   . Varicose veins   . Zenker's diverticulum    Past Surgical History:  Procedure Laterality Date  . ABDOMINAL HYSTERECTOMY  1954  . CATARACT EXTRACTION Bilateral   . CHOLECYSTECTOMY  1997  . ESOPHAGOGASTRODUODENOSCOPY N/A 03/30/2019   Procedure: ESOPHAGOGASTRODUODENOSCOPY (EGD);  Surgeon: Jerene Bears, MD;  Location: [facility];  Service: Gastroenterology;  Laterality: N/A;  . ESOPHAGOGASTRODUODENOSCOPY (EGD) WITH PROPOFOL N/A 11/25/2016   Procedure:  ESOPHAGOGASTRODUODENOSCOPY (EGD) WITH PROPOFOL;  Surgeon: [name], MD;  Location: [facility];  Service: Endoscopy;   Laterality: N/A;  . EYE SURGERY Bilateral    Cat Sx  . SPINE SURGERY  1997   correct scoliosis    No Known Allergies  Allergies as of 11/22/2019   No Known Allergies     Medication List       Accurate as of November 22, 2019  4:26 PM. If you have any questions, ask your nurse or doctor.        acetaminophen 500 MG tablet Commonly known as: TYLENOL Take 500 mg by mouth 2 (two) times daily.   acetaminophen 325 MG tablet Commonly known as: TYLENOL Take 650 mg by mouth every 8 (eight) hours as needed.   aspirin 81 MG chewable tablet Chew 81 mg by mouth daily.   atenolol 25 MG tablet Commonly known as: TENORMIN Take 12.5 mg by mouth 2 (two) times daily. Hold if SBP < 100 or HR < 60   benzocaine-menthol 6-10 MG lozenge Commonly known as: CHLORAEPTIC Take 1 lozenge by mouth every 2 (two) hours as needed for sore throat.   Biofreeze Roll-On 4 % Gel Generic drug: Menthol (Topical Analgesic) Apply 1 application topically every 8 (eight) hours. Apply to LLE   Biotin 5000 MCG Tabs Take 5,000 mcg by mouth daily.   famotidine 20 MG tablet Commonly known as: PEPCID Take 20 mg by mouth daily.   fluticasone 50 MCG/ACT nasal spray Commonly known as: FLONASE Place 2 sprays into both nostrils daily.   hydrogen peroxide 1.5 % Soln Use 15 ml orally, swish and spit as needed for dry mouth.   IBgard 90 MG Cpcr Generic drug: Peppermint Oil Take 2 capsules by mouth 2 (two) times daily.   ipratropium 0.06 % nasal spray Commonly known as: ATROVENT Place 2 sprays into both nostrils 2 (two) times daily as needed for rhinitis.   lactose free nutrition Liqd Take 237 mLs by mouth 2 (two) times daily with a meal. Per request of resident, states can't eat if takes it later   lidocaine-prilocaine cream Commonly known as: EMLA Apply 1 application topically daily as needed.   loratadine 10 MG tablet Commonly known as: CLARITIN Take 10 mg by mouth daily as needed for allergies.     ondansetron 4 MG tablet Commonly known as: ZOFRAN Take 4 mg by mouth every 6 (six) hours as needed for nausea or vomiting.   polyethylene glycol 17 g packet Commonly known as: MIRALAX / GLYCOLAX Take 17 g daily as needed by mouth (for constipation).   pregabalin 25 MG capsule Commonly known as: LYRICA Take 25 mg by mouth in the morning. What changed: Another medication with the same name was removed. Continue taking this medication, and follow the directions you see here. Changed by: [name], MD   pregabalin 50 MG capsule Commonly known as: LYRICA Take 50 mg by mouth at bedtime. What changed: Another medication with the same name was removed. Continue taking this medication, and follow the directions you see here. Changed by: [name], MD   SalivaMAX Pack Use as directed 1 Package in the mouth or throat. mix with 21mL's of H20; swish and spit BID; may keep in bathroom; may self administer.   sennosides-docusate sodium 8.6-50 MG tablet Commonly known as: SENOKOT-S Take 2 tablets by mouth daily.   sodium phosphate 7-19 GM/118ML Enem Place 1 enema rectally as needed for severe constipation. Give Fleet enema per  rectum X 1 after removing hard stool or if soft stool is present in rectum.   Systane Balance 0.6 % Soln Generic drug: Propylene Glycol Give 1 drop to both eyes once daily as needed for dryness   traMADol 50 MG tablet Commonly known as: ULTRAM Take 50 mg by mouth daily as needed.   traMADol 50 MG tablet Commonly known as: ULTRAM Take 1 tablet (50 mg total) by mouth 2 (two) times daily.   triamcinolone cream 0.1 % Commonly known as: KENALOG Apply 1 application topically as needed. Apply to itchy areas as needed. May keep in room and self apply   [facility] by mouth. Take 1 in the morning.   VIACTIV PO Take 1 tablet by mouth. Once a morning.   Vitamin B-2 25 MG Tabs Take 1 tablet by mouth daily.       Review of Systems   Review of Systems  Constitutional: Negative for activity change, appetite change, chills, diaphoresis, fatigue and fever.  HENT: Negative for mouth sores, postnasal drip, rhinorrhea, sinus pain and sore throat.   Respiratory: Negative for apnea, cough, chest tightness, shortness of breath and wheezing.   Cardiovascular: Negative for chest pain, palpitations and leg swelling.  Gastrointestinal: Negative for abdominal distention, abdominal pain, constipation, diarrhea, nausea and vomiting.  Genitourinary: Negative for dysuria and frequency.  Musculoskeletal: Negative for arthralgias, joint swelling and myalgias.  Skin: Negative for rash.  Neurological: Negative for dizziness, syncope, weakness, light-headedness and numbness.  Psychiatric/Behavioral: Negative for behavioral problems, confusion and sleep disturbance.     Immunization History  Administered Date(s) Administered  . Influenza Split 10/20/2011, 11/03/2012  . Influenza, High Dose Seasonal PF 10/24/2018  . Influenza,inj,Quad PF,6+ Mos 09/29/2012, 11/09/2013  . Influenza-Unspecified 10/17/2014, 10/23/2015, 10/20/2016, 10/17/2017, 10/16/2019  . Moderna SARS-COVID-2 Vaccination 01/15/2019, 02/12/2019  . PPD Test 08/21/2013, 03/04/2014  . Pneumococcal Conjugate-13 08/21/2013  . Pneumococcal Polysaccharide-23 09/08/2016  . Tdap 05/20/2008  . Tetanus 11/02/2018  . Zoster 11/01/2012   Pertinent  Health Maintenance Due  Topic Date Due  . INFLUENZA VACCINE  Completed  . DEXA SCAN  Completed  . PNA vac Low Risk Adult  Completed   Fall Risk  11/04/2017 09/05/2017 09/22/2016 08/25/2016 05/31/2016  Falls in the past year? Yes Yes Yes Yes No  Number falls in past yr: 2 or more 2 or more 1 2 or more -  Comment - - - - -  Injury with Fall? Yes Yes Yes Yes -  Comment - - fx 3 ribs - -  Risk Factor Category  - - - - -  Risk for fall due to : - - Impaired mobility;Impaired balance/gait - -  Risk for fall due to: Comment - - - - -    Functional Status Survey:    Vitals:   11/22/19 1609  BP: (!) 144/74  Pulse: 84  Resp: 16  Temp: (!) 97 F (36.1 C)  SpO2: 100%  Weight: 88 lb 6.4 oz (40.1 kg)  Height: 5\' 2"  (1.575 m)   Body mass index is 16.17 kg/m. Physical Exam  Constitutional: Oriented to person, place, and time. Very Frail  HENT:  Head: Normocephalic.  Mouth/Throat: Oropharynx is clear and moist.  Eyes: Pupils are equal, round, and reactive to light.  Neck: Neck supple.  Cardiovascular: Normal rate and normal heart sounds.  No murmur heard. Pulmonary/Chest: Effort normal and breath sounds normal. No respiratory distress. No wheezes. She has no rales.  Abdominal: Soft. Bowel sounds are normal.  No distension. There is no tenderness. There is no rebound.  Musculoskeletal: No edema.  Lymphadenopathy: none Neurological: Alert and oriented to person, place, and time.  Skin: Skin is warm and dry.  Psychiatric: Normal mood and affect. Behavior is normal. Thought content normal.    Labs reviewed: Recent Labs    12/28/18 0000 05/17/19 0000  NA 139 139  K 4.6 4.3  CL 105 102  CO2 29* 33*  BUN 17 14  CREATININE 0.8 0.9  CALCIUM 9.0 9.1   Recent Labs    12/28/18 0000 05/17/19 0000  AST 23 22  ALT 13 11  ALKPHOS 68 66  ALBUMIN 3.5 3.5   Recent Labs    12/28/18 0000 05/17/19 0000  WBC 6.2 6.2  NEUTROABS 3,398 3,317  HGB 12.5 13.7  HCT 36 40  PLT 202 196   Lab Results  Component Value Date   TSH 3.16 02/08/2019   Lab Results  Component Value Date   HGBA1C 5.8 12/28/2018   Lab Results  Component Value Date   CHOL 187 11/03/2017   HDL 84 (A) 11/03/2017   LDLCALC 80 11/03/2017   TRIG 136 11/03/2017   CHOLHDL 2.1 07/29/2016    Significant Diagnostic Results in last 30 days:  No results found.  Assessment/Plan  Atrial fibrillation with RVR (HCC) Stable on Lopressor No Anticoagulation due to age and fraility Weight loss Will follow Consider Remeron if continues with  weight  loss Neuropathy Low dose of Lyrica Osteoarthritis of multiple joints, unspecified osteoarthritis type On tramadol Dysphagia, unspecified type S/p Dilatation Doing well  Cognitive and behavioral changes Staying independent   Constipation Follows with GI but has been doing okay on IBgard and MiraLAX  Retinal Vein Oclusion with Macular edema Follows with ophthalmology States does not have good vision in 1 eye  Dysphonia Will follow with ENT   Family/ staff Communication:   Labs/tests ordered:

## 2019-11-23 DIAGNOSIS — M6281 Muscle weakness (generalized): Secondary | ICD-10-CM | POA: Diagnosis not present

## 2019-11-23 DIAGNOSIS — R260 Ataxic gait: Secondary | ICD-10-CM | POA: Diagnosis not present

## 2019-11-23 DIAGNOSIS — R2681 Unsteadiness on feet: Secondary | ICD-10-CM | POA: Diagnosis not present

## 2019-11-23 DIAGNOSIS — M545 Low back pain, unspecified: Secondary | ICD-10-CM | POA: Diagnosis not present

## 2019-11-26 DIAGNOSIS — Z23 Encounter for immunization: Secondary | ICD-10-CM | POA: Diagnosis not present

## 2019-11-28 DIAGNOSIS — M6281 Muscle weakness (generalized): Secondary | ICD-10-CM | POA: Diagnosis not present

## 2019-11-28 DIAGNOSIS — M545 Low back pain, unspecified: Secondary | ICD-10-CM | POA: Diagnosis not present

## 2019-11-28 DIAGNOSIS — R2681 Unsteadiness on feet: Secondary | ICD-10-CM | POA: Diagnosis not present

## 2019-11-28 DIAGNOSIS — R260 Ataxic gait: Secondary | ICD-10-CM | POA: Diagnosis not present

## 2019-11-29 DIAGNOSIS — M6281 Muscle weakness (generalized): Secondary | ICD-10-CM | POA: Diagnosis not present

## 2019-11-29 DIAGNOSIS — M545 Low back pain, unspecified: Secondary | ICD-10-CM | POA: Diagnosis not present

## 2019-11-29 DIAGNOSIS — R260 Ataxic gait: Secondary | ICD-10-CM | POA: Diagnosis not present

## 2019-11-29 DIAGNOSIS — R2681 Unsteadiness on feet: Secondary | ICD-10-CM | POA: Diagnosis not present

## 2019-12-03 ENCOUNTER — Encounter: Payer: Self-pay | Admitting: Nurse Practitioner

## 2019-12-03 ENCOUNTER — Non-Acute Institutional Stay: Payer: Medicare Other | Admitting: Nurse Practitioner

## 2019-12-03 DIAGNOSIS — I482 Chronic atrial fibrillation, unspecified: Secondary | ICD-10-CM | POA: Diagnosis not present

## 2019-12-03 DIAGNOSIS — M159 Polyosteoarthritis, unspecified: Secondary | ICD-10-CM | POA: Diagnosis not present

## 2019-12-03 DIAGNOSIS — K5900 Constipation, unspecified: Secondary | ICD-10-CM | POA: Diagnosis not present

## 2019-12-03 DIAGNOSIS — R32 Unspecified urinary incontinence: Secondary | ICD-10-CM

## 2019-12-03 DIAGNOSIS — R490 Dysphonia: Secondary | ICD-10-CM | POA: Diagnosis not present

## 2019-12-03 DIAGNOSIS — I5022 Chronic systolic (congestive) heart failure: Secondary | ICD-10-CM | POA: Diagnosis not present

## 2019-12-03 DIAGNOSIS — G629 Polyneuropathy, unspecified: Secondary | ICD-10-CM | POA: Diagnosis not present

## 2019-12-03 DIAGNOSIS — R1310 Dysphagia, unspecified: Secondary | ICD-10-CM | POA: Diagnosis not present

## 2019-12-03 DIAGNOSIS — I4891 Unspecified atrial fibrillation: Secondary | ICD-10-CM

## 2019-12-03 DIAGNOSIS — R6889 Other general symptoms and signs: Secondary | ICD-10-CM | POA: Diagnosis not present

## 2019-12-03 DIAGNOSIS — Z79899 Other long term (current) drug therapy: Secondary | ICD-10-CM | POA: Diagnosis not present

## 2019-12-03 NOTE — Assessment & Plan Note (Signed)
Able to walk independently with walker, takes Lyrica.

## 2019-12-03 NOTE — Assessment & Plan Note (Signed)
OA, takes Tylenol 500mg  bid, prn/bid Tramadol.

## 2019-12-03 NOTE — Assessment & Plan Note (Addendum)
Dysphagia, s/p dilatation food impaction, f/u GI, takes Famotidine 20mg  qd

## 2019-12-03 NOTE — Assessment & Plan Note (Signed)
Afib, takes Atenolol 12.5mg  bid. ASA 81mg  qd.

## 2019-12-03 NOTE — Progress Notes (Signed)
Location:    [facility] Room Number: 1 Place of Service:  ALF ([phone]) Provider:  Marda Stalker, Lennie Odor NP  [name], MD  Patient Care Team: [name], MD as PCP - General (Internal Medicine) Angelia Mould, MD as Attending Physician (Vascular Surgery) [name], MD (Inactive) as Attending Physician (Obstetrics and Gynecology) [name], MD (Orthopedic Surgery) [name], MD (Ophthalmology) [name], MD as Consulting Physician (Urology) [name], MD as Consulting Physician (Cardiology) [name], PA-C as Physician Assistant (Otolaryngology) [name], MD as Consulting Physician (Otolaryngology) [name], MD as Consulting Physician (Dermatology) [name], MD as Consulting Physician (Physical Medicine and Rehabilitation) [name], MD as Attending Physician (Gastroenterology) Analea Muller X, NP as Nurse Practitioner (Internal Medicine) Kathrynn Ducking, MD as Consulting Physician (Neurology) Debara Pickett Nadean Corwin, MD as Consulting Physician (Cardiology) Druscilla Brownie, MD as Consulting Physician (Dermatology) [name], [name], NP as Nurse Practitioner (Family Medicine)  Extended Emergency Contact Information Primary Emergency Contact: [name] of [place] Phone: [phone] Mobile Phone: [phone] Relation: Son Secondary Emergency Contact: [name] Mobile Phone: [phone] Relation: Friend  Code Status:  Full Code Goals of care: Advanced Directive information Advanced Directives 11/22/2019  Does Patient Have a Medical Advance Directive? Yes  Type of Paramedic of [place];Living will  Does patient want to make changes to medical advance directive? No - Patient declined  Copy of [place] in Chart? Yes - validated most recent copy scanned in chart (See row information)  Would patient like information on creating a medical  advance directive? -     Chief Complaint  Patient presents with  . Acute Visit    Urinary incontinence    HPI:  Pt is a 86 y.o. female seen today for an acute visit for staff reported the patient's c/o urinary incontinence, new, the patient denied abd pain, nausea, vomiting, urinary urgency, frequency, blood in urine, or dysuria.   Peripheral neuropathy, takes Lyrica Dysphagia, s/p dilatation food impaction, f/u GI, takes Famotidine 20mg  qd Afib, takes Atenolol 12.5mg  bid. ASA 81mg  qd.  Constipation, takes IB Gard, MiraLax prn, Senokot S II qd  Dysphonia, chronic, ENT  OA, takes Tylenol 500mg  bid, prn/bid Tramadol.        Past Medical History:  Diagnosis Date  . Abdominal bloating   . Atrial fibrillation ([place])   . Bruises easily   . Carotid artery occlusion    LEFT  . Cricopharyngeal dysphagia   . Dizziness   . DOE (dyspnea on exertion) 04/02/2014  . Fall at home Sept 2013, Dec. 2013  Jun 08, 2012  . GERD (gastroesophageal reflux disease) 02/11/2015  . Headache(784.0)   . Hoarseness   . Hypercholesterolemia   . Hyperglycemia 04/02/2014   Glucose 178 mg percent on 01/22/2014 04/05/14 Hgb A1c 6.6 Diet controlled.    . Hypertensive retinopathy    OU  . Hypothyroidism 04/15/2015  . Neuropathy    PERIPHERAL  . Pruritus   . Scoliosis   . Varicose veins   . Zenker's diverticulum    Past Surgical History:  Procedure Laterality Date  . ABDOMINAL HYSTERECTOMY  1954  . CATARACT EXTRACTION Bilateral   . CHOLECYSTECTOMY  1997  . ESOPHAGOGASTRODUODENOSCOPY N/A 03/30/2019   Procedure: ESOPHAGOGASTRODUODENOSCOPY (EGD);  Surgeon: Jerene Bears, MD;  Location: [facility];  Service: Gastroenterology;  Laterality: N/A;  . ESOPHAGOGASTRODUODENOSCOPY (EGD) WITH PROPOFOL N/A 11/25/2016   Procedure: ESOPHAGOGASTRODUODENOSCOPY (EGD) WITH PROPOFOL;  Surgeon: Ardis Hughs,  Melene Plan, MD;  Location: [facility];  Service: Endoscopy;  Laterality: N/A;  . EYE  SURGERY Bilateral    Cat Sx  . SPINE SURGERY  1997   correct scoliosis    No Known Allergies  Allergies as of 12/03/2019   No Known Allergies     Medication List       Accurate as of December 03, 2019 11:59 PM. If you have any questions, ask your nurse or doctor.        acetaminophen 500 MG tablet Commonly known as: TYLENOL Take 500 mg by mouth 2 (two) times daily.   acetaminophen 325 MG tablet Commonly known as: TYLENOL Take 650 mg by mouth every 8 (eight) hours as needed.   aspirin 81 MG chewable tablet Chew 81 mg by mouth daily.   atenolol 25 MG tablet Commonly known as: TENORMIN Take 12.5 mg by mouth 2 (two) times daily. Hold if SBP < 100 or HR < 60   benzocaine-menthol 6-10 MG lozenge Commonly known as: CHLORAEPTIC Take 1 lozenge by mouth every 2 (two) hours as needed for sore throat.   Biofreeze Roll-On 4 % Gel Generic drug: Menthol (Topical Analgesic) Apply 1 application topically every 8 (eight) hours. Apply to LLE   Biotin 5000 MCG Tabs Take 5,000 mcg by mouth daily.   famotidine 20 MG tablet Commonly known as: PEPCID Take 20 mg by mouth daily.   fluticasone 50 MCG/ACT nasal spray Commonly known as: FLONASE Place 2 sprays into both nostrils daily.   hydrogen peroxide 1.5 % Soln Use 15 ml orally, swish and spit as needed for dry mouth.   IBgard 90 MG Cpcr Generic drug: Peppermint Oil Take 2 capsules by mouth 2 (two) times daily.   ipratropium 0.06 % nasal spray Commonly known as: ATROVENT Place 2 sprays into both nostrils 2 (two) times daily as needed for rhinitis.   lactose free nutrition Liqd Take 237 mLs by mouth 2 (two) times daily with a meal. Per request of resident, states can't eat if takes it later   lidocaine-prilocaine cream Commonly known as: EMLA Apply 1 application topically daily as needed.   loratadine 10 MG tablet Commonly known as: CLARITIN Take 10 mg by mouth daily as needed for allergies.   ondansetron 4 MG  tablet Commonly known as: ZOFRAN Take 4 mg by mouth every 6 (six) hours as needed for nausea or vomiting.   polyethylene glycol 17 g packet Commonly known as: MIRALAX / GLYCOLAX Take 17 g daily as needed by mouth (for constipation).   pregabalin 25 MG capsule Commonly known as: LYRICA Take 25 mg by mouth in the morning.   pregabalin 50 MG capsule Commonly known as: LYRICA Take 50 mg by mouth at bedtime.   SalivaMAX Pack Use as directed 1 Package in the mouth or throat. mix with 84mL's of H20; swish and spit BID; may keep in bathroom; may self administer.   sennosides-docusate sodium 8.6-50 MG tablet Commonly known as: SENOKOT-S Take 2 tablets by mouth daily.   sodium phosphate 7-19 GM/118ML Enem Place 1 enema rectally as needed for severe constipation. Give Fleet enema per rectum X 1 after removing hard stool or if soft stool is present in rectum.   Systane Balance 0.6 % Soln Generic drug: Propylene Glycol Give 1 drop to both eyes once daily as needed for dryness   traMADol 50 MG tablet Commonly known as: ULTRAM Take 50 mg by mouth daily as needed.   traMADol 50 MG tablet Commonly  known as: ULTRAM Take 1 tablet (50 mg total) by mouth 2 (two) times daily.   triamcinolone 0.1 % Commonly known as: KENALOG Apply 1 application topically as needed. Apply to itchy areas as needed. May keep in room and self apply   [facility] by mouth. Take 1 in the morning.   VIACTIV PO Take 1 tablet by mouth. Once a morning.   Vitamin B-2 25 MG Tabs Take 1 tablet by mouth daily.       Review of Systems  Constitutional: Negative for appetite change, fatigue and fever.  HENT: Positive for hearing loss, mouth sores, sore throat and trouble swallowing. Negative for congestion and voice change.        Sore, redness lips, corner of mouth, tongue, throat, linings of oral cavity.   Eyes: Positive for visual disturbance.  Respiratory: Negative for cough and  shortness of breath.   Cardiovascular: Negative for leg swelling.  Gastrointestinal: Negative for abdominal pain, constipation, nausea and vomiting.  Genitourinary: Negative for difficulty urinating, dysuria, hematuria and urgency.       New onset urinary incontinence.   Musculoskeletal: Positive for arthralgias, back pain and gait problem.  Skin: Negative for color change and pallor.  Neurological: Negative for speech difficulty, weakness and headaches.  Psychiatric/Behavioral: Negative for behavioral problems and sleep disturbance. The patient is not nervous/anxious.     Immunization History  Administered Date(s) Administered  . Influenza Split 10/20/2011, 11/03/2012  . Influenza, High Dose Seasonal PF 10/24/2018  . Influenza,inj,Quad PF,6+ Mos 09/29/2012, 11/09/2013  . Influenza-Unspecified 10/17/2014, 10/23/2015, 10/20/2016, 10/17/2017, 10/16/2019  . Moderna SARS-COVID-2 Vaccination 01/15/2019, 02/12/2019  . PPD Test 08/21/2013, 03/04/2014  . Pneumococcal Conjugate-13 08/21/2013  . Pneumococcal Polysaccharide-23 09/08/2016  . Tdap 05/20/2008  . Tetanus 11/02/2018  . Zoster 11/01/2012   Pertinent  Health Maintenance Due  Topic Date Due  . INFLUENZA VACCINE  Completed  . DEXA SCAN  Completed  . PNA vac Low Risk Adult  Completed   Fall Risk  11/04/2017 09/05/2017 09/22/2016 08/25/2016 05/31/2016  Falls in the past year? Yes Yes Yes Yes No  Number falls in past yr: 2 or more 2 or more 1 2 or more -  Comment - - - - -  Injury with Fall? Yes Yes Yes Yes -  Comment - - fx 3 ribs - -  Risk Factor Category  - - - - -  Risk for fall due to : - - Impaired mobility;Impaired balance/gait - -  Risk for fall due to: Comment - - - - -   Functional Status Survey:    Vitals:   12/03/19 1001  BP: 124/63  Pulse: 63  Resp: (!) 21  Temp: 98.4 F (36.9 C)  SpO2: 99%  Weight: 88 lb 6.4 oz (40.1 kg)  Height: 5\' 2"  (1.575 m)   Body mass index is 16.17 kg/m. Physical Exam Vitals and  nursing note reviewed.  Constitutional:      Appearance: Normal appearance.  HENT:     Head: Normocephalic and atraumatic.     Nose: Nose normal. No congestion or rhinorrhea.     Mouth/Throat:     Mouth: Mucous membranes are moist.     Comments: Sore, redness lips, corner of mouth, tongue, throat, linings of oral cavity.   Eyes:     General: Vision grossly intact. Gaze aligned appropriately. No allergic shiner or visual field deficit.       Right eye: No foreign body or hordeolum.  Left eye: No foreign body or hordeolum.     Extraocular Movements: Extraocular movements intact.     Conjunctiva/sclera: Conjunctivae normal.     Right eye: Right conjunctiva is not injected. No exudate or hemorrhage.    Left eye: Left conjunctiva is not injected. No exudate or hemorrhage.    Pupils: Pupils are equal, round, and reactive to light.  Cardiovascular:     Rate and Rhythm: Normal rate. Rhythm irregular.     Heart sounds: No murmur heard.   Pulmonary:     Effort: Pulmonary effort is normal.     Breath sounds: No rales.  Abdominal:     General: Bowel sounds are normal.     Palpations: Abdomen is soft.     Tenderness: There is no abdominal tenderness. There is no right CVA tenderness, left CVA tenderness, guarding or rebound.  Musculoskeletal:     Cervical back: Normal range of motion and neck supple.     Right lower leg: No edema.     Left lower leg: No edema.  Skin:    General: Skin is warm and dry.  Neurological:     General: No focal deficit present.     Mental Status: She is alert. Mental status is at baseline.     Gait: Gait abnormal.     Comments: Oriented to person, place.   Psychiatric:        Mood and Affect: Mood normal.        Behavior: Behavior normal.        Thought Content: Thought content normal.        Judgment: Judgment normal.     Labs reviewed: Recent Labs    12/28/18 0000 05/17/19 0000 09/03/19 0000  NA 139 139 139  K 4.6 4.3 4.7  CL 105 102 103   CO2 29* 33* 30*  BUN 17 14 15   CREATININE 0.8 0.9 0.9  CALCIUM 9.0 9.1 9.1   Recent Labs    12/28/18 0000 05/17/19 0000 09/03/19 0000  AST 23 22 20   ALT 13 11 11   ALKPHOS 68 66 62  ALBUMIN 3.5 3.5 3.3*   Recent Labs    12/28/18 0000 05/17/19 0000 09/03/19 0000  WBC 6.2 6.2 5.8  NEUTROABS 3,398 3,317 3,231.00  HGB 12.5 13.7 13.5  HCT 36 40 39  PLT 202 196 182   Lab Results  Component Value Date   TSH 2.68 09/03/2019   Lab Results  Component Value Date   HGBA1C 5.8 12/28/2018   Lab Results  Component Value Date   CHOL 187 11/03/2017   HDL 84 (A) 11/03/2017   LDLCALC 80 11/03/2017   TRIG 136 11/03/2017   CHOLHDL 2.1 07/29/2016    Significant Diagnostic Results in last 30 days:  No results found.  Assessment/Plan Urinary incontinence staff reported the patient's c/o urinary incontinence, new, the patient denied abd pain, nausea, vomiting, urinary urgency, frequency, blood in urine, or dysuria.  UA C/S, CBC/diff to r/o UTI  Dysphagia Dysphagia, s/p dilatation food impaction, f/u GI, takes Famotidine 20mg  qd   Neuropathy Able to walk independently with walker, takes Lyrica.    Atrial fibrillation with RVR (HCC) Afib, takes Atenolol 12.5mg  bid. ASA 81mg  qd.   Constipation Constipation, takes IB Gard, MiraLax prn, Senokot S II qd   Dysphonia Dysphonia, chronic, ENT  Osteoarthritis OA, takes Tylenol 500mg  bid, prn/bid Tramadol.       Family/ staff Communication: plan of care reviewed with the patient and charge nurse.  Labs/tests ordered:  UA C/S, CBC/diff

## 2019-12-03 NOTE — Assessment & Plan Note (Signed)
Dysphonia, chronic, ENT

## 2019-12-03 NOTE — Assessment & Plan Note (Signed)
staff reported the patient's c/o urinary incontinence, new, the patient denied abd pain, nausea, vomiting, urinary urgency, frequency, blood in urine, or dysuria.  UA C/S, CBC/diff to r/o UTI

## 2019-12-03 NOTE — Assessment & Plan Note (Signed)
Constipation, takes IB Gard, MiraLax prn, Senokot S II qd

## 2019-12-04 ENCOUNTER — Encounter: Payer: Self-pay | Admitting: Nurse Practitioner

## 2019-12-04 ENCOUNTER — Non-Acute Institutional Stay (INDEPENDENT_AMBULATORY_CARE_PROVIDER_SITE_OTHER): Payer: Medicare Other | Admitting: Nurse Practitioner

## 2019-12-04 DIAGNOSIS — R509 Fever, unspecified: Secondary | ICD-10-CM | POA: Diagnosis not present

## 2019-12-04 DIAGNOSIS — Z0000 Encounter for general adult medical examination without abnormal findings: Secondary | ICD-10-CM | POA: Diagnosis not present

## 2019-12-04 DIAGNOSIS — I482 Chronic atrial fibrillation, unspecified: Secondary | ICD-10-CM | POA: Diagnosis not present

## 2019-12-04 DIAGNOSIS — I5022 Chronic systolic (congestive) heart failure: Secondary | ICD-10-CM | POA: Diagnosis not present

## 2019-12-04 DIAGNOSIS — R4182 Altered mental status, unspecified: Secondary | ICD-10-CM | POA: Diagnosis not present

## 2019-12-04 DIAGNOSIS — R42 Dizziness and giddiness: Secondary | ICD-10-CM | POA: Diagnosis not present

## 2019-12-04 LAB — CBC AND DIFFERENTIAL
HCT: 39 (ref 36–46)
Hemoglobin: 13.5 (ref 12.0–16.0)
Neutrophils Absolute: 4012
Platelets: 153 (ref 150–399)
WBC: 7.1

## 2019-12-04 LAB — CBC: RBC: 3.92 (ref 3.87–5.11)

## 2019-12-05 ENCOUNTER — Encounter: Payer: Self-pay | Admitting: Nurse Practitioner

## 2019-12-05 NOTE — Progress Notes (Signed)
Subjective:   Vanessa [name] is a 86 y.o. female who presents for Medicare Annual (Subsequent) preventive examination [facility] [place]    Objective:    Today's Vitals   12/04/19 1104 12/05/19 1119  BP: (!) 146/60   Pulse: 74   Resp: 20   Temp: (!) 97 F (36.1 C)   SpO2: 98%   Weight: 88 lb 6.4 oz (40.1 kg)   Height: 5\' 2"  (1.575 m)   PainSc:  5    Body mass index is 16.17 kg/m.  Advanced Directives 12/04/2019 11/22/2019 11/22/2019 08/31/2019 06/01/2019 05/07/2019 03/30/2019  Does Patient Have a Medical Advance Directive? Yes Yes Yes Yes Yes Yes Yes  Type of Paramedic of [place];Living will [place];Living will [place];Living will Healthcare Power of Attorney Living will;Healthcare Power of [place]  Does patient want to make changes to medical advance directive? No - Patient declined No - Patient declined No - Patient declined No - Patient declined No - Patient declined No - Patient declined No - Patient declined  Copy of [place] in Chart? Yes - validated most recent copy scanned in chart (See row information) Yes - validated most recent copy scanned in chart (See row information) Yes - validated most recent copy scanned in chart (See row information) Yes - validated most recent copy scanned in chart (See row information) Yes - validated most recent copy scanned in chart (See row information) - Yes - validated most recent copy scanned in chart (See row information)  Would patient like information on creating a medical advance directive? - - - - - - -    Current Medications (verified) Outpatient Encounter Medications as of 12/04/2019  Medication Sig  . acetaminophen (TYLENOL) 325 MG tablet Take 650 mg by mouth every 8 (eight) hours as needed.  Marland Kitchen acetaminophen (TYLENOL) 500 MG tablet Take 500 mg by mouth 2 (two) times  daily.   . Artificial Saliva (SALIVAMAX) PACK Use as directed 1 Package in the mouth or throat. mix with 49mL's of H20; swish and spit BID; may keep in bathroom; may self administer.  Marland Kitchen aspirin 81 MG chewable tablet Chew 81 mg by mouth daily.  Marland Kitchen atenolol (TENORMIN) 25 MG tablet Take 12.5 mg by mouth 2 (two) times daily. Hold if SBP < 100 or HR < 60  . benzocaine-menthol (CHLORAEPTIC) 6-10 MG lozenge Take 1 lozenge by mouth every 2 (two) hours as needed for sore throat.  . Biotin 5000 MCG TABS Take 5,000 mcg by mouth daily.  . famotidine (PEPCID) 20 MG tablet Take 20 mg by mouth daily.  . fluticasone (FLONASE) 50 MCG/ACT nasal spray Place 2 sprays into both nostrils daily.  . hydrogen peroxide 1.5 % SOLN Use 15 ml orally, swish and spit as needed for dry mouth.  Marland Kitchen ipratropium (ATROVENT) 0.06 % nasal spray Place 2 sprays into both nostrils 2 (two) times daily as needed for rhinitis.  Prudencio Burly ([place]) [place] by mouth. Take 1 in the morning.  . lactose free nutrition (BOOST PLUS) LIQD Take 237 mLs by mouth 2 (two) times daily with a meal. Per request of resident, states can't eat if takes it later   . lidocaine-prilocaine (EMLA) cream Apply 1 application topically daily as needed.  . loratadine (CLARITIN) 10 MG tablet Take 10 mg by mouth daily as needed for allergies.  . Menthol, Topical Analgesic, (BIOFREEZE  ROLL-ON) 4 % GEL Apply 1 application topically every 8 (eight) hours. Apply to LLE  . ondansetron (ZOFRAN) 4 MG tablet Take 4 mg by mouth every 6 (six) hours as needed for nausea or vomiting.  Marland Kitchen Peppermint Oil (IBGARD) 90 MG CPCR Take 2 capsules by mouth 2 (two) times daily.   . polyethylene glycol (MIRALAX / GLYCOLAX) packet Take 17 g daily as needed by mouth (for constipation).   . pregabalin (LYRICA) 25 MG capsule Take 25 mg by mouth in the morning.  . pregabalin (LYRICA) 50 MG capsule Take 50 mg by mouth at bedtime.  Marland Kitchen Propylene Glycol (SYSTANE BALANCE)  0.6 % SOLN Give 1 drop to both eyes once daily as needed for dryness  . Riboflavin (VITAMIN B-2) 25 MG TABS Take 1 tablet by mouth daily.  . sennosides-docusate sodium (SENOKOT-S) 8.6-50 MG tablet Take 2 tablets by mouth daily.   . sodium phosphate (FLEET) 7-19 GM/118ML ENEM Place 1 enema rectally as needed for severe constipation. Give Fleet enema per rectum X 1 after removing hard stool or if soft stool is present in rectum.  . traMADol (ULTRAM) 50 MG tablet Take 50 mg by mouth daily as needed.   . traMADol (ULTRAM) 50 MG tablet Take 1 tablet (50 mg total) by mouth 2 (two) times daily.  Marland Kitchen triamcinolone cream (KENALOG) 0.1 % Apply 1 application topically as needed. Apply to itchy areas as needed. May keep in room and self apply  . [DISCONTINUED] Calcium-Vitamin D-Vitamin K (VIACTIV PO) Take 1 tablet by mouth. Once a morning.   No facility-administered encounter medications on file as of 12/04/2019.    Allergies (verified) Patient has no known allergies.   History: Past Medical History:  Diagnosis Date  . Abdominal bloating   . Atrial fibrillation ([place])   . Bruises easily   . Carotid artery occlusion    LEFT  . Cricopharyngeal dysphagia   . Dizziness   . DOE (dyspnea on exertion) 04/02/2014  . Fall at home Sept 2013, Dec. 2013  Jun 08, 2012  . GERD (gastroesophageal reflux disease) 02/11/2015  . Headache(784.0)   . Hoarseness   . Hypercholesterolemia   . Hyperglycemia 04/02/2014   Glucose 178 mg percent on 01/22/2014 04/05/14 Hgb A1c 6.6 Diet controlled.    . Hypertensive retinopathy    OU  . Hypothyroidism 04/15/2015  . Neuropathy    PERIPHERAL  . Pruritus   . Scoliosis   . Varicose veins   . Zenker's diverticulum    Past Surgical History:  Procedure Laterality Date  . ABDOMINAL HYSTERECTOMY  1954  . CATARACT EXTRACTION Bilateral   . CHOLECYSTECTOMY  1997  . ESOPHAGOGASTRODUODENOSCOPY N/A 03/30/2019   Procedure: ESOPHAGOGASTRODUODENOSCOPY (EGD);  Surgeon: Jerene Bears, MD;   Location: [facility];  Service: Gastroenterology;  Laterality: N/A;  . ESOPHAGOGASTRODUODENOSCOPY (EGD) WITH PROPOFOL N/A 11/25/2016   Procedure: ESOPHAGOGASTRODUODENOSCOPY (EGD) WITH PROPOFOL;  Surgeon: [name], MD;  Location: [facility];  Service: Endoscopy;  Laterality: N/A;  . EYE SURGERY Bilateral    Cat Sx  . SPINE SURGERY  1997   correct scoliosis   Family History  Adopted: Yes  Problem Relation Age of Onset  . Diabetes Mother   . Heart attack Mother   . Heart disease Mother        After age 79  . Heart attack Father   . Stroke Father   . Breast cancer Sister   . Heart disease Maternal Grandmother   . Cancer Son  adopted son. esophagus, stomach, liver  . Stomach cancer Neg Hx   . Colon cancer Neg Hx    Social History   Socioeconomic History  . Marital status: Widowed    Spouse name: Not on file  . Number of children: 2  . Years of education: 12+  . Highest education level: Not on file  Occupational History  . 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 Smoking status: Never Smoker  . Smokeless tobacco: Never Used  Vaping Use  . Vaping Use: Never used  Substance and Sexual Activity  . Alcohol use: No    Alcohol/week: 0.0 standard drinks  . Drug use: No  . Sexual activity: Never    Birth control/protection: None  Other Topics Concern  . Not on file  Social History Narrative   Patient lives at [facility]   Patient is a widowed.    Patient is retired.    Patient has no children but adopted twin sons.    Patient has a college degree.    Never smoked   Alcohol none   Exercise with therapy   Walks with cane   POA       Patient only drinks de-caf drinks.   Patient is right handed.   Social Determinants of Health   Financial Resource Strain:   . Difficulty of Paying Living Expenses: Not on file  Food Insecurity:   . Worried About Charity fundraiser in the Last  Year: Not on file  . Ran Out of Food in the Last Year: Not on file  Transportation Needs:   . Lack of Transportation (Medical): Not on file  . Lack of Transportation (Non-Medical): Not on file  Physical Activity:   . Days of Exercise per Week: Not on file  . Minutes of Exercise per Session: Not on file  Stress:   . Feeling of Stress : Not on file  Social Connections:   . Frequency of Communication with Friends and Family: Not on file  . Frequency of Social Gatherings with Friends and Family: Not on file  . Attends Religious Services: Not on file  . Active Member of Clubs or Organizations: Not on file  . Attends Archivist Meetings: Not on file  . Marital Status: Not on file    Tobacco Counseling Counseling given: Not Answered   Clinical Intake:  Pre-visit preparation completed: Yes  Pain : 0-10 Pain Score: 5  Pain Type: Chronic pain, Neuropathic pain Pain Location: Back Pain Orientation: Mid, Lower Pain Radiating Towards: no Pain Descriptors / Indicators: Aching, Dull, Discomfort Pain Onset: More than a month ago Pain Frequency: Several days a week Pain Relieving Factors: position, pain medication Effect of Pain on Daily Activities: no effect on current level of her ADLs  Pain Relieving Factors: position, pain medication  BMI - recorded: 16.17 Nutritional Status: BMI <19  Underweight Nutritional Risks: None Diabetes: No  How often do you need to have someone help you when you read instructions, pamphlets, or other written materials from your doctor or pharmacy?: 1 - Never What is the last grade level you completed in school?: 2 years of college  Diabetic?no  Interpreter Needed?: No  Information entered by :: Zakiyyah Savannah Bretta Bang NP   Activities of Daily Living In your present state of health, do you have any difficulty performing the following activities: 12/05/2019  Hearing? Y  Vision? N  Difficulty concentrating or making decisions? Darreld Mclean  Walking or  climbing stairs? Y  Dressing or bathing? Y  Doing errands, shopping? Y  Preparing Food and eating ? N  Using the Toilet? N  In the past six months, have you accidently leaked urine? Y  Do you have problems with loss of bowel control? Y  Managing your Medications? Y  Managing your Finances? Y  Housekeeping or managing your Housekeeping? Y  Some recent data might be hidden    Patient Care Team: [name], MD as PCP - General (Internal Medicine) Angelia Mould, MD as Attending Physician (Vascular Surgery) [name], MD (Inactive) as Attending Physician (Obstetrics and Gynecology) [name], MD (Orthopedic Surgery) [name], MD (Ophthalmology) [name], MD as Consulting Physician (Urology) [name], MD as Consulting Physician (Cardiology) [name], PA-C as Physician Assistant (Otolaryngology) [name], MD as Consulting Physician (Otolaryngology) [name], MD as Consulting Physician (Dermatology) [name], MD as Consulting Physician (Physical Medicine and Rehabilitation) [name], MD as Attending Physician (Gastroenterology) Lydie Stammen X, NP as Nurse Practitioner (Internal Medicine) Kathrynn Ducking, MD as Consulting Physician (Neurology) Debara Pickett Nadean Corwin, MD as Consulting Physician (Cardiology) Druscilla Brownie, MD as Consulting Physician (Dermatology) [name], [name], NP as Nurse Practitioner (Family Medicine)  Indicate any recent Medical Services you may have received from other than [facility] providers in the past year (date may be approximate).     Assessment:   This is a routine wellness examination for Erikah.  Hearing/Vision screen No exam data present  Dietary issues and exercise activities discussed: Current Exercise Habits: The patient does not participate in regular exercise at present, Exercise limited by: neurologic condition(s);orthopedic condition(s)  Goals    . Gain weight      Depression  Screen PHQ 2/9 Scores 09/05/2017 08/25/2016 05/31/2016 09/02/2015 04/29/2015 10/08/2014 04/02/2014  PHQ - 2 Score 0 0 0 3 0 0 0  PHQ- 9 Score - - - - - - -    Fall Risk Fall Risk  11/04/2017 09/05/2017 09/22/2016 08/25/2016 05/31/2016  Falls in the past year? Yes Yes Yes Yes No  Number falls in past yr: 2 or more 2 or more 1 2 or more -  Comment - - - - -  Injury with Fall? Yes Yes Yes Yes -  Comment - - fx 3 ribs - -  Risk Factor Category  - - - - -  Risk for fall due to : - - Impaired mobility;Impaired balance/gait - -  Risk for fall due to: Comment - - - - -    Any stairs in or around the home? Yes  If so, are there any without handrails? Yes  Home free of loose throw rugs in walkways, pet beds, electrical cords, etc? Yes  Adequate lighting in your home to reduce risk of falls? Yes   ASSISTIVE DEVICES UTILIZED TO PREVENT FALLS:  Life alert? No  Use of a cane, walker or w/c? Yes  Grab bars in the bathroom? Yes  Shower chair or bench in shower? Yes  Elevated toilet seat or a handicapped toilet? Yes   TIMED UP AND GO:  Was the test performed? Yes .  Length of time to ambulate 10 feet: 8 sec.   Gait slow and steady with assistive device  Cognitive Function: MMSE - Mini Mental State Exam 09/05/2017 08/25/2016 09/02/2015  Orientation to time 4 5 5   Orientation to Place 5 5 5   Registration 3 3 3   Attention/ Calculation 5 5 5   Recall 3 2  2  Language- name 2 objects 2 2 2   Language- repeat 1 1 1   Language- follow 3 step command 3 3 3   Language- read & follow direction 1 1 1   Write a sentence 1 1 1   Copy design 1 1 1   Total score 29 29 29         Immunizations Immunization History  Administered Date(s) Administered  . Influenza Split 10/20/2011, 11/03/2012  . Influenza, High Dose Seasonal PF 10/24/2018  . Influenza,inj,Quad PF,6+ Mos 09/29/2012, 11/09/2013  . Influenza-Unspecified 10/17/2014, 10/23/2015, 10/20/2016, 10/17/2017, 10/16/2019  . Moderna SARS-COVID-2 Vaccination  01/15/2019, 02/12/2019, 11/26/2019  . PPD Test 08/21/2013, 03/04/2014  . Pneumococcal Conjugate-13 08/21/2013  . Pneumococcal Polysaccharide-23 09/08/2016  . Tdap 05/20/2008  . Tetanus 11/02/2018  . Zoster 11/01/2012    TDAP status: Up to date Flu Vaccine status: Up to date Pneumococcal vaccine status: Up to date Covid-19 vaccine status: Completed vaccines  Qualifies for Shingles Vaccine? Yes   Zostavax completed Yes   Shingrix Completed?: Yes  Screening Tests Health Maintenance  Topic Date Due  . TETANUS/TDAP  11/01/2028  . INFLUENZA VACCINE  Completed  . DEXA SCAN  Completed  . COVID-19 Vaccine  Completed  . PNA vac Low Risk Adult  Completed    Health Maintenance  There are no preventive care reminders to display for this patient.  Colorectal cancer screening: No longer required.  Mammogram status: No longer required.  Bone Density status: Completed 2019. Results reflect: Bone density results: OSTEOPOROSIS. Repeat every 2-3  years.  Lung Cancer Screening: (Low Dose CT Chest recommended if Age 31-80 years, 30 pack-year currently smoking OR have quit w/in 15years.) does not qualify.   Lung Cancer Screening Referral:   Additional Screening:  Hepatitis C Screening: does not qualify; Completed   Vision Screening: Recommended annual ophthalmology exams for early detection of glaucoma and other disorders of the eye. Is the patient up to date with their annual eye exam?  Yes  Who is the provider or what is the name of the office in which the patient attends annual eye exams?  If pt is not established with a provider, would they like to be referred to a provider to establish care? No .   Dental Screening: Recommended annual dental exams for proper oral hygiene  Community Resource Referral / Chronic Care Management: CRR required this visit?  No   CCM required this visit?  No      Plan:     I have personally reviewed and noted the following in the patient's chart:     . Medical and social history . Use of alcohol, tobacco or illicit drugs  . Current medications and supplements . Functional ability and status . Nutritional status . Physical activity . Advanced directives . List of other physicians . Hospitalizations, surgeries, and ER visits in previous 12 months . Vitals . Screenings to include cognitive, depression, and falls . Referrals and appointments  In addition, I have reviewed and discussed with patient certain preventive protocols, quality metrics, and best practice recommendations. A written personalized care plan for preventive services as well as general preventive health recommendations were provided to patient.     Raghav Verrilli X Melanny Wire, NP   12/05/2019   Nurse Notes: reviewed.

## 2019-12-14 DIAGNOSIS — B351 Tinea unguium: Secondary | ICD-10-CM | POA: Diagnosis not present

## 2019-12-14 DIAGNOSIS — L602 Onychogryphosis: Secondary | ICD-10-CM | POA: Diagnosis not present

## 2019-12-14 DIAGNOSIS — Q6689 Other  specified congenital deformities of feet: Secondary | ICD-10-CM | POA: Diagnosis not present

## 2020-01-02 ENCOUNTER — Other Ambulatory Visit: Payer: Self-pay | Admitting: *Deleted

## 2020-01-02 NOTE — Telephone Encounter (Signed)
Received refill request from [facility] Pended Rx and sent to [facility] for approval.

## 2020-01-03 MED ORDER — PREGABALIN 25 MG PO CAPS: 25.0000 mg | ORAL_CAPSULE | Freq: Every morning | ORAL | 0 refills | Status: DC

## 2020-01-17 ENCOUNTER — Non-Acute Institutional Stay: Payer: Medicare Other | Admitting: Internal Medicine

## 2020-01-17 ENCOUNTER — Encounter: Payer: Self-pay | Admitting: Internal Medicine

## 2020-01-17 DIAGNOSIS — G629 Polyneuropathy, unspecified: Secondary | ICD-10-CM | POA: Diagnosis not present

## 2020-01-17 DIAGNOSIS — F339 Major depressive disorder, recurrent, unspecified: Secondary | ICD-10-CM | POA: Diagnosis not present

## 2020-01-17 DIAGNOSIS — R1319 Other dysphagia: Secondary | ICD-10-CM

## 2020-01-17 DIAGNOSIS — R42 Dizziness and giddiness: Secondary | ICD-10-CM | POA: Diagnosis not present

## 2020-01-17 DIAGNOSIS — R634 Abnormal weight loss: Secondary | ICD-10-CM

## 2020-01-17 DIAGNOSIS — R1310 Dysphagia, unspecified: Secondary | ICD-10-CM | POA: Diagnosis not present

## 2020-01-17 DIAGNOSIS — I4891 Unspecified atrial fibrillation: Secondary | ICD-10-CM | POA: Diagnosis not present

## 2020-01-17 DIAGNOSIS — R531 Weakness: Secondary | ICD-10-CM

## 2020-01-17 NOTE — Progress Notes (Signed)
Location:    [place] Room Number: 1 Place of Service:  ALF [phone]) Provider:  Veleta Miners MD  [name], MD  Patient Care Team: [name], MD as PCP - General (Internal Medicine) Angelia Mould, MD as Attending Physician (Vascular Surgery) [name], MD (Inactive) as Attending Physician (Obstetrics and Gynecology) [name], MD (Orthopedic Surgery) [name], MD (Ophthalmology) [name], MD as Consulting Physician (Urology) [name], MD as Consulting Physician (Cardiology) [name], PA-C as Physician Assistant (Otolaryngology) [name], MD as Consulting Physician (Otolaryngology) [name], MD as Consulting Physician (Dermatology) [name], MD as Consulting Physician (Physical Medicine and Rehabilitation) [name], MD as Attending Physician (Gastroenterology) Mast, Man X, NP as Nurse Practitioner (Internal Medicine) Kathrynn Ducking, MD as Consulting Physician (Neurology) Debara Pickett Nadean Corwin, MD as Consulting Physician (Cardiology) Druscilla Brownie, MD as Consulting Physician (Dermatology) [name], [name], NP as Nurse Practitioner (Family Medicine)  Extended Emergency Contact Information Primary Emergency Contact: [name] of [place] Phone: [phone] Mobile Phone: [phone] Relation: Son Secondary Emergency Contact: [name] Mobile Phone: [phone] Relation: Friend  Code Status:  Full Code Goals of care: Advanced Directive information Advanced Directives 12/04/2019  Does Patient Have a Medical Advance Directive? Yes  Type of Paramedic of [place];Living will  Does patient want to make changes to medical advance directive? No - Patient declined  Copy of [place] in Chart? Yes - validated most recent copy scanned in chart (See row information)  Would patient like information on creating a medical  advance directive? -     Chief Complaint  Patient presents with  . Acute Visit    Dizziness    HPI:  Pt is a 87 y.o. female seen today for an acute visit for ? Dizziness and feeling weak  Patient has h/ochronic atrial fibrillation not on any coagulation,  GERD, hypothyroidism, hyperlipidemia, neuropathy,,  cognitive impairment, H/o Syncope, h/o IBS and Esophageal Stenosiss/p Dilatation.  She also Has H/o Retinal Vein Occlusion with Edema S/P Vitreal Injections  Patient c/o Episode of Dizziness and Feeling weak yesterday. Got very upset with Nurses that no one is helping her She has done this before. It usually happens in the morning. CBG have been in Normal Limits during episodes It is hard to get detail history from her.She says it is resolved now and she feels fine today Denies Vertigo. Denies passing out. Just says feels dizzy some days Has lost weight again. Appetite is poor. No Nausea or vomiting No falls Walks with her walker. Usually stay in her room. ? Depression Also having some issues with her SOn   Past Medical History:  Diagnosis Date  . Abdominal bloating   . Atrial fibrillation ([place])   . Bruises easily   . Carotid artery occlusion    LEFT  . Cricopharyngeal dysphagia   . Dizziness   . DOE (dyspnea on exertion) 04/02/2014  . Fall at home Sept 2013, Dec. 2013  Jun 08, 2012  . GERD (gastroesophageal reflux disease) 02/11/2015  . Headache(784.0)   . Hoarseness   . Hypercholesterolemia   . Hyperglycemia 04/02/2014   Glucose 178 mg percent on 01/22/2014 04/05/14 Hgb A1c 6.6 Diet controlled.    . Hypertensive retinopathy    OU  . Hypothyroidism 04/15/2015  . Neuropathy    PERIPHERAL  . Pruritus   . Scoliosis   . Varicose veins   . Zenker's diverticulum  Past Surgical History:  Procedure Laterality Date  . ABDOMINAL HYSTERECTOMY  1954  . CATARACT EXTRACTION Bilateral   . CHOLECYSTECTOMY  1997  . ESOPHAGOGASTRODUODENOSCOPY N/A 03/30/2019   Procedure:  ESOPHAGOGASTRODUODENOSCOPY (EGD);  Surgeon: Jerene Bears, MD;  Location: [facility];  Service: Gastroenterology;  Laterality: N/A;  . ESOPHAGOGASTRODUODENOSCOPY (EGD) WITH PROPOFOL N/A 11/25/2016   Procedure: ESOPHAGOGASTRODUODENOSCOPY (EGD) WITH PROPOFOL;  Surgeon: [name], MD;  Location: [facility];  Service: Endoscopy;  Laterality: N/A;  . EYE SURGERY Bilateral    Cat Sx  . SPINE SURGERY  1997   correct scoliosis    No Known Allergies  Allergies as of 01/17/2020   No Known Allergies     Medication List       Accurate as of January 17, 2020 10:37 AM. If you have any questions, ask your nurse or doctor.        acetaminophen 500 MG tablet Commonly known as: TYLENOL Take 500 mg by mouth 2 (two) times daily.   acetaminophen 325 MG tablet Commonly known as: TYLENOL Take 650 mg by mouth every 8 (eight) hours as needed.   aspirin 81 MG chewable tablet Chew 81 mg by mouth daily.   atenolol 25 MG tablet Commonly known as: TENORMIN Take 12.5 mg by mouth 2 (two) times daily. Hold if SBP < 100 or HR < 60   benzocaine-menthol 6-10 MG lozenge Commonly known as: CHLORAEPTIC Take 1 lozenge by mouth every 2 (two) hours as needed for sore throat.   Biotin 5000 MCG Tabs Take 5,000 mcg by mouth daily.   famotidine 20 MG tablet Commonly known as: PEPCID Take 20 mg by mouth daily.   fluticasone 50 MCG/ACT nasal spray Commonly known as: FLONASE Place 2 sprays into both nostrils daily.   hydrogen peroxide 1.5 % Soln Use 15 ml orally, swish and spit as needed for dry mouth.   IBgard 90 MG Cpcr Generic drug: Peppermint Oil Take 2 capsules by mouth 2 (two) times daily.   ipratropium 0.06 % nasal spray Commonly known as: ATROVENT Place 2 sprays into both nostrils 2 (two) times daily as needed for rhinitis.   lactose free nutrition Liqd Take 237 mLs by mouth 2 (two) times daily with a meal. Per request of resident, states can't eat if takes it later    lidocaine-prilocaine cream Commonly known as: EMLA Apply 1 application topically daily as needed.   loratadine 10 MG tablet Commonly known as: CLARITIN Take 10 mg by mouth daily as needed for allergies.   Menthol (Topical Analgesic) 4 % Gel Apply 1 application topically every 8 (eight) hours. Apply to LLE   ondansetron 4 MG tablet Commonly known as: ZOFRAN Take 4 mg by mouth every 6 (six) hours as needed for nausea or vomiting.   polyethylene glycol 17 g packet Commonly known as: MIRALAX / GLYCOLAX Take 17 g daily as needed by mouth (for constipation).   pregabalin 50 MG capsule Commonly known as: LYRICA Take 25 mg by mouth at bedtime.   pregabalin 25 MG capsule Commonly known as: LYRICA Take 1 capsule (25 mg total) by mouth in the morning.   SalivaMAX Pack Use as directed 1 Package in the mouth or throat. mix with 5mL's of H20; swish and spit BID; may keep in bathroom; may self administer.   sennosides-docusate sodium 8.6-50 MG tablet Commonly known as: SENOKOT-S Take 2 tablets by mouth daily.   sodium phosphate 7-19 GM/118ML Enem Place 1 enema rectally as needed for severe constipation.  Give Fleet enema per rectum X 1 after removing hard stool or if soft stool is present in rectum.   Systane Balance 0.6 % Soln Generic drug: Propylene Glycol Give 1 drop to both eyes once daily as needed for dryness   traMADol 50 MG tablet Commonly known as: ULTRAM Take 50 mg by mouth daily as needed.   traMADol 50 MG tablet Commonly known as: ULTRAM Take 1 tablet (50 mg total) by mouth 2 (two) times daily.   triamcinolone 0.1 % Commonly known as: KENALOG Apply 1 application topically as needed. Apply to itchy areas as needed. May keep in room and self apply   [facility] by mouth. Take 1 in the morning.   Vitamin B-2 25 MG Tabs Take 1 tablet by mouth daily.       Review of Systems  Constitutional: Positive for activity change, appetite change  and unexpected weight change.  HENT: Negative.   Respiratory: Negative.   Cardiovascular: Negative.   Gastrointestinal: Negative.   Genitourinary: Negative.   Musculoskeletal: Negative.   Skin: Negative.   Neurological: Positive for dizziness.  Psychiatric/Behavioral: Positive for dysphoric mood.    Immunization History  Administered Date(s) Administered  . Influenza Split 10/20/2011, 11/03/2012  . Influenza, High Dose Seasonal PF 10/24/2018  . Influenza,inj,Quad PF,6+ Mos 09/29/2012, 11/09/2013  . Influenza-Unspecified 10/17/2014, 10/23/2015, 10/20/2016, 10/17/2017, 10/16/2019  . Moderna Sars-Covid-2 Vaccination 01/15/2019, 02/12/2019, 11/26/2019  . PPD Test 08/21/2013, 03/04/2014  . Pneumococcal Conjugate-13 08/21/2013  . Pneumococcal Polysaccharide-23 09/08/2016  . Tdap 05/20/2008  . Tetanus 11/02/2018  . Zoster 11/01/2012   Pertinent  Health Maintenance Due  Topic Date Due  . INFLUENZA VACCINE  Completed  . DEXA SCAN  Completed  . PNA vac Low Risk Adult  Completed   Fall Risk  11/04/2017 09/05/2017 09/22/2016 08/25/2016 05/31/2016  Falls in the past year? Yes Yes Yes Yes No  Number falls in past yr: 2 or more 2 or more 1 2 or more -  Comment - - - - -  Injury with Fall? Yes Yes Yes Yes -  Comment - - fx 3 ribs - -  Risk Factor Category  - - - - -  Risk for fall due to : - - Impaired mobility;Impaired balance/gait - -  Risk for fall due to: Comment - - - - -   Functional Status Survey:    Vitals:   01/17/20 1019  BP: 108/66  Pulse: 68  Resp: 18  Temp: (!) 97.2 F (36.2 C)  SpO2: 96%  Weight: 86 lb 9.6 oz (39.3 kg)  Height: 5\' 2"  (1.575 m)   Body mass index is 15.84 kg/m. Physical Exam  Constitutional: Oriented to person, place, and time. Well-developed But very frail HENT:  Head: Normocephalic.  Mouth/Throat: Oropharynx is clear and moist.  Eyes: Pupils are equal, round, and reactive to light.  Neck: Neck supple.  Cardiovascular: Normal rate and normal  heart sounds.  No murmur heard. Pulmonary/Chest: Effort normal and breath sounds normal. No respiratory distress. No wheezes. She has no rales.  Abdominal: Soft. Bowel sounds are normal. No distension. There is no tenderness. There is no rebound.  Musculoskeletal: No edema.  Lymphadenopathy: none Neurological: Alert and oriented to person, place, and time.  Skin: Skin is warm and dry.  Psychiatric: Normal mood and affect. Behavior is normal. Thought content normal.    Labs reviewed: Recent Labs    05/17/19 0000 09/03/19 0000  NA 139 139  K 4.3 4.7  CL 102 103  CO2 33* 30*  BUN 14 15  CREATININE 0.9 0.9  CALCIUM 9.1 9.1   Recent Labs    05/17/19 0000 09/03/19 0000  AST 22 20  ALT 11 11  ALKPHOS 66 62  ALBUMIN 3.5 3.3*   Recent Labs    05/17/19 0000 09/03/19 0000 12/04/19 0000  WBC 6.2 5.8 7.1  NEUTROABS 3,317 3,231.00 4,012.00  HGB 13.7 13.5 13.5  HCT 40 39 39  PLT 196 182 153   Lab Results  Component Value Date   TSH 2.68 09/03/2019   Lab Results  Component Value Date   HGBA1C 5.8 12/28/2018   Lab Results  Component Value Date   CHOL 187 11/03/2017   HDL 84 (A) 11/03/2017   LDLCALC 80 11/03/2017   TRIG 136 11/03/2017   CHOLHDL 2.1 07/29/2016    Significant Diagnostic Results in last 30 days:  No results found.  Assessment/Plan Dizziness Resolved per Patient. ? Vertigo Has done this before that she will complain and then say her symptoms are resolved ? Etiology On Very low dose of Lopressor. Would not change right now Check CBC and CMP  Weakness,Weight loss Depression, recurrent (HCC) Most Symptoms in the morning  Will try Remeron 7.5 mg 3/week for 2 weeks and then qhs   Dysphagia, unspecified type S/p Dilatation Doing well Neuropathy On Lyrica Atrial fibrillation with RVR (HCC) Only on Lopressor  No Anticoagulation due to age and fraility  Cognitive and behavioral changes Staying independent   Constipation Follows with GI but  has been doing okay on IBgard and MiraLAX  Retinal Vein Oclusion with Macular edema Follows with ophthalmology States does not have good vision in 1 eye  Dysphonia  follows with ENT    Family/ staff Communication:   Labs/tests ordered:  CBC,CMP

## 2020-01-21 DIAGNOSIS — I482 Chronic atrial fibrillation, unspecified: Secondary | ICD-10-CM | POA: Diagnosis not present

## 2020-01-21 DIAGNOSIS — R609 Edema, unspecified: Secondary | ICD-10-CM | POA: Diagnosis not present

## 2020-01-21 DIAGNOSIS — I5022 Chronic systolic (congestive) heart failure: Secondary | ICD-10-CM | POA: Diagnosis not present

## 2020-01-21 DIAGNOSIS — Z79899 Other long term (current) drug therapy: Secondary | ICD-10-CM | POA: Diagnosis not present

## 2020-01-21 LAB — BASIC METABOLIC PANEL
BUN: 25 — AB (ref 4–21)
CO2: 30 — AB (ref 13–22)
Chloride: 104 (ref 99–108)
Creatinine: 1 (ref 0.5–1.1)
Glucose: 58
Potassium: 4.8 (ref 3.4–5.3)
Sodium: 141 (ref 137–147)

## 2020-01-21 LAB — HEPATIC FUNCTION PANEL
ALT: 12 (ref 7–35)
AST: 20 (ref 13–35)
Alkaline Phosphatase: 69 (ref 25–125)
Bilirubin, Total: 0.3

## 2020-01-21 LAB — COMPREHENSIVE METABOLIC PANEL
Albumin: 3.3 — AB (ref 3.5–5.0)
Calcium: 9.2 (ref 8.7–10.7)
GFR calc Af Amer: 53
GFR calc non Af Amer: 46
Globulin: 2.4

## 2020-01-21 LAB — CBC AND DIFFERENTIAL
HCT: 39 (ref 36–46)
Hemoglobin: 13.6 (ref 12.0–16.0)
Neutrophils Absolute: 3808
Platelets: 135 — AB (ref 150–399)
WBC: 6.8

## 2020-01-21 LAB — CBC: RBC: 3.91 (ref 3.87–5.11)

## 2020-01-22 ENCOUNTER — Encounter: Payer: Self-pay | Admitting: Nurse Practitioner

## 2020-01-22 DIAGNOSIS — N183 Chronic kidney disease, stage 3 unspecified: Secondary | ICD-10-CM | POA: Insufficient documentation

## 2020-01-22 DIAGNOSIS — E1322 Other specified diabetes mellitus with diabetic chronic kidney disease: Secondary | ICD-10-CM | POA: Insufficient documentation

## 2020-01-24 ENCOUNTER — Non-Acute Institutional Stay: Payer: Medicare Other | Admitting: Internal Medicine

## 2020-01-24 ENCOUNTER — Encounter: Payer: Self-pay | Admitting: Internal Medicine

## 2020-01-24 DIAGNOSIS — R42 Dizziness and giddiness: Secondary | ICD-10-CM

## 2020-01-24 DIAGNOSIS — R634 Abnormal weight loss: Secondary | ICD-10-CM

## 2020-01-24 DIAGNOSIS — R1310 Dysphagia, unspecified: Secondary | ICD-10-CM | POA: Diagnosis not present

## 2020-01-24 DIAGNOSIS — I4891 Unspecified atrial fibrillation: Secondary | ICD-10-CM

## 2020-01-24 DIAGNOSIS — G629 Polyneuropathy, unspecified: Secondary | ICD-10-CM | POA: Diagnosis not present

## 2020-01-24 DIAGNOSIS — R410 Disorientation, unspecified: Secondary | ICD-10-CM | POA: Diagnosis not present

## 2020-01-24 DIAGNOSIS — F339 Major depressive disorder, recurrent, unspecified: Secondary | ICD-10-CM | POA: Diagnosis not present

## 2020-01-24 DIAGNOSIS — R531 Weakness: Secondary | ICD-10-CM | POA: Diagnosis not present

## 2020-01-24 NOTE — Progress Notes (Signed)
Location:  [place] Room Number: 1-A Place of Service:  ALF ([phone]) Provider:  [name], MD  Patient Care Team: [name], MD as PCP - General (Internal Medicine) Angelia Mould, MD as Attending Physician (Vascular Surgery) [name], MD (Inactive) as Attending Physician (Obstetrics and Gynecology) [name], MD (Orthopedic Surgery) [name], MD (Ophthalmology) [name], MD as Consulting Physician (Urology) [name], MD as Consulting Physician (Cardiology) [name], PA-C as Physician Assistant (Otolaryngology) [name], MD as Consulting Physician (Otolaryngology) [name], MD as Consulting Physician (Dermatology) [name], MD as Consulting Physician (Physical Medicine and Rehabilitation) [name], MD as Attending Physician (Gastroenterology) Mast, Man X, NP as Nurse Practitioner (Internal Medicine) Kathrynn Ducking, MD as Consulting Physician (Neurology) Debara Pickett Nadean Corwin, MD as Consulting Physician (Cardiology) Druscilla Brownie, MD as Consulting Physician (Dermatology) [name], [name], NP as Nurse Practitioner (Family Medicine)  Extended Emergency Contact Information Primary Emergency Contact: [name] of [place] Phone: [phone] Mobile Phone: [phone] Relation: Son Secondary Emergency Contact: [name] Mobile Phone: [phone] Relation: Friend  Code Status:  FULL CODE Goals of care: Advanced Directive information Advanced Directives 12/04/2019  Does Patient Have a Medical Advance Directive? Yes  Type of Paramedic of [place];Living will  Does patient want to make changes to medical advance directive? No - Patient declined  Copy of [place] in Chart? Yes - validated most recent copy scanned in chart (See row information)  Would patient like information on creating a medical advance directive? -      Chief Complaint  Patient presents with  . Acute Visit    Patient is seen for confusion    HPI:  Pt is a 87 y.o. female seen today for an acute visit for confusion as noticed by the nurses  Patient has h/ochronic atrial fibrillation not on any coagulation, GERD, hypothyroidism, hyperlipidemia, neuropathy,,  cognitive impairment, H/o Syncope, h/o IBS and Esophageal Stenosiss/p Dilatation.  She also Has H/o Retinal Vein Occlusion with Edema S/P Vitreal Injections  I had seen patient few weeks ago for episodes of dizziness and feeling weak especially in the morning. She also had lost some weight. Appetite is poor. I had started her on Remeron very low-dose. But now nurses have noticed that she has been more confused. They noticed that she was talking about her dead husband and looking for him.  When asked about it today patient said that she got confused between her son and husband. Her mental status seemed at baseline. She did not have any acute complaints no fever no chills no cough no dysuria. Past Medical History:  Diagnosis Date  . Abdominal bloating   . Atrial fibrillation ([place])   . Bruises easily   . Carotid artery occlusion    LEFT  . Cricopharyngeal dysphagia   . Dizziness   . DOE (dyspnea on exertion) 04/02/2014  . Fall at home Sept 2013, Dec. 2013  Jun 08, 2012  . GERD (gastroesophageal reflux disease) 02/11/2015  . Headache(784.0)   . Hoarseness   . Hypercholesterolemia   . Hyperglycemia 04/02/2014   Glucose 178 mg percent on 01/22/2014 04/05/14 Hgb A1c 6.6 Diet controlled.    . Hypertensive retinopathy    OU  . Hypothyroidism 04/15/2015  . Neuropathy    PERIPHERAL  . Pruritus   . Scoliosis   . Varicose veins   . Zenker's diverticulum    Past Surgical History:  Procedure Laterality  Date  . ABDOMINAL HYSTERECTOMY  1954  . CATARACT EXTRACTION Bilateral   . CHOLECYSTECTOMY  1997  . ESOPHAGOGASTRODUODENOSCOPY N/A 03/30/2019   Procedure:  ESOPHAGOGASTRODUODENOSCOPY (EGD);  Surgeon: Jerene Bears, MD;  Location: [facility];  Service: Gastroenterology;  Laterality: N/A;  . ESOPHAGOGASTRODUODENOSCOPY (EGD) WITH PROPOFOL N/A 11/25/2016   Procedure: ESOPHAGOGASTRODUODENOSCOPY (EGD) WITH PROPOFOL;  Surgeon: [name], MD;  Location: [facility];  Service: Endoscopy;  Laterality: N/A;  . EYE SURGERY Bilateral    Cat Sx  . SPINE SURGERY  1997   correct scoliosis    No Known Allergies  Outpatient Encounter Medications as of 01/24/2020  Medication Sig  . acetaminophen (TYLENOL) 325 MG tablet Take 650 mg by mouth every 8 (eight) hours as needed.  Marland Kitchen acetaminophen (TYLENOL) 500 MG tablet Take 500 mg by mouth 2 (two) times daily.  . Artificial Saliva (SALIVAMAX) PACK Use as directed 1 Package in the mouth or throat. mix with 61mL's of H20; swish and spit BID; may keep in bathroom; may self administer.  Marland Kitchen aspirin 81 MG chewable tablet Chew 81 mg by mouth daily.  Marland Kitchen atenolol (TENORMIN) 25 MG tablet Take 12.5 mg by mouth 2 (two) times daily. Hold if SBP < 100 or HR < 60  . benzocaine-menthol (CHLORAEPTIC) 6-10 MG lozenge Take 1 lozenge by mouth every 2 (two) hours as needed for sore throat.  . Biotin 5000 MCG TABS Take 5,000 mcg by mouth daily.  . Calcium-Vitamin D-Vitamin K (VIACTIV PO) Take 1 tablet by mouth daily.  . famotidine (PEPCID) 20 MG tablet Take 20 mg by mouth daily.  . fluticasone (FLONASE) 50 MCG/ACT nasal spray Place 2 sprays into both nostrils daily.  . hydrogen peroxide 1.5 % SOLN Use 15 ml orally, swish and spit as needed for dry mouth.  Marland Kitchen ipratropium (ATROVENT) 0.06 % nasal spray Place 2 sprays into both nostrils 2 (two) times daily as needed for rhinitis.  Prudencio Burly ([place]) [place] by mouth. Take 1 in the morning.  . lactose free nutrition (BOOST PLUS) LIQD Take 237 mLs by mouth 2 (two) times daily with a meal. Per request of resident, states can't eat if takes it later  .  lidocaine-prilocaine (EMLA) cream Apply 1 application topically daily as needed.  . loratadine (CLARITIN) 10 MG tablet Take 10 mg by mouth daily as needed for allergies.  . Menthol, Topical Analgesic, 4 % GEL Apply 1 application topically every 8 (eight) hours. Apply to LLE  . mirtazapine (REMERON) 7.5 MG tablet Take 7.5 mg by mouth See admin instructions. MWF x2 weeks and then QHS  . ondansetron (ZOFRAN) 4 MG tablet Take 4 mg by mouth every 6 (six) hours as needed for nausea or vomiting.  Marland Kitchen Peppermint Oil (IBGARD) 90 MG CPCR Take 2 capsules by mouth 2 (two) times daily.   . polyethylene glycol (MIRALAX / GLYCOLAX) packet Take 8.5-17 g by mouth See admin instructions. 1/2 dose QAM scheduled, 1 dose qd prn  . pregabalin (LYRICA) 25 MG capsule Take 1 capsule (25 mg total) by mouth in the morning.  . pregabalin (LYRICA) 50 MG capsule Take 25 mg by mouth at bedtime.  Marland Kitchen Propylene Glycol (SYSTANE BALANCE) 0.6 % SOLN Give 1 drop to both eyes once daily as needed for dryness  . Riboflavin (VITAMIN B-2) 25 MG TABS Take 1 tablet by mouth daily.  . sennosides-docusate sodium (SENOKOT-S) 8.6-50 MG tablet Take 2 tablets by mouth daily.   . sodium phosphate (FLEET) 7-19 GM/118ML  ENEM Place 1 enema rectally as needed for severe constipation. Give Fleet enema per rectum X 1 after removing hard stool or if soft stool is present in rectum.  . traMADol (ULTRAM) 50 MG tablet Take 50 mg by mouth daily as needed.   . traMADol (ULTRAM) 50 MG tablet Take 1 tablet (50 mg total) by mouth 2 (two) times daily.  Marland Kitchen triamcinolone cream (KENALOG) 0.1 % Apply 1 application topically as needed. Apply to itchy areas as needed. May keep in room and self apply   No facility-administered encounter medications on file as of 01/24/2020.    Review of Systems  Review of Systems  Constitutional: Negative for activity change, appetite change, chills, diaphoresis, fatigue and fever.  HENT: Negative for mouth sores, postnasal drip,  rhinorrhea, sinus pain and sore throat.   Respiratory: Negative for apnea, cough, chest tightness, shortness of breath and wheezing.   Cardiovascular: Negative for chest pain, palpitations and leg swelling.  Gastrointestinal: Negative for abdominal distention, abdominal pain, constipation, diarrhea, nausea and vomiting.  Genitourinary: Negative for dysuria and frequency.  Musculoskeletal: Negative for arthralgias, joint swelling and myalgias.  Skin: Negative for rash.  Neurological: Negative for dizziness, syncope, weakness, light-headedness and numbness.  Psychiatric/Behavioral: Negative for behavioral problems, confusion and sleep disturbance.     Immunization History  Administered Date(s) Administered  . Influenza Split 10/20/2011, 11/03/2012  . Influenza, High Dose Seasonal PF 10/24/2018  . Influenza,inj,Quad PF,6+ Mos 09/29/2012, 11/09/2013  . Influenza-Unspecified 10/17/2014, 10/23/2015, 10/20/2016, 10/17/2017, 10/16/2019  . Moderna Sars-Covid-2 Vaccination 01/15/2019, 02/12/2019, 11/26/2019  . PPD Test 08/21/2013, 03/04/2014  . Pneumococcal Conjugate-13 08/21/2013  . Pneumococcal Polysaccharide-23 09/08/2016  . Tdap 05/20/2008  . Tetanus 11/02/2018  . Zoster 11/01/2012   Pertinent  Health Maintenance Due  Topic Date Due  . FOOT EXAM  Never done  . OPHTHALMOLOGY EXAM  Never done  . URINE MICROALBUMIN  Never done  . HEMOGLOBIN A1C  06/28/2019  . INFLUENZA VACCINE  Completed  . DEXA SCAN  Completed  . PNA vac Low Risk Adult  Completed   Fall Risk  11/04/2017 09/05/2017 09/22/2016 08/25/2016 05/31/2016  Falls in the past year? Yes Yes Yes Yes No  Number falls in past yr: 2 or more 2 or more 1 2 or more -  Comment - - - - -  Injury with Fall? Yes Yes Yes Yes -  Comment - - fx 3 ribs - -  Risk Factor Category  - - - - -  Risk for fall due to : - - Impaired mobility;Impaired balance/gait - -  Risk for fall due to: Comment - - - - -   Functional Status Survey:    Vitals:    01/24/20 1441  BP: (!) 145/80  Pulse: 87  Resp: 18  Temp: (!) 97.3 F (36.3 C)  TempSrc: Oral  SpO2: 96%  Weight: 86 lb 9.6 oz (39.3 kg)  Height: 5\' 2"  (1.575 m)   Body mass index is 15.84 kg/m. Physical Exam  Constitutional: Oriented to person, place, and time. Well-developed  HENT:  Head: Normocephalic.  Mouth/Throat: Oropharynx is clear and moist.  Eyes: Pupils are equal, round, and reactive to light.  Neck: Neck supple.  Cardiovascular: Normal rate and normal heart sounds.  No murmur heard. Pulmonary/Chest: Effort normal and breath sounds normal. No respiratory distress. No wheezes. She has no rales.  Abdominal: Soft. Bowel sounds are normal. No distension. There is no tenderness. There is no rebound.  Musculoskeletal: No edema.  Lymphadenopathy: none Neurological:  Alert and oriented to person, place, and time. Mental status seemed at baseline Skin: Skin is warm and dry.  Psychiatric: Normal mood and affect. Behavior is normal. Thought content normal.    Labs reviewed: Recent Labs    05/17/19 0000 09/03/19 0000 01/21/20 0000  NA 139 139 141  K 4.3 4.7 4.8  CL 102 103 104  CO2 33* 30* 30*  BUN 14 15 25*  CREATININE 0.9 0.9 1.0  CALCIUM 9.1 9.1 9.2   Recent Labs    05/17/19 0000 09/03/19 0000 01/21/20 0000  AST 22 20 20   ALT 11 11 12   ALKPHOS 66 62 69  ALBUMIN 3.5 3.3* 3.3*   Recent Labs    09/03/19 0000 12/04/19 0000 01/21/20 0000  WBC 5.8 7.1 6.8  NEUTROABS 3,231.00 4,012.00 3,808.00  HGB 13.5 13.5 13.6  HCT 39 39 39  PLT 182 153 135*   Lab Results  Component Value Date   TSH 2.68 09/03/2019   Lab Results  Component Value Date   HGBA1C 5.8 12/28/2018   Lab Results  Component Value Date   CHOL 187 11/03/2017   HDL 84 (A) 11/03/2017   LDLCALC 80 11/03/2017   TRIG 136 11/03/2017   CHOLHDL 2.1 07/29/2016    Significant Diagnostic Results in last 30 days:  No results found.  Assessment/Plan Confusion Discontinue Remeron Labs  were  normal Except CBG 58   Dizziness Low CBG  Will offer her Night time snack  Resolved per Patient. ? Vertigo Has done this before that she will complain and then say her symptoms are resolved ? Etiology On Very low dose of Lopressor. Would not change right now  Weight loss Hold off Remeron right now due to confusion  Other issues Dysphagia, unspecified type S/p Dilatation Doing well Neuropathy On Lyrica Atrial fibrillation with RVR (HCC) Only on Lopressor  No Anticoagulation due to age and fraility  Cognitive and behavioral changes Staying independent  Constipation Follows with GI but has been doing okay on IBgard and MiraLAX  Retinal Vein Oclusion with Macular edema Follows with ophthalmology States does not have good vision in 1 eye  Dysphonia  follows with ENT    Family/ staff Communication: Labs/tests ordered:

## 2020-02-01 DIAGNOSIS — R41841 Cognitive communication deficit: Secondary | ICD-10-CM | POA: Diagnosis not present

## 2020-02-05 DIAGNOSIS — R41841 Cognitive communication deficit: Secondary | ICD-10-CM | POA: Diagnosis not present

## 2020-02-07 DIAGNOSIS — R41841 Cognitive communication deficit: Secondary | ICD-10-CM | POA: Diagnosis not present

## 2020-02-10 ENCOUNTER — Other Ambulatory Visit: Payer: Self-pay

## 2020-02-10 ENCOUNTER — Emergency Department (HOSPITAL_COMMUNITY)
Admission: EM | Admit: 2020-02-10 | Discharge: 2020-02-10 | Disposition: A | Discharge status: Home / Self Care | Payer: Medicare Other | Attending: Emergency Medicine | Admitting: Emergency Medicine

## 2020-02-10 ENCOUNTER — Encounter (HOSPITAL_COMMUNITY): Payer: Self-pay

## 2020-02-10 ENCOUNTER — Emergency Department (HOSPITAL_COMMUNITY): Payer: Medicare Other

## 2020-02-10 DIAGNOSIS — H5461 Unqualified visual loss, right eye, normal vision left eye: Secondary | ICD-10-CM

## 2020-02-10 DIAGNOSIS — N183 Chronic kidney disease, stage 3 unspecified: Secondary | ICD-10-CM | POA: Insufficient documentation

## 2020-02-10 DIAGNOSIS — R4182 Altered mental status, unspecified: Secondary | ICD-10-CM | POA: Diagnosis not present

## 2020-02-10 DIAGNOSIS — E039 Hypothyroidism, unspecified: Secondary | ICD-10-CM | POA: Diagnosis not present

## 2020-02-10 DIAGNOSIS — Z7982 Long term (current) use of aspirin: Secondary | ICD-10-CM | POA: Insufficient documentation

## 2020-02-10 DIAGNOSIS — I4891 Unspecified atrial fibrillation: Secondary | ICD-10-CM | POA: Diagnosis not present

## 2020-02-10 DIAGNOSIS — Z7401 Bed confinement status: Secondary | ICD-10-CM | POA: Diagnosis not present

## 2020-02-10 DIAGNOSIS — H5462 Unqualified visual loss, left eye, normal vision right eye: Secondary | ICD-10-CM | POA: Diagnosis not present

## 2020-02-10 DIAGNOSIS — E1122 Type 2 diabetes mellitus with diabetic chronic kidney disease: Secondary | ICD-10-CM | POA: Diagnosis not present

## 2020-02-10 DIAGNOSIS — H53131 Sudden visual loss, right eye: Secondary | ICD-10-CM | POA: Insufficient documentation

## 2020-02-10 DIAGNOSIS — M2550 Pain in unspecified joint: Secondary | ICD-10-CM | POA: Diagnosis not present

## 2020-02-10 DIAGNOSIS — I10 Essential (primary) hypertension: Secondary | ICD-10-CM | POA: Diagnosis not present

## 2020-02-10 DIAGNOSIS — Z794 Long term (current) use of insulin: Secondary | ICD-10-CM | POA: Diagnosis not present

## 2020-02-10 LAB — I-STAT CHEM 8, ED
BUN: 25 mg/dL — ABNORMAL HIGH (ref 8–23)
Calcium, Ion: 1.19 mmol/L (ref 1.15–1.40)
Chloride: 102 mmol/L (ref 98–111)
Creatinine, Ser: 1 mg/dL (ref 0.44–1.00)
Glucose, Bld: 124 mg/dL — ABNORMAL HIGH (ref 70–99)
HCT: 42 % (ref 36.0–46.0)
Hemoglobin: 14.3 g/dL (ref 12.0–15.0)
Potassium: 4.3 mmol/L (ref 3.5–5.1)
Sodium: 138 mmol/L (ref 135–145)
TCO2: 28 mmol/L (ref 22–32)

## 2020-02-10 LAB — CBC
HCT: 41 % (ref 36.0–46.0)
Hemoglobin: 13.8 g/dL (ref 12.0–15.0)
MCH: 35.2 pg — ABNORMAL HIGH (ref 26.0–34.0)
MCHC: 33.7 g/dL (ref 30.0–36.0)
MCV: 104.6 fL — ABNORMAL HIGH (ref 80.0–100.0)
Platelets: 171 10*3/uL (ref 150–400)
RBC: 3.92 MIL/uL (ref 3.87–5.11)
RDW: 13.5 % (ref 11.5–15.5)
WBC: 10.1 10*3/uL (ref 4.0–10.5)
nRBC: 0 % (ref 0.0–0.2)

## 2020-02-10 LAB — COMPREHENSIVE METABOLIC PANEL
ALT: 19 U/L (ref 0–44)
AST: 27 U/L (ref 15–41)
Albumin: 3.6 g/dL (ref 3.5–5.0)
Alkaline Phosphatase: 62 U/L (ref 38–126)
Anion gap: 10 (ref 5–15)
BUN: 20 mg/dL (ref 8–23)
CO2: 28 mmol/L (ref 22–32)
Calcium: 9.6 mg/dL (ref 8.9–10.3)
Chloride: 99 mmol/L (ref 98–111)
Creatinine, Ser: 1.06 mg/dL — ABNORMAL HIGH (ref 0.44–1.00)
GFR, Estimated: 47 mL/min — ABNORMAL LOW (ref >60)
Glucose, Bld: 127 mg/dL — ABNORMAL HIGH (ref 70–99)
Potassium: 4.2 mmol/L (ref 3.5–5.1)
Sodium: 137 mmol/L (ref 135–145)
Total Bilirubin: 0.7 mg/dL (ref 0.3–1.2)
Total Protein: 6.7 g/dL (ref 6.5–8.1)

## 2020-02-10 LAB — DIFFERENTIAL
Abs Immature Granulocytes: 0.04 10*3/uL (ref 0.00–0.07)
Basophils Absolute: 0.1 10*3/uL (ref 0.0–0.1)
Basophils Relative: 1 %
Eosinophils Absolute: 0.1 10*3/uL (ref 0.0–0.5)
Eosinophils Relative: 1 %
Immature Granulocytes: 0 %
Lymphocytes Relative: 17 %
Lymphs Abs: 1.7 10*3/uL (ref 0.7–4.0)
Monocytes Absolute: 1.2 10*3/uL — ABNORMAL HIGH (ref 0.1–1.0)
Monocytes Relative: 12 %
Neutro Abs: 7 10*3/uL (ref 1.7–7.7)
Neutrophils Relative %: 69 %

## 2020-02-10 LAB — PROTIME-INR
INR: 1 (ref 0.8–1.2)
Prothrombin Time: 13.1 seconds (ref 11.4–15.2)

## 2020-02-10 LAB — APTT: aPTT: 34 seconds (ref 24–36)

## 2020-02-10 MED ORDER — SODIUM CHLORIDE 0.9% FLUSH
3.0000 mL | Freq: Once | INTRAVENOUS | Status: DC
Start: 2020-02-10 — End: 2020-02-11

## 2020-02-10 NOTE — Discharge Instructions (Signed)
Please follow with your primary care provider and your ophthalmologist regarding your visit today. Your blood work and CT imaging of your head are reassuring. Return for any concerning symptoms

## 2020-02-10 NOTE — ED Triage Notes (Addendum)
Patient arrived from [facility] and reports that she noticed her vision becoming unclear yesterday. Unsure if on awakening or after lunch. Patient alert and oriented. Denies pain, denies headache. Moves all extremities, speech clear.reports that her vision in right eye worse

## 2020-02-10 NOTE — ED Provider Notes (Signed)
[facility] EMERGENCY DEPARTMENT Provider Note   CSN: [id number] Arrival date & time: 02/10/20  1213     History No chief complaint on file.   Vanessa [name] is a 87 y.o. female has past medical history of atrial fibrillation not on anticoagulation, hypothyroidism, cognitive impairment, retinal vein occlusion with edema status post vitreal injections in the right eye, neuropathy.  She [facility] in [facility] [place] [facility] for vision loss.  Patient states that she is embarrassed that she is here as this is a chronic issue.  She states she sees an eye doctor for this though cannot recall his name.  She states her vision in the right is "dimmed" and is not worse than usual.  She endorses hunger currently.  States she does not feel weak anywhere else.  She does not feel ill.  Denies headache or eye pain.  Additional history obtained from [place], nurse at [facility].  She reviewed documentation from this morning.  Reports patient may have mention vision loss bilaterally though this only lasted for a few minutes and then she reported to be back at baseline with chronic right eye vision loss.  She ambulates with a walker.  Additional history obtained from chart review.  Patient is followed by Dr. Lyndel Safe.  Last note on 01/24/2020 reports patient has been having increased episodes of confusion.  The history is provided by the patient and medical records (nursing home staff).       Past Medical History:  Diagnosis Date  . Abdominal bloating   . Atrial fibrillation ([place])   . Bruises easily   . Carotid artery occlusion    LEFT  . Cricopharyngeal dysphagia   . Dizziness   . DOE (dyspnea on exertion) 04/02/2014  . Fall at home Sept 2013, Dec. 2013  Jun 08, 2012  . GERD (gastroesophageal reflux disease) 02/11/2015  . Headache(784.0)   . Hoarseness   . Hypercholesterolemia   . Hyperglycemia 04/02/2014   Glucose 178 mg percent on 01/22/2014 04/05/14 Hgb A1c 6.6 Diet  controlled.    . Hypertensive retinopathy    OU  . Hypothyroidism 04/15/2015  . Neuropathy    PERIPHERAL  . Pruritus   . Scoliosis   . Varicose veins   . Zenker's diverticulum     Patient Active Problem List   Diagnosis Date Noted  . Secondary diabetes mellitus with stage 3 chronic kidney disease (GFR 30-59) ([place]) 01/22/2020  . Urinary incontinence 12/03/2019  . Macular degeneration, age related 05/09/2019  . Choking 03/30/2019  . Cricopharyngeus muscle dysfunction   . Allergic rhinitis 02/01/2019  . Stomatitis 12/15/2018  . Mild cognitive impairment 08/01/2017  . Leg mass, left 04/01/2017  . Moderate protein-calorie malnutrition ([place]) 12/27/2016  . Closed fracture of nasal bones   . Severe protein-calorie malnutrition ([place])   . Atrial fibrillation with RVR ([place]) 12/02/2016  . Esophageal stenosis   . History of rib fracture 08/04/2016  . Age-related osteoporosis without current pathological fracture 08/04/2016  . Unsteady gait 04/19/2016  . Urinary frequency 12/09/2015  . OCD (obsessive compulsive disorder) 09/02/2015  . Hypothyroidism 04/15/2015  . GERD (gastroesophageal reflux disease) 02/11/2015  . Constipation 02/11/2015  . Cricopharyngeal hypertrophy 08/27/2014  . Dysphonia 08/27/2014  . 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 Chronic lower back pain 08/13/2014  . Carotid stenosis 08/13/2014  . Xerostomia 06/18/2014  . Tingling sensation-Left Leg 06/05/2014  . Hyperglycemia 04/02/2014  . Weight loss 04/02/2014  . Idiopathic scoliosis 04/02/2014  .  Hearing loss 04/02/2014  . Dysphagia 04/02/2014  . Seborrheic keratoses, inflamed 04/02/2014  . Weakness 09/12/2013  . Anxiety state 09/12/2013  . GI bleed 09/10/2013  . Other and unspecified ovarian cyst 09/10/2013  . Fall 09/03/2013  . PVD (peripheral vascular disease) ([place]) 02/21/2013  . Occlusion of left internal carotid artery 08/16/2012  . Varicose veins 12/22/2011  . Raynaud phenomenon 06/26/2010  .  Osteoarthritis 06/26/2010  . Neuropathy   . Hyperlipidemia     Past Surgical History:  Procedure Laterality Date  . ABDOMINAL HYSTERECTOMY  1954  . CATARACT EXTRACTION Bilateral   . CHOLECYSTECTOMY  1997  . ESOPHAGOGASTRODUODENOSCOPY N/A 03/30/2019   Procedure: ESOPHAGOGASTRODUODENOSCOPY (EGD);  Surgeon: Jerene Bears, MD;  Location: [facility];  Service: Gastroenterology;  Laterality: N/A;  . ESOPHAGOGASTRODUODENOSCOPY (EGD) WITH PROPOFOL N/A 11/25/2016   Procedure: ESOPHAGOGASTRODUODENOSCOPY (EGD) WITH PROPOFOL;  Surgeon: [name], MD;  Location: [facility];  Service: Endoscopy;  Laterality: N/A;  . EYE SURGERY Bilateral    Cat Sx  . SPINE SURGERY  1997   correct scoliosis     OB History   No obstetric history on file.     Family History  Adopted: Yes  Problem Relation Age of Onset  . Diabetes Mother   . Heart attack Mother   . Heart disease Mother        After age 22  . Heart attack Father   . Stroke Father   . Breast cancer Sister   . Heart disease Maternal Grandmother   . Cancer Son        adopted son. esophagus, stomach, liver  . Stomach cancer Neg Hx   . Colon cancer Neg Hx     Social History   Tobacco Use  . Smoking status: Never Smoker  . Smokeless tobacco: Never Used  Vaping Use  . Vaping Use: Never used  Substance Use Topics  . Alcohol use: No    Alcohol/week: 0.0 standard drinks  . Drug use: No    Home Medications Prior to Admission medications   Medication Sig Start Date End Date Taking? Authorizing Provider  acetaminophen (TYLENOL) 325 MG tablet Take 650 mg by mouth every 8 (eight) hours as needed.    [provider]  acetaminophen (TYLENOL) 500 MG tablet Take 500 mg by mouth 2 (two) times daily.    [provider]  Artificial Saliva [facility]) PACK Use as directed 1 Package in the mouth or throat. mix with 10mL's of H20; swish and spit BID; may keep in bathroom; may self administer.    [provider]   aspirin 81 MG chewable tablet Chew 81 mg by mouth daily.    [provider]  atenolol (TENORMIN) 25 MG tablet Take 12.5 mg by mouth 2 (two) times daily. Hold if SBP < 100 or HR < 60    [provider]  benzocaine-menthol (CHLORAEPTIC) 6-10 MG lozenge Take 1 lozenge by mouth every 2 (two) hours as needed for sore throat.    [provider]  Biotin 5000 MCG TABS Take 5,000 mcg by mouth daily.    [provider]  Calcium-Vitamin D-Vitamin K (VIACTIV PO) Take 1 tablet by mouth daily. 02/28/18   [provider]  famotidine (PEPCID) 20 MG tablet Take 20 mg by mouth daily.    [provider]  fluticasone (FLONASE) 50 MCG/ACT nasal spray Place 2 sprays into both nostrils daily.    [provider]  hydrogen peroxide 1.5 % SOLN Use 15 ml  orally, swish and spit as needed for dry mouth.    [provider]  ipratropium (ATROVENT) 0.06 % nasal spray Place 2 sprays into both nostrils 2 (two) times daily as needed for rhinitis.    [provider]  Lactobacillus-Inulin ([place]) [place] by mouth. Take 1 in the morning. 02/28/18   [provider]  lactose free nutrition (BOOST PLUS) LIQD Take 237 mLs by mouth 2 (two) times daily with a meal. Per request of resident, states can't eat if takes it later    [provider]  lidocaine-prilocaine (EMLA) cream Apply 1 application topically daily as needed.    [provider]  loratadine (CLARITIN) 10 MG tablet Take 10 mg by mouth daily as needed for allergies.    [provider]  Menthol, Topical Analgesic, 4 % GEL Apply 1 application topically every 8 (eight) hours. Apply to LLE    [provider]  ondansetron (ZOFRAN) 4 MG tablet Take 4 mg by mouth every 6 (six) hours as needed for nausea or vomiting.    [provider]  Peppermint Oil (IBGARD) 90 MG CPCR Take 2 capsules by mouth 2 (two) times daily.  09/26/18   [provider]  polyethylene glycol (MIRALAX / GLYCOLAX) packet Take 8.5-17 g by mouth See admin instructions. 1/2 dose QAM scheduled, 1 dose qd prn    [provider]  pregabalin (LYRICA) 25 MG capsule Take 1 capsule (25 mg total) by mouth in the morning. 01/03/20   Mast, Man X, NP  pregabalin (LYRICA) 50 MG capsule Take 25 mg by mouth at bedtime.    [provider]  Propylene Glycol (SYSTANE BALANCE) 0.6 % SOLN Give 1 drop to both eyes once daily as needed for dryness    [provider]  Riboflavin (VITAMIN B-2) 25 MG TABS Take 1 tablet by mouth daily.    [provider]  sennosides-docusate sodium (SENOKOT-S) 8.6-50 MG tablet Take 2 tablets by mouth daily.     [provider]  sodium phosphate (FLEET) 7-19 GM/118ML ENEM Place 1 enema rectally as needed for severe constipation. Give Fleet enema per rectum X 1 after removing hard stool or if soft stool is present in rectum.    [provider]  traMADol (ULTRAM) 50 MG tablet Take 50 mg by mouth daily as needed.     [provider]  traMADol (ULTRAM) 50 MG tablet Take 1 tablet (50 mg total) by mouth 2 (two) times daily. 09/18/19   [name], MD  triamcinolone cream (KENALOG) 0.1 % Apply 1 application topically as needed. Apply to itchy areas as needed. May keep in room and self apply    [provider]    Allergies    Patient has no known allergies.  Review of Systems   Review of Systems  Eyes: Positive for visual disturbance.  All other systems reviewed and are negative.   Physical Exam Updated Vital Signs BP (!) 172/90   Pulse 60   Temp 97.7 F (36.5 C) (Oral)   Resp 18   SpO2 94%   Physical Exam Vitals and nursing note reviewed.  Constitutional:      Appearance: She is well-developed and well-nourished.     Comments: Patient is alert, pleasant, cooperative on initial assessment.  Patient is appropriate and able to provide history. Thin   HENT:     Head:  Normocephalic and atraumatic.  Eyes:     Conjunctiva/sclera: Conjunctivae normal.  Cardiovascular:  Rate and Rhythm: Normal rate and regular rhythm.  Pulmonary:     Effort: Pulmonary effort is normal. No respiratory distress.     Breath sounds: Normal breath sounds.  Abdominal:     General: Bowel sounds are normal.     Palpations: Abdomen is soft.     Tenderness: There is no abdominal tenderness.  Skin:    General: Skin is warm.  Neurological:     Mental Status: She is alert.     Comments: Speech is fluent without aphasia.  Patient is hard of hearing.  No facial droop.  Extraocular movements are intact.  Decreased vision in the right eye, unable to identify how many fingers are being held up both with peripheral vision and central vision.  Left eye vision appears grossly intact, patient is able to identify the amount of fingers held up.  Equal grip strength to bilateral upper extremities.  Spontaneously moving all extremities without difficulty.  Psychiatric:        Mood and Affect: Mood and affect normal.        Behavior: Behavior normal.     ED Results / Procedures / Treatments   Labs (all labs ordered are listed, but only abnormal results are displayed) Labs Reviewed  CBC - Abnormal; Notable for the following components:      Result Value   MCV 104.6 (*)    MCH 35.2 (*)    All other components within normal limits  DIFFERENTIAL - Abnormal; Notable for the following components:   Monocytes Absolute 1.2 (*)    All other components within normal limits  COMPREHENSIVE METABOLIC PANEL - Abnormal; Notable for the following components:   Glucose, Bld 127 (*)    Creatinine, Ser 1.06 (*)    GFR, Estimated 47 (*)    All other components within normal limits  I-STAT CHEM 8, ED - Abnormal; Notable for the following components:   BUN 25 (*)    Glucose, Bld 124 (*)    All other components within normal limits  PROTIME-INR  APTT    EKG None  Radiology CT HEAD WO  CONTRAST  Result Date: 02/10/2020 CLINICAL DATA:  Altered mental status. EXAM: CT HEAD WITHOUT CONTRAST TECHNIQUE: Contiguous axial images were obtained from the base of the skull through the vertex without intravenous contrast. COMPARISON:  CT head dated 02/05/2017. FINDINGS: Brain: No evidence of acute infarction, hemorrhage, hydrocephalus, extra-axial collection or mass lesion/mass effect. There is moderate cerebral volume loss with associated ex vacuo dilatation. Periventricular white matter hypoattenuation likely represents chronic small vessel ischemic disease. Vascular: There are vascular calcifications in the carotid siphons. Skull: Normal. Negative for fracture or focal lesion. Sinuses/Orbits: No acute finding. Other: None. IMPRESSION: No acute intracranial process. Electronically Signed   By: Zerita Boers M.D.   On: 02/10/2020 13:46    Procedures Procedures   Medications Ordered in ED Medications  sodium chloride flush (NS) 0.9 % injection 3 mL (has no administration in time range)    ED Course  I have reviewed the triage vital signs and the nursing notes.  Pertinent labs & imaging results that were available during my care of the patient were reviewed by me and considered in my medical decision making (see chart for details).    MDM Rules/Calculators/A&P                          [facility] from [facility] [place] [facility] loss.  She has chronic  vision loss in the right eye which she reports is unchanged from baseline.  Per review of medical record and most recent visit with retina specialist, Dr. Coralyn Pear, in April 2021, she has minimal vision in the right eye at baseline with diagnosis of central retinal vein occlusion with macular edema of the right eye, retinal edema of both eyes, nonexudative age-related macular degeneration of both eyes, hypertensive retinopathy of both eyes.  It is reported she has "low vision" in the right eye.   It is reported that she may have  described a brief episode lasting a few minutes in duration of bilateral vision loss, however vision returned and she is at her baseline vision in both eyes.  She is in no acute distress, appropriate, calm, cooperative on examination.  She is able to provide history.  No findings suggestive of acute stroke.  CT head was obtained in triage and is negative.  Blood work is also unremarkable.  Discussed with attending physician Dr. Almyra Free.  At this time do not believe any additional work-up is indicated.  Did consider possibility of TIA, however her symptoms have resolved and vision is restored in the left eye.  She also is noted to be intermittently confused here though this seems similar to recent documentation by PCP.  She is noted to be somewhat unsteady on her feet here, however it is noted she ambulates with a walker at baseline and mentions this as well.  At this time patient is stable for discharge back to her assisted living facility.  Patient evaluated by Dr. Almyra Free, who agrees with care plan for discharge.  Final Clinical Impression(s) / ED Diagnoses Final diagnoses:  Vision loss of right eye    Rx / DC Orders ED Discharge Orders    None       Renda Pohlman, [place] N, PA-C 02/10/20 1744    Luna Fuse, MD 02/10/20 [phone]

## 2020-02-10 NOTE — ED Notes (Signed)
Report has been called [facility] to [facility] [place] [facility] been called  Needs a sitter we cannot keep her in the bed she is a potential fall staff is with her

## 2020-02-10 NOTE — ED Notes (Signed)
The pt is confused and disoriented she [facility]  [facility] resides there.   The pt has dressed and keeps coming into the hallway unsteady gait

## 2020-02-11 ENCOUNTER — Other Ambulatory Visit: Payer: Self-pay

## 2020-02-11 ENCOUNTER — Telehealth: Payer: Self-pay | Admitting: *Deleted

## 2020-02-11 DIAGNOSIS — R41841 Cognitive communication deficit: Secondary | ICD-10-CM | POA: Diagnosis not present

## 2020-02-11 MED ORDER — TRAMADOL HCL 50 MG PO TABS: 50.0000 mg | ORAL_TABLET | Freq: Two times a day (BID) | ORAL | 0 refills | Status: DC

## 2020-02-11 NOTE — Telephone Encounter (Signed)
Transition Care Management Unsuccessful Follow-up Telephone Call  Date of discharge and from where:  02/10/2020 [name]  Attempts:  1st Attempt  Reason for unsuccessful TCM follow-up call:  No answer/busy

## 2020-02-11 NOTE — Telephone Encounter (Signed)
Refill request received from [facility] pharmacy for Tramadol 50 mg tablet one twice a day. Medication pended and sent to Dr. Lyndel Safe for approval.

## 2020-02-12 ENCOUNTER — Non-Acute Institutional Stay: Payer: Medicare Other | Admitting: Nurse Practitioner

## 2020-02-12 ENCOUNTER — Encounter: Payer: Self-pay | Admitting: Nurse Practitioner

## 2020-02-12 DIAGNOSIS — R490 Dysphonia: Secondary | ICD-10-CM | POA: Diagnosis not present

## 2020-02-12 DIAGNOSIS — R260 Ataxic gait: Secondary | ICD-10-CM | POA: Diagnosis not present

## 2020-02-12 DIAGNOSIS — I6523 Occlusion and stenosis of bilateral carotid arteries: Secondary | ICD-10-CM | POA: Diagnosis not present

## 2020-02-12 DIAGNOSIS — K5900 Constipation, unspecified: Secondary | ICD-10-CM | POA: Diagnosis not present

## 2020-02-12 DIAGNOSIS — M159 Polyosteoarthritis, unspecified: Secondary | ICD-10-CM | POA: Diagnosis not present

## 2020-02-12 DIAGNOSIS — H53133 Sudden visual loss, bilateral: Secondary | ICD-10-CM

## 2020-02-12 DIAGNOSIS — M6281 Muscle weakness (generalized): Secondary | ICD-10-CM | POA: Diagnosis not present

## 2020-02-12 DIAGNOSIS — R1310 Dysphagia, unspecified: Secondary | ICD-10-CM | POA: Diagnosis not present

## 2020-02-12 DIAGNOSIS — N1831 Chronic kidney disease, stage 3a: Secondary | ICD-10-CM

## 2020-02-12 DIAGNOSIS — G629 Polyneuropathy, unspecified: Secondary | ICD-10-CM | POA: Diagnosis not present

## 2020-02-12 DIAGNOSIS — F0150 Vascular dementia without behavioral disturbance: Secondary | ICD-10-CM | POA: Diagnosis not present

## 2020-02-12 DIAGNOSIS — I4891 Unspecified atrial fibrillation: Secondary | ICD-10-CM | POA: Diagnosis not present

## 2020-02-12 DIAGNOSIS — R2681 Unsteadiness on feet: Secondary | ICD-10-CM | POA: Diagnosis not present

## 2020-02-12 DIAGNOSIS — N183 Chronic kidney disease, stage 3 unspecified: Secondary | ICD-10-CM | POA: Insufficient documentation

## 2020-02-12 NOTE — Progress Notes (Signed)
Location:   [facility] Room Number: 1 Place of Service:  ALF (13) Provider: Lennie Odor Shyane Fossum NP  [name], MD  Patient Care Team: [name], MD as PCP - General (Internal Medicine) Angelia Mould, MD as Attending Physician (Vascular Surgery) [name], MD (Inactive) as Attending Physician (Obstetrics and Gynecology) [name], MD (Orthopedic Surgery) [name], MD (Ophthalmology) [name], MD as Consulting Physician (Urology) [name], MD as Consulting Physician (Cardiology) [name], PA-C as Physician Assistant (Otolaryngology) [name], MD as Consulting Physician (Otolaryngology) [name], MD as Consulting Physician (Dermatology) [name], MD as Consulting Physician (Physical Medicine and Rehabilitation) [name], MD as Attending Physician (Gastroenterology) Gustavus Haskin X, NP as Nurse Practitioner (Internal Medicine) Kathrynn Ducking, MD as Consulting Physician (Neurology) Debara Pickett Nadean Corwin, MD as Consulting Physician (Cardiology) Druscilla Brownie, MD as Consulting Physician (Dermatology) [name], [name], NP as Nurse Practitioner (Family Medicine)  Extended Emergency Contact Information Primary Emergency Contact: [name] of [place] Phone: [phone] Mobile Phone: [phone] Relation: Son Secondary Emergency Contact: [name] Mobile Phone: [phone] Relation: Friend  Code Status:  DNR Goals of care: Advanced Directive information Advanced Directives 02/10/2020  Does Patient Have a Medical Advance Directive? No  Type of Advance Directive -  Does patient want to make changes to medical advance directive? No - Patient declined  Copy of [place] in Chart? -  Would patient like information on creating a medical advance directive? No - Patient declined     Chief Complaint  Patient presents with  . Acute Visit    f/u ED eval for vision loss     HPI:  Pt is a 87 y.o. female seen today for an acute visit for f/u ED eval for loss vision lasted or a few minutes prior ED1/31/22. Her vision has returned to her baseline of low vision of R+L R>L minimal vision of the right eye. S/p intravitreal inj for retinal vein occlusion with edema. Hx of bilateral macular degeneration, hypertensive retinopathy.  CT head 02/10/20 chronic small vessel ischemic disease, no acute intracranial process. EKG  AFib.   Peripheral neuropathy, takes Lyrica Dysphagia, s/p dilatation food impaction, f/u GI, takes Famotidine 69m qd Afib, takes Atenolol 12.525mbid. ASA 8131md. EKG 02/11/20 Afib Constipation, takes IB Gard, MiraLax prn, Senokot S II qd             Dysphonia, chronic, ENT             OA, takes Tylenol 500m32md, prn/bidTramadol.   CKD Bun/creat 20/1.06, eGFR 47 02/10/20  Past Medical History:  Diagnosis Date  . Abdominal bloating   . Atrial fibrillation (HCC)[place]. Bruises easily   . Carotid artery occlusion    LEFT  . Cricopharyngeal dysphagia   . Dizziness   . DOE (dyspnea on exertion) 04/02/2014  . Fall at home Sept 2013, Dec. 2013  Jun 08, 2012  . GERD (gastroesophageal reflux disease) 02/11/2015  . Headache(784.0)   . Hoarseness   . Hypercholesterolemia   . Hyperglycemia 04/02/2014   Glucose 178 mg percent on 01/22/2014 04/05/14 Hgb A1c 6.6 Diet controlled.    . Hypertensive retinopathy    OU  . Hypothyroidism 04/15/2015  . Neuropathy    PERIPHERAL  . Pruritus   . Scoliosis   . Varicose veins   . Zenker's diverticulum    Past Surgical History:  Procedure Laterality Date  . ABDOMINAL HYSTERECTOMY  1954  .  CATARACT EXTRACTION Bilateral   . CHOLECYSTECTOMY  1997  . ESOPHAGOGASTRODUODENOSCOPY N/A 03/30/2019   Procedure: ESOPHAGOGASTRODUODENOSCOPY (EGD);  Surgeon: Jerene Bears, MD;  Location: [facility];  Service: Gastroenterology;  Laterality: N/A;  . ESOPHAGOGASTRODUODENOSCOPY (EGD) WITH  PROPOFOL N/A 11/25/2016   Procedure: ESOPHAGOGASTRODUODENOSCOPY (EGD) WITH PROPOFOL;  Surgeon: [name], MD;  Location: [facility];  Service: Endoscopy;  Laterality: N/A;  . EYE SURGERY Bilateral    Cat Sx  . SPINE SURGERY  1997   correct scoliosis    No Known Allergies  Allergies as of 02/12/2020   No Known Allergies     Medication List       Accurate as of February 12, 2020  4:17 PM. If you have any questions, ask your nurse or doctor.        STOP taking these medications   [place] by: Marl Seago X Shawnelle Spoerl, NP     TAKE these medications   acetaminophen 500 MG tablet Commonly known as: TYLENOL Take 500 mg by mouth 2 (two) times daily.   acetaminophen 325 MG tablet Commonly known as: TYLENOL Take 650 mg by mouth every 8 (eight) hours as needed.   aspirin 81 MG chewable tablet Chew 81 mg by mouth daily.   atenolol 25 MG tablet Commonly known as: TENORMIN Take 12.5 mg by mouth 2 (two) times daily. Hold if SBP < 100 or HR < 60   benzocaine-menthol 6-10 MG lozenge Commonly known as: CHLORAEPTIC Take 1 lozenge by mouth every 2 (two) hours as needed for sore throat.   Biotin 5000 MCG Tabs Take 5,000 mcg by mouth daily.   famotidine 20 MG tablet Commonly known as: PEPCID Take 20 mg by mouth daily.   fluticasone 50 MCG/ACT nasal spray Commonly known as: FLONASE Place 2 sprays into both nostrils daily.   hydrogen peroxide 1.5 % Soln Use 15 ml orally, swish and spit as needed for dry mouth.   IBgard 90 MG Cpcr Generic drug: Peppermint Oil Take 2 capsules by mouth 2 (two) times daily.   ipratropium 0.06 % nasal spray Commonly known as: ATROVENT Place 2 sprays into both nostrils 2 (two) times daily as needed for rhinitis.   lactose free nutrition Liqd Take 237 mLs by mouth 2 (two) times daily with a meal. Per request of resident, states can't eat if takes it later   lidocaine-prilocaine cream Commonly known as: EMLA Apply 1  application topically daily as needed.   loratadine 10 MG tablet Commonly known as: CLARITIN Take 10 mg by mouth daily as needed for allergies.   Menthol (Topical Analgesic) 4 % Gel Apply 1 application topically every 8 (eight) hours. Apply to LLE   ondansetron 4 MG tablet Commonly known as: ZOFRAN Take 4 mg by mouth every 6 (six) hours as needed for nausea or vomiting.   polyethylene glycol 17 g packet Commonly known as: MIRALAX / GLYCOLAX Take 8.5-17 g by mouth See admin instructions. 1/2 dose QAM scheduled, 1 dose qd prn   pregabalin 50 MG capsule Commonly known as: LYRICA Take 25 mg by mouth at bedtime.   pregabalin 25 MG capsule Commonly known as: LYRICA Take 1 capsule (25 mg total) by mouth in the morning.   SalivaMAX Pack Use as directed 1 Package in the mouth or throat. mix with 59m's of H20; swish and spit BID; may keep in bathroom; may self administer.   sennosides-docusate sodium 8.6-50 MG tablet Commonly known as: SENOKOT-S Take 2 tablets by mouth  daily.   sodium phosphate 7-19 GM/118ML Enem Place 1 enema rectally as needed for severe constipation. Give Fleet enema per rectum X 1 after removing hard stool or if soft stool is present in rectum.   Systane Balance 0.6 % Soln Generic drug: Propylene Glycol Give 1 drop to both eyes once daily as needed for dryness   traMADol 50 MG tablet Commonly known as: ULTRAM Take 50 mg by mouth daily as needed.   traMADol 50 MG tablet Commonly known as: ULTRAM Take 1 tablet (50 mg total) by mouth 2 (two) times daily.   triamcinolone 0.1 % Commonly known as: KENALOG Apply 1 application topically as needed. Apply to itchy areas as needed. May keep in room and self apply   VIACTIV PO Take 1 tablet by mouth daily.   Vitamin B-2 25 MG Tabs Take 1 tablet by mouth daily.       Review of Systems  Constitutional: Negative for appetite change, fatigue and fever.  HENT: Positive for hearing loss and trouble swallowing.  Negative for congestion, mouth sores, sore throat and voice change.   Eyes: Positive for visual disturbance. Negative for pain.       Minimal vision of the right eye  Respiratory: Negative for cough and shortness of breath.   Cardiovascular: Negative for leg swelling.  Gastrointestinal: Negative for abdominal pain, constipation, nausea and vomiting.  Genitourinary: Negative for dysuria, hematuria and urgency.       New onset urinary incontinence.   Musculoskeletal: Positive for arthralgias, back pain and gait problem.  Skin: Negative for color change.  Neurological: Negative for facial asymmetry, speech difficulty, weakness, light-headedness and headaches.  Psychiatric/Behavioral: Negative for behavioral problems and sleep disturbance. The patient is not nervous/anxious.     Immunization History  Administered Date(s) Administered  . Influenza Split 10/20/2011, 11/03/2012  . Influenza, High Dose Seasonal PF 10/24/2018  . Influenza,inj,Quad PF,6+ Mos 09/29/2012, 11/09/2013  . Influenza-Unspecified 10/17/2014, 10/23/2015, 10/20/2016, 10/17/2017, 10/16/2019  . Moderna Sars-Covid-2 Vaccination 01/15/2019, 02/12/2019, 11/26/2019  . PPD Test 08/21/2013, 03/04/2014  . Pneumococcal Conjugate-13 08/21/2013  . Pneumococcal Polysaccharide-23 09/08/2016  . Tdap 05/20/2008  . Tetanus 11/02/2018  . Zoster 11/01/2012   Pertinent  Health Maintenance Due  Topic Date Due  . FOOT EXAM  Never done  . OPHTHALMOLOGY EXAM  Never done  . URINE MICROALBUMIN  Never done  . HEMOGLOBIN A1C  06/28/2019  . INFLUENZA VACCINE  Completed  . DEXA SCAN  Completed  . PNA vac Low Risk Adult  Completed   Fall Risk  11/04/2017 09/05/2017 09/22/2016 08/25/2016 05/31/2016  Falls in the past year? Yes Yes Yes Yes No  Number falls in past yr: 2 or more 2 or more 1 2 or more -  Comment - - - - -  Injury with Fall? Yes Yes Yes Yes -  Comment - - fx 3 ribs - -  Risk Factor Category  - - - - -  Risk for fall due to : - -  Impaired mobility;Impaired balance/gait - -  Risk for fall due to: Comment - - - - -   Functional Status Survey:    Vitals:   02/12/20 1145  BP: 124/72  Pulse: 76  Resp: 18  Temp: 97.7 F (36.5 C)  SpO2: 94%  Weight: 86 lb 9.6 oz (39.3 kg)  Height: 5\' 2"  (1.575 m)   Body mass index is 15.84 kg/m. Physical Exam Vitals and nursing note reviewed.  Constitutional:      Appearance: Normal  appearance.  HENT:     Head: Normocephalic and atraumatic.     Nose: Nose normal. No congestion or rhinorrhea.     Mouth/Throat:     Mouth: Mucous membranes are moist.  Eyes:     General: Vision grossly intact. Gaze aligned appropriately. No allergic shiner or visual field deficit.       Right eye: No foreign body or hordeolum.        Left eye: No foreign body or hordeolum.     Extraocular Movements: Extraocular movements intact.     Conjunctiva/sclera: Conjunctivae normal.     Right eye: Right conjunctiva is not injected. No exudate or hemorrhage.    Left eye: Left conjunctiva is not injected. No exudate or hemorrhage.    Pupils: Pupils are equal, round, and reactive to light.  Cardiovascular:     Rate and Rhythm: Normal rate. Rhythm irregular.     Heart sounds: No murmur heard.   Pulmonary:     Effort: Pulmonary effort is normal.     Breath sounds: No rales.  Abdominal:     General: Bowel sounds are normal.     Palpations: Abdomen is soft.     Tenderness: There is no abdominal tenderness.  Musculoskeletal:     Cervical back: Normal range of motion and neck supple.     Right lower leg: No edema.     Left lower leg: No edema.  Skin:    General: Skin is warm and dry.  Neurological:     General: No focal deficit present.     Mental Status: She is alert. Mental status is at baseline.     Gait: Gait abnormal.     Comments: Oriented to person, place.   Psychiatric:        Mood and Affect: Mood normal.        Behavior: Behavior normal.        Thought Content: Thought content  normal.     Labs reviewed: Recent Labs    09/03/19 0000 01/21/20 0000 02/10/20 1329 02/10/20 1330  NA 139 141 138 137  K 4.7 4.8 4.3 4.2  CL 103 104 102 99  CO2 30* 30*  --  28  GLUCOSE  --   --  124* 127*  BUN 15 25* 25* 20  CREATININE 0.9 1.0 1.00 1.06*  CALCIUM 9.1 9.2  --  9.6   Recent Labs    09/03/19 0000 01/21/20 0000 02/10/20 1330  AST _0 ALT _1 ALKPHOS 62 69 62  BILITOT  --   --  0.7  PROT  --   --  6.7  ALBUMIN 3.3* 3.3* 3.6   Recent Labs    12/04/19 0000 01/21/20 0000 02/10/20 1329 02/10/20 1330  WBC 7.1 6.8  --  10.1  NEUTROABS 4,012.00 3,808.00  --  7.0  HGB 13.5 13.6 14.3 13.8  HCT 39 39 42.0 41.0  MCV  --   --   --  104.6*  PLT 153 135*  --  171   Lab Results  Component Value Date   TSH 2.68 09/03/2019   Lab Results  Component Value Date   HGBA1C 5.8 12/28/2018   Lab Results  Component Value Date   CHOL 187 11/03/2017   HDL 84 (A) 11/03/2017   LDLCALC 80 11/03/2017   TRIG 136 11/03/2017   CHOLHDL 2.1 07/29/2016    Significant Diagnostic Results in last 30 days:  CT HEAD WO CONTRAST  Result  Date: 02/10/2020 CLINICAL DATA:  Altered mental status. EXAM: CT HEAD WITHOUT CONTRAST TECHNIQUE: Contiguous axial images were obtained from the base of the skull through the vertex without intravenous contrast. COMPARISON:  CT head dated 02/05/2017. FINDINGS: Brain: No evidence of acute infarction, hemorrhage, hydrocephalus, extra-axial collection or mass lesion/mass effect. There is moderate cerebral volume loss with associated ex vacuo dilatation. Periventricular white matter hypoattenuation likely represents chronic small vessel ischemic disease. Vascular: There are vascular calcifications in the carotid siphons. Skull: Normal. Negative for fracture or focal lesion. Sinuses/Orbits: No acute finding. Other: None. IMPRESSION: No acute intracranial process. Electronically Signed   By: Zerita Boers M.D.   On: 02/10/2020 13:46     Assessment/Plan Loss, vision, sudden, bilateral  ED eval for loss vision lasted or a few minutes prior ED1/31/22. Her vision has returned to her baseline of low vision of R+L R>L minimal vision of the right eye. S/p intravitreal inj for retinal vein occlusion with edema. Hx of bilateral macular degeneration, hypertensive retinopathy.  CT head 02/10/20 chronic small vessel ischemic disease, no acute intracranial process. ? TIA F/u ophthalmology Obtain Carotid R+L artery US(last done 2014).   Carotid stenosis 2016 US carotid artery: Left totally occluded. Right 40% occlusion Repeat US bilateral carotid artery.    Vascular dementia ([place]) CT head 02/10/20 chronic small vessel ischemic disease, no acute intracranial process.  Neuropathy Peripheral neuropathy, takes Lyrica   Dysphagia Dysphagia, s/p dilatation food impaction, f/u GI, takes Famotidine 15m qd   Atrial fibrillation with RVR (HCC) Afib, takes Atenolol 12.514mbid. ASA 8144md. EKG 02/11/20 Afib   Constipation Constipation, takes IB Gard, MiraLax prn, Senokot S II qd   Dysphonia Dysphonia, chronic, ENT   Osteoarthritis OA, takes Tylenol 500m71md, prn/bidTramadol.   CKD (chronic kidney disease) stage 3, GFR 30-59 ml/min (HCC) CKD Bun/creat 20/1.06, eGFR 47 02/10/20     Family/ staff Communication: plan of care reviewed with the patient and charge nurse.   Labs/tests ordered:  Carotid US [place]   Time spend 40 minutes.

## 2020-02-12 NOTE — Assessment & Plan Note (Signed)
Constipation, takes IB Gard, MiraLax prn, Senokot S II qd  

## 2020-02-12 NOTE — Telephone Encounter (Signed)
Transition Care Management Follow-up Telephone Call  Date of discharge and from where: 02/10/2020 - [name]  How have you been since you were released from the hospital? "I am okay"  Any questions or concerns? No  Items Reviewed:  Did the pt receive and understand the discharge instructions provided? Yes   Medications obtained and verified? Yes   Other? N/A  Any new allergies since your discharge? No   Dietary orders reviewed? Yes  Do you have support at home? Yes   Home Care and Equipment/Supplies: Were home health services ordered? not applicable If so, what is the name of the agency? N/A  Has the agency set up a time to come to the patient's home? not applicable Were any new equipment or medical supplies ordered?  No What is the name of the medical supply agency? N/A Were you able to get the supplies/equipment? not applicable Do you have any questions related to the use of the equipment or supplies? No  Functional Questionnaire: (I = Independent and D = Dependent) ADLs: I  Bathing/Dressing- I  Meal Prep- I  Eating- I  Maintaining continence- I  Transferring/Ambulation- D  Managing Meds- D  Follow up appointments reviewed:   PCP Hospital f/u appt confirmed? No  - Patient will call to make appointment if necessary.   [facility] f/u appt confirmed? N/A  Are transportation arrangements needed? N/A  If their condition worsens, is the pt aware to call PCP or go to the Emergency Dept.? Yes  Was the patient provided with contact information for the PCP's office or ED? Yes  Was to pt encouraged to call back with questions or concerns? Yes

## 2020-02-12 NOTE — Assessment & Plan Note (Signed)
Dysphonia, chronic, ENT 

## 2020-02-12 NOTE — Assessment & Plan Note (Signed)
OA, takes Tylenol 500mg bid, prn/bid Tramadol.   

## 2020-02-12 NOTE — Assessment & Plan Note (Signed)
Afib, takes Atenolol 12.5mg  bid. ASA 81mg  qd. EKG 02/11/20 Afib

## 2020-02-12 NOTE — Assessment & Plan Note (Signed)
Dysphagia, s/p dilatation food impaction, f/u GI, takes Famotidine 20mg qd  

## 2020-02-12 NOTE — Assessment & Plan Note (Signed)
CT head 02/10/20 chronic small vessel ischemic disease, no acute intracranial process.  

## 2020-02-12 NOTE — Assessment & Plan Note (Signed)
Peripheral neuropathy, takes Lyrica

## 2020-02-12 NOTE — Assessment & Plan Note (Signed)
2016 US carotid artery: Left totally occluded. Right 40% occlusion Repeat US bilateral carotid artery.

## 2020-02-12 NOTE — Assessment & Plan Note (Signed)
CKD Bun/creat 20/1.06, eGFR 47 02/10/20

## 2020-02-12 NOTE — Assessment & Plan Note (Signed)
ED eval for loss vision lasted or a few minutes prior ED1/31/22. Her vision has returned to her baseline of low vision of R+L R>L minimal vision of the right eye. S/p intravitreal inj for retinal vein occlusion with edema. Hx of bilateral macular degeneration, hypertensive retinopathy.  CT head 02/10/20 chronic small vessel ischemic disease, no acute intracranial process. ? TIA F/u ophthalmology Obtain Carotid R+L artery US(last done 2014).

## 2020-02-13 DIAGNOSIS — R2681 Unsteadiness on feet: Secondary | ICD-10-CM | POA: Diagnosis not present

## 2020-02-13 DIAGNOSIS — R260 Ataxic gait: Secondary | ICD-10-CM | POA: Diagnosis not present

## 2020-02-13 DIAGNOSIS — M6281 Muscle weakness (generalized): Secondary | ICD-10-CM | POA: Diagnosis not present

## 2020-02-14 ENCOUNTER — Non-Acute Institutional Stay: Payer: Medicare Other | Admitting: Internal Medicine

## 2020-02-14 DIAGNOSIS — R4689 Other symptoms and signs involving appearance and behavior: Secondary | ICD-10-CM | POA: Diagnosis not present

## 2020-02-14 DIAGNOSIS — R4189 Other symptoms and signs involving cognitive functions and awareness: Secondary | ICD-10-CM

## 2020-02-14 DIAGNOSIS — R260 Ataxic gait: Secondary | ICD-10-CM | POA: Diagnosis not present

## 2020-02-14 DIAGNOSIS — F0150 Vascular dementia without behavioral disturbance: Secondary | ICD-10-CM | POA: Diagnosis not present

## 2020-02-14 DIAGNOSIS — R1310 Dysphagia, unspecified: Secondary | ICD-10-CM | POA: Diagnosis not present

## 2020-02-14 DIAGNOSIS — T07XXXA Unspecified multiple injuries, initial encounter: Secondary | ICD-10-CM

## 2020-02-14 DIAGNOSIS — I4891 Unspecified atrial fibrillation: Secondary | ICD-10-CM | POA: Diagnosis not present

## 2020-02-14 DIAGNOSIS — R0989 Other specified symptoms and signs involving the circulatory and respiratory systems: Secondary | ICD-10-CM | POA: Diagnosis not present

## 2020-02-14 DIAGNOSIS — I6523 Occlusion and stenosis of bilateral carotid arteries: Secondary | ICD-10-CM | POA: Diagnosis not present

## 2020-02-14 DIAGNOSIS — G629 Polyneuropathy, unspecified: Secondary | ICD-10-CM

## 2020-02-14 DIAGNOSIS — M6281 Muscle weakness (generalized): Secondary | ICD-10-CM | POA: Diagnosis not present

## 2020-02-14 DIAGNOSIS — R2681 Unsteadiness on feet: Secondary | ICD-10-CM | POA: Diagnosis not present

## 2020-02-15 ENCOUNTER — Encounter: Payer: Self-pay | Admitting: Nurse Practitioner

## 2020-02-15 ENCOUNTER — Other Ambulatory Visit: Payer: Self-pay | Admitting: *Deleted

## 2020-02-15 ENCOUNTER — Non-Acute Institutional Stay: Payer: Medicare Other | Admitting: Nurse Practitioner

## 2020-02-15 ENCOUNTER — Encounter: Payer: Self-pay | Admitting: Internal Medicine

## 2020-02-15 DIAGNOSIS — M6281 Muscle weakness (generalized): Secondary | ICD-10-CM | POA: Diagnosis not present

## 2020-02-15 DIAGNOSIS — I6523 Occlusion and stenosis of bilateral carotid arteries: Secondary | ICD-10-CM

## 2020-02-15 DIAGNOSIS — M545 Low back pain, unspecified: Secondary | ICD-10-CM

## 2020-02-15 DIAGNOSIS — R490 Dysphonia: Secondary | ICD-10-CM | POA: Diagnosis not present

## 2020-02-15 DIAGNOSIS — K5900 Constipation, unspecified: Secondary | ICD-10-CM

## 2020-02-15 DIAGNOSIS — F0150 Vascular dementia without behavioral disturbance: Secondary | ICD-10-CM

## 2020-02-15 DIAGNOSIS — G8929 Other chronic pain: Secondary | ICD-10-CM

## 2020-02-15 DIAGNOSIS — R2681 Unsteadiness on feet: Secondary | ICD-10-CM | POA: Diagnosis not present

## 2020-02-15 DIAGNOSIS — H53133 Sudden visual loss, bilateral: Secondary | ICD-10-CM | POA: Diagnosis not present

## 2020-02-15 DIAGNOSIS — I4891 Unspecified atrial fibrillation: Secondary | ICD-10-CM | POA: Diagnosis not present

## 2020-02-15 DIAGNOSIS — G629 Polyneuropathy, unspecified: Secondary | ICD-10-CM | POA: Diagnosis not present

## 2020-02-15 DIAGNOSIS — N1831 Chronic kidney disease, stage 3a: Secondary | ICD-10-CM

## 2020-02-15 DIAGNOSIS — R1310 Dysphagia, unspecified: Secondary | ICD-10-CM

## 2020-02-15 DIAGNOSIS — R260 Ataxic gait: Secondary | ICD-10-CM | POA: Diagnosis not present

## 2020-02-15 NOTE — Progress Notes (Signed)
Location: [place] of Service:  ALF (13)  Provider:   Code Status:  Goals of Care:  Advanced Directives 02/10/2020  Does Patient Have a Medical Advance Directive? No  Type of Advance Directive -  Does patient want to make changes to medical advance directive? No - Patient declined  Copy of [place] in Chart? -  Would patient like information on creating a medical advance directive? No - Patient declined     Chief Complaint  Patient presents with  . Acute Visit    HPI: Patient is a 87 y.o. female seen today for an acute visit for Bruises on both her arms  Patient has h/ochronic atrial fibrillation not on any coagulation, GERD, hypothyroidism, hyperlipidemia, neuropathy,,  cognitive impairment, H/o Syncope, h/o IBS and Esophageal Stenosiss/p Dilatation.  She also Has H/o Retinal Vein Occlusion with Edema S/P Vitreal Injections  Patient recently has been having worsening in her confusion and behaviors She was send to the ED on the weekend as she c/o sudden vision loss in both her eyes But during her transport with EMS patient got very agitated and started to hit the staff in ED  and trying to leave . She needed soft restrains and now has some Bruises in her arms Her son want her to be evaluated. Patient denies any pain or any discomfort No recent falls Her vision per patient is back to Normal.  Unable to get more history from her. She says she is fine  CT Scan in ED was negative for any stroke Just Chronic Small vessel disease    Past Medical History:  Diagnosis Date  . Abdominal bloating   . Atrial fibrillation ([place])   . Bruises easily   . Carotid artery occlusion    LEFT  . Cricopharyngeal dysphagia   . Dizziness   . DOE (dyspnea on exertion) 04/02/2014  . Fall at home Sept 2013, Dec. 2013  Jun 08, 2012  . GERD (gastroesophageal reflux disease) 02/11/2015  . Headache(784.0)   . Hoarseness   . Hypercholesterolemia   .  Hyperglycemia 04/02/2014   Glucose 178 mg percent on 01/22/2014 04/05/14 Hgb A1c 6.6 Diet controlled.    . Hypertensive retinopathy    OU  . Hypothyroidism 04/15/2015  . Neuropathy    PERIPHERAL  . Pruritus   . Scoliosis   . Varicose veins   . Zenker's diverticulum     Past Surgical History:  Procedure Laterality Date  . ABDOMINAL HYSTERECTOMY  1954  . CATARACT EXTRACTION Bilateral   . CHOLECYSTECTOMY  1997  . ESOPHAGOGASTRODUODENOSCOPY N/A 03/30/2019   Procedure: ESOPHAGOGASTRODUODENOSCOPY (EGD);  Surgeon: Jerene Bears, MD;  Location: [facility];  Service: Gastroenterology;  Laterality: N/A;  . ESOPHAGOGASTRODUODENOSCOPY (EGD) WITH PROPOFOL N/A 11/25/2016   Procedure: ESOPHAGOGASTRODUODENOSCOPY (EGD) WITH PROPOFOL;  Surgeon: [name], MD;  Location: [facility];  Service: Endoscopy;  Laterality: N/A;  . EYE SURGERY Bilateral    Cat Sx  . SPINE SURGERY  1997   correct scoliosis    No Known Allergies  Outpatient Encounter Medications as of 02/14/2020  Medication Sig  . acetaminophen (TYLENOL) 325 MG tablet Take 650 mg by mouth every 8 (eight) hours as needed.  Marland Kitchen acetaminophen (TYLENOL) 500 MG tablet Take 500 mg by mouth 2 (two) times daily.  . Artificial Saliva (SALIVAMAX) PACK Use as directed 1 Package in the mouth or throat. mix with 8mL's of H20; swish and spit BID; may keep in bathroom; may  self administer.  Marland Kitchen aspirin 81 MG chewable tablet Chew 81 mg by mouth daily.  Marland Kitchen atenolol (TENORMIN) 25 MG tablet Take 12.5 mg by mouth 2 (two) times daily. Hold if SBP < 100 or HR < 60  . benzocaine-menthol (CHLORAEPTIC) 6-10 MG lozenge Take 1 lozenge by mouth every 2 (two) hours as needed for sore throat.  . Biotin 5000 MCG TABS Take 5,000 mcg by mouth daily.  . Calcium-Vitamin D-Vitamin K (VIACTIV PO) Take 1 tablet by mouth daily.  . famotidine (PEPCID) 20 MG tablet Take 20 mg by mouth daily.  . fluticasone (FLONASE) 50 MCG/ACT nasal spray Place 2 sprays into both nostrils  daily.  . hydrogen peroxide 1.5 % SOLN Use 15 ml orally, swish and spit as needed for dry mouth.  Marland Kitchen ipratropium (ATROVENT) 0.06 % nasal spray Place 2 sprays into both nostrils 2 (two) times daily as needed for rhinitis.  Marland Kitchen lactose free nutrition (BOOST PLUS) LIQD Take 237 mLs by mouth 2 (two) times daily with a meal. Per request of resident, states can't eat if takes it later  . lidocaine-prilocaine (EMLA) cream Apply 1 application topically daily as needed.  . loratadine (CLARITIN) 10 MG tablet Take 10 mg by mouth daily as needed for allergies.  . Menthol, Topical Analgesic, 4 % GEL Apply 1 application topically every 8 (eight) hours. Apply to LLE  . ondansetron (ZOFRAN) 4 MG tablet Take 4 mg by mouth every 6 (six) hours as needed for nausea or vomiting.  Marland Kitchen Peppermint Oil (IBGARD) 90 MG CPCR Take 2 capsules by mouth 2 (two) times daily.   . polyethylene glycol (MIRALAX / GLYCOLAX) packet Take 8.5-17 g by mouth See admin instructions. 1/2 dose QAM scheduled, 1 dose qd prn  . pregabalin (LYRICA) 25 MG capsule Take 1 capsule (25 mg total) by mouth in the morning.  . pregabalin (LYRICA) 50 MG capsule Take 25 mg by mouth at bedtime.  Marland Kitchen Propylene Glycol (SYSTANE BALANCE) 0.6 % SOLN Give 1 drop to both eyes once daily as needed for dryness  . Riboflavin (VITAMIN B-2) 25 MG TABS Take 1 tablet by mouth daily.  . sennosides-docusate sodium (SENOKOT-S) 8.6-50 MG tablet Take 2 tablets by mouth daily.   . sodium phosphate (FLEET) 7-19 GM/118ML ENEM Place 1 enema rectally as needed for severe constipation. Give Fleet enema per rectum X 1 after removing hard stool or if soft stool is present in rectum.  . traMADol (ULTRAM) 50 MG tablet Take 50 mg by mouth daily as needed.   . traMADol (ULTRAM) 50 MG tablet Take 1 tablet (50 mg total) by mouth 2 (two) times daily.  Marland Kitchen triamcinolone cream (KENALOG) 0.1 % Apply 1 application topically as needed. Apply to itchy areas as needed. May keep in room and self apply   No  facility-administered encounter medications on file as of 02/14/2020.    Review of Systems:  Review of Systems  Review of Systems  Constitutional: Negative for activity change, appetite change, chills, diaphoresis, fatigue and fever.  HENT: Negative for mouth sores, postnasal drip, rhinorrhea, sinus pain and sore throat.   Respiratory: Negative for apnea, cough, chest tightness, shortness of breath and wheezing.   Cardiovascular: Negative for chest pain, palpitations and leg swelling.  Gastrointestinal: Negative for abdominal distention, abdominal pain, constipation, diarrhea, nausea and vomiting.  Genitourinary: Negative for dysuria and frequency.  Musculoskeletal: Negative for arthralgias, joint swelling and myalgias.  Skin: Negative for rash. Positive for Bruise Neurological: Negative for dizziness, syncope, weakness, light-headedness  and numbness.  Psychiatric/Behavioral: Positive  for behavioral problems, confusion   Health Maintenance  Topic Date Due  . FOOT EXAM  Never done  . OPHTHALMOLOGY EXAM  Never done  . URINE MICROALBUMIN  Never done  . HEMOGLOBIN A1C  06/28/2019  . COVID-19 Vaccine (4 - Booster for Moderna series) 05/25/2020  . TETANUS/TDAP  11/01/2028  . INFLUENZA VACCINE  Completed  . DEXA SCAN  Completed  . PNA vac Low Risk Adult  Completed    Physical Exam: Vitals:   02/15/20 0921  BP: (!) 144/71  Pulse: 77  Resp: 20  Temp: (!) 97.1 F (36.2 C)   There is no height or weight on file to calculate BMI. Physical Exam  Constitutional: . Well-developed but frail HENT:  Head: Normocephalic.  Mouth/Throat: Oropharynx is clear and moist.  Eyes: Pupils are equal, round, and reactive to light.  Neck: Neck supple.  Cardiovascular: Normal rate and normal heart sounds.  No murmur heard. Pulmonary/Chest: Effort normal and breath sounds normal. No respiratory distress. No wheezes. She has no rales.  Abdominal: Soft. Bowel sounds are normal. No distension. There is  no tenderness. There is no rebound.  Musculoskeletal: No edema.  Lymphadenopathy: none Neurological: Alert and oriented to person, place, and time.  Skin: Skin is warm and dry. Has Bruises in both her arms. But no redness or signs of infection Psychiatric: Normal mood and affect. Behavior is normal. Thought content normal.   Labs reviewed: Basic Metabolic Panel: Recent Labs    09/03/19 0000 01/21/20 0000 02/10/20 1329 02/10/20 1330  NA 139 141 138 137  K 4.7 4.8 4.3 4.2  CL 103 104 102 99  CO2 30* 30*  --  28  GLUCOSE  --   --  124* 127*  BUN 15 25* 25* 20  CREATININE 0.9 1.0 1.00 1.06*  CALCIUM 9.1 9.2  --  9.6  TSH 2.68  --   --   --    Liver Function Tests: Recent Labs    09/03/19 0000 01/21/20 0000 02/10/20 1330  AST 20 20 27   ALT 11 12 19   ALKPHOS 62 69 62  BILITOT  --   --  0.7  PROT  --   --  6.7  ALBUMIN 3.3* 3.3* 3.6   No results for input(s): LIPASE, AMYLASE in the last 8760 hours. No results for input(s): AMMONIA in the last 8760 hours. CBC: Recent Labs    12/04/19 0000 01/21/20 0000 02/10/20 1329 02/10/20 1330  WBC 7.1 6.8  --  10.1  NEUTROABS 4,012.00 3,808.00  --  7.0  HGB 13.5 13.6 14.3 13.8  HCT 39 39 42.0 41.0  MCV  --   --   --  104.6*  PLT 153 135*  --  171   Lipid Panel: No results for input(s): CHOL, HDL, LDLCALC, TRIG, CHOLHDL, LDLDIRECT in the last 8760 hours. Lab Results  Component Value Date   HGBA1C 5.8 12/28/2018    Procedures since last visit: CT HEAD WO CONTRAST  Result Date: 02/10/2020 CLINICAL DATA:  Altered mental status. EXAM: CT HEAD WITHOUT CONTRAST TECHNIQUE: Contiguous axial images were obtained from the base of the skull through the vertex without intravenous contrast. COMPARISON:  CT head dated 02/05/2017. FINDINGS: Brain: No evidence of acute infarction, hemorrhage, hydrocephalus, extra-axial collection or mass lesion/mass effect. There is moderate cerebral volume loss with associated ex vacuo dilatation.  Periventricular white matter hypoattenuation likely represents chronic small vessel ischemic disease. Vascular: There are vascular calcifications in the  carotid siphons. Skull: Normal. Negative for fracture or focal lesion. Sinuses/Orbits: No acute finding. Other: None. IMPRESSION: No acute intracranial process. Electronically Signed   By: Zerita Boers M.D.   On: 02/10/2020 13:46    Assessment/Plan 1. Multiple bruises Reassure Family Will continue to monitor  2.? Transient Vision loss Will have her follow ophthalmologist On Aspirin CT scan of head was negative Says her vision is at baseline now Has h/o Central Vein Occlusion in right eye with hypertensive retinopathy of both eyes and Macular degeneration  3. Vascular dementia without behavioral disturbance (HCC) D/w the family to move her to higher level of care She has had cognitive decline and will need more supervision Family want therapy eval first 4. Neuropathy On Lyrica  5. Dysphagia, unspecified type S/p Dilatation Doing well  6. Atrial fibrillation with RVR (HCC) Only on aspirin and Lopressor Not candidate for Anticoagulation due to ag and frail     Labs/tests ordered:  * No order type specified * Next appt:  Visit date not found

## 2020-02-15 NOTE — Assessment & Plan Note (Signed)
takes Lyrica 

## 2020-02-15 NOTE — Assessment & Plan Note (Signed)
chronic, ENT  

## 2020-02-15 NOTE — Assessment & Plan Note (Signed)
Loss, vision, sudden, bilateral resolved after a few minutes w/o intervention, baseline minimal vision of the right eye. S/p intravitreal inj for retinal vein occlusion with edema. Hx of bilateral macular degeneration, hypertensive retinopathy.  CT head 02/10/20 chronic small vessel ischemic disease, no acute intracranial process. ? TIA F/u ophthalmology 02/15/20 Carotid R+L artery US(last done 2014) showed no significnt stenosis of the internal carotid artery.

## 2020-02-15 NOTE — Assessment & Plan Note (Signed)
takes Atenolol 12.5mg bid. ASA 81mg qd.EKG 02/11/20 Afib  

## 2020-02-15 NOTE — Assessment & Plan Note (Signed)
s/p dilatation food impaction, f/u GI, takes Famotidine 20mg  qd

## 2020-02-15 NOTE — Assessment & Plan Note (Signed)
CT head 02/10/20 chronic small vessel ischemic disease, no acute intracranial process.  

## 2020-02-15 NOTE — Assessment & Plan Note (Signed)
takes IB Gard, MiraLax prn, Senokot S II qd  

## 2020-02-15 NOTE — Assessment & Plan Note (Signed)
2016 US carotid artery: Left totally occluded. Right 40% occlusion. 02/15/20 Carotid R+L artery US(last done 2014) showed no significnt stenosis of the internal carotid artery.  

## 2020-02-15 NOTE — Assessment & Plan Note (Signed)
Bun/creat 20/1.06, eGFR 47 02/10/20

## 2020-02-15 NOTE — Assessment & Plan Note (Signed)
takes Tylenol 500mg bid, prn/bidTramadol.  

## 2020-02-15 NOTE — Progress Notes (Signed)
Location:    [facility] Room Number: 1 Place of Service:  ALF ([phone]) Provider:  Marda Stalker, Lennie Odor NP  [name], MD  Patient Care Team: [name], MD as PCP - General (Internal Medicine) Angelia Mould, MD as Attending Physician (Vascular Surgery) [name], MD (Inactive) as Attending Physician (Obstetrics and Gynecology) [name], MD (Orthopedic Surgery) [name], MD (Ophthalmology) [name], MD as Consulting Physician (Urology) [name], MD as Consulting Physician (Cardiology) [name], PA-C as Physician Assistant (Otolaryngology) [name], MD as Consulting Physician (Otolaryngology) [name], MD as Consulting Physician (Dermatology) [name], MD as Consulting Physician (Physical Medicine and Rehabilitation) [name], MD as Attending Physician (Gastroenterology) Jakyia Gaccione X, NP as Nurse Practitioner (Internal Medicine) Kathrynn Ducking, MD as Consulting Physician (Neurology) Debara Pickett Nadean Corwin, MD as Consulting Physician (Cardiology) Druscilla Brownie, MD as Consulting Physician (Dermatology) [name], [name], NP as Nurse Practitioner (Family Medicine)  Extended Emergency Contact Information Primary Emergency Contact: [name] of [place] Phone: [phone] Mobile Phone: [phone] Relation: Son Secondary Emergency Contact: [name] Mobile Phone: [phone] Relation: Friend  Code Status:  Full code Goals of care: Advanced Directive information Advanced Directives 02/10/2020  Does Patient Have a Medical Advance Directive? No  Type of Advance Directive -  Does patient want to make changes to medical advance directive? No - Patient declined  Copy of [place] in Chart? -  Would patient like information on creating a medical advance directive? No - Patient declined     Chief Complaint  Patient presents with  . Medical Management of  Chronic Issues  . Health Maintenance    Foot and eye exam, urine microalbumin, hemoglobin A1C    HPI:  Pt is a 86 y.o. female seen today for medical management of chronic diseases.      Loss, vision, sudden, bilateral resolved after a few minutes w/o intervention, baseline minimal vision of the right eye. S/p intravitreal inj for retinal vein occlusion with edema. Hx of bilateral macular degeneration, hypertensive retinopathy.  CT head 02/10/20 chronic small vessel ischemic disease, no acute intracranial process. ? TIA F/u ophthalmology 02/15/20 Carotid R+L artery US(last done 2014) showed no significnt stenosis of the internal carotid artery.   Carotid stenosis 2016 US carotid artery: Left totally occluded. Right 40% occlusion. 02/15/20 Carotid R+L artery US(last done 2014) showed no significnt stenosis of the internal carotid artery.   Vascular dementia ([place]) CT head 02/10/20 chronic small vessel ischemic disease, no acute intracranial process.  Neuropathy, takesLyrica  Dysphagia, s/p dilatation food impaction, f/u GI, takes Famotidine 21m qd  Atrial fibrillation with RVR ([place], takes Atenolol 12.568mbid. ASA 8161md.EKG 02/11/20 Afib  Constipation, takes IB Gard, MiraLax prn, Senokot S II qd  Dysphonia,  chronic, ENT  Osteoarthritis, takes Tylenol 500m81md, prn/bidTramadol.   CKD (chronic kidney disease) stage 3, GFR 30-59 ml/min (HCC), Bun/creat 20/1.06, eGFR 47 02/10/20   Past Medical History:  Diagnosis Date  . Abdominal bloating   . Atrial fibrillation (HCC)[place]. Bruises easily   . Carotid artery occlusion    LEFT  . Cricopharyngeal dysphagia   . Dizziness   . DOE (dyspnea on exertion) 04/02/2014  . Fall at home Sept 2013, Dec. 2013  Jun 08, 2012  . GERD (gastroesophageal reflux disease) 02/11/2015  . Headache(784.0)   . Hoarseness   . Hypercholesterolemia   . Hyperglycemia 04/02/2014   Glucose 178 mg percent on 01/22/2014  04/05/14 Hgb A1c 6.6 Diet controlled.    .  Hypertensive retinopathy    OU  . Hypothyroidism 04/15/2015  . Neuropathy    PERIPHERAL  . Pruritus   . Scoliosis   . Varicose veins   . Zenker's diverticulum    Past Surgical History:  Procedure Laterality Date  . ABDOMINAL HYSTERECTOMY  1954  . CATARACT EXTRACTION Bilateral   . CHOLECYSTECTOMY  1997  . ESOPHAGOGASTRODUODENOSCOPY N/A 03/30/2019   Procedure: ESOPHAGOGASTRODUODENOSCOPY (EGD);  Surgeon: Jerene Bears, MD;  Location: [facility];  Service: Gastroenterology;  Laterality: N/A;  . ESOPHAGOGASTRODUODENOSCOPY (EGD) WITH PROPOFOL N/A 11/25/2016   Procedure: ESOPHAGOGASTRODUODENOSCOPY (EGD) WITH PROPOFOL;  Surgeon: [name], MD;  Location: [facility];  Service: Endoscopy;  Laterality: N/A;  . EYE SURGERY Bilateral    Cat Sx  . SPINE SURGERY  1997   correct scoliosis    No Known Allergies  Allergies as of 02/15/2020   No Known Allergies     Medication List       Accurate as of February 15, 2020 11:59 PM. If you have any questions, ask your nurse or doctor.        acetaminophen 500 MG tablet Commonly known as: TYLENOL Take 500 mg by mouth 2 (two) times daily.   acetaminophen 325 MG tablet Commonly known as: TYLENOL Take 650 mg by mouth every 8 (eight) hours as needed.   aspirin 81 MG chewable tablet Chew 81 mg by mouth daily.   atenolol 25 MG tablet Commonly known as: TENORMIN Take 12.5 mg by mouth 2 (two) times daily. Hold if SBP < 100 or HR < 60   benzocaine-menthol 6-10 MG lozenge Commonly known as: CHLORAEPTIC Take 1 lozenge by mouth every 2 (two) hours as needed for sore throat.   Biotin 5000 MCG Tabs Take 5,000 mcg by mouth daily.   famotidine 20 MG tablet Commonly known as: PEPCID Take 20 mg by mouth daily.   fluticasone 50 MCG/ACT nasal spray Commonly known as: FLONASE Place 2 sprays into both nostrils daily.   hydrogen peroxide 1.5 % Soln Use 15 ml orally, swish and spit as needed for dry mouth.   IBgard 90 MG Cpcr Generic  drug: Peppermint Oil Take 2 capsules by mouth 2 (two) times daily.   ipratropium 0.06 % nasal spray Commonly known as: ATROVENT Place 2 sprays into both nostrils 2 (two) times daily as needed for rhinitis.   lactose free nutrition Liqd Take 237 mLs by mouth 2 (two) times daily with a meal. Per request of resident, states can't eat if takes it later   lidocaine-prilocaine cream Commonly known as: EMLA Apply 1 application topically daily as needed.   loratadine 10 MG tablet Commonly known as: CLARITIN Take 10 mg by mouth daily as needed for allergies.   Menthol (Topical Analgesic) 4 % Gel Apply 1 application topically every 8 (eight) hours. Apply to LLE   ondansetron 4 MG tablet Commonly known as: ZOFRAN Take 4 mg by mouth every 6 (six) hours as needed for nausea or vomiting.   polyethylene glycol 17 g packet Commonly known as: MIRALAX / GLYCOLAX Take 8.5-17 g by mouth See admin instructions. 1/2 dose QAM scheduled, 1 dose qd prn   pregabalin 50 MG capsule Commonly known as: LYRICA Take 25 mg by mouth at bedtime.   pregabalin 25 MG capsule Commonly known as: LYRICA Take 1 capsule (25 mg total) by mouth in the morning.   SalivaMAX Pack Use as directed 1 Package in  the mouth or throat. mix with 25m's of H20; swish and spit BID; may keep in bathroom; may self administer.   sennosides-docusate sodium 8.6-50 MG tablet Commonly known as: SENOKOT-S Take 2 tablets by mouth daily.   sodium phosphate 7-19 GM/118ML Enem Place 1 enema rectally as needed for severe constipation. Give Fleet enema per rectum X 1 after removing hard stool or if soft stool is present in rectum.   Systane Balance 0.6 % Soln Generic drug: Propylene Glycol Give 1 drop to both eyes once daily as needed for dryness   traMADol 50 MG tablet Commonly known as: ULTRAM Take 50 mg by mouth daily as needed.   traMADol 50 MG tablet Commonly known as: ULTRAM Take 1 tablet (50 mg total) by mouth 2 (two) times  daily.   triamcinolone 0.1 % Commonly known as: KENALOG Apply 1 application topically as needed. Apply to itchy areas as needed. May keep in room and self apply   VIACTIV PO Take 1 tablet by mouth daily.   Vitamin B-2 25 MG Tabs Take 1 tablet by mouth daily.       Review of Systems  Constitutional: Negative for appetite change, fatigue and fever.  HENT: Positive for hearing loss and trouble swallowing. Negative for congestion, mouth sores, sore throat and voice change.   Eyes: Positive for visual disturbance. Negative for pain.       Minimal vision of the right eye  Respiratory: Negative for cough and shortness of breath.   Cardiovascular: Negative for leg swelling.  Gastrointestinal: Negative for abdominal pain, constipation, nausea and vomiting.  Genitourinary: Negative for dysuria, hematuria and urgency.       New onset urinary incontinence.   Musculoskeletal: Positive for arthralgias, back pain and gait problem.  Skin: Negative for color change.  Neurological: Negative for facial asymmetry, speech difficulty, weakness, light-headedness and headaches.  Psychiatric/Behavioral: Negative for behavioral problems and sleep disturbance. The patient is not nervous/anxious.     Immunization History  Administered Date(s) Administered  . Influenza Split 10/20/2011, 11/03/2012  . Influenza, High Dose Seasonal PF 10/24/2018  . Influenza,inj,Quad PF,6+ Mos 09/29/2012, 11/09/2013  . Influenza-Unspecified 10/17/2014, 10/23/2015, 10/20/2016, 10/17/2017, 10/16/2019  . Moderna Sars-Covid-2 Vaccination 01/15/2019, 02/12/2019, 11/26/2019  . PPD Test 08/21/2013, 03/04/2014  . Pneumococcal Conjugate-13 08/21/2013  . Pneumococcal Polysaccharide-23 09/08/2016  . Tdap 05/20/2008  . Tetanus 11/02/2018  . Zoster 11/01/2012   Pertinent  Health Maintenance Due  Topic Date Due  . FOOT EXAM  Never done  . OPHTHALMOLOGY EXAM  Never done  . URINE MICROALBUMIN  Never done  . HEMOGLOBIN A1C   06/28/2019  . INFLUENZA VACCINE  Completed  . DEXA SCAN  Completed  . PNA vac Low Risk Adult  Completed   Fall Risk  11/04/2017 09/05/2017 09/22/2016 08/25/2016 05/31/2016  Falls in the past year? Yes Yes Yes Yes No  Number falls in past yr: 2 or more 2 or more 1 2 or more -  Comment - - - - -  Injury with Fall? Yes Yes Yes Yes -  Comment - - fx 3 ribs - -  Risk Factor Category  - - - - -  Risk for fall due to : - - Impaired mobility;Impaired balance/gait - -  Risk for fall due to: Comment - - - - -   Functional Status Survey:    Vitals:   02/15/20 0908  BP: (!) 144/71  Pulse: 77  Resp: 16  Temp: (!) 97 F (36.1 C)  SpO2: 96%  Weight: 86 lb 9.6 oz (39.3 kg)  Height: '5\' 2"'  (1.575 m)   Body mass index is 15.84 kg/m. Physical Exam Vitals and nursing note reviewed.  Constitutional:      Appearance: Normal appearance.  HENT:     Head: Normocephalic and atraumatic.     Nose: Nose normal. No congestion or rhinorrhea.     Mouth/Throat:     Mouth: Mucous membranes are moist.  Eyes:     General: Vision grossly intact. Gaze aligned appropriately. No allergic shiner or visual field deficit.       Right eye: No foreign body or hordeolum.        Left eye: No foreign body or hordeolum.     Extraocular Movements: Extraocular movements intact.     Conjunctiva/sclera: Conjunctivae normal.     Right eye: Right conjunctiva is not injected. No exudate or hemorrhage.    Left eye: Left conjunctiva is not injected. No exudate or hemorrhage.    Pupils: Pupils are equal, round, and reactive to light.  Cardiovascular:     Rate and Rhythm: Normal rate. Rhythm irregular.     Heart sounds: No murmur heard.   Pulmonary:     Effort: Pulmonary effort is normal.     Breath sounds: No rales.  Abdominal:     General: Bowel sounds are normal.     Palpations: Abdomen is soft.     Tenderness: There is no abdominal tenderness.  Musculoskeletal:     Cervical back: Normal range of motion and neck  supple.     Right lower leg: No edema.     Left lower leg: No edema.  Skin:    General: Skin is warm and dry.     Comments: Ecchymoses BUE  Neurological:     General: No focal deficit present.     Mental Status: She is alert. Mental status is at baseline.     Gait: Gait abnormal.     Comments: Oriented to person, place.   Psychiatric:        Mood and Affect: Mood normal.        Behavior: Behavior normal.        Thought Content: Thought content normal.     Labs reviewed: Recent Labs    09/03/19 0000 01/21/20 0000 02/10/20 1329 02/10/20 1330  NA 139 141 138 137  K 4.7 4.8 4.3 4.2  CL 103 104 102 99  CO2 30* 30*  --  28  GLUCOSE  --   --  124* 127*  BUN 15 25* 25* 20  CREATININE 0.9 1.0 1.00 1.06*  CALCIUM 9.1 9.2  --  9.6   Recent Labs    09/03/19 0000 01/21/20 0000 02/10/20 1330  AST '20 20 27  ' ALT '11 12 19  ' ALKPHOS 62 69 62  BILITOT  --   --  0.7  PROT  --   --  6.7  ALBUMIN 3.3* 3.3* 3.6   Recent Labs    12/04/19 0000 01/21/20 0000 02/10/20 1329 02/10/20 1330  WBC 7.1 6.8  --  10.1  NEUTROABS 4,012.00 3,808.00  --  7.0  HGB 13.5 13.6 14.3 13.8  HCT 39 39 42.0 41.0  MCV  --   --   --  104.6*  PLT 153 135*  --  171   Lab Results  Component Value Date   TSH 2.68 09/03/2019   Lab Results  Component Value Date   HGBA1C 5.8 12/28/2018   Lab Results  Component Value  Date   CHOL 187 11/03/2017   HDL 84 (A) 11/03/2017   LDLCALC 80 11/03/2017   TRIG 136 11/03/2017   CHOLHDL 2.1 07/29/2016    Significant Diagnostic Results in last 30 days:  CT HEAD WO CONTRAST  Result Date: 02/10/2020 CLINICAL DATA:  Altered mental status. EXAM: CT HEAD WITHOUT CONTRAST TECHNIQUE: Contiguous axial images were obtained from the base of the skull through the vertex without intravenous contrast. COMPARISON:  CT head dated 02/05/2017. FINDINGS: Brain: No evidence of acute infarction, hemorrhage, hydrocephalus, extra-axial collection or mass lesion/mass effect. There is  moderate cerebral volume loss with associated ex vacuo dilatation. Periventricular white matter hypoattenuation likely represents chronic small vessel ischemic disease. Vascular: There are vascular calcifications in the carotid siphons. Skull: Normal. Negative for fracture or focal lesion. Sinuses/Orbits: No acute finding. Other: None. IMPRESSION: No acute intracranial process. Electronically Signed   By: Zerita Boers M.D.   On: 02/10/2020 13:46    Assessment/Plan Loss, vision, sudden, bilateral Loss, vision, sudden, bilateral resolved after a few minutes w/o intervention, baseline minimal vision of the right eye. S/p intravitreal inj for retinal vein occlusion with edema. Hx of bilateral macular degeneration, hypertensive retinopathy.  CT head 02/10/20 chronic small vessel ischemic disease, no acute intracranial process. ? TIA F/u ophthalmology 02/15/20 Carotid R+L artery US(last done 2014) showed no significnt stenosis of the internal carotid artery.    Carotid stenosis 2016 US carotid artery: Left totally occluded. Right 40% occlusion. 02/15/20 Carotid R+L artery US(last done 2014) showed no significnt stenosis of the internal carotid artery.  Vascular dementia ([place]) CT head 02/10/20 chronic small vessel ischemic disease, no acute intracranial process.   Neuropathy takesLyrica  Dysphagia s/p dilatation food impaction, f/u GI, takes Famotidine 57m qd   Atrial fibrillation with RVR (HCC) takes Atenolol 12.521mbid. ASA 8159md.EKG 02/11/20 Afib   Constipation takes IB Gard, MiraLax prn, Senokot S II qd   Dysphonia chronic, ENT   Chronic lower back pain takes Tylenol 500m53md, prn/bidTramadol.    CKD (chronic kidney disease) stage 3, GFR 30-59 ml/min (HCC) Bun/creat 20/1.06, eGFR 47 02/10/20   Podiatry for foot exam   Family/ staff Communication: plan of care reviewed with the patient and charge nurse.   Labs/tests ordered:  none  Time spend 40 minutes.

## 2020-02-18 DIAGNOSIS — R260 Ataxic gait: Secondary | ICD-10-CM | POA: Diagnosis not present

## 2020-02-18 DIAGNOSIS — M6281 Muscle weakness (generalized): Secondary | ICD-10-CM | POA: Diagnosis not present

## 2020-02-18 DIAGNOSIS — R2681 Unsteadiness on feet: Secondary | ICD-10-CM | POA: Diagnosis not present

## 2020-02-19 ENCOUNTER — Encounter: Payer: Self-pay | Admitting: Nurse Practitioner

## 2020-02-19 DIAGNOSIS — R2681 Unsteadiness on feet: Secondary | ICD-10-CM | POA: Diagnosis not present

## 2020-02-19 DIAGNOSIS — R260 Ataxic gait: Secondary | ICD-10-CM | POA: Diagnosis not present

## 2020-02-19 DIAGNOSIS — M6281 Muscle weakness (generalized): Secondary | ICD-10-CM | POA: Diagnosis not present

## 2020-02-19 NOTE — Progress Notes (Signed)
[facility] Note  02/20/2020     CHIEF COMPLAINT Patient presents for Retina Follow Up   HISTORY OF PRESENT ILLNESS: Vanessa [name] is a 87 y.o. female who presents to the clinic today for:   HPI    Retina Follow Up    Patient presents with  CRVO/BRVO.  In right eye.  This started months ago.  Severity is moderate.  Duration of months.  Since onset it is stable.  I, the attending physician,  performed the HPI with the patient and updated documentation appropriately.          Comments    Pt states vision has been about the same OU.  Pt states vision OD has not changed and still relies on OS to do most of the work.  Pt denies eye pain or discomfort.       Last edited by Bernarda Caffey, MD on 02/20/2020  1:41 PM. (History)    Pt states her vision is doing "pretty good"  Referring physician: [name], MD [place],  [place] [id number]  HISTORICAL INFORMATION:   Selected notes from the MEDICAL RECORD NUMBER Referred by Dr. [name] for concern of CME    CURRENT MEDICATIONS: Current Outpatient Medications (Ophthalmic Drugs)  Medication Sig  . Propylene Glycol (SYSTANE BALANCE) 0.6 % SOLN Give 1 drop to both eyes once daily as needed for dryness   No current facility-administered medications for this visit. (Ophthalmic Drugs)   Current Outpatient Medications (Other)  Medication Sig  . acetaminophen (TYLENOL) 325 MG tablet Take 650 mg by mouth every 8 (eight) hours as needed.  Marland Kitchen acetaminophen (TYLENOL) 500 MG tablet Take 500 mg by mouth 2 (two) times daily.  . Artificial Saliva (SALIVAMAX) PACK Use as directed 1 Package in the mouth or throat. mix with 54mL's of H20; swish and spit BID; may keep in bathroom; may self administer.  Marland Kitchen aspirin 81 MG chewable tablet Chew 81 mg by mouth daily.  Marland Kitchen atenolol (TENORMIN) 25 MG tablet Take 12.5 mg by mouth 2 (two) times daily. Hold if SBP < 100 or HR < 60  . benzocaine-menthol  (CHLORAEPTIC) 6-10 MG lozenge Take 1 lozenge by mouth every 2 (two) hours as needed for sore throat.  . Biotin 5000 MCG TABS Take 5,000 mcg by mouth daily.  . Calcium-Vitamin D-Vitamin K (VIACTIV PO) Take 1 tablet by mouth daily.  . famotidine (PEPCID) 20 MG tablet Take 20 mg by mouth daily.  . fluticasone (FLONASE) 50 MCG/ACT nasal spray Place 2 sprays into both nostrils daily.  . hydrogen peroxide 1.5 % SOLN Use 15 ml orally, swish and spit as needed for dry mouth.  Marland Kitchen ipratropium (ATROVENT) 0.06 % nasal spray Place 2 sprays into both nostrils 2 (two) times daily as needed for rhinitis.  Marland Kitchen lactose free nutrition (BOOST PLUS) LIQD Take 237 mLs by mouth 2 (two) times daily with a meal. Per request of resident, states can't eat if takes it later  . lidocaine-prilocaine (EMLA) cream Apply 1 application topically daily as needed.  . loratadine (CLARITIN) 10 MG tablet Take 10 mg by mouth daily as needed for allergies.  . Menthol, Topical Analgesic, 4 % GEL Apply 1 application topically every 8 (eight) hours. Apply to LLE  . ondansetron (ZOFRAN) 4 MG tablet Take 4 mg by mouth every 6 (six) hours as needed for nausea or vomiting.  Marland Kitchen Peppermint Oil (IBGARD) 90 MG CPCR Take 2 capsules by  mouth 2 (two) times daily.   . polyethylene glycol (MIRALAX / GLYCOLAX) packet Take 8.5-17 g by mouth See admin instructions. 1/2 dose QAM scheduled, 1 dose qd prn  . pregabalin (LYRICA) 25 MG capsule Take 1 capsule (25 mg total) by mouth in the morning.  . pregabalin (LYRICA) 50 MG capsule Take 25 mg by mouth at bedtime.  . Riboflavin (VITAMIN B-2) 25 MG TABS Take 1 tablet by mouth daily.  . sennosides-docusate sodium (SENOKOT-S) 8.6-50 MG tablet Take 2 tablets by mouth daily.   . sodium phosphate (FLEET) 7-19 GM/118ML ENEM Place 1 enema rectally as needed for severe constipation. Give Fleet enema per rectum X 1 after removing hard stool or if soft stool is present in rectum.  . traMADol (ULTRAM) 50 MG tablet Take 50 mg  by mouth daily as needed.   . traMADol (ULTRAM) 50 MG tablet Take 1 tablet (50 mg total) by mouth 2 (two) times daily.  Marland Kitchen triamcinolone cream (KENALOG) 0.1 % Apply 1 application topically as needed. Apply to itchy areas as needed. May keep in room and self apply   No current facility-administered medications for this visit. (Other)      REVIEW OF SYSTEMS: ROS    Positive for: Gastrointestinal, Neurological, Musculoskeletal, Cardiovascular, Eyes   Negative for: Constitutional, Skin, Genitourinary, HENT, Endocrine, Respiratory, Psychiatric, Allergic/Imm, Heme/Lymph   Last edited by Doneen Poisson on 02/20/2020  1:29 PM. (History)       ALLERGIES No Known Allergies  PAST MEDICAL HISTORY Past Medical History:  Diagnosis Date  . Abdominal bloating   . Atrial fibrillation ([place])   . Bruises easily   . Carotid artery occlusion    LEFT  . Cricopharyngeal dysphagia   . Dizziness   . DOE (dyspnea on exertion) 04/02/2014  . Fall at home Sept 2013, Dec. 2013  Jun 08, 2012  . GERD (gastroesophageal reflux disease) 02/11/2015  . Headache(784.0)   . Hoarseness   . Hypercholesterolemia   . Hyperglycemia 04/02/2014   Glucose 178 mg percent on 01/22/2014 04/05/14 Hgb A1c 6.6 Diet controlled.    . Hypertensive retinopathy    OU  . Hypothyroidism 04/15/2015  . Neuropathy    PERIPHERAL  . Pruritus   . Scoliosis   . Varicose veins   . Zenker's diverticulum    Past Surgical History:  Procedure Laterality Date  . ABDOMINAL HYSTERECTOMY  1954  . CATARACT EXTRACTION Bilateral   . CHOLECYSTECTOMY  1997  . ESOPHAGOGASTRODUODENOSCOPY N/A 03/30/2019   Procedure: ESOPHAGOGASTRODUODENOSCOPY (EGD);  Surgeon: Jerene Bears, MD;  Location: [facility];  Service: Gastroenterology;  Laterality: N/A;  . ESOPHAGOGASTRODUODENOSCOPY (EGD) WITH PROPOFOL N/A 11/25/2016   Procedure: ESOPHAGOGASTRODUODENOSCOPY (EGD) WITH PROPOFOL;  Surgeon: [name], MD;  Location: [facility];  Service: Endoscopy;   Laterality: N/A;  . EYE SURGERY Bilateral    Cat Sx  . SPINE SURGERY  1997   correct scoliosis    FAMILY HISTORY Family History  Adopted: Yes  Problem Relation Age of Onset  . Diabetes Mother   . Heart attack Mother   . Heart disease Mother        After age 71  . Heart attack Father   . Stroke Father   . Breast cancer Sister   . Heart disease Maternal Grandmother   . Cancer Son        adopted son. esophagus, stomach, liver  . Stomach cancer Neg Hx   . Colon cancer Neg Hx     SOCIAL HISTORY  Social History   Tobacco Use  . Smoking status: Never Smoker  . Smokeless tobacco: Never Used  Vaping Use  . Vaping Use: Never used  Substance Use Topics  . Alcohol use: No    Alcohol/week: 0.0 standard drinks  . Drug use: No         OPHTHALMIC EXAM:  Base Eye Exam    Visual Acuity (Snellen - Linear)      Right Left   Dist cc CF @ 3' 20/25 -2   Dist ph cc NI    Correction: Glasses       Tonometry (Tonopen, 1:33 PM)      Right Left   Pressure 16 12       Pupils      Dark Light Shape React APD   Right 2 2 Round min-NR 0   Left 2 2 Round min-NR 0       Visual Fields      Left Right    Full Full       Extraocular Movement      Right Left    Full Full       Neuro/Psych    Oriented x3: Yes   Mood/Affect: Normal       Dilation    Both eyes: 1.0% Mydriacyl, 2.5% Phenylephrine @ 1:36 PM        Slit Lamp and Fundus Exam    Slit Lamp Exam      Right Left   Lids/Lashes Dermatochalasis - upper lid Dermatochalasis - upper lid, Meibomian gland dysfunction   Conjunctiva/Sclera White and quiet White and quiet   Cornea Arcus, 1+ diffuse Punctate epithelial erosions, mild endo pigment Arcus, 1+ Punctate epithelial erosions, mild endo pigment   Anterior Chamber deep and clear deep and clear   Iris Round and moderately dilated to 34mm, No NVI Round and moderately dilated to 53mm   Lens PC IOL in good position with open PC PC IOL in good position with open PC    Vitreous Vitreous syneresis Vitreous syneresis       Fundus Exam      Right Left   Disc Mild pallor, sharp rim, nasal hyperemia, vascular loops superiorly Pink and Sharp   C/D Ratio 0.4 0.4   Macula Massive recurrence of CME, Blunted foveal reflex, RPE mottling and clumping, interval increase in IRH/DBH Flat, Blunted foveal reflex, Drusen, RPE mottling and clumping, No heme or edema   Vessels Attenuated, tortuous Vascular attenuation, Tortuous   Periphery Attached, choroidal nevus at 0930 equator w/ overlying drusen, minimal elavation, no orange pigment, 3DD in diam, 360 DBH, scattered IRF/DBH Attached, reticular degeneration, peripheral cystoid degeneration        Refraction    Wearing Rx      Sphere Cylinder Axis Add   Right -2.75 +2.75 010 +3.25   Left -3.75 +3.25 011 +3.25          IMAGING AND PROCEDURES  Imaging and Procedures for @TODAY @  OCT, Retina - OU - Both Eyes       Right Eye Quality was good. Central Foveal Thickness: 1023. Progression has worsened. Findings include retinal drusen , inner retinal atrophy, intraretinal hyper-reflective material, abnormal foveal contour, subretinal fluid, outer retinal atrophy, intraretinal fluid (Massive IRF).   Left Eye Quality was good. Central Foveal Thickness: 225. Progression has been stable. Findings include normal foveal contour, no SRF, no IRF, retinal drusen , outer retinal atrophy (Mild patchy ORA; no significant change from prior).   Notes *Images  captured and stored on drive  Diagnosis / Impression:  non-exu ARMD OU OD: CRVO; massive IRF/CME  OS: NFP; no IRF/SRF; No significant change from prior.    Clinical management:  See below  Abbreviations: NFP - Normal foveal profile. CME - cystoid macular edema. PED - pigment epithelial detachment. IRF - intraretinal fluid. SRF - subretinal fluid. EZ - ellipsoid zone. ERM - epiretinal membrane. ORA - outer retinal atrophy. ORT - outer retinal tubulation. SRHM -  subretinal hyper-reflective material                 ASSESSMENT/PLAN:    ICD-10-CM   1. Central retinal vein occlusion with macular edema of right eye  H34.8110   2. Retinal edema  H35.81 OCT, Retina - OU - Both Eyes  3. Intermediate stage nonexudative age-related macular degeneration of both eyes  H35.3132   4. Essential hypertension  I10   5. Hypertensive retinopathy of both eyes  H35.033   6. Nevus of choroid of right eye  D31.31   7. Pseudophakia of both eyes  Z96.1     1,2. CRVO with CME OD  - pt last to f/u since 04/27/19 -- 10 mos  - s/p IVA OD #1 (01.22.20), #2 (02.18.20), #3 (04.22.20),#4 (05.20.20), #5 (06.24.20), #6 (08.04.20), #7 (09.08.20), #8 (2.12.21)  - FA 2.19.2020 shows delayed filling time OD, no active leakage -- ?RAO component  - pt had massive recurrence of CME OD on 2.12.21 -- 5 mos since previous IVA OD; persistent ORA  - today, OCT shows massive recurrence of IRF   - BCVA OD stable at [facility]  - pt extremely confused and unable to reliably follow commands and cooperate with examination  - do no recommend injection today  - informed consent for Avastin signed and scanned on 09.08.2020 (OD)  - F/U 6 weeks -- DFE/OCT  3. Age related macular degeneration, non-exudative, intermediate stage OU  - The incidence, anatomy, and pathology of dry AMD, risk of progression, and the AREDS and AREDS 2 study including smoking risks discussed with patient.  - recommend amsler grid monitoring  - monitor  4,5. Hypertensive retinopathy OU  - discussed importance of tight BP control  - monitor  6. Choroidal Nevus OD  - ~3DD in diameter, round, located at 0930 equator  - +drusen; no orange pigment or SRF  - minimal elevation  - baseline Optos imaging obtained 2.19.2020  - optos imaging repeated 2.14.2021  - stable today  - monitor  7. Pseudophakia OU  - s/p CE/IOL OU  - beautiful surgeries, doing well  - monitor   Ophthalmic Meds Ordered this visit:  No orders  of the defined types were placed in this encounter.      Return in about 6 weeks (around 04/02/2020) for f/u CRVO OD, DFE, OCT.  There are no Patient Instructions on file for this visit.   Explained the diagnoses, plan, and follow up with the patient and they expressed understanding.  Patient expressed understanding of the importance of proper follow up care.   This document serves as a record of services personally performed by Gardiner Sleeper, MD, PhD. It was created on their behalf by Roselee Nova, COMT. The creation of this record is the provider's dictation and/or activities during the visit.  Electronically signed by: Roselee Nova, COMT 02/20/20 3:12 PM   This document serves as a record of services personally performed by Gardiner Sleeper, MD, PhD. It was created on their behalf by San Jetty. Owens Shark,  OA an ophthalmic technician. The creation of this record is the provider's dictation and/or activities during the visit.    Electronically signed by: San Jetty. Owens Shark, [place] 02.09.2022 3:12 PM   Gardiner Sleeper, M.D., Ph.D. Diseases & Surgery of the Retina and Vitreous [facility]  I have reviewed the above documentation for accuracy and completeness, and I agree with the above. Gardiner Sleeper, M.D., Ph.D. 02/20/20 3:12 PM    Abbreviations: M myopia (nearsighted); A astigmatism; H hyperopia (farsighted); P presbyopia; Mrx spectacle prescription;  CTL contact lenses; OD right eye; OS left eye; OU both eyes  XT exotropia; ET esotropia; PEK punctate epithelial keratitis; PEE punctate epithelial erosions; DES dry eye syndrome; MGD meibomian gland dysfunction; ATs artificial tears; PFAT's preservative free artificial tears; [place] nuclear sclerotic cataract; PSC posterior subcapsular cataract; ERM epi-retinal membrane; PVD posterior vitreous detachment; RD retinal detachment; DM diabetes mellitus; DR diabetic retinopathy; NPDR non-proliferative diabetic retinopathy; PDR  proliferative diabetic retinopathy; CSME clinically significant macular edema; DME diabetic macular edema; dbh dot blot hemorrhages; CWS cotton wool spot; POAG primary open angle glaucoma; C/D cup-to-disc ratio; HVF humphrey visual field; GVF goldmann visual field; OCT optical coherence tomography; IOP intraocular pressure; BRVO Branch retinal vein occlusion; CRVO central retinal vein occlusion; CRAO central retinal artery occlusion; BRAO branch retinal artery occlusion; RT retinal tear; SB scleral buckle; PPV pars plana vitrectomy; VH Vitreous hemorrhage; PRP panretinal laser photocoagulation; IVK intravitreal kenalog; VMT vitreomacular traction; MH Macular hole;  NVD neovascularization of the disc; NVE neovascularization elsewhere; AREDS age related eye disease study; ARMD age related macular degeneration; POAG primary open angle glaucoma; EBMD epithelial/anterior basement membrane dystrophy; ACIOL anterior chamber intraocular lens; IOL intraocular lens; PCIOL posterior chamber intraocular lens; Phaco/IOL phacoemulsification with intraocular lens placement; [place] photorefractive keratectomy; LASIK laser assisted in situ keratomileusis; HTN hypertension; DM diabetes mellitus; COPD chronic obstructive pulmonary disease

## 2020-02-20 ENCOUNTER — Ambulatory Visit (INDEPENDENT_AMBULATORY_CARE_PROVIDER_SITE_OTHER): Payer: Medicare Other | Admitting: Ophthalmology

## 2020-02-20 ENCOUNTER — Other Ambulatory Visit: Payer: Self-pay

## 2020-02-20 ENCOUNTER — Encounter (INDEPENDENT_AMBULATORY_CARE_PROVIDER_SITE_OTHER): Payer: Self-pay | Admitting: Ophthalmology

## 2020-02-20 DIAGNOSIS — H34811 Central retinal vein occlusion, right eye, with macular edema: Secondary | ICD-10-CM | POA: Diagnosis not present

## 2020-02-20 DIAGNOSIS — R2681 Unsteadiness on feet: Secondary | ICD-10-CM | POA: Diagnosis not present

## 2020-02-20 DIAGNOSIS — I10 Essential (primary) hypertension: Secondary | ICD-10-CM | POA: Diagnosis not present

## 2020-02-20 DIAGNOSIS — M6281 Muscle weakness (generalized): Secondary | ICD-10-CM | POA: Diagnosis not present

## 2020-02-20 DIAGNOSIS — Z961 Presence of intraocular lens: Secondary | ICD-10-CM

## 2020-02-20 DIAGNOSIS — D3131 Benign neoplasm of right choroid: Secondary | ICD-10-CM

## 2020-02-20 DIAGNOSIS — H353132 Nonexudative age-related macular degeneration, bilateral, intermediate dry stage: Secondary | ICD-10-CM | POA: Diagnosis not present

## 2020-02-20 DIAGNOSIS — H3581 Retinal edema: Secondary | ICD-10-CM

## 2020-02-20 DIAGNOSIS — H35033 Hypertensive retinopathy, bilateral: Secondary | ICD-10-CM

## 2020-02-20 DIAGNOSIS — R260 Ataxic gait: Secondary | ICD-10-CM | POA: Diagnosis not present

## 2020-02-21 DIAGNOSIS — R2681 Unsteadiness on feet: Secondary | ICD-10-CM | POA: Diagnosis not present

## 2020-02-21 DIAGNOSIS — R260 Ataxic gait: Secondary | ICD-10-CM | POA: Diagnosis not present

## 2020-02-21 DIAGNOSIS — M6281 Muscle weakness (generalized): Secondary | ICD-10-CM | POA: Diagnosis not present

## 2020-02-22 DIAGNOSIS — M6281 Muscle weakness (generalized): Secondary | ICD-10-CM | POA: Diagnosis not present

## 2020-02-22 DIAGNOSIS — R2681 Unsteadiness on feet: Secondary | ICD-10-CM | POA: Diagnosis not present

## 2020-02-22 DIAGNOSIS — R260 Ataxic gait: Secondary | ICD-10-CM | POA: Diagnosis not present

## 2020-02-26 DIAGNOSIS — M6281 Muscle weakness (generalized): Secondary | ICD-10-CM | POA: Diagnosis not present

## 2020-02-26 DIAGNOSIS — R260 Ataxic gait: Secondary | ICD-10-CM | POA: Diagnosis not present

## 2020-02-26 DIAGNOSIS — R2681 Unsteadiness on feet: Secondary | ICD-10-CM | POA: Diagnosis not present

## 2020-02-27 DIAGNOSIS — M6281 Muscle weakness (generalized): Secondary | ICD-10-CM | POA: Diagnosis not present

## 2020-02-27 DIAGNOSIS — R2681 Unsteadiness on feet: Secondary | ICD-10-CM | POA: Diagnosis not present

## 2020-02-27 DIAGNOSIS — R260 Ataxic gait: Secondary | ICD-10-CM | POA: Diagnosis not present

## 2020-02-28 DIAGNOSIS — M6281 Muscle weakness (generalized): Secondary | ICD-10-CM | POA: Diagnosis not present

## 2020-02-28 DIAGNOSIS — R260 Ataxic gait: Secondary | ICD-10-CM | POA: Diagnosis not present

## 2020-02-28 DIAGNOSIS — R2681 Unsteadiness on feet: Secondary | ICD-10-CM | POA: Diagnosis not present

## 2020-02-29 DIAGNOSIS — R2681 Unsteadiness on feet: Secondary | ICD-10-CM | POA: Diagnosis not present

## 2020-02-29 DIAGNOSIS — M6281 Muscle weakness (generalized): Secondary | ICD-10-CM | POA: Diagnosis not present

## 2020-02-29 DIAGNOSIS — R260 Ataxic gait: Secondary | ICD-10-CM | POA: Diagnosis not present

## 2020-03-03 DIAGNOSIS — R260 Ataxic gait: Secondary | ICD-10-CM | POA: Diagnosis not present

## 2020-03-03 DIAGNOSIS — M6281 Muscle weakness (generalized): Secondary | ICD-10-CM | POA: Diagnosis not present

## 2020-03-03 DIAGNOSIS — R2681 Unsteadiness on feet: Secondary | ICD-10-CM | POA: Diagnosis not present

## 2020-03-04 DIAGNOSIS — R2681 Unsteadiness on feet: Secondary | ICD-10-CM | POA: Diagnosis not present

## 2020-03-04 DIAGNOSIS — M6281 Muscle weakness (generalized): Secondary | ICD-10-CM | POA: Diagnosis not present

## 2020-03-04 DIAGNOSIS — R260 Ataxic gait: Secondary | ICD-10-CM | POA: Diagnosis not present

## 2020-03-06 DIAGNOSIS — R260 Ataxic gait: Secondary | ICD-10-CM | POA: Diagnosis not present

## 2020-03-06 DIAGNOSIS — R2681 Unsteadiness on feet: Secondary | ICD-10-CM | POA: Diagnosis not present

## 2020-03-06 DIAGNOSIS — M6281 Muscle weakness (generalized): Secondary | ICD-10-CM | POA: Diagnosis not present

## 2020-03-10 ENCOUNTER — Other Ambulatory Visit: Payer: Self-pay | Admitting: *Deleted

## 2020-03-10 DIAGNOSIS — R260 Ataxic gait: Secondary | ICD-10-CM | POA: Diagnosis not present

## 2020-03-10 DIAGNOSIS — R2681 Unsteadiness on feet: Secondary | ICD-10-CM | POA: Diagnosis not present

## 2020-03-10 DIAGNOSIS — M6281 Muscle weakness (generalized): Secondary | ICD-10-CM | POA: Diagnosis not present

## 2020-03-10 NOTE — Telephone Encounter (Signed)
Received refill Request from [facility] and sent to Dr. Lyndel Safe for approval.

## 2020-03-11 DIAGNOSIS — M6281 Muscle weakness (generalized): Secondary | ICD-10-CM | POA: Diagnosis not present

## 2020-03-11 DIAGNOSIS — R2681 Unsteadiness on feet: Secondary | ICD-10-CM | POA: Diagnosis not present

## 2020-03-11 DIAGNOSIS — R41841 Cognitive communication deficit: Secondary | ICD-10-CM | POA: Diagnosis not present

## 2020-03-11 DIAGNOSIS — R260 Ataxic gait: Secondary | ICD-10-CM | POA: Diagnosis not present

## 2020-03-11 MED ORDER — TRAMADOL HCL 50 MG PO TABS: 50.0000 mg | ORAL_TABLET | Freq: Two times a day (BID) | ORAL | 0 refills | Status: DC

## 2020-03-13 DIAGNOSIS — M6281 Muscle weakness (generalized): Secondary | ICD-10-CM | POA: Diagnosis not present

## 2020-03-13 DIAGNOSIS — R41841 Cognitive communication deficit: Secondary | ICD-10-CM | POA: Diagnosis not present

## 2020-03-13 DIAGNOSIS — R260 Ataxic gait: Secondary | ICD-10-CM | POA: Diagnosis not present

## 2020-03-13 DIAGNOSIS — R2681 Unsteadiness on feet: Secondary | ICD-10-CM | POA: Diagnosis not present

## 2020-03-14 DIAGNOSIS — R41841 Cognitive communication deficit: Secondary | ICD-10-CM | POA: Diagnosis not present

## 2020-03-14 DIAGNOSIS — R260 Ataxic gait: Secondary | ICD-10-CM | POA: Diagnosis not present

## 2020-03-14 DIAGNOSIS — M6281 Muscle weakness (generalized): Secondary | ICD-10-CM | POA: Diagnosis not present

## 2020-03-14 DIAGNOSIS — R2681 Unsteadiness on feet: Secondary | ICD-10-CM | POA: Diagnosis not present

## 2020-03-18 ENCOUNTER — Other Ambulatory Visit: Payer: Self-pay

## 2020-03-18 DIAGNOSIS — M6281 Muscle weakness (generalized): Secondary | ICD-10-CM | POA: Diagnosis not present

## 2020-03-18 DIAGNOSIS — R2681 Unsteadiness on feet: Secondary | ICD-10-CM | POA: Diagnosis not present

## 2020-03-18 DIAGNOSIS — R41841 Cognitive communication deficit: Secondary | ICD-10-CM | POA: Diagnosis not present

## 2020-03-18 DIAGNOSIS — R260 Ataxic gait: Secondary | ICD-10-CM | POA: Diagnosis not present

## 2020-03-18 MED ORDER — PREGABALIN 25 MG PO CAPS: 25.0000 mg | ORAL_CAPSULE | Freq: Two times a day (BID) | ORAL | 5 refills | Status: DC

## 2020-03-19 DIAGNOSIS — M6281 Muscle weakness (generalized): Secondary | ICD-10-CM | POA: Diagnosis not present

## 2020-03-19 DIAGNOSIS — R260 Ataxic gait: Secondary | ICD-10-CM | POA: Diagnosis not present

## 2020-03-19 DIAGNOSIS — R41841 Cognitive communication deficit: Secondary | ICD-10-CM | POA: Diagnosis not present

## 2020-03-19 DIAGNOSIS — R2681 Unsteadiness on feet: Secondary | ICD-10-CM | POA: Diagnosis not present

## 2020-03-21 DIAGNOSIS — R2681 Unsteadiness on feet: Secondary | ICD-10-CM | POA: Diagnosis not present

## 2020-03-21 DIAGNOSIS — R41841 Cognitive communication deficit: Secondary | ICD-10-CM | POA: Diagnosis not present

## 2020-03-21 DIAGNOSIS — M6281 Muscle weakness (generalized): Secondary | ICD-10-CM | POA: Diagnosis not present

## 2020-03-21 DIAGNOSIS — R260 Ataxic gait: Secondary | ICD-10-CM | POA: Diagnosis not present

## 2020-03-24 DIAGNOSIS — R260 Ataxic gait: Secondary | ICD-10-CM | POA: Diagnosis not present

## 2020-03-24 DIAGNOSIS — R41841 Cognitive communication deficit: Secondary | ICD-10-CM | POA: Diagnosis not present

## 2020-03-24 DIAGNOSIS — M6281 Muscle weakness (generalized): Secondary | ICD-10-CM | POA: Diagnosis not present

## 2020-03-24 DIAGNOSIS — R2681 Unsteadiness on feet: Secondary | ICD-10-CM | POA: Diagnosis not present

## 2020-03-26 DIAGNOSIS — R41841 Cognitive communication deficit: Secondary | ICD-10-CM | POA: Diagnosis not present

## 2020-03-26 DIAGNOSIS — R2681 Unsteadiness on feet: Secondary | ICD-10-CM | POA: Diagnosis not present

## 2020-03-26 DIAGNOSIS — R260 Ataxic gait: Secondary | ICD-10-CM | POA: Diagnosis not present

## 2020-03-26 DIAGNOSIS — M6281 Muscle weakness (generalized): Secondary | ICD-10-CM | POA: Diagnosis not present

## 2020-03-28 DIAGNOSIS — M6281 Muscle weakness (generalized): Secondary | ICD-10-CM | POA: Diagnosis not present

## 2020-03-28 DIAGNOSIS — R41841 Cognitive communication deficit: Secondary | ICD-10-CM | POA: Diagnosis not present

## 2020-03-28 DIAGNOSIS — R2681 Unsteadiness on feet: Secondary | ICD-10-CM | POA: Diagnosis not present

## 2020-03-28 DIAGNOSIS — R260 Ataxic gait: Secondary | ICD-10-CM | POA: Diagnosis not present

## 2020-04-01 DIAGNOSIS — R260 Ataxic gait: Secondary | ICD-10-CM | POA: Diagnosis not present

## 2020-04-01 DIAGNOSIS — R41841 Cognitive communication deficit: Secondary | ICD-10-CM | POA: Diagnosis not present

## 2020-04-01 DIAGNOSIS — R2681 Unsteadiness on feet: Secondary | ICD-10-CM | POA: Diagnosis not present

## 2020-04-01 DIAGNOSIS — M6281 Muscle weakness (generalized): Secondary | ICD-10-CM | POA: Diagnosis not present

## 2020-04-01 NOTE — Progress Notes (Signed)
[facility] Note  04/02/2020     CHIEF COMPLAINT Patient presents for Retina Follow Up   HISTORY OF PRESENT ILLNESS: Vanessa [name] is a 87 y.o. female who presents to the clinic today for:   HPI    Retina Follow Up    Patient presents with  CRVO/BRVO.  In right eye.  This started years ago.  Severity is severe.  Duration of 6 weeks.  Since onset it is stable.  I, the attending physician,  performed the HPI with the patient and updated documentation appropriately.          Comments    87 y/o female pt here for 6 wk f/u for CRVO w/CME OD.  No change in [place] OU.  Denies pain, FOL, floaters.  No gtts.       Last edited by Bernarda Caffey, MD on 04/02/2020  2:54 PM. (History)    Pt states her vision is doing "pretty good"  Referring physician: [name], MD [place],  [place] [id number]  HISTORICAL INFORMATION:   Selected notes from the MEDICAL RECORD NUMBER Referred by Dr. [name] for concern of CME    CURRENT MEDICATIONS: Current Outpatient Medications (Ophthalmic Drugs)  Medication Sig  . Propylene Glycol (SYSTANE BALANCE) 0.6 % SOLN Give 1 drop to both eyes once daily as needed for dryness   No current facility-administered medications for this visit. (Ophthalmic Drugs)   Current Outpatient Medications (Other)  Medication Sig  . acetaminophen (TYLENOL) 325 MG tablet Take 650 mg by mouth every 8 (eight) hours as needed.  Marland Kitchen acetaminophen (TYLENOL) 500 MG tablet Take 500 mg by mouth 2 (two) times daily.  . Artificial Saliva (SALIVAMAX) PACK Use as directed 1 Package in the mouth or throat. mix with 79mL's of H20; swish and spit BID; may keep in bathroom; may self administer.  Marland Kitchen aspirin 81 MG chewable tablet Chew 81 mg by mouth daily.  Marland Kitchen atenolol (TENORMIN) 25 MG tablet Take 12.5 mg by mouth 2 (two) times daily. Hold if SBP < 100 or HR < 60  . benzocaine-menthol (CHLORAEPTIC) 6-10 MG lozenge Take 1 lozenge by mouth every  2 (two) hours as needed for sore throat.  . Biotin 5000 MCG TABS Take 5,000 mcg by mouth daily.  . Calcium-Vitamin D-Vitamin K (VIACTIV PO) Take 1 tablet by mouth daily.  . famotidine (PEPCID) 20 MG tablet Take 20 mg by mouth daily.  . fluticasone (FLONASE) 50 MCG/ACT nasal spray Place 2 sprays into both nostrils daily.  . hydrogen peroxide 1.5 % SOLN Use 15 ml orally, swish and spit as needed for dry mouth.  Marland Kitchen ipratropium (ATROVENT) 0.06 % nasal spray Place 2 sprays into both nostrils 2 (two) times daily as needed for rhinitis.  Marland Kitchen lactose free nutrition (BOOST PLUS) LIQD Take 237 mLs by mouth 2 (two) times daily with a meal. Per request of resident, states can't eat if takes it later  . lidocaine-prilocaine (EMLA) cream Apply 1 application topically daily as needed.  . loratadine (CLARITIN) 10 MG tablet Take 10 mg by mouth daily as needed for allergies.  . Menthol, Topical Analgesic, 4 % GEL Apply 1 application topically every 8 (eight) hours. Apply to LLE  . ondansetron (ZOFRAN) 4 MG tablet Take 4 mg by mouth every 6 (six) hours as needed for nausea or vomiting.  Marland Kitchen Peppermint Oil (IBGARD) 90 MG CPCR Take 2 capsules by mouth 2 (two) times daily.   Marland Kitchen  polyethylene glycol (MIRALAX / GLYCOLAX) packet Take 8.5-17 g by mouth See admin instructions. 1/2 dose QAM scheduled, 1 dose qd prn  . pregabalin (LYRICA) 25 MG capsule Take 1 capsule (25 mg total) by mouth 2 (two) times daily.  . pregabalin (LYRICA) 50 MG capsule Take 25 mg by mouth at bedtime.  . Riboflavin (VITAMIN B-2) 25 MG TABS Take 1 tablet by mouth daily.  . sennosides-docusate sodium (SENOKOT-S) 8.6-50 MG tablet Take 2 tablets by mouth daily.   . sodium phosphate (FLEET) 7-19 GM/118ML ENEM Place 1 enema rectally as needed for severe constipation. Give Fleet enema per rectum X 1 after removing hard stool or if soft stool is present in rectum.  . traMADol (ULTRAM) 50 MG tablet Take 50 mg by mouth daily as needed.   . traMADol (ULTRAM) 50 MG  tablet Take 1 tablet (50 mg total) by mouth 2 (two) times daily.  Marland Kitchen triamcinolone cream (KENALOG) 0.1 % Apply 1 application topically as needed. Apply to itchy areas as needed. May keep in room and self apply   No current facility-administered medications for this visit. (Other)      REVIEW OF SYSTEMS: ROS    Positive for: Gastrointestinal, Neurological, Genitourinary, Musculoskeletal, HENT, Cardiovascular, Eyes   Negative for: Constitutional, Skin, Endocrine, Respiratory, Psychiatric, Allergic/Imm, Heme/Lymph   Last edited by Matthew Folks, COA on 04/02/2020  2:30 PM. (History)       ALLERGIES No Known Allergies  PAST MEDICAL HISTORY Past Medical History:  Diagnosis Date  . Abdominal bloating   . Atrial fibrillation ([place])   . Bruises easily   . Carotid artery occlusion    LEFT  . Cricopharyngeal dysphagia   . Dizziness   . DOE (dyspnea on exertion) 04/02/2014  . Fall at home Sept 2013, Dec. 2013  Jun 08, 2012  . GERD (gastroesophageal reflux disease) 02/11/2015  . Headache(784.0)   . Hoarseness   . Hypercholesterolemia   . Hyperglycemia 04/02/2014   Glucose 178 mg percent on 01/22/2014 04/05/14 Hgb A1c 6.6 Diet controlled.    . Hypertensive retinopathy    OU  . Hypothyroidism 04/15/2015  . Macular degeneration    Dry OU  . Neuropathy    PERIPHERAL  . Pruritus   . Scoliosis   . Varicose veins   . Zenker's diverticulum    Past Surgical History:  Procedure Laterality Date  . ABDOMINAL HYSTERECTOMY  1954  . CATARACT EXTRACTION Bilateral   . CHOLECYSTECTOMY  1997  . ESOPHAGOGASTRODUODENOSCOPY N/A 03/30/2019   Procedure: ESOPHAGOGASTRODUODENOSCOPY (EGD);  Surgeon: Jerene Bears, MD;  Location: [facility];  Service: Gastroenterology;  Laterality: N/A;  . ESOPHAGOGASTRODUODENOSCOPY (EGD) WITH PROPOFOL N/A 11/25/2016   Procedure: ESOPHAGOGASTRODUODENOSCOPY (EGD) WITH PROPOFOL;  Surgeon: [name], MD;  Location: [facility];  Service: Endoscopy;  Laterality:  N/A;  . EYE SURGERY Bilateral    Cat Sx  . SPINE SURGERY  1997   correct scoliosis    FAMILY HISTORY Family History  Adopted: Yes  Problem Relation Age of Onset  . Diabetes Mother   . Heart attack Mother   . Heart disease Mother        After age 15  . Heart attack Father   . Stroke Father   . Breast cancer Sister   . Heart disease Maternal Grandmother   . Cancer Son        adopted son. esophagus, stomach, liver  . Stomach cancer Neg Hx   . Colon cancer Neg Hx  SOCIAL HISTORY Social History   Tobacco Use  . Smoking status: Never Smoker  . Smokeless tobacco: Never Used  Vaping Use  . Vaping Use: Never used  Substance Use Topics  . Alcohol use: No    Alcohol/week: 0.0 standard drinks  . Drug use: No         OPHTHALMIC EXAM:  Base Eye Exam    Visual Acuity (Snellen - Linear)      Right Left   Dist cc CF @ 3' 20/25 -2   Dist ph cc NI NI       Tonometry (Tonopen, 2:33 PM)      Right Left   Pressure 15 13       Pupils      Dark Light Shape React APD   Right 2 2 Round Minimal None   Left 2 2 Round Minimal None       Visual Fields (Counting fingers)      Left Right    Full Full       Extraocular Movement      Right Left    Full, Ortho Full, Ortho       Neuro/Psych    Oriented x3: Yes   Mood/Affect: Normal       Dilation    Both eyes: 1.0% Mydriacyl, 2.5% Phenylephrine @ 2:33 PM        Slit Lamp and Fundus Exam    Slit Lamp Exam      Right Left   Lids/Lashes Dermatochalasis - upper lid Dermatochalasis - upper lid, Meibomian gland dysfunction   Conjunctiva/Sclera White and quiet White and quiet   Cornea Arcus, 1+ diffuse Punctate epithelial erosions, mild endo pigment Arcus, 1+ Punctate epithelial erosions, mild endo pigment   Anterior Chamber deep and clear deep and clear   Iris Round and moderately dilated to 68mm, No NVI Round and moderately dilated to 85mm   Lens PC IOL in good position with open PC PC IOL in good position with open  PC   Vitreous Vitreous syneresis Vitreous syneresis       Fundus Exam      Right Left   Disc Mild pallor, sharp rim, nasal hyperemia, vascular loops superiorly Pink and Sharp   C/D Ratio 0.4 0.4   Macula Massive recurrence of CME, Blunted foveal reflex, RPE mottling and clumping, interval increase in IRH/DBH Flat, Blunted foveal reflex, Drusen, RPE mottling and clumping, No heme or edema   Vessels Attenuated, tortuous, CRVO Vascular attenuation, Tortuous   Periphery Attached, choroidal nevus at 0930 equator w/ overlying drusen, minimal elavation, no orange pigment, 3DD in diam, 360 DBH, scattered IRF/DBH Attached, reticular degeneration, peripheral cystoid degeneration          IMAGING AND PROCEDURES  Imaging and Procedures for @TODAY @  OCT, Retina - OU - Both Eyes       Right Eye Quality was good. Central Foveal Thickness: 1006. Progression has been stable. Findings include retinal drusen , inner retinal atrophy, intraretinal hyper-reflective material, abnormal foveal contour, subretinal fluid, outer retinal atrophy, intraretinal fluid (Persistent Massive central CME).   Left Eye Quality was good. Central Foveal Thickness: 218. Progression has been stable. Findings include normal foveal contour, no SRF, no IRF, retinal drusen , outer retinal atrophy (Mild patchy ORA; no significant change from prior).   Notes *Images captured and stored on drive  Diagnosis / Impression:  non-exu ARMD OU OD: CRVO; Persistent Massive central CME OS: NFP; no IRF/SRF; No significant change from prior.  Clinical management:  See below  Abbreviations: NFP - Normal foveal profile. CME - cystoid macular edema. PED - pigment epithelial detachment. IRF - intraretinal fluid. SRF - subretinal fluid. EZ - ellipsoid zone. ERM - epiretinal membrane. ORA - outer retinal atrophy. ORT - outer retinal tubulation. SRHM - subretinal hyper-reflective material        Intravitreal Injection, Pharmacologic  Agent - OD - Right Eye       Time Out 04/02/2020. 3:16 PM. Confirmed correct patient, procedure, site, and patient consented.   Anesthesia Topical anesthesia was used. Anesthetic medications included Lidocaine 2%.   Procedure Preparation included 5% betadine to ocular surface, eyelid speculum. A (32g) needle was used.   Injection:  1.25 mg Bevacizumab (AVASTIN) 1.25mg /0.18mL SOLN   NDC: 54627-035-00, Lot: [id number], Expiration date: 05/20/2020   Route: Intravitreal, Site: Right Eye, Waste: 0.05 mL  Post-op Post injection exam found visual acuity of at least counting fingers. The patient tolerated the procedure well. There were no complications. The patient received written and verbal post procedure care education.                 ASSESSMENT/PLAN:    ICD-10-CM   1. Central retinal vein occlusion with macular edema of right eye  H34.8110 Intravitreal Injection, Pharmacologic Agent - OD - Right Eye    Bevacizumab (AVASTIN) SOLN 1.25 mg  2. Retinal edema  H35.81 OCT, Retina - OU - Both Eyes  3. Intermediate stage nonexudative age-related macular degeneration of both eyes  H35.3132   4. Essential hypertension  I10   5. Hypertensive retinopathy of both eyes  H35.033   6. Nevus of choroid of right eye  D31.31   7. Pseudophakia of both eyes  Z96.1     1,2. CRVO with CME OD  - pt lost to f/u from 04/27/19 to 02/20/20 (10 mos)  - s/p IVA OD #1 (01.22.20), #2 (02.18.20), #3 (04.22.20),#4 (05.20.20), #5 (06.24.20), #6 (08.04.20), #7 (09.08.20), #8 (02.12.21), #9 (03.23.21)  - FA 2.19.2020 shows delayed filling time OD, no active leakage -- ?RAO component  - pt had massive recurrence of CME OD on 02.12.21 -- 5 mos since previous IVA OD; persistent ORA  - today, OCT shows persistent Massive central CME and exam shows extensive intraretinal heme  - BCVA OD stable at CF  - recommend IVA OD #10 today, 03.23.22 for persistent IRF/CME  - informed consent for Avastin signed and scanned on  09.08.2020 (OD)  - F/U 6 weeks -- DFE/OCT, possible injection  3. Age related macular degeneration, non-exudative, intermediate stage OU  - The incidence, anatomy, and pathology of dry AMD, risk of progression, and the AREDS and AREDS 2 study including smoking risks discussed with patient.  - recommend amsler grid monitoring  - monitor  4,5. Hypertensive retinopathy OU  - discussed importance of tight BP control  - monitor  6. Choroidal Nevus OD  - ~3DD in diameter, round, located at 0930 equator  - +drusen; no orange pigment or SRF  - minimal elevation  - baseline Optos imaging obtained 02.19.20  - optos imaging repeated 02.14.21  - stable today  - monitor  7. Pseudophakia OU  - s/p CE/IOL OU  - beautiful surgeries, doing well  - monitor   Ophthalmic Meds Ordered this visit:  Meds ordered this encounter  Medications  . Bevacizumab (AVASTIN) SOLN 1.25 mg       Return in about 6 weeks (around 05/14/2020) for f/u CRVO OD, DFE, OCT.  There are no Patient  Instructions on file for this visit.   Explained the diagnoses, plan, and follow up with the patient and they expressed understanding.  Patient expressed understanding of the importance of proper follow up care.   This document serves as a record of services personally performed by Gardiner Sleeper, MD, PhD. It was created on their behalf by Roselee Nova, COMT. The creation of this record is the provider's dictation and/or activities during the visit.  Electronically signed by: Roselee Nova, COMT 04/02/20 5:05 PM   This document serves as a record of services personally performed by Gardiner Sleeper, MD, PhD. It was created on their behalf by San Jetty. Owens Shark, OA an ophthalmic technician. The creation of this record is the provider's dictation and/or activities during the visit.    Electronically signed by: San Jetty. Owens Shark, [place] 03.23.2022 5:05 PM  Gardiner Sleeper, M.D., Ph.D. Diseases & Surgery of the Retina and  Vitreous [facility]  I have reviewed the above documentation for accuracy and completeness, and I agree with the above. Gardiner Sleeper, M.D., Ph.D. 04/02/20 5:05 PM  Abbreviations: M myopia (nearsighted); A astigmatism; H hyperopia (farsighted); P presbyopia; Mrx spectacle prescription;  CTL contact lenses; OD right eye; OS left eye; OU both eyes  XT exotropia; ET esotropia; PEK punctate epithelial keratitis; PEE punctate epithelial erosions; DES dry eye syndrome; MGD meibomian gland dysfunction; ATs artificial tears; PFAT's preservative free artificial tears; [place] nuclear sclerotic cataract; PSC posterior subcapsular cataract; ERM epi-retinal membrane; PVD posterior vitreous detachment; RD retinal detachment; DM diabetes mellitus; DR diabetic retinopathy; NPDR non-proliferative diabetic retinopathy; PDR proliferative diabetic retinopathy; CSME clinically significant macular edema; DME diabetic macular edema; dbh dot blot hemorrhages; CWS cotton wool spot; POAG primary open angle glaucoma; C/D cup-to-disc ratio; HVF humphrey visual field; GVF goldmann visual field; OCT optical coherence tomography; IOP intraocular pressure; BRVO Branch retinal vein occlusion; CRVO central retinal vein occlusion; CRAO central retinal artery occlusion; BRAO branch retinal artery occlusion; RT retinal tear; SB scleral buckle; PPV pars plana vitrectomy; VH Vitreous hemorrhage; PRP panretinal laser photocoagulation; IVK intravitreal kenalog; VMT vitreomacular traction; MH Macular hole;  NVD neovascularization of the disc; NVE neovascularization elsewhere; AREDS age related eye disease study; ARMD age related macular degeneration; POAG primary open angle glaucoma; EBMD epithelial/anterior basement membrane dystrophy; ACIOL anterior chamber intraocular lens; IOL intraocular lens; PCIOL posterior chamber intraocular lens; Phaco/IOL phacoemulsification with intraocular lens placement; [place] photorefractive  keratectomy; LASIK laser assisted in situ keratomileusis; HTN hypertension; DM diabetes mellitus; COPD chronic obstructive pulmonary disease

## 2020-04-02 ENCOUNTER — Other Ambulatory Visit: Payer: Self-pay

## 2020-04-02 ENCOUNTER — Encounter (INDEPENDENT_AMBULATORY_CARE_PROVIDER_SITE_OTHER): Payer: Self-pay | Admitting: Ophthalmology

## 2020-04-02 ENCOUNTER — Ambulatory Visit (INDEPENDENT_AMBULATORY_CARE_PROVIDER_SITE_OTHER): Payer: Medicare Other | Admitting: Ophthalmology

## 2020-04-02 DIAGNOSIS — H353132 Nonexudative age-related macular degeneration, bilateral, intermediate dry stage: Secondary | ICD-10-CM | POA: Diagnosis not present

## 2020-04-02 DIAGNOSIS — H35033 Hypertensive retinopathy, bilateral: Secondary | ICD-10-CM | POA: Diagnosis not present

## 2020-04-02 DIAGNOSIS — D3131 Benign neoplasm of right choroid: Secondary | ICD-10-CM

## 2020-04-02 DIAGNOSIS — I10 Essential (primary) hypertension: Secondary | ICD-10-CM | POA: Diagnosis not present

## 2020-04-02 DIAGNOSIS — R2681 Unsteadiness on feet: Secondary | ICD-10-CM | POA: Diagnosis not present

## 2020-04-02 DIAGNOSIS — H34811 Central retinal vein occlusion, right eye, with macular edema: Secondary | ICD-10-CM | POA: Diagnosis not present

## 2020-04-02 DIAGNOSIS — H3581 Retinal edema: Secondary | ICD-10-CM

## 2020-04-02 DIAGNOSIS — Z961 Presence of intraocular lens: Secondary | ICD-10-CM | POA: Diagnosis not present

## 2020-04-02 DIAGNOSIS — R260 Ataxic gait: Secondary | ICD-10-CM | POA: Diagnosis not present

## 2020-04-02 DIAGNOSIS — M6281 Muscle weakness (generalized): Secondary | ICD-10-CM | POA: Diagnosis not present

## 2020-04-02 DIAGNOSIS — R41841 Cognitive communication deficit: Secondary | ICD-10-CM | POA: Diagnosis not present

## 2020-04-02 MED ORDER — BEVACIZUMAB CHEMO INJECTION 1.25MG/0.05ML SYRINGE FOR KALEIDOSCOPE
1.2500 mg | INTRAVITREAL | Status: AC | PRN
  Administered 2020-04-02: 1.25 mg via INTRAVITREAL

## 2020-04-04 DIAGNOSIS — R2681 Unsteadiness on feet: Secondary | ICD-10-CM | POA: Diagnosis not present

## 2020-04-04 DIAGNOSIS — R41841 Cognitive communication deficit: Secondary | ICD-10-CM | POA: Diagnosis not present

## 2020-04-04 DIAGNOSIS — M6281 Muscle weakness (generalized): Secondary | ICD-10-CM | POA: Diagnosis not present

## 2020-04-04 DIAGNOSIS — R260 Ataxic gait: Secondary | ICD-10-CM | POA: Diagnosis not present

## 2020-04-05 ENCOUNTER — Emergency Department (HOSPITAL_COMMUNITY)
Admission: EM | Admit: 2020-04-05 | Discharge: 2020-04-05 | Disposition: A | Discharge status: Home / Self Care | Payer: Medicare Other | Attending: Emergency Medicine | Admitting: Emergency Medicine

## 2020-04-05 ENCOUNTER — Emergency Department (HOSPITAL_COMMUNITY): Payer: Medicare Other

## 2020-04-05 ENCOUNTER — Encounter (HOSPITAL_COMMUNITY): Payer: Self-pay

## 2020-04-05 ENCOUNTER — Other Ambulatory Visit: Payer: Self-pay

## 2020-04-05 DIAGNOSIS — M25551 Pain in right hip: Secondary | ICD-10-CM | POA: Diagnosis not present

## 2020-04-05 DIAGNOSIS — M2578 Osteophyte, vertebrae: Secondary | ICD-10-CM | POA: Diagnosis not present

## 2020-04-05 DIAGNOSIS — R410 Disorientation, unspecified: Secondary | ICD-10-CM | POA: Diagnosis not present

## 2020-04-05 DIAGNOSIS — E039 Hypothyroidism, unspecified: Secondary | ICD-10-CM | POA: Insufficient documentation

## 2020-04-05 DIAGNOSIS — W19XXXA Unspecified fall, initial encounter: Secondary | ICD-10-CM | POA: Insufficient documentation

## 2020-04-05 DIAGNOSIS — G9389 Other specified disorders of brain: Secondary | ICD-10-CM | POA: Diagnosis not present

## 2020-04-05 DIAGNOSIS — R9082 White matter disease, unspecified: Secondary | ICD-10-CM | POA: Diagnosis not present

## 2020-04-05 DIAGNOSIS — Z7982 Long term (current) use of aspirin: Secondary | ICD-10-CM | POA: Insufficient documentation

## 2020-04-05 DIAGNOSIS — I10 Essential (primary) hypertension: Secondary | ICD-10-CM | POA: Diagnosis not present

## 2020-04-05 DIAGNOSIS — S0003XA Contusion of scalp, initial encounter: Secondary | ICD-10-CM | POA: Insufficient documentation

## 2020-04-05 DIAGNOSIS — R0902 Hypoxemia: Secondary | ICD-10-CM | POA: Diagnosis not present

## 2020-04-05 DIAGNOSIS — M4312 Spondylolisthesis, cervical region: Secondary | ICD-10-CM | POA: Diagnosis not present

## 2020-04-05 DIAGNOSIS — S0990XA Unspecified injury of head, initial encounter: Secondary | ICD-10-CM | POA: Diagnosis not present

## 2020-04-05 DIAGNOSIS — M5030 Other cervical disc degeneration, unspecified cervical region: Secondary | ICD-10-CM | POA: Diagnosis not present

## 2020-04-05 DIAGNOSIS — Z7401 Bed confinement status: Secondary | ICD-10-CM | POA: Diagnosis not present

## 2020-04-05 DIAGNOSIS — M25512 Pain in left shoulder: Secondary | ICD-10-CM | POA: Diagnosis not present

## 2020-04-05 DIAGNOSIS — R519 Headache, unspecified: Secondary | ICD-10-CM | POA: Diagnosis not present

## 2020-04-05 DIAGNOSIS — S199XXA Unspecified injury of neck, initial encounter: Secondary | ICD-10-CM | POA: Diagnosis not present

## 2020-04-05 DIAGNOSIS — M2550 Pain in unspecified joint: Secondary | ICD-10-CM | POA: Diagnosis not present

## 2020-04-05 LAB — URINALYSIS, ROUTINE W REFLEX MICROSCOPIC
Bilirubin Urine: NEGATIVE
Glucose, UA: NEGATIVE mg/dL
Hgb urine dipstick: NEGATIVE
Ketones, ur: NEGATIVE mg/dL
Leukocytes,Ua: NEGATIVE
Nitrite: NEGATIVE
Protein, ur: NEGATIVE mg/dL
Specific Gravity, Urine: 1.011 (ref 1.005–1.030)
pH: 7 (ref 5.0–8.0)

## 2020-04-05 LAB — BASIC METABOLIC PANEL
Anion gap: 8 (ref 5–15)
BUN: 17 mg/dL (ref 8–23)
CO2: 29 mmol/L (ref 22–32)
Calcium: 9.6 mg/dL (ref 8.9–10.3)
Chloride: 103 mmol/L (ref 98–111)
Creatinine, Ser: 0.86 mg/dL (ref 0.44–1.00)
GFR, Estimated: >60 mL/min (ref >60)
Glucose, Bld: 112 mg/dL — ABNORMAL HIGH (ref 70–99)
Potassium: 4.2 mmol/L (ref 3.5–5.1)
Sodium: 140 mmol/L (ref 135–145)

## 2020-04-05 LAB — CBC WITH DIFFERENTIAL/PLATELET
Abs Immature Granulocytes: 0.09 10*3/uL — ABNORMAL HIGH (ref 0.00–0.07)
Basophils Absolute: 0.1 10*3/uL (ref 0.0–0.1)
Basophils Relative: 0 %
Eosinophils Absolute: 0.1 10*3/uL (ref 0.0–0.5)
Eosinophils Relative: 0 %
HCT: 43 % (ref 36.0–46.0)
Hemoglobin: 14.4 g/dL (ref 12.0–15.0)
Immature Granulocytes: 1 %
Lymphocytes Relative: 7 %
Lymphs Abs: 1.2 10*3/uL (ref 0.7–4.0)
MCH: 35 pg — ABNORMAL HIGH (ref 26.0–34.0)
MCHC: 33.5 g/dL (ref 30.0–36.0)
MCV: 104.4 fL — ABNORMAL HIGH (ref 80.0–100.0)
Monocytes Absolute: 1.1 10*3/uL — ABNORMAL HIGH (ref 0.1–1.0)
Monocytes Relative: 7 %
Neutro Abs: 13.3 10*3/uL — ABNORMAL HIGH (ref 1.7–7.7)
Neutrophils Relative %: 85 %
Platelets: 152 10*3/uL (ref 150–400)
RBC: 4.12 MIL/uL (ref 3.87–5.11)
RDW: 13.2 % (ref 11.5–15.5)
WBC: 15.7 10*3/uL — ABNORMAL HIGH (ref 4.0–10.5)
nRBC: 0 % (ref 0.0–0.2)

## 2020-04-05 NOTE — ED Provider Notes (Signed)
[facility] EMERGENCY DEPARTMENT Provider Note  CSN: [id number] Arrival date & time: 04/05/20 5732    History Chief Complaint  Patient presents with  . Fall    HPI  Vanessa [name] is a 87 y.o. female brought to the ED via EMS from [facility] where she had an unwitnessed fall. Patient is unable to tell me details of how she fell. She is complaining of a headache. In triage she reported R shoulder and L hip pain but to me she is complaining of L shoulder and R hip pain. She otherwise states she has been doing well lately but cannot give any further details. She has a history of some memory problems.    Past Medical History:  Diagnosis Date  . Abdominal bloating   . Atrial fibrillation ([place])   . Bruises easily   . Carotid artery occlusion    LEFT  . Cricopharyngeal dysphagia   . Dizziness   . DOE (dyspnea on exertion) 04/02/2014  . Fall at home Sept 2013, Dec. 2013  Jun 08, 2012  . GERD (gastroesophageal reflux disease) 02/11/2015  . Headache(784.0)   . Hoarseness   . Hypercholesterolemia   . Hyperglycemia 04/02/2014   Glucose 178 mg percent on 01/22/2014 04/05/14 Hgb A1c 6.6 Diet controlled.    . Hypertensive retinopathy    OU  . Hypothyroidism 04/15/2015  . Macular degeneration    Dry OU  . Neuropathy    PERIPHERAL  . Pruritus   . Scoliosis   . Varicose veins   . Zenker's diverticulum     Past Surgical History:  Procedure Laterality Date  . ABDOMINAL HYSTERECTOMY  1954  . CATARACT EXTRACTION Bilateral   . CHOLECYSTECTOMY  1997  . ESOPHAGOGASTRODUODENOSCOPY N/A 03/30/2019   Procedure: ESOPHAGOGASTRODUODENOSCOPY (EGD);  Surgeon: Jerene Bears, MD;  Location: [facility];  Service: Gastroenterology;  Laterality: N/A;  . ESOPHAGOGASTRODUODENOSCOPY (EGD) WITH PROPOFOL N/A 11/25/2016   Procedure: ESOPHAGOGASTRODUODENOSCOPY (EGD) WITH PROPOFOL;  Surgeon: [name], MD;  Location: [facility];  Service: Endoscopy;  Laterality: N/A;  . EYE SURGERY  Bilateral    Cat Sx  . SPINE SURGERY  1997   correct scoliosis    Family History  Adopted: Yes  Problem Relation Age of Onset  . Diabetes Mother   . Heart attack Mother   . Heart disease Mother        After age 68  . Heart attack Father   . Stroke Father   . Breast cancer Sister   . Heart disease Maternal Grandmother   . Cancer Son        adopted son. esophagus, stomach, liver  . Stomach cancer Neg Hx   . Colon cancer Neg Hx     Social History   Tobacco Use  . Smoking status: Never Smoker  . Smokeless tobacco: Never Used  Vaping Use  . Vaping Use: Never used  Substance Use Topics  . Alcohol use: No    Alcohol/week: 0.0 standard drinks  . Drug use: No     Home Medications Prior to Admission medications   Medication Sig Start Date End Date Taking? Authorizing Provider  acetaminophen (TYLENOL) 325 MG tablet Take 650 mg by mouth every 8 (eight) hours as needed.    [provider]  acetaminophen (TYLENOL) 500 MG tablet Take 500 mg by mouth 2 (two) times daily.    [provider]  Artificial Saliva [facility]) PACK Use as directed 1 Package in the mouth or throat. mix  with 42mL's of H20; swish and spit BID; may keep in bathroom; may self administer.    [provider]  aspirin 81 MG chewable tablet Chew 81 mg by mouth daily.    [provider]  atenolol (TENORMIN) 25 MG tablet Take 12.5 mg by mouth 2 (two) times daily. Hold if SBP < 100 or HR < 60    [provider]  benzocaine-menthol (CHLORAEPTIC) 6-10 MG lozenge Take 1 lozenge by mouth every 2 (two) hours as needed for sore throat.    [provider]  Biotin 5000 MCG TABS Take 5,000 mcg by mouth daily.    [provider]  Calcium-Vitamin D-Vitamin K (VIACTIV PO) Take 1 tablet by mouth daily. 02/28/18   [provider]  famotidine (PEPCID) 20 MG tablet Take 20 mg by mouth daily.    [provider]  fluticasone (FLONASE) 50 MCG/ACT nasal spray  Place 2 sprays into both nostrils daily.    [provider]  hydrogen peroxide 1.5 % SOLN Use 15 ml orally, swish and spit as needed for dry mouth.    [provider]  ipratropium (ATROVENT) 0.06 % nasal spray Place 2 sprays into both nostrils 2 (two) times daily as needed for rhinitis.    [provider]  lactose free nutrition (BOOST PLUS) LIQD Take 237 mLs by mouth 2 (two) times daily with a meal. Per request of resident, states can't eat if takes it later    [provider]  lidocaine-prilocaine (EMLA) cream Apply 1 application topically daily as needed.    [provider]  loratadine (CLARITIN) 10 MG tablet Take 10 mg by mouth daily as needed for allergies.    [provider]  Menthol, Topical Analgesic, 4 % GEL Apply 1 application topically every 8 (eight) hours. Apply to LLE    [provider]  ondansetron (ZOFRAN) 4 MG tablet Take 4 mg by mouth every 6 (six) hours as needed for nausea or vomiting.    [provider]  Peppermint Oil (IBGARD) 90 MG CPCR Take 2 capsules by mouth 2 (two) times daily.  09/26/18   [provider]  polyethylene glycol (MIRALAX / GLYCOLAX) packet Take 8.5-17 g by mouth See admin instructions. 1/2 dose QAM scheduled, 1 dose qd prn    [provider]  pregabalin (LYRICA) 25 MG capsule Take 1 capsule (25 mg total) by mouth 2 (two) times daily. 03/18/20   Mast, Man X, NP  pregabalin (LYRICA) 50 MG capsule Take 25 mg by mouth at bedtime.    [provider]  Propylene Glycol (SYSTANE BALANCE) 0.6 % SOLN Give 1 drop to both eyes once daily as needed for dryness    [provider]  Riboflavin (VITAMIN B-2) 25 MG TABS Take 1 tablet by mouth daily.    [provider]  sennosides-docusate sodium (SENOKOT-S) 8.6-50 MG tablet Take 2 tablets by mouth daily.     [provider]  sodium phosphate (FLEET) 7-19 GM/118ML ENEM Place 1 enema rectally as needed for  severe constipation. Give Fleet enema per rectum X 1 after removing hard stool or if soft stool is present in rectum.    [provider]  traMADol (ULTRAM) 50 MG tablet Take 50 mg by mouth daily as needed.     [provider]  traMADol (ULTRAM) 50 MG tablet Take 1 tablet (50 mg total) by mouth 2 (two) times daily. 03/11/20   [name], MD  triamcinolone cream (KENALOG) 0.1 %  Apply 1 application topically as needed. Apply to itchy areas as needed. May keep in room and self apply    [provider]     Allergies    Patient has no known allergies.   Review of Systems   Review of Systems Unable to assess due to mental status.    Physical Exam BP 137/81   Pulse 63   Temp (!) 97.5 F (36.4 C) (Oral)   Resp 15   SpO2 90%   Physical Exam Vitals and nursing note reviewed.  Constitutional:      Appearance: Normal appearance.  HENT:     Head: Normocephalic.     Comments: Hematoma midline parietal scalp    Nose: Nose normal.     Mouth/Throat:     Mouth: Mucous membranes are moist.  Eyes:     Extraocular Movements: Extraocular movements intact.     Conjunctiva/sclera: Conjunctivae normal.  Neck:     Comments: In collar Cardiovascular:     Rate and Rhythm: Normal rate.  Pulmonary:     Effort: Pulmonary effort is normal.     Breath sounds: Normal breath sounds.  Abdominal:     General: Abdomen is flat.     Palpations: Abdomen is soft.     Tenderness: There is no abdominal tenderness.  Musculoskeletal:        General: Tenderness (L posterior shoulder and R lateral hip) present. No swelling or deformity. Normal range of motion.  Skin:    General: Skin is warm and dry.  Neurological:     General: No focal deficit present.     Mental Status: She is alert.  Psychiatric:        Mood and Affect: Mood normal.      ED Results / Procedures / Treatments   Labs (all labs ordered are listed, but only abnormal results are displayed) Labs Reviewed   BASIC METABOLIC PANEL - Abnormal; Notable for the following components:      Result Value   Glucose, Bld 112 (*)    All other components within normal limits  CBC WITH DIFFERENTIAL/PLATELET - Abnormal; Notable for the following components:   WBC 15.7 (*)    MCV 104.4 (*)    MCH 35.0 (*)    Neutro Abs 13.3 (*)    Monocytes Absolute 1.1 (*)    Abs Immature Granulocytes 0.09 (*)    All other components within normal limits  URINALYSIS, ROUTINE W REFLEX MICROSCOPIC    EKG None  Radiology CT Head Wo Contrast  Result Date: 04/05/2020 CLINICAL DATA:  Unwitnessed fall, hematoma to the scalp EXAM: CT HEAD WITHOUT CONTRAST CT CERVICAL SPINE WITHOUT CONTRAST TECHNIQUE: Multidetector CT imaging of the head and cervical spine was performed following the standard protocol without intravenous contrast. Multiplanar CT image reconstructions of the cervical spine were also generated. COMPARISON:  02/10/2020 FINDINGS: CT HEAD FINDINGS Brain: No evidence of acute infarction, hemorrhage, hydrocephalus, extra-axial collection or mass lesion/mass effect. Periventricular and deep white matter hypodensity. Encephalomalacia of the inferior left occipital lobe (series 6, image 9). Vascular: No hyperdense vessel or unexpected calcification. Skull: Normal. Negative for fracture or focal lesion. Sinuses/Orbits: No acute finding. Other: Hematoma of the left scalp vertex (series 9, image 46). CT CERVICAL SPINE FINDINGS Alignment: Degenerative straightening of the normal cervical lordosis with multilevel degenerative listhesis throughout. Skull base and vertebrae: No acute fracture. No primary bone lesion or focal pathologic process. Soft tissues and spinal canal: No prevertebral fluid or swelling. No visible canal hematoma.  Disc levels: Moderate to severe multilevel disc space height loss and osteophytosis, worst from C5 through C7 Upper chest: Negative. Other: None. IMPRESSION: 1. No acute intracranial pathology.  Small-vessel white matter disease and encephalomalacia of the inferior left occipital lobe. 2. Hematoma of the left scalp vertex. 3. No fracture or static subluxation of the cervical spine. 4. Moderate to severe multilevel disc degenerative disease of the cervical spine with multilevel degenerative listhesis. Electronically Signed   By: Eddie Candle M.D.   On: 04/05/2020 09:55   CT Cervical Spine Wo Contrast  Result Date: 04/05/2020 CLINICAL DATA:  Unwitnessed fall, hematoma to the scalp EXAM: CT HEAD WITHOUT CONTRAST CT CERVICAL SPINE WITHOUT CONTRAST TECHNIQUE: Multidetector CT imaging of the head and cervical spine was performed following the standard protocol without intravenous contrast. Multiplanar CT image reconstructions of the cervical spine were also generated. COMPARISON:  02/10/2020 FINDINGS: CT HEAD FINDINGS Brain: No evidence of acute infarction, hemorrhage, hydrocephalus, extra-axial collection or mass lesion/mass effect. Periventricular and deep white matter hypodensity. Encephalomalacia of the inferior left occipital lobe (series 6, image 9). Vascular: No hyperdense vessel or unexpected calcification. Skull: Normal. Negative for fracture or focal lesion. Sinuses/Orbits: No acute finding. Other: Hematoma of the left scalp vertex (series 9, image 46). CT CERVICAL SPINE FINDINGS Alignment: Degenerative straightening of the normal cervical lordosis with multilevel degenerative listhesis throughout. Skull base and vertebrae: No acute fracture. No primary bone lesion or focal pathologic process. Soft tissues and spinal canal: No prevertebral fluid or swelling. No visible canal hematoma. Disc levels: Moderate to severe multilevel disc space height loss and osteophytosis, worst from C5 through C7 Upper chest: Negative. Other: None. IMPRESSION: 1. No acute intracranial pathology. Small-vessel white matter disease and encephalomalacia of the inferior left occipital lobe. 2. Hematoma of the left scalp  vertex. 3. No fracture or static subluxation of the cervical spine. 4. Moderate to severe multilevel disc degenerative disease of the cervical spine with multilevel degenerative listhesis. Electronically Signed   By: Eddie Candle M.D.   On: 04/05/2020 09:55   DG Shoulder Left  Result Date: 04/05/2020 CLINICAL DATA:  Fall with shoulder pain EXAM: LEFT SHOULDER - 2+ VIEW COMPARISON:  Chest x-ray 03/30/2019 FINDINGS: There is no evidence of fracture or dislocation. Remote appearing left rib fractures. Generalized osteopenia. IMPRESSION: No acute finding. Generalized osteopenia. Electronically Signed   By: Monte Fantasia M.D.   On: 04/05/2020 09:58   DG Hip Unilat With Pelvis 2-3 Views Right  Result Date: 04/05/2020 CLINICAL DATA:  Fall with right hip pain EXAM: DG HIP (WITH OR WITHOUT PELVIS) 2-3V RIGHT COMPARISON:  None. FINDINGS: There is no evidence of hip fracture or dislocation. No evidence of pelvic ring fracture or diastasis. Lumbar to sacral fusion. IMPRESSION: No acute finding. Electronically Signed   By: Monte Fantasia M.D.   On: 04/05/2020 09:57    Procedures Procedures  Medications Ordered in the ED Medications - No data to display   MDM Rules/Calculators/A&P MDM Patient with fall, will send for imaging. No other acute medical complaints at this time.   ED Course  I have reviewed the triage vital signs and the nursing notes.  Pertinent labs & imaging results that were available during my care of the patient were reviewed by me and considered in my medical decision making (see chart for details).  Clinical Course as of 04/05/20 1324  Sat Apr 05, 2020  1006 Xray and CT neg for acute intracranial or bony injuries.  [CS]  4650 Patient's collar  removed. Patient's caregiver is at bedside, states she has not been eating well recently and has been unsteady on her feet. Will check labs to ensure no elyte abnormalities or anemia.  [CS]  1594 CBC with mild leukocytosis.  [CS]  7076  BMP is normal.  [CS]  1518 UA is negative.  [CS]  3437 Labs unremarkable. Will return to SNF.  [CS]    Clinical Course User Index [CS] Truddie Hidden, MD    Final Clinical Impression(s) / ED Diagnoses Final diagnoses:  Fall, initial encounter  Injury of head, initial encounter    Rx / DC Orders ED Discharge Orders    None       Truddie Hidden, MD 04/05/20 1324

## 2020-04-05 NOTE — ED Triage Notes (Signed)
Per EMS from [facility], fell today, unwitnessed. Golf ball size hematoma on back left side of head. Complaining of right shoulder and left hip pain. No LOC , no blood thinners, remembers falling. 152/96 P 84 R18 Spo2 92 , no symptoms

## 2020-04-05 NOTE — ED Notes (Signed)
Pt. Had some applesauce and water.

## 2020-04-07 ENCOUNTER — Encounter: Payer: Self-pay | Admitting: Nurse Practitioner

## 2020-04-07 ENCOUNTER — Non-Acute Institutional Stay: Payer: Medicare Other | Admitting: Nurse Practitioner

## 2020-04-07 DIAGNOSIS — H53133 Sudden visual loss, bilateral: Secondary | ICD-10-CM

## 2020-04-07 DIAGNOSIS — R1310 Dysphagia, unspecified: Secondary | ICD-10-CM

## 2020-04-07 DIAGNOSIS — K5900 Constipation, unspecified: Secondary | ICD-10-CM | POA: Diagnosis not present

## 2020-04-07 DIAGNOSIS — M159 Polyosteoarthritis, unspecified: Secondary | ICD-10-CM

## 2020-04-07 DIAGNOSIS — J383 Other diseases of vocal cords: Secondary | ICD-10-CM

## 2020-04-07 DIAGNOSIS — W19XXXA Unspecified fall, initial encounter: Secondary | ICD-10-CM

## 2020-04-07 DIAGNOSIS — N1831 Chronic kidney disease, stage 3a: Secondary | ICD-10-CM

## 2020-04-07 DIAGNOSIS — D72829 Elevated white blood cell count, unspecified: Secondary | ICD-10-CM

## 2020-04-07 DIAGNOSIS — I6523 Occlusion and stenosis of bilateral carotid arteries: Secondary | ICD-10-CM

## 2020-04-07 DIAGNOSIS — F0150 Vascular dementia without behavioral disturbance: Secondary | ICD-10-CM | POA: Diagnosis not present

## 2020-04-07 DIAGNOSIS — I4891 Unspecified atrial fibrillation: Secondary | ICD-10-CM | POA: Diagnosis not present

## 2020-04-07 DIAGNOSIS — S0003XD Contusion of scalp, subsequent encounter: Secondary | ICD-10-CM

## 2020-04-07 DIAGNOSIS — G629 Polyneuropathy, unspecified: Secondary | ICD-10-CM

## 2020-04-07 NOTE — Progress Notes (Signed)
Location:    [facility] Room Number: 1 Place of Service:  ALF [phone]) Provider:  Marda Stalker, Lennie Odor NP  [name], MD  Patient Care Team: [name], MD as PCP - General (Internal Medicine) Angelia Mould, MD as Attending Physician (Vascular Surgery) [name], MD (Inactive) as Attending Physician (Obstetrics and Gynecology) [name], MD (Orthopedic Surgery) [name], MD (Ophthalmology) [name], MD as Consulting Physician (Urology) [name], MD as Consulting Physician (Cardiology) [name], PA-C as Physician Assistant (Otolaryngology) [name], MD as Consulting Physician (Otolaryngology) [name], MD as Consulting Physician (Dermatology) [name], MD as Consulting Physician (Physical Medicine and Rehabilitation) [name], MD as Attending Physician (Gastroenterology) Isobel Eisenhuth X, NP as Nurse Practitioner (Internal Medicine) Kathrynn Ducking, MD as Consulting Physician (Neurology) Debara Pickett Nadean Corwin, MD as Consulting Physician (Cardiology) Druscilla Brownie, MD as Consulting Physician (Dermatology) [name], [name], NP as Nurse Practitioner (Family Medicine)  Extended Emergency Contact Information Primary Emergency Contact: [name] of [place] Phone: [phone] Mobile Phone: [phone] Relation: Son Secondary Emergency Contact: Serita Grit [place] of [place] Phone: [phone] Mobile Phone: [phone] Relation: Other  Code Status:  DNR Goals of care: Advanced Directive information Advanced Directives 04/05/2020  Does Patient Have a Medical Advance Directive? Yes  Type of Advance Directive Out of facility DNR (pink MOST or yellow form)  Does patient want to make changes to medical advance directive? -  Copy of [place] in Chart? -  Would patient like information on creating a medical advance directive? -  Pre-existing  out of facility DNR order (yellow form or pink MOST form) Yellow form placed in chart (order not valid for inpatient use)     Chief Complaint  Patient presents with  . Acute Visit    s/p ED eval for fall    HPI:  Pt is a 87 y.o. female seen today for an acute visit for f/u s/p fall, ED eval 04/05/20, hematoma left fore head/superior parietal hematomas, also c/o the right wrist pain, mild swelling, no decreased ROM. Had CT head/C spine, X-ray R hip, L shoulder, CBC, BMP, UA unremarkable, except wbc 15.7 04/05/20   Loss, vision, sudden, bilateral resolved after a few minutes w/o intervention, TIA vs MD            Carotid stenosis 2016 US carotid artery:Left totally occluded. Right 40% occlusion. 02/15/20 Carotid R+L artery US(last done 2014) showed no significnt stenosis of the internal carotid artery.              Vascular dementia ([place]) CT head 02/10/20 chronic small vessel ischemic disease, no acute intracranial process.            Neuropathy, takesLyrica            Dysphagia, s/p dilatation food impaction, f/u GI, takes Famotidine 20mg  qd            Atrial fibrillation with RVR ([place]), takes Atenolol 12.5mg  bid. ASA 81mg  qd.EKG 02/11/20 Afib            Constipation, takes IB Gard, MiraLax prn, Senokot S II qd            Dysphonia,  chronic, ENT            Osteoarthritis, takes Tylenol 500mg  bid, prn/bidTramadol.            CKD (chronic kidney disease) stage 3, GFR 30-59 ml/min ([place]), Bun/creat 17/0.86 04/05/20  Past  Medical History:  Diagnosis Date  . Abdominal bloating   . Atrial fibrillation ([place])   . Bruises easily   . Carotid artery occlusion    LEFT  . Cricopharyngeal dysphagia   . Dizziness   . DOE (dyspnea on exertion) 04/02/2014  . Fall at home Sept 2013, Dec. 2013  Jun 08, 2012  . GERD (gastroesophageal reflux disease) 02/11/2015  . Headache(784.0)   . Hoarseness   . Hypercholesterolemia   . Hyperglycemia 04/02/2014   Glucose 178 mg percent on 01/22/2014  04/05/14 Hgb A1c 6.6 Diet controlled.    . Hypertensive retinopathy    OU  . Hypothyroidism 04/15/2015  . Macular degeneration    Dry OU  . Neuropathy    PERIPHERAL  . Pruritus   . Scoliosis   . Varicose veins   . Zenker's diverticulum    Past Surgical History:  Procedure Laterality Date  . ABDOMINAL HYSTERECTOMY  1954  . CATARACT EXTRACTION Bilateral   . CHOLECYSTECTOMY  1997  . ESOPHAGOGASTRODUODENOSCOPY N/A 03/30/2019   Procedure: ESOPHAGOGASTRODUODENOSCOPY (EGD);  Surgeon: Jerene Bears, MD;  Location: [facility];  Service: Gastroenterology;  Laterality: N/A;  . ESOPHAGOGASTRODUODENOSCOPY (EGD) WITH PROPOFOL N/A 11/25/2016   Procedure: ESOPHAGOGASTRODUODENOSCOPY (EGD) WITH PROPOFOL;  Surgeon: [name], MD;  Location: [facility];  Service: Endoscopy;  Laterality: N/A;  . EYE SURGERY Bilateral    Cat Sx  . SPINE SURGERY  1997   correct scoliosis    No Known Allergies  Allergies as of 04/07/2020   No Known Allergies     Medication List       Accurate as of April 07, 2020 11:59 PM. If you have any questions, ask your nurse or doctor.        STOP taking these medications   hydrogen peroxide 1.5 % Soln Stopped by: Jermey Closs X Simpson Paulos, NP   ipratropium 0.06 % nasal spray Commonly known as: ATROVENT Stopped by: Isaias Dowson X Kianni Lheureux, NP   lidocaine-prilocaine cream Commonly known as: EMLA Stopped by: Kaicee Scarpino X Solimar Maiden, NP   ondansetron 4 MG tablet Commonly known as: ZOFRAN Stopped by: Claudett Bayly X Rashawnda Gaba, NP     TAKE these medications   acetaminophen 500 MG tablet Commonly known as: TYLENOL Take 500 mg by mouth 2 (two) times daily.   acetaminophen 325 MG tablet Commonly known as: TYLENOL Take 650 mg by mouth every 8 (eight) hours as needed.   aspirin 81 MG chewable tablet Chew 81 mg by mouth daily.   atenolol 25 MG tablet Commonly known as: TENORMIN Take 12.5 mg by mouth 2 (two) times daily. Hold if SBP < 100 or HR < 60   benzocaine-menthol 6-10 MG lozenge Commonly known as:  CHLORAEPTIC Take 1 lozenge by mouth every 2 (two) hours as needed for sore throat.   Biotin 5000 MCG Tabs Take 5,000 mcg by mouth daily.   famotidine 20 MG tablet Commonly known as: PEPCID Take 20 mg by mouth daily.   fluticasone 50 MCG/ACT nasal spray Commonly known as: FLONASE Place 2 sprays into both nostrils daily.   IBgard 90 MG Cpcr Generic drug: Peppermint Oil Take 2 capsules by mouth 2 (two) times daily.   lactose free nutrition Liqd Take 237 mLs by mouth 2 (two) times daily with a meal. Per request of resident, states can't eat if takes it later   loratadine 10 MG tablet Commonly known as: CLARITIN Take 10 mg by mouth daily as needed for allergies.   Menthol (Topical Analgesic) 4 % Gel  Apply 1 application topically every 8 (eight) hours. Apply to LLE   polyethylene glycol 17 g packet Commonly known as: MIRALAX / GLYCOLAX Take 8.5-17 g by mouth See admin instructions. 1/2 dose QAM scheduled, 1 dose qd prn   pregabalin 50 MG capsule Commonly known as: LYRICA Take 25 mg by mouth at bedtime.   pregabalin 25 MG capsule Commonly known as: LYRICA Take 1 capsule (25 mg total) by mouth 2 (two) times daily.   SalivaMAX Pack Use as directed 1 Package in the mouth or throat. mix with 8mL's of H20; swish and spit BID; may keep in bathroom; may self administer.   sennosides-docusate sodium 8.6-50 MG tablet Commonly known as: SENOKOT-S Take 2 tablets by mouth daily.   sodium phosphate 7-19 GM/118ML Enem Place 1 enema rectally as needed for severe constipation. Give Fleet enema per rectum X 1 after removing hard stool or if soft stool is present in rectum.   Systane Balance 0.6 % Soln Generic drug: Propylene Glycol Give 1 drop to both eyes once daily as needed for dryness   traMADol 50 MG tablet Commonly known as: ULTRAM Take 1 tablet (50 mg total) by mouth 2 (two) times daily. What changed: Another medication with the same name was removed. Continue taking this  medication, and follow the directions you see here. Changed by: Karlyn Glasco X Yahshua Thibault, NP   triamcinolone 0.1 % Commonly known as: KENALOG Apply 1 application topically as needed. Apply to itchy areas as needed. May keep in room and self apply   VIACTIV PO Take 1 tablet by mouth daily.   Vitamin B-2 25 MG Tabs Take 1 tablet by mouth daily.       Review of Systems  Constitutional: Positive for fatigue. Negative for appetite change and fever.  HENT: Positive for hearing loss and trouble swallowing. Negative for congestion, mouth sores, sore throat and voice change.   Eyes: Positive for visual disturbance. Negative for pain.       Minimal vision of the right eye  Respiratory: Negative for cough and shortness of breath.   Cardiovascular: Negative for leg swelling.  Gastrointestinal: Negative for abdominal pain, constipation, nausea and vomiting.  Genitourinary: Negative for dysuria, hematuria and urgency.       New onset urinary incontinence.   Musculoskeletal: Positive for arthralgias, back pain and gait problem.       Right wrist, hip pain.   Skin: Positive for wound. Negative for color change.  Neurological: Negative for facial asymmetry, speech difficulty, weakness, light-headedness and headaches.  Psychiatric/Behavioral: Negative for behavioral problems and sleep disturbance. The patient is not nervous/anxious.     Immunization History  Administered Date(s) Administered  . Influenza Split 10/20/2011, 11/03/2012  . Influenza, High Dose Seasonal PF 10/24/2018  . Influenza,inj,Quad PF,6+ Mos 09/29/2012, 11/09/2013  . Influenza-Unspecified 10/17/2014, 10/23/2015, 10/20/2016, 10/17/2017, 10/16/2019  . Moderna Sars-Covid-2 Vaccination 01/15/2019, 02/12/2019, 11/26/2019  . PPD Test 08/21/2013, 03/04/2014  . Pneumococcal Conjugate-13 08/21/2013  . Pneumococcal Polysaccharide-23 09/08/2016  . Tdap 05/20/2008  . Tetanus 11/02/2018  . Zoster 11/01/2012   Pertinent  Health Maintenance Due   Topic Date Due  . FOOT EXAM  Never done  . OPHTHALMOLOGY EXAM  Never done  . URINE MICROALBUMIN  Never done  . HEMOGLOBIN A1C  06/28/2019  . INFLUENZA VACCINE  Completed  . DEXA SCAN  Completed  . PNA vac Low Risk Adult  Completed   Fall Risk  11/04/2017 09/05/2017 09/22/2016 08/25/2016 05/31/2016  Falls in the past year? Yes Yes  Yes Yes No  Number falls in past yr: 2 or more 2 or more 1 2 or more -  Comment - - - - -  Injury with Fall? Yes Yes Yes Yes -  Comment - - fx 3 ribs - -  Risk Factor Category  - - - - -  Risk for fall due to : - - Impaired mobility;Impaired balance/gait - -  Risk for fall due to: Comment - - - - -   Functional Status Survey:    Vitals:   04/07/20 1429  BP: 129/70  Pulse: 69  Resp: 20  Temp: 97.7 F (36.5 C)  SpO2: 96%  Weight: 84 lb 6.4 oz (38.3 kg)  Height: 5\' 2"  (1.575 m)   Body mass index is 15.44 kg/m. Physical Exam Vitals and nursing note reviewed.  Constitutional:      Appearance: Normal appearance.  HENT:     Head: Normocephalic and atraumatic.     Nose: Nose normal.     Mouth/Throat:     Mouth: Mucous membranes are moist.  Eyes:     General: Vision grossly intact. Gaze aligned appropriately. No allergic shiner or visual field deficit.       Right eye: No foreign body or hordeolum.        Left eye: No foreign body or hordeolum.     Extraocular Movements: Extraocular movements intact.     Conjunctiva/sclera: Conjunctivae normal.     Right eye: Right conjunctiva is not injected. No exudate or hemorrhage.    Left eye: Left conjunctiva is not injected. No exudate or hemorrhage.    Pupils: Pupils are equal, round, and reactive to light.  Cardiovascular:     Rate and Rhythm: Normal rate. Rhythm irregular.     Heart sounds: No murmur heard.   Pulmonary:     Effort: Pulmonary effort is normal.     Breath sounds: No rales.  Abdominal:     General: Bowel sounds are normal.     Palpations: Abdomen is soft.     Tenderness: There is  no abdominal tenderness.  Musculoskeletal:     Cervical back: Normal range of motion and neck supple.     Right lower leg: No edema.     Left lower leg: No edema.  Skin:    General: Skin is warm and dry.     Findings: Bruising present.     Comments: Ecchymoses BUE. Hematomas left forehead, left superior parietal. R hip, R wrist pain with ROM or weight bearing, but no decreased ROM.   Neurological:     General: No focal deficit present.     Mental Status: She is alert. Mental status is at baseline.     Gait: Gait abnormal.     Comments: Oriented to person, place.   Psychiatric:        Mood and Affect: Mood normal.        Behavior: Behavior normal.        Thought Content: Thought content normal.     Labs reviewed: Recent Labs    01/21/20 0000 02/10/20 1329 02/10/20 1330 04/05/20 1025  NA 141 138 137 140  K 4.8 4.3 4.2 4.2  CL 104 102 99 103  CO2 30*  --  28 29  GLUCOSE  --  124* 127* 112*  BUN 25* 25* 20 17  CREATININE 1.0 1.00 1.06* 0.86  CALCIUM 9.2  --  9.6 9.6   Recent Labs    09/03/19 0000 01/21/20 0000 02/10/20  1330  AST 20 20 27   ALT 11 12 19   ALKPHOS 62 69 62  BILITOT  --   --  0.7  PROT  --   --  6.7  ALBUMIN 3.3* 3.3* 3.6   Recent Labs    01/21/20 0000 02/10/20 1329 02/10/20 1330 04/05/20 1025  WBC 6.8  --  10.1 15.7*  NEUTROABS 3,808.00  --  7.0 13.3*  HGB 13.6 14.3 13.8 14.4  HCT 39 42.0 41.0 43.0  MCV  --   --  104.6* 104.4*  PLT 135*  --  171 152   Lab Results  Component Value Date   TSH 2.68 09/03/2019   Lab Results  Component Value Date   HGBA1C 5.8 12/28/2018   Lab Results  Component Value Date   CHOL 187 11/03/2017   HDL 84 (A) 11/03/2017   LDLCALC 80 11/03/2017   TRIG 136 11/03/2017   CHOLHDL 2.1 07/29/2016    Significant Diagnostic Results in last 30 days:  CT Head Wo Contrast  Result Date: 04/05/2020 CLINICAL DATA:  Unwitnessed fall, hematoma to the scalp EXAM: CT HEAD WITHOUT CONTRAST CT CERVICAL SPINE WITHOUT  CONTRAST TECHNIQUE: Multidetector CT imaging of the head and cervical spine was performed following the standard protocol without intravenous contrast. Multiplanar CT image reconstructions of the cervical spine were also generated. COMPARISON:  02/10/2020 FINDINGS: CT HEAD FINDINGS Brain: No evidence of acute infarction, hemorrhage, hydrocephalus, extra-axial collection or mass lesion/mass effect. Periventricular and deep white matter hypodensity. Encephalomalacia of the inferior left occipital lobe (series 6, image 9). Vascular: No hyperdense vessel or unexpected calcification. Skull: Normal. Negative for fracture or focal lesion. Sinuses/Orbits: No acute finding. Other: Hematoma of the left scalp vertex (series 9, image 46). CT CERVICAL SPINE FINDINGS Alignment: Degenerative straightening of the normal cervical lordosis with multilevel degenerative listhesis throughout. Skull base and vertebrae: No acute fracture. No primary bone lesion or focal pathologic process. Soft tissues and spinal canal: No prevertebral fluid or swelling. No visible canal hematoma. Disc levels: Moderate to severe multilevel disc space height loss and osteophytosis, worst from C5 through C7 Upper chest: Negative. Other: None. IMPRESSION: 1. No acute intracranial pathology. Small-vessel white matter disease and encephalomalacia of the inferior left occipital lobe. 2. Hematoma of the left scalp vertex. 3. No fracture or static subluxation of the cervical spine. 4. Moderate to severe multilevel disc degenerative disease of the cervical spine with multilevel degenerative listhesis. Electronically Signed   By: Eddie Candle M.D.   On: 04/05/2020 09:55   CT Cervical Spine Wo Contrast  Result Date: 04/05/2020 CLINICAL DATA:  Unwitnessed fall, hematoma to the scalp EXAM: CT HEAD WITHOUT CONTRAST CT CERVICAL SPINE WITHOUT CONTRAST TECHNIQUE: Multidetector CT imaging of the head and cervical spine was performed following the standard protocol  without intravenous contrast. Multiplanar CT image reconstructions of the cervical spine were also generated. COMPARISON:  02/10/2020 FINDINGS: CT HEAD FINDINGS Brain: No evidence of acute infarction, hemorrhage, hydrocephalus, extra-axial collection or mass lesion/mass effect. Periventricular and deep white matter hypodensity. Encephalomalacia of the inferior left occipital lobe (series 6, image 9). Vascular: No hyperdense vessel or unexpected calcification. Skull: Normal. Negative for fracture or focal lesion. Sinuses/Orbits: No acute finding. Other: Hematoma of the left scalp vertex (series 9, image 46). CT CERVICAL SPINE FINDINGS Alignment: Degenerative straightening of the normal cervical lordosis with multilevel degenerative listhesis throughout. Skull base and vertebrae: No acute fracture. No primary bone lesion or focal pathologic process. Soft tissues and spinal canal: No prevertebral fluid or  swelling. No visible canal hematoma. Disc levels: Moderate to severe multilevel disc space height loss and osteophytosis, worst from C5 through C7 Upper chest: Negative. Other: None. IMPRESSION: 1. No acute intracranial pathology. Small-vessel white matter disease and encephalomalacia of the inferior left occipital lobe. 2. Hematoma of the left scalp vertex. 3. No fracture or static subluxation of the cervical spine. 4. Moderate to severe multilevel disc degenerative disease of the cervical spine with multilevel degenerative listhesis. Electronically Signed   By: Eddie Candle M.D.   On: 04/05/2020 09:55   DG Shoulder Left  Result Date: 04/05/2020 CLINICAL DATA:  Fall with shoulder pain EXAM: LEFT SHOULDER - 2+ VIEW COMPARISON:  Chest x-ray 03/30/2019 FINDINGS: There is no evidence of fracture or dislocation. Remote appearing left rib fractures. Generalized osteopenia. IMPRESSION: No acute finding. Generalized osteopenia. Electronically Signed   By: Monte Fantasia M.D.   On: 04/05/2020 09:58   Intravitreal  Injection, Pharmacologic Agent - OD - Right Eye  Result Date: 04/02/2020 Time Out 04/02/2020. 3:16 PM. Confirmed correct patient, procedure, site, and patient consented. Anesthesia Topical anesthesia was used. Anesthetic medications included Lidocaine 2%. Procedure Preparation included 5% betadine to ocular surface, eyelid speculum. A (32g) needle was used. Injection: 1.25 mg Bevacizumab (AVASTIN) 1.25mg /0.74mL SOLN   NDC: 68341-962-22, Lot: [id number], Expiration date: 05/20/2020   Route: Intravitreal, Site: Right Eye, Waste: 0.05 mL Post-op Post injection exam found visual acuity of at least counting fingers. The patient tolerated the procedure well. There were no complications. The patient received written and verbal post procedure care education.   OCT, Retina - OU - Both Eyes  Result Date: 04/02/2020 Right Eye Quality was good. Central Foveal Thickness: 1006. Progression has been stable. Findings include retinal drusen , inner retinal atrophy, intraretinal hyper-reflective material, abnormal foveal contour, subretinal fluid, outer retinal atrophy, intraretinal fluid (Persistent Massive central CME). Left Eye Quality was good. Central Foveal Thickness: 218. Progression has been stable. Findings include normal foveal contour, no SRF, no IRF, retinal drusen , outer retinal atrophy (Mild patchy ORA; no significant change from prior). Notes *Images captured and stored on drive Diagnosis / Impression: non-exu ARMD OU OD: CRVO; Persistent Massive central CME OS: NFP; no IRF/SRF; No significant change from prior. Clinical management: See below Abbreviations: NFP - Normal foveal profile. CME - cystoid macular edema. PED - pigment epithelial detachment. IRF - intraretinal fluid. SRF - subretinal fluid. EZ - ellipsoid zone. ERM - epiretinal membrane. ORA - outer retinal atrophy. ORT - outer retinal tubulation. SRHM - subretinal hyper-reflective material   DG Hip Unilat With Pelvis 2-3 Views Right  Result Date:  04/05/2020 CLINICAL DATA:  Fall with right hip pain EXAM: DG HIP (WITH OR WITHOUT PELVIS) 2-3V RIGHT COMPARISON:  None. FINDINGS: There is no evidence of hip fracture or dislocation. No evidence of pelvic ring fracture or diastasis. Lumbar to sacral fusion. IMPRESSION: No acute finding. Electronically Signed   By: Monte Fantasia M.D.   On: 04/05/2020 09:57    Assessment/Plan Leukocytosis Will repeat CBC/diff, monitor for s/s of infection.   Fall Needs assistance, supervision for transfer, walker.   Scalp hematoma, subsequent encounter s/p fall, ED eval 04/05/20, hematoma left fore head/superior parietal hematomas, also c/o the right wrist pain, mild swelling, no decreased ROM. Had CT head/C spine, X-ray R hip, L shoulder, CBC, BMP, UA unremarkable, except wbc 15.7 04/05/20 May consider X-ray R wrist is pain persists.   Loss, vision, sudden, bilateral  resolved after a few minutes w/o intervention, TIA vs MD  Carotid stenosis 2016 US carotid artery:Left totally occluded. Right 40% occlusion. 02/15/20 Carotid R+L artery US(last done 2014) showed no significnt stenosis of the internal carotid artery.    Vascular dementia ([place]) CT head 02/10/20 chronic small vessel ischemic disease, no acute intracranial process.   Neuropathy Pain is managed with Lyrica  Dysphagia s/p dilatation food impaction, f/u GI, takes Famotidine 20mg  qd   Atrial fibrillation with RVR ([facility]) takes Atenolol 12.5mg  bid. ASA 81mg  qd.EKG 02/11/20 Afib   Constipation takes IB Gard, MiraLax prn, Senokot S II qd   Atrophy of vocal cord chronic, ENT   Osteoarthritis takes Tylenol 500mg  bid, prn/bidTramadol.   CKD (chronic kidney disease) stage 3, GFR 30-59 ml/min ([facility]) Bun/creat 17/0.86 04/05/20    Family/ staff Communication: plan of care reviewed with the patient and charge nurse.   Labs/tests ordered:  CBC/diff  Time spend 40 minutes.

## 2020-04-08 ENCOUNTER — Encounter: Payer: Self-pay | Admitting: Nurse Practitioner

## 2020-04-08 DIAGNOSIS — M6281 Muscle weakness (generalized): Secondary | ICD-10-CM | POA: Diagnosis not present

## 2020-04-08 DIAGNOSIS — R41841 Cognitive communication deficit: Secondary | ICD-10-CM | POA: Diagnosis not present

## 2020-04-08 DIAGNOSIS — R260 Ataxic gait: Secondary | ICD-10-CM | POA: Diagnosis not present

## 2020-04-08 DIAGNOSIS — R2681 Unsteadiness on feet: Secondary | ICD-10-CM | POA: Diagnosis not present

## 2020-04-08 DIAGNOSIS — D72829 Elevated white blood cell count, unspecified: Secondary | ICD-10-CM | POA: Insufficient documentation

## 2020-04-08 NOTE — Assessment & Plan Note (Signed)
takes IB Gard, MiraLax prn, Senokot S II qd

## 2020-04-08 NOTE — Assessment & Plan Note (Signed)
Bun/creat 17/0.86 04/05/20

## 2020-04-08 NOTE — Assessment & Plan Note (Signed)
takes Tylenol 500mg  bid, prn/bidTramadol.

## 2020-04-08 NOTE — Assessment & Plan Note (Signed)
CT head 02/10/20 chronic small vessel ischemic disease, no acute intracranial process.

## 2020-04-08 NOTE — Assessment & Plan Note (Signed)
2016 US carotid artery:Left totally occluded. Right 40% occlusion. 02/15/20 Carotid R+L artery US(last done 2014) showed no significnt stenosis of the internal carotid artery.

## 2020-04-08 NOTE — Assessment & Plan Note (Signed)
s/p fall, ED eval 04/05/20, hematoma left fore head/superior parietal hematomas, also c/o the right wrist pain, mild swelling, no decreased ROM. Had CT head/C spine, X-ray R hip, L shoulder, CBC, BMP, UA unremarkable, except wbc 15.7 04/05/20 May consider X-ray R wrist is pain persists.

## 2020-04-08 NOTE — Assessment & Plan Note (Signed)
chronic, ENT

## 2020-04-08 NOTE — Assessment & Plan Note (Signed)
Pain is managed with Lyrica

## 2020-04-08 NOTE — Assessment & Plan Note (Signed)
Needs assistance, supervision for transfer, walker.

## 2020-04-08 NOTE — Assessment & Plan Note (Signed)
resolved after a few minutes w/o intervention, TIA vs MD

## 2020-04-08 NOTE — Assessment & Plan Note (Signed)
takes Atenolol 12.5mg  bid. ASA 81mg  qd.EKG 02/11/20 Afib

## 2020-04-08 NOTE — Assessment & Plan Note (Signed)
Will repeat CBC/diff, monitor for s/s of infection.

## 2020-04-08 NOTE — Assessment & Plan Note (Signed)
s/p dilatation food impaction, f/u GI, takes Famotidine 20mg  qd

## 2020-04-09 DIAGNOSIS — R2681 Unsteadiness on feet: Secondary | ICD-10-CM | POA: Diagnosis not present

## 2020-04-09 DIAGNOSIS — R260 Ataxic gait: Secondary | ICD-10-CM | POA: Diagnosis not present

## 2020-04-09 DIAGNOSIS — M6281 Muscle weakness (generalized): Secondary | ICD-10-CM | POA: Diagnosis not present

## 2020-04-09 DIAGNOSIS — R41841 Cognitive communication deficit: Secondary | ICD-10-CM | POA: Diagnosis not present

## 2020-04-10 DIAGNOSIS — D649 Anemia, unspecified: Secondary | ICD-10-CM | POA: Diagnosis not present

## 2020-04-10 LAB — CBC AND DIFFERENTIAL
HCT: 43 (ref 36–46)
Hemoglobin: 14.6 (ref 12.0–16.0)
Neutrophils Absolute: 8046
Platelets: 228 (ref 150–399)
WBC: 10.8

## 2020-04-10 LAB — CBC: RBC: 4.26 (ref 3.87–5.11)

## 2020-04-18 DIAGNOSIS — R2689 Other abnormalities of gait and mobility: Secondary | ICD-10-CM | POA: Diagnosis not present

## 2020-04-18 DIAGNOSIS — R4789 Other speech disturbances: Secondary | ICD-10-CM | POA: Diagnosis not present

## 2020-04-18 DIAGNOSIS — F0150 Vascular dementia without behavioral disturbance: Secondary | ICD-10-CM | POA: Diagnosis not present

## 2020-04-18 DIAGNOSIS — M6281 Muscle weakness (generalized): Secondary | ICD-10-CM | POA: Diagnosis not present

## 2020-04-18 DIAGNOSIS — R2681 Unsteadiness on feet: Secondary | ICD-10-CM | POA: Diagnosis not present

## 2020-04-18 DIAGNOSIS — I4891 Unspecified atrial fibrillation: Secondary | ICD-10-CM | POA: Diagnosis not present

## 2020-04-18 DIAGNOSIS — R296 Repeated falls: Secondary | ICD-10-CM | POA: Diagnosis not present

## 2020-04-20 DIAGNOSIS — R4789 Other speech disturbances: Secondary | ICD-10-CM | POA: Diagnosis not present

## 2020-04-20 DIAGNOSIS — R2681 Unsteadiness on feet: Secondary | ICD-10-CM | POA: Diagnosis not present

## 2020-04-20 DIAGNOSIS — F0150 Vascular dementia without behavioral disturbance: Secondary | ICD-10-CM | POA: Diagnosis not present

## 2020-04-20 DIAGNOSIS — I4891 Unspecified atrial fibrillation: Secondary | ICD-10-CM | POA: Diagnosis not present

## 2020-04-20 DIAGNOSIS — R296 Repeated falls: Secondary | ICD-10-CM | POA: Diagnosis not present

## 2020-04-20 DIAGNOSIS — M6281 Muscle weakness (generalized): Secondary | ICD-10-CM | POA: Diagnosis not present

## 2020-04-21 DIAGNOSIS — M6281 Muscle weakness (generalized): Secondary | ICD-10-CM | POA: Diagnosis not present

## 2020-04-21 DIAGNOSIS — F0150 Vascular dementia without behavioral disturbance: Secondary | ICD-10-CM | POA: Diagnosis not present

## 2020-04-21 DIAGNOSIS — R4789 Other speech disturbances: Secondary | ICD-10-CM | POA: Diagnosis not present

## 2020-04-21 DIAGNOSIS — R296 Repeated falls: Secondary | ICD-10-CM | POA: Diagnosis not present

## 2020-04-21 DIAGNOSIS — I4891 Unspecified atrial fibrillation: Secondary | ICD-10-CM | POA: Diagnosis not present

## 2020-04-21 DIAGNOSIS — R2681 Unsteadiness on feet: Secondary | ICD-10-CM | POA: Diagnosis not present

## 2020-04-22 DIAGNOSIS — R2681 Unsteadiness on feet: Secondary | ICD-10-CM | POA: Diagnosis not present

## 2020-04-22 DIAGNOSIS — M6281 Muscle weakness (generalized): Secondary | ICD-10-CM | POA: Diagnosis not present

## 2020-04-22 DIAGNOSIS — F0150 Vascular dementia without behavioral disturbance: Secondary | ICD-10-CM | POA: Diagnosis not present

## 2020-04-22 DIAGNOSIS — I4891 Unspecified atrial fibrillation: Secondary | ICD-10-CM | POA: Diagnosis not present

## 2020-04-22 DIAGNOSIS — R4789 Other speech disturbances: Secondary | ICD-10-CM | POA: Diagnosis not present

## 2020-04-22 DIAGNOSIS — R296 Repeated falls: Secondary | ICD-10-CM | POA: Diagnosis not present

## 2020-04-23 DIAGNOSIS — I4891 Unspecified atrial fibrillation: Secondary | ICD-10-CM | POA: Diagnosis not present

## 2020-04-23 DIAGNOSIS — M6281 Muscle weakness (generalized): Secondary | ICD-10-CM | POA: Diagnosis not present

## 2020-04-23 DIAGNOSIS — R2681 Unsteadiness on feet: Secondary | ICD-10-CM | POA: Diagnosis not present

## 2020-04-23 DIAGNOSIS — R296 Repeated falls: Secondary | ICD-10-CM | POA: Diagnosis not present

## 2020-04-23 DIAGNOSIS — R4789 Other speech disturbances: Secondary | ICD-10-CM | POA: Diagnosis not present

## 2020-04-23 DIAGNOSIS — F0150 Vascular dementia without behavioral disturbance: Secondary | ICD-10-CM | POA: Diagnosis not present

## 2020-04-24 ENCOUNTER — Non-Acute Institutional Stay (SKILLED_NURSING_FACILITY): Payer: Medicare Other | Admitting: Internal Medicine

## 2020-04-24 ENCOUNTER — Encounter: Payer: Self-pay | Admitting: Internal Medicine

## 2020-04-24 DIAGNOSIS — R4789 Other speech disturbances: Secondary | ICD-10-CM | POA: Diagnosis not present

## 2020-04-24 DIAGNOSIS — G629 Polyneuropathy, unspecified: Secondary | ICD-10-CM | POA: Diagnosis not present

## 2020-04-24 DIAGNOSIS — I4891 Unspecified atrial fibrillation: Secondary | ICD-10-CM

## 2020-04-24 DIAGNOSIS — R1310 Dysphagia, unspecified: Secondary | ICD-10-CM | POA: Diagnosis not present

## 2020-04-24 DIAGNOSIS — F339 Major depressive disorder, recurrent, unspecified: Secondary | ICD-10-CM

## 2020-04-24 DIAGNOSIS — R490 Dysphonia: Secondary | ICD-10-CM

## 2020-04-24 DIAGNOSIS — R2681 Unsteadiness on feet: Secondary | ICD-10-CM | POA: Diagnosis not present

## 2020-04-24 DIAGNOSIS — R634 Abnormal weight loss: Secondary | ICD-10-CM

## 2020-04-24 DIAGNOSIS — R4689 Other symptoms and signs involving appearance and behavior: Secondary | ICD-10-CM

## 2020-04-24 DIAGNOSIS — M6281 Muscle weakness (generalized): Secondary | ICD-10-CM | POA: Diagnosis not present

## 2020-04-24 DIAGNOSIS — G8929 Other chronic pain: Secondary | ICD-10-CM

## 2020-04-24 DIAGNOSIS — M545 Low back pain, unspecified: Secondary | ICD-10-CM

## 2020-04-24 DIAGNOSIS — R4189 Other symptoms and signs involving cognitive functions and awareness: Secondary | ICD-10-CM

## 2020-04-24 DIAGNOSIS — F0150 Vascular dementia without behavioral disturbance: Secondary | ICD-10-CM | POA: Diagnosis not present

## 2020-04-24 DIAGNOSIS — R296 Repeated falls: Secondary | ICD-10-CM | POA: Diagnosis not present

## 2020-04-24 NOTE — Progress Notes (Signed)
Provider:  Veleta Miners MD Location:    [facility] Room Number: 5 Place of Service:  SNF (31)  PCP: [name], MD Patient Care Team: [name], MD as PCP - General (Internal Medicine) Angelia Mould, MD as Attending Physician (Vascular Surgery) [name], MD (Inactive) as Attending Physician (Obstetrics and Gynecology) [name], MD (Orthopedic Surgery) [name], MD (Ophthalmology) [name], MD as Consulting Physician (Urology) [name], MD as Consulting Physician (Cardiology) [name], PA-C as Physician Assistant (Otolaryngology) [name], MD as Consulting Physician (Otolaryngology) [name], MD as Consulting Physician (Dermatology) [name], MD as Consulting Physician (Physical Medicine and Rehabilitation) [name], MD as Attending Physician (Gastroenterology) Mast, Man X, NP as Nurse Practitioner (Internal Medicine) Kathrynn Ducking, MD as Consulting Physician (Neurology) Debara Pickett Nadean Corwin, MD as Consulting Physician (Cardiology) Druscilla Brownie, MD as Consulting Physician (Dermatology) [name], [name], NP as Nurse Practitioner (Family Medicine)  Extended Emergency Contact Information Primary Emergency Contact: [name] of [place] Phone: [phone] Mobile Phone: [phone] Relation: Son Secondary Emergency Contact: Serita Grit [place] of [place] Phone: [phone] Mobile Phone: [phone] Relation: Other  Code Status: DNR Goals of Care: Advanced Directive information Advanced Directives 04/24/2020  Does Patient Have a Medical Advance Directive? Yes  Type of Advance Directive Out of facility DNR (pink MOST or yellow form)  Does patient want to make changes to medical advance directive? -  Copy of [place] in Chart? -  Would patient like information on creating a medical advance directive? -  Pre-existing  out of facility DNR order (yellow form or pink MOST form) Yellow form placed in chart (order not valid for inpatient use)      Chief Complaint  Patient presents with  . New Admit To SNF    Admission to SNF    HPI: Patient is a 87 y.o. female seen today for admission to SNF  Patient has h/ochronic atrial fibrillation not on any coagulation, GERD, hypothyroidism, hyperlipidemia, neuropathy,,  cognitive impairment, H/o Syncope, h/o IBS and Esophageal Stenosiss/p Dilatation.  She also Has H/o Retinal Vein Occlusion with Edema S/P Vitreal Injections  Transfer from AL to SNF due to worsening Cognition and needing moire help with ADLS Also having some behaviors Issues including trying to leave the facility  Have falls Needing visits to ED She also has poor appetite. We had tried Remeron before but stopped due to Worsening Confusion Her weight is down by 5 lbs Continues to be very frail Today was c/o Depression about the move. No Other acute issues    Past Medical History:  Diagnosis Date  . Abdominal bloating   . Atrial fibrillation ([place])   . Bruises easily   . Carotid artery occlusion    LEFT  . Cricopharyngeal dysphagia   . Dizziness   . DOE (dyspnea on exertion) 04/02/2014  . Fall at home Sept 2013, Dec. 2013  Jun 08, 2012  . GERD (gastroesophageal reflux disease) 02/11/2015  . Headache(784.0)   . Hoarseness   . Hypercholesterolemia   . Hyperglycemia 04/02/2014   Glucose 178 mg percent on 01/22/2014 04/05/14 Hgb A1c 6.6 Diet controlled.    . Hypertensive retinopathy    OU  . Hypothyroidism 04/15/2015  . Macular degeneration    Dry OU  . Neuropathy    PERIPHERAL  . Pruritus   . Scoliosis   . Varicose veins   . Zenker's diverticulum    Past Surgical  History:  Procedure Laterality Date  . ABDOMINAL HYSTERECTOMY  1954  . CATARACT EXTRACTION Bilateral   . CHOLECYSTECTOMY  1997  . ESOPHAGOGASTRODUODENOSCOPY N/A 03/30/2019   Procedure: ESOPHAGOGASTRODUODENOSCOPY  (EGD);  Surgeon: Jerene Bears, MD;  Location: [facility];  Service: Gastroenterology;  Laterality: N/A;  . ESOPHAGOGASTRODUODENOSCOPY (EGD) WITH PROPOFOL N/A 11/25/2016   Procedure: ESOPHAGOGASTRODUODENOSCOPY (EGD) WITH PROPOFOL;  Surgeon: [name], MD;  Location: [facility];  Service: Endoscopy;  Laterality: N/A;  . EYE SURGERY Bilateral    Cat Sx  . Gantt   correct scoliosis    reports that she has never smoked. She has never used smokeless tobacco. She reports that she does not drink alcohol and does not use drugs. Social History   Socioeconomic History  . Marital status: Widowed    Spouse name: Not on file  . Number of children: 2  . Years of education: 12+  . Highest education level: Not on file  Occupational History  . 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 Smoking status: Never Smoker  . Smokeless tobacco: Never Used  Vaping Use  . Vaping Use: Never used  Substance and Sexual Activity  . Alcohol use: No    Alcohol/week: 0.0 standard drinks  . Drug use: No  . Sexual activity: Never    Birth control/protection: None  Other Topics Concern  . Not on file  Social History Narrative   Patient lives at [facility] 03/05/2014   Patient is a widowed.    Patient is retired.    Patient has no children but adopted twin sons.    Patient has a college degree.    Never smoked   Alcohol none   Exercise with therapy   Walks with cane   POA       Patient only drinks de-caf drinks.   Patient is right handed.   Social Determinants of Health   Financial Resource Strain: Not on file  Food Insecurity: Not on file  Transportation Needs: Not on file  Physical Activity: Not on file  Stress: Not on file  Social Connections: Not on file  Intimate Partner Violence: Not on file    Functional Status Survey:    Family History  Adopted: Yes  Problem Relation Age of Onset  . Diabetes Mother    . Heart attack Mother   . Heart disease Mother        After age 53  . Heart attack Father   . Stroke Father   . Breast cancer Sister   . Heart disease Maternal Grandmother   . Cancer Son        adopted son. esophagus, stomach, liver  . Stomach cancer Neg Hx   . Colon cancer Neg Hx     Health Maintenance  Topic Date Due  . FOOT EXAM  Never done  . OPHTHALMOLOGY EXAM  Never done  . URINE MICROALBUMIN  Never done  . HEMOGLOBIN A1C  06/28/2019  . COVID-19 Vaccine (4 - Booster for Moderna series) 05/25/2020  . INFLUENZA VACCINE  08/11/2020  . TETANUS/TDAP  11/01/2028  . DEXA SCAN  Completed  . PNA vac Low Risk Adult  Completed  . HPV VACCINES  Aged Out    No Known Allergies  Allergies as of 04/24/2020   No Known Allergies     Medication List       Accurate as of April 24, 2020  1:48 PM. If you  have any questions, ask your nurse or doctor.        STOP taking these medications   benzocaine-menthol 6-10 MG lozenge Commonly known as: CHLORAEPTIC Stopped by: [name], MD   SalivaMAX Pack Stopped by: [name], MD     TAKE these medications   acetaminophen 500 MG tablet Commonly known as: TYLENOL Take 500 mg by mouth 2 (two) times daily.   acetaminophen 325 MG tablet Commonly known as: TYLENOL Take 650 mg by mouth every 8 (eight) hours as needed.   antiseptic oral rinse Liqd 5 mLs by Mouth Rinse route in the morning and at bedtime.   aspirin 81 MG chewable tablet Chew 81 mg by mouth daily.   atenolol 25 MG tablet Commonly known as: TENORMIN Take 12.5 mg by mouth 2 (two) times daily. Hold if SBP < 100 or HR < 60   Biotin 5000 MCG Tabs Take 5,000 mcg by mouth daily.   famotidine 20 MG tablet Commonly known as: PEPCID Take 20 mg by mouth daily.   fluticasone 50 MCG/ACT nasal spray Commonly known as: FLONASE Place 2 sprays into both nostrils daily.   IBgard 90 MG Cpcr Generic drug: Peppermint Oil Take 2 capsules by mouth 2 (two) times  daily.   lactose free nutrition Liqd Take 237 mLs by mouth 2 (two) times daily with a meal. Per request of resident, states can't eat if takes it later   loratadine 10 MG tablet Commonly known as: CLARITIN Take 10 mg by mouth daily as needed for allergies.   Menthol (Topical Analgesic) 4 % Gel Apply 1 application topically every 8 (eight) hours. Apply to LLE   polyethylene glycol 17 g packet Commonly known as: MIRALAX / GLYCOLAX Take 8.5-17 g by mouth See admin instructions. 1/2 dose QAM scheduled, 1 dose qd prn   pregabalin 50 MG capsule Commonly known as: LYRICA Take 25 mg by mouth at bedtime.   pregabalin 25 MG capsule Commonly known as: LYRICA Take 1 capsule (25 mg total) by mouth 2 (two) times daily.   sennosides-docusate sodium 8.6-50 MG tablet Commonly known as: SENOKOT-S Take 2 tablets by mouth daily.   sodium phosphate 7-19 GM/118ML Enem Place 1 enema rectally as needed for severe constipation. Give Fleet enema per rectum X 1 after removing hard stool or if soft stool is present in rectum.   Systane Balance 0.6 % Soln Generic drug: Propylene Glycol Give 1 drop to both eyes once daily as needed for dryness   THERA MULTI-VITAMIN PO Take 1 tablet by mouth daily.   traMADol 50 MG tablet Commonly known as: ULTRAM Take 1 tablet (50 mg total) by mouth 2 (two) times daily.   triamcinolone cream 0.1 % Commonly known as: KENALOG Apply 1 application topically as needed. Apply to itchy areas as needed. May keep in room and self apply   VIACTIV PO Take 1 tablet by mouth daily.   Vitamin B-2 25 MG Tabs Take 1 tablet by mouth daily.   zinc oxide 20 % ointment Apply 1 application topically as needed for irritation.       Review of Systems  Constitutional: Positive for activity change and appetite change.  HENT: Negative.   Respiratory: Negative.   Cardiovascular: Negative.   Gastrointestinal: Negative.   Genitourinary: Negative.   Musculoskeletal: Positive  for gait problem.  Skin: Negative.   Neurological: Positive for weakness.  Psychiatric/Behavioral: Positive for confusion and dysphoric mood.    Vitals:   04/24/20 1151  BP: Marland Kitchen)  116/50  Pulse: 70  Resp: 16  Temp: (!) 97.3 F (36.3 C)  SpO2: 95%  Weight: 81 lb 9.6 oz (37 kg)  Height: 5\' 2"  (1.575 m)   Body mass index is 14.92 kg/m. Physical Exam  Constitutional: . Well-developed and well-nourished. Very frail HENT:  Head: Normocephalic.  Mouth/Throat: Oropharynx is clear and moist.  Eyes: Pupils are equal, round, and reactive to light.  Neck: Neck supple.  Cardiovascular: Normal rate and normal heart sounds.  No murmur heard. Pulmonary/Chest: Effort normal and breath sounds normal. No respiratory distress. No wheezes. She has no rales.  Abdominal: Soft. Bowel sounds are normal. No distension. There is no tenderness. There is no rebound.  Musculoskeletal: No edema.  Lymphadenopathy: none Neurological: No Focal Deficts Skin: Skin is warm and dry.  Psychiatric: Normal mood and affect. Behavior is normal. Thought content normal.    Labs reviewed: Basic Metabolic Panel: Recent Labs    01/21/20 0000 02/10/20 1329 02/10/20 1330 04/05/20 1025  NA 141 138 137 140  K 4.8 4.3 4.2 4.2  CL 104 102 99 103  CO2 30*  --  28 29  GLUCOSE  --  124* 127* 112*  BUN 25* 25* 20 17  CREATININE 1.0 1.00 1.06* 0.86  CALCIUM 9.2  --  9.6 9.6   Liver Function Tests: Recent Labs    09/03/19 0000 01/21/20 0000 02/10/20 1330  AST 20 20 27   ALT 11 12 19   ALKPHOS 62 69 62  BILITOT  --   --  0.7  PROT  --   --  6.7  ALBUMIN 3.3* 3.3* 3.6   No results for input(s): LIPASE, AMYLASE in the last 8760 hours. No results for input(s): AMMONIA in the last 8760 hours. CBC: Recent Labs    02/10/20 1330 04/05/20 1025 04/10/20 0000  WBC 10.1 15.7* 10.8  NEUTROABS 7.0 13.3* 8,046.00  HGB 13.8 14.4 14.6  HCT 41.0 43.0 43  MCV 104.6* 104.4*  --   PLT 171 152 228   Cardiac Enzymes: No  results for input(s): CKTOTAL, CKMB, CKMBINDEX, TROPONINI in the last 8760 hours. BNP: Invalid input(s): POCBNP Lab Results  Component Value Date   HGBA1C 5.8 12/28/2018   Lab Results  Component Value Date   TSH 2.68 09/03/2019   Lab Results  Component Value Date   [id number] 101 04/06/2018   No results found for: FOLATE No results found for: IRON, TIBC, FERRITIN  Imaging and Procedures obtained prior to SNF admission: CT Head Wo Contrast  Result Date: 04/05/2020 CLINICAL DATA:  Unwitnessed fall, hematoma to the scalp EXAM: CT HEAD WITHOUT CONTRAST CT CERVICAL SPINE WITHOUT CONTRAST TECHNIQUE: Multidetector CT imaging of the head and cervical spine was performed following the standard protocol without intravenous contrast. Multiplanar CT image reconstructions of the cervical spine were also generated. COMPARISON:  02/10/2020 FINDINGS: CT HEAD FINDINGS Brain: No evidence of acute infarction, hemorrhage, hydrocephalus, extra-axial collection or mass lesion/mass effect. Periventricular and deep white matter hypodensity. Encephalomalacia of the inferior left occipital lobe (series 6, image 9). Vascular: No hyperdense vessel or unexpected calcification. Skull: Normal. Negative for fracture or focal lesion. Sinuses/Orbits: No acute finding. Other: Hematoma of the left scalp vertex (series 9, image 46). CT CERVICAL SPINE FINDINGS Alignment: Degenerative straightening of the normal cervical lordosis with multilevel degenerative listhesis throughout. Skull base and vertebrae: No acute fracture. No primary bone lesion or focal pathologic process. Soft tissues and spinal canal: No prevertebral fluid or swelling. No visible canal hematoma. Disc levels: Moderate  to severe multilevel disc space height loss and osteophytosis, worst from C5 through C7 Upper chest: Negative. Other: None. IMPRESSION: 1. No acute intracranial pathology. Small-vessel white matter disease and encephalomalacia of the inferior left  occipital lobe. 2. Hematoma of the left scalp vertex. 3. No fracture or static subluxation of the cervical spine. 4. Moderate to severe multilevel disc degenerative disease of the cervical spine with multilevel degenerative listhesis. Electronically Signed   By: Eddie Candle M.D.   On: 04/05/2020 09:55   CT Cervical Spine Wo Contrast  Result Date: 04/05/2020 CLINICAL DATA:  Unwitnessed fall, hematoma to the scalp EXAM: CT HEAD WITHOUT CONTRAST CT CERVICAL SPINE WITHOUT CONTRAST TECHNIQUE: Multidetector CT imaging of the head and cervical spine was performed following the standard protocol without intravenous contrast. Multiplanar CT image reconstructions of the cervical spine were also generated. COMPARISON:  02/10/2020 FINDINGS: CT HEAD FINDINGS Brain: No evidence of acute infarction, hemorrhage, hydrocephalus, extra-axial collection or mass lesion/mass effect. Periventricular and deep white matter hypodensity. Encephalomalacia of the inferior left occipital lobe (series 6, image 9). Vascular: No hyperdense vessel or unexpected calcification. Skull: Normal. Negative for fracture or focal lesion. Sinuses/Orbits: No acute finding. Other: Hematoma of the left scalp vertex (series 9, image 46). CT CERVICAL SPINE FINDINGS Alignment: Degenerative straightening of the normal cervical lordosis with multilevel degenerative listhesis throughout. Skull base and vertebrae: No acute fracture. No primary bone lesion or focal pathologic process. Soft tissues and spinal canal: No prevertebral fluid or swelling. No visible canal hematoma. Disc levels: Moderate to severe multilevel disc space height loss and osteophytosis, worst from C5 through C7 Upper chest: Negative. Other: None. IMPRESSION: 1. No acute intracranial pathology. Small-vessel white matter disease and encephalomalacia of the inferior left occipital lobe. 2. Hematoma of the left scalp vertex. 3. No fracture or static subluxation of the cervical spine. 4. Moderate  to severe multilevel disc degenerative disease of the cervical spine with multilevel degenerative listhesis. Electronically Signed   By: Eddie Candle M.D.   On: 04/05/2020 09:55   DG Shoulder Left  Result Date: 04/05/2020 CLINICAL DATA:  Fall with shoulder pain EXAM: LEFT SHOULDER - 2+ VIEW COMPARISON:  Chest x-ray 03/30/2019 FINDINGS: There is no evidence of fracture or dislocation. Remote appearing left rib fractures. Generalized osteopenia. IMPRESSION: No acute finding. Generalized osteopenia. Electronically Signed   By: Monte Fantasia M.D.   On: 04/05/2020 09:58   DG Hip Unilat With Pelvis 2-3 Views Right  Result Date: 04/05/2020 CLINICAL DATA:  Fall with right hip pain EXAM: DG HIP (WITH OR WITHOUT PELVIS) 2-3V RIGHT COMPARISON:  None. FINDINGS: There is no evidence of hip fracture or dislocation. No evidence of pelvic ring fracture or diastasis. Lumbar to sacral fusion. IMPRESSION: No acute finding. Electronically Signed   By: Monte Fantasia M.D.   On: 04/05/2020 09:57    Assessment/Plan Depression, recurrent ([place]) Will start her on Wellbutrin 75 mg BID Has failed Remeron before due to Confusion  Can try again if continues to loose weight  Neuropathy On Lyrica Dysphagia, unspecified type S/p Dilatation Doing well Atrial fibrillation with RVR ([facility]) Not on any anticoagulation due to Falls frailty and Age On Lopressor And ASpirin  Dysphonia Has seen ENT before  Cognitive and behavioral changes Now in SNF for higher level of care  Weight loss Has failed Remeron before Will try again if continues to loose weight  Chronic right-sided low back pain without sciatica On Tramadol Retinal Vein Occlusion with Edema Follows closely with Opthalmology  Constipation  with Abdominal Distension On IB Donald Prose   Family/ staff Communication:   Labs/tests ordered:  Total time spent in this patient care encounter was  45_  minutes; greater than 50% of the visit spent counseling patient  and staff, reviewing records , Labs and coordinating care for problems addressed at this encounter.

## 2020-04-25 DIAGNOSIS — R2681 Unsteadiness on feet: Secondary | ICD-10-CM | POA: Diagnosis not present

## 2020-04-25 DIAGNOSIS — F0150 Vascular dementia without behavioral disturbance: Secondary | ICD-10-CM | POA: Diagnosis not present

## 2020-04-25 DIAGNOSIS — R4789 Other speech disturbances: Secondary | ICD-10-CM | POA: Diagnosis not present

## 2020-04-25 DIAGNOSIS — M6281 Muscle weakness (generalized): Secondary | ICD-10-CM | POA: Diagnosis not present

## 2020-04-25 DIAGNOSIS — I4891 Unspecified atrial fibrillation: Secondary | ICD-10-CM | POA: Diagnosis not present

## 2020-04-25 DIAGNOSIS — R296 Repeated falls: Secondary | ICD-10-CM | POA: Diagnosis not present

## 2020-04-28 DIAGNOSIS — R2681 Unsteadiness on feet: Secondary | ICD-10-CM | POA: Diagnosis not present

## 2020-04-28 DIAGNOSIS — F0150 Vascular dementia without behavioral disturbance: Secondary | ICD-10-CM | POA: Diagnosis not present

## 2020-04-28 DIAGNOSIS — R296 Repeated falls: Secondary | ICD-10-CM | POA: Diagnosis not present

## 2020-04-28 DIAGNOSIS — M6281 Muscle weakness (generalized): Secondary | ICD-10-CM | POA: Diagnosis not present

## 2020-04-28 DIAGNOSIS — R4789 Other speech disturbances: Secondary | ICD-10-CM | POA: Diagnosis not present

## 2020-04-28 DIAGNOSIS — I4891 Unspecified atrial fibrillation: Secondary | ICD-10-CM | POA: Diagnosis not present

## 2020-04-29 DIAGNOSIS — R4789 Other speech disturbances: Secondary | ICD-10-CM | POA: Diagnosis not present

## 2020-04-29 DIAGNOSIS — R2681 Unsteadiness on feet: Secondary | ICD-10-CM | POA: Diagnosis not present

## 2020-04-29 DIAGNOSIS — F0150 Vascular dementia without behavioral disturbance: Secondary | ICD-10-CM | POA: Diagnosis not present

## 2020-04-29 DIAGNOSIS — R296 Repeated falls: Secondary | ICD-10-CM | POA: Diagnosis not present

## 2020-04-29 DIAGNOSIS — M6281 Muscle weakness (generalized): Secondary | ICD-10-CM | POA: Diagnosis not present

## 2020-04-29 DIAGNOSIS — I4891 Unspecified atrial fibrillation: Secondary | ICD-10-CM | POA: Diagnosis not present

## 2020-04-30 DIAGNOSIS — I4891 Unspecified atrial fibrillation: Secondary | ICD-10-CM | POA: Diagnosis not present

## 2020-04-30 DIAGNOSIS — F0150 Vascular dementia without behavioral disturbance: Secondary | ICD-10-CM | POA: Diagnosis not present

## 2020-04-30 DIAGNOSIS — R4789 Other speech disturbances: Secondary | ICD-10-CM | POA: Diagnosis not present

## 2020-04-30 DIAGNOSIS — M6281 Muscle weakness (generalized): Secondary | ICD-10-CM | POA: Diagnosis not present

## 2020-04-30 DIAGNOSIS — R2681 Unsteadiness on feet: Secondary | ICD-10-CM | POA: Diagnosis not present

## 2020-04-30 DIAGNOSIS — R296 Repeated falls: Secondary | ICD-10-CM | POA: Diagnosis not present

## 2020-05-01 DIAGNOSIS — R4789 Other speech disturbances: Secondary | ICD-10-CM | POA: Diagnosis not present

## 2020-05-01 DIAGNOSIS — M6281 Muscle weakness (generalized): Secondary | ICD-10-CM | POA: Diagnosis not present

## 2020-05-01 DIAGNOSIS — I4891 Unspecified atrial fibrillation: Secondary | ICD-10-CM | POA: Diagnosis not present

## 2020-05-01 DIAGNOSIS — R2681 Unsteadiness on feet: Secondary | ICD-10-CM | POA: Diagnosis not present

## 2020-05-01 DIAGNOSIS — R296 Repeated falls: Secondary | ICD-10-CM | POA: Diagnosis not present

## 2020-05-01 DIAGNOSIS — F0150 Vascular dementia without behavioral disturbance: Secondary | ICD-10-CM | POA: Diagnosis not present

## 2020-05-02 DIAGNOSIS — R296 Repeated falls: Secondary | ICD-10-CM | POA: Diagnosis not present

## 2020-05-02 DIAGNOSIS — M6281 Muscle weakness (generalized): Secondary | ICD-10-CM | POA: Diagnosis not present

## 2020-05-02 DIAGNOSIS — R4789 Other speech disturbances: Secondary | ICD-10-CM | POA: Diagnosis not present

## 2020-05-02 DIAGNOSIS — B351 Tinea unguium: Secondary | ICD-10-CM | POA: Diagnosis not present

## 2020-05-02 DIAGNOSIS — F0150 Vascular dementia without behavioral disturbance: Secondary | ICD-10-CM | POA: Diagnosis not present

## 2020-05-02 DIAGNOSIS — L602 Onychogryphosis: Secondary | ICD-10-CM | POA: Diagnosis not present

## 2020-05-02 DIAGNOSIS — I4891 Unspecified atrial fibrillation: Secondary | ICD-10-CM | POA: Diagnosis not present

## 2020-05-02 DIAGNOSIS — R2681 Unsteadiness on feet: Secondary | ICD-10-CM | POA: Diagnosis not present

## 2020-05-05 ENCOUNTER — Non-Acute Institutional Stay (SKILLED_NURSING_FACILITY): Payer: Medicare Other | Admitting: Orthopedic Surgery

## 2020-05-05 ENCOUNTER — Encounter: Payer: Self-pay | Admitting: Orthopedic Surgery

## 2020-05-05 DIAGNOSIS — F339 Major depressive disorder, recurrent, unspecified: Secondary | ICD-10-CM

## 2020-05-05 DIAGNOSIS — R4789 Other speech disturbances: Secondary | ICD-10-CM | POA: Diagnosis not present

## 2020-05-05 DIAGNOSIS — F411 Generalized anxiety disorder: Secondary | ICD-10-CM

## 2020-05-05 DIAGNOSIS — R296 Repeated falls: Secondary | ICD-10-CM | POA: Diagnosis not present

## 2020-05-05 DIAGNOSIS — R4189 Other symptoms and signs involving cognitive functions and awareness: Secondary | ICD-10-CM

## 2020-05-05 DIAGNOSIS — M6281 Muscle weakness (generalized): Secondary | ICD-10-CM | POA: Diagnosis not present

## 2020-05-05 DIAGNOSIS — I4891 Unspecified atrial fibrillation: Secondary | ICD-10-CM | POA: Diagnosis not present

## 2020-05-05 DIAGNOSIS — F0150 Vascular dementia without behavioral disturbance: Secondary | ICD-10-CM | POA: Diagnosis not present

## 2020-05-05 DIAGNOSIS — R2681 Unsteadiness on feet: Secondary | ICD-10-CM | POA: Diagnosis not present

## 2020-05-05 DIAGNOSIS — R4689 Other symptoms and signs involving appearance and behavior: Secondary | ICD-10-CM

## 2020-05-05 MED ORDER — LORAZEPAM 0.5 MG PO TABS: 0.5000 mg | ORAL_TABLET | Freq: Two times a day (BID) | ORAL | 0 refills | Status: AC | PRN

## 2020-05-05 NOTE — Progress Notes (Signed)
Location:  [place] Room Number: 5 Place of Service:  SNF [phone]) Provider: Windell Moulding, AGNP-C  [name], MD  Patient Care Team: [name], MD as PCP - General (Internal Medicine) Angelia Mould, MD as Attending Physician (Vascular Surgery) [name], MD (Inactive) as Attending Physician (Obstetrics and Gynecology) [name], MD (Orthopedic Surgery) [name], MD (Ophthalmology) [name], MD as Consulting Physician (Urology) [name], MD as Consulting Physician (Cardiology) [name], PA-C as Physician Assistant (Otolaryngology) [name], MD as Consulting Physician (Otolaryngology) [name], MD as Consulting Physician (Dermatology) [name], MD as Consulting Physician (Physical Medicine and Rehabilitation) [name], MD as Attending Physician (Gastroenterology) Mast, Man X, NP as Nurse Practitioner (Internal Medicine) Kathrynn Ducking, MD as Consulting Physician (Neurology) Debara Pickett Nadean Corwin, MD as Consulting Physician (Cardiology) Druscilla Brownie, MD as Consulting Physician (Dermatology) [name], [name], NP as Nurse Practitioner (Family Medicine)  Extended Emergency Contact Information Primary Emergency Contact: [name] of [place] Phone: [phone] Mobile Phone: [phone] Relation: Son Secondary Emergency Contact: Serita Grit [place] of [place] Phone: [phone] Mobile Phone: [phone] Relation: Other  Goals of care: Advanced Directive information Advanced Directives 04/24/2020  Does Patient Have a Medical Advance Directive? Yes  Type of Advance Directive Out of facility DNR (pink MOST or yellow form)  Does patient want to make changes to medical advance directive? -  Copy of [place] in Chart? -  Would patient like information on creating a medical advance directive? -  Pre-existing out of facility DNR  order (yellow form or pink MOST form) Yellow form placed in chart (order not valid for inpatient use)     Chief Complaint  Patient presents with  . Acute Visit    anxiety    HPI:  Pt is a 87 y.o. female seen today for acute visit for increased anxiety.   Nursing staff reports increased confusion and anxiety last two days. History of vascular dementia. Son reports she has called him late at night 04/23 and 04/242. Nursing staff states she has been up late at night, unable to be redirected. Appears more nervous.   She was recently started on Wellbutrin for depression. Son states she does not do well with antidepressants and requesting medication be removed.   Nurse does not report any other concerns, vitals stable.    Past Medical History:  Diagnosis Date  . Abdominal bloating   . Atrial fibrillation ([place])   . Bruises easily   . Carotid artery occlusion    LEFT  . Cricopharyngeal dysphagia   . Dizziness   . DOE (dyspnea on exertion) 04/02/2014  . Fall at home Sept 2013, Dec. 2013  Jun 08, 2012  . GERD (gastroesophageal reflux disease) 02/11/2015  . Headache(784.0)   . Hoarseness   . Hypercholesterolemia   . Hyperglycemia 04/02/2014   Glucose 178 mg percent on 01/22/2014 04/05/14 Hgb A1c 6.6 Diet controlled.    . Hypertensive retinopathy    OU  . Hypothyroidism 04/15/2015  . Macular degeneration    Dry OU  . Neuropathy    PERIPHERAL  . Pruritus   . Scoliosis   . Varicose veins   . Zenker's diverticulum    Past Surgical History:  Procedure Laterality Date  . ABDOMINAL HYSTERECTOMY  1954  . CATARACT EXTRACTION Bilateral   . CHOLECYSTECTOMY  1997  . ESOPHAGOGASTRODUODENOSCOPY N/A 03/30/2019   Procedure: ESOPHAGOGASTRODUODENOSCOPY (EGD);  Surgeon: Jerene Bears, MD;  Location: [facility];  Service: Gastroenterology;  Laterality: N/A;  . ESOPHAGOGASTRODUODENOSCOPY (EGD) WITH PROPOFOL N/A 11/25/2016   Procedure: ESOPHAGOGASTRODUODENOSCOPY (EGD) WITH PROPOFOL;  Surgeon:  [name], MD;  Location: [facility];  Service: Endoscopy;  Laterality: N/A;  . EYE SURGERY Bilateral    Cat Sx  . SPINE SURGERY  1997   correct scoliosis    No Known Allergies  Outpatient Encounter Medications as of 05/05/2020  Medication Sig  . acetaminophen (TYLENOL) 325 MG tablet Take 650 mg by mouth every 8 (eight) hours as needed.  Marland Kitchen acetaminophen (TYLENOL) 500 MG tablet Take 500 mg by mouth 2 (two) times daily.  Marland Kitchen antiseptic oral rinse (BIOTENE) LIQD 5 mLs by Mouth Rinse route in the morning and at bedtime.  Marland Kitchen aspirin 81 MG chewable tablet Chew 81 mg by mouth daily.  Marland Kitchen atenolol (TENORMIN) 25 MG tablet Take 12.5 mg by mouth 2 (two) times daily. Hold if SBP < 100 or HR < 60  . Biotin 5000 MCG TABS Take 5,000 mcg by mouth daily.  . Calcium-Vitamin D-Vitamin K (VIACTIV PO) Take 1 tablet by mouth daily.  . famotidine (PEPCID) 20 MG tablet Take 20 mg by mouth daily.  . fluticasone (FLONASE) 50 MCG/ACT nasal spray Place 2 sprays into both nostrils daily.  Marland Kitchen lactose free nutrition (BOOST PLUS) LIQD Take 237 mLs by mouth 2 (two) times daily with a meal. Per request of resident, states can't eat if takes it later  . loratadine (CLARITIN) 10 MG tablet Take 10 mg by mouth daily as needed for allergies.  . Menthol, Topical Analgesic, 4 % GEL Apply 1 application topically every 8 (eight) hours. Apply to LLE  . Pediatric Multiple Vitamins (THERA MULTI-VITAMIN PO) Take 1 tablet by mouth daily.  Marland Kitchen Peppermint Oil (IBGARD) 90 MG CPCR Take 2 capsules by mouth 2 (two) times daily.   . polyethylene glycol (MIRALAX / GLYCOLAX) packet Take 8.5-17 g by mouth See admin instructions. 1/2 dose QAM scheduled, 1 dose qd prn  . pregabalin (LYRICA) 25 MG capsule Take 1 capsule (25 mg total) by mouth 2 (two) times daily.  . pregabalin (LYRICA) 50 MG capsule Take 25 mg by mouth at bedtime.  Marland Kitchen Propylene Glycol (SYSTANE BALANCE) 0.6 % SOLN Give 1 drop to both eyes once daily as needed for dryness  .  Riboflavin (VITAMIN B-2) 25 MG TABS Take 1 tablet by mouth daily.  . sennosides-docusate sodium (SENOKOT-S) 8.6-50 MG tablet Take 2 tablets by mouth daily.   . sodium phosphate (FLEET) 7-19 GM/118ML ENEM Place 1 enema rectally as needed for severe constipation. Give Fleet enema per rectum X 1 after removing hard stool or if soft stool is present in rectum.  . traMADol (ULTRAM) 50 MG tablet Take 1 tablet (50 mg total) by mouth 2 (two) times daily.  Marland Kitchen triamcinolone cream (KENALOG) 0.1 % Apply 1 application topically as needed. Apply to itchy areas as needed. May keep in room and self apply  . zinc oxide 20 % ointment Apply 1 application topically as needed for irritation.   No facility-administered encounter medications on file as of 05/05/2020.    Review of Systems  Unable to perform ROS: Dementia    Immunization History  Administered Date(s) Administered  . Influenza Split 10/20/2011, 11/03/2012  . Influenza, High Dose Seasonal PF 10/24/2018  . Influenza,inj,Quad PF,6+ Mos 09/29/2012, 11/09/2013  . Influenza-Unspecified 10/17/2014, 10/23/2015, 10/20/2016, 10/17/2017, 10/16/2019  . Moderna Sars-Covid-2 Vaccination 01/15/2019, 02/12/2019, 11/26/2019  . PPD Test 08/21/2013, 03/04/2014  .  Pneumococcal Conjugate-13 08/21/2013  . Pneumococcal Polysaccharide-23 09/08/2016  . Tdap 05/20/2008  . Tetanus 11/02/2018  . Zoster 11/01/2012   Pertinent  Health Maintenance Due  Topic Date Due  . FOOT EXAM  Never done  . OPHTHALMOLOGY EXAM  Never done  . URINE MICROALBUMIN  Never done  . HEMOGLOBIN A1C  06/28/2019  . INFLUENZA VACCINE  08/11/2020  . DEXA SCAN  Completed  . PNA vac Low Risk Adult  Completed   Fall Risk  11/04/2017 09/05/2017 09/22/2016 08/25/2016 05/31/2016  Falls in the past year? Yes Yes Yes Yes No  Number falls in past yr: 2 or more 2 or more 1 2 or more -  Comment - - - - -  Injury with Fall? Yes Yes Yes Yes -  Comment - - fx 3 ribs - -  Risk Factor Category  - - - - -   Risk for fall due to : - - Impaired mobility;Impaired balance/gait - -  Risk for fall due to: Comment - - - - -   Functional Status Survey:    Vitals:   05/05/20 1605  BP: 107/60  Pulse: 70  Resp: 20  Temp: (!) 97 F (36.1 C)  SpO2: 98%  Weight: 83 lb 6.4 oz (37.8 kg)  Height: 5\' 2"  (1.575 m)   Body mass index is 15.25 kg/m. Physical Exam Vitals reviewed.  Cardiovascular:     Rate and Rhythm: Normal rate and regular rhythm.     Pulses: Normal pulses.     Heart sounds: Normal heart sounds.  Pulmonary:     Effort: Pulmonary effort is normal. No respiratory distress.     Breath sounds: Normal breath sounds. No wheezing.  Abdominal:     General: Abdomen is flat. Bowel sounds are normal. There is no distension.     Palpations: Abdomen is soft.     Tenderness: There is no abdominal tenderness.  Neurological:     General: No focal deficit present.     Mental Status: She is alert. Mental status is at baseline.     Motor: Weakness present.     Gait: Gait abnormal.  Psychiatric:        Mood and Affect: Mood normal.        Behavior: Behavior normal.        Cognition and Memory: Memory is impaired.     Labs reviewed: Recent Labs    01/21/20 0000 02/10/20 1329 02/10/20 1330 04/05/20 1025  NA 141 138 137 140  K 4.8 4.3 4.2 4.2  CL 104 102 99 103  CO2 30*  --  28 29  GLUCOSE  --  124* 127* 112*  BUN 25* 25* 20 17  CREATININE 1.0 1.00 1.06* 0.86  CALCIUM 9.2  --  9.6 9.6   Recent Labs    09/03/19 0000 01/21/20 0000 02/10/20 1330  AST 20 20 27   ALT 11 12 19   ALKPHOS 62 69 62  BILITOT  --   --  0.7  PROT  --   --  6.7  ALBUMIN 3.3* 3.3* 3.6   Recent Labs    02/10/20 1330 04/05/20 1025 04/10/20 0000  WBC 10.1 15.7* 10.8  NEUTROABS 7.0 13.3* 8,046.00  HGB 13.8 14.4 14.6  HCT 41.0 43.0 43  MCV 104.6* 104.4*  --   PLT 171 152 228   Lab Results  Component Value Date   TSH 2.68 09/03/2019   Lab Results  Component Value Date   HGBA1C 5.8 12/28/2018  Lab Results  Component Value Date   CHOL 187 11/03/2017   HDL 84 (A) 11/03/2017   LDLCALC 80 11/03/2017   TRIG 136 11/03/2017   CHOLHDL 2.1 07/29/2016    Significant Diagnostic Results in last 30 days:  No results found.  Assessment/Plan 1. Cognitive and behavioral changes -still able to ambulate, fed self, follows some commands - showing more signs of sundowning - continue snf care  2. Depression, recurrent ([place]) - failed remeron trial d/t confusion - suspect Wellbutrin is increasing anxiety and confusion - son requesting antidepressant be discontinued  3. Anxiety state - increased anxiety at night - will try ativan 0.5 mh po bid prn x 14 days for increased agitation and anxiety   Family/ staff Communication: plan discussed with son and nurse  Labs/tests ordered:  none

## 2020-05-06 DIAGNOSIS — R296 Repeated falls: Secondary | ICD-10-CM | POA: Diagnosis not present

## 2020-05-06 DIAGNOSIS — I4891 Unspecified atrial fibrillation: Secondary | ICD-10-CM | POA: Diagnosis not present

## 2020-05-06 DIAGNOSIS — R4789 Other speech disturbances: Secondary | ICD-10-CM | POA: Diagnosis not present

## 2020-05-06 DIAGNOSIS — R2681 Unsteadiness on feet: Secondary | ICD-10-CM | POA: Diagnosis not present

## 2020-05-06 DIAGNOSIS — M6281 Muscle weakness (generalized): Secondary | ICD-10-CM | POA: Diagnosis not present

## 2020-05-06 DIAGNOSIS — F0150 Vascular dementia without behavioral disturbance: Secondary | ICD-10-CM | POA: Diagnosis not present

## 2020-05-07 DIAGNOSIS — R2681 Unsteadiness on feet: Secondary | ICD-10-CM | POA: Diagnosis not present

## 2020-05-07 DIAGNOSIS — I4891 Unspecified atrial fibrillation: Secondary | ICD-10-CM | POA: Diagnosis not present

## 2020-05-07 DIAGNOSIS — F0150 Vascular dementia without behavioral disturbance: Secondary | ICD-10-CM | POA: Diagnosis not present

## 2020-05-07 DIAGNOSIS — R4789 Other speech disturbances: Secondary | ICD-10-CM | POA: Diagnosis not present

## 2020-05-07 DIAGNOSIS — M6281 Muscle weakness (generalized): Secondary | ICD-10-CM | POA: Diagnosis not present

## 2020-05-07 DIAGNOSIS — R296 Repeated falls: Secondary | ICD-10-CM | POA: Diagnosis not present

## 2020-05-08 DIAGNOSIS — F0150 Vascular dementia without behavioral disturbance: Secondary | ICD-10-CM | POA: Diagnosis not present

## 2020-05-08 DIAGNOSIS — R2681 Unsteadiness on feet: Secondary | ICD-10-CM | POA: Diagnosis not present

## 2020-05-08 DIAGNOSIS — I4891 Unspecified atrial fibrillation: Secondary | ICD-10-CM | POA: Diagnosis not present

## 2020-05-08 DIAGNOSIS — M6281 Muscle weakness (generalized): Secondary | ICD-10-CM | POA: Diagnosis not present

## 2020-05-08 DIAGNOSIS — R4789 Other speech disturbances: Secondary | ICD-10-CM | POA: Diagnosis not present

## 2020-05-08 DIAGNOSIS — R296 Repeated falls: Secondary | ICD-10-CM | POA: Diagnosis not present

## 2020-05-13 NOTE — Progress Notes (Signed)
[facility] Note  05/14/2020     CHIEF COMPLAINT Patient presents for Retina Follow Up   HISTORY OF PRESENT ILLNESS: Vanessa [name] is a 87 y.o. female who presents to the clinic today for:   HPI    Retina Follow Up    Patient presents with  CRVO/BRVO.  In right eye.  Duration of 6 weeks.  I, the attending physician,  performed the HPI with the patient and updated documentation appropriately.          Comments    6 week follow up CRVO OD- poor historian (dementia)        Last edited by Bernarda Caffey, MD on 05/16/2020 10:32 PM. (History)      Referring physician: [name], MD [place],  [place] [id number]  HISTORICAL INFORMATION:   Selected notes from the MEDICAL RECORD NUMBER Referred by Dr. [name] for concern of CME    CURRENT MEDICATIONS: Current Outpatient Medications (Ophthalmic Drugs)  Medication Sig  . Propylene Glycol (SYSTANE BALANCE) 0.6 % SOLN Give 1 drop to both eyes once daily as needed for dryness   No current facility-administered medications for this visit. (Ophthalmic Drugs)   Current Outpatient Medications (Other)  Medication Sig  . acetaminophen (TYLENOL) 325 MG tablet Take 650 mg by mouth every 8 (eight) hours as needed.  Marland Kitchen antiseptic oral rinse (BIOTENE) LIQD 5 mLs by Mouth Rinse route in the morning and at bedtime.  Marland Kitchen aspirin 81 MG chewable tablet Chew 81 mg by mouth daily.  Marland Kitchen atenolol (TENORMIN) 25 MG tablet Take 12.5 mg by mouth 2 (two) times daily. Hold if SBP < 100 or HR < 60  . Biotin 5000 MCG TABS Take 5,000 mcg by mouth daily.  . Calcium-Vitamin D-Vitamin K (VIACTIV PO) Take 1 tablet by mouth daily.  . famotidine (PEPCID) 20 MG tablet Take 20 mg by mouth daily.  . fluticasone (FLONASE) 50 MCG/ACT nasal spray Place 2 sprays into both nostrils daily.  Marland Kitchen loratadine (CLARITIN) 10 MG tablet Take 10 mg by mouth daily as needed for allergies.  Marland Kitchen LORazepam (ATIVAN) 0.5 MG tablet Take 1  tablet (0.5 mg total) by mouth 2 (two) times daily as needed for up to 14 days for anxiety.  . Menthol, Topical Analgesic, 4 % GEL Apply 1 application topically every 8 (eight) hours. Apply to LLE  . Pediatric Multiple Vitamins (THERA MULTI-VITAMIN PO) Take 1 tablet by mouth daily.  Marland Kitchen Peppermint Oil (IBGARD) 90 MG CPCR Take 2 capsules by mouth 2 (two) times daily.   . polyethylene glycol (MIRALAX / GLYCOLAX) packet Take 8.5-17 g by mouth See admin instructions. 1/2 dose QAM scheduled, 1 dose qd prn  . pregabalin (LYRICA) 25 MG capsule Take 1 capsule (25 mg total) by mouth 2 (two) times daily.  . Riboflavin (VITAMIN B-2) 25 MG TABS Take 1 tablet by mouth daily.  . sennosides-docusate sodium (SENOKOT-S) 8.6-50 MG tablet Take 2 tablets by mouth daily.   . sodium phosphate (FLEET) 7-19 GM/118ML ENEM Place 1 enema rectally as needed for severe constipation. Give Fleet enema per rectum X 1 after removing hard stool or if soft stool is present in rectum.  . traMADol (ULTRAM) 50 MG tablet Take 1 tablet (50 mg total) by mouth 2 (two) times daily.  Marland Kitchen triamcinolone cream (KENALOG) 0.1 % Apply 1 application topically as needed. Apply to itchy areas as needed. May keep in room and self apply  .  zinc oxide 20 % ointment Apply 1 application topically as needed for irritation.  Marland Kitchen acetaminophen (TYLENOL) 500 MG tablet Take 500 mg by mouth 2 (two) times daily. (Patient not taking: Reported on 05/14/2020)  . lactose free nutrition (BOOST PLUS) LIQD Take 237 mLs by mouth 2 (two) times daily with a meal. Per request of resident, states can't eat if takes it later  . pregabalin (LYRICA) 50 MG capsule Take 25 mg by mouth at bedtime. (Patient not taking: Reported on 05/14/2020)   No current facility-administered medications for this visit. (Other)      REVIEW OF SYSTEMS: ROS    Positive for: Gastrointestinal, Neurological, Genitourinary, Musculoskeletal, HENT, Cardiovascular, Eyes   Negative for: Constitutional, Skin,  Endocrine, Respiratory, Psychiatric, Allergic/Imm, Heme/Lymph   Last edited by Leonie Douglas, COA on 05/14/2020  2:12 PM. (History)       ALLERGIES No Known Allergies  PAST MEDICAL HISTORY Past Medical History:  Diagnosis Date  . Abdominal bloating   . Atrial fibrillation ([place])   . Bruises easily   . Carotid artery occlusion    LEFT  . Cricopharyngeal dysphagia   . Dizziness   . DOE (dyspnea on exertion) 04/02/2014  . Fall at home Sept 2013, Dec. 2013  Jun 08, 2012  . GERD (gastroesophageal reflux disease) 02/11/2015  . Headache(784.0)   . Hoarseness   . Hypercholesterolemia   . Hyperglycemia 04/02/2014   Glucose 178 mg percent on 01/22/2014 04/05/14 Hgb A1c 6.6 Diet controlled.    . Hypertensive retinopathy    OU  . Hypothyroidism 04/15/2015  . Macular degeneration    Dry OU  . Neuropathy    PERIPHERAL  . Pruritus   . Scoliosis   . Varicose veins   . Zenker's diverticulum    Past Surgical History:  Procedure Laterality Date  . ABDOMINAL HYSTERECTOMY  1954  . CATARACT EXTRACTION Bilateral   . CHOLECYSTECTOMY  1997  . ESOPHAGOGASTRODUODENOSCOPY N/A 03/30/2019   Procedure: ESOPHAGOGASTRODUODENOSCOPY (EGD);  Surgeon: Jerene Bears, MD;  Location: [facility];  Service: Gastroenterology;  Laterality: N/A;  . ESOPHAGOGASTRODUODENOSCOPY (EGD) WITH PROPOFOL N/A 11/25/2016   Procedure: ESOPHAGOGASTRODUODENOSCOPY (EGD) WITH PROPOFOL;  Surgeon: [name], MD;  Location: [facility];  Service: Endoscopy;  Laterality: N/A;  . EYE SURGERY Bilateral    Cat Sx  . SPINE SURGERY  1997   correct scoliosis    FAMILY HISTORY Family History  Adopted: Yes  Problem Relation Age of Onset  . Diabetes Mother   . Heart attack Mother   . Heart disease Mother        After age 88  . Heart attack Father   . Stroke Father   . Breast cancer Sister   . Heart disease Maternal Grandmother   . Cancer Son        adopted son. esophagus, stomach, liver  . Stomach cancer Neg Hx   . Colon  cancer Neg Hx     SOCIAL HISTORY Social History   Tobacco Use  . Smoking status: Never Smoker  . Smokeless tobacco: Never Used  Vaping Use  . Vaping Use: Never used  Substance Use Topics  . Alcohol use: No    Alcohol/week: 0.0 standard drinks  . Drug use: No         OPHTHALMIC EXAM:  Base Eye Exam    Visual Acuity (Snellen - Linear)      Right Left   Dist cc CF 3' 20/30   Dist ph cc  difficult  Tonometry (Tonopen, 2:24 PM)      Right Left   Pressure 16 15       Pupils      Dark Light Shape React   Right 2 1 Round Minimal   Left 2 1 Round Minimal       Visual Fields (Counting fingers)   difficult       Extraocular Movement      Right Left    Full Full       Neuro/Psych    Oriented x3: Yes   Mood/Affect: Dementia        Dilation    Both eyes: 1.0% Mydriacyl, 2.5% Phenylephrine @ 2:24 PM        Slit Lamp and Fundus Exam    Slit Lamp Exam      Right Left   Lids/Lashes Dermatochalasis - upper lid Dermatochalasis - upper lid, Meibomian gland dysfunction   Conjunctiva/Sclera White and quiet White and quiet   Cornea Arcus, 1+ diffuse Punctate epithelial erosions, mild endo pigment Arcus, 1+ Punctate epithelial erosions, mild endo pigment   Anterior Chamber deep and clear deep and clear   Iris Round and moderately dilated to 92mm, No NVI Round and moderately dilated to 33mm   Lens PC IOL in good position with open PC PC IOL in good position with open PC   Vitreous Vitreous syneresis Vitreous syneresis       Fundus Exam      Right Left   Disc Mild pallor, sharp rim, nasal hyperemia-  vascular loops superiorly Pink and Sharp   C/D Ratio 0.4 0.4   Macula Interval improvement in CME, Blunted foveal reflex, RPE mottling and clumping, scattered IRH/DBH Flat, Blunted foveal reflex, Drusen, RPE mottling and clumping, No heme or edema   Vessels Attenuated, tortuous, CRVO Vascular attenuation, Tortuous   Periphery Attached, choroidal nevus at 0930 equator  w/ overlying drusen, minimal elavation, no orange pigment, 3DD in diam, 360 DBH, scattered IRH -- improved from prior Attached, reticular degeneration, peripheral cystoid degeneration          IMAGING AND PROCEDURES  Imaging and Procedures for @TODAY @  OCT, Retina - OU - Both Eyes       Right Eye Quality was good. Central Foveal Thickness: 165. Progression has improved. Findings include retinal drusen , inner retinal atrophy, intraretinal hyper-reflective material, abnormal foveal contour, outer retinal atrophy, no SRF, no IRF (Interval improvement in massive central CME -- resolved).   Left Eye Quality was good. Central Foveal Thickness: 223. Progression has been stable. Findings include normal foveal contour, no SRF, no IRF, retinal drusen , outer retinal atrophy (Mild patchy ORA; no significant change from prior).   Notes *Images captured and stored on drive  Diagnosis / Impression:  non-exu ARMD OU OD: CRVO; Interval improvement in massive central CME -- resolved OS: NFP; no IRF/SRF; patchy ORA - stable    Clinical management:  See below  Abbreviations: NFP - Normal foveal profile. CME - cystoid macular edema. PED - pigment epithelial detachment. IRF - intraretinal fluid. SRF - subretinal fluid. EZ - ellipsoid zone. ERM - epiretinal membrane. ORA - outer retinal atrophy. ORT - outer retinal tubulation. SRHM - subretinal hyper-reflective material                 ASSESSMENT/PLAN:    ICD-10-CM   1. Central retinal vein occlusion with macular edema of right eye  H34.8110   2. Retinal edema  H35.81 OCT, Retina - OU - Both Eyes  3. Intermediate stage nonexudative age-related macular degeneration of both eyes  H35.3132   4. Essential hypertension  I10   5. Hypertensive retinopathy of both eyes  H35.033   6. Nevus of choroid of right eye  D31.31   7. Pseudophakia of both eyes  Z96.1     1,2. CRVO with CME OD  - pt lost to f/u from 04/27/19 to 02/20/20 (10 mos)  - s/p  IVA OD #1 (01.22.20), #2 (02.18.20), #3 (04.22.20),#4 (05.20.20), #5 (06.24.20), #6 (08.04.20), #7 (09.08.20), #8 (02.12.21), #9 (03.23.21), #10 (03.23.22)  - FA 2.19.2020 shows delayed filling time OD, no active leakage -- ?RAO component  - pt had massive recurrence of CME OD on 02.12.21 -- 5 mos since previous IVA OD; persistent ORA  - today, OCT shows Massive central CME resolved and exam shows intraretinal heme improved at 6 wk interval  - BCVA OD stable at CF, no improvement despite OCT improvement  - recommend holding IVA OD today, 05.04.22  - informed consent for Avastin signed and scanned on 09.08.2020 (OD)  - F/U 8 weeks -- DFE/OCT, possible injection  3. Age related macular degeneration, non-exudative, intermediate stage OU  - The incidence, anatomy, and pathology of dry AMD, risk of progression, and the AREDS and AREDS 2 study including smoking risks discussed with patient.  - recommend amsler grid monitoring  - monitor  4,5. Hypertensive retinopathy OU  - discussed importance of tight BP control  - monitor  6. Choroidal Nevus OD  - ~3DD in diameter, round, located at 0930 equator  - +drusen; no orange pigment or SRF  - minimal elevation  - baseline Optos imaging obtained 02.19.20  - optos imaging repeated 02.14.21  - stable today  - monitor  7. Pseudophakia OU  - s/p CE/IOL OU  - beautiful surgeries, doing well  - monitor   Ophthalmic Meds Ordered this visit:  No orders of the defined types were placed in this encounter.      Return in about 8 weeks (around 07/09/2020) for f/u CRVO OD, DFE, OCT.  There are no Patient Instructions on file for this visit.   Explained the diagnoses, plan, and follow up with the patient and they expressed understanding.  Patient expressed understanding of the importance of proper follow up care.   This document serves as a record of services personally performed by Gardiner Sleeper, MD, PhD. It was created on their behalf by Roselee Nova, COMT. The creation of this record is the provider's dictation and/or activities during the visit.  Electronically signed by: Roselee Nova, COMT 05/16/20 10:37 PM   This document serves as a record of services personally performed by Gardiner Sleeper, MD, PhD. It was created on their behalf by San Jetty. Owens Shark, OA an ophthalmic technician. The creation of this record is the provider's dictation and/or activities during the visit.    Electronically signed by: San Jetty. Marguerita Merles 05.04.2022 10:37 PM  Gardiner Sleeper, M.D., Ph.D. Diseases & Surgery of the Retina and Vitreous [facility]  I have reviewed the above documentation for accuracy and completeness, and I agree with the above. Gardiner Sleeper, M.D., Ph.D. 05/16/20 10:37 PM   Abbreviations: M myopia (nearsighted); A astigmatism; H hyperopia (farsighted); P presbyopia; Mrx spectacle prescription;  CTL contact lenses; OD right eye; OS left eye; OU both eyes  XT exotropia; ET esotropia; PEK punctate epithelial keratitis; PEE punctate epithelial erosions; DES dry eye syndrome; MGD meibomian gland dysfunction; ATs  artificial tears; PFAT's preservative free artificial tears; [place] nuclear sclerotic cataract; PSC posterior subcapsular cataract; ERM epi-retinal membrane; PVD posterior vitreous detachment; RD retinal detachment; DM diabetes mellitus; DR diabetic retinopathy; NPDR non-proliferative diabetic retinopathy; PDR proliferative diabetic retinopathy; CSME clinically significant macular edema; DME diabetic macular edema; dbh dot blot hemorrhages; CWS cotton wool spot; POAG primary open angle glaucoma; C/D cup-to-disc ratio; HVF humphrey visual field; GVF goldmann visual field; OCT optical coherence tomography; IOP intraocular pressure; BRVO Branch retinal vein occlusion; CRVO central retinal vein occlusion; CRAO central retinal artery occlusion; BRAO branch retinal artery occlusion; RT retinal tear; SB scleral buckle; PPV  pars plana vitrectomy; VH Vitreous hemorrhage; PRP panretinal laser photocoagulation; IVK intravitreal kenalog; VMT vitreomacular traction; MH Macular hole;  NVD neovascularization of the disc; NVE neovascularization elsewhere; AREDS age related eye disease study; ARMD age related macular degeneration; POAG primary open angle glaucoma; EBMD epithelial/anterior basement membrane dystrophy; ACIOL anterior chamber intraocular lens; IOL intraocular lens; PCIOL posterior chamber intraocular lens; Phaco/IOL phacoemulsification with intraocular lens placement; [place] photorefractive keratectomy; LASIK laser assisted in situ keratomileusis; HTN hypertension; DM diabetes mellitus; COPD chronic obstructive pulmonary disease

## 2020-05-14 ENCOUNTER — Ambulatory Visit (INDEPENDENT_AMBULATORY_CARE_PROVIDER_SITE_OTHER): Payer: Medicare Other | Admitting: Ophthalmology

## 2020-05-14 ENCOUNTER — Other Ambulatory Visit: Payer: Self-pay

## 2020-05-14 DIAGNOSIS — H353132 Nonexudative age-related macular degeneration, bilateral, intermediate dry stage: Secondary | ICD-10-CM | POA: Diagnosis not present

## 2020-05-14 DIAGNOSIS — Z961 Presence of intraocular lens: Secondary | ICD-10-CM | POA: Diagnosis not present

## 2020-05-14 DIAGNOSIS — H34811 Central retinal vein occlusion, right eye, with macular edema: Secondary | ICD-10-CM | POA: Diagnosis not present

## 2020-05-14 DIAGNOSIS — H35033 Hypertensive retinopathy, bilateral: Secondary | ICD-10-CM | POA: Diagnosis not present

## 2020-05-14 DIAGNOSIS — I10 Essential (primary) hypertension: Secondary | ICD-10-CM | POA: Diagnosis not present

## 2020-05-14 DIAGNOSIS — H3581 Retinal edema: Secondary | ICD-10-CM | POA: Diagnosis not present

## 2020-05-14 DIAGNOSIS — D3131 Benign neoplasm of right choroid: Secondary | ICD-10-CM | POA: Diagnosis not present

## 2020-05-16 ENCOUNTER — Encounter (INDEPENDENT_AMBULATORY_CARE_PROVIDER_SITE_OTHER): Payer: Self-pay | Admitting: Ophthalmology

## 2020-05-19 ENCOUNTER — Encounter: Payer: Self-pay | Admitting: Orthopedic Surgery

## 2020-05-19 ENCOUNTER — Non-Acute Institutional Stay (SKILLED_NURSING_FACILITY): Payer: Medicare Other | Admitting: Orthopedic Surgery

## 2020-05-19 DIAGNOSIS — I4891 Unspecified atrial fibrillation: Secondary | ICD-10-CM

## 2020-05-19 DIAGNOSIS — M545 Low back pain, unspecified: Secondary | ICD-10-CM

## 2020-05-19 DIAGNOSIS — R4189 Other symptoms and signs involving cognitive functions and awareness: Secondary | ICD-10-CM | POA: Diagnosis not present

## 2020-05-19 DIAGNOSIS — R634 Abnormal weight loss: Secondary | ICD-10-CM | POA: Diagnosis not present

## 2020-05-19 DIAGNOSIS — D72829 Elevated white blood cell count, unspecified: Secondary | ICD-10-CM | POA: Diagnosis not present

## 2020-05-19 DIAGNOSIS — G629 Polyneuropathy, unspecified: Secondary | ICD-10-CM

## 2020-05-19 DIAGNOSIS — R490 Dysphonia: Secondary | ICD-10-CM

## 2020-05-19 DIAGNOSIS — F411 Generalized anxiety disorder: Secondary | ICD-10-CM

## 2020-05-19 DIAGNOSIS — F339 Major depressive disorder, recurrent, unspecified: Secondary | ICD-10-CM

## 2020-05-19 DIAGNOSIS — R4689 Other symptoms and signs involving appearance and behavior: Secondary | ICD-10-CM | POA: Diagnosis not present

## 2020-05-19 DIAGNOSIS — R1310 Dysphagia, unspecified: Secondary | ICD-10-CM

## 2020-05-19 DIAGNOSIS — G8929 Other chronic pain: Secondary | ICD-10-CM

## 2020-05-19 DIAGNOSIS — H34811 Central retinal vein occlusion, right eye, with macular edema: Secondary | ICD-10-CM | POA: Diagnosis not present

## 2020-05-19 NOTE — Progress Notes (Signed)
Location:  [facility] Room Number: 5 Place of Service:  SNF [phone]) Provider: Windell Moulding, AGNP-C  [name], MD  Patient Care Team: [name], MD as PCP - General (Internal Medicine) Angelia Mould, MD as Attending Physician (Vascular Surgery) [name], MD (Inactive) as Attending Physician (Obstetrics and Gynecology) [name], MD (Orthopedic Surgery) [name], MD (Ophthalmology) [name], MD as Consulting Physician (Urology) [name], MD as Consulting Physician (Cardiology) [name], PA-C as Physician Assistant (Otolaryngology) [name], MD as Consulting Physician (Otolaryngology) [name], MD as Consulting Physician (Dermatology) [name], MD as Consulting Physician (Physical Medicine and Rehabilitation) [name], MD as Attending Physician (Gastroenterology) Mast, Man X, NP as Nurse Practitioner (Internal Medicine) Kathrynn Ducking, MD as Consulting Physician (Neurology) Debara Pickett Nadean Corwin, MD as Consulting Physician (Cardiology) Druscilla Brownie, MD as Consulting Physician (Dermatology) [name], [name], NP as Nurse Practitioner (Family Medicine)  Extended Emergency Contact Information Primary Emergency Contact: [name] of [place] Phone: [phone] Mobile Phone: [phone] Relation: Son Secondary Emergency Contact: Serita Grit [place] of [place] Phone: [phone] Mobile Phone: [phone] Relation: Other  Code Status: DNR Goals of care: Advanced Directive information Advanced Directives 04/24/2020  Does Patient Have a Medical Advance Directive? Yes  Type of Advance Directive Out of facility DNR (pink MOST or yellow form)  Does patient want to make changes to medical advance directive? -  Copy of [place] in Chart? -  Would patient like information on creating a medical advance directive? -  Pre-existing out  of facility DNR order (yellow form or pink MOST form) Yellow form placed in chart (order not valid for inpatient use)     Chief Complaint  Patient presents with  . Medical Management of Chronic Issues    HPI:  Pt is a 87 y.o. female seen today for medical management of chronic diseases.    She resides on the skilled unit at [facility] since 04/07 due to worsening cognition, frequent falls and requiring more assistance with ADLs. Since her transition to SNF from [place], she has been observed wandering the halls and known to call son numerous times in the middle of the night. Son reported she expressed anger when discussing her health and the need for current healthcare. Wellbutrin recent discontinued and ativan prn started. No recent outbursts reported. She continues to work with PT for balance, safety and transfers. At this time she is ambulating with a FWW about 150 ft with moderate assistance. No recent falls or injuries. Atrial fibrillation controlled with atenolol, anticoagulant baby asa. Neuropathy controlled with Lyrica and tramadol. Bowel movements regular with daily senna and prn miralax. Meal intake varies, averages about 50% of meals and 75-100% of Boost bid. No recent reports of acid reflux.    Today, she is very pleasant. Waiting for her hair appointment. Follows commands and can express needs.   05/04 she has her eyes examined at [facility] by Dr. Skip Estimable, advised to f/u in 8 weeks.   She has been placed on the list for dental [facility] at [facility].   Recent blood pressures are as follows:   05/09- 132/78  05/08- 145/70, 137/75  05/07- 157/88, 122/73  Recent weights are as follows:  05/04- 82 lbs  04/07- 81.6 lbs  Nurse does not report any concerns, vitals stable.   Past Medical History:  Diagnosis Date  . Abdominal bloating   . Atrial fibrillation ([place])   .  Bruises easily   . Carotid artery occlusion    LEFT  . Cricopharyngeal dysphagia   . Dizziness   .  DOE (dyspnea on exertion) 04/02/2014  . Fall at home Sept 2013, Dec. 2013  Jun 08, 2012  . GERD (gastroesophageal reflux disease) 02/11/2015  . Headache(784.0)   . Hoarseness   . Hypercholesterolemia   . Hyperglycemia 04/02/2014   Glucose 178 mg percent on 01/22/2014 04/05/14 Hgb A1c 6.6 Diet controlled.    . Hypertensive retinopathy    OU  . Hypothyroidism 04/15/2015  . Macular degeneration    Dry OU  . Neuropathy    PERIPHERAL  . Pruritus   . Scoliosis   . Varicose veins   . Zenker's diverticulum    Past Surgical History:  Procedure Laterality Date  . ABDOMINAL HYSTERECTOMY  1954  . CATARACT EXTRACTION Bilateral   . CHOLECYSTECTOMY  1997  . ESOPHAGOGASTRODUODENOSCOPY N/A 03/30/2019   Procedure: ESOPHAGOGASTRODUODENOSCOPY (EGD);  Surgeon: Jerene Bears, MD;  Location: [facility];  Service: Gastroenterology;  Laterality: N/A;  . ESOPHAGOGASTRODUODENOSCOPY (EGD) WITH PROPOFOL N/A 11/25/2016   Procedure: ESOPHAGOGASTRODUODENOSCOPY (EGD) WITH PROPOFOL;  Surgeon: [name], MD;  Location: [facility];  Service: Endoscopy;  Laterality: N/A;  . EYE SURGERY Bilateral    Cat Sx  . SPINE SURGERY  1997   correct scoliosis    No Known Allergies  Outpatient Encounter Medications as of 05/19/2020  Medication Sig  . acetaminophen (TYLENOL) 325 MG tablet Take 650 mg by mouth every 8 (eight) hours as needed.  Marland Kitchen acetaminophen (TYLENOL) 500 MG tablet Take 500 mg by mouth 2 (two) times daily. (Patient not taking: Reported on 05/14/2020)  . antiseptic oral rinse (BIOTENE) LIQD 5 mLs by Mouth Rinse route in the morning and at bedtime.  Marland Kitchen aspirin 81 MG chewable tablet Chew 81 mg by mouth daily.  Marland Kitchen atenolol (TENORMIN) 25 MG tablet Take 12.5 mg by mouth 2 (two) times daily. Hold if SBP < 100 or HR < 60  . Biotin 5000 MCG TABS Take 5,000 mcg by mouth daily.  . Calcium-Vitamin D-Vitamin K (VIACTIV PO) Take 1 tablet by mouth daily.  . famotidine (PEPCID) 20 MG tablet Take 20 mg by mouth daily.  .  fluticasone (FLONASE) 50 MCG/ACT nasal spray Place 2 sprays into both nostrils daily.  Marland Kitchen lactose free nutrition (BOOST PLUS) LIQD Take 237 mLs by mouth 2 (two) times daily with a meal. Per request of resident, states can't eat if takes it later  . loratadine (CLARITIN) 10 MG tablet Take 10 mg by mouth daily as needed for allergies.  Marland Kitchen LORazepam (ATIVAN) 0.5 MG tablet Take 1 tablet (0.5 mg total) by mouth 2 (two) times daily as needed for up to 14 days for anxiety.  . Menthol, Topical Analgesic, 4 % GEL Apply 1 application topically every 8 (eight) hours. Apply to LLE  . Pediatric Multiple Vitamins (THERA MULTI-VITAMIN PO) Take 1 tablet by mouth daily.  Marland Kitchen Peppermint Oil (IBGARD) 90 MG CPCR Take 2 capsules by mouth 2 (two) times daily.   . polyethylene glycol (MIRALAX / GLYCOLAX) packet Take 8.5-17 g by mouth See admin instructions. 1/2 dose QAM scheduled, 1 dose qd prn  . pregabalin (LYRICA) 25 MG capsule Take 1 capsule (25 mg total) by mouth 2 (two) times daily.  . pregabalin (LYRICA) 50 MG capsule Take 25 mg by mouth at bedtime. (Patient not taking: Reported on 05/14/2020)  . Propylene Glycol (SYSTANE BALANCE) 0.6 % SOLN Give 1 drop to  both eyes once daily as needed for dryness  . Riboflavin (VITAMIN B-2) 25 MG TABS Take 1 tablet by mouth daily.  . sennosides-docusate sodium (SENOKOT-S) 8.6-50 MG tablet Take 2 tablets by mouth daily.   . sodium phosphate (FLEET) 7-19 GM/118ML ENEM Place 1 enema rectally as needed for severe constipation. Give Fleet enema per rectum X 1 after removing hard stool or if soft stool is present in rectum.  . traMADol (ULTRAM) 50 MG tablet Take 1 tablet (50 mg total) by mouth 2 (two) times daily.  Marland Kitchen triamcinolone cream (KENALOG) 0.1 % Apply 1 application topically as needed. Apply to itchy areas as needed. May keep in room and self apply  . zinc oxide 20 % ointment Apply 1 application topically as needed for irritation.   No facility-administered encounter medications on  file as of 05/19/2020.    Review of Systems  Unable to perform ROS: Dementia    Immunization History  Administered Date(s) Administered  . Influenza Split 10/20/2011, 11/03/2012  . Influenza, High Dose Seasonal PF 10/24/2018  . Influenza,inj,Quad PF,6+ Mos 09/29/2012, 11/09/2013  . Influenza-Unspecified 10/17/2014, 10/23/2015, 10/20/2016, 10/17/2017, 10/16/2019  . Moderna Sars-Covid-2 Vaccination 01/15/2019, 02/12/2019, 11/26/2019  . PPD Test 08/21/2013, 03/04/2014  . Pneumococcal Conjugate-13 08/21/2013  . Pneumococcal Polysaccharide-23 09/08/2016  . Tdap 05/20/2008  . Tetanus 11/02/2018  . Zoster 11/01/2012   Pertinent  Health Maintenance Due  Topic Date Due  . FOOT EXAM  Never done  . OPHTHALMOLOGY EXAM  Never done  . URINE MICROALBUMIN  Never done  . HEMOGLOBIN A1C  06/28/2019  . INFLUENZA VACCINE  08/11/2020  . DEXA SCAN  Completed  . PNA vac Low Risk Adult  Completed   Fall Risk  11/04/2017 09/05/2017 09/22/2016 08/25/2016 05/31/2016  Falls in the past year? Yes Yes Yes Yes No  Number falls in past yr: 2 or more 2 or more 1 2 or more -  Comment - - - - -  Injury with Fall? Yes Yes Yes Yes -  Comment - - fx 3 ribs - -  Risk Factor Category  - - - - -  Risk for fall due to : - - Impaired mobility;Impaired balance/gait - -  Risk for fall due to: Comment - - - - -   Functional Status Survey:    Vitals:   05/19/20 2051  BP: 132/78  Pulse: 74  Resp: 20  Temp: (!) 97.5 F (36.4 C)  SpO2: 93%  Weight: 82 lb (37.2 kg)  Height: 5\' 2"  (1.575 m)   Body mass index is 15 kg/m. Physical Exam Vitals reviewed.  Constitutional:      General: She is not in acute distress. HENT:     Head: Normocephalic.     Right Ear: There is no impacted cerumen.     Left Ear: There is no impacted cerumen.     Nose: Nose normal.     Mouth/Throat:     Mouth: Mucous membranes are moist.  Eyes:     General:        Right eye: No discharge.        Left eye: No discharge.   Cardiovascular:     Rate and Rhythm: Normal rate. Rhythm irregular.     Pulses: Normal pulses.     Heart sounds: Normal heart sounds. No murmur heard.   Pulmonary:     Effort: Pulmonary effort is normal.     Breath sounds: Normal breath sounds.  Abdominal:     General: Bowel  sounds are normal. There is no distension.     Palpations: Abdomen is soft.     Tenderness: There is no abdominal tenderness.  Musculoskeletal:     Cervical back: Normal range of motion.     Right lower leg: No edema.     Left lower leg: No edema.  Lymphadenopathy:     Cervical: No cervical adenopathy.  Skin:    General: Skin is warm and dry.     Capillary Refill: Capillary refill takes less than 2 seconds.  Neurological:     General: No focal deficit present.     Mental Status: She is alert. Mental status is at baseline.     Motor: Weakness present.     Gait: Gait abnormal.     Comments: walker  Psychiatric:        Mood and Affect: Mood normal.        Behavior: Behavior normal.        Cognition and Memory: Memory is impaired.     Labs reviewed: Recent Labs    01/21/20 0000 02/10/20 1329 02/10/20 1330 04/05/20 1025  NA 141 138 137 140  K 4.8 4.3 4.2 4.2  CL 104 102 99 103  CO2 30*  --  28 29  GLUCOSE  --  124* 127* 112*  BUN 25* 25* 20 17  CREATININE 1.0 1.00 1.06* 0.86  CALCIUM 9.2  --  9.6 9.6   Recent Labs    09/03/19 0000 01/21/20 0000 02/10/20 1330  AST 20 20 27   ALT 11 12 19   ALKPHOS 62 69 62  BILITOT  --   --  0.7  PROT  --   --  6.7  ALBUMIN 3.3* 3.3* 3.6   Recent Labs    02/10/20 1330 04/05/20 1025 04/10/20 0000  WBC 10.1 15.7* 10.8  NEUTROABS 7.0 13.3* 8,046.00  HGB 13.8 14.4 14.6  HCT 41.0 43.0 43  MCV 104.6* 104.4*  --   PLT 171 152 228   Lab Results  Component Value Date   TSH 2.68 09/03/2019   Lab Results  Component Value Date   HGBA1C 5.8 12/28/2018   Lab Results  Component Value Date   CHOL 187 11/03/2017   HDL 84 (A) 11/03/2017   LDLCALC 80  11/03/2017   TRIG 136 11/03/2017   CHOLHDL 2.1 07/29/2016    Significant Diagnostic Results in last 30 days:  OCT, Retina - OU - Both Eyes  Result Date: 05/16/2020 Right Eye Quality was good. Central Foveal Thickness: 165. Progression has improved. Findings include retinal drusen , inner retinal atrophy, intraretinal hyper-reflective material, abnormal foveal contour, outer retinal atrophy, no SRF, no IRF (Interval improvement in massive central CME -- resolved). Left Eye Quality was good. Central Foveal Thickness: 223. Progression has been stable. Findings include normal foveal contour, no SRF, no IRF, retinal drusen , outer retinal atrophy (Mild patchy ORA; no significant change from prior). Notes *Images captured and stored on drive Diagnosis / Impression: non-exu ARMD OU OD: CRVO; Interval improvement in massive central CME -- resolved OS: NFP; no IRF/SRF; patchy ORA - stable Clinical management: See below Abbreviations: NFP - Normal foveal profile. CME - cystoid macular edema. PED - pigment epithelial detachment. IRF - intraretinal fluid. SRF - subretinal fluid. EZ - ellipsoid zone. ERM - epiretinal membrane. ORA - outer retinal atrophy. ORT - outer retinal tubulation. SRHM - subretinal hyper-reflective material    Assessment/Plan 1. Cognitive and behavioral changes - unsuccessful trials with remeron and wellbutrin - cont  ativan 0.5 bid prn  2. Depression, recurrent ([place]) - see above - cont lyrica  3. Anxiety state - stable with prn ativan  4. Neuropathy - cont Lyrica  5. Dysphagia, unspecified type - no recent aspirations - stable with mechanical soft diet and thin liquids  6. Atrial fibrillation with RVR ([facility]) - rate controlled with atenolol - cont baby asa  - remains poor candidate for DOAC due to cognition and falls  7. Weight loss - maintained weight within past month - cont Boost bid  8. Dysphonia - seen by ENT in past, no recent changes to voice  9. Chronic  right-sided low back pain without sciatica - stable with Tramadol - cont PT  10. Central retinal vein occlusion with macular edema of right eye - followed by Dr. Coralyn Pear- scheduled to f/u in 8 weeks  11. Leukocytosis, unspecified - repeat cbc/diff   Family/ staff Communication: plan discussed with nurse  Labs/tests ordered:  Cbc/diff

## 2020-05-22 DIAGNOSIS — N183 Chronic kidney disease, stage 3 unspecified: Secondary | ICD-10-CM | POA: Diagnosis not present

## 2020-05-22 DIAGNOSIS — I4891 Unspecified atrial fibrillation: Secondary | ICD-10-CM | POA: Diagnosis not present

## 2020-05-22 LAB — CBC AND DIFFERENTIAL
HCT: 38 (ref 36–46)
Hemoglobin: 12.8 (ref 12.0–16.0)
Neutrophils Absolute: 5159
Platelets: 202 (ref 150–399)
WBC: 7.7

## 2020-05-22 LAB — CBC: RBC: 3.75 — AB (ref 3.87–5.11)

## 2020-05-28 ENCOUNTER — Encounter (HOSPITAL_COMMUNITY): Payer: Self-pay | Admitting: Internal Medicine

## 2020-05-28 ENCOUNTER — Emergency Department (HOSPITAL_COMMUNITY): Payer: Medicare Other

## 2020-05-28 ENCOUNTER — Other Ambulatory Visit: Payer: Self-pay

## 2020-05-28 ENCOUNTER — Inpatient Hospital Stay (HOSPITAL_COMMUNITY): Payer: Medicare Other

## 2020-05-28 ENCOUNTER — Inpatient Hospital Stay (HOSPITAL_COMMUNITY)
Admission: EM | Admit: 2020-05-28 | Discharge: 2020-06-01 | DRG: 521 | Disposition: A | Discharge status: Skilled Nursing Facility | Payer: Medicare Other | Source: Skilled Nursing Facility | Attending: Internal Medicine | Admitting: Internal Medicine

## 2020-05-28 DIAGNOSIS — Y92122 Bedroom in nursing home as the place of occurrence of the external cause: Secondary | ICD-10-CM

## 2020-05-28 DIAGNOSIS — M25552 Pain in left hip: Secondary | ICD-10-CM | POA: Diagnosis not present

## 2020-05-28 DIAGNOSIS — S7292XA Unspecified fracture of left femur, initial encounter for closed fracture: Secondary | ICD-10-CM

## 2020-05-28 DIAGNOSIS — J984 Other disorders of lung: Secondary | ICD-10-CM | POA: Diagnosis not present

## 2020-05-28 DIAGNOSIS — G629 Polyneuropathy, unspecified: Secondary | ICD-10-CM | POA: Diagnosis present

## 2020-05-28 DIAGNOSIS — G9389 Other specified disorders of brain: Secondary | ICD-10-CM | POA: Diagnosis not present

## 2020-05-28 DIAGNOSIS — M419 Scoliosis, unspecified: Secondary | ICD-10-CM | POA: Diagnosis present

## 2020-05-28 DIAGNOSIS — M4319 Spondylolisthesis, multiple sites in spine: Secondary | ICD-10-CM | POA: Diagnosis not present

## 2020-05-28 DIAGNOSIS — R52 Pain, unspecified: Secondary | ICD-10-CM

## 2020-05-28 DIAGNOSIS — F32A Depression, unspecified: Secondary | ICD-10-CM | POA: Diagnosis present

## 2020-05-28 DIAGNOSIS — Z20822 Contact with and (suspected) exposure to covid-19: Secondary | ICD-10-CM | POA: Diagnosis present

## 2020-05-28 DIAGNOSIS — D649 Anemia, unspecified: Secondary | ICD-10-CM | POA: Diagnosis not present

## 2020-05-28 DIAGNOSIS — Z96642 Presence of left artificial hip joint: Secondary | ICD-10-CM | POA: Diagnosis not present

## 2020-05-28 DIAGNOSIS — I517 Cardiomegaly: Secondary | ICD-10-CM | POA: Diagnosis not present

## 2020-05-28 DIAGNOSIS — H353130 Nonexudative age-related macular degeneration, bilateral, stage unspecified: Secondary | ICD-10-CM | POA: Diagnosis present

## 2020-05-28 DIAGNOSIS — Z9181 History of falling: Secondary | ICD-10-CM | POA: Diagnosis not present

## 2020-05-28 DIAGNOSIS — R55 Syncope and collapse: Secondary | ICD-10-CM | POA: Diagnosis not present

## 2020-05-28 DIAGNOSIS — S7290XA Unspecified fracture of unspecified femur, initial encounter for closed fracture: Secondary | ICD-10-CM | POA: Diagnosis not present

## 2020-05-28 DIAGNOSIS — S72009A Fracture of unspecified part of neck of unspecified femur, initial encounter for closed fracture: Secondary | ICD-10-CM

## 2020-05-28 DIAGNOSIS — E7800 Pure hypercholesterolemia, unspecified: Secondary | ICD-10-CM | POA: Diagnosis present

## 2020-05-28 DIAGNOSIS — I1310 Hypertensive heart and chronic kidney disease without heart failure, with stage 1 through stage 4 chronic kidney disease, or unspecified chronic kidney disease: Secondary | ICD-10-CM | POA: Diagnosis present

## 2020-05-28 DIAGNOSIS — I6529 Occlusion and stenosis of unspecified carotid artery: Secondary | ICD-10-CM | POA: Diagnosis present

## 2020-05-28 DIAGNOSIS — S8992XA Unspecified injury of left lower leg, initial encounter: Secondary | ICD-10-CM | POA: Diagnosis not present

## 2020-05-28 DIAGNOSIS — S0003XA Contusion of scalp, initial encounter: Secondary | ICD-10-CM | POA: Diagnosis present

## 2020-05-28 DIAGNOSIS — G8929 Other chronic pain: Secondary | ICD-10-CM | POA: Diagnosis present

## 2020-05-28 DIAGNOSIS — K219 Gastro-esophageal reflux disease without esophagitis: Secondary | ICD-10-CM | POA: Diagnosis present

## 2020-05-28 DIAGNOSIS — S199XXA Unspecified injury of neck, initial encounter: Secondary | ICD-10-CM | POA: Diagnosis not present

## 2020-05-28 DIAGNOSIS — Z7982 Long term (current) use of aspirin: Secondary | ICD-10-CM

## 2020-05-28 DIAGNOSIS — R000 Tachycardia, unspecified: Secondary | ICD-10-CM | POA: Diagnosis not present

## 2020-05-28 DIAGNOSIS — E1122 Type 2 diabetes mellitus with diabetic chronic kidney disease: Secondary | ICD-10-CM | POA: Diagnosis present

## 2020-05-28 DIAGNOSIS — W19XXXA Unspecified fall, initial encounter: Secondary | ICD-10-CM | POA: Diagnosis not present

## 2020-05-28 DIAGNOSIS — I499 Cardiac arrhythmia, unspecified: Secondary | ICD-10-CM | POA: Diagnosis not present

## 2020-05-28 DIAGNOSIS — I4891 Unspecified atrial fibrillation: Secondary | ICD-10-CM | POA: Diagnosis present

## 2020-05-28 DIAGNOSIS — R1310 Dysphagia, unspecified: Secondary | ICD-10-CM | POA: Diagnosis present

## 2020-05-28 DIAGNOSIS — N183 Chronic kidney disease, stage 3 unspecified: Secondary | ICD-10-CM | POA: Diagnosis present

## 2020-05-28 DIAGNOSIS — S72002A Fracture of unspecified part of neck of left femur, initial encounter for closed fracture: Secondary | ICD-10-CM | POA: Diagnosis not present

## 2020-05-28 DIAGNOSIS — E1322 Other specified diabetes mellitus with diabetic chronic kidney disease: Secondary | ICD-10-CM | POA: Diagnosis present

## 2020-05-28 DIAGNOSIS — Z90710 Acquired absence of both cervix and uterus: Secondary | ICD-10-CM

## 2020-05-28 DIAGNOSIS — N1831 Chronic kidney disease, stage 3a: Secondary | ICD-10-CM | POA: Diagnosis present

## 2020-05-28 DIAGNOSIS — Z66 Do not resuscitate: Secondary | ICD-10-CM | POA: Diagnosis present

## 2020-05-28 DIAGNOSIS — Z833 Family history of diabetes mellitus: Secondary | ICD-10-CM

## 2020-05-28 DIAGNOSIS — Z681 Body mass index (BMI) 19 or less, adult: Secondary | ICD-10-CM

## 2020-05-28 DIAGNOSIS — M47812 Spondylosis without myelopathy or radiculopathy, cervical region: Secondary | ICD-10-CM | POA: Diagnosis not present

## 2020-05-28 DIAGNOSIS — Z419 Encounter for procedure for purposes other than remedying health state, unspecified: Secondary | ICD-10-CM

## 2020-05-28 DIAGNOSIS — E039 Hypothyroidism, unspecified: Secondary | ICD-10-CM | POA: Diagnosis present

## 2020-05-28 DIAGNOSIS — E785 Hyperlipidemia, unspecified: Secondary | ICD-10-CM | POA: Diagnosis not present

## 2020-05-28 DIAGNOSIS — R58 Hemorrhage, not elsewhere classified: Secondary | ICD-10-CM | POA: Diagnosis not present

## 2020-05-28 DIAGNOSIS — S72012A Unspecified intracapsular fracture of left femur, initial encounter for closed fracture: Secondary | ICD-10-CM | POA: Diagnosis present

## 2020-05-28 DIAGNOSIS — W06XXXA Fall from bed, initial encounter: Secondary | ICD-10-CM | POA: Diagnosis present

## 2020-05-28 DIAGNOSIS — I6522 Occlusion and stenosis of left carotid artery: Secondary | ICD-10-CM

## 2020-05-28 DIAGNOSIS — Z79899 Other long term (current) drug therapy: Secondary | ICD-10-CM

## 2020-05-28 DIAGNOSIS — S72042A Displaced fracture of base of neck of left femur, initial encounter for closed fracture: Secondary | ICD-10-CM | POA: Diagnosis not present

## 2020-05-28 DIAGNOSIS — E43 Unspecified severe protein-calorie malnutrition: Secondary | ICD-10-CM | POA: Diagnosis present

## 2020-05-28 DIAGNOSIS — F0150 Vascular dementia without behavioral disturbance: Secondary | ICD-10-CM | POA: Diagnosis present

## 2020-05-28 DIAGNOSIS — Z471 Aftercare following joint replacement surgery: Secondary | ICD-10-CM | POA: Diagnosis not present

## 2020-05-28 DIAGNOSIS — Z8249 Family history of ischemic heart disease and other diseases of the circulatory system: Secondary | ICD-10-CM

## 2020-05-28 LAB — CBC WITH DIFFERENTIAL/PLATELET
Abs Immature Granulocytes: 0.11 10*3/uL — ABNORMAL HIGH (ref 0.00–0.07)
Basophils Absolute: 0.1 10*3/uL (ref 0.0–0.1)
Basophils Relative: 0 %
Eosinophils Absolute: 0 10*3/uL (ref 0.0–0.5)
Eosinophils Relative: 0 %
HCT: 41.2 % (ref 36.0–46.0)
Hemoglobin: 13.6 g/dL (ref 12.0–15.0)
Immature Granulocytes: 1 %
Lymphocytes Relative: 6 %
Lymphs Abs: 1.2 10*3/uL (ref 0.7–4.0)
MCH: 34.7 pg — ABNORMAL HIGH (ref 26.0–34.0)
MCHC: 33 g/dL (ref 30.0–36.0)
MCV: 105.1 fL — ABNORMAL HIGH (ref 80.0–100.0)
Monocytes Absolute: 1.2 10*3/uL — ABNORMAL HIGH (ref 0.1–1.0)
Monocytes Relative: 6 %
Neutro Abs: 17.4 10*3/uL — ABNORMAL HIGH (ref 1.7–7.7)
Neutrophils Relative %: 87 %
Platelets: 215 10*3/uL (ref 150–400)
RBC: 3.92 MIL/uL (ref 3.87–5.11)
RDW: 13.4 % (ref 11.5–15.5)
WBC: 19.9 10*3/uL — ABNORMAL HIGH (ref 4.0–10.5)
nRBC: 0 % (ref 0.0–0.2)

## 2020-05-28 LAB — PROTIME-INR
INR: 1 (ref 0.8–1.2)
Prothrombin Time: 13.5 seconds (ref 11.4–15.2)

## 2020-05-28 LAB — COMPREHENSIVE METABOLIC PANEL
ALT: 18 U/L (ref 0–44)
AST: 43 U/L — ABNORMAL HIGH (ref 15–41)
Albumin: 3.7 g/dL (ref 3.5–5.0)
Alkaline Phosphatase: 64 U/L (ref 38–126)
Anion gap: 8 (ref 5–15)
BUN: 22 mg/dL (ref 8–23)
CO2: 28 mmol/L (ref 22–32)
Calcium: 9.2 mg/dL (ref 8.9–10.3)
Chloride: 100 mmol/L (ref 98–111)
Creatinine, Ser: 0.9 mg/dL (ref 0.44–1.00)
GFR, Estimated: 58 mL/min — ABNORMAL LOW (ref >60)
Glucose, Bld: 130 mg/dL — ABNORMAL HIGH (ref 70–99)
Potassium: 4.8 mmol/L (ref 3.5–5.1)
Sodium: 136 mmol/L (ref 135–145)
Total Bilirubin: 1 mg/dL (ref 0.3–1.2)
Total Protein: 6.9 g/dL (ref 6.5–8.1)

## 2020-05-28 LAB — RESP PANEL BY RT-PCR (FLU A&B, COVID) ARPGX2
Influenza A by PCR: NEGATIVE
Influenza B by PCR: NEGATIVE
SARS Coronavirus 2 by RT PCR: NEGATIVE

## 2020-05-28 LAB — APTT: aPTT: 33 seconds (ref 24–36)

## 2020-05-28 LAB — MRSA PCR SCREENING: MRSA by PCR: NEGATIVE

## 2020-05-28 MED ORDER — LIP MEDEX EX OINT
TOPICAL_OINTMENT | CUTANEOUS | Status: AC
  Filled 2020-05-28: qty 7

## 2020-05-28 MED ORDER — ASPIRIN 81 MG PO CHEW
81.0000 mg | CHEWABLE_TABLET | Freq: Every day | ORAL | Status: DC
  Administered 2020-05-30 – 2020-06-01 (×3): 81 mg via ORAL
  Filled 2020-05-28 (×3): qty 1

## 2020-05-28 MED ORDER — ACETAMINOPHEN 325 MG PO TABS
650.0000 mg | ORAL_TABLET | Freq: Once | ORAL | Status: DC
  Filled 2020-05-28: qty 2

## 2020-05-28 MED ORDER — FAMOTIDINE 20 MG PO TABS
20.0000 mg | ORAL_TABLET | Freq: Every day | ORAL | Status: DC
  Administered 2020-05-30 – 2020-06-01 (×3): 20 mg via ORAL
  Filled 2020-05-28 (×4): qty 1

## 2020-05-28 MED ORDER — MORPHINE SULFATE (PF) 4 MG/ML IV SOLN
4.0000 mg | Freq: Once | INTRAVENOUS | Status: AC
Start: 2020-05-28 — End: 2020-05-28
  Administered 2020-05-28: 4 mg via INTRAVENOUS
  Filled 2020-05-28: qty 1

## 2020-05-28 MED ORDER — METOPROLOL TARTRATE 5 MG/5ML IV SOLN
5.0000 mg | Freq: Once | INTRAVENOUS | Status: AC
  Administered 2020-05-28: 5 mg via INTRAVENOUS
  Filled 2020-05-28: qty 5

## 2020-05-28 MED ORDER — METOPROLOL TARTRATE 5 MG/5ML IV SOLN
5.0000 mg | Freq: Three times a day (TID) | INTRAVENOUS | Status: DC | PRN
  Administered 2020-05-28 – 2020-05-29 (×2): 5 mg via INTRAVENOUS
  Filled 2020-05-28 (×3): qty 5

## 2020-05-28 MED ORDER — ATENOLOL 25 MG PO TABS
12.5000 mg | ORAL_TABLET | Freq: Two times a day (BID) | ORAL | Status: DC
  Administered 2020-05-28 – 2020-05-31 (×5): 12.5 mg via ORAL
  Filled 2020-05-28 (×3): qty 1
  Filled 2020-05-28: qty 0.5
  Filled 2020-05-28 (×2): qty 1

## 2020-05-28 MED ORDER — FENTANYL CITRATE (PF) 100 MCG/2ML IJ SOLN
50.0000 ug | Freq: Once | INTRAMUSCULAR | Status: AC
  Administered 2020-05-28: 50 ug via INTRAVENOUS
  Filled 2020-05-28: qty 2

## 2020-05-28 MED ORDER — HYDROMORPHONE HCL 1 MG/ML IJ SOLN
0.5000 mg | INTRAMUSCULAR | Status: DC | PRN
Start: 2020-05-28 — End: 2020-06-01
  Administered 2020-05-28 – 2020-06-01 (×11): 0.5 mg via INTRAVENOUS
  Filled 2020-05-28 (×8): qty 0.5
  Filled 2020-05-28: qty 1
  Filled 2020-05-28 (×2): qty 0.5

## 2020-05-28 MED ORDER — HYDROCODONE-ACETAMINOPHEN 5-325 MG PO TABS: 1.0000 | ORAL_TABLET | Freq: Four times a day (QID) | ORAL | Status: DC | PRN

## 2020-05-28 MED ORDER — ENOXAPARIN SODIUM 30 MG/0.3ML IJ SOSY: 30.0000 mg | PREFILLED_SYRINGE | INTRAMUSCULAR | Status: DC

## 2020-05-28 MED ORDER — ENOXAPARIN SODIUM 30 MG/0.3ML IJ SOSY: 30.0000 mg | PREFILLED_SYRINGE | INTRAMUSCULAR | Status: AC

## 2020-05-28 NOTE — ED Provider Notes (Signed)
[facility] DEPT Provider Note   CSN: [id number] Arrival date & time: 05/28/20  0434     History Chief Complaint  Patient presents with  . Fall    Vanessa [name] is a 87 y.o. female atrial fibrillation, dementia, CKD.  Patient alert to person only, level 5 caveat applies.  Per triage note patient comes from [facility] last night after suffering an unwitnessed fall.  Patient complains of left hip pain.  HPI     Past Medical History:  Diagnosis Date  . Abdominal bloating   . Atrial fibrillation ([place])   . Bruises easily   . Carotid artery occlusion    LEFT  . Cricopharyngeal dysphagia   . Dizziness   . DOE (dyspnea on exertion) 04/02/2014  . Fall at home Sept 2013, Dec. 2013  Jun 08, 2012  . GERD (gastroesophageal reflux disease) 02/11/2015  . Headache(784.0)   . Hoarseness   . Hypercholesterolemia   . Hyperglycemia 04/02/2014   Glucose 178 mg percent on 01/22/2014 04/05/14 Hgb A1c 6.6 Diet controlled.    . Hypertensive retinopathy    OU  . Hypothyroidism 04/15/2015  . Macular degeneration    Dry OU  . Neuropathy    PERIPHERAL  . Pruritus   . Scoliosis   . Varicose veins   . Zenker's diverticulum     Patient Active Problem List   Diagnosis Date Noted  . Leukocytosis 04/08/2020  . Loss, vision, sudden, bilateral 02/12/2020  . Vascular dementia ([place]) 02/12/2020  . CKD (chronic kidney disease) stage 3, GFR 30-59 ml/min (HCC) 02/12/2020  . Secondary diabetes mellitus with stage 3 chronic kidney disease (GFR 30-59) ([place]) 01/22/2020  . Urinary incontinence 12/03/2019  . Macular degeneration, age related 05/09/2019  . Choking 03/30/2019  . Cricopharyngeus muscle dysfunction   . Allergic rhinitis 02/01/2019  . Stomatitis 12/15/2018  . Mild cognitive impairment 08/01/2017  . Leg mass, left 04/01/2017  . Moderate protein-calorie malnutrition ([place]) 12/27/2016  . Closed fracture of nasal bones   . Severe protein-calorie  malnutrition ([place])   . Atrial fibrillation with RVR ([place]) 12/02/2016  . Esophageal stenosis   . History of rib fracture 08/04/2016  . Age-related osteoporosis without current pathological fracture 08/04/2016  . Unsteady gait 04/19/2016  . Urinary frequency 12/09/2015  . OCD (obsessive compulsive disorder) 09/02/2015  . Hypothyroidism 04/15/2015  . GERD (gastroesophageal reflux disease) 02/11/2015  . Constipation 02/11/2015  . Cricopharyngeal hypertrophy 08/27/2014  . Dysphonia 08/27/2014  . 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 Chronic lower back pain 08/13/2014  . Carotid stenosis 08/13/2014  . Xerostomia 06/18/2014  . Tingling sensation-Left Leg 06/05/2014  . Hyperglycemia 04/02/2014  . Weight loss 04/02/2014  . Idiopathic scoliosis 04/02/2014  . Hearing loss 04/02/2014  . Dysphagia 04/02/2014  . Seborrheic keratoses, inflamed 04/02/2014  . Weakness 09/12/2013  . Anxiety state 09/12/2013  . GI bleed 09/10/2013  . Other and unspecified ovarian cyst 09/10/2013  . Fall 09/03/2013  . PVD (peripheral vascular disease) ([place]) 02/21/2013  . Occlusion of left internal carotid artery 08/16/2012  . Scalp hematoma, subsequent encounter 02/08/2012  . Varicose veins 12/22/2011  . Raynaud phenomenon 06/26/2010  . Osteoarthritis 06/26/2010  . Neuropathy   . Hyperlipidemia     Past Surgical History:  Procedure Laterality Date  . ABDOMINAL HYSTERECTOMY  1954  . CATARACT EXTRACTION Bilateral   . CHOLECYSTECTOMY  1997  . ESOPHAGOGASTRODUODENOSCOPY N/A 03/30/2019   Procedure: ESOPHAGOGASTRODUODENOSCOPY (EGD);  Surgeon: Jerene Bears, MD;  Location: [facility] ENDOSCOPY;  Service: Gastroenterology;  Laterality: N/A;  . ESOPHAGOGASTRODUODENOSCOPY (EGD) WITH PROPOFOL N/A 11/25/2016   Procedure: ESOPHAGOGASTRODUODENOSCOPY (EGD) WITH PROPOFOL;  Surgeon: [name], MD;  Location: [facility] ENDOSCOPY;  Service: Endoscopy;  Laterality: N/A;  . EYE SURGERY Bilateral    Cat Sx  . SPINE SURGERY  1997    correct scoliosis     OB History   No obstetric history on file.     Family History  Adopted: Yes  Problem Relation Age of Onset  . Diabetes Mother   . Heart attack Mother   . Heart disease Mother        After age 37  . Heart attack Father   . Stroke Father   . Breast cancer Sister   . Heart disease Maternal Grandmother   . Cancer Son        adopted son. esophagus, stomach, liver  . Stomach cancer Neg Hx   . Colon cancer Neg Hx     Social History   Tobacco Use  . Smoking status: Never Smoker  . Smokeless tobacco: Never Used  Vaping Use  . Vaping Use: Never used  Substance Use Topics  . Alcohol use: No    Alcohol/week: 0.0 standard drinks  . Drug use: No    Home Medications Prior to Admission medications   Medication Sig Start Date End Date Taking? Authorizing Provider  acetaminophen (TYLENOL) 325 MG tablet Take 650 mg by mouth every 8 (eight) hours as needed.    [provider]  acetaminophen (TYLENOL) 500 MG tablet Take 500 mg by mouth 2 (two) times daily. Patient not taking: Reported on 05/14/2020    [provider]  antiseptic oral rinse (BIOTENE) LIQD 5 mLs by Mouth Rinse route in the morning and at bedtime.    [provider]  aspirin 81 MG chewable tablet Chew 81 mg by mouth daily.    [provider]  atenolol (TENORMIN) 25 MG tablet Take 12.5 mg by mouth 2 (two) times daily. Hold if SBP < 100 or HR < 60    [provider]  Biotin 5000 MCG TABS Take 5,000 mcg by mouth daily.    [provider]  Calcium-Vitamin D-Vitamin K (VIACTIV PO) Take 1 tablet by mouth daily. 02/28/18   [provider]  famotidine (PEPCID) 20 MG tablet Take 20 mg by mouth daily.    [provider]  fluticasone (FLONASE) 50 MCG/ACT nasal spray Place 2 sprays into both nostrils daily.    [provider]  lactose free nutrition (BOOST PLUS) LIQD Take 237 mLs by mouth 2 (two) times daily with a meal. Per request of  resident, states can't eat if takes it later    [provider]  loratadine (CLARITIN) 10 MG tablet Take 10 mg by mouth daily as needed for allergies.    [provider]  Menthol, Topical Analgesic, 4 % GEL Apply 1 application topically every 8 (eight) hours. Apply to LLE    [provider]  Pediatric Multiple Vitamins (THERA MULTI-VITAMIN PO) Take 1 tablet by mouth daily.    [provider]  Peppermint Oil (IBGARD) 90 MG CPCR Take 2 capsules by mouth 2 (two) times daily.  09/26/18   [provider]  polyethylene glycol (MIRALAX / GLYCOLAX) packet Take 8.5-17 g by mouth See admin instructions. 1/2 dose QAM scheduled, 1 dose qd prn    [provider]  pregabalin (LYRICA) 25 MG capsule Take 1 capsule (25 mg  total) by mouth 2 (two) times daily. 03/18/20   Mast, Man X, NP  pregabalin (LYRICA) 50 MG capsule Take 25 mg by mouth at bedtime. Patient not taking: Reported on 05/14/2020    [provider]  Propylene Glycol (SYSTANE BALANCE) 0.6 % SOLN Give 1 drop to both eyes once daily as needed for dryness    [provider]  Riboflavin (VITAMIN B-2) 25 MG TABS Take 1 tablet by mouth daily.    [provider]  sennosides-docusate sodium (SENOKOT-S) 8.6-50 MG tablet Take 2 tablets by mouth daily.     [provider]  sodium phosphate (FLEET) 7-19 GM/118ML ENEM Place 1 enema rectally as needed for severe constipation. Give Fleet enema per rectum X 1 after removing hard stool or if soft stool is present in rectum.    [provider]  traMADol (ULTRAM) 50 MG tablet Take 1 tablet (50 mg total) by mouth 2 (two) times daily. 03/11/20   [name], MD  triamcinolone cream (KENALOG) 0.1 % Apply 1 application topically as needed. Apply to itchy areas as needed. May keep in room and self apply    [provider]  zinc oxide 20 % ointment Apply 1 application topically as needed for irritation.    [provider]    Allergies    Patient has no known allergies.  Review of Systems   Review of Systems  Unable to perform ROS: Dementia    Physical Exam Updated Vital Signs BP (!) 186/151   Pulse 99   Temp 97.8 F (36.6 C)   Resp 17   SpO2 100%   Physical Exam Vitals and nursing note reviewed.  Constitutional:      General: She is not in acute distress.    Appearance: She is not ill-appearing, toxic-appearing or diaphoretic.     Interventions: Cervical collar in place.     Comments: C-collar in place; uncomfortable due to complaints of pain  HENT:     Head: Normocephalic. Contusion and laceration present. No raccoon eyes, abrasion, masses, right periorbital erythema or left periorbital erythema.     Jaw: No trismus or pain on movement.     Comments: Large contusion to left side of forehead Eyes:     General: No scleral icterus.       Right eye: No discharge.        Left eye: No discharge.     Extraocular Movements: Extraocular movements intact.     Pupils: Pupils are equal, round, and reactive to light.  Cardiovascular:     Rate and Rhythm: Normal rate.     Heart sounds: Normal heart sounds.  Pulmonary:     Effort: Pulmonary effort is normal.     Breath sounds: Normal breath sounds.  Chest:     Chest wall: No mass, lacerations, deformity, swelling, tenderness, crepitus or edema.  Abdominal:     General: There is no distension. There are no signs of injury.     Palpations: Abdomen is soft. There is no mass or pulsatile mass.     Tenderness: There is no abdominal tenderness. There is no guarding or rebound.  Musculoskeletal:     Cervical back: Neck supple.     Comments: Shortening and external rotation to left lower extremity, +3 dorsalis pedis pulse, patient has full range of motion to all digits of left foot, left foot is warm without signs of pallor or cyanosis  Skin:    General: Skin is warm and dry.  Neurological:     General: No focal deficit present.     Mental Status:  She is alert.  Psychiatric:        Behavior: Behavior is cooperative.     ED Results / Procedures / Treatments   Labs (all labs ordered are listed, but only abnormal results are displayed) Labs Reviewed  CBC WITH DIFFERENTIAL/PLATELET - Abnormal; Notable for the following components:      Result Value   WBC 19.9 (*)    MCV 105.1 (*)    MCH 34.7 (*)    Neutro Abs 17.4 (*)    Monocytes Absolute 1.2 (*)    Abs Immature Granulocytes 0.11 (*)    All other components within normal limits  COMPREHENSIVE METABOLIC PANEL - Abnormal; Notable for the following components:   Glucose, Bld 130 (*)    AST 43 (*)    GFR, Estimated 58 (*)    All other components within normal limits  RESP PANEL BY RT-PCR (FLU A&B, COVID) [id number]  MRSA PCR SCREENING  PROTIME-INR  APTT  CBC  BASIC METABOLIC PANEL    EKG None  Radiology DG Chest 1 View  Result Date: 05/28/2020 CLINICAL DATA:  87 year old female status post fall with left femoral neck fracture. EXAM: CHEST  1 VIEW COMPARISON:  Portable chest 03/30/2019 and earlier. FINDINGS: Supine AP views at 0744 hours. Chest is somewhat overpenetrated. Chronic pulmonary hyperinflation. Stable cardiomegaly and mediastinal contours. Allowing for portable technique the lungs are clear. No pneumothorax. Visualized tracheal air column is within normal limits. Advanced degenerative changes at the thoracolumbar junction with partially visible lumbar fusion hardware. No acute osseous abnormality identified in the chest. IMPRESSION: Pulmonary hyperinflation.  No acute cardiopulmonary abnormality. Electronically Signed   By: Genevie Ann M.D.   On: 05/28/2020 07:57   CT Head Wo Contrast  Result Date: 05/28/2020 CLINICAL DATA:  Head and neck trauma Unwitnessed fall Found on floor at nursing home Left forehead hematoma EXAM: CT HEAD WITHOUT CONTRAST CT CERVICAL SPINE WITHOUT CONTRAST TECHNIQUE: Multidetector CT imaging of the head and cervical spine was performed following the  standard protocol without intravenous contrast. Multiplanar CT image reconstructions of the cervical spine were also generated. COMPARISON:  04/05/2020 FINDINGS: CT HEAD FINDINGS Brain: No evidence of acute infarction, hemorrhage, hydrocephalus, extra-axial collection or mass lesion/mass effect. Periventricular white matter hypodensity is a nonspecific finding, but most commonly relates to chronic ischemic small vessel disease. 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 Vascular: No hyperdense vessel or unexpected calcification. Skull: Normal. Negative for fracture or focal lesion. Sinuses/Orbits: No acute finding. Other: Large left frontal hematoma.  Small left posterior hematoma. CT CERVICAL SPINE FINDINGS Alignment: Grade 1 anterolisthesis of C3 on C4 and C4 on C5. Grade 1 retrolisthesis of C5 on C6. Grade 1 anterolisthesis of C7 on T1. Skull base and vertebrae: Anterior subluxation of the dens relative to the tip of the clivus unchanged from prior examination and likely due to ligamentous laxity. No acute fracture or dislocation. Soft tissues and spinal canal: No acute fracture or dislocation is identified. Prevertebral soft tissues are normal thickness. Disc levels: Advanced degenerative changes again seen throughout the cervical spine. Upper chest: Biapical pleuroparenchymal scarring. Other: None IMPRESSION: 1. Large left frontal scalp hematoma. No acute intracranial abnormality. 2. No acute fracture or dislocation of the cervical or visualized upper thoracic spine. Electronically Signed   By: Miachel Roux M.D.   On: 05/28/2020 08:22   CT Cervical Spine Wo Contrast  Result Date:  05/28/2020 CLINICAL DATA:  Head and neck trauma Unwitnessed fall Found on floor at nursing home Left forehead hematoma EXAM: CT HEAD WITHOUT CONTRAST CT CERVICAL SPINE WITHOUT CONTRAST TECHNIQUE: Multidetector CT imaging of the head and cervical spine was performed following the standard protocol  without intravenous contrast. Multiplanar CT image reconstructions of the cervical spine were also generated. COMPARISON:  04/05/2020 FINDINGS: CT HEAD FINDINGS Brain: No evidence of acute infarction, hemorrhage, hydrocephalus, extra-axial collection or mass lesion/mass effect. Periventricular white matter hypodensity is a nonspecific finding, but most commonly relates to chronic ischemic small vessel disease. 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 Vascular: No hyperdense vessel or unexpected calcification. Skull: Normal. Negative for fracture or focal lesion. Sinuses/Orbits: No acute finding. Other: Large left frontal hematoma.  Small left posterior hematoma. CT CERVICAL SPINE FINDINGS Alignment: Grade 1 anterolisthesis of C3 on C4 and C4 on C5. Grade 1 retrolisthesis of C5 on C6. Grade 1 anterolisthesis of C7 on T1. Skull base and vertebrae: Anterior subluxation of the dens relative to the tip of the clivus unchanged from prior examination and likely due to ligamentous laxity. No acute fracture or dislocation. Soft tissues and spinal canal: No acute fracture or dislocation is identified. Prevertebral soft tissues are normal thickness. Disc levels: Advanced degenerative changes again seen throughout the cervical spine. Upper chest: Biapical pleuroparenchymal scarring. Other: None IMPRESSION: 1. Large left frontal scalp hematoma. No acute intracranial abnormality. 2. No acute fracture or dislocation of the cervical or visualized upper thoracic spine. Electronically Signed   By: Miachel Roux M.D.   On: 05/28/2020 08:22   DG Knee Complete 4 Views Left  Result Date: 05/28/2020 CLINICAL DATA:  87 year old female status post fall with left femoral neck fracture. EXAM: LEFT KNEE - COMPLETE 4+ VIEW COMPARISON:  Left hip series today.  Left knee series 12/01/2016. FINDINGS: AP oblique and cross-table lateral views. Joint spaces and alignment remain normal for age. No acute osseous  abnormality identified. No joint effusion. Bone mineralization is within normal limits for age. IMPRESSION: No acute fracture or dislocation identified about the left knee. Electronically Signed   By: Genevie Ann M.D.   On: 05/28/2020 10:47   DG Hip Unilat With Pelvis 2-3 Views Left  Result Date: 05/28/2020 CLINICAL DATA:  87 year old female status post fall with left hip pain. EXAM: DG HIP (WITH OR WITHOUT PELVIS) 2-3V LEFT COMPARISON:  Abdominal radiographs 11/14/2014. FINDINGS: AP and cross-table lateral views demonstrate an acute left femoral neck fracture with impaction. Left femoral head remains normally located. Intertrochanteric segment remains intact. Grossly intact proximal right femur. No acute pelvic fracture identified. Chronic lumbar fusion hardware. Non obstructed visible bowel gas pattern. IMPRESSION: Acute, impacted left femoral neck fracture. Electronically Signed   By: Genevie Ann M.D.   On: 05/28/2020 07:55     Procedures Procedures   Medications Ordered in ED Medications  acetaminophen (TYLENOL) tablet 650 mg (650 mg Oral Not Given 05/28/20 [id number])  morphine 4 MG/ML injection 4 mg (has no administration in time range)    ED Course  I have reviewed the triage vital signs and the nursing notes.  Pertinent labs & imaging results that were available during my care of the patient were reviewed by me and considered in my medical decision making (see chart for details).    MDM Rules/Calculators/A&P                          Alert 87 year old female uncomfortable  due to complaints of left hip pain.  Patient has c-collar in place.  Large hematoma to left side of forehead, patient has history of atrial fibrillation per chart review not on any blood thinners.  Due to patient's large forehead hematoma and advanced age will obtain CT scan of head and neck.  Patient has no tenderness to chest wall however due to dementia and fall will get chest x-ray to evaluate for possible fracture.  Patient  has shortening and external rotation of left hip suspect fracture and/or dislocation.  Will give patient 4 mg morphine as well as obtaining x-ray imaging.  Patient was noted to be tachycardic with rate in 120s; patient noted to be in atrial fibrillation.  Patient has history of atrial fibrillation.  Patient does not appear to be on any blood thinners.  Patient's tachycardia is likely secondary to pain will manage pain and reevaluate.  DG left hip shows acute impacted left femoral neck fracture. CXR shows pulmonary hyperinflation, no acute cardiopulmonary abnormality. CT head shows large left frontal scalp hematoma.  No acute intracranial abnormality. CT cervical spine shows no acute fracture or dislocation of the cervical or visualized upper thoracic spine.  C-collar was removed.  Will obtain CMP, CBC, APTT, INR, COVID testing as patient will need hospitalization as well as surgical fixation of left hip.  We will consult orthopedic team as well as hospitalist.  0920 spoke to RN who reports that Ortho team is currently in surgery and will be available for call back approximately 11.  Spoke to orthopedic PA Hilbert Odor who will see the patient for consultation.  Contacted by RN who reports that patient is tachycardic with rates of 1 10-1 40s.  We will give patient additional pain medication as well as 5 mg metoprolol.  Patient takes atenolol at baseline.  1004 spoke to hospitalist Dr. Neysa Bonito who agreed to see the patient for admission.     Final Clinical Impression(s) / ED Diagnoses Final diagnoses:  None    Rx / DC Orders ED Discharge Orders    None       Dyann Ruddle [id number]    Delora Fuel, MD [id number]

## 2020-05-28 NOTE — H&P (View-Only) (Signed)
Reason for Consult:Left hip fx Referring Physician: Hillard Danker Time called: [id number] Time at bedside: [place] is an 87 y.o. female.  HPI: Saraswati was at the facility where she resides when she suffered an unwitnessed fall. He had immediate left hip pain and could not get up. She was brought to the ED where x-rays showed a left hip fx and orthopedic surgery was consulted. She usually ambulates with a RW. Appears to be on no anticoagulants except for ASA.  Past Medical History:  Diagnosis Date  . Abdominal bloating   . Atrial fibrillation ([place])   . Bruises easily   . Carotid artery occlusion    LEFT  . Cricopharyngeal dysphagia   . Dizziness   . DOE (dyspnea on exertion) 04/02/2014  . Fall at home Sept 2013, Dec. 2013  Jun 08, 2012  . GERD (gastroesophageal reflux disease) 02/11/2015  . Headache(784.0)   . Hoarseness   . Hypercholesterolemia   . Hyperglycemia 04/02/2014   Glucose 178 mg percent on 01/22/2014 04/05/14 Hgb A1c 6.6 Diet controlled.    . Hypertensive retinopathy    OU  . Hypothyroidism 04/15/2015  . Macular degeneration    Dry OU  . Neuropathy    PERIPHERAL  . Pruritus   . Scoliosis   . Varicose veins   . Zenker's diverticulum     Past Surgical History:  Procedure Laterality Date  . ABDOMINAL HYSTERECTOMY  1954  . CATARACT EXTRACTION Bilateral   . CHOLECYSTECTOMY  1997  . ESOPHAGOGASTRODUODENOSCOPY N/A 03/30/2019   Procedure: ESOPHAGOGASTRODUODENOSCOPY (EGD);  Surgeon: Jerene Bears, MD;  Location: [facility];  Service: Gastroenterology;  Laterality: N/A;  . ESOPHAGOGASTRODUODENOSCOPY (EGD) WITH PROPOFOL N/A 11/25/2016   Procedure: ESOPHAGOGASTRODUODENOSCOPY (EGD) WITH PROPOFOL;  Surgeon: [name], MD;  Location: [facility];  Service: Endoscopy;  Laterality: N/A;  . EYE SURGERY Bilateral    Cat Sx  . SPINE SURGERY  1997   correct scoliosis    Family History  Adopted: Yes  Problem Relation Age of Onset  . Diabetes Mother   . Heart  attack Mother   . Heart disease Mother        After age 38  . Heart attack Father   . Stroke Father   . Breast cancer Sister   . Heart disease Maternal Grandmother   . Cancer Son        adopted son. esophagus, stomach, liver  . Stomach cancer Neg Hx   . Colon cancer Neg Hx     Social History:  reports that she has never smoked. She has never used smokeless tobacco. She reports that she does not drink alcohol and does not use drugs.  Allergies: No Known Allergies  Medications: I have reviewed the patient's current medications.  Results for orders placed or performed during the hospital encounter of 05/28/20 (from the past 48 hour(s))  CBC with Differential     Status: Abnormal   Collection Time: 05/28/20  8:31 AM  Result Value Ref Range   WBC 19.9 (H) 4.0 - 10.5 K/uL   RBC 3.92 3.87 - 5.11 MIL/uL   Hemoglobin 13.6 12.0 - 15.0 g/dL   HCT 41.2 36.0 - 46.0 %   MCV 105.1 (H) 80.0 - 100.0 fL   MCH 34.7 (H) 26.0 - 34.0 pg   MCHC 33.0 30.0 - 36.0 g/dL   RDW 13.4 11.5 - 15.5 %   Platelets 215 150 - 400 K/uL   nRBC 0.0 0.0 - 0.2 %  Neutrophils Relative % 87 %   Neutro Abs 17.4 (H) 1.7 - 7.7 K/uL   Lymphocytes Relative 6 %   Lymphs Abs 1.2 0.7 - 4.0 K/uL   Monocytes Relative 6 %   Monocytes Absolute 1.2 (H) 0.1 - 1.0 K/uL   Eosinophils Relative 0 %   Eosinophils Absolute 0.0 0.0 - 0.5 K/uL   Basophils Relative 0 %   Basophils Absolute 0.1 0.0 - 0.1 K/uL   Immature Granulocytes 1 %   Abs Immature Granulocytes 0.11 (H) 0.00 - 0.07 K/uL    Comment: Performed at [facility], [place] [address], [place], [place] [address]  Protime-INR     Status: None   Collection Time: 05/28/20  8:31 AM  Result Value Ref Range   Prothrombin Time 13.5 11.4 - 15.2 seconds   INR 1.0 0.8 - 1.2    Comment: (NOTE) INR goal varies based on device and disease states. Performed at [facility], [place] [address] [place], [place] [address]   APTT     Status: None    Collection Time: 05/28/20  8:31 AM  Result Value Ref Range   aPTT 33 24 - 36 seconds    Comment: Performed at [facility], [place] [address] [place], [place] [address]  Comprehensive metabolic panel     Status: Abnormal   Collection Time: 05/28/20  8:31 AM  Result Value Ref Range   Sodium 136 135 - 145 mmol/L   Potassium 4.8 3.5 - 5.1 mmol/L   Chloride 100 98 - 111 mmol/L   CO2 28 22 - 32 mmol/L   Glucose, Bld 130 (H) 70 - 99 mg/dL    Comment: Glucose reference range applies only to samples taken after fasting for at least 8 hours.   BUN 22 8 - 23 mg/dL   Creatinine, Ser 0.90 0.44 - 1.00 mg/dL   Calcium 9.2 8.9 - 10.3 mg/dL   Total Protein 6.9 6.5 - 8.1 g/dL   Albumin 3.7 3.5 - 5.0 g/dL   AST 43 (H) 15 - 41 U/L   ALT 18 0 - 44 U/L   Alkaline Phosphatase 64 38 - 126 U/L   Total Bilirubin 1.0 0.3 - 1.2 mg/dL   GFR, Estimated 58 (L) >60 mL/min    Comment: (NOTE) Calculated using the CKD-EPI Creatinine Equation (2021)    Anion gap 8 5 - 15    Comment: Performed at [facility] [place] [facility], [place] [address] [place], [place] [address]  Resp Panel by RT-PCR (Flu A&B, Covid) Nasopharyngeal Swab     Status: None   Collection Time: 05/28/20  8:32 AM   Specimen: Nasopharyngeal Swab; Nasopharyngeal(NP) swabs in vial transport medium  Result Value Ref Range   SARS Coronavirus 2 by RT PCR NEGATIVE NEGATIVE    Comment: (NOTE) SARS-CoV-2 target nucleic acids are NOT DETECTED.  The SARS-CoV-2 RNA is generally detectable in upper respiratory specimens during the acute phase of infection. The lowest concentration of SARS-CoV-2 viral copies this assay can detect is 138 copies/mL. A negative result does not preclude SARS-Cov-2 infection and should not be used as the sole basis for treatment or other patient management decisions. A negative result may occur with  improper specimen collection/handling, submission of specimen other than nasopharyngeal swab, presence of  viral mutation(s) within the areas targeted by this assay, and inadequate number of viral copies(<138 copies/mL). A negative result must be combined with clinical observations, patient history, and epidemiological information. The expected result is  Negative.  Fact Sheet for Patients:  EntrepreneurPulse.com.au  Fact Sheet for Healthcare Providers:  IncredibleEmployment.be  This test is no t yet approved or cleared by the [place] FDA and  has been authorized for detection and/or diagnosis of SARS-CoV-2 by FDA under an Emergency Use Authorization (EUA). This EUA will remain  in effect (meaning this test can be used) for the duration of the COVID-19 declaration under Section 564(b)(1) of the Act, 21 U.S.C.section 360bbb-3(b)(1), unless the authorization is terminated  or revoked sooner.       Influenza A by PCR NEGATIVE NEGATIVE   Influenza B by PCR NEGATIVE NEGATIVE    Comment: (NOTE) The Xpert Xpress SARS-CoV-2/FLU/RSV plus assay is intended as an aid in the diagnosis of influenza from Nasopharyngeal swab specimens and should not be used as a sole basis for treatment. Nasal washings and aspirates are unacceptable for Xpert Xpress SARS-CoV-2/FLU/RSV testing.  Fact Sheet for Patients: EntrepreneurPulse.com.au  Fact Sheet for Healthcare Providers: IncredibleEmployment.be  This test is not yet approved or cleared by the [place] FDA and has been authorized for detection and/or diagnosis of SARS-CoV-2 by FDA under an Emergency Use Authorization (EUA). This EUA will remain in effect (meaning this test can be used) for the duration of the COVID-19 declaration under Section 564(b)(1) of the Act, 21 U.S.C. section 360bbb-3(b)(1), unless the authorization is terminated or revoked.  Performed at [facility], [place] [address] [place], [place] [address]     DG Chest 1 View  Result  Date: 05/28/2020 CLINICAL DATA:  87 year old female status post fall with left femoral neck fracture. EXAM: CHEST  1 VIEW COMPARISON:  Portable chest 03/30/2019 and earlier. FINDINGS: Supine AP views at 0744 hours. Chest is somewhat overpenetrated. Chronic pulmonary hyperinflation. Stable cardiomegaly and mediastinal contours. Allowing for portable technique the lungs are clear. No pneumothorax. Visualized tracheal air column is within normal limits. Advanced degenerative changes at the thoracolumbar junction with partially visible lumbar fusion hardware. No acute osseous abnormality identified in the chest. IMPRESSION: Pulmonary hyperinflation.  No acute cardiopulmonary abnormality. Electronically Signed   By: Genevie Ann M.D.   On: 05/28/2020 07:57   CT Head Wo Contrast  Result Date: 05/28/2020 CLINICAL DATA:  Head and neck trauma Unwitnessed fall Found on floor at nursing home Left forehead hematoma EXAM: CT HEAD WITHOUT CONTRAST CT CERVICAL SPINE WITHOUT CONTRAST TECHNIQUE: Multidetector CT imaging of the head and cervical spine was performed following the standard protocol without intravenous contrast. Multiplanar CT image reconstructions of the cervical spine were also generated. COMPARISON:  04/05/2020 FINDINGS: CT HEAD FINDINGS Brain: No evidence of acute infarction, hemorrhage, hydrocephalus, extra-axial collection or mass lesion/mass effect. Periventricular white matter hypodensity is a nonspecific finding, but most commonly relates to chronic ischemic small vessel disease. 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 Vascular: No hyperdense vessel or unexpected calcification. Skull: Normal. Negative for fracture or focal lesion. Sinuses/Orbits: No acute finding. Other: Large left frontal hematoma.  Small left posterior hematoma. CT CERVICAL SPINE FINDINGS Alignment: Grade 1 anterolisthesis of C3 on C4 and C4 on C5. Grade 1 retrolisthesis of C5 on C6. Grade 1 anterolisthesis  of C7 on T1. Skull base and vertebrae: Anterior subluxation of the dens relative to the tip of the clivus unchanged from prior examination and likely due to ligamentous laxity. No acute fracture or dislocation. Soft tissues and spinal canal: No acute fracture or dislocation is identified. Prevertebral soft tissues are normal thickness. Disc levels: Advanced degenerative changes again  seen throughout the cervical spine. Upper chest: Biapical pleuroparenchymal scarring. Other: None IMPRESSION: 1. Large left frontal scalp hematoma. No acute intracranial abnormality. 2. No acute fracture or dislocation of the cervical or visualized upper thoracic spine. Electronically Signed   By: Miachel Roux M.D.   On: 05/28/2020 08:22   CT Cervical Spine Wo Contrast  Result Date: 05/28/2020 CLINICAL DATA:  Head and neck trauma Unwitnessed fall Found on floor at nursing home Left forehead hematoma EXAM: CT HEAD WITHOUT CONTRAST CT CERVICAL SPINE WITHOUT CONTRAST TECHNIQUE: Multidetector CT imaging of the head and cervical spine was performed following the standard protocol without intravenous contrast. Multiplanar CT image reconstructions of the cervical spine were also generated. COMPARISON:  04/05/2020 FINDINGS: CT HEAD FINDINGS Brain: No evidence of acute infarction, hemorrhage, hydrocephalus, extra-axial collection or mass lesion/mass effect. Periventricular white matter hypodensity is a nonspecific finding, but most commonly relates to chronic ischemic small vessel disease. 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 Vascular: No hyperdense vessel or unexpected calcification. Skull: Normal. Negative for fracture or focal lesion. Sinuses/Orbits: No acute finding. Other: Large left frontal hematoma.  Small left posterior hematoma. CT CERVICAL SPINE FINDINGS Alignment: Grade 1 anterolisthesis of C3 on C4 and C4 on C5. Grade 1 retrolisthesis of C5 on C6. Grade 1 anterolisthesis of C7 on T1. Skull  base and vertebrae: Anterior subluxation of the dens relative to the tip of the clivus unchanged from prior examination and likely due to ligamentous laxity. No acute fracture or dislocation. Soft tissues and spinal canal: No acute fracture or dislocation is identified. Prevertebral soft tissues are normal thickness. Disc levels: Advanced degenerative changes again seen throughout the cervical spine. Upper chest: Biapical pleuroparenchymal scarring. Other: None IMPRESSION: 1. Large left frontal scalp hematoma. No acute intracranial abnormality. 2. No acute fracture or dislocation of the cervical or visualized upper thoracic spine. Electronically Signed   By: Miachel Roux M.D.   On: 05/28/2020 08:22   DG Hip Unilat With Pelvis 2-3 Views Left  Result Date: 05/28/2020 CLINICAL DATA:  87 year old female status post fall with left hip pain. EXAM: DG HIP (WITH OR WITHOUT PELVIS) 2-3V LEFT COMPARISON:  Abdominal radiographs 11/14/2014. FINDINGS: AP and cross-table lateral views demonstrate an acute left femoral neck fracture with impaction. Left femoral head remains normally located. Intertrochanteric segment remains intact. Grossly intact proximal right femur. No acute pelvic fracture identified. Chronic lumbar fusion hardware. Non obstructed visible bowel gas pattern. IMPRESSION: Acute, impacted left femoral neck fracture. Electronically Signed   By: Genevie Ann M.D.   On: 05/28/2020 07:55    Review of Systems  HENT: Negative for ear discharge, ear pain, hearing loss and tinnitus.   Eyes: Negative for photophobia and pain.  Respiratory: Negative for cough and shortness of breath.   Cardiovascular: Negative for chest pain.  Gastrointestinal: Negative for abdominal pain, nausea and vomiting.  Genitourinary: Negative for dysuria, flank pain, frequency and urgency.  Musculoskeletal: Positive for arthralgias (Left hip). Negative for back pain, myalgias and neck pain.  Neurological: Negative for dizziness and  headaches.  Hematological: Does not bruise/bleed easily.  Psychiatric/Behavioral: The patient is not nervous/anxious.    Blood pressure (!) 160/97, pulse (!) 107, temperature 97.8 F (36.6 C), resp. rate 16, SpO2 98 %. Physical Exam Constitutional:      General: She is not in acute distress.    Appearance: She is well-developed. She is not diaphoretic.  HENT:     Head: Normocephalic and atraumatic.  Eyes:  General: No scleral icterus.       Right eye: No discharge.        Left eye: No discharge.     Conjunctiva/sclera: Conjunctivae normal.  Cardiovascular:     Rate and Rhythm: Normal rate and regular rhythm.  Pulmonary:     Effort: Pulmonary effort is normal. No respiratory distress.  Musculoskeletal:     Cervical back: Normal range of motion.     Comments: LLE No traumatic wounds, ecchymosis, or rash  Mod TTP hip/knee  No knee or ankle effusion  Knee stable to varus/ valgus and anterior/posterior stress  Sens DPN, SPN, TN intact  Motor EHL, ext, flex, evers 5/5  DP 1+, PT 1+, No significant edema  Skin:    General: Skin is warm and dry.  Neurological:     Mental Status: She is alert.  Psychiatric:        Behavior: Behavior normal.     Assessment/Plan: Left hip fx -- Plan hip hemi, timing TBD, by Dr. Lyla Glassing. Spoke with son, informed him of dx and tentative plan for surgery in the next 72h. Multiple medical problems including early dementia, depression, neuropathy, Afib, and chronic pain -- Will need medical admission and clearance. Appreciate their help.    Lisette Abu, PA-C Orthopedic Surgery [phone] 05/28/2020, 10:05 AM

## 2020-05-28 NOTE — ED Triage Notes (Addendum)
Patient arrived by EMS from [facility]. Patient had a unwitnessed fall. Nursing home staff heard patient yelling from the floor. No blood thinners. Hematoma on L forehead. And hip pain. Patient has a history of dementia. Patient cant state Name or DOB only mumbling.  20G R forearm.  75mg  fentanyl  Bp: 160/ 80- 100 94% RA  Pelvic binder and c collar in place

## 2020-05-28 NOTE — Consult Note (Addendum)
Reason for Consult:Left hip fx Referring Physician: Hillard Danker Time called: [id number] Time at bedside: [place] is an 87 y.o. female.  HPI: Saraswati was at the facility where she resides when she suffered an unwitnessed fall. He had immediate left hip pain and could not get up. She was brought to the ED where x-rays showed a left hip fx and orthopedic surgery was consulted. She usually ambulates with a RW. Appears to be on no anticoagulants except for ASA.  Past Medical History:  Diagnosis Date  . Abdominal bloating   . Atrial fibrillation ([place])   . Bruises easily   . Carotid artery occlusion    LEFT  . Cricopharyngeal dysphagia   . Dizziness   . DOE (dyspnea on exertion) 04/02/2014  . Fall at home Sept 2013, Dec. 2013  Jun 08, 2012  . GERD (gastroesophageal reflux disease) 02/11/2015  . Headache(784.0)   . Hoarseness   . Hypercholesterolemia   . Hyperglycemia 04/02/2014   Glucose 178 mg percent on 01/22/2014 04/05/14 Hgb A1c 6.6 Diet controlled.    . Hypertensive retinopathy    OU  . Hypothyroidism 04/15/2015  . Macular degeneration    Dry OU  . Neuropathy    PERIPHERAL  . Pruritus   . Scoliosis   . Varicose veins   . Zenker's diverticulum     Past Surgical History:  Procedure Laterality Date  . ABDOMINAL HYSTERECTOMY  1954  . CATARACT EXTRACTION Bilateral   . CHOLECYSTECTOMY  1997  . ESOPHAGOGASTRODUODENOSCOPY N/A 03/30/2019   Procedure: ESOPHAGOGASTRODUODENOSCOPY (EGD);  Surgeon: Jerene Bears, MD;  Location: [facility];  Service: Gastroenterology;  Laterality: N/A;  . ESOPHAGOGASTRODUODENOSCOPY (EGD) WITH PROPOFOL N/A 11/25/2016   Procedure: ESOPHAGOGASTRODUODENOSCOPY (EGD) WITH PROPOFOL;  Surgeon: [name], MD;  Location: [facility];  Service: Endoscopy;  Laterality: N/A;  . EYE SURGERY Bilateral    Cat Sx  . SPINE SURGERY  1997   correct scoliosis    Family History  Adopted: Yes  Problem Relation Age of Onset  . Diabetes Mother   . Heart  attack Mother   . Heart disease Mother        After age 38  . Heart attack Father   . Stroke Father   . Breast cancer Sister   . Heart disease Maternal Grandmother   . Cancer Son        adopted son. esophagus, stomach, liver  . Stomach cancer Neg Hx   . Colon cancer Neg Hx     Social History:  reports that she has never smoked. She has never used smokeless tobacco. She reports that she does not drink alcohol and does not use drugs.  Allergies: No Known Allergies  Medications: I have reviewed the patient's current medications.  Results for orders placed or performed during the hospital encounter of 05/28/20 (from the past 48 hour(s))  CBC with Differential     Status: Abnormal   Collection Time: 05/28/20  8:31 AM  Result Value Ref Range   WBC 19.9 (H) 4.0 - 10.5 K/uL   RBC 3.92 3.87 - 5.11 MIL/uL   Hemoglobin 13.6 12.0 - 15.0 g/dL   HCT 41.2 36.0 - 46.0 %   MCV 105.1 (H) 80.0 - 100.0 fL   MCH 34.7 (H) 26.0 - 34.0 pg   MCHC 33.0 30.0 - 36.0 g/dL   RDW 13.4 11.5 - 15.5 %   Platelets 215 150 - 400 K/uL   nRBC 0.0 0.0 - 0.2 %  Neutrophils Relative % 87 %   Neutro Abs 17.4 (H) 1.7 - 7.7 K/uL   Lymphocytes Relative 6 %   Lymphs Abs 1.2 0.7 - 4.0 K/uL   Monocytes Relative 6 %   Monocytes Absolute 1.2 (H) 0.1 - 1.0 K/uL   Eosinophils Relative 0 %   Eosinophils Absolute 0.0 0.0 - 0.5 K/uL   Basophils Relative 0 %   Basophils Absolute 0.1 0.0 - 0.1 K/uL   Immature Granulocytes 1 %   Abs Immature Granulocytes 0.11 (H) 0.00 - 0.07 K/uL    Comment: Performed at [facility], [place] [address], [place], [place] [address]  Protime-INR     Status: None   Collection Time: 05/28/20  8:31 AM  Result Value Ref Range   Prothrombin Time 13.5 11.4 - 15.2 seconds   INR 1.0 0.8 - 1.2    Comment: (NOTE) INR goal varies based on device and disease states. Performed at [facility], [place] [address] [place], [place] [address]   APTT     Status: None    Collection Time: 05/28/20  8:31 AM  Result Value Ref Range   aPTT 33 24 - 36 seconds    Comment: Performed at [facility], [place] [address] [place], [place] [address]  Comprehensive metabolic panel     Status: Abnormal   Collection Time: 05/28/20  8:31 AM  Result Value Ref Range   Sodium 136 135 - 145 mmol/L   Potassium 4.8 3.5 - 5.1 mmol/L   Chloride 100 98 - 111 mmol/L   CO2 28 22 - 32 mmol/L   Glucose, Bld 130 (H) 70 - 99 mg/dL    Comment: Glucose reference range applies only to samples taken after fasting for at least 8 hours.   BUN 22 8 - 23 mg/dL   Creatinine, Ser 0.90 0.44 - 1.00 mg/dL   Calcium 9.2 8.9 - 10.3 mg/dL   Total Protein 6.9 6.5 - 8.1 g/dL   Albumin 3.7 3.5 - 5.0 g/dL   AST 43 (H) 15 - 41 U/L   ALT 18 0 - 44 U/L   Alkaline Phosphatase 64 38 - 126 U/L   Total Bilirubin 1.0 0.3 - 1.2 mg/dL   GFR, Estimated 58 (L) >60 mL/min    Comment: (NOTE) Calculated using the CKD-EPI Creatinine Equation (2021)    Anion gap 8 5 - 15    Comment: Performed at [facility] [place] [facility], [place] [address] [place], [place] [address]  Resp Panel by RT-PCR (Flu A&B, Covid) Nasopharyngeal Swab     Status: None   Collection Time: 05/28/20  8:32 AM   Specimen: Nasopharyngeal Swab; Nasopharyngeal(NP) swabs in vial transport medium  Result Value Ref Range   SARS Coronavirus 2 by RT PCR NEGATIVE NEGATIVE    Comment: (NOTE) SARS-CoV-2 target nucleic acids are NOT DETECTED.  The SARS-CoV-2 RNA is generally detectable in upper respiratory specimens during the acute phase of infection. The lowest concentration of SARS-CoV-2 viral copies this assay can detect is 138 copies/mL. A negative result does not preclude SARS-Cov-2 infection and should not be used as the sole basis for treatment or other patient management decisions. A negative result may occur with  improper specimen collection/handling, submission of specimen other than nasopharyngeal swab, presence of  viral mutation(s) within the areas targeted by this assay, and inadequate number of viral copies(<138 copies/mL). A negative result must be combined with clinical observations, patient history, and epidemiological information. The expected result is  Negative.  Fact Sheet for Patients:  EntrepreneurPulse.com.au  Fact Sheet for Healthcare Providers:  IncredibleEmployment.be  This test is no t yet approved or cleared by the [place] FDA and  has been authorized for detection and/or diagnosis of SARS-CoV-2 by FDA under an Emergency Use Authorization (EUA). This EUA will remain  in effect (meaning this test can be used) for the duration of the COVID-19 declaration under Section 564(b)(1) of the Act, 21 U.S.C.section 360bbb-3(b)(1), unless the authorization is terminated  or revoked sooner.       Influenza A by PCR NEGATIVE NEGATIVE   Influenza B by PCR NEGATIVE NEGATIVE    Comment: (NOTE) The Xpert Xpress SARS-CoV-2/FLU/RSV plus assay is intended as an aid in the diagnosis of influenza from Nasopharyngeal swab specimens and should not be used as a sole basis for treatment. Nasal washings and aspirates are unacceptable for Xpert Xpress SARS-CoV-2/FLU/RSV testing.  Fact Sheet for Patients: EntrepreneurPulse.com.au  Fact Sheet for Healthcare Providers: IncredibleEmployment.be  This test is not yet approved or cleared by the [place] FDA and has been authorized for detection and/or diagnosis of SARS-CoV-2 by FDA under an Emergency Use Authorization (EUA). This EUA will remain in effect (meaning this test can be used) for the duration of the COVID-19 declaration under Section 564(b)(1) of the Act, 21 U.S.C. section 360bbb-3(b)(1), unless the authorization is terminated or revoked.  Performed at [facility], [place] [address] [place], [place] [address]     DG Chest 1 View  Result  Date: 05/28/2020 CLINICAL DATA:  87 year old female status post fall with left femoral neck fracture. EXAM: CHEST  1 VIEW COMPARISON:  Portable chest 03/30/2019 and earlier. FINDINGS: Supine AP views at 0744 hours. Chest is somewhat overpenetrated. Chronic pulmonary hyperinflation. Stable cardiomegaly and mediastinal contours. Allowing for portable technique the lungs are clear. No pneumothorax. Visualized tracheal air column is within normal limits. Advanced degenerative changes at the thoracolumbar junction with partially visible lumbar fusion hardware. No acute osseous abnormality identified in the chest. IMPRESSION: Pulmonary hyperinflation.  No acute cardiopulmonary abnormality. Electronically Signed   By: Genevie Ann M.D.   On: 05/28/2020 07:57   CT Head Wo Contrast  Result Date: 05/28/2020 CLINICAL DATA:  Head and neck trauma Unwitnessed fall Found on floor at nursing home Left forehead hematoma EXAM: CT HEAD WITHOUT CONTRAST CT CERVICAL SPINE WITHOUT CONTRAST TECHNIQUE: Multidetector CT imaging of the head and cervical spine was performed following the standard protocol without intravenous contrast. Multiplanar CT image reconstructions of the cervical spine were also generated. COMPARISON:  04/05/2020 FINDINGS: CT HEAD FINDINGS Brain: No evidence of acute infarction, hemorrhage, hydrocephalus, extra-axial collection or mass lesion/mass effect. Periventricular white matter hypodensity is a nonspecific finding, but most commonly relates to chronic ischemic small vessel disease. 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 Vascular: No hyperdense vessel or unexpected calcification. Skull: Normal. Negative for fracture or focal lesion. Sinuses/Orbits: No acute finding. Other: Large left frontal hematoma.  Small left posterior hematoma. CT CERVICAL SPINE FINDINGS Alignment: Grade 1 anterolisthesis of C3 on C4 and C4 on C5. Grade 1 retrolisthesis of C5 on C6. Grade 1 anterolisthesis  of C7 on T1. Skull base and vertebrae: Anterior subluxation of the dens relative to the tip of the clivus unchanged from prior examination and likely due to ligamentous laxity. No acute fracture or dislocation. Soft tissues and spinal canal: No acute fracture or dislocation is identified. Prevertebral soft tissues are normal thickness. Disc levels: Advanced degenerative changes again  seen throughout the cervical spine. Upper chest: Biapical pleuroparenchymal scarring. Other: None IMPRESSION: 1. Large left frontal scalp hematoma. No acute intracranial abnormality. 2. No acute fracture or dislocation of the cervical or visualized upper thoracic spine. Electronically Signed   By: Miachel Roux M.D.   On: 05/28/2020 08:22   CT Cervical Spine Wo Contrast  Result Date: 05/28/2020 CLINICAL DATA:  Head and neck trauma Unwitnessed fall Found on floor at nursing home Left forehead hematoma EXAM: CT HEAD WITHOUT CONTRAST CT CERVICAL SPINE WITHOUT CONTRAST TECHNIQUE: Multidetector CT imaging of the head and cervical spine was performed following the standard protocol without intravenous contrast. Multiplanar CT image reconstructions of the cervical spine were also generated. COMPARISON:  04/05/2020 FINDINGS: CT HEAD FINDINGS Brain: No evidence of acute infarction, hemorrhage, hydrocephalus, extra-axial collection or mass lesion/mass effect. Periventricular white matter hypodensity is a nonspecific finding, but most commonly relates to chronic ischemic small vessel disease. 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 Vascular: No hyperdense vessel or unexpected calcification. Skull: Normal. Negative for fracture or focal lesion. Sinuses/Orbits: No acute finding. Other: Large left frontal hematoma.  Small left posterior hematoma. CT CERVICAL SPINE FINDINGS Alignment: Grade 1 anterolisthesis of C3 on C4 and C4 on C5. Grade 1 retrolisthesis of C5 on C6. Grade 1 anterolisthesis of C7 on T1. Skull  base and vertebrae: Anterior subluxation of the dens relative to the tip of the clivus unchanged from prior examination and likely due to ligamentous laxity. No acute fracture or dislocation. Soft tissues and spinal canal: No acute fracture or dislocation is identified. Prevertebral soft tissues are normal thickness. Disc levels: Advanced degenerative changes again seen throughout the cervical spine. Upper chest: Biapical pleuroparenchymal scarring. Other: None IMPRESSION: 1. Large left frontal scalp hematoma. No acute intracranial abnormality. 2. No acute fracture or dislocation of the cervical or visualized upper thoracic spine. Electronically Signed   By: Miachel Roux M.D.   On: 05/28/2020 08:22   DG Hip Unilat With Pelvis 2-3 Views Left  Result Date: 05/28/2020 CLINICAL DATA:  87 year old female status post fall with left hip pain. EXAM: DG HIP (WITH OR WITHOUT PELVIS) 2-3V LEFT COMPARISON:  Abdominal radiographs 11/14/2014. FINDINGS: AP and cross-table lateral views demonstrate an acute left femoral neck fracture with impaction. Left femoral head remains normally located. Intertrochanteric segment remains intact. Grossly intact proximal right femur. No acute pelvic fracture identified. Chronic lumbar fusion hardware. Non obstructed visible bowel gas pattern. IMPRESSION: Acute, impacted left femoral neck fracture. Electronically Signed   By: Genevie Ann M.D.   On: 05/28/2020 07:55    Review of Systems  HENT: Negative for ear discharge, ear pain, hearing loss and tinnitus.   Eyes: Negative for photophobia and pain.  Respiratory: Negative for cough and shortness of breath.   Cardiovascular: Negative for chest pain.  Gastrointestinal: Negative for abdominal pain, nausea and vomiting.  Genitourinary: Negative for dysuria, flank pain, frequency and urgency.  Musculoskeletal: Positive for arthralgias (Left hip). Negative for back pain, myalgias and neck pain.  Neurological: Negative for dizziness and  headaches.  Hematological: Does not bruise/bleed easily.  Psychiatric/Behavioral: The patient is not nervous/anxious.    Blood pressure (!) 160/97, pulse (!) 107, temperature 97.8 F (36.6 C), resp. rate 16, SpO2 98 %. Physical Exam Constitutional:      General: She is not in acute distress.    Appearance: She is well-developed. She is not diaphoretic.  HENT:     Head: Normocephalic and atraumatic.  Eyes:  General: No scleral icterus.       Right eye: No discharge.        Left eye: No discharge.     Conjunctiva/sclera: Conjunctivae normal.  Cardiovascular:     Rate and Rhythm: Normal rate and regular rhythm.  Pulmonary:     Effort: Pulmonary effort is normal. No respiratory distress.  Musculoskeletal:     Cervical back: Normal range of motion.     Comments: LLE No traumatic wounds, ecchymosis, or rash  Mod TTP hip/knee  No knee or ankle effusion  Knee stable to varus/ valgus and anterior/posterior stress  Sens DPN, SPN, TN intact  Motor EHL, ext, flex, evers 5/5  DP 1+, PT 1+, No significant edema  Skin:    General: Skin is warm and dry.  Neurological:     Mental Status: She is alert.  Psychiatric:        Behavior: Behavior normal.     Assessment/Plan: Left hip fx -- Plan hip hemi, timing TBD, by Dr. Lyla Glassing. Spoke with son, informed him of dx and tentative plan for surgery in the next 72h. Multiple medical problems including early dementia, depression, neuropathy, Afib, and chronic pain -- Will need medical admission and clearance. Appreciate their help.    Lisette Abu, PA-C Orthopedic Surgery [phone] 05/28/2020, 10:05 AM

## 2020-05-28 NOTE — Progress Notes (Signed)
Orthopedic Tech Progress Note Patient Details:  Vanessa [name] 1920-12-24 [id number]  Patient ID: Eulah Citizen, female   DOB: [birth year], 87 y.o.   MRN: [id number]   [place] Pink 05/28/2020, 8:14 PMPatient can not use Trapeze bar

## 2020-05-28 NOTE — ED Notes (Signed)
Patient is in pain MD notified. Patient unable to take PO tylenol. Due to patient laying flat and every time staff attempts to sit her up patient started screaming. MD made aware.

## 2020-05-28 NOTE — ED Notes (Addendum)
On rounding patients Right forearm IV had been infiltrated and brusied. IV pulled and new IV placed on left arm. Patient also still in pain.

## 2020-05-28 NOTE — H&P (Signed)
History and Physical        Hospital Admission Note Date: 05/28/2020  Patient name: Vanessa [name] Medical record number: [id number] Date of birth: 02/17/1920 Age: 87 y.o. Gender: female  PCP: [name], MD  Patient coming from: [facility]   Chief Complaint    Chief Complaint  Patient presents with  . Fall      HPI:   This is an 87 year old with a past medical history of atrial fibrillation on aspirin, left carotid artery occlusion, peripheral neuropathy, Zenker diverticulum, dementia who presented to the ED from SNF after staff heard her yelling from the floor and pain on attempting to sit upright.  She was found to have a large hematoma on the left side of her forehead as well prompting her to come to the ED due to staff concern for unwitnessed fall.  Initially on presentation, the patient was unable to state her name or date of birth and only mumbles when speaking.  However, currently she states that she remembers falling from her bed while sleeping and hitting her left hip.  She is currently complaining of left hip pain.   ED Course: Afebrile, tachycardic, tachypneic,  hypertensive,. Notable Labs: WBC 19.9, COVID-19 and flu negative and otherwise labs unremarkable. Notable Imaging: Left hip XR-acute impacted left femoral neck fracture.  CXR-pulmonary hyperinflation.  CT head and cervical spine- large left frontal scalp hematoma otherwise unremarkable. Patient received fentanyl, Lopressor, morphine.  Orthopedic surgery consulted by the ED provider but has not returned call just yet.    Vitals:   05/28/20 1315 05/28/20 1330  BP: 116/77 114/72  Pulse: (!) 125 (!) 115  Resp: 18   Temp:    SpO2: 97% 93%     Review of Systems:  Review of Systems  All other systems reviewed and are negative.   Medical/Social/Family History   Past Medical History: Past  Medical History:  Diagnosis Date  . Abdominal bloating   . Atrial fibrillation ([place])   . Bruises easily   . Carotid artery occlusion    LEFT  . Cricopharyngeal dysphagia   . Dizziness   . DOE (dyspnea on exertion) 04/02/2014  . Fall at home Sept 2013, Dec. 2013  Jun 08, 2012  . GERD (gastroesophageal reflux disease) 02/11/2015  . Headache(784.0)   . Hoarseness   . Hypercholesterolemia   . Hyperglycemia 04/02/2014   Glucose 178 mg percent on 01/22/2014 04/05/14 Hgb A1c 6.6 Diet controlled.    . Hypertensive retinopathy    OU  . Hypothyroidism 04/15/2015  . Macular degeneration    Dry OU  . Neuropathy    PERIPHERAL  . Pruritus   . Scoliosis   . Varicose veins   . Zenker's diverticulum     Past Surgical History:  Procedure Laterality Date  . ABDOMINAL HYSTERECTOMY  1954  . CATARACT EXTRACTION Bilateral   . CHOLECYSTECTOMY  1997  . ESOPHAGOGASTRODUODENOSCOPY N/A 03/30/2019   Procedure: ESOPHAGOGASTRODUODENOSCOPY (EGD);  Surgeon: Jerene Bears, MD;  Location: [facility];  Service: Gastroenterology;  Laterality: N/A;  . ESOPHAGOGASTRODUODENOSCOPY (EGD) WITH PROPOFOL N/A 11/25/2016   Procedure: ESOPHAGOGASTRODUODENOSCOPY (EGD) WITH PROPOFOL;  Surgeon: [name], MD;  Location: [facility];  Service: Endoscopy;  Laterality: N/A;  . EYE SURGERY Bilateral    Cat Sx  . SPINE SURGERY  1997   correct scoliosis    Medications: Prior to Admission medications   Medication Sig Start Date End Date Taking? Authorizing Provider  acetaminophen (TYLENOL) 325 MG tablet Take 650 mg by mouth every 8 (eight) hours as needed.    [provider]  acetaminophen (TYLENOL) 500 MG tablet Take 500 mg by mouth 2 (two) times daily. Patient not taking: Reported on 05/14/2020    [provider]  antiseptic oral rinse (BIOTENE) LIQD 5 mLs by Mouth Rinse route in the morning and at bedtime.    [provider]  aspirin 81 MG chewable tablet Chew 81 mg by mouth daily.     [provider]  atenolol (TENORMIN) 25 MG tablet Take 12.5 mg by mouth 2 (two) times daily. Hold if SBP < 100 or HR < 60    [provider]  Biotin 5000 MCG TABS Take 5,000 mcg by mouth daily.    [provider]  Calcium-Vitamin D-Vitamin K (VIACTIV PO) Take 1 tablet by mouth daily. 02/28/18   [provider]  famotidine (PEPCID) 20 MG tablet Take 20 mg by mouth daily.    [provider]  fluticasone (FLONASE) 50 MCG/ACT nasal spray Place 2 sprays into both nostrils daily.    [provider]  lactose free nutrition (BOOST PLUS) LIQD Take 237 mLs by mouth 2 (two) times daily with a meal. Per request of resident, states can't eat if takes it later    [provider]  loratadine (CLARITIN) 10 MG tablet Take 10 mg by mouth daily as needed for allergies.    [provider]  Menthol, Topical Analgesic, 4 % GEL Apply 1 application topically every 8 (eight) hours. Apply to LLE    [provider]  Pediatric Multiple Vitamins (THERA MULTI-VITAMIN PO) Take 1 tablet by mouth daily.    [provider]  Peppermint Oil (IBGARD) 90 MG CPCR Take 2 capsules by mouth 2 (two) times daily.  09/26/18   [provider]  polyethylene glycol (MIRALAX / GLYCOLAX) packet Take 8.5-17 g by mouth See admin instructions. 1/2 dose QAM scheduled, 1 dose qd prn    [provider]  pregabalin (LYRICA) 25 MG capsule Take 1 capsule (25 mg total) by mouth 2 (two) times daily. 03/18/20   Mast, Man X, NP  pregabalin (LYRICA) 50 MG capsule Take 25 mg by mouth at bedtime. Patient not taking: Reported on 05/14/2020    [provider]  Propylene Glycol (SYSTANE BALANCE) 0.6 % SOLN Give 1 drop to both eyes once daily as needed for dryness    [provider]  Riboflavin (VITAMIN B-2) 25 MG TABS Take 1 tablet by mouth daily.    [provider]  sennosides-docusate sodium (SENOKOT-S) 8.6-50 MG tablet Take 2 tablets by  mouth daily.     [provider]  sodium phosphate (FLEET) 7-19 GM/118ML ENEM Place 1 enema rectally as needed for severe constipation. Give Fleet enema per rectum X 1 after removing hard stool or if soft stool is present in rectum.    [provider]  traMADol (ULTRAM) 50 MG tablet Take 1 tablet (50 mg total) by mouth 2 (two) times daily. 03/11/20   [name], MD  triamcinolone cream (KENALOG) 0.1 % Apply 1 application topically as needed. Apply to itchy areas as needed. May keep in room and self apply  [provider]  zinc oxide 20 % ointment Apply 1 application topically as needed for irritation.    [provider]    Allergies:  No Known Allergies  Social History:  reports that she has never smoked. She has never used smokeless tobacco. She reports that she does not drink alcohol and does not use drugs.  Family History: Family History  Adopted: Yes  Problem Relation Age of Onset  . Diabetes Mother   . Heart attack Mother   . Heart disease Mother        After age 29  . Heart attack Father   . Stroke Father   . Breast cancer Sister   . Heart disease Maternal Grandmother   . Cancer Son        adopted son. esophagus, stomach, liver  . Stomach cancer Neg Hx   . Colon cancer Neg Hx      Objective   Physical Exam: Blood pressure 114/72, pulse (!) 115, temperature 97.8 F (36.6 C), resp. rate 18, SpO2 93 %.  Physical Exam Vitals and nursing note reviewed.  Constitutional:      Appearance: Normal appearance. She is not ill-appearing.     Comments: Frail elderly female  HENT:     Head: Normocephalic and atraumatic.  Eyes:     Conjunctiva/sclera: Conjunctivae normal.  Cardiovascular:     Rate and Rhythm: Tachycardia present. Rhythm irregular.  Pulmonary:     Effort: Pulmonary effort is normal.     Breath sounds: Normal breath sounds.  Abdominal:     General: Abdomen is flat.     Palpations: Abdomen is soft.  Musculoskeletal:      Comments: Left hip externally rotated and shortened  Skin:    Coloration: Skin is not jaundiced or pale.  Neurological:     Mental Status: She is alert. Mental status is at baseline.     Comments: Oriented to person and place  Psychiatric:        Mood and Affect: Mood normal.        Behavior: Behavior normal.     LABS on Admission: I have personally reviewed all the labs and imaging below    Basic Metabolic Panel: Recent Labs  Lab 05/28/20 0831  NA 136  K 4.8  CL 100  CO2 28  GLUCOSE 130*  BUN 22  CREATININE 0.90  CALCIUM 9.2   Liver Function Tests: Recent Labs  Lab 05/28/20 0831  AST 43*  ALT 18  ALKPHOS 64  BILITOT 1.0  PROT 6.9  ALBUMIN 3.7   No results for input(s): LIPASE, AMYLASE in the last 168 hours. No results for input(s): AMMONIA in the last 168 hours. CBC: Recent Labs  Lab 05/28/20 0831  WBC 19.9*  NEUTROABS 17.4*  HGB 13.6  HCT 41.2  MCV 105.1*  PLT 215   Cardiac Enzymes: No results for input(s): CKTOTAL, CKMB, CKMBINDEX, TROPONINI in the last 168 hours. BNP: Invalid input(s): POCBNP CBG: No results for input(s): GLUCAP in the last 168 hours.  Radiological Exams on Admission:  DG Chest 1 View  Result Date: 05/28/2020 CLINICAL DATA:  87 year old female status post fall with left femoral neck fracture. EXAM: CHEST  1 VIEW COMPARISON:  Portable chest 03/30/2019 and earlier. FINDINGS: Supine AP views at 0744 hours. Chest is somewhat overpenetrated. Chronic pulmonary hyperinflation. Stable cardiomegaly and mediastinal contours. Allowing for portable technique the lungs are clear. No pneumothorax. Visualized tracheal air column is within normal limits. Advanced degenerative changes at the thoracolumbar  junction with partially visible lumbar fusion hardware. No acute osseous abnormality identified in the chest. IMPRESSION: Pulmonary hyperinflation.  No acute cardiopulmonary abnormality. Electronically Signed   By: Genevie Ann M.D.   On: 05/28/2020  07:57   CT Head Wo Contrast  Result Date: 05/28/2020 CLINICAL DATA:  Head and neck trauma Unwitnessed fall Found on floor at nursing home Left forehead hematoma EXAM: CT HEAD WITHOUT CONTRAST CT CERVICAL SPINE WITHOUT CONTRAST TECHNIQUE: Multidetector CT imaging of the head and cervical spine was performed following the standard protocol without intravenous contrast. Multiplanar CT image reconstructions of the cervical spine were also generated. COMPARISON:  04/05/2020 FINDINGS: CT HEAD FINDINGS Brain: No evidence of acute infarction, hemorrhage, hydrocephalus, extra-axial collection or mass lesion/mass effect. Periventricular white matter hypodensity is a nonspecific finding, but most commonly relates to chronic ischemic small vessel disease. 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 Vascular: No hyperdense vessel or unexpected calcification. Skull: Normal. Negative for fracture or focal lesion. Sinuses/Orbits: No acute finding. Other: Large left frontal hematoma.  Small left posterior hematoma. CT CERVICAL SPINE FINDINGS Alignment: Grade 1 anterolisthesis of C3 on C4 and C4 on C5. Grade 1 retrolisthesis of C5 on C6. Grade 1 anterolisthesis of C7 on T1. Skull base and vertebrae: Anterior subluxation of the dens relative to the tip of the clivus unchanged from prior examination and likely due to ligamentous laxity. No acute fracture or dislocation. Soft tissues and spinal canal: No acute fracture or dislocation is identified. Prevertebral soft tissues are normal thickness. Disc levels: Advanced degenerative changes again seen throughout the cervical spine. Upper chest: Biapical pleuroparenchymal scarring. Other: None IMPRESSION: 1. Large left frontal scalp hematoma. No acute intracranial abnormality. 2. No acute fracture or dislocation of the cervical or visualized upper thoracic spine. Electronically Signed   By: Miachel Roux M.D.   On: 05/28/2020 08:22   CT Cervical Spine Wo  Contrast  Result Date: 05/28/2020 CLINICAL DATA:  Head and neck trauma Unwitnessed fall Found on floor at nursing home Left forehead hematoma EXAM: CT HEAD WITHOUT CONTRAST CT CERVICAL SPINE WITHOUT CONTRAST TECHNIQUE: Multidetector CT imaging of the head and cervical spine was performed following the standard protocol without intravenous contrast. Multiplanar CT image reconstructions of the cervical spine were also generated. COMPARISON:  04/05/2020 FINDINGS: CT HEAD FINDINGS Brain: No evidence of acute infarction, hemorrhage, hydrocephalus, extra-axial collection or mass lesion/mass effect. Periventricular white matter hypodensity is a nonspecific finding, but most commonly relates to chronic ischemic small vessel disease. 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 Vascular: No hyperdense vessel or unexpected calcification. Skull: Normal. Negative for fracture or focal lesion. Sinuses/Orbits: No acute finding. Other: Large left frontal hematoma.  Small left posterior hematoma. CT CERVICAL SPINE FINDINGS Alignment: Grade 1 anterolisthesis of C3 on C4 and C4 on C5. Grade 1 retrolisthesis of C5 on C6. Grade 1 anterolisthesis of C7 on T1. Skull base and vertebrae: Anterior subluxation of the dens relative to the tip of the clivus unchanged from prior examination and likely due to ligamentous laxity. No acute fracture or dislocation. Soft tissues and spinal canal: No acute fracture or dislocation is identified. Prevertebral soft tissues are normal thickness. Disc levels: Advanced degenerative changes again seen throughout the cervical spine. Upper chest: Biapical pleuroparenchymal scarring. Other: None IMPRESSION: 1. Large left frontal scalp hematoma. No acute intracranial abnormality. 2. No acute fracture or dislocation of the cervical or visualized upper thoracic spine. Electronically Signed   By: Danie Binder.D.  On: 05/28/2020 08:22   DG Knee Complete 4 Views Left  Result  Date: 05/28/2020 CLINICAL DATA:  87 year old female status post fall with left femoral neck fracture. EXAM: LEFT KNEE - COMPLETE 4+ VIEW COMPARISON:  Left hip series today.  Left knee series 12/01/2016. FINDINGS: AP oblique and cross-table lateral views. Joint spaces and alignment remain normal for age. No acute osseous abnormality identified. No joint effusion. Bone mineralization is within normal limits for age. IMPRESSION: No acute fracture or dislocation identified about the left knee. Electronically Signed   By: Genevie Ann M.D.   On: 05/28/2020 10:47   DG Hip Unilat With Pelvis 2-3 Views Left  Result Date: 05/28/2020 CLINICAL DATA:  87 year old female status post fall with left hip pain. EXAM: DG HIP (WITH OR WITHOUT PELVIS) 2-3V LEFT COMPARISON:  Abdominal radiographs 11/14/2014. FINDINGS: AP and cross-table lateral views demonstrate an acute left femoral neck fracture with impaction. Left femoral head remains normally located. Intertrochanteric segment remains intact. Grossly intact proximal right femur. No acute pelvic fracture identified. Chronic lumbar fusion hardware. Non obstructed visible bowel gas pattern. IMPRESSION: Acute, impacted left femoral neck fracture. Electronically Signed   By: Genevie Ann M.D.   On: 05/28/2020 07:55      EKG:  atrial fibrillation, rate 110    A & P   Principal Problem:   Femur fracture (HCC) Active Problems:   Carotid stenosis   Atrial fibrillation with RVR (HCC)   Secondary diabetes mellitus with stage 3 chronic kidney disease (GFR 30-59) (HCC)   Vascular dementia (HCC)   CKD (chronic kidney disease) stage 3, GFR 30-59 ml/min (HCC)   1. Acute impacted left femoral neck fracture suspect secondary to unwitnessed fall a. Per the NSQIP surgical risk calculator: given the patient's age and comorbidities (HTN, DM, Afib, functional status, etc.), she is at increased risk for perioperative complications from this noncardiac surgical procedure. No further  presurgical testing needed. b. Per orthopedic surgery: Plan for surgery tomorrow. Can give Lovenox 1 time only now c. Continue pain regimen per hip fracture order set  2. Atrial fibrillation with RVR a. HR between 80s to low 100s b. CHA2DS2-VASc at least 6 (female, age, hypertension, DM, vascular disease) c. On aspirin at home d. Continue home atenolol and as needed Lopressor  3. CKD 3, at baseline  4. Vascular Dementia, at baseline  5. Leukocytosis, likely reactive to fracture  6. Diabetes, diet controlled   DVT prophylaxis: Lovenox x1   Code Status: DNR  Diet: Heart healthy, n.p.o. after midnight Family Communication: Admission, patients condition and plan of care including tests being ordered have been discussed with the patient who indicates understanding and agrees with the plan and Code Status. Patient's son was updated by surgery Disposition Plan: The appropriate patient status for this patient is INPATIENT. Inpatient status is judged to be reasonable and necessary in order to provide the required intensity of service to ensure the patient's safety. The patient's presenting symptoms, physical exam findings, and initial radiographic and laboratory data in the context of their chronic comorbidities is felt to place them at high risk for further clinical deterioration. Furthermore, it is not anticipated that the patient will be medically stable for discharge from the hospital within 2 midnights of admission. The following factors support the patient status of inpatient.   " The patient's presenting symptoms include left hip pain. " The worrisome physical exam findings include left hip external rotation and pain. " The initial radiographic and laboratory data are worrisome  because of left hip fracture. " The chronic co-morbidities include dementia, HTN, DM, otherwise as above.   * I certify that at the point of admission it is my clinical judgment that the patient will require  inpatient hospital care spanning beyond 2 midnights from the point of admission due to high intensity of service, high risk for further deterioration and high frequency of surveillance required.*   Status is: Inpatient  Remains inpatient appropriate because:Ongoing active pain requiring inpatient pain management, Ongoing diagnostic testing needed not appropriate for outpatient work up and Inpatient level of care appropriate due to severity of illness   Dispo: The patient is from: SNF              Anticipated d/c is to: SNF              Patient currently is not medically stable to d/c.   Difficult to place patient No      Consultants  . Orthopedic surgery  Procedures  . none  Time Spent on Admission: 60 minutes    Harold Hedge, DO [facility]  05/28/2020, 1:42 PM

## 2020-05-29 ENCOUNTER — Inpatient Hospital Stay (HOSPITAL_COMMUNITY): Payer: Medicare Other | Admitting: Anesthesiology

## 2020-05-29 ENCOUNTER — Encounter (HOSPITAL_COMMUNITY)
Admission: EM | Disposition: A | Discharge status: Skilled Nursing Facility | Payer: Self-pay | Source: Skilled Nursing Facility | Attending: Internal Medicine

## 2020-05-29 ENCOUNTER — Inpatient Hospital Stay (HOSPITAL_COMMUNITY): Payer: Medicare Other

## 2020-05-29 DIAGNOSIS — I6522 Occlusion and stenosis of left carotid artery: Secondary | ICD-10-CM | POA: Diagnosis not present

## 2020-05-29 DIAGNOSIS — I4891 Unspecified atrial fibrillation: Secondary | ICD-10-CM | POA: Diagnosis not present

## 2020-05-29 DIAGNOSIS — S7292XA Unspecified fracture of left femur, initial encounter for closed fracture: Secondary | ICD-10-CM | POA: Diagnosis not present

## 2020-05-29 DIAGNOSIS — N183 Chronic kidney disease, stage 3 unspecified: Secondary | ICD-10-CM | POA: Diagnosis not present

## 2020-05-29 HISTORY — PX: ANTERIOR APPROACH HEMI HIP ARTHROPLASTY: SHX6690

## 2020-05-29 LAB — BASIC METABOLIC PANEL
Anion gap: 7 (ref 5–15)
BUN: 25 mg/dL — ABNORMAL HIGH (ref 8–23)
CO2: 29 mmol/L (ref 22–32)
Calcium: 9.4 mg/dL (ref 8.9–10.3)
Chloride: 104 mmol/L (ref 98–111)
Creatinine, Ser: 0.87 mg/dL (ref 0.44–1.00)
GFR, Estimated: >60 mL/min (ref >60)
Glucose, Bld: 141 mg/dL — ABNORMAL HIGH (ref 70–99)
Potassium: 4.6 mmol/L (ref 3.5–5.1)
Sodium: 140 mmol/L (ref 135–145)

## 2020-05-29 LAB — CBC
HCT: 41.7 % (ref 36.0–46.0)
Hemoglobin: 13.6 g/dL (ref 12.0–15.0)
MCH: 35.4 pg — ABNORMAL HIGH (ref 26.0–34.0)
MCHC: 32.6 g/dL (ref 30.0–36.0)
MCV: 108.6 fL — ABNORMAL HIGH (ref 80.0–100.0)
Platelets: 195 10*3/uL (ref 150–400)
RBC: 3.84 MIL/uL — ABNORMAL LOW (ref 3.87–5.11)
RDW: 13.9 % (ref 11.5–15.5)
WBC: 18 10*3/uL — ABNORMAL HIGH (ref 4.0–10.5)
nRBC: 0 % (ref 0.0–0.2)

## 2020-05-29 SURGERY — HEMIARTHROPLASTY, HIP, DIRECT ANTERIOR APPROACH, FOR FRACTURE
Anesthesia: General | Laterality: Left

## 2020-05-29 MED ORDER — SODIUM CHLORIDE 0.9 % IV SOLN
1.0000 g | Freq: Two times a day (BID) | INTRAVENOUS | Status: AC
  Administered 2020-05-30 (×2): 1 g via INTRAVENOUS
  Filled 2020-05-29: qty 10
  Filled 2020-05-29: qty 1

## 2020-05-29 MED ORDER — AMISULPRIDE (ANTIEMETIC) 5 MG/2ML IV SOLN: 10.0000 mg | Freq: Once | INTRAVENOUS | Status: DC | PRN

## 2020-05-29 MED ORDER — PROSOURCE PLUS PO LIQD
30.0000 mL | Freq: Every day | ORAL | Status: DC
  Administered 2020-05-30 – 2020-05-31 (×2): 30 mL via ORAL
  Filled 2020-05-29 (×2): qty 30

## 2020-05-29 MED ORDER — DILTIAZEM HCL-DEXTROSE 125-5 MG/125ML-% IV SOLN (PREMIX)
5.0000 mg/h | INTRAVENOUS | Status: DC
  Administered 2020-05-29: 5 mg/h via INTRAVENOUS
  Filled 2020-05-29 (×3): qty 125

## 2020-05-29 MED ORDER — 0.9 % SODIUM CHLORIDE (POUR BTL) OPTIME
TOPICAL | Status: DC | PRN
  Administered 2020-05-29: 1000 mL

## 2020-05-29 MED ORDER — ADULT MULTIVITAMIN W/MINERALS CH
1.0000 | ORAL_TABLET | Freq: Every day | ORAL | Status: DC
  Administered 2020-05-30: 1 via ORAL
  Filled 2020-05-29: qty 1

## 2020-05-29 MED ORDER — ONDANSETRON HCL 4 MG PO TABS: 4.0000 mg | ORAL_TABLET | Freq: Four times a day (QID) | ORAL | Status: DC | PRN

## 2020-05-29 MED ORDER — FENTANYL CITRATE (PF) 100 MCG/2ML IJ SOLN
INTRAMUSCULAR | Status: AC
  Filled 2020-05-29: qty 2

## 2020-05-29 MED ORDER — POVIDONE-IODINE 10 % EX SWAB: 2.0000 "application " | Freq: Once | CUTANEOUS | Status: DC

## 2020-05-29 MED ORDER — SODIUM CHLORIDE (PF) 0.9 % IJ SOLN
INTRAMUSCULAR | Status: DC | PRN
  Administered 2020-05-29: 30 mL
  Administered 2020-05-29: 50 mL

## 2020-05-29 MED ORDER — CHLORHEXIDINE GLUCONATE CLOTH 2 % EX PADS
6.0000 | MEDICATED_PAD | Freq: Every day | CUTANEOUS | Status: DC
  Administered 2020-05-29 – 2020-05-30 (×2): 6 via TOPICAL

## 2020-05-29 MED ORDER — TRANEXAMIC ACID-NACL 1000-0.7 MG/100ML-% IV SOLN
1000.0000 mg | INTRAVENOUS | Status: AC
  Administered 2020-05-29: 1000 mg via INTRAVENOUS

## 2020-05-29 MED ORDER — PHENYLEPHRINE 40 MCG/ML (10ML) SYRINGE FOR IV PUSH (FOR BLOOD PRESSURE SUPPORT)
PREFILLED_SYRINGE | INTRAVENOUS | Status: AC
  Filled 2020-05-29: qty 20

## 2020-05-29 MED ORDER — HYDROMORPHONE HCL 1 MG/ML IJ SOLN
INTRAMUSCULAR | Status: AC
  Filled 2020-05-29: qty 1

## 2020-05-29 MED ORDER — SODIUM CHLORIDE (PF) 0.9 % IJ SOLN
INTRAMUSCULAR | Status: AC
  Filled 2020-05-29: qty 30

## 2020-05-29 MED ORDER — ROCURONIUM BROMIDE 10 MG/ML (PF) SYRINGE
PREFILLED_SYRINGE | INTRAVENOUS | Status: DC | PRN
  Administered 2020-05-29: 40 mg via INTRAVENOUS

## 2020-05-29 MED ORDER — KETOROLAC TROMETHAMINE 30 MG/ML IJ SOLN
INTRAMUSCULAR | Status: DC | PRN
  Administered 2020-05-29: 30 mg

## 2020-05-29 MED ORDER — PROPOFOL 10 MG/ML IV BOLUS
INTRAVENOUS | Status: AC
  Filled 2020-05-29: qty 20

## 2020-05-29 MED ORDER — TRANEXAMIC ACID-NACL 1000-0.7 MG/100ML-% IV SOLN
INTRAVENOUS | Status: AC
  Filled 2020-05-29: qty 100

## 2020-05-29 MED ORDER — METOCLOPRAMIDE HCL 5 MG/ML IJ SOLN
5.0000 mg | Freq: Three times a day (TID) | INTRAMUSCULAR | Status: DC | PRN
Start: 2020-05-29 — End: 2020-06-01

## 2020-05-29 MED ORDER — LIDOCAINE 2% (20 MG/ML) 5 ML SYRINGE
INTRAMUSCULAR | Status: DC | PRN
  Administered 2020-05-29: 40 mg via INTRAVENOUS

## 2020-05-29 MED ORDER — ENOXAPARIN SODIUM 30 MG/0.3ML IJ SOSY
30.0000 mg | PREFILLED_SYRINGE | INTRAMUSCULAR | Status: DC
  Administered 2020-05-30 – 2020-05-31 (×2): 30 mg via SUBCUTANEOUS
  Filled 2020-05-29 (×3): qty 0.3

## 2020-05-29 MED ORDER — METOPROLOL TARTRATE 5 MG/5ML IV SOLN
INTRAVENOUS | Status: DC | PRN
  Administered 2020-05-29 (×2): 1 mg via INTRAVENOUS

## 2020-05-29 MED ORDER — CEFAZOLIN SODIUM-DEXTROSE 2-4 GM/100ML-% IV SOLN
2.0000 g | INTRAVENOUS | Status: AC
Start: 2020-05-29 — End: 2020-05-29
  Administered 2020-05-29: 2 g via INTRAVENOUS

## 2020-05-29 MED ORDER — ONDANSETRON HCL 4 MG/2ML IJ SOLN: 4.0000 mg | Freq: Four times a day (QID) | INTRAMUSCULAR | Status: DC | PRN

## 2020-05-29 MED ORDER — MIDAZOLAM HCL 2 MG/2ML IJ SOLN
INTRAMUSCULAR | Status: AC
  Filled 2020-05-29: qty 2

## 2020-05-29 MED ORDER — METOCLOPRAMIDE HCL 5 MG PO TABS
5.0000 mg | ORAL_TABLET | Freq: Three times a day (TID) | ORAL | Status: DC | PRN
Start: 2020-05-29 — End: 2020-06-01

## 2020-05-29 MED ORDER — MENTHOL 3 MG MT LOZG: 1.0000 | LOZENGE | OROMUCOSAL | Status: DC | PRN

## 2020-05-29 MED ORDER — KETOROLAC TROMETHAMINE 30 MG/ML IJ SOLN
INTRAMUSCULAR | Status: AC
  Filled 2020-05-29: qty 1

## 2020-05-29 MED ORDER — HALOPERIDOL LACTATE 5 MG/ML IJ SOLN: 1.0000 mg | Freq: Four times a day (QID) | INTRAMUSCULAR | Status: DC | PRN

## 2020-05-29 MED ORDER — HALOPERIDOL 1 MG PO TABS
1.0000 mg | ORAL_TABLET | Freq: Four times a day (QID) | ORAL | Status: DC | PRN
  Administered 2020-05-30 (×2): 1 mg via ORAL
  Filled 2020-05-29 (×3): qty 1

## 2020-05-29 MED ORDER — LACTATED RINGERS IV SOLN: INTRAVENOUS | Status: DC

## 2020-05-29 MED ORDER — ESMOLOL HCL 100 MG/10ML IV SOLN
INTRAVENOUS | Status: DC | PRN
  Administered 2020-05-29 (×2): 20 mg via INTRAVENOUS
  Administered 2020-05-29: 30 mg via INTRAVENOUS
  Administered 2020-05-29: 20 mg via INTRAVENOUS

## 2020-05-29 MED ORDER — PROPOFOL 10 MG/ML IV BOLUS
INTRAVENOUS | Status: DC | PRN
  Administered 2020-05-29: 50 mg via INTRAVENOUS

## 2020-05-29 MED ORDER — ROCURONIUM BROMIDE 10 MG/ML (PF) SYRINGE
PREFILLED_SYRINGE | INTRAVENOUS | Status: AC
  Filled 2020-05-29: qty 10

## 2020-05-29 MED ORDER — FENTANYL CITRATE (PF) 100 MCG/2ML IJ SOLN
25.0000 ug | INTRAMUSCULAR | Status: DC | PRN
  Administered 2020-05-29: 25 ug via INTRAVENOUS
  Administered 2020-05-29: 50 ug via INTRAVENOUS
  Administered 2020-05-29: 25 ug via INTRAVENOUS

## 2020-05-29 MED ORDER — IRRISEPT - 450ML BOTTLE WITH 0.05% CHG IN STERILE WATER, USP 99.95% OPTIME
TOPICAL | Status: DC | PRN
  Administered 2020-05-29: 450 mL

## 2020-05-29 MED ORDER — PHENOL 1.4 % MT LIQD: 1.0000 | OROMUCOSAL | Status: DC | PRN

## 2020-05-29 MED ORDER — HYDROMORPHONE HCL 1 MG/ML IJ SOLN
0.2500 mg | Freq: Once | INTRAMUSCULAR | Status: AC
  Administered 2020-05-29: 0.25 mg via INTRAVENOUS

## 2020-05-29 MED ORDER — TRANEXAMIC ACID-NACL 1000-0.7 MG/100ML-% IV SOLN
1000.0000 mg | Freq: Once | INTRAVENOUS | Status: AC
  Administered 2020-05-29: 1000 mg via INTRAVENOUS
  Filled 2020-05-29: qty 100

## 2020-05-29 MED ORDER — BUPIVACAINE-EPINEPHRINE (PF) 0.25% -1:200000 IJ SOLN
INTRAMUSCULAR | Status: AC
  Filled 2020-05-29: qty 30

## 2020-05-29 MED ORDER — SUGAMMADEX SODIUM 200 MG/2ML IV SOLN
INTRAVENOUS | Status: DC | PRN
  Administered 2020-05-29: 150 mg via INTRAVENOUS

## 2020-05-29 MED ORDER — FENTANYL CITRATE (PF) 100 MCG/2ML IJ SOLN
INTRAMUSCULAR | Status: DC | PRN
  Administered 2020-05-29 (×4): 50 ug via INTRAVENOUS

## 2020-05-29 MED ORDER — PHENYLEPHRINE HCL (PRESSORS) 10 MG/ML IV SOLN
INTRAVENOUS | Status: DC | PRN
  Administered 2020-05-29 (×2): 40 ug via INTRAVENOUS
  Administered 2020-05-29: 200 ug via INTRAVENOUS
  Administered 2020-05-29: 40 ug via INTRAVENOUS

## 2020-05-29 MED ORDER — CEFAZOLIN IN SODIUM CHLORIDE 3-0.9 GM/100ML-% IV SOLN: 3.0000 g | INTRAVENOUS | Status: DC

## 2020-05-29 MED ORDER — PHENYLEPHRINE 40 MCG/ML (10ML) SYRINGE FOR IV PUSH (FOR BLOOD PRESSURE SUPPORT)
PREFILLED_SYRINGE | INTRAVENOUS | Status: DC | PRN
  Administered 2020-05-29: 120 ug via INTRAVENOUS

## 2020-05-29 MED ORDER — DEXAMETHASONE SODIUM PHOSPHATE 10 MG/ML IJ SOLN
INTRAMUSCULAR | Status: DC | PRN
  Administered 2020-05-29: 5 mg via INTRAVENOUS

## 2020-05-29 MED ORDER — DILTIAZEM HCL 60 MG PO TABS: 60.0000 mg | ORAL_TABLET | Freq: Three times a day (TID) | ORAL | Status: DC

## 2020-05-29 MED ORDER — CEFAZOLIN SODIUM-DEXTROSE 2-4 GM/100ML-% IV SOLN
INTRAVENOUS | Status: AC
  Filled 2020-05-29: qty 100

## 2020-05-29 MED ORDER — ENSURE SURGERY PO LIQD
237.0000 mL | Freq: Two times a day (BID) | ORAL | Status: DC
  Administered 2020-05-30 – 2020-06-01 (×5): 237 mL via ORAL
  Filled 2020-05-29 (×6): qty 237

## 2020-05-29 MED ORDER — PHENYLEPHRINE HCL-NACL 10-0.9 MG/250ML-% IV SOLN
INTRAVENOUS | Status: DC | PRN
  Administered 2020-05-29: 30 ug/min via INTRAVENOUS

## 2020-05-29 MED ORDER — DOCUSATE SODIUM 100 MG PO CAPS
100.0000 mg | ORAL_CAPSULE | Freq: Two times a day (BID) | ORAL | Status: DC
  Administered 2020-05-30 – 2020-06-01 (×2): 100 mg via ORAL
  Filled 2020-05-29 (×4): qty 1

## 2020-05-29 MED ORDER — SODIUM CHLORIDE 0.9 % IR SOLN
Status: DC | PRN
  Administered 2020-05-29: 1000 mL

## 2020-05-29 MED ORDER — CHLORHEXIDINE GLUCONATE 4 % EX LIQD: 60.0000 mL | Freq: Once | CUTANEOUS | Status: DC

## 2020-05-29 MED ORDER — PHENYLEPHRINE HCL-NACL 10-0.9 MG/250ML-% IV SOLN
INTRAVENOUS | Status: AC
  Filled 2020-05-29: qty 250

## 2020-05-29 MED ORDER — CEFAZOLIN SODIUM-DEXTROSE 2-4 GM/100ML-% IV SOLN: 2.0000 g | Freq: Four times a day (QID) | INTRAVENOUS | Status: DC

## 2020-05-29 MED ORDER — BUPIVACAINE-EPINEPHRINE (PF) 0.5% -1:200000 IJ SOLN
INTRAMUSCULAR | Status: DC | PRN
  Administered 2020-05-29: 30 mL

## 2020-05-29 SURGICAL SUPPLY — 58 items
ADH SKN CLS APL DERMABOND .7 (GAUZE/BANDAGES/DRESSINGS) ×2
APL PRP STRL LF DISP 70% ISPRP (MISCELLANEOUS) ×1
BLADE CLIPPER SURG (BLADE) IMPLANT
CHLORAPREP W/TINT 26 (MISCELLANEOUS) ×2 IMPLANT
COVER SURGICAL LIGHT HANDLE (MISCELLANEOUS) ×2 IMPLANT
COVER WAND RF STERILE (DRAPES) ×2 IMPLANT
DECANTER SPIKE VIAL GLASS SM (MISCELLANEOUS) ×4 IMPLANT
DERMABOND ADVANCED (GAUZE/BANDAGES/DRESSINGS) ×2
DERMABOND ADVANCED .7 DNX12 (GAUZE/BANDAGES/DRESSINGS) ×2 IMPLANT
DRAPE IMP U-DRAPE 54X76 (DRAPES) ×2 IMPLANT
DRAPE SHEET LG 3/4 BI-LAMINATE (DRAPES) ×4 IMPLANT
DRAPE STERI IOBAN 125X83 (DRAPES) ×2 IMPLANT
DRAPE U-SHAPE 47X51 STRL (DRAPES) ×4 IMPLANT
DRSG AQUACEL AG ADV 3.5X10 (GAUZE/BANDAGES/DRESSINGS) ×3 IMPLANT
ELECT REM PT RETURN 15FT ADLT (MISCELLANEOUS) ×2 IMPLANT
EVACUATOR 1/8 PVC DRAIN (DRAIN) IMPLANT
GLOVE SRG 8 PF TXTR STRL LF DI (GLOVE) ×1 IMPLANT
GLOVE SURG ENC MOIS LTX SZ8.5 (GLOVE) ×4 IMPLANT
GLOVE SURG ENC TEXT LTX SZ7.5 (GLOVE) ×4 IMPLANT
GLOVE SURG UNDER POLY LF SZ8 (GLOVE) ×2
GLOVE SURG UNDER POLY LF SZ8.5 (GLOVE) ×2 IMPLANT
GOWN STRL REUS W/ TWL LRG LVL3 (GOWN DISPOSABLE) ×1 IMPLANT
GOWN STRL REUS W/TWL 2XL LVL3 (GOWN DISPOSABLE) ×2 IMPLANT
GOWN STRL REUS W/TWL LRG LVL3 (GOWN DISPOSABLE) ×2
HANDPIECE INTERPULSE COAX TIP (DISPOSABLE) ×2
HEAD FEM UNIPOLAR 45 OD STRL (Hips) ×1 IMPLANT
HOLDER FOLEY CATH W/STRAP (MISCELLANEOUS) ×1 IMPLANT
HOOD PEEL AWAY FLYTE STAYCOOL (MISCELLANEOUS) ×8 IMPLANT
JET LAVAGE IRRISEPT WOUND (IRRIGATION / IRRIGATOR) ×2
KIT TURNOVER KIT A (KITS) ×2 IMPLANT
LAVAGE JET IRRISEPT WOUND (IRRIGATION / IRRIGATOR) IMPLANT
MANIFOLD NEPTUNE II (INSTRUMENTS) ×2 IMPLANT
MARKER SKIN DUAL TIP RULER LAB (MISCELLANEOUS) ×2 IMPLANT
NDL SPNL 18GX3.5 QUINCKE PK (NEEDLE) ×1 IMPLANT
NEEDLE SPNL 18GX3.5 QUINCKE PK (NEEDLE) ×2 IMPLANT
NS IRRIG 1000ML POUR BTL (IV SOLUTION) ×2 IMPLANT
PACK ANTERIOR HIP CUSTOM (KITS) ×2 IMPLANT
PENCIL SMOKE EVACUATOR (MISCELLANEOUS) ×1 IMPLANT
SAW OSC TIP CART 19.5X105X1.3 (SAW) ×2 IMPLANT
SEALER BIPOLAR AQUA 6.0 (INSTRUMENTS) ×2 IMPLANT
SET HNDPC FAN SPRY TIP SCT (DISPOSABLE) ×1 IMPLANT
SPACER DEPUY (Hips) ×1 IMPLANT
STAPLER VISISTAT 35W (STAPLE) ×1 IMPLANT
STEM TRI LOC GRIPTION SZ 4 STD (Hips) IMPLANT
SUT ETHIBOND NAB CT1 #1 30IN (SUTURE) ×4 IMPLANT
SUT MNCRL AB 3-0 PS2 18 (SUTURE) ×2 IMPLANT
SUT MON AB 2-0 CT1 36 (SUTURE) ×2 IMPLANT
SUT STRATAFIX PDO 1 14 VIOLET (SUTURE) ×2
SUT STRATFX PDO 1 14 VIOLET (SUTURE) ×1
SUT VIC AB 1 CT1 27 (SUTURE) ×2
SUT VIC AB 1 CT1 27XBRD ANTBC (SUTURE) ×1 IMPLANT
SUT VIC AB 2-0 CT1 27 (SUTURE) ×2
SUT VIC AB 2-0 CT1 TAPERPNT 27 (SUTURE) ×1 IMPLANT
SUTURE STRATFX PDO 1 14 VIOLET (SUTURE) ×1 IMPLANT
SYR 3ML LL SCALE MARK (SYRINGE) ×1 IMPLANT
TRAY FOLEY MTR SLVR 16FR STAT (SET/KITS/TRAYS/PACK) ×1 IMPLANT
TRI LOC GRIPTION SZ 4 STD (Hips) ×2 IMPLANT
WATER STERILE IRR 1000ML POUR (IV SOLUTION) ×4 IMPLANT

## 2020-05-29 NOTE — Progress Notes (Signed)
Pt oral meds were held, too lethargic to take PO meds. Amninistered Lopressor 5 mg IV for irregular heart rhythm , Hr 110-150 non-sustain while sleeping. Pain med given to help with discomfort. Called son to obtain informed consent, witnessed by CN. Will cont to monitor. SRP, RN

## 2020-05-29 NOTE — Progress Notes (Signed)
Initial Nutrition Assessment  DOCUMENTATION CODES:   Severe malnutrition in context of chronic illness  INTERVENTION:  - diet advancement as medically feasible. - will order Ensure Surgery BID, each supplement provides 330 kcal and 18 grams protein. - will order 30 ml Prosource Plus once/day, each supplement provides 100 kcal and 15 grams protein.  - will order 1 tablet multivitamin with minerals/day.  - weigh patient today.    NUTRITION DIAGNOSIS:   Severe Malnutrition related to chronic illness as evidenced by severe fat depletion,severe muscle depletion.  GOAL:   Patient will meet greater than or equal to 90% of their needs  MONITOR:   Diet advancement,PO intake,Supplement acceptance,Labs,Weight trends  REASON FOR ASSESSMENT:   Consult Hip fracture protocol  ASSESSMENT:   87 year-old female with medical history of afib on aspirin, L carotid artery occlusion, peripheral neuropathy, Zenker diverticulum, and dementia. She presented to the ED from SNF after staff heard her yelling in pain from the floor. She was noted to have a large hematoma on L side of her forehead. She was admitted with L femur fx.  Heart Healthy diet ordered yesterday at 1100 and changed to NPO at midnight. No intakes documented yesterday.   Patient sleeping with NRB in place and no family or visitors present. Patient did not awake even during NFPE.   She has not been weighed since 5/9 when she weighed 82 lb. Weight on 12/04/19 was 88 lb. This indicates 6 lb weight loss (6.8% body weight) in the past 6 months; not significant for time frame.   Per notes: - acute impacted L femoral neck fx pending surgical fixation - afib with RVR - hx of stage 3 CKD - hx of vascular dementia   Labs reviewed; BUN: 25 mg/dl. Medications reviewed; 20 mg oral pepcid/day.    NUTRITION - FOCUSED PHYSICAL EXAM:  Flowsheet Row Most Recent Value  Orbital Region Severe depletion  Upper Arm Region Severe depletion   Thoracic and Lumbar Region Unable to assess  Buccal Region Moderate depletion  Temple Region Severe depletion  Clavicle Bone Region Severe depletion  Clavicle and Acromion Bone Region Severe depletion  Scapular Bone Region Unable to assess  Dorsal Hand Severe depletion  Patellar Region Severe depletion  Anterior Thigh Region Unable to assess  Posterior Calf Region Moderate depletion  Edema (RD Assessment) None  Hair Reviewed  Eyes Unable to assess  Mouth Unable to assess  Skin Reviewed  Nails Reviewed       Diet Order:   Diet Order            Diet NPO time specified  Diet effective midnight                 EDUCATION NEEDS:   Not appropriate for education at this time  Skin:  Skin Assessment: Reviewed RN Assessment  Last BM:  PTA/unknown  Height:   Ht Readings from Last 1 Encounters:  05/19/20 5\' 2"  (1.575 m)    Weight:   Wt Readings from Last 1 Encounters:  05/19/20 37.2 kg    Estimated Nutritional Needs:  Kcal:  1400-1600 kcal Protein:  70-80 grams Fluid:  >/= 1.6 L/day     Jarome Matin, MS, RD, LDN, CNSC Inpatient Clinical Dietitian RD pager # available in [facility]  After hours/weekend pager # available in [facility]

## 2020-05-29 NOTE — Discharge Instructions (Signed)
 Dr. Chinelo Benn Joint Replacement Specialist [facility] [address] [place], [place] [address] [phone]   TOTAL HIP REPLACEMENT POSTOPERATIVE DIRECTIONS    Hip Rehabilitation, Guidelines Following Surgery   WEIGHT BEARING Weight bearing as tolerated with assist device (walker, cane, etc) as directed, use it as long as suggested by your surgeon or therapist, typically at least 4-6 weeks.  The results of a hip operation are greatly improved after range of motion and muscle strengthening exercises. Follow all safety measures which are given to protect your hip. If any of these exercises cause increased pain or swelling in your joint, decrease the amount until you are comfortable again. Then slowly increase the exercises. Call your caregiver if you have problems or questions.   HOME CARE INSTRUCTIONS  Most of the following instructions are designed to prevent the dislocation of your new hip.  . Remove items at home which could result in a fall. This includes throw rugs or furniture in walking pathways.  . Continue medications as instructed at time of discharge.  You may have some home medications which will be placed on hold until you complete the course of blood thinner medication.  You may start showering once you are discharged home. Do not remove your dressing. . Do not put on socks or shoes without following the instructions of your caregivers.   . Sit on chairs with arms. Use the chair arms to help push yourself up when arising.  . Arrange for the use of a toilet seat elevator so you are not sitting low.   Walk with walker as instructed.  . You may resume a sexual relationship in one month or when given the OK by your caregiver.  . Use walker as long as suggested by your caregivers.  . You may put full weight on your legs and walk as much as is comfortable. . Avoid periods of inactivity such as sitting longer than an hour when not asleep.  This helps prevent blood clots.  . You may return to work once you are cleared by your surgeon.  . Do not drive a car for 6 weeks or until released by your surgeon.  . Do not drive while taking narcotics.  . Wear elastic stockings for two weeks following surgery during the day but you may remove then at night.  . Make sure you keep all of your appointments after your operation with all of your doctors and caregivers. You should call the office at the above phone number and make an appointment for approximately two weeks after the date of your surgery. . Please pick up a stool softener and laxative for home use as long as you are requiring pain medications.  ICE to the affected hip every three hours for 30 minutes at a time and then as needed for pain and swelling. Continue to use ice on the hip for pain and swelling from surgery. You may notice swelling that will progress down to the foot and ankle.  This is normal after surgery.  Elevate the leg when you are not up walking on it.   . It is important for you to complete the blood thinner medication as prescribed by your doctor.  Continue to use the breathing machine which will help keep your temperature down.  It is common for your temperature to cycle up and down following surgery, especially at night when you are not up moving around and exerting yourself.  The breathing machine keeps your   lungs expanded and your temperature down.  RANGE OF MOTION AND STRENGTHENING EXERCISES  These exercises are designed to help you keep full movement of your hip joint. Follow your caregiver's or physical therapist's instructions. Perform all exercises about fifteen times, three times per day or as directed. Exercise both hips, even if you have had only one joint replacement. These exercises can be done on a training (exercise) mat, on the floor, on a table or on a bed. Use whatever works the best and is most comfortable for you. Use music or television while you are  exercising so that the exercises are a pleasant break in your day. This will make your life better with the exercises acting as a break in routine you can look forward to.  . Lying on your back, slowly slide your foot toward your buttocks, raising your knee up off the floor. Then slowly slide your foot back down until your leg is straight again.  . Lying on your back spread your legs as far apart as you can without causing discomfort.  . Lying on your side, raise your upper leg and foot straight up from the floor as far as is comfortable. Slowly lower the leg and repeat.  . Lying on your back, tighten up the muscle in the front of your thigh (quadriceps muscles). You can do this by keeping your leg straight and trying to raise your heel off the floor. This helps strengthen the largest muscle supporting your knee.  . Lying on your back, tighten up the muscles of your buttocks both with the legs straight and with the knee bent at a comfortable angle while keeping your heel on the floor.   SKILLED REHAB INSTRUCTIONS: If the patient is transferred to a skilled rehab facility following release from the hospital, a list of the current medications will be sent to the facility for the patient to continue.  When discharged from the skilled rehab facility, please have the facility set up the patient's Home Health Physical Therapy prior to being released. Also, the skilled facility will be responsible for providing the patient with their medications at time of release from the facility to include their pain medication and their blood thinner medication. If the patient is still at the rehab facility at time of the two week follow up appointment, the skilled rehab facility will also need to assist the patient in arranging follow up appointment in our office and any transportation needs.  POST-OPERATIVE OPIOID TAPER INSTRUCTIONS: . It is important to wean off of your opioid medication as soon as possible. If you do not  need pain medication after your surgery it is ok to stop day one. . Opioids include: o Codeine, Hydrocodone(Norco, Vicodin), Oxycodone(Percocet, oxycontin) and hydromorphone amongst others.  . Long term and even short term use of opiods can cause: o Increased pain response o Dependence o Constipation o Depression o Respiratory depression o And more.  . Withdrawal symptoms can include o Flu like symptoms o Nausea, vomiting o And more . Techniques to manage these symptoms o Hydrate well o Eat regular healthy meals o Stay active o Use relaxation techniques(deep breathing, meditating, yoga) . Do Not substitute Alcohol to help with tapering . If you have been on opioids for less than two weeks and do not have pain than it is ok to stop all together.  . Plan to wean off of opioids o This plan should start within one week post op of your joint replacement. o Maintain   the same interval or time between taking each dose and first decrease the dose.  o Cut the total daily intake of opioids by one tablet each day o Next start to increase the time between doses. o The last dose that should be eliminated is the evening dose.      MAKE SURE YOU:  . Understand these instructions.  . Will watch your condition.  . Will get help right away if you are not doing well or get worse.  Pick up stool softner and laxative for home use following surgery while on pain medications. Do not remove your dressing. The dressing is waterproof--it is OK to take showers. Continue to use ice for pain and swelling after surgery. Do not use any lotions or creams on the incision until instructed by your surgeon. Total Hip Protocol.   

## 2020-05-29 NOTE — Anesthesia Procedure Notes (Signed)
Procedure Name: Intubation Performed by: Hildreth Orsak A, CRNA Pre-anesthesia Checklist: Patient identified, Emergency Drugs available, Suction available and Patient being monitored Patient Re-evaluated:Patient Re-evaluated prior to induction Oxygen Delivery Method: Circle system utilized Preoxygenation: Pre-oxygenation with 100% oxygen Induction Type: IV induction Ventilation: Mask ventilation without difficulty Laryngoscope Size: Miller and 2 Grade View: Grade I Tube type: Oral Tube size: 7.0 mm Number of attempts: 1 Airway Equipment and Method: Stylet Placement Confirmation: ETT inserted through vocal cords under direct vision,  positive ETCO2 and breath sounds checked- equal and bilateral Secured at: 21 cm Tube secured with: Tape Dental Injury: Teeth and Oropharynx as per pre-operative assessment        

## 2020-05-29 NOTE — Progress Notes (Signed)
Received pt from PACU in stable condition. Pt Left hip hydrocolloid dressing dry and intact. Ice applied to surgical site. Tele placed and verified. Pt place on non-rebreather for support.  HR 97 to 100. Will update oncoming RN. SRP, RN

## 2020-05-29 NOTE — Progress Notes (Signed)
PHARMACY NOTE:  ANTIMICROBIAL RENAL DOSAGE ADJUSTMENT  Current antimicrobial regimen includes a mismatch between antimicrobial dosage and estimated renal function.  As per policy approved by the Pharmacy & Therapeutics and Medical Executive Committees, the antimicrobial dosage will be adjusted accordingly.  Current antimicrobial dosage: Cefazolin 2g IV q6h x 2 doses postoperatively   Indication: surgical prophylaxis   Renal Function:  Estimated Creatinine Clearance: 21.2 mL/min (by C-G formula based on SCr of 0.87 mg/dL). []      On intermittent HD, scheduled: []      On CRRT    Antimicrobial dosage has been changed to: Cefazolin 1g IV q12h x 2 doses postoperatively    Thank you for allowing pharmacy to be a part of this patient's care.  Luiz Ochoa, [facility] 05/29/2020 8:00 PM

## 2020-05-29 NOTE — Anesthesia Preprocedure Evaluation (Addendum)
Anesthesia Evaluation  Patient identified by MRN, date of birth, ID band Patient awake    Reviewed: Allergy & Precautions, NPO status , Patient's Chart, lab work & pertinent test results, reviewed documented beta blocker date and time   Airway Mallampati: III  TM Distance: >3 FB Neck ROM: Limited    Dental  (+) Missing, Partial Upper, Partial Lower, Caps, Poor Dentition, Chipped, Dental Advisory Given   Pulmonary neg pulmonary ROS,  Hoarseness    breath sounds clear to auscultation       Cardiovascular hypertension, Pt. on home beta blockers pulmonary hypertension+ Peripheral Vascular Disease and + DOE  + dysrhythmias Atrial Fibrillation  Rhythm:Irregular Rate:Tachycardia  Echo 2015 - Left ventricle: The cavity size was normal. There was mild concentric hypertrophy. Systolic function was normal. The estimated ejection fraction was in the range of 60% to 65%. Wall motion was normal; there were no regional wall motion abnormalities. The study is not technically sufficient to allowevaluation of LV diastolic function.  - Aortic valve: Trileaflet; mildly thickened, mildly calcified  leaflets. There was no regurgitation. - Aortic root: The aortic root was normal in size.  - Mitral valve: Structurally normal valve. There was no  regurgitation.  - Right ventricle: Systolic function was normal.  - Tricuspid valve: There was mild regurgitation.  - Pulmonary arteries: Systolic pressure was mildly increased. PA peak pressure: 39 mm Hg (S).  - Pericardium, extracardiac: There was no pericardial effusion.      Neuro/Psych  Headaches, PSYCHIATRIC DISORDERS Anxiety Dementia    GI/Hepatic GERD  Medicated and Controlled,Dysphagia    Endo/Other  diabetesHypothyroidism   Renal/GU Renal disease  negative genitourinary   Musculoskeletal  (+) Arthritis , Osteoarthritis,    Abdominal   Peds  Hematology negative hematology ROS (+)    Anesthesia Other Findings   Reproductive/Obstetrics                           Anesthesia Physical  Anesthesia Plan  ASA: IV  Anesthesia Plan: General   Post-op Pain Management:    Induction: Intravenous  PONV Risk Score and Plan: 4 or greater and Treatment may vary due to age or medical condition, Ondansetron and Dexamethasone  Airway Management Planned: Oral ETT  Additional Equipment: None  Intra-op Plan:   Post-operative Plan: Possible Post-op intubation/ventilation  Informed Consent: I have reviewed the patients History and Physical, chart, labs and discussed the procedure including the risks, benefits and alternatives for the proposed anesthesia with the patient or authorized representative who has indicated his/her understanding and acceptance.   Patient has DNR.  Discussed DNR with power of attorney and Suspend DNR.   Dental advisory given and Consent reviewed with POA  Plan Discussed with: CRNA  Anesthesia Plan Comments:      Anesthesia Quick Evaluation

## 2020-05-29 NOTE — Op Note (Signed)
OPERATIVE REPORT  SURGEON: Rod Can, MD   ASSISTANT: Cherlynn June, PA-C  PREOPERATIVE DIAGNOSIS: Displaced Left femoral neck fracture.   POSTOPERATIVE DIAGNOSIS: Displaced Left femoral neck fracture.   PROCEDURE: Left hip hemiarthroplasty, anterior approach.   IMPLANTS: DePuy Tri Lock stem, size 4, std offset, with a -3 mm spacer and a 45 mm monopolar head ball.  ANESTHESIA:  General  ANTIBIOTICS: 2g ancef.  ESTIMATED BLOOD LOSS:-100 mL    DRAINS: None.  COMPLICATIONS: None   CONDITION: PACU - hemodynamically stable.   BRIEF CLINICAL NOTE: Vanessa [name] is a 87 y.o. female with a displaced Left femoral neck fracture. The patient was admitted to the hospitalist service and underwent perioperative risk stratification and medical optimization. The risks, benefits, and alternatives to hemiarthroplasty were explained, and the patient elected to proceed.  PROCEDURE IN DETAIL: The patient was taken to the operating room and general anesthesia was induced on the hospital bed.  The patient was then positioned on the Hana table.  All bony prominences were well padded.  The hip was prepped and draped in the normal sterile surgical fashion.  A time-out was called verifying side and site of surgery. Antibiotics were given within 60 minutes of beginning the procedure.   Bikini incision was made and the direct anterior approach to the hip was performed through the Hueter interval.  Lateral femoral circumflex vessels were treated with the Auqumantys. The anterior capsule was exposed and an inverted T capsulotomy was made.  Fracture hematoma was encountered and evacuated. The patient was found to have a comminuted Left subcapital femoral neck fracture.  I freshened the femoral neck cut with a saw.  I removed the femoral neck fragment.  A corkscrew was placed into the head and the head was removed.  This was passed to the back table and was measured.   Acetabular exposure was  achieved.  I examined the articular cartilage which was intact.  The labrum was intact. A 45 mm trial head was placed and found to have excellent fit.   I then gained femoral exposure taking care to protect the abductors and greater trochanter.  This was performed using standard external rotation, extension, and adduction.  The capsule was peeled off the superior aspect of the greater trochanter, taking care to preserve the short external rotators. A cookie cutter was used to enter the femoral canal, and then the femoral canal finder was used to confirm location.  I then sequentially broached up to a size 4.  Calcar planer was used on the femoral neck remnant.  I paced a std neck and a 36+ 0 head ball. The hip was reduced.  Leg lengths were checked fluoroscopically.  The hip was dislocated and trial components were removed.  I placed the real stem followed by the real spacer and head ball.  A single reduction maneuver was performed and the hip was reduced.  Fluoroscopy was used to confirm component position and leg lengths.  At 90 degrees of external rotation and extension, the hip was stable to an anterior directed force.   The wound was copiously irrigated with Irrisept solution and normal saline using pule lavage.  Marcaine solution was injected into the periarticular soft tissue.  The wound was closed in layers using #1 Vicryl and V-Loc for the fascia, 2-0 Vicryl for the subcutaneous fat, 2-0 Monocryl for the deep dermal layer, and staples plus glue for the skin.  Once the glue was fully dried, an Aquacell Ag dressing was applied.  The patient was then awakened from anesthesia and transported to the recovery room in stable condition.  Sponge, needle, and instrument counts were correct at the end of the case x2.  The patient tolerated the procedure well and there were no known complications.  Please note that a surgical assistant was a medical necessity for this procedure to perform it in a safe and  expeditious manner. Assistant was necessary to provide appropriate retraction of vital neurovascular structures, to prevent femoral fracture, and to allow for anatomic placement of the prosthesis.

## 2020-05-29 NOTE — Progress Notes (Addendum)
PROGRESS NOTE    Vanessa [name]  UMP:[id number] DOB: 06-18-1920 DOA: 05/28/2020 PCP: [name], MD    Brief Narrative:  Vanessa [name] was admitted to the hospital with working diagnosis of acute impacted left femoral neck fracture.  87 year old female past medical history for atrial fibrillation, carotid artery occlusion, peripheral neuropathy, single diverticulum and dementia who presented after mechanical fall.  Apparently patient fell in her nursing home, unable to stand back on her feet.  She was found to have a large hematoma left side of her forehead that prompted her to come to the hospital.  History was limited due to her cognitive impairment.  On her initial physical examination blood pressure 116/77, heart rate 115-125, respiratory rate 18, oxygen saturation 97% her lungs were clear to auscultation bilaterally, heart S1-S2, present, tachycardic, irregularly irregular, abdomen soft nontender, left leg was externally rotated and shortened.  Sodium 136, potassium 4.8, chloride 100, bicarb 28, glucose 130, BUN 22, creatinine 0.90, white count 19.9, hemoglobin 13.6, hematocrit 41.2, platelets 215. SARS COVID-19 negative.  Head CT with a large left frontal scalp hematoma, no intracranial normalities.  No acute fracture dislocation cervical spine.  EKG 110 bpm, normal axis, normal QTC, atrial fibrillation rhythm, no significant ST segment or T wave changes.  Assessment & Plan:   Principal Problem:   Femur fracture ([place]) Active Problems:   Carotid stenosis   Atrial fibrillation with RVR (HCC)   Secondary diabetes mellitus with stage 3 chronic kidney disease (GFR 30-59) (HCC)   Vascular dementia (HCC)   CKD (chronic kidney disease) stage 3, GFR 30-59 ml/min (HCC)   1. Acute impacted left femur fracture. Plan for surgical intervention today, continue pain control and DVT prophylaxis. Follow post operative recommendations.   2. Atrial fibrillation with RVR patient with  atrial fibrillation with rapid ventricular response rate of 400 to 867, systolic blood pressure greater than 120 mmHg. Patient in pain and positive agitation.  Plan to start diltiazem infusion for further rate control.  Start anticoagulation when safe per orthopedics.   3. CKD stage 3a Stable renal function with serum cr at 0,87, K is 4,6 and serum bicarbonate at 29. Follow up renal function in am.    4. Dementia/ severe malnutrition. Continue neuro checks per unit protocol.  Post operative agitation, continue pain control and will add as needed haldol.   Continue with nutritional supplements.   5. T2DM fasting glucose this am 141, will continue sliding scale for glucose cover and monitoring.    6. Reactive leukocytosis. Persistent leukocytosis, no clinical signs of infection, will continue to hold on antibiotic therapy Follow up on cell count in am.      Patient continue to be at high risk for medical complications post hip surgery, including worsening atrial fibrillation,.   Status is: Inpatient  Remains inpatient appropriate because:IV treatments appropriate due to intensity of illness or inability to take PO   Dispo: The patient is from: Home              Anticipated d/c is to: Home              Patient currently is not medically stable to d/c.   Difficult to place patient Yes   DVT prophylaxis: Enoxaparin   Code Status:   DNR   Family Communication:  No family at the bedside      Nutrition Status: Nutrition Problem: Severe Malnutrition Etiology: chronic illness Signs/Symptoms: severe fat depletion,severe muscle depletion Interventions: Ensure Surgery,Prostat,MVI  Consultants:   Orthopedics   Procedures:   Hip surgery    Subjective: Patient post op in recovery room, pain is controlled with analgesics, no nausea or vomiting, no dyspnea or chest pain.   Objective: Vitals:   05/29/20 0122 05/29/20 0531 05/29/20 1032 05/29/20 1305  BP: 118/70 (!)  166/96 (!) 145/92 115/82  Pulse: 93 90 (!) 111 (!) 101  Resp:   17 18  Temp: 98.4 F (36.9 C) 98.7 F (37.1 C) 98.6 F (37 C) 98.6 F (37 C)  TempSrc: Axillary Axillary Oral Oral  SpO2: 100% 100% 99% 100%    Intake/Output Summary (Last 24 hours) at 05/29/2020 1613 Last data filed at 05/29/2020 1302 Gross per 24 hour  Intake 0 ml  Output 775 ml  Net -775 ml   There were no vitals filed for this visit.  Examination:   General: Not in pain or dyspnea, deconditioned  Neurology: Awake and alert, non focal  E ENT: no pallor, no icterus, oral mucosa moist Cardiovascular: No JVD. S1-S2 present, rhythmic, no gallops, rubs, or murmurs. No lower extremity edema. Pulmonary: positive breath sounds bilaterally, adequate air movement, no wheezing, rhonchi or rales. Gastrointestinal. Abdomen soft and non tender Skin. No rashes Musculoskeletal: no joint deformities     Data Reviewed: I have personally reviewed following labs and imaging studies  CBC: Recent Labs  Lab 05/28/20 0831 05/29/20 0323  WBC 19.9* 18.0*  NEUTROABS 17.4*  --   HGB 13.6 13.6  HCT 41.2 41.7  MCV 105.1* 108.6*  PLT 215 973   Basic Metabolic Panel: Recent Labs  Lab 05/28/20 0831 05/29/20 0323  NA 136 140  K 4.8 4.6  CL 100 104  CO2 28 29  GLUCOSE 130* 141*  BUN 22 25*  CREATININE 0.90 0.87  CALCIUM 9.2 9.4   GFR: Estimated Creatinine Clearance: 21.2 mL/min (by C-G formula based on SCr of 0.87 mg/dL). Liver Function Tests: Recent Labs  Lab 05/28/20 0831  AST 43*  ALT 18  ALKPHOS 64  BILITOT 1.0  PROT 6.9  ALBUMIN 3.7   No results for input(s): LIPASE, AMYLASE in the last 168 hours. No results for input(s): AMMONIA in the last 168 hours. Coagulation Profile: Recent Labs  Lab 05/28/20 0831  INR 1.0   Cardiac Enzymes: No results for input(s): CKTOTAL, CKMB, CKMBINDEX, TROPONINI in the last 168 hours. BNP (last 3 results) No results for input(s): PROBNP in the last 8760  hours. HbA1C: No results for input(s): HGBA1C in the last 72 hours. CBG: No results for input(s): GLUCAP in the last 168 hours. Lipid Profile: No results for input(s): CHOL, HDL, LDLCALC, TRIG, CHOLHDL, LDLDIRECT in the last 72 hours. Thyroid Function Tests: No results for input(s): TSH, T4TOTAL, FREET4, T3FREE, THYROIDAB in the last 72 hours. Anemia Panel: No results for input(s): VITAMINB12, FOLATE, FERRITIN, TIBC, IRON, RETICCTPCT in the last 72 hours.    Radiology Studies: I have reviewed all of the imaging during this hospital visit personally     Scheduled Meds: . [MAR Hold] (feeding supplement) PROSource Plus  30 mL Oral Daily  . [MAR Hold] acetaminophen  650 mg Oral Once  . [MAR Hold] aspirin  81 mg Oral Daily  . [MAR Hold] atenolol  12.5 mg Oral BID  . chlorhexidine  60 mL Topical Once  . [MAR Hold] famotidine  20 mg Oral Daily  . [MAR Hold] feeding supplement  237 mL Oral BID BM  . [MAR Hold] multivitamin with minerals  1 tablet Oral Daily  .  povidone-iodine  2 application Topical Once  . povidone-iodine  2 application Topical Once   Continuous Infusions: . ceFAZolin    .  ceFAZolin (ANCEF) IV    . lactated ringers 75 mL/hr at 05/29/20 1500  . phenylephrine    . tranexamic acid    . tranexamic acid       LOS: 1 day        Sanford Lindblad Gerome Apley, MD

## 2020-05-29 NOTE — Transfer of Care (Signed)
Immediate Anesthesia Transfer of Care Note  Patient: Eulah Citizen  Procedure(s) Performed: ANTERIOR APPROACH HEMI HIP ARTHROPLASTY (Left )  Patient Location: PACU  Anesthesia Type:General  Level of Consciousness: awake, alert  and oriented  Airway & Oxygen Therapy: Patient Spontanous Breathing and Patient connected to face mask  Post-op Assessment: Report given to RN and Post -op Vital signs reviewed and stable  Post vital signs: Reviewed and stable  Last Vitals:  Vitals Value Taken Time  BP    Temp    Pulse    Resp 14 05/29/20 1759  SpO2    Vitals shown include unvalidated device data.  Last Pain:  Vitals:   05/29/20 1305  TempSrc: Oral  PainSc:       Patients Stated Pain Goal: 0 (70/17/79 3903)  Complications: No complications documented.

## 2020-05-29 NOTE — Interval H&P Note (Signed)
History and Physical Interval Note:  05/29/2020 3:10 PM  Vanessa [name]  has presented today for surgery, with the diagnosis of left hip fracture.  The various methods of treatment have been discussed with the patient and family. After consideration of risks, benefits and other options for treatment, the patient has consented to  Procedure(s): ANTERIOR APPROACH HEMI HIP ARTHROPLASTY (Left) as a surgical intervention.  The patient's history has been reviewed, patient examined, no change in status, stable for surgery.  I have reviewed the patient's chart and labs.  Questions were answered to the patient's satisfaction.    The risks, benefits, and alternatives were discussed with the patient / son. There are risks associated with the surgery including, but not limited to, problems with anesthesia (death), infection, instability (giving out of the joint), dislocation, differences in leg length/angulation/rotation, fracture of bones, loosening or failure of implants, hematoma (blood accumulation) which may require surgical drainage, blood clots, pulmonary embolism, nerve injury (foot drop and lateral thigh numbness), and blood vessel injury. The patient / son understands these risks and elects to proceed.    Hilton Cork Jenaya Saar

## 2020-05-29 NOTE — Progress Notes (Signed)
Pt on call to surgery...report given. SRP, RN

## 2020-05-29 NOTE — Plan of Care (Signed)
  Problem: Education: Goal: Knowledge of General Education information will improve Description Including pain rating scale, medication(s)/side effects and non-pharmacologic comfort measures Outcome: Progressing   Problem: Health Behavior/Discharge Planning: Goal: Ability to manage health-related needs will improve Outcome: Progressing   

## 2020-05-30 DIAGNOSIS — S7292XA Unspecified fracture of left femur, initial encounter for closed fracture: Secondary | ICD-10-CM | POA: Diagnosis not present

## 2020-05-30 DIAGNOSIS — I6522 Occlusion and stenosis of left carotid artery: Secondary | ICD-10-CM | POA: Diagnosis not present

## 2020-05-30 DIAGNOSIS — N183 Chronic kidney disease, stage 3 unspecified: Secondary | ICD-10-CM | POA: Diagnosis not present

## 2020-05-30 DIAGNOSIS — I4891 Unspecified atrial fibrillation: Secondary | ICD-10-CM | POA: Diagnosis not present

## 2020-05-30 LAB — CBC
HCT: 37.3 % (ref 36.0–46.0)
Hemoglobin: 12.3 g/dL (ref 12.0–15.0)
MCH: 35.4 pg — ABNORMAL HIGH (ref 26.0–34.0)
MCHC: 33 g/dL (ref 30.0–36.0)
MCV: 107.5 fL — ABNORMAL HIGH (ref 80.0–100.0)
Platelets: 186 10*3/uL (ref 150–400)
RBC: 3.47 MIL/uL — ABNORMAL LOW (ref 3.87–5.11)
RDW: 13.9 % (ref 11.5–15.5)
WBC: 22.7 10*3/uL — ABNORMAL HIGH (ref 4.0–10.5)
nRBC: 0 % (ref 0.0–0.2)

## 2020-05-30 LAB — BASIC METABOLIC PANEL
Anion gap: 7 (ref 5–15)
BUN: 33 mg/dL — ABNORMAL HIGH (ref 8–23)
CO2: 27 mmol/L (ref 22–32)
Calcium: 8.6 mg/dL — ABNORMAL LOW (ref 8.9–10.3)
Chloride: 105 mmol/L (ref 98–111)
Creatinine, Ser: 1.04 mg/dL — ABNORMAL HIGH (ref 0.44–1.00)
GFR, Estimated: 49 mL/min — ABNORMAL LOW (ref >60)
Glucose, Bld: 168 mg/dL — ABNORMAL HIGH (ref 70–99)
Potassium: 4.5 mmol/L (ref 3.5–5.1)
Sodium: 139 mmol/L (ref 135–145)

## 2020-05-30 MED ORDER — DILTIAZEM 12 MG/ML ORAL SUSPENSION
60.0000 mg | Freq: Two times a day (BID) | ORAL | Status: DC
  Filled 2020-05-30: qty 6

## 2020-05-30 MED ORDER — SODIUM CHLORIDE 0.9% FLUSH
10.0000 mL | INTRAVENOUS | Status: DC | PRN
Start: 2020-05-30 — End: 2020-05-30

## 2020-05-30 MED ORDER — VITAMIN B-2 25 MG PO TABS: 25.0000 mg | ORAL_TABLET | Freq: Every day | ORAL | Status: DC

## 2020-05-30 MED ORDER — LORAZEPAM 0.5 MG PO TABS: 0.5000 mg | ORAL_TABLET | Freq: Two times a day (BID) | ORAL | Status: DC | PRN

## 2020-05-30 MED ORDER — LORATADINE 10 MG PO TABS: 10.0000 mg | ORAL_TABLET | Freq: Every day | ORAL | Status: DC | PRN

## 2020-05-30 MED ORDER — PREGABALIN 25 MG PO CAPS
25.0000 mg | ORAL_CAPSULE | Freq: Two times a day (BID) | ORAL | Status: DC
  Administered 2020-05-30 – 2020-06-01 (×5): 25 mg via ORAL
  Filled 2020-05-30 (×5): qty 1

## 2020-05-30 MED ORDER — ADULT MULTIVITAMIN LIQUID CH
15.0000 mL | Freq: Every day | ORAL | Status: DC
  Filled 2020-05-30 (×2): qty 15

## 2020-05-30 MED ORDER — FLUTICASONE PROPIONATE 50 MCG/ACT NA SUSP
2.0000 | Freq: Every day | NASAL | Status: DC
  Administered 2020-05-30 – 2020-06-01 (×3): 2 via NASAL
  Filled 2020-05-30: qty 16

## 2020-05-30 MED ORDER — DILTIAZEM HCL 60 MG PO TABS
60.0000 mg | ORAL_TABLET | Freq: Two times a day (BID) | ORAL | Status: DC
  Administered 2020-05-31 (×2): 60 mg via ORAL
  Filled 2020-05-30 (×3): qty 1

## 2020-05-30 MED ORDER — FOOD THICKENER (SIMPLYTHICK)
1.0000 | Freq: Every day | ORAL | Status: DC
  Administered 2020-05-30 – 2020-06-01 (×3): 1 via ORAL

## 2020-05-30 MED ORDER — DILTIAZEM HCL ER 60 MG PO CP12
60.0000 mg | ORAL_CAPSULE | Freq: Two times a day (BID) | ORAL | Status: DC
  Administered 2020-05-30: 60 mg via ORAL
  Filled 2020-05-30 (×2): qty 1

## 2020-05-30 MED ORDER — SODIUM CHLORIDE 0.9% FLUSH
10.0000 mL | Freq: Two times a day (BID) | INTRAVENOUS | Status: DC
  Administered 2020-05-30: 10 mL

## 2020-05-30 MED ORDER — BOOST PLUS PO LIQD
237.0000 mL | Freq: Two times a day (BID) | ORAL | Status: DC
  Administered 2020-05-30 – 2020-06-01 (×3): 237 mL via ORAL
  Filled 2020-05-30 (×4): qty 237

## 2020-05-30 NOTE — NC FL2 (Signed)
[place] MEDICAID FL2 LEVEL OF CARE SCREENING TOOL     IDENTIFICATION  Patient Name: Vanessa [name] Birthdate: 11/05/1920 Sex: female Admission Date (Current Location): 05/28/2020  [facility] and [place] Number:  Herbalist and Address:  [facility],  [place], [place]      Provider Number: [id number]  Attending Physician Name and Address:  Tawni Millers,*  Relative Name and Phone Number:  [name], Rovito [phone]  [phone]  Darryl Lent [phone]  [phone]    Current Level of Care: Hospital Recommended Level of Care: [place] Prior Approval Number:    Date Approved/Denied:   PASRR Number: [id number]  Discharge Plan: SNF    Current Diagnoses: Patient Active Problem List   Diagnosis Date Noted  . Femur fracture ([place]) 05/28/2020  . Leukocytosis 04/08/2020  . Loss, vision, sudden, bilateral 02/12/2020  . Vascular dementia ([place]) 02/12/2020  . CKD (chronic kidney disease) stage 3, GFR 30-59 ml/min (HCC) 02/12/2020  . Secondary diabetes mellitus with stage 3 chronic kidney disease (GFR 30-59) ([place]) 01/22/2020  . Urinary incontinence 12/03/2019  . Macular degeneration, age related 05/09/2019  . Choking 03/30/2019  . Cricopharyngeus muscle dysfunction   . Allergic rhinitis 02/01/2019  . Stomatitis 12/15/2018  . Mild cognitive impairment 08/01/2017  . Leg mass, left 04/01/2017  . Moderate protein-calorie malnutrition ([place]) 12/27/2016  . Closed fracture of nasal bones   . Severe protein-calorie malnutrition ([place])   . Atrial fibrillation with RVR ([place]) 12/02/2016  . Esophageal stenosis   . History of rib fracture 08/04/2016  . Age-related osteoporosis without current pathological fracture 08/04/2016  . Unsteady gait 04/19/2016  . Urinary frequency 12/09/2015  . OCD (obsessive compulsive disorder) 09/02/2015  . Hypothyroidism 04/15/2015  . GERD (gastroesophageal reflux  disease) 02/11/2015  . Constipation 02/11/2015  . Cricopharyngeal hypertrophy 08/27/2014  . Dysphonia 08/27/2014  . 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 Chronic lower back pain 08/13/2014  . Carotid stenosis 08/13/2014  . Xerostomia 06/18/2014  . Tingling sensation-Left Leg 06/05/2014  . Hyperglycemia 04/02/2014  . Weight loss 04/02/2014  . Idiopathic scoliosis 04/02/2014  . Hearing loss 04/02/2014  . Dysphagia 04/02/2014  . Seborrheic keratoses, inflamed 04/02/2014  . Weakness 09/12/2013  . Anxiety state 09/12/2013  . GI bleed 09/10/2013  . Other and unspecified ovarian cyst 09/10/2013  . Fall 09/03/2013  . PVD (peripheral vascular disease) ([place]) 02/21/2013  . Occlusion of left internal carotid artery 08/16/2012  . Scalp hematoma, subsequent encounter 02/08/2012  . Varicose veins 12/22/2011  . Raynaud phenomenon 06/26/2010  . Osteoarthritis 06/26/2010  . Neuropathy   . Hyperlipidemia     Orientation RESPIRATION BLADDER Height & Weight     Self  O2 (2L) Incontinent Weight:   Height:     BEHAVIORAL SYMPTOMS/MOOD NEUROLOGICAL BOWEL NUTRITION STATUS      Incontinent Diet (Dysphagia 1)  AMBULATORY STATUS COMMUNICATION OF NEEDS Skin   Extensive Assist Verbally Surgical wounds                       Personal Care Assistance Level of Assistance  Bathing,Feeding,Dressing Bathing Assistance: Maximum assistance Feeding assistance: Maximum assistance Dressing Assistance: Maximum assistance     Functional Limitations Info  Sight Sight Info: Adequate Hearing Info: Adequate      SPECIAL CARE FACTORS FREQUENCY  PT (By licensed PT),OT (By licensed OT)     PT Frequency: Minimum 5x a week OT Frequency: Minimum 5x a week  Contractures Contractures Info: Not present    Additional Factors Info  Code Status,Allergies Code Status Info: DNR Allergies Info: NKA           Current Medications (05/30/2020):  This is the current hospital active medication  list Current Facility-Administered Medications  Medication Dose Route Frequency Provider Last Rate Last Admin  . (feeding supplement) PROSource Plus liquid 30 mL  30 mL Oral Daily Rod Can, MD   30 mL at 05/30/20 1255  . aspirin chewable tablet 81 mg  81 mg Oral Daily Rod Can, MD   81 mg at 05/30/20 0932  . atenolol (TENORMIN) tablet 12.5 mg  12.5 mg Oral BID Rod Can, MD   12.5 mg at 05/30/20 0933  . Chlorhexidine Gluconate Cloth 2 % PADS 6 each  6 each Topical Daily Arrien, Jimmy Picket, MD   6 each at 05/30/20 1153  . diltiazem (CARDIZEM SR) 12 hr capsule 60 mg  60 mg Oral Q12H Arrien, Jimmy Picket, MD   60 mg at 05/30/20 1430  . diltiazem (CARDIZEM) 125 mg in dextrose 5% 125 mL (1 mg/mL) infusion  5-15 mg/hr Intravenous Continuous Arrien, Jimmy Picket, MD   Stopped at 05/30/20 1538  . docusate sodium (COLACE) capsule 100 mg  100 mg Oral BID Rod Can, MD   100 mg at 05/30/20 0932  . enoxaparin (LOVENOX) injection 30 mg  30 mg Subcutaneous Q24H Rod Can, MD   30 mg at 05/30/20 0932  . famotidine (PEPCID) tablet 20 mg  20 mg Oral Daily Rod Can, MD   20 mg at 05/30/20 0932  . feeding supplement (ENSURE SURGERY) liquid 237 mL  237 mL Oral BID BM Rod Can, MD   237 mL at 05/30/20 1120  . fluticasone (FLONASE) 50 MCG/ACT nasal spray 2 spray  2 spray Each Nare Daily Arrien, Jimmy Picket, MD   2 spray at 05/30/20 1600  . food thickener (SIMPLYTHICK (NECTAR/LEVEL 2/MILDLY THICK)) 1 packet  1 packet Oral 5 X Daily Arrien, Jimmy Picket, MD   1 packet at 05/30/20 1541  . haloperidol (HALDOL) tablet 1 mg  1 mg Oral Q6H PRN Arrien, Jimmy Picket, MD   1 mg at 05/30/20 1642   Or  . haloperidol lactate (HALDOL) injection 1 mg  1 mg Intramuscular Q6H PRN Arrien, Jimmy Picket, MD      . HYDROcodone-acetaminophen (NORCO/VICODIN) 5-325 MG per tablet 1 tablet  1 tablet Oral Q6H PRN Swinteck, Aaron Edelman, MD      . HYDROmorphone (DILAUDID) injection  0.5 mg  0.5 mg Intravenous Q4H PRN Rod Can, MD   0.5 mg at 05/30/20 0931  . lactose free nutrition (BOOST PLUS) liquid 237 mL  237 mL Oral BID WC Arrien, Jimmy Picket, MD   237 mL at 05/30/20 1601  . loratadine (CLARITIN) tablet 10 mg  10 mg Oral Daily PRN Arrien, Jimmy Picket, MD      . LORazepam (ATIVAN) tablet 0.5 mg  0.5 mg Oral BID PRN Arrien, Jimmy Picket, MD      . menthol-cetylpyridinium (CEPACOL) lozenge 3 mg  1 lozenge Oral PRN Rod Can, MD       Or  . phenol (CHLORASEPTIC) mouth spray 1 spray  1 spray Mouth/Throat PRN Swinteck, Aaron Edelman, MD      . metoCLOPramide (REGLAN) tablet 5-10 mg  5-10 mg Oral Q8H PRN Swinteck, Aaron Edelman, MD       Or  . metoCLOPramide (REGLAN) injection 5-10 mg  5-10 mg Intravenous Q8H PRN Rod Can, MD      .  metoprolol tartrate (LOPRESSOR) injection 5 mg  5 mg Intravenous Q8H PRN Rod Can, MD   5 mg at 05/29/20 1035  . [START ON 05/31/2020] multivitamin liquid 15 mL  15 mL Oral Daily Arrien, Jimmy Picket, MD      . ondansetron [facility]) tablet 4 mg  4 mg Oral Q6H PRN Rod Can, MD       Or  . ondansetron [facility]) injection 4 mg  4 mg Intravenous Q6H PRN Swinteck, Aaron Edelman, MD      . pregabalin (LYRICA) capsule 25 mg  25 mg Oral BID Tawni Millers, MD   25 mg at 05/30/20 1600  . sodium chloride flush (NS) 0.9 % injection 10-40 mL  10-40 mL Intracatheter Q12H Arrien, Jimmy Picket, MD   10 mL at 05/30/20 1341  . sodium chloride flush (NS) 0.9 % injection 10-40 mL  10-40 mL Intracatheter PRN Arrien, Jimmy Picket, MD         Discharge Medications: Please see discharge summary for a list of discharge medications.  Relevant Imaging Results:  Relevant Lab Results:   Additional Information SSN [id number]  Ross Ludwig, LCSW

## 2020-05-30 NOTE — Progress Notes (Signed)
PROGRESS NOTE    IYANNI HEPP  ZOX:[id number] DOB: 1920-03-19 DOA: 05/28/2020 PCP: [name], MD    Brief Narrative:  Mrs. Entwistle was admitted to the hospital with working diagnosis of acute impacted left femoral neck fracture.  87 year old female past medical history for atrial fibrillation, carotid artery occlusion, peripheral neuropathy, Zenker's diverticulum and dementia who presented after mechanical fall.  Apparently patient fell in her nursing home, unable to stand back on her feet.  She was found to have a large hematoma left side of her forehead that prompted her to come to the hospital.  History was limited due to her cognitive impairment.  On her initial physical examination blood pressure 116/77, heart rate 115-125, respiratory rate 18, oxygen saturation 97% her lungs were clear to auscultation bilaterally, heart S1-S2, present, tachycardic, irregularly irregular, abdomen soft nontender, left leg was externally rotated and shortened.  Sodium 136, potassium 4.8, chloride 100, bicarb 28, glucose 130, BUN 22, creatinine 0.90, white count 19.9, hemoglobin 13.6, hematocrit 41.2, platelets 215. SARS COVID-19 negative.  Head CT with a large left frontal scalp hematoma, no intracranial normalities.  No acute fracture dislocation cervical spine.  EKG 110 bpm, normal axis, normal QTC, atrial fibrillation rhythm, no significant ST segment or T wave changes.  Patient underwent left hip hemiarthroplasty on 05/19. She developed atrial fibrillation with rapid ventricular response post-operatively and was placed on IV diltiazem with good toleration.    Assessment & Plan:   Principal Problem:   Femur fracture ([place]) Active Problems:   Carotid stenosis   Atrial fibrillation with RVR (HCC)   Secondary diabetes mellitus with stage 3 chronic kidney disease (GFR 30-59) (HCC)   Vascular dementia (HCC)   CKD (chronic kidney disease) stage 3, GFR 30-59 ml/min (HCC)    1. Acute  impacted left femur fracture. Continue pain control and DVT prophylaxis. Continue to follow up with OT and PT recommendations. Post op care per orthopedics.   2. Atrial fibrillation with RVR . HR 90 to 100 atrial fibrillation rhythm, personally reviewed telemetry.  Current rate of diltiazem drip is 5 mg/hr.  Will transition to diltiazem 60 mg ER bid, and continue with low dose atenolol. Continue telemetry monitoring, pain and agitation control with analgesics and haldol as needed.   Patient with high fall risk will hold on anticoagulation.   3. CKD stage 3a  Renal function with serum cr at 1.0 with K at 4,5 and serum bicarbonate at 27. Follow up on renal function in am, avoid hypotension and nephrotoxic medications.   4. Dementia/ severe calorie protein malnutrition. Patient with confusion and occasional agitation, likely part of her dementia. Patient on bilateral mittens to protect IV lines.   Continue as needed haldol for now.  Resume as needed lorazepam and scheduled pregabalin.     Continue with nutritional supplements and vitamins. Consult speech for evaluation.   5. T2DM glucose continue to be stable with fasting glucose 168, capillary 123. Continue to encourage po. Hold on insulin for now. Capillary glucose monitoring as needed.    6. Reactive leukocytosis. Persistent leukocytosis with cell count up to 22.7 Will continue close monitoring of cell count. No indication for antibiotic therapy.    Patient continue to be at high risk for worsening atrial fibrillation  Status is: Inpatient  Remains inpatient appropriate because:IV treatments appropriate due to intensity of illness or inability to take PO   Dispo: The patient is from: SNF  Anticipated d/c is to: SNF              Patient currently is not medically stable to d/c.   Difficult to place patient No   DVT prophylaxis: Enoxaparin   Code Status:   DNR   Family Communication:  I spoke over the phone  with the patient's son about patient's  condition, plan of care, prognosis and all questions were addressed.    Nutrition Status: Nutrition Problem: Severe Malnutrition Etiology: chronic illness Signs/Symptoms: severe fat depletion,severe muscle depletion Interventions: Ensure Surgery,Prostat,MVI     Consultants:   Orthopedics   Procedures:   Left hip arthroplasty     Subjective: Patient has been confused, no nausea or vomiting, no dyspnea or chest pain, occasional agitation. Has mittens in place bilaterally  Objective: Vitals:   05/29/20 2300 05/29/20 2336 05/30/20 0126 05/30/20 0612  BP: (!) 84/39 106/86 121/61 (!) 145/69  Pulse:      Resp:      Temp:   98.9 F (37.2 C) 99.2 F (37.3 C)  TempSrc:   Axillary Axillary  SpO2:        Intake/Output Summary (Last 24 hours) at 05/30/2020 1134 Last data filed at 05/30/2020 0600 Gross per 24 hour  Intake 1114.55 ml  Output 475 ml  Net 639.55 ml   There were no vitals filed for this visit.  Examination:   General: Not in pain or dyspnea, deconditioned  Neurology: Awake and alert, non focal. Confused and disorientated  E ENT: mild pallor, no icterus, oral mucosa moist Cardiovascular: No JVD. S1-S2 present, irregularly irregular with no gallops, rubs, or murmurs. No lower extremity edema. Pulmonary: positive breath sounds bilaterally, with wheezing, rhonchi or rales. Gastrointestinal. Abdomen soft and non tender Skin. Left forehead hematoma, left neck and forearm eschymosis   Musculoskeletal: no joint deformities         Data Reviewed: I have personally reviewed following labs and imaging studies  CBC: Recent Labs  Lab 05/28/20 0831 05/29/20 0323 05/30/20 0407  WBC 19.9* 18.0* 22.7*  NEUTROABS 17.4*  --   --   HGB 13.6 13.6 12.3  HCT 41.2 41.7 37.3  MCV 105.1* 108.6* 107.5*  PLT 215 195 829   Basic Metabolic Panel: Recent Labs  Lab 05/28/20 0831 05/29/20 0323 05/30/20 0407  NA 136 140 139  K  4.8 4.6 4.5  CL 100 104 105  CO2 28 29 27   GLUCOSE 130* 141* 168*  BUN 22 25* 33*  CREATININE 0.90 0.87 1.04*  CALCIUM 9.2 9.4 8.6*   GFR: Estimated Creatinine Clearance: 17.7 mL/min (A) (by C-G formula based on SCr of 1.04 mg/dL (H)). Liver Function Tests: Recent Labs  Lab 05/28/20 0831  AST 43*  ALT 18  ALKPHOS 64  BILITOT 1.0  PROT 6.9  ALBUMIN 3.7   No results for input(s): LIPASE, AMYLASE in the last 168 hours. No results for input(s): AMMONIA in the last 168 hours. Coagulation Profile: Recent Labs  Lab 05/28/20 0831  INR 1.0   Cardiac Enzymes: No results for input(s): CKTOTAL, CKMB, CKMBINDEX, TROPONINI in the last 168 hours. BNP (last 3 results) No results for input(s): PROBNP in the last 8760 hours. HbA1C: No results for input(s): HGBA1C in the last 72 hours. CBG: No results for input(s): GLUCAP in the last 168 hours. Lipid Profile: No results for input(s): CHOL, HDL, LDLCALC, TRIG, CHOLHDL, LDLDIRECT in the last 72 hours. Thyroid Function Tests: No results for input(s): TSH, T4TOTAL, FREET4, T3FREE, THYROIDAB in the last  72 hours. Anemia Panel: No results for input(s): VITAMINB12, FOLATE, FERRITIN, TIBC, IRON, RETICCTPCT in the last 72 hours.    Radiology Studies: I have reviewed all of the imaging during this hospital visit personally     Scheduled Meds: . (feeding supplement) PROSource Plus  30 mL Oral Daily  . aspirin  81 mg Oral Daily  . atenolol  12.5 mg Oral BID  .  ceFAZolin (ANCEF) IV  1 g Intravenous Q12H  . Chlorhexidine Gluconate Cloth  6 each Topical Daily  . docusate sodium  100 mg Oral BID  . enoxaparin (LOVENOX) injection  30 mg Subcutaneous Q24H  . famotidine  20 mg Oral Daily  . feeding supplement  237 mL Oral BID BM  . multivitamin with minerals  1 tablet Oral Daily   Continuous Infusions: . diltiazem (CARDIZEM) infusion 5 mg/hr (05/29/20 2007)     LOS: 2 days        Antwoin Lackey Gerome Apley, MD

## 2020-05-30 NOTE — Progress Notes (Signed)
    Subjective:  No events noted.  Objective:   VITALS:   Vitals:   05/29/20 2300 05/29/20 2336 05/30/20 0126 05/30/20 0612  BP: (!) 84/39 106/86 121/61 (!) 145/69  Pulse:      Resp:      Temp:   98.9 F (37.2 C) 99.2 F (37.3 C)  TempSrc:   Axillary Axillary  SpO2:        NAD, confused ABD soft Intact pulses distally Dorsiflexion/Plantar flexion intact Incision: dressing C/D/I Compartment soft Unable to obtain sensory exam due to MS  Lab Results  Component Value Date   WBC 22.7 (H) 05/30/2020   HGB 12.3 05/30/2020   HCT 37.3 05/30/2020   MCV 107.5 (H) 05/30/2020   PLT 186 05/30/2020   BMET    Component Value Date/Time   NA 139 05/30/2020 0407   NA 141 01/21/2020 0000   NA 140 03/07/2017 0000   K 4.5 05/30/2020 0407   K 4.9 03/07/2017 0000   CL 105 05/30/2020 0407   CL 103 08/04/2017 0000   CO2 27 05/30/2020 0407   CO2 29 08/04/2017 0000   GLUCOSE 168 (H) 05/30/2020 0407   BUN 33 (H) 05/30/2020 0407   BUN 25 (A) 01/21/2020 0000   CREATININE 1.04 (H) 05/30/2020 0407   CREATININE 0.77 03/07/2017 0000   CALCIUM 8.6 (L) 05/30/2020 0407   CALCIUM 9.3 08/04/2017 0000   GFRNONAA 49 (L) 05/30/2020 0407   GFRNONAA 66 03/07/2017 0000   GFRAA 53 01/21/2020 0000     Assessment/Plan: 1 Day Post-Op   Principal Problem:   Femur fracture (HCC) Active Problems:   Carotid stenosis   Atrial fibrillation with RVR (HCC)   Secondary diabetes mellitus with stage 3 chronic kidney disease (GFR 30-59) (HCC)   Vascular dementia (HCC)   CKD (chronic kidney disease) stage 3, GFR 30-59 ml/min (HCC)   WBAT with walker DVT ppx: Lovenox, SCDs, TEDS PO pain control PT/OT Dispo: D/C planning   Hilton Cork Boss Danielsen 05/30/2020, 4:47 PM   Rod Can, MD [phone] [place] [facility]  [place] [address], [address], [place], [place] [address] Phone: [phone] [email or web address] Facebook  [employer]

## 2020-05-30 NOTE — Progress Notes (Signed)
Foley d/c'd PO 1 as ordered, Purewick applied. Will monitor for output or bladder scan as needed. SRP, RN

## 2020-05-30 NOTE — Plan of Care (Signed)

## 2020-05-30 NOTE — Evaluation (Signed)
Clinical/Bedside Swallow Evaluation Patient Details  Name: Vanessa [name] MRN: [id number] Date of Birth: 1920/12/19  Today's Date: 05/30/2020 Time: SLP Start Time (ACUTE ONLY): 79 SLP Stop Time (ACUTE ONLY): 1425 SLP Time Calculation (min) (ACUTE ONLY): 20 min  Past Medical History:  Past Medical History:  Diagnosis Date  . Abdominal bloating   . Atrial fibrillation ([place])   . Bruises easily   . Carotid artery occlusion    LEFT  . Cricopharyngeal dysphagia   . Dizziness   . DOE (dyspnea on exertion) 04/02/2014  . Fall at home Sept 2013, Dec. 2013  Jun 08, 2012  . GERD (gastroesophageal reflux disease) 02/11/2015  . Headache(784.0)   . Hoarseness   . Hypercholesterolemia   . Hyperglycemia 04/02/2014   Glucose 178 mg percent on 01/22/2014 04/05/14 Hgb A1c 6.6 Diet controlled.    . Hypertensive retinopathy    OU  . Hypothyroidism 04/15/2015  . Macular degeneration    Dry OU  . Neuropathy    PERIPHERAL  . Pruritus   . Scoliosis   . Varicose veins   . Zenker's diverticulum    Past Surgical History:  Past Surgical History:  Procedure Laterality Date  . ABDOMINAL HYSTERECTOMY  1954  . CATARACT EXTRACTION Bilateral   . CHOLECYSTECTOMY  1997  . ESOPHAGOGASTRODUODENOSCOPY N/A 03/30/2019   Procedure: ESOPHAGOGASTRODUODENOSCOPY (EGD);  Surgeon: Jerene Bears, MD;  Location: [facility];  Service: Gastroenterology;  Laterality: N/A;  . ESOPHAGOGASTRODUODENOSCOPY (EGD) WITH PROPOFOL N/A 11/25/2016   Procedure: ESOPHAGOGASTRODUODENOSCOPY (EGD) WITH PROPOFOL;  Surgeon: [name], MD;  Location: [facility];  Service: Endoscopy;  Laterality: N/A;  . EYE SURGERY Bilateral    Cat Sx  . SPINE SURGERY  1997   correct scoliosis   HPI:  87yo female admitted 05/28/20 after a fall with left hip fracture. PMH: AFib,Left carotid artery occlusion, peripheral neuropathy, Zenker's diverticulum, dementia, cricopharyngeal dysphagia, dyspnea on exertion, GERD - hx esophageal dysphagia.  Esophagram 2018 = persistent abnormal relaxation of CP with proximal esophageal narrowing. No aspiration.   Assessment / Plan / Recommendation Clinical Impression  Pt seen at bedside for assessment of swallow function and safety. Pt was awake, but very confused. She was calling out "help me" and "I'm going to lose the baby". Pt was constantly trying to remove mitts. She was unable to follow commands for CN exam, however, function and strength appear WFL. Pt accepted trials of thin liquid, nectar thick liquid, and pureed textures. Cough response noted after thin liquids. Oral holding of puree, with pt benefitting from cues to swallow. Nectar thick liquids tolerated without overt s/s aspiration. Pt has a long standing history of esophageal dysmotility, including persistent abnormal relaxation of CP and proximal esophageal narrowing. At this time, pureed diet and nectar thick liquids are recommended, with crushed meds and 1:1 assist with all PO intake. Also recommend consideration of Palliative Care consult to facilitate establishment of appropriate goals of care. RN and MD informed. Safe swallow precautions posted at [facility]. SLP will follow for diet tolerance.   SLP Visit Diagnosis: Dysphagia, unspecified (R13.10)    Aspiration Risk  Risk for inadequate nutrition/hydration;Mild aspiration risk;Moderate aspiration risk    Diet Recommendation Dysphagia 1 (Puree);Thin liquid   Liquid Administration via: Straw Medication Administration: Crushed with puree Supervision: Full supervision/cueing for compensatory strategies;Staff to assist with self feeding Compensations: Slow rate;Small sips/bites Postural Changes: Seated upright at 90 degrees;Remain upright for at least 30 minutes after po intake    Other  Recommendations Oral Care Recommendations:  Oral care BID Other Recommendations: Order thickener from pharmacy   Follow up Recommendations Skilled Nursing facility;24 hour supervision/assistance       Frequency and Duration min 2x/week  1 week;2 weeks       Prognosis Prognosis for Safe Diet Advancement: Fair Barriers to Reach Goals: Cognitive deficits      Swallow Study   General Date of Onset: 05/28/20 HPI: 87yo female admitted 05/28/20 after a fall with left hip fracture. PMH: AFib,Left carotid artery occlusion, peripheral neuropathy, Zenker's diverticulum, dementia, cricopharyngeal dysphagia, dyspnea on exertion, GERD - hx esophageal dysphagia. Esophagram 2018 = persistent abnormal relaxation of CP with proximal esophageal narrowing. No aspiration. Type of Study: Bedside Swallow Evaluation Previous Swallow Assessment: MBS 2015 - noted presence of esophageal issues. Diet Prior to this Study: Regular;Thin liquids Temperature Spikes Noted: No Respiratory Status: Nasal cannula History of Recent Intubation: Yes Length of Intubations (days): 1 days (for surgery) Date extubated: 05/29/20 Behavior/Cognition: Alert;Confused;Distractible;Doesn't follow directions Oral Cavity Assessment: Within Functional Limits Oral Care Completed by SLP: No Oral Cavity - Dentition: Adequate natural dentition Self-Feeding Abilities: Total assist Patient Positioning: Upright in bed Baseline Vocal Quality: Normal Volitional Cough: Cognitively unable to elicit Volitional Swallow: Unable to elicit    Oral/Motor/Sensory Function Overall Oral Motor/Sensory Function: Within functional limits   Ice Chips Ice chips: Not tested   Thin Liquid Thin Liquid: Impaired Presentation: Straw Pharyngeal  Phase Impairments: Cough - Immediate    Nectar Thick Nectar Thick Liquid: Within functional limits Presentation: Straw   Honey Thick Honey Thick Liquid: Not tested   Puree Puree: Impaired Oral Phase Impairments: Poor awareness of bolus Oral Phase Functional Implications: Oral holding   Solid     Solid: Not tested     Jonte Wollam B. Quentin Ore, [place], [place] Speech Language Pathologist Office: [phone]  Shonna Chock 05/30/2020,2:37 PM

## 2020-05-30 NOTE — Care Management Important Message (Signed)
Important Message  Patient Details IM Letter given to the Patient. Name: Vanessa [name] MRN: [id number] Date of Birth: 08-Jun-1920   Medicare Important Message Given:  Yes     Kerin Salen 05/30/2020, 10:29 AM

## 2020-05-30 NOTE — Plan of Care (Signed)
  Problem: Education: Goal: Knowledge of General Education information will improve Description: Including pain rating scale, medication(s)/side effects and non-pharmacologic comfort measures 05/30/2020 1338 by Zadie Rhine, RN Outcome: Progressing 05/30/2020 1212 by Zadie Rhine, RN Outcome: Not Progressing   Problem: Health Behavior/Discharge Planning: Goal: Ability to manage health-related needs will improve 05/30/2020 1338 by Zadie Rhine, RN Outcome: Progressing 05/30/2020 1212 by Zadie Rhine, RN Outcome: Not Progressing   Problem: Clinical Measurements: Goal: Ability to maintain clinical measurements within normal limits will improve 05/30/2020 1338 by Zadie Rhine, RN Outcome: Progressing 05/30/2020 1212 by Zadie Rhine, RN Outcome: Not Progressing Goal: Will remain free from infection 05/30/2020 1338 by Zadie Rhine, RN Outcome: Progressing 05/30/2020 1212 by Zadie Rhine, RN Outcome: Not Progressing Goal: Diagnostic test results will improve 05/30/2020 1338 by Zadie Rhine, RN Outcome: Progressing 05/30/2020 1212 by Zadie Rhine, RN Outcome: Not Progressing Goal: Respiratory complications will improve 05/30/2020 1338 by Zadie Rhine, RN Outcome: Progressing 05/30/2020 1212 by Zadie Rhine, RN Outcome: Not Progressing Goal: Cardiovascular complication will be avoided 05/30/2020 1338 by Zadie Rhine, RN Outcome: Progressing 05/30/2020 1212 by Zadie Rhine, RN Outcome: Not Progressing   Problem: Activity: Goal: Risk for activity intolerance will decrease 05/30/2020 1338 by Zadie Rhine, RN Outcome: Progressing 05/30/2020 1212 by Zadie Rhine, RN Outcome: Not Progressing   Problem: Nutrition: Goal: Adequate nutrition will be maintained 05/30/2020 1338 by Zadie Rhine, RN Outcome: Progressing 05/30/2020 1212 by Zadie Rhine, RN Outcome: Not Progressing   Problem: Coping: Goal: Level  of anxiety will decrease 05/30/2020 1338 by Zadie Rhine, RN Outcome: Progressing 05/30/2020 1212 by Zadie Rhine, RN Outcome: Not Progressing   Problem: Elimination: Goal: Will not experience complications related to bowel motility 05/30/2020 1338 by Zadie Rhine, RN Outcome: Progressing 05/30/2020 1212 by Zadie Rhine, RN Outcome: Not Progressing Goal: Will not experience complications related to urinary retention 05/30/2020 1338 by Zadie Rhine, RN Outcome: Progressing 05/30/2020 1212 by Zadie Rhine, RN Outcome: Not Progressing   Problem: Pain Managment: Goal: General experience of comfort will improve 05/30/2020 1338 by Zadie Rhine, RN Outcome: Progressing 05/30/2020 1212 by Zadie Rhine, RN Outcome: Not Progressing   Problem: Safety: Goal: Ability to remain free from injury will improve 05/30/2020 1338 by Zadie Rhine, RN Outcome: Progressing 05/30/2020 1212 by Zadie Rhine, RN Outcome: Not Progressing

## 2020-05-30 NOTE — Evaluation (Signed)
Physical Therapy Evaluation Patient Details Name: Vanessa [name] MRN: [id number] DOB: [birth year] Today's Date: 05/30/2020   History of Present Illness  87 year old female past medical history for atrial fibrillation, carotid artery occlusion, peripheral neuropathy, single diverticulum and dementia who presented after mechanical fall and sustained large left frontal scalp hematoma and Displaced Left femoral neck fracture.  Pt s/p left direct anterior THA on 05/29/20  Clinical Impression  Patient is s/p above surgery resulting in functional limitations due to the deficits listed below (see PT Problem List).  Patient will benefit from skilled PT to increase their independence and safety with mobility to allow discharge to the venue listed below.  Pt requiring total assist for bed mobility at this time.  Will attempt to assist pt with mobilizing however uncertain of carryover due to dementia.      Follow Up Recommendations SNF    Equipment Recommendations  None recommended by PT    Recommendations for Other Services       Precautions / Restrictions Precautions Precautions: Fall Restrictions Weight Bearing Restrictions: No Other Position/Activity Restrictions: WBAT      Mobility  Bed Mobility Overal bed mobility: Needs Assistance Bed Mobility: Supine to Sit;Sit to Supine     Supine to sit: Total assist Sit to supine: Total assist   General bed mobility comments: pt requiring total assist    Transfers                 General transfer comment: pt not able to remain in sitting position without complete assist  Ambulation/Gait                Stairs            Wheelchair Mobility    Modified Rankin (Stroke Patients Only)       Balance Overall balance assessment: Needs assistance Sitting-balance support: Bilateral upper extremity supported;Feet supported Sitting balance-Leahy Scale: Zero   Postural control: Posterior lean                                    Pertinent Vitals/Pain Pain Assessment: Faces Faces Pain Scale: Hurts whole lot Pain Location: left hip Pain Descriptors / Indicators: Grimacing;Guarding Pain Intervention(s): Monitored during session;Premedicated before session;Repositioned    Home Living Family/patient expects to be discharged to:: Skilled nursing facility                      Prior Function Level of Independence: Needs assistance         Comments: pt poor historian     Hand Dominance        Extremity/Trunk Assessment        Lower Extremity Assessment Lower Extremity Assessment: Generalized weakness;LLE deficits/detail LLE Deficits / Details: pt moving LEs in bed per RN however when requested she reports pain and requires assist       Communication   Communication: HOH  Cognition Arousal/Alertness: Awake/alert Behavior During Therapy: Restless Overall Cognitive Status: History of cognitive impairments - at baseline                                 General Comments: pt stating she wanted to get up, asking for her sons throughout session, not able to reorient      General Comments      Exercises     Assessment/Plan  PT Assessment Patient needs continued PT services  PT Problem List Decreased strength;Decreased mobility;Decreased activity tolerance;Decreased balance;Decreased knowledge of use of DME;Decreased cognition;Pain       PT Treatment Interventions DME instruction;Gait training;Balance training;Therapeutic exercise;Functional mobility training;Therapeutic activities;Patient/family education;Wheelchair mobility training    PT Goals (Current goals can be found in the Care Plan section)  Acute Rehab PT Goals PT Goal Formulation: Patient unable to participate in goal setting Time For Goal Achievement: 06/13/20 Potential to Achieve Goals: Fair    Frequency Min 2X/week   Barriers to discharge        Co-evaluation                AM-PAC PT "6 Clicks" Mobility  Outcome Measure Help needed turning from your back to your side while in a flat bed without using bedrails?: Total Help needed moving from lying on your back to sitting on the side of a flat bed without using bedrails?: Total Help needed moving to and from a bed to a chair (including a wheelchair)?: Total Help needed standing up from a chair using your arms (e.g., wheelchair or bedside chair)?: Total Help needed to walk in hospital room?: Total Help needed climbing 3-5 steps with a railing? : Total 6 Click Score: 6    End of Session   Activity Tolerance: Patient tolerated treatment well Patient left: in bed;with call bell/phone within reach;with bed alarm set   PT Visit Diagnosis: Difficulty in walking, not elsewhere classified (R26.2);Pain Pain - Right/Left: Right Pain - part of body: Hip    Time: 9381-0175 PT Time Calculation (min) (ACUTE ONLY): 10 min   Charges:   PT Evaluation $PT Eval Low Complexity: 1 Low     Kati PT, DPT Acute Rehabilitation Services Pager: [phone] Office: [phone]   York Ram E 05/30/2020, 1:06 PM

## 2020-05-31 DIAGNOSIS — N183 Chronic kidney disease, stage 3 unspecified: Secondary | ICD-10-CM | POA: Diagnosis not present

## 2020-05-31 DIAGNOSIS — S7292XA Unspecified fracture of left femur, initial encounter for closed fracture: Secondary | ICD-10-CM | POA: Diagnosis not present

## 2020-05-31 DIAGNOSIS — I6522 Occlusion and stenosis of left carotid artery: Secondary | ICD-10-CM | POA: Diagnosis not present

## 2020-05-31 DIAGNOSIS — I4891 Unspecified atrial fibrillation: Secondary | ICD-10-CM | POA: Diagnosis not present

## 2020-05-31 LAB — CBC
HCT: 33.3 % — ABNORMAL LOW (ref 36.0–46.0)
Hemoglobin: 10.7 g/dL — ABNORMAL LOW (ref 12.0–15.0)
MCH: 35 pg — ABNORMAL HIGH (ref 26.0–34.0)
MCHC: 32.1 g/dL (ref 30.0–36.0)
MCV: 108.8 fL — ABNORMAL HIGH (ref 80.0–100.0)
Platelets: 179 10*3/uL (ref 150–400)
RBC: 3.06 MIL/uL — ABNORMAL LOW (ref 3.87–5.11)
RDW: 14.1 % (ref 11.5–15.5)
WBC: 14 10*3/uL — ABNORMAL HIGH (ref 4.0–10.5)
nRBC: 0 % (ref 0.0–0.2)

## 2020-05-31 LAB — BASIC METABOLIC PANEL
Anion gap: 8 (ref 5–15)
BUN: 40 mg/dL — ABNORMAL HIGH (ref 8–23)
CO2: 28 mmol/L (ref 22–32)
Calcium: 8.8 mg/dL — ABNORMAL LOW (ref 8.9–10.3)
Chloride: 103 mmol/L (ref 98–111)
Creatinine, Ser: 0.91 mg/dL (ref 0.44–1.00)
GFR, Estimated: 57 mL/min — ABNORMAL LOW (ref >60)
Glucose, Bld: 143 mg/dL — ABNORMAL HIGH (ref 70–99)
Potassium: 4.4 mmol/L (ref 3.5–5.1)
Sodium: 139 mmol/L (ref 135–145)

## 2020-05-31 MED ORDER — POLYETHYLENE GLYCOL 3350 17 G PO PACK
17.0000 g | PACK | Freq: Every day | ORAL | Status: DC
  Administered 2020-05-31 – 2020-06-01 (×2): 17 g via ORAL
  Filled 2020-05-31 (×2): qty 1

## 2020-05-31 NOTE — Progress Notes (Signed)
PROGRESS NOTE    Vanessa [name]  WUJ:[id number] DOB: 14-Jan-1920 DOA: 05/28/2020 PCP: [name], MD    Brief Narrative:  Mrs. Canady was admitted to the hospital with working diagnosis of acute impacted left femoral neck fracture, complicated with atrial fibrillation with RVR.   87 year old female past medical history for atrial fibrillation, carotid artery occlusion, peripheral neuropathy, Zenker's diverticulum and dementia who presented after mechanical fall. Apparently patient fell in her nursing home, unable to stand back on her feet. She was found to have a large hematoma left side of her forehead that prompted her to come to the hospital. History was limited due to her cognitive impairment. On her initial physical examination blood pressure 116/77, heart rate 115-125, respiratory rate 18, oxygen saturation 97% her lungs were clear to auscultation bilaterally, heart S1-S2, present, tachycardic, irregularly irregular, abdomen soft nontender, left leg was externally rotated and shortened.  Sodium 136, potassium 4.8, chloride 100, bicarb 28, glucose 130, BUN 22, creatinine 0.90, white count 19.9, hemoglobin 13.6, hematocrit 41.2, platelets 215. SARS COVID-19 negative.  Head CT with a large left frontal scalp hematoma, no intracranial normalities. No acute fracture dislocation cervical spine.  EKG 110 bpm, normal axis, normal QTC, atrial fibrillation rhythm, no significant ST segment or T wave changes.  Patient underwent left hip hemiarthroplasty on 05/19. Post operative she developed atrial fibrillation with rapid ventricular response and was placed on IV diltiazem with good toleration.   Now transitioned to oral diltiazem with good toleration.   Plan to return to SNF to continue physical therapy.   Assessment & Plan:   Principal Problem:   Femur fracture ([place]) Active Problems:   Carotid stenosis   Atrial fibrillation with RVR ([facility])   Secondary diabetes mellitus  with stage 3 chronic kidney disease (GFR 30-59) ([facility])   Vascular dementia ([facility])   CKD (chronic kidney disease) stage 3, GFR 30-59 ml/min ([facility])    1. Acute impacted left femur fracture.  Continue pain control with as needed hydromorphone and hydrocodone.  On pragabaline and add bowel regimen.  Plan to return to SNF with rehab. Continue with DVT prophylaxis.   2. Atrial fibrillation with RVR. Improved heart rate control, personally reviewed telemetry, HR 90 to 110 atrial fibrillation rhythm,   Continue with diltiazem 60 mg ER bid and atenolol.   Holding on anticoagulation due to fall risk.  Continue telemetry monitoring.    3. CKD stage 3a  stable renal function with serum cr at 0,91 with K at 4,4 and serum bicarbonate at 28. Off IV fluids patient tolerating po well.    4. Dementia/ severe calorie protein malnutrition.health care giver at the bedside. Patient likely at her baseline. Today has only one mitten in place.  Continue with as needed lorazepam and scheduled pregabalin.   Required 2 mg of haldol yesterday.   Nutritional supplements and multivitamins. Speech recommended dysphagia one diet with aspiration precautions.   5. T2DMfasting glucose is 143, will continue sliding scale for glucose cover and monitoring. Continue to encourage po intake.   6. Reactive leukocytosis/ post operative anemia.cell count is down to 14,0 from 22.7. No signs of infection, no indication for antibiotic therapy.  Hgb down to 10,7 from 12,3, continue close follow up, currently no indication for PRBC transfusion, no evidence of acute blood loss.   Status is: Inpatient  Remains inpatient appropriate because:IV treatments appropriate due to intensity of illness or inability to take PO   Dispo: The patient is from: SNF  Anticipated d/c is to: SNF              Patient currently is not medically stable to d/c. Possible dc to SNF in am, depending on afib control.     Difficult to place patient No       DVT prophylaxis: Enoxaparin   Code Status:   DNR   Family Communication:  I spoke with patient's care giver at the bedside, we talked in detail about patient's condition, plan of care and prognosis and all questions were addressed.       Nutrition Status: Nutrition Problem: Severe Malnutrition Etiology: chronic illness Signs/Symptoms: severe fat depletion,severe muscle depletion Interventions: Ensure Surgery,Prostat,MVI     Consultants:   Orthopedics   Procedures:   Left hip arthroplasty     Subjective: Patient with no nausea or vomiting, no frank hip pain but mild back pain, positive confusion, likely at her baseline.   Objective: Vitals:   05/30/20 2048 05/30/20 2124 05/30/20 2303 05/31/20 0530  BP: 116/76 106/65 (!) 98/44 110/81  Pulse: 77 77  88  Resp:      Temp:  98.1 F (36.7 C)  99.2 F (37.3 C)  TempSrc:  Axillary  Axillary  SpO2:  91%  98%    Intake/Output Summary (Last 24 hours) at 05/31/2020 0941 Last data filed at 05/31/2020 0535 Gross per 24 hour  Intake 20 ml  Output 100 ml  Net -80 ml   There were no vitals filed for this visit.  Examination:   General: Not in pain or dyspnea, deconditioned  Neurology: Awake and alert, non focal  E ENT: no pallor, no icterus, oral mucosa moist Cardiovascular: No JVD. S1-S2 present, irregularly irregular. No lower extremity edema. Pulmonary: positive breath sounds bilaterally, adequate air movement, no wheezing, rhonchi or rales. Gastrointestinal. Abdomen soft and non tender Skin. No rashes Musculoskeletal: no joint deformities     Data Reviewed: I have personally reviewed following labs and imaging studies  CBC: Recent Labs  Lab 05/28/20 0831 05/29/20 0323 05/30/20 0407 05/31/20 0346  WBC 19.9* 18.0* 22.7* 14.0*  NEUTROABS 17.4*  --   --   --   HGB 13.6 13.6 12.3 10.7*  HCT 41.2 41.7 37.3 33.3*  MCV 105.1* 108.6* 107.5* 108.8*  PLT 215 195 186 119    Basic Metabolic Panel: Recent Labs  Lab 05/28/20 0831 05/29/20 0323 05/30/20 0407 05/31/20 0346  NA 136 140 139 139  K 4.8 4.6 4.5 4.4  CL 100 104 105 103  CO2 28 29 27 28   GLUCOSE 130* 141* 168* 143*  BUN 22 25* 33* 40*  CREATININE 0.90 0.87 1.04* 0.91  CALCIUM 9.2 9.4 8.6* 8.8*   GFR: Estimated Creatinine Clearance: 20.3 mL/min (by C-G formula based on SCr of 0.91 mg/dL). Liver Function Tests: Recent Labs  Lab 05/28/20 0831  AST 43*  ALT 18  ALKPHOS 64  BILITOT 1.0  PROT 6.9  ALBUMIN 3.7   No results for input(s): LIPASE, AMYLASE in the last 168 hours. No results for input(s): AMMONIA in the last 168 hours. Coagulation Profile: Recent Labs  Lab 05/28/20 0831  INR 1.0   Cardiac Enzymes: No results for input(s): CKTOTAL, CKMB, CKMBINDEX, TROPONINI in the last 168 hours. BNP (last 3 results) No results for input(s): PROBNP in the last 8760 hours. HbA1C: No results for input(s): HGBA1C in the last 72 hours. CBG: No results for input(s): GLUCAP in the last 168 hours. Lipid Profile: No results for input(s): CHOL, HDL, LDLCALC, TRIG,  CHOLHDL, LDLDIRECT in the last 72 hours. Thyroid Function Tests: No results for input(s): TSH, T4TOTAL, FREET4, T3FREE, THYROIDAB in the last 72 hours. Anemia Panel: No results for input(s): VITAMINB12, FOLATE, FERRITIN, TIBC, IRON, RETICCTPCT in the last 72 hours.    Radiology Studies: I have reviewed all of the imaging during this hospital visit personally     Scheduled Meds: . (feeding supplement) PROSource Plus  30 mL Oral Daily  . aspirin  81 mg Oral Daily  . atenolol  12.5 mg Oral BID  . Chlorhexidine Gluconate Cloth  6 each Topical Daily  . diltiazem  60 mg Oral Q12H  . docusate sodium  100 mg Oral BID  . enoxaparin (LOVENOX) injection  30 mg Subcutaneous Q24H  . famotidine  20 mg Oral Daily  . feeding supplement  237 mL Oral BID BM  . fluticasone  2 spray Each Nare Daily  . food thickener  1 packet Oral 5 X  Daily  . lactose free nutrition  237 mL Oral BID WC  . multivitamin  15 mL Oral Daily  . pregabalin  25 mg Oral BID   Continuous Infusions:   LOS: 3 days        Augusta Hilbert Gerome Apley, MD

## 2020-05-31 NOTE — Plan of Care (Signed)
  Problem: Clinical Measurements: Goal: Respiratory complications will improve Outcome: Progressing   Problem: Coping: Goal: Level of anxiety will decrease Outcome: Progressing   Problem: Pain Managment: Goal: General experience of comfort will improve Outcome: Progressing   Problem: Activity: Goal: Risk for activity intolerance will decrease Outcome: Not Progressing   Problem: Nutrition: Goal: Adequate nutrition will be maintained Outcome: Not Progressing

## 2020-05-31 NOTE — Progress Notes (Signed)
   Subjective: 2 Days Post-Op Procedure(s) (LRB): ANTERIOR APPROACH HEMI HIP ARTHROPLASTY (Left)  Recheck left hip Pt alert and awake but disoriented Gloves in place  Patient reports pain as mild.  Objective:   VITALS:   Vitals:   05/30/20 2303 05/31/20 0530  BP: (!) 98/44 110/81  Pulse:  88  Resp:    Temp:  99.2 F (37.3 C)  SpO2:  98%    Left hip incision healing well Dressing intact  No drainage or erythema nv intact distally  LABS Recent Labs    05/29/20 0323 05/30/20 0407 05/31/20 0346  HGB 13.6 12.3 10.7*  HCT 41.7 37.3 33.3*  WBC 18.0* 22.7* 14.0*  PLT 195 186 179    Recent Labs    05/29/20 0323 05/30/20 0407 05/31/20 0346  NA 140 139 139  K 4.6 4.5 4.4  BUN 25* 33* 40*  CREATININE 0.87 1.04* 0.91  GLUCOSE 141* 168* 143*     Assessment/Plan: 2 Days Post-Op Procedure(s) (LRB): ANTERIOR APPROACH HEMI HIP ARTHROPLASTY (Left) PT/OT as able D/c planning per medical team Will continue to monitor her progress    Kathrynn Speed, [place] [facility] now [employer] [facility] [address], [place], [place], [place] [address] Phone: [phone] [email or web address] Facebook  [employer]

## 2020-05-31 NOTE — Plan of Care (Signed)

## 2020-05-31 NOTE — TOC Progression Note (Signed)
Transition of Care [facility]) - Progression Note    Patient Details  Name: Vanessa [name] MRN: [id number] Date of Birth: Jun 19, 1920  [facility] CM/SW Contact  Kameisha Malicki, Juliann Pulse, RN Phone Number: 05/31/2020, 12:10 PM  Clinical Narrative:  Per attending likely d/c in am-not medically stable today-Spoke to [facility] rep [place] nurse-made aware-faxed signed fl2,& H&P today-fax#[phone] with confirmation. MD ordered covid test. Will go to rm#5,nsg call report tel#[phone] once ready for d/c after covid neg,& d/c summary,DNR in shadow chart-must be signed-MD aware.          Expected Discharge Plan and Services                                                 Social Determinants of Health (SDOH) Interventions    Readmission Risk Interventions No flowsheet data found.

## 2020-06-01 DIAGNOSIS — I6522 Occlusion and stenosis of left carotid artery: Secondary | ICD-10-CM | POA: Diagnosis not present

## 2020-06-01 DIAGNOSIS — N183 Chronic kidney disease, stage 3 unspecified: Secondary | ICD-10-CM | POA: Diagnosis not present

## 2020-06-01 DIAGNOSIS — I4891 Unspecified atrial fibrillation: Secondary | ICD-10-CM | POA: Diagnosis not present

## 2020-06-01 DIAGNOSIS — S7292XA Unspecified fracture of left femur, initial encounter for closed fracture: Secondary | ICD-10-CM | POA: Diagnosis not present

## 2020-06-01 LAB — CBC
HCT: 32.4 % — ABNORMAL LOW (ref 36.0–46.0)
Hemoglobin: 10.4 g/dL — ABNORMAL LOW (ref 12.0–15.0)
MCH: 34.9 pg — ABNORMAL HIGH (ref 26.0–34.0)
MCHC: 32.1 g/dL (ref 30.0–36.0)
MCV: 108.7 fL — ABNORMAL HIGH (ref 80.0–100.0)
Platelets: 197 10*3/uL (ref 150–400)
RBC: 2.98 MIL/uL — ABNORMAL LOW (ref 3.87–5.11)
RDW: 14.1 % (ref 11.5–15.5)
WBC: 12.5 10*3/uL — ABNORMAL HIGH (ref 4.0–10.5)
nRBC: 0 % (ref 0.0–0.2)

## 2020-06-01 LAB — RESP PANEL BY RT-PCR (FLU A&B, COVID) ARPGX2
Influenza A by PCR: NEGATIVE
Influenza B by PCR: NEGATIVE
SARS Coronavirus 2 by RT PCR: NEGATIVE

## 2020-06-01 MED ORDER — HYDROCODONE-ACETAMINOPHEN 5-325 MG PO TABS: 1.0000 | ORAL_TABLET | Freq: Four times a day (QID) | ORAL | 0 refills | Status: DC | PRN

## 2020-06-01 MED ORDER — DILTIAZEM HCL 30 MG PO TABS
30.0000 mg | ORAL_TABLET | Freq: Two times a day (BID) | ORAL | Status: DC
  Administered 2020-06-01: 30 mg via ORAL
  Filled 2020-06-01: qty 1

## 2020-06-01 MED ORDER — FOOD THICKENER (SIMPLYTHICK): 1.0000 | Freq: Every day | ORAL | 0 refills | Status: DC

## 2020-06-01 MED ORDER — PROSOURCE PLUS PO LIQD: 30.0000 mL | Freq: Every day | ORAL | 0 refills | Status: AC

## 2020-06-01 MED ORDER — LORAZEPAM 0.5 MG PO TABS: 0.5000 mg | ORAL_TABLET | Freq: Two times a day (BID) | ORAL | 0 refills | Status: DC | PRN

## 2020-06-01 MED ORDER — DILTIAZEM HCL 30 MG PO TABS: 30.0000 mg | ORAL_TABLET | Freq: Two times a day (BID) | ORAL | 0 refills | Status: AC

## 2020-06-01 NOTE — Progress Notes (Addendum)
Report given to [facility] [employer], [employer]. [phone] Ex 4320. Explained that patient has received all scheduled medications that were due for today, explained patient has periods of confusion per RN, she familiar with the patient. Patient left via PTAR, alert to self, no other needs identified. RN endorsed understanding.  Called placed to son Novella Rob, and made aware of patient being transported back to [facility] [place].

## 2020-06-01 NOTE — Discharge Summary (Addendum)
Physician Discharge Summary  Vanessa [name] ERX:[id number] DOB: 1920-02-23 DOA: 05/28/2020  PCP: [name], MD  Admit date: 05/28/2020 Discharge date: 06/01/2020  Admitted From: SNF Disposition:  SNF  Recommendations for Outpatient Follow-up and new medication changes:  1. Follow up with Dr. Lyndel Safe in 7 to 10 days.  2. Patient was placed on diltiazem for rate control atrial fibrillation, discontinue atenolol.  3. Not on full anticoagulation due to fall and bleeding risk.  4. Follow up echocardiogram as outpatient.   Home Health: na   Equipment/Devices: na    Discharge Condition: stable  CODE STATUS: DNR   Diet recommendation:  Dysphagia 1 (Puree);Thin liquid   Liquid Administration via: Straw Medication Administration: Crushed with puree Supervision: Full supervision/cueing for compensatory strategies;Staff to assist with self feeding Compensations: Slow rate;Small sips/bites Postural Changes: Seated upright at 90 degrees;Remain upright for at least 30 minutes after po intake     Other  Recommendations Oral Care Recommendations: Oral care BID Other Recommendations: Order thickener from pharmacy      Brief/Interim Summary: Vanessa [name] was admitted to the hospital with the working diagnosis of acute impacted left femoral neck fracture, complicated with atrial fibrillation with RVR.   87 year old female past medical history for atrial fibrillation, carotid artery occlusion, peripheral neuropathy,Zenker'sdiverticulum and dementia who presented after mechanical fall. Apparently patient fell in her nursing home, unable to stand back on her feet. She was found to have a large hematoma left side of her forehead that prompted her to come to the hospital. History was limited due to her cognitive impairment. On her initial physical examination blood pressure 116/77, heart rate 115-125, respiratory rate 18, oxygen saturation 97% her lungs were clear to auscultation  bilaterally, heart S1-S2, present, tachycardic, irregularly irregular, abdomen soft nontender, left leg was externally rotated and shortened.  Sodium 136, potassium 4.8, chloride 100, bicarb 28, glucose 130, BUN 22, creatinine 0.90, white count 19.9, hemoglobin 13.6, hematocrit 41.2, platelets 215. SARS COVID-19 negative.  Head CT with a large left frontal scalp hematoma, no intracranial normalities. No acute fracture dislocation cervical spine.  EKG 110 bpm, normal axis, normal QTC, atrial fibrillation rhythm, no significant ST segment or T wave changes.  Patient underwent left hip hemiarthroplasty on 05/19. Post operative she developed atrial fibrillation with rapid ventricular response and was placed on IV diltiazem with good toleration.  Now transitioned to oral diltiazem with good toleration.   She was evaluated be PT and OT with recommendations to continue therapy at the SNF.  1.  Acute impacted left femoral fracture.  Patient was admitted to the medical ward, she was placed on a remote telemetry. She received analgesia with as needed hydromorphone on her own.  She was continued on treatment. Patient was evaluated by orthopedics and underwent left knee arthroplasty.  Postoperative PT/OT evaluation recommending continued therapy experiencing facility.  2.  Atrial fibrillation with rapid ventricular response.  Postoperatively patient developed rapid ventricular response related to uncontrolled atrial fibrillation. Patient was placed diltiazem infusion appropriate heart rate control. Posteriorly she was transition to oral diltiazem. Dose was adjusted and at discharge patient will continue taking diltiazem 30 mg extended release twice daily.  Discontinue atenolol.   She will need close monitoring of heart rate for further adjustments in AV blockade.  Last echocardiogram is from 2015 with preserved LV systolic function, recommendation to repeat echocardiogram as outpatient.    Advanced age, high fall risk, not a candidate for full anticoagulation.  For now will continue daily aspirin.  3.  Chronic kidney disease stage IIIa.  Kidney function remained stable discharge creatinine 0.91 BUN 40, bicarb 28. Close follow-up of kidney function as an outpatient.  4.  Dementia, severe calorie protein malnutrition/dysphagia.  Patient had episodes of confusion, likely related to her baseline dementia. During her hospitalization she was continued on lorazepam and pregabalin.  She did required haloperidol as needed.   Continue nutritional supplements along with multivitamins. Positive swallowing dyfunction, speech therapy has recommended dysphagia 1 diet with aspiration precautions  5.  Type 2 diabetes mellitus.  She was placed on insulin sliding scale for glucose coverage and monitoring.  Her glucose remained well controlled.  6.  Reactive leukocytosis, postoperative anemia.  No signs of systemic or local infection, her discharge white cell count is 12.5, hemoglobin 10.4, hematocrit 32.4, platelets 197. She did not require any PRBC transfusion.    Discharge Diagnoses:  Principal Problem:   Femur fracture ([place]) Active Problems:   Carotid stenosis   Atrial fibrillation with RVR (HCC)   Secondary diabetes mellitus with stage 3 chronic kidney disease (GFR 30-59) (HCC)   Vascular dementia (HCC)   CKD (chronic kidney disease) stage 3, GFR 30-59 ml/min (HCC)    Discharge Instructions   Allergies as of 06/01/2020   No Known Allergies     Medication List    STOP taking these medications   atenolol 25 MG tablet Commonly known as: TENORMIN   traMADol 50 MG tablet Commonly known as: ULTRAM     TAKE these medications   (feeding supplement) PROSource Plus liquid Take 30 mLs by mouth daily. What changed:   how much to take  when to take this  additional instructions   acetaminophen 500 MG tablet Commonly known as: TYLENOL Take 500 mg by mouth 2 (two) times  daily.   acetaminophen 325 MG tablet Commonly known as: TYLENOL Take 650 mg by mouth every 8 (eight) hours as needed for mild pain.   aspirin 81 MG chewable tablet Chew 81 mg by mouth daily.   diltiazem 30 MG tablet Commonly known as: CARDIZEM Take 1 tablet (30 mg total) by mouth every 12 (twelve) hours.   famotidine 20 MG tablet Commonly known as: PEPCID Take 20 mg by mouth daily.   fluticasone 50 MCG/ACT nasal spray Commonly known as: FLONASE Place 2 sprays into both nostrils daily.   food thickener Gel Commonly known as: SIMPLYTHICK (NECTAR/LEVEL 2/MILDLY THICK) Take 1 packet by mouth 5 (five) times daily.   HYDROcodone-acetaminophen 5-325 MG tablet Commonly known as: NORCO/VICODIN Take 1 tablet by mouth every 6 (six) hours as needed for severe pain.   IBgard 90 MG Cpcr Generic drug: Peppermint Oil Take 180 mg by mouth 2 (two) times daily.   loratadine 10 MG tablet Commonly known as: CLARITIN Take 10 mg by mouth daily as needed for allergies.   LORazepam 0.5 MG tablet Commonly known as: ATIVAN Take 1 tablet (0.5 mg total) by mouth 2 (two) times daily as needed for anxiety (agitation).   Menthol (Topical Analgesic) 4 % Gel Apply 1 application topically every 8 (eight) hours as needed (pain).   polyethylene glycol 17 g packet Commonly known as: MIRALAX / GLYCOLAX Take 8.5-17 g by mouth See admin instructions. 8.5g in the morning scheduled,  And 17g daily as needed for constipation   pregabalin 25 MG capsule Commonly known as: LYRICA Take 1 capsule (25 mg total) by mouth 2 (two) times daily.   sennosides-docusate sodium 8.6-50 MG tablet Commonly known as: SENOKOT-S Take 2  tablets by mouth daily.   sodium fluoride 1.1 % Crea dental cream Commonly known as: PREVIDENT 5000 PLUS Place 1 application onto teeth every evening.   sodium phosphate 7-19 GM/118ML Enem Place 1 enema rectally daily as needed for severe constipation. Give Fleet enema per rectum X 1  after removing hard stool or if soft stool is present in rectum.   Systane Balance 0.6 % Soln Generic drug: Propylene Glycol Give 1 drop to both eyes once daily as needed for dryness   THERA MULTI-VITAMIN PO Take 1 tablet by mouth daily.   triamcinolone cream 0.1 % Commonly known as: KENALOG Apply 1 application topically as needed (itching).   VIACTIV PO Take 1 tablet by mouth daily.   Vitamin B-2 25 MG Tabs Take 25 mg by mouth daily.       Follow-up Information    Swinteck, Aaron Edelman, MD. Schedule an appointment as soon as possible for a visit in 2 weeks.   Specialty: Orthopedic Surgery Why: For wound re-check, For suture removal Contact information: [address] [place] [address] [phone]              No Known Allergies  Consultations:  Orthopedics    Procedures/Studies: DG Chest 1 View  Result Date: 05/28/2020 CLINICAL DATA:  87 year old female status post fall with left femoral neck fracture. EXAM: CHEST  1 VIEW COMPARISON:  Portable chest 03/30/2019 and earlier. FINDINGS: Supine AP views at 0744 hours. Chest is somewhat overpenetrated. Chronic pulmonary hyperinflation. Stable cardiomegaly and mediastinal contours. Allowing for portable technique the lungs are clear. No pneumothorax. Visualized tracheal air column is within normal limits. Advanced degenerative changes at the thoracolumbar junction with partially visible lumbar fusion hardware. No acute osseous abnormality identified in the chest. IMPRESSION: Pulmonary hyperinflation.  No acute cardiopulmonary abnormality. Electronically Signed   By: Genevie Ann M.D.   On: 05/28/2020 07:57   CT Head Wo Contrast  Result Date: 05/28/2020 CLINICAL DATA:  Head and neck trauma Unwitnessed fall Found on floor at nursing home Left forehead hematoma EXAM: CT HEAD WITHOUT CONTRAST CT CERVICAL SPINE WITHOUT CONTRAST TECHNIQUE: Multidetector CT imaging of the head and cervical spine was performed following  the standard protocol without intravenous contrast. Multiplanar CT image reconstructions of the cervical spine were also generated. COMPARISON:  04/05/2020 FINDINGS: CT HEAD FINDINGS Brain: No evidence of acute infarction, hemorrhage, hydrocephalus, extra-axial collection or mass lesion/mass effect. Periventricular white matter hypodensity is a nonspecific finding, but most commonly relates to chronic ischemic small vessel disease. 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 Vascular: No hyperdense vessel or unexpected calcification. Skull: Normal. Negative for fracture or focal lesion. Sinuses/Orbits: No acute finding. Other: Large left frontal hematoma.  Small left posterior hematoma. CT CERVICAL SPINE FINDINGS Alignment: Grade 1 anterolisthesis of C3 on C4 and C4 on C5. Grade 1 retrolisthesis of C5 on C6. Grade 1 anterolisthesis of C7 on T1. Skull base and vertebrae: Anterior subluxation of the dens relative to the tip of the clivus unchanged from prior examination and likely due to ligamentous laxity. No acute fracture or dislocation. Soft tissues and spinal canal: No acute fracture or dislocation is identified. Prevertebral soft tissues are normal thickness. Disc levels: Advanced degenerative changes again seen throughout the cervical spine. Upper chest: Biapical pleuroparenchymal scarring. Other: None IMPRESSION: 1. Large left frontal scalp hematoma. No acute intracranial abnormality. 2. No acute fracture or dislocation of the cervical or visualized upper thoracic spine. Electronically Signed   By: Sharen Heck  Mir M.D.   On: 05/28/2020 08:22   CT Cervical Spine Wo Contrast  Result Date: 05/28/2020 CLINICAL DATA:  Head and neck trauma Unwitnessed fall Found on floor at nursing home Left forehead hematoma EXAM: CT HEAD WITHOUT CONTRAST CT CERVICAL SPINE WITHOUT CONTRAST TECHNIQUE: Multidetector CT imaging of the head and cervical spine was performed following the standard  protocol without intravenous contrast. Multiplanar CT image reconstructions of the cervical spine were also generated. COMPARISON:  04/05/2020 FINDINGS: CT HEAD FINDINGS Brain: No evidence of acute infarction, hemorrhage, hydrocephalus, extra-axial collection or mass lesion/mass effect. Periventricular white matter hypodensity is a nonspecific finding, but most commonly relates to chronic ischemic small vessel disease. 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 Vascular: No hyperdense vessel or unexpected calcification. Skull: Normal. Negative for fracture or focal lesion. Sinuses/Orbits: No acute finding. Other: Large left frontal hematoma.  Small left posterior hematoma. CT CERVICAL SPINE FINDINGS Alignment: Grade 1 anterolisthesis of C3 on C4 and C4 on C5. Grade 1 retrolisthesis of C5 on C6. Grade 1 anterolisthesis of C7 on T1. Skull base and vertebrae: Anterior subluxation of the dens relative to the tip of the clivus unchanged from prior examination and likely due to ligamentous laxity. No acute fracture or dislocation. Soft tissues and spinal canal: No acute fracture or dislocation is identified. Prevertebral soft tissues are normal thickness. Disc levels: Advanced degenerative changes again seen throughout the cervical spine. Upper chest: Biapical pleuroparenchymal scarring. Other: None IMPRESSION: 1. Large left frontal scalp hematoma. No acute intracranial abnormality. 2. No acute fracture or dislocation of the cervical or visualized upper thoracic spine. Electronically Signed   By: Miachel Roux M.D.   On: 05/28/2020 08:22   Pelvis Portable  Result Date: 05/29/2020 CLINICAL DATA:  Postop EXAM: PORTABLE PELVIS 1-2 VIEWS COMPARISON:  04/05/2020, 05/28/2020 FINDINGS: Interval left hip hemiarthroplasty with intact hardware and normal alignment. Gas in the soft tissues consistent with recent surgery. Pubic symphysis and rami are intact IMPRESSION: Left hip replacement with  expected surgical changes Electronically Signed   By: Donavan Foil M.D.   On: 05/29/2020 18:32   DG Knee Complete 4 Views Left  Result Date: 05/28/2020 CLINICAL DATA:  87 year old female status post fall with left femoral neck fracture. EXAM: LEFT KNEE - COMPLETE 4+ VIEW COMPARISON:  Left hip series today.  Left knee series 12/01/2016. FINDINGS: AP oblique and cross-table lateral views. Joint spaces and alignment remain normal for age. No acute osseous abnormality identified. No joint effusion. Bone mineralization is within normal limits for age. IMPRESSION: No acute fracture or dislocation identified about the left knee. Electronically Signed   By: Genevie Ann M.D.   On: 05/28/2020 10:47   DG C-Arm 1-60 Min-No Report  Result Date: 05/29/2020 Fluoroscopy was utilized by the requesting physician.  No radiographic interpretation.   OCT, Retina - OU - Both Eyes  Result Date: 05/16/2020 Right Eye Quality was good. Central Foveal Thickness: 165. Progression has improved. Findings include retinal drusen , inner retinal atrophy, intraretinal hyper-reflective material, abnormal foveal contour, outer retinal atrophy, no SRF, no IRF (Interval improvement in massive central CME -- resolved). Left Eye Quality was good. Central Foveal Thickness: 223. Progression has been stable. Findings include normal foveal contour, no SRF, no IRF, retinal drusen , outer retinal atrophy (Mild patchy ORA; no significant change from prior). Notes *Images captured and stored on drive Diagnosis / Impression: non-exu ARMD OU OD: CRVO; Interval improvement in massive central CME -- resolved OS: NFP; no IRF/SRF; patchy  ORA - stable Clinical management: See below Abbreviations: NFP - Normal foveal profile. CME - cystoid macular edema. PED - pigment epithelial detachment. IRF - intraretinal fluid. SRF - subretinal fluid. EZ - ellipsoid zone. ERM - epiretinal membrane. ORA - outer retinal atrophy. ORT - outer retinal tubulation. SRHM -  subretinal hyper-reflective material   DG HIP OPERATIVE UNILAT W OR W/O PELVIS LEFT  Result Date: 05/29/2020 CLINICAL DATA:  Hip surgery EXAM: OPERATIVE left HIP (WITH PELVIS IF PERFORMED) 2 VIEWS TECHNIQUE: Fluoroscopic spot image(s) were submitted for interpretation post-operatively. COMPARISON:  05/28/2020 FINDINGS: Two low resolution intraoperative spot views of the left hip. Total fluoroscopy time was 4 seconds. Images demonstrate a left hip hemiarthroplasty IMPRESSION: Intraoperative fluoroscopic assistance provided during left hip surgery Electronically Signed   By: Donavan Foil M.D.   On: 05/29/2020 18:31   DG Hip Unilat With Pelvis 2-3 Views Left  Result Date: 05/28/2020 CLINICAL DATA:  87 year old female status post fall with left hip pain. EXAM: DG HIP (WITH OR WITHOUT PELVIS) 2-3V LEFT COMPARISON:  Abdominal radiographs 11/14/2014. FINDINGS: AP and cross-table lateral views demonstrate an acute left femoral neck fracture with impaction. Left femoral head remains normally located. Intertrochanteric segment remains intact. Grossly intact proximal right femur. No acute pelvic fracture identified. Chronic lumbar fusion hardware. Non obstructed visible bowel gas pattern. IMPRESSION: Acute, impacted left femoral neck fracture. Electronically Signed   By: Genevie Ann M.D.   On: 05/28/2020 07:55      Procedures: left hip hemiarthroplasty  Subjective: Patient is at her baseline, no nausea or vomiting, no signs or pain or dyspnea. Off mittens.   Discharge Exam: Vitals:   05/31/20 2000 06/01/20 0400  BP: 118/67 (!) 132/53  Pulse: 88 90  Resp:  16  Temp: 97.6 F (36.4 C) 98.6 F (37 C)  SpO2: 100% 96%   Vitals:   05/31/20 1400 05/31/20 1600 05/31/20 2000 06/01/20 0400  BP: (!) 145/90 120/65 118/67 (!) 132/53  Pulse: 93 70 88 90  Resp: 20 (!) 22  16  Temp:   97.6 F (36.4 C) 98.6 F (37 C)  TempSrc:   Oral Oral  SpO2: 97% 96% 100% 96%    General: Not in pain or dyspnea.   Neurology: Awake and alert, non focal. Positive confusion at her baseline but not agitation.  E ENT: no pallor, no icterus, oral mucosa moist Cardiovascular: No JVD. S1-S2 present, irregularly irregular, no gallops, rubs, or murmurs. No lower extremity edema. Pulmonary: positive breath sounds bilaterally, with no wheezing, rhonchi or rales. Gastrointestinal. Abdomen soft and non tender Skin. No rashes Musculoskeletal: no joint deformities   The results of significant diagnostics from this hospitalization (including imaging, microbiology, ancillary and laboratory) are listed below for reference.     Microbiology: Recent Results (from the past 240 hour(s))  Resp Panel by RT-PCR (Flu A&B, Covid) Nasopharyngeal Swab     Status: None   Collection Time: 05/28/20  8:32 AM   Specimen: Nasopharyngeal Swab; Nasopharyngeal(NP) swabs in vial transport medium  Result Value Ref Range Status   SARS Coronavirus 2 by RT PCR NEGATIVE NEGATIVE Final    Comment: (NOTE) SARS-CoV-2 target nucleic acids are NOT DETECTED.  The SARS-CoV-2 RNA is generally detectable in upper respiratory specimens during the acute phase of infection. The lowest concentration of SARS-CoV-2 viral copies this assay can detect is 138 copies/mL. A negative result does not preclude SARS-Cov-2 infection and should not be used as the sole basis for treatment or other patient management decisions.  A negative result may occur with  improper specimen collection/handling, submission of specimen other than nasopharyngeal swab, presence of viral mutation(s) within the areas targeted by this assay, and inadequate number of viral copies(<138 copies/mL). A negative result must be combined with clinical observations, patient history, and epidemiological information. The expected result is Negative.  Fact Sheet for Patients:  EntrepreneurPulse.com.au  Fact Sheet for Healthcare Providers:   IncredibleEmployment.be  This test is no t yet approved or cleared by the [place] FDA and  has been authorized for detection and/or diagnosis of SARS-CoV-2 by FDA under an Emergency Use Authorization (EUA). This EUA will remain  in effect (meaning this test can be used) for the duration of the COVID-19 declaration under Section 564(b)(1) of the Act, 21 U.S.C.section 360bbb-3(b)(1), unless the authorization is terminated  or revoked sooner.       Influenza A by PCR NEGATIVE NEGATIVE Final   Influenza B by PCR NEGATIVE NEGATIVE Final    Comment: (NOTE) The Xpert Xpress SARS-CoV-2/FLU/RSV plus assay is intended as an aid in the diagnosis of influenza from Nasopharyngeal swab specimens and should not be used as a sole basis for treatment. Nasal washings and aspirates are unacceptable for Xpert Xpress SARS-CoV-2/FLU/RSV testing.  Fact Sheet for Patients: EntrepreneurPulse.com.au  Fact Sheet for Healthcare Providers: IncredibleEmployment.be  This test is not yet approved or cleared by the [place] FDA and has been authorized for detection and/or diagnosis of SARS-CoV-2 by FDA under an Emergency Use Authorization (EUA). This EUA will remain in effect (meaning this test can be used) for the duration of the COVID-19 declaration under Section 564(b)(1) of the Act, 21 U.S.C. section 360bbb-3(b)(1), unless the authorization is terminated or revoked.  Performed at [facility], [place] [address], [place], [place] [address]   MRSA PCR Screening     Status: None   Collection Time: 05/28/20  6:29 PM   Specimen: Nasopharyngeal  Result Value Ref Range Status   MRSA by PCR NEGATIVE NEGATIVE Final    Comment:        The GeneXpert MRSA Assay (FDA approved for NASAL specimens only), is one component of a comprehensive MRSA colonization surveillance program. It is not intended to diagnose MRSA infection nor  to guide or monitor treatment for MRSA infections. Performed at [facility], [place] [address], [place], [place] [address]      Labs: BNP (last 3 results) No results for input(s): BNP in the last 8760 hours. Basic Metabolic Panel: Recent Labs  Lab 05/28/20 0831 05/29/20 0323 05/30/20 0407 05/31/20 0346  NA 136 140 139 139  K 4.8 4.6 4.5 4.4  CL 100 104 105 103  CO2 28 29 27 28   GLUCOSE 130* 141* 168* 143*  BUN 22 25* 33* 40*  CREATININE 0.90 0.87 1.04* 0.91  CALCIUM 9.2 9.4 8.6* 8.8*   Liver Function Tests: Recent Labs  Lab 05/28/20 0831  AST 43*  ALT 18  ALKPHOS 64  BILITOT 1.0  PROT 6.9  ALBUMIN 3.7   No results for input(s): LIPASE, AMYLASE in the last 168 hours. No results for input(s): AMMONIA in the last 168 hours. CBC: Recent Labs  Lab 05/28/20 0831 05/29/20 0323 05/30/20 0407 05/31/20 0346 06/01/20 0357  WBC 19.9* 18.0* 22.7* 14.0* 12.5*  NEUTROABS 17.4*  --   --   --   --   HGB 13.6 13.6 12.3 10.7* 10.4*  HCT 41.2 41.7 37.3 33.3* 32.4*  MCV 105.1* 108.6* 107.5* 108.8* 108.7*  PLT  215 195 186 179 197   Cardiac Enzymes: No results for input(s): CKTOTAL, CKMB, CKMBINDEX, TROPONINI in the last 168 hours. BNP: Invalid input(s): POCBNP CBG: No results for input(s): GLUCAP in the last 168 hours. D-Dimer No results for input(s): DDIMER in the last 72 hours. Hgb A1c No results for input(s): HGBA1C in the last 72 hours. Lipid Profile No results for input(s): CHOL, HDL, LDLCALC, TRIG, CHOLHDL, LDLDIRECT in the last 72 hours. Thyroid function studies No results for input(s): TSH, T4TOTAL, T3FREE, THYROIDAB in the last 72 hours.  Invalid input(s): FREET3 Anemia work up No results for input(s): VITAMINB12, FOLATE, FERRITIN, TIBC, IRON, RETICCTPCT in the last 72 hours. Urinalysis    Component Value Date/Time   COLORURINE YELLOW 04/05/2020 1140   APPEARANCEUR CLEAR 04/05/2020 1140   LABSPEC 1.011 04/05/2020 1140   PHURINE 7.0  04/05/2020 1140   GLUCOSEU NEGATIVE 04/05/2020 1140   HGBUR NEGATIVE 04/05/2020 1140   BILIRUBINUR NEGATIVE 04/05/2020 1140   BILIRUBINUR neg 04/20/2012 1631   KETONESUR NEGATIVE 04/05/2020 1140   PROTEINUR NEGATIVE 04/05/2020 1140   UROBILINOGEN 0.2 01/22/2014 1920   NITRITE NEGATIVE 04/05/2020 1140   LEUKOCYTESUR NEGATIVE 04/05/2020 1140   Sepsis Labs Invalid input(s): PROCALCITONIN,  WBC,  LACTICIDVEN Microbiology Recent Results (from the past 240 hour(s))  Resp Panel by RT-PCR (Flu A&B, Covid) Nasopharyngeal Swab     Status: None   Collection Time: 05/28/20  8:32 AM   Specimen: Nasopharyngeal Swab; Nasopharyngeal(NP) swabs in vial transport medium  Result Value Ref Range Status   SARS Coronavirus 2 by RT PCR NEGATIVE NEGATIVE Final    Comment: (NOTE) SARS-CoV-2 target nucleic acids are NOT DETECTED.  The SARS-CoV-2 RNA is generally detectable in upper respiratory specimens during the acute phase of infection. The lowest concentration of SARS-CoV-2 viral copies this assay can detect is 138 copies/mL. A negative result does not preclude SARS-Cov-2 infection and should not be used as the sole basis for treatment or other patient management decisions. A negative result may occur with  improper specimen collection/handling, submission of specimen other than nasopharyngeal swab, presence of viral mutation(s) within the areas targeted by this assay, and inadequate number of viral copies(<138 copies/mL). A negative result must be combined with clinical observations, patient history, and epidemiological information. The expected result is Negative.  Fact Sheet for Patients:  EntrepreneurPulse.com.au  Fact Sheet for Healthcare Providers:  IncredibleEmployment.be  This test is no t yet approved or cleared by the [place] FDA and  has been authorized for detection and/or diagnosis of SARS-CoV-2 by FDA under an Emergency Use Authorization  (EUA). This EUA will remain  in effect (meaning this test can be used) for the duration of the COVID-19 declaration under Section 564(b)(1) of the Act, 21 U.S.C.section 360bbb-3(b)(1), unless the authorization is terminated  or revoked sooner.       Influenza A by PCR NEGATIVE NEGATIVE Final   Influenza B by PCR NEGATIVE NEGATIVE Final    Comment: (NOTE) The Xpert Xpress SARS-CoV-2/FLU/RSV plus assay is intended as an aid in the diagnosis of influenza from Nasopharyngeal swab specimens and should not be used as a sole basis for treatment. Nasal washings and aspirates are unacceptable for Xpert Xpress SARS-CoV-2/FLU/RSV testing.  Fact Sheet for Patients: EntrepreneurPulse.com.au  Fact Sheet for Healthcare Providers: IncredibleEmployment.be  This test is not yet approved or cleared by the [place] FDA and has been authorized for detection and/or diagnosis of SARS-CoV-2 by FDA under an Emergency Use Authorization (EUA). This EUA will remain in  effect (meaning this test can be used) for the duration of the COVID-19 declaration under Section 564(b)(1) of the Act, 21 U.S.C. section 360bbb-3(b)(1), unless the authorization is terminated or revoked.  Performed at [facility], [place] [address], [place], [place] [address]   MRSA PCR Screening     Status: None   Collection Time: 05/28/20  6:29 PM   Specimen: Nasopharyngeal  Result Value Ref Range Status   MRSA by PCR NEGATIVE NEGATIVE Final    Comment:        The GeneXpert MRSA Assay (FDA approved for NASAL specimens only), is one component of a comprehensive MRSA colonization surveillance program. It is not intended to diagnose MRSA infection nor to guide or monitor treatment for MRSA infections. Performed at [facility], [place] [address], [place], [place] [address]      Time coordinating discharge: 45 minutes  SIGNED:   Tawni Millers, MD  [facility] 06/01/2020, 8:53 AM

## 2020-06-01 NOTE — Progress Notes (Signed)
   Subjective: 3 Days Post-Op Procedure(s) (LRB): ANTERIOR APPROACH HEMI HIP ARTHROPLASTY (Left) Patient seen in rounds for Dr. Lyla Glassing. Patient resting in bed on exam this morning.  History of dementia. Alert and oriented this morning. Asking for someone to help her eat her breakfast. She denies pain in the hip.   Objective: Vital signs in last 24 hours: Temp:  [97.6 F (36.4 C)-98.6 F (37 C)] 98.6 F (37 C) (05/22 0400) Pulse Rate:  [70-93] 90 (05/22 0400) Resp:  [16-23] 16 (05/22 0400) BP: (118-156)/(53-90) 156/85 (05/22 1012) SpO2:  [96 %-100 %] 98 % (05/22 1012)  Intake/Output from previous day:  Intake/Output Summary (Last 24 hours) at 06/01/2020 1050 Last data filed at 06/01/2020 0600 Gross per 24 hour  Intake 340 ml  Output 750 ml  Net -410 ml     Intake/Output this shift: No intake/output data recorded.  Labs: Recent Labs    05/30/20 0407 05/31/20 0346 06/01/20 0357  HGB 12.3 10.7* 10.4*   Recent Labs    05/31/20 0346 06/01/20 0357  WBC 14.0* 12.5*  RBC 3.06* 2.98*  HCT 33.3* 32.4*  PLT 179 197   Recent Labs    05/30/20 0407 05/31/20 0346  NA 139 139  K 4.5 4.4  CL 105 103  CO2 27 28  BUN 33* 40*  CREATININE 1.04* 0.91  GLUCOSE 168* 143*  CALCIUM 8.6* 8.8*   No results for input(s): LABPT, INR in the last 72 hours.  Exam: General - Patient is Alert and Appropriate Extremity - Neurologically intact Sensation intact distally Intact pulses distally Dorsiflexion/Plantar flexion intact Dressing - dressing C/D/I Motor Function - intact, moving foot and toes well on exam.   Past Medical History:  Diagnosis Date  . Abdominal bloating   . Atrial fibrillation ([place])   . Bruises easily   . Carotid artery occlusion    LEFT  . Cricopharyngeal dysphagia   . Dizziness   . DOE (dyspnea on exertion) 04/02/2014  . Fall at home Sept 2013, Dec. 2013  Jun 08, 2012  . GERD (gastroesophageal reflux disease) 02/11/2015  . Headache(784.0)   .  Hoarseness   . Hypercholesterolemia   . Hyperglycemia 04/02/2014   Glucose 178 mg percent on 01/22/2014 04/05/14 Hgb A1c 6.6 Diet controlled.    . Hypertensive retinopathy    OU  . Hypothyroidism 04/15/2015  . Macular degeneration    Dry OU  . Neuropathy    PERIPHERAL  . Pruritus   . Scoliosis   . Varicose veins   . 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   Weight as of 05/19/20: 37.2 kg. Advance diet Up with therapy  DVT Prophylaxis - Aspirin Weight bearing as tolerated.  Continue working with PT as able on mobility. Discharge and disposition per medicine team when stable. Will continue to follow, and available for assistance as needed.    Griffith Citron, PA-C Orthopedic Surgery [phone] 06/01/2020, 10:50 AM

## 2020-06-01 NOTE — TOC Transition Note (Signed)
Transition of Care [facility]) - CM/SW Discharge Note   Patient Details  Name: NELI FOFANA MRN: [id number] Date of Birth: [birth year]  [facility] CM/SW Contact:  Lennart Pall, LCSW Phone Number: 06/01/2020, 11:26 AM   Clinical Narrative:     Alerted by MD that pt is medically ready for return to [facility].  Confirmed with facility they can accept back today.  Have notified pt's son and [facility] called for pick up at 11:35am.  No further TOC needs.  Final next level of care: Skilled Nursing Facility Barriers to Discharge: Barriers Resolved   Patient Goals and CMS Choice        Discharge Placement              Patient chooses bed at: [facility] Patient to be transferred to facility by: [facility] Name of family member notified: son, Rosie Torrez Patient and family notified of of transfer: 06/01/20  Discharge Plan and Services                DME Arranged: N/A DME Agency: NA                  Social Determinants of Health (SDOH) Interventions     Readmission Risk Interventions No flowsheet data found.

## 2020-06-02 ENCOUNTER — Encounter: Payer: Self-pay | Admitting: Orthopedic Surgery

## 2020-06-02 ENCOUNTER — Non-Acute Institutional Stay (SKILLED_NURSING_FACILITY): Payer: Medicare Other | Admitting: Orthopedic Surgery

## 2020-06-02 DIAGNOSIS — S72002D Fracture of unspecified part of neck of left femur, subsequent encounter for closed fracture with routine healing: Secondary | ICD-10-CM

## 2020-06-02 DIAGNOSIS — D72829 Elevated white blood cell count, unspecified: Secondary | ICD-10-CM

## 2020-06-02 DIAGNOSIS — G629 Polyneuropathy, unspecified: Secondary | ICD-10-CM

## 2020-06-02 DIAGNOSIS — N1831 Chronic kidney disease, stage 3a: Secondary | ICD-10-CM

## 2020-06-02 DIAGNOSIS — K5901 Slow transit constipation: Secondary | ICD-10-CM

## 2020-06-02 DIAGNOSIS — R1310 Dysphagia, unspecified: Secondary | ICD-10-CM | POA: Diagnosis not present

## 2020-06-02 DIAGNOSIS — F0150 Vascular dementia without behavioral disturbance: Secondary | ICD-10-CM

## 2020-06-02 DIAGNOSIS — K219 Gastro-esophageal reflux disease without esophagitis: Secondary | ICD-10-CM

## 2020-06-02 DIAGNOSIS — I4891 Unspecified atrial fibrillation: Secondary | ICD-10-CM | POA: Diagnosis not present

## 2020-06-02 DIAGNOSIS — S72002A Fracture of unspecified part of neck of left femur, initial encounter for closed fracture: Secondary | ICD-10-CM | POA: Insufficient documentation

## 2020-06-02 NOTE — Progress Notes (Signed)
Location:   [place] Room Number: [place] of Service:  SNF [phone]) Provider:  Windell Moulding, NP    Patient Care Team: [name], MD as PCP - General (Internal Medicine) Angelia Mould, MD as Attending Physician (Vascular Surgery) [name], MD (Inactive) as Attending Physician (Obstetrics and Gynecology) [name], MD (Orthopedic Surgery) [name], MD (Ophthalmology) [name], MD as Consulting Physician (Urology) [name], MD as Consulting Physician (Cardiology) [name], PA-C as Physician Assistant (Otolaryngology) [name], MD as Consulting Physician (Otolaryngology) [name], MD as Consulting Physician (Dermatology) [name], MD as Consulting Physician (Physical Medicine and Rehabilitation) [name], MD as Attending Physician (Gastroenterology) Mast, Man X, NP as Nurse Practitioner (Internal Medicine) Kathrynn Ducking, MD as Consulting Physician (Neurology) Debara Pickett Nadean Corwin, MD as Consulting Physician (Cardiology) Druscilla Brownie, MD as Consulting Physician (Dermatology) [name], [name], NP as Nurse Practitioner (Family Medicine)  Extended Emergency Contact Information Primary Emergency Contact: [name] of [place] Phone: [phone] Mobile Phone: [phone] Relation: Son Secondary Emergency Contact: Serita Grit [place] of [place] Phone: [phone] Mobile Phone: [phone] Relation: Other  Code Status:  DNR Goals of care: Advanced Directive information Advanced Directives 06/02/2020  Does Patient Have a Medical Advance Directive? Yes  Type of Paramedic of [place];Out of facility DNR (pink MOST or yellow form)  Does patient want to make changes to medical advance directive? No - Patient declined  Copy of [place] in Chart? Yes - validated most recent copy scanned in chart (See row  information)  Would patient like information on creating a medical advance directive? -  Pre-existing out of facility DNR order (yellow form or pink MOST form) Yellow form placed in chart (order not valid for inpatient use);Pink MOST form placed in chart (order not valid for inpatient use)     Chief Complaint  Patient presents with  . Follow-up    Hospital follow up.     HPI:  Pt is a 87 y.o. female seen today for hospital f/u s/p hospitalization at [facility] 05/18- 05/22.   She is a resident of the skilled nursing unit at [facility] 05/18 she was found on the floor by staff, yelling in pain. Poor historian due to dementia, she began to complain of left hip pain after fall. In the ED, she was found to be afebrile, tachycardic and hypertensive. WBC 19.9, covid and flu negative, other labs unremarkable. Left hip xray confirmed left femoral neck fracture. CT head and cervical spine revealed left frontal scalp hematoma otherwise unremarkable. 05/19 she underwent left hip hemiarthroplasty by Dr. Rod Can. Post operative she developed atrial fibrillation with RVR and was placed on IV Cardizem. She was then transitioned to oral Cardizem and metoprolol discontinued. Follow up echocardiogram recommended. She was discharged back to [facility] 05/22, PT/OT ordered.   GERD remains in Pepcid.  Neuropathy stable with lyrica.  Allergies stable with daily Claritin and Flonase.  Constipation, last BM unknown, takes senna daily and prn miralax.  Chronic kidney disease stage 3 discharge creatinine 0.91, BUN 40.  Vascular dementia stable with prn ativan, some confusion noted during hospitalization.  Dysphagia noted, started on dysphagia 1 diet with aspiration precautions.  Leukocytosis trended down during hospitalization, WBC 12.5 at discharge.   Remains total assist with bed mobility. WBAT.   No recent behavioral outbursts.   Recent blood pressures:  05/23- 156/84  05/17- 123/80,  122/67  Nurse does not report any other concerns, vitals stable.    Past Medical History:  Diagnosis Date  . Abdominal bloating   . Atrial fibrillation ([place])   . Bruises easily   . Carotid artery occlusion    LEFT  . Cricopharyngeal dysphagia   . Dizziness   . DOE (dyspnea on exertion) 04/02/2014  . Fall at home Sept 2013, Dec. 2013  Jun 08, 2012  . GERD (gastroesophageal reflux disease) 02/11/2015  . Headache(784.0)   . Hoarseness   . Hypercholesterolemia   . Hyperglycemia 04/02/2014   Glucose 178 mg percent on 01/22/2014 04/05/14 Hgb A1c 6.6 Diet controlled.    . Hypertensive retinopathy    OU  . Hypothyroidism 04/15/2015  . Macular degeneration    Dry OU  . Neuropathy    PERIPHERAL  . Pruritus   . Scoliosis   . Varicose veins   . Zenker's diverticulum    Past Surgical History:  Procedure Laterality Date  . ABDOMINAL HYSTERECTOMY  1954  . CATARACT EXTRACTION Bilateral   . CHOLECYSTECTOMY  1997  . ESOPHAGOGASTRODUODENOSCOPY N/A 03/30/2019   Procedure: ESOPHAGOGASTRODUODENOSCOPY (EGD);  Surgeon: Jerene Bears, MD;  Location: [facility];  Service: Gastroenterology;  Laterality: N/A;  . ESOPHAGOGASTRODUODENOSCOPY (EGD) WITH PROPOFOL N/A 11/25/2016   Procedure: ESOPHAGOGASTRODUODENOSCOPY (EGD) WITH PROPOFOL;  Surgeon: [name], MD;  Location: [facility] ENDOSCOPY;  Service: Endoscopy;  Laterality: N/A;  . EYE SURGERY Bilateral    Cat Sx  . SPINE SURGERY  1997   correct scoliosis    No Known Allergies  Allergies as of 06/02/2020   No Known Allergies     Medication List       Accurate as of Jun 02, 2020  9:14 AM. If you have any questions, ask your nurse or doctor.        STOP taking these medications   food thickener Gel Commonly known as: SIMPLYTHICK (NECTAR/LEVEL 2/MILDLY THICK) Stopped by: Yvonna Alanis, NP   triamcinolone cream 0.1 % Commonly known as: KENALOG Stopped by: Yvonna Alanis, NP     TAKE these medications   (feeding supplement) PROSource Plus  liquid Take 30 mLs by mouth daily.   acetaminophen 500 MG tablet Commonly known as: TYLENOL Take 500 mg by mouth 2 (two) times daily.   acetaminophen 325 MG tablet Commonly known as: TYLENOL Take 650 mg by mouth every 12 (twelve) hours as needed for mild pain.   aspirin 81 MG chewable tablet Chew 81 mg by mouth daily.   diltiazem 30 MG tablet Commonly known as: CARDIZEM Take 1 tablet (30 mg total) by mouth every 12 (twelve) hours.   famotidine 20 MG tablet Commonly known as: PEPCID Take 20 mg by mouth daily.   fluticasone 50 MCG/ACT nasal spray Commonly known as: FLONASE Place 2 sprays into both nostrils daily.   HYDROcodone-acetaminophen 5-325 MG tablet Commonly known as: NORCO/VICODIN Take 1 tablet by mouth every 6 (six) hours as needed for severe pain.   IBgard 90 MG Cpcr Generic drug: Peppermint Oil Take 180 mg by mouth 2 (two) times daily.   loratadine 10 MG tablet Commonly known as: CLARITIN Take 10 mg by mouth daily as needed for allergies.   LORazepam 0.5 MG tablet Commonly known as: ATIVAN Take 1 tablet (0.5 mg total) by mouth 2 (two) times daily as needed for anxiety (agitation).   Menthol (Topical Analgesic) 4 % Gel Apply 1 application topically every 8 (eight) hours as needed (pain).   polyethylene glycol 17 g  packet Commonly known as: MIRALAX / GLYCOLAX Take 8.5-17 g by mouth See admin instructions. 8.5g in the morning scheduled,  And 17g daily as needed for constipation   pregabalin 25 MG capsule Commonly known as: LYRICA Take 1 capsule (25 mg total) by mouth 2 (two) times daily.   sennosides-docusate sodium 8.6-50 MG tablet Commonly known as: SENOKOT-S Take 2 tablets by mouth daily.   sodium fluoride 1.1 % Crea dental cream Commonly known as: PREVIDENT 5000 PLUS Place 1 application onto teeth every evening.   sodium phosphate 7-19 GM/118ML Enem Place 1 enema rectally daily as needed for severe constipation. Give Fleet enema per rectum X 1  after removing hard stool or if soft stool is present in rectum.   Systane Balance 0.6 % Soln Generic drug: Propylene Glycol Give 1 drop to both eyes once daily as needed for dryness   THERA MULTI-VITAMIN PO Take 1 tablet by mouth daily.   VIACTIV PO Take 1 tablet by mouth daily.   Vitamin B-2 25 MG Tabs Take 25 mg by mouth daily.   ZINC OXIDE (TOPICAL) 25 % Oint Apply 1 application topically 3 (three) times daily as needed.       Review of Systems  Unable to perform ROS: Dementia    Immunization History  Administered Date(s) Administered  . Influenza Split 10/20/2011, 11/03/2012  . Influenza, High Dose Seasonal PF 10/24/2018  . Influenza,inj,Quad PF,6+ Mos 09/29/2012, 11/09/2013  . Influenza-Unspecified 10/17/2014, 10/23/2015, 10/20/2016, 10/17/2017, 10/16/2019  . Moderna Sars-Covid-2 Vaccination 01/15/2019, 02/12/2019, 11/26/2019  . PPD Test 08/21/2013, 03/04/2014  . Pneumococcal Conjugate-13 08/21/2013  . Pneumococcal Polysaccharide-23 09/08/2016  . Tdap 05/20/2008  . Tetanus 11/02/2018  . Zoster 11/01/2012   Pertinent  Health Maintenance Due  Topic Date Due  . FOOT EXAM  Never done  . OPHTHALMOLOGY EXAM  Never done  . URINE MICROALBUMIN  Never done  . HEMOGLOBIN A1C  06/28/2019  . INFLUENZA VACCINE  08/11/2020  . DEXA SCAN  Completed  . PNA vac Low Risk Adult  Completed   Fall Risk  11/04/2017 09/05/2017 09/22/2016 08/25/2016 05/31/2016  Falls in the past year? Yes Yes Yes Yes No  Number falls in past yr: 2 or more 2 or more 1 2 or more -  Comment - - - - -  Injury with Fall? Yes Yes Yes Yes -  Comment - - fx 3 ribs - -  Risk Factor Category  - - - - -  Risk for fall due to : - - Impaired mobility;Impaired balance/gait - -  Risk for fall due to: Comment - - - - -   Functional Status Survey:    Vitals:   06/02/20 0859  BP: (!) 156/84  Pulse: 82  Resp: 20  Temp: 98.8 F (37.1 C)  SpO2: 90%  Weight: 82 lb (37.2 kg)  Height: 5\' 2"  (1.575 m)   Body  mass index is 15 kg/m. Physical Exam Vitals reviewed.  Constitutional:      General: She is not in acute distress. HENT:     Head: Normocephalic.     Comments: Left forehead contusion with mild swelling and discoloration present. Skin yellow-purple in color. No skin breakdown.     Right Ear: There is no impacted cerumen.     Left Ear: There is no impacted cerumen.     Nose: Nose normal.     Mouth/Throat:     Mouth: Mucous membranes are moist.  Eyes:     General:  Right eye: No discharge.        Left eye: No discharge.  Cardiovascular:     Rate and Rhythm: Normal rate. Rhythm irregular.     Pulses: Normal pulses.     Heart sounds: Normal heart sounds. No murmur heard.   Pulmonary:     Effort: Pulmonary effort is normal. No respiratory distress.     Breath sounds: Normal breath sounds. No wheezing.  Abdominal:     General: Abdomen is flat. There is no distension.     Palpations: Abdomen is soft.     Tenderness: There is no abdominal tenderness.  Musculoskeletal:     Cervical back: Normal range of motion.     Right lower leg: No edema.     Left lower leg: No edema.     Comments: Left hip Aquacell CDI. Old drainage marked. Skin surrounding incision intact.   Lymphadenopathy:     Cervical: No cervical adenopathy.  Skin:    General: Skin is warm and dry.     Capillary Refill: Capillary refill takes less than 2 seconds.     Findings: Bruising present.  Neurological:     General: No focal deficit present.     Mental Status: She is alert. Mental status is at baseline.     Motor: Weakness present.     Gait: Gait abnormal.  Psychiatric:        Mood and Affect: Mood normal.        Behavior: Behavior normal.        Cognition and Memory: Memory is impaired.     Labs reviewed: Recent Labs    05/29/20 0323 05/30/20 0407 05/31/20 0346  NA 140 139 139  K 4.6 4.5 4.4  CL 104 105 103  CO2 29 27 28   GLUCOSE 141* 168* 143*  BUN 25* 33* 40*  CREATININE 0.87 1.04* 0.91   CALCIUM 9.4 8.6* 8.8*   Recent Labs    01/21/20 0000 02/10/20 1330 05/28/20 0831  AST 20 27 43*  ALT 12 19 18   ALKPHOS 69 62 64  BILITOT  --  0.7 1.0  PROT  --  6.7 6.9  ALBUMIN 3.3* 3.6 3.7   Recent Labs    04/05/20 1025 04/10/20 0000 05/28/20 0831 05/29/20 0323 05/30/20 0407 05/31/20 0346 06/01/20 0357  WBC 15.7* 10.8 19.9*   < > 22.7* 14.0* 12.5*  NEUTROABS 13.3* 8,046.00 17.4*  --   --   --   --   HGB 14.4 14.6 13.6   < > 12.3 10.7* 10.4*  HCT 43.0 43 41.2   < > 37.3 33.3* 32.4*  MCV 104.4*  --  105.1*   < > 107.5* 108.8* 108.7*  PLT 152 228 215   < > 186 179 197   < > = values in this interval not displayed.   Lab Results  Component Value Date   TSH 2.68 09/03/2019   Lab Results  Component Value Date   HGBA1C 5.8 12/28/2018   Lab Results  Component Value Date   CHOL 187 11/03/2017   HDL 84 (A) 11/03/2017   LDLCALC 80 11/03/2017   TRIG 136 11/03/2017   CHOLHDL 2.1 07/29/2016    Significant Diagnostic Results in last 30 days:  DG Chest 1 View  Result Date: 05/28/2020 CLINICAL DATA:  87 year old female status post fall with left femoral neck fracture. EXAM: CHEST  1 VIEW COMPARISON:  Portable chest 03/30/2019 and earlier. FINDINGS: Supine AP views at 0744 hours. Chest is somewhat overpenetrated.  Chronic pulmonary hyperinflation. Stable cardiomegaly and mediastinal contours. Allowing for portable technique the lungs are clear. No pneumothorax. Visualized tracheal air column is within normal limits. Advanced degenerative changes at the thoracolumbar junction with partially visible lumbar fusion hardware. No acute osseous abnormality identified in the chest. IMPRESSION: Pulmonary hyperinflation.  No acute cardiopulmonary abnormality. Electronically Signed   By: Genevie Ann M.D.   On: 05/28/2020 07:57   CT Head Wo Contrast  Result Date: 05/28/2020 CLINICAL DATA:  Head and neck trauma Unwitnessed fall Found on floor at nursing home Left forehead hematoma EXAM: CT  HEAD WITHOUT CONTRAST CT CERVICAL SPINE WITHOUT CONTRAST TECHNIQUE: Multidetector CT imaging of the head and cervical spine was performed following the standard protocol without intravenous contrast. Multiplanar CT image reconstructions of the cervical spine were also generated. COMPARISON:  04/05/2020 FINDINGS: CT HEAD FINDINGS Brain: No evidence of acute infarction, hemorrhage, hydrocephalus, extra-axial collection or mass lesion/mass effect. Periventricular white matter hypodensity is a nonspecific finding, but most commonly relates to chronic ischemic small vessel disease. 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 Vascular: No hyperdense vessel or unexpected calcification. Skull: Normal. Negative for fracture or focal lesion. Sinuses/Orbits: No acute finding. Other: Large left frontal hematoma.  Small left posterior hematoma. CT CERVICAL SPINE FINDINGS Alignment: Grade 1 anterolisthesis of C3 on C4 and C4 on C5. Grade 1 retrolisthesis of C5 on C6. Grade 1 anterolisthesis of C7 on T1. Skull base and vertebrae: Anterior subluxation of the dens relative to the tip of the clivus unchanged from prior examination and likely due to ligamentous laxity. No acute fracture or dislocation. Soft tissues and spinal canal: No acute fracture or dislocation is identified. Prevertebral soft tissues are normal thickness. Disc levels: Advanced degenerative changes again seen throughout the cervical spine. Upper chest: Biapical pleuroparenchymal scarring. Other: None IMPRESSION: 1. Large left frontal scalp hematoma. No acute intracranial abnormality. 2. No acute fracture or dislocation of the cervical or visualized upper thoracic spine. Electronically Signed   By: Miachel Roux M.D.   On: 05/28/2020 08:22   CT Cervical Spine Wo Contrast  Result Date: 05/28/2020 CLINICAL DATA:  Head and neck trauma Unwitnessed fall Found on floor at nursing home Left forehead hematoma EXAM: CT HEAD WITHOUT  CONTRAST CT CERVICAL SPINE WITHOUT CONTRAST TECHNIQUE: Multidetector CT imaging of the head and cervical spine was performed following the standard protocol without intravenous contrast. Multiplanar CT image reconstructions of the cervical spine were also generated. COMPARISON:  04/05/2020 FINDINGS: CT HEAD FINDINGS Brain: No evidence of acute infarction, hemorrhage, hydrocephalus, extra-axial collection or mass lesion/mass effect. Periventricular white matter hypodensity is a nonspecific finding, but most commonly relates to chronic ischemic small vessel disease. 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 Vascular: No hyperdense vessel or unexpected calcification. Skull: Normal. Negative for fracture or focal lesion. Sinuses/Orbits: No acute finding. Other: Large left frontal hematoma.  Small left posterior hematoma. CT CERVICAL SPINE FINDINGS Alignment: Grade 1 anterolisthesis of C3 on C4 and C4 on C5. Grade 1 retrolisthesis of C5 on C6. Grade 1 anterolisthesis of C7 on T1. Skull base and vertebrae: Anterior subluxation of the dens relative to the tip of the clivus unchanged from prior examination and likely due to ligamentous laxity. No acute fracture or dislocation. Soft tissues and spinal canal: No acute fracture or dislocation is identified. Prevertebral soft tissues are normal thickness. Disc levels: Advanced degenerative changes again seen throughout the cervical spine. Upper chest: Biapical pleuroparenchymal scarring. Other: None IMPRESSION: 1. Large  left frontal scalp hematoma. No acute intracranial abnormality. 2. No acute fracture or dislocation of the cervical or visualized upper thoracic spine. Electronically Signed   By: Miachel Roux M.D.   On: 05/28/2020 08:22   Pelvis Portable  Result Date: 05/29/2020 CLINICAL DATA:  Postop EXAM: PORTABLE PELVIS 1-2 VIEWS COMPARISON:  04/05/2020, 05/28/2020 FINDINGS: Interval left hip hemiarthroplasty with intact hardware and  normal alignment. Gas in the soft tissues consistent with recent surgery. Pubic symphysis and rami are intact IMPRESSION: Left hip replacement with expected surgical changes Electronically Signed   By: Donavan Foil M.D.   On: 05/29/2020 18:32   DG Knee Complete 4 Views Left  Result Date: 05/28/2020 CLINICAL DATA:  87 year old female status post fall with left femoral neck fracture. EXAM: LEFT KNEE - COMPLETE 4+ VIEW COMPARISON:  Left hip series today.  Left knee series 12/01/2016. FINDINGS: AP oblique and cross-table lateral views. Joint spaces and alignment remain normal for age. No acute osseous abnormality identified. No joint effusion. Bone mineralization is within normal limits for age. IMPRESSION: No acute fracture or dislocation identified about the left knee. Electronically Signed   By: Genevie Ann M.D.   On: 05/28/2020 10:47   DG C-Arm 1-60 Min-No Report  Result Date: 05/29/2020 Fluoroscopy was utilized by the requesting physician.  No radiographic interpretation.   OCT, Retina - OU - Both Eyes  Result Date: 05/16/2020 Right Eye Quality was good. Central Foveal Thickness: 165. Progression has improved. Findings include retinal drusen , inner retinal atrophy, intraretinal hyper-reflective material, abnormal foveal contour, outer retinal atrophy, no SRF, no IRF (Interval improvement in massive central CME -- resolved). Left Eye Quality was good. Central Foveal Thickness: 223. Progression has been stable. Findings include normal foveal contour, no SRF, no IRF, retinal drusen , outer retinal atrophy (Mild patchy ORA; no significant change from prior). Notes *Images captured and stored on drive Diagnosis / Impression: non-exu ARMD OU OD: CRVO; Interval improvement in massive central CME -- resolved OS: NFP; no IRF/SRF; patchy ORA - stable Clinical management: See below Abbreviations: NFP - Normal foveal profile. CME - cystoid macular edema. PED - pigment epithelial detachment. IRF - intraretinal fluid.  SRF - subretinal fluid. EZ - ellipsoid zone. ERM - epiretinal membrane. ORA - outer retinal atrophy. ORT - outer retinal tubulation. SRHM - subretinal hyper-reflective material   DG HIP OPERATIVE UNILAT W OR W/O PELVIS LEFT  Result Date: 05/29/2020 CLINICAL DATA:  Hip surgery EXAM: OPERATIVE left HIP (WITH PELVIS IF PERFORMED) 2 VIEWS TECHNIQUE: Fluoroscopic spot image(s) were submitted for interpretation post-operatively. COMPARISON:  05/28/2020 FINDINGS: Two low resolution intraoperative spot views of the left hip. Total fluoroscopy time was 4 seconds. Images demonstrate a left hip hemiarthroplasty IMPRESSION: Intraoperative fluoroscopic assistance provided during left hip surgery Electronically Signed   By: Donavan Foil M.D.   On: 05/29/2020 18:31   DG Hip Unilat With Pelvis 2-3 Views Left  Result Date: 05/28/2020 CLINICAL DATA:  87 year old female status post fall with left hip pain. EXAM: DG HIP (WITH OR WITHOUT PELVIS) 2-3V LEFT COMPARISON:  Abdominal radiographs 11/14/2014. FINDINGS: AP and cross-table lateral views demonstrate an acute left femoral neck fracture with impaction. Left femoral head remains normally located. Intertrochanteric segment remains intact. Grossly intact proximal right femur. No acute pelvic fracture identified. Chronic lumbar fusion hardware. Non obstructed visible bowel gas pattern. IMPRESSION: Acute, impacted left femoral neck fracture. Electronically Signed   By: Genevie Ann M.D.   On: 05/28/2020 07:55    Assessment/Plan 1. Closed  fracture of neck of left femur with routine healing, subsequent encounter - 05/19 left hemiarthroplasty - aquacell CDI, remains WBAT - cont aspirin daily for dvt prophylaxis - cont norco 5/325 mg Q6hrs for pain - cont PT/OT - last BM unknown- cont senna and prn miralax - 2 week f/u with Dr. Lyla Glassing  2. Atrial fibrillation with RVR (HCC) - a fib with RVR postoperative - rate controlled with oral cardizem - repeat echo advised   3.  Vascular dementia without behavioral disturbance (HCC) - no behavioral outbursts - cont prn ativan  4. Leukocytosis, unspecified type - WBC 19 at beginning of hospitalization, 12.5 at discharge - cbc/diff- future  5. Dysphagia, unspecified type - stable with dysphagia 1 diet  6. Neuropathy - stable with daily Lyrica  7. Stage 3a chronic kidney disease (HCC) - BUn 40, Creat 0.91, GFR 57 - continue to avoid nephrotoxic drugs like NSAIDS and dose adjust medications to be renally excreted - encourage hydration - bmp- future  8. Gastroesophageal reflux disease without esophagitis - stable with daily pepcid  9. Slow transit constipation - increased risk due to recent immobility and narcotic pain use - abdomen soft today, bowel sounds normal - last BM unknown - nurse reports poor appetite - cont senna and prn miralax - encourage fluids    Family/ staff Communication: plan discussed with patient and nurse  Labs/tests ordered:  Cbc/diff, bmp one week

## 2020-06-05 ENCOUNTER — Encounter: Payer: Self-pay | Admitting: Internal Medicine

## 2020-06-05 ENCOUNTER — Non-Acute Institutional Stay (SKILLED_NURSING_FACILITY): Payer: Medicare Other | Admitting: Internal Medicine

## 2020-06-05 DIAGNOSIS — F411 Generalized anxiety disorder: Secondary | ICD-10-CM

## 2020-06-05 DIAGNOSIS — D72829 Elevated white blood cell count, unspecified: Secondary | ICD-10-CM | POA: Diagnosis not present

## 2020-06-05 DIAGNOSIS — R1310 Dysphagia, unspecified: Secondary | ICD-10-CM | POA: Diagnosis not present

## 2020-06-05 DIAGNOSIS — S72002D Fracture of unspecified part of neck of left femur, subsequent encounter for closed fracture with routine healing: Secondary | ICD-10-CM

## 2020-06-05 DIAGNOSIS — I4891 Unspecified atrial fibrillation: Secondary | ICD-10-CM

## 2020-06-05 DIAGNOSIS — G629 Polyneuropathy, unspecified: Secondary | ICD-10-CM

## 2020-06-05 DIAGNOSIS — F0150 Vascular dementia without behavioral disturbance: Secondary | ICD-10-CM

## 2020-06-05 DIAGNOSIS — N1831 Chronic kidney disease, stage 3a: Secondary | ICD-10-CM

## 2020-06-05 DIAGNOSIS — K219 Gastro-esophageal reflux disease without esophagitis: Secondary | ICD-10-CM

## 2020-06-05 MED ORDER — OXYCODONE HCL 5 MG PO TABS: 5.0000 mg | ORAL_TABLET | Freq: Four times a day (QID) | ORAL | 0 refills | Status: DC | PRN

## 2020-06-05 NOTE — Progress Notes (Signed)
Provider:  Veleta Miners MD Location:    [facility] Room Number: 5 Place of Service:  SNF ([id number])  PCP: [name], MD Patient Care Team: [name], MD as PCP - General (Internal Medicine) Angelia Mould, MD as Attending Physician (Vascular Surgery) [name], MD (Inactive) as Attending Physician (Obstetrics and Gynecology) [name], MD (Orthopedic Surgery) [name], MD (Ophthalmology) [name], MD as Consulting Physician (Urology) [name], MD as Consulting Physician (Cardiology) [name], PA-C as Physician Assistant (Otolaryngology) [name], MD as Consulting Physician (Otolaryngology) [name], MD as Consulting Physician (Dermatology) [name], MD as Consulting Physician (Physical Medicine and Rehabilitation) [name], MD as Attending Physician (Gastroenterology) Mast, Man X, NP as Nurse Practitioner (Internal Medicine) Kathrynn Ducking, MD as Consulting Physician (Neurology) Debara Pickett Nadean Corwin, MD as Consulting Physician (Cardiology) Druscilla Brownie, MD as Consulting Physician (Dermatology) [name], [name], NP as Nurse Practitioner (Family Medicine)  Extended Emergency Contact Information Primary Emergency Contact: [name] of [place] Phone: [phone] Mobile Phone: [phone] Relation: Son Secondary Emergency Contact: Serita Grit [place] of [place] Phone: [phone] Mobile Phone: [phone] Relation: Other  Code Status: DNR Goals of Care: Advanced Directive information Advanced Directives 06/05/2020  Does Patient Have a Medical Advance Directive? Yes  Type of Paramedic of [place];Out of facility DNR (pink MOST or yellow form)  Does patient want to make changes to medical advance directive? No - Patient declined  Copy of [place] in Chart? Yes - validated most recent copy scanned in  chart (See row information)  Would patient like information on creating a medical advance directive? -  Pre-existing out of facility DNR order (yellow form or pink MOST form) Yellow form placed in chart (order not valid for inpatient use);Pink MOST form placed in chart (order not valid for inpatient use)      Chief Complaint  Patient presents with  . Readmit To SNF    Readmission to SNF    HPI: Patient is a 87 y.o. female seen today for REadmission to SNF for therapy and Long term care  Admitted in hospital from 5/18-5/22 for Left Femoral Neck Fracture s/p Hemiarthroplasty  Patient has h/ochronic atrial fibrillation not on any coagulation, GERD, hypothyroidism, hyperlipidemia, neuropathy,,  cognitive impairment, H/o Syncope, h/o IBS and Esophageal Stenosiss/p Dilatation.  She also Has H/o Retinal Vein Occlusion with Edema S/P Vitreal Injections  H/o Cognitive impairment  [place] in her Room Was found to have Left hip Fracture. Also had Rapid A Fibrillation post op needing Cardizem. Lopressor discontinued and Cardizem started No Anticoagulation due to her fall and Bleed Risk She is WBAT Per Nurses c/o Inadequate pain control Very frail Poor Appetite On Regular Puree diet Patient unable ot give much history    Past Medical History:  Diagnosis Date  . Abdominal bloating   . Atrial fibrillation ([place])   . Bruises easily   . Carotid artery occlusion    LEFT  . Cricopharyngeal dysphagia   . Dizziness   . DOE (dyspnea on exertion) 04/02/2014  . Fall at home Sept 2013, Dec. 2013  Jun 08, 2012  . GERD (gastroesophageal reflux disease) 02/11/2015  . Headache(784.0)   . Hoarseness   . Hypercholesterolemia   . Hyperglycemia 04/02/2014   Glucose 178 mg percent on 01/22/2014 04/05/14 Hgb A1c 6.6 Diet controlled.    . Hypertensive retinopathy    OU  . Hypothyroidism 04/15/2015  . Macular  degeneration    Dry OU  . Neuropathy    PERIPHERAL  . Pruritus   . Scoliosis   . Varicose  veins   . Zenker's diverticulum    Past Surgical History:  Procedure Laterality Date  . ABDOMINAL HYSTERECTOMY  1954  . ANTERIOR APPROACH HEMI HIP ARTHROPLASTY Left 05/29/2020   Procedure: ANTERIOR APPROACH HEMI HIP ARTHROPLASTY;  Surgeon: Rod Can, MD;  Location: [facility];  Service: Orthopedics;  Laterality: Left;  . CATARACT EXTRACTION Bilateral   . CHOLECYSTECTOMY  1997  . ESOPHAGOGASTRODUODENOSCOPY N/A 03/30/2019   Procedure: ESOPHAGOGASTRODUODENOSCOPY (EGD);  Surgeon: Jerene Bears, MD;  Location: [facility];  Service: Gastroenterology;  Laterality: N/A;  . ESOPHAGOGASTRODUODENOSCOPY (EGD) WITH PROPOFOL N/A 11/25/2016   Procedure: ESOPHAGOGASTRODUODENOSCOPY (EGD) WITH PROPOFOL;  Surgeon: [name], MD;  Location: [facility];  Service: Endoscopy;  Laterality: N/A;  . EYE SURGERY Bilateral    Cat Sx  . [place]   correct scoliosis    reports that she has never smoked. She has never used smokeless tobacco. She reports that she does not drink alcohol and does not use drugs. Social History   Socioeconomic History  . Marital status: Widowed    Spouse name: Not on file  . Number of children: 2  . Years of education: 12+  . Highest education level: Not on file  Occupational History  . 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 Smoking status: Never Smoker  . Smokeless tobacco: Never Used  Vaping Use  . Vaping Use: Never used  Substance and Sexual Activity  . Alcohol use: No    Alcohol/week: 0.0 standard drinks  . Drug use: No  . Sexual activity: Never    Birth control/protection: None  Other Topics Concern  . Not on file  Social History Narrative   Patient lives at [facility]   Patient is a widowed.    Patient is retired.    Patient has no children but adopted twin sons.    Patient has a college degree.    Never smoked   Alcohol none   Exercise with therapy   Walks with  cane   POA       Patient only drinks de-caf drinks.   Patient is right handed.   Social Determinants of Health   Financial Resource Strain: Not on file  Food Insecurity: Not on file  Transportation Needs: Not on file  Physical Activity: Not on file  Stress: Not on file  Social Connections: Not on file  Intimate Partner Violence: Not on file    Functional Status Survey:    Family History  Adopted: Yes  Problem Relation Age of Onset  . Diabetes Mother   . Heart attack Mother   . Heart disease Mother        After age 50  . Heart attack Father   . Stroke Father   . Breast cancer Sister   . Heart disease Maternal Grandmother   . Cancer Son        adopted son. esophagus, stomach, liver  . Stomach cancer Neg Hx   . Colon cancer Neg Hx     Health Maintenance  Topic Date Due  . FOOT EXAM  Never done  . OPHTHALMOLOGY EXAM  Never done  . URINE MICROALBUMIN  Never done  . Zoster Vaccines- Shingrix (1 of 2) Never done  . HEMOGLOBIN A1C  06/28/2019  . COVID-19 Vaccine (4 -  Booster for Moderna series) 02/26/2020  . INFLUENZA VACCINE  08/11/2020  . TETANUS/TDAP  11/01/2028  . DEXA SCAN  Completed  . PNA vac Low Risk Adult  Completed  . HPV VACCINES  Aged Out    No Known Allergies  Allergies as of 06/05/2020   No Known Allergies     Medication List       Accurate as of Jun 05, 2020 11:59 AM. If you have any questions, ask your nurse or doctor.        STOP taking these medications   VIACTIV PO Stopped by: [name], MD     TAKE these medications   (feeding supplement) PROSource Plus liquid Take 30 mLs by mouth daily.   acetaminophen 500 MG tablet Commonly known as: TYLENOL Take 500 mg by mouth 2 (two) times daily.   acetaminophen 325 MG tablet Commonly known as: TYLENOL Take 650 mg by mouth every 12 (twelve) hours as needed for mild pain.   aspirin 81 MG chewable tablet Chew 81 mg by mouth daily.   diltiazem 30 MG tablet Commonly known as:  CARDIZEM Take 1 tablet (30 mg total) by mouth every 12 (twelve) hours.   famotidine 20 MG tablet Commonly known as: PEPCID Take 20 mg by mouth daily.   fluticasone 50 MCG/ACT nasal spray Commonly known as: FLONASE Place 2 sprays into both nostrils daily.   HYDROcodone-acetaminophen 5-325 MG tablet Commonly known as: NORCO/VICODIN Take 1 tablet by mouth every 6 (six) hours as needed for severe pain.   IBgard 90 MG Cpcr Generic drug: Peppermint Oil Take 180 mg by mouth 2 (two) times daily.   loratadine 10 MG tablet Commonly known as: CLARITIN Take 10 mg by mouth daily as needed for allergies.   LORazepam 0.5 MG tablet Commonly known as: ATIVAN Take 1 tablet (0.5 mg total) by mouth 2 (two) times daily as needed for anxiety (agitation).   Menthol (Topical Analgesic) 4 % Gel Apply 1 application topically every 8 (eight) hours as needed (pain).   NON FORMULARY Med Pass liquid; 2.0; amt: 180 ML; oral Special Instructions: Med pass 2.0 180ML. Doc- % consumed. Three Times A Day   polyethylene glycol 17 g packet Commonly known as: MIRALAX / GLYCOLAX Take 8.5-17 g by mouth See admin instructions. 8.5g in the morning scheduled,  And 17g daily as needed for constipation   pregabalin 25 MG capsule Commonly known as: LYRICA Take 1 capsule (25 mg total) by mouth 2 (two) times daily.   sennosides-docusate sodium 8.6-50 MG tablet Commonly known as: SENOKOT-S Take 2 tablets by mouth daily.   sodium fluoride 1.1 % Crea dental cream Commonly known as: PREVIDENT 5000 PLUS Place 1 application onto teeth every evening.   sodium phosphate 7-19 GM/118ML Enem Place 1 enema rectally daily as needed for severe constipation. Give Fleet enema per rectum X 1 after removing hard stool or if soft stool is present in rectum.   Systane Balance 0.6 % Soln Generic drug: Propylene Glycol Give 1 drop to both eyes once daily as needed for dryness   THERA MULTI-VITAMIN PO Take 1 tablet by mouth  daily.   Vitamin B-2 25 MG Tabs Take 25 mg by mouth daily.   ZINC OXIDE (TOPICAL) 25 % Oint Apply 1 application topically 3 (three) times daily as needed.       Review of Systems  Unable to perform ROS: Dementia    I am in Pain That's all she will tell me   Vitals:  06/05/20 1147  BP: (!) 141/84  Pulse: 61  Resp: 18  Temp: (!) 97.1 F (36.2 C)  SpO2: 96%  Weight: 82 lb (37.2 kg)  Height: 5\' 2"  (1.575 m)   Body mass index is 15 kg/m. Physical Exam Vitals reviewed.  Constitutional:      Comments: Frail  HENT:     Head: Normocephalic.     Nose: Nose normal.     Mouth/Throat:     Mouth: Mucous membranes are moist.     Pharynx: Oropharynx is clear.  Eyes:     Pupils: Pupils are equal, round, and reactive to light.  Cardiovascular:     Rate and Rhythm: Normal rate and regular rhythm.     Pulses: Normal pulses.     Heart sounds: Normal heart sounds.  Pulmonary:     Effort: Pulmonary effort is normal. No respiratory distress.     Breath sounds: Normal breath sounds. No wheezing or rales.  Abdominal:     General: Abdomen is flat. Bowel sounds are normal.     Palpations: Abdomen is soft.  Musculoskeletal:        General: No swelling.     Cervical back: Neck supple.  Skin:    General: Skin is warm.     Comments: Has bruises in her Hands. And Big Hematoma in her Head  Neurological:     General: No focal deficit present.     Mental Status: She is alert.  Psychiatric:        Mood and Affect: Mood normal.        Thought Content: Thought content normal.     Labs reviewed: Basic Metabolic Panel: Recent Labs    05/29/20 0323 05/30/20 0407 05/31/20 0346  NA 140 139 139  K 4.6 4.5 4.4  CL 104 105 103  CO2 29 27 28   GLUCOSE 141* 168* 143*  BUN 25* 33* 40*  CREATININE 0.87 1.04* 0.91  CALCIUM 9.4 8.6* 8.8*   Liver Function Tests: Recent Labs    01/21/20 0000 02/10/20 1330 05/28/20 0831  AST 20 27 43*  ALT 12 19 18   ALKPHOS 69 62 64  BILITOT  --   0.7 1.0  PROT  --  6.7 6.9  ALBUMIN 3.3* 3.6 3.7   No results for input(s): LIPASE, AMYLASE in the last 8760 hours. No results for input(s): AMMONIA in the last 8760 hours. CBC: Recent Labs    04/10/20 0000 05/22/20 0000 05/28/20 0831 05/29/20 0323 05/30/20 0407 05/31/20 0346 06/01/20 0357  WBC 10.8 7.7 19.9*   < > 22.7* 14.0* 12.5*  NEUTROABS 8,046.00 5,159.00 17.4*  --   --   --   --   HGB 14.6 12.8 13.6   < > 12.3 10.7* 10.4*  HCT 43 38 41.2   < > 37.3 33.3* 32.4*  MCV  --   --  105.1*   < > 107.5* 108.8* 108.7*  PLT 228 202 215   < > 186 179 197   < > = values in this interval not displayed.   Cardiac Enzymes: No results for input(s): CKTOTAL, CKMB, CKMBINDEX, TROPONINI in the last 8760 hours. BNP: Invalid input(s): POCBNP Lab Results  Component Value Date   HGBA1C 5.8 12/28/2018   Lab Results  Component Value Date   TSH 2.68 09/03/2019   Lab Results  Component Value Date   EPPIRJJO84 166 04/06/2018   No results found for: FOLATE No results found for: IRON, TIBC, FERRITIN  Imaging and Procedures obtained  prior to SNF admission: DG Chest 1 View  Result Date: 05/28/2020 CLINICAL DATA:  87 year old female status post fall with left femoral neck fracture. EXAM: CHEST  1 VIEW COMPARISON:  Portable chest 03/30/2019 and earlier. FINDINGS: Supine AP views at 0744 hours. Chest is somewhat overpenetrated. Chronic pulmonary hyperinflation. Stable cardiomegaly and mediastinal contours. Allowing for portable technique the lungs are clear. No pneumothorax. Visualized tracheal air column is within normal limits. Advanced degenerative changes at the thoracolumbar junction with partially visible lumbar fusion hardware. No acute osseous abnormality identified in the chest. IMPRESSION: Pulmonary hyperinflation.  No acute cardiopulmonary abnormality. Electronically Signed   By: Genevie Ann M.D.   On: 05/28/2020 07:57   CT Head Wo Contrast  Result Date: 05/28/2020 CLINICAL DATA:  Head  and neck trauma Unwitnessed fall Found on floor at nursing home Left forehead hematoma EXAM: CT HEAD WITHOUT CONTRAST CT CERVICAL SPINE WITHOUT CONTRAST TECHNIQUE: Multidetector CT imaging of the head and cervical spine was performed following the standard protocol without intravenous contrast. Multiplanar CT image reconstructions of the cervical spine were also generated. COMPARISON:  04/05/2020 FINDINGS: CT HEAD FINDINGS Brain: No evidence of acute infarction, hemorrhage, hydrocephalus, extra-axial collection or mass lesion/mass effect. Periventricular white matter hypodensity is a nonspecific finding, but most commonly relates to chronic ischemic small vessel disease. 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 Vascular: No hyperdense vessel or unexpected calcification. Skull: Normal. Negative for fracture or focal lesion. Sinuses/Orbits: No acute finding. Other: Large left frontal hematoma.  Small left posterior hematoma. CT CERVICAL SPINE FINDINGS Alignment: Grade 1 anterolisthesis of C3 on C4 and C4 on C5. Grade 1 retrolisthesis of C5 on C6. Grade 1 anterolisthesis of C7 on T1. Skull base and vertebrae: Anterior subluxation of the dens relative to the tip of the clivus unchanged from prior examination and likely due to ligamentous laxity. No acute fracture or dislocation. Soft tissues and spinal canal: No acute fracture or dislocation is identified. Prevertebral soft tissues are normal thickness. Disc levels: Advanced degenerative changes again seen throughout the cervical spine. Upper chest: Biapical pleuroparenchymal scarring. Other: None IMPRESSION: 1. Large left frontal scalp hematoma. No acute intracranial abnormality. 2. No acute fracture or dislocation of the cervical or visualized upper thoracic spine. Electronically Signed   By: Miachel Roux M.D.   On: 05/28/2020 08:22   CT Cervical Spine Wo Contrast  Result Date: 05/28/2020 CLINICAL DATA:  Head and neck trauma  Unwitnessed fall Found on floor at nursing home Left forehead hematoma EXAM: CT HEAD WITHOUT CONTRAST CT CERVICAL SPINE WITHOUT CONTRAST TECHNIQUE: Multidetector CT imaging of the head and cervical spine was performed following the standard protocol without intravenous contrast. Multiplanar CT image reconstructions of the cervical spine were also generated. COMPARISON:  04/05/2020 FINDINGS: CT HEAD FINDINGS Brain: No evidence of acute infarction, hemorrhage, hydrocephalus, extra-axial collection or mass lesion/mass effect. Periventricular white matter hypodensity is a nonspecific finding, but most commonly relates to chronic ischemic small vessel disease. 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 Vascular: No hyperdense vessel or unexpected calcification. Skull: Normal. Negative for fracture or focal lesion. Sinuses/Orbits: No acute finding. Other: Large left frontal hematoma.  Small left posterior hematoma. CT CERVICAL SPINE FINDINGS Alignment: Grade 1 anterolisthesis of C3 on C4 and C4 on C5. Grade 1 retrolisthesis of C5 on C6. Grade 1 anterolisthesis of C7 on T1. Skull base and vertebrae: Anterior subluxation of the dens relative to the tip of the clivus unchanged from prior examination and likely  due to ligamentous laxity. No acute fracture or dislocation. Soft tissues and spinal canal: No acute fracture or dislocation is identified. Prevertebral soft tissues are normal thickness. Disc levels: Advanced degenerative changes again seen throughout the cervical spine. Upper chest: Biapical pleuroparenchymal scarring. Other: None IMPRESSION: 1. Large left frontal scalp hematoma. No acute intracranial abnormality. 2. No acute fracture or dislocation of the cervical or visualized upper thoracic spine. Electronically Signed   By: Miachel Roux M.D.   On: 05/28/2020 08:22   Pelvis Portable  Result Date: 05/29/2020 CLINICAL DATA:  Postop EXAM: PORTABLE PELVIS 1-2 VIEWS COMPARISON:   04/05/2020, 05/28/2020 FINDINGS: Interval left hip hemiarthroplasty with intact hardware and normal alignment. Gas in the soft tissues consistent with recent surgery. Pubic symphysis and rami are intact IMPRESSION: Left hip replacement with expected surgical changes Electronically Signed   By: Donavan Foil M.D.   On: 05/29/2020 18:32   DG Knee Complete 4 Views Left  Result Date: 05/28/2020 CLINICAL DATA:  87 year old female status post fall with left femoral neck fracture. EXAM: LEFT KNEE - COMPLETE 4+ VIEW COMPARISON:  Left hip series today.  Left knee series 12/01/2016. FINDINGS: AP oblique and cross-table lateral views. Joint spaces and alignment remain normal for age. No acute osseous abnormality identified. No joint effusion. Bone mineralization is within normal limits for age. IMPRESSION: No acute fracture or dislocation identified about the left knee. Electronically Signed   By: Genevie Ann M.D.   On: 05/28/2020 10:47   DG C-Arm 1-60 Min-No Report  Result Date: 05/29/2020 Fluoroscopy was utilized by the requesting physician.  No radiographic interpretation.   DG HIP OPERATIVE UNILAT W OR W/O PELVIS LEFT  Result Date: 05/29/2020 CLINICAL DATA:  Hip surgery EXAM: OPERATIVE left HIP (WITH PELVIS IF PERFORMED) 2 VIEWS TECHNIQUE: Fluoroscopic spot image(s) were submitted for interpretation post-operatively. COMPARISON:  05/28/2020 FINDINGS: Two low resolution intraoperative spot views of the left hip. Total fluoroscopy time was 4 seconds. Images demonstrate a left hip hemiarthroplasty IMPRESSION: Intraoperative fluoroscopic assistance provided during left hip surgery Electronically Signed   By: Donavan Foil M.D.   On: 05/29/2020 18:31   DG Hip Unilat With Pelvis 2-3 Views Left  Result Date: 05/28/2020 CLINICAL DATA:  87 year old female status post fall with left hip pain. EXAM: DG HIP (WITH OR WITHOUT PELVIS) 2-3V LEFT COMPARISON:  Abdominal radiographs 11/14/2014. FINDINGS: AP and cross-table  lateral views demonstrate an acute left femoral neck fracture with impaction. Left femoral head remains normally located. Intertrochanteric segment remains intact. Grossly intact proximal right femur. No acute pelvic fracture identified. Chronic lumbar fusion hardware. Non obstructed visible bowel gas pattern. IMPRESSION: Acute, impacted left femoral neck fracture. Electronically Signed   By: Genevie Ann M.D.   On: 05/28/2020 07:55    Assessment/Plan Atrial fibrillation with RVR (HCC) Doing well on Cardizem No Anticoagulation due to falls, Bleeding and fraility  Vascular dementia without behavioral disturbance (HCC) Supportive treatment Has failed Remeron and Wellbutrin   Closed fracture of neck of left femur with routine healing, subsequent encounter Pain not controlled  Discontinue Norco and start her on Oxycodone to decrease Tylenol Load Also Start her on Robaxin 250 mg PRN WBAT Working with therapy Prognosis poor due to her Cognition and general decline  Leukocytosis, unspecified type Most Likely reactionary No Signs of Infection  Dysphagia, unspecified type Working with Speech On Regulat Puree diet Has lost weight  Neuropathy On Lyrica  Stage 3a chronic kidney disease (HCC) Creat stable Gastroesophageal reflux disease without esophagitis On Pepcid  Anxiety state Ativan Prn for now Has not tolerated any other Meds   Family/ staff Communication:   Labs/tests ordered:

## 2020-06-10 NOTE — Anesthesia Postprocedure Evaluation (Signed)
Anesthesia Post Note  Patient: Freight forwarder  Procedure(s) Performed: ANTERIOR APPROACH HEMI HIP ARTHROPLASTY (Left )     Patient location during evaluation: PACU Anesthesia Type: General Level of consciousness: sedated and patient cooperative Pain management: pain level controlled Vital Signs Assessment: post-procedure vital signs reviewed and stable Respiratory status: spontaneous breathing Cardiovascular status: stable Anesthetic complications: no   No complications documented.  Last Vitals:  Vitals:   06/01/20 0400 06/01/20 1012  BP: (!) 132/53 (!) 156/85  Pulse: 90   Resp: 16   Temp: 37 C   SpO2: 96% 98%    Last Pain:  Vitals:   06/01/20 1042  TempSrc:   PainSc: [place]

## 2020-06-11 ENCOUNTER — Non-Acute Institutional Stay (SKILLED_NURSING_FACILITY): Payer: Medicare Other | Admitting: Nurse Practitioner

## 2020-06-11 ENCOUNTER — Non-Acute Institutional Stay: Payer: Self-pay

## 2020-06-11 ENCOUNTER — Encounter: Payer: Self-pay | Admitting: Nurse Practitioner

## 2020-06-11 DIAGNOSIS — K5900 Constipation, unspecified: Secondary | ICD-10-CM | POA: Diagnosis not present

## 2020-06-11 DIAGNOSIS — I4891 Unspecified atrial fibrillation: Secondary | ICD-10-CM

## 2020-06-11 DIAGNOSIS — H53133 Sudden visual loss, bilateral: Secondary | ICD-10-CM

## 2020-06-11 DIAGNOSIS — G629 Polyneuropathy, unspecified: Secondary | ICD-10-CM

## 2020-06-11 DIAGNOSIS — E1322 Other specified diabetes mellitus with diabetic chronic kidney disease: Secondary | ICD-10-CM | POA: Diagnosis not present

## 2020-06-11 DIAGNOSIS — I6522 Occlusion and stenosis of left carotid artery: Secondary | ICD-10-CM

## 2020-06-11 DIAGNOSIS — R490 Dysphonia: Secondary | ICD-10-CM

## 2020-06-11 DIAGNOSIS — D638 Anemia in other chronic diseases classified elsewhere: Secondary | ICD-10-CM | POA: Insufficient documentation

## 2020-06-11 DIAGNOSIS — F0150 Vascular dementia without behavioral disturbance: Secondary | ICD-10-CM

## 2020-06-11 DIAGNOSIS — R1310 Dysphagia, unspecified: Secondary | ICD-10-CM | POA: Diagnosis not present

## 2020-06-11 DIAGNOSIS — E43 Unspecified severe protein-calorie malnutrition: Secondary | ICD-10-CM | POA: Diagnosis not present

## 2020-06-11 DIAGNOSIS — S0003XD Contusion of scalp, subsequent encounter: Secondary | ICD-10-CM

## 2020-06-11 DIAGNOSIS — S7290XD Unspecified fracture of unspecified femur, subsequent encounter for closed fracture with routine healing: Secondary | ICD-10-CM | POA: Diagnosis not present

## 2020-06-11 DIAGNOSIS — N183 Chronic kidney disease, stage 3 unspecified: Secondary | ICD-10-CM

## 2020-06-11 DIAGNOSIS — D72829 Elevated white blood cell count, unspecified: Secondary | ICD-10-CM | POA: Diagnosis not present

## 2020-06-11 DIAGNOSIS — I6529 Occlusion and stenosis of unspecified carotid artery: Secondary | ICD-10-CM | POA: Diagnosis not present

## 2020-06-11 NOTE — Assessment & Plan Note (Signed)
Left forehead, slow healing, yellow drainage seen, will apply Bactroban oint bid x 10 days.

## 2020-06-11 NOTE — Assessment & Plan Note (Signed)
2016 US carotid artery:Left totally occluded. Right 40% occlusion. 2/4/22Carotid R+L artery US(last done 2014)showed no significnt stenosis of the internal carotid artery.

## 2020-06-11 NOTE — Progress Notes (Signed)
Location:   [facility] Room Number: N05 Place of Service:  SNF (31) Provider:  Hend Mccarrell Otho Darner, NP    Patient Care Team: [name], MD as PCP - General (Internal Medicine) Angelia Mould, MD as Attending Physician (Vascular Surgery) [name], MD (Inactive) as Attending Physician (Obstetrics and Gynecology) [name], MD (Orthopedic Surgery) [name], MD (Ophthalmology) [name], MD as Consulting Physician (Urology) [name], MD as Consulting Physician (Cardiology) [name], PA-C as Physician Assistant (Otolaryngology) [name], MD as Consulting Physician (Otolaryngology) [name], MD as Consulting Physician (Dermatology) [name], MD as Consulting Physician (Physical Medicine and Rehabilitation) [name], MD as Attending Physician (Gastroenterology) Mac Dowdell X, NP as Nurse Practitioner (Internal Medicine) Kathrynn Ducking, MD as Consulting Physician (Neurology) Debara Pickett Nadean Corwin, MD as Consulting Physician (Cardiology) Druscilla Brownie, MD as Consulting Physician (Dermatology) [name], [name], NP as Nurse Practitioner (Family Medicine)  Extended Emergency Contact Information Primary Emergency Contact: [name] of [place] Phone: [phone] Mobile Phone: [phone] Relation: Son Secondary Emergency Contact: Serita G[place] of [place] Phone: [phone] Mobile Phone: [phone] Relation: Other  Code Status:DNR   Goals of care: Advanced Directive information Advanced Directives 06/11/2020  Does Patient Have a Medical Advance Directive? Yes  Type of Paramedic of [place];Out of facility DNR (pink MOST or yellow form)  Does patient want to make changes to medical advance directive? No - Patient declined  Copy of [place] in Chart? Yes - validated most recent copy scanned in chart (See row  information)  Would patient like information on creating a medical advance directive? -  Pre-existing out of facility DNR order (yellow form or pink MOST form) Yellow form placed in chart (order not valid for inpatient use);Pink MOST form placed in chart (order not valid for inpatient use)     Chief Complaint  Patient presents with  . Medical Management of Chronic Issues    Routine follow up  . Health Maintenance    Discuss need for foot exam, ophthalmology exam, urine microalbumin, shingles vaccine, and hemoglobin a1c.     HPI:  Pt is a 87 y.o. female seen today for medical management of chronic diseases.    Loss, vision, sudden, bilateralresolved after a few minutes w/o intervention, TIA vs MD, saw ophthalmology 05/19/20 Carotid stenosis 2016 US carotid artery:Left totally occluded. Right 40% occlusion. 2/4/22Carotid R+L artery US(last done 2014)showed no significnt stenosis of the internal carotid artery. Vascular dementia ([place]) CT head 02/10/20 chronic small vessel ischemic disease, no acute intracranial process. Prn Lorazepam available to her.   Anemia, stable, Hgb 10.4 06/01/20 Neuropathy,takesLyrica Dysphagia,s/p dilatation food impaction, f/u GI, takes Famotidine 20mg  qd Atrial fibrillation with RVR (HCC),takes Atenolol, Diltiazem, ASA 81mg  qd.EKG 02/11/20 Afib Constipation,takes IB Gard, MiraLax prn, Senokot S II qd, MOM Dysphonia,chronic, ENT Osteoarthritis,takes Tylenol, Oxycodone, Methocarbamol CKD (chronic kidney disease) stage 3, GFR 30-59 ml/min (HCC),Bun/creat 40/0.91 05/31/20    Past Medical History:  Diagnosis Date  . Abdominal bloating   . Atrial fibrillation ([place])   . Bruises easily   . Carotid artery occlusion    LEFT  . Cricopharyngeal dysphagia   . Dizziness   . DOE (dyspnea on exertion) 04/02/2014  . Fall at home Sept 2013, Dec. 2013   Jun 08, 2012  . GERD (gastroesophageal reflux disease) 02/11/2015  . Headache(784.0)   . Hoarseness   . Hypercholesterolemia   . Hyperglycemia 04/02/2014   Glucose  178 mg percent on 01/22/2014 04/05/14 Hgb A1c 6.6 Diet controlled.    . Hypertensive retinopathy    OU  . Hypothyroidism 04/15/2015  . Macular degeneration    Dry OU  . Neuropathy    PERIPHERAL  . Pruritus   . Scoliosis   . Varicose veins   . Zenker's diverticulum    Past Surgical History:  Procedure Laterality Date  . ABDOMINAL HYSTERECTOMY  1954  . ANTERIOR APPROACH HEMI HIP ARTHROPLASTY Left 05/29/2020   Procedure: ANTERIOR APPROACH HEMI HIP ARTHROPLASTY;  Surgeon: Rod Can, MD;  Location: [facility];  Service: Orthopedics;  Laterality: Left;  . CATARACT EXTRACTION Bilateral   . CHOLECYSTECTOMY  1997  . ESOPHAGOGASTRODUODENOSCOPY N/A 03/30/2019   Procedure: ESOPHAGOGASTRODUODENOSCOPY (EGD);  Surgeon: Jerene Bears, MD;  Location: [facility];  Service: Gastroenterology;  Laterality: N/A;  . ESOPHAGOGASTRODUODENOSCOPY (EGD) WITH PROPOFOL N/A 11/25/2016   Procedure: ESOPHAGOGASTRODUODENOSCOPY (EGD) WITH PROPOFOL;  Surgeon: [name], MD;  Location: [facility];  Service: Endoscopy;  Laterality: N/A;  . EYE SURGERY Bilateral    Cat Sx  . SPINE SURGERY  1997   correct scoliosis    No Known Allergies  Allergies as of 06/11/2020   No Known Allergies     Medication List       Accurate as of June 11, 2020 12:07 PM. If you have any questions, ask your nurse or doctor.        (feeding supplement) PROSource Plus liquid Take 30 mLs by mouth daily.   acetaminophen 500 MG tablet Commonly known as: TYLENOL Take 500 mg by mouth 2 (two) times daily.   acetaminophen 325 MG tablet Commonly known as: TYLENOL Take 650 mg by mouth every 12 (twelve) hours as needed for mild pain.   aspirin 81 MG chewable tablet Chew 81 mg by mouth daily.   diltiazem 30 MG tablet Commonly known as: CARDIZEM Take 1 tablet (30  mg total) by mouth every 12 (twelve) hours.   famotidine 20 MG tablet Commonly known as: PEPCID Take 20 mg by mouth daily.   fluticasone 50 MCG/ACT nasal spray Commonly known as: FLONASE Place 2 sprays into both nostrils daily.   IBgard 90 MG Cpcr Generic drug: Peppermint Oil Take 180 mg by mouth 2 (two) times daily.   loratadine 10 MG tablet Commonly known as: CLARITIN Take 10 mg by mouth daily as needed for allergies.   LORazepam 0.5 MG tablet Commonly known as: ATIVAN Take 1 tablet (0.5 mg total) by mouth 2 (two) times daily as needed for anxiety (agitation).   Menthol (Topical Analgesic) 4 % Gel Apply 1 application topically every 8 (eight) hours as needed (pain).   methocarbamol 500 MG tablet Commonly known as: ROBAXIN Take 250 mg by mouth every 6 (six) hours as needed for muscle spasms.   Milk of Magnesia 400 MG/5ML suspension Generic drug: magnesium hydroxide Take 5 mLs by mouth daily as needed for mild constipation.   NON FORMULARY Med Pass liquid; 2.0; amt: 180 ML; oral Special Instructions: Med pass 2.0 180ML. Doc- % consumed. Three Times A Day   oxyCODONE 5 MG immediate release tablet Commonly known as: Oxy IR/ROXICODONE Take 1 tablet (5 mg total) by mouth every 6 (six) hours as needed for severe pain.   polyethylene glycol 17 g packet Commonly known as: MIRALAX / GLYCOLAX Take 8.5-17 g by mouth See admin instructions. 8.5g in the morning scheduled,  And 17g daily as needed for constipation   pregabalin 25 MG capsule Commonly known  as: LYRICA Take 1 capsule (25 mg total) by mouth 2 (two) times daily.   sennosides-docusate sodium 8.6-50 MG tablet Commonly known as: SENOKOT-S Take 2 tablets by mouth daily.   sodium fluoride 1.1 % Crea dental cream Commonly known as: PREVIDENT 5000 PLUS Place 1 application onto teeth every evening.   sodium phosphate 7-19 GM/118ML Enem Place 1 enema rectally daily as needed for severe constipation. Give Fleet enema  per rectum X 1 after removing hard stool or if soft stool is present in rectum.   Systane Balance 0.6 % Soln Generic drug: Propylene Glycol Give 1 drop to both eyes once daily as needed for dryness   THERA MULTI-VITAMIN PO Take 1 tablet by mouth daily.   Vitamin B-2 25 MG Tabs Take 25 mg by mouth daily.   ZINC OXIDE (TOPICAL) 25 % Oint Apply 1 application topically 3 (three) times daily as needed.       Review of Systems  Constitutional: Positive for fatigue. Negative for appetite change and fever.  HENT: Positive for hearing loss and trouble swallowing. Negative for congestion, sore throat and voice change.   Eyes: Positive for visual disturbance. Negative for pain.       Minimal vision of the right eye  Respiratory: Negative for cough and shortness of breath.   Cardiovascular: Negative for leg swelling.  Gastrointestinal: Negative for abdominal pain and constipation.  Genitourinary: Negative for dysuria, hematuria and urgency.       New onset urinary incontinence.   Musculoskeletal: Positive for arthralgias, back pain and gait problem.  Skin: Positive for wound. Negative for color change.  Neurological: Negative for speech difficulty, weakness and light-headedness.  Psychiatric/Behavioral: Negative for behavioral problems and sleep disturbance. The patient is not nervous/anxious.     Immunization History  Administered Date(s) Administered  . Influenza Split 10/20/2011, 11/03/2012  . Influenza, High Dose Seasonal PF 10/24/2018  . Influenza,inj,Quad PF,6+ Mos 09/29/2012, 11/09/2013  . Influenza-Unspecified 10/17/2014, 10/23/2015, 10/20/2016, 10/17/2017, 10/16/2019  . Moderna Sars-Covid-2 Vaccination 01/15/2019, 02/12/2019, 11/26/2019  . PPD Test 08/21/2013, 03/04/2014  . Pneumococcal Conjugate-13 08/21/2013  . Pneumococcal Polysaccharide-23 09/08/2016  . Tdap 05/20/2008  . Tetanus 11/02/2018  . Zoster, Live 11/01/2012   Pertinent  Health Maintenance Due  Topic Date  Due  . FOOT EXAM  Never done  . OPHTHALMOLOGY EXAM  Never done  . URINE MICROALBUMIN  Never done  . HEMOGLOBIN A1C  06/28/2019  . INFLUENZA VACCINE  08/11/2020  . DEXA SCAN  Completed  . PNA vac Low Risk Adult  Completed   Fall Risk  11/04/2017 09/05/2017 09/22/2016 08/25/2016 05/31/2016  Falls in the past year? Yes Yes Yes Yes No  Number falls in past yr: 2 or more 2 or more 1 2 or more -  Comment - - - - -  Injury with Fall? Yes Yes Yes Yes -  Comment - - fx 3 ribs - -  Risk Factor Category  - - - - -  Risk for fall due to : - - Impaired mobility;Impaired balance/gait - -  Risk for fall due to: Comment - - - - -   Functional Status Survey:    Vitals:   06/11/20 1044  BP: 116/70  Pulse: 81  Resp: 18  Temp: 98.8 F (37.1 C)  SpO2: 92%  Weight: 80 lb (36.3 kg)  Height: 5\' 2"  (1.575 m)   Body mass index is 14.63 kg/m. Physical Exam Vitals and nursing note reviewed.  Constitutional:      Appearance:  Normal appearance.  HENT:     Head: Normocephalic and atraumatic.     Mouth/Throat:     Mouth: Mucous membranes are moist.  Eyes:     General: Vision grossly intact. Gaze aligned appropriately. No allergic shiner or visual field deficit.       Right eye: No foreign body or hordeolum.        Left eye: No foreign body or hordeolum.     Extraocular Movements: Extraocular movements intact.     Conjunctiva/sclera: Conjunctivae normal.     Right eye: Right conjunctiva is not injected. No exudate or hemorrhage.    Left eye: Left conjunctiva is not injected. No exudate or hemorrhage.    Pupils: Pupils are equal, round, and reactive to light.  Cardiovascular:     Rate and Rhythm: Normal rate. Rhythm irregular.     Heart sounds: No murmur heard.   Pulmonary:     Effort: Pulmonary effort is normal.     Breath sounds: No rales.  Abdominal:     General: Bowel sounds are normal.     Palpations: Abdomen is soft.     Tenderness: There is no abdominal tenderness.  Musculoskeletal:      Cervical back: Normal range of motion and neck supple.     Right lower leg: No edema.     Left lower leg: No edema.  Skin:    General: Skin is warm and dry.     Comments: Hematomas left forehead scalp open wound, yellow drainage seen.   Neurological:     General: No focal deficit present.     Mental Status: She is alert. Mental status is at baseline.     Gait: Gait abnormal.     Comments: Oriented to person, place.   Psychiatric:        Mood and Affect: Mood normal.        Behavior: Behavior normal.        Thought Content: Thought content normal.     Labs reviewed: Recent Labs    05/29/20 0323 05/30/20 0407 05/31/20 0346  NA 140 139 139  K 4.6 4.5 4.4  CL 104 105 103  CO2 29 27 28   GLUCOSE 141* 168* 143*  BUN 25* 33* 40*  CREATININE 0.87 1.04* 0.91  CALCIUM 9.4 8.6* 8.8*   Recent Labs    01/21/20 0000 02/10/20 1330 05/28/20 0831  AST 20 27 43*  ALT 12 19 18   ALKPHOS 69 62 64  BILITOT  --  0.7 1.0  PROT  --  6.7 6.9  ALBUMIN 3.3* 3.6 3.7   Recent Labs    04/10/20 0000 05/22/20 0000 05/28/20 0831 05/29/20 0323 05/30/20 0407 05/31/20 0346 06/01/20 0357  WBC 10.8 7.7 19.9*   < > 22.7* 14.0* 12.5*  NEUTROABS 8,046.00 5,159.00 17.4*  --   --   --   --   HGB 14.6 12.8 13.6   < > 12.3 10.7* 10.4*  HCT 43 38 41.2   < > 37.3 33.3* 32.4*  MCV  --   --  105.1*   < > 107.5* 108.8* 108.7*  PLT 228 202 215   < > 186 179 197   < > = values in this interval not displayed.   Lab Results  Component Value Date   TSH 2.68 09/03/2019   Lab Results  Component Value Date   HGBA1C 5.8 12/28/2018   Lab Results  Component Value Date   CHOL 187 11/03/2017   HDL 84 (A)  11/03/2017   [place] 80 11/03/2017   TRIG 136 11/03/2017   CHOLHDL 2.1 07/29/2016    Significant Diagnostic Results in last 30 days:  DG Chest 1 View  Result Date: 05/28/2020 CLINICAL DATA:  87 year old female status post fall with left femoral neck fracture. EXAM: CHEST  1 VIEW COMPARISON:   Portable chest 03/30/2019 and earlier. FINDINGS: Supine AP views at 0744 hours. Chest is somewhat overpenetrated. Chronic pulmonary hyperinflation. Stable cardiomegaly and mediastinal contours. Allowing for portable technique the lungs are clear. No pneumothorax. Visualized tracheal air column is within normal limits. Advanced degenerative changes at the thoracolumbar junction with partially visible lumbar fusion hardware. No acute osseous abnormality identified in the chest. IMPRESSION: Pulmonary hyperinflation.  No acute cardiopulmonary abnormality. Electronically Signed   By: Genevie Ann M.D.   On: 05/28/2020 07:57   CT Head Wo Contrast  Result Date: 05/28/2020 CLINICAL DATA:  Head and neck trauma Unwitnessed fall Found on floor at nursing home Left forehead hematoma EXAM: CT HEAD WITHOUT CONTRAST CT CERVICAL SPINE WITHOUT CONTRAST TECHNIQUE: Multidetector CT imaging of the head and cervical spine was performed following the standard protocol without intravenous contrast. Multiplanar CT image reconstructions of the cervical spine were also generated. COMPARISON:  04/05/2020 FINDINGS: CT HEAD FINDINGS Brain: No evidence of acute infarction, hemorrhage, hydrocephalus, extra-axial collection or mass lesion/mass effect. Periventricular white matter hypodensity is a nonspecific finding, but most commonly relates to chronic ischemic small vessel disease. 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 Vascular: No hyperdense vessel or unexpected calcification. Skull: Normal. Negative for fracture or focal lesion. Sinuses/Orbits: No acute finding. Other: Large left frontal hematoma.  Small left posterior hematoma. CT CERVICAL SPINE FINDINGS Alignment: Grade 1 anterolisthesis of C3 on C4 and C4 on C5. Grade 1 retrolisthesis of C5 on C6. Grade 1 anterolisthesis of C7 on T1. Skull base and vertebrae: Anterior subluxation of the dens relative to the tip of the clivus unchanged from prior  examination and likely due to ligamentous laxity. No acute fracture or dislocation. Soft tissues and spinal canal: No acute fracture or dislocation is identified. Prevertebral soft tissues are normal thickness. Disc levels: Advanced degenerative changes again seen throughout the cervical spine. Upper chest: Biapical pleuroparenchymal scarring. Other: None IMPRESSION: 1. Large left frontal scalp hematoma. No acute intracranial abnormality. 2. No acute fracture or dislocation of the cervical or visualized upper thoracic spine. Electronically Signed   By: Miachel Roux M.D.   On: 05/28/2020 08:22   CT Cervical Spine Wo Contrast  Result Date: 05/28/2020 CLINICAL DATA:  Head and neck trauma Unwitnessed fall Found on floor at nursing home Left forehead hematoma EXAM: CT HEAD WITHOUT CONTRAST CT CERVICAL SPINE WITHOUT CONTRAST TECHNIQUE: Multidetector CT imaging of the head and cervical spine was performed following the standard protocol without intravenous contrast. Multiplanar CT image reconstructions of the cervical spine were also generated. COMPARISON:  04/05/2020 FINDINGS: CT HEAD FINDINGS Brain: No evidence of acute infarction, hemorrhage, hydrocephalus, extra-axial collection or mass lesion/mass effect. Periventricular white matter hypodensity is a nonspecific finding, but most commonly relates to chronic ischemic small vessel disease. 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 Vascular: No hyperdense vessel or unexpected calcification. Skull: Normal. Negative for fracture or focal lesion. Sinuses/Orbits: No acute finding. Other: Large left frontal hematoma.  Small left posterior hematoma. CT CERVICAL SPINE FINDINGS Alignment: Grade 1 anterolisthesis of C3 on C4 and C4 on C5. Grade 1 retrolisthesis of C5 on C6. Grade 1 anterolisthesis of C7 on  T1. Skull base and vertebrae: Anterior subluxation of the dens relative to the tip of the clivus unchanged from prior examination and  likely due to ligamentous laxity. No acute fracture or dislocation. Soft tissues and spinal canal: No acute fracture or dislocation is identified. Prevertebral soft tissues are normal thickness. Disc levels: Advanced degenerative changes again seen throughout the cervical spine. Upper chest: Biapical pleuroparenchymal scarring. Other: None IMPRESSION: 1. Large left frontal scalp hematoma. No acute intracranial abnormality. 2. No acute fracture or dislocation of the cervical or visualized upper thoracic spine. Electronically Signed   By: Miachel Roux M.D.   On: 05/28/2020 08:22   Pelvis Portable  Result Date: 05/29/2020 CLINICAL DATA:  Postop EXAM: PORTABLE PELVIS 1-2 VIEWS COMPARISON:  04/05/2020, 05/28/2020 FINDINGS: Interval left hip hemiarthroplasty with intact hardware and normal alignment. Gas in the soft tissues consistent with recent surgery. Pubic symphysis and rami are intact IMPRESSION: Left hip replacement with expected surgical changes Electronically Signed   By: Donavan Foil M.D.   On: 05/29/2020 18:32   DG Knee Complete 4 Views Left  Result Date: 05/28/2020 CLINICAL DATA:  87 year old female status post fall with left femoral neck fracture. EXAM: LEFT KNEE - COMPLETE 4+ VIEW COMPARISON:  Left hip series today.  Left knee series 12/01/2016. FINDINGS: AP oblique and cross-table lateral views. Joint spaces and alignment remain normal for age. No acute osseous abnormality identified. No joint effusion. Bone mineralization is within normal limits for age. IMPRESSION: No acute fracture or dislocation identified about the left knee. Electronically Signed   By: Genevie Ann M.D.   On: 05/28/2020 10:47   DG C-Arm 1-60 Min-No Report  Result Date: 05/29/2020 Fluoroscopy was utilized by the requesting physician.  No radiographic interpretation.   OCT, Retina - OU - Both Eyes  Result Date: 05/16/2020 Right Eye Quality was good. Central Foveal Thickness: 165. Progression has improved. Findings include  retinal drusen , inner retinal atrophy, intraretinal hyper-reflective material, abnormal foveal contour, outer retinal atrophy, no SRF, no IRF (Interval improvement in massive central CME -- resolved). Left Eye Quality was good. Central Foveal Thickness: 223. Progression has been stable. Findings include normal foveal contour, no SRF, no IRF, retinal drusen , outer retinal atrophy (Mild patchy ORA; no significant change from prior). Notes *Images captured and stored on drive Diagnosis / Impression: non-exu ARMD OU OD: CRVO; Interval improvement in massive central CME -- resolved OS: NFP; no IRF/SRF; patchy ORA - stable Clinical management: See below Abbreviations: NFP - Normal foveal profile. CME - cystoid macular edema. PED - pigment epithelial detachment. IRF - intraretinal fluid. SRF - subretinal fluid. EZ - ellipsoid zone. ERM - epiretinal membrane. ORA - outer retinal atrophy. ORT - outer retinal tubulation. SRHM - subretinal hyper-reflective material   DG HIP OPERATIVE UNILAT W OR W/O PELVIS LEFT  Result Date: 05/29/2020 CLINICAL DATA:  Hip surgery EXAM: OPERATIVE left HIP (WITH PELVIS IF PERFORMED) 2 VIEWS TECHNIQUE: Fluoroscopic spot image(s) were submitted for interpretation post-operatively. COMPARISON:  05/28/2020 FINDINGS: Two low resolution intraoperative spot views of the left hip. Total fluoroscopy time was 4 seconds. Images demonstrate a left hip hemiarthroplasty IMPRESSION: Intraoperative fluoroscopic assistance provided during left hip surgery Electronically Signed   By: Donavan Foil M.D.   On: 05/29/2020 18:31   DG Hip Unilat With Pelvis 2-3 Views Left  Result Date: 05/28/2020 CLINICAL DATA:  87 year old female status post fall with left hip pain. EXAM: DG HIP (WITH OR WITHOUT PELVIS) 2-3V LEFT COMPARISON:  Abdominal radiographs 11/14/2014. FINDINGS:  AP and cross-table lateral views demonstrate an acute left femoral neck fracture with impaction. Left femoral head remains normally located.  Intertrochanteric segment remains intact. Grossly intact proximal right femur. No acute pelvic fracture identified. Chronic lumbar fusion hardware. Non obstructed visible bowel gas pattern. IMPRESSION: Acute, impacted left femoral neck fracture. Electronically Signed   By: Genevie Ann M.D.   On: 05/28/2020 07:55    Assessment/Plan Loss, vision, sudden, bilateral Loss, vision, sudden, bilateralresolved after a few minutes w/o intervention, TIA vs MD, saw ophthalmology 05/19/20   Carotid stenosis 2016 US carotid artery:Left totally occluded. Right 40% occlusion. 2/4/22Carotid R+L artery US(last done 2014)showed no significnt stenosis of the internal carotid artery.   Vascular dementia ([place]) CT head 02/10/20 chronic small vessel ischemic disease, no acute intracranial process. Prn Lorazepam available to her.    Anemia, chronic disease Hgb 10.4 06/01/20  Neuropathy Lower back pain, takes Lyrica, Oxycodone, Methocarbamol, Tylenl.   Dysphagia s/p dilatation food impaction, f/u GI, takes Famotidine 20mg  qd   Atrial fibrillation with RVR (HCC) Heart rate is in control, takes Atenolol, Diltiazem, ASA 81mg  qd.EKG 02/11/20 Afib   Constipation takes IB Gard, MiraLax prn, Senokot S II qd, MOM   Scalp hematoma, subsequent encounter Left forehead, slow healing, yellow drainage seen, will apply Bactroban oint bid x 10 days.   Dysphonia Long standing issue, underwent ENT evaluation in the past.   CKD (chronic kidney disease) stage 3, GFR 30-59 ml/min (HCC) Bun/creat 40/0.91 05/31/20     Family/ staff Communication: plan of care reviewed with the patient and charge nurse.   Labs/tests ordered:  none  Time spend 35 minutes.

## 2020-06-11 NOTE — Assessment & Plan Note (Signed)
takes IB Gard, MiraLax prn, Senokot S II qd, MOM

## 2020-06-11 NOTE — Progress Notes (Signed)
Location:   [place] Room Number: N05 Place of Service:  SNF (31) Provider:  Dyon Rotert Otho Darner, NP    Patient Care Team: [name], MD as PCP - General (Internal Medicine) Angelia Mould, MD as Attending Physician (Vascular Surgery) [name], MD (Inactive) as Attending Physician (Obstetrics and Gynecology) [name], MD (Orthopedic Surgery) [name], MD (Ophthalmology) [name], MD as Consulting Physician (Urology) [name], MD as Consulting Physician (Cardiology) [name], PA-C as Physician Assistant (Otolaryngology) [name], MD as Consulting Physician (Otolaryngology) [name], MD as Consulting Physician (Dermatology) [name], MD as Consulting Physician (Physical Medicine and Rehabilitation) [name], MD as Attending Physician (Gastroenterology) Deral Schellenberg X, NP as Nurse Practitioner (Internal Medicine) Kathrynn Ducking, MD as Consulting Physician (Neurology) Debara Pickett Nadean Corwin, MD as Consulting Physician (Cardiology) Druscilla Brownie, MD as Consulting Physician (Dermatology) [name], [name], NP as Nurse Practitioner (Family Medicine)  Extended Emergency Contact Information Primary Emergency Contact: [name] of [place] Phone: [phone] Mobile Phone: [phone] Relation: Son Secondary Emergency Contact: Serita G[place] of [place] Phone: [phone] Mobile Phone: [phone] Relation: Other  Code Status:DNR   Goals of care: Advanced Directive information Advanced Directives 06/12/2020  Does Patient Have a Medical Advance Directive? Yes  Type of Advance Directive [place]  Does patient want to make changes to medical advance directive? No - Patient declined  Copy of Point in Chart? Yes - validated most recent copy scanned in chart (See row information)  Would patient like information on  creating a medical advance directive? -  Pre-existing out of facility DNR order (yellow form or pink MOST form) Yellow form placed in chart (order not valid for inpatient use);Pink MOST form placed in chart (order not valid for inpatient use)     Chief Complaint  Patient presents with  . Medical Management of Chronic Issues    Routine follow up   . Health Maintenance    Discuss need for foot exam, eye exam, urine mirco, and shingles vaccine.    HPI:  Pt is a 87 y.o. female seen today for medical management of chronic diseases.    Loss, vision, sudden, bilateralresolved after a few minutes w/o intervention, TIA vs MD, saw ophthalmology 05/19/20 Carotid stenosis 2016 US carotid artery:Left totally occluded. Right 40% occlusion. 2/4/22Carotid R+L artery US(last done 2014)showed no significnt stenosis of the internal carotid artery. Vascular dementia ([place]) CT head 02/10/20 chronic small vessel ischemic disease, no acute intracranial process. Prn Lorazepam available to her.   Anemia, stable, Hgb 10.4 06/01/20 Neuropathy,takesLyrica Dysphagia,s/p dilatation food impaction, f/u GI, takes Famotidine 20mg  qd Atrial fibrillation with RVR (HCC),takes Atenolol, Diltiazem, ASA 81mg  qd.EKG 02/11/20 Afib Constipation,takes IB Gard, MiraLax prn, Senokot S II qd, MOM Dysphonia,chronic, ENT Osteoarthritis,takes Tylenol, Oxycodone, Methocarbamol CKD (chronic kidney disease) stage 3, GFR 30-59 ml/min (HCC),Bun/creat 40/0.91 05/31/20    Past Medical History:  Diagnosis Date  . Abdominal bloating   . Atrial fibrillation ([place])   . Bruises easily   . Carotid artery occlusion    LEFT  . Cricopharyngeal dysphagia   . Dizziness   . DOE (dyspnea on exertion) 04/02/2014  . Fall at home Sept 2013, Dec. 2013  Jun 08, 2012  . GERD (gastroesophageal reflux disease) 02/11/2015  .  Headache(784.0)   . Hoarseness   . Hypercholesterolemia   . Hyperglycemia 04/02/2014   Glucose 178 mg percent on 01/22/2014 04/05/14 Hgb A1c 6.6 Diet  controlled.    . Hypertensive retinopathy    OU  . Hypothyroidism 04/15/2015  . Macular degeneration    Dry OU  . Neuropathy    PERIPHERAL  . Pruritus   . Scoliosis   . Varicose veins   . Zenker's diverticulum    Past Surgical History:  Procedure Laterality Date  . ABDOMINAL HYSTERECTOMY  1954  . ANTERIOR APPROACH HEMI HIP ARTHROPLASTY Left 05/29/2020   Procedure: ANTERIOR APPROACH HEMI HIP ARTHROPLASTY;  Surgeon: Rod Can, MD;  Location: [facility];  Service: Orthopedics;  Laterality: Left;  . CATARACT EXTRACTION Bilateral   . CHOLECYSTECTOMY  1997  . ESOPHAGOGASTRODUODENOSCOPY N/A 03/30/2019   Procedure: ESOPHAGOGASTRODUODENOSCOPY (EGD);  Surgeon: Jerene Bears, MD;  Location: [facility];  Service: Gastroenterology;  Laterality: N/A;  . ESOPHAGOGASTRODUODENOSCOPY (EGD) WITH PROPOFOL N/A 11/25/2016   Procedure: ESOPHAGOGASTRODUODENOSCOPY (EGD) WITH PROPOFOL;  Surgeon: [name], MD;  Location: [facility];  Service: Endoscopy;  Laterality: N/A;  . EYE SURGERY Bilateral    Cat Sx  . SPINE SURGERY  1997   correct scoliosis    No Known Allergies  Allergies as of 06/11/2020   No Known Allergies     Medication List       Accurate as of June 11, 2020 11:59 PM. If you have any questions, ask your nurse or doctor.        (feeding supplement) PROSource Plus liquid Take 30 mLs by mouth daily.   acetaminophen 500 MG tablet Commonly known as: TYLENOL Take 500 mg by mouth 2 (two) times daily.   acetaminophen 325 MG tablet Commonly known as: TYLENOL Take 650 mg by mouth every 12 (twelve) hours as needed for mild pain.   aspirin 81 MG chewable tablet Chew 81 mg by mouth daily.   diltiazem 30 MG tablet Commonly known as: CARDIZEM Take 1 tablet (30 mg total) by mouth every 12 (twelve) hours.   famotidine 20 MG  tablet Commonly known as: PEPCID Take 20 mg by mouth daily.   fluticasone 50 MCG/ACT nasal spray Commonly known as: FLONASE Place 2 sprays into both nostrils daily.   IBgard 90 MG Cpcr Generic drug: Peppermint Oil Take 180 mg by mouth 2 (two) times daily.   loratadine 10 MG tablet Commonly known as: CLARITIN Take 10 mg by mouth daily as needed for allergies.   LORazepam 0.5 MG tablet Commonly known as: ATIVAN Take 1 tablet (0.5 mg total) by mouth 2 (two) times daily as needed for anxiety (agitation).   Menthol (Topical Analgesic) 4 % Gel Apply 1 application topically every 8 (eight) hours as needed (pain).   methocarbamol 500 MG tablet Commonly known as: ROBAXIN Take 250 mg by mouth every 6 (six) hours as needed for muscle spasms.   Milk of Magnesia 400 MG/5ML suspension Generic drug: magnesium hydroxide Take 5 mLs by mouth daily as needed for mild constipation.   NON FORMULARY Med Pass liquid; 2.0; amt: 180 ML; oral Special Instructions: Med pass 2.0 180ML. Doc- % consumed. Three Times A Day   oxyCODONE 5 MG immediate release tablet Commonly known as: Oxy IR/ROXICODONE Take 1 tablet (5 mg total) by mouth every 6 (six) hours as needed for severe pain.   polyethylene glycol 17 g packet Commonly known as: MIRALAX / GLYCOLAX Take 8.5-17 g by mouth See admin instructions. 8.5g in the morning scheduled,  And 17g daily as needed for constipation   pregabalin 25 MG capsule Commonly known as: LYRICA Take 1 capsule (25 mg total) by mouth  2 (two) times daily.   sennosides-docusate sodium 8.6-50 MG tablet Commonly known as: SENOKOT-S Take 2 tablets by mouth daily.   sodium fluoride 1.1 % Crea dental cream Commonly known as: PREVIDENT 5000 PLUS Place 1 application onto teeth every evening.   sodium phosphate 7-19 GM/118ML Enem Place 1 enema rectally daily as needed for severe constipation. Give Fleet enema per rectum X 1 after removing hard stool or if soft stool is  present in rectum.   Systane Balance 0.6 % Soln Generic drug: Propylene Glycol Give 1 drop to both eyes once daily as needed for dryness   THERA MULTI-VITAMIN PO Take 1 tablet by mouth daily.   Vitamin B-2 25 MG Tabs Take 25 mg by mouth daily.   ZINC OXIDE (TOPICAL) 25 % Oint Apply 1 application topically 3 (three) times daily as needed.       Review of Systems  Constitutional: Positive for fatigue. Negative for appetite change and fever.  HENT: Positive for hearing loss and trouble swallowing. Negative for congestion, sore throat and voice change.   Eyes: Positive for visual disturbance. Negative for pain.       Minimal vision of the right eye  Respiratory: Negative for cough and shortness of breath.   Cardiovascular: Negative for leg swelling.  Gastrointestinal: Negative for abdominal pain and constipation.  Genitourinary: Negative for dysuria, hematuria and urgency.       New onset urinary incontinence.   Musculoskeletal: Positive for arthralgias, back pain and gait problem.  Skin: Positive for wound. Negative for color change.  Neurological: Negative for speech difficulty, weakness and light-headedness.  Psychiatric/Behavioral: Negative for behavioral problems and sleep disturbance. The patient is not nervous/anxious.     Immunization History  Administered Date(s) Administered  . Influenza Split 10/20/2011, 11/03/2012  . Influenza, High Dose Seasonal PF 10/24/2018  . Influenza,inj,Quad PF,6+ Mos 09/29/2012, 11/09/2013  . Influenza-Unspecified 10/17/2014, 10/23/2015, 10/20/2016, 10/17/2017, 10/16/2019  . Moderna Sars-Covid-2 Vaccination 01/15/2019, 02/12/2019, 11/26/2019  . PPD Test 08/21/2013, 03/04/2014  . Pneumococcal Conjugate-13 08/21/2013  . Pneumococcal Polysaccharide-23 09/08/2016  . Tdap 05/20/2008  . Tetanus 11/02/2018  . Zoster, Live 11/01/2012   Pertinent  Health Maintenance Due  Topic Date Due  . FOOT EXAM  Never done  . OPHTHALMOLOGY EXAM  Never  done  . URINE MICROALBUMIN  Never done  . HEMOGLOBIN A1C  06/28/2019  . INFLUENZA VACCINE  08/11/2020  . DEXA SCAN  Completed  . PNA vac Low Risk Adult  Completed   Fall Risk  11/04/2017 09/05/2017 09/22/2016 08/25/2016 05/31/2016  Falls in the past year? Yes Yes Yes Yes No  Number falls in past yr: 2 or more 2 or more 1 2 or more -  Comment - - - - -  Injury with Fall? Yes Yes Yes Yes -  Comment - - fx 3 ribs - -  Risk Factor Category  - - - - -  Risk for fall due to : - - Impaired mobility;Impaired balance/gait - -  Risk for fall due to: Comment - - - - -   Functional Status Survey:    Vitals:   06/11/20 1700  BP: 116/70  Pulse: 81  Resp: 18  Temp: 98.8 F (37.1 C)  SpO2: 92%  Weight: 80 lb (36.3 kg)  Height: 5\' 2"  (1.575 m)   Body mass index is 14.63 kg/m. Physical Exam Vitals and nursing note reviewed.  Constitutional:      Appearance: Normal appearance.  HENT:     Head: Normocephalic  and atraumatic.     Mouth/Throat:     Mouth: Mucous membranes are moist.  Eyes:     General: Vision grossly intact. Gaze aligned appropriately. No allergic shiner or visual field deficit.       Right eye: No foreign body or hordeolum.        Left eye: No foreign body or hordeolum.     Extraocular Movements: Extraocular movements intact.     Conjunctiva/sclera: Conjunctivae normal.     Right eye: Right conjunctiva is not injected. No exudate or hemorrhage.    Left eye: Left conjunctiva is not injected. No exudate or hemorrhage.    Pupils: Pupils are equal, round, and reactive to light.  Cardiovascular:     Rate and Rhythm: Normal rate. Rhythm irregular.     Heart sounds: No murmur heard.   Pulmonary:     Effort: Pulmonary effort is normal.     Breath sounds: No rales.  Abdominal:     General: Bowel sounds are normal.     Palpations: Abdomen is soft.     Tenderness: There is no abdominal tenderness.  Musculoskeletal:     Cervical back: Normal range of motion and neck supple.      Right lower leg: No edema.     Left lower leg: No edema.  Skin:    General: Skin is warm and dry.     Comments: Hematomas left forehead scalp open wound, yellow drainage seen.   Neurological:     General: No focal deficit present.     Mental Status: She is alert. Mental status is at baseline.     Gait: Gait abnormal.     Comments: Oriented to person, place.   Psychiatric:        Mood and Affect: Mood normal.        Behavior: Behavior normal.        Thought Content: Thought content normal.     Labs reviewed: Recent Labs    05/29/20 0323 05/30/20 0407 05/31/20 0346  NA 140 139 139  K 4.6 4.5 4.4  CL 104 105 103  CO2 29 27 28   GLUCOSE 141* 168* 143*  BUN 25* 33* 40*  CREATININE 0.87 1.04* 0.91  CALCIUM 9.4 8.6* 8.8*   Recent Labs    01/21/20 0000 02/10/20 1330 05/28/20 0831  AST 20 27 43*  ALT 12 19 18   ALKPHOS 69 62 64  BILITOT  --  0.7 1.0  PROT  --  6.7 6.9  ALBUMIN 3.3* 3.6 3.7   Recent Labs    04/10/20 0000 05/22/20 0000 05/28/20 0831 05/29/20 0323 05/30/20 0407 05/31/20 0346 06/01/20 0357  WBC 10.8 7.7 19.9*   < > 22.7* 14.0* 12.5*  NEUTROABS 8,046.00 5,159.00 17.4*  --   --   --   --   HGB 14.6 12.8 13.6   < > 12.3 10.7* 10.4*  HCT 43 38 41.2   < > 37.3 33.3* 32.4*  MCV  --   --  105.1*   < > 107.5* 108.8* 108.7*  PLT 228 202 215   < > 186 179 197   < > = values in this interval not displayed.   Lab Results  Component Value Date   TSH 2.68 09/03/2019   Lab Results  Component Value Date   HGBA1C 5.8 12/28/2018   Lab Results  Component Value Date   CHOL 187 11/03/2017   HDL 84 (A) 11/03/2017   LDLCALC 80 11/03/2017   TRIG 136  11/03/2017   CHOLHDL 2.1 07/29/2016    Significant Diagnostic Results in last 30 days:  DG Chest 1 View  Result Date: 05/28/2020 CLINICAL DATA:  87 year old female status post fall with left femoral neck fracture. EXAM: CHEST  1 VIEW COMPARISON:  Portable chest 03/30/2019 and earlier. FINDINGS: Supine AP  views at 0744 hours. Chest is somewhat overpenetrated. Chronic pulmonary hyperinflation. Stable cardiomegaly and mediastinal contours. Allowing for portable technique the lungs are clear. No pneumothorax. Visualized tracheal air column is within normal limits. Advanced degenerative changes at the thoracolumbar junction with partially visible lumbar fusion hardware. No acute osseous abnormality identified in the chest. IMPRESSION: Pulmonary hyperinflation.  No acute cardiopulmonary abnormality. Electronically Signed   By: Genevie Ann M.D.   On: 05/28/2020 07:57   CT Head Wo Contrast  Result Date: 05/28/2020 CLINICAL DATA:  Head and neck trauma Unwitnessed fall Found on floor at nursing home Left forehead hematoma EXAM: CT HEAD WITHOUT CONTRAST CT CERVICAL SPINE WITHOUT CONTRAST TECHNIQUE: Multidetector CT imaging of the head and cervical spine was performed following the standard protocol without intravenous contrast. Multiplanar CT image reconstructions of the cervical spine were also generated. COMPARISON:  04/05/2020 FINDINGS: CT HEAD FINDINGS Brain: No evidence of acute infarction, hemorrhage, hydrocephalus, extra-axial collection or mass lesion/mass effect. Periventricular white matter hypodensity is a nonspecific finding, but most commonly relates to chronic ischemic small vessel disease. 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 Vascular: No hyperdense vessel or unexpected calcification. Skull: Normal. Negative for fracture or focal lesion. Sinuses/Orbits: No acute finding. Other: Large left frontal hematoma.  Small left posterior hematoma. CT CERVICAL SPINE FINDINGS Alignment: Grade 1 anterolisthesis of C3 on C4 and C4 on C5. Grade 1 retrolisthesis of C5 on C6. Grade 1 anterolisthesis of C7 on T1. Skull base and vertebrae: Anterior subluxation of the dens relative to the tip of the clivus unchanged from prior examination and likely due to ligamentous laxity. No acute fracture  or dislocation. Soft tissues and spinal canal: No acute fracture or dislocation is identified. Prevertebral soft tissues are normal thickness. Disc levels: Advanced degenerative changes again seen throughout the cervical spine. Upper chest: Biapical pleuroparenchymal scarring. Other: None IMPRESSION: 1. Large left frontal scalp hematoma. No acute intracranial abnormality. 2. No acute fracture or dislocation of the cervical or visualized upper thoracic spine. Electronically Signed   By: Miachel Roux M.D.   On: 05/28/2020 08:22   CT Cervical Spine Wo Contrast  Result Date: 05/28/2020 CLINICAL DATA:  Head and neck trauma Unwitnessed fall Found on floor at nursing home Left forehead hematoma EXAM: CT HEAD WITHOUT CONTRAST CT CERVICAL SPINE WITHOUT CONTRAST TECHNIQUE: Multidetector CT imaging of the head and cervical spine was performed following the standard protocol without intravenous contrast. Multiplanar CT image reconstructions of the cervical spine were also generated. COMPARISON:  04/05/2020 FINDINGS: CT HEAD FINDINGS Brain: No evidence of acute infarction, hemorrhage, hydrocephalus, extra-axial collection or mass lesion/mass effect. Periventricular white matter hypodensity is a nonspecific finding, but most commonly relates to chronic ischemic small vessel disease. 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 Vascular: No hyperdense vessel or unexpected calcification. Skull: Normal. Negative for fracture or focal lesion. Sinuses/Orbits: No acute finding. Other: Large left frontal hematoma.  Small left posterior hematoma. CT CERVICAL SPINE FINDINGS Alignment: Grade 1 anterolisthesis of C3 on C4 and C4 on C5. Grade 1 retrolisthesis of C5 on C6. Grade 1 anterolisthesis of C7 on T1. Skull base and vertebrae: Anterior subluxation of the dens  relative to the tip of the clivus unchanged from prior examination and likely due to ligamentous laxity. No acute fracture or dislocation. Soft  tissues and spinal canal: No acute fracture or dislocation is identified. Prevertebral soft tissues are normal thickness. Disc levels: Advanced degenerative changes again seen throughout the cervical spine. Upper chest: Biapical pleuroparenchymal scarring. Other: None IMPRESSION: 1. Large left frontal scalp hematoma. No acute intracranial abnormality. 2. No acute fracture or dislocation of the cervical or visualized upper thoracic spine. Electronically Signed   By: Miachel Roux M.D.   On: 05/28/2020 08:22   Pelvis Portable  Result Date: 05/29/2020 CLINICAL DATA:  Postop EXAM: PORTABLE PELVIS 1-2 VIEWS COMPARISON:  04/05/2020, 05/28/2020 FINDINGS: Interval left hip hemiarthroplasty with intact hardware and normal alignment. Gas in the soft tissues consistent with recent surgery. Pubic symphysis and rami are intact IMPRESSION: Left hip replacement with expected surgical changes Electronically Signed   By: Donavan Foil M.D.   On: 05/29/2020 18:32   DG Knee Complete 4 Views Left  Result Date: 05/28/2020 CLINICAL DATA:  87 year old female status post fall with left femoral neck fracture. EXAM: LEFT KNEE - COMPLETE 4+ VIEW COMPARISON:  Left hip series today.  Left knee series 12/01/2016. FINDINGS: AP oblique and cross-table lateral views. Joint spaces and alignment remain normal for age. No acute osseous abnormality identified. No joint effusion. Bone mineralization is within normal limits for age. IMPRESSION: No acute fracture or dislocation identified about the left knee. Electronically Signed   By: Genevie Ann M.D.   On: 05/28/2020 10:47   DG C-Arm 1-60 Min-No Report  Result Date: 05/29/2020 Fluoroscopy was utilized by the requesting physician.  No radiographic interpretation.   OCT, Retina - OU - Both Eyes  Result Date: 05/16/2020 Right Eye Quality was good. Central Foveal Thickness: 165. Progression has improved. Findings include retinal drusen , inner retinal atrophy, intraretinal hyper-reflective  material, abnormal foveal contour, outer retinal atrophy, no SRF, no IRF (Interval improvement in massive central CME -- resolved). Left Eye Quality was good. Central Foveal Thickness: 223. Progression has been stable. Findings include normal foveal contour, no SRF, no IRF, retinal drusen , outer retinal atrophy (Mild patchy ORA; no significant change from prior). Notes *Images captured and stored on drive Diagnosis / Impression: non-exu ARMD OU OD: CRVO; Interval improvement in massive central CME -- resolved OS: NFP; no IRF/SRF; patchy ORA - stable Clinical management: See below Abbreviations: NFP - Normal foveal profile. CME - cystoid macular edema. PED - pigment epithelial detachment. IRF - intraretinal fluid. SRF - subretinal fluid. EZ - ellipsoid zone. ERM - epiretinal membrane. ORA - outer retinal atrophy. ORT - outer retinal tubulation. SRHM - subretinal hyper-reflective material   DG HIP OPERATIVE UNILAT W OR W/O PELVIS LEFT  Result Date: 05/29/2020 CLINICAL DATA:  Hip surgery EXAM: OPERATIVE left HIP (WITH PELVIS IF PERFORMED) 2 VIEWS TECHNIQUE: Fluoroscopic spot image(s) were submitted for interpretation post-operatively. COMPARISON:  05/28/2020 FINDINGS: Two low resolution intraoperative spot views of the left hip. Total fluoroscopy time was 4 seconds. Images demonstrate a left hip hemiarthroplasty IMPRESSION: Intraoperative fluoroscopic assistance provided during left hip surgery Electronically Signed   By: Donavan Foil M.D.   On: 05/29/2020 18:31   DG Hip Unilat With Pelvis 2-3 Views Left  Result Date: 05/28/2020 CLINICAL DATA:  87 year old female status post fall with left hip pain. EXAM: DG HIP (WITH OR WITHOUT PELVIS) 2-3V LEFT COMPARISON:  Abdominal radiographs 11/14/2014. FINDINGS: AP and cross-table lateral views demonstrate an acute left femoral  neck fracture with impaction. Left femoral head remains normally located. Intertrochanteric segment remains intact. Grossly intact proximal  right femur. No acute pelvic fracture identified. Chronic lumbar fusion hardware. Non obstructed visible bowel gas pattern. IMPRESSION: Acute, impacted left femoral neck fracture. Electronically Signed   By: Genevie Ann M.D.   On: 05/28/2020 07:55    Assessment/Plan   Loss, vision, sudden, bilateralresolved after a few minutes w/o intervention, TIA vs MD, saw ophthalmology 05/19/20 Carotid stenosis 2016 US carotid artery:Left totally occluded. Right 40% occlusion. 2/4/22Carotid R+L artery US(last done 2014)showed no significnt stenosis of the internal carotid artery. Vascular dementia ([place]) CT head 02/10/20 chronic small vessel ischemic disease, no acute intracranial process. Prn Lorazepam available to her.   Anemia, stable, Hgb 10.4 06/01/20 Neuropathy,takesLyrica Dysphagia,s/p dilatation food impaction, f/u GI, takes Famotidine 20mg  qd Atrial fibrillation with RVR (HCC),takes Atenolol, Diltiazem, ASA 81mg  qd.EKG 02/11/20 Afib Constipation,takes IB Gard, MiraLax prn, Senokot S II qd, MOM Dysphonia,chronic, ENT Osteoarthritis,takes Tylenol, Oxycodone, Methocarbamol CKD (chronic kidney disease) stage 3, GFR 30-59 ml/min (HCC),Bun/creat 40/0.91 05/31/20    Family/ staff Communication: plan of care reviewed with the patient and charge nurse.   Labs/tests ordered:  none  Time spend 35 minutes.

## 2020-06-11 NOTE — Assessment & Plan Note (Signed)
Lower back pain, takes Lyrica, Oxycodone, Methocarbamol, Tylenl.

## 2020-06-11 NOTE — Assessment & Plan Note (Signed)
Bun/creat 40/0.91 05/31/20

## 2020-06-11 NOTE — Assessment & Plan Note (Signed)
Heart rate is in control, takes Atenolol, Diltiazem, ASA 81mg  qd.EKG 02/11/20 Afib

## 2020-06-11 NOTE — Assessment & Plan Note (Signed)
Loss, vision, sudden, bilateralresolved after a few minutes w/o intervention, TIA vs MD, saw ophthalmology 05/19/20

## 2020-06-11 NOTE — Assessment & Plan Note (Signed)
CT head 02/10/20 chronic small vessel ischemic disease, no acute intracranial process. Prn Lorazepam available to her.

## 2020-06-11 NOTE — Assessment & Plan Note (Signed)
Long standing issue, underwent ENT evaluation in the past.

## 2020-06-11 NOTE — Assessment & Plan Note (Signed)
s/p dilatation food impaction, f/u GI, takes Famotidine 20mg  qd

## 2020-06-11 NOTE — Assessment & Plan Note (Signed)
Hgb 10.4 06/01/20

## 2020-06-12 ENCOUNTER — Encounter: Payer: Self-pay | Admitting: Nurse Practitioner

## 2020-06-12 DIAGNOSIS — E43 Unspecified severe protein-calorie malnutrition: Secondary | ICD-10-CM | POA: Diagnosis not present

## 2020-06-12 DIAGNOSIS — E1322 Other specified diabetes mellitus with diabetic chronic kidney disease: Secondary | ICD-10-CM | POA: Diagnosis not present

## 2020-06-12 DIAGNOSIS — F0150 Vascular dementia without behavioral disturbance: Secondary | ICD-10-CM | POA: Diagnosis not present

## 2020-06-12 DIAGNOSIS — D72829 Elevated white blood cell count, unspecified: Secondary | ICD-10-CM | POA: Diagnosis not present

## 2020-06-12 DIAGNOSIS — I4891 Unspecified atrial fibrillation: Secondary | ICD-10-CM | POA: Diagnosis not present

## 2020-06-12 DIAGNOSIS — N183 Chronic kidney disease, stage 3 unspecified: Secondary | ICD-10-CM | POA: Diagnosis not present

## 2020-06-12 DIAGNOSIS — S7290XD Unspecified fracture of unspecified femur, subsequent encounter for closed fracture with routine healing: Secondary | ICD-10-CM | POA: Diagnosis not present

## 2020-06-12 NOTE — Addendum Note (Signed)
Addended by: Elenor Legato E on: 06/12/2020 08:32 AM   Modules accepted: Level of Service, SmartSet

## 2020-06-12 NOTE — Progress Notes (Signed)
This encounter was created in error - please disregard.

## 2020-06-13 DIAGNOSIS — N183 Chronic kidney disease, stage 3 unspecified: Secondary | ICD-10-CM | POA: Diagnosis not present

## 2020-06-13 DIAGNOSIS — S7290XD Unspecified fracture of unspecified femur, subsequent encounter for closed fracture with routine healing: Secondary | ICD-10-CM | POA: Diagnosis not present

## 2020-06-13 DIAGNOSIS — E1322 Other specified diabetes mellitus with diabetic chronic kidney disease: Secondary | ICD-10-CM | POA: Diagnosis not present

## 2020-06-13 DIAGNOSIS — E43 Unspecified severe protein-calorie malnutrition: Secondary | ICD-10-CM | POA: Diagnosis not present

## 2020-06-13 DIAGNOSIS — F0150 Vascular dementia without behavioral disturbance: Secondary | ICD-10-CM | POA: Diagnosis not present

## 2020-06-13 DIAGNOSIS — I4891 Unspecified atrial fibrillation: Secondary | ICD-10-CM | POA: Diagnosis not present

## 2020-06-16 ENCOUNTER — Encounter: Payer: Self-pay | Admitting: Orthopedic Surgery

## 2020-06-16 ENCOUNTER — Non-Acute Institutional Stay (SKILLED_NURSING_FACILITY): Payer: Medicare Other | Admitting: Orthopedic Surgery

## 2020-06-16 DIAGNOSIS — K5901 Slow transit constipation: Secondary | ICD-10-CM

## 2020-06-16 DIAGNOSIS — N183 Chronic kidney disease, stage 3 unspecified: Secondary | ICD-10-CM

## 2020-06-16 DIAGNOSIS — K219 Gastro-esophageal reflux disease without esophagitis: Secondary | ICD-10-CM

## 2020-06-16 DIAGNOSIS — E43 Unspecified severe protein-calorie malnutrition: Secondary | ICD-10-CM | POA: Diagnosis not present

## 2020-06-16 DIAGNOSIS — I4819 Other persistent atrial fibrillation: Secondary | ICD-10-CM | POA: Diagnosis not present

## 2020-06-16 DIAGNOSIS — I4891 Unspecified atrial fibrillation: Secondary | ICD-10-CM | POA: Diagnosis not present

## 2020-06-16 DIAGNOSIS — F0150 Vascular dementia without behavioral disturbance: Secondary | ICD-10-CM | POA: Diagnosis not present

## 2020-06-16 DIAGNOSIS — I10 Essential (primary) hypertension: Secondary | ICD-10-CM | POA: Diagnosis not present

## 2020-06-16 DIAGNOSIS — R296 Repeated falls: Secondary | ICD-10-CM | POA: Diagnosis not present

## 2020-06-16 DIAGNOSIS — Z96649 Presence of unspecified artificial hip joint: Secondary | ICD-10-CM | POA: Diagnosis not present

## 2020-06-16 DIAGNOSIS — E1322 Other specified diabetes mellitus with diabetic chronic kidney disease: Secondary | ICD-10-CM | POA: Diagnosis not present

## 2020-06-16 DIAGNOSIS — G629 Polyneuropathy, unspecified: Secondary | ICD-10-CM | POA: Diagnosis not present

## 2020-06-16 DIAGNOSIS — S7290XD Unspecified fracture of unspecified femur, subsequent encounter for closed fracture with routine healing: Secondary | ICD-10-CM | POA: Diagnosis not present

## 2020-06-16 MED ORDER — ACETAMINOPHEN 500 MG PO TABS
1000.0000 mg | ORAL_TABLET | Freq: Two times a day (BID) | ORAL | 0 refills | Status: DC
Start: 2020-06-16 — End: 2022-04-23

## 2020-06-16 NOTE — Progress Notes (Signed)
Location:   [place] Room Number: 5 Place of Service:  SNF [phone]) Provider:  Windell Moulding, NP  [name], MD  Patient Care Team: [name], MD as PCP - General (Internal Medicine) Angelia Mould, MD as Attending Physician (Vascular Surgery) [name], MD (Inactive) as Attending Physician (Obstetrics and Gynecology) [name], MD (Orthopedic Surgery) [name], MD (Ophthalmology) [name], MD as Consulting Physician (Urology) [name], MD as Consulting Physician (Cardiology) [name], PA-C as Physician Assistant (Otolaryngology) [name], MD as Consulting Physician (Otolaryngology) [name], MD as Consulting Physician (Dermatology) [name], MD as Consulting Physician (Physical Medicine and Rehabilitation) [name], MD as Attending Physician (Gastroenterology) Mast, Man X, NP as Nurse Practitioner (Internal Medicine) Kathrynn Ducking, MD as Consulting Physician (Neurology) Debara Pickett Nadean Corwin, MD as Consulting Physician (Cardiology) Druscilla Brownie, MD as Consulting Physician (Dermatology) [name], [name], NP as Nurse Practitioner (Family Medicine)  Extended Emergency Contact Information Primary Emergency Contact: [name] of [place] Phone: [phone] Mobile Phone: [phone] Relation: Son Secondary Emergency Contact: Serita Grit [place] of [place] Phone: [phone] Mobile Phone: [phone] Relation: Other  Code Status:  DNR Goals of care: Advanced Directive information Advanced Directives 06/16/2020  Does Patient Have a Medical Advance Directive? Yes  Type of Paramedic of [place];Out of facility DNR (pink MOST or yellow form)  Does patient want to make changes to medical advance directive? No - Patient declined  Copy of [place] in Chart? Yes - validated most recent copy scanned in chart  (See row information)  Would patient like information on creating a medical advance directive? -  Pre-existing out of facility DNR order (yellow form or pink MOST form) Yellow form placed in chart (order not valid for inpatient use);Pink MOST form placed in chart (order not valid for inpatient use)     Chief Complaint  Patient presents with  . Acute Visit    Frequent falls    HPI:  Pt is a 87 y.o. female seen today for an acute visit for frequent falling.   She currently resides on the skilled nursing unit at [facility]. Past medical history includes: atrial fibrillation, peripheral vascular disease, cricopharyngeal hypertrophy, GERD, hypothyroidism, vascular dementia, CKD stage III, hyperlipidemia, anxiety, and severe protein-calorie malnutrition.   05/18- 05/22 she was hospitalized at [facility] for left femur fracture. 05/19 she underwent left hip hemiarthroplasty anterior approach by Dr. Rod Can. She developed a-fib with RVR after procedure. IV diltiazem started, transitioned to po. Remains poor candidate for anticoagulation.   Today, nursing staff reports unwitnessed fall 06/04. No apparent injury reported. In addition, nursing staff reports she is sleeping more and eating less. Today she is very lethargic during encounter. She is able to open for eyes for a few seconds. She will answer questions with "yes" and "no" responses. She is currently being given oxycodone 5 mg Q 6hrs for pain. She is also given robaxin 250 mg po Q6 hrs for muscle spasms. Remains moderate assistance with transfers and standing. WBAT  GERD remains in Pepcid.  Neuropathy stable with lyrica.  Allergies stable with daily Claritin and Flonase.  Constipation, last BM 06/15/2020, takes senna daily and prn miralax.  Chronic kidney disease stage 3 discharge creatinine 0.91, BUN 40.  Vascular dementia stable with prn ativan, no recent behavioral outbursts.  Dysphagia noted, started on dysphagia 1 diet  with aspiration precautions.  Leukocytosis trended down during  hospitalization, WBC 12.5 at discharge.   Past Medical History:  Diagnosis Date  . Abdominal bloating   . Atrial fibrillation ([place])   . Bruises easily   . Carotid artery occlusion    LEFT  . Cricopharyngeal dysphagia   . Dizziness   . DOE (dyspnea on exertion) 04/02/2014  . Fall at home Sept 2013, Dec. 2013  Jun 08, 2012  . GERD (gastroesophageal reflux disease) 02/11/2015  . Headache(784.0)   . Hoarseness   . Hypercholesterolemia   . Hyperglycemia 04/02/2014   Glucose 178 mg percent on 01/22/2014 04/05/14 Hgb A1c 6.6 Diet controlled.    . Hypertensive retinopathy    OU  . Hypothyroidism 04/15/2015  . Macular degeneration    Dry OU  . Neuropathy    PERIPHERAL  . Pruritus   . Scoliosis   . Varicose veins   . Zenker's diverticulum    Past Surgical History:  Procedure Laterality Date  . ABDOMINAL HYSTERECTOMY  1954  . ANTERIOR APPROACH HEMI HIP ARTHROPLASTY Left 05/29/2020   Procedure: ANTERIOR APPROACH HEMI HIP ARTHROPLASTY;  Surgeon: Rod Can, MD;  Location: [facility];  Service: Orthopedics;  Laterality: Left;  . CATARACT EXTRACTION Bilateral   . CHOLECYSTECTOMY  1997  . ESOPHAGOGASTRODUODENOSCOPY N/A 03/30/2019   Procedure: ESOPHAGOGASTRODUODENOSCOPY (EGD);  Surgeon: Jerene Bears, MD;  Location: [facility];  Service: Gastroenterology;  Laterality: N/A;  . ESOPHAGOGASTRODUODENOSCOPY (EGD) WITH PROPOFOL N/A 11/25/2016   Procedure: ESOPHAGOGASTRODUODENOSCOPY (EGD) WITH PROPOFOL;  Surgeon: [name], MD;  Location: [facility];  Service: Endoscopy;  Laterality: N/A;  . EYE SURGERY Bilateral    Cat Sx  . SPINE SURGERY  1997   correct scoliosis    No Known Allergies  Allergies as of 06/16/2020   No Known Allergies     Medication List       Accurate as of June 16, 2020 11:52 AM. If you have any questions, ask your nurse or doctor.        STOP taking these medications   Milk of Magnesia 400  MG/5ML suspension Generic drug: magnesium hydroxide Stopped by: Yvonna Alanis, NP     TAKE these medications   (feeding supplement) PROSource Plus liquid Take 30 mLs by mouth daily.   acetaminophen 500 MG tablet Commonly known as: TYLENOL Take 500 mg by mouth 2 (two) times daily.   acetaminophen 325 MG tablet Commonly known as: TYLENOL Take 650 mg by mouth every 12 (twelve) hours as needed for mild pain.   aspirin 81 MG chewable tablet Chew 81 mg by mouth daily.   diltiazem 30 MG tablet Commonly known as: CARDIZEM Take 1 tablet (30 mg total) by mouth every 12 (twelve) hours.   famotidine 20 MG tablet Commonly known as: PEPCID Take 20 mg by mouth daily.   fluticasone 50 MCG/ACT nasal spray Commonly known as: FLONASE Place 2 sprays into both nostrils daily.   IBgard 90 MG Cpcr Generic drug: Peppermint Oil Take 180 mg by mouth 2 (two) times daily.   loratadine 10 MG tablet Commonly known as: CLARITIN Take 10 mg by mouth daily as needed for allergies.   LORazepam 0.5 MG tablet Commonly known as: ATIVAN Take 1 tablet (0.5 mg total) by mouth 2 (two) times daily as needed for anxiety (agitation).   Menthol (Topical Analgesic) 4 % Gel Apply 1 application topically every 8 (eight) hours as needed (pain).   methocarbamol 500 MG tablet Commonly known as: ROBAXIN Take 250 mg by mouth every 6 (six) hours as  needed for muscle spasms.   NON FORMULARY Med Pass liquid; 2.0; amt: 180 ML; oral Special Instructions: Med pass 2.0 180ML. Doc- % consumed. Three Times A Day   oxyCODONE 5 MG immediate release tablet Commonly known as: Oxy IR/ROXICODONE Take 1 tablet (5 mg total) by mouth every 6 (six) hours as needed for severe pain.   polyethylene glycol 17 g packet Commonly known as: MIRALAX / GLYCOLAX Take 8.5-17 g by mouth See admin instructions. 8.5g in the morning scheduled,  And 17g daily as needed for constipation   pregabalin 25 MG capsule Commonly known as: LYRICA Take  1 capsule (25 mg total) by mouth 2 (two) times daily.   sennosides-docusate sodium 8.6-50 MG tablet Commonly known as: SENOKOT-S Take 2 tablets by mouth daily.   sodium fluoride 1.1 % Crea dental cream Commonly known as: PREVIDENT 5000 PLUS Place 1 application onto teeth every evening.   sodium phosphate 7-19 GM/118ML Enem Place 1 enema rectally daily as needed for severe constipation. Give Fleet enema per rectum X 1 after removing hard stool or if soft stool is present in rectum.   Systane Balance 0.6 % Soln Generic drug: Propylene Glycol Give 1 drop to both eyes once daily as needed for dryness   THERA MULTI-VITAMIN PO Take 1 tablet by mouth daily.   Vitamin B-2 25 MG Tabs Take 25 mg by mouth daily.   ZINC OXIDE (TOPICAL) 25 % Oint Apply 1 application topically 3 (three) times daily as needed.       Review of Systems  Unable to perform ROS: Dementia    Immunization History  Administered Date(s) Administered  . Influenza Split 10/20/2011, 11/03/2012  . Influenza, High Dose Seasonal PF 10/24/2018  . Influenza,inj,Quad PF,6+ Mos 09/29/2012, 11/09/2013  . Influenza-Unspecified 10/17/2014, 10/23/2015, 10/20/2016, 10/17/2017, 10/16/2019  . Moderna Sars-Covid-2 Vaccination 01/15/2019, 02/12/2019, 11/26/2019  . PPD Test 08/21/2013, 03/04/2014  . Pneumococcal Conjugate-13 08/21/2013  . Pneumococcal Polysaccharide-23 09/08/2016  . Tdap 05/20/2008  . Tetanus 11/02/2018  . Zoster, Live 11/01/2012   Pertinent  Health Maintenance Due  Topic Date Due  . FOOT EXAM  Never done  . OPHTHALMOLOGY EXAM  Never done  . URINE MICROALBUMIN  Never done  . HEMOGLOBIN A1C  06/28/2019  . INFLUENZA VACCINE  08/11/2020  . DEXA SCAN  Completed  . PNA vac Low Risk Adult  Completed   Fall Risk  11/04/2017 09/05/2017 09/22/2016 08/25/2016 05/31/2016  Falls in the past year? Yes Yes Yes Yes No  Number falls in past yr: 2 or more 2 or more 1 2 or more -  Comment - - - - -  Injury with Fall? Yes  Yes Yes Yes -  Comment - - fx 3 ribs - -  Risk Factor Category  - - - - -  Risk for fall due to : - - Impaired mobility;Impaired balance/gait - -  Risk for fall due to: Comment - - - - -   Functional Status Survey:    Vitals:   06/16/20 1047  BP: 133/67  Pulse: 88  Temp: 97.7 F (36.5 C)  SpO2: 95%  Weight: 81 lb (36.7 kg)  Height: 5\' 2"  (1.575 m)   Body mass index is 14.82 kg/m. Physical Exam Vitals reviewed.  Constitutional:      Appearance: She is cachectic.     Comments: Frail  HENT:     Head: Normocephalic.  Eyes:     General:        Right eye: No discharge.  Left eye: No discharge.  Cardiovascular:     Rate and Rhythm: Normal rate. Rhythm irregular.     Pulses: Normal pulses.     Heart sounds: Normal heart sounds. No murmur heard.   Pulmonary:     Effort: Pulmonary effort is normal. No respiratory distress.     Breath sounds: Normal breath sounds. No wheezing.  Abdominal:     General: Abdomen is flat. Bowel sounds are normal. There is no distension.     Palpations: Abdomen is soft.     Tenderness: There is no abdominal tenderness.  Musculoskeletal:     Cervical back: Normal range of motion.     Right lower leg: No edema.     Left lower leg: No edema.     Comments: Left dorsiflexion 4/5.   Lymphadenopathy:     Cervical: No cervical adenopathy.  Skin:    General: Skin is warm and dry.     Capillary Refill: Capillary refill takes less than 2 seconds.     Comments: Left anterior hip incision, CDI, staples intact. No drainage. Surrounding skin absent of erythema and swelling.   Neurological:     General: No focal deficit present.     Mental Status: She is easily aroused. Mental status is at baseline.     Motor: Weakness present.     Gait: Gait abnormal.  Psychiatric:        Mood and Affect: Mood normal.        Behavior: Behavior normal.        Cognition and Memory: Memory is impaired.     Labs reviewed: Recent Labs    05/29/20 0323  05/30/20 0407 05/31/20 0346  NA 140 139 139  K 4.6 4.5 4.4  CL 104 105 103  CO2 29 27 28   GLUCOSE 141* 168* 143*  BUN 25* 33* 40*  CREATININE 0.87 1.04* 0.91  CALCIUM 9.4 8.6* 8.8*   Recent Labs    01/21/20 0000 02/10/20 1330 05/28/20 0831  AST 20 27 43*  ALT 12 19 18   ALKPHOS 69 62 64  BILITOT  --  0.7 1.0  PROT  --  6.7 6.9  ALBUMIN 3.3* 3.6 3.7   Recent Labs    04/10/20 0000 05/22/20 0000 05/28/20 0831 05/29/20 0323 05/30/20 0407 05/31/20 0346 06/01/20 0357  WBC 10.8 7.7 19.9*   < > 22.7* 14.0* 12.5*  NEUTROABS 8,046.00 5,159.00 17.4*  --   --   --   --   HGB 14.6 12.8 13.6   < > 12.3 10.7* 10.4*  HCT 43 38 41.2   < > 37.3 33.3* 32.4*  MCV  --   --  105.1*   < > 107.5* 108.8* 108.7*  PLT 228 202 215   < > 186 179 197   < > = values in this interval not displayed.   Lab Results  Component Value Date   TSH 2.68 09/03/2019   Lab Results  Component Value Date   HGBA1C 5.8 12/28/2018   Lab Results  Component Value Date   CHOL 187 11/03/2017   HDL 84 (A) 11/03/2017   LDLCALC 80 11/03/2017   TRIG 136 11/03/2017   CHOLHDL 2.1 07/29/2016    Significant Diagnostic Results in last 30 days:  DG Chest 1 View  Result Date: 05/28/2020 CLINICAL DATA:  87 year old female status post fall with left femoral neck fracture. EXAM: CHEST  1 VIEW COMPARISON:  Portable chest 03/30/2019 and earlier. FINDINGS: Supine AP views at 0744 hours. Chest  is somewhat overpenetrated. Chronic pulmonary hyperinflation. Stable cardiomegaly and mediastinal contours. Allowing for portable technique the lungs are clear. No pneumothorax. Visualized tracheal air column is within normal limits. Advanced degenerative changes at the thoracolumbar junction with partially visible lumbar fusion hardware. No acute osseous abnormality identified in the chest. IMPRESSION: Pulmonary hyperinflation.  No acute cardiopulmonary abnormality. Electronically Signed   By: Genevie Ann M.D.   On: 05/28/2020 07:57   CT  Head Wo Contrast  Result Date: 05/28/2020 CLINICAL DATA:  Head and neck trauma Unwitnessed fall Found on floor at nursing home Left forehead hematoma EXAM: CT HEAD WITHOUT CONTRAST CT CERVICAL SPINE WITHOUT CONTRAST TECHNIQUE: Multidetector CT imaging of the head and cervical spine was performed following the standard protocol without intravenous contrast. Multiplanar CT image reconstructions of the cervical spine were also generated. COMPARISON:  04/05/2020 FINDINGS: CT HEAD FINDINGS Brain: No evidence of acute infarction, hemorrhage, hydrocephalus, extra-axial collection or mass lesion/mass effect. Periventricular white matter hypodensity is a nonspecific finding, but most commonly relates to chronic ischemic small vessel disease. 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 Vascular: No hyperdense vessel or unexpected calcification. Skull: Normal. Negative for fracture or focal lesion. Sinuses/Orbits: No acute finding. Other: Large left frontal hematoma.  Small left posterior hematoma. CT CERVICAL SPINE FINDINGS Alignment: Grade 1 anterolisthesis of C3 on C4 and C4 on C5. Grade 1 retrolisthesis of C5 on C6. Grade 1 anterolisthesis of C7 on T1. Skull base and vertebrae: Anterior subluxation of the dens relative to the tip of the clivus unchanged from prior examination and likely due to ligamentous laxity. No acute fracture or dislocation. Soft tissues and spinal canal: No acute fracture or dislocation is identified. Prevertebral soft tissues are normal thickness. Disc levels: Advanced degenerative changes again seen throughout the cervical spine. Upper chest: Biapical pleuroparenchymal scarring. Other: None IMPRESSION: 1. Large left frontal scalp hematoma. No acute intracranial abnormality. 2. No acute fracture or dislocation of the cervical or visualized upper thoracic spine. Electronically Signed   By: Miachel Roux M.D.   On: 05/28/2020 08:22   CT Cervical Spine Wo  Contrast  Result Date: 05/28/2020 CLINICAL DATA:  Head and neck trauma Unwitnessed fall Found on floor at nursing home Left forehead hematoma EXAM: CT HEAD WITHOUT CONTRAST CT CERVICAL SPINE WITHOUT CONTRAST TECHNIQUE: Multidetector CT imaging of the head and cervical spine was performed following the standard protocol without intravenous contrast. Multiplanar CT image reconstructions of the cervical spine were also generated. COMPARISON:  04/05/2020 FINDINGS: CT HEAD FINDINGS Brain: No evidence of acute infarction, hemorrhage, hydrocephalus, extra-axial collection or mass lesion/mass effect. Periventricular white matter hypodensity is a nonspecific finding, but most commonly relates to chronic ischemic small vessel disease. 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 Vascular: No hyperdense vessel or unexpected calcification. Skull: Normal. Negative for fracture or focal lesion. Sinuses/Orbits: No acute finding. Other: Large left frontal hematoma.  Small left posterior hematoma. CT CERVICAL SPINE FINDINGS Alignment: Grade 1 anterolisthesis of C3 on C4 and C4 on C5. Grade 1 retrolisthesis of C5 on C6. Grade 1 anterolisthesis of C7 on T1. Skull base and vertebrae: Anterior subluxation of the dens relative to the tip of the clivus unchanged from prior examination and likely due to ligamentous laxity. No acute fracture or dislocation. Soft tissues and spinal canal: No acute fracture or dislocation is identified. Prevertebral soft tissues are normal thickness. Disc levels: Advanced degenerative changes again seen throughout the cervical spine. Upper chest: Biapical pleuroparenchymal scarring. Other: None  IMPRESSION: 1. Large left frontal scalp hematoma. No acute intracranial abnormality. 2. No acute fracture or dislocation of the cervical or visualized upper thoracic spine. Electronically Signed   By: Miachel Roux M.D.   On: 05/28/2020 08:22   Pelvis Portable  Result Date:  05/29/2020 CLINICAL DATA:  Postop EXAM: PORTABLE PELVIS 1-2 VIEWS COMPARISON:  04/05/2020, 05/28/2020 FINDINGS: Interval left hip hemiarthroplasty with intact hardware and normal alignment. Gas in the soft tissues consistent with recent surgery. Pubic symphysis and rami are intact IMPRESSION: Left hip replacement with expected surgical changes Electronically Signed   By: Donavan Foil M.D.   On: 05/29/2020 18:32   DG Knee Complete 4 Views Left  Result Date: 05/28/2020 CLINICAL DATA:  87 year old female status post fall with left femoral neck fracture. EXAM: LEFT KNEE - COMPLETE 4+ VIEW COMPARISON:  Left hip series today.  Left knee series 12/01/2016. FINDINGS: AP oblique and cross-table lateral views. Joint spaces and alignment remain normal for age. No acute osseous abnormality identified. No joint effusion. Bone mineralization is within normal limits for age. IMPRESSION: No acute fracture or dislocation identified about the left knee. Electronically Signed   By: Genevie Ann M.D.   On: 05/28/2020 10:47   DG C-Arm 1-60 Min-No Report  Result Date: 05/29/2020 Fluoroscopy was utilized by the requesting physician.  No radiographic interpretation.   DG HIP OPERATIVE UNILAT W OR W/O PELVIS LEFT  Result Date: 05/29/2020 CLINICAL DATA:  Hip surgery EXAM: OPERATIVE left HIP (WITH PELVIS IF PERFORMED) 2 VIEWS TECHNIQUE: Fluoroscopic spot image(s) were submitted for interpretation post-operatively. COMPARISON:  05/28/2020 FINDINGS: Two low resolution intraoperative spot views of the left hip. Total fluoroscopy time was 4 seconds. Images demonstrate a left hip hemiarthroplasty IMPRESSION: Intraoperative fluoroscopic assistance provided during left hip surgery Electronically Signed   By: Donavan Foil M.D.   On: 05/29/2020 18:31   DG Hip Unilat With Pelvis 2-3 Views Left  Result Date: 05/28/2020 CLINICAL DATA:  87 year old female status post fall with left hip pain. EXAM: DG HIP (WITH OR WITHOUT PELVIS) 2-3V LEFT  COMPARISON:  Abdominal radiographs 11/14/2014. FINDINGS: AP and cross-table lateral views demonstrate an acute left femoral neck fracture with impaction. Left femoral head remains normally located. Intertrochanteric segment remains intact. Grossly intact proximal right femur. No acute pelvic fracture identified. Chronic lumbar fusion hardware. Non obstructed visible bowel gas pattern. IMPRESSION: Acute, impacted left femoral neck fracture. Electronically Signed   By: Genevie Ann M.D.   On: 05/28/2020 07:55    Assessment/Plan: 1. Frequent falls - recent fall 06/04 without injury - concerned prolonged oxycodone use - discontinue oxycodone - cont falls safety precautions  2. S/P hip hemiarthroplasty - followed by Dr. Lyla Glassing- f/u 06/08 - incision CDI, staples intact - remains WBAT,  moderate assist with transfers and standing - discontinue oxycodone - start tylenol 1000 mg po bid  3. Vascular dementia without behavioral disturbance ([place]) - CT head 02/10/2020 confirmed chronic small vessel ischemic disease - no recent behavioral outbursts - cont prn ativan  4. Persistent atrial fibrillation ([facility]) - stable with po diltiazem - remains poor candidate for anticoagulation  5. Slow transit constipation - last BM 06/15/2020  6. Stage 3 chronic kidney disease, unspecified whether stage 3a or 3b CKD ([place]) - continue to avoid nephrotoxic drugs like NSAIDS and dose adjust medications to be renally excreted - encourage hydration  7. Gastroesophageal reflux disease without esophagitis - stable with pepcid  8. Severe protein-calorie malnutrition ([place]) - weight unchanged at 81 lbs - cont  med pass and pro-source plus  9. Neuropathy - cont Lyrica   Family/ staff Communication: plan discussed with patient and nurse  Labs/tests ordered:  none

## 2020-06-17 ENCOUNTER — Encounter: Payer: Self-pay | Admitting: Nurse Practitioner

## 2020-06-17 DIAGNOSIS — I4891 Unspecified atrial fibrillation: Secondary | ICD-10-CM | POA: Diagnosis not present

## 2020-06-17 DIAGNOSIS — F0150 Vascular dementia without behavioral disturbance: Secondary | ICD-10-CM | POA: Diagnosis not present

## 2020-06-17 DIAGNOSIS — S7290XD Unspecified fracture of unspecified femur, subsequent encounter for closed fracture with routine healing: Secondary | ICD-10-CM | POA: Diagnosis not present

## 2020-06-17 DIAGNOSIS — E1322 Other specified diabetes mellitus with diabetic chronic kidney disease: Secondary | ICD-10-CM | POA: Diagnosis not present

## 2020-06-17 DIAGNOSIS — E43 Unspecified severe protein-calorie malnutrition: Secondary | ICD-10-CM | POA: Diagnosis not present

## 2020-06-17 DIAGNOSIS — N183 Chronic kidney disease, stage 3 unspecified: Secondary | ICD-10-CM | POA: Diagnosis not present

## 2020-06-17 DIAGNOSIS — E870 Hyperosmolality and hypernatremia: Secondary | ICD-10-CM | POA: Insufficient documentation

## 2020-06-18 DIAGNOSIS — E1322 Other specified diabetes mellitus with diabetic chronic kidney disease: Secondary | ICD-10-CM | POA: Diagnosis not present

## 2020-06-18 DIAGNOSIS — Z23 Encounter for immunization: Secondary | ICD-10-CM | POA: Diagnosis not present

## 2020-06-18 DIAGNOSIS — E43 Unspecified severe protein-calorie malnutrition: Secondary | ICD-10-CM | POA: Diagnosis not present

## 2020-06-18 DIAGNOSIS — S72032D Displaced midcervical fracture of left femur, subsequent encounter for closed fracture with routine healing: Secondary | ICD-10-CM | POA: Diagnosis not present

## 2020-06-18 DIAGNOSIS — S7290XD Unspecified fracture of unspecified femur, subsequent encounter for closed fracture with routine healing: Secondary | ICD-10-CM | POA: Diagnosis not present

## 2020-06-18 DIAGNOSIS — F0150 Vascular dementia without behavioral disturbance: Secondary | ICD-10-CM | POA: Diagnosis not present

## 2020-06-18 DIAGNOSIS — I4891 Unspecified atrial fibrillation: Secondary | ICD-10-CM | POA: Diagnosis not present

## 2020-06-18 DIAGNOSIS — N183 Chronic kidney disease, stage 3 unspecified: Secondary | ICD-10-CM | POA: Diagnosis not present

## 2020-06-19 DIAGNOSIS — N183 Chronic kidney disease, stage 3 unspecified: Secondary | ICD-10-CM | POA: Diagnosis not present

## 2020-06-19 DIAGNOSIS — E43 Unspecified severe protein-calorie malnutrition: Secondary | ICD-10-CM | POA: Diagnosis not present

## 2020-06-19 DIAGNOSIS — S7290XD Unspecified fracture of unspecified femur, subsequent encounter for closed fracture with routine healing: Secondary | ICD-10-CM | POA: Diagnosis not present

## 2020-06-19 DIAGNOSIS — E1322 Other specified diabetes mellitus with diabetic chronic kidney disease: Secondary | ICD-10-CM | POA: Diagnosis not present

## 2020-06-19 DIAGNOSIS — F0150 Vascular dementia without behavioral disturbance: Secondary | ICD-10-CM | POA: Diagnosis not present

## 2020-06-19 DIAGNOSIS — I4891 Unspecified atrial fibrillation: Secondary | ICD-10-CM | POA: Diagnosis not present

## 2020-06-20 DIAGNOSIS — S7290XD Unspecified fracture of unspecified femur, subsequent encounter for closed fracture with routine healing: Secondary | ICD-10-CM | POA: Diagnosis not present

## 2020-06-20 DIAGNOSIS — F0150 Vascular dementia without behavioral disturbance: Secondary | ICD-10-CM | POA: Diagnosis not present

## 2020-06-20 DIAGNOSIS — E43 Unspecified severe protein-calorie malnutrition: Secondary | ICD-10-CM | POA: Diagnosis not present

## 2020-06-20 DIAGNOSIS — E1322 Other specified diabetes mellitus with diabetic chronic kidney disease: Secondary | ICD-10-CM | POA: Diagnosis not present

## 2020-06-20 DIAGNOSIS — N183 Chronic kidney disease, stage 3 unspecified: Secondary | ICD-10-CM | POA: Diagnosis not present

## 2020-06-20 DIAGNOSIS — I4891 Unspecified atrial fibrillation: Secondary | ICD-10-CM | POA: Diagnosis not present

## 2020-06-23 DIAGNOSIS — E43 Unspecified severe protein-calorie malnutrition: Secondary | ICD-10-CM | POA: Diagnosis not present

## 2020-06-23 DIAGNOSIS — R7989 Other specified abnormal findings of blood chemistry: Secondary | ICD-10-CM | POA: Diagnosis not present

## 2020-06-23 DIAGNOSIS — S7290XD Unspecified fracture of unspecified femur, subsequent encounter for closed fracture with routine healing: Secondary | ICD-10-CM | POA: Diagnosis not present

## 2020-06-23 DIAGNOSIS — N183 Chronic kidney disease, stage 3 unspecified: Secondary | ICD-10-CM | POA: Diagnosis not present

## 2020-06-23 DIAGNOSIS — F0150 Vascular dementia without behavioral disturbance: Secondary | ICD-10-CM | POA: Diagnosis not present

## 2020-06-23 DIAGNOSIS — E1322 Other specified diabetes mellitus with diabetic chronic kidney disease: Secondary | ICD-10-CM | POA: Diagnosis not present

## 2020-06-23 DIAGNOSIS — I4891 Unspecified atrial fibrillation: Secondary | ICD-10-CM | POA: Diagnosis not present

## 2020-06-23 LAB — BASIC METABOLIC PANEL
BUN: 19 (ref 4–21)
CO2: 33 — AB (ref 13–22)
Chloride: 103 (ref 99–108)
Creatinine: 0.7 (ref 0.5–1.1)
Glucose: 117
Potassium: 2.8 — AB (ref 3.4–5.3)
Sodium: 148 — AB (ref 137–147)

## 2020-06-23 LAB — COMPREHENSIVE METABOLIC PANEL
Calcium: 10.1 (ref 8.7–10.7)
GFR calc Af Amer: 78
GFR calc non Af Amer: 67

## 2020-06-24 ENCOUNTER — Encounter: Payer: Self-pay | Admitting: Nurse Practitioner

## 2020-06-24 ENCOUNTER — Non-Acute Institutional Stay (SKILLED_NURSING_FACILITY): Payer: Medicare Other | Admitting: Nurse Practitioner

## 2020-06-24 DIAGNOSIS — G629 Polyneuropathy, unspecified: Secondary | ICD-10-CM

## 2020-06-24 DIAGNOSIS — E870 Hyperosmolality and hypernatremia: Secondary | ICD-10-CM

## 2020-06-24 DIAGNOSIS — H53133 Sudden visual loss, bilateral: Secondary | ICD-10-CM

## 2020-06-24 DIAGNOSIS — R0902 Hypoxemia: Secondary | ICD-10-CM | POA: Diagnosis not present

## 2020-06-24 DIAGNOSIS — I6522 Occlusion and stenosis of left carotid artery: Secondary | ICD-10-CM | POA: Diagnosis not present

## 2020-06-24 DIAGNOSIS — R627 Adult failure to thrive: Secondary | ICD-10-CM | POA: Diagnosis not present

## 2020-06-24 DIAGNOSIS — K5900 Constipation, unspecified: Secondary | ICD-10-CM | POA: Diagnosis not present

## 2020-06-24 DIAGNOSIS — F0150 Vascular dementia without behavioral disturbance: Secondary | ICD-10-CM | POA: Diagnosis not present

## 2020-06-24 DIAGNOSIS — E1322 Other specified diabetes mellitus with diabetic chronic kidney disease: Secondary | ICD-10-CM | POA: Diagnosis not present

## 2020-06-24 DIAGNOSIS — R1310 Dysphagia, unspecified: Secondary | ICD-10-CM

## 2020-06-24 DIAGNOSIS — I4891 Unspecified atrial fibrillation: Secondary | ICD-10-CM | POA: Diagnosis not present

## 2020-06-24 DIAGNOSIS — N183 Chronic kidney disease, stage 3 unspecified: Secondary | ICD-10-CM | POA: Diagnosis not present

## 2020-06-24 DIAGNOSIS — S7290XD Unspecified fracture of unspecified femur, subsequent encounter for closed fracture with routine healing: Secondary | ICD-10-CM | POA: Diagnosis not present

## 2020-06-24 DIAGNOSIS — E43 Unspecified severe protein-calorie malnutrition: Secondary | ICD-10-CM | POA: Diagnosis not present

## 2020-06-24 DIAGNOSIS — E876 Hypokalemia: Secondary | ICD-10-CM | POA: Diagnosis not present

## 2020-06-24 NOTE — Assessment & Plan Note (Signed)
Possibly aspiration given her long standing Dysphagia. Her goal of care is comfort measures. Will apply O2 2lpm via [facility]. Hospice referral.

## 2020-06-24 NOTE — Assessment & Plan Note (Signed)
2016 US carotid artery: Left totally occluded. Right 40% occlusion. 02/15/20 Carotid R+L artery US(last done 2014) showed no significnt stenosis of the internal carotid artery.

## 2020-06-24 NOTE — Assessment & Plan Note (Signed)
Takes Lyrica.

## 2020-06-24 NOTE — Assessment & Plan Note (Signed)
Chronic dysphagia, gradual cognitive declining are contributory, will provide supportive care in SNF [facility]

## 2020-06-24 NOTE — Assessment & Plan Note (Signed)
takes IB Gard, MiraLax prn, Senokot S II qd, MOM

## 2020-06-24 NOTE — Assessment & Plan Note (Signed)
CT head 02/10/20 chronic small vessel ischemic disease, no acute intracranial process. Prn Lorazepam available to her.

## 2020-06-24 NOTE — Assessment & Plan Note (Signed)
06/23/20 serum K 2.8, will have Kcl 63meq qd available if the patient tolerates po med.

## 2020-06-24 NOTE — Assessment & Plan Note (Signed)
Heart rate is in control, akes Diltiazem, ASA 81mg  qd. EKG 02/11/20 Afib

## 2020-06-24 NOTE — Progress Notes (Signed)
Location:   [place] Room Number: N05 Place of Service:  SNF (31) Provider:  Sheketa Ende Otho Darner, NP    Patient Care Team: [name], MD as PCP - General (Internal Medicine) Angelia Mould, MD as Attending Physician (Vascular Surgery) [name], MD (Inactive) as Attending Physician (Obstetrics and Gynecology) [name], MD (Orthopedic Surgery) [name], MD (Ophthalmology) [name], MD as Consulting Physician (Urology) [name], MD as Consulting Physician (Cardiology) [name], PA-C as Physician Assistant (Otolaryngology) [name], MD as Consulting Physician (Otolaryngology) [name], MD as Consulting Physician (Dermatology) [name], MD as Consulting Physician (Physical Medicine and Rehabilitation) [name], MD as Attending Physician (Gastroenterology) Renay Crammer X, NP as Nurse Practitioner (Internal Medicine) Kathrynn Ducking, MD as Consulting Physician (Neurology) Debara Pickett Nadean Corwin, MD as Consulting Physician (Cardiology) Druscilla Brownie, MD as Consulting Physician (Dermatology) [name], [name], NP as Nurse Practitioner (Family Medicine)  Extended Emergency Contact Information Primary Emergency Contact: [name] of [place] Phone: [phone] Mobile Phone: [phone] Relation: Son Secondary Emergency Contact: Serita Grit [place] of [place] Phone: [phone] Mobile Phone: [phone] Relation: Other  Code Status:  DNR Goals of care: Advanced Directive information Advanced Directives 06/24/2020  Does Patient Have a Medical Advance Directive? Yes  Type of Advance Directive [place]  Does patient want to make changes to medical advance directive? No - Patient declined  Copy of [place] in Chart? Yes - validated most recent copy scanned in chart (See row information)  Would patient like information on  creating a medical advance directive? -  Pre-existing out of facility DNR order (yellow form or pink MOST form) -     Chief Complaint  Patient presents with   Acute Visit    Patient presents today for O2 desaturation.     HPI:  Pt is a 87 y.o. female seen today for an acute visit for O2 desaturation, able to maintain Sat O2>90% when O2 2lpm via [place] applied. The patient is lethargic, able to open eyes when spoke to, no apparent focal weakness. The patient has limited oral intake for a few days, modified diet made.      Loss, vision, sudden, bilateral resolved after a few minutes w/o intervention, TIA vs MD, saw ophthalmology 05/19/20             Carotid stenosis 2016 US carotid artery: Left totally occluded. Right 40% occlusion. 02/15/20 Carotid R+L artery US(last done 2014) showed no significnt stenosis of the internal carotid artery.              Vascular dementia ([place]) CT head 02/10/20 chronic small vessel ischemic disease, no acute intracranial process. Prn Lorazepam available to her.             Anemia, stable, Hgb 10.4 06/01/20             Neuropathy, takes Lyrica             Dysphagia, s/p dilatation food impaction, f/u GI, takes Famotidine 20mg  qd             Atrial fibrillation with RVR ([place]), takes Diltiazem, ASA 81mg  qd. EKG 02/11/20 Afib             Constipation, takes IB Gard, MiraLax prn, Senokot S II qd, MOM             Dysphonia,  chronic, ENT  Osteoarthritis, takes Tylenol, Oxycodone, Methocarbamol              CKD (chronic kidney disease) stage 3, GFR 30-59 ml/min (HCC), Bun/creat 19/0.74 06/23/20    Past Medical History:  Diagnosis Date   Abdominal bloating    Atrial fibrillation (HCC)    Bruises easily    Carotid artery occlusion    LEFT   Cricopharyngeal dysphagia    Dizziness    DOE (dyspnea on exertion) 04/02/2014   Fall at home Sept 2013, Dec. 2013  Jun 08, 2012   GERD (gastroesophageal reflux disease) 02/11/2015   Headache(784.0)    Hoarseness     Hypercholesterolemia    Hyperglycemia 04/02/2014   Glucose 178 mg percent on 01/22/2014 04/05/14 Hgb A1c 6.6 Diet controlled.     Hypertensive retinopathy    OU   Hypothyroidism 04/15/2015   Macular degeneration    Dry OU   Neuropathy    PERIPHERAL   Pruritus    Scoliosis    Varicose veins    Zenker's diverticulum    Past Surgical History:  Procedure Laterality Date   ABDOMINAL HYSTERECTOMY  1954   ANTERIOR APPROACH HEMI HIP ARTHROPLASTY Left 05/29/2020   Procedure: ANTERIOR APPROACH HEMI HIP ARTHROPLASTY;  Surgeon: Rod Can, MD;  Location: [facility];  Service: Orthopedics;  Laterality: Left;   CATARACT EXTRACTION Bilateral    CHOLECYSTECTOMY  1997   ESOPHAGOGASTRODUODENOSCOPY N/A 03/30/2019   Procedure: ESOPHAGOGASTRODUODENOSCOPY (EGD);  Surgeon: Jerene Bears, MD;  Location: [facility];  Service: Gastroenterology;  Laterality: N/A;   ESOPHAGOGASTRODUODENOSCOPY (EGD) WITH PROPOFOL N/A 11/25/2016   Procedure: ESOPHAGOGASTRODUODENOSCOPY (EGD) WITH PROPOFOL;  Surgeon: [name], MD;  Location: [facility];  Service: Endoscopy;  Laterality: N/A;   EYE SURGERY Bilateral    Cat Sx   SPINE SURGERY  1997   correct scoliosis    No Known Allergies  Allergies as of 06/24/2020   No Known Allergies      Medication List        Accurate as of June 24, 2020 10:59 AM. If you have any questions, ask your nurse or doctor.          (feeding supplement) PROSource Plus liquid Take 30 mLs by mouth daily.   acetaminophen 325 MG tablet Commonly known as: TYLENOL Take 650 mg by mouth every 12 (twelve) hours as needed for mild pain.   acetaminophen 500 MG tablet Commonly known as: TYLENOL Take 2 tablets (1,000 mg total) by mouth 2 (two) times daily.   aspirin 81 MG chewable tablet Chew 81 mg by mouth daily.   diltiazem 30 MG tablet Commonly known as: CARDIZEM Take 1 tablet (30 mg total) by mouth every 12 (twelve) hours.   famotidine 20 MG tablet Commonly known as:  PEPCID Take 20 mg by mouth daily.   fluticasone 50 MCG/ACT nasal spray Commonly known as: FLONASE Place 2 sprays into both nostrils daily.   IBgard 90 MG Cpcr Generic drug: Peppermint Oil Take 180 mg by mouth 2 (two) times daily.   loratadine 10 MG tablet Commonly known as: CLARITIN Take 10 mg by mouth daily as needed for allergies.   LORazepam 0.5 MG tablet Commonly known as: ATIVAN Take 1 tablet (0.5 mg total) by mouth 2 (two) times daily as needed for anxiety (agitation).   Menthol (Topical Analgesic) 4 % Gel Apply 1 application topically every 8 (eight) hours as needed (pain).   methocarbamol 500 MG tablet Commonly known as: ROBAXIN Take 250 mg by mouth every  6 (six) hours as needed for muscle spasms.   NON FORMULARY Med Pass liquid; 2.0; amt: 180 ML; oral Special Instructions: Med pass 2.0 180ML. Doc- % consumed. Three Times A Day   polyethylene glycol 17 g packet Commonly known as: MIRALAX / GLYCOLAX Take 8.5-17 g by mouth See admin instructions. 8.5g in the morning scheduled,  And 17g daily as needed for constipation   pregabalin 25 MG capsule Commonly known as: LYRICA Take 1 capsule (25 mg total) by mouth 2 (two) times daily.   sennosides-docusate sodium 8.6-50 MG tablet Commonly known as: SENOKOT-S Take 2 tablets by mouth daily.   sodium fluoride 1.1 % Crea dental cream Commonly known as: PREVIDENT 5000 PLUS Place 1 application onto teeth every evening.   sodium phosphate 7-19 GM/118ML Enem Place 1 enema rectally daily as needed for severe constipation. Give Fleet enema per rectum X 1 after removing hard stool or if soft stool is present in rectum.   Systane Balance 0.6 % Soln Generic drug: Propylene Glycol Give 1 drop to both eyes once daily as needed for dryness   THERA MULTI-VITAMIN PO Take 1 tablet by mouth daily.   Vitamin B-2 25 MG Tabs Take 25 mg by mouth daily.   ZINC OXIDE (TOPICAL) 25 % Oint Apply 1 application topically 3 (three) times  daily as needed.        Review of Systems  Constitutional:  Positive for activity change, appetite change and fatigue. Negative for fever.  HENT:  Positive for hearing loss, trouble swallowing and voice change. Negative for congestion.   Eyes:  Positive for visual disturbance. Negative for pain.       Minimal vision of the right eye  Respiratory:  Negative for cough, shortness of breath and wheezing.        O2 desaturation.   Cardiovascular:  Negative for leg swelling.  Gastrointestinal:  Negative for abdominal pain and constipation.  Genitourinary:  Negative for dysuria, hematuria and urgency.       New onset urinary incontinence.   Musculoskeletal:  Positive for arthralgias, back pain and gait problem.  Skin:  Negative for color change.  Neurological:  Negative for speech difficulty, weakness and light-headedness.  Psychiatric/Behavioral:  Negative for behavioral problems and sleep disturbance. The patient is not nervous/anxious.    Immunization History  Administered Date(s) Administered   Influenza Split 10/20/2011, 11/03/2012   Influenza, High Dose Seasonal PF 10/24/2018   Influenza,inj,Quad PF,6+ Mos 09/29/2012, 11/09/2013   Influenza-Unspecified 10/17/2014, 10/23/2015, 10/20/2016, 10/17/2017, 10/16/2019   Moderna SARS-COV2 Booster Vaccination 06/18/2020   Moderna Sars-Covid-2 Vaccination 01/15/2019, 02/12/2019, 11/26/2019   PPD Test 08/21/2013, 03/04/2014   Pneumococcal Conjugate-13 08/21/2013   Pneumococcal Polysaccharide-23 09/08/2016   Tdap 05/20/2008   Tetanus 11/02/2018   Zoster, Live 11/01/2012   Pertinent  Health Maintenance Due  Topic Date Due   FOOT EXAM  Never done   OPHTHALMOLOGY EXAM  Never done   URINE MICROALBUMIN  Never done   HEMOGLOBIN A1C  06/28/2019   INFLUENZA VACCINE  08/11/2020   DEXA SCAN  Completed   PNA vac Low Risk Adult  Completed   Fall Risk  11/04/2017 09/05/2017 09/22/2016 08/25/2016 05/31/2016  Falls in the past year? Yes Yes Yes Yes No   Number falls in past yr: 2 or more 2 or more 1 2 or more -  Comment - - - - -  Injury with Fall? Yes Yes Yes Yes -  Comment - - fx 3 ribs - -  Risk Factor  Category  - - - - -  Risk for fall due to : - - Impaired mobility;Impaired balance/gait - -  Risk for fall due to: Comment - - - - -   Functional Status Survey:    Vitals:   06/24/20 1012  BP: 128/64  Pulse: 66  Resp: 18  Temp: (!) 97.1 F (36.2 C)  SpO2: 99%  Weight: 81 lb (36.7 kg)  Height: 5\' 2"  (1.575 m)   Body mass index is 14.82 kg/m. Physical Exam Vitals and nursing note reviewed.  Constitutional:      Comments: Lethargic, weak  HENT:     Head: Normocephalic and atraumatic.     Mouth/Throat:     Mouth: Mucous membranes are dry.  Eyes:     General: Vision grossly intact. Gaze aligned appropriately. No allergic shiner or visual field deficit.       Right eye: No foreign body or hordeolum.        Left eye: No foreign body or hordeolum.     Extraocular Movements: Extraocular movements intact.     Conjunctiva/sclera: Conjunctivae normal.     Right eye: Right conjunctiva is not injected. No exudate or hemorrhage.    Left eye: Left conjunctiva is not injected. No exudate or hemorrhage.    Pupils: Pupils are equal, round, and reactive to light.  Cardiovascular:     Rate and Rhythm: Normal rate. Rhythm irregular.     Heart sounds: No murmur heard. Pulmonary:     Breath sounds: No rales.     Comments: O2 2lpm via [place] to maintain Sat O2>90% Abdominal:     General: Bowel sounds are normal.     Palpations: Abdomen is soft.     Tenderness: There is no abdominal tenderness.  Musculoskeletal:     Cervical back: Normal range of motion and neck supple.     Right lower leg: No edema.     Left lower leg: No edema.  Skin:    General: Skin is warm and dry.  Neurological:     General: No focal deficit present.     Mental Status: She is alert. Mental status is at baseline.     Gait: Gait abnormal.     Comments: Oriented  to person, place.   Psychiatric:        Mood and Affect: Mood normal.        Behavior: Behavior normal.    Labs reviewed: Recent Labs    05/29/20 0323 05/30/20 0407 05/31/20 0346 06/23/20 0000  NA 140 139 139 148*  K 4.6 4.5 4.4 2.8*  CL 104 105 103 103  CO2 29 27 28  33*  GLUCOSE 141* 168* 143*  --   BUN 25* 33* 40* 19  CREATININE 0.87 1.04* 0.91 0.7  CALCIUM 9.4 8.6* 8.8* 10.1   Recent Labs    01/21/20 0000 02/10/20 1330 05/28/20 0831  AST 20 27 43*  ALT 12 19 18   ALKPHOS 69 62 64  BILITOT  --  0.7 1.0  PROT  --  6.7 6.9  ALBUMIN 3.3* 3.6 3.7   Recent Labs    04/10/20 0000 05/22/20 0000 05/28/20 0831 05/29/20 0323 05/30/20 0407 05/31/20 0346 06/01/20 0357  WBC 10.8 7.7 19.9*   < > 22.7* 14.0* 12.5*  NEUTROABS 8,046.00 5,159.00 17.4*  --   --   --   --   HGB 14.6 12.8 13.6   < > 12.3 10.7* 10.4*  HCT 43 38 41.2   < > 37.3  33.3* 32.4*  MCV  --   --  105.1*   < > 107.5* 108.8* 108.7*  PLT 228 202 215   < > 186 179 197   < > = values in this interval not displayed.   Lab Results  Component Value Date   TSH 2.68 09/03/2019   Lab Results  Component Value Date   HGBA1C 5.8 12/28/2018   Lab Results  Component Value Date   CHOL 187 11/03/2017   HDL 84 (A) 11/03/2017   LDLCALC 80 11/03/2017   TRIG 136 11/03/2017   CHOLHDL 2.1 07/29/2016    Significant Diagnostic Results in last 30 days:  DG Chest 1 View  Result Date: 05/28/2020 CLINICAL DATA:  87 year old female status post fall with left femoral neck fracture. EXAM: CHEST  1 VIEW COMPARISON:  Portable chest 03/30/2019 and earlier. FINDINGS: Supine AP views at 0744 hours. Chest is somewhat overpenetrated. Chronic pulmonary hyperinflation. Stable cardiomegaly and mediastinal contours. Allowing for portable technique the lungs are clear. No pneumothorax. Visualized tracheal air column is within normal limits. Advanced degenerative changes at the thoracolumbar junction with partially visible lumbar fusion  hardware. No acute osseous abnormality identified in the chest. IMPRESSION: Pulmonary hyperinflation.  No acute cardiopulmonary abnormality. Electronically Signed   By: Genevie Ann M.D.   On: 05/28/2020 07:57   CT Head Wo Contrast  Result Date: 05/28/2020 CLINICAL DATA:  Head and neck trauma Unwitnessed fall Found on floor at nursing home Left forehead hematoma EXAM: CT HEAD WITHOUT CONTRAST CT CERVICAL SPINE WITHOUT CONTRAST TECHNIQUE: Multidetector CT imaging of the head and cervical spine was performed following the standard protocol without intravenous contrast. Multiplanar CT image reconstructions of the cervical spine were also generated. COMPARISON:  04/05/2020 FINDINGS: CT HEAD FINDINGS Brain: No evidence of acute infarction, hemorrhage, hydrocephalus, extra-axial collection or mass lesion/mass effect. Periventricular white matter hypodensity is a nonspecific finding, but most commonly relates to chronic ischemic small vessel disease. 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 Vascular: No hyperdense vessel or unexpected calcification. Skull: Normal. Negative for fracture or focal lesion. Sinuses/Orbits: No acute finding. Other: Large left frontal hematoma.  Small left posterior hematoma. CT CERVICAL SPINE FINDINGS Alignment: Grade 1 anterolisthesis of C3 on C4 and C4 on C5. Grade 1 retrolisthesis of C5 on C6. Grade 1 anterolisthesis of C7 on T1. Skull base and vertebrae: Anterior subluxation of the dens relative to the tip of the clivus unchanged from prior examination and likely due to ligamentous laxity. No acute fracture or dislocation. Soft tissues and spinal canal: No acute fracture or dislocation is identified. Prevertebral soft tissues are normal thickness. Disc levels: Advanced degenerative changes again seen throughout the cervical spine. Upper chest: Biapical pleuroparenchymal scarring. Other: None IMPRESSION: 1. Large left frontal scalp hematoma. No acute  intracranial abnormality. 2. No acute fracture or dislocation of the cervical or visualized upper thoracic spine. Electronically Signed   By: Miachel Roux M.D.   On: 05/28/2020 08:22   CT Cervical Spine Wo Contrast  Result Date: 05/28/2020 CLINICAL DATA:  Head and neck trauma Unwitnessed fall Found on floor at nursing home Left forehead hematoma EXAM: CT HEAD WITHOUT CONTRAST CT CERVICAL SPINE WITHOUT CONTRAST TECHNIQUE: Multidetector CT imaging of the head and cervical spine was performed following the standard protocol without intravenous contrast. Multiplanar CT image reconstructions of the cervical spine were also generated. COMPARISON:  04/05/2020 FINDINGS: CT HEAD FINDINGS Brain: No evidence of acute infarction, hemorrhage, hydrocephalus, extra-axial collection or mass lesion/mass effect. Periventricular  white matter hypodensity is a nonspecific finding, but most commonly relates to chronic ischemic small vessel disease. 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 Vascular: No hyperdense vessel or unexpected calcification. Skull: Normal. Negative for fracture or focal lesion. Sinuses/Orbits: No acute finding. Other: Large left frontal hematoma.  Small left posterior hematoma. CT CERVICAL SPINE FINDINGS Alignment: Grade 1 anterolisthesis of C3 on C4 and C4 on C5. Grade 1 retrolisthesis of C5 on C6. Grade 1 anterolisthesis of C7 on T1. Skull base and vertebrae: Anterior subluxation of the dens relative to the tip of the clivus unchanged from prior examination and likely due to ligamentous laxity. No acute fracture or dislocation. Soft tissues and spinal canal: No acute fracture or dislocation is identified. Prevertebral soft tissues are normal thickness. Disc levels: Advanced degenerative changes again seen throughout the cervical spine. Upper chest: Biapical pleuroparenchymal scarring. Other: None IMPRESSION: 1. Large left frontal scalp hematoma. No acute intracranial  abnormality. 2. No acute fracture or dislocation of the cervical or visualized upper thoracic spine. Electronically Signed   By: Miachel Roux M.D.   On: 05/28/2020 08:22   Pelvis Portable  Result Date: 05/29/2020 CLINICAL DATA:  Postop EXAM: PORTABLE PELVIS 1-2 VIEWS COMPARISON:  04/05/2020, 05/28/2020 FINDINGS: Interval left hip hemiarthroplasty with intact hardware and normal alignment. Gas in the soft tissues consistent with recent surgery. Pubic symphysis and rami are intact IMPRESSION: Left hip replacement with expected surgical changes Electronically Signed   By: Donavan Foil M.D.   On: 05/29/2020 18:32   DG Knee Complete 4 Views Left  Result Date: 05/28/2020 CLINICAL DATA:  87 year old female status post fall with left femoral neck fracture. EXAM: LEFT KNEE - COMPLETE 4+ VIEW COMPARISON:  Left hip series today.  Left knee series 12/01/2016. FINDINGS: AP oblique and cross-table lateral views. Joint spaces and alignment remain normal for age. No acute osseous abnormality identified. No joint effusion. Bone mineralization is within normal limits for age. IMPRESSION: No acute fracture or dislocation identified about the left knee. Electronically Signed   By: Genevie Ann M.D.   On: 05/28/2020 10:47   DG C-Arm 1-60 Min-No Report  Result Date: 05/29/2020 Fluoroscopy was utilized by the requesting physician.  No radiographic interpretation.   DG HIP OPERATIVE UNILAT W OR W/O PELVIS LEFT  Result Date: 05/29/2020 CLINICAL DATA:  Hip surgery EXAM: OPERATIVE left HIP (WITH PELVIS IF PERFORMED) 2 VIEWS TECHNIQUE: Fluoroscopic spot image(s) were submitted for interpretation post-operatively. COMPARISON:  05/28/2020 FINDINGS: Two low resolution intraoperative spot views of the left hip. Total fluoroscopy time was 4 seconds. Images demonstrate a left hip hemiarthroplasty IMPRESSION: Intraoperative fluoroscopic assistance provided during left hip surgery Electronically Signed   By: Donavan Foil M.D.   On:  05/29/2020 18:31   DG Hip Unilat With Pelvis 2-3 Views Left  Result Date: 05/28/2020 CLINICAL DATA:  87 year old female status post fall with left hip pain. EXAM: DG HIP (WITH OR WITHOUT PELVIS) 2-3V LEFT COMPARISON:  Abdominal radiographs 11/14/2014. FINDINGS: AP and cross-table lateral views demonstrate an acute left femoral neck fracture with impaction. Left femoral head remains normally located. Intertrochanteric segment remains intact. Grossly intact proximal right femur. No acute pelvic fracture identified. Chronic lumbar fusion hardware. Non obstructed visible bowel gas pattern. IMPRESSION: Acute, impacted left femoral neck fracture. Electronically Signed   By: Genevie Ann M.D.   On: 05/28/2020 07:55    Assessment/Plan Hypoxia Possibly aspiration given her long standing Dysphagia. Her goal of care is comfort measures. Will apply O2  2lpm via [place]. Hospice referral.   Adult failure to thrive Chronic dysphagia, gradual cognitive declining are contributory, will provide supportive care in SNF [facility]  Hypokalemia 06/23/20 serum K 2.8, will have Kcl 39meq qd available if the patient tolerates po med.   Hypernatremia Serum Na 148, due to limited oral intake, continue to encourage oral intake if tolerated.   Vascular dementia ([place]) CT head 02/10/20 chronic small vessel ischemic disease, no acute intracranial process. Prn Lorazepam available to her.  Dysphagia s/p dilatation food impaction, f/u GI, takes Famotidine 20mg  qd  Loss, vision, sudden, bilateral Loss, vision, sudden, bilateral resolved after a few minutes w/o intervention, TIA vs MD, saw ophthalmology 05/19/20  Carotid stenosis 2016 US carotid artery: Left totally occluded. Right 40% occlusion. 02/15/20 Carotid R+L artery US(last done 2014) showed no significnt stenosis of the internal carotid artery.   Neuropathy Takes Lyrica.   Atrial fibrillation with RVR (HCC) Heart rate is in control, akes Diltiazem, ASA 81mg  qd. EKG 02/11/20  Afib  Constipation takes IB Gard, MiraLax prn, Senokot S II qd, MOM    Family/ staff Communication: plan of care reviewed with the patient, the patient's son HPOA, and charge nurse.   Labs/tests ordered:  none  Time spend 35 minutes.

## 2020-06-24 NOTE — Assessment & Plan Note (Signed)
Loss, vision, sudden, bilateral resolved after a few minutes w/o intervention, TIA vs MD, saw ophthalmology 05/19/20

## 2020-06-24 NOTE — Assessment & Plan Note (Signed)
Serum Na 148, due to limited oral intake, continue to encourage oral intake if tolerated.

## 2020-06-24 NOTE — Assessment & Plan Note (Signed)
s/p dilatation food impaction, f/u GI, takes Famotidine 20mg  qd

## 2020-06-25 DIAGNOSIS — F0150 Vascular dementia without behavioral disturbance: Secondary | ICD-10-CM | POA: Diagnosis not present

## 2020-06-25 DIAGNOSIS — N183 Chronic kidney disease, stage 3 unspecified: Secondary | ICD-10-CM | POA: Diagnosis not present

## 2020-06-25 DIAGNOSIS — E1322 Other specified diabetes mellitus with diabetic chronic kidney disease: Secondary | ICD-10-CM | POA: Diagnosis not present

## 2020-06-25 DIAGNOSIS — S7290XD Unspecified fracture of unspecified femur, subsequent encounter for closed fracture with routine healing: Secondary | ICD-10-CM | POA: Diagnosis not present

## 2020-06-25 DIAGNOSIS — I4891 Unspecified atrial fibrillation: Secondary | ICD-10-CM | POA: Diagnosis not present

## 2020-06-25 DIAGNOSIS — E43 Unspecified severe protein-calorie malnutrition: Secondary | ICD-10-CM | POA: Diagnosis not present

## 2020-06-26 DIAGNOSIS — I6529 Occlusion and stenosis of unspecified carotid artery: Secondary | ICD-10-CM | POA: Diagnosis not present

## 2020-06-26 DIAGNOSIS — E1151 Type 2 diabetes mellitus with diabetic peripheral angiopathy without gangrene: Secondary | ICD-10-CM | POA: Diagnosis not present

## 2020-06-26 DIAGNOSIS — F0390 Unspecified dementia without behavioral disturbance: Secondary | ICD-10-CM | POA: Diagnosis not present

## 2020-06-26 DIAGNOSIS — J302 Other seasonal allergic rhinitis: Secondary | ICD-10-CM | POA: Diagnosis not present

## 2020-06-26 DIAGNOSIS — I4891 Unspecified atrial fibrillation: Secondary | ICD-10-CM | POA: Diagnosis not present

## 2020-06-26 DIAGNOSIS — N183 Chronic kidney disease, stage 3 unspecified: Secondary | ICD-10-CM | POA: Diagnosis not present

## 2020-06-26 DIAGNOSIS — E039 Hypothyroidism, unspecified: Secondary | ICD-10-CM | POA: Diagnosis not present

## 2020-06-26 DIAGNOSIS — Z8719 Personal history of other diseases of the digestive system: Secondary | ICD-10-CM | POA: Diagnosis not present

## 2020-06-26 DIAGNOSIS — K219 Gastro-esophageal reflux disease without esophagitis: Secondary | ICD-10-CM | POA: Diagnosis not present

## 2020-06-26 DIAGNOSIS — Z681 Body mass index (BMI) 19 or less, adult: Secondary | ICD-10-CM | POA: Diagnosis not present

## 2020-06-26 DIAGNOSIS — H3530 Unspecified macular degeneration: Secondary | ICD-10-CM | POA: Diagnosis not present

## 2020-06-26 DIAGNOSIS — I509 Heart failure, unspecified: Secondary | ICD-10-CM | POA: Diagnosis not present

## 2020-06-26 DIAGNOSIS — E1142 Type 2 diabetes mellitus with diabetic polyneuropathy: Secondary | ICD-10-CM | POA: Diagnosis not present

## 2020-06-26 DIAGNOSIS — E1122 Type 2 diabetes mellitus with diabetic chronic kidney disease: Secondary | ICD-10-CM | POA: Diagnosis not present

## 2020-06-26 DIAGNOSIS — R1310 Dysphagia, unspecified: Secondary | ICD-10-CM | POA: Diagnosis not present

## 2020-06-26 DIAGNOSIS — I70209 Unspecified atherosclerosis of native arteries of extremities, unspecified extremity: Secondary | ICD-10-CM | POA: Diagnosis not present

## 2020-06-26 DIAGNOSIS — Z8781 Personal history of (healed) traumatic fracture: Secondary | ICD-10-CM | POA: Diagnosis not present

## 2020-06-26 DIAGNOSIS — M810 Age-related osteoporosis without current pathological fracture: Secondary | ICD-10-CM | POA: Diagnosis not present

## 2020-06-26 DIAGNOSIS — E43 Unspecified severe protein-calorie malnutrition: Secondary | ICD-10-CM | POA: Diagnosis not present

## 2020-06-27 DIAGNOSIS — Z681 Body mass index (BMI) 19 or less, adult: Secondary | ICD-10-CM | POA: Diagnosis not present

## 2020-06-27 DIAGNOSIS — Z8781 Personal history of (healed) traumatic fracture: Secondary | ICD-10-CM | POA: Diagnosis not present

## 2020-06-27 DIAGNOSIS — F0390 Unspecified dementia without behavioral disturbance: Secondary | ICD-10-CM | POA: Diagnosis not present

## 2020-06-27 DIAGNOSIS — Z8719 Personal history of other diseases of the digestive system: Secondary | ICD-10-CM | POA: Diagnosis not present

## 2020-06-27 DIAGNOSIS — K219 Gastro-esophageal reflux disease without esophagitis: Secondary | ICD-10-CM | POA: Diagnosis not present

## 2020-06-27 DIAGNOSIS — E43 Unspecified severe protein-calorie malnutrition: Secondary | ICD-10-CM | POA: Diagnosis not present

## 2020-06-28 DIAGNOSIS — E43 Unspecified severe protein-calorie malnutrition: Secondary | ICD-10-CM | POA: Diagnosis not present

## 2020-06-28 DIAGNOSIS — K219 Gastro-esophageal reflux disease without esophagitis: Secondary | ICD-10-CM | POA: Diagnosis not present

## 2020-06-28 DIAGNOSIS — Z681 Body mass index (BMI) 19 or less, adult: Secondary | ICD-10-CM | POA: Diagnosis not present

## 2020-06-28 DIAGNOSIS — F0390 Unspecified dementia without behavioral disturbance: Secondary | ICD-10-CM | POA: Diagnosis not present

## 2020-06-28 DIAGNOSIS — Z8781 Personal history of (healed) traumatic fracture: Secondary | ICD-10-CM | POA: Diagnosis not present

## 2020-06-28 DIAGNOSIS — Z8719 Personal history of other diseases of the digestive system: Secondary | ICD-10-CM | POA: Diagnosis not present

## 2020-07-09 ENCOUNTER — Encounter (INDEPENDENT_AMBULATORY_CARE_PROVIDER_SITE_OTHER): Payer: Medicare Other | Admitting: Ophthalmology

## 2020-07-11 DEATH — deceased

## 2021-08-21 IMAGING — CT CT HEAD W/O CM
4 series · 17 of 47 positions shown, 19 images · non-contrast
Comparison: CT head dated 02/05/2017.

CLINICAL DATA: Altered mental status.

EXAM:
CT HEAD WITHOUT CONTRAST
TECHNIQUE: Contiguous axial images were obtained from the base of the skull
through the vertex without intravenous contrast.

[Series 3: head without · axial · non-contrast · 0.40mm/px · z∈[-82,+33]mm · 7 of 31 slices shown, 9 images]
[im 4/31  brain]
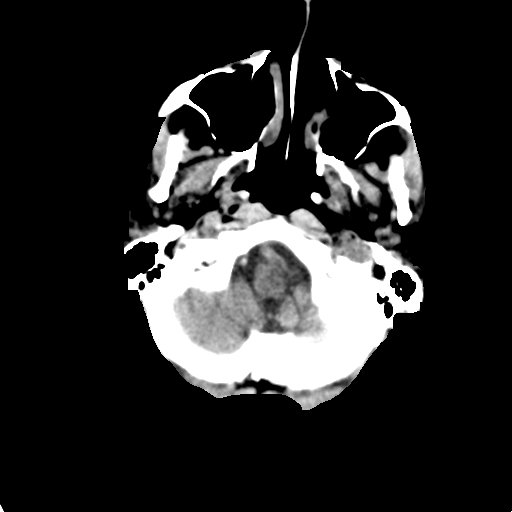
[im 4/31  bone]
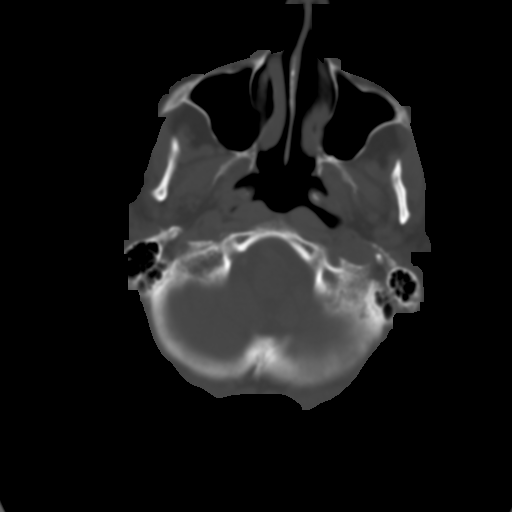
[im 8/31  brain]
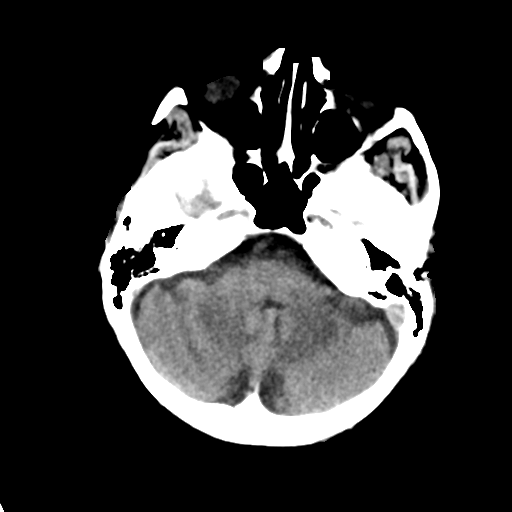
[im 12/31  brain]
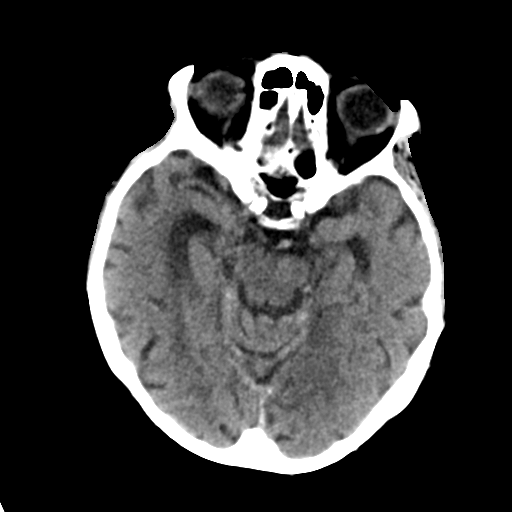
[im 16/31  brain]
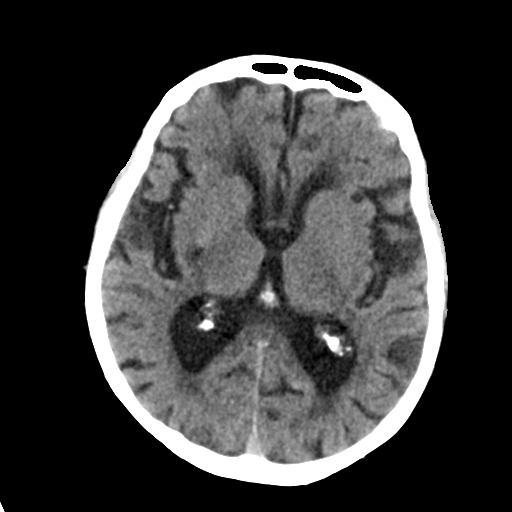
[im 19/31  brain]
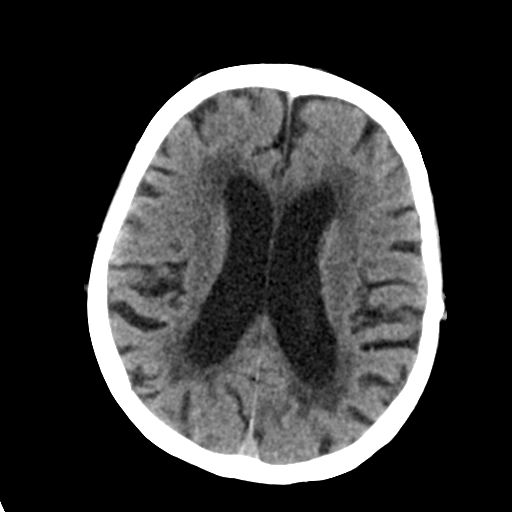
[im 19/31  bone]
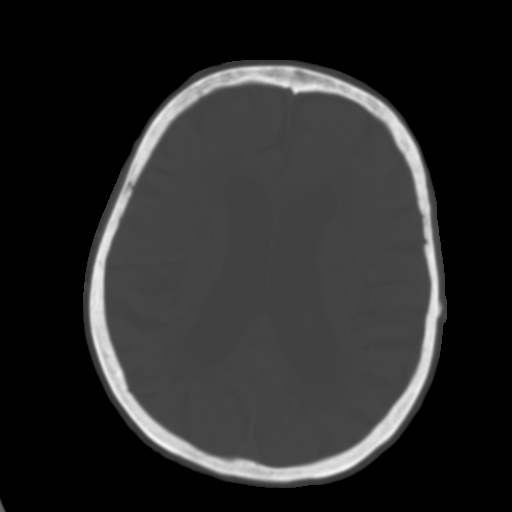
[im 23/31  brain]
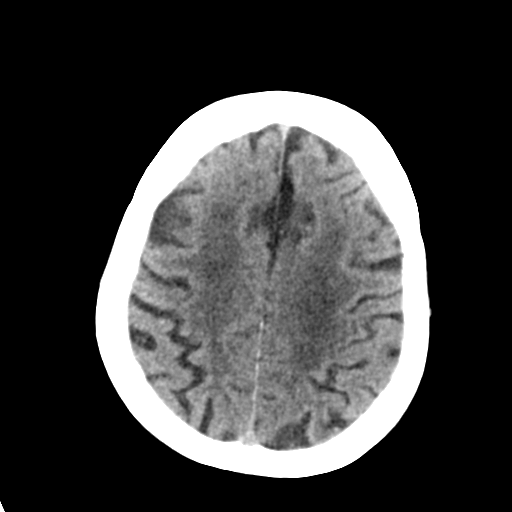
[im 27/31  brain]
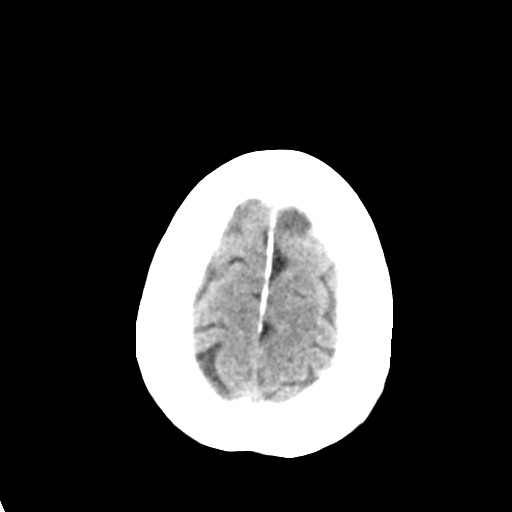

[Series 4: head bone · axial · 0.40mm/px · z∈[-83,-31]mm · 4 of 76 slices shown]
[im 8/76  bone]
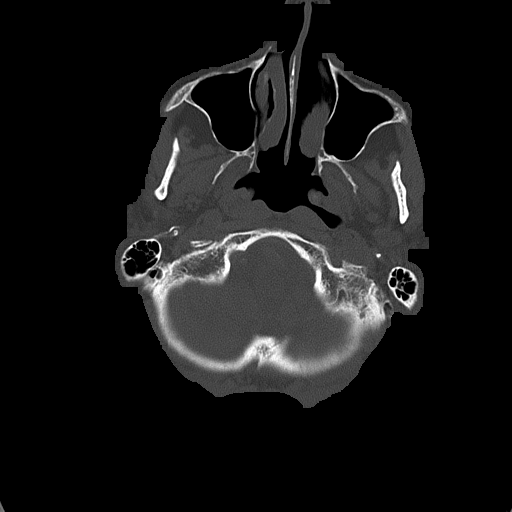
[im 16/76  bone]
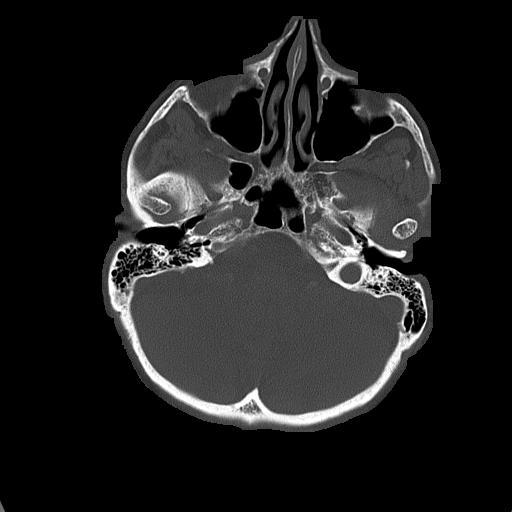
[im 23/76  bone]
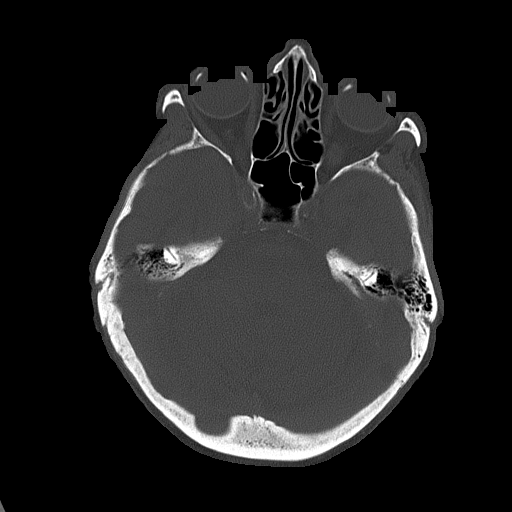
[im 34/76  bone]
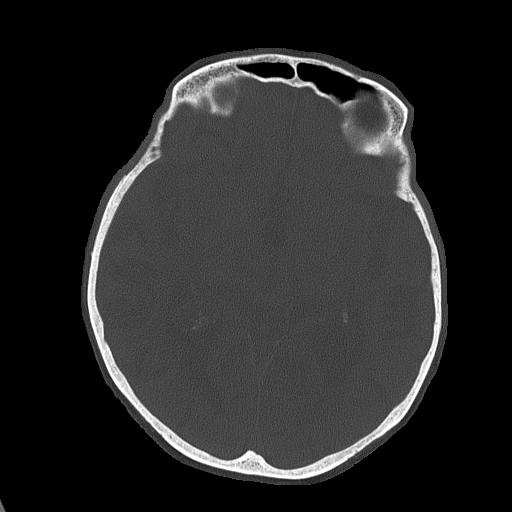

[Series 5: head without cor · coronal · non-contrast · 0.33mm/px · 3 of 67 slices shown]
[im 23/67  brain]
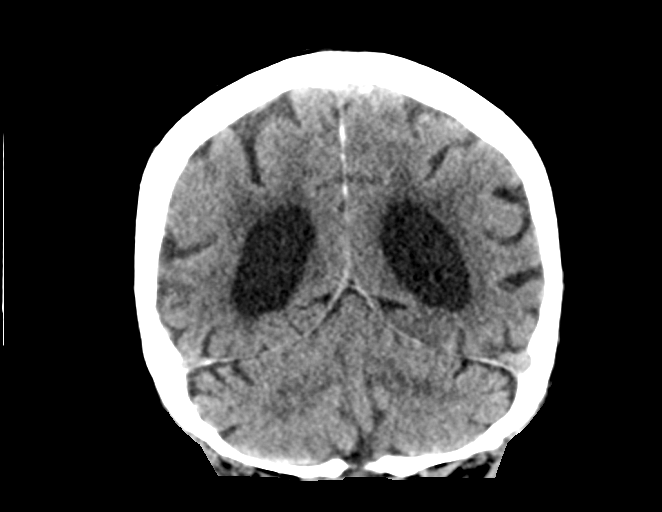
[im 30/67  brain]
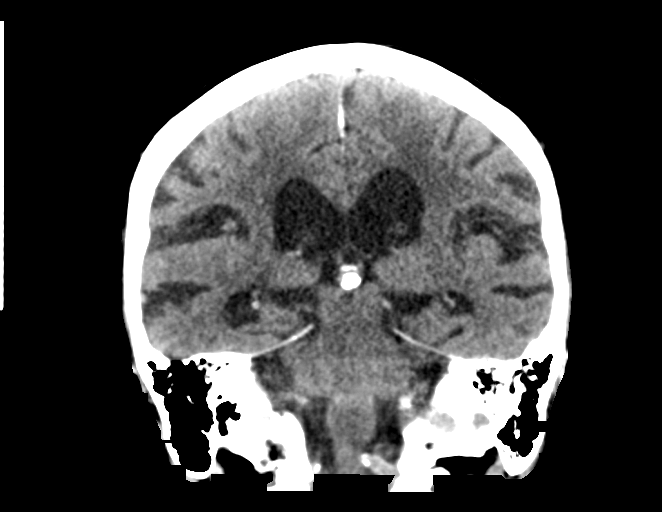
[im 37/67  brain]
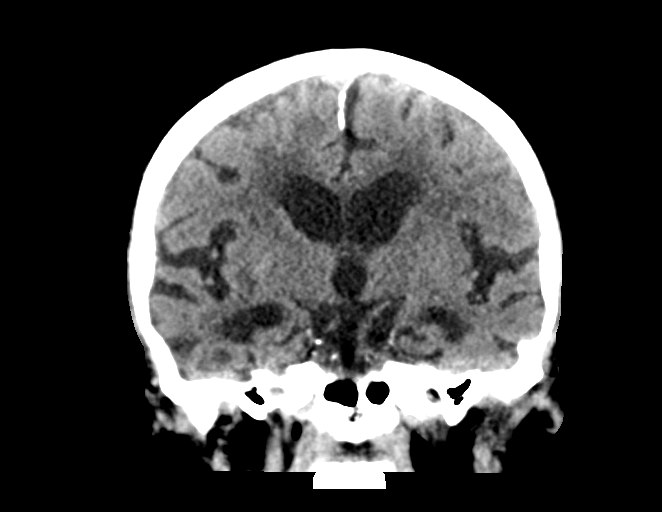

[Series 6: head without sag · sagittal · non-contrast · 0.36mm/px · 3 of 58 slices shown]
[im 20/58  brain]
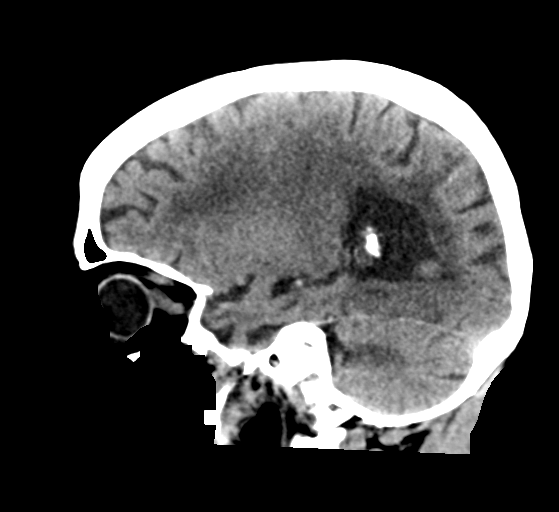
[im 29/58  brain]
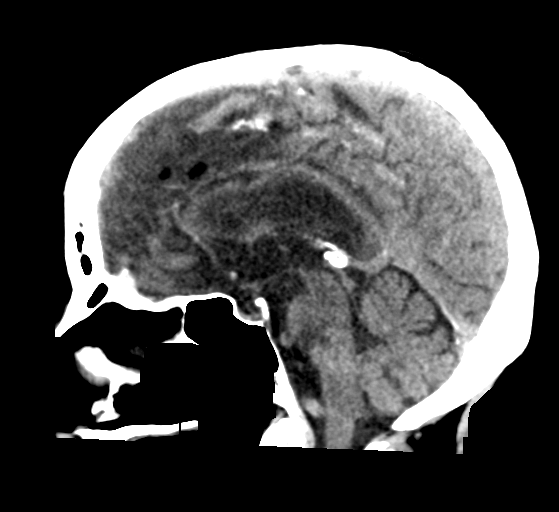
[im 39/58  brain]
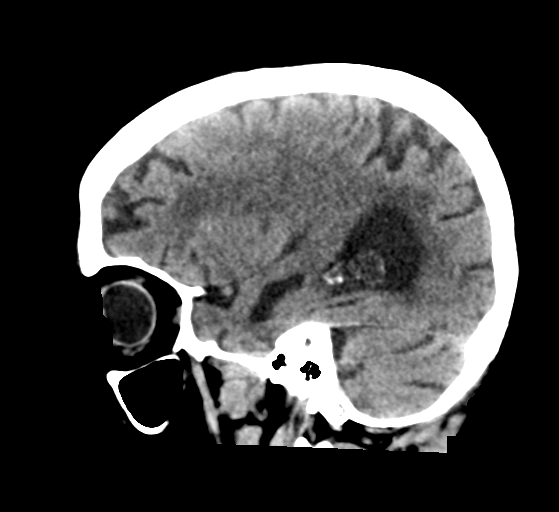

[17 of 47 positions shown; findings below may reference images not displayed]

FINDINGS: Brain: No evidence of acute infarction, hemorrhage, hydrocephalus,
extra-axial collection or mass lesion/mass effect. There is moderate
cerebral volume loss with associated ex vacuo dilatation.
Periventricular white matter hypoattenuation likely represents
chronic small vessel ischemic disease.

Vascular: There are vascular calcifications in the carotid siphons.

Skull: Normal. Negative for fracture or focal lesion.

Sinuses/Orbits: No acute finding.

Other: None.
IMPRESSION: No acute intracranial process.
